# Patient Record
Sex: Female | Born: 1955
Health system: Southern US, Community
[De-identification: ages and names within clinical notes are randomized; demographics above are authoritative.]

## PROBLEM LIST (undated history)

## (undated) DIAGNOSIS — M722 Plantar fascial fibromatosis: Secondary | ICD-10-CM

## (undated) DIAGNOSIS — R5382 Chronic fatigue, unspecified: Secondary | ICD-10-CM

## (undated) DIAGNOSIS — F32A Depression, unspecified: Secondary | ICD-10-CM

## (undated) DIAGNOSIS — F329 Major depressive disorder, single episode, unspecified: Secondary | ICD-10-CM

## (undated) DIAGNOSIS — R609 Edema, unspecified: Secondary | ICD-10-CM

## (undated) DIAGNOSIS — R278 Other lack of coordination: Secondary | ICD-10-CM

## (undated) DIAGNOSIS — F419 Anxiety disorder, unspecified: Secondary | ICD-10-CM

## (undated) DIAGNOSIS — Z87442 Personal history of urinary calculi: Secondary | ICD-10-CM

## (undated) DIAGNOSIS — R251 Tremor, unspecified: Secondary | ICD-10-CM

## (undated) DIAGNOSIS — G9332 Myalgic encephalomyelitis/chronic fatigue syndrome: Secondary | ICD-10-CM

## (undated) DIAGNOSIS — G4733 Obstructive sleep apnea (adult) (pediatric): Secondary | ICD-10-CM

## (undated) DIAGNOSIS — R350 Frequency of micturition: Secondary | ICD-10-CM

## (undated) DIAGNOSIS — K219 Gastro-esophageal reflux disease without esophagitis: Secondary | ICD-10-CM

## (undated) DIAGNOSIS — K3184 Gastroparesis: Secondary | ICD-10-CM

## (undated) DIAGNOSIS — M810 Age-related osteoporosis without current pathological fracture: Secondary | ICD-10-CM

## (undated) DIAGNOSIS — N200 Calculus of kidney: Secondary | ICD-10-CM

## (undated) DIAGNOSIS — G629 Polyneuropathy, unspecified: Secondary | ICD-10-CM

## (undated) DIAGNOSIS — Z9889 Other specified postprocedural states: Secondary | ICD-10-CM

## (undated) DIAGNOSIS — A0472 Enterocolitis due to Clostridium difficile, not specified as recurrent: Secondary | ICD-10-CM

## (undated) DIAGNOSIS — R32 Unspecified urinary incontinence: Secondary | ICD-10-CM

## (undated) DIAGNOSIS — M5416 Radiculopathy, lumbar region: Secondary | ICD-10-CM

## (undated) DIAGNOSIS — M797 Fibromyalgia: Secondary | ICD-10-CM

## (undated) DIAGNOSIS — E785 Hyperlipidemia, unspecified: Secondary | ICD-10-CM

## (undated) DIAGNOSIS — M6289 Other specified disorders of muscle: Secondary | ICD-10-CM

## (undated) DIAGNOSIS — R202 Paresthesia of skin: Secondary | ICD-10-CM

## (undated) DIAGNOSIS — M5136 Other intervertebral disc degeneration, lumbar region: Secondary | ICD-10-CM

## (undated) DIAGNOSIS — J189 Pneumonia, unspecified organism: Secondary | ICD-10-CM

## (undated) DIAGNOSIS — G2581 Restless legs syndrome: Secondary | ICD-10-CM

## (undated) DIAGNOSIS — Z92241 Personal history of systemic steroid therapy: Secondary | ICD-10-CM

## (undated) DIAGNOSIS — R112 Nausea with vomiting, unspecified: Secondary | ICD-10-CM

## (undated) DIAGNOSIS — D649 Anemia, unspecified: Secondary | ICD-10-CM

## (undated) HISTORY — DX: Plantar fascial fibromatosis: M72.2

## (undated) HISTORY — DX: Hyperlipidemia, unspecified: E78.5

## (undated) HISTORY — PX: KNEE ARTHROPLASTY: SHX992

## (undated) HISTORY — DX: Anemia, unspecified: D64.9

## (undated) HISTORY — DX: Depression, unspecified: F32.A

## (undated) HISTORY — DX: Other specified postprocedural states: Z98.890

## (undated) HISTORY — DX: Age-related osteoporosis without current pathological fracture: M81.0

## (undated) HISTORY — DX: Obstructive sleep apnea (adult) (pediatric): G47.33

## (undated) HISTORY — DX: Other intervertebral disc degeneration, lumbar region: M51.36

## (undated) HISTORY — DX: Pneumonia, unspecified organism: J18.9

## (undated) HISTORY — DX: Other lack of coordination: R27.8

## (undated) HISTORY — DX: Polyneuropathy, unspecified: G62.9

## (undated) HISTORY — DX: Tremor, unspecified: R25.1

## (undated) HISTORY — DX: Fibromyalgia: M79.7

## (undated) HISTORY — DX: Edema, unspecified: R60.9

## (undated) HISTORY — DX: Gastroparesis: K31.84

## (undated) HISTORY — DX: Other specified disorders of muscle: M62.89

## (undated) HISTORY — DX: Anxiety disorder, unspecified: F41.9

## (undated) HISTORY — PX: TONSILLECTOMY: SUR1361

## (undated) HISTORY — DX: Personal history of systemic steroid therapy: Z92.241

## (undated) HISTORY — DX: Major depressive disorder, single episode, unspecified: F32.9

## (undated) HISTORY — DX: Enterocolitis due to Clostridium difficile, not specified as recurrent: A04.72

## (undated) HISTORY — DX: Gastro-esophageal reflux disease without esophagitis: K21.9

## (undated) HISTORY — DX: Restless legs syndrome: G25.81

## (undated) HISTORY — DX: Paresthesia of skin: R20.2

---
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
## 1997-11-26 HISTORY — PX: KNEE SURGERY: SHX244

## 1998-08-02 ENCOUNTER — Ambulatory Visit (HOSPITAL_BASED_OUTPATIENT_CLINIC_OR_DEPARTMENT_OTHER): Admission: RE | Admit: 1998-08-02 | Discharge: 1998-08-02 | Payer: Self-pay | Admitting: Pediatrics

## 1999-04-17 ENCOUNTER — Encounter: Payer: Self-pay | Admitting: Emergency Medicine

## 1999-04-17 ENCOUNTER — Inpatient Hospital Stay (HOSPITAL_COMMUNITY): Admission: EM | Admit: 1999-04-17 | Discharge: 1999-04-18 | Payer: Self-pay | Admitting: Emergency Medicine

## 1999-09-04 ENCOUNTER — Encounter: Admission: RE | Admit: 1999-09-04 | Discharge: 1999-09-04 | Payer: Self-pay | Admitting: Unknown Physician Specialty

## 2000-01-19 ENCOUNTER — Other Ambulatory Visit: Admission: RE | Admit: 2000-01-19 | Discharge: 2000-01-19 | Payer: Self-pay | Admitting: Family Medicine

## 2000-02-12 ENCOUNTER — Encounter: Payer: Self-pay | Admitting: Family Medicine

## 2000-02-12 ENCOUNTER — Encounter: Admission: RE | Admit: 2000-02-12 | Discharge: 2000-02-12 | Payer: Self-pay | Admitting: Family Medicine

## 2000-10-17 ENCOUNTER — Emergency Department (HOSPITAL_COMMUNITY): Admission: EM | Admit: 2000-10-17 | Discharge: 2000-10-17 | Payer: Self-pay | Admitting: Emergency Medicine

## 2001-02-27 ENCOUNTER — Other Ambulatory Visit: Admission: RE | Admit: 2001-02-27 | Discharge: 2001-02-27 | Payer: Self-pay | Admitting: *Deleted

## 2001-02-28 ENCOUNTER — Encounter: Payer: Self-pay | Admitting: Family Medicine

## 2001-02-28 ENCOUNTER — Encounter: Admission: RE | Admit: 2001-02-28 | Discharge: 2001-02-28 | Payer: Self-pay | Admitting: Family Medicine

## 2001-03-03 ENCOUNTER — Encounter: Admission: RE | Admit: 2001-03-03 | Discharge: 2001-03-03 | Payer: Self-pay | Admitting: Family Medicine

## 2001-03-03 ENCOUNTER — Encounter: Payer: Self-pay | Admitting: Family Medicine

## 2001-08-05 ENCOUNTER — Ambulatory Visit (HOSPITAL_BASED_OUTPATIENT_CLINIC_OR_DEPARTMENT_OTHER): Admission: RE | Admit: 2001-08-05 | Discharge: 2001-08-05 | Payer: Self-pay | Admitting: Family Medicine

## 2001-09-21 ENCOUNTER — Ambulatory Visit (HOSPITAL_BASED_OUTPATIENT_CLINIC_OR_DEPARTMENT_OTHER): Admission: RE | Admit: 2001-09-21 | Discharge: 2001-09-21 | Payer: Self-pay | Admitting: Family Medicine

## 2002-03-25 ENCOUNTER — Other Ambulatory Visit: Admission: RE | Admit: 2002-03-25 | Discharge: 2002-03-25 | Payer: Self-pay | Admitting: *Deleted

## 2002-04-01 ENCOUNTER — Encounter: Payer: Self-pay | Admitting: *Deleted

## 2002-04-01 ENCOUNTER — Encounter: Admission: RE | Admit: 2002-04-01 | Discharge: 2002-04-01 | Payer: Self-pay | Admitting: *Deleted

## 2002-12-23 ENCOUNTER — Encounter: Payer: Self-pay | Admitting: Family Medicine

## 2002-12-23 ENCOUNTER — Ambulatory Visit (HOSPITAL_COMMUNITY): Admission: RE | Admit: 2002-12-23 | Discharge: 2002-12-23 | Payer: Self-pay | Admitting: Family Medicine

## 2002-12-24 ENCOUNTER — Encounter: Admission: RE | Admit: 2002-12-24 | Discharge: 2002-12-24 | Payer: Self-pay | Admitting: Family Medicine

## 2002-12-24 ENCOUNTER — Encounter: Payer: Self-pay | Admitting: Family Medicine

## 2002-12-25 ENCOUNTER — Ambulatory Visit (HOSPITAL_COMMUNITY): Admission: RE | Admit: 2002-12-25 | Discharge: 2002-12-25 | Payer: Self-pay | Admitting: Family Medicine

## 2002-12-25 ENCOUNTER — Encounter: Payer: Self-pay | Admitting: Family Medicine

## 2003-03-26 ENCOUNTER — Encounter: Payer: Self-pay | Admitting: Gastroenterology

## 2003-03-26 ENCOUNTER — Ambulatory Visit (HOSPITAL_COMMUNITY): Admission: RE | Admit: 2003-03-26 | Discharge: 2003-03-26 | Payer: Self-pay | Admitting: Gastroenterology

## 2003-03-27 ENCOUNTER — Encounter: Payer: Self-pay | Admitting: Gastroenterology

## 2003-03-27 ENCOUNTER — Inpatient Hospital Stay (HOSPITAL_COMMUNITY): Admission: EM | Admit: 2003-03-27 | Discharge: 2003-04-05 | Payer: Self-pay | Admitting: Emergency Medicine

## 2003-03-30 ENCOUNTER — Encounter: Payer: Self-pay | Admitting: Gastroenterology

## 2003-04-01 ENCOUNTER — Encounter: Payer: Self-pay | Admitting: Gastroenterology

## 2003-04-10 ENCOUNTER — Inpatient Hospital Stay (HOSPITAL_COMMUNITY): Admission: EM | Admit: 2003-04-10 | Discharge: 2003-04-12 | Payer: Self-pay

## 2003-04-10 ENCOUNTER — Encounter: Payer: Self-pay | Admitting: Gastroenterology

## 2003-05-02 ENCOUNTER — Inpatient Hospital Stay (HOSPITAL_COMMUNITY): Admission: EM | Admit: 2003-05-02 | Discharge: 2003-05-06 | Payer: Self-pay | Admitting: *Deleted

## 2003-05-10 ENCOUNTER — Inpatient Hospital Stay (HOSPITAL_COMMUNITY): Admission: AD | Admit: 2003-05-10 | Discharge: 2003-05-17 | Payer: Self-pay | Admitting: Gastroenterology

## 2003-05-14 ENCOUNTER — Encounter: Payer: Self-pay | Admitting: Gastroenterology

## 2003-05-27 ENCOUNTER — Inpatient Hospital Stay (HOSPITAL_COMMUNITY): Admission: AD | Admit: 2003-05-27 | Discharge: 2003-05-30 | Payer: Self-pay | Admitting: Gastroenterology

## 2003-07-03 ENCOUNTER — Inpatient Hospital Stay (HOSPITAL_COMMUNITY): Admission: EM | Admit: 2003-07-03 | Discharge: 2003-07-06 | Payer: Self-pay | Admitting: Emergency Medicine

## 2003-07-13 ENCOUNTER — Encounter: Payer: Self-pay | Admitting: Family Medicine

## 2003-07-13 ENCOUNTER — Encounter: Admission: RE | Admit: 2003-07-13 | Discharge: 2003-07-13 | Payer: Self-pay | Admitting: Family Medicine

## 2003-07-23 ENCOUNTER — Encounter: Payer: Self-pay | Admitting: Family Medicine

## 2003-07-23 ENCOUNTER — Encounter: Admission: RE | Admit: 2003-07-23 | Discharge: 2003-07-23 | Payer: Self-pay | Admitting: Family Medicine

## 2003-12-21 ENCOUNTER — Inpatient Hospital Stay (HOSPITAL_COMMUNITY): Admission: EM | Admit: 2003-12-21 | Discharge: 2003-12-24 | Payer: Self-pay | Admitting: *Deleted

## 2004-01-10 ENCOUNTER — Inpatient Hospital Stay (HOSPITAL_COMMUNITY): Admission: AD | Admit: 2004-01-10 | Discharge: 2004-01-18 | Payer: Self-pay | Admitting: Internal Medicine

## 2004-02-17 ENCOUNTER — Other Ambulatory Visit: Admission: RE | Admit: 2004-02-17 | Discharge: 2004-02-17 | Payer: Self-pay | Admitting: *Deleted

## 2004-02-21 ENCOUNTER — Encounter: Admission: RE | Admit: 2004-02-21 | Discharge: 2004-02-21 | Payer: Self-pay | Admitting: Family Medicine

## 2004-02-24 ENCOUNTER — Encounter: Admission: RE | Admit: 2004-02-24 | Discharge: 2004-02-24 | Payer: Self-pay | Admitting: Family Medicine

## 2004-04-10 ENCOUNTER — Emergency Department (HOSPITAL_COMMUNITY): Admission: EM | Admit: 2004-04-10 | Discharge: 2004-04-10 | Payer: Self-pay | Admitting: Emergency Medicine

## 2004-04-11 ENCOUNTER — Inpatient Hospital Stay (HOSPITAL_COMMUNITY): Admission: EM | Admit: 2004-04-11 | Discharge: 2004-04-16 | Payer: Self-pay | Admitting: Emergency Medicine

## 2004-07-27 HISTORY — PX: CHOLECYSTECTOMY: SHX55

## 2004-07-31 ENCOUNTER — Emergency Department (HOSPITAL_COMMUNITY): Admission: EM | Admit: 2004-07-31 | Discharge: 2004-08-01 | Payer: Self-pay | Admitting: Emergency Medicine

## 2004-08-09 ENCOUNTER — Encounter: Admission: RE | Admit: 2004-08-09 | Discharge: 2004-08-09 | Payer: Self-pay | Admitting: Family Medicine

## 2004-08-09 ENCOUNTER — Observation Stay (HOSPITAL_COMMUNITY): Admission: EM | Admit: 2004-08-09 | Discharge: 2004-08-10 | Payer: Self-pay | Admitting: Emergency Medicine

## 2004-08-09 ENCOUNTER — Encounter (INDEPENDENT_AMBULATORY_CARE_PROVIDER_SITE_OTHER): Payer: Self-pay | Admitting: Specialist

## 2005-02-14 ENCOUNTER — Encounter: Admission: RE | Admit: 2005-02-14 | Discharge: 2005-03-20 | Payer: Self-pay | Admitting: Family Medicine

## 2005-02-21 ENCOUNTER — Other Ambulatory Visit: Admission: RE | Admit: 2005-02-21 | Discharge: 2005-02-21 | Payer: Self-pay | Admitting: *Deleted

## 2005-04-16 ENCOUNTER — Ambulatory Visit (HOSPITAL_COMMUNITY): Admission: RE | Admit: 2005-04-16 | Discharge: 2005-04-16 | Payer: Self-pay | Admitting: Gastroenterology

## 2005-07-01 ENCOUNTER — Observation Stay (HOSPITAL_COMMUNITY): Admission: EM | Admit: 2005-07-01 | Discharge: 2005-07-01 | Payer: Self-pay | Admitting: Emergency Medicine

## 2005-07-03 ENCOUNTER — Ambulatory Visit: Payer: Self-pay | Admitting: Hematology and Oncology

## 2005-07-05 ENCOUNTER — Ambulatory Visit (HOSPITAL_COMMUNITY): Admission: RE | Admit: 2005-07-05 | Discharge: 2005-07-05 | Payer: Self-pay | Admitting: Hematology and Oncology

## 2005-10-25 ENCOUNTER — Ambulatory Visit: Payer: Self-pay | Admitting: Hematology and Oncology

## 2005-10-30 ENCOUNTER — Encounter: Admission: RE | Admit: 2005-10-30 | Discharge: 2005-10-30 | Payer: Self-pay | Admitting: Family Medicine

## 2005-10-31 ENCOUNTER — Encounter (INDEPENDENT_AMBULATORY_CARE_PROVIDER_SITE_OTHER): Payer: Self-pay | Admitting: *Deleted

## 2005-10-31 ENCOUNTER — Other Ambulatory Visit: Admission: RE | Admit: 2005-10-31 | Discharge: 2005-10-31 | Payer: Self-pay | Admitting: Hematology and Oncology

## 2006-02-26 ENCOUNTER — Ambulatory Visit: Payer: Self-pay | Admitting: Hematology and Oncology

## 2006-02-26 LAB — CBC WITH DIFFERENTIAL/PLATELET
BASO%: 0.7 % (ref 0.0–2.0)
Basophils Absolute: 0.1 10*3/uL (ref 0.0–0.1)
EOS%: 2.2 % (ref 0.0–7.0)
Eosinophils Absolute: 0.2 10*3/uL (ref 0.0–0.5)
HCT: 35.4 % (ref 34.8–46.6)
HGB: 11.7 g/dL (ref 11.6–15.9)
LYMPH%: 21.6 % (ref 14.0–48.0)
MCH: 27.1 pg (ref 26.0–34.0)
MCHC: 33 g/dL (ref 32.0–36.0)
MCV: 82.1 fL (ref 81.0–101.0)
MONO#: 0.7 10*3/uL (ref 0.1–0.9)
MONO%: 9.5 % (ref 0.0–13.0)
NEUT#: 4.9 10*3/uL (ref 1.5–6.5)
NEUT%: 66 % (ref 39.6–76.8)
Platelets: 473 10*3/uL — ABNORMAL HIGH (ref 145–400)
RBC: 4.32 10*6/uL (ref 3.70–5.32)
RDW: 18 % — ABNORMAL HIGH (ref 11.3–14.5)
WBC: 7.5 10*3/uL (ref 3.9–10.0)
lymph#: 1.6 10*3/uL (ref 0.9–3.3)

## 2006-04-09 ENCOUNTER — Other Ambulatory Visit: Admission: RE | Admit: 2006-04-09 | Discharge: 2006-04-09 | Payer: Self-pay | Admitting: *Deleted

## 2006-07-26 ENCOUNTER — Ambulatory Visit: Payer: Self-pay | Admitting: Hematology and Oncology

## 2006-07-31 LAB — CBC WITH DIFFERENTIAL/PLATELET
BASO%: 0.1 % (ref 0.0–2.0)
Basophils Absolute: 0 10*3/uL (ref 0.0–0.1)
EOS%: 1.5 % (ref 0.0–7.0)
Eosinophils Absolute: 0.2 10*3/uL (ref 0.0–0.5)
HCT: 38.3 % (ref 34.8–46.6)
HGB: 12.7 g/dL (ref 11.6–15.9)
LYMPH%: 18.4 % (ref 14.0–48.0)
MCH: 27.6 pg (ref 26.0–34.0)
MCHC: 33 g/dL (ref 32.0–36.0)
MCV: 83.6 fL (ref 81.0–101.0)
MONO#: 1 10*3/uL — ABNORMAL HIGH (ref 0.1–0.9)
MONO%: 9 % (ref 0.0–13.0)
NEUT#: 7.6 10*3/uL — ABNORMAL HIGH (ref 1.5–6.5)
NEUT%: 71 % (ref 39.6–76.8)
Platelets: 499 10*3/uL — ABNORMAL HIGH (ref 145–400)
RBC: 4.59 10*6/uL (ref 3.70–5.32)
RDW: 18.9 % — ABNORMAL HIGH (ref 11.3–14.5)
WBC: 10.6 10*3/uL — ABNORMAL HIGH (ref 3.9–10.0)
lymph#: 2 10*3/uL (ref 0.9–3.3)

## 2006-07-31 LAB — COMPREHENSIVE METABOLIC PANEL
ALT: 17 U/L (ref 0–40)
AST: 16 U/L (ref 0–37)
Albumin: 4.1 g/dL (ref 3.5–5.2)
Alkaline Phosphatase: 81 U/L (ref 39–117)
BUN: 21 mg/dL (ref 6–23)
CO2: 23 mEq/L (ref 19–32)
Calcium: 8.9 mg/dL (ref 8.4–10.5)
Chloride: 105 mEq/L (ref 96–112)
Creatinine, Ser: 0.81 mg/dL (ref 0.40–1.20)
Glucose, Bld: 98 mg/dL (ref 70–99)
Potassium: 3.9 mEq/L (ref 3.5–5.3)
Sodium: 140 mEq/L (ref 135–145)
Total Bilirubin: 0.1 mg/dL — ABNORMAL LOW (ref 0.3–1.2)
Total Protein: 7.4 g/dL (ref 6.0–8.3)

## 2006-08-09 ENCOUNTER — Ambulatory Visit (HOSPITAL_COMMUNITY): Admission: RE | Admit: 2006-08-09 | Discharge: 2006-08-09 | Payer: Self-pay | Admitting: Gynecology

## 2006-08-09 ENCOUNTER — Encounter (INDEPENDENT_AMBULATORY_CARE_PROVIDER_SITE_OTHER): Payer: Self-pay | Admitting: Specialist

## 2006-08-09 DIAGNOSIS — Z9889 Other specified postprocedural states: Secondary | ICD-10-CM

## 2006-08-09 HISTORY — PX: ENDOMETRIAL ABLATION: SHX621

## 2006-08-09 HISTORY — DX: Other specified postprocedural states: Z98.890

## 2006-12-26 ENCOUNTER — Ambulatory Visit: Payer: Self-pay | Admitting: Hematology and Oncology

## 2006-12-31 LAB — CBC WITH DIFFERENTIAL/PLATELET
BASO%: 0.4 % (ref 0.0–2.0)
Basophils Absolute: 0 10*3/uL (ref 0.0–0.1)
EOS%: 2 % (ref 0.0–7.0)
Eosinophils Absolute: 0.2 10*3/uL (ref 0.0–0.5)
HCT: 37.1 % (ref 34.8–46.6)
HGB: 12.8 g/dL (ref 11.6–15.9)
LYMPH%: 20.3 % (ref 14.0–48.0)
MCH: 29 pg (ref 26.0–34.0)
MCHC: 34.5 g/dL (ref 32.0–36.0)
MCV: 84.1 fL (ref 81.0–101.0)
MONO#: 0.7 10*3/uL (ref 0.1–0.9)
MONO%: 8 % (ref 0.0–13.0)
NEUT#: 6.3 10*3/uL (ref 1.5–6.5)
NEUT%: 69.3 % (ref 39.6–76.8)
Platelets: 401 10*3/uL — ABNORMAL HIGH (ref 145–400)
RBC: 4.41 10*6/uL (ref 3.70–5.32)
RDW: 15.9 % — ABNORMAL HIGH (ref 11.3–14.5)
WBC: 9.1 10*3/uL (ref 3.9–10.0)
lymph#: 1.8 10*3/uL (ref 0.9–3.3)

## 2007-01-01 LAB — BASIC METABOLIC PANEL
BUN: 11 mg/dL (ref 6–23)
CO2: 27 mEq/L (ref 19–32)
Calcium: 8.9 mg/dL (ref 8.4–10.5)
Chloride: 102 mEq/L (ref 96–112)
Creatinine, Ser: 0.84 mg/dL (ref 0.40–1.20)
Glucose, Bld: 90 mg/dL (ref 70–99)
Potassium: 3.6 mEq/L (ref 3.5–5.3)
Sodium: 141 mEq/L (ref 135–145)

## 2007-01-01 LAB — JAK2 GENOTYPR

## 2007-06-04 ENCOUNTER — Encounter (HOSPITAL_COMMUNITY): Admission: RE | Admit: 2007-06-04 | Discharge: 2007-08-25 | Payer: Self-pay | Admitting: Neurology

## 2007-11-05 ENCOUNTER — Other Ambulatory Visit: Admission: RE | Admit: 2007-11-05 | Discharge: 2007-11-05 | Payer: Self-pay | Admitting: Family Medicine

## 2008-01-23 ENCOUNTER — Emergency Department (HOSPITAL_COMMUNITY): Admission: EM | Admit: 2008-01-23 | Discharge: 2008-01-23 | Payer: Self-pay | Admitting: Emergency Medicine

## 2008-06-23 ENCOUNTER — Inpatient Hospital Stay (HOSPITAL_COMMUNITY): Admission: EM | Admit: 2008-06-23 | Discharge: 2008-06-25 | Payer: Self-pay | Admitting: Emergency Medicine

## 2008-06-26 ENCOUNTER — Inpatient Hospital Stay (HOSPITAL_COMMUNITY): Admission: EM | Admit: 2008-06-26 | Discharge: 2008-06-29 | Payer: Self-pay | Admitting: Emergency Medicine

## 2008-08-02 ENCOUNTER — Emergency Department (HOSPITAL_COMMUNITY): Admission: EM | Admit: 2008-08-02 | Discharge: 2008-08-02 | Payer: Self-pay | Admitting: Emergency Medicine

## 2008-08-17 ENCOUNTER — Encounter (INDEPENDENT_AMBULATORY_CARE_PROVIDER_SITE_OTHER): Payer: Self-pay | Admitting: Gastroenterology

## 2008-08-17 ENCOUNTER — Inpatient Hospital Stay (HOSPITAL_COMMUNITY): Admission: EM | Admit: 2008-08-17 | Discharge: 2008-08-24 | Payer: Self-pay | Admitting: Emergency Medicine

## 2008-08-21 ENCOUNTER — Ambulatory Visit: Payer: Self-pay | Admitting: Gastroenterology

## 2008-10-10 ENCOUNTER — Inpatient Hospital Stay (HOSPITAL_COMMUNITY): Admission: EM | Admit: 2008-10-10 | Discharge: 2008-10-14 | Payer: Self-pay | Admitting: Emergency Medicine

## 2008-10-20 ENCOUNTER — Observation Stay (HOSPITAL_COMMUNITY): Admission: EM | Admit: 2008-10-20 | Discharge: 2008-10-22 | Payer: Self-pay | Admitting: Emergency Medicine

## 2009-01-11 ENCOUNTER — Ambulatory Visit: Payer: Self-pay | Admitting: Gynecology

## 2009-01-11 ENCOUNTER — Encounter: Payer: Self-pay | Admitting: Gynecology

## 2009-01-11 ENCOUNTER — Other Ambulatory Visit: Admission: RE | Admit: 2009-01-11 | Discharge: 2009-01-11 | Payer: Self-pay | Admitting: Gynecology

## 2009-04-04 LAB — HM DEXA SCAN: HM Dexa Scan: LOW

## 2009-09-23 ENCOUNTER — Encounter: Admission: RE | Admit: 2009-09-23 | Discharge: 2009-09-23 | Payer: Self-pay | Admitting: Family Medicine

## 2009-12-14 ENCOUNTER — Inpatient Hospital Stay (HOSPITAL_COMMUNITY): Admission: EM | Admit: 2009-12-14 | Discharge: 2009-12-19 | Payer: Self-pay | Admitting: Emergency Medicine

## 2009-12-14 ENCOUNTER — Ambulatory Visit: Payer: Self-pay | Admitting: Cardiology

## 2010-01-30 DIAGNOSIS — G43909 Migraine, unspecified, not intractable, without status migrainosus: Secondary | ICD-10-CM | POA: Insufficient documentation

## 2010-01-30 DIAGNOSIS — F411 Generalized anxiety disorder: Secondary | ICD-10-CM | POA: Insufficient documentation

## 2010-01-30 DIAGNOSIS — K219 Gastro-esophageal reflux disease without esophagitis: Secondary | ICD-10-CM | POA: Insufficient documentation

## 2010-01-30 DIAGNOSIS — K3184 Gastroparesis: Secondary | ICD-10-CM | POA: Insufficient documentation

## 2010-02-17 ENCOUNTER — Ambulatory Visit: Payer: Self-pay | Admitting: Cardiology

## 2010-02-17 DIAGNOSIS — R072 Precordial pain: Secondary | ICD-10-CM | POA: Insufficient documentation

## 2010-10-26 ENCOUNTER — Encounter: Admission: RE | Admit: 2010-10-26 | Discharge: 2010-10-26 | Payer: Self-pay | Admitting: Family Medicine

## 2010-10-26 LAB — HM MAMMOGRAPHY: HM Mammogram: NORMAL

## 2010-12-17 ENCOUNTER — Encounter: Payer: Self-pay | Admitting: Gastroenterology

## 2010-12-26 NOTE — Assessment & Plan Note (Signed)
Summary: nph/post cath 12/19/09/lg   Primary Provider:  Clovis Riley, NP   History of Present Illness: The patient presents for followup after hospitalization earlier this year for evaluation of chest discomfort. At that time she had a negative cardiac CT and a catheterization demonstrating normal coronary arteries. Since that time she has had no further chest discomfort. Apparently her discomfort was related to her gastroparesis. She has started a walking regimen. With this she is having no symptoms. She denies any chest pressure, neck or arm discomfort. She has no shortness of breath, PND or orthopnea.  Current Medications (verified): 1)  Effexor Xr 150 Mg Xr24h-Cap (Venlafaxine Hcl) .Marland Kitchen.. 1 By Mouth Two Times A Day 2)  Nexium 40 Mg Cpdr (Esomeprazole Magnesium) .Marland Kitchen.. 1 Pod Aily 3)  Topamax 50 Mg Tabs (Topiramate) .Marland Kitchen.. 1 By Mouth At Bedtime 4)  Triamterene-Hctz 37.5-25 Mg Tabs (Triamterene-Hctz) .... 1/2 By Mouth Daily 5)  Lyrica 75 Mg Caps (Pregabalin) .Marland Kitchen.. 1 By Mouth Three Times A Day 6)  Requip 0.5 Mg Tabs (Ropinirole Hcl) .Marland Kitchen.. 1-3 Hours At Bedtime 7)  Provigil 200 Mg Tabs (Modafinil) .Marland Kitchen.. 1 By Mouth Daily 8)  Crestor 10 Mg Tabs (Rosuvastatin Calcium) .Marland Kitchen.. 1 By Mouth Daily 9)  Zofran 4 Mg Tabs (Ondansetron Hcl) .... Two Times A Day 10)  Domperidone 10mg  .... 4 By Mouth Daily 11)  Promethazine Hcl 25 Mg Tabs (Promethazine Hcl) .... As Needed 12)  Lorazepam 0.5 Mg Tabs (Lorazepam) .... As Needed 13)  Lunesta 3 Mg Tabs (Eszopiclone) .Marland Kitchen.. 1 By Mouth At Bedtime 14)  Vitamin D3 2000 Unit Caps (Cholecalciferol) .Marland Kitchen.. 1 Po Daily 15)  Lovaza 1 Gm Caps (Omega-3-Acid Ethyl Esters) .... 2 By Mouth Two Times A Day  Allergies (verified): 1)  ! Erythromycin  Past History:  Past Medical History: Reviewed history from 01/30/2010 and no changes required.  1. Gastroesophageal reflux disease.   2. Fibromyalgia.   3. Migraine.   4. Gastroparesis.   5. Anxiety.   Past Surgical History: Reviewed  history from 01/30/2010 and no changes required.  1. Left knee surgery.   2. Cholecystectomy.   3. Tonsillectomy.   Review of Systems       As stated in the HPI and negative for all other systems.   Vital Signs:  Patient profile:   55 year old female Height:      62 inches Weight:      209 pounds BMI:     38.36 Pulse rate:   101 / minute Resp:     18 per minute BP sitting:   128 / 86  (right arm)  Vitals Entered By: Marrion Coy, CNA (February 17, 2010 10:20 AM)  Physical Exam  General:  Well developed, well nourished, in no acute distress. Head:  normocephalic and atraumatic Eyes:  PERRLA/EOM intact; conjunctiva and lids normal. Mouth:  Teeth, gums and palate normal. Oral mucosa normal. Neck:  Neck supple, no JVD. No masses, thyromegaly or abnormal cervical nodes. Chest Wall:  no deformities or breast masses noted Lungs:  Clear bilaterally to auscultation and percussion. Heart:  Non-displaced PMI, chest non-tender; regular rate and rhythm, S1, S2 without murmurs, rubs or gallops. Carotid upstroke normal, no bruit. Normal abdominal aortic size, no bruits. Femorals normal pulses, no bruits. Pedals normal pulses. No edema, no varicosities. Abdomen:  Bowel sounds positive; abdomen soft and non-tender without masses, organomegaly, or hernias noted. No hepatosplenomegaly. Msk:  Back normal, normal gait. Muscle strength and tone normal. Extremities:  No clubbing or cyanosis.  Neurologic:  Alert and oriented x 3. Skin:  Intact without lesions or rashes. Psych:  Normal affect.   Impression & Recommendations:  Problem # 1:  PRECORDIAL PAIN (ICD-786.51) The patient has had no further chest discomfort. She has normal coronary arteries on catheterization. At this point no further cardiovascular testing is suggested. Orders: EKG w/ Interpretation (93000)  Problem # 2:  OVERWEIGHT (ICD-278.02) The patient and I discussed at length weight loss strategies.  Patient Instructions: 1)   Your physician recommends that you schedule a follow-up appointment as needed 2)  Your physician recommends that you continue on your current medications as directed. Please refer to the Current Medication list given to you today.

## 2011-01-16 ENCOUNTER — Other Ambulatory Visit (HOSPITAL_COMMUNITY)
Admission: RE | Admit: 2011-01-16 | Discharge: 2011-01-16 | Disposition: A | Discharge status: Home / Self Care | Payer: 59 | Source: Ambulatory Visit | Attending: Family Medicine | Admitting: Family Medicine

## 2011-01-16 ENCOUNTER — Other Ambulatory Visit: Payer: Self-pay | Admitting: Family Medicine

## 2011-01-16 DIAGNOSIS — Z124 Encounter for screening for malignant neoplasm of cervix: Secondary | ICD-10-CM | POA: Insufficient documentation

## 2011-01-16 LAB — HM PAP SMEAR: HM Pap smear: NORMAL

## 2011-02-11 LAB — COMPREHENSIVE METABOLIC PANEL
ALT: 37 U/L — ABNORMAL HIGH (ref 0–35)
ALT: 39 U/L — ABNORMAL HIGH (ref 0–35)
AST: 23 U/L (ref 0–37)
AST: 71 U/L — ABNORMAL HIGH (ref 0–37)
Albumin: 3.1 g/dL — ABNORMAL LOW (ref 3.5–5.2)
Albumin: 3.3 g/dL — ABNORMAL LOW (ref 3.5–5.2)
Alkaline Phosphatase: 85 U/L (ref 39–117)
Alkaline Phosphatase: 97 U/L (ref 39–117)
BUN: 13 mg/dL (ref 6–23)
BUN: 9 mg/dL (ref 6–23)
CO2: 24 mEq/L (ref 19–32)
CO2: 27 mEq/L (ref 19–32)
Calcium: 8.4 mg/dL (ref 8.4–10.5)
Calcium: 8.7 mg/dL (ref 8.4–10.5)
Chloride: 104 mEq/L (ref 96–112)
Chloride: 108 mEq/L (ref 96–112)
Creatinine, Ser: 0.82 mg/dL (ref 0.4–1.2)
Creatinine, Ser: 0.83 mg/dL (ref 0.4–1.2)
GFR calc Af Amer: >60 mL/min (ref >60)
GFR calc Af Amer: >60 mL/min (ref >60)
GFR calc non Af Amer: >60 mL/min (ref >60)
GFR calc non Af Amer: >60 mL/min (ref >60)
Glucose, Bld: 129 mg/dL — ABNORMAL HIGH (ref 70–99)
Glucose, Bld: 90 mg/dL (ref 70–99)
Potassium: 3.5 mEq/L (ref 3.5–5.1)
Potassium: 5.8 mEq/L — ABNORMAL HIGH (ref 3.5–5.1)
Sodium: 138 mEq/L (ref 135–145)
Sodium: 139 mEq/L (ref 135–145)
Total Bilirubin: 0.3 mg/dL (ref 0.3–1.2)
Total Bilirubin: 1.3 mg/dL — ABNORMAL HIGH (ref 0.3–1.2)
Total Protein: 6.4 g/dL (ref 6.0–8.3)
Total Protein: 6.5 g/dL (ref 6.0–8.3)

## 2011-02-11 LAB — CARDIAC PANEL(CRET KIN+CKTOT+MB+TROPI)
CK, MB: 1.4 ng/mL (ref 0.3–4.0)
CK, MB: 1.4 ng/mL (ref 0.3–4.0)
CK, MB: 1.6 ng/mL (ref 0.3–4.0)
Relative Index: INVALID (ref 0.0–2.5)
Relative Index: INVALID (ref 0.0–2.5)
Relative Index: INVALID (ref 0.0–2.5)
Total CK: 62 U/L (ref 7–177)
Total CK: 65 U/L (ref 7–177)
Total CK: 82 U/L (ref 7–177)
Troponin I: 0.02 ng/mL (ref 0.00–0.06)
Troponin I: 0.02 ng/mL (ref 0.00–0.06)
Troponin I: 0.04 ng/mL (ref 0.00–0.06)

## 2011-02-11 LAB — BASIC METABOLIC PANEL
BUN: 10 mg/dL (ref 6–23)
BUN: 11 mg/dL (ref 6–23)
BUN: 12 mg/dL (ref 6–23)
BUN: 13 mg/dL (ref 6–23)
CO2: 25 mEq/L (ref 19–32)
CO2: 27 mEq/L (ref 19–32)
CO2: 29 mEq/L (ref 19–32)
CO2: 31 mEq/L (ref 19–32)
Calcium: 8.1 mg/dL — ABNORMAL LOW (ref 8.4–10.5)
Calcium: 8.3 mg/dL — ABNORMAL LOW (ref 8.4–10.5)
Calcium: 8.3 mg/dL — ABNORMAL LOW (ref 8.4–10.5)
Calcium: 8.3 mg/dL — ABNORMAL LOW (ref 8.4–10.5)
Chloride: 102 mEq/L (ref 96–112)
Chloride: 104 mEq/L (ref 96–112)
Chloride: 105 mEq/L (ref 96–112)
Chloride: 110 mEq/L (ref 96–112)
Creatinine, Ser: 0.75 mg/dL (ref 0.4–1.2)
Creatinine, Ser: 0.79 mg/dL (ref 0.4–1.2)
Creatinine, Ser: 0.82 mg/dL (ref 0.4–1.2)
Creatinine, Ser: 0.93 mg/dL (ref 0.4–1.2)
GFR calc Af Amer: >60 mL/min (ref >60)
GFR calc Af Amer: >60 mL/min (ref >60)
GFR calc Af Amer: >60 mL/min (ref >60)
GFR calc Af Amer: >60 mL/min (ref >60)
GFR calc non Af Amer: >60 mL/min (ref >60)
GFR calc non Af Amer: >60 mL/min (ref >60)
GFR calc non Af Amer: >60 mL/min (ref >60)
GFR calc non Af Amer: >60 mL/min (ref >60)
Glucose, Bld: 109 mg/dL — ABNORMAL HIGH (ref 70–99)
Glucose, Bld: 113 mg/dL — ABNORMAL HIGH (ref 70–99)
Glucose, Bld: 90 mg/dL (ref 70–99)
Glucose, Bld: 99 mg/dL (ref 70–99)
Potassium: 3.6 mEq/L (ref 3.5–5.1)
Potassium: 3.7 mEq/L (ref 3.5–5.1)
Potassium: 3.8 mEq/L (ref 3.5–5.1)
Potassium: 4.1 mEq/L (ref 3.5–5.1)
Sodium: 138 mEq/L (ref 135–145)
Sodium: 140 mEq/L (ref 135–145)
Sodium: 141 mEq/L (ref 135–145)
Sodium: 142 mEq/L (ref 135–145)

## 2011-02-11 LAB — TROPONIN I: Troponin I: 0.01 ng/mL (ref 0.00–0.06)

## 2011-02-11 LAB — URINALYSIS, ROUTINE W REFLEX MICROSCOPIC
Bilirubin Urine: NEGATIVE
Glucose, UA: NEGATIVE mg/dL
Hgb urine dipstick: NEGATIVE
Ketones, ur: NEGATIVE mg/dL
Nitrite: NEGATIVE
Protein, ur: NEGATIVE mg/dL
Specific Gravity, Urine: >1.046 — ABNORMAL HIGH (ref 1.005–1.030)
Urobilinogen, UA: 0.2 mg/dL (ref 0.0–1.0)
pH: 5.5 (ref 5.0–8.0)

## 2011-02-11 LAB — CBC
HCT: 38.9 % (ref 36.0–46.0)
HCT: 40.8 % (ref 36.0–46.0)
Hemoglobin: 13.4 g/dL (ref 12.0–15.0)
Hemoglobin: 13.6 g/dL (ref 12.0–15.0)
MCHC: 33.3 g/dL (ref 30.0–36.0)
MCHC: 34.3 g/dL (ref 30.0–36.0)
MCV: 88.8 fL (ref 78.0–100.0)
MCV: 90 fL (ref 78.0–100.0)
Platelets: 345 10*3/uL (ref 150–400)
Platelets: 388 10*3/uL (ref 150–400)
RBC: 4.38 MIL/uL (ref 3.87–5.11)
RBC: 4.54 MIL/uL (ref 3.87–5.11)
RDW: 14.7 % (ref 11.5–15.5)
RDW: 14.8 % (ref 11.5–15.5)
WBC: 11.6 10*3/uL — ABNORMAL HIGH (ref 4.0–10.5)
WBC: 12.2 10*3/uL — ABNORMAL HIGH (ref 4.0–10.5)

## 2011-02-11 LAB — HEPATIC FUNCTION PANEL
ALT: 60 U/L — ABNORMAL HIGH (ref 0–35)
AST: 54 U/L — ABNORMAL HIGH (ref 0–37)
Albumin: 3.2 g/dL — ABNORMAL LOW (ref 3.5–5.2)
Alkaline Phosphatase: 103 U/L (ref 39–117)
Bilirubin, Direct: 0.2 mg/dL (ref 0.0–0.3)
Indirect Bilirubin: 0.4 mg/dL (ref 0.3–0.9)
Total Bilirubin: 0.6 mg/dL (ref 0.3–1.2)
Total Protein: 6.7 g/dL (ref 6.0–8.3)

## 2011-02-11 LAB — POCT CARDIAC MARKERS
CKMB, poc: 1.1 ng/mL (ref 1.0–8.0)
CKMB, poc: 1.5 ng/mL (ref 1.0–8.0)
Myoglobin, poc: 37.5 ng/mL (ref 12–200)
Myoglobin, poc: 41.5 ng/mL (ref 12–200)
Troponin i, poc: <0.05 ng/mL (ref 0.00–0.09)
Troponin i, poc: <0.05 ng/mL (ref 0.00–0.09)

## 2011-02-11 LAB — LIPID PANEL
Cholesterol: 194 mg/dL (ref 0–200)
HDL: 55 mg/dL (ref >39)
LDL Cholesterol: 120 mg/dL — ABNORMAL HIGH (ref 0–99)
Total CHOL/HDL Ratio: 3.5 RATIO
Triglycerides: 96 mg/dL (ref <150)
VLDL: 19 mg/dL (ref 0–40)

## 2011-02-11 LAB — LIPASE, BLOOD: Lipase: 27 U/L (ref 11–59)

## 2011-02-11 LAB — CK TOTAL AND CKMB (NOT AT ARMC)
CK, MB: 1.3 ng/mL (ref 0.3–4.0)
Relative Index: INVALID (ref 0.0–2.5)
Total CK: 35 U/L (ref 7–177)

## 2011-02-11 LAB — POTASSIUM: Potassium: 3.8 mEq/L (ref 3.5–5.1)

## 2011-02-11 LAB — APTT: aPTT: 34 seconds (ref 24–37)

## 2011-02-11 LAB — PROTIME-INR
INR: 1 (ref 0.00–1.49)
Prothrombin Time: 13.1 seconds (ref 11.6–15.2)

## 2011-02-11 LAB — DIFFERENTIAL
Basophils Absolute: 0 10*3/uL (ref 0.0–0.1)
Basophils Relative: 0 % (ref 0–1)
Eosinophils Absolute: 0.2 10*3/uL (ref 0.0–0.7)
Eosinophils Relative: 1 % (ref 0–5)
Lymphocytes Relative: 18 % (ref 12–46)
Lymphs Abs: 2.1 10*3/uL (ref 0.7–4.0)
Monocytes Absolute: 1 10*3/uL (ref 0.1–1.0)
Monocytes Relative: 9 % (ref 3–12)
Neutro Abs: 8.3 10*3/uL — ABNORMAL HIGH (ref 1.7–7.7)
Neutrophils Relative %: 72 % (ref 43–77)

## 2011-02-11 LAB — TSH: TSH: 2.128 u[IU]/mL (ref 0.350–4.500)

## 2011-02-25 DIAGNOSIS — M722 Plantar fascial fibromatosis: Secondary | ICD-10-CM

## 2011-02-25 HISTORY — DX: Plantar fascial fibromatosis: M72.2

## 2011-03-28 ENCOUNTER — Institutional Professional Consult (permissible substitution) (INDEPENDENT_AMBULATORY_CARE_PROVIDER_SITE_OTHER): Payer: 59 | Admitting: Family Medicine

## 2011-03-28 DIAGNOSIS — E7800 Pure hypercholesterolemia, unspecified: Secondary | ICD-10-CM

## 2011-03-28 DIAGNOSIS — IMO0001 Reserved for inherently not codable concepts without codable children: Secondary | ICD-10-CM

## 2011-03-28 DIAGNOSIS — K3184 Gastroparesis: Secondary | ICD-10-CM

## 2011-04-02 ENCOUNTER — Ambulatory Visit (INDEPENDENT_AMBULATORY_CARE_PROVIDER_SITE_OTHER): Payer: 59 | Admitting: Family Medicine

## 2011-04-02 DIAGNOSIS — J309 Allergic rhinitis, unspecified: Secondary | ICD-10-CM

## 2011-04-02 DIAGNOSIS — J069 Acute upper respiratory infection, unspecified: Secondary | ICD-10-CM

## 2011-04-06 ENCOUNTER — Telehealth: Payer: Self-pay | Admitting: Family Medicine

## 2011-04-06 NOTE — Telephone Encounter (Signed)
I called and spoke to pt.  She has had no productive sputum, just chills, cough, chest no worse congested , but a little SOB.  No hx/o lung or heart disease, non smoker.  She is using OTC Mucinex DM.  She has not started antibiotic.  I advised if worse chest congestion or sputum, begin antibiotic prescribed by Dr. Lynelle Doctor, hydrate well, rest, c/t Mucinex DM.  If SOB worsens, return or go to Urgent Care or Emergency Dept.

## 2011-04-06 NOTE — Telephone Encounter (Signed)
SAW KNAPP MON. 5/7. GIVEN SCRIPT FOR AMOXICILLIN TO TAKE ONLY IF PHLEGM/MUCUS START TO GET YELLOW. IT HAS NOT HOWEVER SHE HAD CHILLS TO THE POINT OF CHATTERING TEETH, SWEATING. FELT LIKE FLU SO TOOK THERAFLU. NOW EXTREMELY SHORT OF BREATH AND KEEP GETTING CHILLS AND SWEATING. SHOULD SHE START THE AMOXICILLIN NOW FOR THESE SYMPTOMS?

## 2011-04-07 ENCOUNTER — Inpatient Hospital Stay (INDEPENDENT_AMBULATORY_CARE_PROVIDER_SITE_OTHER)
Admission: RE | Admit: 2011-04-07 | Discharge: 2011-04-07 | Disposition: A | Discharge status: Home / Self Care | Payer: 59 | Source: Ambulatory Visit | Attending: Emergency Medicine | Admitting: Emergency Medicine

## 2011-04-07 ENCOUNTER — Ambulatory Visit (INDEPENDENT_AMBULATORY_CARE_PROVIDER_SITE_OTHER): Payer: 59

## 2011-04-07 DIAGNOSIS — J189 Pneumonia, unspecified organism: Secondary | ICD-10-CM

## 2011-04-09 ENCOUNTER — Encounter: Payer: Self-pay | Admitting: Medical

## 2011-04-09 ENCOUNTER — Ambulatory Visit (INDEPENDENT_AMBULATORY_CARE_PROVIDER_SITE_OTHER): Payer: 59 | Admitting: Medical

## 2011-04-09 VITALS — BP 110/72 | HR 103 | Ht 62.5 in | Wt 211.0 lb

## 2011-04-09 DIAGNOSIS — J189 Pneumonia, unspecified organism: Secondary | ICD-10-CM

## 2011-04-09 DIAGNOSIS — R7301 Impaired fasting glucose: Secondary | ICD-10-CM

## 2011-04-09 DIAGNOSIS — E860 Dehydration: Secondary | ICD-10-CM

## 2011-04-09 DIAGNOSIS — R0602 Shortness of breath: Secondary | ICD-10-CM

## 2011-04-09 LAB — POCT GLYCOSYLATED HEMOGLOBIN (HGB A1C): Hemoglobin A1C: 5.6

## 2011-04-09 MED ORDER — PREDNISONE (PAK) 10 MG PO TABS: 10.0000 mg | ORAL_TABLET | Freq: Every day | ORAL | Status: AC

## 2011-04-09 MED ORDER — ALBUTEROL SULFATE (2.5 MG/3ML) 0.083% IN NEBU: 2.5000 mg | INHALATION_SOLUTION | Freq: Four times a day (QID) | RESPIRATORY_TRACT | Status: DC | PRN

## 2011-04-09 MED ORDER — COMPRESSOR/NEBULIZER MISC: Status: DC

## 2011-04-09 NOTE — Progress Notes (Signed)
Subjective:   HPI Here for recheck. Was seen here on 04/02/11 for cough. Over the weekend was having increased shortness of breath, went to urgent care, diagnosed with pneumonia. She was given a nebulizer treatment at urgent care with some improvement. Was sent home with a short course of prednisone 50 mg one tablet daily for 2 days then one half tablet daily for 2 days, albuterol HFA, and doxycycline 100 mg twice a day for 7 days. She is not using the prednisone correctly, nevertheless she does not feel much improved yet, is fatigued, coughing a lot, has had minimal improvement. She is here with her husband today. No other new complaints.   Review of Systems Constitutional: Positive for fatigue, chills; denies fever, sweats, unexpected weight change Dermatology: denies rash  ENT: Positive for chest congestion; no runny nose, ear pain, sore throat, hoarseness, sinus pain Cardiology: denies chest pain, palpitations, edema, orthopnea, paroxysmal nocturnal dyspnea Respiratory: Positive for cough, shortness of breath, dyspnea on exertion, wheezing; no hemoptysis Gastroenterology: Positive for nausea to make a denies abdominal pain, vomiting, diarrhea Hematology: denies bleeding or bruising problems Musculoskeletal: denies arthralgias, myalgias, joint swelling, back pain, neck pain, cramping, gait changes Urology: denies dysuria, difficulty urinating, hematuria, urinary frequency, urgency Neurology: no headache, weakness, tingling, numbness     Objective:   Physical Exam  General appearance: alert, no distress, WD/WN, female , overweight, fatigued-appearing  HEENT: normocephalic, conjunctiva/corneas normal, sclerae anicteric, PERRLA, EOMi, nares patent, no discharge or erythema, pharynx normal Oral cavity: Somewhat dry mucous membranes, tongue normal, teeth normal Neck: supple, no lymphadenopathy, no thyromegaly, no masses Heart: RRR, normal S1, S2, no murmurs Lungs: Decreased breath sounds,  positive scattered rhonchi; otherwise CTA bilaterally, no wheezes, or rales Extremities: no edema, no cyanosis, no clubbing Pulses: 2+ symmetric, upper and lower extremities   Assessment & Plan:     Pneumonia-continue doxycycline, gave her a prescription for prednisone today, rest, discussed the usual course of pneumonia, symptoms that would prompt hospitalization, treatment, timeframe for recovery. Reviewed urgent care documents that she brought. Gave one round of albuterol nebulized treatment in the office with improvement. With little activity of walking she did not desaturate to less than 96%.  Shortness of breath-new prescription for albuterol nebulizer, advise rest, if not improving call or return.  Dehydration-discussed importance of hydration , advised if she drink 20 ounces of liquids, water or Gatorade until urine is clear  Call or return if not improved or worse in the next one to 2 days. Recheck in one week.

## 2011-04-09 NOTE — Patient Instructions (Addendum)
Pneumonia Pneumonia is an infection of the lungs. It may be caused by a bacteria or virus. Most forms are bacterial. Usually, these infections are caused by breathing infectious particles into the lungs (respiratory tract). SYMPTOMS  The most common problems (symptoms) are:   Cough.  Fever.   Chest pain.  Increased rate of breathing.   Wheezing.  Mucus production.   DIAGNOSIS  Often these infections are diagnosed on exam by your caregiver. Sometimes the diagnosis may require:   Chest X-rays.   Blood analysis.   Cultures. Blood cultures may be done to help find the cause of your pneumonia.  Your caregiver may do tests (blood gasses or pulse oximetry) to see how well your lungs are working. TREATMENT The bacterial pneumonias generally respond well to medicines (antibiotics) that kill germs. Viral infections must run their course. These infections will not respond to antibiotics. A pneumococcal shot (vaccine) is available to prevent a common bacterial pneumonia. This is usually suggested for the elderly and for other groups of higher risk individuals, such as those on chemotherapy or those who have problems with their immune system.  You will have pneumococcal screening or vaccination if you are over 22 years old and are not immunized.   If you are a smoker, it is time to quit. You may receive instructions on how to best stop smoking. Your caregiver can provide medicines and counseling to help you quit.  HOME CARE INSTRUCTIONS  Cough suppressants may be used if you are losing too much rest. However, coughing protects you by clearing your lungs. This is one reason for not using cough suppressants, if able, as they take away this protection.   Your caregiver may have prescribed an antibiotic if she or he feels your cough is caused by a bacterial infection. Take all your medicine until you are finished.   Your caregiver may also prescribe an expectorant to loosen the mucus to be coughed  up.   Only take over-the-counter or prescription medicines for pain, discomfort, or fever as directed by your caregiver.   Smoking is a common cause of bronchitis and can contribute to pneumonia. Stopping this habit is an important self-help step.   If you are a smoker and continue to smoke, your cough may last several weeks after your pneumonia has cleared.   A cold steam vaporizer or humidifier in your room or home may help loosen mucus.   Coughing is often worse at night. Sleeping in a semi-upright position in a recliner or using a couple pillows under your head will help with this.   Get rest as you feel it is needed. Your body will usually let you know when to rest.  SEEK IMMEDIATE MEDICAL CARE IF:  You develop pus-like mucus (sputum) or your illness becomes worse. This is especially true if you are elderly or weakened from any other disease.   You cannot control your cough with suppressants and are losing sleep.   You begin coughing up blood.   You develop pain which is getting worse or is uncontrolled with medicines.   You or your child has an oral temperature above 101, not controlled by medicine.   Any of the symptoms which initially brought you in for treatment are getting worse rather than better.   You develop shortness of breath or chest pain.  MAKE SURE YOU:   Understand these instructions.   Will watch your condition.   Will get help right away if you are not doing well or  get worse.  Document Released: 11/12/2005 Document Re-Released: 05/02/2010 [facility] Patient Information 2011 [place], [place].     Dehydration Dehydration is the reduction of water and fluid from the body to a level below that required for proper functioning. CAUSES Dehydration occurs when there is excessive fluid loss from the body or when loss of normal fluids is not adequately replaced.  Loss of fluids occurs in vomiting, diarrhea, excessive sweating, excessive urine output, or  excessive loss of fluid from the lungs (as occurs in fever or in patients on a ventilator).   Inadequate fluid replacement occurs with nausea or decreased appetite due to illness, sore throat, or mouth pain.  SYMPTOMS Mild dehydration  Thirst (infants and young children may not be able to tell you they are thirsty).   Dry lips.   Slightly dry mouth membranes.  Moderate dehydration  Very dry mouth membranes.   Sunken eyes.   Sunken soft spot (fontanelle) on infant's head.   Skin does not bounce back quickly when lightly pinched and released.   Decreased urine production.   Decreased tear production.  Severe dehydration  Rapid, weak pulse (more than 100 beats per minute at rest).   Cold hands and feet.   Loss of ability to sweat in spite of heat and temperature.   Rapid breathing.   Blue lips.   Confusion, lethargy, difficult to arouse.   Minimal urine production.   No tears.  DIAGNOSIS Your caregiver will diagnose dehydration based on your symptoms and your exam. Blood and urine tests will help confirm the diagnosis. The diagnostic evaluation should also identify the cause of dehydration. PREVENTION The body depends on a proper balance of fluid and salts (electrolytes) for normal function. Adequate fluid intake in the presence of illness or other stresses (such as extreme exercise) is important.  TREATMENT  Mild dehydration is safe to self-treat for most ages as long as it does not worsen. Contact your caregiver for even mild dehydration in infants and the elderly.   In teenagers and adults with moderate dehydration, careful home treatment (as outlined below) can be safe. Phone contact with a caregiver is advised. Children under 45 years of age with moderate dehydration should see a caregiver.   If you or your child is severely dehydrated, go to a hospital for treatment. Intravenous (IV) fluids will quickly reverse dehydration and are often lifesaving in young  children, infants, and elderly persons.  HOME CARE INSTRUCTIONS Small amounts of fluids should be taken frequently. Large amounts at one time may not be tolerated. Plain water may be harmful in infants and the elderly. Oral rehydration solutions (ORS) are available at pharmacies and grocery stores. ORS replaces water and important electrolytes in proper proportions. Sports drinks are not as effective as ORS and may be harmful because the sugar can make diarrhea worse.  As a general guideline for children, replace any new fluid losses from diarrhea and/or vomiting with ORS as follows:   If your child weighs 22 pounds or under (10 kg or less), give 60-120 mL (1/4-1/2 cup or 2-4 ounces) of ORS for each diarrheal stool or vomiting episode.   If your child weighs more than 22 pounds (more than 10 kg), give 120-240 mL (1/2-1 cup or 4-8 ounces) of ORS for each diarrheal stool or vomiting episode.  If your child is vomiting, it may be helpful to give the above ORS replacement in 5 mL (1 teaspoon) amounts every 5 minutes and increase as tolerated.   While correcting  for dehydration, children should eat normally. However, foods high in sugar should be avoided because they may worsen diarrhea. Large amounts of carbonated soft drinks, juice, gelatin desserts, and other highly sugared drinks should be avoided.   After correction of dehydration, other liquids that are appealing to the child may be added. Children should drink small amounts of fluids frequently and fluids should be increased as tolerated. Children should drink enough fluids to keep urine clear or pale yellow.  Adults should eat normally while drinking more fluids than usual. Drink small amounts of fluids frequently and increase the amount as tolerated. Drink enough fluids to keep urine clear or pale yellow. Broths, weak decaffeinated tea, lemon-lime soft drinks (allowed to go flat), and ORS replace fluids and electrolytes.  Avoid:  Carbonated  drinks.  Juice.   Extremely hot or cold fluids.   Caffeine drinks.   Fatty, greasy foods.   Alcohol.  Tobacco.   Too much intake of anything at one time.   Gelatin desserts.    Probiotics are active cultures of beneficial bacteria. They may lessen the amount and number of diarrheal stools in adults. Probiotics can be found in yogurt with active cultures and in supplements.   Wash your hands well to avoid spreading germs (bacteria) and viruses.   Antidiarrheal medicines are not recommended for infants and children.   Only take over-the-counter or prescription medicines for pain, discomfort, or fever as directed by your caregiver. Do not give aspirin to children.   For adults with dehydration, ask your caregiver if you should continue all prescribed and over-the-counter medicines.   If your caregiver has given you a follow-up appointment, it is very important to keep that appointment. Not keeping the appointment could result in a lasting (chronic) or permanent injury and disability. If there is any problem keeping the appointment, you must call to reschedule.  SEEK IMMEDIATE MEDICAL CARE IF:  You are unable to keep fluids down or other symptoms become worse despite treatment.   Vomiting or diarrhea develops and becomes persistent.   There is vomiting of blood or green matter (bile).   There is blood in the stool or the stools are black and tarry.   There is no urine output in 6 to 8 hours or there is only a small amount of very dark urine.   Abdominal pain develops, increases, or localizes.   You or your child has an oral temperature above 101 not controlled by medicine.   Your baby is older than 3 months with a rectal temperature of 102.69F (38.9 C) or higher.   Your baby is 72 months old or younger with a rectal temperature of 100.4 F (38 C) or higher.   You develop excessive weakness, dizziness, fainting, or extreme thirst.   You develop a rash, stiff neck, severe  headache, or you become irritable, sleepy, or difficult to awaken.  MAKE SURE YOU:  Understand these instructions.   Will watch your condition.   Will get help right away if you are not doing well or get worse.  Document Released: 11/12/2005 Document Re-Released: 02/06/2010 [facility] Patient Information 2011 [place], [place].

## 2011-04-10 NOTE — H&P (Signed)
NAMEKYLANI, [name] NO.:  [id number]   MEDICAL RECORD NO.:  [id number]          PATIENT TYPE:  EMS   LOCATION:  ED                           FACILITY:  [facility]   PHYSICIAN:  Ramiro Harvest, MD    DATE OF BIRTH:  06/10/1956   DATE OF ADMISSION:  10/10/2008  DATE OF DISCHARGE:                              HISTORY & PHYSICAL   ATTENDING PHYSICIAN:  Dr. Ramiro Harvest.  The patient's primary care  physician is Clovis Riley, Nurse Practitioner at [facility] under  Dr. Abigail Miyamoto of [facility].  The patient's gastroenterologist is  Dr. Charna Elizabeth.   HPI:  [name] is a 55 year old white female with a history of  fibromyalgia, anxiety, depression gastroparesis, gastroesophageal reflux  disease with multiple hospitalizations for vomiting and diarrhea, her  last hospitalization being August 17, 2008 to August 24, 2008 who  presented to the ED with a 3-day history of emesis, nausea, diarrhea,  decreased p.o. intake and generalized weakness.  The patient denies any  fevers no chills, no chest pain or shortness of breath, no abdominal  pain, no constipation.  No cough, no melena or hematemesis.  No  hematochezia.  No recent travel, no change in dietary habits.  No focal  neurological symptoms.  The patient as stated was recently started on  domperidone 5 days prior to admission. The patient denies any other  associated symptoms.  The patient was seen in the ED. CMET obtained  showed a potassium of 3.1, bicarb of 17, glucose of 127, albumin of 3.2,  otherwise was within normal limits.  CBC with a white count of 21.9,  otherwise was within normal limits.  Acute abdominal series was  negative.  UA with moderate bilirubin, ketones greater than 80, trace  leukocytes.  Micro WBC 0-2.  The patient was given some Reglan in the  ED. However, the patient still with dry heaves, we were called to admit  the patient for further evaluation and management.   ALLERGIES:  ERYTHROMYCIN causes GI symptoms.   PAST MEDICAL HISTORY:  1. Fibromyalgia.  2. Gastroesophageal reflux disease.  3. Anxiety.  4. Depression.  5. Gastroparesis.  6. Migraines.  7. Status post right knee arthroscopy.  8. History of peripheral neuropathy.  9. Restless leg syndrome.  10.Status post cholecystectomy August 09, 2004 per Dr. Derrell Lolling.  11.Status post tonsillectomy and adenoidectomy.   HOME MEDICATIONS:  1. Effexor 150 mg p.o. b.i.d.  2. Nexium 40 mg p.o. daily.  3. Alprazolam 0.25 mg half to 1 tablet p.r.n.  4. Topamax 50 mg p.o. q.h.s.  5. Triamterene HCTZ 37.5/25 half tablet daily.  6. Lyrica 75 mg p.o. t.i.d.  7. Aspirin 81 mg p.o. daily.  8. Requip 0.5 mg 1 tablet 3 hours prior to bedtime.  9. Sonata 10 mg p.o. q.h.s. p.r.n.  10.Provigil 200 mg p.o. daily.  11.Clonazepam 0.5 mg p.o. q.h.s. p.r.n.  12.Zofran 4 mg a.c. and h.s.  13.Domperidone 10 mg p.o. q.i.d.  14.Promethazine 25 mg p.o. q.i.d. p.r.n.   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  No tobacco use.  No alcohol use.  No IV drug use.   FAMILY HISTORY:  Noncontributory.   REVIEW OF SYSTEMS:  As per HPI, otherwise negative.   PHYSICAL EXAM:  VITAL SIGNS:  Temperature 97.1, blood pressure 121/83,  pulse of 100, respiratory rate 27, satting 100%.  GENERAL:  The patient is on gurney with occasional dry heaving.  HEENT: Normocephalic, atraumatic.  Pupils equal, round and reactive to  light and accommodation.  Extraocular movements intact.  Oropharynx is  clear.  No lesions, no exudates.  NECK: Supple.  No lymphadenopathy.  Dry mucous membranes.  RESPIRATORY:  Lungs are clear to auscultation bilaterally.  No wheezes,  no crackles.  No rhonchi.  CARDIOVASCULAR:  Tachycardiac, regular  rhythm.  No murmurs, rubs or gallops.  ABDOMEN: Soft, obese, nontender, nondistended.  Positive bowel sounds.  EXTREMITIES: No clubbing,  cyanosis or edema.  NEUROLOGICAL:  The patient is alert and oriented x3.  Cranial nerves II-  XII are grossly intact.  No focal deficits.   LABS:  Urine pregnancy negative.  CMET:  Sodium 144, potassium 3.1,  chloride 111, bicarb 17, BUN 13, creatinine 0.95, glucose of 127,  bilirubin 1, alkaline phosphatase 77, AST 20, ALT 20, protein 6.8,  albumin 3.2, calcium of 9.1.  CBC white count 21.9, hemoglobin 12.9,  platelets 373, hematocrit 38.2, ANC of 17.3.  EKG with normal sinus  rhythm.  Acute abdominal series showing no evidence of bowel obstruction  and also status post cholecystectomy.   ASSESSMENT AND PLAN:  [name] is a 55 year old female with  multiple medical problems including fibromyalgia, depression, anxiety  gastroparesis and multiple hospitalizations for nausea, vomiting and  diarrhea who presents to the emergency department with and nausea,  vomiting and diarrhea.  1. Nausea, vomiting, diarrhea, likely secondary to gastroparesis      versus gastroenteritis.  Will admit the patient.  Place her on IV      fluids for hydration.  Will make her n.p.o. Will check stool      studies. Will hold oral medications. Will start on IV Reglan, IV      Zofran, and IV Protonix.  If no improvement in her symptoms, will      consult with GI Dr. Loreta Ave for further evaluation and      recommendations.  2. Leukocytosis, likely reactive leukocytosis.  Chest x-ray was      negative for infiltrate.  UA was negative for nitrates. However,      small leukocytes.  Micro with WBCs 0-2. The patient is afebrile.      No need for antibiotics at this time. Will follow.  3. Dehydration.  Hydrate with IV fluids.  4. Hypokalemia.  Replete and check a magnesium level.  5. History of gastroparesis. Reglan.  6. Depression/anxiety.  Hold oral p.o. medications. Will place on IV      Ativan as needed.  Gastroesophageal reflux disease.  Protonix.  1. History of migraines.  2. Peripheral neuropathy.  3.  Status post cholecystectomy.  4. Fibromyalgia.  5. Restless leg syndrome.  6. Status post right knee arthroscopy.  7. Prophylaxis. Protonix for GI prophylaxis.  SCDs for DVT      prophylaxis.   It has been a pleasure taking care of [name].      Ramiro Harvest, MD  Electronically Signed     DT/MEDQ  D:  10/10/2008  T:  10/10/2008  Job:  [id number]  cc:   Chales Salmon. Abigail Miyamoto, M.D.  Fax: [phone]   Clovis Riley, FNP  [facility]   Jyothi Elsie Amis, M.D.  Fax: [phone]

## 2011-04-10 NOTE — Discharge Summary (Signed)
NAMETALAH, [name]                 ACCOUNT NO.:  [id number]   MEDICAL RECORD NO.:  [id number]          PATIENT TYPE:  INP   LOCATION:  1305                         FACILITY:  [facility]   PHYSICIAN:  Hollice Espy, M.D.DATE OF BIRTH:  May 08, 1956   DATE OF ADMISSION:  08/17/2008  DATE OF DISCHARGE:  08/24/2008                               DISCHARGE SUMMARY   PRIMARY CARE PHYSICIAN:  Chales Salmon. Abigail Miyamoto, M.D.   GASTROENTEROLOGIST:  Anselmo Rod, M.D., Jordan Hawks. Elnoria Howard, M.D.   DISCHARGE DIAGNOSES:  1. Gastroparesis.  2. Nausea with vomiting.  3. Depression.  4. Gastroesophageal reflux disease.  5. Hypertension.  6. Anxiety.   DISCHARGE MEDICATIONS:  1. Effexor 150 p.o. b.i.d.  2. Nexium 40  p.o. daily.  3. Xanax 0.25 p.o. daily.  4. Aspirin 81 p.o. daily.  5. Triamterene/HCTZ 37.5/25 p.o. daily.  6. Sonata 5 mg nightly p.r.n.  7. Topamax 50 p.o. daily.  8. Requip 0.5 p.o. daily.  9. Provigil 200 p.o. daily.  10.Clonazepam 0.5 p.o. daily p.r.n.  11.Reglan will be increased to 10 mg p.o. 3 times a day with meals and      at night.  12.Zofran 4 mg every 6 hours.  Patient may also in addition have      Zofran 4 mg p.r.n. with meals and at night.   HOSPITAL COURSE:  Patient is a 55 year old white female with a past  medical history of depression, anxiety, and gastroparesis who was  admitted for vomiting and diarrhea.  Initially, she was found to have  leukocytosis of 16.2, put on IV fluids and hydration with no  antibiotics.  Her symptoms improved down to 7.8, which was felt to be  stress margination.  Patient underwent EGD and gastric emptying study.  The EGD was unremarkable.  The gastric emptying study noted delayed  gastric emptying secondary to gastroparesis.  Patient was put back on  Reglan  and over the next several days the diet was tried to be  advanced.  However, she had problems with continued diarrhea and poor  p.o. intake.  Following by August 23, 2008, she  was continuing to do  much better.  Her Reglan and Zofran  were scheduled around the time of  her meals.  She felt that this significantly improved her symptoms, and  she was able to advance to solid food.  At this point now, she is felt  to be able to keep fluid down.  Her vital signs are stable.  She is  being discharged to home.   PLAN:  Patient is to follow up with Drs. Loreta Ave and [place] in the office in  two weeks.   Her overall disposition is improved.   DISCHARGE DIET:  Gastroparesis diet, which she has received information  for.  She is being discharged to home.      Hollice Espy, M.D.  Electronically Signed     SKK/MEDQ  D:  08/24/2008  T:  08/24/2008  Job:  [id number]   cc:   Anselmo Rod, M.D.  Fax: [phone]   Jordan Hawks. [place],  MD  Fax: [phone]   Chales Salmon. Abigail Miyamoto, M.D.  Fax: [phone]

## 2011-04-10 NOTE — H&P (Signed)
[name], [name]                 ACCOUNT NO.:  [id number]   MEDICAL RECORD NO.:  [id number]          PATIENT TYPE:  INP   LOCATION:  1407                         FACILITY:  [facility]   PHYSICIAN:  Corinna L. Lendell Caprice, MDDATE OF BIRTH:  12/31/1955   DATE OF ADMISSION:  08/17/2008  DATE OF DISCHARGE:                              HISTORY & PHYSICAL   CHIEF COMPLAINT:  Vomiting.   HISTORY OF PRESENT ILLNESS:  [name] is a 55 year old white female  with a history of gastroparesis and multiple admissions for vomiting and  diarrhea.  Most recent admission was in August of this year.  She has a  history of gastroparesis that was very severe for several years and then  improved for several years.  She has had problems with vomiting and  diarrhea over the past several weeks on and off.  She was hospitalized  twice recently and did fairly well with the exception of the Labor Day  weekend and subsequently last night.  She was having intractable  vomiting.  She was unable to keep down any medications.  She was unable  to keep down any liquids or solids so she presented to the emergency  room.  She sees Dr. Charna Elizabeth.  She had been discharged on Reglan  after her last hospitalization.  She had a single episode of diarrhea  here in the emergency room.  She has had no hematemesis, no  hematochezia.  Dr. Elnoria Howard is here and has evaluated the patient.  He is  taking her to endoscopy.   PAST MEDICAL HISTORY:  Reviewed and as per previous H&P.   MEDICATIONS:  Reviewed and as per previous discharge summary.  Please  note she has been off her Mobic.   SOCIAL HISTORY/FAMILY HISTORY:  Reviewed and as per previous.   REVIEW OF SYSTEMS:  As above; otherwise, negative.   PHYSICAL EXAMINATION:  Blood pressure initially 80 systolic, currently  100 systolic.  Please see ER notes for vital signs.  GENERAL:  Patient is an obese white female in no acute stress.  HEENT:  Normocephalic, atraumatic.  Pupils  equal, round, reactive to  light.  Sclerae nonicteric.  Slightly dry mucous membranes.  NECK:  Thick and supple.  LUNGS:  Clear to auscultation bilaterally without wheezes, rhonchi or  rales.  CARDIOVASCULAR:  Regular rate and rhythm without murmurs, gallops, rubs.  ABDOMEN:  Note is obese, soft, nontender, nondistended.  GU/RECTAL:  Were deferred.  EXTREMITIES:  No clubbing, cyanosis or edema.  SKIN:  No rash.  PSYCHIATRIC:  Calm, occasionally tearful.  NEUROLOGIC:  Alert and oriented.  Cranial nerves and sensory motor exam  are intact.   LABORATORIES:  White blood cell count is 16,000, hemoglobin 15.9,  hematocrit 46.7, 92% neutrophils, platelet count 401,000.  Complete  metabolic panel significant for a potassium of 3.3, glucose 146, BUN 24.  SGOT 59, SGPT 50.  Lipase 28.   ASSESSMENT AND PLAN:  1. Recurrent vomiting and diarrhea.  She will have upper endoscopy      today and then plans per Dr. Elnoria Howard.  She is dehydrated  and was      transiently hypotensive.  She has received several liters of fluid.      I will continue IV hydration and clear liquid diet.  Dr. Elnoria Howard asked      that she not be given Reglan in case she needs gastric emptying      study.  She will get intravenous Protonix and I will hold her      medications until she is tolerating a diet.  2. Leukocytosis.  Suspect stress response, but also consider      infectious etiology.  3. Hypokalemia.  This will be repleted intravenous.  4. Dehydration and prerenal azotemia.  5. Obesity.  6. Anxiety.  7. Fibromyalgia.  8. History of gastroparesis.  9. Status post cholecystectomy.  10.Depression.  11.Gastroesophageal reflux disease.  12.History of migraines.  13.Peripheral neuropathy.  14.Bursitis of the hip.      Corinna L. Lendell Caprice, MD  Electronically Signed     CLS/MEDQ  D:  08/17/2008  T:  08/18/2008  Job:  [id number]   cc:   Chales Salmon. Abigail Miyamoto, M.D.  Fax: [phone]

## 2011-04-10 NOTE — Discharge Summary (Signed)
NAMEMARLY, [name]                 ACCOUNT NO.:  [id number]   MEDICAL RECORD NO.:  [id number]          PATIENT TYPE:  INP   LOCATION:  6706                         FACILITY:  [facility]   PHYSICIAN:  [name], MDDATE OF BIRTH:  [birth year]   DATE OF ADMISSION:  06/26/2008  DATE OF DISCHARGE:  06/29/2008                               DISCHARGE SUMMARY   DISCHARGE DIAGNOSES:  1. Intractable vomiting and diarrhea, suspect gastroenteritis with a      history of gastroparesis.  2. Left hip pain.  3. Fibromyalgia.  4. History of gastroparesis.  5. Previous cholecystectomy.  6. Depression and anxiety.  7. Hypertension.   DISCHARGE MEDICATIONS:  1. Reglan 10 mg every 6 hours as needed for nausea or Zofran 4 mg p.o.      q.6 h. p.r.n. nausea.  2. Tylenol, Ultram, or Dilaudid as needed for pain.  A prescription      for 2 mg Dilaudid p.o. q.8 h. p.r.n. pain dispensed 10 was given to      the patient.  3. She is to stop meloxicam.  4. Continue Effexor 150 mg p.o. b.i.d., Nexium 40 mg a day, alprazolam      0.25 mg half tablet as needed, Topamax 50 mg nightly, triamterene      hydrochlorothiazide 37.5/25 mg half tablet daily, Lyrica 75 mg p.o.      t.i.d., aspirin 81 mg a day, ReQuip 0.5 mg nightly, Sonata 10 mg      nightly as needed for insomnia, Provigil 200 mg a day, clonazepam      0.5 mg nightly as needed, methocarbamol 500 mg p.o. t.i.d. as      needed.   CONDITION:  Stable.   ACTIVITY:  She may return to work on July 05, 2008.   CONSULTATIONS:  None.   PROCEDURES:  None.   LABORATORY DATA:  White blood cell count on admission 14,000, hemoglobin  15.2, hematocrit 46, 81% neutrophils, 12% lymphocytes, platelet count  426.  At discharge, her white blood cell count was 11,000.  The rest of  her CBC was within normal limits.  Complete metabolic panel on admission  normal lipase, normal TSH 3.440, urine pregnancy negative, urinalysis  negative.  Acute abdominal series  showed nonobstructive bowel gas  pattern.   HISTORY AND HOSPITAL COURSE:  [name] is a 55 year old white female  with history of gastroparesis, fibromyalgia, and right hip pain who  presented within 24 hours after being discharged for a viral  gastroenteritis.  At discharge during the last hospitalization, she was  tolerating a solid diet and had been requesting to go home for two days.  She did fine on the day of discharge, but the morning of re-admission  had intractable vomiting again.  Her diarrhea also had resumed.  She  reported that she had had some coworkers who had similar symptoms.  She  has been ill for about three weeks.  She has a history of gastroparesis,  but has been off her medications for several years.  The patient had no  fevers or chills.  She had  negative stool for fecal leukocytes and  negative C. diff during her last hospitalization.   Her vomiting improved with IV Reglan and p.r.n. Zofran.  At this time,  she is on a solid diet.  She feels occasional nausea, but has not  vomited for several days.  Her diarrhea has improved as well.  She has a  lot of anxiety on admission and was very tearful.  She was very  concerned that gastroparesis with continued to be a problem for her.  She has been off antiemetics since yesterday and is able to tolerate her  diet.  She had a soft abdomen, stable vital signs, and she has had a  formed stools today.  The patient had complained of a lot of left hip  pain and apparently had an MRI as an outpatient and is scheduled to see  an orthopedist later this month.  The pain was controlled with small  doses of IV Dilaudid.  Due to her GI issues, I have asked her to stop  her meloxicam.  FMLA papers have been filled out.   TOTAL TIME ON THE DAY OF DISCHARGE:  45 minutes.      Leah L. Lendell Caprice, MD  Electronically Signed     CLS/MEDQ  D:  06/29/2008  T:  06/30/2008  Job:  [id number]   cc:   Chales Salmon. Abigail Miyamoto, M.D.

## 2011-04-10 NOTE — H&P (Signed)
[name], [name]                 ACCOUNT NO.:  [id number]   MEDICAL RECORD NO.:  [id number]          PATIENT TYPE:  INP   LOCATION:  6713                         FACILITY:  [facility]   PHYSICIAN:  Corinna L. Lendell Caprice, MDDATE OF BIRTH:  Aug 04, 1956   DATE OF ADMISSION:  06/23/2008  DATE OF DISCHARGE:                              HISTORY & PHYSICAL   CHIEF COMPLAINT:  Vomiting.   HISTORY OF PRESENT ILLNESS:  [name] is a 55 year old white female  with a history of gastroparesis in the past which has not recently been  a problem but who presents with intractable vomiting.  She reports that  she had nausea several weeks ago but has been vomiting since Monday. She  also had a few episodes of diarrhea.  She has had no hematemesis, no  abdominal pain, no hematochezia.  She reports that there are several  people who have been ill with similar symptoms at work place.  She has  been started on Mobic recently for hip pain and thinks that may be that  this could be contributing to her symptoms.  She has had no fevers or  chills.  She is status post cholecystectomy.  She has not been on  treatment for gastroparesis for over a year.  She has no cough, no sore  throat, rhinorrhea, no myalgias or arthralgias other than her hip and  left buttock.   PAST MEDICAL HISTORY:  As above.  Also fibromyalgia, history of  migraines, gastroesophageal reflux disease and peripheral neuropathy, as  well as depression.   MEDICATIONS:  1. Effexor 150 mg p.o. b.i.d.  2. Nexium 40 mg a day.  3. Alprazolam 0.25 mg one-half tablet as needed.  4. Topamax 50 mg nightly.  5. Triamterene and hydrochlorothiazide 37.5/25 one half tablet daily.  6. Lyrica 75 mg p.o. t.i.d.  7. Aspirin 81 mg daily.  8. ReQuip 0.5 mg nightly.  9. Sonata 10 mg nightly as needed for insomnia.  10.Provigil 200 mg a day.  11.Clonazepam 0.5 mg p.o. nightly as needed, although she rarely takes      this.  12.Methocarbamol 500 mg p.o. t.i.d.  as needed.  13.Meloxicam 15 mg daily.  14.She has been taking Zofran as needed this week.   ALLERGIES:  She reports an allergy to ERYTHROMYCIN.   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  She is married.  She does not  drink or smoke.   FAMILY HISTORY:  Noncontributory.   REVIEW OF SYSTEMS:  As above otherwise negative.   PHYSICAL EXAMINATION:  VITAL SIGNS:  Temperature is 98.4, blood pressure  143/97, pulse 107, respiratory rate 20, and oxygen saturation 97% on  room air. GENERAL:  The patient is an obese white female in no acute  distress.  HEENT:  Normocephalic, atraumatic.  Pupils equal, round, and reactive to  light.  Sclerae nonicteric.  Dry mucous membranes.  Oropharynx is  without erythema or exudate.  NECK:  Thick and supple.  No lymphadenopathy.  LUNGS:  Clear to auscultation bilaterally without wheezes, rhonchi or  rales.  CARDIOVASCULAR:  Regular rate and  rhythm without murmurs, gallops or  rubs.  ABDOMEN:  Normal bowel sounds, soft, nontender, and nondistended.  GU and RECTAL:  Deferred.  EXTREMITIES:  No clubbing, cyanosis or edema.  She has no tenderness  about the hip.  With external rotation of the hip, she reports left  buttock pain and she reports buttock pain with straight leg raise.  SKIN:  No jaundice and no rash.  PSYCHIATRIC:  She is occasionally tearful.  NEUROLOGIC:  Alert and oriented.  Cranial nerves and sensorimotor exam  are intact.   LABORATORY DATA:  CBC is significant for white blood cell count of  13,000 with 78% neutrophils.  Complete metabolic panel is essentially  unremarkable.  Urine pregnancy negative.  Urinalysis shows a specific  gravity 1.026, negative ketones, negative blood, negative nitrite, and  negative leukocyte esterase.  EKG shows normal sinus rhythm, low-  voltage.  Acute abdominal series shows a nonobstructive bowel gas  pattern, cholecystectomy, mild atelectasis nothing acute.   ASSESSMENT/PLAN:  1. Vomiting and diarrhea  suspect viral gastroenteritis.  The patient      had Dilaudid, Reglan, and Zofran twice and has had dry heaves      despite all these.  I will therefore admit her for IV hydration and      antiemetics.  I suspect this is a viral gastroenteritis rather than      her gastroparesis.  Due to the leukocytosis, I will get a stool for      Clostridium difficile and fecal leukocytes, although I doubt for      enteric pathogen.  She will be on clear liquids, advance as      tolerated.  2. Hypertension.  Hold triamterene and hydrochlorothiazide.  3. Left hip/buttock pain.  I will get records from her primary care      physician's office.  4. Depression and anxiety.  Continue outpatient medications.  5. Fibromyalgia.  6. History of gastroparesis.  7. Status post cholecystectomy.      Corinna L. Lendell Caprice, MD  Electronically Signed     CLS/MEDQ  D:  06/23/2008  T:  06/24/2008  Job:  [id number]   cc:   Juluis Rainier, M.D.

## 2011-04-10 NOTE — Assessment & Plan Note (Signed)
[facility]                                 ON-CALL NOTE   GRACIE, GUPTA                          MRN:          [id number]  DATE:10/10/2008                            DOB:          December 02, 1955    TIME:  10:45 a.m.   PHYSICIAN:  Anselmo Rod, M.D.   Ms. Orzechowski has gastroparesis and is maintained on domperidone and  Phenergan tablets.  Her husband calls relating that she has had  refractory vomiting since Thursday.  She has not used Phenergan  suppositories that she has at home.  She has no pain, bleeding, fevers  or chills.  She believes it is a flare of her gastroparesis.  She was  recently changed from metoclopramide to domperidone by Dr. Loreta Ave.  I  advised that she come to the emergency room for further evaluation and  treatment.  She prefers to try her Phenergan suppositories.  If they are  not effective after the first two doses I have advised  her husband that  he should bring her to the emergency room for further evaluation and  treatment.  Prescription called in for promethazine 25 mg suppositories  one pr q6h prn nausea and vomiting, #12 and no refills.  I have asked  them to contact Dr. Loreta Ave on Monday for further management plan.  I spoke to her husband later, about 2pm, and the suppositories have not  helped and she will go to [facility] ED for further evaluation and managment  now.     Venita Lick. Russella Dar, MD, [facility]  Electronically Signed    MTS/MedQ  DD: 10/10/2008  DT: 10/10/2008  Job #: [id number]

## 2011-04-10 NOTE — Discharge Summary (Signed)
NAMEJEANITA, CARNEIRO                 ACCOUNT NO.:  [id number]   MEDICAL RECORD NO.:  [id number]          PATIENT TYPE:  INP   LOCATION:  6713                         FACILITY:  [facility]   PHYSICIAN:  Corinna L. Lendell Caprice, MDDATE OF BIRTH:  [birth year]   DATE OF ADMISSION:  06/23/2008  DATE OF DISCHARGE:  06/25/2008                               DISCHARGE SUMMARY   DISCHARGE DIAGNOSES:  1. Vomiting and diarrhea, resolved, suspect viral gastroenteritis and      also consider a component of gastroparesis.  2. Fibromyalgia.  3. History of gastroparesis.  4. Status post cholecystectomy.  5. Depression.  6. Anxiety.  7. Hypertension.  8. Gastroesophageal reflux disease.  9. History of migraines.  10.Peripheral neuropathy.   DISCHARGE MEDICATIONS:  1. Ultram 50 mg p.o. b.i.d.  2. Reglan 10 mg every 6 hours as needed.  3. Continue Effexor 150 mg daily.  4. Nexium 40 mg a day.  5. Xanax 0.25 mg daily as needed.  6. Aspirin 81 mg a day.  7. Hold triamterene/hydrochlorothiazide.  8. Sonata 10 mg nightly as needed.  9. Topamax 50 mg a day.  10.ReQuip 0.5 mg a day.  11.Provigil 200 mg a day.  12.Clonazepam 0.5 mg a day.  13.Hold meloxicam.  14.Continue Zofran 4 mg as needed.   Follow up with primary care physician in 1 week.   Diet should be BRAT diet for 3-4 days, advance as tolerated.   CONDITION:  Stable.   CONSULTATIONS:  None.   PROCEDURES:  None.   LABORATORY DATA:  Stool for C. diff toxin negative.  Fecal lactoferrin  negative.  White blood cell count of 13,000, otherwise, unremarkable.  Basic metabolic panel unremarkable.  Liver function tests unremarkable.  Urinalysis negative.  C. diff negative x2.  Point-of-care enzymes  negative.   SPECIAL STUDIES RADIOLOGY:  Acute abdominal series showed nonobstructive  bowel gas pattern.  EKG showed normal sinus rhythm, low voltage.   HISTORY AND HOSPITAL COURSE:  Ms. Rallo is a 55 year old white female  who presented with  vomiting and diarrhea.  She had had nausea for  several weeks but had been vomiting for several days.  She had no  hematemesis, no hematochezia, no melena.  She reported contact with  coworkers with similar symptoms recently.  She had been started on Mobic  recently for hip pain and thought that it may be contributing to her  symptoms.  She had no fevers or chills.  She has a history of  gastroparesis but has not been on treatment for this for over a year.  She had a pulse of 107.  She was afebrile, and had a blood pressure of  143/97.  She had dry mucous membranes.  Soft, nontender, and  nondistended abdomen.  She was admitted for supportive care.  C. diff  was ruled out, and she had negative fecal lactoferrin, and she was  treated with IV fluids, Reglan, pain medication as needed.  Her symptoms  resolved.  She had still had a few episodes of diarrhea but was  requesting to go home and had been tolerating  a solid diet for 2 days.  She remained afebrile and was subsequently discharged by Dr. Adela Glimpse.      Corinna L. Lendell Caprice, MD  Electronically Signed     CLS/MEDQ  D:  08/04/2008  T:  08/05/2008  Job:  [id number]

## 2011-04-10 NOTE — Discharge Summary (Signed)
NAMEAZURE, BUDNICK                 ACCOUNT NO.:  [id number]   MEDICAL RECORD NO.:  [id number]          PATIENT TYPE:  INP   LOCATION:  1504                         FACILITY:  [facility]   PHYSICIAN:  Hollice Espy, M.D.DATE OF BIRTH:  05-01-1956   DATE OF ADMISSION:  10/10/2008  DATE OF DISCHARGE:                               DISCHARGE SUMMARY   PCP:  Clovis Riley, nurse practitioner, at [facility].   GASTROENTEROLOGIST:  Dr. Charna Elizabeth.   DISCHARGE DIAGNOSES:  1. C. diff colitis.  2. Chronic gastroparesis.  3. Hypokalemia, now resolved.  4. Dehydration  5. History of fibromyalgia.  6. History of depression.   DISCHARGE MEDICATIONS:   NEW MEDICATIONS:  For this patient are as follows:  Flagyl 500 mg p.o.  every 6 hours x11 days.   Patient will also continue all of her previous medications; these are as  follows:  1. Reglan 10 mg p.o. 4 times a day.  2. Phenergan 25 mg p.o. 4 times a day as needed for nausea.  3. Effexor 150 p.o. b.i.d.  4. Nexium 40 p.o. daily.  5. Xanax 0.25 one half tab to 1 tab p.o. daily p.r.n.  6. Topamax 50 p.o. q.h.s.  7. Triamterene/hydrochlorothiazide 37.5/25 one half tab p.o. daily.  8. Lyrica 0.75 p.o. t.i.d.  9. Aspirin 81 p.o. daily.  10.Requip 0.5 p.o. q.h.s.  11.Sonata 10 mg p.o. q.h.s. p.r.n. for sleep.  12.Provigil 200 p.o. daily.  13.Clonazepam 0.5 mg p.o. q.h.s. for sleep.  14.Zofran 4 mg with meals and at bedtime.   HOSPITAL COURSE:  Patient is a 55 year old white female with past  medical history of gastroparesis, anxiety, and depression who presented  with complaints of severe abdominal pain and distress, as well as some  nausea, vomiting, and diarrhea.  This was felt be secondary to her  gastroparesis versus gastroenteritis noting that she had a white count  of 21.9 on admission.  Stool cultures were sent for C. diff and  initially patient was symptomatically treated with IV fluids, pain, and  nausea medication.   However, on October 11, 2008, patient's C. diff  cultures came back positive.  She was started on p.o. Flagyl.  Following  this, patient's white count improved dramatically and over the next  several days her leukocytosis normalized.  She was otherwise doing well  and by October 13, 2008, she was tolerating p.o. and felt to be  medically stable for discharge.  In regard to her gastroparesis, she  will continue on her normal course of Reglan.  She will continue on  Flagyl for 11 more days for a total of 2 weeks therapy and she will  continue on the rest of her medications as tolerated.  Her overall  disposition is improved.  Her activity will be slow to increase.  Because of the concern for the possibility of the risk for infection of  others, she will be in isolation for at least 1 week's time until she  has completed at least the first week of antibiotic therapy.  This  coincides with the Thanksgiving weekend so  therefore I am planning her  to return back to work on the Monday following Thanksgiving; this is  October 25, 2008.  Patient will follow up with her PCP in 2 weeks'  time.  She also has followup appointment with a GI specialist in [facility] as per Dr. Kenna Gilbert referral in several weeks' time.  Patient's  discharge diet will be a low-sodium diet and she is being discharged to  home.      Hollice Espy, M.D.  Electronically Signed     SKK/MEDQ  D:  10/14/2008  T:  10/14/2008  Job:  [id number]   cc:   Clovis Riley, F.N.P.  [facility] Elsie Amis, M.D.  Fax: [phone]

## 2011-04-10 NOTE — Consult Note (Signed)
[name], [name]                 ACCOUNT NO.:  [id number]   MEDICAL RECORD NO.:  [id number]          PATIENT TYPE:  INP   LOCATION:  1407                         FACILITY:  [facility]   PHYSICIAN:  Jordan Hawks. Elnoria Howard, MD    DATE OF BIRTH:  1956-06-07   DATE OF CONSULTATION:  08/17/2008  DATE OF DISCHARGE:                                 CONSULTATION   REASON FOR CONSULTATION:  Recurrent nausea and vomiting.   HISTORY OF PRESENT ILLNESS:  This is a 55 year old female with a past  medical history of gastroparesis status post cholecystectomy,  fibromyalgia, gastroesophageal reflux disease, anxiety and depression  was admitted to the hospital with complaints of nausea and vomiting.  The patient states that her symptoms were again acute in onset.  Recently she has been admitted multiple times since July for her  complaints of nausea, and vomiting.  She has a history of gastroparesis  and 2005 states she reports undergoing an evaluation at [facility] for  her condition.  However, at that time for reasons unknown, her symptoms  had abated and no further treatment was performed, although she did have  recurrence of similar types of symptoms around that time and it was felt  that it was secondary to her gallbladder, which was subsequently  removed.  Since her cholecystectomy, the patient denies having any  further symptoms of nausea, vomiting until this past July.  She reports  having some pain which is different from her prior complaints of nausea  and vomiting.  The pain is in the epigastric region and chest, and she  felt that it is associated with her gastroesophageal reflux disease.  She has been on Nexium continuously per her report and this medication  has not subsided her prior nausea and vomiting episodes.  There is an  associated episode of diarrhea and during the prior hospitalizations'  workup for her diarrhea and other issues were negative for any  identifiable causes.   PAST  MEDICAL AND SURGICAL HISTORY:  As stated above.   FAMILY HISTORY:  Noncontributory.   SOCIAL HISTORY:  Negative for alcohol, tobacco or illicit drug use.   ALLERGIES:  ERYTHROMYCIN.   MEDICATIONS:  1. Effexor 150 mg p.o. b.i.d.  2. Nexium 40 mg p.o. daily.  3. Alprazolam 0.25 mg 1/2 tablet p.o. p.r.n.  4. Topamax 50 mg p.o. nightly.  5. Triamterene/hydrochlorothiazide 37.5/25 one tablet p.o. daily.  6. Lyrica 75 mg 1 p.o. t.i.d.  7. Aspirin 81 mg p.o. daily.  8. Requip 0.5 mg p.o. nightly.  9. Sonata 10 mg n.p.o. nightly.  10.Provigil 200 mg 1 p.o. daily.  11.Clonazepam to 0.5 mg 1 p.o. nightly.  12.Methocarbamol 500 mg p.o. t.i.d. p.r.n.  13.Meloxicam 50 mg 1 p.o. daily.   REVIEW OF SYSTEMS:  As stated above in the history of present illness,  otherwise negative.   PHYSICAL EXAMINATION:  VITAL SIGNS:  Blood pressure is 114/74, heart  rate is 124, respirations at 18, pulse ox is 98% on at 2L of oxygen.  GENERAL:  The patient is in no acute distress although she appears  fatigued.  HEENT:  Normocephalic, normocephalic, atraumatic.  Extraocular muscles  intact.  NECK:  Supple.  No lymphadenopathy.  LUNGS:  Clear to auscultation bilaterally.  CARDIOVASCULAR:  Regular rate and rhythm.  ABDOMEN:  Obese, soft, nontender nondistended with positive bowel  sounds.  EXTREMITIES:  No clubbing, cyanosis or edema.   LABORATORY VALUES:  White blood cell count is 69.2, hemoglobin 15.9, MCV  is 91.9, platelets at 401,000.  Sodium 141, potassium 3.3, chloride 110,  CO2 20, glucose 146, BUN 24, creatinine 1.1, total bilirubin is 1.1, alk  phos is 83, AST 59, ALT 50, lipase is 28.   IMPRESSION:  1. Nausea and vomiting.  2. Dehydration.  3. Probable gastroparesis.   The patient has not had a recent EGD nor a gastric emptying scan in a  number of years.  I believe, at this time, it would be worthwhile to  repeat these procedures in order to help identify the cause and etiology  of her  nausea and vomiting.  The patient was reported to improve with  Reglan and during her hospitalizations in the past, which can be  consistent with her gastroparesis.  Plan at this time is to perform EGD  and pending the results of the EGD a gastric emptying scan may be  performed.      Jordan Hawks Elnoria Howard, MD  Electronically Signed     PDH/MEDQ  D:  08/17/2008  T:  08/18/2008  Job:  [id number]

## 2011-04-10 NOTE — H&P (Signed)
[name], [name]                 ACCOUNT NO.:  [id number]   MEDICAL RECORD NO.:  [id number]          PATIENT TYPE:  INP   LOCATION:  6706                         FACILITY:  [facility]   PHYSICIAN:  Corinna L. Lendell Caprice, MDDATE OF BIRTH:  02-18-1956   DATE OF ADMISSION:  06/26/2008  DATE OF DISCHARGE:                              HISTORY & PHYSICAL   CHIEF COMPLAINT:  Vomiting.   HISTORY OF PRESENT ILLNESS:  [name] is a 55 year old white female who  was discharged yesterday by Dr. Felipa Furnace.  She had had vomiting and  diarrhea.  She has a history of gastroparesis.  She had a negative C.  diff, negative fecal leukocytes during her last hospitalization.  She  was suspected to have viral gastroenteritis, but certainly problems with  gastroparesis was within the differential.  She had been tolerating a  solid diet for two days and was requesting to go home for two days.  She  continued to have diarrhea but this has improved.  She did well after  discharge until this morning when she began having vomiting again.  She  has no new symptoms.  Please see H&P for more details.  She received  Zofran twice in the emergency room and feels very anxious currently.  She is scared that she is having return of her problems with  gastroparesis.  She has no abdominal pain.  She has had no hematemesis.  Her diarrhea has improved.   PAST MEDICAL HISTORY:  Reviewed and as per previous H&P.   Medications are the same as upon previous H&P.   SOCIAL HISTORY AND FAMILY HISTORY:  Reviewed and as per previous.   REVIEW OF SYSTEMS:  As above, otherwise negative.   PHYSICAL EXAMINATION:  VITAL SIGNS:  Temperature is 97.4, pulse 89,  respiratory rate 20, blood pressure 115/79, and oxygen saturation 98% on  room air.  She weighs 88 kg.  GENERAL:  The patient is a very tearful, anxious-appearing, overweight  white female.  HEENT: Normocephalic, atraumatic.  Pupils equal, round, and reactive to  light.  She has  moist mucous membranes.  NECK:  Supple.  No lymphadenopathy.  No thyromegaly.  LUNGS:  Clear to auscultation bilaterally without wheezes, rhonchi, or  rales.  CARDIOVASCULAR:  Regular rate and rhythm without murmurs, gallops, or  rubs.  ABDOMEN:  Normal bowel sounds, soft, nontender, and nondistended.  GU AND RECTAL:  Deferred.  EXTREMITIES:  No clubbing, cyanosis, or edema.  SKIN:  No rash.  PSYCHIATRIC:  The patient is cooperative, tearful, and anxious.  NEUROLOGIC:  She is alert and oriented.  Cranial nerves and sensory  motor exams are intact.   LABORATORY DATA:  Urinalysis:  Negative nitrites, negative leukocyte  esterase, negative blood, negative protein, negative ketones.  Specific  gravity 1.014.  Urine pregnancy negative, lipase normal.  Complete  metabolic panel unremarkable.  CBC is significant for a white blood cell  count of 14,000, hemoglobin of 15.2, hematocrit of 46, platelet count  426, neutrophils 81.  She had a leukocytosis during her last  hospitalization on admission which resolved without any antibiotics.  Acute abdominal series show nonobstructive bowel gas pattern status post  cholecystectomy.   ASSESSMENT AND PLAN:  1. Vomiting.  The patient's vomiting resolved very quickly with Reglan      during her last hospitalization.  I will resume this, start her on      clear liquid diet as tolerated, give anxiety medications as needed,      give IV Protonix.  If she cystic continues to be a problem, consult      GI.  2. Fibromyalgia.  3. History of gastroparesis.  4. Status post cholecystectomy.  5. Depression and anxiety.  6. Hypertension.      Corinna L. Lendell Caprice, MD  Electronically Signed     CLS/MEDQ  D:  06/26/2008  T:  06/27/2008  Job:  [id number]   cc:   Juluis Rainier, M.D.

## 2011-04-10 NOTE — Discharge Summary (Signed)
[name], [name]                 ACCOUNT NO.:  [id number]   MEDICAL RECORD NO.:  [id number]          PATIENT TYPE:  OBV   LOCATION:  1343                         FACILITY:  [facility]   PHYSICIAN:  Corinna L. Lendell Caprice, MDDATE OF BIRTH:  28-Nov-1955   DATE OF ADMISSION:  10/20/2008  DATE OF DISCHARGE:  10/22/2008                               DISCHARGE SUMMARY   DISCHARGE DIAGNOSES:  1. Vomiting, resolved.  2. Clostridium difficile colitis.  3. Gastroparesis.  4. Anxiety.  5. Resolved hypokalemia.  6. Fibromyalgia.  7. Gastroesophageal reflux disease.  8. Peripheral neuropathy.  9. Depression.   DISCHARGE MEDICATIONS:  1. Stop Flagyl.  2. Start vancomycin 250 mg p.o. q.i.d. for another week.  3. Continue the rest of her outpatient medications.  Please see H&P      and medicine reconciliation form.   FOLLOWUP:  Follow up with gastroenterologist in [place] as  previously scheduled.   CONDITION:  Stable.   ACTIVITY:  Ad lib.   DIET:  As tolerated.   BRIEF HISTORY AND HOSPITAL COURSE:  [name] is a 55 year old white  female with multiple admissions for vomiting.  She has a history of  gastroparesis, and was recently diagnosed with C. difficile colitis and  sent home on Flagyl.  She returns with intractable vomiting, and  reported she was unable to keep anything down including Flagyl.  She  called Dr. Kenna Gilbert office who recommended she present to the emergency  room.  She received several anti-emetics and anti-anxiety medications in  the emergency room and continued to have loud retching sounds, although  the ED physician said that he saw no emesis.  The patient did have  evidence of dry mucous membranes, but had a normal specific gravity and  no urine ketones.  She also had a BUN of 1.  She was slightly  hypokalemic.  This was repleted IV.  The patient was given supportive  care, IV fluids, IV Reglan.  Her antibiotic was changed to oral  vancomycin..  The patient had  multiple episodes of tearfulness, severe  anxiety.  She continued to have episodes of loud retching, but no  evidence of emesis.  These episodes seemed to stop when she was talking  and accelerated when family arrived.  I encouraged calming techniques,  and encouraged her to try eating.  The patient definitely has  physiologic reasons for her current vomiting episodes.  However, I  suspect there is a large element of psychiatric overlay.  Some of her  vomiting episodes may be somewhat functional.  I discussed my concerns  with the patient and  family, and encouraged patient to seek the assistance of a psychiatrist  and/or therapist should she continue to have problems with anxiety.  Nevertheless, her diet was able to be quickly advanced, and she was able  to be discharged home.  She was on 23-hour observation.      Corinna L. Lendell Caprice, MD  Electronically Signed     CLS/MEDQ  D:  10/22/2008  T:  10/22/2008  Job:  [id number]   cc:   Anselmo Rod,  M.D.  Fax: [phone]   Chales Salmon. Abigail Miyamoto, M.D.  Fax: [phone]

## 2011-04-10 NOTE — H&P (Signed)
[name], [name]                 ACCOUNT NO.:  [id number]   MEDICAL RECORD NO.:  [id number]          PATIENT TYPE:  OBV   LOCATION:  1343                         [facility]ACILITY:  [facility]   PHYSICIAN:  [name], MDDATE OF BIRTH:  January 27, 1956   DATE OF ADMISSION:  10/20/2008  DATE OF DISCHARGE:  10/22/2008                              HISTORY & PHYSICAL   CHIEF COMPLAINT:  Vomiting.   HISTORY OF PRESENT ILLNESS:  [name] is a 55 year old white female  with multiple admissions for vomiting and gastroparesis who presents  with vomiting.  She was discharged on October 14, 2008 and had been  diagnosed with C diff colitis.  She was sent home on Flagyl.  The  patient feels that the Flagyl may be making her sick.  Her diarrhea has  improved somewhat, but her stool still is loose.  She has no specific  abdominal pain.  She is very anxious and received a dose of Ativan in  the emergency room.  She reports intractable vomiting and inability to  keep meds down, fluids down and solids down so she called Dr. Loreta [name] who  recommended she present to the emergency room.  The patient has received  multiple antiemetics in addition to the Ativan.  She has also received  IV fluids and apparently still  could be heard across the emergency room  retching.  The patient reports that she is currently feeling better, but  that she gets nauseated whenever she moves again.  She has had no  hematemesis.  The ER physician said that he saw no actual emesis, just  the loud retching noises.   PAST MEDICAL HISTORY:  1. C diff colitis, currently being treated.  2. And gastroparesis:  Apparently Dr. Loreta [name] has referred the patient to      a physician in [place].  3. Anxiety.  4. Depression.  5. Fibromyalgia.  6. Obesity.  7. Hypertension.  8. Gastroesophageal reflux disease.  9. History of migraines.  10.Peripheral neuropathy.  11.Status post cholecystectomy.   MEDICATIONS:  1. Domperidone 10 mg p.o.  q.i.d., although she just recently received      this because she gets it sent from [place].  2. Effexor 150 mg p.o. b.i.d.  3. Nexium 40 mg a day.  4. Alprazolam 0.25 one half tablet to one tablet as needed.  5. Topamax 50 mg nightly.  6. Triamterene hydrochlorothiazide 37.5/25 mg a day.  7. Lyrica 75 mg p.o. t.i.d.  8. Aspirin 81 mg a day.  9. Requip 0.5 mg nightly.  10.Sonata 10 mg nightly as needed.  11.Provigil 200 mg a day as needed.  12.Clonazepam 0.5 mg nightly as needed.  13.Zofran as needed.  14.Phenergan as needed.  15.She had been on Reglan, but this was stopped by Dr. Loreta [name] recently      for reasons unknown.  16.Also, she is on Flagyl 500 mg p.o. q.6 hours.   SOCIAL HISTORY:  Reviewed and as per previous.   FAMILY HISTORY:  Noncontributory.   REVIEW OF SYSTEMS:  Constitutional: no fevers, chills.  HEENT: No  headache, sore  throat, sinus congestion.  Respiratory: No cough or  shortness of breath.  Cardiovascular: No chest pains.  GI: As above.  GU: No dysuria or hematuria.  Musculoskeletal: No arthralgias or  myalgias.  Skin: No rash or jaundice.  Psychiatric:  She has been very  anxious and tearful.  Neurologic: No seizures.  Endocrine:  No diabetes.   PHYSICAL EXAMINATION:  Her blood pressure is 146/108, pulse 100,  respiratory rate 16, temperature 98.2.  GENERAL:  The patient is a groggy white female in no acute distress.  HEENT: Normocephalic, atraumatic.  Pupils equal, round, reactive to  light.  Sclerae are nonicteric.  Dry mucous membranes.  NECK: Supple.  LUNGS:  Clear to auscultation bilaterally without wheezes, rhonchi or  rales.  CARDIOVASCULAR:  Regular rate and rhythm without murmurs, gallops or  rubs.  ABDOMEN:  Normal bowel sounds, soft, nontender and nondistended.  GU/RECTAL: Exams were deferred.  EXTREMITIES: No clubbing, cyanosis or edema.  SKIN:  No rash.  No jaundice.  PSYCHIATRIC:  Normal affect, although the ER physician reports the   patient was very anxious earlier.  NEUROLOGIC: Nonfocal.   LABS:  White blood cell count is 17,000, hemoglobin 15.6, hematocrit 46,  potassium 3.3.  Sodium 140, chloride 111, bicarbonate 16, glucose 126,  BUN is 1, creatinine 0.92.  Urinalysis shows a specific gravity of  1.013, no ketones, no nitrites, trace leukocyte esterase.   ASSESSMENT/PLAN:  1. Reported intractable vomiting possibly related to Flagyl, although      the patient may be suffering effects from her Clostridium difficile      colitis and and/or gastroparesis.  She feels better, but is      concerned that her vomiting will return if discharged.  I will      place her on 23-hour observation of clear liquids, advance as      tolerated.  She will get Reglan as needed.  I will change her      Flagyl to vancomycin p.o. to see if she tolerates this any better.      Give proton pump inhibitor, IV fluids.  She does appear dehydrated      on exam, although strangely enough she has a normal specific      gravity, no ketones in her urine, and a BUN of only 1.  2. Hypokalemia.  This will be repleted IV.  3. Anxiety.  She will get small doses of Ativan as needed.  4. Clostridium difficile colitis, see above.  5. Gastroparesis, see above.  6. Fibromyalgia.  7. Gastroesophageal reflux disease.  8. Depression.  9. Peripheral neuropathy.      Leah L. Lendell Caprice, MD  Electronically Signed     CLS/MEDQ  D:  10/22/2008  T:  10/22/2008  Job:  [id number]   cc:   [name], M.D.  Fax: [phone]   [name], M.D.  Fax: [phone]

## 2011-04-13 NOTE — Op Note (Signed)
   NAMESEMA, STANGLER                           ACCOUNT NO.:  [id number]   MEDICAL RECORD NO.:  [id number]                   PATIENT TYPE:  INP   LOCATION:  5714                                 FACILITY:  [facility]   PHYSICIAN:  Graylin Shiver, M.D.                DATE OF BIRTH:  06-01-1956   DATE OF PROCEDURE:  03/29/2003  DATE OF DISCHARGE:                                 OPERATIVE REPORT   PROCEDURE PERFORMED:  Esophagogastroduodenoscopy with biopsy for CLO test.   INDICATIONS FOR PROCEDURE:  Persistent complaints of chronic nausea and  vomiting.  The patient was admitted to the hospital because of this.  Upper  endoscopy is being done to look for an abnormality in the upper GI tract  which might explain her symptoms.   DESCRIPTION OF PROCEDURE:  After informed consent was obtained, the patient  was premedicated with Cetacaine spray to the oropharynx, fentanyl 50 mcg IV  and Versed 5 mg IV.  She was in the left lateral decubitus position.  The  Olympus gastroscope was inserted into the oropharynx and passed into the  esophagus.  It was advanced down the esophagus, then into the stomach and  into the duodenum.  The second portion and bulb of the duodenum were normal.  The stomach had normal appearing mucosa in its entirety including the fundus  and cardia seen on retroflexion.  The esophagus looked normal in its  entirety with the esophagogastric junction being located at 35 cm from the  gums.  A CLO test was obtained from the distal stomach to see if there was  any evidence of Helicobacter pylori.  She tolerated the procedure well  without complications.   IMPRESSION:  Normal esophagogastroduodenoscopy.   COMMENT:  There is nothing specifically on this examination to explain the  symptoms of nausea and vomiting.  She has already had gallbladder testing  with a normal gallbladder ultrasound and a HIDA scan which did show  visualization of the gallbladder.  I will next order a solid  phase gastric  emptying study to see if there is any evidence of a gastric motility  disorder with delayed gastric emptying.                                               Graylin Shiver, M.D.    [name]  D:  03/29/2003  T:  03/29/2003  Job:  [id number]

## 2011-04-13 NOTE — Consult Note (Signed)
[name], [name]                           ACCOUNT NO.:  [id number]   MEDICAL RECORD NO.:  [id number]                   PATIENT TYPE:  INP   LOCATION:  5022                                 FACILITY:  [facility]   PHYSICIAN:  Marlan Palau, M.D.               DATE OF BIRTH:  [birth year]   DATE OF CONSULTATION:  05/11/2003  DATE OF DISCHARGE:                                   CONSULTATION   REASON FOR CONSULTATION:  The patient is a 55 year old right-handed white  female, born on [birth year], with a history of problems with fibromyalgia  and migraines.  The patient is admitted at this time for an evaluation of  gastroparesis that has been a problem for the last six months or so,  particularly bad over the last two months, with an associated 25 pound  weight loss in the last two months, according to the patient.  The patient  has not responded to multiple medications for gastroparesis such as Zelnorm  and Reglan anticholinergic medications.  The patient does report some  diarrhea on and off.  She has had some urinary frequency and occasional  incontinence for several years.  The patient denies any close association of  nausea and vomiting with her migraines.  The patient denies significant  problems with vision or problems with focusing, and denies problems with  numbness or weakness of the extremities or a burning sensation in the  fingers or toes.  The patient has been on Effexor and Topamax without recent  changes in dosing.  No changes within the last year or so.   PAST MEDICAL HISTORY:  1. Significant for a history of gastroparesis, above, treated.  2. A history of migraines.  3. Right arthroscopic knee surgery for probable fibromyalgia.  4. Obesity.   MEDICATIONS PRIOR TO ADMISSION:  1. Effexor 75 mg t.i.d.  2. Topamax 100 mg q.h.s.  3. Ambien 10 mg q. at night p.r.n.  4. Phenergan p.r.n. for nausea.  5. Protonix 40 mg daily.  6. Darvocet p.r.n.  7. Zelnorm one tablet  b.i.d.  8. Bentyl 20 mg q.i.d.   SOCIAL HISTORY:  The patient does not smoke or drink.  This patient is  married.  She lives in the [place] area.  Has worked in the past as a  Tax inspector.  Has two children who are alive and well.   ALLERGIES:  ERYTHROMYCIN.   FAMILY HISTORY:  Problems with organic brain syndrome and hypertension in  the mother.  Hypertension and heart disease, status post cardiac pacer  placement in the father.  Two sisters, one with asthma.   REVIEW OF SYSTEMS:  No fevers or chills.  The patient does note a headache  on average of once a week.  Some nausea and vomiting with the headache.  The  patient denies shortness of breath, chest pain, abdominal pain.  Denies any  weakness or numbness of the extremities.  The patient does report some  dizziness with standing at times.   PHYSICAL EXAMINATION:  VITAL SIGNS:  Blood pressure 113/71, heart rate 84,  respirations 20, temperature afebrile.  GENERAL:  This patient is a moderately-obese white female who is alert and  cooperative at the time of the examination.  HEENT:  Head atraumatic.  Pupils equal, round, reactive to light.  Discs are  flat bilaterally.  NECK:  Supple.  No carotid bruits.  LUNGS:  Respiration examination is clear.  CARDIOVASCULAR:  Reveals a regular rate and rhythm.  No obvious murmurs or  rubs.  EXTREMITIES:  Without significant edema.  NEUROLOGIC:  Cranial nerves as above.  Facial symmetry is present.  The  patient has good sensation of the face to pinprick and soft touch  bilaterally.  Has good strength of the facial muscles, the muscles of head  turning, shoulder shrug bilaterally.  Speech is well-enunciated, not  aphasic.  Again, the visual fields are full to double simultaneous  stimulation.  The patient has good strength in all fours.  Good symmetric  motor tone is noted throughout.  Sensory testing is intact to pinprick and  soft touch throughout, good sensation throughout.   The patient has good  finger-to-nose-to-finger, heel-to-shin.  The patient was not ambulated.  Deep tendon reflexes are symmetric.  Some depressed ankle jerk reflexes  noted bilaterally.  Toes are neutral bilaterally.  No drift is seen.   LABORATORY DATA:  For this admission are not available.   IMPRESSION:  1. History of persistent gastroparesis.  2. Migraine.  3. Fibromyalgia.   RECOMMENDATIONS:  This patient has persistent gastroparesis that has  resulted in significant weight loss, well documented by gastroenterological  studies.  The patient will require an evaluation for autonomic nervous  system dysfunction that could be leading to gastroparesis.  No history of  diabetes.  Need to rule out other entities such as amyloidosis or porphyria.  Need to consider the possibility of medication-induced problems such as  related with Effexor possibly.   PLAN:  1. Obtain blood work to include two-stage hemoglobin A1c, sedimentation     rate, serum protein electrophoresis, B12 level, hepatitis-B and C and     possibly cryoglobulin studies.  2. A 24-hour urine for delta ALA and porphobilinogens.  3. Orthostatic blood pressures to be checked.  4. Consider taper down and discontinuance of Topamax and Effexor.  Will follow her course while in-house.  Thank you very much.                                                Marlan Palau, M.D.    CKW/MEDQ  D:  05/11/2003  T:  05/11/2003  Job:  [id number]   cc:   Graylin Shiver, M.D.  [address]  [address]  [place], [place] [address]  Fax: [phone]

## 2011-04-13 NOTE — H&P (Signed)
[name], [name]                 ACCOUNT NO.:  [id number]   MEDICAL RECORD NO.:  [id number]          PATIENT TYPE:  INP   LOCATION:  2022                         FACILITY:  [facility]   PHYSICIAN:  Melissa L. Ladona Ridgel, MD  DATE OF BIRTH:  1956-02-13   DATE OF ADMISSION:  06/30/2005  DATE OF DISCHARGE:                                HISTORY & PHYSICAL   CHIEF COMPLAINT:  Chest pressure on and off.   PRIMARY CARE PHYSICIAN:  Dellis Anes. Idell Pickles, M.D.   HISTORY OF THE PRESENT ILLNESS:  This patient is a 55 year old white female  with a past medical history positive for atypical chest discomfort who  states that yesterday evening she started having chest pressure.  That night  she went to bed and woke up again this morning with chest discomfort.  It,  however, did not awake her from sleep.  The patient states that later this  afternoon the pain began to radiate to the left chest and to the left jaw,  therefore she came in for further evaluation to the urgent care center who  sent her to the emergency room.   The patient received numerous doses of pain medication, nitro and aspirin,  which did alleviate her pain somewhat, but also exacerbated her current  migraine.  The patient relates that on Wednesday she was treated with a  small dose of prednisone for her migraine, which would not let up.  It was  after taking the prednisone that she noticed the unusual chest discomfort.  The patient saw her primary care physician on Thursday who noted her blood  pressure to be slightly low and therefore decreased her atenolol by half and  increased her Nexium by two in response to her complaint of intermittent  chest discomfort.   REVIEW OF SYSTEMS:  No fevers or chills.  Positive nausea occasionally.  No  diarrhea or constipation.  She has not been feeling like eating the last  couple of days.  She has had a migraine for over a week.  She has stale  weight.  No melena, hematochezia or dysuria.  All other  review of systems  are negative.   PAST MEDICAL HISTORY:  The past medical history is significant for severe  GERD.  She had a negative stress test in March.  She had an EGD in 2004 and  a colonoscopy with Dr. Loreta Ave, which were within normal limits.  She get  migraines.  She has fibromyalgia, gastroparesis and sleep apnea.   PAST SURGICAL HISTORY:  The patient has had a cholecystectomy and her right  knee operation.   SOCIAL HISTORY:  The patient denies tobacco.  Denies ethanol.  She works as  a Scientist, forensic.  She is married.   FAMILY HISTORY:  Mother is living with a murmur, probable mitral valve  prolapse and hypertension.  Father is living with a pacemaker and irregular  heart rhythm, hypertension, macular degeneration, and increased cholesterol.   ALLERGIES:  Allergies are to ERYTHROMYCIN, which causes severe abdominal  pain.   MEDICATIONS:  The patient's medications are:  1.  Effexor 150 mg twice a day.  2.  Nexium 40 mg twice a day.  3.  Atenolol 50 mg once daily.  4.  Alprazolam 0.25 mg 1/2-1 tablet as needed.  5.  Lyrica 75 mg at bedtime.  6.  Fish oil 1,000 mg twice a day.  7.  Amitriptyline 62.5 mg at bedtime.  8.  Xanax XR 0.5 mg p.r.n.  9.  Triamterene hydrochlorothiazide 37.5/25 mg daily.   PHYSICAL EXAMINATION:  VITAL SIGNS:  Temperature is 97.8, blood pressure  112/64, pulse is 82, respirations 18, and saturation 99%.  GENERAL APPEARANCE:  Generally her affect is flat.  She is in no acute  distress.  HEENT:  The patient is normocephalic and atraumatic.  Pupils equal, round  and react to light.  Extraocular muscles are intact.  Mucous membranes are  moist.  NECK:  The patient's neck is supple.  There is no JVD, no lymph nodes and no  carotid bruits.  CHEST:  The patient's chest is clear to auscultation with decreased breath  sounds bilaterally.  She has no rhonchi, rales or wheezes.  HEART:  Cardiovascular has regular rate and rhythm.  Positive S1  and S2.  No  S3 or S4.  No murmurs, rubs or gallops are noted, and her heart sounds are  distant.  ABDOMEN:  The abdomen is soft, obese, nontender and nondistended with  positive bowel sounds.  There is no guarding or rebound, and no  hepatosplenomegaly.  EXTREMITIES:  The extremities show no clubbing, cyanosis or edema.  NEUROLOGIC EXAMINATION:  Neurologically she is alert, awake and oriented.  Cranial nerves II-XII are intact.  Power is 5/5.  DTRs are 2+.   LABORATORY DATA:  White count is 10, hemoglobin 12.9, hematocrit 38.6 and  platelets are 460,000.  Her sodium is 137, potassium 2.5, chloride 103, CO2  27, BUN 16, creatinine 1.0.  Urine pregnancy was negative.  Point of care  enzymes were negative times two.  EKG showed a heart rate of 66  beats/minute, normal sinus rhythm with no ST-T wave changes that are  suggestive of an acute myocardial infarction, but she has right atrial  enlargement.  Her chest x-ray shows no acute disease.   ASSESSMENT AND PLAN:  This is a 55 year old white female with a past medical  history of fibromyalgia, severe reflux and recent stress testing for  atypical chest pain.  She presents with 1.5 days of chest discomfort with  some relief after multiple doses of nitroglycerin and morphine.  The  inciting factor for possible gastroesophageal reflux disease flare is maybe  due to the fact that she took some prednisone on Wednesday to try to break  her migraine headache, which has been present for almost a week o this may  represent cardiac in origin chest pain, which we feel is less likely.   1.  Gastrointestinal; probable gastroesophageal reflux disease flare.   We will continue her Nexium at twice a day dosing.  She has a history of  gastroparesis; we may wish to consider adding Reglan.  We will check an  amylase and lipase to rule out pancreatitis.  1.  Cardiovascular; she has atypical chest discomfort.   We will send serial cardiac enzymes,  continue her atenolol and triamterene  hydrochlorothiazide with hold parameters.  We will get in touch with Dr.  Amil Amen in the morning to let him know that she is here.   1.  At this point I am holding her aspirin secondary to  her gastrointestinal      discomfort.  I will start her on Lovenox ACS protocol.   1.  Pulmonary; she has no complaints or issues.   The chest x-ray is clear.  We will do incentive spirometry.   1.  Genitourinary; she has no complaints of dysuria.   We will follow her potassium and Inputs and outputs while on  hydrochlorothiazide.   1.  Psychiatry/neurological; she has fibromyalgia for which we will continue      her Lyrica, her alprazolam and her amitriptyline.   For her migraines I will provide her with Darvocet p.r.n., but will hold her  Axert until we discover whether this chest pain is cardiac in origin.       MLT/MEDQ  D:  07/01/2005  T:  07/01/2005  Job:  [id number]   cc:   Dellis Anes. Idell Pickles, M.D.  [address]  [place]  [place] [address]  Fax: [phone]   Francisca December, M.D.  [address]  [address]  [place]  [place] [address]  Fax: [phone]

## 2011-04-13 NOTE — H&P (Signed)
[name], [name]                           ACCOUNT NO.:  [id number]   MEDICAL RECORD NO.:  [id number]                   PATIENT TYPE:  EMS   LOCATION:  ED                                   FACILITY:  [facility]   PHYSICIAN:  Graylin Shiver, M.D.                DATE OF BIRTH:  [birth year]   DATE OF ADMISSION:  04/11/2004  DATE OF DISCHARGE:                                HISTORY & PHYSICAL   CHIEF COMPLAINT:  Persistent nausea and vomiting.   HISTORY OF PRESENT ILLNESS:  This patient is a 55 year old Caucasian female  with a history of gastroparesis and tachygastria.  She has had multiple  admissions to the hospital over the past year because of nausea and vomiting  secondary to the above.  She has had documented abnormal nuclear medicine  gastric emptying studies in the past and also has had an abnormal  electrogastrogram done at [facility] in the past.  Because  of her ongoing complaints, she was referred to the [facility], in  [place], [place], where she was seen only a few weeks ago and a repeat  gastric emptying study at that time was normal.  It was felt that she no  longer had gastroparesis.  It was felt by the GI Department at [facility]  that she probably had a prolonged post viral gastroparesis which had  resolved.  The patient came home, she was feeling well.  I saw her in the  office not long ago and she was feeling well.  She, however, has been  complaining of persistent nausea and vomiting over the past 48 hours, being  unable to keep anything down.  She came to the ER at [facility]  yesterday where she was seen and evaluated.  It was not felt that she needed  to be admitted at that time.  She called the office, after leaving the  emergency room yesterday, stating that she was continuing to have nausea and  vomiting and she called multiple times this morning and it was elected to  readmit her to the hospital.   PAST HISTORY:   ALLERGIES:  ERYTHROMYCIN.   MEDICAL HISTORY:  1. Migraine headache.  2. History of gastroparesis.   MEDICATIONS:  1. Effexor 75 mg t.i.d.  2. Protonix 40 mg b.i.d.  3. Promethazine 25 mg p.r.n.  4. Prochlorperazine 5 mg t.i.d.  5. Atenolol 50 mg daily.  6. Alprazolam 0.25 mg 1/2 to 1 tablet p.r.n.  7. Zofran 4 mg q.i.d. p.r.n.  8. Amitriptyline 10 mg at bedtime.   PRIOR SURGERY:  Knee surgery.   SYSTEMS REVIEW:  No fever, no chills, no abdominal pain.  No chest pain,  shortness of breath, cough or sputum production.   PHYSICAL:  She does not appear in any acute distress, she is nonicteric.  NECK:  Is supple.  Mucous membranes are moist.  HEART:  Regular rhythm, no murmurs.  LUNGS:  Are clear.  ABDOMEN:  Bowel sounds are normal, soft, nontender.  No hepatosplenomegaly.  EXTREMITIES:  No edema.   IMPRESSION:  Recurrent nausea and vomiting in a patient with a prior history  of gastroparesis and tachygastria.  At this time I am not sure if the  problem is secondary to gastroparesis since she apparently had a normal  gastric emptying study at the [facility] in [place], [place] a few  weeks ago.  I am not sure whether we might not be dealing with another  condition, i.e. cyclical vomiting syndrome.   PLAN:  Admit to the hospital.  She will need IV hydration.  We were unable  to get a peripheral IV line at this time and if we are unable to get an IV  line in a PICC line will have to be placed.  I will give her antiemetics.  I  will proceed with a repeat nuclear medicine gastric emptying study.                                               Graylin Shiver, M.D.    SFG/MEDQ  D:  04/11/2004  T:  04/11/2004  Job:  [id number]   cc:   Dellis Anes. Idell Pickles, M.D.  [address]  [place]  [place] [address]  Fax: [phone]

## 2011-04-13 NOTE — H&P (Signed)
[name], [name]                           ACCOUNT NO.:  [id number]   MEDICAL RECORD NO.:  [id number]                   PATIENT TYPE:  INP   LOCATION:  0103                                 FACILITY:  [facility]   PHYSICIAN:  Angelia Mould. Derrell Lolling, M.D.             DATE OF BIRTH:  Aug 01, 1956   DATE OF ADMISSION:  08/09/2004  DATE OF DISCHARGE:                                HISTORY & PHYSICAL   CHIEF COMPLAINT:  Abdominal pain and gallstones.   HISTORY OF PRESENT ILLNESS:  This is a 55 year old white female, who gives a  1 year history of intermittent nausea and vomiting.  During the majority of  the year, she has not had much pain and denies having any fever, diarrhea,  or change in her bowel habits.  She states she has been worked up  extensively by Dr. Wandalee Ferdinand, which has included an upper endoscopy.  She  states that was normal.  She has also had a gastric emptying study which  initially was abnormal but perhaps has had another one which was normal.  She has received a variety of treatments.  She states she had an ultrasound  of her gallbladder 1 year ago which was normal.  About a week and a half  ago, she had an episode of severe epigastric pain, associated with nausea  and vomiting, and that then resolved.  She was seen at [facility].  About 1 week ago, a gallbladder ultrasound was obtained.  Today, the ultrasound was done which revealed a thickened gallbladder with  gallstones.  She was sent to the emergency room for evaluation for acute  cholecystitis.  She is admitted for cholecystectomy.   PAST MEDICAL HISTORY:  1.  Reportedly has been diagnosed with gastroparesis.  2.  She states that she was told that that has resolved.  3.  She has had an upper endoscopy which she says was normal.  4.  She has had a right knee arthroscopy.  5.  She states that she has fibromyalgia.  6.  She has migraine headaches.  7.  Denies hypertension, diabetes, or asthma.   CURRENT MEDICATIONS:  1.  Atenolol, dose unknown.  2.  Amitriptyline 50 mg q.h.s.  3.  Xanax dose unknown.  4.  Phenergan p.r.n. nausea.  5.  Effexor 175 mg p.o. b.i.d.  6.  Compazine p.r.n. nausea.  7.  Protonix 40 mg b.i.d.   DRUG ALLERGIES:  ERYTHROMYCIN.   SOCIAL HISTORY:  The patient is married and lives in [place] with her  husband.  They have two children.  She denies the use of alcohol or tobacco.  She works for [employer] in Clinical biochemist.   FAMILY HISTORY:  Mother is living, has hypertension.  Father living, has  hypertension, diabetes, and a pacemaker, two sisters living, one with  asthma.   REVIEW OF SYSTEMS:  All systems are reviewed.  They are documented on the  chart and noncontributory except as described above.  Specifically, she has  no cough, chest pain, or sputum production.  She has no history of cardiac  disease or arrhythmias or palpitations.  She has no voiding trouble,  urologic symptoms.   PHYSICAL EXAMINATION:  GENERAL:  Somewhat overweight, pleasant, white female  in minimal distress.  VITAL SIGNS:  Initial pulse was 126 is now 80, respirations 24, blood  pressure 142/93.  EYES:  Sclerae clear.  Extraocular movements intact.  EAR/NOSE/MOUTH/THROAT:  Nose, lips, tongue, and oropharynx are without gross  lesions.  NECK:  Supple, nontender.  No adenopathy.  No jugular venous distension.  LUNGS:  Clear to auscultation.  No chest wall tenderness.  HEART:  Regular rate and rhythm.  No murmur.  Radial femoral and posterior  tibial pulses are palpable.  No peripheral edema.  BREASTS:  Not examined.  ABDOMEN:  Somewhat obese.  Soft but really not very tender at all.  Perhaps  a little bit of subjective tenderness to deep palpation in the right upper  quadrant but not impressive.  No palpable mass.  Liver and spleen not  enlarged.  No hernia noted.  GENITOURINARY:  There is no inguinal adenopathy or hernia.  EXTREMITIES:  She moves all four  extremities well without pain or deformity.  NEUROLOGIC:  No gross motor or sensory deficits.   ADMISSION DATA:  1.  Ultrasound shows a thickened gallbladder wall on ultrasound and      gallstones.  2.  White blood cell count 13,400.  Urinalysis normal.  Liver function tests      normal.  Amylase and lipase normal.   IMPRESSION:  1.  Possible subacute cholecystitis with cholelithiasis.  It is not clear      whether she has been having biliary colic for the last year or whether      there is overlap with her gastroparesis.  Nevertheless, she does have a      diseased gallbladder.  2.  Fibromyalgia.  3.  Migraine headaches.  4.  Question of gastroparesis.   PLAN:  1.  The patient will be taken to the operating room for laparoscopic      cholecystectomy and cholangiogram.  2.  I have discussed the indications and details of surgery with her.  Risks      and complications have been outlined, including but not limited to      bleeding, infection, conversion to open laparotomy, injury to the main      bile duct or      intestine with major reconstructive surgery, wound problems such as      infection or hernia, cardiac pulmonary thromboembolic problems.  She      seems to understand these issues well.  At this time, all of her      questions are answered.  She agrees to the plan and wants to go ahead      with the surgery at this time.                                               Angelia Mould. Derrell Lolling, M.D.    HMI/MEDQ  D:  08/09/2004  T:  08/09/2004  Job:  [id number]   cc:   Chales Salmon. Abigail Miyamoto, M.D.  [address]  [place]  [place] [address]  Fax: [phone]

## 2011-04-13 NOTE — Discharge Summary (Signed)
[name], [name]                           ACCOUNT NO.:  [id number]   MEDICAL RECORD NO.:  [id number]                   PATIENT TYPE:  INP   LOCATION:  0472                                 FACILITY:  [facility]   PHYSICIAN:  Petra Kuba, M.D.                 DATE OF BIRTH:  28-May-1956   DATE OF ADMISSION:  04/11/2004  DATE OF DISCHARGE:  04/16/2004                                 DISCHARGE SUMMARY   HISTORY:  Patient admitted by my partner, Wandalee Ferdinand, who knows the patient  well with multiple admissions for her recurrent nausea, vomiting, and  presumed gastroparesis.   Patient was treated in the usual fashion with IV fluids, IV hydration, and  IV medicines.  She did have a repeat gastric emptying study done which was  better than previous, although it sounds like she had a normal one in  [place].  It was slightly normal.  Her dose of Elavil was increased on  the 20th.  On May 21, Dr. Evette Cristal thought she would be ready for discharge  based on how well she did on the 20th.  She was still retching.  On the  22nd, she had some nausea but no vomiting.  No more retching.  Was able to  keep some foods down.  She had some lower abdominal discomfort which  probably was helped with the bowel movement.  Her potassium was a little  low, but we decided she would be okay for outpatient management.   CONDITION ON DISCHARGE:  Improved.   DISCHARGE DIAGNOSES:  1. Nausea, vomiting, gastroparesis.  2. Depression.   DISCHARGE INSTRUCTIONS:  1. Resume home medicines.  2. Increase Elavil to 25 mg at night.  3. Restart her Zelnorm 2 times a day for four meals.  4. Pain management is called if that gets worse.   ACTIVITY:  Slowly advance as tolerated.   DIET:  Slowly advance as tolerated.   WOUND CARE:  Not applicable.   SPECIAL INSTRUCTIONS:  Call if symptoms get worse.   FOLLOW UP:  With Dr. Adriana Simas on Wednesday, which she already has that  appointment at [facility].  I have asked her to  call Dr. Evette Cristal on Thursday  to discuss his recommendations and decide when Dr. Evette Cristal needs to see him  back.  She knows to call us sooner or p.r.n.                                               Petra Kuba, M.D.    MEM/MEDQ  D:  04/16/2004  T:  04/17/2004  Job:  [id number]   cc:   Dellis Anes. Idell Pickles, M.D.  [address]  [place]  [place] [address]  Fax: [phone]   Dr. Adriana Simas  [facility]

## 2011-04-13 NOTE — Discharge Summary (Signed)
[name], [name]                           ACCOUNT NO.:  [id number]   MEDICAL RECORD NO.:  [id number]                   PATIENT TYPE:  INP   LOCATION:  5022                                 FACILITY:  [facility]   PHYSICIAN:  [name], M.D.                DATE OF BIRTH:  [birth year]   DATE OF ADMISSION:  05/10/2003  DATE OF DISCHARGE:  05/17/2003                                 DISCHARGE SUMMARY   REASON FOR ADMISSION:  This patient is a 55 year old Caucasian female who  was readmitted to the hospital with persistent complaints of nausea,  vomiting and dry heaves.  She has a diagnosis of gastroparesis and  tachygastria.  She has had multiple admissions to this hospital over the  last couple of months because of persistent complaints of nausea and  vomiting.  The patient has been to [facility] where she  had an electrogastrogram which was compatible with tachygastria.  Prior to  this admission, she had been in the hospital and in fact, was discharged  five days prior to this admission.  She did fine for a day or two and then  started having problems again with nausea and vomiting and she called with  persistent complaints of nausea and vomiting and was readmitted to the  hospital.   At home, prior to this admission, she had been on Zelnorm 6 mg b.i.d.,  Topamax 100 mg q.h.s., Effexor 75 mg three pills daily, Ambien 10 mg h.s.,  Phenergan suppository, Protonix and Bentyl 10 mg suspension q.i.d. which was  attempted to be raised to 20 mg q.i.d. but patient had vomiting and could  not keep it down.   PHYSICAL EXAMINATION:  GENERAL:  On admission, she did not appear in any  acute distress.  HEART:  Regular rhythm, no murmurs.  LUNGS:  Clear.  ABDOMEN:  Soft, nontender.   INITIAL LABORATORY DATA:  White blood cell count 11,600, H&H 14.4 and 43.3.  Sodium 141, potassium 3.2, chloride 111, CO2 25, liver enzymes normal.   HOSPITAL COURSE:  The patient was  admitted to the hospital for intravenous  fluids for hydration.  She was also given antiemetics.  Her Zelnorm was  continued at 6 mg p.o. t.i.d. and Bentyl was increased to 20 mg p.o. q.i.d.  Phenergan was used IV for antiemetic purposes.  She was also continued on  her home medications.  The patient's stay in the hospital was prolonged  simply because she continued to have dry heaves, nausea and intermittent  vomiting and was not ready to go home.  Because of her prior history, it was  felt that if discharged too early, she would call again and have to be  readmitted.  Her case was discussed with Dr. Alycia Rossetti at [facility], who was familiar with her electrogastrogram and adjustments in  medications were made here.  BuSpar  was added to her medication regimen at a  dose of 5 mg p.o. t.i.d. to try to help relax the smooth muscle of the  stomach.  She continued, however, to have nausea and wretching.  The patient  was seen in consultation by [name], M.D. from Neurology with intent  of looking for any type of autonomic neurological dysfunction which might  explain her symptoms.  Lab tests were obtained and were unrevealing.  A 24  hour urine for Delta ALA and porphobilinogens was ordered, but the patient  started her menstrual period and the test was held.  After following her for  several days in the hospital and discussions with Dr. Alycia Rossetti at [facility], it was elected to discharge her from [facility] and  transfer her to [facility] where she could be seen directly by Dr. Alycia Rossetti.  I  spoke to Dr. Alycia Rossetti on May 17, 2003 and he accepted her in transfer to see if  there was anything further that could be done for her persistent symptoms of  nausea and vomiting secondary to her gastroparesis and tachygastria.  She  left the hospital in good condition.  She was transferred to [facility] per her  request.    IMPRESSIONS:  1. Recurring episodes of nausea, vomiting  and dry heaves secondary to     gastroparesis, which is secondary to tachygastria.  2. History of migraine headaches.  3. History of fibromyalgia.   PLAN:  The patient was transferred to [facility].  Her husband took  her.                                               [name], M.D.    [name]  D:  05/18/2003  T:  05/19/2003  Job:  [id number]   cc:   [name], M.D.  [address]  [address]  [place], [place] [address]  Fax: [phone]   [name]. [name], M.D.  [address]  [place]  [place] [address]  Fax: [phone]   [name], M.D.  [address]  [place]  [place] [address]  Fax: [phone]   [facility] , [facility] Dr. Iantha Fallen [name]    cc:   [name], M.D.  [address]  [address]  [place], [place] [address]  Fax: [phone]   [name]. [name], M.D.  [address]  [place]  [place] [address]  Fax: [phone]   [name], M.D.  [address]  [address]  [place]  [place] [address]  Fax: [phone]   [facility] , [facility] Dr. Iantha Fallen [name]

## 2011-04-13 NOTE — H&P (Signed)
[name], [name]                           ACCOUNT NO.:  [id number]   MEDICAL RECORD NO.:  [id number]                   PATIENT TYPE:  INP   LOCATION:  5739                                 FACILITY:  [facility]   PHYSICIAN:  Petra Kuba, M.D.                 DATE OF BIRTH:  01-12-1956   DATE OF ADMISSION:  04/10/2003  DATE OF DISCHARGE:                                HISTORY & PHYSICAL   HISTORY OF PRESENT ILLNESS:  The patient with a recent admission for nausea,  vomiting, and gastroparesis with nondiagnostic workup to date except for a  gastric emptying study.  Workup to include a CT, ultrasound, PIPIDA, and  small bowel follow through which were all normal.  A delayed gastric study  showed 93% retained food.  She has been referred to [facility] but has not  been to the clinic yet.  She does have a test scheduled there for Friday.   She called me yesterday saying her symptoms were no better.  She had  frequent retching not really much vomiting.  I did ask her to increase the  Reglan over the phone to 15 mg four times a day but today she was no better.  She called to give me the update.  We asked her to come to the emergency  room, where although her labs, x-rays, and physical exam were all fine, she  was admitted for rehydration, IV medication, and possibly further workup and  plans.   PAST MEDICAL HISTORY:  Pertinent for migraines, fibromyalgia, and knee  surgery.   ALLERGIES:  ERYTHROMYCIN.   FAMILY HISTORY:  Negative for any obvious GI problems, urologic problems, or  autoimmune problems.   SOCIAL HISTORY:  Does not smoke or drink.   MEDICATIONS:  Over-the-counter medicines.   REVIEW OF SYMPTOMS:  Negative for any skin lesions, joint swelling, swollen  glands, or any other specific complaints.  She has not been on any new  medicines and has no specific sick contacts.   PHYSICAL EXAMINATION:  GENERAL:  No acute distress.  VITAL SIGNS:  See chart.  Afebrile.  HEENT:   Sclerae nonicteric.  NECK:  Supple without adenopathy.  LUNGS:  Clear.  HEART:  Regular rate and rhythm.  ABDOMEN:  Soft.  Nontender.  No obvious succussion or splash.  EXTREMITIES:  No pedal edema.   LABORATORY DATA:  Today's labs and x-rays were reviewed.  All normal.  Specifically normal BUN, creatinine, and CBC as well as lipase and amylase.   ASSESSMENT:  1. Nausea, vomiting, dehydration, delayed gastric emptying.  2. History of migraines.  3. Fibromyalgia.   PLAN:  IV fluids and IV medicines.  Possibly transfer to [facility] per Dr.  Graylin Shiver and discuss the case with them and move up her appointment.  I have discussed all the above with the husband.  We will go ahead and  check  a sedimentation rate and ANA just to make sure something like scleroderma is  not playing a role.  Further workup and plans pending above.                                               Petra Kuba, M.D.    MEM/MEDQ  D:  04/10/2003  T:  04/10/2003  Job:  [id number]   cc:   Petra Kuba, M.D.  [address]  [place]  [place] [address]  Fax: [phone]   Graylin Shiver, M.D.  [address]  [address]  [place], [place] [address]  Fax: [phone]   Chales Salmon. Abigail Miyamoto, M.D.  [address]  [place]  [place] [address]  Fax: [phone]

## 2011-04-13 NOTE — Discharge Summary (Signed)
   NAMEARNETRA, [name]                           ACCOUNT NO.:  [id number]   MEDICAL RECORD NO.:  [id number]                   PATIENT TYPE:  INP   LOCATION:  5710                                 FACILITY:  [facility]   PHYSICIAN:  Graylin Shiver, M.D.                DATE OF BIRTH:  06/14/1956   DATE OF ADMISSION:  05/27/2003  DATE OF DISCHARGE:  05/30/2003                                 DISCHARGE SUMMARY   REASON FOR ADMISSION:  The patient was a 55 year old Caucasian female  readmitted to the hospital with persistent complaints of nausea, vomiting,  and dry heaves.  She had persistent gastroparesis and tachygastria.  She has  multiple admissions to the hospital for this.  She had been home for a few  days and then started having problems with nausea, vomiting, and dry heaves  again.  She was unable to keep anything down.  She was therefore readmitted  to the hospital.   PAST MEDICAL HISTORY:  See H&P.   PHYSICAL EXAMINATION:  See H&P.   HOSPITAL COURSE:  The patient was admitted to the hospital.  She was started  on intravenous fluids for hydration.  She was given potassium  supplementation.  She was given IV Phenergan and started on home medications  as tolerated.  Her Topamax was discontinued while in the hospital.  She  progressed well over the next couple of days.  Her diet was advanced and she  was discharged on May 30, 2003.   FINAL DIAGNOSES:  1. Gastroparesis.  2. Mild hypokalemia.   DISCHARGE MEDICATIONS:  She was discharged home on her prior home  medications except for Topamax.  1. Zelnorm 6 mg p.o. t.i.d.  2. Effexor 75 mg three tablets daily.  3. Ambien 2.5 mg at bedtime.  4. Phenergan suppository p.r.n.  5. Protonix 40 mg b.i.d.  6. NuLev 0.125 mg every _____ hours.   DISCHARGE INSTRUCTIONS:  The patient was to be on a low residue, low fat  diet as tolerated.   FOLLOW UP:  She was scheduled to see Dr. Alycia Rossetti at [facility] in a few days  post discharge.                                                Graylin Shiver, M.D.    [name]  D:  06/22/2003  T:  06/23/2003  Job:  [id number]

## 2011-04-13 NOTE — Op Note (Signed)
[name], [name]                 ACCOUNT NO.:  [id number]   MEDICAL RECORD NO.:  [id number]          PATIENT TYPE:  AMB   LOCATION:  SDC                           FACILITY:  [facility]   PHYSICIAN:  Timothy P. Fontaine, M.D.DATE OF BIRTH:  December 23, 1955   DATE OF PROCEDURE:  08/09/2006  DATE OF DISCHARGE:                                 OPERATIVE REPORT   PREOPERATIVE DIAGNOSES:  1. Irregular menses.  2. Menorrhagia.   POSTOPERATIVE DIAGNOSES:  1. Irregular menses.  2. Menorrhagia.   PROCEDURES:  1. NovaSure endometrial ablation.  2. Dilatation and curettage.  3. Hysteroscopy.   SURGEON:  Timothy P. Fontaine, MD.   ANESTHETIC:  MAC with was paracervical block, 1% lidocaine.   SPECIMEN:  Endometrial curetting.   ESTIMATED BLOOD LOSS:  Minimal.   COMPLICATIONS:  None.   FINDINGS:  EUA:  External, BUS, vagina normal.  Cervix is grossly normal.  Bimanual:  Uterus grossly normal in size, limited by abdominal girth.  No  gross adnexal masses.  Hysteroscopic postablative with uniform treatment,  good distension, no evidence of perforation.   PROCEDURE:  The patient was taken to the operating room, underwent IV  sedation, was placed in low dorsal lithotomy position, received a perineal,  vaginal preparation with Betadine solution.  Bladder emptied with in-and-out  Foley catheterization.  EUA performed.  The patient was draped in the usual  fashion.  The cervix was visualized with a speculum, anterior lip grasped  with a single-tooth tenaculum and a paracervical block using 1% lidocaine  total of 9 mL placed.  The cervical and endometrial lengths were then  determined.  Then the cervix was gently dilated and the sharp curettage  performed.  The NovaSure instrument was then placed within the uterus,  opened, and manipulation assured appropriate placement.  Final width  calculations made.  The carbon dioxide test was performed and passed and  subsequently the ablation was performed  without difficulty.  The patient  tolerated it well.  The instrument was removed.  Hysteroscopy was performed with findings noted  above.  The instruments were removed.  Adequate hemostasis visualized, the  patient placed in supine position, awakened without difficulty, and taken to  the recovery in good condition, having tolerated the procedure well.      Timothy P. Fontaine, M.D.  Electronically Signed     TPF/MEDQ  D:  08/09/2006  T:  08/10/2006  Job:  [id number]

## 2011-04-13 NOTE — H&P (Signed)
NAMESHIZUE, [name]                           ACCOUNT NO.:  [id number]   MEDICAL RECORD NO.:  [id number]                   PATIENT TYPE:  INP   LOCATION:  1829                                 FACILITY:  [facility]   PHYSICIAN:  Graylin Shiver, M.D.                DATE OF BIRTH:  1956/03/22   DATE OF ADMISSION:  12/21/2003  DATE OF DISCHARGE:                                HISTORY & PHYSICAL   CHIEF COMPLAINT:  Wretching and vomiting.   HISTORY OF PRESENT ILLNESS:  This patient is a 55 year old Caucasian female  with a history of gastroparesis.  She has been diagnosed with gastroparesis  and tachygastria in the past.  She has a problem with markedly delayed  gastric emptying.  An electrogastrogram done at [facility] showed  tachygastria.  She sees Dr. Alycia Rossetti at [facility] for the above mentioned problem.  She presents to the emergency room today with a 36 hour problem of  persistent dry heaves and vomiting.  She cannot keep anything down.  She has  no abdominal pain.  She has had multiple admissions in the past for this  problem.  She has been doing well over the past five or six months.  She was  started on domperidone 10 mg t.i.d. by Dr. Alycia Rossetti a few months ago.  She  obtains this medication from [facility].  She is being admitted to the hospital  today because of her persistent dry heaving and vomiting.  She seems to have  responded in the past to being admitted for a few days.  It tends to break  the cycle.   ALLERGIES:  ERYTHROMYCIN .   CURRENT MEDICATIONS:  1. Effexor 75 mg t.i.d.  2. Protonix 40 mg b.i.d.  3. NuLev 0.125 mg t.i.d.  4. Zelnorm 6 mg t.i.d.  5. Darvocet-N 100 p.r.n.  6. Phenergan either oral or suppository 25 mg p.r.n.  7. Prochlorperazine 5 mg t.i.d.  8. The patient was recently on mirtazapine but this was discontinued because     she was gaining too much weight.  She has been, instead, placed on     trazodone 50 mg at bedtime but she stopped taking this.  She  thought this     might have been the source of her recent flare up.  9. Atenolol 50 mg daily.  10.      Domperidone 10 mg q.i.d. which she obtained from [facility].   PAST MEDICAL HISTORY:  1. Fibromyalgia.  2. Migraine headaches.  3. Depression.   SOCIAL HISTORY:  She does not smoke or drink alcohol.   FAMILY HISTORY:  Positive for colon cancer in an aunt.  Mother had  hypertension.   REVIEW OF SYMPTOMS:  She denies fever or chills.  She denies current  headache, neck pain, chest pain, shortness of breath, cough, sputum  production.  She has gained about 40 pounds in the last  six months.  This  attributes this to the use of mirtazapine which she states increased  appetite.   PHYSICAL EXAMINATION:  GENERAL APPEARANCE:  She does not appear in any acute  distress, although she is intermittently dry heaving.  VITAL SIGNS:  Blood pressure 121/89, pulse 117, respiratory rate 20.  She is  in no severe distress.  HEENT:  Unremarkable.  NECK:  Supple.  No masses, adenopathy or goiter.  LUNGS:  Clear.  CARDIOVASCULAR:  Regular rhythm.  No murmurs, rubs, or gallops.  ABDOMEN:  Bowel sounds are normal.  Abdomen is soft, nontender, no  hepatosplenomegaly.  EXTREMITIES:  No edema.  NEUROLOGIC:  Moves all extremities.  Deep tendon reflexes normal.   IMPRESSION:  A 55 year old female with a history of gastroparesis  (tachygastria) who presents with intractable dry heaving and vomiting, being  unable to keep anything down.   PLAN:  Admit to the hospital, provide IV fluids, antiemetic therapy, adjust  medications as needed to try to break the cycle of vomiting.                                                Graylin Shiver, M.D.    [name]  D:  12/21/2003  T:  12/21/2003  Job:  [id number]

## 2011-04-13 NOTE — Discharge Summary (Signed)
[name], [name]                 ACCOUNT NO.:  [id number]   MEDICAL RECORD NO.:  [id number]          PATIENT TYPE:  INP   LOCATION:  2022                         FACILITY:  [facility]   PHYSICIAN:  Corinna L. Lendell Caprice, MDDATE OF BIRTH:  1956-05-12   DATE OF ADMISSION:  06/30/2005  DATE OF DISCHARGE:  07/01/2005                                 DISCHARGE SUMMARY   DIAGNOSES:  1.  Chest pain, suspect esophagitis versus esophageal spasm.  2.  Migraine headache.  3.  Fibromyalgia.  4.  History of gastroparesis.  5.  Gastroesophageal reflux disease.   DISCHARGE MEDICATIONS:  She has been started on Reglan 10 milligrams p.o.  q.a.c. at h.s.  She is to continue her outpatient medications which include  Effexor and Nexium b.i.d., atenolol, Xanax as needed, Lyrica, fish oil  capsules, amitriptyline, triamterene, hydrochlorothiazide.   FOLLOW UP:  She is to follow up Dr. Corliss Marcus and is to call for an  appointment. She is to follow up with the headache clinic.   ACTIVITY:  Ad lib.   DIET:  Regular.   CONDITION:  Stable.   CONSULTATIONS:  None.   PROCEDURES:  None.   PERTINENT LABORATORY DATA:  Serial cardiac enzymes negative. CBC  unremarkable. CMET unremarkable. Pregnancy negative. Amylase lipase normal.  Special studies in radiology EKG shows sinus tachycardia, right atrial  enlargement, right axis deviation and no change from previous. Chest x-ray  negative.   HISTORY AND HOSPITAL COURSE:  [name] is a pleasant 55 year old white  female patient of Dr. Foye Deer and Dr. Corliss Marcus who presented to the  emergency room after [facility] with  atypical chest pain. She reported that she has been having chest pain on and  off for quite some time and had a negative stress test which was confirmed  by Dr. Amil Amen in March of this year. She described the chest pain as  substernal associated with some frothing. This was typical for her;  however,  the chest pain that was more severe and also she had an episode  that was involving the left chest, arm and neck. The patient thought that  there may be some worsening after anxiety. She has been taking at lot of  Excedrin for migraines recently up to five times a day and also had a dose  of prednisone. She noted that the chest pain worsened after she has been  taking these medicines for her migraine which has been continuous over the  past two weeks.  Please see H&P for complete details.   The patient was admitted to telemetry and ruled out for myocardial  infarction by enzymes. She remained in normal sinus rhythm. The chest pain  improved. I discussed the case with Dr. Amil Amen and I feel that this is GI  in nature rather than cardiac particularly given her negative stress test  earlier this year. He agreed that the patient could safely be sent home and  he will follow up in the office and consider repeat stress test.      Cammie Mcgee  Burman Freestone, MD  Electronically Signed     CLS/MEDQ  D:  07/01/2005  T:  07/01/2005  Job:  [id number]   cc:   Francisca December, M.D.  [address]  [place]  [place] [address]  Fax: [phone]   Dellis Anes. Idell Pickles, M.D.  [address]  [place]  [place] [address]  Fax: [phone]

## 2011-04-13 NOTE — Discharge Summary (Signed)
[name], [name]                           ACCOUNT NO.:  [id number]   MEDICAL RECORD NO.:  [id number]                   PATIENT TYPE:  INP   LOCATION:  5010                                 FACILITY:  [facility]   PHYSICIAN:  Graylin Shiver, M.D.                DATE OF BIRTH:  20-Mar-1956   DATE OF ADMISSION:  07/03/2003  DATE OF DISCHARGE:  07/06/2003                                 DISCHARGE SUMMARY   REASON FOR ADMISSION:  The patient is a 55 year old female with a history of  gastroparesis and tachygastria.  She was admitted to the hospital for  intractable nausea and vomiting.   PHYSICAL EXAMINATION:  See H&P.   HOSPITAL COURSE:  The patient was treated with intravenous fluids and  initially started on Reglan IV, Zelnorm p.o., Zofran IV.  She had an  uneventful hospital course and improved.  She subsequently had her IV  discontinued and was switched over to oral medications.  She continued to do  well.  She was discharged with a final diagnosis of  gastroparesis/tachygastria.   DISCHARGE MEDICATIONS:  1. Effexor 75 mg three p.o. daily.  2. Zelnorm 6 mg p.o. t.i.d.  3. NuLev one p.o. t.i.d.  4. Compazine 10 mg p.o. t.i.d.  5. Axert 6.75 mg p.r.n.  6. Protonix 40 mg b.i.d.   DIET:  She was to be on a liquid diet, advance as tolerated.   FOLLOWUP:  She was instructed to follow up with Dr. Herbert Moors at [facility] for her next scheduled appointment and me as necessary as well as  her primary care physician.                                                Graylin Shiver, M.D.    [name]  D:  11/09/2003  T:  11/09/2003  Job:  [id number]

## 2011-04-13 NOTE — H&P (Signed)
NAMEYONG, WAHLQUIST                           ACCOUNT NO.:  [id number]   MEDICAL RECORD NO.:  [id number]                   PATIENT TYPE:  INP   LOCATION:  1823                                 FACILITY:  [facility]   PHYSICIAN:  Althea Grimmer. Luther Parody, M.D.            DATE OF BIRTH:  13-Jan-1956   DATE OF ADMISSION:  07/03/2003  DATE OF DISCHARGE:                                HISTORY & PHYSICAL   HISTORY OF PRESENT ILLNESS:  Ms. Olthoff is a 55 year old female who has had  multiple admissions to the hospital this year for intractable nausea and  vomiting. She has been diagnosed at [facility] with tachygastria and  gastroparesis of unclear etiology. Intermittently, she has periods when she  tolerates a fluid and starch-filled diet but she frequently returns to the  hospital with dry heaves, dehydration and electrolyte disturbances. She has  lost 20 pounds since May.   Earlier this week, she was doing well and had actually gained a slight  amount of weight back. She had seen Dr. Claire Shown at [facility] and no  medications were changed. The following day she began to get ill and has  been vomiting continuously since last Thursday. In the past year, she has  had extensive gastrointestinal workup. Tests that have been recorded include  a negative abdominal ultrasonogram and negative HIDA scan, a negative  abdominal CT scan, a normal upper endoscopy.   She has had two nuclear gastric emptying studies, both of which demonstrate  poor gastric emptying. The most recent demonstrated 81% emptying at 60  minutes and only 57% at 120 minutes. These both, however, were mildly  improved over a prior test. A small bowel series demonstrated a questionable  thickening of the duodenum but this was not confirmed on the upper  endoscopy. It was also noted that there was rapid transit to the small bowel  once material was delivered there from the stomach.   LABORATORY DATA:  Tests have included a normal TSH,  B12, serum protein  electrophoresis. She has had negative Helicobacter pylori urease testing and  negative cryoglobulins.   PAST MEDICAL HISTORY:  Pertinent for fibromyalgia, migraines, and  depression.   CURRENT MEDICATIONS:  1. Effexor 75 mg t.i.d.  2. Ambien 5 mg at bedtime.  3. Compazine 5 mg q.i.d.  4. Protonix 40 mg b.i.d.  5. Darvocet p.r.n.  6. Zelnorm 6 mg t.i.d.  7. NuLev 0.125 mg a.c.  8. Axert p.r.n. migraines.   ALLERGIES:  ERYTHROMYCIN.   FAMILY HISTORY:  Notable for an aunt with colon cancer. Her mother was  hypertensive.   SOCIAL HISTORY:  She is married. Nonsmoker, nondrinker.   REVIEW OF SYSTEMS:  GENERAL:  There has been a 20 pound weight loss since  May. No night sweats. ENDOCRINE:  No known diabetes or thyroid problems. TSH  was recently normal. SKIN:  No rash or pruritus.  EYES:  No icterus or  change in vision. ENT:  No aphthous ulcers or chronic sore throat.  RESPIRATORY:  No shortness of breath, cough, or wheezing. CARDIAC:  No chest  pain, palpitations or history of valvular heart disease. GASTROINTESTINAL:  As above. GENITOURINARY:  No dysuria or hematuria. NEUROLOGICAL:  She has a  history of migraine headaches. The remainder of review of systems is  negative.   PHYSICAL EXAMINATION:  GENERAL:  She is a well-developed, mildly overweight  female in no acute distress.  VITAL SIGNS:  Afebrile, blood pressure 123/55, pulse 94 regular, respiratory  rate 20.  SKIN:  Normal.  HEENT:  Eyes anicteric. Funduscopic examination unremarkable. Oropharynx  normal.  NECK:  Supple without thyromegaly. There is no cervical or inguinal  adenopathy.  CHEST:  Sounds clear.  HEART:  Sounds regular rate and rhythm.  ABDOMEN:  Mildly obese. Does not appear distended. There are hypoactive  bowel sounds present. There is no mass or organomegaly.  RECTAL:  Examination not performed.  EXTREMITIES:  Without clubbing, cyanosis, or edema. No rash. Dorsalis pedis  pulse  2+ bilaterally.   IMPRESSION:  Idiopathic gastroparesis and tachygastria.   PLAN:  I know of very little else to offer this patient but will admit her  to the hospital. I am going to hold her outpatient oral medications. Start  her again on IV Reglan, Zofran p.r.n. and IV hydration with potassium. I  will give her oral Zelnorm at a somewhat lower dose, since her stools have  been somewhat loose lately. Case will be discussed with Dr. Evette Cristal and Dr.  Alycia Rossetti at [facility] may again be consulted.                                                Althea Grimmer. Luther Parody, M.D.    PJS/MEDQ  D:  07/03/2003  T:  07/03/2003  Job:  [id number]   cc:   Graylin Shiver, M.D.  [address]  [address]  [place], [place] [address]  Fax: [phone]

## 2011-04-13 NOTE — H&P (Signed)
[name], STREED                 ACCOUNT NO.:  [id number]   MEDICAL RECORD NO.:  [id number]           PATIENT TYPE:   LOCATION:                                FACILITY:  [facility]   PHYSICIAN:  Timothy P. Fontaine, M.D.DATE OF BIRTH:  06/14/1956   DATE OF ADMISSION:  DATE OF DISCHARGE:                                HISTORY & PHYSICAL   Being admitted 08/09/2006 for surgery at 9:30   CHIEF COMPLAINT:  Menorrhagia.   HISTORY OF PRESENT ILLNESS:  This 55 year old G-1, P41 female.  Vasectomy  birth control.  Admitted for endometrial ablation due to menorrhagia.  The  patient has experienced increasing menses lasting 7 days.  Bleed through  episodes, frequent pad changes which have become unacceptable.  She has been  counseled as to the risks, benefits, indications and alternatives and elects  for endometrial ablation.  The patient's evaluation includes normal thyroid  functions and a normal histogram.   PAST MEDICAL HISTORY:  1. Anxiety/depression.  2. Fibromyalgia.  3. Migraine headaches.  4. Restless leg syndrome.  5. Intermittent swelling.   PAST SURGICAL HISTORY INCLUDES:  1. Cholecystectomy.  2. Knee surgery.  3. Tonsillectomy and adenoidectomy.   CURRENT MEDICATIONS INCLUDE:  1. Effexor 150 mg twice daily.  2. Nexium 40 mg daily.  3. Xanax 0.25 as needed.  4. Amitriptyline 50 mg at bedtime,  5. Triamterene/hydrochlorothiazide 37.5/25 one-half tab daily.  6. Lyrica 0.75 mg daily.  7. Aspirin 81 mg daily.  8. Ditropan XL 10 mg daily.  9. Requip 0.5 bedtime.  10.Sonata 10 mg as needed.  11.Nuiron 150 mg daily.  12.Provigil 200 mg daily.   ALLERGIES:  E-MYCIN.   REVIEW OF SYSTEMS:  Noncontributory.   FAMILY HISTORY:  Noncontributory.   SOCIAL HISTORY:  Noncontributory.   ADMISSION PHYSICAL EXAMINATION:  VITAL SIGNS:  Afebrile.  Vital signs are  stable.  HEENT:  Normal.  LUNGS AND CARDIAC EXAM:  Per anesthesia day of surgery.  ABDOMEN:  Benign, without massed,  guarding, rebound, or organomegaly.  PELVIC:  External BUS, vagina normal, cervix normal.  Uterus normal size,  midline and mobile.  Adnexa without masses or tenderness.  RECTOVAGINAL EXAM:  Normal.   ASSESSMENT:  A 55 year old G1 P1 female.  Vasectomy birth control.  Normal  sound sonohystogram.  Normal thyroid functions.  Increasing menorrhagia.  Bleed through episodes.  Frequent pad changes, unacceptable to the patient.  For endometrial ablation.  I reviewed with the patient the expected  intraoperative, postoperative courses.  The risks involved, short-term and  long-term issues with endometrial ablation.  The patient understands that  there are no guarantees as far as menstrual regulation, that her periods may  persist, be heavy, become irregular, all of which she understands and  accepts.  The risks of infection, transfusion, uterine perforation, damage  to internal organs both directly or through thermal injury from the NovaSure  equipment leading to injuries of the bowel, bladder, ureters, vessels, and  nerves, necessitating major exploratory reparative surgeries, and future  reparative surgeries including ostomy formation was all discussed,  understood, and accepted.  Pregnancy is not an issue; and, again, she  understands that she may fail the procedure and continue to have heavy or  irregular periods.  The patient also understands that this is machine  dependent and if there is a malfunction that we may not be able to perform  the ablation the day of surgery.  The patient's questions are answered to  her satisfaction, and she is ready to proceed with surgery.      Timothy P. Fontaine, M.D.  Electronically Signed     TPF/MEDQ  D:  08/08/2006  T:  08/08/2006  Job:  [id number]

## 2011-04-13 NOTE — Op Note (Signed)
[name], KINKAID                           ACCOUNT NO.:  [id number]   MEDICAL RECORD NO.:  [id number]                   PATIENT TYPE:  INP   LOCATION:  0103                                 FACILITY:  [facility]   PHYSICIAN:  Angelia Mould. Derrell Lolling, M.D.             DATE OF BIRTH:  08-18-1956   DATE OF PROCEDURE:  08/09/2004  DATE OF DISCHARGE:                                 OPERATIVE REPORT   PREOPERATIVE DIAGNOSES:  Acute cholecystitis with cholelithiasis.   POSTOPERATIVE DIAGNOSES:  Chronic cholecystitis with cholelithiasis.   OPERATION:  Laparoscopic cholecystectomy.   SURGEON:  Angelia Mould. Derrell Lolling, M.D.   INDICATIONS FOR PROCEDURE:  This is a 55 year old white female who has a one  year history of intermittent episodes of nausea and vomiting.  During that  time she has really not had any pain or fever and has been worked up by Dr.  Evette Cristal and was thought to have gastroparesis.  About a week and a half ago,  she had an episode of severe epigastric pain associated with nausea and  vomiting. That subsequently resolved. A gallbladder ultrasound was ordered.  That was obtained today. The ultrasound shows a thick walled gallbladder and  gallstones.  This was read as acute cholecystitis by the radiologist.  She  was sent by Dr. Recardo Evangelist office to the emergency room. Lab work shows a  white count of 13,400 and normal liver function tests and normal amylase and  lipase. Evaluation today reveals that her abdomen is minimally tender,  perhaps not even tender at all.  Because of the abnormal ultrasound and  elevated white count and the absence of any other explanation, I offered  cholecystectomy to the patient. She decided that she wanted to go ahead and  have that done at this time and she is operated upon on the day of  admission.   FINDINGS:  The gallbladder looked chronically inflamed but was not acutely  inflamed.  The cystic duct was very tiny and I could not cannulate the  cystic duct  and ultimately found that the lumen was occluded for the most  part distally. The anatomy of the cystic duct and cystic artery looked  conventional.  The liver, stomach, duodenum, small intestine and large  intestine were grossly normal to inspection.   TECHNIQUE:  Following the induction of general endotracheal anesthesia, the  patient's abdomen was prepped and draped in a sterile fashion.  Marcaine  0.5% with epinephrine was used as a local infiltration anesthetic. A  transverse incision was made at the superior rim of the umbilicus. The  fascia was incised in the midline and the abdominal cavity entered under  direct vision. A 10 mm Hasson trocar was inserted and secured with a  pursestring suture of #0 Vicryl. Pneumoperitoneum was created. The video  camera was inserted with visualization and findings as described above. A 10  mm trocar was placed in the  subxiphoid region and two 5 mm trocars placed in  the right mid abdomen. We elevated the fundus of the gallbladder and  retracted it cephalad. We identified the infundibulum of the gallbladder and  retracted it laterally. Adhesions were taken down and we dissected out the  cystic duct and the cystic artery. We isolated the cystic artery as it went  onto the gallbladder wall, secured it with multiple metal clips and divided  it. We then dissected out a very large window behind the cystic duct and the  anatomy was quite clear.  We made three separate cuts in the cystic duct in  an attempt to perform a cholangiogram. The two cuts closest to the  gallbladder revealed a lumen but when I would try to insert the catheter it  would not go very far and when I would clip it and try to irrigate it would  not flush.  I made a third cut in the cystic duct closer to the common duct  and found that the lumen was essentially fibrotically occluded.  I abandoned  the cholangiogram at this point, placed multiple clips on the cystic duct  stump and  divided it. The gallbladder was dissected from its bed with  electrocautery and removed through the umbilical port. The operative field  and the subphrenic space were copiously irrigated with saline. All the  saline irrigation was removed and at the end the irrigation fluid was  completely clear. There was no evidence of bleeding or bile leak whatsoever  at the completion of the case.  The trocars were removed under direct vision  and there was no bleeding from the trocar sites.  The pneumoperitoneum was  released. The fascia at the umbilicus was closed with #0 Vicryl sutures. The  skin was closed with subcuticular sutures of 4-0 Monocryl and Steri-Strips.  Clean bandages were placed and the patient taken to the recovery room in  stable condition. Estimated blood loss was about 10 mL, complications none.  Sponge, needle and instrument counts were correct.                                               Angelia Mould. Derrell Lolling, M.D.    HMI/MEDQ  D:  08/09/2004  T:  08/09/2004  Job:  [id number]   cc:   Chales Salmon. Abigail Miyamoto, M.D.  [address]  [place]  [place] [address]  Fax: [phone]   Graylin Shiver, M.D.  [phone] [address]  [address]  [place], [place] [address]  Fax: [phone]

## 2011-04-13 NOTE — Discharge Summary (Signed)
[name], [name]                           ACCOUNT NO.:  [id number]   MEDICAL RECORD NO.:  [id number]                   PATIENT TYPE:  INP   LOCATION:  5714                                 FACILITY:  [facility]   PHYSICIAN:  Graylin Shiver, M.D.                DATE OF BIRTH:  [birth year]   DATE OF ADMISSION:  03/27/2003  DATE OF DISCHARGE:  04/05/2003                                 DISCHARGE SUMMARY   REASON FOR ADMISSION:  This patient is a 55 year old Caucasian female who  was admitted to the hospital with persistent complaints of nausea and  vomiting.  The patient had been experiencing nausea and vomiting for several  months, and these symptoms have been worsening.  She was scheduled for an  outpatient evaluation; however, presented to the emergency room with nausea  and vomiting.   PHYSICAL EXAMINATION:  GENERAL:  She appeared agitated.  She was retching  and vomiting clear gastric juice.  HEART:  Revealed tachycardia.  LUNGS:  Clear.  ABDOMEN:  Bowel sounds were normal.  It was not distended.  There was no  hepatosplenomegaly.   LABORATORY DATA:  Labs on admission, WBC was 11,900, H&H 14.3 and 42.8.  Sodium 140, potassium 3.7, chloride 108, CO2 of 25.  Liver enzymes as well  as lipase were normal.  Urinalysis, appearance cloudy, glucose negative,  bilirubin small, leukocyte esterase trace, bacteria few.  ANA negative.   Patient also had a CT of the head because of nausea and vomiting to look for  any intracranial abnormalities; this was negative.   Of note is that prior to hospitalization, the patient did have a gallbladder  ultrasound, which showed no evidence of gallstones and no dilated bile  ducts.  She also had a biliary scan with a normal ejection fraction.  She  also had an unremarkable CT of the abdomen and pelvis prior to admission.   HOSPITAL COURSE:  The patient was admitted to the hospital, given IV fluids.  She underwent further diagnostic testing, which  included an EGD, which was  normal.  A nuclear medicine gastric emptying study was done, which was  markedly abnormal, showing marked delay in gastric emptying.  It revealed  that at two hours, 93% of the count remained within the stomach.  A small-  bowel follow through was subsequently done, which revealed delay in gastric  emptying but rapid small-bowel transit without focal small-bowel mass.  The  patient was treated with intravenous hydration, intravenous Reglan,  Protonix, and Zofran.  She was also periodically given Phenergan.  Her  symptoms gradually improved; however, she did continue to complain of  morning nausea.  She was switched to oral medications, and since she was not  vomiting any longer, it was felt that she could be discharged and followed  as an outpatient.  She was discharged to home on Apr 05, 2003.   FINAL  DIAGNOSIS:  Gastroparesis, etiology unknown.   DISCHARGE MEDICATIONS:  1. Reglan 10 mg p.o. one-half hour before meals and at bedtime.  2. Phenergan suppositories 25 mg b.i.d.  3. She was to resume her previous home medications.    DIET:  Low-residue, low-fat.   FOLLOW UP:  Patient instructed to follow up with Dr. Evette Cristal in one week post  discharge.                                               Graylin Shiver, M.D.    [name]  D:  04/20/2003  T:  04/20/2003  Job:  [id number]

## 2011-04-13 NOTE — H&P (Signed)
NAMEDENETTA, [name]                           ACCOUNT NO.:  [id number]   MEDICAL RECORD NO.:  [id number]                   PATIENT TYPE:  EMS   LOCATION:  MAJO                                 FACILITY:  [facility]   PHYSICIAN:  James L. Malon Kindle., M.D.          DATE OF BIRTH:  Oct 25, 1956   DATE OF ADMISSION:  05/02/2003  DATE OF DISCHARGE:                                HISTORY & PHYSICAL   REASON FOR ADMISSION:  Nausea and vomiting.   HISTORY:  A 55 year old woman with documented idiopathic gastroparesis with  several previous admissions.  She has been extensively evaluated, and no  cause of her gastroparesis has been determined.  She was subsequently  referred to Dr. Alycia Rossetti in [facility] for further evaluation; she is to see  him again around the first week in July.  She has had three days of feeing  queasy, has cut back on what she is eating, has only been taking liquids and  crackers, and, for the past 24 hours, has had severe nausea and vomiting,  has basically thrown up everything, has been unable to keep down liquids or  any medications.  She is not really having much in the way of abdominal pain  except for cramping pain after the vomiting.  She has had no fever or chills  and no dysuria.   CURRENT MEDICATIONS:  1. Zelnorm 6 mg b.i.d.  2. Reglan 10 mg a.c. and h.s.  3. Topamax 100 mg q.h.s.  4. Effexor 75 mg one t.i.d.  5. Ambien 10 mg q.h.s.  6. Phenergan p.r.n.  7. Protonix 40 mg daily.   ALLERGIES:  She is allergic to ERYTHROMYCIN.   PAST MEDICAL HISTORY:  1. Chronic idiopathic gastroparesis of undetermined etiology.  2. History of chronic migraines and depression.  3. History of fibromyalgia.  4. History of knee surgery.   FAMILY HISTORY:  Parents are still living and have hypertension; no chronic  medical problems.  There is colon cancer in aunts and leukemia in the  family.   SOCIAL HISTORY:  She is married, works as an Designer, television/film set at [employer], does not  smoke or drink.   REVIEW OF SYSTEMS:  Remarkable for the fact that she does avoid eating and  tries to eat only bland food and crackers and chronically feels nauseated.  She has lost approximately 10 pounds since she became sick.  She has had no  real change in bowel movements.  She feels weak and fatigued and chronic  fatigue-type symptoms due to her fibromyalgia.   PHYSICAL EXAMINATION:  VITAL SIGNS:  Patient's temperature was not taken;  she was retching so much that they were unable to obtain an oral  temperature.  Pulse 117 and blood pressure 137/87.  GENERAL:  An alert white female with dry heaves; is not really vomiting up  anything but retching and heaving into an emesis basin.  EYES:  Sclerae are nonicteric.  Extraocular movements are intact.  THROAT:  Mucous membranes are dry.  NECK:  Supple.  LUNGS:  Clear anteriorly and posteriorly.  HEART:  Regular rate and rhythm without murmurs or gallops.  ABDOMEN:  Soft and nontender with normal bowel sounds.  No succussion splash  is heard.  EXTREMITIES:  Without edema.  Good peripheral pulses.   LABORATORY DATA:  Pertinent labs, all pending at this time.   ASSESSMENT:  1. Severe nausea and vomiting with impending dehydration.  2. Chronic idiopathic gastroparesis.   PLAN:  We will admit for IV fluids.  We will try to treat with IV Zofran,  Reglan, etc.  Hopefully, we will be able to turn this around in a couple of  days and allow her follow back up with Dr. [place] Gust over at [facility]. Malon Kindle., M.D.    [name]  D:  05/02/2003  T:  05/02/2003  Job:  [id number]   cc:   Chales Salmon. Abigail Miyamoto, M.D.  [address]  [place]  [place] [address]  Fax: [phone]   Graylin Shiver, M.D.  [phone]  [address]  [place], [place] [address]  Fax: [phone]   Dr. Beverly Gust  Division of Gastroenterology  [facility]  [place], [place]

## 2011-04-13 NOTE — Discharge Summary (Signed)
[name], [name]                           ACCOUNT NO.:  [id number]   MEDICAL RECORD NO.:  [id number]                   PATIENT TYPE:  INP   LOCATION:  5036                                 FACILITY:  [facility]   PHYSICIAN:  Graylin Shiver, M.D.                DATE OF BIRTH:  1956-02-21   DATE OF ADMISSION:  12/21/2003  DATE OF DISCHARGE:  12/24/2003                                 DISCHARGE SUMMARY   REASON FOR ADMISSION:  The patient is a 56 year old female with a history of  gastroparesis.  She was admitted to the hospital at this time with retching  and vomiting.  She presented to the emergency room with a 36-hour problem of  persistent dry heaves and vomiting, stating that she could not keep anything  down.   For physical exam, see H&P.   HOSPITAL COURSE:  The patient was treated with intravenous fluids and  antiemetics.  After a few days her clinical symptoms improved and she was  discharged home.   FINAL DIAGNOSIS:  Gastroparesis.   PLAN:  Resume home medications.  Diet as tolerated.  Follow-up as needed.                                                Graylin Shiver, M.D.    [name]  D:  05/16/2004  T:  05/18/2004  Job:  [id number]

## 2011-04-13 NOTE — Discharge Summary (Signed)
[name], [name]                           ACCOUNT NO.:  [id number]   MEDICAL RECORD NO.:  [id number]                   PATIENT TYPE:  OUT   LOCATION:  NUC                                  FACILITY:  [facility]   PHYSICIAN:  Graylin Shiver, M.D.                DATE OF BIRTH:  [birth year]   DATE OF ADMISSION:  DATE OF DISCHARGE:  04/12/2003                                 DISCHARGE SUMMARY   HISTORY OF PRESENT ILLNESS:  The patient is a 55 year old female with  complaints of several months of nausea, vomiting and dry heaves.  She  recently underwent a fairly extensive evaluation and was found to have  gastroparesis with delayed gastric emptying.  Her nuclear medicine gastric  emptying study showed 93% retained food after two hours.  An EGD was normal.  Small bowel follow through did not reveal any abnormalities in the small  bowel.  CT of the head, abdomen and pelvis, as well as a gallbladder  ultrasound, were negative.  She had no stones.  She also had a normal HIDA  scan with ejection traction.  The etiology of her persistent nausea,  vomiting and dry heaves is felt to be secondary to an idiopathic  gastroparesis.  She was sent home recently from the hospital on outpatient  therapy with Reglan 10 mg p.o. q.i.d., Phenergan suppositories, a low-fat  diet, but continued to experience nausea, vomiting and dry heaves.  Because  of persistent symptoms, her Reglan was increased as an outpatient, but she  continued to have problems, and she was readmitted to the hospital for  hydration and intravenous medications.   PHYSICAL EXAMINATION:  GENERAL APPEARANCE:  On admission, she was in no  distress.  LUNGS:  Clear.  HEART:  Regular rate and rhythm.  ABDOMEN:  Soft, nontender.   LABORATORY DATA:  On admission, WBC 8700, H&H 13.9 and 41.7, Sed rate  normal.  Sodium 137, potassium 3.5, chloride 108, CO2 21, liver enzymes and  amylase normal.  ANA negative.   HOSPITAL COURSE:  The patient  was again feeding with intravenous fluids and  IV Reglan.  She was also given Phenergan for her nausea.  She continued to  have problems with the nausea and vomiting as well as dry heaves.  I had  originally planned to refer her to [facility] to see Dr. Alycia Rossetti from the  Gastroenterology Department for further evaluation of her gastroparesis.  It  was decided to discharge and transfer the patient to [facility].  This was  decided after further discussion with the patient.  I spoke to Dr. Achilles Dunk at [facility] who accepted her in transfer.  The patient was stable  and it was felt she could be transported there by her husband for further  evaluation.   FINAL DIAGNOSIS:  Gastroparesis, etiology unclear.   PLAN:  Transfer to [facility] for further evaluation.  Graylin Shiver, M.D.    [name]  D:  04/20/2003  T:  04/20/2003  Job:  [id number]

## 2011-04-13 NOTE — Discharge Summary (Signed)
[name], [name]                           ACCOUNT NO.:  [id number]   MEDICAL RECORD NO.:  [id number]                   PATIENT TYPE:  INP   LOCATION:  5020                                 FACILITY:  [facility]   PHYSICIAN:  Graylin Shiver, M.D.                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  She basically had been vomiting  everything and could not keep liquids or medications down.   MEDICATIONS PRIOR TO ADMISSION:  1. Zelnorm 6 mg b.i.d.  2. Reglan 10 mg before meals and at bedtime.  3. Topamax 100 mg q.h.s.  4. Effexor 75 mg three pills daily.  5. Ambien 10 mg q.h.s.  6. Phenergan p.r.n.  7. Protonix.   ALLERGIES:  ERYTHROMYCIN.   PAST MEDICAL HISTORY:  The patient also carries a history of chronic  migraine headaches, depression, and fibromyalgia.   PHYSICAL EXAMINATION:  HEENT:  Eyes:  Sclera are nonicteric.  Throat:  Mucous membranes dry.  NECK:  Supple.  LUNGS:  Clear.  CARDIOVASCULAR:  Regular rhythm with no murmurs.  ABDOMEN:  Bowel sounds normal, abdomen soft and nontender.   LABORATORY DATA:  White blood cell count 9000, hemoglobin 14, hematocrit  41.4.  Sodium 138, potassium 3.4, chloride 109, CO2 22, glucose 149 which  came down the following day to 108.   HOSPITAL COURSE:  The patient was admitted to the hospital, given  intravenous hydration with potassium supplementation. She was started on IV  Reglan and Phenergan.  Zofran was also used as an antiemetic.  Zelnorm 6 mg  b.i.d. was given.  The patient continued to experience nausea and dry  heaves.  She was first tried on clear liquids.  Her case was discussed with  Dr. Alycia Rossetti of [facility]  who is an expert in the field of  gastric motility disorders and who read her recent electrogastrogram.  He  suggested stopping the Reglan and trying Bentyl suspension 10 mg q.i.d.  along with Zelnorm feeling that the Bentyl suspension could help to relax  the smooth muscle of the stomach to allow accommodation of food and liquid.  This was tried. The patient remained nauseated but on May 06, 2003, felt  better.   DISPOSITION:  She was subsequently discharged to home on May 06, 2003.   DISCHARGE MEDICATIONS:  She was given instructions to resume her home  medications and to take Zelnorm 6 mg b.i.d. and Bentyl suspension 10 mg  q.i.d. a half hour before meals and at bedtime.   FOLLOW UP:  She was instructed to follow up with Dr. Evette Cristal in two  weeks and  Dr. Alycia Rossetti at [facility] whom she has an appointment with.   DIET:  She was also instructed on resuming the diet that Dr. Alycia Rossetti had given  her before.   ACTIVITY:  Activity as tolerated.                                               Graylin Shiver, M.D.    [name]  D:  05/18/2003  T:  05/19/2003  Job:  [id number]   cc:   Beverly Gust, M.D.  [facility]  GI Dept.   [name]. [name], M.D.  [address]  [place]  [place] [address]  Fax: [phone]    cc:   Beverly Gust, M.D.  [facility]  GI Dept.   [name]. [name], M.D.  [address]  [place]  [place] [address]  Fax: [phone]

## 2011-04-13 NOTE — Discharge Summary (Signed)
   NAMELULUBELLE, SIMCOE                           ACCOUNT NO.:  [id number]   MEDICAL RECORD NO.:  [id number]                   PATIENT TYPE:  INP   LOCATION:  5010                                 FACILITY:  [facility]   PHYSICIAN:  Graylin Shiver, M.D.                DATE OF BIRTH:  11-May-1956   DATE OF ADMISSION:  07/03/2003  DATE OF DISCHARGE:  07/06/2003                                 DISCHARGE SUMMARY   HISTORY:  The patient is a 55 year old female   Dictation inaudible from this point on                                                Graylin Shiver, M.D.    [name]  D:  08/10/2003  T:  08/11/2003  Job:  [id number]

## 2011-04-13 NOTE — H&P (Signed)
[name], [name]                           ACCOUNT NO.:  [id number]   MEDICAL RECORD NO.:  [id number]                   PATIENT TYPE:  INP   LOCATION:  5710                                 FACILITY:  [facility]   PHYSICIAN:  Graylin Shiver, M.D.                DATE OF BIRTH:  1956/07/12   DATE OF ADMISSION:  05/27/2003  DATE OF DISCHARGE:                                HISTORY & PHYSICAL   CHIEF COMPLAINT:  Persistent nausea, vomiting and dry heaves.   HISTORY OF PRESENT ILLNESS:  This patient is a 55 year old Caucasian female  who is being readmitted to [facility] with persistent complaints of  nausea, vomiting and dry heaves.  The patient has diagnoses of gastroparesis  and tachygastria.  She was recently in this hospital on several occasions  and has been at [facility] also.  She was discharged  from [facility] one week ago after being transferred  there from here to see Dr. Alycia Rossetti, a gastroenterologist who has expertise in  severe gastric motility disorders.  The patient states that after going home  she did well for two days, but then started having problems with nausea,  vomiting, dry heaves again.  This has progressed over the last several days,  has gotten to a point where she was unable to keep any liquids down or her  medications down.  She called the office today and stated she was extremely  ill and it was felt prudent to readmit her to the hospital.   The patient has had extensive evaluation in the past to try to find a reason  for her problem and the findings were that of gastroparesis and  tachygastria.   PAST MEDICAL HISTORY:  1. Chronic migraine headaches and depression.  2. Fibromyalgia.   PRIOR SURGERIES:  Knee surgery.   ALLERGIES:  ERYTHROMYCIN.   MEDICATIONS PRIOR TO ADMISSION:  1. Zelnorm 6 mg p.o. t.i.d.  2. Topamax 100 mg q.h.s.  3. Effexor 75 mg three pills daily.  4. Ambien 2.5 mg at bedtime.  This  dose was recently decreased when she was     at [facility].  5. Phenergan suppositories p.r.n.  6. Protonix 40 mg b.i.d.  7. Bentyl suspension 20 mg q.i.d.  The patient states that the Bentyl     suspension she feels has been making her somewhat nauseated.   FAMILY HISTORY:  Mother with hypertension.  An aunt had colon cancer.  There  is leukemia in the family.   SOCIAL HISTORY:  She does not smoke.  She does not drink alcohol.   REVIEW OF SYSTEMS:  GENERAL:  In general, her symptoms have been as above.  She denied fever or chills.  SKIN:  Denied any jaundice.  CARDIOVASCULAR:  She denied any anginal chest pain, shortness of breath or palpitations.  RESPIRATORY:  Denied cough, sputum production, hemoptysis.  GI:  As above.  No complaints of abdominal pain; no heartburn; no hematemesis, melena,  hematochezia or constipation.  URINARY:  No dysuria.  NEUROLOGIC:  No  seizures.   PHYSICAL EXAMINATION:  GENERAL:  She is lying in her bed.  Family is present  in the room.  She does not appear in acute distress at this time, but in  speaking to the nurse, she was heaving prior to my arrival.  SKIN:  Nonicteric.  EYES:  Extraocular movements normal.  Sclerae nonicteric.  MOUTH AND THROAT:  Mucous membranes are mildly dry.  NECK:  Neck supple.  No masses, adenopathy or goiter.  HEART:  Regular rhythm with no murmurs, gallops or rubs.  LUNGS:  Lungs clear.  ABDOMEN:  Bowel sounds are present and soft, nontender.  No  hepatosplenomegaly.  EXTREMITIES:  No edema.  DTRs 2+ bilateral.  NEUROLOGIC:  Mentation:  Oriented x3.   IMPRESSION:  [age]-year-old Caucasian female with gastroparesis and  tachygastria, readmitted to the hospital because of persistent nausea and  vomiting over the last several days.   PLAN:  IV hydration.  Attempts to control her nausea and vomiting will be  done with Phenergan.  The patient will also be placed on Protonix IV.  I  will switch her Bentyl to Levsin SL,  since she stated that the Bentyl was  causing her to have some nausea.  She will be given Ativan for agitation.  Her home medications will be resumed as well.   LABORATORY DATA:  Admitting labs reveal a white blood cell count of 10,900,  H&H 12.8/38; sodium 142, potassium 3, chloride 110, CO2 25, BUN 4,  creatinine 0.9; LFTs normal.                                                Graylin Shiver, M.D.    SFG/MEDQ  D:  05/27/2003  T:  05/28/2003  Job:  [id number]

## 2011-04-13 NOTE — Op Note (Signed)
[name], [name]                 ACCOUNT NO.:  [id number]   MEDICAL RECORD NO.:  [id number]          PATIENT TYPE:  AMB   LOCATION:  ENDO                         FACILITY:  [facility]   PHYSICIAN:  Anselmo Rod, M.D.  DATE OF BIRTH:  06/11/1956   DATE OF PROCEDURE:  04/14/2005  DATE OF DISCHARGE:                                 OPERATIVE REPORT   PROCEDURE:  Screening colonoscopy.   ENDOSCOPIST:  Anselmo Rod, M.D.   INSTRUMENT USED:  Olympus video colonoscope.   INDICATIONS FOR PROCEDURE:  This 55 year old white female with a personal  history of rectal bleeding and a family history of colon cancer in a  maternal aunt,  underwent a screening colonoscopy to rule out colonic  polyps, masses, etc.   PRE-PROCEDURE PREPARATION:  An informed consent was procured from the  patient.  The patient was fasted for eight hours prior to the procedure and  prepped with a bottle of magnesium citrate and a gallon of GoLYTELY on the  night prior to the procedure.  The risks and benefits of the procedure,  including a 10% mis-rate of cancer and polyps was discussed with her as  well.   PRE-PROCEDURE PHYSICAL EXAMINATION:  VITAL SIGNS:  Stable.  NECK:  Supple.  CHEST:  Clear to auscultation.  HEART:  S1, S2 regular.  ABDOMEN:  Soft with normal bowel sounds.   DESCRIPTION OF PROCEDURE:  The patient was placed in the left lateral  decubitus position and sedated with 60 mg of Demerol and 6 mg of Versed in  slow incremental doses.  Once the patient was adequately sedated and  maintained on low-flow oxygen and continuous cardiac monitoring, the Olympus  video colonoscope was advanced from the rectum to the cecum.  The  appendicular orifice and the ileocecal valve were clearly visualized and  photographed.  The patient had a fairly good prep.  Small internal  hemorrhoids were seen on retroflexion, which I suspected the source of the  patient's rectal bleeding.  The rest of the exam to the  terminal ileum was  normal.  No masses, polyps, erosions, ulcerations or diverticula were seen.   IMPRESSION:  Normal colonoscopy to the terminal ileum except for small  internal hemorrhoids.  No masses, polyps or diverticula seen.   RECOMMENDATIONS:  1. Continue a high-fiber diet with liberal fluid intake.  2. Considering her family history of colon cancer, repeat colonoscopy is      advised in the next five years, unless the patient develops any      abnormal symptoms in the interim.  3. Outpatient followup as the need arises in the future.        JNM/MEDQ  D:  04/16/2005  T:  04/16/2005  Job:  [id number]   cc:   Chales Salmon. Abigail Miyamoto, M.D.  [address]  [place]  [place] [address]  Fax: [phone]

## 2011-04-13 NOTE — H&P (Signed)
[name], [name]                           ACCOUNT NO.:  [id number]   MEDICAL RECORD NO.:  [id number]                   PATIENT TYPE:  INP   LOCATION:  3018                                 FACILITY:  [facility]   PHYSICIAN:  Deirdre Peer. Polite, M.D.              DATE OF BIRTH:  03/24/1956   DATE OF ADMISSION:  01/10/2004  DATE OF DISCHARGE:                                HISTORY & PHYSICAL   CHIEF COMPLAINT:  Nausea and vomiting.   HISTORY OF PRESENT ILLNESS:  The patient is a 55 year old female with a  known history of gastroparesis, migraine headaches and fibromyalgia, who  presents to the hospital as a direct admission, after a discussion with Dr.  Graylin Shiver. The patient states that she had nausea, vomiting and dry  heaves for two days, and is unable to keep anything down.  She denies any  fever or chills.  No diarrhea.  Positive for constipation, but states that  she has had a bowel movement today.  No abdominal pain.  Denies any change  in medications.  Of note, the patient has had multiple admissions in the  past for the same, which generally is responsive to IV fluids and  antiemetics.  Because of the patient's protracted illness, the [facility] has been called to evaluate the patient for further treatment.   PAST MEDICAL HISTORY:  1. As stated above, documented gastroparesis.  2. Migraine headaches.  3. Fibromyalgia.   MEDICATIONS:  1. Effexor 75 mg t.i.d.  2. Protonix 40 mg b.i.d.  3. Atenolol 50 mg daily.  4. Nulev 0.125 mg t.i.d.  5. Zelnorm 6 mg t.i.d. p.r.n.  6. Phenergan.   SOCIAL HISTORY:  Negative for tobacco and alcohol and drugs.   PAST SURGICAL HISTORY:  Significant for right knee arthroscopy approximately  six years ago.  The patient denies an appendectomy or cholecystectomy.   ALLERGIES:  The patient describes an allergy to ERYTHROMYCIN.   FAMILY HISTORY:  Her mother and father both have problems with hypertension  and heart problems.   REVIEW OF SYSTEMS:  As stated in the HPI.   PHYSICAL EXAMINATION:  VITAL SIGNS:  Temperature 97.9 degrees, pulse 60,  blood pressure 128/62, saturation 93% on room air.  GENERAL:  The patient is alert and oriented x3, with occasional retching.  HEENT:  Within normal limits.  CHEST:  Clear.  CARDIOVASCULAR:  Regular S1, S2.  No S3.  ABDOMEN:  Soft, with positive bowel sounds.  No hepatosplenomegaly.  No  guarding, no rebound tenderness.  EXTREMITIES:  No clubbing, cyanosis, or edema.  NEUROLOGIC:  Nonfocal.   LABORATORY DATA:  CBC:  White count 12.2, hemoglobin 13, platelets 465.  BMET within normal limits.  AST and ALT within normal limits.   ASSESSMENT/PLAN:  1. Nausea and vomiting x2 days, most likely represents the patient's     baseline gastroparesis.  2. Migraine headaches.  3. Fibromyalgia.   RECOMMENDATIONS:  Recommend that the patient be admitted to the medicine  floor and be given judicious IV fluids and antiemetics.  Recommend excluding  other possibilities, i.e. constipation versus urinary tract infection as a  cause for her symptoms.  Therefore will order an abdominal series and a  urinalysis for completeness.  The patient has expressed to me that she  prefers to see her gastroenterologist only.  I have made him aware of this,  and at this time will transfer the patient's service to Dr. Evette Cristal for  further medical treatment and followup.  In the interim, [facility]  will follow with the patient until Dr. Evette Cristal assumes care.                                                Deirdre Peer. Polite, M.D.    RDP/MEDQ  D:  01/10/2004  T:  01/10/2004  Job:  [id number]   cc:   Graylin Shiver, M.D.  [address]  [address]  [place], [place] [address]  Fax: [phone]

## 2011-04-13 NOTE — Discharge Summary (Signed)
[name], [name]                           ACCOUNT NO.:  [id number]   MEDICAL RECORD NO.:  [id number]                   PATIENT TYPE:  INP   LOCATION:  3018                                 FACILITY:  [facility]   PHYSICIAN:  Graylin Shiver, M.D.                DATE OF BIRTH:  03-09-1956   DATE OF ADMISSION:  01/10/2004  DATE OF DISCHARGE:  01/18/2004                                 DISCHARGE SUMMARY   REASON FOR ADMISSION:  The patient is a 55 year old female with a known  history of gastroparesis secondary to tachygastria.  She was admitted to the  hospital because of complaints of nausea, vomiting, and dry heaves.  This is  one of many admissions for this patient over the past year with the similar  problem.   For physical, see H&P.   HOSPITAL COURSE:  The patient was treated with intravenous fluids, given IV  hydration.  She was placed on a clear liquid diet.  She was given antiemetic  therapy with Phenergan and Zofran.  During her hospital course, she had some  problems with migraine headaches for which she was given Imitrex which  helped.  An abdominal series did not reveal any evidence of obstruction.  A  repeat nuclear medicine gastric emptying study showed 87% of radionuclide  remaining in the stomach at two hours.  An upper GI with small bowel follow-  through showed a small hiatal hernia with moderate gastroesophageal reflux,  small bowel transient was rapid.  She subsequently improved after a few days  and was discharged home on her home medications.   DISCHARGE MEDICATIONS:  1. Effexor 75 mg t.i.d.  2. Protonix 40 mg b.i.d.  3. Atenolol 50 mg q.d.  4. NuLev t.i.d.  5. Zelnorm 6 mg t.i.d.  6. Phenergan p.r.n.  7. Zofran p.r.n.  8. Compazine 1 q.d. p.r.n.   ACTIVITY:  As tolerated.   DIET:  As tolerated.   FOLLOW UP:  I was going to try to get this patient an appointment with me on  clinic because of her chronic ongoing problems.                       Graylin Shiver, M.D.    [name]  D:  02/17/2004  T:  02/17/2004  Job:  [id number]

## 2011-04-13 NOTE — H&P (Signed)
NAMEKOREEN, LIZAOLA                           ACCOUNT NO.:  [id number]   MEDICAL RECORD NO.:  [id number]                   PATIENT TYPE:  INP   LOCATION:  5022                                 FACILITY:  [facility]   PHYSICIAN:  Graylin Shiver, M.D.                DATE OF BIRTH:  [birth year]   DATE OF ADMISSION:  05/10/2003  DATE OF DISCHARGE:                                HISTORY & PHYSICAL   CHIEF COMPLAINT:  Persistent nausea, vomiting, and dry heaves.   HISTORY OF PRESENT ILLNESS:  This patient is a 55 year old Caucasian female  who was readmitted to the hospital with persistent complaints of nausea,  vomiting, and dry heaves.  She has a diagnosis of gastroparesis and  tachygastria.  She had an electrogastrogram at [facility] in the recent past which diagnosed with tachygastria.  She has been  readmitted to the hospital on multiple occasions over the last couple of  months due to persistent and recurrent nausea and vomiting.   She was discharged from the hospital five days ago and states that she did  fine for a day on her discharge medications plus a liquid-type of diet.  Two  days after discharge from the hospital, however, she started having problems  again.  She called the office today and then called again this evening with  persistent nausea and vomiting and was readmitted to the hospital.   The patient has had an extensive evaluation in the past including EGD,  gastric emptying study which was markedly abnormal, electrogastrogram at  [facility] which diagnosed tachygastria.  CT of the  abdomen which was unremarkable for a cause for this problem.  CT of the head  which did not reveal any type of tumor.   PAST MEDICAL HISTORY:  1. Chronic migraines and depression.  2. Fibromyalgia.   PAST SURGICAL HISTORY:  Knee surgery.   ALLERGIES:  ERYTHROMYCIN.   MEDICATIONS:  1. Zelnorm 6 mg b.i.d.  2. Topamax 100 mg q.h.s.  3. Effexor  75 mg 3 pills q.d.  4. Ambien 10 mg h.s.  5. Phenergan p.o. and suppositories p.r.n. nausea.  6. Protonix 40 mg q.d.  7. Bentyl suspension 10 mg p.o. q.i.d. with attempts at increasing this to     20 mg q.i.d. today but the patient vomited this.   FAMILY HISTORY:  Mother with hypertension.  Colon cancer in aunt.  Leukemia  in family as well.   SOCIAL HISTORY:  She does not smoke.  She does not drink alcohol.   REVIEW OF SYMPTOMS:  GENERAL:  She states she has not been feeling well for  the last few days.  Denies fever.  SKIN:  Denies jaundice.  CARDIOVASCULAR:  Denies angina, chest pain, palpitations, dyspnea on exertion, orthopnea, or  PND.  PULMONARY:  Denies shortness of breath, cough, sputum production, or  hemoptysis.  GASTROINTESTINAL:  No dysphagia or odynophagia.  No heartburn.  No abdominal pain.  No constipation or diarrhea.  Other gastrointestinal  history as above.  NEUROLOGICAL:  Chronic intermittent migraine headaches.  Denies strokes or seizures.   PHYSICAL EXAMINATION:  GENERAL:  She does not appear in any acute distress.  HEENT:  She is nonicteric.  Extraocular movements intact.  Mouth and throat  are slightly dry.  VITAL SIGNS:  Pulse 100.  NECK:  Supple.  No masses, lymphadenopathy, or bruit.  HEART:  Regular rhythm.  No murmurs, rubs, or gallops.  LUNGS:  Clear.  BACK:  No CVA tenderness.  ABDOMEN:  Bowel sounds are positive.  Soft and nontender.  No  hepatosplenomegaly.  EXTREMITIES:  No edema.  DTRs normal.   LABORATORY FINDINGS:  Initial labs show WBC 11,600.  H&H 14.4 and 43.3.  Sodium is 141, potassium 3.2, chloride 111, CO2 25.  Liver enzymes normal.   IMPRESSION:  Gastroparesis with diagnosis by electrogastrogram of  tachygastria.  This has resulted in recurrent nausea, vomiting, and dry  heaves, and the patient has been unable to keep food, liquid, or her  medications down at home.   PLAN:  She is being readmitted to the hospital for intravenous  hydration,  potassium supplementation.  I will also increase her Bentyl suspension to 20  mg p.o. q.i.d.  Zelnorm will be increased to 6 mg p.o. t.i.d.  Her home  medications will be resumed.  I will attempt to further discuss her case  with Dr. Alycia Rossetti at [facility].                                               Graylin Shiver, M.D.    SFG/MEDQ  D:  05/10/2003  T:  05/10/2003  Job:  [id number]   cc:   Graylin Shiver, M.D.  [address]  [address]  [place], [place] [address]  Fax: [phone]   Chales Salmon. Abigail Miyamoto, M.D.  [address]  [place]  [place] [address]  Fax: [phone]   Beverly Gust, M.D.  Division of Gastroenterology [facility]  [place], [place]    cc:   Graylin Shiver, M.D.  [address]  [address]  [place], [place] [address]  Fax: [phone]   Chales Salmon. Abigail Miyamoto, M.D.  [address]  [place]  [place] [address]  Fax: [phone]   Beverly Gust, M.D.  Division of Gastroenterology [facility]  [place], [place]

## 2011-04-13 NOTE — H&P (Signed)
[name], [name]                           ACCOUNT NO.:  [id number]   MEDICAL RECORD NO.:  [id number]                   PATIENT TYPE:  INP   LOCATION:  1825                                 FACILITY:  [facility]   PHYSICIAN:  James L. Malon Kindle., M.D.          DATE OF BIRTH:  May 06, 1956   DATE OF ADMISSION:  03/27/2003  DATE OF DISCHARGE:                                HISTORY & PHYSICAL   REASON FOR ADMISSION:  Nausea and vomiting and unable to keep down liquids  or medications.   HISTORY:  A 55 year old woman who has had a history of intermittent nausea  dating back to childhood with nothing very severe.  She has never had to be  admitted or given IV fluids.  Approximately one week ago, she became  progressively nauseated and had been unable to keep down liquids or  medicines.  She has been seen by several physicians as an outpatient and has  had workup.  This has included an ultrasound that showed no gallstones, no  dilated ducts, etc.  She had also had a biliary scan with a normal ejection  fraction.  The patient notes that she had had some lower abdominal pain back  in January that was felt to be a different issue.  She had a CT of the  abdomen and pelvis.  There were no signs of appendicitis or no other thing  seen.  Her pain resolved after a period and it was felt that this was  probably due to some sort of ovarian cyst.  The CT has not been repeated  since one was just done in January.  Following her period in January, this  pain resolved completely and has not recurred.  Currently, her symptoms are  primarily nausea and vomiting associated with headaches.  She does have  chronic migraines but they have been worse in the past week.  She has not  had any abdominal pain other than the immediate pain associated with the  retching.  She has had some loose stools but diarrhea has not really been a  part of this.  She tends to vomit up everything that she eats or drinks  including  pills and even vomits when she has not had anything to eat or  drink.  She has been to [facility] on at least one occasion and had  labs done that were normal, which I do not have currently available, and has  been there and received IV fluids.  She saw Dr. Evette Cristal in the office and a  hepatobiliary scan was ordered.  She is scheduled Monday for an endoscopy.   CURRENT MEDICATIONS:  1. Topamax.  2. Axert p.r.n. for migraines.  3. Effexor XR 175 mg daily.  4. Phenergan p.r.n.   ALLERGIES:  ERYTHROMYCIN.   PAST MEDICAL HISTORY:  1. She does have a history of chronic migraines, possibly some depression.  She takes p.r.n. medications for migraines.  2. She has had knee surgery but has never had any abdominal surgery.   FAMILY HISTORY:  Her parents are still living, have hypertension.  No other  chronic medical problems.  Two aunts died of cancer, one of colon cancer and  one of leukemia.   SOCIAL HISTORY:  She is married and does not smoke or drink.  Works as an  Designer, television/film set for [employer].   REVIEW OF SYSTEMS:  Remarkable for chronic migraines and problems with that.  She has had intermittent nausea associated with these and had bouts of  nausea as a child but they have never really been particularly severe.  Her  bowel movements are generally normal.   PHYSICAL EXAMINATION:  VITAL SIGNS:  Temperature 97.8, blood pressure  106/71, pulse 122, respirations 24.  GENERAL:  An anxious, somewhat agitated white female who is actively  retching and vomiting gastric juice into an emesis basin.  HEENT:  Eyes:  Sclera nonicteric.  Extraocular movements grossly intact.  Pupils equal and reactive to light.  NEUROLOGIC:  The patient moves all extremities, has equal strength  bilaterally.  No gross motor defects.  NECK:  Supple.  No lymphadenopathy.  HEART:  Tachycardia, otherwise normal.  LUNGS:  Clear.  ABDOMEN:  Soft, nondistended, with normal bowel sounds.  Completely  nontender.    PERTINENT LABORATORY DATA:  Urinalysis negative.  BMET and liver function  tests all within normal range.  Lipase is normal.   ASSESSMENT:  1. Severe nausea and vomiting--this could be due to a number of things.  She     is not having any abdominal pain and I am not really sure if the     gallbladder is part of the problem or not.  She does have fairly severe     migraines and it may be that this is some variant of cyclic vomiting     syndrome or a migraine equivalent.  She is having worsening headaches and     I think we need to explore this further.  She clearly is unable to keep     down liquids.  She is tachycardic, indicating pending dehydration.     Again, has already required intravenous fluids in her primary care     physician's office and for this reason I feel she needs inpatient therapy     for control of her symptoms, intravenous fluids, and further workup.  2. Chronic migraines.   PLAN:  We will admit.  Give intravenous fluids.  Control her nausea with  Phenergan and Zofran.  Obtain a CT of her head to evaluate her worsening  headache.  She will have an endoscopy Monday as planned.                                               James L. Malon Kindle., M.D.    [name]  D:  03/27/2003  T:  03/27/2003  Job:  [id number]   cc:   Graylin Shiver, M.D.  [address]  [address]  [place], [place] [address]  Fax: [phone]   Chales Salmon. Abigail Miyamoto, M.D.  [address]  [place]  [place] [address]  Fax: [phone]

## 2011-04-16 ENCOUNTER — Encounter: Payer: Self-pay | Admitting: Medical

## 2011-04-16 ENCOUNTER — Ambulatory Visit (INDEPENDENT_AMBULATORY_CARE_PROVIDER_SITE_OTHER): Payer: 59 | Admitting: Medical

## 2011-04-16 VITALS — BP 120/72 | HR 112 | Temp 97.4°F | Ht 63.0 in | Wt 208.0 lb

## 2011-04-16 DIAGNOSIS — J189 Pneumonia, unspecified organism: Secondary | ICD-10-CM

## 2011-04-16 DIAGNOSIS — K649 Unspecified hemorrhoids: Secondary | ICD-10-CM

## 2011-04-16 DIAGNOSIS — R0602 Shortness of breath: Secondary | ICD-10-CM

## 2011-04-16 DIAGNOSIS — R42 Dizziness and giddiness: Secondary | ICD-10-CM

## 2011-04-16 MED ORDER — PRAMOXINE HCL 1 % RE FOAM: RECTAL | Status: AC | PRN

## 2011-04-16 MED ORDER — LEVOFLOXACIN 500 MG PO TABS: 500.0000 mg | ORAL_TABLET | Freq: Every day | ORAL | Status: AC

## 2011-04-16 NOTE — Progress Notes (Signed)
Subjective:   HPI Here for recheck on pneumonia.  She saw me on 04/09/2011 for recheck on pneumonia after having seen urgent care and diagnosed with pneumonia. She was already on doxycycline at that time, and I started her on prednisone taper and albuterol nebs at the 5/14 visit.  She notes some dry mouth and been dizzy over the last 2 days.  Her dizziness lasts for seconds, worse when she gets up from seated position. She has finished the Doxycycline and prednisone.  She is still using the nebulizer machine every 6 hours.  She notes that she was improving until Saturday night, but was having some shortness of breath just walking through the house over the weekend.  Main c/o continues to be SOB, fatigue, cough, not a whole lot better overall.  She denies hx/o recent travel, calve pain, surgery, HRT, no hx/o blood clots.  She also notes some hemorrhoid flareup, and over-the-counter treatments are not helping.   Review of Systems Constitutional: +fatigue; denies fever, chills, sweats, unexpected weight change, anorexia Dermatology: denies rash ENT: no runny nose, ear pain, sore throat, hoarseness, sinus pain, teeth pain, tinnitus, hearing loss, epistaxis Cardiology:  +palpitations, denies chest pain, edema, orthopnea, paroxysmal nocturnal dyspnea Respiratory: +cough, no production, +shortness of breath, dyspnea on exertion, wheezing; no hemoptysis Gastroenterology: +diarrhea, hemorrhoids, nausea.  Denies abdominal pain, vomiting, constipation, blood in stool Musculoskeletal: +chest aches; denies arthralgias, myalgias, joint swelling, back pain Urology: +frequency; denies dysuria, difficulty urinating, hematuria, urinary frequency, urgency, incontinence Neurology: no numbness or tingling    Objective:   Physical Exam  General appearance: alert, obese white female HEENT: normocephalic, conjunctiva/corneas normal, sclerae anicteric, PERRLA, EOMi, nares patent, no discharge or erythema, pharynx  normal Oral cavity: somewhat dry mucous membranes Neck: supple, no lymphadenopathy, no thyromegaly,no JVD Heart: tachycardic around 110 bpm, otherwise RRR, normal S1, S2, no murmurs Lungs: decreased breath sounds, no wheezes, rhonchi, or rales Extremities: no edema, no cyanosis, no clubbing, no calve tenderness Pulses: 2+ symmetric, upper and lower extremities, normal cap refill Neurological: alert, oriented x 3, CN2-12 intact  CXR: right lung field/possible middle lobe infiltrate, otherwise no acute process or other focal finding, effort somewhat poor.  Cardiac silhouette normal.  No effusion.  Assessment & Plan:    Encounter Diagnoses  Name Primary?  . Pneumonia Yes  . Shortness of breath   . Hemorrhoids   . Dizziness     Pneumonia-not improving all that much, but not worse either. Advise that fatigue, cough, shortness of breath are usually pneumonia. Given x-ray findings today, we will use another course of antibiotics with Levaquin.  Continue to hydrate well and the rest.  Shortness of breath-continue albuterol nebulized as needed, and we will get a CBC and d-dimer today. Discussed that d-dimer is positive we will need a chest CT.  Hemorrhoids-Proctofoam sent  Dizziness-advise she get up slowly from a seated position, drink clear fluids, rest.  Discussed case with Dr. Lynelle Doctor today.

## 2011-04-16 NOTE — Patient Instructions (Signed)
We will call with lab results   

## 2011-04-17 ENCOUNTER — Telehealth: Payer: Self-pay | Admitting: Medical

## 2011-04-17 ENCOUNTER — Other Ambulatory Visit: Payer: Self-pay | Admitting: Medical

## 2011-04-17 ENCOUNTER — Emergency Department (HOSPITAL_COMMUNITY): Payer: 59

## 2011-04-17 ENCOUNTER — Encounter: Payer: Self-pay | Admitting: Medical

## 2011-04-17 ENCOUNTER — Emergency Department (HOSPITAL_COMMUNITY)
Admission: EM | Admit: 2011-04-17 | Discharge: 2011-04-17 | Disposition: A | Discharge status: Home / Self Care | Payer: 59 | Attending: Emergency Medicine | Admitting: Emergency Medicine

## 2011-04-17 DIAGNOSIS — K219 Gastro-esophageal reflux disease without esophagitis: Secondary | ICD-10-CM | POA: Insufficient documentation

## 2011-04-17 DIAGNOSIS — R42 Dizziness and giddiness: Secondary | ICD-10-CM | POA: Insufficient documentation

## 2011-04-17 DIAGNOSIS — J90 Pleural effusion, not elsewhere classified: Secondary | ICD-10-CM | POA: Insufficient documentation

## 2011-04-17 DIAGNOSIS — G4730 Sleep apnea, unspecified: Secondary | ICD-10-CM | POA: Insufficient documentation

## 2011-04-17 DIAGNOSIS — J189 Pneumonia, unspecified organism: Secondary | ICD-10-CM

## 2011-04-17 DIAGNOSIS — I319 Disease of pericardium, unspecified: Secondary | ICD-10-CM | POA: Insufficient documentation

## 2011-04-17 DIAGNOSIS — E7800 Pure hypercholesterolemia, unspecified: Secondary | ICD-10-CM | POA: Insufficient documentation

## 2011-04-17 DIAGNOSIS — R0789 Other chest pain: Secondary | ICD-10-CM | POA: Insufficient documentation

## 2011-04-17 DIAGNOSIS — R0989 Other specified symptoms and signs involving the circulatory and respiratory systems: Secondary | ICD-10-CM | POA: Insufficient documentation

## 2011-04-17 DIAGNOSIS — R05 Cough: Secondary | ICD-10-CM | POA: Insufficient documentation

## 2011-04-17 DIAGNOSIS — R0609 Other forms of dyspnea: Secondary | ICD-10-CM | POA: Insufficient documentation

## 2011-04-17 DIAGNOSIS — R0602 Shortness of breath: Secondary | ICD-10-CM

## 2011-04-17 DIAGNOSIS — D649 Anemia, unspecified: Secondary | ICD-10-CM | POA: Insufficient documentation

## 2011-04-17 DIAGNOSIS — I498 Other specified cardiac arrhythmias: Secondary | ICD-10-CM | POA: Insufficient documentation

## 2011-04-17 DIAGNOSIS — R059 Cough, unspecified: Secondary | ICD-10-CM | POA: Insufficient documentation

## 2011-04-17 LAB — DIFFERENTIAL
Basophils Absolute: 0 10*3/uL (ref 0.0–0.1)
Basophils Relative: 0 % (ref 0–1)
Eosinophils Absolute: 0.2 10*3/uL (ref 0.0–0.7)
Eosinophils Relative: 1 % (ref 0–5)
Lymphocytes Relative: 19 % (ref 12–46)
Lymphs Abs: 3.1 10*3/uL (ref 0.7–4.0)
Monocytes Absolute: 1 10*3/uL (ref 0.1–1.0)
Monocytes Relative: 6 % (ref 3–12)
Neutro Abs: 12 10*3/uL — ABNORMAL HIGH (ref 1.7–7.7)
Neutrophils Relative %: 74 % (ref 43–77)

## 2011-04-17 LAB — POCT I-STAT, CHEM 8
BUN: 24 mg/dL — ABNORMAL HIGH (ref 6–23)
Calcium, Ion: 1.05 mmol/L — ABNORMAL LOW (ref 1.12–1.32)
Chloride: 106 mEq/L (ref 96–112)
Creatinine, Ser: 1.4 mg/dL — ABNORMAL HIGH (ref 0.4–1.2)
Glucose, Bld: 95 mg/dL (ref 70–99)
HCT: 27 % — ABNORMAL LOW (ref 36.0–46.0)
Hemoglobin: 9.2 g/dL — ABNORMAL LOW (ref 12.0–15.0)
Potassium: 3.4 mEq/L — ABNORMAL LOW (ref 3.5–5.1)
Sodium: 139 mEq/L (ref 135–145)
TCO2: 23 mmol/L (ref 0–100)

## 2011-04-17 LAB — URINALYSIS, ROUTINE W REFLEX MICROSCOPIC
Bilirubin Urine: NEGATIVE
Glucose, UA: NEGATIVE mg/dL
Hgb urine dipstick: NEGATIVE
Ketones, ur: NEGATIVE mg/dL
Nitrite: NEGATIVE
Protein, ur: NEGATIVE mg/dL
Specific Gravity, Urine: >1.03 — ABNORMAL HIGH (ref 1.005–1.030)
Urobilinogen, UA: 0.2 mg/dL (ref 0.0–1.0)
pH: 5.5 (ref 5.0–8.0)

## 2011-04-17 LAB — COMPREHENSIVE METABOLIC PANEL
ALT: 17 U/L (ref 0–35)
AST: 19 U/L (ref 0–37)
Albumin: 2.8 g/dL — ABNORMAL LOW (ref 3.5–5.2)
Alkaline Phosphatase: 83 U/L (ref 39–117)
BUN: 25 mg/dL — ABNORMAL HIGH (ref 6–23)
CO2: 25 mEq/L (ref 19–32)
Calcium: 8.9 mg/dL (ref 8.4–10.5)
Chloride: 102 mEq/L (ref 96–112)
Creatinine, Ser: 1.12 mg/dL (ref 0.4–1.2)
GFR calc Af Amer: >60 mL/min (ref >60)
GFR calc non Af Amer: 51 mL/min — ABNORMAL LOW (ref >60)
Glucose, Bld: 95 mg/dL (ref 70–99)
Potassium: 3.5 mEq/L (ref 3.5–5.1)
Sodium: 138 mEq/L (ref 135–145)
Total Bilirubin: 0.8 mg/dL (ref 0.3–1.2)
Total Protein: 6 g/dL (ref 6.0–8.3)

## 2011-04-17 LAB — CBC WITH DIFFERENTIAL/PLATELET
Basophils Absolute: 0.1 10*3/uL (ref 0.0–0.1)
Basophils Relative: 0 % (ref 0–1)
Eosinophils Absolute: 0.2 10*3/uL (ref 0.0–0.7)
Eosinophils Relative: 1 % (ref 0–5)
HCT: 33.9 % — ABNORMAL LOW (ref 36.0–46.0)
Hemoglobin: 10.9 g/dL — ABNORMAL LOW (ref 12.0–15.0)
Lymphocytes Relative: 22 % (ref 12–46)
Lymphs Abs: 4.9 10*3/uL — ABNORMAL HIGH (ref 0.7–4.0)
MCH: 29.9 pg (ref 26.0–34.0)
MCHC: 32.2 g/dL (ref 30.0–36.0)
MCV: 92.9 fL (ref 78.0–100.0)
Monocytes Absolute: 1.2 10*3/uL — ABNORMAL HIGH (ref 0.1–1.0)
Monocytes Relative: 5 % (ref 3–12)
Neutro Abs: 16.1 10*3/uL — ABNORMAL HIGH (ref 1.7–7.7)
Neutrophils Relative %: 71 % (ref 43–77)
Platelets: 692 10*3/uL — ABNORMAL HIGH (ref 150–400)
RBC: 3.65 MIL/uL — ABNORMAL LOW (ref 3.87–5.11)
RDW: 19.7 % — ABNORMAL HIGH (ref 11.5–15.5)
WBC: 22.5 10*3/uL — ABNORMAL HIGH (ref 4.0–10.5)

## 2011-04-17 LAB — CBC
HCT: 28.5 % — ABNORMAL LOW (ref 36.0–46.0)
Hemoglobin: 9.4 g/dL — ABNORMAL LOW (ref 12.0–15.0)
MCH: 29.8 pg (ref 26.0–34.0)
MCHC: 33 g/dL (ref 30.0–36.0)
MCV: 90.5 fL (ref 78.0–100.0)
Platelets: 542 10*3/uL — ABNORMAL HIGH (ref 150–400)
RBC: 3.15 MIL/uL — ABNORMAL LOW (ref 3.87–5.11)
RDW: 19.2 % — ABNORMAL HIGH (ref 11.5–15.5)
WBC: 16.3 10*3/uL — ABNORMAL HIGH (ref 4.0–10.5)

## 2011-04-17 LAB — URINE MICROSCOPIC-ADD ON

## 2011-04-17 LAB — D-DIMER, QUANTITATIVE: D-Dimer, Quant: 0.67 ug/mL-FEU — ABNORMAL HIGH (ref 0.00–0.48)

## 2011-04-17 LAB — POCT CARDIAC MARKERS
CKMB, poc: <1 ng/mL — ABNORMAL LOW (ref 1.0–8.0)
Myoglobin, poc: 63.2 ng/mL (ref 12–200)
Troponin i, poc: <0.05 ng/mL (ref 0.00–0.09)

## 2011-04-17 LAB — PRO B NATRIURETIC PEPTIDE: Pro B Natriuretic peptide (BNP): 217.9 pg/mL — ABNORMAL HIGH (ref 0–125)

## 2011-04-17 MED ORDER — IOHEXOL 300 MG/ML  SOLN
80.0000 mL | Freq: Once | INTRAMUSCULAR | Status: AC | PRN
  Administered 2011-04-17: 80 mL via INTRAVENOUS

## 2011-04-17 NOTE — Telephone Encounter (Signed)
I called this morning and spoke with Mr. Tesoriero her husband, she did start Levaquin yesterday, however she still remains very short of breath, dizzy, and just feels bad in general. I advise that her white blood count is elevated over 22,000, she is also a little anemic. Unfortunately the D-dimer test was not resulted given improper specimen tube. We discussed options, including continued treatment as outpatient, that this she feels the need to be hospitalized. I called and tried to get a direct admit, however they advised she go to the emergency department for workup first and to rule out blood clot. Thus her husband took her to [facility] Emergency department.

## 2011-04-18 ENCOUNTER — Encounter: Payer: Self-pay | Admitting: Medical

## 2011-04-18 ENCOUNTER — Telehealth: Payer: Self-pay | Admitting: Medical

## 2011-04-20 ENCOUNTER — Other Ambulatory Visit: Payer: Self-pay | Admitting: Family Medicine

## 2011-04-20 ENCOUNTER — Ambulatory Visit (INDEPENDENT_AMBULATORY_CARE_PROVIDER_SITE_OTHER): Payer: 59 | Admitting: Family Medicine

## 2011-04-20 VITALS — BP 100/68 | HR 72 | Temp 98.2°F | Ht 62.5 in | Wt 210.0 lb

## 2011-04-20 DIAGNOSIS — J189 Pneumonia, unspecified organism: Secondary | ICD-10-CM

## 2011-04-20 DIAGNOSIS — D473 Essential (hemorrhagic) thrombocythemia: Secondary | ICD-10-CM

## 2011-04-20 DIAGNOSIS — D75839 Thrombocytosis, unspecified: Secondary | ICD-10-CM

## 2011-04-20 DIAGNOSIS — D649 Anemia, unspecified: Secondary | ICD-10-CM

## 2011-04-20 DIAGNOSIS — Z79899 Other long term (current) drug therapy: Secondary | ICD-10-CM

## 2011-04-20 LAB — COMPREHENSIVE METABOLIC PANEL
ALT: 19 U/L (ref 0–35)
AST: 18 U/L (ref 0–37)
Albumin: 3.6 g/dL (ref 3.5–5.2)
Alkaline Phosphatase: 68 U/L (ref 39–117)
BUN: 21 mg/dL (ref 6–23)
CO2: 20 mEq/L (ref 19–32)
Calcium: 9.1 mg/dL (ref 8.4–10.5)
Chloride: 109 mEq/L (ref 96–112)
Creat: 0.93 mg/dL (ref 0.40–1.20)
Glucose, Bld: 99 mg/dL (ref 70–99)
Potassium: 3.8 mEq/L (ref 3.5–5.3)
Sodium: 140 mEq/L (ref 135–145)
Total Bilirubin: 0.3 mg/dL (ref 0.3–1.2)
Total Protein: 6.1 g/dL (ref 6.0–8.3)

## 2011-04-20 LAB — CBC WITH DIFFERENTIAL/PLATELET
Basophils Absolute: 0 10*3/uL (ref 0.0–0.1)
Basophils Relative: 0 % (ref 0–1)
Eosinophils Absolute: 0.3 10*3/uL (ref 0.0–0.7)
Eosinophils Relative: 3 % (ref 0–5)
HCT: 27.2 % — ABNORMAL LOW (ref 36.0–46.0)
Hemoglobin: 8.8 g/dL — ABNORMAL LOW (ref 12.0–15.0)
Lymphocytes Relative: 24 % (ref 12–46)
Lymphs Abs: 2.1 10*3/uL (ref 0.7–4.0)
MCH: 30.7 pg (ref 26.0–34.0)
MCHC: 32.4 g/dL (ref 30.0–36.0)
MCV: 94.8 fL (ref 78.0–100.0)
Monocytes Absolute: 0.7 10*3/uL (ref 0.1–1.0)
Monocytes Relative: 8 % (ref 3–12)
Neutro Abs: 5.7 10*3/uL (ref 1.7–7.7)
Neutrophils Relative %: 65 % (ref 43–77)
Platelets: 503 10*3/uL — ABNORMAL HIGH (ref 150–400)
RBC: 2.87 MIL/uL — ABNORMAL LOW (ref 3.87–5.11)
RDW: 20.9 % — ABNORMAL HIGH (ref 11.5–15.5)
WBC: 8.7 10*3/uL (ref 4.0–10.5)

## 2011-04-20 NOTE — Progress Notes (Signed)
Subjective:    Patient ID: [name], female    DOB: 12-27-1955, 55 y.o.   MRN: [id number]  HPI Patient is here to follow up on pneumonia, and to f/u on recent ER visit.  Asking about pulmonary referral--apparently this was suggested at ER visit, and has her very worried.  She had the antibiotics changed to Levaquin (originally treated with Doxycycline by an urgent care), and is starting to feel a little better.  Shortness of breath has been better over the last 2 days, still some SOB with exertion.  Denies fevers, cough is improving, nonproductive (never was productive).  Decreased energy/weakness, improving slightly.  Dizziness is improved/resolved.  Denies vomiting.  Had diarrhea last week, which flared up the hemorrhoids.  Much improved now.  Only had a small amount of bleeding with the hemorrhoids.  Denies any other source of bleeding (rare small amount from nose).  No dysuria  Using the nebulizer about 3 times a day, with improvement in breathing after. Has questions about ongoing use of nebulizer/refills.  Was noted to be anemic in the ER, worse from the day prior here at office.  WBC was improving (from 22 down to 16), and platelets were improving, but still elevated. Last Cr was 1.4 (?prior to CT scan).  Needs repeat labs today.  Old records were reviewed.  Last labs at [facility] 03/08/11 showing normal cmet (glucose 100), chol 153, TG 170, LDL 3, HDL 55, ratio 2.79.    Past Medical History  Diagnosis Date  . Fibromyalgia   . GERD (gastroesophageal reflux disease)   . Obstructive sleep apnea   . Anxiety   . Depression   . Restless leg syndrome   . Edema   . Migraine   . Hyperlipidemia   . Anemia     previously followed by Dr. Dalene Carrow for anemia and elevated platelets  . Fibroids   . Paresthesia     Dr. Antonietta Barcelona at [facility]  . Tremor     Dr. Antonietta Barcelona    Past Surgical History  Procedure Date  . Tonsillectomy 1968  . Knee surgery 1999    R knee, Dr. Renae Fickle, torn cartilage   . Cholecystectomy 9/05  . Endometrial ablation 10/07    Dr. Audie Box    History   Social History  . Marital Status: Married    Spouse Name: N/A    Number of Children: 2  . Years of Education: N/A   Occupational History  . customer service (on disability) [employer]   Social History Main Topics  . Smoking status: Never Smoker   . Smokeless tobacco: Never Used  . Alcohol Use: No  . Drug Use: No  . Sexually Active: Not on file   Other Topics Concern  . Not on file   Social History Narrative  . No narrative on file    Family History  Problem Relation Age of Onset  . Heart disease Mother     pacemaker  . Heart disease Father   . Heart disease Paternal Grandmother   . Heart disease Paternal Grandfather   . Asthma Sister   . Irritable bowel syndrome Sister     Current outpatient prescriptions:albuterol (PROVENTIL) (2.5 MG/3ML) 0.083% nebulizer solution, Take 3 mLs (2.5 mg total) by nebulization every 6 (six) hours as needed for wheezing., Disp: 100 mL, Rfl: 1;  albuterol (VENTOLIN HFA) 108 (90 BASE) MCG/ACT inhaler, Inhale 2 puffs into the lungs every 4 (four) hours as needed.  , Disp: , Rfl: ;  Cetirizine HCl 10 MG CAPS, Take 10 mg by mouth daily.  , Disp: , Rfl:  Cholecalciferol (VITAMIN D) 2000 UNITS tablet, Take 2,000 Units by mouth daily.  , Disp: , Rfl: ;  dronabinol (MARINOL) 5 MG capsule, Take 5 mg by mouth 2 (two) times daily before a meal.  , Disp: , Rfl: ;  DULoxetine (CYMBALTA) 60 MG capsule, Take 90 mg by mouth daily.  , Disp: , Rfl: ;  esomeprazole (NEXIUM) 40 MG capsule, Take 40 mg by mouth daily before breakfast.  , Disp: , Rfl:  Eszopiclone 3 MG TABS, Take 3 mg by mouth at bedtime. Take immediately before bedtime , Disp: , Rfl: ;  fluticasone (FLONASE) 50 MCG/ACT nasal spray, 2 sprays by Nasal route daily.  , Disp: , Rfl: ;  levofloxacin (LEVAQUIN) 500 MG tablet, Take 1 tablet (500 mg total) by mouth daily., Disp: 10 tablet, Rfl: 0;  LORazepam (ATIVAN) 0.5 MG  tablet, Take 0.5 mg by mouth every 4 (four) hours as needed.  , Disp: , Rfl:  modafinil (PROVIGIL) 200 MG tablet, Take 200 mg by mouth daily.  , Disp: , Rfl: ;  Nebulizers (COMPRESSOR/NEBULIZER) MISC, Use with albuterol q 4- 6hours prn, Disp: 1 each, Rfl: 0;  omega-3 acid ethyl esters (LOVAZA) 1 G capsule, Take 2 g by mouth 2 (two) times daily.  , Disp: , Rfl: ;  ondansetron (ZOFRAN) 4 MG tablet, Take 4 mg by mouth every 8 (eight) hours as needed.  , Disp: , Rfl:  pregabalin (LYRICA) 75 MG capsule, Take 75 mg by mouth 3 (three) times daily.  , Disp: , Rfl: ;  promethazine (PHENERGAN) 25 MG tablet, Take 25 mg by mouth every 6 (six) hours as needed.  , Disp: , Rfl: ;  rOPINIRole (REQUIP) 0.5 MG tablet, Take 0.5 mg by mouth 3 (three) times daily.  , Disp: , Rfl: ;  rosuvastatin (CRESTOR) 20 MG tablet, Take 20 mg by mouth daily.  , Disp: , Rfl:  sucralfate (CARAFATE) 1 GM/10ML suspension, Take 1 g by mouth 4 (four) times daily -  with meals and at bedtime.  , Disp: , Rfl: ;  topiramate (TOPAMAX) 50 MG tablet, Take 50 mg by mouth daily.  , Disp: , Rfl: ;  triamterene-hydrochlorothiazide (DYAZIDE) 37.5-25 MG per capsule, Take 1 capsule by mouth every morning.  , Disp: , Rfl: ;  pramoxine (PROCTOFOAM) 1 % foam, Place rectally every 2 (two) hours as needed for hemorrhoids., Disp: 15 g, Rfl: 0  Allergies  Allergen Reactions  . Erythromycin Nausea Only   Review of Systems See HPI.  Chronic nausea, no vomiting, no fevers, no URI symptoms.  Cough and SOB per HPI.  No urinary complaints, rash or other problems    Objective:   Physical Exam  Pleasant female, mild cushingoid appearance,  in no distress.  Accompanied by her husband BP 100/68  Pulse 72  Temp 98.2 F (36.8 C)  Ht 5' 2.5" (1.588 m)  Wt 210 lb (95.255 kg)  BMI 37.80 kg/m2 Neck: No lymphadenopathy or thyromegaly Heart:  Regular rate and rhythm, no murmurs, rubs, gallops or ectopy Lungs:  Clear bilaterally, without wheezes, rales or ronchi. Good  air movement throughout Extremities:  No clubbing, cyanosis or edema, 2+ pulses.  Neuro:  Alert and oriented x 3, cranial nerves grossly intact.  Normal gait Skin: no rashes or suspicious lesions Psych:  Normal mood, affect, hygiene and grooming, normal speech, eye contact      Assessment & Plan:  1. Pneumonia  CBC with Differential, DG Chest 2 View   Clinically improving. Repeat CXR in 3-4 weeks (sooner if ongoing symptoms)  2. Anemia, unspecified  CBC with Differential   normocytic, worsening; of new/recent onset  3. Encounter for long-term (current) use of other medications  Comprehensive metabolic panel  4. Thrombocytosis     improving with treatment of pneumonia   Discussed medications at length--albuterol nebulizer vs inhaler.  Instructed on proper technique for inhaler.  Use prn. CT results were reviewed with patient, and at this point I do not feel that pulmonary referral is warranted.  She is clinically improving from pneumonia, lung exam is clear, no evidence of significant pleural effusion on exam.  If having ongoing pulmonary symptoms (or persistent abnormality on f/u CXR) then will refer--patient and husband was reassured regarding her condition.    Anemia and elevated platelets--review of chart shows that she has had similar problems in the past, and was under the care of Dr. Dalene Carrow.  However, in all the records sent by [facility] (3 years), CBC's have been normal.  Will repeat labs, and consider referral back to Dr. Dalene Carrow if needed.  Discussed how low Hgb can contribute to DOE/SOB.  Follow up in 10 days, sooner prn

## 2011-04-20 NOTE — Patient Instructions (Signed)
You need to get a repeat chest x-ray around 6/20th to follow up on the pneumonia, and make sure it has completely resolved on x-ray.

## 2011-04-21 ENCOUNTER — Encounter: Payer: Self-pay | Admitting: Family Medicine

## 2011-04-21 DIAGNOSIS — F324 Major depressive disorder, single episode, in partial remission: Secondary | ICD-10-CM | POA: Insufficient documentation

## 2011-04-22 ENCOUNTER — Encounter: Payer: Self-pay | Admitting: Family Medicine

## 2011-04-24 ENCOUNTER — Telehealth: Payer: Self-pay | Admitting: *Deleted

## 2011-04-24 ENCOUNTER — Encounter: Payer: Self-pay | Admitting: *Deleted

## 2011-04-24 ENCOUNTER — Encounter: Payer: Self-pay | Admitting: Medical

## 2011-04-24 LAB — IRON: Iron: 66 ug/dL (ref 42–145)

## 2011-04-24 LAB — VITAMIN B12: Vitamin B-12: 340 pg/mL (ref 211–911)

## 2011-04-24 LAB — FOLATE: Folate: 14.3 ng/mL

## 2011-04-24 LAB — FERRITIN: Ferritin: 399 ng/mL — ABNORMAL HIGH (ref 10–291)

## 2011-04-24 NOTE — Telephone Encounter (Addendum)
Message copied by Dorthula Perfect on Tue Apr 24, 2011 12:44 PM ------      Message from: Joselyn Arrow      Created: Sun Apr 22, 2011  7:37 AM       Pt was advised of results.  ##Please add iron, ferritin, B12 and folate (dx 285.9). Thanks.##      If no cause for anemia is found, will refer back to Dr. Dalene Carrow. Infection resolving, normocytic anemia persists, elevated plt improving.     Added Iron, ferritin, B12, and folate to labs.  Will advise pt of results when labs come in.  CM<LPN

## 2011-04-25 ENCOUNTER — Telehealth: Payer: Self-pay | Admitting: *Deleted

## 2011-04-25 DIAGNOSIS — D649 Anemia, unspecified: Secondary | ICD-10-CM

## 2011-04-25 NOTE — Telephone Encounter (Signed)
Left message for pt informing her of normal iron,B12 and folate, also informed her that I will be referring her back to Dr.Odogwu([facility]) for follow up of amemia.

## 2011-05-02 ENCOUNTER — Encounter: Payer: Self-pay | Admitting: Family Medicine

## 2011-05-02 ENCOUNTER — Encounter (HOSPITAL_BASED_OUTPATIENT_CLINIC_OR_DEPARTMENT_OTHER): Payer: 59 | Admitting: Hematology and Oncology

## 2011-05-02 ENCOUNTER — Other Ambulatory Visit: Payer: Self-pay | Admitting: Hematology and Oncology

## 2011-05-02 ENCOUNTER — Ambulatory Visit (INDEPENDENT_AMBULATORY_CARE_PROVIDER_SITE_OTHER): Payer: 59 | Admitting: Family Medicine

## 2011-05-02 VITALS — BP 112/68 | HR 76 | Ht 62.5 in | Wt 209.0 lb

## 2011-05-02 DIAGNOSIS — D649 Anemia, unspecified: Secondary | ICD-10-CM | POA: Insufficient documentation

## 2011-05-02 DIAGNOSIS — R5381 Other malaise: Secondary | ICD-10-CM

## 2011-05-02 DIAGNOSIS — D473 Essential (hemorrhagic) thrombocythemia: Secondary | ICD-10-CM

## 2011-05-02 DIAGNOSIS — Z7982 Long term (current) use of aspirin: Secondary | ICD-10-CM

## 2011-05-02 DIAGNOSIS — IMO0001 Reserved for inherently not codable concepts without codable children: Secondary | ICD-10-CM

## 2011-05-02 DIAGNOSIS — J189 Pneumonia, unspecified organism: Secondary | ICD-10-CM

## 2011-05-02 DIAGNOSIS — D539 Nutritional anemia, unspecified: Secondary | ICD-10-CM

## 2011-05-02 DIAGNOSIS — R5383 Other fatigue: Secondary | ICD-10-CM

## 2011-05-02 LAB — CBC & DIFF AND RETIC
BASO%: 0.6 % (ref 0.0–2.0)
Basophils Absolute: 0.1 10*3/uL (ref 0.0–0.1)
EOS%: 5.8 % (ref 0.0–7.0)
Eosinophils Absolute: 0.6 10*3/uL — ABNORMAL HIGH (ref 0.0–0.5)
HCT: 37.1 % (ref 34.8–46.6)
HGB: 11.7 g/dL (ref 11.6–15.9)
Immature Retic Fract: 9.6 % (ref 0.00–10.70)
LYMPH%: 24.2 % (ref 14.0–49.7)
MCH: 29.8 pg (ref 25.1–34.0)
MCHC: 31.5 g/dL (ref 31.5–36.0)
MCV: 94.4 fL (ref 79.5–101.0)
MONO#: 0.8 10*3/uL (ref 0.1–0.9)
MONO%: 7.4 % (ref 0.0–14.0)
NEUT#: 6.2 10*3/uL (ref 1.5–6.5)
NEUT%: 62 % (ref 38.4–76.8)
Platelets: 412 10*3/uL — ABNORMAL HIGH (ref 145–400)
RBC: 3.93 10*6/uL (ref 3.70–5.45)
RDW: 18.3 % — ABNORMAL HIGH (ref 11.2–14.5)
Retic %: 3.1 % — ABNORMAL HIGH (ref 0.50–1.50)
Retic Ct Abs: 121.83 10*3/uL — ABNORMAL HIGH (ref 18.30–72.70)
WBC: 10.1 10*3/uL (ref 3.9–10.3)
lymph#: 2.4 10*3/uL (ref 0.9–3.3)

## 2011-05-02 LAB — MORPHOLOGY
PLT EST: INCREASED
RBC Comments: NORMAL

## 2011-05-02 LAB — URINALYSIS, MICROSCOPIC - CHCC
Bilirubin (Urine): NEGATIVE
Blood: NEGATIVE
Glucose: NEGATIVE g/dL
Ketones: NEGATIVE mg/dL
Nitrite: NEGATIVE
Protein: NEGATIVE mg/dL
Specific Gravity, Urine: 1.02 (ref 1.003–1.035)
pH: 6 (ref 4.6–8.0)

## 2011-05-02 NOTE — Progress Notes (Signed)
SUBJECTIVE: Patient presents for follow-up on pneumonia.  Cough has completely resolved.  Denies fevers or chills.  Still has some residual shortness of breath/DOE and decreased energy, but also is dealing with a worsening anemia.  Has an appointment with Dr. Dalene Carrow for later this afternoon.  Breathing is much improved, no longer needing to use the inhaler at all.  No further diarrhea, but having constipation, which has flared up hemorrhoids.  Only small amount of bleeding.  Constipation has been worse since taking iron; she started using probiotics.  Rx'd medication is helping some with the hemorrhoids.  Patient states that she never heard back from [facility] regarding dietician referral (done at her first visit here)  Past Medical History  Diagnosis Date  . Fibromyalgia   . GERD (gastroesophageal reflux disease)   . Obstructive sleep apnea   . Anxiety   . Depression   . Restless leg syndrome   . Edema   . Migraine   . Hyperlipidemia   . Anemia     previously followed by Dr. Dalene Carrow for anemia and elevated platelets  . Fibroids   . Paresthesia     Dr. Antonietta Barcelona at [facility]  . Tremor     Dr. Antonietta Barcelona    Past Surgical History  Procedure Date  . Tonsillectomy 1968  . Knee surgery 1999    R knee, Dr. Renae Fickle, torn cartilage  . Cholecystectomy 9/05  . Endometrial ablation 10/07    Dr. Audie Box    History   Social History  . Marital Status: Married    Spouse Name: N/A    Number of Children: 2  . Years of Education: N/A   Occupational History  . customer service (on disability) [employer]   Social History Main Topics  . Smoking status: Never Smoker   . Smokeless tobacco: Never Used  . Alcohol Use: No  . Drug Use: No  . Sexually Active: Not on file   Other Topics Concern  . Not on file   Social History Narrative  . No narrative on file    Family History  Problem Relation Age of Onset  . Heart disease Mother     pacemaker  . Heart disease Father   . Heart  disease Paternal Grandmother   . Heart disease Paternal Grandfather   . Asthma Sister   . Irritable bowel syndrome Sister     Current outpatient prescriptions:Cetirizine HCl 10 MG CAPS, Take 10 mg by mouth daily.  , Disp: , Rfl: ;  Cholecalciferol (VITAMIN D) 2000 UNITS tablet, Take 2,000 Units by mouth daily.  , Disp: , Rfl: ;  dronabinol (MARINOL) 5 MG capsule, Take 5 mg by mouth 2 (two) times daily before a meal.  , Disp: , Rfl: ;  DULoxetine (CYMBALTA) 60 MG capsule, Take 90 mg by mouth daily.  , Disp: , Rfl:  esomeprazole (NEXIUM) 40 MG capsule, Take 40 mg by mouth daily before breakfast.  , Disp: , Rfl: ;  Eszopiclone 3 MG TABS, Take 3 mg by mouth at bedtime. Take immediately before bedtime , Disp: , Rfl: ;  fluticasone (FLONASE) 50 MCG/ACT nasal spray, 2 sprays by Nasal route daily.  , Disp: , Rfl: ;  LORazepam (ATIVAN) 0.5 MG tablet, Take 0.5 mg by mouth every 4 (four) hours as needed.  , Disp: , Rfl:  modafinil (PROVIGIL) 200 MG tablet, Take 200 mg by mouth daily.  , Disp: , Rfl: ;  omega-3 acid ethyl esters (LOVAZA) 1 G capsule,  Take 2 g by mouth 2 (two) times daily.  , Disp: , Rfl: ;  ondansetron (ZOFRAN) 4 MG tablet, Take 4 mg by mouth every 8 (eight) hours as needed.  , Disp: , Rfl: ;  pregabalin (LYRICA) 75 MG capsule, Take 75 mg by mouth 3 (three) times daily.  , Disp: , Rfl:  promethazine (PHENERGAN) 25 MG tablet, Take 25 mg by mouth every 6 (six) hours as needed.  , Disp: , Rfl: ;  rOPINIRole (REQUIP) 0.5 MG tablet, Take 0.5 mg by mouth 3 (three) times daily.  , Disp: , Rfl: ;  rosuvastatin (CRESTOR) 20 MG tablet, Take 20 mg by mouth daily.  , Disp: , Rfl: ;  sucralfate (CARAFATE) 1 GM/10ML suspension, Take 1 g by mouth 4 (four) times daily -  with meals and at bedtime.  , Disp: , Rfl:  topiramate (TOPAMAX) 50 MG tablet, Take 50 mg by mouth daily.  , Disp: , Rfl: ;  triamterene-hydrochlorothiazide (DYAZIDE) 37.5-25 MG per capsule, Take 1 capsule by mouth every morning.  , Disp: , Rfl: ;   albuterol (PROVENTIL) (2.5 MG/3ML) 0.083% nebulizer solution, Take 3 mLs (2.5 mg total) by nebulization every 6 (six) hours as needed for wheezing., Disp: 100 mL, Rfl: 1 albuterol (VENTOLIN HFA) 108 (90 BASE) MCG/ACT inhaler, Inhale 2 puffs into the lungs every 4 (four) hours as needed.  , Disp: , Rfl: ;  Nebulizers (COMPRESSOR/NEBULIZER) MISC, Use with albuterol q 4- 6hours prn, Disp: 1 each, Rfl: 0  Allergies  Allergen Reactions  . Erythromycin Nausea Only   ROS: Denies chest pain, palpitations.  Some nausea (chronic, stable), vomited once 4 days ago (related to gastroparesis). Denies congestion/sinus problems, cough, skin rash, or other concerns  PHYSICAL EXAM: BP 112/68  Pulse 76  Ht 5' 2.5" (1.588 m)  Wt 209 lb (94.802 kg)  BMI 37.62 kg/m2 Well developed, pleasant female, in no distress. Mild cushingoid appearance. Speaking easily in full sentences, no cough. Neck: no lymphadenopathy or mass Heart:  Regular rate and rhythm Lungs:  Clear bilaterally, without wheezes, rales, ronchi or dullness to percussion Extremities:  No cyanosis or edema  ASSESSMENT/PLAN: 1. Pneumonia    Clinically resolved.  Reminded to get repeat CXR in 1-2 weeks. Some residual fatigue  2. Fatigue    likely due to a combination of recent pneumonia, and anemia  3. Anemia, unspecified    Likely is contributing to her fatigue and DOE. Has appt scheduled with Dr. Dalene Carrow this afternoon, therefore no labs ordered    F/u with Dr. Dalene Carrow as scheduled.  She likely can stop the iron (iron levels normal), but should verify with Dr. Dalene Carrow. Suzette Battiest spoke with [facility] regarding nutrition referral.  They had trouble reaching her and will try again.

## 2011-05-03 ENCOUNTER — Encounter: Payer: 59 | Admitting: Hematology and Oncology

## 2011-05-04 LAB — LACTATE DEHYDROGENASE: LDH: 161 U/L (ref 94–250)

## 2011-05-04 LAB — PROTEIN ELECTROPHORESIS, SERUM, WITH REFLEX
Albumin ELP: 54.2 % — ABNORMAL LOW (ref 55.8–66.1)
Alpha-1-Globulin: 5.6 % — ABNORMAL HIGH (ref 2.9–4.9)
Alpha-2-Globulin: 11.6 % (ref 7.1–11.8)
Beta 2: 6.2 % (ref 3.2–6.5)
Beta Globulin: 6.7 % (ref 4.7–7.2)
Gamma Globulin: 15.7 % (ref 11.1–18.8)
Total Protein, Serum Electrophoresis: 6.8 g/dL (ref 6.0–8.3)

## 2011-05-04 LAB — IRON AND TIBC
%SAT: 13 % — ABNORMAL LOW (ref 20–55)
Iron: 42 ug/dL (ref 42–145)
TIBC: 325 ug/dL (ref 250–470)
UIBC: 283 ug/dL

## 2011-05-04 LAB — COMPREHENSIVE METABOLIC PANEL
ALT: 26 U/L (ref 0–35)
AST: 21 U/L (ref 0–37)
Albumin: 4.2 g/dL (ref 3.5–5.2)
Alkaline Phosphatase: 85 U/L (ref 39–117)
BUN: 17 mg/dL (ref 6–23)
CO2: 26 mEq/L (ref 19–32)
Calcium: 9 mg/dL (ref 8.4–10.5)
Chloride: 108 mEq/L (ref 96–112)
Creatinine, Ser: 0.85 mg/dL (ref 0.50–1.10)
Glucose, Bld: 141 mg/dL — ABNORMAL HIGH (ref 70–99)
Potassium: 3.7 mEq/L (ref 3.5–5.3)
Sodium: 142 mEq/L (ref 135–145)
Total Bilirubin: 0.3 mg/dL (ref 0.3–1.2)
Total Protein: 6.8 g/dL (ref 6.0–8.3)

## 2011-05-04 LAB — DIRECT ANTIGLOBULIN TEST (NOT AT ARMC)
DAT (Complement): POSITIVE — AB
DAT IgG: NEGATIVE

## 2011-05-04 LAB — FERRITIN: Ferritin: 120 ng/mL (ref 10–291)

## 2011-05-04 LAB — FOLATE: Folate: >20 ng/mL

## 2011-05-04 LAB — VITAMIN B12: Vitamin B-12: 393 pg/mL (ref 211–911)

## 2011-05-04 LAB — HAPTOGLOBIN: Haptoglobin: 137 mg/dL (ref 30–200)

## 2011-05-07 LAB — BCR/ABL (LIO MMD)

## 2011-05-08 NOTE — Telephone Encounter (Signed)
DONE BY CORA/SHANE

## 2011-05-09 ENCOUNTER — Telehealth: Payer: Self-pay | Admitting: Family Medicine

## 2011-05-09 MED ORDER — OMEGA-3-ACID ETHYL ESTERS 1 G PO CAPS: 2.0000 g | ORAL_CAPSULE | Freq: Two times a day (BID) | ORAL | Status: DC

## 2011-05-09 NOTE — Telephone Encounter (Signed)
Advise pt Lovaza refill was sent to pharmacy.  Our records indicate 2gm BID; her message said takes twice daily (not 2 pills twice daily).  I sent for 2 pills twice daily, as this is the usual recommended dose. Thanks

## 2011-05-10 ENCOUNTER — Encounter (HOSPITAL_BASED_OUTPATIENT_CLINIC_OR_DEPARTMENT_OTHER): Payer: 59 | Admitting: Hematology and Oncology

## 2011-05-10 DIAGNOSIS — R5381 Other malaise: Secondary | ICD-10-CM

## 2011-05-10 DIAGNOSIS — D473 Essential (hemorrhagic) thrombocythemia: Secondary | ICD-10-CM

## 2011-05-10 DIAGNOSIS — D509 Iron deficiency anemia, unspecified: Secondary | ICD-10-CM

## 2011-05-10 NOTE — Telephone Encounter (Signed)
Confirmed with patient, she does take 2 pills twice a day.

## 2011-05-15 ENCOUNTER — Telehealth: Payer: Self-pay | Admitting: Family Medicine

## 2011-05-15 ENCOUNTER — Ambulatory Visit (INDEPENDENT_AMBULATORY_CARE_PROVIDER_SITE_OTHER): Payer: 59 | Admitting: Family Medicine

## 2011-05-15 ENCOUNTER — Encounter: Payer: Self-pay | Admitting: Family Medicine

## 2011-05-15 ENCOUNTER — Ambulatory Visit
Admission: RE | Admit: 2011-05-15 | Discharge: 2011-05-15 | Disposition: A | Discharge status: Home / Self Care | Payer: 59 | Source: Ambulatory Visit | Attending: Family Medicine | Admitting: Family Medicine

## 2011-05-15 VITALS — BP 136/88 | HR 68 | Ht 62.5 in | Wt 214.0 lb

## 2011-05-15 DIAGNOSIS — K625 Hemorrhage of anus and rectum: Secondary | ICD-10-CM

## 2011-05-15 DIAGNOSIS — K644 Residual hemorrhoidal skin tags: Secondary | ICD-10-CM

## 2011-05-15 DIAGNOSIS — J189 Pneumonia, unspecified organism: Secondary | ICD-10-CM

## 2011-05-15 DIAGNOSIS — R0609 Other forms of dyspnea: Secondary | ICD-10-CM

## 2011-05-15 DIAGNOSIS — R0600 Dyspnea, unspecified: Secondary | ICD-10-CM

## 2011-05-15 DIAGNOSIS — K648 Other hemorrhoids: Secondary | ICD-10-CM

## 2011-05-15 DIAGNOSIS — D649 Anemia, unspecified: Secondary | ICD-10-CM

## 2011-05-15 LAB — POCT HEMOGLOBIN: Hemoglobin: 13.4

## 2011-05-15 MED ORDER — HYDROCORTISONE ACETATE 25 MG RE SUPP: 25.0000 mg | Freq: Two times a day (BID) | RECTAL | Status: AC

## 2011-05-15 MED ORDER — HYDROCORTISONE 2.5 % RE CREA: TOPICAL_CREAM | RECTAL | Status: DC

## 2011-05-15 NOTE — Progress Notes (Signed)
  Subjective:    Patient ID: [name], female    DOB: Jan 14, 1956, 55 y.o.   MRN: [id number]  HPI Patient presents with rectal bleeding.  Had been having some blood on the toilet paper after bowel movements over the last 2 weeks, but 4 days ago had significant bleeding.  Had 3 episodes of heavy bleeding noted on TP after bowel movements, and toilet water was red.  She has some chronic constipation due to her diet, worse since taking iron recently.  She had normal stools for the next two days, without any blood noted, but then yesterday she again saw more blood in the toilet and on toilet paper. She notes that she didn't strain with this last bowel movement.  She has known hemorrhoids, she believes they are external.  Has been treating with Proctofoam, but has been inserting this, as directed on instructions.  Denies any abdominal pain, fevers.  Last colonoscopy was 5 years ago, and patient reports she just got notification that she is due for repeat.  She is questioning whether this is necessary.  Chart was reviewed, and colonoscopy report was not in her records.  Therefore, without knowing what her last colonoscopy showed (ie polyps), I leave that determination up to her GI doctor.  Patient reports that she is still having some SOB with exertion--just putting laundry into washing machine, and other minimal activities.  She is planning on getting her follow-up chest x-ray today (f/u her pneumonia last month) Review of Systems No fevers, abdominal pain, no cough or URI symptoms.  Gastroparesis is stable.      Objective:   Physical Exam Blood pressure 136/88, pulse 68, height 5' 2.5" (1.588 m), weight 214 lb (97.07 kg). Pleasant, somewhat cushingoid-appearing female, in no distress Heart:  Regular rate and rhythm Lungs:  Clear bilaterally without wheezes, rales or ronchi Abdomen:  Soft, nontender Rectal:  External hemorrhoids, mildly inflamed, but no evidence of recent bleed.  No  fissure Anoscopy--internal hemorrhoids, inflamed.  No active bleeding noted, but appears to have recently bled       Assessment & Plan:   1. Internal hemorrhoid, bleeding  hydrocortisone (ANUSOL-HC) 25 MG suppository  2. External hemorrhoid  hydrocortisone (ANUSOL-HC) 2.5 % rectal cream  3. Rectal bleeding  POCT hemoglobin, PR Diagnostic anoscopy  4. Anemia, unspecified     Improving; monitored by Dr. Dalene Carrow.  Hg normal today (despite hemorrhoidal bleeding)  5. Dyspnea     with exertion, persistent since pneumonia last month   Chest x-ray showed near-complete resolution of infiltrate.  Given that she is still having significant dyspnea with exertion (and not entirely normal CXR with abnormal CT at time of pneumonia) will refer to pulmonary for evaluation.  Left detailed message on pt's voicemail with this information

## 2011-05-15 NOTE — Telephone Encounter (Signed)
There is no way for me to answer that question without evaluating her.  I'm happy to see her today to evaluate rectal bleeding.  Hemorrhoids are a possible source of rectal bleeding, but without evaluation, I cannot really reassure her

## 2011-05-15 NOTE — Progress Notes (Signed)
Pt notified of hgb results.

## 2011-05-15 NOTE — Telephone Encounter (Signed)
CALLED PT. COMING IN TODAY

## 2011-05-22 ENCOUNTER — Encounter: Payer: 59 | Attending: Family Medicine | Admitting: *Deleted

## 2011-05-22 DIAGNOSIS — R110 Nausea: Secondary | ICD-10-CM | POA: Insufficient documentation

## 2011-05-22 DIAGNOSIS — K3184 Gastroparesis: Secondary | ICD-10-CM | POA: Insufficient documentation

## 2011-05-22 DIAGNOSIS — Z713 Dietary counseling and surveillance: Secondary | ICD-10-CM | POA: Insufficient documentation

## 2011-05-23 ENCOUNTER — Encounter: Payer: Self-pay | Admitting: Critical Care Medicine

## 2011-05-25 ENCOUNTER — Ambulatory Visit (INDEPENDENT_AMBULATORY_CARE_PROVIDER_SITE_OTHER): Payer: 59 | Admitting: Critical Care Medicine

## 2011-05-25 ENCOUNTER — Encounter: Payer: Self-pay | Admitting: Critical Care Medicine

## 2011-05-25 VITALS — BP 116/80 | HR 108 | Temp 98.1°F | Ht 62.5 in | Wt 210.2 lb

## 2011-05-25 DIAGNOSIS — J690 Pneumonitis due to inhalation of food and vomit: Secondary | ICD-10-CM

## 2011-05-25 NOTE — Patient Instructions (Signed)
No change in medications. Return in       2 months for recheck Call sooner if symptoms return

## 2011-05-25 NOTE — Progress Notes (Signed)
Subjective:    Patient ID: [name], female    DOB: [birth year], 55 y.o.   MRN: [id number]  HPI 55 y.o. WF  Very poor historian, no records  Dx Pna first weekend in May  .  Urgent Care  Saw the pt.  Rx ABX .  Went back in one week, still weak and dyspneic,  Then went to ED.  CT 04/17/11 Ground glass infilt and PNA.  Rescue, ABX end of may to June.   Since this time,  Has improved.   Did have DOE, now is better. Was rx prednisone ,  Mid May and off since end of may   Review of Systems  Constitutional: Positive for fatigue. Negative for fever, chills, diaphoresis, activity change, appetite change and unexpected weight change.  HENT: Positive for voice change. Negative for hearing loss, ear pain, nosebleeds, congestion, sore throat, facial swelling, rhinorrhea, sneezing, mouth sores, trouble swallowing, neck pain, neck stiffness, dental problem, postnasal drip, sinus pressure, tinnitus and ear discharge.        Notes hoarseness, emesis and this makes pt hoarse   Eyes: Negative for photophobia, discharge, itching and visual disturbance.  Respiratory: Negative for apnea, cough, choking, chest tightness, shortness of breath, wheezing and stridor.   Cardiovascular: Negative for chest pain, palpitations and leg swelling.  Gastrointestinal: Positive for nausea, vomiting, constipation and blood in stool. Negative for abdominal pain and abdominal distention.  Genitourinary: Positive for frequency. Negative for dysuria, urgency, hematuria, flank pain, decreased urine volume and difficulty urinating.  Musculoskeletal: Negative for myalgias, back pain, joint swelling, arthralgias and gait problem.  Skin: Negative for color change, pallor and rash.  Neurological: Positive for tremors, weakness and headaches. Negative for dizziness, seizures, syncope, speech difficulty, light-headedness and numbness.  Hematological: Negative for adenopathy. Does not bruise/bleed easily.  Psychiatric/Behavioral: Positive  for sleep disturbance. Negative for confusion and agitation. The patient is nervous/anxious.        Objective:   Physical Exam Filed Vitals:   05/25/11 1514  BP: 116/80  Pulse: 108  Temp: 98.1 F (36.7 C)  TempSrc: Oral  Height: 5' 2.5" (1.588 m)  Weight: 210 lb 3.2 oz (95.346 kg)  SpO2: 95%    Gen: Pleasant, well-nourished, in no distress,  normal affect  ENT: No lesions,  mouth clear,  oropharynx clear, no postnasal drip  Neck: No JVD, no TMG, no carotid bruits  Lungs: No use of accessory muscles, no dullness to percussion, clear without rales or rhonchi  Cardiovascular: RRR, heart sounds normal, no murmur or gallops, no peripheral edema  Abdomen: soft and NT, no HSM,  BS normal  Musculoskeletal: No deformities, no cyanosis or clubbing  Neuro: alert, non focal  Skin: Warm, no lesions or rashes       Chest Xray 05/15/11: resolved pneumonitis   Assessment & Plan:   Pneumonia, aspiration Probable aspiration with pneumonitis,  Now resolved. Pt with gastroparesis and likely reflux induced aspiration Plan  No further w/u needed F/u if worsens     Updated Medication List Outpatient Encounter Prescriptions as of 05/25/2011  Medication Sig Dispense Refill  . albuterol (VENTOLIN HFA) 108 (90 BASE) MCG/ACT inhaler Inhale 2 puffs into the lungs every 4 (four) hours as needed.        . Cetirizine HCl 10 MG CAPS Take 10 mg by mouth daily.        . Cholecalciferol (VITAMIN D) 2000 UNITS tablet Take 2,000 Units by mouth daily.        Marland Kitchen  dronabinol (MARINOL) 5 MG capsule Take 5 mg by mouth 2 (two) times daily before a meal.        . DULoxetine (CYMBALTA) 60 MG capsule Take 90 mg by mouth daily.        Marland Kitchen esomeprazole (NEXIUM) 40 MG capsule Take 40 mg by mouth daily before breakfast.        . Eszopiclone 3 MG TABS Take 3 mg by mouth at bedtime. Take immediately before bedtime       . fluticasone (FLONASE) 50 MCG/ACT nasal spray 2 sprays by Nasal route daily.        Marland Kitchen  HEMATOGEN FORTE 460-60-0.01-1 MG CAPS 1 capsule everyday      . hydrocortisone (ANUSOL-HC) 2.5 % rectal cream Apply to external hemorrhoids three times daily as needed for inflamed hemorrhoids  30 g  1  . hydrocortisone (ANUSOL-HC) 25 MG suppository Place 1 suppository (25 mg total) rectally 2 (two) times daily.  12 suppository  1  . LORazepam (ATIVAN) 0.5 MG tablet Take 0.5 mg by mouth every 4 (four) hours as needed.        . modafinil (PROVIGIL) 200 MG tablet Take 200 mg by mouth daily.        . NON FORMULARY Domperidone 20mg  four times daily       . omega-3 acid ethyl esters (LOVAZA) 1 G capsule Take 2 capsules (2 g total) by mouth 2 (two) times daily.  360 capsule  1  . ondansetron (ZOFRAN) 4 MG tablet Take 4 mg by mouth every 8 (eight) hours as needed.        . pregabalin (LYRICA) 75 MG capsule Take 75 mg by mouth 3 (three) times daily.        . prochlorperazine (COMPAZINE) 10 MG tablet As needed      . promethazine (PHENERGAN) 25 MG tablet Take 25 mg by mouth every 6 (six) hours as needed.        Marland Kitchen rOPINIRole (REQUIP) 0.5 MG tablet Take 0.5 mg by mouth. Take three at bedtime      . rosuvastatin (CRESTOR) 20 MG tablet Take 20 mg by mouth daily.        . sucralfate (CARAFATE) 1 GM/10ML suspension Take 1 g by mouth 4 (four) times daily -  with meals and at bedtime.        . SUMAtriptan (IMITREX) 50 MG tablet As needed      . topiramate (TOPAMAX) 50 MG tablet Take 50 mg by mouth daily.        . traMADol (ULTRAM) 50 MG tablet As needed      . triamterene-hydrochlorothiazide (DYAZIDE) 37.5-25 MG per capsule Take 1 capsule by mouth every morning.

## 2011-05-27 NOTE — Assessment & Plan Note (Signed)
Probable aspiration with pneumonitis,  Now resolved. Pt with gastroparesis and likely reflux induced aspiration Plan  No further w/u needed F/u if worsens

## 2011-06-05 ENCOUNTER — Telehealth: Payer: Self-pay | Admitting: Family Medicine

## 2011-06-05 DIAGNOSIS — G43909 Migraine, unspecified, not intractable, without status migrainosus: Secondary | ICD-10-CM

## 2011-06-05 MED ORDER — SUMATRIPTAN SUCCINATE 50 MG PO TABS: 50.0000 mg | ORAL_TABLET | ORAL | Status: DC | PRN

## 2011-06-05 NOTE — Telephone Encounter (Signed)
E-rx'd to pharmacy

## 2011-06-18 ENCOUNTER — Encounter: Payer: Self-pay | Admitting: *Deleted

## 2011-06-18 ENCOUNTER — Encounter: Payer: 59 | Attending: Family Medicine | Admitting: *Deleted

## 2011-06-18 DIAGNOSIS — R110 Nausea: Secondary | ICD-10-CM | POA: Insufficient documentation

## 2011-06-18 DIAGNOSIS — Z713 Dietary counseling and surveillance: Secondary | ICD-10-CM | POA: Insufficient documentation

## 2011-06-18 DIAGNOSIS — K3184 Gastroparesis: Secondary | ICD-10-CM | POA: Insufficient documentation

## 2011-06-18 NOTE — Patient Instructions (Addendum)
Goals:  Try Chobani yogurt in smoothies  Continue to supplement diet with (2) Ensure Clear as tolerated  Eat 3-6 small meals/snacks daily (every 3-5 hrs)  Increase lean protein foods to meet 90g goal  Aim for >30 min of physical activity daily   Limit meals out to decrease sodium intake

## 2011-06-18 NOTE — Progress Notes (Signed)
  Medical Nutrition Therapy:  Appt start time: 1130 end time:  1200.  Assessment:  Primary concerns today: Follow up.  Pt continues to struggle with nausea and some diarrhea r/t to severe gastroparesis.  Has been able to tolerate the peach Ensure Clear, yogurt, frozen fruit, salmon, broiled scallops, and some fiber. Reports some meals out with increased Na.  Continues to be depressed about weight, though was happy that she was down 5 lbs today. States she was sick in bed for 3-4 days last week r/t dx and nausea.  Not able to tolerate much exercise as her FM acts up.   MEDICATIONS: Nexium, Triam/HCTZ, Lyrica, Crestor, Zofran, Domperidone, Vit D, Dronabinol (Marinol), Vitafusion (MVI)  DIETARY INTAKE 24-hr recall:  B (AM): Gatorade; yogurt or smoothie  Snk (AM): none/some Ensure clear  L (PM): skinless chicken wing, mac-n-cheese (1/4 c); baked or sweet potato  Snk (PM): Ensure clear D (PM): Broiled scallops, salmon, mac-n-cheese @ restaurant Snk (PM):   Recent physical activity: None noted; Causes FM to flare.  Estimated needs: 1200 calories 150 g carbohydrates 90 g protein 30 g fat  Progress Towards Goal(s):  Some progress.   Nutritional Diagnosis:  [place]-1.4 Altered GI function related to nausea and diarrhea as evidenced by diagnosis of severe gastroparesis and referral by MD.    Intervention/Goals:  Try Chobani yogurt in smoothies to add protein  Continue to supplement diet with 2 bottles Ensure Clear daily as tolerated  Eat 3-6 small meals/snacks daily (every 3-5 hrs)  Increase lean protein foods to meet 90g goal  Aim for 20-30 min of physical activity daily   Limit meals out to decrease sodium intake  Continue to avoid high fat foods  Monitoring/Evaluation:  Dietary intake, exercise, GI symptoms, and body weight in 4 week(s).

## 2011-06-20 ENCOUNTER — Institutional Professional Consult (permissible substitution): Payer: 59 | Admitting: Critical Care Medicine

## 2011-06-26 ENCOUNTER — Institutional Professional Consult (permissible substitution): Payer: 59 | Admitting: Pulmonary Disease

## 2011-07-09 ENCOUNTER — Telehealth: Payer: Self-pay | Admitting: *Deleted

## 2011-07-09 NOTE — Telephone Encounter (Signed)
na

## 2011-07-10 ENCOUNTER — Telehealth: Payer: Self-pay | Admitting: Family Medicine

## 2011-07-11 ENCOUNTER — Telehealth: Payer: Self-pay | Admitting: *Deleted

## 2011-07-11 MED ORDER — ROSUVASTATIN CALCIUM 20 MG PO TABS: 20.0000 mg | ORAL_TABLET | Freq: Every day | ORAL | Status: DC

## 2011-07-11 NOTE — Telephone Encounter (Signed)
Phone call

## 2011-07-11 NOTE — Telephone Encounter (Signed)
Old records reviewed in chart.  Lipids last checked 02/2011. Refill sent.  Please advise pt refill sent, and that she will be due for visit with fasting labs (prior if she prefers, or else done at visit is fine) in middle-end of October.  Thanks

## 2011-07-11 NOTE — Telephone Encounter (Signed)
Left message for patient that Dr.Knapp called in her Crestor to [facility]. Also left message for her to schedule fasting appointment to check lipids mid-late October, she can fast for the appt or she can come in prior to appt for lipids.

## 2011-07-16 ENCOUNTER — Ambulatory Visit: Payer: 59 | Admitting: *Deleted

## 2011-07-25 ENCOUNTER — Encounter: Payer: Self-pay | Admitting: Family Medicine

## 2011-07-25 ENCOUNTER — Ambulatory Visit (INDEPENDENT_AMBULATORY_CARE_PROVIDER_SITE_OTHER): Payer: 59 | Admitting: Family Medicine

## 2011-07-25 ENCOUNTER — Ambulatory Visit: Payer: 59 | Admitting: Critical Care Medicine

## 2011-07-25 VITALS — BP 124/76 | HR 72 | Ht 62.5 in | Wt 203.0 lb

## 2011-07-25 DIAGNOSIS — L299 Pruritus, unspecified: Secondary | ICD-10-CM

## 2011-07-25 MED ORDER — HYDROXYZINE HCL 50 MG PO TABS: ORAL_TABLET | ORAL | Status: DC

## 2011-07-25 NOTE — Progress Notes (Signed)
Chief Complaint: itching from head to toe, was taking Tramadol but realized that it was making her itch. Dr.Basset changed med to Tylenol 3 and that made her itch as well. She stopped taking the Tylenol and she is still itching. Last night vomited also in addition to the itching.   Problems with constant itching since "Sunday.  Prior to that, had been off and on, seeming to be related to when she took the Tramadol.  She's under the care of Dr. Bassett for plantar fasciitis.  She was given a topical medication, described as an anti-inflammatory and pain medication that is compounded, came from a mail order pharmacy.  Changed from Tramadol to Tylenol #3 2 weeks ago but stopped taking it when she realized it made her itch also.  Has been off all pain medications recently, yet since Sunday, is still itching.  Has been using Benadryl, Aveeno baths which only soothes it a little bit.  Scalp, eyes, ears are all very itchy.  Also itchy under her arms. Nose is itchy, to the point where she rubbed a place raw on the bottom of her nose.  Denies rashes. Denies any new products--no new soaps, detergents, fabric softeners, new foods, or others that have been itching.  Went to the beach 2 weeks ago, stayed in a condo, but brought her own sheets.  Husband isn't having any problems.  No others are itching  Past Medical History  Diagnosis Date  . Fibromyalgia   . GERD (gastroesophageal reflux disease)   . Obstructive sleep apnea     On Cpap 2009  . Anxiety   . Depression   . Restless leg syndrome   . Edema   . Migraine   . Hyperlipidemia   . Anemia     previously followed by Dr. Odogwu for anemia and elevated platelets  . Fibroids   . Paresthesia     Dr. Tonuzi at [facility]  . Tremor     Dr. Tonuzi  . Gastroparesis   . Pneumonia     20" 12  . Plantar fasciitis 02/2011    dx end of month   Current Outpatient Prescriptions on File Prior to Visit  Medication Sig Dispense Refill  . albuterol  (VENTOLIN HFA) 108 (90 BASE) MCG/ACT inhaler Inhale 2 puffs into the lungs every 4 (four) hours as needed.        . Cetirizine HCl 10 MG CAPS Take 10 mg by mouth daily.        . Cholecalciferol (VITAMIN D) 2000 UNITS tablet Take 2,000 Units by mouth daily.        Marland Kitchen dronabinol (MARINOL) 5 MG capsule Take 5 mg by mouth 2 (two) times daily before a meal.        . DULoxetine (CYMBALTA) 60 MG capsule Take 90 mg by mouth daily.        Marland Kitchen esomeprazole (NEXIUM) 40 MG capsule Take 40 mg by mouth daily before breakfast.        . Eszopiclone 3 MG TABS Take 3 mg by mouth at bedtime. Take immediately before bedtime      . fluticasone (FLONASE) 50 MCG/ACT nasal spray 2 sprays by Nasal route daily.        Marland Kitchen HEMATOGEN FORTE 460-60-0.01-1 MG CAPS 1 capsule everyday      . hydrocortisone (ANUSOL-HC) 2.5 % rectal cream Apply to external hemorrhoids three times daily as needed for inflamed hemorrhoids  30 g  1  . LORazepam (ATIVAN) 0.5 MG tablet Take 0.5  mg by mouth every 4 (four) hours as needed.        . modafinil (PROVIGIL) 200 MG tablet Take 200 mg by mouth daily.        . Multiple Vitamin (MULTIVITAMIN PO) Take 2 each by mouth daily.        . NON FORMULARY Domperidone 20mg  four times daily       . omega-3 acid ethyl esters (LOVAZA) 1 G capsule Take 2 capsules (2 g total) by mouth 2 (two) times daily.  360 capsule  1  . ondansetron (ZOFRAN) 4 MG tablet Take 4 mg by mouth every 8 (eight) hours as needed.        . pregabalin (LYRICA) 75 MG capsule Take 75 mg by mouth 3 (three) times daily.        . prochlorperazine (COMPAZINE) 10 MG tablet As needed      . promethazine (PHENERGAN) 25 MG tablet Take 25 mg by mouth every 6 (six) hours as needed.        Marland Kitchen rOPINIRole (REQUIP) 0.5 MG tablet Take 0.5 mg by mouth. Take three at bedtime      . rosuvastatin (CRESTOR) 20 MG tablet Take 1 tablet (20 mg total) by mouth daily.  30 tablet  3  . sucralfate (CARAFATE) 1 GM/10ML suspension Take 1 g by mouth 4 (four) times daily -   with meals and at bedtime.        . SUMAtriptan (IMITREX) 50 MG tablet Take 1 tablet (50 mg total) by mouth every 2 (two) hours as needed for migraine. Max 200 mg/day  10 tablet  0  . topiramate (TOPAMAX) 50 MG tablet Take 50 mg by mouth daily.        Marland Kitchen triamterene-hydrochlorothiazide (DYAZIDE) 37.5-25 MG per capsule Take 1 capsule by mouth every morning.       . traMADol (ULTRAM) 50 MG tablet As needed       Allergies  Allergen Reactions  . Erythromycin Nausea Only  . Tramadol Itching   ROS:  Denies fevers, URI symptoms.  +nausea, occasional vomiting.  No edema, or other complaints  PHYSICAL EXAM: BP 124/76  Pulse 72  Ht 5' 2.5" (1.588 m)  Wt 203 lb (92.08 kg)  BMI 36.54 kg/m2 Pleasant, overweight female, actively scratching her scalp and arms. Scalp:  No lice, only rare dandruff flake.  Skin is entirely normal without any rashes, excoriations or other abnormalities, only slightly dry conjnctiva and sclera are clear, anicteric.  Skin without jaundice. Neck: no lymphadenopathy or thyromegaly  ASSESSMENT/PLAN: 1. Itching  hydrOXYzine (ATARAX) 50 MG tablet   Pruritis: Trial of atarax in place of Benadryl.  May use OTC HC, Sarna anti-itch  Lotion.  Moisturize adequately, stay hydrated.  If not improving, consider short course of prednisone/steroids. If ongoing problems with itching, may need further evaluation (including tests of liver and thyroid function).

## 2011-07-25 NOTE — Patient Instructions (Signed)
Instead of Benadryl, try the new prescription.  May be sedating, especially at higher doses.  Use only if needed for itching.  Keep yourself well hydrated, and use moisturizer lotion.  You may try Sarna anti-itch lotion (contains menthol--makes the skin feel cool and less itchy).  You may also, in limited areas of the skin, cortisone cream (1% hydrocortisone, such as Cort-Aid).  If you aren't getting any relief from these measures, consider a short course of prednisone/steroids.  If you are having ongoing problems with itching, may need further evaluation (including tests of liver and thyroid function).

## 2011-08-17 LAB — I-STAT 8, (EC8 V) (CONVERTED LAB)
Acid-Base Excess: 2
BUN: 7
Bicarbonate: 27.2 — ABNORMAL HIGH
Chloride: 110
Glucose, Bld: 86
HCT: 45
Hemoglobin: 15.3 — ABNORMAL HIGH
Operator id: 272551
Potassium: 3.6
Sodium: 143
TCO2: 28
pCO2, Ven: 44.8 — ABNORMAL LOW
pH, Ven: 7.39 — ABNORMAL HIGH

## 2011-08-17 LAB — CBC
HCT: 42
Hemoglobin: 14.1
MCHC: 33.6
MCV: 89.9
Platelets: 365
RBC: 4.67
RDW: 13.8
WBC: 10.8 — ABNORMAL HIGH

## 2011-08-17 LAB — DIFFERENTIAL
Basophils Absolute: 0.2 — ABNORMAL HIGH
Basophils Relative: 2 — ABNORMAL HIGH
Eosinophils Absolute: 0.1
Eosinophils Relative: 1
Lymphocytes Relative: 24
Lymphs Abs: 2.6
Monocytes Absolute: 0.9
Monocytes Relative: 8
Neutro Abs: 6.9
Neutrophils Relative %: 64

## 2011-08-17 LAB — POCT CARDIAC MARKERS
CKMB, poc: <1 — ABNORMAL LOW
Myoglobin, poc: 68.9
Operator id: 272551
Troponin i, poc: <0.05

## 2011-08-17 LAB — POCT I-STAT CREATININE
Creatinine, Ser: 0.9
Operator id: 272551

## 2011-08-24 ENCOUNTER — Ambulatory Visit: Payer: 59 | Admitting: Medical

## 2011-08-24 LAB — URINALYSIS, ROUTINE W REFLEX MICROSCOPIC
Bilirubin Urine: NEGATIVE
Bilirubin Urine: NEGATIVE
Glucose, UA: NEGATIVE
Glucose, UA: NEGATIVE
Hgb urine dipstick: NEGATIVE
Hgb urine dipstick: NEGATIVE
Ketones, ur: NEGATIVE
Ketones, ur: NEGATIVE
Nitrite: NEGATIVE
Nitrite: NEGATIVE
Protein, ur: NEGATIVE
Protein, ur: NEGATIVE
Specific Gravity, Urine: 1.026
Specific Gravity, Urine: 1.014
Urobilinogen, UA: 0.2
Urobilinogen, UA: 0.2
pH: 5
pH: 6

## 2011-08-24 LAB — DIFFERENTIAL
Basophils Absolute: 0.1
Basophils Absolute: 0
Basophils Relative: 1
Basophils Relative: 0
Eosinophils Absolute: 0.1
Eosinophils Absolute: 0.1
Eosinophils Relative: 1
Eosinophils Relative: 1
Lymphocytes Relative: 14
Lymphocytes Relative: 12
Lymphs Abs: 1.8
Lymphs Abs: 1.7
Monocytes Absolute: 0.8
Monocytes Absolute: 0.9
Monocytes Relative: 6
Monocytes Relative: 6
Neutro Abs: 10.4 — ABNORMAL HIGH
Neutro Abs: 11.4 — ABNORMAL HIGH
Neutrophils Relative %: 78 — ABNORMAL HIGH
Neutrophils Relative %: 81 — ABNORMAL HIGH

## 2011-08-24 LAB — CBC
HCT: 37
HCT: 42.1
HCT: 37.5
HCT: 39.3
HCT: 46.2 — ABNORMAL HIGH
Hemoglobin: 12.3
Hemoglobin: 14.1
Hemoglobin: 12.6
Hemoglobin: 13.7
Hemoglobin: 15.2 — ABNORMAL HIGH
MCHC: 33.2
MCHC: 33.5
MCHC: 33
MCHC: 33.6
MCHC: 34.8
MCV: 91.3
MCV: 91.4
MCV: 89.4
MCV: 90.5
MCV: 91.6
Platelets: 325
Platelets: 369
Platelets: 351
Platelets: 354
Platelets: 426 — ABNORMAL HIGH
RBC: 4.06
RBC: 4.62
RBC: 4.14
RBC: 4.4
RBC: 5.04
RDW: 14
RDW: 14.4
RDW: 14.2
RDW: 14.4
RDW: 14.5
WBC: 13.3 — ABNORMAL HIGH
WBC: 9.1
WBC: 11.1 — ABNORMAL HIGH
WBC: 11.8 — ABNORMAL HIGH
WBC: 14.1 — ABNORMAL HIGH

## 2011-08-24 LAB — COMPREHENSIVE METABOLIC PANEL
ALT: 27
ALT: 29
AST: 25
AST: 21
Albumin: 3.4 — ABNORMAL LOW
Albumin: 3.7
Alkaline Phosphatase: 89
Alkaline Phosphatase: 96
BUN: 11
BUN: 6
CO2: 25
CO2: 27
Calcium: 8.9
Calcium: 9.9
Chloride: 108
Chloride: 108
Creatinine, Ser: 0.94
Creatinine, Ser: 0.84
GFR calc Af Amer: >60
GFR calc Af Amer: >60
GFR calc non Af Amer: >60
GFR calc non Af Amer: >60
Glucose, Bld: 87
Glucose, Bld: 107 — ABNORMAL HIGH
Potassium: 3.6
Potassium: 3.6
Sodium: 139
Sodium: 143
Total Bilirubin: 0.6
Total Bilirubin: 0.6
Total Protein: 6.9
Total Protein: 7.8

## 2011-08-24 LAB — BASIC METABOLIC PANEL
BUN: 11
BUN: 4 — ABNORMAL LOW
CO2: 27
CO2: 25
Calcium: 8.2 — ABNORMAL LOW
Calcium: 8.1 — ABNORMAL LOW
Chloride: 109
Chloride: 108
Creatinine, Ser: 0.82
Creatinine, Ser: 0.79
GFR calc Af Amer: >60
GFR calc Af Amer: >60
GFR calc non Af Amer: >60
GFR calc non Af Amer: >60
Glucose, Bld: 79
Glucose, Bld: 90
Potassium: 3.5
Potassium: 3.4 — ABNORMAL LOW
Sodium: 143
Sodium: 137

## 2011-08-24 LAB — FECAL LACTOFERRIN, QUANT: Fecal Lactoferrin: NEGATIVE

## 2011-08-24 LAB — TSH: TSH: 3.44

## 2011-08-24 LAB — POCT PREGNANCY, URINE: Preg Test, Ur: NEGATIVE

## 2011-08-24 LAB — PREGNANCY, URINE: Preg Test, Ur: NEGATIVE

## 2011-08-24 LAB — POCT CARDIAC MARKERS
CKMB, poc: <1 — ABNORMAL LOW
Myoglobin, poc: 60.6
Troponin i, poc: <0.05

## 2011-08-24 LAB — CLOSTRIDIUM DIFFICILE EIA: C difficile Toxins A+B, EIA: NEGATIVE

## 2011-08-24 LAB — LIPASE, BLOOD
Lipase: 25
Lipase: 27

## 2011-08-27 LAB — COMPREHENSIVE METABOLIC PANEL
ALT: 36 — ABNORMAL HIGH
ALT: 50 — ABNORMAL HIGH
AST: 23
AST: 59 — ABNORMAL HIGH
Albumin: 2.8 — ABNORMAL LOW
Albumin: 3.1 — ABNORMAL LOW
Alkaline Phosphatase: 67
Alkaline Phosphatase: 83
BUN: 24 — ABNORMAL HIGH
BUN: 7
CO2: 20
CO2: 22
Calcium: 8.5
Calcium: 8.5
Chloride: 110
Chloride: 112
Creatinine, Ser: 0.81
Creatinine, Ser: 1.19
GFR calc Af Amer: 58 — ABNORMAL LOW
GFR calc Af Amer: >60
GFR calc non Af Amer: 48 — ABNORMAL LOW
GFR calc non Af Amer: >60
Glucose, Bld: 106 — ABNORMAL HIGH
Glucose, Bld: 146 — ABNORMAL HIGH
Potassium: 3.3 — ABNORMAL LOW
Potassium: 3.7
Sodium: 141
Sodium: 141
Total Bilirubin: 0.6
Total Bilirubin: 1.1
Total Protein: 5.7 — ABNORMAL LOW
Total Protein: 6.6

## 2011-08-27 LAB — CBC
HCT: 34.8 — ABNORMAL LOW
HCT: 37.9
HCT: 39.3
HCT: 46.7 — ABNORMAL HIGH
Hemoglobin: 11.9 — ABNORMAL LOW
Hemoglobin: 12.7
Hemoglobin: 13.1
Hemoglobin: 15.9 — ABNORMAL HIGH
MCHC: 33.3
MCHC: 33.5
MCHC: 34.2
MCHC: 34.2
MCV: 91.9
MCV: 92.3
MCV: 93
MCV: 93.2
Platelets: 320
Platelets: 338
Platelets: 349
Platelets: 401 — ABNORMAL HIGH
RBC: 3.73 — ABNORMAL LOW
RBC: 4.08
RBC: 4.26
RBC: 5.08
RDW: 15.2
RDW: 15.3
RDW: 15.5
RDW: 16 — ABNORMAL HIGH
WBC: 16.2 — ABNORMAL HIGH
WBC: 7.8
WBC: 8.4
WBC: 9.7

## 2011-08-27 LAB — CLOSTRIDIUM DIFFICILE EIA
C difficile Toxins A+B, EIA: NEGATIVE
C difficile Toxins A+B, EIA: NEGATIVE

## 2011-08-27 LAB — MAGNESIUM: Magnesium: 2.3

## 2011-08-27 LAB — DIFFERENTIAL
Basophils Absolute: 0
Basophils Absolute: 0
Basophils Relative: 0
Basophils Relative: 0
Eosinophils Absolute: 0
Eosinophils Absolute: 0.3
Eosinophils Relative: 0
Eosinophils Relative: 3
Lymphocytes Relative: 17
Lymphocytes Relative: 4 — ABNORMAL LOW
Lymphs Abs: 0.7
Lymphs Abs: 1.7
Monocytes Absolute: 0.6
Monocytes Absolute: 0.7
Monocytes Relative: 4
Monocytes Relative: 8
Neutro Abs: 14.8 — ABNORMAL HIGH
Neutro Abs: 7
Neutrophils Relative %: 72
Neutrophils Relative %: 92 — ABNORMAL HIGH

## 2011-08-27 LAB — BASIC METABOLIC PANEL
BUN: 14
CO2: 26
Calcium: 7.6 — ABNORMAL LOW
Chloride: 113 — ABNORMAL HIGH
Creatinine, Ser: 0.8
GFR calc Af Amer: >60
GFR calc non Af Amer: >60
Glucose, Bld: 104 — ABNORMAL HIGH
Potassium: 4.6
Sodium: 143

## 2011-08-27 LAB — STOOL CULTURE

## 2011-08-27 LAB — FECAL LACTOFERRIN, QUANT: Fecal Lactoferrin: NEGATIVE

## 2011-08-27 LAB — OVA AND PARASITE EXAMINATION: Ova and parasites: NONE SEEN

## 2011-08-27 LAB — LIPASE, BLOOD: Lipase: 28

## 2011-08-28 LAB — COMPREHENSIVE METABOLIC PANEL
ALT: 17
ALT: 20
AST: 20
AST: 23
Albumin: 2.9 — ABNORMAL LOW
Albumin: 3.2 — ABNORMAL LOW
Alkaline Phosphatase: 58
Alkaline Phosphatase: 77
BUN: 13
BUN: 4 — ABNORMAL LOW
CO2: 17 — ABNORMAL LOW
CO2: 23
Calcium: 8.2 — ABNORMAL LOW
Calcium: 9.1
Chloride: 110
Chloride: 111
Creatinine, Ser: 0.91
Creatinine, Ser: 0.95
GFR calc Af Amer: >60
GFR calc Af Amer: >60
GFR calc non Af Amer: >60
GFR calc non Af Amer: >60
Glucose, Bld: 115 — ABNORMAL HIGH
Glucose, Bld: 127 — ABNORMAL HIGH
Potassium: 3.1 — ABNORMAL LOW
Potassium: 3.5
Sodium: 140
Sodium: 144
Total Bilirubin: 0.6
Total Bilirubin: 1
Total Protein: 6.3
Total Protein: 6.8

## 2011-08-28 LAB — BASIC METABOLIC PANEL
BUN: 1 — ABNORMAL LOW
BUN: 2 — ABNORMAL LOW
BUN: 7
BUN: <1 — ABNORMAL LOW
CO2: 16 — ABNORMAL LOW
CO2: 22
CO2: 22
CO2: 24
Calcium: 7.6 — ABNORMAL LOW
Calcium: 8.2 — ABNORMAL LOW
Calcium: 8.3 — ABNORMAL LOW
Calcium: 9.2
Chloride: 111
Chloride: 111
Chloride: 112
Chloride: 112
Creatinine, Ser: 0.65
Creatinine, Ser: 0.65
Creatinine, Ser: 0.72
Creatinine, Ser: 0.92
GFR calc Af Amer: >60
GFR calc Af Amer: >60
GFR calc Af Amer: >60
GFR calc Af Amer: >60
GFR calc non Af Amer: >60
GFR calc non Af Amer: >60
GFR calc non Af Amer: >60
GFR calc non Af Amer: >60
Glucose, Bld: 109 — ABNORMAL HIGH
Glucose, Bld: 119 — ABNORMAL HIGH
Glucose, Bld: 122 — ABNORMAL HIGH
Glucose, Bld: 126 — ABNORMAL HIGH
Potassium: 2.9 — ABNORMAL LOW
Potassium: 3.1 — ABNORMAL LOW
Potassium: 3.3 — ABNORMAL LOW
Potassium: 3.6
Sodium: 140
Sodium: 141
Sodium: 141
Sodium: 142

## 2011-08-28 LAB — URINALYSIS, ROUTINE W REFLEX MICROSCOPIC
Bilirubin Urine: NEGATIVE
Glucose, UA: NEGATIVE
Glucose, UA: NEGATIVE
Hgb urine dipstick: NEGATIVE
Hgb urine dipstick: NEGATIVE
Ketones, ur: >80 — AB
Ketones, ur: NEGATIVE
Nitrite: NEGATIVE
Nitrite: NEGATIVE
Protein, ur: NEGATIVE
Protein, ur: NEGATIVE
Specific Gravity, Urine: 1.013
Specific Gravity, Urine: 1.027
Urobilinogen, UA: 0.2
Urobilinogen, UA: 0.2
pH: 5.5
pH: 7

## 2011-08-28 LAB — BLOOD GAS, VENOUS
Acid-Base Excess: 2
Bicarbonate: 23.3
O2 Saturation: 97.4
Patient temperature: 98.6
TCO2: 18.6
pCO2, Ven: 29.6 — ABNORMAL LOW
pH, Ven: 7.508 — ABNORMAL HIGH
pO2, Ven: 83.1 — ABNORMAL HIGH

## 2011-08-28 LAB — DIFFERENTIAL
Basophils Absolute: 0
Basophils Absolute: 0
Basophils Absolute: 0
Basophils Absolute: 0
Basophils Absolute: 0.1
Basophils Relative: 0
Basophils Relative: 0
Basophils Relative: 0
Basophils Relative: 0
Basophils Relative: 1
Eosinophils Absolute: 0
Eosinophils Absolute: 0
Eosinophils Absolute: 0.1
Eosinophils Absolute: 0.2
Eosinophils Absolute: 0.2
Eosinophils Relative: 0
Eosinophils Relative: 0
Eosinophils Relative: 1
Eosinophils Relative: 1
Eosinophils Relative: 2
Lymphocytes Relative: 16
Lymphocytes Relative: 16
Lymphocytes Relative: 18
Lymphocytes Relative: 19
Lymphocytes Relative: 19
Lymphs Abs: 1.6
Lymphs Abs: 1.7
Lymphs Abs: 2.1
Lymphs Abs: 3.3
Lymphs Abs: 3.5
Monocytes Absolute: 0.8
Monocytes Absolute: 0.8
Monocytes Absolute: 0.9
Monocytes Absolute: 1.1 — ABNORMAL HIGH
Monocytes Absolute: 1.2 — ABNORMAL HIGH
Monocytes Relative: 5
Monocytes Relative: 7
Monocytes Relative: 7
Monocytes Relative: 9
Monocytes Relative: 9
Neutro Abs: 12.9 — ABNORMAL HIGH
Neutro Abs: 17.3 — ABNORMAL HIGH
Neutro Abs: 7
Neutro Abs: 7.5
Neutro Abs: 8.1 — ABNORMAL HIGH
Neutrophils Relative %: 71
Neutrophils Relative %: 72
Neutrophils Relative %: 74
Neutrophils Relative %: 74
Neutrophils Relative %: 79 — ABNORMAL HIGH

## 2011-08-28 LAB — CBC
HCT: 35.2 — ABNORMAL LOW
HCT: 35.3 — ABNORMAL LOW
HCT: 36.4
HCT: 36.6
HCT: 38.2
HCT: 41.3
HCT: 46.7 — ABNORMAL HIGH
Hemoglobin: 12
Hemoglobin: 12.2
Hemoglobin: 12.4
Hemoglobin: 12.5
Hemoglobin: 12.9
Hemoglobin: 14.1
Hemoglobin: 15.6 — ABNORMAL HIGH
MCHC: 33.4
MCHC: 33.7
MCHC: 33.8
MCHC: 33.9
MCHC: 34.1
MCHC: 34.3
MCHC: 34.6
MCV: 92
MCV: 92.1
MCV: 92.6
MCV: 92.8
MCV: 93.3
MCV: 93.4
MCV: 93.9
Platelets: 308
Platelets: 312
Platelets: 324
Platelets: 373
Platelets: 401 — ABNORMAL HIGH
Platelets: 481 — ABNORMAL HIGH
RBC: 3.77 — ABNORMAL LOW
RBC: 3.77 — ABNORMAL LOW
RBC: 3.9
RBC: 3.92
RBC: 4.12
RBC: 4.48
RBC: 5.08
RDW: 14.2
RDW: 14.5
RDW: 14.5
RDW: 14.6
RDW: 14.8
RDW: 14.9
RDW: 15.1
WBC: 10.1
WBC: 10.1
WBC: 12.3 — ABNORMAL HIGH
WBC: 15.8 — ABNORMAL HIGH
WBC: 17.5 — ABNORMAL HIGH
WBC: 21.9 — ABNORMAL HIGH
WBC: 9.8

## 2011-08-28 LAB — EHEC TOXIN BY EIA, STOOL: EHEC Toxin by EIA: NEGATIVE

## 2011-08-28 LAB — OVA AND PARASITE EXAMINATION: Ova and parasites: NONE SEEN

## 2011-08-28 LAB — URINE MICROSCOPIC-ADD ON

## 2011-08-28 LAB — CLOSTRIDIUM DIFFICILE EIA

## 2011-08-28 LAB — MAGNESIUM: Magnesium: 2

## 2011-08-28 LAB — POCT PREGNANCY, URINE: Preg Test, Ur: NEGATIVE

## 2011-08-28 LAB — LACTIC ACID, PLASMA: Lactic Acid, Venous: 1.2

## 2011-08-29 LAB — DIFFERENTIAL
Basophils Absolute: 0.1
Basophils Relative: 1
Eosinophils Absolute: 0
Eosinophils Relative: 0
Lymphocytes Relative: 9 — ABNORMAL LOW
Lymphs Abs: 1.7
Monocytes Absolute: 0.8
Monocytes Relative: 4
Neutro Abs: 17.2 — ABNORMAL HIGH
Neutrophils Relative %: 87 — ABNORMAL HIGH

## 2011-08-29 LAB — CBC
HCT: 43.6
Hemoglobin: 14.6
MCHC: 33.5
MCV: 91.6
Platelets: 445 — ABNORMAL HIGH
RBC: 4.76
RDW: 15.1
WBC: 19.7 — ABNORMAL HIGH

## 2011-08-29 LAB — BASIC METABOLIC PANEL
BUN: 12
CO2: 23
Calcium: 8.7
Chloride: 109
Creatinine, Ser: 0.9
GFR calc Af Amer: >60
GFR calc non Af Amer: >60
Glucose, Bld: 115 — ABNORMAL HIGH
Potassium: 3.2 — ABNORMAL LOW
Sodium: 142

## 2011-08-29 LAB — HEPATIC FUNCTION PANEL
ALT: 20
AST: 24
Albumin: 3.2 — ABNORMAL LOW
Alkaline Phosphatase: 96
Bilirubin, Direct: <0.1
Total Bilirubin: 0.6
Total Protein: 6.9

## 2011-08-29 LAB — URINALYSIS, ROUTINE W REFLEX MICROSCOPIC
Bilirubin Urine: NEGATIVE
Glucose, UA: NEGATIVE
Hgb urine dipstick: NEGATIVE
Ketones, ur: NEGATIVE
Nitrite: NEGATIVE
Protein, ur: NEGATIVE
Specific Gravity, Urine: 1.028
Urobilinogen, UA: 0.2
pH: 5

## 2011-08-29 LAB — LIPASE, BLOOD: Lipase: 23

## 2011-09-12 ENCOUNTER — Other Ambulatory Visit: Payer: 59

## 2011-09-12 DIAGNOSIS — E785 Hyperlipidemia, unspecified: Secondary | ICD-10-CM

## 2011-09-12 LAB — LIPID PANEL
Cholesterol: 126 mg/dL (ref 0–200)
HDL: 44 mg/dL (ref >39)
LDL Cholesterol: 57 mg/dL (ref 0–99)
Total CHOL/HDL Ratio: 2.9 Ratio
Triglycerides: 125 mg/dL (ref <150)
VLDL: 25 mg/dL (ref 0–40)

## 2011-09-17 ENCOUNTER — Encounter: Payer: Self-pay | Admitting: Family Medicine

## 2011-09-17 ENCOUNTER — Ambulatory Visit (INDEPENDENT_AMBULATORY_CARE_PROVIDER_SITE_OTHER): Payer: 59 | Admitting: Family Medicine

## 2011-09-17 VITALS — BP 122/74 | HR 68 | Ht 62.5 in | Wt 212.0 lb

## 2011-09-17 DIAGNOSIS — J309 Allergic rhinitis, unspecified: Secondary | ICD-10-CM

## 2011-09-17 DIAGNOSIS — J189 Pneumonia, unspecified organism: Secondary | ICD-10-CM

## 2011-09-17 DIAGNOSIS — L299 Pruritus, unspecified: Secondary | ICD-10-CM

## 2011-09-17 DIAGNOSIS — E7800 Pure hypercholesterolemia, unspecified: Secondary | ICD-10-CM

## 2011-09-17 DIAGNOSIS — D649 Anemia, unspecified: Secondary | ICD-10-CM

## 2011-09-17 DIAGNOSIS — R609 Edema, unspecified: Secondary | ICD-10-CM

## 2011-09-17 DIAGNOSIS — E663 Overweight: Secondary | ICD-10-CM

## 2011-09-17 DIAGNOSIS — K3184 Gastroparesis: Secondary | ICD-10-CM

## 2011-09-17 DIAGNOSIS — Z23 Encounter for immunization: Secondary | ICD-10-CM

## 2011-09-17 MED ORDER — FLUTICASONE PROPIONATE 50 MCG/ACT NA SUSP: 2.0000 | Freq: Every day | NASAL | Status: DC

## 2011-09-17 MED ORDER — HYDROXYZINE HCL 50 MG PO TABS: ORAL_TABLET | ORAL | Status: DC

## 2011-09-17 NOTE — Patient Instructions (Signed)
Cholesterol is looking great--continue the current regimen of Crestor and Lovaza.  Weight loss is recommended. Try and get at least 30 minutes of exercise daily

## 2011-09-17 NOTE — Progress Notes (Signed)
Pneumonia follow-up:  She had pneumonia about 3 weeks ago.  She was at [facility] 3 weeks ago for gastroparesis follow-up.  She had fallen prior and injured her ribs.  A chest x-ray was done which showed a mild pneumonia.  She never had fevers, cough or shortness of breath, just the rib pain.  She was treated with Avelox for 10 days, and was told to follow up with PCP in 2-3 weeks.  Rib pain is improving, and denies any respiratory complaints.  Denies shortness of breath.  She is also here for med check.  Had labs done last week (lipids).  Also had labs done at [facility] recently. Lab Results  Component Value Date   CHOL 126 09/12/2011   HDL 44 09/12/2011   LDLCALC 57 09/12/2011   TRIG 125 09/12/2011   CHOLHDL 2.9 09/12/2011   Edema--well controlled with Dyazide.  She has no history of hypertension. Gastroparesis--at least one day a week, she stays in bed due to nausea.  Had flare of vomiting prior to her last visit with [facility], but she had been off of 2 of her meds (Domperidone and Nexium) for a week for a gastric emptying study.  Hass 3 different nausea medications, doesn't use them at the same time.  There have been times where compazine seemed more effective than either Phenergan or Zofran.  Uses Compazine only if others fail, less frequently, as can contribute to tremors.  Can no longer follow up with the nutritionist--because of billing, was counted as outpatient hospital treatment and she has very large deductible and was too expensive ($125 per visit)  Past Medical History  Diagnosis Date  . Fibromyalgia   . GERD (gastroesophageal reflux disease)   . Obstructive sleep apnea     On Cpap 2009  . Anxiety   . Depression   . Restless leg syndrome   . Edema   . Migraine   . Hyperlipidemia   . Anemia     previously followed by Dr. Dalene Carrow for anemia and elevated platelets  . Fibroids   . Paresthesia     Dr. Antonietta Barcelona at [facility]  . Tremor     Dr. Antonietta Barcelona  . Gastroparesis    followed at [facility]  . Pneumonia     2012  . Plantar fasciitis 02/2011    R foot    Past Surgical History  Procedure Date  . Tonsillectomy 1968  . Knee surgery 1999    R knee, Dr. Renae Fickle, torn cartilage  . Cholecystectomy 9/05  . Endometrial ablation 10/07    Dr. Audie Box    History   Social History  . Marital Status: Married    Spouse Name: N/A    Number of Children: 2  . Years of Education: N/A   Occupational History  . customer service (on disability) [employer]   Social History Main Topics  . Smoking status: Never Smoker   . Smokeless tobacco: Never Used  . Alcohol Use: No  . Drug Use: No  . Sexually Active: Not on file   Other Topics Concern  . Not on file   Social History Narrative  . No narrative on file    Family History  Problem Relation Age of Onset  . Heart disease Mother     pacemaker  . Heart disease Father   . Heart disease Paternal Grandmother   . Heart disease Paternal Grandfather   . Asthma Sister   . Irritable bowel syndrome Sister   . Allergies Mother   .  Allergies Sister   . Allergies Sister     Current outpatient prescriptions:Armodafinil (NUVIGIL) 250 MG tablet, Take 250 mg by mouth daily.  , Disp: , Rfl: ;  Cetirizine HCl 10 MG CAPS, Take 10 mg by mouth daily.  , Disp: , Rfl: ;  Cholecalciferol (VITAMIN D) 2000 UNITS tablet, Take 2,000 Units by mouth daily.  , Disp: , Rfl: ;  dexlansoprazole (DEXILANT) 60 MG capsule, Take 60 mg by mouth daily.  , Disp: , Rfl:  dronabinol (MARINOL) 5 MG capsule, Take 5 mg by mouth 2 (two) times daily before a meal.  , Disp: , Rfl: ;  DULoxetine (CYMBALTA) 30 MG capsule, Take 30 mg by mouth daily. 3 tablets orally once daily , Disp: , Rfl: ;  Eszopiclone 3 MG TABS, Take 3 mg by mouth at bedtime. Take immediately before bedtime, Disp: , Rfl: ;  fluticasone (FLONASE) 50 MCG/ACT nasal spray, Place 2 sprays into the nose daily., Disp: 16 g, Rfl: 5 hydrOXYzine (ATARAX/VISTARIL) 50 MG tablet, You may use  anywhere from 1/2 tablet, up to 2 tablets, every 6 hours as needed for itching.  May be more sedating at the higher doses, Disp: 30 tablet, Rfl: 1;  LORazepam (ATIVAN) 0.5 MG tablet, Take 0.5 mg by mouth every 4 (four) hours as needed.  , Disp: , Rfl: ;  Multiple Vitamin (MULTIVITAMIN PO), Take 2 each by mouth daily.  , Disp: , Rfl:  NON FORMULARY, Domperidone 20mg  four times daily , Disp: , Rfl: ;  NON FORMULARY, Place 1 application onto the skin 2 (two) times daily. Derma tran cream. , Disp: , Rfl: ;  omega-3 acid ethyl esters (LOVAZA) 1 G capsule, Take 2 capsules (2 g total) by mouth 2 (two) times daily., Disp: 360 capsule, Rfl: 1;  ondansetron (ZOFRAN) 4 MG tablet, Take 4 mg by mouth every 8 (eight) hours as needed.  , Disp: , Rfl:  pregabalin (LYRICA) 75 MG capsule, Take 75 mg by mouth 4 (four) times daily. , Disp: , Rfl: ;  prochlorperazine (COMPAZINE) 10 MG tablet, As needed, Disp: , Rfl: ;  promethazine (PHENERGAN) 25 MG tablet, Take 25 mg by mouth every 6 (six) hours as needed.  , Disp: , Rfl: ;  rOPINIRole (REQUIP) 0.5 MG tablet, Take 0.5 mg by mouth. Take three at bedtime, Disp: , Rfl:  rosuvastatin (CRESTOR) 20 MG tablet, Take 1 tablet (20 mg total) by mouth daily., Disp: 30 tablet, Rfl: 3;  sucralfate (CARAFATE) 1 GM/10ML suspension, Take 1 g by mouth 4 (four) times daily -  with meals and at bedtime.  , Disp: , Rfl: ;  SUMAtriptan (IMITREX) 50 MG tablet, Take 1 tablet (50 mg total) by mouth every 2 (two) hours as needed for migraine. Max 200 mg/day, Disp: 10 tablet, Rfl: 0 topiramate (TOPAMAX) 50 MG tablet, Take 50 mg by mouth daily.  , Disp: , Rfl: ;  traMADol (ULTRAM) 50 MG tablet, As needed, Disp: , Rfl: ;  triamterene-hydrochlorothiazide (DYAZIDE) 37.5-25 MG per capsule, Take 1 capsule by mouth every morning. , Disp: , Rfl: ;  albuterol (VENTOLIN HFA) 108 (90 BASE) MCG/ACT inhaler, Inhale 2 puffs into the lungs every 4 (four) hours as needed.  , Disp: , Rfl:  hydrocortisone (ANUSOL-HC) 2.5 %  rectal cream, Apply to external hemorrhoids three times daily as needed for inflamed hemorrhoids, Disp: 30 g, Rfl: 1  Allergies  Allergen Reactions  . Erythromycin Nausea Only  . Tramadol Itching   ROS:  Denies headache.  Had  some migraines recently that Imitrex relieved.  Denies chest pains (just R rib pain).  Denies shortness of breath, palpitations.  Edema is controlled.  Denies URI symptom or allergies. No fevers, rashes, or other concerns. See HPI.  PHYSICAL EXAM: Well developed, pleasant female in no distress BP 122/74  Pulse 68  Ht 5' 2.5" (1.588 m)  Wt 212 lb (96.163 kg)  BMI 38.16 kg/m2 HEENT: PERRL, EOMI, conjunctiva clear.  Mucous membranes moist. Neck: no lymphadenopathy or thyromegaly Heart: regular rate and rhythm Lungs clear bilaterally Mild tenderness R anterolateral floating ribs Abdomen nontender, no organomegaly or mass Extremities: no edema Psych: normal mood, affect, hygiene and grooming  ASSESSMENT/PLAN:  1. Pneumonia  DG Chest 2 View   clinically resolved.  Order put in computer for f/u CXR  2. Need for prophylactic vaccination and inoculation against influenza  Flu vaccine greater than or equal to 3yo preservative free IM  3. Pure hypercholesterolemia    4. Edema    5. Itching  hydrOXYzine (ATARAX/VISTARIL) 50 MG tablet  6. Allergic rhinitis, cause unspecified  fluticasone (FLONASE) 50 MCG/ACT nasal spray  7. Gastroparesis     stable overall, per WF  8. Anemia, unspecified     last checked in June.  No longer seeing hematologist.  Awaiting records from [facility] to see if CBC recently checked  9. Overweight     no longer seeing nutritionist due to expense.  Dietary limitations from gastroparesis.  Discussed need for exercise, portion control to help with weight loss   Recent pneumonia--f/u CXR ordered Hyperlipidemia--well controlled Edema--controlled  ROR--get labs drawn at [facility] earlier this month  F/u 6 months, sooner prn.  If CBC not drawn at  [facility], would like one by December

## 2011-09-18 DIAGNOSIS — J309 Allergic rhinitis, unspecified: Secondary | ICD-10-CM | POA: Insufficient documentation

## 2011-10-02 ENCOUNTER — Ambulatory Visit
Admission: RE | Admit: 2011-10-02 | Discharge: 2011-10-02 | Disposition: A | Discharge status: Home / Self Care | Payer: 59 | Source: Ambulatory Visit | Attending: Family Medicine | Admitting: Family Medicine

## 2011-10-02 DIAGNOSIS — J189 Pneumonia, unspecified organism: Secondary | ICD-10-CM

## 2011-10-16 ENCOUNTER — Other Ambulatory Visit: Payer: Self-pay | Admitting: Family Medicine

## 2011-10-16 NOTE — Telephone Encounter (Signed)
CAN WE REFILL THE IMITREX. CLS

## 2011-10-25 ENCOUNTER — Ambulatory Visit (INDEPENDENT_AMBULATORY_CARE_PROVIDER_SITE_OTHER): Payer: 59 | Admitting: Family Medicine

## 2011-10-25 ENCOUNTER — Encounter: Payer: Self-pay | Admitting: Family Medicine

## 2011-10-25 VITALS — BP 108/68 | HR 72 | Ht 62.5 in | Wt 206.0 lb

## 2011-10-25 DIAGNOSIS — R29818 Other symptoms and signs involving the nervous system: Secondary | ICD-10-CM

## 2011-10-25 DIAGNOSIS — J069 Acute upper respiratory infection, unspecified: Secondary | ICD-10-CM

## 2011-10-25 DIAGNOSIS — R2689 Other abnormalities of gait and mobility: Secondary | ICD-10-CM

## 2011-10-25 DIAGNOSIS — G2581 Restless legs syndrome: Secondary | ICD-10-CM

## 2011-10-25 MED ORDER — ROPINIROLE HCL 0.5 MG PO TABS: ORAL_TABLET | ORAL | Status: DC

## 2011-10-25 NOTE — Progress Notes (Signed)
Patient has been having some balance issues occasionally, over the last few days, mainly since being on Tegretol.  Felt off balance last night, when standing still, wasn't able to catch herself.  Didn't hit her head, but hit her knees, R hand and elbows. Some bruising of her right hand.  Feels sore all over.  Her fibromyalgia has been flaring, and had taken tramadol earlier in the day.  Denies any light headedness, just feeling off balance. She looked up and saw that there was an interaction between tramadol and tegretol, so didn't take either since (was due to take it last night).    Tegretol was started by Dr. Darlen Round about a week ago for burning in her feet.  She was started on 1 tablet for a week, then increased to 2 tablets.  Balance problems seemed to start when dose was increased.  Complaining of pain in R ear since last night.  Having some R sided sinus pain in her head, which began shortly after her fall.  Again, denies any head trauma.  She is also complaining of being more tremulous than usual--usually has tremor just in right hand, but now is in both.  Feeling very anxious today.  Took lorazepam, but it didn't seem to help.  A family member mentioned something about the amount of medication she takes, and she was wondering if it was okay.  Past Medical History  Diagnosis Date  . Fibromyalgia   . GERD (gastroesophageal reflux disease)   . Obstructive sleep apnea     On Cpap 2009  . Anxiety   . Depression   . Restless leg syndrome   . Edema   . Migraine   . Hyperlipidemia   . Anemia     previously followed by Dr. Dalene Carrow for anemia and elevated platelets  . Fibroids   . Paresthesia     Dr. Antonietta Barcelona at [place]  . Tremor     Dr. Antonietta Barcelona  . Gastroparesis     followed at [facility]  . Pneumonia     2012  . Plantar fasciitis 02/2011    R foot    Past Surgical History  Procedure Date  . Tonsillectomy 1968  . Knee surgery 1999    R knee, Dr. Renae Fickle, torn cartilage  .  Cholecystectomy 9/05  . Endometrial ablation 10/07    Dr. Audie Box    History   Social History  . Marital Status: Married    Spouse Name: N/A    Number of Children: 2  . Years of Education: N/A   Occupational History  . customer service (on disability) [employer]   Social History Main Topics  . Smoking status: Never Smoker   . Smokeless tobacco: Never Used  . Alcohol Use: No  . Drug Use: No  . Sexually Active: Not on file   Other Topics Concern  . Not on file   Social History Narrative  . No narrative on file    Family History  Problem Relation Age of Onset  . Heart disease Mother     pacemaker  . Heart disease Father   . Heart disease Paternal Grandmother   . Heart disease Paternal Grandfather   . Asthma Sister   . Irritable bowel syndrome Sister   . Allergies Mother   . Allergies Sister   . Allergies Sister     Current outpatient prescriptions:Armodafinil (NUVIGIL) 250 MG tablet, Take 250 mg by mouth daily.  , Disp: , Rfl: ;  carbamazepine (TEGRETOL  XR) 100 MG 12 hr tablet, Take 200 mg by mouth daily.  , Disp: , Rfl: ;  Cetirizine HCl 10 MG CAPS, Take 10 mg by mouth daily.  , Disp: , Rfl: ;  Cholecalciferol (VITAMIN D) 2000 UNITS tablet, Take 2,000 Units by mouth daily.  , Disp: , Rfl:  dexlansoprazole (DEXILANT) 60 MG capsule, Take 60 mg by mouth daily.  , Disp: , Rfl: ;  dronabinol (MARINOL) 5 MG capsule, Take 5 mg by mouth 2 (two) times daily before a meal.  , Disp: , Rfl: ;  DULoxetine (CYMBALTA) 30 MG capsule, Take 30 mg by mouth daily. 3 tablets orally once daily , Disp: , Rfl: ;  Eszopiclone 3 MG TABS, Take 3 mg by mouth at bedtime. Take immediately before bedtime, Disp: , Rfl:  fluticasone (FLONASE) 50 MCG/ACT nasal spray, Place 2 sprays into the nose daily., Disp: 16 g, Rfl: 5;  hydrocortisone (ANUSOL-HC) 2.5 % rectal cream, Apply to external hemorrhoids three times daily as needed for inflamed hemorrhoids, Disp: 30 g, Rfl: 1 hydrOXYzine (ATARAX/VISTARIL)  50 MG tablet, You may use anywhere from 1/2 tablet, up to 2 tablets, every 6 hours as needed for itching.  May be more sedating at the higher doses, Disp: 30 tablet, Rfl: 1;  LORazepam (ATIVAN) 0.5 MG tablet, Take 0.5 mg by mouth every 4 (four) hours as needed.  , Disp: , Rfl: ;  Multiple Vitamin (MULTIVITAMIN PO), Take 2 each by mouth daily.  , Disp: , Rfl:  NON FORMULARY, Domperidone 20mg  four times daily , Disp: , Rfl: ;  NON FORMULARY, Place 1 application onto the skin 2 (two) times daily. Derma tran cream. , Disp: , Rfl: ;  omega-3 acid ethyl esters (LOVAZA) 1 G capsule, Take 2 capsules (2 g total) by mouth 2 (two) times daily., Disp: 360 capsule, Rfl: 1;  ondansetron (ZOFRAN) 4 MG tablet, Take 4 mg by mouth every 8 (eight) hours as needed.  , Disp: , Rfl:  pregabalin (LYRICA) 75 MG capsule, Take 150 mg by mouth 2 (two) times daily. , Disp: , Rfl: ;  prochlorperazine (COMPAZINE) 10 MG tablet, As needed, Disp: , Rfl: ;  promethazine (PHENERGAN) 25 MG tablet, Take 25 mg by mouth every 6 (six) hours as needed.  , Disp: , Rfl: ;  rOPINIRole (REQUIP) 0.5 MG tablet, Take three tablets orally at bedtime, Disp: 90 tablet, Rfl: 5 rosuvastatin (CRESTOR) 20 MG tablet, Take 1 tablet (20 mg total) by mouth daily., Disp: 30 tablet, Rfl: 3;  SUMAtriptan (IMITREX) 50 MG tablet, TAKE 1 TABLET (50 MG TOTAL) BY MOUTH EVERY 2 (TWO) HOURS AS NEEDED FOR MIGRAINE. MAX 200 MG/DAY, Disp: 10 tablet, Rfl: 0;  topiramate (TOPAMAX) 50 MG tablet, Take 50 mg by mouth daily.  , Disp: , Rfl: ;  traMADol (ULTRAM) 50 MG tablet, As needed, Disp: , Rfl:  triamterene-hydrochlorothiazide (DYAZIDE) 37.5-25 MG per capsule, Take 1 capsule by mouth every morning. , Disp: , Rfl: ;  DISCONTD: rOPINIRole (REQUIP) 0.5 MG tablet, Take 0.5 mg by mouth. Take three at bedtime, Disp: , Rfl: ;  albuterol (VENTOLIN HFA) 108 (90 BASE) MCG/ACT inhaler, Inhale 2 puffs into the lungs every 4 (four) hours as needed.  , Disp: , Rfl:  sucralfate (CARAFATE) 1 GM/10ML  suspension, Take 1 g by mouth 4 (four) times daily -  with meals and at bedtime.  , Disp: , Rfl:   Allergies  Allergen Reactions  . Erythromycin Nausea Only  . Tramadol Itching   ROS:  Denies cough, shortness of breath (has some with exertion, easily gets hot).  Denies chest pain, rashes.  Nausea and vomiting is stable, some good days and bad days.  See HPI  PHYSICAL EXAM: BP 108/68  Pulse 72  Ht 5' 2.5" (1.588 m)  Wt 206 lb (93.441 kg)  BMI 37.08 kg/m2 Pleasant, overweight female, appearing tired, and somewhat older than stated age, in no distress HEENT:  PERRL, EOMI, conjunctiva clear.  Nose--mild edema.  Sinuses nontender. TM's and EAC's normal, OP normal Neck: no lymphadenopathy or thyromegaly Heart: regular rate and rhythm Lungs: clear bilaterally Neuro: alert and oriented.  Cranial nerves 2-12 intact. Normal finger to nose. Normal strength, sensation, DTR's. Mild, symmetric tremor, only noticed when holding hands out, not at rest  ASSESSMENT/PLAN 1. Balance problem    2. URI (upper respiratory infection)     R sided sinus pain, h/o allergies.  No evidence of bacterial infection  3. Restless leg syndrome  rOPINIRole (REQUIP) 0.5 MG tablet    Dizziness/off balance--most likely from tegretol (and interaction with ultram--avoid ultram)--if ongoing balance problems, she should discuss with her neurologist, consider decreasing dose (or ? Need to continue if not helping)  R facial pain.  No evidence of sinus infection.  Continue Zyrtec and Flonase.   Try sinus rinses (or Netipot) Use tylenol if needed for pain.  Avoid Ultram  RLS--Requip seems to work well for RLS, and needs refill  With respect to her concern over her medications--she has most of her medications prescribed by her multitude of specialists.  Discussed the need to address her med list with all of her doctors.  If a med isn't working, should one be stopped, rather than just adding another. Could there be interactions  between the meds one doctor is prescribing and a medication prescribed by another doctor, etc.  F/u prn persistent symptoms or problems

## 2011-10-25 NOTE — Patient Instructions (Signed)
Dizziness/off balance--most likely from tegretol (and interaction with ultram--avoid ultram), possibly related to upper respiratory illness (ear/sinus problems) R facial pain.  No evidence sinus infection.  Continue Zyrtec and Flonase.   Try sinus rinses (or Netipot) Use tylenol if needed for pain.  Avoid Ultram

## 2011-10-31 ENCOUNTER — Encounter: Payer: Self-pay | Admitting: Gynecology

## 2011-10-31 ENCOUNTER — Ambulatory Visit (INDEPENDENT_AMBULATORY_CARE_PROVIDER_SITE_OTHER): Payer: 59 | Admitting: Gynecology

## 2011-10-31 VITALS — BP 130/70 | Ht 62.5 in | Wt 208.0 lb

## 2011-10-31 DIAGNOSIS — E785 Hyperlipidemia, unspecified: Secondary | ICD-10-CM | POA: Insufficient documentation

## 2011-10-31 DIAGNOSIS — N951 Menopausal and female climacteric states: Secondary | ICD-10-CM

## 2011-10-31 DIAGNOSIS — M797 Fibromyalgia: Secondary | ICD-10-CM | POA: Insufficient documentation

## 2011-10-31 DIAGNOSIS — Z9889 Other specified postprocedural states: Secondary | ICD-10-CM | POA: Insufficient documentation

## 2011-10-31 DIAGNOSIS — R609 Edema, unspecified: Secondary | ICD-10-CM | POA: Insufficient documentation

## 2011-10-31 DIAGNOSIS — E782 Mixed hyperlipidemia: Secondary | ICD-10-CM | POA: Insufficient documentation

## 2011-10-31 NOTE — Progress Notes (Signed)
Patient presents complaining of several months of hot flushes and night sweats. She is status post endometrial ablation 2007 and has been amenorrheic since then. She normally sees Dr. Uvaldo Rising for routine healthcare and had a normal GYN exam earlier this year with a negative Pap smear. I have not seen her for 2+ years. She's not having any skin changes hair changes, weight changes or other global symtoms.  Exam HEENT normal Lungs clear Cardiac regular rate no rubs murmurs or gallops Abdomen soft nontender without masses guarding rebound organomegaly Pelvic deferred  Assessment and plan: Perimenopausal symptoms. We'll check baseline TSH, FSH. Options for management were reviewed to include observation, OTC soy based, pharmacologic nonhormonal such as Effexor and HRT. I discussed issues of HRT, risks benefits, WHI study to include stroke, heart attack, DVT as well as increased risk of breast cancer. ACOG and NAMS statements for lowest dose for shortness period time were reviewed. The need for progesterone along with estrogen to protect her uterine lining was also discussed and issues of endometrial cancer reviewed. The issues of transdermal versus oral with first pass effect benefits were also discussed. Patient will  think of her options. She'll follow up for her lab results and then we'll further discuss. If she's interested in HRT, then we will use Vivelle-Dot and Prometrium at bedtime and how to use this was discussed with her. I did not do a pelvic on her today as she had a normal exam within the past year but I did ask her to return in several months after initiating HRT, if indeed this is what we do, to allow me to do a GYN exam as I would be prescribing HRT to her.

## 2011-10-31 NOTE — Patient Instructions (Signed)
Follow up for hormone studies.

## 2011-11-08 ENCOUNTER — Telehealth: Payer: Self-pay

## 2011-11-08 MED ORDER — ESTRADIOL 0.05 MG/24HR TD PTTW: 1.0000 | MEDICATED_PATCH | TRANSDERMAL | Status: DC

## 2011-11-08 MED ORDER — PROGESTERONE MICRONIZED 100 MG PO CAPS: 100.0000 mg | ORAL_CAPSULE | Freq: Every day | ORAL | Status: DC

## 2011-11-08 NOTE — Telephone Encounter (Signed)
We will go ahead and start her on Vivelle dot 0. 05 patches and Prometrium 100 mg at bedtime. I discussed this with her at her office visit. His any questions about usage that she can let me know. I want her to call me in one month in follow up to see how she's doing.

## 2011-11-08 NOTE — Telephone Encounter (Signed)
Patient was informed her FSH is in menopausal range. She does wish to start on HRT for symptoms.

## 2011-11-09 NOTE — Telephone Encounter (Signed)
Patient informed and instructed.  Dr. Velvet Bathe had already e-scribed the Rx's yesterday. ka

## 2011-12-04 ENCOUNTER — Ambulatory Visit (INDEPENDENT_AMBULATORY_CARE_PROVIDER_SITE_OTHER): Payer: 59 | Admitting: Medical

## 2011-12-04 ENCOUNTER — Encounter: Payer: Self-pay | Admitting: Medical

## 2011-12-04 VITALS — BP 112/80 | HR 60 | Temp 97.8°F | Wt 210.0 lb

## 2011-12-04 DIAGNOSIS — R32 Unspecified urinary incontinence: Secondary | ICD-10-CM | POA: Insufficient documentation

## 2011-12-04 DIAGNOSIS — R350 Frequency of micturition: Secondary | ICD-10-CM

## 2011-12-04 DIAGNOSIS — Z79899 Other long term (current) drug therapy: Secondary | ICD-10-CM

## 2011-12-04 DIAGNOSIS — Z760 Encounter for issue of repeat prescription: Secondary | ICD-10-CM

## 2011-12-04 DIAGNOSIS — G2581 Restless legs syndrome: Secondary | ICD-10-CM

## 2011-12-04 LAB — POCT URINALYSIS DIPSTICK
Bilirubin, UA: NEGATIVE
Blood, UA: NEGATIVE
Glucose, UA: NEGATIVE
Ketones, UA: NEGATIVE
Leukocytes, UA: NEGATIVE
Nitrite, UA: NEGATIVE
Spec Grav, UA: 1.025
Urobilinogen, UA: NEGATIVE
pH, UA: 7
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
## 2011-12-06 ENCOUNTER — Other Ambulatory Visit: Payer: Self-pay | Admitting: Family Medicine

## 2011-12-06 DIAGNOSIS — Z1231 Encounter for screening mammogram for malignant neoplasm of breast: Secondary | ICD-10-CM

## 2011-12-13 ENCOUNTER — Other Ambulatory Visit: Payer: Self-pay | Admitting: Family Medicine

## 2011-12-13 DIAGNOSIS — R609 Edema, unspecified: Secondary | ICD-10-CM

## 2011-12-13 DIAGNOSIS — E785 Hyperlipidemia, unspecified: Secondary | ICD-10-CM

## 2011-12-13 MED ORDER — ROSUVASTATIN CALCIUM 20 MG PO TABS: 20.0000 mg | ORAL_TABLET | Freq: Every day | ORAL | Status: DC

## 2011-12-13 MED ORDER — TRIAMTERENE-HCTZ 37.5-25 MG PO CAPS: 1.0000 | ORAL_CAPSULE | ORAL | Status: DC

## 2011-12-13 NOTE — Telephone Encounter (Signed)
Pt needs refill on Crestor and "fluid pill" states she has been trying since December to get Crestor refilled but has not been sent in to pharmacy  [facility]

## 2011-12-14 ENCOUNTER — Telehealth: Payer: Self-pay | Admitting: Family Medicine

## 2011-12-14 NOTE — Telephone Encounter (Signed)
Patient was informed that you need to review her records and then speak to her neurologist. CLS Patient states that the neurologists does not see her for her RLS. CLS

## 2011-12-14 NOTE — Telephone Encounter (Signed)
Advise pt that records were just received from her neurologist this week, and I haven't had a chance to review them yet.  My plan is to review them, and then call neuro to discuss her medications (in general, to see if we can consolidate meds).  Since she was under the neurologist's care specifically for her RLS, she should call them for recommendations.  As I said, I plan to call them to discuss her meds, but this won't be until next week.

## 2011-12-17 ENCOUNTER — Other Ambulatory Visit: Payer: Self-pay | Admitting: *Deleted

## 2011-12-17 NOTE — Telephone Encounter (Signed)
Dr. Larina Earthly records were reviewed.  She had seen him and discussed RLS with him in 12/2010, dose was recommended to be titrated up to 2mg .  She has been on carbamazepine for about 2 months.  Seemed to help initially, not sure if helping much any more, perhaps symptoms are less often.  She has f/u scheduled with Dr. Antonietta Barcelona on 2/1.  Her fibromyalgia is not well controlled despite the Lyrica, Cymbalta and other  meds she is on.  Starting water therapy and water aerobics soon, as well as getting massages every other week per Dr. Corliss Skains.    Review of chart shows last labs (other than TSH and FSH through GYN) was in June.  She is due for c-met, CBC, iron stores and vitamin D.  She will let Dr. Antonietta Barcelona know of my desire for these labs, and he can either order them, along with whatever labs he feels is indicated, versus sending her here with a Rx for labs.  I recommend that she get a pharmacy consult. Please try and arrange. Thanks

## 2011-12-19 ENCOUNTER — Ambulatory Visit: Payer: 59

## 2011-12-20 ENCOUNTER — Ambulatory Visit
Admission: RE | Admit: 2011-12-20 | Discharge: 2011-12-20 | Disposition: A | Discharge status: Home / Self Care | Payer: 59 | Source: Ambulatory Visit | Attending: Family Medicine | Admitting: Family Medicine

## 2011-12-20 DIAGNOSIS — Z1231 Encounter for screening mammogram for malignant neoplasm of breast: Secondary | ICD-10-CM

## 2012-01-28 ENCOUNTER — Encounter: Payer: Self-pay | Admitting: Gynecology

## 2012-01-28 ENCOUNTER — Other Ambulatory Visit (HOSPITAL_COMMUNITY)
Admission: RE | Admit: 2012-01-28 | Discharge: 2012-01-28 | Disposition: A | Discharge status: Home / Self Care | Payer: 59 | Source: Ambulatory Visit | Attending: Gynecology | Admitting: Gynecology

## 2012-01-28 ENCOUNTER — Ambulatory Visit (INDEPENDENT_AMBULATORY_CARE_PROVIDER_SITE_OTHER): Payer: 59 | Admitting: Gynecology

## 2012-01-28 VITALS — BP 124/76 | Ht 63.0 in | Wt 212.0 lb

## 2012-01-28 DIAGNOSIS — Z01419 Encounter for gynecological examination (general) (routine) without abnormal findings: Secondary | ICD-10-CM | POA: Insufficient documentation

## 2012-01-28 DIAGNOSIS — Z79890 Hormone replacement therapy (postmenopausal): Secondary | ICD-10-CM

## 2012-01-28 MED ORDER — PROGESTERONE MICRONIZED 100 MG PO CAPS: 100.0000 mg | ORAL_CAPSULE | Freq: Every day | ORAL | Status: DC

## 2012-01-28 MED ORDER — ESTRADIOL 0.05 MG/24HR TD PTTW: 1.0000 | MEDICATED_PATCH | TRANSDERMAL | Status: DC

## 2012-01-28 NOTE — Progress Notes (Signed)
[name] 08-12-1956 [id number]        56 y.o.  for annual exam and follow up of her recently initiated HRT.  Past medical history,surgical history, medications, allergies, family history and social history were all reviewed and documented in the EPIC chart. ROS:  Was performed and pertinent positives and negatives are included in the history.  Exam: [name] chaperone present Filed Vitals:   01/28/12 1054  BP: 124/76   General appearance  Normal Skin grossly normal Head/Neck normal with no cervical or supraclavicular adenopathy thyroid normal Lungs  clear Cardiac RR, without RMG Abdominal  soft, nontender, without masses, organomegaly or hernia Breasts  examined lying and sitting without masses, retractions, discharge or axillary adenopathy. Pelvic  Ext/BUS/vagina  normal with atrophic genital changes  Cervix  normal with Pap done  Uterus  axial, normal size, shape and contour, midline and mobile nontender   Adnexa  Without masses or tenderness    Anus and perineum  normal   Rectovaginal  normal sphincter tone without palpated masses or tenderness.    Assessment/Plan:  56 y.o. female for annual exam.    1. HRT. Patient recently initiated HRT and is on Vivelle 0.05 mg Datsun Prometrium 100 mg nightly. She is status post NovaSure endometrial ablation 2007 is amenorrheic. She presented with significant menopausal symptoms in December 2012 oh we discussed HRT with risks benefits as noted in my note. She called back and elected to start HRT has done well with significant reduction in her risks. I again reviewed the risks of HRT to include the WHI study, stroke heart attack DVT increased risk of breast cancer although she understands accepts and I refilled her with minivelle 0.05 mg patches and Prometrium 100 mg nightly.  Need to report any bleeding discussed. 2. Pap smear. She has no records in her chart a Pap smear so I went ahead and did a Pap smear today. 3. Mammography. Patient recently  had mammogram January 2013 will continue with annual mammograms. SBE monthly reviewed. 4. Bone health. She reports having a DEXA through her primary physicians office 2 years ago and said it was normal and she'll follow up with them in reference to recommend a repeat schedule. Increase calcium vitamin D reviewed. 5. Colonoscopy. Patient had colonoscopy 56 years ago. She could check with them to see when they recommend repeat. 6. Health maintenance. No blood work was done as is done through her other physician's office who follows her for her multiple medical issues. Assuming she continues well from a gynecologic standpoint then she will see me in a year, sooner as needed.    Dara Lords MD, 11:41 AM 01/28/2012

## 2012-02-01 ENCOUNTER — Encounter: Payer: Self-pay | Admitting: Internal Medicine

## 2012-02-02 ENCOUNTER — Other Ambulatory Visit: Payer: Self-pay | Admitting: Family Medicine

## 2012-02-06 ENCOUNTER — Ambulatory Visit (INDEPENDENT_AMBULATORY_CARE_PROVIDER_SITE_OTHER): Payer: 59 | Admitting: Family Medicine

## 2012-02-06 ENCOUNTER — Encounter: Payer: Self-pay | Admitting: Family Medicine

## 2012-02-06 VITALS — BP 140/88 | HR 72 | Ht 62.5 in | Wt 218.0 lb

## 2012-02-06 DIAGNOSIS — IMO0001 Reserved for inherently not codable concepts without codable children: Secondary | ICD-10-CM

## 2012-02-06 DIAGNOSIS — Z79899 Other long term (current) drug therapy: Secondary | ICD-10-CM

## 2012-02-06 DIAGNOSIS — F411 Generalized anxiety disorder: Secondary | ICD-10-CM

## 2012-02-06 DIAGNOSIS — F329 Major depressive disorder, single episode, unspecified: Secondary | ICD-10-CM

## 2012-02-06 DIAGNOSIS — G43909 Migraine, unspecified, not intractable, without status migrainosus: Secondary | ICD-10-CM

## 2012-02-06 DIAGNOSIS — G2581 Restless legs syndrome: Secondary | ICD-10-CM

## 2012-02-06 DIAGNOSIS — K3184 Gastroparesis: Secondary | ICD-10-CM

## 2012-02-06 DIAGNOSIS — Z760 Encounter for issue of repeat prescription: Secondary | ICD-10-CM

## 2012-02-06 DIAGNOSIS — F3289 Other specified depressive episodes: Secondary | ICD-10-CM

## 2012-02-06 NOTE — Patient Instructions (Signed)
I put in phone call to Amy P to discuss your disability forms.  I feel they should be filled out by the specialists following you for the conditions which contribute to disability (ie Dr. Adriana Simas for gastroparesis, and Dr. Corliss Skains for fibromyalgia).  I will discuss this with her.  We reviewed your medications today.  There is still some overlap in your medications--ie the Topamax for headaches may not be necessary if you remain on the nortriptylene.  And if your neuropathy is improving, and fibromyalgia is doing okay (especially since Cymbalta dose was just increased, which also treats fibromyalgia), then consider tapering off Lyrica.  You should discuss both of these medications with Dr. Corliss Skains, who prescribes them.

## 2012-02-06 NOTE — Progress Notes (Signed)
Patient presents to discuss forms.  She needs physical assessment form filled out for her to continue to get disability.  Dr. Adriana Simas (GI) has filled out her disability forms in the past.  She was on short-term disability in 2011, and on long-term disability since approx 04/2011.  She is on disability mainly related to her stomach, gastroparesis, sick in bed due to her stomach at least 2-3 days per week, which caused her to miss a lot of work.  She wakes up nauseated, needs to take meds and lie down before she can get going in the mornings.  Most mornings are difficult for her.  She has been okay the last 2-3 days, but was sick over the weekend--vomiting, mainly dry heaves and nausea.  She also feels that her fibromyalgia contributes to limitations with work, although she has been able to work through some of her pain, she is unable to work with the nausea/vomiting.  Anxiety and depression is worsening.  She saw Dr. Nolen Mu last week, and was put on xanax, and increased her Cymbalta to 120 mg daily, but hasn't increased it yet. Xanax seems to make her feel a little sleepy, more than the lorazepam used to, but she was also started on nortriptylene and clonazepam by her neurologist, which may contribute to her sedation.  Dr. Delphia Grates put her on nortriptylene last week for burning in feet, and clonazepam for RLS, and stopped the Requip, Lunesta, Lorazepam. She continues to take topamax given by Dr. Corliss Skains for headaches, and Lyrica for fibromyalgia.  Past Medical History  Diagnosis Date  . Fibromyalgia   . GERD (gastroesophageal reflux disease)   . Obstructive sleep apnea     On Cpap 2009  . Anxiety   . Depression   . Restless leg syndrome   . Edema   . Migraine   . Hyperlipidemia   . Anemia     previously followed by Dr. Dalene Carrow for anemia and elevated platelets  . Paresthesia     Dr. Antonietta Barcelona at [facility]  . Tremor     Dr. Antonietta Barcelona  . Gastroparesis     followed at [facility]  . Pneumonia    2012  . Plantar fasciitis 02/2011    R foot  . S/P endometrial ablation 08/09/2006    Novasure Ablation   Past Surgical History  Procedure Date  . Tonsillectomy 1968  . Knee surgery 1999    R knee, Dr. Renae Fickle, torn cartilage  . Cholecystectomy 9/05  . Endometrial ablation 08/09/2006    Dr. Kingsley Plan Ablation   History   Social History  . Marital Status: Married    Spouse Name: N/A    Number of Children: 2  . Years of Education: N/A   Occupational History  . customer service (on disability) [employer]   Social History Main Topics  . Smoking status: Never Smoker   . Smokeless tobacco: Never Used  . Alcohol Use: No  . Drug Use: No  . Sexually Active: Yes    Birth Control/ Protection: Post-menopausal   Other Topics Concern  . Not on file   Social History Narrative  . No narrative on file   Current Outpatient Prescriptions on File Prior to Visit  Medication Sig Dispense Refill  . Armodafinil (NUVIGIL) 250 MG tablet Take 250 mg by mouth daily.        . Cetirizine HCl 10 MG CAPS Take 10 mg by mouth daily.        . Cholecalciferol (VITAMIN D) 2000  UNITS tablet Take 2,000 Units by mouth daily.        Marland Kitchen dexlansoprazole (DEXILANT) 60 MG capsule Take 60 mg by mouth daily.        Marland Kitchen dronabinol (MARINOL) 5 MG capsule Take 5 mg by mouth 2 (two) times daily before a meal.        . DULoxetine (CYMBALTA) 30 MG capsule Take 120 mg by mouth daily.       Marland Kitchen estradiol (MINIVELLE) 0.05 MG/24HR Place 1 patch (0.05 mg total) onto the skin once a week.  8 patch  12  . fluticasone (FLONASE) 50 MCG/ACT nasal spray Place 2 sprays into the nose daily.  16 g  5  . Multiple Vitamin (MULTIVITAMIN PO) Take 2 each by mouth daily.        . NON FORMULARY Domperidone 20mg  four times daily       . omega-3 acid ethyl esters (LOVAZA) 1 G capsule Take 2 capsules (2 g total) by mouth 2 (two) times daily.  360 capsule  1  . ondansetron (ZOFRAN) 4 MG tablet Take 4 mg by mouth every 8 (eight) hours as  needed.        . pregabalin (LYRICA) 75 MG capsule Take 150 mg by mouth 2 (two) times daily.       . prochlorperazine (COMPAZINE) 10 MG tablet As needed      . progesterone (PROMETRIUM) 100 MG capsule Take 1 capsule (100 mg total) by mouth at bedtime.  30 capsule  11  . promethazine (PHENERGAN) 25 MG tablet Take 25 mg by mouth every 6 (six) hours as needed.        . rosuvastatin (CRESTOR) 20 MG tablet Take 1 tablet (20 mg total) by mouth daily.  30 tablet  4  . sucralfate (CARAFATE) 1 GM/10ML suspension Take 1 g by mouth 4 (four) times daily -  with meals and at bedtime.        . SUMAtriptan (IMITREX) 50 MG tablet TAKE 1 TABLET (50 MG TOTAL) BY MOUTH EVERY 2 (TWO) HOURS AS NEEDED FOR MIGRAINE. MAX 200 MG/DAY  10 tablet  0  . topiramate (TOPAMAX) 50 MG tablet Take 50 mg by mouth daily.        Marland Kitchen triamterene-hydrochlorothiazide (DYAZIDE) 37.5-25 MG per capsule Take 1 each (1 capsule total) by mouth every morning.  30 capsule  4  . albuterol (VENTOLIN HFA) 108 (90 BASE) MCG/ACT inhaler Inhale 2 puffs into the lungs every 4 (four) hours as needed.        . hydrocortisone (ANUSOL-HC) 2.5 % rectal cream Apply to external hemorrhoids three times daily as needed for inflamed hemorrhoids  30 g  1  . hydrOXYzine (ATARAX/VISTARIL) 50 MG tablet You may use anywhere from 1/2 tablet, up to 2 tablets, every 6 hours as needed for itching.  May be more sedating at the higher doses  30 tablet  1  . NON FORMULARY Place 1 application onto the skin 2 (two) times daily. Derma tran cream.       . traMADol (ULTRAM) 50 MG tablet As needed       Allergies  Allergen Reactions  . Erythromycin Nausea Only  . Tramadol Itching   ROS: +nausea, vomiting, headaches, fibromyalgia pain, +feels tired.  RLS improved since change of meds.  Denies fevers, URI symptoms, shortness of breath, chest pain.  PHYSICAL EXAM: BP 140/88  Pulse 72  Ht 5' 2.5" (1.588 m)  Wt 218 lb (98.884 kg)  BMI 39.24 kg/m2  LMP 07/27/2006 Tearful  female, sitting comfortably in no acute distress. Remainder of visit was limited to discussion, review of meds  ASSESSMENT/PLAN: 1. ANXIETY   2. Depressive disorder, not elsewhere classified   3. FIBROMYALGIA   4. Gastroparesis   5. MIGRAINE HEADACHE   6. Polypharmacy   7. Restless leg syndrome    Meds reviewed at length.  Cymbalta dose was recommended to be increased to 120mg  daily by Dr. Nolen Mu.  She hasn't changed dose yet.  Discussed doing this 60mg  BID rather than all at once to minimize nausea with higher dose.  This might help her fibromyalgia pain, and if so, consider tapering off lyrica.  Also, if her neuropathy improves with the nortriptylene, can also consider tapering off lyrica.  She is reportedly on topamax for headaches, not for moods, so can consider stopping given that nortriptylene will also work to prevent migraines.  She will discuss these recommendations regarding Lyrica and Topamax with Dr. Corliss Skains at follow-up.  With respect to her disability and physical assessment forms--the forms seem irrelevant (dealing with [employer] limitations).  Her two diagnoses which contribute to her inability to work are managed by specialists (Dr. Adriana Simas for her gastroparesis, and fibromyalgia by Dr. Corliss Skains).  Therefore, I do not feel it appropriate for me to fill out this paperwork.  I put a call into Amy P, the woman from the insurance company--pt states she spoke with her, and that she wouldn't send these forms to Dr. Adriana Simas or Dr. Corliss Skains (?).  I will talk to her personally regarding my recommendations.  She feels that the need to fill out these forms is making her look bad, like she is making up reasons not to work.  We discussed that it is standard to have reassessments, as sometimes conditions can improve. Advised not to take this reassessment personally.  Face to face visit time, more than 1/2 counseling re: meds, was 25 minutes

## 2012-02-08 ENCOUNTER — Telehealth: Payer: Self-pay | Admitting: Internal Medicine

## 2012-02-08 NOTE — Telephone Encounter (Signed)
I spoke with rep at [facility], who will pass on info to Raquel, who is managing her case.  Had sent request to Dr. Alycia Rossetti end of January, but hadn't gotten response.  It is standard to do review after 24 months, when coverage changes from being "unable to do THEIR job" to being unable to do "ANY" job.  This shouldn't affect her in any way, given that her job was sedentary.  She will pass on info to Raquel to be back in touch with Dr. Coral Else office.  Pt was advised of all of this info, and reassured.  She was anxious on phone, discussed some relaxation techniques.

## 2012-03-04 ENCOUNTER — Telehealth: Payer: Self-pay | Admitting: Family Medicine

## 2012-03-04 DIAGNOSIS — R195 Other fecal abnormalities: Secondary | ICD-10-CM | POA: Insufficient documentation

## 2012-03-04 DIAGNOSIS — N281 Cyst of kidney, acquired: Secondary | ICD-10-CM | POA: Insufficient documentation

## 2012-03-04 NOTE — Telephone Encounter (Signed)
LM

## 2012-03-05 ENCOUNTER — Encounter (HOSPITAL_COMMUNITY): Payer: Self-pay | Admitting: Emergency Medicine

## 2012-03-05 ENCOUNTER — Emergency Department (HOSPITAL_COMMUNITY): Payer: 59

## 2012-03-05 ENCOUNTER — Encounter: Payer: Self-pay | Admitting: Family Medicine

## 2012-03-05 ENCOUNTER — Emergency Department (HOSPITAL_COMMUNITY)
Admission: EM | Admit: 2012-03-05 | Discharge: 2012-03-05 | Disposition: A | Discharge status: Home / Self Care | Payer: 59 | Attending: Emergency Medicine | Admitting: Emergency Medicine

## 2012-03-05 DIAGNOSIS — N200 Calculus of kidney: Secondary | ICD-10-CM | POA: Insufficient documentation

## 2012-03-05 DIAGNOSIS — R10819 Abdominal tenderness, unspecified site: Secondary | ICD-10-CM | POA: Insufficient documentation

## 2012-03-05 DIAGNOSIS — N132 Hydronephrosis with renal and ureteral calculous obstruction: Secondary | ICD-10-CM

## 2012-03-05 DIAGNOSIS — N949 Unspecified condition associated with female genital organs and menstrual cycle: Secondary | ICD-10-CM | POA: Insufficient documentation

## 2012-03-05 DIAGNOSIS — N201 Calculus of ureter: Secondary | ICD-10-CM | POA: Insufficient documentation

## 2012-03-05 DIAGNOSIS — N1330 Unspecified hydronephrosis: Secondary | ICD-10-CM | POA: Insufficient documentation

## 2012-03-05 LAB — DIFFERENTIAL
Basophils Absolute: 0.1 10*3/uL (ref 0.0–0.1)
Basophils Relative: 0 % (ref 0–1)
Eosinophils Absolute: 0.3 10*3/uL (ref 0.0–0.7)
Eosinophils Relative: 2 % (ref 0–5)
Lymphocytes Relative: 18 % (ref 12–46)
Lymphs Abs: 2.3 10*3/uL (ref 0.7–4.0)
Monocytes Absolute: 0.9 10*3/uL (ref 0.1–1.0)
Monocytes Relative: 7 % (ref 3–12)
Neutro Abs: 9.1 10*3/uL — ABNORMAL HIGH (ref 1.7–7.7)
Neutrophils Relative %: 73 % (ref 43–77)

## 2012-03-05 LAB — URINALYSIS, ROUTINE W REFLEX MICROSCOPIC
Bilirubin Urine: NEGATIVE
Glucose, UA: NEGATIVE mg/dL
Hgb urine dipstick: NEGATIVE
Ketones, ur: NEGATIVE mg/dL
Leukocytes, UA: NEGATIVE
Nitrite: NEGATIVE
Protein, ur: NEGATIVE mg/dL
Specific Gravity, Urine: 1.021 (ref 1.005–1.030)
Urobilinogen, UA: 0.2 mg/dL (ref 0.0–1.0)
pH: 6 (ref 5.0–8.0)

## 2012-03-05 LAB — BASIC METABOLIC PANEL
BUN: 18 mg/dL (ref 6–23)
CO2: 22 mEq/L (ref 19–32)
Calcium: 8.8 mg/dL (ref 8.4–10.5)
Chloride: 102 mEq/L (ref 96–112)
Creatinine, Ser: 1.19 mg/dL — ABNORMAL HIGH (ref 0.50–1.10)
GFR calc Af Amer: 58 mL/min — ABNORMAL LOW (ref >90)
GFR calc non Af Amer: 50 mL/min — ABNORMAL LOW (ref >90)
Glucose, Bld: 125 mg/dL — ABNORMAL HIGH (ref 70–99)
Potassium: 3.6 mEq/L (ref 3.5–5.1)
Sodium: 138 mEq/L (ref 135–145)

## 2012-03-05 LAB — CBC
HCT: 37.4 % (ref 36.0–46.0)
Hemoglobin: 11.9 g/dL — ABNORMAL LOW (ref 12.0–15.0)
MCH: 28.3 pg (ref 26.0–34.0)
MCHC: 31.8 g/dL (ref 30.0–36.0)
MCV: 88.8 fL (ref 78.0–100.0)
Platelets: 438 10*3/uL — ABNORMAL HIGH (ref 150–400)
RBC: 4.21 MIL/uL (ref 3.87–5.11)
RDW: 14.8 % (ref 11.5–15.5)
WBC: 12.6 10*3/uL — ABNORMAL HIGH (ref 4.0–10.5)

## 2012-03-05 MED ORDER — TAMSULOSIN HCL 0.4 MG PO CAPS: 0.4000 mg | ORAL_CAPSULE | Freq: Every day | ORAL | Status: DC

## 2012-03-05 MED ORDER — ONDANSETRON 8 MG PO TBDP
8.0000 mg | ORAL_TABLET | Freq: Once | ORAL | Status: AC
  Administered 2012-03-05: 8 mg via ORAL
  Filled 2012-03-05: qty 1

## 2012-03-05 MED ORDER — HYDROMORPHONE HCL PF 1 MG/ML IJ SOLN: 1.0000 mg | Freq: Once | INTRAMUSCULAR | Status: DC

## 2012-03-05 MED ORDER — KETOROLAC TROMETHAMINE 30 MG/ML IJ SOLN
60.0000 mg | Freq: Once | INTRAMUSCULAR | Status: AC
  Administered 2012-03-05: 60 mg via INTRAMUSCULAR
  Filled 2012-03-05: qty 1

## 2012-03-05 MED ORDER — PERCOCET 5-325 MG PO TABS: 1.0000 | ORAL_TABLET | ORAL | Status: AC | PRN

## 2012-03-05 MED ORDER — IOHEXOL 300 MG/ML  SOLN
100.0000 mL | Freq: Once | INTRAMUSCULAR | Status: AC | PRN
  Administered 2012-03-05: 100 mL via INTRAVENOUS

## 2012-03-05 MED ORDER — MORPHINE SULFATE 4 MG/ML IJ SOLN
6.0000 mg | Freq: Once | INTRAMUSCULAR | Status: AC
  Administered 2012-03-05: 4 mg via INTRAVENOUS
  Filled 2012-03-05: qty 1

## 2012-03-05 MED ORDER — ONDANSETRON HCL 4 MG/2ML IJ SOLN
4.0000 mg | Freq: Once | INTRAMUSCULAR | Status: AC
  Administered 2012-03-05: 4 mg via INTRAVENOUS
  Filled 2012-03-05: qty 2

## 2012-03-05 MED ORDER — IBUPROFEN 800 MG PO TABS: 800.0000 mg | ORAL_TABLET | Freq: Three times a day (TID) | ORAL | Status: AC | PRN

## 2012-03-05 MED ORDER — HYDROMORPHONE HCL PF 2 MG/ML IJ SOLN
2.0000 mg | Freq: Once | INTRAMUSCULAR | Status: AC
  Administered 2012-03-05: 2 mg via INTRAMUSCULAR
  Filled 2012-03-05: qty 1

## 2012-03-05 MED ORDER — ONDANSETRON HCL 4 MG/2ML IJ SOLN: 4.0000 mg | Freq: Once | INTRAMUSCULAR | Status: DC

## 2012-03-05 NOTE — ED Notes (Signed)
Pt states she is having pain in her lower abd/pelvic region that radiates into her back  Pt states she is not able to urinate but only small amts at a time  Pt also has dry heaves in triage  Pt states pain started around 1am

## 2012-03-05 NOTE — ED Provider Notes (Signed)
History     CSN: [id number]  Arrival date & time 03/05/12  4098   First MD Initiated Contact with Patient 03/05/12 [phone]      Chief Complaint  Patient presents with  . Pelvic Pain  . Urinary Retention    (Consider location/radiation/quality/duration/timing/severity/associated sxs/prior treatment) HPI Comments: Patient reports suprapubic pain that radiates to her left lower abdomen and left back that began abruptly at 1am, waking her from sleep.  The pain is constant and 9/10, described as pressure and aching.  Is having urinary frequency, urinating only small amounts at a time when she does go.  Associated nausea and dry heaves.  Denies fevers, change in bowel habits, vaginal bleeding or abnormal discharge.    Patient is a 56 y.o. female presenting with pelvic pain. The history is provided by the patient.  Pelvic Pain This is a new problem. The current episode started today. The problem occurs constantly. Associated symptoms include abdominal pain, nausea and urinary symptoms. Pertinent negatives include no change in bowel habit, chest pain, coughing, fever, sore throat or weakness. Exacerbated by: sitting upright. She has tried nothing for the symptoms.    Past Medical History  Diagnosis Date  . Fibromyalgia   . GERD (gastroesophageal reflux disease)   . Obstructive sleep apnea     On Cpap 2009  . Anxiety   . Depression   . Restless leg syndrome   . Edema   . Migraine   . Hyperlipidemia   . Anemia     previously followed by Dr. Dalene Carrow for anemia and elevated platelets  . Paresthesia     Dr. Antonietta Barcelona at [facility]  . Tremor     Dr. Antonietta Barcelona  . Gastroparesis     followed at [facility]  . Pneumonia     2012  . Plantar fasciitis 02/2011    R foot  . S/P endometrial ablation 08/09/2006    Novasure Ablation    Past Surgical History  Procedure Date  . Tonsillectomy 1968  . Knee surgery 1999    R knee, Dr. Renae Fickle, torn cartilage  . Cholecystectomy 9/05  . Endometrial  ablation 08/09/2006    Dr. Kingsley Plan Ablation    Family History  Problem Relation Age of Onset  . Heart disease Mother     pacemaker  . Allergies Mother   . Hypertension Mother   . Heart disease Father   . Hypertension Father   . Diabetes Father   . Heart disease Paternal Grandmother   . Heart disease Paternal Grandfather   . Asthma Sister   . Irritable bowel syndrome Sister   . Allergies Sister   . Allergies Sister     History  Substance Use Topics  . Smoking status: Never Smoker   . Smokeless tobacco: Never Used  . Alcohol Use: No    OB History    Grav Para Term Preterm Abortions TAB SAB Ect Mult Living   1 1 1       1       Review of Systems  Constitutional: Negative for fever.  HENT: Negative for sore throat.   Respiratory: Negative for cough.   Cardiovascular: Negative for chest pain.  Gastrointestinal: Positive for nausea and abdominal pain. Negative for diarrhea, constipation and change in bowel habit.  Genitourinary: Positive for frequency, difficulty urinating and pelvic pain. Negative for vaginal bleeding and vaginal discharge.  Neurological: Negative for weakness.  All other systems reviewed and are negative.    Allergies  Erythromycin and Tramadol  Home Medications   Current Outpatient Rx  Name Route Sig Dispense Refill  . ALBUTEROL SULFATE HFA 108 (90 BASE) MCG/ACT IN AERS Inhalation Inhale 2 puffs into the lungs every 4 (four) hours as needed.      . ALPRAZOLAM 0.25 MG PO TABS Oral Take 0.25 mg by mouth 2 (two) times daily as needed.    . ARMODAFINIL 250 MG PO TABS Oral Take 250 mg by mouth daily.      Marland Kitchen CETIRIZINE HCL 10 MG PO CAPS Oral Take 10 mg by mouth daily.      Marland Kitchen VITAMIN D 2000 UNITS PO TABS Oral Take 2,000 Units by mouth daily.      Marland Kitchen CLONAZEPAM 0.5 MG PO TABS Oral Take 0.5 mg by mouth 2 (two) times daily as needed. 1-2 tablets at bedtime    . DEXLANSOPRAZOLE 60 MG PO CPDR Oral Take 60 mg by mouth daily.      . DRONABINOL 5 MG  PO CAPS Oral Take 5 mg by mouth 2 (two) times daily before a meal.      . DULOXETINE HCL 30 MG PO CPEP Oral Take 120 mg by mouth daily.     Marland Kitchen ESTRADIOL 0.05 MG/24HR TD PTTW Transdermal Place 1 patch (0.05 mg total) onto the skin once a week. 8 patch 12  . FLUTICASONE PROPIONATE 50 MCG/ACT NA SUSP Nasal Place 2 sprays into the nose daily. 16 g 5  . HYDROCORTISONE 2.5 % RE CREA  Apply to external hemorrhoids three times daily as needed for inflamed hemorrhoids 30 g 1  . HYDROXYZINE HCL 50 MG PO TABS  You may use anywhere from 1/2 tablet, up to 2 tablets, every 6 hours as needed for itching.  May be more sedating at the higher doses 30 tablet 1  . MULTIVITAMIN PO Oral Take 2 each by mouth daily.      . NON FORMULARY  Domperidone 20mg  four times daily     . NON FORMULARY Transdermal Place 1 application onto the skin 2 (two) times daily. Derma tran cream.     . NORTRIPTYLINE HCL 10 MG PO CAPS Oral Take 10 mg by mouth at bedtime.    . OMEGA-3-ACID ETHYL ESTERS 1 G PO CAPS Oral Take 2 capsules (2 g total) by mouth 2 (two) times daily. 360 capsule 1  . ONDANSETRON HCL 4 MG PO TABS Oral Take 4 mg by mouth every 8 (eight) hours as needed.      Marland Kitchen PREGABALIN 75 MG PO CAPS Oral Take 150 mg by mouth 2 (two) times daily.     Marland Kitchen PROCHLORPERAZINE MALEATE 10 MG PO TABS  As needed    . PROGESTERONE MICRONIZED 100 MG PO CAPS Oral Take 1 capsule (100 mg total) by mouth at bedtime. 30 capsule 11  . PROMETHAZINE HCL 25 MG PO TABS Oral Take 25 mg by mouth every 6 (six) hours as needed.      Marland Kitchen ROSUVASTATIN CALCIUM 20 MG PO TABS Oral Take 1 tablet (20 mg total) by mouth daily. 30 tablet 4  . SUCRALFATE 1 GM/10ML PO SUSP Oral Take 1 g by mouth 4 (four) times daily -  with meals and at bedtime.      . SUMATRIPTAN SUCCINATE 50 MG PO TABS  TAKE 1 TABLET (50 MG TOTAL) BY MOUTH EVERY 2 (TWO) HOURS AS NEEDED FOR MIGRAINE. MAX 200 MG/DAY 10 tablet 0  . TOPIRAMATE 50 MG PO TABS Oral Take 50 mg by mouth daily.      Marland Kitchen  TRAMADOL HCL 50  MG PO TABS  As needed    . TRIAMTERENE-HCTZ 37.5-25 MG PO CAPS Oral Take 1 each (1 capsule total) by mouth every morning. 30 capsule 4    BP 116/67  Pulse 90  Temp 98.1 F (36.7 C)  Resp 20  SpO2 99%  LMP 07/27/2006  Physical Exam  Nursing note and vitals reviewed. Constitutional: She is oriented to person, place, and time. She appears well-developed and well-nourished. No distress.  HENT:  Head: Normocephalic and atraumatic.  Neck: Neck supple.  Cardiovascular: Normal rate, regular rhythm and normal heart sounds.   Pulmonary/Chest: Breath sounds normal. No respiratory distress. She has no wheezes. She has no rales. She exhibits no tenderness.  Abdominal: Soft. Bowel sounds are normal. She exhibits no distension and no mass. There is tenderness in the suprapubic area and left lower quadrant. There is no rebound, no guarding and no CVA tenderness.  Neurological: She is alert and oriented to person, place, and time.  Skin: She is not diaphoretic.  Psychiatric: She has a normal mood and affect. Her behavior is normal. Judgment and thought content normal.    ED Course  Procedures (including critical care time)  Labs Reviewed  CBC - Abnormal; Notable for the following:    WBC 12.6 (*)    Hemoglobin 11.9 (*)    Platelets 438 (*)    All other components within normal limits  DIFFERENTIAL - Abnormal; Notable for the following:    Neutro Abs 9.1 (*)    All other components within normal limits  BASIC METABOLIC PANEL - Abnormal; Notable for the following:    Glucose, Bld 125 (*)    Creatinine, Ser 1.19 (*)    GFR calc non Af Amer 50 (*)    GFR calc Af Amer 58 (*)    All other components within normal limits  URINALYSIS, ROUTINE W REFLEX MICROSCOPIC  URINE CULTURE   Ct Abdomen Pelvis W Contrast  03/05/2012  *RADIOLOGY REPORT*  Clinical Data: Abdominal pain, pelvic pain, urinary retention, back pain.  CT ABDOMEN AND PELVIS WITH CONTRAST  Technique:  Multidetector CT imaging of the  abdomen and pelvis was performed following the standard protocol during bolus administration of intravenous contrast.  Contrast: OMNIPAQUE IOHEXOL 300 MG/ML  SOLN  Comparison: CT abdomen and 08/02/2008  Findings: Mild atelectasis at the lung base.  No pericardial fluid.  No focal hepatic lesion.  Prior cholecystectomy.  The common bile duct is mildly dilated likely in relation to the cholecystectomy. This is similar to prior.  The pancreas, spleen, adrenal glands are normal.  There is left renal edema and perinephric stranding.  There is mild hydronephrosis and hydroureter on the left.  This is secondary to an obstructing calculus at the distal ureter.  The calculus measures 4 mm just proximal to the vesicoureteral junction (image 88).  Calculus is not readily apparent on the scout CT.  There is no evidence of nephrolithiasis on the left or right.  No ureterolithiasis on the right. On the delayed pyelogram phase imaging,  there is delayed excretion from the left kidney.  The stomach, small bowel, and colon are normal.  Abdominal aorta is normal caliber.  No retroperitoneal lymphadenopathy.  No free fluid the pelvis.  The uterus and bladder normal.  No pelvic lymphadenopathy. Review of  bone windows demonstrates no aggressive osseous lesions.  IMPRESSION:  1.  Obstructing calculus at the left vesicoureteral junction with hydroureter and hydronephrosis on the left. 2. Delayed excretion from  the left kidney secondary to obstruction.  Original Report Authenticated By: Genevive Bi, M.D.    10:45 AM Patient reports continued pain in suprapubic area, LLQ.  States she has been to urinate x 2 feeling that her bladder is full and very little comes out.  I have ordered a foley catheter.    Foley drained 150cc.    1. Ureteral stone with hydronephrosis       MDM  Afebrile patient with acute onset left lower quadrant and suprapubic abdominal pain and urinary symptoms found to have 4mm ureteral stone with  hydronephrosis.  Patient described what sounded like urinary retention, with a full bladder and frequency, small amount of urinary each time.  Foley catheter placed with only 150cc drained.  Patient's symptoms much more likely due to irritation from the stone.  Foley discontinued.  UA did not show infection.  Urine sent for culture.  This is patient's first known kidney/ureteral stone.  Pain control achieved in ED. I have discussed care with patient and husband, have encouraged her to strain all urine and follow up with urologist.  Discussed return precautions.  Both patient and husband verbalize understanding and agreement with plan.          Dillard Cannon [place], [place] 03/05/12 [phone]

## 2012-03-05 NOTE — ED Notes (Signed)
Pt has not had a pelvic so unable to answer most of genitourinary questions.

## 2012-03-05 NOTE — ED Provider Notes (Signed)
Medical screening examination/treatment/procedure(s) were performed by non-physician practitioner and as supervising physician I was immediately available for consultation/collaboration.  Ethelda Chick, MD 03/05/12 [phone]

## 2012-03-05 NOTE — Discharge Instructions (Signed)
Please call the urologist listed above to schedule a follow up appointment.  Strain all of your urine until your pass the stone.  If you catch the stone, please take it to the urology appointment with you.  If you develop high fevers, uncontrolled pain or vomiting, are unable to urinate, or unable to tolerate fluids by mouth, return to the ER immediately.  You may return to the ER at any time for worsening condition or any new symptoms that concern you.   Kidney Stones Kidney stones (ureteral lithiasis) are solid masses that form inside your kidneys. The intense pain is caused by the stone moving through the kidney, ureter, bladder, and urethra (urinary tract). When the stone moves, the ureter starts to spasm around the stone. The stone is usually passed in the urine.  HOME CARE  Drink enough fluids to keep your pee (urine) clear or pale yellow. This helps to get the stone out.   Strain all pee through the provided strainer. Do not pee without peeing through the strainer, not even once. If you pee the stone out, catch it. The stone may be as small as a grain of salt. Take this to your doctor.   Only take medicine as told by your doctor.   Follow up with your doctor as told.   Get follow-up X-rays as told by your doctor.  GET HELP RIGHT AWAY IF:   Your pain does not get better with medicine.   You have a fever.   Your pain increases and gets worse over 18 hours.   You have new belly (abdominal) pain.   You feel faint or pass out.  MAKE SURE YOU:   Understand these instructions.   Will watch your condition.   Will get help right away if you are not doing well or get worse.  Document Released: 04/30/2008 Document Revised: 11/01/2011 Document Reviewed: 09/09/2009 [facility] Patient Information 2012 [place], [place].  Ureteral Colic (Kidney Stones) Ureteral colic is the result of a condition when kidney stones form inside the kidney. Once kidney stones are formed they may move into the  tube that connects the kidney with the bladder (ureter). If this occurs, this condition may cause pain (colic) in the ureter.  CAUSES  Pain is caused by stone movement in the ureter and the obstruction caused by the stone. SYMPTOMS  The pain comes and goes as the ureter contracts around the stone. The pain is usually intense, sharp, and stabbing in character. The location of the pain may move as the stone moves through the ureter. When the stone is near the kidney the pain is usually located in the back and radiates to the belly (abdomen). When the stone is ready to pass into the bladder the pain is often located in the lower abdomen on the side the stone is located. At this location, the symptoms may mimic those of a urinary tract infection with urinary frequency. Once the stone is located here it often passes into the bladder and the pain disappears completely. TREATMENT   Your caregiver will provide you with medicine for pain relief.   You may require specialized follow-up X-rays.   The absence of pain does not always mean that the stone has passed. It may have just stopped moving. If the urine remains completely obstructed, it can cause loss of kidney function or even complete destruction of the involved kidney. It is your responsibility and in your interest that X-rays and follow-ups as suggested by your caregiver are  completed. Relief of pain without passage of the stone can be associated with severe damage to the kidney, including loss of kidney function on that side.   If your stone does not pass on its own, additional measures may be taken by your caregiver to ensure its removal.  HOME CARE INSTRUCTIONS   Increase your fluid intake. Water is the preferred fluid since juices containing vitamin C may acidify the urine making it less likely for certain stones (uric acid stones) to pass.   Strain all urine. A strainer will be provided. Keep all particulate matter or stones for your caregiver  to inspect.   Take your pain medicine as directed.   Make a follow-up appointment with your caregiver as directed.   Remember that the goal is passage of your stone. The absence of pain does not mean the stone is gone. Follow your caregiver's instructions.   Only take over-the-counter or prescription medicines for pain, discomfort, or fever as directed by your caregiver.  SEEK MEDICAL CARE IF:   Pain cannot be controlled with the prescribed medicine.   You have a fever.   Pain continues for longer than your caregiver advises it should.   There is a change in the pain, and you develop chest discomfort or constant abdominal pain.   You feel faint or pass out.  MAKE SURE YOU:   Understand these instructions.   Will watch your condition.   Will get help right away if you are not doing well or get worse.  Document Released: 08/22/2005 Document Revised: 11/01/2011 Document Reviewed: 05/09/2011 [facility] Patient Information 2012 [place], [place].   Urine Strainer This strainer is used to catch or filter out any stones found in your urine. Place the strainer under your urine stream. Save any stones or objects that you find in your urine. Place them in a plastic or glass container to show your caregiver. The stones vary in size - some can be very small, so make sure you check the strainer carefully. Your caregiver may send the stone to the lab. When the results are back, your caregiver may recommend medicines or diet changes.  Document Released: 08/17/2004 Document Revised: 11/01/2011 Document Reviewed: 09/24/2008 [facility] Patient Information 2012 [place], [place].

## 2012-03-05 NOTE — ED Notes (Signed)
After giving 4 of Morphine pt was out and asleep she did not get 6 mg so I let the ER MD know and she said 4 mg was fine

## 2012-03-05 NOTE — ED Notes (Signed)
PA into see pt and she began dry heaving.

## 2012-03-06 LAB — URINE CULTURE
Culture  Setup Time: 201304101441
Special Requests: NORMAL

## 2012-03-13 ENCOUNTER — Ambulatory Visit (INDEPENDENT_AMBULATORY_CARE_PROVIDER_SITE_OTHER)
Admission: RE | Admit: 2012-03-13 | Discharge: 2012-03-13 | Disposition: A | Discharge status: Home / Self Care | Payer: 59 | Source: Ambulatory Visit | Attending: Critical Care Medicine | Admitting: Critical Care Medicine

## 2012-03-13 ENCOUNTER — Ambulatory Visit (INDEPENDENT_AMBULATORY_CARE_PROVIDER_SITE_OTHER): Payer: 59 | Admitting: Critical Care Medicine

## 2012-03-13 ENCOUNTER — Encounter: Payer: Self-pay | Admitting: Critical Care Medicine

## 2012-03-13 VITALS — BP 114/68 | HR 89 | Temp 97.4°F | Ht 62.0 in | Wt 221.4 lb

## 2012-03-13 DIAGNOSIS — R059 Cough, unspecified: Secondary | ICD-10-CM

## 2012-03-13 DIAGNOSIS — T17908A Unspecified foreign body in respiratory tract, part unspecified causing other injury, initial encounter: Secondary | ICD-10-CM

## 2012-03-13 DIAGNOSIS — R05 Cough: Secondary | ICD-10-CM

## 2012-03-13 DIAGNOSIS — J690 Pneumonitis due to inhalation of food and vomit: Secondary | ICD-10-CM

## 2012-03-13 MED ORDER — BUDESONIDE 180 MCG/ACT IN AEPB: 1.0000 | INHALATION_SPRAY | Freq: Two times a day (BID) | RESPIRATORY_TRACT | Status: DC

## 2012-03-13 NOTE — Progress Notes (Signed)
Quick Note:  Notify the patient that the Xray is stable and no pneumonia No change in medications are recommended. Continue current meds as prescribed at last office visit ______ 

## 2012-03-13 NOTE — Patient Instructions (Signed)
Start pulmicort two puff twice daily Stop fish oil/lovaza Stay on Dexilant Strict reflux diet We will obtain records from Dr Alycia Rossetti A Chest xray will be obtained today, we will call results No other medication changes Return 2  With Tammy Parrett Nurse practictioner

## 2012-03-13 NOTE — Assessment & Plan Note (Signed)
History of pulmonary aspiration with aspiration induced pneumonitis secondary to gastroparesis Recurrent cough now likely on the basis of the same mechanism was microaspiration Plan Obtain chest x-ray Begin Pulmicort 2 inhalations twice daily No indication for antibiotics Discontinue fish oil for now Reflux diet strict Maintained Dexilant Obtain records from gastroenterology at [facility] Return 2 weeks with recheck by nurse practitioner

## 2012-03-13 NOTE — Progress Notes (Signed)
Subjective:    Patient ID: [name], female    DOB: 09/18/1956, 56 y.o.   MRN: [id number]  HPI  56 y.o. WF Hx of gastroparesis and microaspiration.  03/13/2012 Not seen since 6/12, ? Asp pna. Pt got better on her own.  Then ill again two weeks ago, noted more wheeze and dyspnea.  Worse supine.  No heartburn.  No coughing after meals.  Cough is dry. No pndrip.  No chest pain.  10/12:  cxr clear.  Past Medical History  Diagnosis Date  . Fibromyalgia   . GERD (gastroesophageal reflux disease)   . Obstructive sleep apnea     On Cpap 2009  . Anxiety   . Depression   . Restless leg syndrome   . Edema   . Migraine   . Hyperlipidemia   . Anemia     previously followed by Dr. Dalene Carrow for anemia and elevated platelets  . Paresthesia     Dr. Antonietta Barcelona at [facility]  . Tremor     Dr. Antonietta Barcelona  . Gastroparesis     followed at [facility]  . Pneumonia     2012  . Plantar fasciitis 02/2011    R foot  . S/P endometrial ablation 08/09/2006    Novasure Ablation     Family History  Problem Relation Age of Onset  . Heart disease Mother     pacemaker  . Allergies Mother   . Hypertension Mother   . Heart disease Father   . Hypertension Father   . Diabetes Father   . Heart disease Paternal Grandmother   . Heart disease Paternal Grandfather   . Asthma Sister   . Irritable bowel syndrome Sister   . Allergies Sister   . Allergies Sister      History   Social History  . Marital Status: Married    Spouse Name: N/A    Number of Children: 2  . Years of Education: N/A   Occupational History  . customer service (on disability) [employer]   Social History Main Topics  . Smoking status: Never Smoker   . Smokeless tobacco: Never Used  . Alcohol Use: No  . Drug Use: No  . Sexually Active: Yes    Birth Control/ Protection: Post-menopausal   Other Topics Concern  . Not on file   Social History Narrative  . No narrative on file     Allergies  Allergen Reactions  .  Erythromycin Nausea Only  . Tramadol Itching     Outpatient Prescriptions Prior to Visit  Medication Sig Dispense Refill  . albuterol (VENTOLIN HFA) 108 (90 BASE) MCG/ACT inhaler Inhale 2 puffs into the lungs every 4 (four) hours as needed. For shortness of breath      . ALPRAZolam (XANAX) 0.25 MG tablet Take 0.25 mg by mouth 2 (two) times daily as needed. For anxiety      . Armodafinil (NUVIGIL) 250 MG tablet Take 250 mg by mouth daily.       . Cetirizine HCl 10 MG CAPS Take 10 mg by mouth at bedtime.       . clonazePAM (KLONOPIN) 0.5 MG tablet Take 0.5-1 mg by mouth daily as needed. 1-2 tablets at bedtime      . dexlansoprazole (DEXILANT) 60 MG capsule Take 60 mg by mouth daily.       Marland Kitchen dronabinol (MARINOL) 5 MG capsule Take 10 mg by mouth 2 (two) times daily before a meal.       .  DULoxetine (CYMBALTA) 30 MG capsule Take 60 mg by mouth 2 (two) times daily.       Marland Kitchen estradiol (VIVELLE-DOT) 0.05 MG/24HR Place 1 patch onto the skin 2 (two) times a week.      . fluticasone (FLONASE) 50 MCG/ACT nasal spray Place 2 sprays into the nose daily.  16 g  5  . hydrOXYzine (ATARAX/VISTARIL) 50 MG tablet You may use anywhere from 1/2 tablet, up to 2 tablets, every 6 hours as needed for itching.  May be more sedating at the higher doses  30 tablet  1  . ibuprofen (ADVIL,MOTRIN) 800 MG tablet Take 1 tablet (800 mg total) by mouth every 8 (eight) hours as needed for pain.  21 tablet  0  . methocarbamol (ROBAXIN) 500 MG tablet Take 500 mg by mouth 2 (two) times daily as needed. For pain      . Multiple Vitamin (MULTIVITAMIN PO) Take 2 each by mouth daily.        . NON FORMULARY Domperidone 20mg  four times daily       . nortriptyline (PAMELOR) 10 MG capsule Take 40 mg by mouth at bedtime.       . ondansetron (ZOFRAN) 4 MG tablet Take 4 mg by mouth 4 (four) times daily - after meals and at bedtime. As needed       . PERCOCET 5-325 MG per tablet Take 1-2 tablets by mouth every 4 (four) hours as needed for pain.   25 tablet  0  . pregabalin (LYRICA) 75 MG capsule Take 75 mg by mouth 4 (four) times daily.       . Probiotic Product (PHILLIPS COLON HEALTH) CAPS Take 1 capsule by mouth daily.      . progesterone (PROMETRIUM) 100 MG capsule Take 1 capsule (100 mg total) by mouth at bedtime.  30 capsule  11  . promethazine (PHENERGAN) 25 MG tablet Take 25 mg by mouth 4 (four) times daily as needed.       . rosuvastatin (CRESTOR) 20 MG tablet Take 1 tablet (20 mg total) by mouth daily.  30 tablet  4  . sucralfate (CARAFATE) 1 GM/10ML suspension Take 1 g by mouth 4 (four) times daily -  with meals and at bedtime.        . Tamsulosin HCl (FLOMAX) 0.4 MG CAPS Take 1 capsule (0.4 mg total) by mouth daily after supper.  30 capsule  0  . topiramate (TOPAMAX) 50 MG tablet Take 50 mg by mouth at bedtime.       . triamterene-hydrochlorothiazide (DYAZIDE) 37.5-25 MG per capsule Take 1 capsule by mouth daily.      Marland Kitchen omega-3 acid ethyl esters (LOVAZA) 1 G capsule Take 2 capsules (2 g total) by mouth 2 (two) times daily.  360 capsule  1  . acetaminophen-codeine (TYLENOL #3) 300-30 MG per tablet Take 1 tablet by mouth at bedtime as needed. For pain          Review of Systems  Constitutional: Positive for fatigue. Negative for fever, chills, diaphoresis, activity change, appetite change and unexpected weight change.  HENT: Negative for hearing loss, ear pain, nosebleeds, congestion, sore throat, facial swelling, rhinorrhea, sneezing, mouth sores, trouble swallowing, neck pain, neck stiffness, dental problem, voice change, postnasal drip, sinus pressure, tinnitus and ear discharge.        Notes hoarseness, emesis and this makes pt hoarse   Eyes: Negative for photophobia, discharge, itching and visual disturbance.  Respiratory: Positive for cough and shortness of breath.  Negative for apnea, choking, chest tightness, wheezing and stridor.   Cardiovascular: Negative for chest pain, palpitations and leg swelling.    Gastrointestinal: Positive for nausea and vomiting. Negative for abdominal pain, constipation, blood in stool and abdominal distention.  Genitourinary: Positive for frequency. Negative for dysuria, urgency, hematuria, flank pain, decreased urine volume and difficulty urinating.  Musculoskeletal: Negative for myalgias, back pain, joint swelling, arthralgias and gait problem.  Skin: Negative for color change, pallor and rash.  Neurological: Positive for tremors, weakness and headaches. Negative for dizziness, seizures, syncope, speech difficulty, light-headedness and numbness.  Hematological: Negative for adenopathy. Does not bruise/bleed easily.  Psychiatric/Behavioral: Positive for sleep disturbance. Negative for confusion and agitation. The patient is nervous/anxious.        Objective:   Physical Exam  Filed Vitals:   03/13/12 1541  BP: 114/68  Pulse: 89  Temp: 97.4 F (36.3 C)  TempSrc: Oral  Height: 5\' 2"  (1.575 m)  Weight: 221 lb 6.4 oz (100.426 kg)  SpO2: 95%    Gen: Pleasant, well-nourished, in no distress,  normal affect  ENT: No lesions,  mouth clear,  oropharynx clear, no postnasal drip  Neck: No JVD, no TMG, no carotid bruits  Lungs: No use of accessory muscles, no dullness to percussion, clear without rales or rhonchi  Cardiovascular: RRR, heart sounds normal, no murmur or gallops, no peripheral edema  Abdomen: soft and NT, no HSM,  BS normal  Musculoskeletal: No deformities, no cyanosis or clubbing  Neuro: alert, non focal  Skin: Warm, no lesions or rashes        Assessment & Plan:   Pneumonia, aspiration History of pulmonary aspiration with aspiration induced pneumonitis secondary to gastroparesis Recurrent cough now likely on the basis of the same mechanism was microaspiration Plan Obtain chest x-ray Begin Pulmicort 2 inhalations twice daily No indication for antibiotics Discontinue fish oil for now Reflux diet strict Maintained  Dexilant Obtain records from gastroenterology at [facility] Return 2 weeks with recheck by nurse practitioner     Updated Medication List Outpatient Encounter Prescriptions as of 03/13/2012  Medication Sig Dispense Refill  . albuterol (VENTOLIN HFA) 108 (90 BASE) MCG/ACT inhaler Inhale 2 puffs into the lungs every 4 (four) hours as needed. For shortness of breath      . ALPRAZolam (XANAX) 0.25 MG tablet Take 0.25 mg by mouth 2 (two) times daily as needed. For anxiety      . Armodafinil (NUVIGIL) 250 MG tablet Take 250 mg by mouth daily.       . Cetirizine HCl 10 MG CAPS Take 10 mg by mouth at bedtime.       . clonazePAM (KLONOPIN) 0.5 MG tablet Take 0.5-1 mg by mouth daily as needed. 1-2 tablets at bedtime      . COMPRO 25 MG suppository as needed.      Marland Kitchen dexlansoprazole (DEXILANT) 60 MG capsule Take 60 mg by mouth daily.       Marland Kitchen dronabinol (MARINOL) 5 MG capsule Take 10 mg by mouth 2 (two) times daily before a meal.       . DULoxetine (CYMBALTA) 30 MG capsule Take 60 mg by mouth 2 (two) times daily.       Marland Kitchen estradiol (VIVELLE-DOT) 0.05 MG/24HR Place 1 patch onto the skin 2 (two) times a week.      . fluticasone (FLONASE) 50 MCG/ACT nasal spray Place 2 sprays into the nose daily.  16 g  5  . hydrOXYzine (ATARAX/VISTARIL) 50 MG tablet You  may use anywhere from 1/2 tablet, up to 2 tablets, every 6 hours as needed for itching.  May be more sedating at the higher doses  30 tablet  1  . ibuprofen (ADVIL,MOTRIN) 800 MG tablet Take 1 tablet (800 mg total) by mouth every 8 (eight) hours as needed for pain.  21 tablet  0  . methocarbamol (ROBAXIN) 500 MG tablet Take 500 mg by mouth 2 (two) times daily as needed. For pain      . Multiple Vitamin (MULTIVITAMIN PO) Take 2 each by mouth daily.        . NON FORMULARY Domperidone 20mg  four times daily       . nortriptyline (PAMELOR) 10 MG capsule Take 40 mg by mouth at bedtime.       . ondansetron (ZOFRAN) 4 MG tablet Take 4 mg by mouth 4 (four)  times daily - after meals and at bedtime. As needed       . PERCOCET 5-325 MG per tablet Take 1-2 tablets by mouth every 4 (four) hours as needed for pain.  25 tablet  0  . pregabalin (LYRICA) 75 MG capsule Take 75 mg by mouth 4 (four) times daily.       . Probiotic Product (PHILLIPS COLON HEALTH) CAPS Take 1 capsule by mouth daily.      . progesterone (PROMETRIUM) 100 MG capsule Take 1 capsule (100 mg total) by mouth at bedtime.  30 capsule  11  . promethazine (PHENERGAN) 25 MG tablet Take 25 mg by mouth 4 (four) times daily as needed.       . rosuvastatin (CRESTOR) 20 MG tablet Take 1 tablet (20 mg total) by mouth daily.  30 tablet  4  . sucralfate (CARAFATE) 1 GM/10ML suspension Take 1 g by mouth 4 (four) times daily -  with meals and at bedtime.        . Tamsulosin HCl (FLOMAX) 0.4 MG CAPS Take 1 capsule (0.4 mg total) by mouth daily after supper.  30 capsule  0  . topiramate (TOPAMAX) 50 MG tablet Take 50 mg by mouth at bedtime.       . triamterene-hydrochlorothiazide (DYAZIDE) 37.5-25 MG per capsule Take 1 capsule by mouth daily.      Marland Kitchen DISCONTD: omega-3 acid ethyl esters (LOVAZA) 1 G capsule Take 2 capsules (2 g total) by mouth 2 (two) times daily.  360 capsule  1  . budesonide (PULMICORT FLEXHALER) 180 MCG/ACT inhaler Inhale 1 puff into the lungs 2 (two) times daily.  1 each  5  . DISCONTD: acetaminophen-codeine (TYLENOL #3) 300-30 MG per tablet Take 1 tablet by mouth at bedtime as needed. For pain      . DISCONTD: ondansetron (ZOFRAN-ODT) 8 MG disintegrating tablet Take 1 tablet by mouth as needed.

## 2012-03-14 NOTE — Progress Notes (Signed)
Quick Note:  Called, spoke with pt. I informed her xray is stable; no pna per Dr. Delford Field. Advised he does want her to cont meds as prescribed at last OV - no changes at this time. She verbalized understanding of these results and recs and voiced no further questions/concerns at this time. ______

## 2012-03-31 ENCOUNTER — Ambulatory Visit: Payer: 59 | Admitting: Adult Health

## 2012-04-01 ENCOUNTER — Ambulatory Visit (INDEPENDENT_AMBULATORY_CARE_PROVIDER_SITE_OTHER): Payer: 59 | Admitting: Adult Health

## 2012-04-01 ENCOUNTER — Encounter: Payer: Self-pay | Admitting: Adult Health

## 2012-04-01 VITALS — BP 124/70 | HR 113 | Temp 98.1°F | Ht 62.0 in | Wt 221.0 lb

## 2012-04-01 DIAGNOSIS — J40 Bronchitis, not specified as acute or chronic: Secondary | ICD-10-CM

## 2012-04-01 MED ORDER — HYDROCODONE-HOMATROPINE 5-1.5 MG/5ML PO SYRP: 5.0000 mL | ORAL_SOLUTION | Freq: Four times a day (QID) | ORAL | Status: AC | PRN

## 2012-04-01 NOTE — Progress Notes (Signed)
Subjective:    Patient ID: [name], female    DOB: February 03, 1956, 56 y.o.   MRN: [id number]  HPI  56 y.o. WF Hx of gastroparesis and microaspiration.  03/13/2012 Not seen since 6/12, ? Asp pna. Pt got better on her own.  Then ill again two weeks ago, noted more wheeze and dyspnea.  Worse supine.  No heartburn.  No coughing after meals.  Cough is dry. No pndrip.  No chest pain.  10/12:  cxr clear. >rec to start pulmicort two puff twice daily, Stop fish oil/lovaza, Stay on Dexilant Strict reflux diet   04/01/12 Follow up  Returns for follow up . Still has cough. No discolored mucus.  Complains of  head congestion, PND, cough, clear mucus production x1 week.  Got some better last ov but never went totally away.  CXR showed NAD last ov.  Says she can not take abx unless an emergency- makes her gastroparesis worse.  No hemoptysis or chest pain . No edema.    Past Medical History  Diagnosis Date  . Fibromyalgia   . GERD (gastroesophageal reflux disease)   . Obstructive sleep apnea     On Cpap 2009  . Anxiety   . Depression   . Restless leg syndrome   . Edema   . Migraine   . Hyperlipidemia   . Anemia     previously followed by Dr. Dalene Carrow for anemia and elevated platelets  . Paresthesia     Dr. Antonietta Barcelona at [facility]  . Tremor     Dr. Antonietta Barcelona  . Gastroparesis     followed at [facility]  . Pneumonia     2012  . Plantar fasciitis 02/2011    R foot  . S/P endometrial ablation 08/09/2006    Novasure Ablation     Family History  Problem Relation Age of Onset  . Heart disease Mother     pacemaker  . Allergies Mother   . Hypertension Mother   . Heart disease Father   . Hypertension Father   . Diabetes Father   . Heart disease Paternal Grandmother   . Heart disease Paternal Grandfather   . Asthma Sister   . Irritable bowel syndrome Sister   . Allergies Sister   . Allergies Sister      History   Social History  . Marital Status: Married    Spouse Name: N/A   Number of Children: 2  . Years of Education: N/A   Occupational History  . customer service (on disability) [employer]   Social History Main Topics  . Smoking status: Never Smoker   . Smokeless tobacco: Never Used  . Alcohol Use: No  . Drug Use: No  . Sexually Active: Yes    Birth Control/ Protection: Post-menopausal   Other Topics Concern  . Not on file   Social History Narrative  . No narrative on file     Allergies  Allergen Reactions  . Erythromycin Nausea Only  . Tramadol Itching     Outpatient Prescriptions Prior to Visit  Medication Sig Dispense Refill  . albuterol (VENTOLIN HFA) 108 (90 BASE) MCG/ACT inhaler Inhale 2 puffs into the lungs every 4 (four) hours as needed. For shortness of breath      . ALPRAZolam (XANAX) 0.25 MG tablet Take 0.25 mg by mouth 2 (two) times daily as needed. For anxiety      . Armodafinil (NUVIGIL) 250 MG tablet Take 250 mg by mouth daily.       Marland Kitchen  Cetirizine HCl 10 MG CAPS Take 10 mg by mouth at bedtime.       . clonazePAM (KLONOPIN) 0.5 MG tablet Take 0.5-1 mg by mouth daily as needed. 1-2 tablets at bedtime      . dexlansoprazole (DEXILANT) 60 MG capsule Take 60 mg by mouth daily.       Marland Kitchen dronabinol (MARINOL) 5 MG capsule Take 10 mg by mouth 2 (two) times daily before a meal.       . DULoxetine (CYMBALTA) 30 MG capsule Take 60 mg by mouth 2 (two) times daily.       Marland Kitchen estradiol (VIVELLE-DOT) 0.05 MG/24HR Place 1 patch onto the skin 2 (two) times a week.      . fluticasone (FLONASE) 50 MCG/ACT nasal spray Place 2 sprays into the nose daily.  16 g  5  . hydrOXYzine (ATARAX/VISTARIL) 50 MG tablet You may use anywhere from 1/2 tablet, up to 2 tablets, every 6 hours as needed for itching.  May be more sedating at the higher doses  30 tablet  1  . ibuprofen (ADVIL,MOTRIN) 800 MG tablet Take 1 tablet (800 mg total) by mouth every 8 (eight) hours as needed for pain.  21 tablet  0  . methocarbamol (ROBAXIN) 500 MG tablet Take 500 mg by mouth 2  (two) times daily as needed. For pain      . Multiple Vitamin (MULTIVITAMIN PO) Take 2 each by mouth daily.        . NON FORMULARY Domperidone 20mg  four times daily       . nortriptyline (PAMELOR) 10 MG capsule Take 40 mg by mouth at bedtime.       . ondansetron (ZOFRAN) 4 MG tablet Take 4 mg by mouth 4 (four) times daily - after meals and at bedtime. As needed       . PERCOCET 5-325 MG per tablet Take 1-2 tablets by mouth every 4 (four) hours as needed for pain.  25 tablet  0  . pregabalin (LYRICA) 75 MG capsule Take 75 mg by mouth 4 (four) times daily.       . Probiotic Product (PHILLIPS COLON HEALTH) CAPS Take 1 capsule by mouth daily.      . progesterone (PROMETRIUM) 100 MG capsule Take 1 capsule (100 mg total) by mouth at bedtime.  30 capsule  11  . promethazine (PHENERGAN) 25 MG tablet Take 25 mg by mouth 4 (four) times daily as needed.       . rosuvastatin (CRESTOR) 20 MG tablet Take 1 tablet (20 mg total) by mouth daily.  30 tablet  4  . sucralfate (CARAFATE) 1 GM/10ML suspension Take 1 g by mouth 4 (four) times daily -  with meals and at bedtime.        . Tamsulosin HCl (FLOMAX) 0.4 MG CAPS Take 1 capsule (0.4 mg total) by mouth daily after supper.  30 capsule  0  . topiramate (TOPAMAX) 50 MG tablet Take 50 mg by mouth at bedtime.       . triamterene-hydrochlorothiazide (DYAZIDE) 37.5-25 MG per capsule Take 1 capsule by mouth daily.      Marland Kitchen omega-3 acid ethyl esters (LOVAZA) 1 G capsule Take 2 capsules (2 g total) by mouth 2 (two) times daily.  360 capsule  1  . acetaminophen-codeine (TYLENOL #3) 300-30 MG per tablet Take 1 tablet by mouth at bedtime as needed. For pain          Review of Systems  Constitutional:   No  weight loss, night sweats,  Fevers, chills, fatigue, or  lassitude.  HEENT:   No headaches,  Difficulty swallowing,  Tooth/dental problems, or  Sore throat,                No sneezing, itching, ear ache, nasal congestion, post nasal drip,   CV:  No chest pain,   Orthopnea, PND, swelling in lower extremities, anasarca, dizziness, palpitations, syncope.   GI  No heartburn, indigestion, abdominal pain, nausea, vomiting, diarrhea, change in bowel habits, loss of appetite, bloody stools.   Resp: No shortness of breath with exertion or at rest.  No excess mucus, no productive cough,  No non-productive cough,  No coughing up of blood.  No change in color of mucus.  No wheezing.  No chest wall deformity  Skin: no rash or lesions.  GU: no dysuria, change in color of urine, no urgency or frequency.  No flank pain, no hematuria   MS:  No joint pain or swelling.  No decreased range of motion.  No back pain.  Psych:  No change in mood or affect. No depression or anxiety.  No memory loss.         Objective:   Physical Exam                              Gen: Pleasant, well-nourished, in no distress,  normal affect  ENT: No lesions,  mouth clear,  oropharynx clear, no postnasal drip  Neck: No JVD, no TMG, no carotid bruits  Lungs: No use of accessory muscles, no dullness to percussion, coarse BS   Cardiovascular: RRR, heart sounds normal, no murmur or gallops, no peripheral edema  Abdomen: soft and NT, no HSM,  BS normal  Musculoskeletal: No deformities, no cyanosis or clubbing  Neuro: alert, non focal  Skin: Warm, no lesions or rashes        Assessment & Plan:

## 2012-04-01 NOTE — Patient Instructions (Addendum)
Mucinex DM Twice daily  As needed  Cough/congestion  Hydromet 1-2 tsp every 4-6 hr As needed  Cough-may make you sleepy.  GERD diet .  Saline rinses As needed   Fluids and rest .  Please contact office for sooner follow up if symptoms do not improve or worsen or seek emergency care  follow up Dr. Delford Field  In 6 weeks

## 2012-04-03 ENCOUNTER — Telehealth: Payer: Self-pay | Admitting: Adult Health

## 2012-04-03 DIAGNOSIS — J40 Bronchitis, not specified as acute or chronic: Secondary | ICD-10-CM | POA: Insufficient documentation

## 2012-04-03 MED ORDER — BENZONATATE 200 MG PO CAPS: 200.0000 mg | ORAL_CAPSULE | Freq: Three times a day (TID) | ORAL | Status: AC | PRN

## 2012-04-03 NOTE — Telephone Encounter (Signed)
Called, spoke with pt.  She was seen by TP on 04/01/12 and was instructed to: Patient Instructions     Mucinex DM Twice daily As needed Cough/congestion  Hydromet 1-2 tsp every 4-6 hr As needed Cough-may make you sleepy.  GERD diet .  Saline rinses As needed  Fluids and rest .  Please contact office for sooner follow up if symptoms do not improve or worsen or seek emergency care  follow up Dr. Delford Field In 6 weeks     Pt states last night she started itching "terrible," swelling some on feet and different places on her body, and had a rash that you couldn't really see but could feel.  She thinks this was from they hydromet.  Pt states she took benadryl which did help.  Reports the swelling and rash has cleared up but still itching some.  Pt also states she seems she is worse - c/o feeling more congested, feels like it's in her sinus more now, throat is hurting, right ear hurts, and eyes are itching.  Requesting TP's thoughts and recs - ? abx now?  Tammy, pls advise.  Thank you.

## 2012-04-03 NOTE — Telephone Encounter (Signed)
Stop hydromet  Use Delsym 2 tsp Twice daily  For cough  May have tessalon perles 200mg  #30 1 po Three times a day  As needed  Cough- may make you sleepy.  Please contact office for sooner follow up if symptoms do not improve or worsen or seek emergency care

## 2012-04-03 NOTE — Telephone Encounter (Signed)
I spoke with pt and is aware of TP recs. She would like the tessalon perles called in for her and i have sent rx. Nothing further was needed

## 2012-04-03 NOTE — Assessment & Plan Note (Addendum)
Slow to resolve flare w/ cough  Plan  Mucinex DM Twice daily  As needed  Cough/congestion  Hydromet 1-2 tsp every 4-6 hr As needed  Cough-may make you sleepy.  GERD diet .  Saline rinses As needed   Fluids and rest .  Please contact office for sooner follow up if symptoms do not improve or worsen or seek emergency care  follow up Dr. Delford Field  In 6 weeks

## 2012-05-12 ENCOUNTER — Ambulatory Visit: Payer: 59 | Admitting: Adult Health

## 2012-05-14 ENCOUNTER — Ambulatory Visit (INDEPENDENT_AMBULATORY_CARE_PROVIDER_SITE_OTHER): Payer: 59 | Admitting: Adult Health

## 2012-05-14 ENCOUNTER — Encounter: Payer: Self-pay | Admitting: Adult Health

## 2012-05-14 VITALS — BP 112/78 | HR 111 | Temp 98.7°F | Ht 63.0 in | Wt 224.6 lb

## 2012-05-14 DIAGNOSIS — J40 Bronchitis, not specified as acute or chronic: Secondary | ICD-10-CM

## 2012-05-14 NOTE — Assessment & Plan Note (Signed)
Recent flare now resolved  Cont on current regimen.  follow up Dr. Delford Field  In 2 months and As needed

## 2012-05-14 NOTE — Patient Instructions (Addendum)
Continue on current regimen  follow up Dr. Wright  In 2 months and As needed    

## 2012-05-14 NOTE — Progress Notes (Signed)
Subjective:    Patient ID: [name], female    DOB: 20-Jul-1956, 56 y.o.   MRN: [id number]  HPI  56 y.o. [place] Hx of gastroparesis and microaspiration.  03/13/2012 Not seen since 6/12, ? Asp pna. Pt got better on her own.  Then ill again two weeks ago, noted more wheeze and dyspnea.  Worse supine.  No heartburn.  No coughing after meals.  Cough is dry. No pndrip.  No chest pain.  10/12:  cxr clear. >rec to start pulmicort two puff twice daily, Stop fish oil/lovaza, Stay on Dexilant Strict reflux diet   04/01/12 Follow up  Returns for follow up . Still has cough. No discolored mucus.  Complains of  head congestion, PND, cough, clear mucus production x1 week.  Got some better last ov but never went totally away.  CXR showed NAD last ov.  Says she can not take abx unless an emergency- makes her gastroparesis worse.  No hemoptysis or chest pain . No edema.  >>Mucinex and Hydromet   05/14/2012 Follow up  Returns for follow up  Feeling better with decreased cough and dsypnea  Remains on Pulmicort Twice daily   No chest pain or edema.   Recently dx with C. Diff, starting abx therapy today .  Diarrhea on /off for last few weeks .   Past Medical History  Diagnosis Date  . Fibromyalgia   . GERD (gastroesophageal reflux disease)   . Obstructive sleep apnea     On Cpap 2009  . Anxiety   . Depression   . Restless leg syndrome   . Edema   . Migraine   . Hyperlipidemia   . Anemia     previously followed by Dr. Dalene Carrow for anemia and elevated platelets  . Paresthesia     Dr. Antonietta Barcelona at [facility]  . Tremor     Dr. Antonietta Barcelona  . Gastroparesis     followed at [facility]  . Pneumonia     2012  . Plantar fasciitis 02/2011    R foot  . S/P endometrial ablation 08/09/2006    Novasure Ablation     Family History  Problem Relation Age of Onset  . Heart disease Mother     pacemaker  . Allergies Mother   . Hypertension Mother   . Heart disease Father   . Hypertension Father   .  Diabetes Father   . Heart disease Paternal Grandmother   . Heart disease Paternal Grandfather   . Asthma Sister   . Irritable bowel syndrome Sister   . Allergies Sister   . Allergies Sister      History   Social History  . Marital Status: Married    Spouse Name: N/A    Number of Children: 2  . Years of Education: N/A   Occupational History  . customer service (on disability) [employer]   Social History Main Topics  . Smoking status: Never Smoker   . Smokeless tobacco: Never Used  . Alcohol Use: No  . Drug Use: No  . Sexually Active: Yes    Birth Control/ Protection: Post-menopausal   Other Topics Concern  . Not on file   Social History Narrative  . No narrative on file     Allergies  Allergen Reactions  . Erythromycin Nausea Only  . Tramadol Itching     Outpatient Prescriptions Prior to Visit  Medication Sig Dispense Refill  . albuterol (VENTOLIN HFA) 108 (90 BASE) MCG/ACT inhaler Inhale 2 puffs into  the lungs every 4 (four) hours as needed. For shortness of breath      . ALPRAZolam (XANAX) 0.25 MG tablet Take 0.25 mg by mouth 2 (two) times daily as needed. For anxiety      . Armodafinil (NUVIGIL) 250 MG tablet Take 250 mg by mouth daily.       . Cetirizine HCl 10 MG CAPS Take 10 mg by mouth at bedtime.       . clonazePAM (KLONOPIN) 0.5 MG tablet Take 0.5-1 mg by mouth daily as needed. 1-2 tablets at bedtime      . dexlansoprazole (DEXILANT) 60 MG capsule Take 60 mg by mouth daily.       Marland Kitchen dronabinol (MARINOL) 5 MG capsule Take 10 mg by mouth 2 (two) times daily before a meal.       . DULoxetine (CYMBALTA) 30 MG capsule Take 60 mg by mouth 2 (two) times daily.       Marland Kitchen estradiol (VIVELLE-DOT) 0.05 MG/24HR Place 1 patch onto the skin 2 (two) times a week.      . fluticasone (FLONASE) 50 MCG/ACT nasal spray Place 2 sprays into the nose daily.  16 g  5  . hydrOXYzine (ATARAX/VISTARIL) 50 MG tablet You may use anywhere from 1/2 tablet, up to 2 tablets, every 6 hours  as needed for itching.  May be more sedating at the higher doses  30 tablet  1  . ibuprofen (ADVIL,MOTRIN) 800 MG tablet Take 1 tablet (800 mg total) by mouth every 8 (eight) hours as needed for pain.  21 tablet  0  . methocarbamol (ROBAXIN) 500 MG tablet Take 500 mg by mouth 2 (two) times daily as needed. For pain      . Multiple Vitamin (MULTIVITAMIN PO) Take 2 each by mouth daily.        . NON FORMULARY Domperidone 20mg  four times daily       . nortriptyline (PAMELOR) 10 MG capsule Take 40 mg by mouth at bedtime.       . ondansetron (ZOFRAN) 4 MG tablet Take 4 mg by mouth 4 (four) times daily - after meals and at bedtime. As needed       . PERCOCET 5-325 MG per tablet Take 1-2 tablets by mouth every 4 (four) hours as needed for pain.  25 tablet  0  . pregabalin (LYRICA) 75 MG capsule Take 75 mg by mouth 4 (four) times daily.       . Probiotic Product (PHILLIPS COLON HEALTH) CAPS Take 1 capsule by mouth daily.      . progesterone (PROMETRIUM) 100 MG capsule Take 1 capsule (100 mg total) by mouth at bedtime.  30 capsule  11  . promethazine (PHENERGAN) 25 MG tablet Take 25 mg by mouth 4 (four) times daily as needed.       . rosuvastatin (CRESTOR) 20 MG tablet Take 1 tablet (20 mg total) by mouth daily.  30 tablet  4  . sucralfate (CARAFATE) 1 GM/10ML suspension Take 1 g by mouth 4 (four) times daily -  with meals and at bedtime.        . Tamsulosin HCl (FLOMAX) 0.4 MG CAPS Take 1 capsule (0.4 mg total) by mouth daily after supper.  30 capsule  0  . topiramate (TOPAMAX) 50 MG tablet Take 50 mg by mouth at bedtime.       . triamterene-hydrochlorothiazide (DYAZIDE) 37.5-25 MG per capsule Take 1 capsule by mouth daily.      Marland Kitchen omega-3 acid  ethyl esters (LOVAZA) 1 G capsule Take 2 capsules (2 g total) by mouth 2 (two) times daily.  360 capsule  1  . acetaminophen-codeine (TYLENOL #3) 300-30 MG per tablet Take 1 tablet by mouth at bedtime as needed. For pain          Review of Systems    Constitutional:   No  weight loss, night sweats,  Fevers, chills, + fatigue, or  lassitude.  HEENT:   No headaches,  Difficulty swallowing,  Tooth/dental problems, or  Sore throat,                No sneezing, itching, ear ache, nasal congestion, post nasal drip,   CV:  No chest pain,  Orthopnea, PND, swelling in lower extremities, anasarca, dizziness, palpitations, syncope.   GI  No heartburn, indigestion,  loss of appetite, bloody stools.   Resp:      No non-productive cough,  No coughing up of blood.  No change in color of mucus.  No wheezing.  No chest wall deformity  Skin: no rash or lesions.  GU: no dysuria, change in color of urine, no urgency or frequency.  No flank pain, no hematuria   MS:  No joint pain or swelling.  No decreased range of motion.  No back pain.  Psych:  No change in mood or affect. No depression or anxiety.  No memory loss.         Objective:   Physical Exam Gen: Pleasant, well-nourished, in no distress,  normal affect  ENT: No lesions,  mouth clear,  oropharynx clear, no postnasal drip  Neck: No JVD, no TMG, no carotid bruits  Lungs: No use of accessory muscles, no dullness to percussion, coarse BS   Cardiovascular: RRR, heart sounds normal, no murmur or gallops, no peripheral edema  Abdomen: soft and NT, no HSM,  BS normal  Musculoskeletal: No deformities, no cyanosis or clubbing  Neuro: alert, non focal  Skin: Warm, no lesions or rashes        Assessment & Plan:

## 2012-05-19 ENCOUNTER — Other Ambulatory Visit: Payer: Self-pay | Admitting: Family Medicine

## 2012-05-19 NOTE — Telephone Encounter (Signed)
Okay to refill #10, no refill (we have filled for her in past, last 3/9 per computer)

## 2012-05-19 NOTE — Telephone Encounter (Signed)
Is this okay to refill? 

## 2012-06-23 ENCOUNTER — Other Ambulatory Visit: Payer: Self-pay | Admitting: Family Medicine

## 2012-06-23 NOTE — Telephone Encounter (Signed)
Last discussed a year ago (06/2011); Recommend med check/OV, especially since she has so many other providers who rx meds.  Okay to refill #30, no refills, but schedule med check

## 2012-06-23 NOTE — Telephone Encounter (Signed)
Spoke with patient and she will have to call me back to schedule med check as she was driving.

## 2012-06-23 NOTE — Telephone Encounter (Signed)
Is this okay to refill? 

## 2012-07-15 ENCOUNTER — Ambulatory Visit: Payer: 59 | Admitting: Critical Care Medicine

## 2012-07-17 ENCOUNTER — Encounter: Payer: Self-pay | Admitting: Family Medicine

## 2012-07-17 ENCOUNTER — Ambulatory Visit (INDEPENDENT_AMBULATORY_CARE_PROVIDER_SITE_OTHER): Payer: 59 | Admitting: Family Medicine

## 2012-07-17 VITALS — BP 138/86 | HR 68 | Ht 62.5 in | Wt 217.0 lb

## 2012-07-17 DIAGNOSIS — E7800 Pure hypercholesterolemia, unspecified: Secondary | ICD-10-CM

## 2012-07-17 DIAGNOSIS — E785 Hyperlipidemia, unspecified: Secondary | ICD-10-CM

## 2012-07-17 DIAGNOSIS — Z79899 Other long term (current) drug therapy: Secondary | ICD-10-CM

## 2012-07-17 DIAGNOSIS — G2581 Restless legs syndrome: Secondary | ICD-10-CM

## 2012-07-17 DIAGNOSIS — F329 Major depressive disorder, single episode, unspecified: Secondary | ICD-10-CM

## 2012-07-17 NOTE — Patient Instructions (Signed)
I recommend that you get back into counseling.  You can discuss with Dr. Nolen Mu. I will talk with Dr. Delphia Grates regarding the clonazepam prescription and see what their issue is regarding why you need to pick up in person.  Check in with Dr. Delphia Grates regarding the Alfonso Patten that he had you stop.  That was likely as a precaution due to starting a new sedating medication (nortriptylene).  Since it is working for your neuropathy, but you are still having trouble sleeping, it is likely okay to restart Lunesta--run it by him since he is the one who told you to stop it.

## 2012-07-17 NOTE — Progress Notes (Signed)
Chief Complaint  Patient presents with  . Hyperlipidemia    med check.   HPI:  "I feel like I'm at the end of my rope and having trouble handling it."  She was sick x 2 weeks with vomiting, about a month ago.  Feeling unmotivated.  Starts out unmotivated due to the nausea, but is also feeling more down.  She lost her best friend in June.  She has found out her long-term disability has been cut back.  Still waiting for social security.    Intermittent problems with diarrhea, started having accidents.  Treated for C.diff in April, also had kidney stone in April.  Last saw GI 2 weeks ago.  Re-tested for C.diff, negative, but still having fecal incontinence.  Getting set up for additional testing to determine why.  Upset that she is having accidents, has to bring change of clothes everywhere she goes.  She isn't getting any sleep.  Dr. Delphia Grates put her on nortiptylene for neuropathy, and stopped the Lunesta that Dr. Corliss Skains has prescribed. She hasn't slept well since stopping the Lunesta.  Reports that the neuropathy pain has improved.  Sees Dr. Nolen Mu for her depression/anxiety.  Previously saw therapist in [place] through her GI office. Hasn't had counseling in a while.  She is requesting refill on her clonazepam, which is prescribed by Dr. Delphia Grates for her RLS.  She reports that Dr. Delphia Grates will not call in or put refills on rx, makes her pick it up from the office each month.  It is too far, so was wanting if she could pick up from here.  She uses hydroxyzine for itching--benadryl doesn't help. Doesn't have rash, but feels itchy all over.  Usually is at night, sometimes during the day.  Doesn't need it every day, just occasionally  This was filled for her last month, and she reports still having plenty.  Also gets triamterene here--takes for edema, not for hypertension.  Takes it daily. She gets recurrent swelling if she doesn't take it (ie she won't take it if has to travel, doctor's appointments,  for fear of urinary incontinence)  Allergies--doing well right now, on Flonase.  Had to see Dr. Delford Field when had shortness of breath, and was put on albuterol.  Doing fine now, not needing the inhaler.    Past Medical History  Diagnosis Date  . Fibromyalgia   . GERD (gastroesophageal reflux disease)   . Obstructive sleep apnea     On Cpap 2009  . Anxiety   . Depression   . Restless leg syndrome   . Edema   . Migraine   . Hyperlipidemia   . Anemia     previously followed by Dr. Dalene Carrow for anemia and elevated platelets  . Paresthesia     Dr. Antonietta Barcelona at [facility]  . Tremor     Dr. Antonietta Barcelona  . Gastroparesis     followed at [facility]  . Pneumonia     2012  . Plantar fasciitis 02/2011    R foot  . S/P endometrial ablation 08/09/2006    Novasure Ablation   Past Surgical History  Procedure Date  . Tonsillectomy 1968  . Knee surgery 1999    R knee, Dr. Renae Fickle, torn cartilage  . Cholecystectomy 9/05  . Endometrial ablation 08/09/2006    Dr. Kingsley Plan Ablation   History   Social History  . Marital Status: Married    Spouse Name: N/A    Number of Children: 2  . Years of Education: N/A  Occupational History  . customer service (on disability) [employer]   Social History Main Topics  . Smoking status: Never Smoker   . Smokeless tobacco: Never Used  . Alcohol Use: No  . Drug Use: No  . Sexually Active: Yes    Birth Control/ Protection: Post-menopausal   Other Topics Concern  . Not on file   Social History Narrative  . No narrative on file    Current Outpatient Prescriptions on File Prior to Visit  Medication Sig Dispense Refill  . albuterol (VENTOLIN HFA) 108 (90 BASE) MCG/ACT inhaler Inhale 2 puffs into the lungs every 4 (four) hours as needed. For shortness of breath      . ALPRAZolam (XANAX) 0.25 MG tablet Take 0.25 mg by mouth 2 (two) times daily as needed. For anxiety      . Armodafinil (NUVIGIL) 250 MG tablet Take 250 mg by mouth daily.       .  budesonide (PULMICORT FLEXHALER) 180 MCG/ACT inhaler Inhale 1 puff into the lungs 2 (two) times daily.  1 each  5  . Cetirizine HCl 10 MG CAPS Take 10 mg by mouth at bedtime.       . clonazePAM (KLONOPIN) 0.5 MG tablet Take 0.5-1 mg by mouth daily as needed. 1-2 tablets at bedtime      . COMPRO 25 MG suppository as needed.      . Cream Base (PCCA LIPODERM BASE) CREA Apply as directed prn for inflammation and pain to affected areas      . dexlansoprazole (DEXILANT) 60 MG capsule Take 60 mg by mouth daily.       Marland Kitchen dronabinol (MARINOL) 5 MG capsule Take 10 mg by mouth 2 (two) times daily before a meal.       . DULoxetine (CYMBALTA) 30 MG capsule Take 60 mg by mouth 2 (two) times daily.       Marland Kitchen estradiol (VIVELLE-DOT) 0.05 MG/24HR Place 1 patch onto the skin 2 (two) times a week.      . fluticasone (FLONASE) 50 MCG/ACT nasal spray Place 2 sprays into the nose daily.  16 g  5  . hydrOXYzine (ATARAX/VISTARIL) 50 MG tablet TAKE 1/2 TO 2 TABLETS EVERY 6 HOURS AS NEEDED FOR ITCHING  30 tablet  0  . methocarbamol (ROBAXIN) 500 MG tablet Take 500 mg by mouth 2 (two) times daily as needed. For pain      . Multiple Vitamin (MULTIVITAMIN PO) Take 2 each by mouth daily.        . NON FORMULARY Domperidone 20mg  four times daily       . nortriptyline (PAMELOR) 10 MG capsule Take 30 mg by mouth at bedtime.       . ondansetron (ZOFRAN-ODT) 8 MG disintegrating tablet Take 1 tablet by mouth Once daily as needed.      . Probiotic Product (PHILLIPS COLON HEALTH) CAPS Take 1 capsule by mouth daily.      . prochlorperazine (COMPAZINE) 10 MG tablet Take 1 tablet by mouth Once daily as needed.      . progesterone (PROMETRIUM) 100 MG capsule Take 1 capsule (100 mg total) by mouth at bedtime.  30 capsule  11  . rosuvastatin (CRESTOR) 20 MG tablet Take 1 tablet (20 mg total) by mouth daily.  30 tablet  4  . Tamsulosin HCl (FLOMAX) 0.4 MG CAPS Take 1 capsule (0.4 mg total) by mouth daily after supper.  30 capsule  0  .  topiramate (TOPAMAX) 50 MG tablet Take  50 mg by mouth at bedtime.       . triamterene-hydrochlorothiazide (DYAZIDE) 37.5-25 MG per capsule Take 1 capsule by mouth daily.      . sucralfate (CARAFATE) 1 GM/10ML suspension Take 1 g by mouth 4 (four) times daily -  with meals and at bedtime.        . SUMAtriptan (IMITREX) 50 MG tablet TAKE 1 TABLET (50 MG TOTAL) BY MOUTH EVERY 2 (TWO) HOURS AS NEEDED FOR MIGRAINE. MAX 200 MG/DAY  10 tablet  0  . DISCONTD: carbamazepine (TEGRETOL XR) 100 MG 12 hr tablet Take 200 mg by mouth daily.        Marland Kitchen DISCONTD: Eszopiclone 3 MG TABS Take 3 mg by mouth at bedtime. Take immediately before bedtime       Allergies  Allergen Reactions  . Erythromycin Nausea Only  . Tramadol Itching   ROS:  Occasional edema (usually if she doesn't take diuretic because has appts).  Denies fevers, URI symptoms, cough, shortness of breath, chest pain.  +nausea, vomiting, diarrhea.  Denies further problems with kidney stones, dysuria, +frequency.  Denies skin rash, just intermittent itching.  +depression, +fatigue, +insomnia.  PHYSICAL EXAM: BP 138/86  Pulse 68  Ht 5' 2.5" (1.588 m)  Wt 217 lb (98.431 kg)  BMI 39.06 kg/m2  LMP 07/27/2006 Tearful female with flat affect.  She doesn't appear to be in any distress, just very depressed. HEENT: PERRL, EOMI, conjunctiva clear.  OP clear Neck: no lymphadenopathy or mass Heart: regular rate and rhythm without murmur Lungs: clear bilaterally Abdomen: Mild tenderness at RUQ, and some guarding epigastric and RUQ. No organomegaly or mass Extremities: No edema, normal pulses Psych: as above, depressed.  Normal hygiene, grooming, eye contact, speech Neuro: alert and oriented,  Normal gait, cranial nerves grossly intact  ASSESSMENT/PLAN:  1. Pure hypercholesterolemia  Lipid panel  2. Encounter for long-term (current) use of other medications  Comprehensive metabolic panel  3. Restless leg syndrome    4. Depressive disorder, not elsewhere  classified    5. Hyperlipidemia      Depression:  Poorly controlled, exacerbated due to decrease in quality of life related to recent flare of illness and now fecal incontinence. F/u with Dr. Nolen Mu, and strongly encouraged counseling.  Can get recommendation from Dr. Nolen Mu for a closer counselor, vs going back to counselor in [place].  RLS and request for clonazepam through my office.  I will call Dr. Delphia Grates to clarify issue with clonazepam rx/refills, as to why they make her pick up written rx  Pt to call Dr. Delphia Grates to make sure okay to go back to [facility].  Since it appears that it is helping at 30mg  nightly for her neuropathy, and she isn't sleeping, likely is okay to use as needed, but she should clear this with him, given her multiple providers and very complicated list of medications.   Return fasting for labs--c-met, lipids  Refill Crestor after labs back, if appropriate.  30 min visit, more than 1/2 counseling.

## 2012-07-18 ENCOUNTER — Telehealth: Payer: Self-pay | Admitting: Family Medicine

## 2012-07-18 ENCOUNTER — Encounter: Payer: Self-pay | Admitting: Family Medicine

## 2012-07-18 NOTE — Telephone Encounter (Signed)
I spoke with Dr. Antonietta Barcelona regarding patient's concern about having to go to his office in [facility] every month to get her clonazepam prescription.  It turns out that is his policy for ALL controlled substances.  He has no problem with me writing this for her, for her convenience, and will keep [place] updated with his visits (he had been faxing to [place] at [facility]  Okay to refill her clonazepam for 3 months (#60 with 2 refills). Please call patient and let her know this.  Also let her know that we spoke about her trouble sleeping, and her lunesta.  He would prefer that she try an increased dose of clonazepam (ie 3 tablets, 1.5 mg, at bedtime, and if ineffective can take as much at 4 tablets (2mg ) at bedtime) at bedtime to see if it helps with sleep, rather than restarting her Lunesta, to try and minimize her medications.  If she doesn't get improved sleep with higher dose of clonazepam, then it might be necessary to go back to [place], but he wanted to try and avoid this now. If she gets relief from higher dose, then we can change the dose/quantity of her subsequent clonazepam rx

## 2012-07-21 ENCOUNTER — Other Ambulatory Visit: Payer: Self-pay | Admitting: *Deleted

## 2012-07-21 DIAGNOSIS — F419 Anxiety disorder, unspecified: Secondary | ICD-10-CM

## 2012-07-21 MED ORDER — CLONAZEPAM 0.5 MG PO TABS: 0.5000 mg | ORAL_TABLET | Freq: Every day | ORAL | Status: DC | PRN

## 2012-07-21 NOTE — Telephone Encounter (Signed)
Called in patient's clonazepam 0.5-1mg  #60 with 2 refills. Went over [facility] instructions in detail. She will try to take up to 2mg  per night of the clonazepam and call Dr.Knapp in a few weeks with an update of whether or not we need to call in more clonazepam as she is using more or do we need to talk to Dr.Knapp about Lunesta. Pt verbalized understanding.

## 2012-07-22 ENCOUNTER — Other Ambulatory Visit: Payer: 59

## 2012-07-22 DIAGNOSIS — E7800 Pure hypercholesterolemia, unspecified: Secondary | ICD-10-CM

## 2012-07-22 DIAGNOSIS — Z79899 Other long term (current) drug therapy: Secondary | ICD-10-CM

## 2012-07-22 LAB — COMPREHENSIVE METABOLIC PANEL
ALT: 29 U/L (ref 0–35)
AST: 23 U/L (ref 0–37)
Albumin: 3.9 g/dL (ref 3.5–5.2)
Alkaline Phosphatase: 115 U/L (ref 39–117)
BUN: 16 mg/dL (ref 6–23)
CO2: 30 mEq/L (ref 19–32)
Calcium: 9.8 mg/dL (ref 8.4–10.5)
Chloride: 101 mEq/L (ref 96–112)
Creat: 0.98 mg/dL (ref 0.50–1.10)
Glucose, Bld: 117 mg/dL — ABNORMAL HIGH (ref 70–99)
Potassium: 4.1 mEq/L (ref 3.5–5.3)
Sodium: 140 mEq/L (ref 135–145)
Total Bilirubin: 0.3 mg/dL (ref 0.3–1.2)
Total Protein: 7 g/dL (ref 6.0–8.3)

## 2012-07-22 LAB — LIPID PANEL
Cholesterol: 181 mg/dL (ref 0–200)
HDL: 57 mg/dL (ref >39)
LDL Cholesterol: 101 mg/dL — ABNORMAL HIGH (ref 0–99)
Total CHOL/HDL Ratio: 3.2 Ratio
Triglycerides: 114 mg/dL (ref <150)
VLDL: 23 mg/dL (ref 0–40)

## 2012-07-23 ENCOUNTER — Encounter: Payer: Self-pay | Admitting: Family Medicine

## 2012-07-23 DIAGNOSIS — R7301 Impaired fasting glucose: Secondary | ICD-10-CM | POA: Insufficient documentation

## 2012-07-31 ENCOUNTER — Telehealth: Payer: Self-pay | Admitting: Internal Medicine

## 2012-08-01 ENCOUNTER — Other Ambulatory Visit: Payer: Self-pay | Admitting: *Deleted

## 2012-08-01 DIAGNOSIS — I10 Essential (primary) hypertension: Secondary | ICD-10-CM

## 2012-08-01 MED ORDER — TRIAMTERENE-HCTZ 37.5-25 MG PO CAPS: 1.0000 | ORAL_CAPSULE | Freq: Every day | ORAL | Status: DC

## 2012-08-01 NOTE — Telephone Encounter (Signed)
done

## 2012-08-01 NOTE — Telephone Encounter (Signed)
Done

## 2012-08-12 ENCOUNTER — Ambulatory Visit: Payer: 59 | Admitting: Critical Care Medicine

## 2012-08-13 ENCOUNTER — Other Ambulatory Visit: Payer: 59

## 2012-08-29 ENCOUNTER — Other Ambulatory Visit: Payer: Self-pay | Admitting: Family Medicine

## 2012-09-02 ENCOUNTER — Telehealth: Payer: Self-pay | Admitting: Internal Medicine

## 2012-09-02 NOTE — Telephone Encounter (Signed)
Faxed medical records to [facility]

## 2012-09-03 ENCOUNTER — Other Ambulatory Visit (INDEPENDENT_AMBULATORY_CARE_PROVIDER_SITE_OTHER): Payer: 59

## 2012-09-03 DIAGNOSIS — Z23 Encounter for immunization: Secondary | ICD-10-CM

## 2012-10-01 DIAGNOSIS — A0472 Enterocolitis due to Clostridium difficile, not specified as recurrent: Secondary | ICD-10-CM

## 2012-10-01 HISTORY — DX: Enterocolitis due to Clostridium difficile, not specified as recurrent: A04.72

## 2012-10-08 ENCOUNTER — Encounter: Payer: Self-pay | Admitting: *Deleted

## 2012-10-22 ENCOUNTER — Other Ambulatory Visit: Payer: Self-pay | Admitting: Family Medicine

## 2012-11-05 DIAGNOSIS — D72829 Elevated white blood cell count, unspecified: Secondary | ICD-10-CM | POA: Insufficient documentation

## 2012-11-06 DIAGNOSIS — D473 Essential (hemorrhagic) thrombocythemia: Secondary | ICD-10-CM | POA: Insufficient documentation

## 2012-11-06 DIAGNOSIS — E611 Iron deficiency: Secondary | ICD-10-CM | POA: Insufficient documentation

## 2012-11-06 DIAGNOSIS — D75839 Thrombocytosis, unspecified: Secondary | ICD-10-CM | POA: Insufficient documentation

## 2012-11-13 ENCOUNTER — Telehealth: Payer: Self-pay | Admitting: Internal Medicine

## 2012-11-13 MED ORDER — SUMATRIPTAN SUCCINATE 50 MG PO TABS: ORAL_TABLET | ORAL | Status: DC

## 2012-11-13 NOTE — Telephone Encounter (Signed)
Patient advised.

## 2012-11-13 NOTE — Telephone Encounter (Signed)
advise pt rx refilled.

## 2012-12-04 LAB — HM COLONOSCOPY

## 2012-12-24 ENCOUNTER — Other Ambulatory Visit: Payer: Self-pay | Admitting: Family Medicine

## 2012-12-24 DIAGNOSIS — Z1231 Encounter for screening mammogram for malignant neoplasm of breast: Secondary | ICD-10-CM

## 2012-12-31 ENCOUNTER — Encounter: Payer: Self-pay | Admitting: Family Medicine

## 2012-12-31 ENCOUNTER — Ambulatory Visit (INDEPENDENT_AMBULATORY_CARE_PROVIDER_SITE_OTHER): Payer: 59 | Admitting: Family Medicine

## 2012-12-31 VITALS — BP 106/70 | HR 80 | Temp 97.8°F | Ht 62.5 in | Wt 218.0 lb

## 2012-12-31 DIAGNOSIS — J069 Acute upper respiratory infection, unspecified: Secondary | ICD-10-CM

## 2012-12-31 NOTE — Patient Instructions (Addendum)
Check which type of Robitussin you have.  I want you to be getting guaifenesin (expectorant to help keep phlegm loose).  If it also contains a decongestant ("sinus"), then you cannot take the Advil Cold and Sinus with it.  Okay to use a decongestant as needed (but make sure you are only getting it from one medication).  Call early next week if you continue to have discolored mucus, and/or worsening sinus pain, fever, etc for antibiotics.

## 2012-12-31 NOTE — Progress Notes (Signed)
Chief Complaint  Patient presents with  . Nasal Congestion    has been blowing her nose a lot, mostly clear but today a bit yellow. Started last Saturday with ST and ear fullness, cough started today.    HPI: 4 days ago started with sore throat, then runny nose, and now is coughing.  She thought it was sinus and allergies, but is getting worse despite using OTC Advil Cold and Sinus medication, so presents today for evaluation.  Denies fevers. Nasal mucus is mostly clear, slightly discolored today.  Cough is nonproductive.  Also took some Robitussin this morning, can't yet tell if working.  Husband was sick 2 weeks ago, no other ill contacts.  Past Medical History  Diagnosis Date  . Fibromyalgia   . GERD (gastroesophageal reflux disease)   . Obstructive sleep apnea     On Cpap 2009  . Anxiety   . Depression   . Restless leg syndrome   . Edema   . Migraine   . Hyperlipidemia   . Anemia     previously followed by Dr. Dalene Carrow for anemia and elevated platelets  . Paresthesia     Dr. Antonietta Barcelona at [facility]  . Tremor     Dr. Antonietta Barcelona  . Gastroparesis     followed at [facility]  . Pneumonia     2012  . Plantar fasciitis 02/2011    R foot  . S/P endometrial ablation 08/09/2006    Novasure Ablation  . C. difficile colitis 10/01/12    treated by [facility]  . Dyssynergia     dyssynergenic defecation, contributing to fecal incontinence.   Past Surgical History  Procedure Date  . Tonsillectomy 1968  . Knee surgery 1999    R knee, Dr. Renae Fickle, torn cartilage  . Cholecystectomy 9/05  . Endometrial ablation 08/09/2006    Dr. Kingsley Plan Ablation   History   Social History  . Marital Status: Married    Spouse Name: N/A    Number of Children: 2  . Years of Education: N/A   Occupational History  . customer service (on disability) [employer]   Social History Main Topics  . Smoking status: Never Smoker   . Smokeless tobacco: Never Used  . Alcohol Use: No  . Drug Use: No  .  Sexually Active: Yes    Birth Control/ Protection: Post-menopausal   Other Topics Concern  . Not on file   Social History Narrative  . No narrative on file   Current Outpatient Prescriptions on File Prior to Visit  Medication Sig Dispense Refill  . ALPRAZolam (XANAX) 0.25 MG tablet Take 0.25 mg by mouth 2 (two) times daily as needed. For anxiety      . ARIPiprazole (ABILIFY) 2 MG tablet Take 2 mg by mouth daily.      . Armodafinil (NUVIGIL) 250 MG tablet Take 250 mg by mouth daily.       . Cetirizine HCl 10 MG CAPS Take 10 mg by mouth at bedtime.       . clonazePAM (KLONOPIN) 0.5 MG tablet Take 1-2 tablets (0.5-1 mg total) by mouth daily as needed. 1-2 tablets at bedtime  60 tablet  2  . CRESTOR 20 MG tablet TAKE 1 TABLET BY MOUTH EVERY DAY  30 tablet  2  . dexlansoprazole (DEXILANT) 60 MG capsule Take 60 mg by mouth daily.       Marland Kitchen dronabinol (MARINOL) 5 MG capsule Take 10 mg by mouth 2 (two) times daily before a  meal.       . DULoxetine (CYMBALTA) 30 MG capsule Take 60 mg by mouth 2 (two) times daily.       Marland Kitchen estradiol (VIVELLE-DOT) 0.05 MG/24HR Place 1 patch onto the skin 2 (two) times a week.      . ferrous sulfate 325 (65 FE) MG EC tablet Take 325 mg by mouth 3 (three) times daily with meals.      . fluticasone (FLONASE) 50 MCG/ACT nasal spray Place 2 sprays into the nose daily.  16 g  5  . hydrOXYzine (ATARAX/VISTARIL) 50 MG tablet TAKE 1/2 TO 2 TABLETS EVERY 6 HOURS AS NEEDED FOR ITCHING  30 tablet  0  . methocarbamol (ROBAXIN) 500 MG tablet Take 500 mg by mouth 2 (two) times daily as needed. For pain      . Multiple Vitamin (MULTIVITAMIN PO) Take 2 each by mouth daily.        . nortriptyline (PAMELOR) 10 MG capsule Take 40 mg by mouth at bedtime.       . pregabalin (LYRICA) 50 MG capsule Take 50 mg by mouth 2 (two) times daily.      . Probiotic Product (PHILLIPS COLON HEALTH) CAPS Take 1 capsule by mouth daily.      . progesterone (PROMETRIUM) 100 MG capsule Take 1 capsule (100 mg  total) by mouth at bedtime.  30 capsule  11  . promethazine (PHENERGAN) 25 MG suppository Place 25 mg rectally as needed.      . propranolol (INDERAL) 20 MG tablet Take 20 mg by mouth daily.      . sucralfate (CARAFATE) 1 GM/10ML suspension Take 1 g by mouth 4 (four) times daily -  with meals and at bedtime.        . Tamsulosin HCl (FLOMAX) 0.4 MG CAPS Take 1 capsule (0.4 mg total) by mouth daily after supper.  30 capsule  0  . topiramate (TOPAMAX) 50 MG tablet Take 50 mg by mouth at bedtime.       . triamterene-hydrochlorothiazide (DYAZIDE) 37.5-25 MG per capsule Take 1 each (1 capsule total) by mouth daily.  90 capsule  1  . albuterol (VENTOLIN HFA) 108 (90 BASE) MCG/ACT inhaler Inhale 2 puffs into the lungs every 4 (four) hours as needed. For shortness of breath      . prochlorperazine (COMPAZINE) 10 MG tablet Take 1 tablet by mouth Once daily as needed.      . SUMAtriptan (IMITREX) 50 MG tablet TAKE 1 TABLET (50 MG TOTAL) BY MOUTH EVERY 2 (TWO) HOURS AS NEEDED FOR MIGRAINE. MAX 200 MG/DAY  10 tablet  0  . [DISCONTINUED] carbamazepine (TEGRETOL XR) 100 MG 12 hr tablet Take 200 mg by mouth daily.        . [DISCONTINUED] Eszopiclone 3 MG TABS Take 3 mg by mouth at bedtime. Take immediately before bedtime       Allergies  Allergen Reactions  . Erythromycin Nausea Only  . Tramadol Itching   ROS: Chronic nausea, dry heaves.  Denies diarrhea, bowels controlled by "colon health".  Denies skin rash, bleeding/bruising, myalgias, arthralgias. No urinary complaints, headaches, dizziness, chest pain, shortness of breath.  PHYSICAL EXAM: BP 106/70  Pulse 80  Temp 97.8 F (36.6 C) (Oral)  Ht 5' 2.5" (1.588 m)  Wt 218 lb (98.884 kg)  BMI 39.24 kg/m2  LMP 07/27/2006  Pleasant female, with constant sniffling and hoarse voice, in no distress.  Occasional dry cough HEENT:  PERRL, EOMI, conjunctiva clear.  TM's and EAC's  normal.  OP clear.  Nasal mucosa mildly edematous, light yellow crust and clear  mucus noted.  No erythema, a little more inflamed on the left than on the right.  Mildly tender at R maxillary sinus.   Neck: no lymphadenopathy or mass Heart: regular rate and rhythm without murmur Lungs: clear bilaterally.  Good air movement. No wheezes, rales or ronchi Skin: no rashes  ASSESSMENT/PLAN:  1. URI (upper respiratory infection)     URI.  Doubt early sinusitis.  If symptoms persist or worsen, call for ABX.  For now, supportive measures--reviewed guaifenesin, sinus rinses.  Decongestants as needed.  Continue daily zyrtec.   Has f/u scheduled 2/17--check chart to see if she previously took/tolerated lipitor.  crestor may not be covered after March (pt brought in letter stating this, stating that less expensive med options included atorvastatin, as well as other weaker generic statins).

## 2013-01-07 ENCOUNTER — Telehealth: Payer: Self-pay | Admitting: Internal Medicine

## 2013-01-07 MED ORDER — AMOXICILLIN 500 MG PO CAPS: 1000.0000 mg | ORAL_CAPSULE | Freq: Two times a day (BID) | ORAL | Status: DC

## 2013-01-07 NOTE — Telephone Encounter (Signed)
Patient advised.

## 2013-01-07 NOTE — Telephone Encounter (Signed)
Advise pt--antibiotics were sent to her pharmacy.  She needs to continue with supportive measures we reviewed for cough, runny nose.  Antibiotics will help treat the infection

## 2013-01-12 ENCOUNTER — Encounter: Payer: Self-pay | Admitting: Family Medicine

## 2013-01-12 ENCOUNTER — Ambulatory Visit (INDEPENDENT_AMBULATORY_CARE_PROVIDER_SITE_OTHER): Payer: 59 | Admitting: Family Medicine

## 2013-01-12 VITALS — BP 112/74 | HR 64 | Temp 99.8°F | Ht 62.5 in | Wt 220.0 lb

## 2013-01-12 DIAGNOSIS — E785 Hyperlipidemia, unspecified: Secondary | ICD-10-CM

## 2013-01-12 DIAGNOSIS — Z79899 Other long term (current) drug therapy: Secondary | ICD-10-CM

## 2013-01-12 DIAGNOSIS — J019 Acute sinusitis, unspecified: Secondary | ICD-10-CM

## 2013-01-12 DIAGNOSIS — R7301 Impaired fasting glucose: Secondary | ICD-10-CM

## 2013-01-12 LAB — POCT GLYCOSYLATED HEMOGLOBIN (HGB A1C): Hemoglobin A1C: 5.9

## 2013-01-12 MED ORDER — ATORVASTATIN CALCIUM 40 MG PO TABS: 40.0000 mg | ORAL_TABLET | Freq: Every day | ORAL | Status: DC

## 2013-01-12 NOTE — Progress Notes (Signed)
Chief Complaint  Patient presents with  . Med check    med check. Needs A1C today due to impaired fasting glucose. Still having chest congestion, feels like when she had pneumonia. Insurance will not pay for Crestor any longer, would like to switch to generic if possible has name of two.   HPI:  Sick since 2/1.  Amoxacillin was called into pharmacy at 2/12 after she had gotten acutely worse (chills, chest congestion)--she wasn't able to get to the pharmacy until 2/15 to start medication due to the snow and road conditions.   Chills have resolved, and chest is hurting less.  Having a hard time getting up phlegm--finally got some up today and it was dark.  Nose isn't running as much, not discolored. Some sinus pain in her cheeks.  She had bloodwork done at [facility] within the last few months--didn't bring copies of results, and she doesn't recall what tests were done.  [employer] said Crestor was approved, but at cost of $125 for 90 days.  Generic statins are much less expensive (around $30/month for atorvastatin).  She doesn't recall having every taken atorvastatin in past.  Past Medical History  Diagnosis Date  . Fibromyalgia   . GERD (gastroesophageal reflux disease)   . Obstructive sleep apnea     On Cpap 2009  . Anxiety   . Depression   . Restless leg syndrome   . Edema   . Migraine   . Hyperlipidemia   . Anemia     previously followed by Dr. Dalene Carrow for anemia and elevated platelets  . Paresthesia     Dr. Antonietta Barcelona at [facility]  . Tremor     Dr. Antonietta Barcelona  . Gastroparesis     followed at [facility]  . Pneumonia     2012  . Plantar fasciitis 02/2011    R foot  . S/P endometrial ablation 08/09/2006    Novasure Ablation  . C. difficile colitis 10/01/12    treated by [facility]  . Dyssynergia     dyssynergenic defecation, contributing to fecal incontinence.   Past Surgical History  Procedure Laterality Date  . Tonsillectomy  1968  . Knee surgery  1999    R knee, Dr. Renae Fickle, torn  cartilage  . Cholecystectomy  9/05  . Endometrial ablation  08/09/2006    Dr. Kingsley Plan Ablation   History   Social History  . Marital Status: Married    Spouse Name: N/A    Number of Children: 2  . Years of Education: N/A   Occupational History  . customer service (on disability) [employer]   Social History Main Topics  . Smoking status: Never Smoker   . Smokeless tobacco: Never Used  . Alcohol Use: No  . Drug Use: No  . Sexually Active: Yes    Birth Control/ Protection: Post-menopausal   Other Topics Concern  . Not on file   Social History Narrative  . No narrative on file   Current Outpatient Prescriptions on File Prior to Visit  Medication Sig Dispense Refill  . albuterol (VENTOLIN HFA) 108 (90 BASE) MCG/ACT inhaler Inhale 2 puffs into the lungs every 4 (four) hours as needed. For shortness of breath      . ALPRAZolam (XANAX) 0.25 MG tablet Take 0.25 mg by mouth 2 (two) times daily as needed. For anxiety      . amoxicillin (AMOXIL) 500 MG capsule Take 2 capsules (1,000 mg total) by mouth 2 (two) times daily.  40 capsule  0  .  ARIPiprazole (ABILIFY) 2 MG tablet Take 2 mg by mouth daily.      . Armodafinil (NUVIGIL) 250 MG tablet Take 250 mg by mouth daily.       . Cetirizine HCl 10 MG CAPS Take 10 mg by mouth at bedtime.       . clonazePAM (KLONOPIN) 0.5 MG tablet Take 1-2 tablets (0.5-1 mg total) by mouth daily as needed. 1-2 tablets at bedtime  60 tablet  2  . dexlansoprazole (DEXILANT) 60 MG capsule Take 60 mg by mouth daily.       Marland Kitchen dronabinol (MARINOL) 5 MG capsule Take 10 mg by mouth 2 (two) times daily before a meal.       . DULoxetine (CYMBALTA) 30 MG capsule Take 60 mg by mouth 2 (two) times daily.       . ferrous sulfate 325 (65 FE) MG EC tablet Take 325 mg by mouth 3 (three) times daily with meals.      . fluticasone (FLONASE) 50 MCG/ACT nasal spray Place 2 sprays into the nose daily.  16 g  5  . hydrOXYzine (ATARAX/VISTARIL) 50 MG tablet TAKE 1/2  TO 2 TABLETS EVERY 6 HOURS AS NEEDED FOR ITCHING  30 tablet  0  . Multiple Vitamin (MULTIVITAMIN PO) Take 2 each by mouth daily.        . nortriptyline (PAMELOR) 10 MG capsule Take 40 mg by mouth at bedtime.       . pregabalin (LYRICA) 50 MG capsule Take 50 mg by mouth 2 (two) times daily.      . Probiotic Product (PHILLIPS COLON HEALTH) CAPS Take 1 capsule by mouth daily.      . prochlorperazine (COMPAZINE) 10 MG tablet Take 1 tablet by mouth Once daily as needed.      . progesterone (PROMETRIUM) 100 MG capsule Take 1 capsule (100 mg total) by mouth at bedtime.  30 capsule  11  . propranolol (INDERAL) 20 MG tablet Take 20 mg by mouth daily.      . sucralfate (CARAFATE) 1 GM/10ML suspension Take 1 g by mouth 4 (four) times daily -  with meals and at bedtime.        . Tamsulosin HCl (FLOMAX) 0.4 MG CAPS Take 1 capsule (0.4 mg total) by mouth daily after supper.  30 capsule  0  . topiramate (TOPAMAX) 50 MG tablet Take 50 mg by mouth at bedtime.       . triamterene-hydrochlorothiazide (DYAZIDE) 37.5-25 MG per capsule Take 1 each (1 capsule total) by mouth daily.  90 capsule  1  . estradiol (VIVELLE-DOT) 0.05 MG/24HR Place 1 patch onto the skin 2 (two) times a week.      . methocarbamol (ROBAXIN) 500 MG tablet Take 500 mg by mouth 2 (two) times daily as needed. For pain      . ondansetron (ZOFRAN) 4 MG tablet Take 4 mg by mouth every 8 (eight) hours as needed.      . promethazine (PHENERGAN) 25 MG suppository Place 25 mg rectally as needed.      . SUMAtriptan (IMITREX) 50 MG tablet TAKE 1 TABLET (50 MG TOTAL) BY MOUTH EVERY 2 (TWO) HOURS AS NEEDED FOR MIGRAINE. MAX 200 MG/DAY  10 tablet  0  . [DISCONTINUED] carbamazepine (TEGRETOL XR) 100 MG 12 hr tablet Take 200 mg by mouth daily.        . [DISCONTINUED] Eszopiclone 3 MG TABS Take 3 mg by mouth at bedtime. Take immediately before bedtime  No current facility-administered medications on file prior to visit.   Allergies  Allergen Reactions  .  Erythromycin Nausea Only  . Tramadol Itching   ROS:  Denies fevers, shortness of breath.  +sinus pain, cough.  Denies ski rash.  +chronic nausea/vomiting, no change in GI symptoms.  +fibromyalgia/chronic pain.  Moods okay.  No edema  PHYSICAL EXAM: BP 112/74  Pulse 64  Temp(Src) 99.8 F (37.7 C) (Oral)  Ht 5' 2.5" (1.588 m)  Wt 220 lb (99.791 kg)  BMI 39.57 kg/m2  LMP 07/27/2006 Pleasant female, not coughing, in no distress Hoarse voice.  Nasal mucosa mildly edematous, slightly red with light yellow crust. +mild sinus tenderness R maxillary sinus.  Lungs clear bilaterally with good air movement, no wheezes, rales or ronchi Heart: regular rate and rhythm Abdomen: soft, obese, nontender Extremities: no edema Psych: normal mood, affect, hygiene and grooming   Lab Results  Component Value Date   HGBA1C 5.9 01/12/2013   ASSESSMENT/PLAN:  Hyperlipidemia - Due for fasting labs.  Need to change to atorvastatin for cost purposes - Plan: atorvastatin (LIPITOR) 40 MG tablet, Lipid panel, Comprehensive metabolic panel, Lipid panel, Hepatic function panel  Impaired fasting glucose - Plan: HgB A1c  Encounter for long-term (current) use of other medications - Plan: Comprehensive metabolic panel  Sinusitis, acute - some improvement on Amoxacillin.  Complete course.  URI/sinus infection--seems to be improving after just a few days of antibiotics.  Complete course of amoxacillin.  Contact us if having recurrent fevers, chills, worsening cough, shortness of breath  Return for fasting c-met and lipids, TSH (unless done by [facility]--last TSH in records was 10/2011  Discussed changing to lipitor 40mg  (after she uses up current supply of Crestor).  She should return 2-3 months after starting lipitor for LFT's and lipid panel.  She can call sooner to change back to Crestor if having significant side effects from the lipitor.   Otherwise, await lab results to see if meeting LDL goals with the 40mg  dose,  or if needs to be increased to 80mg  (assuming that she tolerates the higher dose).  Plasma concentrations and pharmacologic effects of atorvastatin may be decreased by carbamazepine--this interaction is noted.  No danger to pt, so we will start and see what lipids are like on the atorvastatin

## 2013-01-12 NOTE — Patient Instructions (Addendum)
Schedule fasting lab visit for 2-3 months after switching over to atorvastatin so that we can see if it is as effective as the Crestor (and whether or not dose needs to be increased, or switched back to Crestor if you have side effects).  Complete your course of antibiotics.  Don't use both Robitussin and Mucinex (use just the Mucinex)

## 2013-01-14 ENCOUNTER — Other Ambulatory Visit: Payer: 59

## 2013-01-15 ENCOUNTER — Other Ambulatory Visit: Payer: 59

## 2013-01-15 ENCOUNTER — Other Ambulatory Visit: Payer: Self-pay | Admitting: Family Medicine

## 2013-01-15 DIAGNOSIS — E785 Hyperlipidemia, unspecified: Secondary | ICD-10-CM

## 2013-01-15 DIAGNOSIS — Z79899 Other long term (current) drug therapy: Secondary | ICD-10-CM

## 2013-01-15 LAB — LIPID PANEL
Cholesterol: 120 mg/dL (ref 0–200)
HDL: 42 mg/dL (ref >39)
LDL Cholesterol: 59 mg/dL (ref 0–99)
Total CHOL/HDL Ratio: 2.9 Ratio
Triglycerides: 96 mg/dL (ref <150)
VLDL: 19 mg/dL (ref 0–40)

## 2013-01-15 LAB — COMPREHENSIVE METABOLIC PANEL
ALT: 19 U/L (ref 0–35)
AST: 13 U/L (ref 0–37)
Albumin: 3.6 g/dL (ref 3.5–5.2)
Alkaline Phosphatase: 97 U/L (ref 39–117)
BUN: 14 mg/dL (ref 6–23)
CO2: 29 mEq/L (ref 19–32)
Calcium: 9.1 mg/dL (ref 8.4–10.5)
Chloride: 103 mEq/L (ref 96–112)
Creat: 0.91 mg/dL (ref 0.50–1.10)
Glucose, Bld: 96 mg/dL (ref 70–99)
Potassium: 3.6 mEq/L (ref 3.5–5.3)
Sodium: 140 mEq/L (ref 135–145)
Total Bilirubin: 0.3 mg/dL (ref 0.3–1.2)
Total Protein: 6.7 g/dL (ref 6.0–8.3)

## 2013-01-16 LAB — TSH: TSH: 2.285 u[IU]/mL (ref 0.350–4.500)

## 2013-01-16 NOTE — Progress Notes (Signed)
PT STATES THAT SHE BROUGHT IN LABS AND GAVE TO [facility]

## 2013-01-16 NOTE — Progress Notes (Signed)
Quick Note:  LEFT MESSAGE TO CALL BACK  ______ 

## 2013-01-16 NOTE — Progress Notes (Signed)
Quick Note:  PT INFORMED OF LABS ALSO SHE HAS BROUGHT IN LABS FROM [facility] NO TSH WAS DONE BUT [facility] IS GOING TO ADD ON ______

## 2013-01-19 ENCOUNTER — Ambulatory Visit
Admission: RE | Admit: 2013-01-19 | Discharge: 2013-01-19 | Disposition: A | Discharge status: Home / Self Care | Payer: 59 | Source: Ambulatory Visit | Attending: Family Medicine | Admitting: Family Medicine

## 2013-01-19 DIAGNOSIS — Z1231 Encounter for screening mammogram for malignant neoplasm of breast: Secondary | ICD-10-CM

## 2013-01-29 ENCOUNTER — Other Ambulatory Visit: Payer: Self-pay | Admitting: Family Medicine

## 2013-01-29 ENCOUNTER — Other Ambulatory Visit: Payer: Self-pay | Admitting: Gynecology

## 2013-01-29 ENCOUNTER — Encounter: Payer: Self-pay | Admitting: Family Medicine

## 2013-01-29 NOTE — Telephone Encounter (Signed)
Is this okay to refill? 

## 2013-02-02 ENCOUNTER — Other Ambulatory Visit: Payer: Self-pay | Admitting: Gynecology

## 2013-02-04 ENCOUNTER — Other Ambulatory Visit: Payer: Self-pay | Admitting: Family Medicine

## 2013-02-05 NOTE — Telephone Encounter (Signed)
Is this ok?

## 2013-02-05 NOTE — Telephone Encounter (Signed)
Last filled 10/2012.  Refill done

## 2013-02-10 ENCOUNTER — Other Ambulatory Visit: Payer: Self-pay | Admitting: Family Medicine

## 2013-02-11 NOTE — Telephone Encounter (Signed)
Is this okay?

## 2013-02-11 NOTE — Telephone Encounter (Signed)
Ok to refill 

## 2013-02-13 ENCOUNTER — Encounter (HOSPITAL_COMMUNITY): Payer: Self-pay | Admitting: Emergency Medicine

## 2013-02-13 ENCOUNTER — Emergency Department (HOSPITAL_COMMUNITY): Payer: 59

## 2013-02-13 ENCOUNTER — Telehealth: Payer: Self-pay | Admitting: Family Medicine

## 2013-02-13 ENCOUNTER — Emergency Department (HOSPITAL_COMMUNITY)
Admission: EM | Admit: 2013-02-13 | Discharge: 2013-02-13 | Disposition: A | Discharge status: Home / Self Care | Payer: 59 | Attending: Emergency Medicine | Admitting: Emergency Medicine

## 2013-02-13 DIAGNOSIS — Z8739 Personal history of other diseases of the musculoskeletal system and connective tissue: Secondary | ICD-10-CM | POA: Insufficient documentation

## 2013-02-13 DIAGNOSIS — G4733 Obstructive sleep apnea (adult) (pediatric): Secondary | ICD-10-CM | POA: Insufficient documentation

## 2013-02-13 DIAGNOSIS — F329 Major depressive disorder, single episode, unspecified: Secondary | ICD-10-CM | POA: Insufficient documentation

## 2013-02-13 DIAGNOSIS — Z8719 Personal history of other diseases of the digestive system: Secondary | ICD-10-CM | POA: Insufficient documentation

## 2013-02-13 DIAGNOSIS — Z79899 Other long term (current) drug therapy: Secondary | ICD-10-CM | POA: Insufficient documentation

## 2013-02-13 DIAGNOSIS — Z8669 Personal history of other diseases of the nervous system and sense organs: Secondary | ICD-10-CM | POA: Insufficient documentation

## 2013-02-13 DIAGNOSIS — D649 Anemia, unspecified: Secondary | ICD-10-CM | POA: Insufficient documentation

## 2013-02-13 DIAGNOSIS — IMO0002 Reserved for concepts with insufficient information to code with codable children: Secondary | ICD-10-CM | POA: Insufficient documentation

## 2013-02-13 DIAGNOSIS — N23 Unspecified renal colic: Secondary | ICD-10-CM

## 2013-02-13 DIAGNOSIS — G43909 Migraine, unspecified, not intractable, without status migrainosus: Secondary | ICD-10-CM | POA: Insufficient documentation

## 2013-02-13 DIAGNOSIS — Z8701 Personal history of pneumonia (recurrent): Secondary | ICD-10-CM | POA: Insufficient documentation

## 2013-02-13 DIAGNOSIS — R112 Nausea with vomiting, unspecified: Secondary | ICD-10-CM | POA: Insufficient documentation

## 2013-02-13 DIAGNOSIS — Z87442 Personal history of urinary calculi: Secondary | ICD-10-CM | POA: Insufficient documentation

## 2013-02-13 DIAGNOSIS — F3289 Other specified depressive episodes: Secondary | ICD-10-CM | POA: Insufficient documentation

## 2013-02-13 DIAGNOSIS — F411 Generalized anxiety disorder: Secondary | ICD-10-CM | POA: Insufficient documentation

## 2013-02-13 DIAGNOSIS — K219 Gastro-esophageal reflux disease without esophagitis: Secondary | ICD-10-CM | POA: Insufficient documentation

## 2013-02-13 DIAGNOSIS — E785 Hyperlipidemia, unspecified: Secondary | ICD-10-CM | POA: Insufficient documentation

## 2013-02-13 DIAGNOSIS — Z8619 Personal history of other infectious and parasitic diseases: Secondary | ICD-10-CM | POA: Insufficient documentation

## 2013-02-13 HISTORY — DX: Calculus of kidney: N20.0

## 2013-02-13 LAB — POCT I-STAT, CHEM 8
BUN: 20 mg/dL (ref 6–23)
Calcium, Ion: 1.19 mmol/L (ref 1.12–1.23)
Chloride: 105 mEq/L (ref 96–112)
Creatinine, Ser: 1 mg/dL (ref 0.50–1.10)
Glucose, Bld: 99 mg/dL (ref 70–99)
HCT: 44 % (ref 36.0–46.0)
Hemoglobin: 15 g/dL (ref 12.0–15.0)
Potassium: 3.5 mEq/L (ref 3.5–5.1)
Sodium: 141 mEq/L (ref 135–145)
TCO2: 29 mmol/L (ref 0–100)

## 2013-02-13 LAB — URINALYSIS, ROUTINE W REFLEX MICROSCOPIC
Glucose, UA: NEGATIVE mg/dL
Ketones, ur: NEGATIVE mg/dL
Nitrite: NEGATIVE
Protein, ur: NEGATIVE mg/dL
Specific Gravity, Urine: 1.036 — ABNORMAL HIGH (ref 1.005–1.030)
Urobilinogen, UA: 0.2 mg/dL (ref 0.0–1.0)
pH: 5.5 (ref 5.0–8.0)

## 2013-02-13 LAB — URINE MICROSCOPIC-ADD ON

## 2013-02-13 MED ORDER — DIPHENHYDRAMINE HCL 50 MG/ML IJ SOLN
12.5000 mg | Freq: Once | INTRAMUSCULAR | Status: AC
  Administered 2013-02-13: 12.5 mg via INTRAVENOUS
  Filled 2013-02-13: qty 1

## 2013-02-13 MED ORDER — IBUPROFEN 600 MG PO TABS
600.0000 mg | ORAL_TABLET | Freq: Two times a day (BID) | ORAL | Status: DC
Start: 2013-02-13 — End: 2015-04-06

## 2013-02-13 MED ORDER — ONDANSETRON HCL 4 MG/2ML IJ SOLN
4.0000 mg | Freq: Once | INTRAMUSCULAR | Status: AC
  Administered 2013-02-13: 4 mg via INTRAVENOUS
  Filled 2013-02-13: qty 2

## 2013-02-13 MED ORDER — KETOROLAC TROMETHAMINE 15 MG/ML IJ SOLN
15.0000 mg | Freq: Once | INTRAMUSCULAR | Status: AC
  Administered 2013-02-13: 15 mg via INTRAVENOUS
  Filled 2013-02-13: qty 1

## 2013-02-13 MED ORDER — HYDROMORPHONE HCL PF 1 MG/ML IJ SOLN
1.0000 mg | Freq: Once | INTRAMUSCULAR | Status: AC
  Administered 2013-02-13: 1 mg via INTRAVENOUS
  Filled 2013-02-13: qty 1

## 2013-02-13 MED ORDER — HYDROCODONE-ACETAMINOPHEN 5-325 MG PO TABS: ORAL_TABLET | ORAL | Status: DC

## 2013-02-13 NOTE — Discharge Instructions (Signed)
 Ureteral Colic (Kidney Stones) Ureteral colic is the result of a condition when kidney stones form inside the kidney. Once kidney stones are formed they may move into the tube that connects the kidney with the bladder (ureter). If this occurs, this condition may cause pain (colic) in the ureter.  CAUSES  Pain is caused by stone movement in the ureter and the obstruction caused by the stone. SYMPTOMS  The pain comes and goes as the ureter contracts around the stone. The pain is usually intense, sharp, and stabbing in character. The location of the pain may move as the stone moves through the ureter. When the stone is near the kidney the pain is usually located in the back and radiates to the belly (abdomen). When the stone is ready to pass into the bladder the pain is often located in the lower abdomen on the side the stone is located. At this location, the symptoms may mimic those of a urinary tract infection with urinary frequency. Once the stone is located here it often passes into the bladder and the pain disappears completely. TREATMENT   Your caregiver will provide you with medicine for pain relief.  You may require specialized follow-up X-rays.  The absence of pain does not always mean that the stone has passed. It may have just stopped moving. If the urine remains completely obstructed, it can cause loss of kidney function or even complete destruction of the involved kidney. It is your responsibility and in your interest that X-rays and follow-ups as suggested by your caregiver are completed. Relief of pain without passage of the stone can be associated with severe damage to the kidney, including loss of kidney function on that side.  If your stone does not pass on its own, additional measures may be taken by your caregiver to ensure its removal. HOME CARE INSTRUCTIONS   Increase your fluid intake. Water  is the preferred fluid since juices containing vitamin C may acidify the urine making it  less likely for certain stones (uric acid stones) to pass.  Strain all urine. A strainer will be provided. Keep all particulate matter or stones for your caregiver to inspect.  Take your pain medicine as directed.  Make a follow-up appointment with your caregiver as directed.  Remember that the goal is passage of your stone. The absence of pain does not mean the stone is gone. Follow your caregiver's instructions.  Only take over-the-counter or prescription medicines for pain, discomfort, or fever as directed by your caregiver. SEEK MEDICAL CARE IF:   Pain cannot be controlled with the prescribed medicine.  You have a fever.  Pain continues for longer than your caregiver advises it should.  There is a change in the pain, and you develop chest discomfort or constant abdominal pain.  You feel faint or pass out. MAKE SURE YOU:   Understand these instructions.  Will watch your condition.  Will get help right away if you are not doing well or get worse. Document Released: 08/22/2005 Document Revised: 02/04/2012 Document Reviewed: 05/09/2011 [facility] Patient Information 2013 [place], [place].    Narcotic and benzodiazepine use may cause drowsiness, slowed breathing or dependence.  Please use with caution and do not drive, operate machinery or watch young children alone while taking them.  Taking combinations of these medications or drinking alcohol  will potentiate these effects.

## 2013-02-13 NOTE — ED Notes (Signed)
Hx of gastroparesis. Pain in right flank started today Pian radiates to back.

## 2013-02-13 NOTE — Telephone Encounter (Signed)
PT CALLED AND STATED SHE BELIEVED SHE HAD ANOTHER KIDNEY STONE. SHE STATED SHE HAD ONE ABOUT A YEAR AGO. PT WAS ADVISED TO CALL [facility] FOR [facility] BECAUSE WE WE UNABLE TO SEE HER DO TO [facility]'S SCHEDULE AND THE FACT THAT SHE HAS A HISTORY WITH THEM.  PT WAS ALSO INFORMED TO GO TO ER IF ANY ADDITIONAL CONCERNS OR IF UROLOGY WAS UNABLE TO SEE HER.

## 2013-02-13 NOTE — ED Provider Notes (Signed)
History     CSN: [id number]  Arrival date & time 02/13/13  1631   First MD Initiated Contact with Patient 02/13/13 1636      No chief complaint on file.   (Consider location/radiation/quality/duration/timing/severity/associated sxs/prior treatment) HPI Comments: Patient reports a sudden onset of right flank pain that radiates toward the right lower abdomen and bladder area that began approximately 2 hours ago. She has similar episode a year ago and was diagnosed with a kidney stone. She later followed up with urologist, Dr. Annabell Howells who performed another radiograph in his office showing that she passed the stone later. She has a significant history of gastroparesis and did have an episode of vomiting earlier this morning. She denies fever or chills, urinary frequency or dysuria. She reports a significant history of gastroparesis and thus does not think too unusually about her episode of vomiting this morning. She also denies any hematuria. She denies any gynecologic symptoms. She is only taking her usual daily medications. She has not had any further nausea or vomiting since the pain began. She also has a history of cholecystectomy.  The history is provided by the patient, the spouse and medical records.    Past Medical History  Diagnosis Date  . Fibromyalgia   . GERD (gastroesophageal reflux disease)   . Obstructive sleep apnea     On Cpap 2009  . Anxiety   . Depression   . Restless leg syndrome   . Edema   . Migraine   . Hyperlipidemia   . Anemia     previously followed by Dr. Dalene Carrow for anemia and elevated platelets  . Paresthesia     Dr. Antonietta Barcelona at [facility]  . Tremor     Dr. Antonietta Barcelona  . Gastroparesis     followed at [facility]  . Pneumonia     2012  . Plantar fasciitis 02/2011    R foot  . S/P endometrial ablation 08/09/2006    Novasure Ablation  . C. difficile colitis 10/01/12    treated by [facility]  . Dyssynergia     dyssynergenic defecation, contributing to fecal  incontinence.  . Kidney stone     Past Surgical History  Procedure Laterality Date  . Tonsillectomy  1968  . Knee surgery  1999    R knee, Dr. Renae Fickle, torn cartilage  . Cholecystectomy  9/05  . Endometrial ablation  08/09/2006    Dr. Kingsley Plan Ablation    Family History  Problem Relation Age of Onset  . Heart disease Mother     pacemaker  . Allergies Mother   . Hypertension Mother   . Heart disease Father   . Hypertension Father   . Diabetes Father   . Heart disease Paternal Grandmother   . Heart disease Paternal Grandfather   . Asthma Sister   . Irritable bowel syndrome Sister   . Allergies Sister   . Allergies Sister     History  Substance Use Topics  . Smoking status: Never Smoker   . Smokeless tobacco: Never Used  . Alcohol Use: No    OB History   Grav Para Term Preterm Abortions TAB SAB Ect Mult Living   1 1 1       1       Review of Systems  Constitutional: Negative for fever and chills.  Gastrointestinal: Positive for nausea and vomiting. Negative for abdominal pain.  Genitourinary: Positive for flank pain. Negative for dysuria, frequency, hematuria, vaginal bleeding and vaginal discharge.  Skin: Negative for  rash.  All other systems reviewed and are negative.    Allergies  Erythromycin; Tramadol; and Dilaudid  Home Medications   Current Outpatient Rx  Name  Route  Sig  Dispense  Refill  . ALPRAZolam (XANAX) 0.25 MG tablet   Oral   Take 0.25 mg by mouth 2 (two) times daily as needed. For anxiety         . ARIPiprazole (ABILIFY) 2 MG tablet   Oral   Take 4 mg by mouth every morning.          . Armodafinil (NUVIGIL) 250 MG tablet   Oral   Take 250 mg by mouth every morning.          . cetirizine (ZYRTEC) 10 MG tablet   Oral   Take 10 mg by mouth at bedtime.         . clonazePAM (KLONOPIN) 0.5 MG tablet   Oral   Take 0.5-1 mg by mouth at bedtime.         Marland Kitchen dexlansoprazole (DEXILANT) 60 MG capsule   Oral   Take 60 mg  by mouth daily.          Marland Kitchen dronabinol (MARINOL) 5 MG capsule   Oral   Take 10 mg by mouth 2 (two) times daily before a meal.          . DULoxetine (CYMBALTA) 60 MG capsule   Oral   Take 60 mg by mouth 2 (two) times daily.         . ferrous sulfate 325 (65 FE) MG EC tablet   Oral   Take 325 mg by mouth 2 (two) times daily.          . fluticasone (FLONASE) 50 MCG/ACT nasal spray   Nasal   Place 1 spray into the nose daily.         . hydrOXYzine (ATARAX/VISTARIL) 25 MG tablet   Oral   Take 12.5-50 mg by mouth every 6 (six) hours as needed (itching).         . methocarbamol (ROBAXIN) 500 MG tablet   Oral   Take 500 mg by mouth 2 (two) times daily as needed (muscle spasms). For pain         . nortriptyline (PAMELOR) 10 MG capsule   Oral   Take 40 mg by mouth at bedtime.          . ondansetron (ZOFRAN) 4 MG tablet   Oral   Take 4 mg by mouth every 8 (eight) hours as needed (nausea).          . pregabalin (LYRICA) 50 MG capsule   Oral   Take 50 mg by mouth 2 (two) times daily.         . Probiotic Product (PHILLIPS COLON HEALTH) CAPS   Oral   Take 1 capsule by mouth daily.         . progesterone (PROMETRIUM) 100 MG capsule   Oral   Take 100 mg by mouth at bedtime.         . promethazine (PHENERGAN) 25 MG suppository   Rectal   Place 25 mg rectally as needed (nausea).          . rosuvastatin (CRESTOR) 20 MG tablet   Oral   Take 20 mg by mouth daily.         . sucralfate (CARAFATE) 1 GM/10ML suspension   Oral   Take 1 g by mouth 4 (four) times daily -  with  meals and at bedtime.          . topiramate (TOPAMAX) 50 MG tablet   Oral   Take 50 mg by mouth at bedtime.          . triamterene-hydrochlorothiazide (DYAZIDE) 37.5-25 MG per capsule   Oral   Take 1 each (1 capsule total) by mouth daily.   90 capsule   1   . HYDROcodone-acetaminophen (NORCO/VICODIN) 5-325 MG per tablet      1-2 tablets po q 6 hours prn moderate to severe  pain   20 tablet   0   . ibuprofen (ADVIL,MOTRIN) 600 MG tablet   Oral   Take 1 tablet (600 mg total) by mouth every 12 (twelve) hours.   10 tablet   0     BP 112/80  Pulse 90  Temp(Src) 98 F (36.7 C) (Oral)  Resp 20  SpO2 98%  LMP 07/27/2006  Physical Exam  Nursing note and vitals reviewed. Constitutional: She is oriented to person, place, and time. She appears well-developed and well-nourished.  HENT:  Head: Normocephalic and atraumatic.  Eyes: EOM are normal. No scleral icterus.  Neck: Normal range of motion. Neck supple.  Cardiovascular: Normal rate and regular rhythm.   Pulmonary/Chest: Effort normal. No respiratory distress. She has no wheezes.  Abdominal: Soft. Normal appearance. She exhibits no distension. There is no tenderness. There is CVA tenderness. There is no rebound, no guarding, no tenderness at McBurney's point and negative Murphy's sign.  Musculoskeletal: She exhibits no edema.  Neurological: She is alert and oriented to person, place, and time.  Skin: Skin is warm and dry. No rash noted.    ED Course  Procedures (including critical care time)  Labs Reviewed  URINALYSIS, ROUTINE W REFLEX MICROSCOPIC - Abnormal; Notable for the following:    APPearance CLOUDY (*)    Specific Gravity, Urine 1.036 (*)    Hgb urine dipstick LARGE (*)    Bilirubin Urine SMALL (*)    Leukocytes, UA MODERATE (*)    All other components within normal limits  URINE MICROSCOPIC-ADD ON - Abnormal; Notable for the following:    Casts GRANULAR CAST (*)    Crystals CA OXALATE CRYSTALS (*)    All other components within normal limits  POCT I-STAT, CHEM 8   Dg Abd 1 View  02/13/2013  *RADIOLOGY REPORT*  Clinical Data: Right-sided flank pain.  Suspected kidney stone.  ABDOMEN - 1 VIEW  Comparison: Abdominal radiograph 03/10/2012.  Findings: Gas and stool are seen scattered throughout the colon extending to the level of the distal rectum.  No pathologic distension of small bowel  is noted.  No gross evidence of pneumoperitoneum.  No radiopaque calculi are identified projecting over either kidney, or the expected course of either ureter.  A few pelvic phleboliths appears similar to the prior study.  Surgical clips project over the right upper quadrant of the abdomen, compatible with prior cholecystectomy.  IMPRESSION: 1.  No definite urinary tract calculi identified by plain film examination.  Noncontrast CT is far more sensitive and specific for detection of nephrolithiasis and could be utilized if clinically indicated. 2.  Nonobstructive bowel gas pattern. 3.  Status post cholecystectomy.   Original Report Authenticated By: Trudie Reed, M.D.      1. Ureteral colic     ra sat is 95% and I interpret to be normal  8:06 PM Pt is itching, but no rash, no resp distress.  Pain is much imrpvoed,. No further N/V.  Discussed findings on KUB . UA has hematuria, suggestive of ureteral colic in my opinion.  Will d/c home on Rx for pain and pt had nausea meds at home, will refer her back to Dr. Annabell Howells for follow up.  MDM  Pt with ureteral colic.  I suspect ureteral stone based on history mostly.  Will check Istat for Cr and also UA.  Pt has had numerous CT's in the past, will get KUB for now.  Last CT was from April 2013 showing left ureteral stone with obstruction.  No AAA seen then.          Gavin Pound. Khyre Germond, MD 02/13/13 2010

## 2013-02-19 ENCOUNTER — Other Ambulatory Visit: Payer: Self-pay

## 2013-02-19 ENCOUNTER — Encounter (HOSPITAL_COMMUNITY): Payer: Self-pay | Admitting: *Deleted

## 2013-02-19 ENCOUNTER — Emergency Department (HOSPITAL_COMMUNITY)
Admission: EM | Admit: 2013-02-19 | Discharge: 2013-02-20 | Disposition: A | Discharge status: Home / Self Care | Payer: 59 | Attending: Emergency Medicine | Admitting: Emergency Medicine

## 2013-02-19 ENCOUNTER — Encounter: Payer: 59 | Admitting: Gynecology

## 2013-02-19 ENCOUNTER — Encounter (HOSPITAL_COMMUNITY): Payer: Self-pay | Admitting: Emergency Medicine

## 2013-02-19 ENCOUNTER — Emergency Department (HOSPITAL_COMMUNITY): Payer: 59

## 2013-02-19 ENCOUNTER — Emergency Department (HOSPITAL_COMMUNITY)
Admission: EM | Admit: 2013-02-19 | Discharge: 2013-02-19 | Disposition: A | Discharge status: Nursing Home (Includes ICF) | Payer: 59 | Source: Home / Self Care | Attending: Family Medicine | Admitting: Family Medicine

## 2013-02-19 DIAGNOSIS — Z79899 Other long term (current) drug therapy: Secondary | ICD-10-CM | POA: Insufficient documentation

## 2013-02-19 DIAGNOSIS — Z8669 Personal history of other diseases of the nervous system and sense organs: Secondary | ICD-10-CM | POA: Insufficient documentation

## 2013-02-19 DIAGNOSIS — Z862 Personal history of diseases of the blood and blood-forming organs and certain disorders involving the immune mechanism: Secondary | ICD-10-CM | POA: Insufficient documentation

## 2013-02-19 DIAGNOSIS — Z8659 Personal history of other mental and behavioral disorders: Secondary | ICD-10-CM | POA: Insufficient documentation

## 2013-02-19 DIAGNOSIS — IMO0001 Reserved for inherently not codable concepts without codable children: Secondary | ICD-10-CM | POA: Insufficient documentation

## 2013-02-19 DIAGNOSIS — G4733 Obstructive sleep apnea (adult) (pediatric): Secondary | ICD-10-CM | POA: Insufficient documentation

## 2013-02-19 DIAGNOSIS — Z8679 Personal history of other diseases of the circulatory system: Secondary | ICD-10-CM | POA: Insufficient documentation

## 2013-02-19 DIAGNOSIS — R112 Nausea with vomiting, unspecified: Secondary | ICD-10-CM | POA: Insufficient documentation

## 2013-02-19 DIAGNOSIS — Z8701 Personal history of pneumonia (recurrent): Secondary | ICD-10-CM | POA: Insufficient documentation

## 2013-02-19 DIAGNOSIS — K219 Gastro-esophageal reflux disease without esophagitis: Secondary | ICD-10-CM | POA: Insufficient documentation

## 2013-02-19 DIAGNOSIS — R42 Dizziness and giddiness: Secondary | ICD-10-CM | POA: Insufficient documentation

## 2013-02-19 DIAGNOSIS — N179 Acute kidney failure, unspecified: Secondary | ICD-10-CM | POA: Insufficient documentation

## 2013-02-19 DIAGNOSIS — N289 Disorder of kidney and ureter, unspecified: Secondary | ICD-10-CM

## 2013-02-19 DIAGNOSIS — Z8719 Personal history of other diseases of the digestive system: Secondary | ICD-10-CM | POA: Insufficient documentation

## 2013-02-19 DIAGNOSIS — E860 Dehydration: Secondary | ICD-10-CM | POA: Insufficient documentation

## 2013-02-19 DIAGNOSIS — R1110 Vomiting, unspecified: Secondary | ICD-10-CM

## 2013-02-19 DIAGNOSIS — R197 Diarrhea, unspecified: Secondary | ICD-10-CM | POA: Insufficient documentation

## 2013-02-19 DIAGNOSIS — Z8739 Personal history of other diseases of the musculoskeletal system and connective tissue: Secondary | ICD-10-CM | POA: Insufficient documentation

## 2013-02-19 DIAGNOSIS — R000 Tachycardia, unspecified: Secondary | ICD-10-CM | POA: Insufficient documentation

## 2013-02-19 DIAGNOSIS — Z8742 Personal history of other diseases of the female genital tract: Secondary | ICD-10-CM | POA: Insufficient documentation

## 2013-02-19 DIAGNOSIS — E785 Hyperlipidemia, unspecified: Secondary | ICD-10-CM | POA: Insufficient documentation

## 2013-02-19 DIAGNOSIS — I499 Cardiac arrhythmia, unspecified: Secondary | ICD-10-CM

## 2013-02-19 LAB — CBC WITH DIFFERENTIAL/PLATELET
Basophils Absolute: 0 10*3/uL (ref 0.0–0.1)
Basophils Relative: 0 % (ref 0–1)
Eosinophils Absolute: 0.2 10*3/uL (ref 0.0–0.7)
Eosinophils Relative: 2 % (ref 0–5)
HCT: 53.5 % — ABNORMAL HIGH (ref 36.0–46.0)
Hemoglobin: 18.4 g/dL — ABNORMAL HIGH (ref 12.0–15.0)
Lymphocytes Relative: 31 % (ref 12–46)
Lymphs Abs: 2.9 10*3/uL (ref 0.7–4.0)
MCH: 29.6 pg (ref 26.0–34.0)
MCHC: 34.4 g/dL (ref 30.0–36.0)
MCV: 86.2 fL (ref 78.0–100.0)
Monocytes Absolute: 1.1 10*3/uL — ABNORMAL HIGH (ref 0.1–1.0)
Monocytes Relative: 11 % (ref 3–12)
Neutro Abs: 5.2 10*3/uL (ref 1.7–7.7)
Neutrophils Relative %: 56 % (ref 43–77)
Platelets: 447 10*3/uL — ABNORMAL HIGH (ref 150–400)
RBC: 6.21 MIL/uL — ABNORMAL HIGH (ref 3.87–5.11)
RDW: 15.7 % — ABNORMAL HIGH (ref 11.5–15.5)
WBC: 9.4 10*3/uL (ref 4.0–10.5)

## 2013-02-19 LAB — BASIC METABOLIC PANEL
BUN: 22 mg/dL (ref 6–23)
CO2: 26 mEq/L (ref 19–32)
Calcium: 9.7 mg/dL (ref 8.4–10.5)
Chloride: 97 mEq/L (ref 96–112)
Creatinine, Ser: 1.23 mg/dL — ABNORMAL HIGH (ref 0.50–1.10)
GFR calc Af Amer: 56 mL/min — ABNORMAL LOW (ref >90)
GFR calc non Af Amer: 48 mL/min — ABNORMAL LOW (ref >90)
Glucose, Bld: 98 mg/dL (ref 70–99)
Potassium: 3.1 mEq/L — ABNORMAL LOW (ref 3.5–5.1)
Sodium: 140 mEq/L (ref 135–145)

## 2013-02-19 LAB — OCCULT BLOOD, POC DEVICE: Fecal Occult Bld: NEGATIVE

## 2013-02-19 LAB — TROPONIN I: Troponin I: <0.3 ng/mL (ref <0.30)

## 2013-02-19 LAB — LACTIC ACID, PLASMA: Lactic Acid, Venous: 2 mmol/L (ref 0.5–2.2)

## 2013-02-19 MED ORDER — SODIUM CHLORIDE 0.9 % IV BOLUS (SEPSIS)
1000.0000 mL | Freq: Once | INTRAVENOUS | Status: AC
  Administered 2013-02-19: 1000 mL via INTRAVENOUS

## 2013-02-19 MED ORDER — ASPIRIN 81 MG PO CHEW
CHEWABLE_TABLET | ORAL | Status: AC
  Filled 2013-02-19: qty 1

## 2013-02-19 NOTE — ED Notes (Signed)
MD at bedside. 

## 2013-02-19 NOTE — ED Notes (Signed)
Pt states just getting over the stomache bug.

## 2013-02-19 NOTE — ED Provider Notes (Signed)
History     CSN: [id number]  Arrival date & time 02/19/13  2005   First MD Initiated Contact with Patient 02/19/13 2024      Chief Complaint  Patient presents with  . Chest Pain    urgent care transfer possible STEMI    (Consider location/radiation/quality/duration/timing/severity/associated sxs/prior treatment) HPI Comments: 57 yo female with dizziness upon standing which started this morning, and has gotten worse throughout the day.  She has fallen once because of this, but did not syncopize.  She had severe vomiting and diarrhea yesterday which she thought was a "stomach bug".  Positive sick contact with similar symptoms.  She was sent from Urgent care out of concern for an acute MI.    Patient is a 57 y.o. female presenting with general illness. The history is provided by the patient.  Illness  The current episode started yesterday. The problem occurs continuously. Progression since onset: diarrhea, vomiting resolved.  Now replaced with lightheadedness. The problem is severe. Nothing relieves the symptoms. Exacerbated by: standing. Pertinent negatives include no fever, no abdominal pain, no diarrhea, no nausea, no vomiting, no congestion and no cough.    Past Medical History  Diagnosis Date  . Fibromyalgia   . GERD (gastroesophageal reflux disease)   . Obstructive sleep apnea     On Cpap 2009  . Anxiety   . Depression   . Restless leg syndrome   . Edema   . Migraine   . Hyperlipidemia   . Anemia     previously followed by Dr. Dalene Carrow for anemia and elevated platelets  . Paresthesia     Dr. Antonietta Barcelona at [facility]  . Tremor     Dr. Antonietta Barcelona  . Gastroparesis     followed at [facility]  . Pneumonia     2012  . Plantar fasciitis 02/2011    R foot  . S/P endometrial ablation 08/09/2006    Novasure Ablation  . C. difficile colitis 10/01/12    treated by [facility]  . Dyssynergia     dyssynergenic defecation, contributing to fecal incontinence.  . Kidney stone     Past  Surgical History  Procedure Laterality Date  . Tonsillectomy  1968  . Knee surgery  1999    R knee, Dr. Renae Fickle, torn cartilage  . Cholecystectomy  9/05  . Endometrial ablation  08/09/2006    Dr. Kingsley Plan Ablation    Family History  Problem Relation Age of Onset  . Heart disease Mother     pacemaker  . Allergies Mother   . Hypertension Mother   . Heart disease Father   . Hypertension Father   . Diabetes Father   . Heart disease Paternal Grandmother   . Heart disease Paternal Grandfather   . Asthma Sister   . Irritable bowel syndrome Sister   . Allergies Sister   . Allergies Sister     History  Substance Use Topics  . Smoking status: Never Smoker   . Smokeless tobacco: Never Used  . Alcohol Use: No    OB History   Grav Para Term Preterm Abortions TAB SAB Ect Mult Living   1 1 1       1       Review of Systems  Constitutional: Negative for fever.  HENT: Negative for congestion.   Respiratory: Negative for cough and shortness of breath.   Cardiovascular: Positive for chest pain.  Gastrointestinal: Negative for nausea, vomiting, abdominal pain and diarrhea.  Genitourinary: Negative for difficulty urinating.  All other systems reviewed and are negative.    Allergies  Erythromycin; Tramadol; and Dilaudid  Home Medications   Current Outpatient Rx  Name  Route  Sig  Dispense  Refill  . ALPRAZolam (XANAX) 0.25 MG tablet   Oral   Take 0.25 mg by mouth 2 (two) times daily as needed. For anxiety         . ARIPiprazole (ABILIFY) 2 MG tablet   Oral   Take 4 mg by mouth every morning.          . Armodafinil (NUVIGIL) 250 MG tablet   Oral   Take 250 mg by mouth every morning.          . cetirizine (ZYRTEC) 10 MG tablet   Oral   Take 10 mg by mouth at bedtime.         . clonazePAM (KLONOPIN) 0.5 MG tablet   Oral   Take 0.5-1 mg by mouth at bedtime.         Marland Kitchen dexlansoprazole (DEXILANT) 60 MG capsule   Oral   Take 60 mg by mouth daily.           Marland Kitchen dronabinol (MARINOL) 5 MG capsule   Oral   Take 10 mg by mouth 2 (two) times daily before a meal.          . DULoxetine (CYMBALTA) 60 MG capsule   Oral   Take 60 mg by mouth 2 (two) times daily.         . ferrous sulfate 325 (65 FE) MG EC tablet   Oral   Take 325 mg by mouth 2 (two) times daily.          . fluticasone (FLONASE) 50 MCG/ACT nasal spray   Nasal   Place 1 spray into the nose daily.         Marland Kitchen HYDROcodone-acetaminophen (NORCO/VICODIN) 5-325 MG per tablet      1-2 tablets po q 6 hours prn moderate to severe pain   20 tablet   0   . hydrOXYzine (ATARAX/VISTARIL) 25 MG tablet   Oral   Take 12.5-50 mg by mouth every 6 (six) hours as needed (itching).         Marland Kitchen ibuprofen (ADVIL,MOTRIN) 600 MG tablet   Oral   Take 1 tablet (600 mg total) by mouth every 12 (twelve) hours.   10 tablet   0   . methocarbamol (ROBAXIN) 500 MG tablet   Oral   Take 500 mg by mouth 2 (two) times daily as needed (muscle spasms). For pain         . nortriptyline (PAMELOR) 10 MG capsule   Oral   Take 40 mg by mouth at bedtime.          . ondansetron (ZOFRAN) 4 MG tablet   Oral   Take 4 mg by mouth every 8 (eight) hours as needed (nausea).          . pregabalin (LYRICA) 50 MG capsule   Oral   Take 50 mg by mouth 2 (two) times daily.         . Probiotic Product (PHILLIPS COLON HEALTH) CAPS   Oral   Take 1 capsule by mouth daily.         . progesterone (PROMETRIUM) 100 MG capsule   Oral   Take 100 mg by mouth at bedtime.         . promethazine (PHENERGAN) 25 MG suppository   Rectal   Place 25  mg rectally as needed (nausea).          . rosuvastatin (CRESTOR) 20 MG tablet   Oral   Take 20 mg by mouth daily.         . sucralfate (CARAFATE) 1 GM/10ML suspension   Oral   Take 1 g by mouth 4 (four) times daily -  with meals and at bedtime.          . topiramate (TOPAMAX) 50 MG tablet   Oral   Take 50 mg by mouth at bedtime.          .  triamterene-hydrochlorothiazide (DYAZIDE) 37.5-25 MG per capsule   Oral   Take 1 each (1 capsule total) by mouth daily.   90 capsule   1     BP 112/87  Pulse 106  Resp 16  SpO2 98%  LMP 07/27/2006  Physical Exam  Nursing note and vitals reviewed. Constitutional: She is oriented to person, place, and time. She appears well-developed and well-nourished. No distress.  HENT:  Head: Normocephalic and atraumatic.  Mouth/Throat: Oropharynx is clear and moist.  Eyes: Conjunctivae are normal. Pupils are equal, round, and reactive to light. No scleral icterus.  Neck: Neck supple.  Cardiovascular: Regular rhythm, normal heart sounds and intact distal pulses.  Tachycardia present.   No murmur heard. Pulmonary/Chest: Effort normal and breath sounds normal. No stridor. No respiratory distress. She has no rales. She exhibits tenderness (left anterior).  Abdominal: Soft. Bowel sounds are normal. She exhibits no distension. There is no tenderness. There is no rebound and no guarding.  Musculoskeletal: Normal range of motion. She exhibits no edema and no tenderness.  Neurological: She is alert and oriented to person, place, and time. She has normal strength. No cranial nerve deficit or sensory deficit. Coordination normal. GCS eye subscore is 4. GCS verbal subscore is 5. GCS motor subscore is 6.  No truncal ataxia  Skin: Skin is warm and dry. No rash noted.  Psychiatric: She has a normal mood and affect. Her behavior is normal.    ED Course  [place] bedside Date/Time: 02/19/2013 9:19 PM Performed by: Rennis Petty Authorized by: Gwyneth Sprout Patient tolerance: Patient tolerated the procedure well with no immediate complications. Comments: Ultrasound guidance used to place 18g peripheral IV in left AC.     (including critical care time)  Labs Reviewed  CBC WITH DIFFERENTIAL - Abnormal; Notable for the following:    RBC 6.21 (*)    Hemoglobin 18.4 (*)    HCT 53.5 (*)    RDW 15.7 (*)     Platelets 447 (*)    Monocytes Absolute 1.1 (*)    All other components within normal limits  BASIC METABOLIC PANEL - Abnormal; Notable for the following:    Potassium 3.1 (*)    Creatinine, Ser 1.23 (*)    GFR calc non Af Amer 48 (*)    GFR calc Af Amer 56 (*)    All other components within normal limits  LACTIC ACID, PLASMA  TROPONIN I  URINALYSIS, ROUTINE W REFLEX MICROSCOPIC  OCCULT BLOOD, POC DEVICE   Dg Chest Portable 1 View  02/19/2013  *RADIOLOGY REPORT*  Clinical Data: Chest pain.  PORTABLE CHEST - 1 VIEW  Comparison: 03/13/2012.  Findings: The cardiac silhouette, mediastinal and hilar contours are normal and stable.  Low lung volumes with streaky bibasilar atelectasis.  No effusions or infiltrates.  The bony thorax is intact.  IMPRESSION: Low lung volumes with vascular crowding and streaky bibasilar  atelectasis.   Original Report Authenticated By: Rudie Meyer, M.D.   All radiology studies independently viewed by me.      Date: 02/19/2013  Rate: 119  Rhythm: sinus tachycardia  QRS Axis: left  Intervals: QT prolonged  ST/T Wave abnormalities: normal  Conduction Disutrbances:nonspecific intraventricular conduction delay  Narrative Interpretation: baseline artifact  Old EKG Reviewed: unchanged   1. Dehydration   2. Renal insufficiency, mild   3. Vomiting and diarrhea       MDM     57 yo female sent from urgent care secondary to chest pain and EKG changes which were felt to be concerning for MI.  EKG on arrival not consistent with STEMI.  She reports chest pain only after she fell and hit her chest.  Do not suspect ACS.  No syncope.  She is tachycardic and orthostatic by symptoms.  Likely from acute volume loss from viral gastroenteritis.  Plan to check labs, CXR, and give IV fluids.   No other signs of injury from fall, do not think she needs head or c spine imaging.   11:55 PM labwork shows evidence of hemoconcentration and mild renal insufficiency likely from  dehydration.  2L of NS given.  HR normalized and BPs remained stable.  She was able to stand and walk to bathroom without difficulty or dizziness.  She will follow up with her PCP for a recheck of her renal function.  Return precautions given.       Rennis Petty, MD 02/20/13 [phone]

## 2013-02-19 NOTE — ED Notes (Addendum)
Pt states that the pain when MD presses on her chest does mimic the pain/soreness in her chest back. Pt states every time she stands up she has dizziness. Pt states that it started after the fall, but increased throughout the day. Pt husband states she has fallen several times throughout the past couple of weeks with out LOC each fall.

## 2013-02-19 NOTE — ED Notes (Signed)
Pt states that this a.m she felt dizzy this a.m and upon standing she loss balance and fell face first into the floor and is now having left side face pain/headache, chest/back pain. Pt states the pain is gradually getting worse. Also having some nausea.   Pt has not tried any meds for pain.

## 2013-02-19 NOTE — ED Provider Notes (Addendum)
History     CSN: [id number]  Arrival date & time 02/19/13  1836   First MD Initiated Contact with Patient 02/19/13 1845      Chief Complaint  Patient presents with  . Fall    this a.m fell face first into floor hitting left side of face and chest/back    (Consider location/radiation/quality/duration/timing/severity/associated sxs/prior treatment) Patient is a 57 y.o. female presenting with fall. The history is provided by the patient and the spouse.  Fall The accident occurred 6 to 12 hours ago. The fall occurred while walking (in house this am felt weak and fell face first against a door , has been weak and in bed all day, husband brought for eval.). She landed on carpet. There was no blood loss. She was ambulatory at the scene. Pertinent negatives include no loss of consciousness.    Past Medical History  Diagnosis Date  . Fibromyalgia   . GERD (gastroesophageal reflux disease)   . Obstructive sleep apnea     On Cpap 2009  . Anxiety   . Depression   . Restless leg syndrome   . Edema   . Migraine   . Hyperlipidemia   . Anemia     previously followed by Dr. Dalene Carrow for anemia and elevated platelets  . Paresthesia     Dr. Antonietta Barcelona at [facility]  . Tremor     Dr. Antonietta Barcelona  . Gastroparesis     followed at [facility]  . Pneumonia     2012  . Plantar fasciitis 02/2011    R foot  . S/P endometrial ablation 08/09/2006    Novasure Ablation  . C. difficile colitis 10/01/12    treated by [facility]  . Dyssynergia     dyssynergenic defecation, contributing to fecal incontinence.  . Kidney stone     Past Surgical History  Procedure Laterality Date  . Tonsillectomy  1968  . Knee surgery  1999    R knee, Dr. Renae Fickle, torn cartilage  . Cholecystectomy  9/05  . Endometrial ablation  08/09/2006    Dr. Kingsley Plan Ablation    Family History  Problem Relation Age of Onset  . Heart disease Mother     pacemaker  . Allergies Mother   . Hypertension Mother   . Heart disease  Father   . Hypertension Father   . Diabetes Father   . Heart disease Paternal Grandmother   . Heart disease Paternal Grandfather   . Asthma Sister   . Irritable bowel syndrome Sister   . Allergies Sister   . Allergies Sister     History  Substance Use Topics  . Smoking status: Never Smoker   . Smokeless tobacco: Never Used  . Alcohol Use: No    OB History   Grav Para Term Preterm Abortions TAB SAB Ect Mult Living   1 1 1       1       Review of Systems  Constitutional: Positive for diaphoresis.  Cardiovascular: Positive for chest pain. Negative for palpitations and leg swelling.  Neurological: Negative for loss of consciousness.    Allergies  Erythromycin; Tramadol; and Dilaudid  Home Medications   Current Outpatient Rx  Name  Route  Sig  Dispense  Refill  . ALPRAZolam (XANAX) 0.25 MG tablet   Oral   Take 0.25 mg by mouth 2 (two) times daily as needed. For anxiety         . ARIPiprazole (ABILIFY) 2 MG tablet   Oral  Take 4 mg by mouth every morning.          . Armodafinil (NUVIGIL) 250 MG tablet   Oral   Take 250 mg by mouth every morning.          . cetirizine (ZYRTEC) 10 MG tablet   Oral   Take 10 mg by mouth at bedtime.         . clonazePAM (KLONOPIN) 0.5 MG tablet   Oral   Take 0.5-1 mg by mouth at bedtime.         Marland Kitchen dexlansoprazole (DEXILANT) 60 MG capsule   Oral   Take 60 mg by mouth daily.          Marland Kitchen dronabinol (MARINOL) 5 MG capsule   Oral   Take 10 mg by mouth 2 (two) times daily before a meal.          . DULoxetine (CYMBALTA) 60 MG capsule   Oral   Take 60 mg by mouth 2 (two) times daily.         . ferrous sulfate 325 (65 FE) MG EC tablet   Oral   Take 325 mg by mouth 2 (two) times daily.          . fluticasone (FLONASE) 50 MCG/ACT nasal spray   Nasal   Place 1 spray into the nose daily.         . hydrOXYzine (ATARAX/VISTARIL) 25 MG tablet   Oral   Take 12.5-50 mg by mouth every 6 (six) hours as needed  (itching).         . methocarbamol (ROBAXIN) 500 MG tablet   Oral   Take 500 mg by mouth 2 (two) times daily as needed (muscle spasms). For pain         . nortriptyline (PAMELOR) 10 MG capsule   Oral   Take 40 mg by mouth at bedtime.          . pregabalin (LYRICA) 50 MG capsule   Oral   Take 50 mg by mouth 2 (two) times daily.         . Probiotic Product (PHILLIPS COLON HEALTH) CAPS   Oral   Take 1 capsule by mouth daily.         . progesterone (PROMETRIUM) 100 MG capsule   Oral   Take 100 mg by mouth at bedtime.         . promethazine (PHENERGAN) 25 MG suppository   Rectal   Place 25 mg rectally as needed (nausea).          . rosuvastatin (CRESTOR) 20 MG tablet   Oral   Take 20 mg by mouth daily.         . sucralfate (CARAFATE) 1 GM/10ML suspension   Oral   Take 1 g by mouth 4 (four) times daily -  with meals and at bedtime.          . topiramate (TOPAMAX) 50 MG tablet   Oral   Take 50 mg by mouth at bedtime.          . triamterene-hydrochlorothiazide (DYAZIDE) 37.5-25 MG per capsule   Oral   Take 1 each (1 capsule total) by mouth daily.   90 capsule   1   . HYDROcodone-acetaminophen (NORCO/VICODIN) 5-325 MG per tablet      1-2 tablets po q 6 hours prn moderate to severe pain   20 tablet   0   . ibuprofen (ADVIL,MOTRIN) 600 MG tablet   Oral  Take 1 tablet (600 mg total) by mouth every 12 (twelve) hours.   10 tablet   0   . ondansetron (ZOFRAN) 4 MG tablet   Oral   Take 4 mg by mouth every 8 (eight) hours as needed (nausea).            BP 96/72  Pulse 111  Temp(Src) 98.1 F (36.7 C) (Oral)  Resp 18  SpO2 99%  LMP 07/27/2006  Physical Exam  Nursing note and vitals reviewed. Constitutional: She is oriented to person, place, and time. She appears well-developed and well-nourished. She appears distressed.  HENT:  Head: Normocephalic.  Neck: Normal range of motion. Neck supple.  Cardiovascular: Normal rate, regular rhythm,  normal heart sounds and intact distal pulses.   Pulmonary/Chest: Effort normal and breath sounds normal. She exhibits tenderness.  Neurological: She is alert and oriented to person, place, and time.  Skin: She is diaphoretic.  Psychiatric: She has a normal mood and affect.    ED Course  Procedures (including critical care time)  Labs Reviewed - No data to display No results found.   1. Cardiac arrhythmia       MDM  ecg- ivcd, wide complex rhythm., junctional tach. Discussed with dr berry--nonstemi. Sent to ER for eval.        Linna Hoff, MD 02/19/13 1610  Linna Hoff, MD 02/19/13 2005  Linna Hoff, MD 02/19/13 2005

## 2013-02-19 NOTE — ED Notes (Addendum)
Per [facility] and pt: Pt [facility] transfer for EKG changes and possible STEMI pt. pt fell earlier this morning landing on her chest. Pt was sore and throughout the day and later this afternoon she started having increased soreness and CP, Back pain, dizziness and a HA. Pt states that her problem now is dizziness.

## 2013-02-19 NOTE — ED Notes (Signed)
Family at bedside. 

## 2013-02-20 ENCOUNTER — Encounter: Payer: Self-pay | Admitting: Medical

## 2013-02-20 ENCOUNTER — Ambulatory Visit (INDEPENDENT_AMBULATORY_CARE_PROVIDER_SITE_OTHER): Payer: 59 | Admitting: Medical

## 2013-02-20 VITALS — BP 122/82 | HR 88 | Temp 98.1°F | Resp 18 | Wt 218.0 lb

## 2013-02-20 DIAGNOSIS — E860 Dehydration: Secondary | ICD-10-CM

## 2013-02-20 DIAGNOSIS — R0602 Shortness of breath: Secondary | ICD-10-CM

## 2013-02-20 DIAGNOSIS — N289 Disorder of kidney and ureter, unspecified: Secondary | ICD-10-CM

## 2013-02-20 DIAGNOSIS — R112 Nausea with vomiting, unspecified: Secondary | ICD-10-CM

## 2013-02-20 DIAGNOSIS — R7989 Other specified abnormal findings of blood chemistry: Secondary | ICD-10-CM

## 2013-02-20 LAB — URINALYSIS, ROUTINE W REFLEX MICROSCOPIC
Glucose, UA: NEGATIVE mg/dL
Hgb urine dipstick: NEGATIVE
Ketones, ur: 15 mg/dL — AB
Nitrite: NEGATIVE
Protein, ur: NEGATIVE mg/dL
Specific Gravity, Urine: 1.033 — ABNORMAL HIGH (ref 1.005–1.030)
Urobilinogen, UA: 1 mg/dL (ref 0.0–1.0)
pH: 5.5 (ref 5.0–8.0)

## 2013-02-20 LAB — URINE MICROSCOPIC-ADD ON

## 2013-02-20 NOTE — ED Provider Notes (Signed)
I saw and evaluated the patient, reviewed the resident's note and I agree with the findings and plan. I have reviewed EKG and agree with the resident interpretation.   Patient presented to urgent care due to orthostasis, syncope, tachycardia.  Patient states that she had a GI bug earlier this week with vomiting and diarrhea and states today she was getting up and size. She was seen in urgent care initially just to get x-rays done that was found to be tachycardic. Labs indicate dehydration but otherwise within normal limits. EKG shows sinus tachycardia.  After IV fluids patient feeling much better and is able to ambulate without symptoms. Patient discharged home  Gwyneth Sprout, MD 02/20/13 [phone]

## 2013-02-21 ENCOUNTER — Encounter: Payer: Self-pay | Admitting: Medical

## 2013-02-21 NOTE — Progress Notes (Signed)
Subjective: Here for emergency dept f/u.  Here with her husband today.  Was seen at [facility] ED last night, sent by urgent care for abnormal EKG along with her symptoms of 4 day hx/o nausea, vomiting, diarrhea, weak feeling.  Had labs, EKG and other eval last night, but was told that she did not have a heart attack, but symptoms related to her dehydration.  Was given 2 bags of IV fluids and felt much better.  She is here because she has had some SOB throughout the day with activity, but relieved with rest.  She called in asking for inhaler earlier today.  Was advised to come in. The nausea is mild now and less frequent.  No additional diarrhea or abdominal pain or vomiting.  Tuesday and Wednesday had several 5+ episodes of both vomiting and diarrhea with +sick contacts with similar, but the diarrhea and vomiting have resolved.      Past Medical History  Diagnosis Date  . Fibromyalgia   . GERD (gastroesophageal reflux disease)   . Obstructive sleep apnea     On Cpap 2009  . Anxiety   . Depression   . Restless leg syndrome   . Edema   . Migraine   . Hyperlipidemia   . Anemia     previously followed by Dr. Dalene Carrow for anemia and elevated platelets  . Paresthesia     Dr. Antonietta Barcelona at [facility]  . Tremor     Dr. Antonietta Barcelona  . Gastroparesis     followed at [facility]  . Pneumonia     2012  . Plantar fasciitis 02/2011    R foot  . S/P endometrial ablation 08/09/2006    Novasure Ablation  . C. difficile colitis 10/01/12    treated by [facility]  . Dyssynergia     dyssynergenic defecation, contributing to fecal incontinence.  . Kidney stone    ROS as in subjective   Objective: Filed Vitals:   02/20/13 1529  BP: 122/82  Pulse: 88  Temp: 98.1 F (36.7 C)  Resp: 18    General appearance: alert, no distress, wd/wn Skin: normal turgor, no cyanosis, no pallor HEENT: normocephalic, sclerae anicteric, TMs pearly, nares patent, no discharge or erythema, pharynx normal Oral cavity: MMM, no  lesions Neck: supple, no lymphadenopathy, no thyromegaly, no masses, no JVD Heart: RRR, normal S1, S2, no murmurs Lungs: CTA bilaterally, no wheezes, rhonchi, or rales Abdomen: +bs, soft, non tender, non distended, no masses, no hepatomegaly, no splenomegaly Pulses: 2+ symmetric, upper and lower extremities, normal cap refill Neuro: CN2-12 intact, nonfocal exam Ext: no edema  Assessment: Encounter Diagnoses  Name Primary?  . Dehydration Yes  . SOB (shortness of breath)   . Renal insufficiency   . Nausea and vomiting   . Abnormal CBC     Plan: Reviewed ED report, labs, the abnormal EKG from last night.   She clinically is much improved.  Lungs are clear.  Advised that inhaler not indicated at this time.  Advised she rest the next 2-3 days, continue to work on hydration as we discussed.  Declines anti nausea medication at this time.  Recheck early next week for labs for BMET and CBC given the mild renal insufficiency and abnormal CBC, both likely related to the recent dehydration.  However, if SOB continues, then return for recheck here, or if SOB worsens over the weekend, return to the ED.

## 2013-02-23 ENCOUNTER — Encounter: Payer: Self-pay | Admitting: Medical

## 2013-02-23 ENCOUNTER — Other Ambulatory Visit: Payer: 59

## 2013-02-23 ENCOUNTER — Ambulatory Visit (INDEPENDENT_AMBULATORY_CARE_PROVIDER_SITE_OTHER): Payer: 59 | Admitting: Medical

## 2013-02-23 VITALS — BP 82/60 | HR 88 | Temp 98.1°F | Resp 18 | Wt 219.0 lb

## 2013-02-23 DIAGNOSIS — R0602 Shortness of breath: Secondary | ICD-10-CM

## 2013-02-23 DIAGNOSIS — E860 Dehydration: Secondary | ICD-10-CM

## 2013-02-23 DIAGNOSIS — R7989 Other specified abnormal findings of blood chemistry: Secondary | ICD-10-CM

## 2013-02-23 DIAGNOSIS — W19XXXS Unspecified fall, sequela: Secondary | ICD-10-CM

## 2013-02-23 DIAGNOSIS — N289 Disorder of kidney and ureter, unspecified: Secondary | ICD-10-CM

## 2013-02-23 DIAGNOSIS — IMO0002 Reserved for concepts with insufficient information to code with codable children: Secondary | ICD-10-CM

## 2013-02-23 DIAGNOSIS — S20212S Contusion of left front wall of thorax, sequela: Secondary | ICD-10-CM

## 2013-02-23 LAB — POCT URINALYSIS DIPSTICK
Bilirubin, UA: NEGATIVE
Blood, UA: NEGATIVE
Glucose, UA: NEGATIVE
Ketones, UA: NEGATIVE
Leukocytes, UA: NEGATIVE
Nitrite, UA: NEGATIVE
Protein, UA: NEGATIVE
Spec Grav, UA: 1.02
Urobilinogen, UA: NEGATIVE
pH, UA: 5

## 2013-02-23 LAB — CBC WITH DIFFERENTIAL/PLATELET
Basophils Absolute: 0.1 10*3/uL (ref 0.0–0.1)
Basophils Relative: 1 % (ref 0–1)
Eosinophils Absolute: 0.3 10*3/uL (ref 0.0–0.7)
Eosinophils Relative: 3 % (ref 0–5)
HCT: 37.6 % (ref 36.0–46.0)
Hemoglobin: 12.5 g/dL (ref 12.0–15.0)
Lymphocytes Relative: 20 % (ref 12–46)
Lymphs Abs: 2.4 10*3/uL (ref 0.7–4.0)
MCH: 28.3 pg (ref 26.0–34.0)
MCHC: 33.2 g/dL (ref 30.0–36.0)
MCV: 85.3 fL (ref 78.0–100.0)
Monocytes Absolute: 0.9 10*3/uL (ref 0.1–1.0)
Monocytes Relative: 8 % (ref 3–12)
Neutro Abs: 8.7 10*3/uL — ABNORMAL HIGH (ref 1.7–7.7)
Neutrophils Relative %: 68 % (ref 43–77)
Platelets: 402 10*3/uL — ABNORMAL HIGH (ref 150–400)
RBC: 4.41 MIL/uL (ref 3.87–5.11)
RDW: 15.6 % — ABNORMAL HIGH (ref 11.5–15.5)
WBC: 12.5 10*3/uL — ABNORMAL HIGH (ref 4.0–10.5)

## 2013-02-23 LAB — BASIC METABOLIC PANEL
BUN: 17 mg/dL (ref 6–23)
CO2: 26 mEq/L (ref 19–32)
Calcium: 8.7 mg/dL (ref 8.4–10.5)
Chloride: 105 mEq/L (ref 96–112)
Creat: 0.93 mg/dL (ref 0.50–1.10)
Glucose, Bld: 94 mg/dL (ref 70–99)
Potassium: 3.6 mEq/L (ref 3.5–5.3)
Sodium: 141 mEq/L (ref 135–145)

## 2013-02-23 NOTE — Progress Notes (Signed)
Subjective: Here for recheck, lab draw today from Friday's visit.  She notes that she rested over the weekend, significantly increased water intake.  Has some SOB, but not like it was last week.  Last week prior to the ED visit she had fell coming out of the closet.  She landed face down, but did hit more on the left side of her body.  Had went to the ED for the fall, nausea, vomiting, and other symptoms as well.  She has continued to be sore, but over the weekend she has had pain in left side started Saturday 2 days ago.   She notes pain in left rib cage and abdomen region.   Denies burning with urination, no frequency, no urgency, no blood in urine.  No odor or cloudy urine.  No further vomiting or diarrhea.    Nausea is improved.  No problems with constipation, diarrhea, blood in stool.   If on her feet for a while, gets pain in left leg in general.   Left arm has been a little sore, hurt last night.    Had kidney stone the week before last, has some pain medication left over from that ED visit, but hasn't used the pain medication.  Regarding the abnormal EKG at the hospital, she has seen cardiology with stress test several years ago.  Denies chest pain, palpitations, edema.  Just some occasional SOB.   Past Medical History  Diagnosis Date  . Fibromyalgia   . GERD (gastroesophageal reflux disease)   . Obstructive sleep apnea     On Cpap 2009  . Anxiety   . Depression   . Restless leg syndrome   . Edema   . Migraine   . Hyperlipidemia   . Anemia     previously followed by Dr. Dalene Carrow for anemia and elevated platelets  . Paresthesia     Dr. Antonietta Barcelona at [facility]  . Tremor     Dr. Antonietta Barcelona  . Gastroparesis     followed at [facility]  . Pneumonia     2012  . Plantar fasciitis 02/2011    R foot  . S/P endometrial ablation 08/09/2006    Novasure Ablation  . C. difficile colitis 10/01/12    treated by [facility]  . Dyssynergia     dyssynergenic defecation, contributing to fecal incontinence.   . Kidney stone    ROS as in subjective  Objective: General appearance: alert, no distress, wd/wn Skin: normal turgor, no cyanosis, no pallor HEENT: normocephalic, sclerae anicteric, TMs pearly, nares patent, no discharge or erythema, pharynx normal Oral cavity: MMM, no lesions Neck: supple, no lymphadenopathy, no thyromegaly, no masses, no JVD Chest: tender left lower ribs/chest wall Heart: RRR, normal S1, S2, no murmurs Lungs: CTA bilaterally, no wheezes, rhonchi, or rales Abdomen: +bs, soft, non tender, non distended, no masses, no hepatomegaly, no splenomegaly Pulses: 2+ symmetric, upper and lower extremities, normal cap refill Neuro: CN2-12 intact, nonfocal exam Ext: no edema  Assessment: Encounter Diagnoses  Name Primary?  . Renal insufficiency Yes  . Abnormal CBC   . Rib contusion, left, sequela   . Fall at home, sequela   . Dehydration   . SOB (shortness of breath)     Plan: Labs today.  Other than the low BP, she clinically is much improved.   Lungs are clear. Stop Propranolol (for tremor) and Dyazide (for edema) currently, check BP every day this week, and we won't plan to restart dyazide until BP is back 120/80  range.   C/t hydration, rest, and f/u pending labs.   Discussed that rib contusion takes some time (weeks) to heal.  Can use ice pack or heat pad intermittently for the chest wall.

## 2013-02-23 NOTE — Patient Instructions (Signed)
Continue to hydrate well with water, Gatorade, soup, etc.   Your blood pressure is still low.     For now, hold OFF on the Dyazide and Propranolol until you are feeling back to normal and blood pressure is back to normal.  Normal BP is 120/80.    Take deep breaths every hour.  You can use the Vicodin as needed for worse pain, or OTC Aleve or Ibuprofen for rib pain.  I do believe you have bruised the left lower rib cage.  If the shortness of breath continues to be a problem as the week goes on, let us know.

## 2013-02-26 ENCOUNTER — Encounter: Payer: Self-pay | Admitting: Gynecology

## 2013-02-26 ENCOUNTER — Ambulatory Visit (INDEPENDENT_AMBULATORY_CARE_PROVIDER_SITE_OTHER): Payer: 59 | Admitting: Gynecology

## 2013-02-26 VITALS — BP 130/80 | Ht 62.0 in | Wt 211.0 lb

## 2013-02-26 DIAGNOSIS — Z79890 Hormone replacement therapy (postmenopausal): Secondary | ICD-10-CM

## 2013-02-26 DIAGNOSIS — Z01419 Encounter for gynecological examination (general) (routine) without abnormal findings: Secondary | ICD-10-CM

## 2013-02-26 NOTE — Patient Instructions (Signed)
Followup in one year for annual gynecologic exam. Call if menopausal symptoms such as hot flashes return and you want to consider restarting hormone replacement.

## 2013-02-26 NOTE — Progress Notes (Signed)
SHAWNTAE LOWY [birth year] [id number]        57 y.o.  [name] for annual exam.  Several issues noted below.  Past medical history,surgical history, medications, allergies, family history and social history were all reviewed and documented in the EPIC chart. ROS:  Was performed and pertinent positives and negatives are included in the history.  Exam: Kim assistant Filed Vitals:   02/26/13 0932  BP: 130/80  Height: 5\' 2"  (1.575 m)  Weight: 211 lb (95.709 kg)   General appearance  Normal Skin grossly normal Head/Neck normal with no cervical or supraclavicular adenopathy thyroid normal Lungs  clear Cardiac RR, without RMG Abdominal  soft, nontender, without masses, organomegaly or hernia Breasts  examined lying and sitting without masses, retractions, discharge or axillary adenopathy. Pelvic  Ext/BUS/vagina  normal   Cervix  normal   Uterus  grossly normal size, nontender. Exam limited by abdominal girth  Adnexa  Without gross masses or tenderness.    Anus and perineum  normal   Rectovaginal  normal sphincter tone without palpated masses or tenderness.    Assessment/Plan:  57 y.o. G36P1001 female for annual exam.   1. HRT. Patient had been on patch estrogen and Prometrium nightly. Apparently her patches were not staying on as a different generic and she stopped them several months ago but continued on the Prometrium 100 mg nightly. Is not having hot flashes night sweats vaginal bleeding or any other issues. It recommended she stop the Prometrium will monitor symptoms. As long as she remains without significant menopausal symptoms then will follow. If they return and she wants to restart HRT she'll call. I reviewed the risks as previously discussed to include stroke heart attack DVT breast cancer. Transdermal versus oral first pass effect advantages also discussed. 2. Postmenopausal. Without significant symptoms such as vaginal dryness dyspareunia. We'll continue to monitor. 3. Mammography  12/2012. Continue with annual mammography. As the enough and reviewed. 4. Pap smear 2013. No Pap smear done today. No history of significant abnormal Pap smears. Plan repeat Pap 3 year interval. 5. Colonoscopy 2013. Repeat at their recommended interval. 6. DEXA 2010. Results unknown as ordered through her primary physician's office. Recommend she discuss this with them as far as when to repeat this. Increase calcium vitamin D reviewed. 7. Health maintenance. No lab work done today as it is all done through her primary physician's office who follows her for her multiple medical issues. Followup in one year, sooner as needed.    Dara Lords MD, 10:01 AM 02/26/2013

## 2013-03-07 ENCOUNTER — Other Ambulatory Visit: Payer: Self-pay | Admitting: Family Medicine

## 2013-03-16 ENCOUNTER — Telehealth: Payer: Self-pay | Admitting: Family Medicine

## 2013-03-16 ENCOUNTER — Encounter: Payer: Self-pay | Admitting: Family Medicine

## 2013-03-16 ENCOUNTER — Ambulatory Visit (INDEPENDENT_AMBULATORY_CARE_PROVIDER_SITE_OTHER): Payer: 59 | Admitting: Family Medicine

## 2013-03-16 VITALS — BP 132/72 | HR 72 | Ht 62.5 in | Wt 216.0 lb

## 2013-03-16 DIAGNOSIS — G4733 Obstructive sleep apnea (adult) (pediatric): Secondary | ICD-10-CM | POA: Insufficient documentation

## 2013-03-16 NOTE — Patient Instructions (Addendum)
Please call our office with the information (ie phone number and/or fax) for the equipment supplier for your CPAP machine (?[employer]?) so that we can get appropriate forms and information to them.

## 2013-03-16 NOTE — Telephone Encounter (Signed)
Called [employer] to see if they could provide a Face to Face form for you to fill out. They do not have an actual form. I spoke with Jasmine December and she said that all that needs to be done for a replacement CPAP machine is to have Dr.Knapp document in her OV note from today that patient is compliant with the use of her CPAP machine and what happened with current machine as to why she is in need of a new/replacement machine along with an rx for a new CPAP and to fax these to [phone] attn:Sharon. Thanks.

## 2013-03-16 NOTE — Progress Notes (Signed)
Chief Complaint  Patient presents with  . Advice Only    face to face for new CPAP machine.    Patient has known OSA, and has been on CPAP since 2009.  Her machine started making a noise about a year ago.  She tried cleaning it, and doing everything that was suggested, but it didn't work; Engineer, site working, and so she hasn't been able to use CPAP machine for quite a few months. Prior to malfunction (where it would start making a terrible noise after using it for just a short time), she reports being compliant with CPAP machine.  She presents today for face-to-face visit that is required in order to get new CPAP machine.    Her husband notices some "breath holding" at night, apnea.  She has other issues which effects her energy, with chronic fatigue.  She feels even more tired than usual during the day, since not using CPAP machine.  Got letter Thursday from disability insurance, being denied.  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 She thought that I had agreed with the recommendation to d/c her disability.  We briefly reviewed that report that was received by me, and that I did not respond to it (it appeared to be FYI, no action required on my part). We talked about being disabled to do any work, vs being unable to do previous job.  Past Medical History  Diagnosis Date  . Fibromyalgia   . GERD (gastroesophageal reflux disease)   . Obstructive sleep apnea     On Cpap 2009  . Anxiety   . Depression   . Restless leg syndrome   . Edema   . Migraine   . Hyperlipidemia   . Anemia     previously followed by Dr. Dalene Carrow for anemia and elevated platelets  . Paresthesia     Dr. Antonietta Barcelona at [facility]  . Tremor     Dr. Antonietta Barcelona  . Gastroparesis     followed at [facility]  . Pneumonia     2012  . Plantar fasciitis 02/2011    R foot  . S/P endometrial ablation 08/09/2006    Novasure Ablation  . C. difficile colitis 10/01/12    treated by [facility]  .  Dyssynergia     dyssynergenic defecation, contributing to fecal incontinence.  . Kidney stone    Past Surgical History  Procedure Laterality Date  . Tonsillectomy  1968  . Knee surgery  1999    R knee, Dr. Renae Fickle, torn cartilage  . Cholecystectomy  9/05  . Endometrial ablation  08/09/2006    Dr. Kingsley Plan Ablation   History   Social History  . Marital Status: Married    Spouse Name: N/A    Number of Children: 2  . Years of Education: N/A   Occupational History  . customer service (on disability) [employer]   Social History Main Topics  . Smoking status: Never Smoker   . Smokeless tobacco: Never Used  . Alcohol Use: No  . Drug Use: No  . Sexually Active: Yes    Birth Control/ Protection: Post-menopausal   Other Topics Concern  . Not on file   Social History Narrative  . No narrative on file   Current Outpatient Prescriptions on File Prior to Visit  Medication Sig Dispense Refill  . ALPRAZolam (XANAX) 0.25 MG tablet Take 0.25 mg by mouth 2 (two) times daily as needed. For anxiety      .  ARIPiprazole (ABILIFY) 2 MG tablet Take 4 mg by mouth every morning. 5 mg.      . Armodafinil (NUVIGIL) 250 MG tablet Take 250 mg by mouth every morning.       . cetirizine (ZYRTEC) 10 MG tablet Take 10 mg by mouth at bedtime.      . clonazePAM (KLONOPIN) 0.5 MG tablet Take 0.5-1 mg by mouth at bedtime.      . dronabinol (MARINOL) 5 MG capsule Take 10 mg by mouth 2 (two) times daily before a meal.       . DULoxetine (CYMBALTA) 60 MG capsule Take 60 mg by mouth 2 (two) times daily.      . ferrous sulfate 325 (65 FE) MG EC tablet Take 325 mg by mouth 2 (two) times daily.       . fluticasone (FLONASE) 50 MCG/ACT nasal spray Place 1 spray into the nose daily.      . methocarbamol (ROBAXIN) 500 MG tablet Take 500 mg by mouth 2 (two) times daily as needed (muscle spasms). For pain      . nortriptyline (PAMELOR) 10 MG capsule Take 40 mg by mouth at bedtime.       . ondansetron  (ZOFRAN) 4 MG tablet Take 4 mg by mouth every 8 (eight) hours as needed (nausea).       . pregabalin (LYRICA) 50 MG capsule Take 50 mg by mouth 2 (two) times daily.      . Probiotic Product (PHILLIPS COLON HEALTH) CAPS Take 1 capsule by mouth daily.      . propranolol (INDERAL) 20 MG tablet Take 20 mg by mouth 3 (three) times daily.      . rosuvastatin (CRESTOR) 20 MG tablet Take 20 mg by mouth daily.      Marland Kitchen topiramate (TOPAMAX) 50 MG tablet Take 50 mg by mouth at bedtime.       . triamterene-hydrochlorothiazide (DYAZIDE) 37.5-25 MG per capsule Take 1 each (1 capsule total) by mouth daily.  90 capsule  1  . hydrOXYzine (ATARAX/VISTARIL) 25 MG tablet Take 12.5-50 mg by mouth every 6 (six) hours as needed (itching).      Marland Kitchen ibuprofen (ADVIL,MOTRIN) 600 MG tablet Take 1 tablet (600 mg total) by mouth every 12 (twelve) hours.  10 tablet  0  . [DISCONTINUED] carbamazepine (TEGRETOL XR) 100 MG 12 hr tablet Take 200 mg by mouth daily.        . [DISCONTINUED] Eszopiclone 3 MG TABS Take 3 mg by mouth at bedtime. Take immediately before bedtime       No current facility-administered medications on file prior to visit.   Allergies  Allergen Reactions  . Erythromycin Nausea Only  . Tramadol Itching  . Dilaudid (Hydromorphone Hcl) Itching   ROS:  Some increasing pain in R knee, especially after sitting for a while.  She made appt with ortho, but not until June (with Dr Lequita Halt).  Denies edema, giving way.  Denies fevers, URI symptoms, chest pain, shortness of breath, fevers, URI symptoms or other complaints.  PHYSICAL EXAM: BP 132/72  Pulse 72  Ht 5' 2.5" (1.588 m)  Wt 216 lb (97.977 kg)  BMI 38.85 kg/m2  LMP 07/27/2006 Pleasant, obese, slightly cushingoid female in no distress Neck: no lymphadenopathy or mass Heart: regular rate and rhythm Lungs: clear Extremities: no edema Skin: no rash  Knee exam not performed.  ASSESSMENT/PLAN: OSA (obstructive sleep apnea) - not currently on CPAP due to  malfunctioning machine. Needs new CPAP  Will fax  this note to [facility], as evidence of face to face encounter as required, along with rx, for CPAP machine.  She had been compliant with care when machine functioning properly, and recommend she continue CPAP therapy.  Recommended increased activity (water aerobics, walking on flat, soft surfaces--stop walking if causes increased pain and swelling in the knee) And to see other ortho or PA within Dr. Deri Fuelling office, rather than waiting over 2 months for appointment, for ongoing/worsening knee pain.  Weight loss and daily exercise encouraged.  Discussed the denial of her disability, and that I had not commented on report to anyone.  She reports that financially she is okay, since collecting social security. Discussed ability to do previous job, versus ANY job.  Her concern was how unreliable she is, given that she would frequently miss work due to illness (vomiting), and that she didn't think she could get a job, being so unreliable with her work schedule.

## 2013-03-16 NOTE — Telephone Encounter (Signed)
[name] called and states you needed phone number for [facility] for her cpap [phone]

## 2013-03-17 NOTE — Telephone Encounter (Signed)
OV note done.  Please fax along with rx to [facility] as per your note

## 2013-03-18 ENCOUNTER — Telehealth: Payer: Self-pay | Admitting: *Deleted

## 2013-03-18 NOTE — Telephone Encounter (Signed)
Order signed.

## 2013-03-18 NOTE — Telephone Encounter (Signed)
[name] with [facility] called stated that Dr.Knapp did a phenomenal job on OV. They are in need of actual rx order for new CPAP machine inclusive of the setting and supplies. Thanks.

## 2013-04-10 ENCOUNTER — Telehealth: Payer: Self-pay | Admitting: *Deleted

## 2013-04-10 MED ORDER — PROGESTERONE MICRONIZED 100 MG PO CAPS: ORAL_CAPSULE | ORAL | Status: DC

## 2013-04-10 MED ORDER — ESTRADIOL 0.05 MG/24HR TD PTTW: 1.0000 | MEDICATED_PATCH | TRANSDERMAL | Status: DC

## 2013-04-10 NOTE — Telephone Encounter (Signed)
Pt called stating she would like to start back on her HRT and told to call if she would like to do this. Pt is not having any bleeding, just hot all the time. Please advise

## 2013-04-10 NOTE — Telephone Encounter (Signed)
Okay for Minivelle 0.05 #8 refill x11 and Prometrium 100 mg each bedtime #30 refill 11

## 2013-04-10 NOTE — Telephone Encounter (Signed)
rx sent to pharmacy

## 2013-04-22 ENCOUNTER — Encounter: Payer: Self-pay | Admitting: Family Medicine

## 2013-04-22 ENCOUNTER — Other Ambulatory Visit: Payer: 59

## 2013-04-27 ENCOUNTER — Ambulatory Visit: Payer: 59 | Admitting: Family Medicine

## 2013-05-27 ENCOUNTER — Telehealth: Payer: Self-pay | Admitting: *Deleted

## 2013-05-27 ENCOUNTER — Other Ambulatory Visit: Payer: 59

## 2013-05-27 DIAGNOSIS — Z79899 Other long term (current) drug therapy: Secondary | ICD-10-CM

## 2013-05-27 DIAGNOSIS — E785 Hyperlipidemia, unspecified: Secondary | ICD-10-CM

## 2013-05-27 LAB — CBC WITH DIFFERENTIAL/PLATELET
Basophils Absolute: 0.1 10*3/uL (ref 0.0–0.1)
Basophils Relative: 1 % (ref 0–1)
Eosinophils Absolute: 0.2 10*3/uL (ref 0.0–0.7)
Eosinophils Relative: 1 % (ref 0–5)
HCT: 38.8 % (ref 36.0–46.0)
Hemoglobin: 12.9 g/dL (ref 12.0–15.0)
Lymphocytes Relative: 24 % (ref 12–46)
Lymphs Abs: 3.1 10*3/uL (ref 0.7–4.0)
MCH: 28.2 pg (ref 26.0–34.0)
MCHC: 33.2 g/dL (ref 30.0–36.0)
MCV: 84.9 fL (ref 78.0–100.0)
Monocytes Absolute: 0.9 10*3/uL (ref 0.1–1.0)
Monocytes Relative: 7 % (ref 3–12)
Neutro Abs: 8.5 10*3/uL — ABNORMAL HIGH (ref 1.7–7.7)
Neutrophils Relative %: 67 % (ref 43–77)
Platelets: 466 10*3/uL — ABNORMAL HIGH (ref 150–400)
RBC: 4.57 MIL/uL (ref 3.87–5.11)
RDW: 15.2 % (ref 11.5–15.5)
WBC: 12.8 10*3/uL — ABNORMAL HIGH (ref 4.0–10.5)

## 2013-05-27 LAB — COMPREHENSIVE METABOLIC PANEL
ALT: 22 U/L (ref 0–35)
AST: 17 U/L (ref 0–37)
Albumin: 3.9 g/dL (ref 3.5–5.2)
Alkaline Phosphatase: 116 U/L (ref 39–117)
BUN: 19 mg/dL (ref 6–23)
CO2: 30 mEq/L (ref 19–32)
Calcium: 9 mg/dL (ref 8.4–10.5)
Chloride: 100 mEq/L (ref 96–112)
Creat: 0.99 mg/dL (ref 0.50–1.10)
Glucose, Bld: 99 mg/dL (ref 70–99)
Potassium: 3.9 mEq/L (ref 3.5–5.3)
Sodium: 142 mEq/L (ref 135–145)
Total Bilirubin: 0.3 mg/dL (ref 0.3–1.2)
Total Protein: 6.9 g/dL (ref 6.0–8.3)

## 2013-05-27 LAB — LIPID PANEL
Cholesterol: 160 mg/dL (ref 0–200)
HDL: 54 mg/dL (ref >39)
LDL Cholesterol: 87 mg/dL (ref 0–99)
Total CHOL/HDL Ratio: 3 Ratio
Triglycerides: 97 mg/dL (ref <150)
VLDL: 19 mg/dL (ref 0–40)

## 2013-05-27 NOTE — Telephone Encounter (Signed)
Patient was in this morning for lipid and liver blood draw. She brought in requisition from Dr.Deveshwar and she was needing CMP and CBCD. I canceled the liver panel and added the CMP and CBCD, is this okay? I will send results to Dr.Deveshwar when they come in.

## 2013-05-27 NOTE — Telephone Encounter (Signed)
That's fine

## 2013-06-01 ENCOUNTER — Ambulatory Visit: Payer: Self-pay | Admitting: Family Medicine

## 2013-06-04 ENCOUNTER — Ambulatory Visit: Payer: Self-pay | Admitting: Family Medicine

## 2013-06-15 ENCOUNTER — Ambulatory Visit: Payer: Self-pay | Admitting: Family Medicine

## 2013-06-25 ENCOUNTER — Ambulatory Visit (INDEPENDENT_AMBULATORY_CARE_PROVIDER_SITE_OTHER): Payer: 59 | Admitting: Family Medicine

## 2013-06-25 ENCOUNTER — Encounter: Payer: Self-pay | Admitting: Family Medicine

## 2013-06-25 VITALS — BP 120/80 | HR 72 | Ht 62.5 in | Wt 226.0 lb

## 2013-06-25 DIAGNOSIS — R32 Unspecified urinary incontinence: Secondary | ICD-10-CM

## 2013-06-25 DIAGNOSIS — E785 Hyperlipidemia, unspecified: Secondary | ICD-10-CM

## 2013-06-25 DIAGNOSIS — R609 Edema, unspecified: Secondary | ICD-10-CM

## 2013-06-25 LAB — POCT URINALYSIS DIPSTICK
Bilirubin, UA: NEGATIVE
Blood, UA: NEGATIVE
Glucose, UA: NEGATIVE
Ketones, UA: NEGATIVE
Leukocytes, UA: NEGATIVE
Nitrite, UA: NEGATIVE
Protein, UA: NEGATIVE
Spec Grav, UA: <=1.005
Urobilinogen, UA: NEGATIVE
pH, UA: 7

## 2013-06-25 MED ORDER — ATORVASTATIN CALCIUM 40 MG PO TABS: 40.0000 mg | ORAL_TABLET | Freq: Every day | ORAL | Status: DC

## 2013-06-25 NOTE — Progress Notes (Signed)
Chief Complaint  Patient presents with  . Hyperlipidemia    follow up on lab work.    Hyperlipidemia follow-up:  Patient is reportedly following a low-fat, low cholesterol diet as best she can, diet unchanged.  Compliant with medications and denies medication side effects.  She was changed from Crestor to atorvastatin since last labs.  Denies any significant side effects.  Had labs done earlier in July, and presents for follow up.  The other labs done 7/2 were ordered by Dr. Corliss Skains, and results were went to her.  She denies feeling sick, fevers, signs of infection or any recent steroids prior to last labs (when WBC was elevated).  She has been having slight increase in urinary frequency and urinary incontinence in the last few weeks--see below.  She denies any skin rashes/lesions, URI symptoms, cough, dysuria, diarrhea, or other signs of infection.   Seeing Dr. Lequita Halt for torn cartilage in her right knee, Baker's cyst and arthritis (per pt--no records received).  She has some knee exercises, and had an injection.  Has had worse pain, and might need surgery in the future.  He put her back on tramadol, only taking is very sparingly, and hasn't really caused much itching (has caused in the past).  Neuro f/u in June--titrated up propranolol for tremor to 30mg , and recommended compression stockings for LE edema.  It overall is improved, still has some intermittent swelling L>R.  She hasn't used the stockings. She is wondering if she needs higher dose of diuretic.  Getting biofeedback for pelvic floor dyssynergy.  Having less fecal incontinence.  She had 2-3 episodes of urinary incontinence at night within one week, last week.  She has some urinary frequency, worse over the past few weeks. Denies dysuria.  Past Medical History  Diagnosis Date  . Fibromyalgia   . GERD (gastroesophageal reflux disease)   . Obstructive sleep apnea     On Cpap 2009  . Anxiety   . Depression   . Restless leg syndrome    . Edema   . Migraine   . Hyperlipidemia   . Anemia     previously followed by Dr. Dalene Carrow for anemia and elevated platelets  . Paresthesia     Dr. Antonietta Barcelona at [facility]  . Tremor     Dr. Antonietta Barcelona  . Gastroparesis     followed at [facility]  . Pneumonia     2012  . Plantar fasciitis 02/2011    R foot  . S/P endometrial ablation 08/09/2006    Novasure Ablation  . C. difficile colitis 10/01/12    treated by [facility]  . Dyssynergia     dyssynergenic defecation, contributing to fecal incontinence.  . Kidney stone   . Pelvic floor dysfunction     pelvic floor dyssynergy   Past Surgical History  Procedure Laterality Date  . Tonsillectomy  1968  . Knee surgery  1999    R knee, Dr. Renae Fickle, torn cartilage  . Cholecystectomy  9/05  . Endometrial ablation  08/09/2006    Dr. Kingsley Plan Ablation   History   Social History  . Marital Status: Married    Spouse Name: N/A    Number of Children: 2  . Years of Education: N/A   Occupational History  . customer service (on disability) [employer]   Social History Main Topics  . Smoking status: Never Smoker   . Smokeless tobacco: Never Used  . Alcohol Use: No  . Drug Use: No  . Sexually Active:  Yes    Birth Control/ Protection: Post-menopausal   Other Topics Concern  . Not on file   Social History Narrative  . No narrative on file   Current outpatient prescriptions:ALPRAZolam (XANAX) 0.25 MG tablet, Take 0.25 mg by mouth 2 (two) times daily as needed. For anxiety, Disp: , Rfl: ;  ARIPiprazole (ABILIFY) 2 MG tablet, Take 4 mg by mouth every morning. 5 mg., Disp: , Rfl: ;  Armodafinil (NUVIGIL) 250 MG tablet, Take 250 mg by mouth every morning. , Disp: , Rfl: ;  atorvastatin (LIPITOR) 40 MG tablet, Take 1 tablet (40 mg total) by mouth daily., Disp: 90 tablet, Rfl: 1 cetirizine (ZYRTEC) 10 MG tablet, Take 10 mg by mouth at bedtime., Disp: , Rfl: ;  clonazePAM (KLONOPIN) 0.5 MG tablet, Take 0.5-1 mg by mouth at bedtime., Disp:  , Rfl: ;  dronabinol (MARINOL) 5 MG capsule, Take 10 mg by mouth 2 (two) times daily before a meal. , Disp: , Rfl: ;  DULoxetine (CYMBALTA) 60 MG capsule, Take 60 mg by mouth 2 (two) times daily., Disp: , Rfl:  estradiol (MINIVELLE) 0.05 MG/24HR, Place 1 patch (0.05 mg total) onto the skin 2 (two) times a week., Disp: 8 patch, Rfl: 11;  ferrous sulfate 325 (65 FE) MG EC tablet, Take 325 mg by mouth 2 (two) times daily. , Disp: , Rfl: ;  fluticasone (FLONASE) 50 MCG/ACT nasal spray, Place 1 spray into the nose daily., Disp: , Rfl: ;  hydrOXYzine (ATARAX/VISTARIL) 25 MG tablet, Take 12.5-50 mg by mouth every 6 (six) hours as needed (itching)., Disp: , Rfl:  ibuprofen (ADVIL,MOTRIN) 600 MG tablet, Take 1 tablet (600 mg total) by mouth every 12 (twelve) hours., Disp: 10 tablet, Rfl: 0;  methocarbamol (ROBAXIN) 500 MG tablet, Take 500 mg by mouth 2 (two) times daily as needed (muscle spasms). For pain, Disp: , Rfl: ;  nortriptyline (PAMELOR) 10 MG capsule, Take 40 mg by mouth at bedtime. , Disp: , Rfl:  ondansetron (ZOFRAN) 4 MG tablet, Take 4 mg by mouth every 8 (eight) hours as needed (nausea). , Disp: , Rfl: ;  pantoprazole (PROTONIX) 40 MG tablet, Take 40 mg by mouth 2 (two) times daily., Disp: , Rfl: ;  pregabalin (LYRICA) 50 MG capsule, Take 50 mg by mouth 2 (two) times daily., Disp: , Rfl: ;  Probiotic Product (PHILLIPS COLON HEALTH) CAPS, Take 1 capsule by mouth daily., Disp: , Rfl:  progesterone (PROMETRIUM) 100 MG capsule, Take 1 tablet by mouth daily at bedtime., Disp: 30 capsule, Rfl: 11;  propranolol (INDERAL) 20 MG tablet, Take 20 mg by mouth 3 (three) times daily., Disp: , Rfl: ;  topiramate (TOPAMAX) 50 MG tablet, Take 50 mg by mouth at bedtime. , Disp: , Rfl: ;  traMADol (ULTRAM) 50 MG tablet, Take 1 tablet by mouth., Disp: , Rfl:  triamterene-hydrochlorothiazide (DYAZIDE) 37.5-25 MG per capsule, Take 1 each (1 capsule total) by mouth daily., Disp: 90 capsule, Rfl: 1;  Wheat Dextrin (BENEFIBER PO),  Take 12 g by mouth., Disp: , Rfl: ;  [DISCONTINUED] carbamazepine (TEGRETOL XR) 100 MG 12 hr tablet, Take 200 mg by mouth daily.  , Disp: , Rfl: ;  [DISCONTINUED] Eszopiclone 3 MG TABS, Take 3 mg by mouth at bedtime. Take immediately before bedtime, Disp: , Rfl:   Allergies  Allergen Reactions  . Erythromycin Nausea Only  . Tramadol Itching  . Dilaudid (Hydromorphone Hcl) Itching   ROS: see HPI for details.  Some mid-abdominal discomfort today.  Nausea/vomiting at her  baseline. No fevers, URI symptoms, cough.  +chronic pain. Tremor improved, but still present.  Moods stable.  PHYSICAL EXAM: BP 120/80  Pulse 72  Ht 5' 2.5" (1.588 m)  Wt 226 lb (102.513 kg)  BMI 40.65 kg/m2  LMP 07/27/2006 (weight was with knee brace and shoes on) Pleasant, obese female in no distress, appears comfortable Neck: no lymphadenopathy or mass Heart: regular rate and rhythm without murmur Lungs: clear bilaterally Abdomen: obese, nontender Neuro: alert and oriented. Very minimal resting hand tremor noted bilaterally Extremities: only trace edema bilaterally, 2+ pulse Skin: no rashes, lesions Psych: normal hygiene, grooming, eye contact, speech.  Normal mood, slightly flattened affect  Lab Results  Component Value Date   CHOL 160 05/27/2013   HDL 54 05/27/2013   LDLCALC 87 05/27/2013   TRIG 97 05/27/2013   CHOLHDL 3.0 05/27/2013     Chemistry      Component Value Date/Time   NA 142 05/27/2013 0915   K 3.9 05/27/2013 0915   CL 100 05/27/2013 0915   CO2 30 05/27/2013 0915   BUN 19 05/27/2013 0915   CREATININE 0.99 05/27/2013 0915   CREATININE 1.23* 02/19/2013 2111      Component Value Date/Time   CALCIUM 9.0 05/27/2013 0915   ALKPHOS 116 05/27/2013 0915   AST 17 05/27/2013 0915   ALT 22 05/27/2013 0915   BILITOT 0.3 05/27/2013 0915     Lab Results  Component Value Date   WBC 12.8* 05/27/2013   HGB 12.9 05/27/2013   HCT 38.8 05/27/2013   MCV 84.9 05/27/2013   PLT 466* 05/27/2013   Urine dip  normal  ASSESSMENT/PLAN:  Hyperlipidemia - Plan: atorvastatin (LIPITOR) 40 MG tablet  Urinary incontinence - Plan: Urinalysis Dipstick  Peripheral edema - use compression stockings as recommended.  reviewed low sodium diet.  I do NOT recommend increasing her diuretic, given her urinary complaints also reviewed possible risks (affecting electrolytes, lowering BP's) of higher diuretic, therefore was told to keep meds same.  Hyperlipidemia--Continue atorvastatin, lipids at goal. Urinary incontinence--no evidence of infection.  Continue PT Elevated WBC--no symptoms of active infection.  Plan to repeat CBC with next labs (6 months), sooner prn  F/u 6 months, sooner prn.  25 minute visit, more than 1/2 spent counseling

## 2013-06-25 NOTE — Patient Instructions (Addendum)
Continue your current medications.  Your cholesterol is well controlled on the atorvastatin. We are checking for a urinary infection today, to see if that could be why you are having accidents and increased urinary frequency.  We will be in touch if it shows infection that needs treatment.  We will see you back in 6 months.  We can either do labs at the visit (if you are fasting), order them at the visit (to return fasting), or have you call ahead of time to have orders put in for labs to be drawn prior to your visit.  We will need to know if you had labs done elsewhere (ie for Dr. Corliss Skains, or your other physicians) so we don't duplicate any labs

## 2013-06-26 ENCOUNTER — Encounter: Payer: Self-pay | Admitting: Family Medicine

## 2013-08-24 ENCOUNTER — Ambulatory Visit (INDEPENDENT_AMBULATORY_CARE_PROVIDER_SITE_OTHER): Payer: 59 | Admitting: Family Medicine

## 2013-08-24 ENCOUNTER — Encounter: Payer: Self-pay | Admitting: Family Medicine

## 2013-08-24 VITALS — BP 100/62 | HR 68 | Temp 97.7°F | Ht 62.5 in | Wt 213.0 lb

## 2013-08-24 DIAGNOSIS — J329 Chronic sinusitis, unspecified: Secondary | ICD-10-CM

## 2013-08-24 DIAGNOSIS — J309 Allergic rhinitis, unspecified: Secondary | ICD-10-CM

## 2013-08-24 MED ORDER — AMOXICILLIN 500 MG PO CAPS: 1000.0000 mg | ORAL_CAPSULE | Freq: Two times a day (BID) | ORAL | Status: DC

## 2013-08-24 NOTE — Progress Notes (Signed)
Chief Complaint  Patient presents with  . Cough    started with a cough on night of 08/06/13, also has nasal congestion that was clear and is now yellow-green x 3 days.    Started out with scratchy sore throat, cough and congestion back 2-3 weeks ago.  Her husband had been ill, and she got sick shortly after.  Sometimes it would seem like she was getting better, but then would get worse again.  Mucus had been clear, but nasal mucus is now yellow-green.  Slight headache today at [facility].  She hasn't been having sinus headaches.  She is having post-nasal drainage.  Cough started getting worse 2 days ago.  Denies any fevers.  She feels "really bad" today--chest hurts--congested and from coughing.  She has been taking Mucinex DM syrup (4x yesterday)--used less often when she was feeling better, used it pretty regularly last week.    Past Medical History  Diagnosis Date  . Fibromyalgia   . GERD (gastroesophageal reflux disease)   . Obstructive sleep apnea     On Cpap 2009  . Anxiety   . Depression   . Restless leg syndrome   . Edema   . Migraine   . Hyperlipidemia   . Anemia     previously followed by Dr. Dalene Carrow for anemia and elevated platelets  . Paresthesia     Dr. Antonietta Barcelona at [facility]  . Tremor     Dr. Antonietta Barcelona  . Gastroparesis     [facility] at [facility]  . Pneumonia     2012  . Plantar fasciitis 02/2011    R foot  . S/P endometrial ablation 08/09/2006    Novasure Ablation  . C. difficile colitis 10/01/12    treated by [facility]  . Dyssynergia     dyssynergenic defecation, contributing to fecal incontinence.  . Kidney stone   . Pelvic floor dysfunction     pelvic floor dyssynergy   Past Surgical History  Procedure Laterality Date  . Tonsillectomy  1968  . Knee surgery  1999    R knee, Dr. Renae Fickle, torn cartilage  . Cholecystectomy  9/05  . Endometrial ablation  08/09/2006    Dr. Kingsley Plan Ablation   History   Social History  . Marital Status: Married    Spouse  Name: N/A    Number of Children: 2  . Years of Education: N/A   Occupational History  . customer service (on disability) [employer]   Social History Main Topics  . Smoking status: Never Smoker   . Smokeless tobacco: Never Used  . Alcohol Use: No  . Drug Use: No  . Sexual Activity: Yes    Birth Control/ Protection: Post-menopausal   Other Topics Concern  . Not on file   Social History Narrative  . No narrative on file   Current outpatient prescriptions:ALPRAZolam (XANAX) 0.25 MG tablet, Take 0.25 mg by mouth 2 (two) times daily as needed. For anxiety, Disp: , Rfl: ;  ARIPiprazole (ABILIFY) 2 MG tablet, Take 4 mg by mouth every morning. 5 mg., Disp: , Rfl: ;  Armodafinil (NUVIGIL) 250 MG tablet, Take 250 mg by mouth every morning. , Disp: , Rfl: ;  atorvastatin (LIPITOR) 40 MG tablet, Take 1 tablet (40 mg total) by mouth daily., Disp: 90 tablet, Rfl: 1 cetirizine (ZYRTEC) 10 MG tablet, Take 10 mg by mouth at bedtime., Disp: , Rfl: ;  clonazePAM (KLONOPIN) 0.5 MG tablet, Take 0.5-1 mg by mouth at bedtime., Disp: , Rfl: ;  dronabinol (MARINOL) 5 MG capsule, Take 10 mg by mouth 2 (two) times daily before a meal. , Disp: , Rfl: ;  DULoxetine (CYMBALTA) 60 MG capsule, Take 60 mg by mouth 2 (two) times daily., Disp: , Rfl:  ferrous sulfate 325 (65 FE) MG EC tablet, Take 325 mg by mouth 2 (two) times daily. , Disp: , Rfl: ;  fluticasone (FLONASE) 50 MCG/ACT nasal spray, Place 1 spray into the nose daily., Disp: , Rfl: ;  hydrOXYzine (ATARAX/VISTARIL) 25 MG tablet, Take 12.5-50 mg by mouth every 6 (six) hours as needed (itching)., Disp: , Rfl:  methocarbamol (ROBAXIN) 500 MG tablet, Take 500 mg by mouth 2 (two) times daily as needed (muscle spasms). For pain, Disp: , Rfl: ;  nortriptyline (PAMELOR) 10 MG capsule, Take 40 mg by mouth at bedtime. , Disp: , Rfl: ;  pantoprazole (PROTONIX) 40 MG tablet, Take 40 mg by mouth 2 (two) times daily., Disp: , Rfl: ;  pregabalin (LYRICA) 50 MG capsule, Take 50  mg by mouth 2 (two) times daily., Disp: , Rfl:  Probiotic Product (PHILLIPS [employer]ON HEALTH) CAPS, Take 1 capsule by mouth daily., Disp: , Rfl: ;  propranolol (INDERAL) 20 MG tablet, Take 20 mg by mouth 3 (three) times daily., Disp: , Rfl: ;  topiramate (TOPAMAX) 50 MG tablet, Take 50 mg by mouth at bedtime. , Disp: , Rfl: ;  traMADol (ULTRAM) 50 MG tablet, Take 1 tablet by mouth., Disp: , Rfl:  triamterene-hydrochlorothiazide (DYAZIDE) 37.5-25 MG per capsule, Take 1 each (1 capsule total) by mouth daily., Disp: 90 capsule, Rfl: 1;  amoxicillin (AMOXIL) 500 MG capsule, Take 2 capsules (1,000 mg total) by mouth 2 (two) times daily., Disp: 40 capsule, Rfl: 0;  ibuprofen (ADVIL,MOTRIN) 600 MG tablet, Take 1 tablet (600 mg total) by mouth every 12 (twelve) hours., Disp: 10 tablet, Rfl: 0 ondansetron (ZOFRAN) 4 MG tablet, Take 4 mg by mouth every 8 (eight) hours as needed (nausea). , Disp: , Rfl: ;  Wheat Dextrin (BENEFIBER PO), Take 12 g by mouth., Disp: , Rfl: ;  [DISCONTINUED] carbamazepine (TEGRETOL XR) 100 MG 12 hr tablet, Take 200 mg by mouth daily.  , Disp: , Rfl: ;  [DISCONTINUED] Eszopiclone 3 MG TABS, Take 3 mg by mouth at bedtime. Take immediately before bedtime, Disp: , Rfl:  (amoxacillin rx'd today, not on prior to today's visit)  Allergies  Allergen Reactions  . Erythromycin Nausea Only  . Tramadol Itching  . Dilaudid [Hydromorphone Hcl] Itching   ROS:  Denies fevers, chills.  Denies diarrhea.  Nausea and vomiting have been better recently, just slight nausea this morning; has decreased appetite.  Denies abdominal pain, skin rashes, bleeding/bruising.  Denies shortness of breath, wheezing.  Some pain in chest from coughing.  No pleuritic chest pain or exertional chest pain.  PHYSICAL EXAM: BP 100/62  Pulse 68  Temp(Src) 97.7 F (36.5 C) (Oral)  Ht 5' 2.5" (1.588 m)  Wt 213 lb (96.616 kg)  BMI 38.31 kg/m2  LMP 07/27/2006 Well developed, pleasant, obese female with frequent sniffling and  dry cough HEENT:  PERRL, EOMI, conjunctiva clear.  OP clear. Sinuses--minimal tenderness over cheeks. Nasal mucosa moderately edematous, erythematous, whitish yellow mucus present in nares. Neck: no lymphadenopathy or mass Heart: regular rate and rhythm Lungs: clear bilaterally, no wheezes, rales, ronchi Chest:  Mild tenderness of chest wall over sternum Skin: no rashes Neuro: alert and oriented. Cranial nerves intact  ASSESSMENT/PLAN:  Sinusitis - Plan: amoxicillin (AMOXIL) 500 MG capsule  Allergic  rhinitis, cause unspecified  Continue zyrtec and flonase Use Amoxacillin for sinus infection. Continue use of nasal saline and sinus rinses Continue use of Mucinex to help with chest congestion and cough

## 2013-08-24 NOTE — Patient Instructions (Addendum)
  Continue zyrtec and flonase Use Amoxacillin for sinus infection. Continue use of nasal saline and sinus rinses Continue use of Mucinex to help with chest congestion and cough  Return in 1-2 weeks (at your convenience, when better) for nurse visit for flu shot

## 2013-09-08 ENCOUNTER — Other Ambulatory Visit: Payer: Self-pay | Admitting: Family Medicine

## 2013-09-08 NOTE — Telephone Encounter (Signed)
Ok for #60 with 2 refills

## 2013-09-08 NOTE — Telephone Encounter (Signed)
IS THIS OK 

## 2013-09-25 ENCOUNTER — Telehealth: Payer: Self-pay | Admitting: *Deleted

## 2013-09-25 MED ORDER — PROGESTERONE MICRONIZED 100 MG PO CAPS: 100.0000 mg | ORAL_CAPSULE | Freq: Every day | ORAL | Status: DC

## 2013-09-25 MED ORDER — ESTRADIOL 0.05 MG/24HR TD PTTW: 1.0000 | MEDICATED_PATCH | TRANSDERMAL | Status: DC

## 2013-09-25 NOTE — Telephone Encounter (Signed)
Pt calling c/o hot flashes back in full force, pt has been off HRT for some months now and would like to start back on Hrt. Pt was told to call when ready. Please advise

## 2013-09-25 NOTE — Telephone Encounter (Signed)
Left that rx has been sent on home #

## 2013-09-25 NOTE — Telephone Encounter (Signed)
Vivelle 0.05 mg patches twice weekly #8 refill through 02/2014. Prometrium 100 mg nightly #30 refill through 02/2014

## 2013-10-19 ENCOUNTER — Other Ambulatory Visit: Payer: Self-pay | Admitting: Family Medicine

## 2013-10-19 NOTE — Telephone Encounter (Signed)
Ok to refill #10 

## 2013-10-19 NOTE — Telephone Encounter (Signed)
Is this okay?

## 2013-12-05 ENCOUNTER — Other Ambulatory Visit: Payer: Self-pay | Admitting: Family Medicine

## 2013-12-07 ENCOUNTER — Telehealth: Payer: Self-pay | Admitting: *Deleted

## 2013-12-07 DIAGNOSIS — R7301 Impaired fasting glucose: Secondary | ICD-10-CM

## 2013-12-07 DIAGNOSIS — E7800 Pure hypercholesterolemia, unspecified: Secondary | ICD-10-CM

## 2013-12-07 DIAGNOSIS — D649 Anemia, unspecified: Secondary | ICD-10-CM

## 2013-12-07 DIAGNOSIS — Z79899 Other long term (current) drug therapy: Secondary | ICD-10-CM

## 2013-12-07 NOTE — Telephone Encounter (Signed)
Spoke with patient and she does need the imitrex, states that her headaches are definitely happening more often. She called her neuro and could not get in until March with Dr.Tonuzi. She is scheduled here 12/24/13 and would like to come in for labs a few days before if possible.

## 2013-12-07 NOTE — Telephone Encounter (Signed)
Is this okay to refill? 

## 2013-12-07 NOTE — Telephone Encounter (Signed)
She last filled #10 6 weeks ago (likely just got 9).  If her headache frequency is increasing, she should plan to f/u (either with her neurologist, or here--I believe she has a neurologist)

## 2013-12-07 NOTE — Telephone Encounter (Signed)
Future orders entered. 

## 2013-12-22 ENCOUNTER — Other Ambulatory Visit: Payer: 59

## 2013-12-22 DIAGNOSIS — R7301 Impaired fasting glucose: Secondary | ICD-10-CM

## 2013-12-22 DIAGNOSIS — E7800 Pure hypercholesterolemia, unspecified: Secondary | ICD-10-CM

## 2013-12-22 DIAGNOSIS — D649 Anemia, unspecified: Secondary | ICD-10-CM

## 2013-12-22 DIAGNOSIS — Z79899 Other long term (current) drug therapy: Secondary | ICD-10-CM

## 2013-12-23 LAB — LIPID PANEL
Cholesterol: 152 mg/dL (ref 0–200)
HDL: 47 mg/dL (ref >39)
LDL Cholesterol: 65 mg/dL (ref 0–99)
Total CHOL/HDL Ratio: 3.2 Ratio
Triglycerides: 202 mg/dL — ABNORMAL HIGH (ref <150)
VLDL: 40 mg/dL (ref 0–40)

## 2013-12-23 LAB — CBC WITH DIFFERENTIAL/PLATELET
Basophils Absolute: 0.1 10*3/uL (ref 0.0–0.1)
Basophils Relative: 0 % (ref 0–1)
Eosinophils Absolute: 0.3 10*3/uL (ref 0.0–0.7)
Eosinophils Relative: 3 % (ref 0–5)
HCT: 41.9 % (ref 36.0–46.0)
Hemoglobin: 14.4 g/dL (ref 12.0–15.0)
Lymphocytes Relative: 23 % (ref 12–46)
Lymphs Abs: 2.6 10*3/uL (ref 0.7–4.0)
MCH: 30.3 pg (ref 26.0–34.0)
MCHC: 34.4 g/dL (ref 30.0–36.0)
MCV: 88 fL (ref 78.0–100.0)
Monocytes Absolute: 0.9 10*3/uL (ref 0.1–1.0)
Monocytes Relative: 8 % (ref 3–12)
Neutro Abs: 7.3 10*3/uL (ref 1.7–7.7)
Neutrophils Relative %: 66 % (ref 43–77)
Platelets: 451 10*3/uL — ABNORMAL HIGH (ref 150–400)
RBC: 4.76 MIL/uL (ref 3.87–5.11)
RDW: 14.5 % (ref 11.5–15.5)
WBC: 11.2 10*3/uL — ABNORMAL HIGH (ref 4.0–10.5)

## 2013-12-23 LAB — COMPREHENSIVE METABOLIC PANEL
ALT: 23 U/L (ref 0–35)
AST: 19 U/L (ref 0–37)
Albumin: 4.1 g/dL (ref 3.5–5.2)
Alkaline Phosphatase: 107 U/L (ref 39–117)
BUN: 15 mg/dL (ref 6–23)
CO2: 31 mEq/L (ref 19–32)
Calcium: 9.5 mg/dL (ref 8.4–10.5)
Chloride: 100 mEq/L (ref 96–112)
Creat: 1.07 mg/dL (ref 0.50–1.10)
Glucose, Bld: 116 mg/dL — ABNORMAL HIGH (ref 70–99)
Potassium: 4.1 mEq/L (ref 3.5–5.3)
Sodium: 140 mEq/L (ref 135–145)
Total Bilirubin: 0.4 mg/dL (ref 0.3–1.2)
Total Protein: 7 g/dL (ref 6.0–8.3)

## 2013-12-23 LAB — HEMOGLOBIN A1C
Hgb A1c MFr Bld: 6.1 % — ABNORMAL HIGH (ref <5.7)
Mean Plasma Glucose: 128 mg/dL — ABNORMAL HIGH (ref <117)

## 2013-12-23 LAB — TSH: TSH: 3.197 u[IU]/mL (ref 0.350–4.500)

## 2013-12-24 ENCOUNTER — Encounter: Payer: Self-pay | Admitting: Family Medicine

## 2013-12-24 ENCOUNTER — Ambulatory Visit (INDEPENDENT_AMBULATORY_CARE_PROVIDER_SITE_OTHER): Payer: 59 | Admitting: Family Medicine

## 2013-12-24 VITALS — BP 104/62 | HR 64 | Ht 62.25 in | Wt 211.0 lb

## 2013-12-24 DIAGNOSIS — IMO0001 Reserved for inherently not codable concepts without codable children: Secondary | ICD-10-CM

## 2013-12-24 DIAGNOSIS — G43909 Migraine, unspecified, not intractable, without status migrainosus: Secondary | ICD-10-CM

## 2013-12-24 DIAGNOSIS — E785 Hyperlipidemia, unspecified: Secondary | ICD-10-CM

## 2013-12-24 DIAGNOSIS — R609 Edema, unspecified: Secondary | ICD-10-CM

## 2013-12-24 DIAGNOSIS — K3184 Gastroparesis: Secondary | ICD-10-CM

## 2013-12-24 DIAGNOSIS — R7301 Impaired fasting glucose: Secondary | ICD-10-CM

## 2013-12-24 DIAGNOSIS — Z79899 Other long term (current) drug therapy: Secondary | ICD-10-CM

## 2013-12-24 DIAGNOSIS — D509 Iron deficiency anemia, unspecified: Secondary | ICD-10-CM

## 2013-12-24 DIAGNOSIS — G4733 Obstructive sleep apnea (adult) (pediatric): Secondary | ICD-10-CM

## 2013-12-24 NOTE — Progress Notes (Signed)
Chief Complaint  Patient presents with  . Hypertension    nonfasting med check, labs already done.   IFG--sugar and A1c were slightly higher than last check.  She tends to eat a lot of carbs with her diet for gastroparesis.  She tolerates rice and potatoes well.  She isn't supposed to have a lot of fiber.  She drinks Gatorade, water, caffeine-free regular [facility].  Denies any regular activity.  A few weeks ago she had increased headaches for a couple of weeks, associated with some dizziness/vertigo, associated also with a very dry nose.  Denied any sinus pain.  She called for refill of imitrex, had 2 tablets left, and didn't end up needing to take them or the new refill.  Headaches have been better since then.  She woke up with a headache this morning, which was relieved by tylenol.  Vertigo is mostly resolved  H/o anemia in the past.  She has been taking iron twice daily for quite some time.  Rx expired, and so she has been out for over a month. Presents to discuss refill, if needed.  Denies any bleeding from anywhere, other than slight occasional bleeding from nose, rare hemorrhoidal bleed (mild).  Hyperlipidemia follow-up:  Patient's diet is very limited by what she can tolerate due to her gastroparesis.  Doesn't eat dairy, eggs, and limited red meat. She does have some cheese daily, as snacks.  Compliant with medications and denies medication side effects  OSA--on CPAP,tolerating well.  Patient does not have h/o HTN.  She states she is on diuretics for fluid retention, and beta blockers for tremor.  She denies any significant swelling.  Past Medical History  Diagnosis Date  . Fibromyalgia   . GERD (gastroesophageal reflux disease)   . Obstructive sleep apnea     On Cpap 2009  . Anxiety   . Depression   . Restless leg syndrome   . Edema   . Migraine   . Hyperlipidemia   . Anemia     previously followed by Dr. Jamse Arn for anemia and elevated platelets  . Paresthesia     Dr.  Everette Rank at [facility]  . Tremor     Dr. Everette Rank  . Gastroparesis     followed at [facility]  . Pneumonia     2012  . Plantar fasciitis 02/2011    R foot  . S/P endometrial ablation 08/09/2006    Novasure Ablation  . C. difficile colitis 10/01/12    treated by [facility]  . Dyssynergia     dyssynergenic defecation, contributing to fecal incontinence.  . Kidney stone   . Pelvic floor dysfunction     pelvic floor dyssynergy   Past Surgical History  Procedure Laterality Date  . Tonsillectomy  1968  . Knee surgery  1999    R knee, Dr. Eddie Dibbles, torn cartilage  . Cholecystectomy  9/05  . Endometrial ablation  08/09/2006    Dr. Valentina Shaggy Ablation   History   Social History  . Marital Status: Married    Spouse Name: N/A    Number of Children: 2  . Years of Education: N/A   Occupational History  . customer service (on disability) [employer]   Social History Main Topics  . Smoking status: Never Smoker   . Smokeless tobacco: Never Used  . Alcohol Use: No  . Drug Use: No  . Sexual Activity: Yes    Birth Control/ Protection: Post-menopausal   Other Topics Concern  . Not on  file   Social History Narrative  . No narrative on file   Outpatient Encounter Prescriptions as of 12/24/2013  Medication Sig  . ALPRAZolam (XANAX) 0.25 MG tablet Take 0.25 mg by mouth 2 (two) times daily as needed. For anxiety  . ARIPiprazole (ABILIFY) 2 MG tablet Take 4 mg by mouth every morning. 5 mg.  . Armodafinil (NUVIGIL) 250 MG tablet Take 250 mg by mouth every morning.   Marland Kitchen atorvastatin (LIPITOR) 40 MG tablet Take 1 tablet (40 mg total) by mouth daily.  . cetirizine (ZYRTEC) 10 MG tablet Take 10 mg by mouth at bedtime.  . clonazePAM (KLONOPIN) 0.5 MG tablet TAKE 1/2 TO 1 TABLET ONCE A DAY AS NEEDED  . dronabinol (MARINOL) 5 MG capsule Take 10 mg by mouth 2 (two) times daily before a meal.   . DULoxetine (CYMBALTA) 60 MG capsule Take 60 mg by mouth 2 (two) times daily.  . ferrous sulfate  325 (65 FE) MG EC tablet Take 325 mg by mouth 2 (two) times daily.   . fluticasone (FLONASE) 50 MCG/ACT nasal spray Place 1 spray into the nose daily.  Marland Kitchen ibuprofen (ADVIL,MOTRIN) 600 MG tablet Take 1 tablet (600 mg total) by mouth every 12 (twelve) hours.  . methocarbamol (ROBAXIN) 500 MG tablet Take 500 mg by mouth 2 (two) times daily as needed (muscle spasms). For pain  . nortriptyline (PAMELOR) 10 MG capsule Take 40 mg by mouth at bedtime.   . ondansetron (ZOFRAN) 4 MG tablet Take 4 mg by mouth every 8 (eight) hours as needed (nausea).   . pantoprazole (PROTONIX) 40 MG tablet Take 40 mg by mouth 2 (two) times daily.  . pregabalin (LYRICA) 50 MG capsule Take 50 mg by mouth 2 (two) times daily.  . Probiotic Product (Vincent) CAPS Take 1 capsule by mouth daily.  . propranolol (INDERAL) 20 MG tablet Take 20 mg by mouth 3 (three) times daily.  . SUMAtriptan (IMITREX) 50 MG tablet TAKE 1 TABLET (50 MG TOTAL) BY MOUTH EVERY 2 (TWO) HOURS AS NEEDED FOR MIGRAINE. MAX 200 MG/DAY  . topiramate (TOPAMAX) 50 MG tablet Take 50 mg by mouth at bedtime.   . traMADol (ULTRAM) 50 MG tablet Take 1 tablet by mouth.  . triamterene-hydrochlorothiazide (DYAZIDE) 37.5-25 MG per capsule Take 1 each (1 capsule total) by mouth daily.  . Wheat Dextrin (BENEFIBER PO) Take 12 g by mouth.  . hydrOXYzine (ATARAX/VISTARIL) 25 MG tablet Take 12.5-50 mg by mouth every 6 (six) hours as needed (itching).  . [DISCONTINUED] amoxicillin (AMOXIL) 500 MG capsule Take 2 capsules (1,000 mg total) by mouth 2 (two) times daily.  . [DISCONTINUED] estradiol (VIVELLE-DOT) 0.05 MG/24HR patch Place 1 patch (0.05 mg total) onto the skin 2 (two) times a week.  . [DISCONTINUED] progesterone (PROMETRIUM) 100 MG capsule Take 1 capsule (100 mg total) by mouth at bedtime.   Allergies  Allergen Reactions  . Erythromycin Nausea Only  . Tramadol Itching  . Dilaudid [Hydromorphone Hcl] Itching   ROS:  No fevers.  Some DOE, and frequent  fatigue.  Denies URI symptoms, current headache; vertigo resolving.  Denies chest pain, palpitations.  +Chronic urinary leakage, uses pad. No odor, urgency, frequency.  Mild heartburn.  +intermittent vomiting from gastroparesis. No edema.  Moods are stable.  Tremor is improved/stable, neuropathy in feet is improved.  PHYSICAL EXAM: BP 104/62  Pulse 64  Ht 5' 2.25" (1.581 m)  Wt 211 lb (95.709 kg)  BMI 38.29 kg/m2  LMP 07/27/2006 Well appearing,  pleasant female in no distress HEENT:  PERRL, EOMI, conjunctiva clear.  TM's and EAC's normal.  OP is clear without lesions or erythema. Nose with crusting, slight scab.  Sinuses nontender Neck: no lymphadenopathy, thyromegaly or mass, no carotid bruit Heart: regular rate and rhythm without murmur Lungs: clear bilaterally Back: no CVA tenderness Abdomen: soft, nontender, no organomegaly or mass Extremities: No edema Psych: normal mood, affect, hygiene and grooming Neuro: alert and oriented.  Normal gait, strength.  Cranial nerves grossly nonfocal    Chemistry      Component Value Date/Time   NA 140 12/22/2013 0930   K 4.1 12/22/2013 0930   CL 100 12/22/2013 0930   CO2 31 12/22/2013 0930   BUN 15 12/22/2013 0930   CREATININE 1.07 12/22/2013 0930   CREATININE 1.23* 02/19/2013 2111      Component Value Date/Time   CALCIUM 9.5 12/22/2013 0930   ALKPHOS 107 12/22/2013 0930   AST 19 12/22/2013 0930   ALT 23 12/22/2013 0930   BILITOT 0.4 12/22/2013 0930     Glucose 116  Lab Results  Component Value Date   HGBA1C 6.1* 12/22/2013   Lab Results  Component Value Date   WBC 11.2* 12/22/2013   HGB 14.4 12/22/2013   HCT 41.9 12/22/2013   MCV 88.0 12/22/2013   PLT 451* 12/22/2013   Lab Results  Component Value Date   TSH 3.197 12/22/2013   Lab Results  Component Value Date   CHOL 152 12/22/2013   HDL 47 12/22/2013   LDLCALC 65 12/22/2013   TRIG 202* 12/22/2013   CHOLHDL 3.2 12/22/2013   ASSESSMENT/PLAN:  Impaired fasting glucose - counseled  extensively re: diet, exercise, weight loss - Plan: Hemoglobin A1c, Glucose, random  OSA (obstructive sleep apnea) - doing well on CPAP  Hyperlipidemia - controlled. TG slightly elevated--diet reviewed  Anemia, iron deficiency - resolved.  no longer needs iron.  stay off it, and repeat CBC in a few months to ensure Hg remains stable. no current bleeding issues - Plan: CBC with Differential, Ferritin, Iron  MIGRAINE HEADACHE - stable, infrequently needing imitrex.  reviewed possible interaction with meds, use sparingly  Gastroparesis - under care of Dr. Derrill Kay.  Stable  FIBROMYALGIA - stable overall.  under care of Dr. Estanislado Pandy  Edema - well controlled with diuretic.  important to have adequate fluid intake to avoid dehydration; low sodium diet  Polypharmacy   Cut down on Gatorade (other than when she is sick, vomiting). Add daily exercise to your routine, even if just 5-10 minutes at a time; goal is a minimum of 30 minutes daily, but do what you can.  3-4 months--A1c, fasting glucose CBC, ferritin (after being off iron for a longer time period)

## 2013-12-24 NOTE — Patient Instructions (Signed)
Continue your current medications.  You do NOT need iron at this time.  Cut down on Gatorade (other than when sick, vomiting). Cut out regular sodas. Add daily exercise to your routine, even if just 5-10 minutes at a time; goal is a minimum of 30 minutes daily, but do what you can.

## 2014-01-11 ENCOUNTER — Other Ambulatory Visit: Payer: Self-pay

## 2014-01-11 DIAGNOSIS — Z1231 Encounter for screening mammogram for malignant neoplasm of breast: Secondary | ICD-10-CM

## 2014-01-29 ENCOUNTER — Ambulatory Visit: Payer: 59

## 2014-03-03 ENCOUNTER — Encounter: Payer: 59 | Admitting: Gynecology

## 2014-03-03 ENCOUNTER — Ambulatory Visit: Payer: 59

## 2014-03-08 ENCOUNTER — Ambulatory Visit
Admission: RE | Admit: 2014-03-08 | Discharge: 2014-03-08 | Disposition: A | Discharge status: Home / Self Care | Payer: 59 | Source: Ambulatory Visit

## 2014-03-08 DIAGNOSIS — Z1231 Encounter for screening mammogram for malignant neoplasm of breast: Secondary | ICD-10-CM

## 2014-03-17 ENCOUNTER — Encounter: Payer: Self-pay | Admitting: Family Medicine

## 2014-03-17 ENCOUNTER — Ambulatory Visit
Admission: RE | Admit: 2014-03-17 | Discharge: 2014-03-17 | Disposition: A | Discharge status: Home / Self Care | Payer: 59 | Source: Ambulatory Visit | Attending: Family Medicine | Admitting: Family Medicine

## 2014-03-17 ENCOUNTER — Ambulatory Visit (INDEPENDENT_AMBULATORY_CARE_PROVIDER_SITE_OTHER): Payer: 59 | Admitting: Family Medicine

## 2014-03-17 VITALS — BP 102/60 | HR 64 | Temp 98.2°F | Ht 62.25 in | Wt 218.0 lb

## 2014-03-17 DIAGNOSIS — R1032 Left lower quadrant pain: Secondary | ICD-10-CM

## 2014-03-17 DIAGNOSIS — R82998 Other abnormal findings in urine: Secondary | ICD-10-CM

## 2014-03-17 DIAGNOSIS — K5900 Constipation, unspecified: Secondary | ICD-10-CM

## 2014-03-17 DIAGNOSIS — R8290 Unspecified abnormal findings in urine: Secondary | ICD-10-CM

## 2014-03-17 DIAGNOSIS — R109 Unspecified abdominal pain: Secondary | ICD-10-CM

## 2014-03-17 LAB — POCT URINALYSIS DIPSTICK
Bilirubin, UA: NEGATIVE
Blood, UA: NEGATIVE
Glucose, UA: NEGATIVE
Ketones, UA: NEGATIVE
Nitrite, UA: NEGATIVE
Spec Grav, UA: 1.015
Urobilinogen, UA: NEGATIVE
pH, UA: 6

## 2014-03-17 LAB — CBC WITH DIFFERENTIAL/PLATELET
Basophils Absolute: 0 10*3/uL (ref 0.0–0.1)
Eosinophils Absolute: 0 10*3/uL (ref 0.0–0.7)
HCT: 44.9 % (ref 36.0–46.0)
Hemoglobin: 14.4 g/dL (ref 12.0–15.0)
Lymphocytes Relative: 15 % (ref 12–46)
Lymphs Abs: 2.4 10*3/uL (ref 0.7–4.0)
MCH: 29.2 pg (ref 26.0–34.0)
MCHC: 32.1 g/dL (ref 30.0–36.0)
MCV: 91.2 fL (ref 78.0–100.0)
Monocytes Absolute: 0.8 10*3/uL (ref 0.1–1.0)
Monocytes Relative: 5 % (ref 3–12)
Neutro Abs: 13 10*3/uL — ABNORMAL HIGH (ref 1.7–7.7)
Neutrophils Relative %: 80 % — ABNORMAL HIGH (ref 43–77)
Platelets: 481 10*3/uL — ABNORMAL HIGH (ref 150–400)
RBC: 4.93 MIL/uL (ref 3.87–5.11)
RDW: 14.6 % (ref 11.5–15.5)
WBC: 16.2 10*3/uL — ABNORMAL HIGH (ref 4.0–10.5)

## 2014-03-17 NOTE — Progress Notes (Signed)
Chief Complaint  Patient presents with  . Abdominal Pain    started Monday and thought she was constipated-did have a small BM's yesterday and today. Tuesday evening pain worsened. Pain started this morning in the lower left side of her abdomen. Is nauseated as well, as she is always is but is definetly worse this week. Has not used her vivelle patch in 6 months-did start back yesterday with one patch.    Normal stool pattern is going 2x/day, but often alternating between being constipated and having loose stools.  She went 2 days without any bowel movement ("Sunday and Monday).  She then took stool softeners, which usually helps without causing too much diarrhea.  Sunday and Monday she was straining, but unable to have a BM.  Yesterday she had stool, but didn't feel like she emptied well.  She had straining.  She has noticed some blood on the toilet paper (from hemorrhoids), none in the stool.  Pain seemed to get worse after eating yesterday (last night)--grilled chicken and baked potato.  Last night the pain was across her upper stomach.  Currently her pain is in the LLQ, no longer in the epigastrium.  Denied any heartburn, had some belching.  Last vomited once last night.  Has had some heaving (which is usual for her).  Usually nausea will resolve, but it has been staying, even with use of suppository yesterday.  She has had colonoscopy within the last 1-2 years (Dr. Koch).  Only abdominal surgery she had was lap chole.  She denies fevers chills.  She denies urinary urgency, dysuria, blood in urine or vaginal discharge  Past Medical History  Diagnosis Date  . Fibromyalgia   . GERD (gastroesophageal reflux disease)   . Obstructive sleep apnea     On Cpap 2009  . Anxiety   . Depression   . Restless leg syndrome   . Edema   . Migraine   . Hyperlipidemia   . Anemia     previously followed by Dr. Odogwu for anemia and elevated platelets  . Paresthesia     Dr. Tonuzi at [facility]  .  Tremor     Dr. Tonuzi  . Gastroparesis     followed at [facility]  . Pneumonia     20" 12  . Plantar fasciitis 02/2011    R foot  . S/P endometrial ablation 08/09/2006    Novasure Ablation  . C. difficile colitis 10/01/12    treated by [facility]  . Dyssynergia     dyssynergenic defecation, contributing to fecal incontinence.  . Kidney stone   . Pelvic floor dysfunction     pelvic floor dyssynergy   Past Surgical History  Procedure Laterality Date  . Tonsillectomy  1968  . Knee surgery  1999    R knee, Dr. Eddie Dibbles, torn cartilage  . Cholecystectomy  9/05  . Endometrial ablation  08/09/2006    Dr. Valentina Shaggy Ablation   History   Social History  . Marital Status: Married    Spouse Name: N/A    Number of Children: 2  . Years of Education: N/A   Occupational History  . customer service (on disability) [employer]   Social History Main Topics  . Smoking status: Never Smoker   . Smokeless tobacco: Never Used  . Alcohol Use: No  . Drug Use: No  . Sexual Activity: Yes    Birth Control/ Protection: Post-menopausal   Other Topics Concern  . Not on file   Social History  Narrative  . No narrative on file   Outpatient Encounter Prescriptions as of 03/17/2014  Medication Sig Note  . ALPRAZolam (XANAX) 0.25 MG tablet Take 0.25 mg by mouth 2 (two) times daily as needed. For anxiety   . ARIPiprazole (ABILIFY) 2 MG tablet Take 4 mg by mouth every morning. 5 mg.   . Armodafinil (NUVIGIL) 250 MG tablet Take 250 mg by mouth every morning.    Marland Kitchen atorvastatin (LIPITOR) 40 MG tablet Take 1 tablet (40 mg total) by mouth daily.   . cetirizine (ZYRTEC) 10 MG tablet Take 10 mg by mouth at bedtime.   . clonazePAM (KLONOPIN) 0.5 MG tablet TAKE 1/2 TO 1 TABLET ONCE A DAY AS NEEDED   . docusate sodium (COLACE) 100 MG capsule Take 200 mg by mouth as needed for mild constipation.   Marland Kitchen dronabinol (MARINOL) 5 MG capsule Take 10 mg by mouth 2 (two) times daily before a meal.    . DULoxetine  (CYMBALTA) 60 MG capsule Take 60 mg by mouth 2 (two) times daily.   Marland Kitchen estradiol (VIVELLE-DOT) 0.05 MG/24HR patch Place 1 patch onto the skin 2 (two) times a week.   . fluticasone (FLONASE) 50 MCG/ACT nasal spray Place 1 spray into the nose daily.   . naproxen sodium (ANAPROX) 220 MG tablet Take 220 mg by mouth 2 (two) times daily with a meal.   . nortriptyline (PAMELOR) 10 MG capsule Take 40 mg by mouth at bedtime.  12/24/2013: For neuropathy in her feet  . ondansetron (ZOFRAN) 4 MG tablet Take 4 mg by mouth every 8 (eight) hours as needed (nausea).    . pantoprazole (PROTONIX) 40 MG tablet Take 40 mg by mouth 2 (two) times daily.   . pregabalin (LYRICA) 50 MG capsule Take 50 mg by mouth 2 (two) times daily. 12/24/2013: For fibromyalgia  . Probiotic Product ([place]) CAPS Take 1 capsule by mouth daily.   . promethazine (PHENERGAN) 25 MG suppository Place 25 mg rectally every 6 (six) hours as needed for nausea or vomiting.   . propranolol (INDERAL) 20 MG tablet Take 40 mg by mouth at bedtime.  12/24/2013: Takes 2 at bedtime for tremor, from neurologist  . topiramate (TOPAMAX) 50 MG tablet Take 50 mg by mouth at bedtime.  12/24/2013: For migraine prevention  . triamterene-hydrochlorothiazide (DYAZIDE) 37.5-25 MG per capsule Take 1 each (1 capsule total) by mouth daily.   . ferrous sulfate 325 (65 FE) MG EC tablet Take 325 mg by mouth 2 (two) times daily.  12/24/2013: Stopped taking over a month ago, when she ran out  . hydrOXYzine (ATARAX/VISTARIL) 25 MG tablet Take 12.5-50 mg by mouth every 6 (six) hours as needed (itching).   Marland Kitchen ibuprofen (ADVIL,MOTRIN) 600 MG tablet Take 1 tablet (600 mg total) by mouth every 12 (twelve) hours.   . methocarbamol (ROBAXIN) 500 MG tablet Take 500 mg by mouth 2 (two) times daily as needed (muscle spasms). For pain   . SUMAtriptan (IMITREX) 50 MG tablet TAKE 1 TABLET (50 MG TOTAL) BY MOUTH EVERY 2 (TWO) HOURS AS NEEDED FOR MIGRAINE. MAX 200 MG/DAY   . traMADol  (ULTRAM) 50 MG tablet Take 1 tablet by mouth. 06/26/2013: rx'd by Dr. Wynelle Link.  Uses sparingly for knee pain  . Wheat Dextrin (BENEFIBER PO) Take 12 g by mouth.    Allergies  Allergen Reactions  . Erythromycin Nausea Only  . Tramadol Itching  . Dilaudid [Hydromorphone Hcl] Itching   ROS:  Denies fevers, chills.  +nausea,  vomiting as per HPI.  +constipation with BRB on toilet paper, per HPI.  +LLQ pain.  Denies URI symptoms, rashes, bruising, or other complaints except as per HPI.  PHYSICAL EXAM:  BP 102/60  Pulse 64  Temp(Src) 98.2 F (36.8 C) (Oral)  Ht 5' 2.25" (1.581 m)  Wt 218 lb (98.884 kg)  BMI 39.56 kg/m2  LMP 07/27/2006 Obese, pleasant female who doesn't appear to be in any acute pain/distres; did have some dry heaves after exam HEENT:  PERRL, EOMI, conjunctiva clear. OP with moist mucus  Membranes Neck: no lymphadenopathy or mass Heart: regular rate and rhythm Lungs: clear bilaterally Back: no CVA tenderness Abdomen: Mildly tender in epigastrium.  Tender along entire left side of abdomen.  Hypoactive bowel sounds heard in upper abdomen, not high pitched.  Decreased bowel sounds LLQ.  No mass, rebound tenderness or guarding Skin: no rashes Neuro: alert and oriented. Normal gait  Urine dip: 2+ leuks; trace protein  ASSESSMENT/PLAN:  LLQ abdominal pain - Plan: CBC with Differential, DG Abd Acute W/Chest  Abdominal pain, unspecified site - Plan: POCT Urinalysis Dipstick, Urine culture  Abnormal urinalysis - Plan: Urine culture  Lab Results  Component Value Date   WBC 16.2* 03/17/2014   HGB 14.4 03/17/2014   HCT 44.9 03/17/2014   MCV 91.2 03/17/2014   PLT 481* 03/17/2014   Acute abdominal series:  There is mild basilar linear scarring or atelectasis present. No  focal infiltrate or effusion is seen. Mediastinal contours are  unchanged and mild cardiomegaly is stable.  Supine and erect views of the abdomen show no bowel obstruction.  There is a moderate amount of  feces throughout the colon which may  indicate constipation. No free air is seen on the erect view.  Surgical clips are present in the right upper quadrant from prior  cholecystectomy.   ASSESSMENT/PLAN:  LLQ abdominal pain - Plan: CBC with Differential, DG Abd Acute W/Chest  Abdominal pain, unspecified site - Plan: POCT Urinalysis Dipstick, Urine culture  Abnormal urinalysis - Plan: Urine culture  Unspecified constipation  Use miralax once daily to help with constipation.  Colonoscopy records were received and reviewed--no diverticulosis noted. F/u (or to ER) if worsening pain, fevers, vomiting/dehydration.  Otherwise f/u next week if any ongoing problems

## 2014-03-17 NOTE — Patient Instructions (Signed)
Go to [facility] today for x-rays.  We will have the results later today.  We will also have your blood tests results later this afternoon., Drink/eat clears only for now.  If we tell you that there is no evidence of obstruction based on your x-ray results, then try Miralax to help with the constipation, which might be the cause of the pain.

## 2014-03-18 ENCOUNTER — Telehealth: Payer: Self-pay | Admitting: *Deleted

## 2014-03-18 NOTE — Telephone Encounter (Signed)
Patient called to see if her urine culture was done, I did check and let her know it was not yet completed. She told me on the phone that she has had many bowel movements over the last 24 hours and that she has began to have diarrhea, wants to know how long she should continue with the miralax? She feel better than yesterday. Left side of stomach still tender. Still nauseated and fatigued-just doesn't feel good. Please advise.

## 2014-03-18 NOTE — Telephone Encounter (Signed)
Patient advised.

## 2014-03-18 NOTE — Telephone Encounter (Signed)
Hold the miralax if having frequent loose stools.  Can take another 1/2 dose if diarrhea stops, but she still feels constipated/straining/has more stool to empty (she may not need to restart if pain resolves also).  Ensure that she is taking in plenty of fluids (clears ideal, as we discussed, but can advance as tolerated.  NO dairy)

## 2014-03-21 LAB — URINE CULTURE: Colony Count: 75000

## 2014-03-22 ENCOUNTER — Other Ambulatory Visit: Payer: Self-pay | Admitting: *Deleted

## 2014-03-22 MED ORDER — SULFAMETHOXAZOLE-TMP DS 800-160 MG PO TABS: 1.0000 | ORAL_TABLET | Freq: Two times a day (BID) | ORAL | Status: DC

## 2014-03-24 ENCOUNTER — Other Ambulatory Visit: Payer: 59

## 2014-03-24 DIAGNOSIS — R7301 Impaired fasting glucose: Secondary | ICD-10-CM

## 2014-03-24 DIAGNOSIS — D509 Iron deficiency anemia, unspecified: Secondary | ICD-10-CM

## 2014-03-24 LAB — CBC WITH DIFFERENTIAL/PLATELET
Basophils Absolute: 0.1 10*3/uL (ref 0.0–0.1)
Basophils Relative: 1 % (ref 0–1)
Eosinophils Absolute: 0.2 10*3/uL (ref 0.0–0.7)
Eosinophils Relative: 2 % (ref 0–5)
HCT: 38.2 % (ref 36.0–46.0)
Hemoglobin: 12.7 g/dL (ref 12.0–15.0)
Lymphocytes Relative: 21 % (ref 12–46)
Lymphs Abs: 2 10*3/uL (ref 0.7–4.0)
MCH: 29.5 pg (ref 26.0–34.0)
MCHC: 33.2 g/dL (ref 30.0–36.0)
MCV: 88.8 fL (ref 78.0–100.0)
Monocytes Absolute: 0.8 10*3/uL (ref 0.1–1.0)
Monocytes Relative: 8 % (ref 3–12)
Neutro Abs: 6.4 10*3/uL (ref 1.7–7.7)
Neutrophils Relative %: 68 % (ref 43–77)
Platelets: 397 10*3/uL (ref 150–400)
RBC: 4.3 MIL/uL (ref 3.87–5.11)
RDW: 14.3 % (ref 11.5–15.5)
WBC: 9.4 10*3/uL (ref 4.0–10.5)

## 2014-03-24 LAB — HEMOGLOBIN A1C
Hgb A1c MFr Bld: 6.3 % — ABNORMAL HIGH (ref <5.7)
Mean Plasma Glucose: 134 mg/dL — ABNORMAL HIGH (ref <117)

## 2014-03-24 LAB — FERRITIN: Ferritin: 49 ng/mL (ref 10–291)

## 2014-03-24 LAB — IRON: Iron: 98 ug/dL (ref 42–145)

## 2014-03-24 LAB — GLUCOSE, RANDOM: Glucose, Bld: 99 mg/dL (ref 70–99)

## 2014-03-29 ENCOUNTER — Ambulatory Visit (INDEPENDENT_AMBULATORY_CARE_PROVIDER_SITE_OTHER): Payer: 59 | Admitting: Family Medicine

## 2014-03-29 ENCOUNTER — Encounter: Payer: Self-pay | Admitting: Family Medicine

## 2014-03-29 VITALS — BP 112/72 | HR 60 | Ht 62.0 in | Wt 213.0 lb

## 2014-03-29 DIAGNOSIS — E785 Hyperlipidemia, unspecified: Secondary | ICD-10-CM

## 2014-03-29 DIAGNOSIS — Z79899 Other long term (current) drug therapy: Secondary | ICD-10-CM

## 2014-03-29 DIAGNOSIS — J309 Allergic rhinitis, unspecified: Secondary | ICD-10-CM

## 2014-03-29 DIAGNOSIS — R7301 Impaired fasting glucose: Secondary | ICD-10-CM

## 2014-03-29 DIAGNOSIS — R609 Edema, unspecified: Secondary | ICD-10-CM

## 2014-03-29 MED ORDER — FLUTICASONE PROPIONATE 50 MCG/ACT NA SUSP: 2.0000 | Freq: Every day | NASAL | Status: DC

## 2014-03-29 NOTE — Progress Notes (Signed)
Chief Complaint  Patient presents with  . Diabetes    nonfasting med check.    Patient was seen recently with LLQ pain.  Ultimately symptoms resolved. She used miralax and stool softeners to help with constipation.  Nausea/vomiting is back to baseline. Her WBC had been elevated, but repeat testing a week later was normal. She is currently on Bactrim for UTI, as recent urine culture showed  75 K e.coli.  She denies urinary symptoms.  IFG--sugar and A1c were slightly higher than last check. She tends to eat a lot of carbs with her diet for gastroparesis. She does not get regular exercise.  H/o anemia in the past.  She had CBC done after stopping iron, to ensure that it was no longer needed.  She denies any bleeding from anywhere, other than slight occasional bleeding from nose, rare hemorrhoidal bleed (mild).   OSA--on CPAP,tolerating well.   Patient does not have h/o HTN. She states she is on diuretics for fluid retention, and beta blockers for tremor. She denies any significant swelling.  She has missed doses (ie doesn't take if going to be traveling) and hasn't had any swelling noted.  Headaches and vertigo resolved  She complains of sweating, easily getting clammy.  She reports an episode that occurred last week--increased tremor, whole body felt like she was shaking--no fevers.  Lasted a few hours.  Gradually improved.  Trouble with her words.  Has happened a total of two times.  Allergies: she was outside over the weekend, and yesterday her throat was very scratchy, clearing her throat, slight cough.   Needs refill Flonase  Past Medical History  Diagnosis Date  . Fibromyalgia   . GERD (gastroesophageal reflux disease)   . Obstructive sleep apnea     On Cpap 2009  . Anxiety   . Depression   . Restless leg syndrome   . Edema   . Migraine   . Hyperlipidemia   . Anemia     previously followed by Dr. Jamse Arn for anemia and elevated platelets  . Paresthesia     Dr. Everette Rank at  [facility]  . Tremor     Dr. Everette Rank  . Gastroparesis     followed at [facility]  . Pneumonia     2012  . Plantar fasciitis 02/2011    R foot  . S/P endometrial ablation 08/09/2006    Novasure Ablation  . C. difficile colitis 10/01/12    treated by [facility]  . Dyssynergia     dyssynergenic defecation, contributing to fecal incontinence.  . Kidney stone   . Pelvic floor dysfunction     pelvic floor dyssynergy   Past Surgical History  Procedure Laterality Date  . Tonsillectomy  1968  . Knee surgery  1999    R knee, Dr. Eddie Dibbles, torn cartilage  . Cholecystectomy  9/05  . Endometrial ablation  08/09/2006    Dr. Valentina Shaggy Ablation   History   Social History  . Marital Status: Married    Spouse Name: N/A    Number of Children: 2  . Years of Education: N/A   Occupational History  . customer service (on disability) [employer]   Social History Main Topics  . Smoking status: Never Smoker   . Smokeless tobacco: Never Used  . Alcohol Use: No  . Drug Use: No  . Sexual Activity: Yes    Birth Control/ Protection: Post-menopausal   Other Topics Concern  . Not on file   Social History Narrative  .  No narrative on file   Outpatient Encounter Prescriptions as of 03/29/2014  Medication Sig Note  . ALPRAZolam (XANAX) 0.25 MG tablet Take 0.25 mg by mouth 2 (two) times daily as needed. For anxiety   . ARIPiprazole (ABILIFY) 2 MG tablet Take 4 mg by mouth every morning. 5 mg.   . Armodafinil (NUVIGIL) 250 MG tablet Take 250 mg by mouth every morning.    Marland Kitchen atorvastatin (LIPITOR) 40 MG tablet Take 1 tablet (40 mg total) by mouth daily.   . cetirizine (ZYRTEC) 10 MG tablet Take 10 mg by mouth at bedtime.   . clonazePAM (KLONOPIN) 0.5 MG tablet TAKE 1/2 TO 1 TABLET ONCE A DAY AS NEEDED   . dronabinol (MARINOL) 5 MG capsule Take 10 mg by mouth 2 (two) times daily before a meal.    . DULoxetine (CYMBALTA) 60 MG capsule Take 60 mg by mouth 2 (two) times daily.   Marland Kitchen estradiol  (VIVELLE-DOT) 0.05 MG/24HR patch Place 1 patch onto the skin 2 (two) times a week.   . fluticasone (FLONASE) 50 MCG/ACT nasal spray Place 2 sprays into both nostrils daily.   . methocarbamol (ROBAXIN) 500 MG tablet Take 500 mg by mouth 2 (two) times daily as needed (muscle spasms). For pain   . nortriptyline (PAMELOR) 10 MG capsule Take 40 mg by mouth at bedtime.  12/24/2013: For neuropathy in her feet  . pantoprazole (PROTONIX) 40 MG tablet Take 40 mg by mouth 2 (two) times daily.   . pregabalin (LYRICA) 50 MG capsule Take 50 mg by mouth 2 (two) times daily. 12/24/2013: For fibromyalgia  . Probiotic Product ([place]) CAPS Take 1 capsule by mouth daily.   . propranolol (INDERAL) 20 MG tablet Take 40 mg by mouth at bedtime.  12/24/2013: Takes 2 at bedtime for tremor, from neurologist  . sulfamethoxazole-trimethoprim (BACTRIM DS) 800-160 MG per tablet Take 1 tablet by mouth 2 (two) times daily.   . SUMAtriptan (IMITREX) 50 MG tablet TAKE 1 TABLET (50 MG TOTAL) BY MOUTH EVERY 2 (TWO) HOURS AS NEEDED FOR MIGRAINE. MAX 200 MG/DAY   . topiramate (TOPAMAX) 50 MG tablet Take 50 mg by mouth at bedtime.  12/24/2013: For migraine prevention  . triamterene-hydrochlorothiazide (DYAZIDE) 37.5-25 MG per capsule Take 1 each (1 capsule total) by mouth daily.   . [DISCONTINUED] fluticasone (FLONASE) 50 MCG/ACT nasal spray Place 1 spray into the nose daily.   . [DISCONTINUED] Wheat Dextrin (BENEFIBER PO) Take 12 g by mouth.   . docusate sodium (COLACE) 100 MG capsule Take 200 mg by mouth as needed for mild constipation.   . hydrOXYzine (ATARAX/VISTARIL) 25 MG tablet Take 12.5-50 mg by mouth every 6 (six) hours as needed (itching). 03/29/2014: Takes prn itching, not needing  . ibuprofen (ADVIL,MOTRIN) 600 MG tablet Take 1 tablet (600 mg total) by mouth every 12 (twelve) hours.   . ondansetron (ZOFRAN) 4 MG tablet Take 4 mg by mouth every 8 (eight) hours as needed (nausea).  03/29/2014: Prn nausea; uses  occasionally  . promethazine (PHENERGAN) 25 MG suppository Place 25 mg rectally every 6 (six) hours as needed for nausea or vomiting.   . traMADol (ULTRAM) 50 MG tablet Take 1 tablet by mouth. 06/26/2013: rx'd by Dr. Wynelle Link.  Uses sparingly for knee pain  . [DISCONTINUED] ferrous sulfate 325 (65 FE) MG EC tablet Take 325 mg by mouth 2 (two) times daily.  12/24/2013: Stopped taking over a month ago, when she ran out  . [DISCONTINUED] naproxen sodium (ANAPROX) 220  MG tablet Take 220 mg by mouth 2 (two) times daily with a meal.    Allergies  Allergen Reactions  . Erythromycin Nausea Only  . Tramadol Itching  . Dilaudid [Hydromorphone Hcl] Itching   ROS:  Denies fevers, headaches, dizziness, chest pain, palpitations.  Nausea is at baseline.  Bowels are back to normal. No urinary complaints. No bleeding/bruising/rashes. Moods are stable.  Denies edema, cough, shortness of breath.  See HPI  PHYSICAL EXAM: BP 112/72  Pulse 60  Ht 5\' 2"  (1.575 m)  Wt 213 lb (96.616 kg)  BMI 38.95 kg/m2  LMP 07/27/2006 Pleasant, obese female in no distress Neck: no lymphadenopathy or mass Heart: regular rate and rhythm Lungs: clear bilaterally Back: no spinal or CVA tenderness Abdomen: soft, nontender, no mass.  Normal bowel sounds Extremities: no edema, normal pulses Neuro: alert and oriented.  Normal gait, strength Psych: normal mood, affect, hygiene and grooming  Lab Results  Component Value Date   HGBA1C 6.3* 03/24/2014   Glucose 99  Lab Results  Component Value Date   WBC 9.4 03/24/2014   HGB 12.7 03/24/2014   HCT 38.2 03/24/2014   MCV 88.8 03/24/2014   PLT 397 03/24/2014   Lab Results  Component Value Date   IRON 98 03/24/2014   TIBC 325 05/02/2011   FERRITIN 49 03/24/2014   ASSESSMENT/PLAN  Impaired fasting glucose - encouraged weight loss, daily exercise; diet reviewed especially with respect to portion sizes, cutting back calories for wt loss - Plan: Hemoglobin A1c, Comprehensive metabolic  panel  Allergic rhinitis, cause unspecified - stable; continue current meds - Plan: fluticasone (FLONASE) 50 MCG/ACT nasal spray  Edema - try cutting back on diuretic, ideally to prn use  Hyperlipidemia - Plan: Lipid panel  Encounter for long-term (current) use of other medications - Plan: Lipid panel, Comprehensive metabolic panel  Diuretic--try taking the diuretic just every other day for 1-2 weeks, and a dose on the in between days only if you are having noticeable swelling in your legs/feet.  If you don't have any recurrent swelling while taking it every other day, then try and cut back further, eventually to only taking the diuretic if you develop swelling (just as needed). Periodically check your blood pressure to make sure that without this medication (or less often) that your blood pressure isn't too high.  Goal blood pressure is <135/85.  Abdomina pain/UTI--pain resolved. Complete course of antibiotics.  Impaired fasting glucose.  Reviewed recommended diet, aware of her significant limitations, and in general seems to tolerate carbs best in her diet over other foods.  We discussed the need to cut back on calories to lose weight; exercise daily to help lower sugars(and weight). Cut back on juices and portions.  OSA--continue CPAP; weight loss encouraged Depression/anxiety--we reviewed that some symptoms she has potentially could be side effects from meds (ie cymbalta--sweating, tremor); continue her current meds, as prescribed by other physicians.  Just making her aware of potential side effects so that she can discuss with appropriate doctor if symptoms worsen/increase in frequency/severity   F/u 3 mos with fasting labs prior

## 2014-03-29 NOTE — Patient Instructions (Signed)
try taking the diuretic just every other day for 1-2 weeks, and a dose on the in between days only if you are having noticeable swelling in your legs/feet.  If you don't have any recurrent swelling while taking it every other day, then try and cut back further, eventually to only taking the diuretic if you develop swelling (just as needed). Periodically check your blood pressure to make sure that without this medication (or less often) that your blood pressure isn't too high.  Goal blood pressure is <135/85.  Try and get at least 30 minutes of exercise every day (even if in 5-10 minute intervals). Try cutting back on portions to try and lose weight, which will help keep your sugars down and prevent from developing diabetes (you are pre-diabetic).

## 2014-03-30 ENCOUNTER — Encounter: Payer: Self-pay | Admitting: Family Medicine

## 2014-03-31 ENCOUNTER — Encounter: Payer: Self-pay | Admitting: Family Medicine

## 2014-04-01 ENCOUNTER — Encounter: Payer: Self-pay | Admitting: Internal Medicine

## 2014-04-01 ENCOUNTER — Encounter: Payer: Self-pay | Admitting: Gynecology

## 2014-04-01 ENCOUNTER — Ambulatory Visit (INDEPENDENT_AMBULATORY_CARE_PROVIDER_SITE_OTHER): Payer: 59 | Admitting: Gynecology

## 2014-04-01 VITALS — BP 134/86 | Ht 62.0 in | Wt 214.0 lb

## 2014-04-01 DIAGNOSIS — Z79890 Hormone replacement therapy (postmenopausal): Secondary | ICD-10-CM

## 2014-04-01 DIAGNOSIS — Z01419 Encounter for gynecological examination (general) (routine) without abnormal findings: Secondary | ICD-10-CM

## 2014-04-01 DIAGNOSIS — N952 Postmenopausal atrophic vaginitis: Secondary | ICD-10-CM

## 2014-04-01 DIAGNOSIS — N951 Menopausal and female climacteric states: Secondary | ICD-10-CM

## 2014-04-01 MED ORDER — PROGESTERONE MICRONIZED 100 MG PO CAPS
100.0000 mg | ORAL_CAPSULE | Freq: Every day | ORAL | Status: DC
Start: 2014-04-01 — End: 2015-04-06

## 2014-04-01 MED ORDER — ESTRADIOL 0.075 MG/24HR TD PTTW: 1.0000 | MEDICATED_PATCH | TRANSDERMAL | Status: DC

## 2014-04-01 NOTE — Patient Instructions (Addendum)
Try the higher dose estrogen patch as we discussed. Follow up with me if you have any issues with this. Call if you do any vaginal bleeding.  Hormone Therapy At menopause, your body begins making less estrogen and progesterone hormones. This causes the body to stop having menstrual periods. This is because estrogen and progesterone hormones control your periods and menstrual cycle. A lack of estrogen may cause symptoms such as:  Hot flushes (or hot flashes).  Vaginal dryness.  Dry skin.  Loss of sex drive.  Risk of bone loss (osteoporosis). When this happens, you may choose to take hormone therapy to get back the estrogen lost during menopause. When the hormone estrogen is given alone, it is usually referred to as ET (Estrogen Therapy). When the hormone progestin is combined with estrogen, it is generally called HT (Hormone Therapy). This was formerly known as hormone replacement therapy (HRT). Your caregiver can help you make a decision on what will be best for you. The decision to use HT seems to change often as new studies are done. Many studies do not agree on the benefits of hormone replacement therapy. LIKELY BENEFITS OF HT INCLUDE PROTECTION FROM:  Hot Flushes (also called hot flashes) - A hot flush is a sudden feeling of heat that spreads over the face and body. The skin may redden like a blush. It is connected with sweats and sleep disturbance. Women going through menopause may have hot flushes a few times a month or several times per day depending on the woman.  Osteoporosis (bone loss)- Estrogen helps guard against bone loss. After menopause, a woman's bones slowly lose calcium and become weak and brittle. As a result, bones are more likely to break. The hip, wrist, and spine are affected most often. Hormone therapy can help slow bone loss after menopause. Weight bearing exercise and taking calcium with vitamin D also can help prevent bone loss. There are also medications that your  caregiver can prescribe that can help prevent osteoporosis.  Vaginal Dryness - Loss of estrogen causes changes in the vagina. Its lining may become thin and dry. These changes can cause pain and bleeding during sexual intercourse. Dryness can also lead to infections. This can cause burning and itching. (Vaginal estrogen treatment can help relieve pain, itching, and dryness.)  Urinary Tract Infections are more common after menopause because of lack of estrogen. Some women also develop urinary incontinence because of low estrogen levels in the vagina and bladder.  Possible other benefits of estrogen include a positive effect on mood and short-term memory in women. RISKS AND COMPLICATIONS  Using estrogen alone without progesterone causes the lining of the uterus to grow. This increases the risk of lining of the uterus (endometrial) cancer. Your caregiver should give another hormone called progestin if you have a uterus.  Women who take combined (estrogen and progestin) HT appear to have an increased risk of breast cancer. The risk appears to be small, but increases throughout the time that HT is taken.  Combined therapy also makes the breast tissue slightly denser which makes it harder to read mammograms (breast X-rays).  Combined, estrogen and progesterone therapy can be taken together every day, in which case there may be spotting of blood. HT therapy can be taken cyclically in which case you will have menstrual periods. Cyclically means HT is taken for a set amount of days, then not taken, then this process is repeated.  HT may increase the risk of stroke, heart attack, breast cancer and  forming blood clots in your leg.  Transdermal estrogen (estrogen that is absorbed through the skin with a patch or a cream) may have more positive results with:  Cholesterol.  Blood pressure.  Blood clots. Having the following conditions may indicate you should not have HT:  Endometrial cancer.  Liver  disease.  Breast cancer.  Heart disease.  History of blood clots.  Stroke. TREATMENT   If you choose to take HT and have a uterus, usually estrogen and progestin are prescribed.  Your caregiver will help you decide the best way to take the medications.  Possible ways to take estrogen include:  Pills.  Patches.  Gels.  Sprays.  Vaginal estrogen cream, rings and tablets.  It is best to take the lowest dose possible that will help your symptoms and take them for the shortest period of time that you can.  Hormone therapy can help relieve some of the problems (symptoms) that affect women at menopause. Before making a decision about HT, talk to your caregiver about what is best for you. Be well informed and comfortable with your decisions. HOME CARE INSTRUCTIONS   Follow your caregivers advice when taking the medications.  A Pap test is done to screen for cervical cancer.  The first Pap test should be done at age 77.  Between ages 74 and 29, Pap tests are repeated every 2 years.  Beginning at age 67, you are advised to have a Pap test every 3 years as long as your past 3 Pap tests have been normal.  Some women have medical problems that increase the chance of getting cervical cancer. Talk to your caregiver about these problems. It is especially important to talk to your caregiver if a new problem develops soon after your last Pap test. In these cases, your caregiver may recommend more frequent screening and Pap tests.  The above recommendations are the same for women who have or have not gotten the vaccine for HPV (Human Papillomavirus).  If you had a hysterectomy for a problem that was not a cancer or a condition that could lead to cancer, then you no longer need Pap tests. However, even if you no longer need a Pap test, a regular exam is a good idea to make sure no other problems are starting.   If you are between ages 88 and 49, and you have had normal Pap tests going  back 10 years, you no longer need Pap tests. However, even if you no longer need a Pap test, a regular exam is a good idea to make sure no other problems are starting.   If you have had past treatment for cervical cancer or a condition that could lead to cancer, you need Pap tests and screening for cancer for at least 20 years after your treatment.  If Pap tests have been discontinued, risk factors (such as a new sexual partner) need to be re-assessed to determine if screening should be resumed.  Some women may need screenings more often if they are at high risk for cervical cancer.  Get mammograms done as per the advice of your caregiver. SEEK IMMEDIATE MEDICAL CARE IF:  You develop abnormal vaginal bleeding.  You have pain or swelling in your legs, shortness of breath, or chest pain.  You develop dizziness or headaches.  You have lumps or changes in your breasts or armpits.  You have slurred speech.  You develop weakness or numbness of your arms or legs.  You have pain, burning, or  bleeding when urinating.  You develop abdominal pain. Document Released: 08/11/2003 Document Revised: 02/04/2012 Document Reviewed: 11/29/2010 [facility] Patient Information 2014 [place], [place].

## 2014-04-01 NOTE — Progress Notes (Addendum)
SRITHA CHAUNCEY 02-Dec-1955 [id number]        58 y.o.  [name] for annual exam.  Several issues noted below.  Past medical history,surgical history, problem list, medications, allergies, family history and social history were all reviewed and documented as reviewed in the EPIC chart.  ROS:  12 system ROS performed with pertinent positives and negatives included in the history, assessment and plan.  Included Systems: General, HEENT, Neck, Cardiovascular, Pulmonary, Gastrointestinal, Genitourinary, Musculoskeletal, Dermatologic, Endocrine, Hematological, Neurologic, Psychiatric Additional significant findings :  None   Exam: Kim assistant Filed Vitals:   04/01/14 0951  BP: 134/86  Height: 5\' 2"  (1.575 m)  Weight: 214 lb (97.07 kg)   General appearance:  Normal affect, orientation and appearance. Skin: Grossly normal HEENT: Without gross lesions.  No cervical or supraclavicular adenopathy. Thyroid normal.  Lungs:  Clear without wheezing, rales or rhonchi Cardiac: RR, without RMG Abdominal:  Soft, nontender, without masses, guarding, rebound, organomegaly. Small umbilical hernia noted. Easily reduces Breasts:  Examined lying and sitting without masses, retractions, discharge or axillary adenopathy. Pelvic:  Ext/BUS/vagina with generalized atrophic changes  Cervix atrophic  Uterus grossly normal size midline mobile  Adnexa  Without gross masses or tenderness    Anus and perineum  Normal   Rectovaginal  Normal sphincter tone without palpated masses or tenderness.    Assessment/Plan:  58 y.o. G44P1001 female for annual exam.   1. Postmenopausal/HRT. Patient on Vivelle 0.05 mg patches and Prometrium 100 mg nightly. Tried stopping last year but acceptable hot flushes. They are somewhat better on the patches but still having hot flushes. Recent TSH normal.  I again reviewed the whole issue of HRT with her to include the WHI study with increased risk of stroke, heart attack, DVT and breast cancer.  The ACOG and NAMS statements for lowest dose for the shortest period of time reviewed. Transdermal versus oral first-pass effect benefit discussed.  Patient wants to continue she understands and accepts the risks. I did suggest increasing to 0.075 patch along with the 100 mg Prometrium to see if this doesn't help with her hot flashes. Patient will call if she has any issues. Patient also knows if she does any vaginal bleeding she needs to call me. 2. Small umbilical hernia. Easily reduces. Signs and symptoms of incarceration reviewed and need to be seen ASAP discussed. 3. Pap smear 2013. No Pap smear done today. Plan repeat Pap smear next year 3 year interval. No history of abnormal Pap smears previously. 4. Mammography 02/2014. Continue with annual mammography. SBE monthly reviewed. 5. DEXA 2010 reported normal. Recommended repeat at age 47. Increase calcium vitamin D reviewed. 6. Colonoscopy 2013. Repeat at their recommended interval. 7. Health maintenance. No blood work done as this is done at her other physician's offices. Followup one year, sooner as needed.   Note: This document was prepared with digital dictation and possible smart phrase technology. Any transcriptional errors that result from this process are unintentional.   [name], 10:23 AM 04/01/2014

## 2014-04-14 ENCOUNTER — Other Ambulatory Visit: Payer: Self-pay | Admitting: Family Medicine

## 2014-04-14 NOTE — Telephone Encounter (Signed)
yes

## 2014-04-14 NOTE — Telephone Encounter (Signed)
Is this okay to refill? 

## 2014-05-13 ENCOUNTER — Other Ambulatory Visit: Payer: Self-pay | Admitting: Family Medicine

## 2014-06-17 ENCOUNTER — Other Ambulatory Visit: Payer: Self-pay | Admitting: Family Medicine

## 2014-06-17 NOTE — Telephone Encounter (Signed)
Is this okay to refill? 

## 2014-06-17 NOTE — Telephone Encounter (Signed)
done

## 2014-07-01 ENCOUNTER — Other Ambulatory Visit: Payer: 59

## 2014-07-05 ENCOUNTER — Encounter: Payer: 59 | Admitting: Family Medicine

## 2014-08-12 ENCOUNTER — Other Ambulatory Visit: Payer: 59

## 2014-08-12 DIAGNOSIS — R7301 Impaired fasting glucose: Secondary | ICD-10-CM

## 2014-08-12 DIAGNOSIS — E785 Hyperlipidemia, unspecified: Secondary | ICD-10-CM

## 2014-08-12 DIAGNOSIS — Z79899 Other long term (current) drug therapy: Secondary | ICD-10-CM

## 2014-08-12 LAB — HEMOGLOBIN A1C
Hgb A1c MFr Bld: 6.4 % — ABNORMAL HIGH (ref <5.7)
Mean Plasma Glucose: 137 mg/dL — ABNORMAL HIGH (ref <117)

## 2014-08-13 LAB — LIPID PANEL
Cholesterol: 108 mg/dL (ref 0–200)
HDL: 40 mg/dL (ref >39)
LDL Cholesterol: 51 mg/dL (ref 0–99)
Total CHOL/HDL Ratio: 2.7 Ratio
Triglycerides: 85 mg/dL (ref <150)
VLDL: 17 mg/dL (ref 0–40)

## 2014-08-13 LAB — COMPREHENSIVE METABOLIC PANEL
ALT: 22 U/L (ref 0–35)
AST: 19 U/L (ref 0–37)
Albumin: 3.7 g/dL (ref 3.5–5.2)
Alkaline Phosphatase: 117 U/L (ref 39–117)
BUN: 19 mg/dL (ref 6–23)
CO2: 26 mEq/L (ref 19–32)
Calcium: 9.2 mg/dL (ref 8.4–10.5)
Chloride: 103 mEq/L (ref 96–112)
Creat: 0.73 mg/dL (ref 0.50–1.10)
Glucose, Bld: 98 mg/dL (ref 70–99)
Potassium: 4 mEq/L (ref 3.5–5.3)
Sodium: 140 mEq/L (ref 135–145)
Total Bilirubin: 0.4 mg/dL (ref 0.2–1.2)
Total Protein: 6.4 g/dL (ref 6.0–8.3)

## 2014-08-16 ENCOUNTER — Ambulatory Visit (INDEPENDENT_AMBULATORY_CARE_PROVIDER_SITE_OTHER): Payer: 59 | Admitting: Family Medicine

## 2014-08-16 ENCOUNTER — Encounter: Payer: Self-pay | Admitting: Family Medicine

## 2014-08-16 VITALS — BP 132/80 | HR 64 | Temp 98.5°F | Ht 62.0 in | Wt 226.0 lb

## 2014-08-16 DIAGNOSIS — J309 Allergic rhinitis, unspecified: Secondary | ICD-10-CM

## 2014-08-16 DIAGNOSIS — R609 Edema, unspecified: Secondary | ICD-10-CM

## 2014-08-16 DIAGNOSIS — Z23 Encounter for immunization: Secondary | ICD-10-CM

## 2014-08-16 DIAGNOSIS — R7301 Impaired fasting glucose: Secondary | ICD-10-CM

## 2014-08-16 DIAGNOSIS — E785 Hyperlipidemia, unspecified: Secondary | ICD-10-CM

## 2014-08-16 NOTE — Patient Instructions (Signed)
Continue to drink plenty of fluids. Continue the flonase and zyrtec daily. Continue the Mucinex. Consider trying sinus rinse kit or neti-pot once or twice daily to help with sinus congestion and pain. Call later this week or next week if your symptoms persist or worsen, specifically having thickened/discolored mucus throughout the day, persistent sinus pain/pressure. We can then call in an antibiotic. Please return if you develop worsening shortness of breath or chest symptoms, for re-evaluation.  You are on the verge of being a diabetic. It is important to try and exercise at least 30 minutes daily (even if in 10 minute intervals), and to lose weight.  I know you are limited in your diet, but trying to avoid sweets including candy, cake, juices, sweet tea, regular sodas (all of which have a lot of sugar) and limit your carb intake (I know this is the main problem). Let me know if you would like to try seeing the nutritionist again. ? Whether or not you can have more vegetables in the form of smoothies.  Ideally, making smoothies at home you can alter the ingredients, limit the sugar, add protein powder and use more vegetables than some of the smoothies you can get in stores/restaurants (which have a lot of sugar and a lot of calories).

## 2014-08-16 NOTE — Progress Notes (Signed)
Chief Complaint  Patient presents with  . med check    med check. also been having some sinus issues. got flu shot today   Patient presents for med check, follow up on labs, and with complaint of URI symptoms.   She has had 1 week of itchy, watery eyes, nasal congestion and scratchy sore throat. She was also coughing up some clear mucus.  Yesterday and today she noted a little bit of yellow in the mucus, mostly in the morning.  She started taking Mucinex a week ago, which seemed to help some.  She had hoarseness last week, which has improved, but she feels like congestion has worsened some. "I just feel yuck".  IFG--A1c was again slightly higher than last check. She tends to eat a lot of carbs with her diet for gastroparesis. Her diet has expanded a little--she has been eating more fruit.  Has has had some salads, but not much (not supposed to have too much fiber).  She has a lot of smoothies--many out, some at home.  Seems to tolerate fruit in smoothies better than eating normally (and vegetables). She had been walking, but leg/hip problem flared up, so not walking as much. Has MRI scheduled tomorrow.  OSA--on CPAP,tolerating well.   Patient had stated at last visit that she does not have h/o HTN. She has been on diuretics for fluid retention, and beta blockers for tremor. Since her last visit, she found that if she skipped a day of the diuretic, her feet would swell, even worse if she skipped 2 days. She also noted that her BP went up to 160/90 on those days--so she went back to taking medication daily. She denies side effects or muscle cramping.  Past Medical History  Diagnosis Date  . Fibromyalgia   . GERD (gastroesophageal reflux disease)   . Obstructive sleep apnea     On Cpap 2009  . Anxiety   . Depression   . Restless leg syndrome   . Edema   . Migraine   . Hyperlipidemia   . Anemia     previously followed by Dr. Jamse Arn for anemia and elevated platelets  . Paresthesia     Dr.  Everette Rank at [facility]  . Tremor     Dr. Everette Rank  . Gastroparesis     followed at [facility]  . Pneumonia     2012  . Plantar fasciitis 02/2011    R foot  . S/P endometrial ablation 08/09/2006    Novasure Ablation  . C. difficile colitis 10/01/12    treated by [facility]  . Dyssynergia     dyssynergenic defecation, contributing to fecal incontinence.  . Kidney stone   . Pelvic floor dysfunction     pelvic floor dyssynergy  . Neuropathy    Past Surgical History  Procedure Laterality Date  . Tonsillectomy  1968  . Knee surgery  1999    R knee, Dr. Eddie Dibbles, torn cartilage  . Cholecystectomy  9/05  . Endometrial ablation  08/09/2006    Dr. Valentina Shaggy Ablation   History   Social History  . Marital Status: Married    Spouse Name: N/A    Number of Children: 2  . Years of Education: N/A   Occupational History  . customer service (on disability) [employer]   Social History Main Topics  . Smoking status: Never Smoker   . Smokeless tobacco: Never Used  . Alcohol Use: No  . Drug Use: No  . Sexual Activity: No  Other Topics Concern  . Not on file   Social History Narrative  . No narrative on file   Outpatient Encounter Prescriptions as of 08/16/2014  Medication Sig Note  . ALPRAZolam (XANAX) 0.25 MG tablet Take 0.25 mg by mouth 2 (two) times daily as needed. For anxiety   . ARIPiprazole (ABILIFY) 2 MG tablet Take 4 mg by mouth every morning. 5 mg. 08/16/2014: Thinks she takes just 1 tablet in the morning--will verify dose when she gets home  . Armodafinil (NUVIGIL) 250 MG tablet Take 250 mg by mouth every morning.    Marland Kitchen atorvastatin (LIPITOR) 40 MG tablet Take 1 tablet (40 mg total) by mouth daily.   . cetirizine (ZYRTEC) 10 MG tablet Take 10 mg by mouth at bedtime.   . clonazePAM (KLONOPIN) 0.5 MG tablet TAKE 1/2 TO 1 TABLET BY MOUTH ONCE DAILY AS NEEDED   . dronabinol (MARINOL) 5 MG capsule Take 10 mg by mouth 2 (two) times daily before a meal.    . DULoxetine  (CYMBALTA) 60 MG capsule Take 60 mg by mouth 2 (two) times daily.   Marland Kitchen estradiol (VIVELLE-DOT) 0.075 MG/24HR Place 1 patch onto the skin 2 (two) times a week.   . fluticasone (FLONASE) 50 MCG/ACT nasal spray Place 2 sprays into both nostrils daily.   Marland Kitchen ibuprofen (ADVIL,MOTRIN) 600 MG tablet Take 1 tablet (600 mg total) by mouth every 12 (twelve) hours.   . methocarbamol (ROBAXIN) 500 MG tablet Take 500 mg by mouth 2 (two) times daily as needed (muscle spasms). For pain   . nortriptyline (PAMELOR) 10 MG capsule Take 40 mg by mouth at bedtime.  08/16/2014: For neuropathy in feet--had to cut back to $Remov'30mg'VWrPax$  (caused too much grogginess at $RemoveBefor'40mg'KEYRlPQORoGV$ )  . pantoprazole (PROTONIX) 40 MG tablet Take 40 mg by mouth 2 (two) times daily.   . pregabalin (LYRICA) 50 MG capsule Take 50 mg by mouth 2 (two) times daily. 12/24/2013: For fibromyalgia  . Probiotic Product (Leggett) CAPS Take 1 capsule by mouth daily.   . progesterone (PROMETRIUM) 100 MG capsule Take 1 capsule (100 mg total) by mouth daily.   . propranolol (INDERAL) 20 MG tablet Take 40 mg by mouth at bedtime.  12/24/2013: Takes 2 at bedtime for tremor, from neurologist  . SUMAtriptan (IMITREX) 50 MG tablet TAKE 1 TABLET (50 MG TOTAL) BY MOUTH EVERY 2 (TWO) HOURS AS NEEDED FOR MIGRAINE. MAX 200 MG/DAY   . topiramate (TOPAMAX) 50 MG tablet Take 50 mg by mouth at bedtime.  12/24/2013: For migraine prevention  . triamterene-hydrochlorothiazide (DYAZIDE) 37.5-25 MG per capsule TAKE 1 EACH (1 CAPSULE TOTAL) BY MOUTH DAILY.   Marland Kitchen docusate sodium (COLACE) 100 MG capsule Take 200 mg by mouth as needed for mild constipation.   . hydrOXYzine (ATARAX/VISTARIL) 25 MG tablet Take 12.5-50 mg by mouth every 6 (six) hours as needed (itching). 03/29/2014: Takes prn itching, not needing  . ondansetron (ZOFRAN) 4 MG tablet Take 4 mg by mouth every 8 (eight) hours as needed (nausea).  03/29/2014: Prn nausea; uses occasionally  . promethazine (PHENERGAN) 25 MG suppository Place  25 mg rectally every 6 (six) hours as needed for nausea or vomiting.    Allergies  Allergen Reactions  . Erythromycin Nausea Only  . Tramadol Itching  . Dilaudid [Hydromorphone Hcl] Itching   ROS:  No fever/chills. URI/allergy symptoms as per HPI.  No shortness of breath, chest pain.  GI complaints (n/v) stable.  No bowel changes. Hands tremble some.  Hasn't had  further episodes where she had trouble finding her words during episodes of clamminess (like she mentioned at last visit in May) Some DOE since onset of recent illness.  No urinary complaints, bleeding, bruising, rash.  PHYSICAL EXAM: BP 132/80  Pulse 64  Temp(Src) 98.5 F (36.9 C) (Oral)  Ht _0  (1.575 m)  Wt 226 lb (102.513 kg)  BMI 41.33 kg/m2  LMP 07/27/2006 Pleasant female, in no distress.  Frequent sniffling, no cough. HEENT:  PERRL, EOMI, conjunctiva clear.  TM's and EAC's normal. Cold sore on lower lip. Nasal mucosa moderately edematous.  No purulence. Some yellow crust in R nare. Very mild tenderness at maxillary sinuses R>L. OP clear Neck: no lymphadenopathy, thyromegaly or mass Heart: regular rate and rhythm Lungs: clear bilaterally Abdomen: soft, nontender, no mass Extremities: no edema Skin: no rashes or bruising Psych: normal mood, affect, hygiene and grooming Neuro: alert and oriented. Normal gait, strength  Lab Results  Component Value Date   HGBA1C 6.4* 08/12/2014   Lab Results  Component Value Date   CHOL 108 08/12/2014   HDL 40 08/12/2014   LDLCALC 51 08/12/2014   TRIG 85 08/12/2014   CHOLHDL 2.7 08/12/2014     Chemistry      Component Value Date/Time   NA 140 08/12/2014 1027   K 4.0 08/12/2014 1027   CL 103 08/12/2014 1027   CO2 26 08/12/2014 1027   BUN 19 08/12/2014 1027   CREATININE 0.73 08/12/2014 1027   CREATININE 1.23* 02/19/2013 2111      Component Value Date/Time   CALCIUM 9.2 08/12/2014 1027   ALKPHOS 117 08/12/2014 1027   AST 19 08/12/2014 1027   ALT 22 08/12/2014 1027   BILITOT 0.4  08/12/2014 1027     Fasting glucose 98  ASSESSMENT/PLAN:  Impaired fasting glucose - A1c gradually rising.  decline nutrition consult.  counseled extensively re: diet, exercise, weight loss.  recheck A1c 3 mos  Need for prophylactic vaccination and inoculation against influenza - Plan: Flu Vaccine QUAD 36+ mos IM  Hyperlipidemia - Lipids at goal  Edema - requiring daily diuretic (which might also be helping BP).  Reviewed low sodium diet in detail. continue daily med  Allergic rhinitis, cause unspecified - with some sinus congestion; supportive measures reviewed, and s/sx of infection. Call for ABX if worsening   Continue to drink plenty of fluids. Continue the flonase and zyrtec daily. Continue the Mucinex. Consider trying sinus rinse kit or neti-pot once or twice daily to help with sinus congestion and pain. Call later this week or next week if your symptoms persist or worsen, specifically having thickened/discolored mucus throughout the day, persistent sinus pain/pressure. We can then call in an antibiotic. Please return if you develop worsening shortness of breath or chest symptoms, for re-evaluation.  Counseled re: diet, fruits vs vegetables, smoothies (quality/quantity, not all equal). Counseled re: exercise and weight loss. Declines f/u with nutritionist at this time--would like to try and work on it herself a little more (nutritionist wasn't all that helpful in past).  May need to add ACEI (and consider taper down (but not off) diuretic, if needed if BP doesn't tolerate adding it.  F/u in 3 months. A1c at visit  Monitor BP's--bring list to f/u (to see where baseline BP is, to help determine dose of ACEI, if need to add it.

## 2014-08-19 DIAGNOSIS — M5136 Other intervertebral disc degeneration, lumbar region: Secondary | ICD-10-CM

## 2014-08-19 DIAGNOSIS — M51369 Other intervertebral disc degeneration, lumbar region without mention of lumbar back pain or lower extremity pain: Secondary | ICD-10-CM

## 2014-08-19 HISTORY — DX: Other intervertebral disc degeneration, lumbar region: M51.36

## 2014-08-19 HISTORY — DX: Other intervertebral disc degeneration, lumbar region without mention of lumbar back pain or lower extremity pain: M51.369

## 2014-08-25 ENCOUNTER — Other Ambulatory Visit: Payer: Self-pay | Admitting: Family Medicine

## 2014-08-25 NOTE — Telephone Encounter (Signed)
Is this okay to call in? 

## 2014-08-25 NOTE — Telephone Encounter (Signed)
Ok to refill.  I couldn't tell from Dr. Pecola Lawless last note if he rx'd or not

## 2014-09-06 ENCOUNTER — Encounter: Payer: Self-pay | Admitting: *Deleted

## 2014-09-20 DIAGNOSIS — Z92241 Personal history of systemic steroid therapy: Secondary | ICD-10-CM

## 2014-09-20 HISTORY — DX: Personal history of systemic steroid therapy: Z92.241

## 2014-09-27 ENCOUNTER — Encounter: Payer: Self-pay | Admitting: *Deleted

## 2014-09-30 ENCOUNTER — Encounter: Payer: Self-pay | Admitting: *Deleted

## 2014-10-11 ENCOUNTER — Telehealth: Payer: Self-pay | Admitting: Family Medicine

## 2014-10-11 NOTE — Telephone Encounter (Signed)
Left message for patient to return my call.

## 2014-10-11 NOTE — Telephone Encounter (Signed)
Last filled 9/30.  Directions state 1/2-1 daily, so she is taking a little more than 1/day.  Okay to refill #60, but please verify how she takes this (I believe that I was prescribing this for her as a courtesy, originally rx'd by her neurologist.  Neurologist had recommended trying to cut back on dose/frequency).

## 2014-10-11 NOTE — Telephone Encounter (Signed)
Patient called, needs refill on klonopin  [facility]

## 2014-10-13 MED ORDER — CLONAZEPAM 0.5 MG PO TABS: ORAL_TABLET | ORAL | Status: DC

## 2014-10-13 NOTE — Telephone Encounter (Signed)
Spoke with patient and she is taking 1 per day and will continue to only take one per day so her #60 should last her 2 months. Pt verbalized understanding.

## 2014-10-28 ENCOUNTER — Encounter: Payer: Self-pay | Admitting: Family Medicine

## 2014-10-28 ENCOUNTER — Ambulatory Visit (INDEPENDENT_AMBULATORY_CARE_PROVIDER_SITE_OTHER): Payer: 59 | Admitting: Family Medicine

## 2014-10-28 VITALS — BP 118/70 | HR 84 | Ht 62.0 in | Wt 213.0 lb

## 2014-10-28 DIAGNOSIS — M79674 Pain in right toe(s): Secondary | ICD-10-CM

## 2014-10-28 NOTE — Progress Notes (Signed)
Chief Complaint  Patient presents with  . Toe Pain    right great toe-hard place on bottom of toe with black spot in the middle. Skin in tough. Began soaking this past Sunday in vinegar water which has helped. (down 13 lbs from last visit)  . Headache    also mentions that she has had headache for the past 4-5 days.    Sunday she noticed a hard, large tender spot on the bottom of her right great toe, with a black central area.  No known trauma, change in activity, change in shoes. Denies any known foreign body. She started doing soaks in vinegar and water, twice daily.  Swelling has gone down, the redness in the whole toe has resolved.  The dark black center has improved (although there is still something in the center). When she first called Monday, it was painful, and she was concerned due to pre-diabetes.  It has gotten better since then.  PMH, PSH, SH, meds and allergies were reviewed.  ROS:  No fever, chills, URI symptoms, GI symptoms (just her chronic ones), bleeding, bruising, rash, or other complaints.  Chronic neuropathy, unchanged.  PHYSICAL EXAM: BP 118/70 mmHg  Pulse 84  Ht 5\' 2"  (1.575 m)  Wt 213 lb (96.616 kg)  BMI 38.95 kg/m2  LMP 07/27/2006 Pleasant female in no distress Right great toe:  On bottom, there is a thickened/calloused area with central dark spot. Cleansed with alcohol and debrided with 10 blade-- No core or seeds noted; debrided dead tissue.  Much of black area was removed. There was slight bleeding with procedure.  Lab Results  Component Value Date   HGBA1C 6.4* 08/12/2014   ASSESSMENT/PLAN:  Toe pain, right - some callous, and possible foreign body.  no evidence of infection.  Pared down/debrided   Continue soaks 2-3 times/day.  Return if increasing swelling, pain, fever, drainage. Keep it covered and clean.  Do not walk barefoot. If ongoing issues with callous, can refer to podiatrist.

## 2014-10-28 NOTE — Patient Instructions (Signed)
   Continue soaks 2-3 times/day.  Return if increasing swelling, pain, fever, drainage. Keep it covered and clean.  Do not walk barefoot. If ongoing issues with callous, can refer to podiatrist.

## 2014-10-29 ENCOUNTER — Encounter: Payer: Self-pay | Admitting: Family Medicine

## 2014-11-11 ENCOUNTER — Ambulatory Visit (INDEPENDENT_AMBULATORY_CARE_PROVIDER_SITE_OTHER): Payer: 59 | Admitting: Family Medicine

## 2014-11-11 ENCOUNTER — Encounter: Payer: Self-pay | Admitting: Family Medicine

## 2014-11-11 VITALS — BP 110/60 | HR 72 | Ht 62.0 in | Wt 213.0 lb

## 2014-11-11 DIAGNOSIS — M79674 Pain in right toe(s): Secondary | ICD-10-CM

## 2014-11-11 DIAGNOSIS — H5712 Ocular pain, left eye: Secondary | ICD-10-CM

## 2014-11-11 DIAGNOSIS — R7301 Impaired fasting glucose: Secondary | ICD-10-CM

## 2014-11-11 LAB — POCT GLYCOSYLATED HEMOGLOBIN (HGB A1C): Hemoglobin A1C: 5.5

## 2014-11-11 NOTE — Patient Instructions (Signed)
You may stop soaking the toe--it looks much better.  Keep using the antibacterial ointment until the small abrasion/cut has completely healed.  Return if increasing pain, swelling, redness or drainage develops.  There is no evidence of corneal abrasion or infection in the eye.  Do not wear your contacts until eye pain/irritation has resolved.  Use natural tears or saline drops as needed.  Congratulations on getting the sugar down.  Keep up the good work with 30 minutes of exercise daily, and keep losing the weight.

## 2014-11-11 NOTE — Progress Notes (Signed)
Chief Complaint  Patient presents with  . Hypertension    nonfasting med check. Also still having right toe pain. Also if you have time can you look at her left eye-woke up with pain and and discharge around her eye. Did forget to take her triam this am.    Last night her left eye started hurting like she had something in it.  She wears contacts, was wearing it at the time yesterday. She took her contact out.  This morning her eye was crusted.  It was red last night.  She is having some eye discomfort, much less than the pain last night.  She is not wearing contacts today.  The eye is watering just a little.  Eye isn't bothered by the light (but not sunny today). Denies vision changes.  Seen 12/2 with right great toe pain.  It was debrided some. She has been soaking it, and much of the callous and thickened skin has resolved.  It seems to hurt more after soaking.  No significant discomfort with walking.  IFG--patient presents today to follow up on pre-diabetes.  Her last A1c in September had crept up to 6.4.  She has been very careful over the last 3 months--She has tried to eat more salad, and cut back a little on the starches.  She has lost 13 pounds since that September visit.  She tries to walk 30 minutes daily.  It is suspected that patient has hypertension.  She previously reported that she has been on diuretics for fluid retention, and beta blockers for tremor, never diagnosed with HTN. However, she has also found that if she skipped a day of the diuretic,  BP went up to 160/90 on those days (along with recurrent swelling)--so she went back to taking medication daily. She denies side effects or muscle cramping.  Allergies: doing pretty well.  Using Flonase and zyrtec every day.  PMH, PSH, SH reviewed.  Outpatient Encounter Prescriptions as of 11/11/2014  Medication Sig Note  . ALPRAZolam (XANAX) 0.25 MG tablet Take 0.25 mg by mouth 2 (two) times daily as needed. For anxiety   .  ARIPiprazole (ABILIFY) 2 MG tablet Take 4 mg by mouth every morning. 5 mg. 11/11/2014: Takes just 1 tablet daily (2mg )  . Armodafinil (NUVIGIL) 250 MG tablet Take 250 mg by mouth every morning.    Marland Kitchen atorvastatin (LIPITOR) 40 MG tablet Take 1 tablet (40 mg total) by mouth daily.   . cetirizine (ZYRTEC) 10 MG tablet Take 10 mg by mouth at bedtime.   . clonazePAM (KLONOPIN) 0.5 MG tablet TAKE 1/2 TO 1 TABLET EVERY DAY AS NEEDED 11/11/2014: Takes 1 at night  . docusate sodium (COLACE) 100 MG capsule Take 200 mg by mouth as needed for mild constipation.   Marland Kitchen dronabinol (MARINOL) 5 MG capsule Take 10 mg by mouth 2 (two) times daily before a meal.    . DULoxetine (CYMBALTA) 60 MG capsule Take 60 mg by mouth 2 (two) times daily.   Marland Kitchen estradiol (VIVELLE-DOT) 0.075 MG/24HR Place 1 patch onto the skin 2 (two) times a week.   . fluticasone (FLONASE) 50 MCG/ACT nasal spray Place 2 sprays into both nostrils daily.   Marland Kitchen ibuprofen (ADVIL,MOTRIN) 600 MG tablet Take 1 tablet (600 mg total) by mouth every 12 (twelve) hours. 11/11/2014: Uses prn (given by ortho for hip)  . nortriptyline (PAMELOR) 10 MG capsule Take 40 mg by mouth at bedtime.  08/16/2014: For neuropathy in feet--had to cut back to 30mg  (caused  too much grogginess at 40mg )  . ondansetron (ZOFRAN) 4 MG tablet Take 4 mg by mouth every 8 (eight) hours as needed (nausea).  03/29/2014: Prn nausea; uses occasionally  . pantoprazole (PROTONIX) 40 MG tablet Take 40 mg by mouth 2 (two) times daily.   . pregabalin (LYRICA) 50 MG capsule Take 50 mg by mouth 2 (two) times daily. 11/11/2014: Pt states she is on 75mg  twice daily.  Originally for fibromyalgia, but also for neuropathy.  Dose increased to 225mg  10/2014 by Dr. Valaria Good, but pt only taking 1 BID (75mg  dose)  . Probiotic Product ([place]) CAPS Take 1 capsule by mouth daily.   . progesterone (PROMETRIUM) 100 MG capsule Take 1 capsule (100 mg total) by mouth daily.   . propranolol (INDERAL) 20 MG  tablet Take 40 mg by mouth at bedtime.  12/24/2013: Takes 2 at bedtime for tremor, from neurologist  . triamterene-hydrochlorothiazide (DYAZIDE) 37.5-25 MG per capsule TAKE 1 EACH (1 CAPSULE TOTAL) BY MOUTH DAILY.   . hydrOXYzine (ATARAX/VISTARIL) 25 MG tablet Take 12.5-50 mg by mouth every 6 (six) hours as needed (itching). 03/29/2014: Takes prn itching, not needing  . methocarbamol (ROBAXIN) 500 MG tablet Take 500 mg by mouth 2 (two) times daily as needed (muscle spasms). For pain 11/11/2014: Uses prn for fibromyalgia (neck/upper back pain), about 3x/week lately  . promethazine (PHENERGAN) 25 MG suppository Place 25 mg rectally every 6 (six) hours as needed for nausea or vomiting.   . SUMAtriptan (IMITREX) 50 MG tablet TAKE 1 TABLET (50 MG TOTAL) BY MOUTH EVERY 2 (TWO) HOURS AS NEEDED FOR MIGRAINE. MAX 200 MG/DAY (Patient not taking: Reported on 11/11/2014)   . topiramate (TOPAMAX) 50 MG tablet Take 50 mg by mouth at bedtime.  12/24/2013: For migraine prevention   She states she went back to the 75mg  BID of Lyrica--tried taking total of 225mg  after last visit with Dr Valaria Good.  She thinks it made her tired, and was contributing to weight gain.  She is back to taking it 75mg  BID.  Allergies  Allergen Reactions  . Erythromycin Nausea Only  . Tramadol Itching  . Dilaudid [Hydromorphone Hcl] Itching    ROS: No fever/chills. No shortness of breath, chest pain. GI complaints (n/v) stable. No bowel changes. +intentional weight loss (none since recent visit). No URI symptoms, cough.  Some shortness of breath only when leaning forward, or with some exertion that she relates to being due to her weight. No urinary complaints, bleeding, bruising, rash.  PHYSICAL EXAM: BP 110/60 mmHg  Pulse 72  Ht 5\' 2"  (1.575 m)  Wt 213 lb (96.616 kg)  BMI 38.95 kg/m2  LMP 07/27/2006 Pleasant, well-appearing female in no distress HEENT:  PERRL, EOMI, conjunctiva is clear--minimally injected medially on the left.   There is some watering, but no crusting. No purulence of soft tissue swelling. No foreign body noted.  No abnormal uptake of fluorescein Neck: no lymphadenopathy or mass Heart: regular rate and rhythm Lungs: clear bilaterally Abdomen: soft,nontender, no mass Extremities: 2+ pulses. Bottom of right great toe--no longer calloused--skin looks much better.  There are two areas of small abrasions, without any infection.  Slight discoloration remains in the center, but this is no longer hard, and is nontender. Neuro: alert and oriented.  Cranial nerves intact. Normal strength, gait Psych: normal mood, affect, hygiene and grooming.   Lab Results  Component Value Date   HGBA1C 5.5 11/11/2014   (down from 6.4 in September)  ASSESSMENT/PLAN:  IFG (impaired fasting glucose) -  significantly improved s/p dietary changes and weight loss.  - Plan: HgB A1c  Left eye pain - reassured no FB or corneal abrasion.  use natural tears/lubricant.  f/u if symptoms persist, worsen, change  Pain of toe of right foot - improved;  small abrasion.  no evidence of infection.  supportive/protective care.  to use neospoin.  f/u here or with podiatrist if not resolving.   F/u in 6 months

## 2014-11-17 ENCOUNTER — Other Ambulatory Visit: Payer: Self-pay | Admitting: Family Medicine

## 2014-11-17 NOTE — Telephone Encounter (Signed)
Is this okay to refill? 

## 2014-11-23 ENCOUNTER — Ambulatory Visit (INDEPENDENT_AMBULATORY_CARE_PROVIDER_SITE_OTHER): Payer: 59 | Admitting: Medical

## 2014-11-23 VITALS — BP 110/80 | HR 112 | Temp 98.5°F | Wt 210.5 lb

## 2014-11-23 DIAGNOSIS — R05 Cough: Secondary | ICD-10-CM

## 2014-11-23 DIAGNOSIS — R000 Tachycardia, unspecified: Secondary | ICD-10-CM

## 2014-11-23 DIAGNOSIS — R059 Cough, unspecified: Secondary | ICD-10-CM

## 2014-11-23 DIAGNOSIS — H6500 Acute serous otitis media, unspecified ear: Secondary | ICD-10-CM

## 2014-11-23 MED ORDER — AMOXICILLIN 500 MG PO TABS: ORAL_TABLET | ORAL | Status: DC

## 2014-11-23 NOTE — Progress Notes (Signed)
Subjective: Been feeling bad since this past Wednesday x 6 days.   She reports chest and head congestion, cough, ear pain, headache, sore throat.  Taking mucinex for symptoms.  +sick contacts.  Nonsmoker.   No chest pain, no leg swelling, no fever, no vomiting.  Has had some nausea with coughing spells.  Has gastroparesis, so the nausea may be from this.   No other aggravating or relieving factors. No other complaint.  ROS as in subjective  Objective Filed Vitals:   11/23/14 1344  BP: 110/80  Pulse: 112  Temp: 98.5 F (36.9 C)    General appearance: alert, no distress, WD/WN HEENT: normocephalic, sclerae anicteric, left TM with serous effusion, right TM bulging and serous effusion, slight erythema, nares patent, no discharge or erythema, pharynx with mild erythema Oral cavity: MMM, no lesions Neck: supple, no lymphadenopathy, no thyromegaly, no masses Heart: tachycardiac, but otherwise RRR, normal S1, S2, no murmurs Lungs: decreased breath sounds in general, no wheezes, rhonchi, or rales Ext: no edema   Assessment: Encounter Diagnoses  Name Primary?  . Acute serous otitis media, recurrence not specified, unspecified laterality Yes  . Cough   . Tachycardia      Plan Begin amoxicillin, rest, hydrate well, can c/t mucinex.   Call back within 48 hours to update [place] on symptoms.   discussed checking pulse and letting us know pulse rate when she calls back.  I suspect heart rate up due to acute infection, but no sign of pneumonia, no other contributing factor obvious.

## 2014-11-25 ENCOUNTER — Telehealth: Payer: Self-pay | Admitting: Internal Medicine

## 2014-11-25 NOTE — Telephone Encounter (Signed)
Ok.  Pulse of 80 is improved.  C/t same medication, rest, hydrate well, and give medication more time.  Tell her thanks for calling back, as I wanted to make sure pulse rate was improving.

## 2014-11-25 NOTE — Telephone Encounter (Signed)
The pulse of 80 was from yesterday and its 100 today. Pt called back to clarify that.

## 2014-11-25 NOTE — Telephone Encounter (Signed)
Pt states that she still has her cold. She is feeling alittle bit better but still has the cough. Pulse rate was 80%

## 2014-11-29 ENCOUNTER — Other Ambulatory Visit: Payer: Self-pay | Admitting: Medical

## 2014-11-29 ENCOUNTER — Telehealth: Payer: Self-pay | Admitting: Family Medicine

## 2014-11-29 MED ORDER — BENZONATATE 100 MG PO CAPS: 100.0000 mg | ORAL_CAPSULE | Freq: Two times a day (BID) | ORAL | Status: DC | PRN

## 2014-11-29 NOTE — Telephone Encounter (Signed)
Patient is aware that a Rx was sent to her pharmacy

## 2014-11-29 NOTE — Telephone Encounter (Signed)
I sent Tessalon Perles cough drops she can use 2-3 times daily for cough suppression.

## 2014-11-29 NOTE — Telephone Encounter (Signed)
Pt says she is feeling much better except for cough now. What can [name] suggest for her dry, hacky cough? Cough is even waking her up in the night

## 2014-12-06 ENCOUNTER — Other Ambulatory Visit: Payer: Self-pay | Admitting: Family Medicine

## 2015-01-18 ENCOUNTER — Other Ambulatory Visit: Payer: Self-pay | Admitting: Family Medicine

## 2015-02-01 ENCOUNTER — Other Ambulatory Visit: Payer: Self-pay

## 2015-02-01 DIAGNOSIS — Z1231 Encounter for screening mammogram for malignant neoplasm of breast: Secondary | ICD-10-CM

## 2015-02-16 ENCOUNTER — Telehealth: Payer: Self-pay | Admitting: Family Medicine

## 2015-02-16 NOTE — Telephone Encounter (Signed)
If the tear is not new, it just likely got irritated by wearing the clip earring, and doesn't likely need suturing.  Apply pressure to stop the bleeding, and apply antibacterial ointment until it is healed to prevent infection.  (would be different if she acutely tore the hole open).

## 2015-02-16 NOTE — Telephone Encounter (Signed)
Please call has question about ear piercing that has torn Has been torn for a while but she just stopped wearing earrings . Today she wore a pair of clip ons and then realized when she took them off that the earring had actually gone thru the hole in her ear and she is having some bleeding She is wondering if she needs to have the ear stitched and if so can you do that

## 2015-02-16 NOTE — Telephone Encounter (Signed)
Patient informed and verbalized understanding

## 2015-03-04 ENCOUNTER — Other Ambulatory Visit: Payer: Self-pay | Admitting: Family Medicine

## 2015-03-04 NOTE — Telephone Encounter (Signed)
Is this okay to refill? 

## 2015-03-04 NOTE — Telephone Encounter (Signed)
Ok to refill #60, no add'l refill

## 2015-03-10 ENCOUNTER — Ambulatory Visit
Admission: RE | Admit: 2015-03-10 | Discharge: 2015-03-10 | Disposition: A | Discharge status: Home / Self Care | Payer: PRIVATE HEALTH INSURANCE | Source: Ambulatory Visit

## 2015-03-10 DIAGNOSIS — Z1231 Encounter for screening mammogram for malignant neoplasm of breast: Secondary | ICD-10-CM

## 2015-04-06 ENCOUNTER — Encounter: Payer: Self-pay | Admitting: Family Medicine

## 2015-04-06 ENCOUNTER — Ambulatory Visit (INDEPENDENT_AMBULATORY_CARE_PROVIDER_SITE_OTHER): Payer: 59 | Admitting: Family Medicine

## 2015-04-06 ENCOUNTER — Encounter: Payer: Self-pay | Admitting: Gynecology

## 2015-04-06 VITALS — BP 112/70 | HR 84 | Ht 62.0 in | Wt 213.0 lb

## 2015-04-06 DIAGNOSIS — S01311A Laceration without foreign body of right ear, initial encounter: Secondary | ICD-10-CM | POA: Diagnosis not present

## 2015-04-06 NOTE — Progress Notes (Signed)
Chief Complaint  Patient presents with  . Earlobe    earlobe was torn in the recent past. Not painful or bleeding any longer but is now healed into two parts-wants to make sure this is okay.    3-4 days ago she noticed that her right earlobe was split.  Doesn't recall any trauma, injury or bleeding in these last few days, denies any pain.  She recalls wearing earrings a few months ago, and that the hole was stretching down, but not all the way through.  She only wore studs, never any heavy earrings. Stopped wearing earrings so it wouldn't tear.  She wore a clip earring 2 months ago, and it was painful, and was bleeding.  It healed up, but she didn't notice the full tear until 3-4 days ago.  She is wondering if it needs to be repaired or treated.    She is worried about the appearance, thinks she would like it fixed, but has a lot going on right now. Father was recently put in a nursing home, mother is having somewhat of a hard time with the adjustment.  Son is getting married in [place] soon.  PMH, PSH, SH reviewed Meds reviewed Allergies  Allergen Reactions  . Erythromycin Nausea Only  . Tramadol Itching  . Dilaudid [Hydromorphone Hcl] Itching   ROS: no fevers, chills, bleeding, bruising, rash, URI symptoms, chest pain, or other new complaints.  PHYSICAL EXAM: BP 112/70 mmHg  Pulse 84  Ht 5\' 2"  (1.575 m)  Wt 213 lb (96.616 kg)  BMI 38.95 kg/m2  LMP 07/27/2006  Pleasant female in no distress Right earlobe--has a vertical line/tear that easily separates.  The very inferiormost portion of the split lobe is slightly hyperkeratotic--doesn't appear to be a scab, but is slightly rough.  The anterior portion is smooth.  No recent bleeding, no weeping, crusting, erythema. Wound edges otherwise appear completely healed and healthy. The left earlobe has a similar linear appearance, but when separated, it is clear that the hole does NOT extend down to split the lobe as the right one  does.  ASSESSMENT/PLAN:  Torn earlobe, right, initial encounter   Split in earlobe, apparently of recent onset, but has healed well without evidence of infection. She will not be able to wear earrings with the large central split (unless wears it in a different location).  Discussed options including referral to plastic surgery (vs ENT) for repair.  This is likely considered cosmetic, and may not be covered by her insurance.   She was reassurred that it doesn't appear very bad (just a line)  Declines referral right now--too much going on.

## 2015-04-06 NOTE — Patient Instructions (Signed)
Your earlobe separation/split is not of any medical concern.  It has healed, and there is no evidence of infection. There is a slight rough area--you might want to use bacitracin or other antibacterial ointment 2-3 times daily just until this area resolves.  Let us know if you would like ot move forward with getting this repaired.  As we discussed, this may not be covered by your insurance.  I would need to check if plastic surgery vs ENT would be the appropriate referral.

## 2015-04-07 ENCOUNTER — Ambulatory Visit (INDEPENDENT_AMBULATORY_CARE_PROVIDER_SITE_OTHER): Payer: 59 | Admitting: Gynecology

## 2015-04-07 ENCOUNTER — Encounter: Payer: Self-pay | Admitting: Gynecology

## 2015-04-07 ENCOUNTER — Other Ambulatory Visit (HOSPITAL_COMMUNITY)
Admission: RE | Admit: 2015-04-07 | Discharge: 2015-04-07 | Disposition: A | Discharge status: Home / Self Care | Payer: PRIVATE HEALTH INSURANCE | Source: Ambulatory Visit | Attending: Gynecology | Admitting: Gynecology

## 2015-04-07 VITALS — BP 124/84 | Ht 63.0 in | Wt 213.0 lb

## 2015-04-07 DIAGNOSIS — N952 Postmenopausal atrophic vaginitis: Secondary | ICD-10-CM

## 2015-04-07 DIAGNOSIS — Z01419 Encounter for gynecological examination (general) (routine) without abnormal findings: Secondary | ICD-10-CM

## 2015-04-07 NOTE — Progress Notes (Signed)
[name] 09-29-1956 [id number]        59 y.o.  [name] for annual exam.  Doing well without complaints.  Past medical history,surgical history, problem list, medications, allergies, family history and social history were all reviewed and documented as reviewed in the EPIC chart.  ROS:  Performed with pertinent positives and negatives included in the history, assessment and plan.   Additional significant findings :  none   Exam: [name] Vitals:   04/07/15 0955  BP: 124/84  Height: 5\' 3"  (1.6 m)  Weight: 213 lb (96.616 kg)   General appearance:  Normal affect, orientation and appearance. Skin: Grossly normal HEENT: Without gross lesions.  No cervical or supraclavicular adenopathy. Thyroid normal.  Lungs:  Clear without wheezing, rales or rhonchi Cardiac: RR, without RMG Abdominal:  Soft, nontender, without masses, guarding, rebound, organomegaly. Small umbilical hernia noted, easily reducible Breasts:  Examined lying and sitting without masses, retractions, discharge or axillary adenopathy. Pelvic:  Ext/BUS/vagina with atrophic changes  Cervix with atrophic changes. Pap smear done  Uterus anteverted, normal size, shape and contour, midline and mobile nontender   Adnexa  Without masses or tenderness    Anus and perineum  Normal   Rectovaginal  Normal sphincter tone without palpated masses or tenderness.    Assessment/Plan:  58 y.o. G88P1001 female for annual exam.   1. Postmenopausal. Had been on HRT but weaned herself off and doing well without significant hot flushes, night sweats, vaginal dryness. No vaginal bleeding. Continue to monitor and report any vaginal bleeding. 2. Umbilical hernia. Stable on exam. Easily reducible. Precautions as far as incarceration reviewed. 3. Pap smear 2013. Pap smear done today. No history of abnormal Pap smears previously.Continue with every 3 year screening per current screening guidelines. 4. Mammography 02/2015. Continue with  annual mammography. SBE monthly reviewed. 5. DEXA 2010 reported normal. Plan repeat at age 45. Increase calcium and vitamin D reviewed. 6. Colonoscopy 2014. Repeat at their recommended interval. 7. Health maintenance. No routine blood work done as patient reports this done at her primary physician's office. Follow up 1 year, sooner as needed.     Leah Auerbach MD, 11:01 AM 04/07/2015

## 2015-04-07 NOTE — Addendum Note (Signed)
Addended by: Nelva Nay on: 04/07/2015 11:20 AM   Modules accepted: Orders

## 2015-04-07 NOTE — Patient Instructions (Signed)
You may obtain a copy of any labs that were done today by logging onto MyChart as outlined in the instructions provided with your AVS (after visit summary). The office will not call with normal lab results but certainly if there are any significant abnormalities then we will contact you.   Health Maintenance, Female A healthy lifestyle and preventative care can promote health and wellness.  Maintain regular health, dental, and eye exams.  Eat a healthy diet. Foods like vegetables, fruits, whole grains, low-fat dairy products, and lean protein foods contain the nutrients you need without too many calories. Decrease your intake of foods high in solid fats, added sugars, and salt. Get information about a proper diet from your caregiver, if necessary.  Regular physical exercise is one of the most important things you can do for your health. Most adults should get at least 150 minutes of moderate-intensity exercise (any activity that increases your heart rate and causes you to sweat) each week. In addition, most adults need muscle-strengthening exercises on 2 or more days a week.   Maintain a healthy weight. The body mass index (BMI) is a screening tool to identify possible weight problems. It provides an estimate of body fat based on height and weight. Your caregiver can help determine your BMI, and can help you achieve or maintain a healthy weight. For adults 20 years and older:  A BMI below 18.5 is considered underweight.  A BMI of 18.5 to 24.9 is normal.  A BMI of 25 to 29.9 is considered overweight.  A BMI of 30 and above is considered obese.  Maintain normal blood lipids and cholesterol by exercising and minimizing your intake of saturated fat. Eat a balanced diet with plenty of fruits and vegetables. Blood tests for lipids and cholesterol should begin at age 61 and be repeated every 5 years. If your lipid or cholesterol levels are high, you are over 50, or you are a high risk for heart  disease, you may need your cholesterol levels checked more frequently.Ongoing high lipid and cholesterol levels should be treated with medicines if diet and exercise are not effective.  If you smoke, find out from your caregiver how to quit. If you do not use tobacco, do not start.  Lung cancer screening is recommended for adults aged 33 80 years who are at high risk for developing lung cancer because of a history of smoking. Yearly low-dose computed tomography (CT) is recommended for people who have at least a 30-pack-year history of smoking and are a current smoker or have quit within the past 15 years. A pack year of smoking is smoking an average of 1 pack of cigarettes a day for 1 year (for example: 1 pack a day for 30 years or 2 packs a day for 15 years). Yearly screening should continue until the smoker has stopped smoking for at least 15 years. Yearly screening should also be stopped for people who develop a health problem that would prevent them from having lung cancer treatment.  If you are pregnant, do not drink alcohol. If you are breastfeeding, be very cautious about drinking alcohol. If you are not pregnant and choose to drink alcohol, do not exceed 1 drink per day. One drink is considered to be 12 ounces (355 mL) of beer, 5 ounces (148 mL) of wine, or 1.5 ounces (44 mL) of liquor.  Avoid use of street drugs. Do not share needles with anyone. Ask for help if you need support or instructions about stopping  the use of drugs.  High blood pressure causes heart disease and increases the risk of stroke. Blood pressure should be checked at least every 1 to 2 years. Ongoing high blood pressure should be treated with medicines, if weight loss and exercise are not effective.  If you are 59 to 58 years old, ask your caregiver if you should take aspirin to prevent strokes.  Diabetes screening involves taking a blood sample to check your fasting blood sugar level. This should be done once every 3  years, after age 91, if you are within normal weight and without risk factors for diabetes. Testing should be considered at a younger age or be carried out more frequently if you are overweight and have at least 1 risk factor for diabetes.  Breast cancer screening is essential preventative care for women. You should practice "breast self-awareness." This means understanding the normal appearance and feel of your breasts and may include breast self-examination. Any changes detected, no matter how small, should be reported to a caregiver. Women in their 66s and 30s should have a clinical breast exam (CBE) by a caregiver as part of a regular health exam every 1 to 3 years. After age 101, women should have a CBE every year. Starting at age 100, women should consider having a mammogram (breast X-ray) every year. Women who have a family history of breast cancer should talk to their caregiver about genetic screening. Women at a high risk of breast cancer should talk to their caregiver about having an MRI and a mammogram every year.  Breast cancer gene (BRCA)-related cancer risk assessment is recommended for women who have family members with BRCA-related cancers. BRCA-related cancers include breast, ovarian, tubal, and peritoneal cancers. Having family members with these cancers may be associated with an increased risk for harmful changes (mutations) in the breast cancer genes BRCA1 and BRCA2. Results of the assessment will determine the need for genetic counseling and BRCA1 and BRCA2 testing.  The Pap test is a screening test for cervical cancer. Women should have a Pap test starting at age 57. Between ages 25 and 35, Pap tests should be repeated every 2 years. Beginning at age 37, you should have a Pap test every 3 years as long as the past 3 Pap tests have been normal. If you had a hysterectomy for a problem that was not cancer or a condition that could lead to cancer, then you no longer need Pap tests. If you are  between ages 50 and 76, and you have had normal Pap tests going back 10 years, you no longer need Pap tests. If you have had past treatment for cervical cancer or a condition that could lead to cancer, you need Pap tests and screening for cancer for at least 20 years after your treatment. If Pap tests have been discontinued, risk factors (such as a new sexual partner) need to be reassessed to determine if screening should be resumed. Some women have medical problems that increase the chance of getting cervical cancer. In these cases, your caregiver may recommend more frequent screening and Pap tests.  The human papillomavirus (HPV) test is an additional test that may be used for cervical cancer screening. The HPV test looks for the virus that can cause the cell changes on the cervix. The cells collected during the Pap test can be tested for HPV. The HPV test could be used to screen women aged 44 years and older, and should be used in women of any age  who have unclear Pap test results. After the age of 55, women should have HPV testing at the same frequency as a Pap test.  Colorectal cancer can be detected and often prevented. Most routine colorectal cancer screening begins at the age of 44 and continues through age 20. However, your caregiver may recommend screening at an earlier age if you have risk factors for colon cancer. On a yearly basis, your caregiver may provide home test kits to check for hidden blood in the stool. Use of a small camera at the end of a tube, to directly examine the colon (sigmoidoscopy or colonoscopy), can detect the earliest forms of colorectal cancer. Talk to your caregiver about this at age 86, when routine screening begins. Direct examination of the colon should be repeated every 5 to 10 years through age 13, unless early forms of pre-cancerous polyps or small growths are found.  Hepatitis C blood testing is recommended for all people born from [birth year] through 1964 and any  individual with known risks for hepatitis C.  Practice safe sex. Use condoms and avoid high-risk sexual practices to reduce the spread of sexually transmitted infections (STIs). Sexually active women aged 36 and younger should be checked for Chlamydia, which is a common sexually transmitted infection. Older women with new or multiple partners should also be tested for Chlamydia. Testing for other STIs is recommended if you are sexually active and at increased risk.  Osteoporosis is a disease in which the bones lose minerals and strength with aging. This can result in serious bone fractures. The risk of osteoporosis can be identified using a bone density scan. Women ages 20 and over and women at risk for fractures or osteoporosis should discuss screening with their caregivers. Ask your caregiver whether you should be taking a calcium supplement or vitamin D to reduce the rate of osteoporosis.  Menopause can be associated with physical symptoms and risks. Hormone replacement therapy is available to decrease symptoms and risks. You should talk to your caregiver about whether hormone replacement therapy is right for you.  Use sunscreen. Apply sunscreen liberally and repeatedly throughout the day. You should seek shade when your shadow is shorter than you. Protect yourself by wearing long sleeves, pants, a wide-brimmed hat, and sunglasses year round, whenever you are outdoors.  Notify your caregiver of new moles or changes in moles, especially if there is a change in shape or color. Also notify your caregiver if a mole is larger than the size of a pencil eraser.  Stay current with your immunizations. Document Released: 05/28/2011 Document Revised: 03/09/2013 Document Reviewed: 05/28/2011 [facility] Patient Information 2014 [employer].

## 2015-04-08 LAB — URINALYSIS W MICROSCOPIC + REFLEX CULTURE
Bacteria, UA: NONE SEEN
Bilirubin Urine: NEGATIVE
Casts: NONE SEEN
Crystals: NONE SEEN
Glucose, UA: NEGATIVE mg/dL
Hgb urine dipstick: NEGATIVE
Ketones, ur: NEGATIVE mg/dL
Leukocytes, UA: NEGATIVE
Nitrite: NEGATIVE
Protein, ur: NEGATIVE mg/dL
Specific Gravity, Urine: 1.027 (ref 1.005–1.030)
Squamous Epithelial / LPF: NONE SEEN
Urobilinogen, UA: 0.2 mg/dL (ref 0.0–1.0)
pH: 5.5 (ref 5.0–8.0)

## 2015-04-08 LAB — CYTOLOGY - PAP

## 2015-05-03 ENCOUNTER — Encounter: Payer: Self-pay | Admitting: Family Medicine

## 2015-05-16 ENCOUNTER — Ambulatory Visit (INDEPENDENT_AMBULATORY_CARE_PROVIDER_SITE_OTHER): Payer: 59 | Admitting: Family Medicine

## 2015-05-16 ENCOUNTER — Encounter: Payer: Self-pay | Admitting: Family Medicine

## 2015-05-16 VITALS — BP 118/78 | HR 92 | Temp 98.3°F | Ht 62.0 in | Wt 207.6 lb

## 2015-05-16 DIAGNOSIS — I10 Essential (primary) hypertension: Secondary | ICD-10-CM

## 2015-05-16 DIAGNOSIS — M25562 Pain in left knee: Secondary | ICD-10-CM

## 2015-05-16 DIAGNOSIS — G43909 Migraine, unspecified, not intractable, without status migrainosus: Secondary | ICD-10-CM

## 2015-05-16 DIAGNOSIS — G629 Polyneuropathy, unspecified: Secondary | ICD-10-CM | POA: Diagnosis not present

## 2015-05-16 DIAGNOSIS — E785 Hyperlipidemia, unspecified: Secondary | ICD-10-CM

## 2015-05-16 DIAGNOSIS — J309 Allergic rhinitis, unspecified: Secondary | ICD-10-CM | POA: Diagnosis not present

## 2015-05-16 DIAGNOSIS — M797 Fibromyalgia: Secondary | ICD-10-CM | POA: Diagnosis not present

## 2015-05-16 DIAGNOSIS — M25561 Pain in right knee: Secondary | ICD-10-CM

## 2015-05-16 DIAGNOSIS — IMO0001 Reserved for inherently not codable concepts without codable children: Secondary | ICD-10-CM

## 2015-05-16 DIAGNOSIS — K3184 Gastroparesis: Secondary | ICD-10-CM

## 2015-05-16 DIAGNOSIS — J069 Acute upper respiratory infection, unspecified: Secondary | ICD-10-CM

## 2015-05-16 DIAGNOSIS — R7301 Impaired fasting glucose: Secondary | ICD-10-CM | POA: Diagnosis not present

## 2015-05-16 DIAGNOSIS — G2581 Restless legs syndrome: Secondary | ICD-10-CM

## 2015-05-16 LAB — POCT GLYCOSYLATED HEMOGLOBIN (HGB A1C): Hemoglobin A1C: 6

## 2015-05-16 LAB — LIPID PANEL
Cholesterol: 162 mg/dL (ref 0–200)
HDL: 57 mg/dL (ref >=46)
LDL Cholesterol: 77 mg/dL (ref 0–99)
Total CHOL/HDL Ratio: 2.8 Ratio
Triglycerides: 142 mg/dL (ref <150)
VLDL: 28 mg/dL (ref 0–40)

## 2015-05-16 MED ORDER — FLUTICASONE PROPIONATE 50 MCG/ACT NA SUSP: 2.0000 | Freq: Every day | NASAL | Status: DC

## 2015-05-16 NOTE — Patient Instructions (Signed)
We will be referring you to cardiology for clearance prior to having your knee replacement. Continue to use Mucinex to help loosen the phlegm for your cough/cold.  Do NOT use Robitussin along with it. Let us know if you develop fever, and if mucus remains discolored/worsens after another 5-7 days.  Continue your current medications.

## 2015-05-16 NOTE — Progress Notes (Signed)
Chief Complaint  Patient presents with  . Hypertension    fasting med check(had some gatorade). Also has medical clearance form for knee surgery in October that needs to be filled out.   . Cough    started Saturday with cough and congestion, no fevers. Mucus is clear. Feels lightheaded when she gets up and gets moving, thinks just because she feels weak. Having cold sweats.    IFG--patient presents today to follow up on pre-diabetes. Her A1c in September had crept up to 6.4, down to 5.5 in December, and now up to 6. She is trying to be careful with her diet, but she is spending a lot of time with her dad (on palliative care for CHF--currently at [facility], may be moved to [facility]).  Other than walking back and forth there, she hasn't been getting regular exercise (also related to knee pain).  She previously had been walking 30 mins/day.  It is suspected that patient has hypertension. She previously reported that she has been on diuretics for fluid retention, and beta blockers for tremor, never diagnosed with HTN. However, she has also found that if she skipped a day of the diuretic, BP went up to 160/90 on those days (along with recurrent swelling)--so she went back to taking medication daily. She periodically checks her BP at home, running 120's/70's.  She denies side effects or muscle cramping.  Allergies: doing pretty well. Using Flonase and zyrtec every day.  OSA--on CPAP,tolerating well, not using it when congested/sick.  2 days ago she started coughing, had a tickle in her throat.  Sometimes she gets up some clear phlegm (started getting it up yesterday).  She has runny nose, clear. No sinus headaches, fevers, +chills/sweats.  Exposed to sick contact (father has been coughing, she has been staying with him a lot while at [facility]).  She has been taking Mucinex.  Cough is better today than yesterday.  Has also been taking some Robitussin.  Feeling a little lightheaded  today.  Hyperlipidemia follow-up:  Patient is trying to follow a low-fat, low cholesterol diet as best she can.  Compliant with medications and denies medication side effects  Migraines:  Hasn't had any recently.  Imitrex works well--hasn't needed one in a while. Takes topamax for prevention.  Tremor--on beta blocker RLS--on klonopin Neuropathy in her feet--only tolerates lyrica at 75mg  BID (dose increased by Dr. Valaria Good, but decreased again by Dr. Estanislado Pandy)  Gastroparesis:  Stable overall, no major flares in the last 5-6 months.  No longer vomiting daily; gags/heaves more often, but also not daily.  She did have some vomiting last week.  Fibromyalgia: treated by Dr. Estanislado Pandy. Pain averages 3-4/10 on pain scale overall. Improved overall.  Right knee pain--plans to have TKR by Dr. Wynelle Link in October (although date may be changed depending on father's health).  They have faxed over a form for clearance.  PMH, PSH, SH reviewed Family History  Problem Relation Age of Onset  . Allergies Mother   . Hypertension Mother   . Heart disease Mother     possible valve problem  . Heart disease Father     pacemaker, CHF  . Hypertension Father   . Diabetes Father     borderline  . Stroke Father   . Kidney disease Father   . Heart disease Paternal Grandmother   . Heart disease Paternal Grandfather   . Asthma Sister   . Irritable bowel syndrome Sister   . Allergies Sister    Outpatient Encounter Prescriptions  as of 05/16/2015  Medication Sig Note  . ALPRAZolam (XANAX) 0.25 MG tablet Take 0.25 mg by mouth 2 (two) times daily as needed. For anxiety   . ARIPiprazole (ABILIFY) 2 MG tablet Take 4 mg by mouth every morning. 5 mg. 11/11/2014: Takes just 1 tablet daily (2mg )  . Armodafinil (NUVIGIL) 250 MG tablet Take 250 mg by mouth every morning.    Marland Kitchen atorvastatin (LIPITOR) 40 MG tablet TAKE 1 TABLET (40 MG TOTAL) BY MOUTH DAILY.   . cetirizine (ZYRTEC) 10 MG tablet Take 10 mg by mouth at bedtime.    . clonazePAM (KLONOPIN) 0.5 MG tablet TAKE 1/2 TO 1 TABLET EVERY DAY AS NEEDED   . dronabinol (MARINOL) 5 MG capsule Take 10 mg by mouth 2 (two) times daily before a meal.    . DULoxetine (CYMBALTA) 60 MG capsule Take 60 mg by mouth 2 (two) times daily.   . fluticasone (FLONASE) 50 MCG/ACT nasal spray Place 2 sprays into both nostrils daily.   . methocarbamol (ROBAXIN) 500 MG tablet Take 500 mg by mouth 2 (two) times daily as needed (muscle spasms). For pain 05/16/2015: Uses prn, about 5x/week  . nortriptyline (PAMELOR) 10 MG capsule Take 40 mg by mouth at bedtime.  05/16/2015: .  . ondansetron (ZOFRAN) 4 MG tablet Take 4 mg by mouth every 8 (eight) hours as needed (nausea).  03/29/2014: Prn nausea; uses occasionally  . pantoprazole (PROTONIX) 40 MG tablet Take 40 mg by mouth 2 (two) times daily.   . pregabalin (LYRICA) 50 MG capsule Take 50 mg by mouth 2 (two) times daily. 11/11/2014: Pt states she is on 75mg  twice daily.  Originally for fibromyalgia, but also for neuropathy.  Dose increased to 225mg  10/2014 by Dr. Valaria Good, but pt only taking 1 BID (75mg  dose)  . Probiotic Product (Ladue) CAPS Take 1 capsule by mouth daily.   . propranolol (INDERAL) 20 MG tablet Take 40 mg by mouth at bedtime.  12/24/2013: Takes 2 at bedtime for tremor, from neurologist  . topiramate (TOPAMAX) 50 MG tablet Take 50 mg by mouth at bedtime.  12/24/2013: For migraine prevention  . triamterene-hydrochlorothiazide (DYAZIDE) 37.5-25 MG per capsule TAKE ONE CAPSULE BY MOUTH EVERY DAY   . [DISCONTINUED] fluticasone (FLONASE) 50 MCG/ACT nasal spray Place 2 sprays into both nostrils daily.   Marland Kitchen acetaminophen (TYLENOL) 500 MG tablet Take 1,000 mg by mouth every 6 (six) hours as needed.   . promethazine (PHENERGAN) 25 MG suppository Place 25 mg rectally every 6 (six) hours as needed for nausea or vomiting.   . SUMAtriptan (IMITREX) 50 MG tablet TAKE 1 TABLET (50 MG TOTAL) BY MOUTH EVERY 2 (TWO) HOURS AS NEEDED FOR  MIGRAINE. MAX 200 MG/DAY (Patient not taking: Reported on 05/16/2015)   . [DISCONTINUED] docusate sodium (COLACE) 100 MG capsule Take 200 mg by mouth as needed for mild constipation.    No facility-administered encounter medications on file as of 05/16/2015.   Allergies  Allergen Reactions  . Erythromycin Nausea Only  . Tramadol Itching  . Dilaudid [Hydromorphone Hcl] Itching   ROS:  No fever, chills.  +URI symptoms as per HPI.  No recent migraines.  No chest pain or palpitations.  +cough.  +nausea/gagging, but no recent emesis.  Bowels are stable. No bleeding, bruising, rash.  Pain as per HPI.  Moods are stable. +lightheaded related to current illness and cough. No urinary complaints.  PHYSICAL EXAM: BP 118/78 mmHg  Pulse 92  Temp(Src) 98.3 F (36.8 C) (Tympanic)  Ht 5\' 2"  (1.575 m)  Wt 207 lb 9.6 oz (94.167 kg)  BMI 37.96 kg/m2  LMP 07/27/2006 Obese female, accompanied by her husband.  With occasional cough that turns into gagging episode, expectorating a small amount of white phlegm, minimal yellow discoloration. HEENT: PERRL ,EOMI, conjunctiva clear. TM's and EAC's normal.  Nasal mucosa is mild-mod edematous, slightly red with whitish mucus and crust L>R Sinuses nontender. OP clear Neck: no lymphadenopathy, thyromegaly or carotid bruit Heart: regular rate and rhythm without murmur Lungs: clear bilaterally Abdomen: soft, normal bowel sounds. mildy tender in epigastrium and lower abdomen. Extremities: no edema Neuro: alert and oriented.  Normal strength, gait  A1c 6.0 (up from 5.5 in December).  Labs reviewed as ordered by Dr. Estanislado Pandy in May--WBC 13.5, remainder of chem and vitamin D okay.  ASSESSMENT/PLAN:  IFG (impaired fasting glucose) - diet reviewed; encouraged daily exercise and weight loss - Plan: HgB A1c  Allergic rhinitis, unspecified allergic rhinitis type - controlled. Currently also has URI - Plan: fluticasone (FLONASE) 50 MCG/ACT nasal spray  Hyperlipidemia -  Plan: Lipid panel  Acute upper respiratory infection - reviewed supportive measures, and s/sx of bacterial infection.  continue guaifenesin.  Gastroparesis - stable  Migraine without status migrainosus, not intractable, unspecified migraine type - controlled on current regimen  Fibromyalgia - stable; under care of Dr. Estanislado Pandy  Obesity, Class II, BMI 35-39.9, with comorbidity - counseled re: diet, exercise, weight, risks  RLS (restless legs syndrome) - stable  Peripheral neuropathy - stable; under care of Dr. Valaria Good  Knee pain, bilateral - Plan: Ambulatory referral to Cardiology  Essential hypertension - controlled on current regimen - Plan: Ambulatory referral to Cardiology   Refer for Cardiology clearance--probably HTN, prediabetes, OSA and FH heart disease.  (Had gatorade 2 hours ago when she took meds--noted for when reviewing labs)  Do not take both mucinex and robitussin--they have same/similar ingredients, choose one or the other. Let us know if your mucus becomes thick and discolored, or if you start having fevers.

## 2015-05-17 ENCOUNTER — Encounter: Payer: Self-pay | Admitting: Family Medicine

## 2015-05-17 DIAGNOSIS — E669 Obesity, unspecified: Secondary | ICD-10-CM | POA: Insufficient documentation

## 2015-05-20 ENCOUNTER — Telehealth: Payer: Self-pay | Admitting: Family Medicine

## 2015-05-20 MED ORDER — AMOXICILLIN 500 MG PO TABS: 1000.0000 mg | ORAL_TABLET | Freq: Two times a day (BID) | ORAL | Status: DC

## 2015-05-20 NOTE — Telephone Encounter (Signed)
Advise pt rx sent to her pharmacy

## 2015-05-20 NOTE — Telephone Encounter (Signed)
Called pt to advise that med sent to pharmacy

## 2015-05-20 NOTE — Telephone Encounter (Signed)
Pt is not feeling better. Yellow to Yellow-greenish mucus is coming out of nose now when she blows. She says Dr Tomi Bamberger asked her to call if she got worse or did not see improvement by the end of the week. Requesting antibiotic.

## 2015-06-14 ENCOUNTER — Telehealth: Payer: Self-pay | Admitting: *Deleted

## 2015-06-14 MED ORDER — PROGESTERONE MICRONIZED 100 MG PO CAPS: 100.0000 mg | ORAL_CAPSULE | Freq: Every day | ORAL | Status: DC

## 2015-06-14 MED ORDER — ESTRADIOL 0.05 MG/24HR TD PTTW: 1.0000 | MEDICATED_PATCH | TRANSDERMAL | Status: DC

## 2015-06-14 NOTE — Telephone Encounter (Signed)
Pt aware you are out of the office) pt has been off HRT x 1 year now, would like to restart c/o increased hot flashes and night sweats. Pt was taking Vivelle 0.05 mg patches and Prometrium 100 mg nightly asked if she could have a same Rx again? Please advise

## 2015-06-14 NOTE — Telephone Encounter (Signed)
Pt informed Rx sent. 

## 2015-06-14 NOTE — Telephone Encounter (Signed)
Ok to restart with refill of both through next annual.  Remind patient of the risks to include higher risk of blood clots and breast cancer that we have discussed before.

## 2015-06-28 ENCOUNTER — Other Ambulatory Visit: Payer: Self-pay | Admitting: Family Medicine

## 2015-06-28 NOTE — Telephone Encounter (Signed)
Is this okay to refill? 

## 2015-06-28 NOTE — Telephone Encounter (Signed)
Call out 30 day supply Clonazepam

## 2015-06-29 NOTE — Telephone Encounter (Signed)
I called out # 30 per Chana Bode PA

## 2015-07-24 NOTE — Progress Notes (Signed)
Patient ID: [name], female   DOB: 1956/03/16, 59 y.o.   MRN: [id number]     Cardiology Office Note   Date:  07/25/2015   ID:  [name], DOB 04-28-1956, MRN [id number]  PCP:  Vikki Ports, MD  Cardiologist:   Jenkins Rouge, MD   No chief complaint on file.     History of Present Illness: [name] is a 59 y.o. female who presents for preop evaluation.  Right TKR schedule with Dr Maureen Ralphs 09/05/15. Overweight, postmenapausal on HRT and glucose intolerant elevated lipids on statin .  OSA on CPAP since 2009 Had arthroscopic surgery on right Knee in 2009 with Dr Daine Gravel about her dad who is at [facility] with recent stroke/hospice.  She is sedentary.  Has always had a high HR and takes Inderal 40 mg at night.  No recent TSH/T4 She thinks she will put her knee surgery off until her dad passes.  Has chronic fatigue and fibromyalgia Rheum Deveschwar has had her on nuwvigil for years    Past Medical History  Diagnosis Date  . Fibromyalgia   . GERD (gastroesophageal reflux disease)   . Obstructive sleep apnea     On Cpap 2009  . Anxiety   . Depression   . Restless leg syndrome   . Edema   . Migraine   . Hyperlipidemia   . Anemia     previously followed by Dr. Jamse Arn for anemia and elevated platelets  . Paresthesia     Dr. Everette Rank at [facility]  . Tremor     Dr. Everette Rank  . Gastroparesis     followed at [facility]  . Pneumonia     2012  . Plantar fasciitis 02/2011    R foot  . S/P endometrial ablation 08/09/2006    Novasure Ablation  . C. difficile colitis 10/01/12    treated by [facility]  . Dyssynergia     dyssynergenic defecation, contributing to fecal incontinence.  . Kidney stone   . Pelvic floor dysfunction     pelvic floor dyssynergy  . Neuropathy   . DDD (degenerative disc disease), lumbar 08/19/14    and facet arthroplasty & left lumbar radiculopathy (Dr.Ramos)  . S/P epidural steroid injection 09/20/14    Dr.Ramos    Past Surgical History    Procedure Laterality Date  . Tonsillectomy  1968  . Knee surgery  1999    R knee, Dr. Eddie Dibbles, torn cartilage  . Cholecystectomy  9/05  . Endometrial ablation  08/09/2006    Dr. Valentina Shaggy Ablation     Current Outpatient Prescriptions  Medication Sig Dispense Refill  . acetaminophen (TYLENOL) 500 MG tablet Take 1,000 mg by mouth every 6 (six) hours as needed for mild pain.     Marland Kitchen ALPRAZolam (XANAX) 0.25 MG tablet Take 0.25 mg by mouth 2 (two) times daily as needed. For anxiety    . ARIPiprazole (ABILIFY) 5 MG tablet Take 1-3 tablets by mouth daily as needed. Nausea & anxiety  3  . Armodafinil (NUVIGIL) 250 MG tablet Take 250 mg by mouth every morning.     Marland Kitchen atorvastatin (LIPITOR) 40 MG tablet TAKE 1 TABLET (40 MG TOTAL) BY MOUTH DAILY. 90 tablet 1  . cetirizine (ZYRTEC) 10 MG tablet Take 10 mg by mouth at bedtime.    . clonazePAM (KLONOPIN) 0.5 MG tablet Take 1/2 to 1 tablet by mouth daily as needed for anixety.    Marland Kitchen dronabinol (MARINOL) 5 MG capsule Take 10 mg  by mouth 2 (two) times daily before a meal.     . DULoxetine (CYMBALTA) 60 MG capsule Take 60 mg by mouth 2 (two) times daily.    Marland Kitchen estradiol (VIVELLE-DOT) 0.05 MG/24HR patch Place 1 patch (0.05 mg total) onto the skin 2 (two) times a week. 8 patch 9  . fluticasone (FLONASE) 50 MCG/ACT nasal spray Place 2 sprays into both nostrils daily. 16 g 11  . LYRICA 75 MG capsule Take 75 mg by mouth 2 (two) times daily.  0  . methocarbamol (ROBAXIN) 500 MG tablet Take 500 mg by mouth 2 (two) times daily as needed (muscle spasms). For pain    . nortriptyline (PAMELOR) 50 MG capsule Take 3-4 capsules by mouth at bedtime.  1  . ondansetron (ZOFRAN) 4 MG tablet Take 4 mg by mouth every 8 (eight) hours as needed for nausea.     . pantoprazole (PROTONIX) 40 MG tablet Take 40 mg by mouth 2 (two) times daily.    . pregabalin (LYRICA) 50 MG capsule Take 50 mg by mouth 2 (two) times daily.    . Probiotic Product ([place]) CAPS Take  1 capsule by mouth daily.    . progesterone (PROMETRIUM) 100 MG capsule Take 1 capsule (100 mg total) by mouth daily. 30 capsule 9  . promethazine (PHENERGAN) 25 MG suppository Place 25 mg rectally every 6 (six) hours as needed for nausea or vomiting.    . propranolol (INDERAL) 20 MG tablet Take 40 mg by mouth at bedtime.     . topiramate (TOPAMAX) 50 MG tablet Take 50 mg by mouth at bedtime.     . triamterene-hydrochlorothiazide (DYAZIDE) 37.5-25 MG per capsule TAKE ONE CAPSULE BY MOUTH EVERY DAY 90 capsule 0  . [DISCONTINUED] carbamazepine (TEGRETOL XR) 100 MG 12 hr tablet Take 200 mg by mouth daily.      . [DISCONTINUED] Eszopiclone 3 MG TABS Take 3 mg by mouth at bedtime. Take immediately before bedtime     No current facility-administered medications for this visit.    Allergies:   Erythromycin; Tramadol; and Dilaudid    Social History:  The patient  reports that she has never smoked. She has never used smokeless tobacco. She reports that she does not drink alcohol or use illicit drugs.   Family History:  The patient's family history includes Allergies in her mother and sister; Asthma in her sister; Diabetes in her father; Heart disease in her father, mother, paternal grandfather, and paternal grandmother; Hypertension in her father and mother; Irritable bowel syndrome in her sister; Kidney disease in her father; Stroke in her father.    ROS:  Please see the history of present illness.   Otherwise, review of systems are positive for none.   All other systems are reviewed and negative.    PHYSICAL EXAM: VS:  BP 102/70 mmHg  Pulse 102  Ht '5\' 2"'  (1.575 m)  Wt 100.154 kg (220 lb 12.8 oz)  BMI 40.37 kg/m2  LMP 07/27/2006 , BMI Body mass index is 40.37 kg/(m^2). Affect appropriate Obese white female  HEENT: normal Neck supple with no adenopathy JVP normal no bruits no thyromegaly Lungs clear with no wheezing and good diaphragmatic motion Heart:  S1/S2 no murmur, no rub, gallop or  click PMI normal Abdomen: benighn, BS positve, no tenderness, no AAA no bruit.  No HSM or HJR Distal pulses intact with no bruits No edema Neuro non-focal Skin warm and dry No muscular weakness    EKG:  02/19/13  ST rate 119 poor R wave progression  07/25/15  SR rate 102  LAFB  Possible anterior MY   Recent Labs: 08/12/2014: ALT 22; BUN 19; Creat 0.73; Potassium 4.0; Sodium 140    Lipid Panel    Component Value Date/Time   CHOL 162 05/16/2015 0001   TRIG 142 05/16/2015 0001   HDL 57 05/16/2015 0001   CHOLHDL 2.8 05/16/2015 0001   VLDL 28 05/16/2015 0001   LDLCALC 77 05/16/2015 0001      Wt Readings from Last 3 Encounters:  07/25/15 100.154 kg (220 lb 12.8 oz)  05/16/15 94.167 kg (207 lb 9.6 oz)  04/07/15 96.616 kg (213 lb)      Other studies Reviewed: Additional studies/ records that were reviewed today include: Epic notes Primary notes and old ECG;s .    ASSESSMENT AND PLAN:  1.  Preop:  No cardiac history she indicates normal cath over a decade ago.  ECG abnormal ? Anterior MI although she is large breasted Given tachycardia favor lexiscan myovue and echo to assess EF and r/o CAD.   2. Tachycardia  Check labs including Hct/TSH  Start lopressor 50 bid  Echo and myovue as above 3. OSA:  Discussed weight loss continue CPAP 4. ADD:  Rockie Neighbours may be driving her HR  May need to stop    Current medicines are reviewed at length with the patient today.  The patient does not have concerns regarding medicines.  The following changes have been made:  lopresser 50 bifd  Labs/ tests ordered today include:  TSH/HCT ESR  No orders of the defined types were placed in this encounter.     Disposition:   FU with me after tests      Signed, Jenkins Rouge, MD  07/25/2015 11:15 AM    [facility] [address], [place], [place]  [address] Phone: [phone]; Fax: [phone]

## 2015-07-25 ENCOUNTER — Encounter: Payer: Self-pay | Admitting: Cardiovascular Disease

## 2015-07-25 ENCOUNTER — Ambulatory Visit (INDEPENDENT_AMBULATORY_CARE_PROVIDER_SITE_OTHER): Payer: 59 | Admitting: Cardiovascular Disease

## 2015-07-25 VITALS — BP 102/70 | HR 102 | Ht 62.0 in | Wt 220.8 lb

## 2015-07-25 DIAGNOSIS — I10 Essential (primary) hypertension: Secondary | ICD-10-CM | POA: Diagnosis not present

## 2015-07-25 DIAGNOSIS — R0789 Other chest pain: Secondary | ICD-10-CM

## 2015-07-25 DIAGNOSIS — R0600 Dyspnea, unspecified: Secondary | ICD-10-CM | POA: Diagnosis not present

## 2015-07-25 LAB — T4, FREE: Free T4: 0.93 ng/dL (ref 0.60–1.60)

## 2015-07-25 LAB — HEMATOCRIT: HCT: 38.8 % (ref 36.0–46.0)

## 2015-07-25 LAB — TSH: TSH: 2.24 u[IU]/mL (ref 0.35–4.50)

## 2015-07-25 LAB — HEMOGLOBIN: Hemoglobin: 12.7 g/dL (ref 12.0–15.0)

## 2015-07-25 LAB — SEDIMENTATION RATE: Sed Rate: 67 mm/hr — ABNORMAL HIGH (ref 0–22)

## 2015-07-25 MED ORDER — METOPROLOL TARTRATE 50 MG PO TABS: 50.0000 mg | ORAL_TABLET | Freq: Two times a day (BID) | ORAL | Status: DC

## 2015-07-25 NOTE — Patient Instructions (Signed)
Medication Instructions:  STOP  PROPANOLOL (INDERAL) START METOPROLOL  50 MG  1 TAB  TWICE DAILY Labwork: HGB  HCT  SED RATE  TSH  T4  TODAY  Testing/Procedures: Your physician has requested that you have an echocardiogram. Echocardiography is a painless test that uses sound waves to create images of your heart. It provides your doctor with information about the size and shape of your heart and how well your heart's chambers and valves are working. This procedure takes approximately one hour. There are no restrictions for this procedure.   Your physician has requested that you have a lexiscan myoview. For further information please visit [email or web address]. Please follow instruction sheet, as given.  Follow-Up: Your physician recommends that you schedule a follow-up appointment in:  AFTER TEST  ARE DONE WITH  DR Johnsie Cancel   Any Other Special Instructions Will Be Listed Below (If Applicable).

## 2015-07-26 ENCOUNTER — Telehealth (HOSPITAL_COMMUNITY): Payer: Self-pay

## 2015-07-26 NOTE — Telephone Encounter (Signed)
Patient given detailed instructions per Myocardial Perfusion Study Information Sheet for test on 07-28-2015 at 0830. Patient Notified to arrive 15 minutes early, and that it is imperative to arrive on time for appointment to keep from having the test rescheduled. Patient verbalized understanding. [name], Tranice Laduke A

## 2015-07-27 ENCOUNTER — Encounter: Payer: Self-pay | Admitting: Family Medicine

## 2015-07-27 ENCOUNTER — Ambulatory Visit (INDEPENDENT_AMBULATORY_CARE_PROVIDER_SITE_OTHER): Payer: 59 | Admitting: Family Medicine

## 2015-07-27 VITALS — BP 124/80 | HR 88 | Ht 62.0 in | Wt 221.6 lb

## 2015-07-27 DIAGNOSIS — Z23 Encounter for immunization: Secondary | ICD-10-CM | POA: Diagnosis not present

## 2015-07-27 DIAGNOSIS — R700 Elevated erythrocyte sedimentation rate: Secondary | ICD-10-CM

## 2015-07-27 NOTE — Patient Instructions (Signed)
We have done the labs that were recommended by Dr. Estanislado Pandy (based upon our conversation today, with your sed rate results), and will send her the results when available (these take a little longer than routine labs). Your sed rate is a little higher than it was 10 years ago--I suspect it is related to the arthritis and inflammation, but these tests will help evaluate for other causes.

## 2015-07-27 NOTE — Progress Notes (Signed)
Chief Complaint  Patient presents with  . Follow-up    following up on labs done by Dr.Nishan-particularly her ESR.   She was referred to follow up with [place] today to review and discuss abnormal sed rate, done as part of labwork by cardiologist (seen for pre-op evaluation).  He was concerned about possibility of PMR due to elevated sed rate.  She denies significant joint stiffness, weakness.  She has trouble getting out of a chair related to knee pain, not hip strength/pain.  She has some pain in her low back which radiates to the hip. She has seen Dr. Nelva Bush and had a cortisone injection (but apparently insurance didn't cover a second shot (?)).  She is having some more pain in her back/shoulder--she thinks it is related to sitting more in the nursing home (visiting her father), and to her fibromyalgia.  She has testing scheduled for tomorrow with cardiologist (chemical stress test), as part of clearance for knee surgery.   She has occasional headaches. Denies vision changes/loss, jaw pain. She has arthritis in her knees--having pain and swelling in the right knee.. She also has chronic pain in her back, related to fibromyalgia.  She is followed by Dr. Estanislado Pandy.  PMH, PSH, SH reviewed. Meds and allergies reviewed.  ROS: no fever, chills, headaches (just occasional, mild), dizziness, chest pain, shortness of breath, URI symptoms, cough, bleeding, bruising, rash. See HPI.  PHYSICAL EXAM: BP 124/80 mmHg  Pulse 88  Ht '5\' 2"'  (1.575 m)  Wt 221 lb 9.6 oz (100.517 kg)  BMI 40.52 kg/m2  LMP 07/27/2006  Well developed, pleasant, obese female in no distress.  In good spirits.  Some swelling to the right knee.  No warmth or erythema. Strength is normal in upper and lower extremities--hip flexors, dorsi/plantarflexion of ankles. Normal at shoulders, fingers, wrists. Skin: no rashes or bruising Psych: normal mood, affect, hygiene and grooming  ESR 67 on 07/25/15  Phone call to Dr. Estanislado Pandy, who  reviewed her chart and prior labs: 2002 ESR 26 2006 ESR 54, had normal SPEP and other labs  ASSESSMENT/PLAN:  Elevated sed rate - Plan: ANA, Cyclic Citrul Peptide Antibody, IGG, CANCELED: SPEP & IFE with QIG  Need for influenza vaccination - Plan: Flu Vaccine QUAD 36+ mos PF IM (Fluarix & Fluzone Quad PF)  Per conversation with Dr. Estanislado Pandy, this patient's rheumatologist since 2002, she recommends checking additional labs as part of evaluation of elevated sed rate.  Order SPEP and immunoglobulins, ANA, anti-CCP (more sensitive for RA than RF)  Send copies of labs to Dr. Estanislado Pandy (ESR and these labs ordered today).  Flu shot given

## 2015-07-28 ENCOUNTER — Encounter (HOSPITAL_COMMUNITY): Payer: 59

## 2015-07-28 ENCOUNTER — Ambulatory Visit (HOSPITAL_COMMUNITY): Payer: 59 | Attending: Cardiology

## 2015-07-28 VITALS — Ht 62.0 in | Wt 220.0 lb

## 2015-07-28 DIAGNOSIS — R002 Palpitations: Secondary | ICD-10-CM | POA: Insufficient documentation

## 2015-07-28 DIAGNOSIS — R0789 Other chest pain: Secondary | ICD-10-CM | POA: Diagnosis not present

## 2015-07-28 DIAGNOSIS — I10 Essential (primary) hypertension: Secondary | ICD-10-CM | POA: Insufficient documentation

## 2015-07-28 DIAGNOSIS — G4440 Drug-induced headache, not elsewhere classified, not intractable: Secondary | ICD-10-CM

## 2015-07-28 DIAGNOSIS — R0600 Dyspnea, unspecified: Secondary | ICD-10-CM | POA: Diagnosis not present

## 2015-07-28 DIAGNOSIS — R110 Nausea: Secondary | ICD-10-CM

## 2015-07-28 LAB — CYCLIC CITRUL PEPTIDE ANTIBODY, IGG: Cyclic Citrullin Peptide Ab: <2 U/mL (ref 0.0–5.0)

## 2015-07-28 LAB — IGG, IGA, IGM
IgA: 286 mg/dL (ref 69–380)
IgG (Immunoglobin G), Serum: 1150 mg/dL (ref 690–1700)
IgM, Serum: 144 mg/dL (ref 52–322)

## 2015-07-28 LAB — ANA: Anti Nuclear Antibody(ANA): NEGATIVE

## 2015-07-28 MED ORDER — AMINOPHYLLINE 25 MG/ML IV SOLN
75.0000 mg | Freq: Once | INTRAVENOUS | Status: AC
  Administered 2015-07-28: 75 mg via INTRAVENOUS

## 2015-07-28 MED ORDER — TECHNETIUM TC 99M SESTAMIBI GENERIC - CARDIOLITE
32.7000 | Freq: Once | INTRAVENOUS | Status: AC | PRN
  Administered 2015-07-28: 32.7 via INTRAVENOUS

## 2015-07-28 MED ORDER — REGADENOSON 0.4 MG/5ML IV SOLN
0.4000 mg | Freq: Once | INTRAVENOUS | Status: AC
  Administered 2015-07-28: 0.4 mg via INTRAVENOUS

## 2015-07-29 ENCOUNTER — Ambulatory Visit (HOSPITAL_COMMUNITY): Payer: 59 | Attending: Cardiology

## 2015-07-29 LAB — MYOCARDIAL PERFUSION IMAGING
LV dias vol: 63 mL
LV sys vol: 14 mL
Peak HR: 114 {beats}/min
RATE: 0.31
Rest HR: 91 {beats}/min
SDS: 4
SRS: 0
SSS: 4
TID: 1.02

## 2015-07-29 LAB — PROTEIN ELECTROPHORESIS, SERUM
Albumin ELP: 3.4 g/dL — ABNORMAL LOW (ref 3.8–4.8)
Alpha-1-Globulin: 0.3 g/dL (ref 0.2–0.3)
Alpha-2-Globulin: 0.8 g/dL (ref 0.5–0.9)
Beta 2: 0.5 g/dL (ref 0.2–0.5)
Beta Globulin: 0.5 g/dL (ref 0.4–0.6)
Gamma Globulin: 1.1 g/dL (ref 0.8–1.7)
Total Protein, Serum Electrophoresis: 6.5 g/dL (ref 6.1–8.1)

## 2015-07-29 MED ORDER — TECHNETIUM TC 99M SESTAMIBI GENERIC - CARDIOLITE
31.3000 | Freq: Once | INTRAVENOUS | Status: AC | PRN
  Administered 2015-07-29: 31.3 via INTRAVENOUS

## 2015-08-04 ENCOUNTER — Telehealth: Payer: Self-pay | Admitting: Cardiovascular Disease

## 2015-08-04 NOTE — Telephone Encounter (Signed)
LMTCB ./CY 

## 2015-08-04 NOTE — Telephone Encounter (Signed)
New Message   Pt is calling to have RN return her call   She states she is calling because Altha Harm called her

## 2015-08-08 ENCOUNTER — Other Ambulatory Visit (HOSPITAL_COMMUNITY): Payer: 59

## 2015-08-09 ENCOUNTER — Ambulatory Visit: Payer: Self-pay | Admitting: Orthopedic Surgery

## 2015-08-09 NOTE — Telephone Encounter (Signed)
Attempted to contact pt about her stress test results.  Phone rang several times then sounded like a fax machine.  Will attempt to contact again

## 2015-08-09 NOTE — Progress Notes (Signed)
Preoperative surgical orders have been place into the Epic hospital system for [name] on 08/09/2015, 12:36 PM  by Mickel Crow for surgery on 09-05-2015.  Preop Total Knee orders including Experal, IV Tylenol, and IV Decadron as long as there are no contraindications to the above medications. Arlee Muslim, PA-C

## 2015-08-11 NOTE — Telephone Encounter (Signed)
PT AWARE OF MYOVIEW RESULTS./CY 

## 2015-08-16 ENCOUNTER — Ambulatory Visit: Payer: Self-pay | Admitting: Orthopedic Surgery

## 2015-08-16 NOTE — H&P (Signed)
[name] DOB: 23-Jul-1956 Married / Language: English / Race: White Female Date of Admission:  09/05/2015 CC:  Right knee pain History of Present Illness The patient is a 59 year old female who comes in for a preoperative History and Physical. The patient is scheduled for a right total knee arthroplasty to be performed by Dr. Dione Plover. Aluisio, MD at [facility] on 09/05/2015. The patient is a 59 year old female who presented for follow up of their knee. The patient is being followed for their bilateral knee pain and osteoarthritis. Symptoms reported include: pain. Note for "Follow-up Knee": She states that the right knee continues to hurt. It is painful upon first standing after being off of it for awhile. She continues to take aleve or tylenol prn. She does not feel that the visco series helped any. The left knee is painful as well and she believes that the cortisone did not help either. Unfortunately, her right knee has gotten progressively worse over the time. It is now limiting her as to what she can and cannot do. The viscosupplement did not help her knee at all. Cortisone has helped some in the past. It has not lasted more than a few weeks though. She has consistent pain and swelling. She does state now that she feels as though she needs to do something else with the knee. She is ready to proceed with surgery.  They have been treated conservatively in the past for the above stated problem and despite conservative measures, they continue to have progressive pain and severe functional limitations and dysfunction. They have failed non-operative management including home exercise, medications, and injections. It is felt that they would benefit from undergoing total joint replacement. Risks and benefits of the procedure have been discussed with the patient and they elect to proceed with surgery. There are no active contraindications to surgery such as ongoing infection or rapidly  progressive neurological disease.  Problem List/Past Medical Right knee pain (M25.561) Lumbar pain (M54.5) Primary osteoarthritis of right knee (M17.11) Degenerative lumbar disc (M51.36) Lumbar radiculopathy (M54.16) Depression Anemia Gastroesophageal Reflux Disease Chronic fatigue syndrome Fibromyalgia Anxiety Disorder Hypercholesterolemia Kidney Stone Sleep Apnea CPAP Peripheral Neuropathy Migraine Headache  Allergies No Known Drug Allergies  Intolerance Erythromycin *MACROLIDES* Stomach pain and GI upset  Family History  Severe allergy sister Heart Disease mother, father, grandmother mothers side, grandmother fathers side and grandfather fathers side Hypertension mother and father Father Deceased, Congestive Heart Failure. age 39 Mother Living, Arthritis, Hypertension. age 63  Social History  Children 2 Current work status disabled Exercise Exercises weekly; does running / walking Alcohol use never consumed alcohol Pain Contract no Illicit drug use no Living situation live with spouse Tobacco / smoke exposure no Tobacco use never smoker Drug/Alcohol Rehab (Previously) no Drug/Alcohol Rehab (Currently) no Marital status married Post-Surgical Plans Home following the Total Knee  Medication History SUMAtriptan Succinate (50MG  Tablet, Oral) Active. Methocarbamol (500MG  Tablet, Oral) Active. Ondansetron HCl (8MG  Tablet, Oral as needed) Active. Prochlorperazine Maleate (10MG  Tablet, Oral) Active. ICaps Plus (Oral) Active. Vivelle-Dot (0.05MG /24HR Patch TW, Transdermal) Active. Progesterone Micronized (100MG  Capsule, Oral) Active. Cymbalta Active. Pantoprazole Sodium Active. Lyrica Active. Abilify Active. ALPRAZolam (0.25MG  Tablet, Oral) Active. Aleve (220MG  Tablet, Oral) Active. Nuvigil (250MG  Tablet, Oral) Active. Nortriptyline HCl (50MG  Capsule, Oral) Active. Atorvastatin Calcium (40MG  Tablet, Oral)  Active. Propranolol HCl (20MG  Tablet, Oral) Active. Topiramate (25MG  Tablet, Oral) Active. Triamterene-HCTZ (37.5-25MG  Capsule, Oral) Active. ClonazePAM (0.5MG  Tablet, Oral) Active.  Past Surgical History Tonsillectomy Gallbladder Surgery  laporoscopic Arthroscopy of Knee right  Review of Systems General Present- Fatigue. Not Present- Chills, Fever, Memory Loss, Night Sweats, Weight Gain and Weight Loss. Skin Not Present- Eczema, Hives, Itching, Lesions and Rash. HEENT Not Present- Dentures, Double Vision, Headache, Hearing Loss, Tinnitus and Visual Loss. Respiratory Present- Shortness of breath with exertion. Not Present- Allergies, Chronic Cough, Coughing up blood and Shortness of breath at rest. Cardiovascular Not Present- Chest Pain, Difficulty Breathing Lying Down, Murmur, Palpitations, Racing/skipping heartbeats and Swelling. Gastrointestinal Present- Diarrhea and Nausea. Not Present- Abdominal Pain, Bloody Stool, Constipation, Difficulty Swallowing, Heartburn, Jaundice, Loss of appetitie and Vomiting. Female Genitourinary Present- Urinating at Night. Not Present- Blood in Urine, Discharge, Flank Pain, Incontinence, Painful Urination, Urgency, Urinary frequency, Urinary Retention and Weak urinary stream. Musculoskeletal Not Present- Back Pain, Joint Pain, Joint Swelling, Morning Stiffness, Muscle Pain, Muscle Weakness and Spasms. Neurological Not Present- Blackout spells, Difficulty with balance, Dizziness, Paralysis, Tremor and Weakness. Psychiatric Not Present- Insomnia.  Vitals Weight: 219 lb Height: 62in Weight was reported by patient. Height was reported by patient. Body Surface Area: 1.99 m Body Mass Index: 40.06 kg/m  Pulse: 92 (Regular)  BP: 118/78 (Sitting, Right Arm, Standard)  Physical Exam The physical exam findings are as follows: Note:Patient is a 59 year old female with continued right knee pain. Patient is accompanied today by her husband  Al.  General Mental Status -Alert, cooperative and good historian. General Appearance-pleasant, Not in acute distress. Orientation-Oriented X3. Build & Nutrition-Overweight, Well nourished and Well developed.  Head and Neck Head-normocephalic, atraumatic . Neck Global Assessment - supple, no bruit auscultated on the right, no bruit auscultated on the left.  Eye Vision-Wears contact lenses. Pupil - Bilateral-Regular and Round. Motion - Bilateral-EOMI.  Chest and Lung Exam Auscultation Breath sounds - clear at anterior chest wall and clear at posterior chest wall. Adventitious sounds - No Adventitious sounds.  Cardiovascular Auscultation Rhythm - Regular rate and rhythm. Heart Sounds - S1 WNL and S2 WNL. Murmurs & Other Heart Sounds - Auscultation of the heart reveals - No Murmurs.  Abdomen Inspection Contour - Generalized moderate distention. Palpation/Percussion Tenderness - Abdomen is non-tender to palpation. Rigidity (guarding) - Abdomen is soft. Auscultation Auscultation of the abdomen reveals - Bowel sounds normal.  Female Genitourinary Note: Not done, not pertinent to present illness   Musculoskeletal Note: On exam, a well-developed female in no distress. Her hips show normal motion, no discomfort. Left knee, no effusion. There is moderate crepitus with range of motion of the left knee with range being about 0 to 135. She does not have any tenderness or instability. Right knee has moderate-sized effusion, range 5 to 120, marked crepitus on range of motion, tenderness medial greater than lateral with no instability noted.  RADIOGRAPHS Recent radiographs from earlier this year, AP both knees and lateral and she still has some joint space medial and lateral on the right knee, but has bone on bone patellofemoral. Left knee also has bone on bone patellofemoral and intact medial and lateral joint space.  Assessment & Plan Primary osteoarthritis of right  knee (M17.11) Note:Surgical Plans: Right Total Knee Replacement  Disposition: Home  PCP: [facility], Dr. Rita Ohara  IV TXA  Anesthesia Issues: Postop Nausea in the past  Signed electronically by Alexzandrew Monika Salk, III PA-C

## 2015-08-22 NOTE — Patient Instructions (Addendum)
[name]  08/25/2015   Your procedure is scheduled on: Monday September 05, 2015   Report to [facility] Main  Entrance take [place]  elevators to 3rd floor to  [place] at 6:00 AM.  Call this number if you have problems the morning of surgery [phone]   Remember: ONLY 1 PERSON MAY GO WITH YOU TO SHORT STAY TO GET  READY MORNING OF [place].  Do not eat food or drink liquids :After Midnight.     Take these medicines the morning of surgery with A SIP OF WATER: Alprazolam (Xanax) if needed; Aripiprazole (Ability); Duloxetine (Cymbalta); May use Flonase if needed (bring with you day of surgery); Lyricia; Metoprolol; Pantoprazole (Protonix)                               You may not have any metal on your body including hair pins and              piercings  Do not wear jewelry, make-up, lotions, powders or perfumes, deodorant             Do not wear nail polish.  Do not shave  48 hours prior to surgery.                Do not bring valuables to the hospital. [place].  Contacts, dentures or bridgework may not be worn into surgery.  Leave suitcase in the car. After surgery it may be brought to your room.              Please read over the following fact sheets you were given:MRSA INFORMATION SHEET; INCENTIVE SPIROMETER; BLOOD TRANSFUSION INFORMATION SHEET _____________________________________________________________________             [facility] - Preparing for Surgery Before surgery, you can play an important role.  Because skin is not sterile, your skin needs to be as free of germs as possible.  You can reduce the number of germs on your skin by washing with CHG (chlorahexidine gluconate) soap before surgery.  CHG is an antiseptic cleaner which kills germs and bonds with the skin to continue killing germs even after washing. Please DO NOT use if you have an allergy to CHG or  antibacterial soaps.  If your skin becomes reddened/irritated stop using the CHG and inform your nurse when you arrive at Short Stay. Do not shave (including legs and underarms) for at least 48 hours prior to the first CHG shower.  You may shave your face/neck. Please follow these instructions carefully:  1.  Shower with CHG Soap the night before surgery and the  morning of Surgery.  2.  If you choose to wash your hair, wash your hair first as usual with your  normal  shampoo.  3.  After you shampoo, rinse your hair and body thoroughly to remove the  shampoo.                           4.  Use CHG as you would any other liquid soap.  You can apply chg directly  to the skin and wash  Gently with a scrungie or clean washcloth.  5.  Apply the CHG Soap to your body ONLY FROM THE NECK DOWN.   Do not use on face/ open                           Wound or open sores. Avoid contact with eyes, ears mouth and genitals (private parts).                       Wash face,  Genitals (private parts) with your normal soap.             6.  Wash thoroughly, paying special attention to the area where your surgery  will be performed.  7.  Thoroughly rinse your body with warm water from the neck down.  8.  DO NOT shower/wash with your normal soap after using and rinsing off  the CHG Soap.                9.  Pat yourself dry with a clean towel.            10.  Wear clean pajamas.            11.  Place clean sheets on your bed the night of your first shower and do not  sleep with pets. Day of Surgery : Do not apply any lotions/deodorants the morning of surgery.  Please wear clean clothes to the hospital/surgery center.  FAILURE TO FOLLOW THESE INSTRUCTIONS MAY RESULT IN THE CANCELLATION OF YOUR SURGERY PATIENT SIGNATURE_________________________________  NURSE SIGNATURE__________________________________  ________________________________________________________________________   [name]  An incentive spirometer is a tool that can help keep your lungs clear and active. This tool measures how well you are filling your lungs with each breath. Taking long deep breaths may help reverse or decrease the chance of developing breathing (pulmonary) problems (especially infection) following:  A long period of time when you are unable to move or be active. BEFORE THE PROCEDURE   If the spirometer includes an indicator to show your best effort, your nurse or respiratory therapist will set it to a desired goal.  If possible, sit up straight or lean slightly forward. Try not to slouch.  Hold the incentive spirometer in an upright position. INSTRUCTIONS FOR USE   Sit on the edge of your bed if possible, or sit up as far as you can in bed or on a chair.  Hold the incentive spirometer in an upright position.  Breathe out normally.  Place the mouthpiece in your mouth and seal your lips tightly around it.  Breathe in slowly and as deeply as possible, raising the piston or the ball toward the top of the column.  Hold your breath for 3-5 seconds or for as long as possible. Allow the piston or ball to fall to the bottom of the column.  Remove the mouthpiece from your mouth and breathe out normally.  Rest for a few seconds and repeat Steps 1 through 7 at least 10 times every 1-2 hours when you are awake. Take your time and take a few normal breaths between deep breaths.  The spirometer may include an indicator to show your best effort. Use the indicator as a goal to work toward during each repetition.  After each set of 10 deep breaths, practice coughing to be sure your lungs are clear. If you have an incision (the cut made at the time of surgery),  support your incision when coughing by placing a pillow or rolled up towels firmly against it. Once you are able to get out of bed, walk around indoors and cough well. You may stop using the incentive spirometer when instructed by  your caregiver.  RISKS AND COMPLICATIONS  Take your time so you do not get dizzy or light-headed.  If you are in pain, you may need to take or ask for pain medication before doing incentive spirometry. It is harder to take a deep breath if you are having pain. AFTER USE  Rest and breathe slowly and easily.  It can be helpful to keep track of a log of your progress. Your caregiver can provide you with a simple table to help with this. If you are using the spirometer at home, follow these instructions: [place] IF:   You are having difficultly using the spirometer.  You have trouble using the spirometer as often as instructed.  Your pain medication is not giving enough relief while using the spirometer.  You develop fever of 100.5 F (38.1 C) or higher. SEEK IMMEDIATE MEDICAL CARE IF:   You cough up bloody sputum that had not been present before.  You develop fever of 102 F (38.9 C) or greater.  You develop worsening pain at or near the incision site. MAKE SURE YOU:   Understand these instructions.  Will watch your condition.  Will get help right away if you are not doing well or get worse. Document Released: 03/25/2007 Document Revised: 02/04/2012 Document Reviewed: 05/26/2007 ExitCare Patient Information 2014 [employer], [place].   ________________________________________________________________________  WHAT IS A BLOOD TRANSFUSION? Blood Transfusion Information  A transfusion is the replacement of blood or some of its parts. Blood is made up of multiple cells which provide different functions.  Red blood cells carry oxygen and are used for blood loss replacement.  White blood cells fight against infection.  Platelets control bleeding.  Plasma helps clot blood.  Other blood products are available for specialized needs, such as hemophilia or other clotting disorders. BEFORE THE TRANSFUSION  Who gives blood for transfusions?   Healthy volunteers who are  fully evaluated to make sure their blood is safe. This is blood bank blood. Transfusion therapy is the safest it has ever been in the practice of medicine. Before blood is taken from a donor, a complete history is taken to make sure that person has no history of diseases nor engages in risky social behavior (examples are intravenous drug use or sexual activity with multiple partners). The donor's travel history is screened to minimize risk of transmitting infections, such as malaria. The donated blood is tested for signs of infectious diseases, such as HIV and hepatitis. The blood is then tested to be sure it is compatible with you in order to minimize the chance of a transfusion reaction. If you or a relative donates blood, this is often done in anticipation of surgery and is not appropriate for emergency situations. It takes many days to process the donated blood. RISKS AND COMPLICATIONS Although transfusion therapy is very safe and saves many lives, the main dangers of transfusion include:   Getting an infectious disease.  Developing a transfusion reaction. This is an allergic reaction to something in the blood you were given. Every precaution is taken to prevent this. The decision to have a blood transfusion has been considered carefully by your caregiver before blood is given. Blood is not given unless the benefits outweigh the risks. AFTER THE TRANSFUSION  Right after receiving a blood transfusion, you will usually feel much better and more energetic. This is especially true if your red blood cells have gotten low (anemic). The transfusion raises the level of the red blood cells which carry oxygen, and this usually causes an energy increase.  The nurse administering the transfusion will monitor you carefully for complications. HOME CARE INSTRUCTIONS  No special instructions are needed after a transfusion. You may find your energy is better. Speak with your caregiver about any limitations on  activity for underlying diseases you may have. SEEK MEDICAL CARE IF:   Your condition is not improving after your transfusion.  You develop redness or irritation at the intravenous (IV) site. SEEK IMMEDIATE MEDICAL CARE IF:  Any of the following symptoms occur over the next 12 hours:  Shaking chills.  You have a temperature by mouth above 102 F (38.9 C), not controlled by medicine.  Chest, back, or muscle pain.  People around you feel you are not acting correctly or are confused.  Shortness of breath or difficulty breathing.  Dizziness and fainting.  You get a rash or develop hives.  You have a decrease in urine output.  Your urine turns a dark color or changes to pink, red, or brown. Any of the following symptoms occur over the next 10 days:  You have a temperature by mouth above 102 F (38.9 C), not controlled by medicine.  Shortness of breath.  Weakness after normal activity.  The white part of the eye turns yellow (jaundice).  You have a decrease in the amount of urine or are urinating less often.  Your urine turns a dark color or changes to pink, red, or brown. Document Released: 11/09/2000 Document Revised: 02/04/2012 Document Reviewed: 06/28/2008 [facility] Patient Information 2014 [place], [place].  _______________________________________________________________________

## 2015-08-23 ENCOUNTER — Other Ambulatory Visit: Payer: Self-pay

## 2015-08-23 ENCOUNTER — Ambulatory Visit (HOSPITAL_COMMUNITY): Payer: 59 | Attending: Cardiovascular Disease

## 2015-08-23 DIAGNOSIS — I517 Cardiomegaly: Secondary | ICD-10-CM | POA: Diagnosis not present

## 2015-08-23 DIAGNOSIS — R0600 Dyspnea, unspecified: Secondary | ICD-10-CM

## 2015-08-23 DIAGNOSIS — Z8249 Family history of ischemic heart disease and other diseases of the circulatory system: Secondary | ICD-10-CM | POA: Diagnosis not present

## 2015-08-23 DIAGNOSIS — E785 Hyperlipidemia, unspecified: Secondary | ICD-10-CM | POA: Diagnosis not present

## 2015-08-23 DIAGNOSIS — R0789 Other chest pain: Secondary | ICD-10-CM | POA: Diagnosis not present

## 2015-08-25 ENCOUNTER — Encounter (HOSPITAL_COMMUNITY)
Admission: RE | Admit: 2015-08-25 | Discharge: 2015-08-25 | Disposition: A | Discharge status: Home / Self Care | Payer: 59 | Source: Ambulatory Visit | Attending: Orthopedic Surgery | Admitting: Orthopedic Surgery

## 2015-08-25 ENCOUNTER — Encounter: Payer: Self-pay | Admitting: Family Medicine

## 2015-08-25 ENCOUNTER — Encounter (HOSPITAL_COMMUNITY): Payer: Self-pay

## 2015-08-25 DIAGNOSIS — Z01818 Encounter for other preprocedural examination: Secondary | ICD-10-CM | POA: Diagnosis present

## 2015-08-25 DIAGNOSIS — M179 Osteoarthritis of knee, unspecified: Secondary | ICD-10-CM | POA: Insufficient documentation

## 2015-08-25 HISTORY — DX: Nausea with vomiting, unspecified: R11.2

## 2015-08-25 HISTORY — DX: Unspecified urinary incontinence: R32

## 2015-08-25 HISTORY — DX: Radiculopathy, lumbar region: M54.16

## 2015-08-25 HISTORY — DX: Frequency of micturition: R35.0

## 2015-08-25 HISTORY — DX: Chronic fatigue, unspecified: R53.82

## 2015-08-25 HISTORY — DX: Myalgic encephalomyelitis/chronic fatigue syndrome: G93.32

## 2015-08-25 HISTORY — DX: Other specified postprocedural states: Z98.890

## 2015-08-25 LAB — TYPE AND SCREEN
ABO/RH(D): O POS
Antibody Screen: NEGATIVE

## 2015-08-25 LAB — COMPREHENSIVE METABOLIC PANEL
ALT: 24 U/L (ref 14–54)
AST: 25 U/L (ref 15–41)
Albumin: 3.6 g/dL (ref 3.5–5.0)
Alkaline Phosphatase: 103 U/L (ref 38–126)
Anion gap: 3 — ABNORMAL LOW (ref 5–15)
BUN: 18 mg/dL (ref 6–20)
CO2: 30 mmol/L (ref 22–32)
Calcium: 9 mg/dL (ref 8.9–10.3)
Chloride: 107 mmol/L (ref 101–111)
Creatinine, Ser: 0.94 mg/dL (ref 0.44–1.00)
GFR calc Af Amer: >60 mL/min (ref >60)
GFR calc non Af Amer: >60 mL/min (ref >60)
Glucose, Bld: 96 mg/dL (ref 65–99)
Potassium: 4.3 mmol/L (ref 3.5–5.1)
Sodium: 140 mmol/L (ref 135–145)
Total Bilirubin: 0.4 mg/dL (ref 0.3–1.2)
Total Protein: 7.2 g/dL (ref 6.5–8.1)

## 2015-08-25 LAB — PROTIME-INR
INR: 1 (ref 0.00–1.49)
Prothrombin Time: 13.4 seconds (ref 11.6–15.2)

## 2015-08-25 LAB — URINALYSIS, ROUTINE W REFLEX MICROSCOPIC
Bilirubin Urine: NEGATIVE
Glucose, UA: NEGATIVE mg/dL
Hgb urine dipstick: NEGATIVE
Ketones, ur: NEGATIVE mg/dL
Leukocytes, UA: NEGATIVE
Nitrite: NEGATIVE
Protein, ur: NEGATIVE mg/dL
Specific Gravity, Urine: 1.015 (ref 1.005–1.030)
Urobilinogen, UA: 0.2 mg/dL (ref 0.0–1.0)
pH: 7.5 (ref 5.0–8.0)

## 2015-08-25 LAB — CBC
HCT: 40.5 % (ref 36.0–46.0)
Hemoglobin: 12.5 g/dL (ref 12.0–15.0)
MCH: 28.9 pg (ref 26.0–34.0)
MCHC: 30.9 g/dL (ref 30.0–36.0)
MCV: 93.5 fL (ref 78.0–100.0)
Platelets: 464 10*3/uL — ABNORMAL HIGH (ref 150–400)
RBC: 4.33 MIL/uL (ref 3.87–5.11)
RDW: 14.8 % (ref 11.5–15.5)
WBC: 10.7 10*3/uL — ABNORMAL HIGH (ref 4.0–10.5)

## 2015-08-25 LAB — SURGICAL PCR SCREEN
MRSA, PCR: NEGATIVE
Staphylococcus aureus: POSITIVE — AB

## 2015-08-25 LAB — ABO/RH: ABO/RH(D): O POS

## 2015-08-25 LAB — APTT: aPTT: 29 seconds (ref 24–37)

## 2015-08-25 NOTE — Progress Notes (Signed)
EKG/epic and chart 07/25/2015 ECHO/epic and chart 08/23/2015  Stress test/epic and chart 07/28/2015 Anesthesia / Dr Smith Robert came discussed H&P, reviewed EKG,ECHO and Stress reports / pt and addressed pts concerns with nausea and vomiting. Anesthesia to see pt again day of surgery.

## 2015-08-25 NOTE — Progress Notes (Signed)
Surgical screening results/epic per PAT visit 08/25/2015 positive for STAPH; results sent to Dr Wynelle Link. Prescription for Mupriocin Ointment called to [facility];spoke with [facility] - pharmacy tech. Pt aware.

## 2015-08-29 ENCOUNTER — Other Ambulatory Visit (HOSPITAL_COMMUNITY): Payer: 59

## 2015-09-04 ENCOUNTER — Encounter (HOSPITAL_COMMUNITY): Payer: Self-pay | Admitting: Anesthesiology

## 2015-09-04 NOTE — Anesthesia Preprocedure Evaluation (Addendum)
Anesthesia Evaluation  Patient identified by MRN, date of birth, ID band Patient awake    Reviewed: Allergy & Precautions, NPO status , Patient's Chart, lab work & pertinent test results  History of Anesthesia Complications (+) PONV and history of anesthetic complications  Airway Mallampati: II  TM Distance: >3 FB Neck ROM: Full    Dental no notable dental hx.    Pulmonary shortness of breath, sleep apnea and Continuous Positive Airway Pressure Ventilation , pneumonia, resolved,    Pulmonary exam normal breath sounds clear to auscultation       Cardiovascular Normal cardiovascular exam Rhythm:Regular Rate:Normal  Recent ECHO and nuclear stress test reviewed. EF normal. No aortic stenosis.   Neuro/Psych  Headaches, PSYCHIATRIC DISORDERS Anxiety Depression  Neuromuscular disease    GI/Hepatic Neg liver ROS, GERD  ,  Endo/Other  Morbid obesity  Renal/GU Renal disease  negative genitourinary   Musculoskeletal  (+) Arthritis , Fibromyalgia -  Abdominal (+) + obese,   Peds negative pediatric ROS (+)  Hematology negative hematology ROS (+) anemia ,   Anesthesia Other Findings   Reproductive/Obstetrics negative OB ROS                            Anesthesia Physical Anesthesia Plan  ASA: III  Anesthesia Plan: Spinal   Post-op Pain Management:    Induction: Intravenous  Airway Management Planned: Natural Airway  Additional Equipment:   Intra-op Plan:   Post-operative Plan:   Informed Consent: I have reviewed the patients History and Physical, chart, labs and discussed the procedure including the risks, benefits and alternatives for the proposed anesthesia with the patient or authorized representative who has indicated his/her understanding and acceptance.   Dental advisory given  Plan Discussed with: CRNA  Anesthesia Plan Comments: (Discussed risks and benefits of and differences  between spinal and general. Discussed risks of spinal including headache, backache, failure, bleeding and hematoma, infection, and nerve damage and paralysis. Patient consents to spinal. Questions answered. Coagulation studies and platelet count acceptable. )       Anesthesia Quick Evaluation

## 2015-09-05 ENCOUNTER — Inpatient Hospital Stay (HOSPITAL_COMMUNITY): Payer: 59 | Admitting: Anesthesiology

## 2015-09-05 ENCOUNTER — Encounter (HOSPITAL_COMMUNITY)
Admission: RE | Disposition: A | Discharge status: Home Health | Payer: Self-pay | Source: Ambulatory Visit | Attending: Orthopedic Surgery

## 2015-09-05 ENCOUNTER — Inpatient Hospital Stay (HOSPITAL_COMMUNITY)
Admission: RE | Admit: 2015-09-05 | Discharge: 2015-09-07 | DRG: 470 | Disposition: A | Discharge status: Home Health | Payer: 59 | Source: Ambulatory Visit | Attending: Orthopedic Surgery | Admitting: Orthopedic Surgery

## 2015-09-05 ENCOUNTER — Encounter (HOSPITAL_COMMUNITY): Payer: Self-pay | Admitting: *Deleted

## 2015-09-05 DIAGNOSIS — Z6841 Body Mass Index (BMI) 40.0 and over, adult: Secondary | ICD-10-CM | POA: Diagnosis not present

## 2015-09-05 DIAGNOSIS — M25761 Osteophyte, right knee: Secondary | ICD-10-CM | POA: Diagnosis present

## 2015-09-05 DIAGNOSIS — F329 Major depressive disorder, single episode, unspecified: Secondary | ICD-10-CM | POA: Diagnosis present

## 2015-09-05 DIAGNOSIS — G43909 Migraine, unspecified, not intractable, without status migrainosus: Secondary | ICD-10-CM | POA: Diagnosis present

## 2015-09-05 DIAGNOSIS — F419 Anxiety disorder, unspecified: Secondary | ICD-10-CM | POA: Diagnosis present

## 2015-09-05 DIAGNOSIS — Z881 Allergy status to other antibiotic agents status: Secondary | ICD-10-CM

## 2015-09-05 DIAGNOSIS — Z79899 Other long term (current) drug therapy: Secondary | ICD-10-CM

## 2015-09-05 DIAGNOSIS — Z01812 Encounter for preprocedural laboratory examination: Secondary | ICD-10-CM

## 2015-09-05 DIAGNOSIS — R5382 Chronic fatigue, unspecified: Secondary | ICD-10-CM | POA: Diagnosis present

## 2015-09-05 DIAGNOSIS — G2581 Restless legs syndrome: Secondary | ICD-10-CM | POA: Diagnosis present

## 2015-09-05 DIAGNOSIS — Z8249 Family history of ischemic heart disease and other diseases of the circulatory system: Secondary | ICD-10-CM

## 2015-09-05 DIAGNOSIS — K219 Gastro-esophageal reflux disease without esophagitis: Secondary | ICD-10-CM | POA: Diagnosis present

## 2015-09-05 DIAGNOSIS — M1711 Unilateral primary osteoarthritis, right knee: Principal | ICD-10-CM | POA: Diagnosis present

## 2015-09-05 DIAGNOSIS — M1710 Unilateral primary osteoarthritis, unspecified knee: Secondary | ICD-10-CM | POA: Diagnosis present

## 2015-09-05 DIAGNOSIS — E785 Hyperlipidemia, unspecified: Secondary | ICD-10-CM | POA: Diagnosis present

## 2015-09-05 DIAGNOSIS — G4733 Obstructive sleep apnea (adult) (pediatric): Secondary | ICD-10-CM | POA: Diagnosis present

## 2015-09-05 DIAGNOSIS — K3184 Gastroparesis: Secondary | ICD-10-CM | POA: Diagnosis present

## 2015-09-05 DIAGNOSIS — M179 Osteoarthritis of knee, unspecified: Secondary | ICD-10-CM | POA: Diagnosis present

## 2015-09-05 DIAGNOSIS — M25561 Pain in right knee: Secondary | ICD-10-CM | POA: Diagnosis present

## 2015-09-05 DIAGNOSIS — E7800 Pure hypercholesterolemia, unspecified: Secondary | ICD-10-CM | POA: Diagnosis present

## 2015-09-05 DIAGNOSIS — Z87442 Personal history of urinary calculi: Secondary | ICD-10-CM | POA: Diagnosis not present

## 2015-09-05 DIAGNOSIS — M797 Fibromyalgia: Secondary | ICD-10-CM | POA: Diagnosis present

## 2015-09-05 HISTORY — PX: TOTAL KNEE ARTHROPLASTY: SHX125

## 2015-09-05 SURGERY — ARTHROPLASTY, KNEE, TOTAL
Anesthesia: Spinal | Site: Knee | Laterality: Right

## 2015-09-05 MED ORDER — PREGABALIN 75 MG PO CAPS
75.0000 mg | ORAL_CAPSULE | Freq: Two times a day (BID) | ORAL | Status: DC
  Administered 2015-09-05 – 2015-09-07 (×4): 75 mg via ORAL
  Filled 2015-09-05 (×4): qty 1

## 2015-09-05 MED ORDER — BUPIVACAINE HCL (PF) 0.25 % IJ SOLN
INTRAMUSCULAR | Status: AC
  Filled 2015-09-05: qty 30

## 2015-09-05 MED ORDER — CEFAZOLIN SODIUM-DEXTROSE 2-3 GM-% IV SOLR
INTRAVENOUS | Status: AC
  Filled 2015-09-05: qty 50

## 2015-09-05 MED ORDER — LACTATED RINGERS IV SOLN
INTRAVENOUS | Status: DC | PRN
  Administered 2015-09-05 (×2): via INTRAVENOUS

## 2015-09-05 MED ORDER — MODAFINIL 200 MG PO TABS
200.0000 mg | ORAL_TABLET | Freq: Every day | ORAL | Status: DC
  Administered 2015-09-06 – 2015-09-07 (×2): 200 mg via ORAL
  Filled 2015-09-05 (×2): qty 1

## 2015-09-05 MED ORDER — BUPIVACAINE LIPOSOME 1.3 % IJ SUSP
INTRAMUSCULAR | Status: DC | PRN
  Administered 2015-09-05: 20 mL

## 2015-09-05 MED ORDER — MIDAZOLAM HCL 5 MG/5ML IJ SOLN
INTRAMUSCULAR | Status: DC | PRN
  Administered 2015-09-05 (×2): 1 mg via INTRAVENOUS

## 2015-09-05 MED ORDER — PANTOPRAZOLE SODIUM 40 MG PO TBEC
40.0000 mg | DELAYED_RELEASE_TABLET | Freq: Two times a day (BID) | ORAL | Status: DC
  Administered 2015-09-05 – 2015-09-07 (×4): 40 mg via ORAL
  Filled 2015-09-05 (×5): qty 1

## 2015-09-05 MED ORDER — CLONAZEPAM 0.5 MG PO TABS
0.5000 mg | ORAL_TABLET | Freq: Every day | ORAL | Status: DC
  Administered 2015-09-05 – 2015-09-06 (×2): 0.5 mg via ORAL
  Filled 2015-09-05 (×2): qty 1

## 2015-09-05 MED ORDER — ONDANSETRON HCL 4 MG/2ML IJ SOLN
INTRAMUSCULAR | Status: AC
  Filled 2015-09-05: qty 2

## 2015-09-05 MED ORDER — ONDANSETRON HCL 4 MG PO TABS: 4.0000 mg | ORAL_TABLET | Freq: Four times a day (QID) | ORAL | Status: DC | PRN

## 2015-09-05 MED ORDER — DEXAMETHASONE SODIUM PHOSPHATE 10 MG/ML IJ SOLN
INTRAMUSCULAR | Status: AC
  Filled 2015-09-05: qty 1

## 2015-09-05 MED ORDER — PROPOFOL 10 MG/ML IV BOLUS
INTRAVENOUS | Status: AC
  Filled 2015-09-05: qty 40

## 2015-09-05 MED ORDER — LACTATED RINGERS IV SOLN: INTRAVENOUS | Status: DC

## 2015-09-05 MED ORDER — PROPOFOL 10 MG/ML IV BOLUS
INTRAVENOUS | Status: AC
  Filled 2015-09-05: qty 20

## 2015-09-05 MED ORDER — METOCLOPRAMIDE HCL 10 MG PO TABS: 5.0000 mg | ORAL_TABLET | Freq: Three times a day (TID) | ORAL | Status: DC | PRN

## 2015-09-05 MED ORDER — ACETAMINOPHEN 10 MG/ML IV SOLN
1000.0000 mg | Freq: Once | INTRAVENOUS | Status: AC
  Administered 2015-09-05: 1000 mg via INTRAVENOUS
  Filled 2015-09-05: qty 100

## 2015-09-05 MED ORDER — FENTANYL CITRATE (PF) 100 MCG/2ML IJ SOLN
INTRAMUSCULAR | Status: AC
  Filled 2015-09-05: qty 4

## 2015-09-05 MED ORDER — ONDANSETRON HCL 4 MG/2ML IJ SOLN
INTRAMUSCULAR | Status: DC | PRN
  Administered 2015-09-05: 4 mg via INTRAVENOUS

## 2015-09-05 MED ORDER — CEFAZOLIN SODIUM-DEXTROSE 2-3 GM-% IV SOLR
2.0000 g | Freq: Four times a day (QID) | INTRAVENOUS | Status: AC
  Administered 2015-09-05 (×2): 2 g via INTRAVENOUS
  Filled 2015-09-05 (×2): qty 50

## 2015-09-05 MED ORDER — SODIUM CHLORIDE 0.9 % IJ SOLN
INTRAMUSCULAR | Status: AC
  Filled 2015-09-05: qty 50

## 2015-09-05 MED ORDER — ACETAMINOPHEN 650 MG RE SUPP: 650.0000 mg | Freq: Four times a day (QID) | RECTAL | Status: DC | PRN

## 2015-09-05 MED ORDER — LORATADINE 10 MG PO TABS
10.0000 mg | ORAL_TABLET | Freq: Every day | ORAL | Status: DC
  Administered 2015-09-05 – 2015-09-06 (×2): 10 mg via ORAL
  Filled 2015-09-05 (×3): qty 1

## 2015-09-05 MED ORDER — RIVAROXABAN 10 MG PO TABS
10.0000 mg | ORAL_TABLET | Freq: Every day | ORAL | Status: DC
  Administered 2015-09-06 – 2015-09-07 (×2): 10 mg via ORAL
  Filled 2015-09-05 (×3): qty 1

## 2015-09-05 MED ORDER — METOCLOPRAMIDE HCL 5 MG/ML IJ SOLN: 5.0000 mg | Freq: Three times a day (TID) | INTRAMUSCULAR | Status: DC | PRN

## 2015-09-05 MED ORDER — PHENYLEPHRINE HCL 10 MG/ML IJ SOLN
10.0000 mg | INTRAMUSCULAR | Status: DC | PRN
  Administered 2015-09-05: 50 ug/min via INTRAVENOUS

## 2015-09-05 MED ORDER — ALPRAZOLAM 0.25 MG PO TABS: 0.2500 mg | ORAL_TABLET | Freq: Three times a day (TID) | ORAL | Status: DC | PRN

## 2015-09-05 MED ORDER — SODIUM CHLORIDE 0.9 % IR SOLN
Status: DC | PRN
  Administered 2015-09-05: 1000 mL

## 2015-09-05 MED ORDER — DEXAMETHASONE SODIUM PHOSPHATE 10 MG/ML IJ SOLN
10.0000 mg | Freq: Once | INTRAMUSCULAR | Status: AC
  Administered 2015-09-06: 10 mg via INTRAVENOUS
  Filled 2015-09-05: qty 1

## 2015-09-05 MED ORDER — PHENOL 1.4 % MT LIQD: 1.0000 | OROMUCOSAL | Status: DC | PRN

## 2015-09-05 MED ORDER — SODIUM CHLORIDE 0.9 % IV SOLN
INTRAVENOUS | Status: DC
  Administered 2015-09-05 – 2015-09-06 (×2): via INTRAVENOUS

## 2015-09-05 MED ORDER — PHENYLEPHRINE HCL 10 MG/ML IJ SOLN
INTRAMUSCULAR | Status: AC
Start: 2015-09-05 — End: 2015-09-05
  Filled 2015-09-05: qty 1

## 2015-09-05 MED ORDER — BUPIVACAINE LIPOSOME 1.3 % IJ SUSP
20.0000 mL | Freq: Once | INTRAMUSCULAR | Status: DC
  Filled 2015-09-05: qty 20

## 2015-09-05 MED ORDER — SODIUM CHLORIDE 0.9 % IV SOLN: INTRAVENOUS | Status: DC

## 2015-09-05 MED ORDER — ONDANSETRON HCL 4 MG/2ML IJ SOLN
4.0000 mg | Freq: Four times a day (QID) | INTRAMUSCULAR | Status: DC | PRN
  Administered 2015-09-07: 4 mg via INTRAVENOUS
  Filled 2015-09-05: qty 2

## 2015-09-05 MED ORDER — PHILLIPS COLON HEALTH PO CAPS: 1.0000 | ORAL_CAPSULE | Freq: Every day | ORAL | Status: DC

## 2015-09-05 MED ORDER — ATORVASTATIN CALCIUM 40 MG PO TABS
40.0000 mg | ORAL_TABLET | Freq: Every day | ORAL | Status: DC
  Administered 2015-09-05 – 2015-09-06 (×2): 40 mg via ORAL
  Filled 2015-09-05 (×3): qty 1

## 2015-09-05 MED ORDER — PROMETHAZINE HCL 25 MG RE SUPP: 25.0000 mg | Freq: Four times a day (QID) | RECTAL | Status: DC | PRN

## 2015-09-05 MED ORDER — ACETAMINOPHEN 10 MG/ML IV SOLN
INTRAVENOUS | Status: AC
  Filled 2015-09-05: qty 100

## 2015-09-05 MED ORDER — CEFAZOLIN SODIUM-DEXTROSE 2-3 GM-% IV SOLR
2.0000 g | INTRAVENOUS | Status: AC
  Administered 2015-09-05: 2 g via INTRAVENOUS

## 2015-09-05 MED ORDER — DOCUSATE SODIUM 100 MG PO CAPS
100.0000 mg | ORAL_CAPSULE | Freq: Two times a day (BID) | ORAL | Status: DC
  Administered 2015-09-05 – 2015-09-07 (×4): 100 mg via ORAL

## 2015-09-05 MED ORDER — FENTANYL CITRATE (PF) 100 MCG/2ML IJ SOLN
INTRAMUSCULAR | Status: DC | PRN
  Administered 2015-09-05: 100 ug via INTRAVENOUS

## 2015-09-05 MED ORDER — DEXAMETHASONE SODIUM PHOSPHATE 10 MG/ML IJ SOLN
10.0000 mg | Freq: Once | INTRAMUSCULAR | Status: AC
  Administered 2015-09-05: 10 mg via INTRAVENOUS

## 2015-09-05 MED ORDER — SUMATRIPTAN SUCCINATE 50 MG PO TABS
50.0000 mg | ORAL_TABLET | ORAL | Status: DC | PRN
  Filled 2015-09-05: qty 1

## 2015-09-05 MED ORDER — ARIPIPRAZOLE 5 MG PO TABS
5.0000 mg | ORAL_TABLET | Freq: Every morning | ORAL | Status: DC
  Administered 2015-09-06 – 2015-09-07 (×2): 5 mg via ORAL
  Filled 2015-09-05 (×2): qty 1

## 2015-09-05 MED ORDER — EPHEDRINE SULFATE 50 MG/ML IJ SOLN
INTRAMUSCULAR | Status: AC
  Filled 2015-09-05: qty 1

## 2015-09-05 MED ORDER — 0.9 % SODIUM CHLORIDE (POUR BTL) OPTIME
TOPICAL | Status: DC | PRN
  Administered 2015-09-05: 1000 mL

## 2015-09-05 MED ORDER — MORPHINE SULFATE (PF) 2 MG/ML IV SOLN
1.0000 mg | INTRAVENOUS | Status: DC | PRN
  Administered 2015-09-05: 1 mg via INTRAVENOUS
  Filled 2015-09-05: qty 1

## 2015-09-05 MED ORDER — ACETAMINOPHEN 500 MG PO TABS
1000.0000 mg | ORAL_TABLET | Freq: Four times a day (QID) | ORAL | Status: AC
  Administered 2015-09-05 – 2015-09-06 (×3): 1000 mg via ORAL
  Filled 2015-09-05 (×2): qty 2

## 2015-09-05 MED ORDER — FLUTICASONE PROPIONATE 50 MCG/ACT NA SUSP
2.0000 | Freq: Every day | NASAL | Status: DC
  Administered 2015-09-06 – 2015-09-07 (×2): 2 via NASAL
  Filled 2015-09-05: qty 16

## 2015-09-05 MED ORDER — PROPOFOL 10 MG/ML IV BOLUS
INTRAVENOUS | Status: DC | PRN
  Administered 2015-09-05: 10 mg via INTRAVENOUS

## 2015-09-05 MED ORDER — PHENYLEPHRINE 40 MCG/ML (10ML) SYRINGE FOR IV PUSH (FOR BLOOD PRESSURE SUPPORT)
PREFILLED_SYRINGE | INTRAVENOUS | Status: AC
  Filled 2015-09-05: qty 10

## 2015-09-05 MED ORDER — ACETAMINOPHEN 325 MG PO TABS
650.0000 mg | ORAL_TABLET | Freq: Four times a day (QID) | ORAL | Status: DC | PRN
  Administered 2015-09-07: 650 mg via ORAL
  Filled 2015-09-05: qty 2

## 2015-09-05 MED ORDER — TOPIRAMATE 25 MG PO TABS
50.0000 mg | ORAL_TABLET | Freq: Every day | ORAL | Status: DC
  Administered 2015-09-05 – 2015-09-06 (×2): 50 mg via ORAL
  Filled 2015-09-05 (×3): qty 2

## 2015-09-05 MED ORDER — ONDANSETRON HCL 4 MG PO TABS: 8.0000 mg | ORAL_TABLET | Freq: Three times a day (TID) | ORAL | Status: DC | PRN

## 2015-09-05 MED ORDER — NORTRIPTYLINE HCL 25 MG PO CAPS
100.0000 mg | ORAL_CAPSULE | Freq: Every day | ORAL | Status: DC
  Administered 2015-09-05 – 2015-09-06 (×2): 100 mg via ORAL
  Filled 2015-09-05 (×3): qty 4

## 2015-09-05 MED ORDER — POLYETHYLENE GLYCOL 3350 17 G PO PACK: 17.0000 g | PACK | Freq: Every day | ORAL | Status: DC | PRN

## 2015-09-05 MED ORDER — SACCHAROMYCES BOULARDII 250 MG PO CAPS
250.0000 mg | ORAL_CAPSULE | Freq: Two times a day (BID) | ORAL | Status: DC
  Administered 2015-09-05 – 2015-09-07 (×4): 250 mg via ORAL
  Filled 2015-09-05 (×5): qty 1

## 2015-09-05 MED ORDER — METOPROLOL TARTRATE 50 MG PO TABS
50.0000 mg | ORAL_TABLET | Freq: Two times a day (BID) | ORAL | Status: DC
  Administered 2015-09-05 – 2015-09-07 (×4): 50 mg via ORAL
  Filled 2015-09-05 (×5): qty 1

## 2015-09-05 MED ORDER — MORPHINE SULFATE (PF) 10 MG/ML IV SOLN: 1.0000 mg | INTRAVENOUS | Status: DC | PRN

## 2015-09-05 MED ORDER — METHOCARBAMOL 1000 MG/10ML IJ SOLN
500.0000 mg | Freq: Four times a day (QID) | INTRAVENOUS | Status: DC | PRN
  Filled 2015-09-05: qty 5

## 2015-09-05 MED ORDER — FLEET ENEMA 7-19 GM/118ML RE ENEM: 1.0000 | ENEMA | Freq: Once | RECTAL | Status: DC | PRN

## 2015-09-05 MED ORDER — PHENYLEPHRINE HCL 10 MG/ML IJ SOLN
INTRAMUSCULAR | Status: DC | PRN
  Administered 2015-09-05: 80 ug via INTRAVENOUS

## 2015-09-05 MED ORDER — DIPHENHYDRAMINE HCL 12.5 MG/5ML PO ELIX: 12.5000 mg | ORAL_SOLUTION | ORAL | Status: DC | PRN

## 2015-09-05 MED ORDER — SODIUM CHLORIDE 0.9 % IJ SOLN
INTRAMUSCULAR | Status: DC | PRN
  Administered 2015-09-05: 30 mL

## 2015-09-05 MED ORDER — BISACODYL 10 MG RE SUPP: 10.0000 mg | Freq: Every day | RECTAL | Status: DC | PRN

## 2015-09-05 MED ORDER — METHOCARBAMOL 500 MG PO TABS
500.0000 mg | ORAL_TABLET | Freq: Four times a day (QID) | ORAL | Status: DC | PRN
  Administered 2015-09-05 – 2015-09-07 (×7): 500 mg via ORAL
  Filled 2015-09-05 (×7): qty 1

## 2015-09-05 MED ORDER — PROMETHAZINE HCL 25 MG/ML IJ SOLN: 6.2500 mg | INTRAMUSCULAR | Status: DC | PRN

## 2015-09-05 MED ORDER — BUPIVACAINE HCL 0.25 % IJ SOLN
INTRAMUSCULAR | Status: DC | PRN
  Administered 2015-09-05: 20 mL

## 2015-09-05 MED ORDER — MENTHOL 3 MG MT LOZG: 1.0000 | LOZENGE | OROMUCOSAL | Status: DC | PRN

## 2015-09-05 MED ORDER — BUPIVACAINE IN DEXTROSE 0.75-8.25 % IT SOLN
INTRATHECAL | Status: DC | PRN
  Administered 2015-09-05: 1.6 mL via INTRATHECAL

## 2015-09-05 MED ORDER — MIDAZOLAM HCL 2 MG/2ML IJ SOLN
INTRAMUSCULAR | Status: AC
  Filled 2015-09-05: qty 4

## 2015-09-05 MED ORDER — CHLORHEXIDINE GLUCONATE 4 % EX LIQD: 60.0000 mL | Freq: Once | CUTANEOUS | Status: DC

## 2015-09-05 MED ORDER — EPHEDRINE SULFATE 50 MG/ML IJ SOLN
INTRAMUSCULAR | Status: DC | PRN
  Administered 2015-09-05 (×2): 5 mg via INTRAVENOUS

## 2015-09-05 MED ORDER — ARMODAFINIL 250 MG PO TABS: 250.0000 mg | ORAL_TABLET | Freq: Every morning | ORAL | Status: DC

## 2015-09-05 MED ORDER — DULOXETINE HCL 60 MG PO CPEP
60.0000 mg | ORAL_CAPSULE | Freq: Two times a day (BID) | ORAL | Status: DC
  Administered 2015-09-05 – 2015-09-07 (×4): 60 mg via ORAL
  Filled 2015-09-05 (×5): qty 1

## 2015-09-05 MED ORDER — SODIUM CHLORIDE 0.9 % IJ SOLN
INTRAMUSCULAR | Status: AC
  Filled 2015-09-05: qty 10

## 2015-09-05 MED ORDER — KETOROLAC TROMETHAMINE 15 MG/ML IJ SOLN
7.5000 mg | Freq: Four times a day (QID) | INTRAMUSCULAR | Status: AC | PRN
  Administered 2015-09-05: 7.5 mg via INTRAVENOUS
  Filled 2015-09-05: qty 1

## 2015-09-05 MED ORDER — PROPOFOL 500 MG/50ML IV EMUL
INTRAVENOUS | Status: DC | PRN
  Administered 2015-09-05: 50 ug/kg/min via INTRAVENOUS

## 2015-09-05 MED ORDER — TRIAMTERENE-HCTZ 37.5-25 MG PO CAPS
1.0000 | ORAL_CAPSULE | Freq: Every day | ORAL | Status: DC
  Filled 2015-09-05: qty 1

## 2015-09-05 MED ORDER — TRANEXAMIC ACID 1000 MG/10ML IV SOLN
1000.0000 mg | INTRAVENOUS | Status: AC
  Administered 2015-09-05: 1000 mg via INTRAVENOUS
  Filled 2015-09-05: qty 10

## 2015-09-05 MED ORDER — OXYCODONE HCL 5 MG PO TABS
5.0000 mg | ORAL_TABLET | ORAL | Status: DC | PRN
  Administered 2015-09-05 – 2015-09-07 (×12): 10 mg via ORAL
  Administered 2015-09-07: 5 mg via ORAL
  Filled 2015-09-05 (×6): qty 2
  Filled 2015-09-05: qty 1
  Filled 2015-09-05 (×6): qty 2

## 2015-09-05 MED ORDER — SODIUM CHLORIDE 0.9 % IV SOLN: 30.0000 ug/min | INTRAVENOUS | Status: DC

## 2015-09-05 SURGICAL SUPPLY — 64 items
BAG DECANTER FOR FLEXI CONT (MISCELLANEOUS) ×1 IMPLANT
BAG SPEC THK2 15X12 ZIP CLS (MISCELLANEOUS) ×1
BAG ZIPLOCK 12X15 (MISCELLANEOUS) ×2 IMPLANT
BANDAGE ELASTIC 6 VELCRO ST LF (GAUZE/BANDAGES/DRESSINGS) ×2 IMPLANT
BANDAGE ESMARK 6X9 LF (GAUZE/BANDAGES/DRESSINGS) ×1 IMPLANT
BLADE SAG 18X100X1.27 (BLADE) ×2 IMPLANT
BLADE SAW SGTL 11.0X1.19X90.0M (BLADE) ×2 IMPLANT
BNDG CMPR 9X6 STRL LF SNTH (GAUZE/BANDAGES/DRESSINGS) ×1
BNDG ESMARK 6X9 LF (GAUZE/BANDAGES/DRESSINGS) ×2
BOWL SMART MIX CTS (DISPOSABLE) ×2 IMPLANT
CAP KNEE TOTAL 3 SIGMA ×1 IMPLANT
CEMENT HV SMART SET (Cement) ×4 IMPLANT
CUFF TOURN SGL QUICK 34 (TOURNIQUET CUFF) ×2
CUFF TRNQT CYL 34X4X40X1 (TOURNIQUET CUFF) ×1 IMPLANT
DECANTER SPIKE VIAL GLASS SM (MISCELLANEOUS) ×2 IMPLANT
DRAPE EXTREMITY T 121X128X90 (DRAPE) ×2 IMPLANT
DRAPE POUCH INSTRU U-SHP 10X18 (DRAPES) ×2 IMPLANT
DRAPE U-SHAPE 47X51 STRL (DRAPES) ×2 IMPLANT
DRSG ADAPTIC 3X8 NADH LF (GAUZE/BANDAGES/DRESSINGS) ×2 IMPLANT
DRSG PAD ABDOMINAL 8X10 ST (GAUZE/BANDAGES/DRESSINGS) ×2 IMPLANT
DURAPREP 26ML APPLICATOR (WOUND CARE) ×2 IMPLANT
ELECT REM PT RETURN 9FT ADLT (ELECTROSURGICAL) ×2
ELECTRODE REM PT RTRN 9FT ADLT (ELECTROSURGICAL) ×1 IMPLANT
EVACUATOR 1/8 PVC DRAIN (DRAIN) ×2 IMPLANT
FACESHIELD WRAPAROUND (MASK) ×10 IMPLANT
FACESHIELD WRAPAROUND OR TEAM (MASK) ×5 IMPLANT
GAUZE SPONGE 4X4 12PLY STRL (GAUZE/BANDAGES/DRESSINGS) ×2 IMPLANT
GLOVE BIO SURGEON STRL SZ7.5 (GLOVE) ×1 IMPLANT
GLOVE BIO SURGEON STRL SZ8 (GLOVE) ×2 IMPLANT
GLOVE BIOGEL PI IND STRL 6.5 (GLOVE) IMPLANT
GLOVE BIOGEL PI IND STRL 8 (GLOVE) ×1 IMPLANT
GLOVE BIOGEL PI INDICATOR 6.5 (GLOVE)
GLOVE BIOGEL PI INDICATOR 8 (GLOVE) ×1
GLOVE SURG SS PI 6.5 STRL IVOR (GLOVE) IMPLANT
GOWN STRL REUS W/TWL LRG LVL3 (GOWN DISPOSABLE) ×2 IMPLANT
GOWN STRL REUS W/TWL XL LVL3 (GOWN DISPOSABLE) IMPLANT
HANDPIECE INTERPULSE COAX TIP (DISPOSABLE) ×2
IMMOBILIZER KNEE 20 (SOFTGOODS) ×2
IMMOBILIZER KNEE 20 THIGH 36 (SOFTGOODS) ×1 IMPLANT
KIT BASIN OR (CUSTOM PROCEDURE TRAY) ×2 IMPLANT
MANIFOLD NEPTUNE II (INSTRUMENTS) ×2 IMPLANT
NDL SAFETY ECLIPSE 18X1.5 (NEEDLE) ×2 IMPLANT
NEEDLE HYPO 18GX1.5 SHARP (NEEDLE) ×4
NS IRRIG 1000ML POUR BTL (IV SOLUTION) ×2 IMPLANT
PACK TOTAL JOINT (CUSTOM PROCEDURE TRAY) ×2 IMPLANT
PADDING CAST COTTON 6X4 STRL (CAST SUPPLIES) ×4 IMPLANT
PEN SKIN MARKING BROAD (MISCELLANEOUS) ×2 IMPLANT
POSITIONER SURGICAL ARM (MISCELLANEOUS) ×2 IMPLANT
SET HNDPC FAN SPRY TIP SCT (DISPOSABLE) ×1 IMPLANT
STRIP CLOSURE SKIN 1/2X4 (GAUZE/BANDAGES/DRESSINGS) ×3 IMPLANT
SUCTION FRAZIER 12FR DISP (SUCTIONS) ×2 IMPLANT
SUT MNCRL AB 4-0 PS2 18 (SUTURE) ×2 IMPLANT
SUT VIC AB 2-0 CT1 27 (SUTURE) ×6
SUT VIC AB 2-0 CT1 TAPERPNT 27 (SUTURE) ×3 IMPLANT
SUT VLOC 180 0 24IN GS25 (SUTURE) ×2 IMPLANT
SYR 20CC LL (SYRINGE) ×2 IMPLANT
SYR 50ML LL SCALE MARK (SYRINGE) ×2 IMPLANT
TOWEL OR 17X26 10 PK STRL BLUE (TOWEL DISPOSABLE) ×2 IMPLANT
TOWEL OR NON WOVEN STRL DISP B (DISPOSABLE) ×1 IMPLANT
TRAY FOLEY W/METER SILVER 14FR (SET/KITS/TRAYS/PACK) ×2 IMPLANT
TRAY FOLEY W/METER SILVER 16FR (SET/KITS/TRAYS/PACK) ×1 IMPLANT
WATER STERILE IRR 1500ML POUR (IV SOLUTION) ×2 IMPLANT
WRAP KNEE MAXI GEL POST OP (GAUZE/BANDAGES/DRESSINGS) ×2 IMPLANT
YANKAUER SUCT BULB TIP 10FT TU (MISCELLANEOUS) ×2 IMPLANT

## 2015-09-05 NOTE — Evaluation (Signed)
Physical Therapy Evaluation Patient Details Name: OTA EBERSOLE MRN: [id number] DOB: August 11, 1956 Today's Date: 09/05/2015   History of Present Illness  R TKA  Clinical Impression  Patient tolerated ambulating a short distance. Falls asleep quickly. Stimulation to stay aroused. Patient will benefit from PT to address problems listed in note below,    Follow Up Recommendations Home health PT;Supervision/Assistance - 24 hour    Equipment Recommendations  None recommended by PT    Recommendations for Other Services       Precautions / Restrictions Precautions Precautions: Fall;Knee Required Braces or Orthoses: Knee Immobilizer - Right Knee Immobilizer - Right: Discontinue once straight leg raise with < 10 degree lag      Mobility  Bed Mobility Overal bed mobility: Needs Assistance Bed Mobility: Supine to Sit;Sit to Supine     Supine to sit: Min assist Sit to supine: Min assist   General bed mobility comments: support of the right leg  Transfers Overall transfer level: Needs assistance Equipment used: Rolling walker (2 wheeled) Transfers: Sit to/from Stand Sit to Stand: Min assist         General transfer comment: cues for hand anr R leg position  Ambulation/Gait Ambulation/Gait assistance: Min assist Ambulation Distance (Feet): 20 Feet Assistive device: Rolling walker (2 wheeled) Gait Pattern/deviations: Step-to pattern;Decreased step length - right;Decreased stance time - right     General Gait Details: cues for sequence and  posture.  Stairs            Wheelchair Mobility    Modified Rankin (Stroke Patients Only)       Balance                                             Pertinent Vitals/Pain Pain Assessment: 0-10 Pain Score: 4  Pain Location: R knee Pain Descriptors / Indicators: Aching;Discomfort;Tightness Pain Intervention(s): Premedicated before session;Repositioned;Ice applied    Home Living Family/patient expects  to be discharged to:: Private residence Living Arrangements: Spouse/significant other Available Help at Discharge: Family Type of Home: House Home Access: Stairs to enter Entrance Stairs-Rails: None Entrance Stairs-Number of Steps: 1/2 + 1 Home Layout: One level Home Equipment: Bedside commode;Walker - 2 wheels      Prior Function Level of Independence: Independent               Hand Dominance        Extremity/Trunk Assessment               Lower Extremity Assessment: RLE deficits/detail RLE Deficits / Details: + SLR    Cervical / Trunk Assessment: Normal  Communication   Communication: No difficulties  Cognition Arousal/Alertness: Lethargic;Suspect due to medications Behavior During Therapy: [facility] for tasks assessed/performed Overall Cognitive Status: Within Functional Limits for tasks assessed                      General Comments      Exercises Total Joint Exercises Ankle Circles/Pumps: AROM;Both;10 reps Quad Sets: AROM;Both;10 reps      Assessment/Plan    PT Assessment Patient needs continued PT services  PT Diagnosis Difficulty walking;Acute pain   PT Problem List Decreased strength;Decreased range of motion;Decreased activity tolerance;Decreased mobility;Decreased knowledge of use of DME;Decreased safety awareness;Decreased knowledge of precautions;Pain  PT Treatment Interventions Gait training;Stair training;DME instruction;Functional mobility training;Therapeutic activities;Therapeutic exercise;Patient/family education   PT Goals (Current goals can  be found in the Care Plan section) Acute Rehab PT Goals Patient Stated Goal: to go home PT Goal Formulation: With patient/family Time For Goal Achievement: 09/09/15 Potential to Achieve Goals: Good    Frequency 7X/week   Barriers to discharge        Co-evaluation               End of Session Equipment Utilized During Treatment: Right knee immobilizer Activity Tolerance:  Patient tolerated treatment well Patient left: in bed;with call bell/phone within reach;with family/visitor present Nurse Communication: Mobility status         Time: 2725-3664 PT Time Calculation (min) (ACUTE ONLY): 22 min   Charges:   PT Evaluation $Initial PT Evaluation Tier I: 1 Procedure     PT G CodesClaretha Cooper 09/05/2015, 6:33 PM Tresa Endo PT [phone]

## 2015-09-05 NOTE — Anesthesia Procedure Notes (Signed)
Spinal  Start time: 09/05/2015 8:17 AM End time: 09/05/2015 8:27 AM Staffing Anesthesiologist: Franne Grip Resident/CRNA: Danley Danker L Performed by: anesthesiologist and resident/CRNA  Preanesthetic Checklist Completed: patient identified, site marked, surgical consent, pre-op evaluation, timeout performed, IV checked, risks and benefits discussed and monitors and equipment checked Spinal Block Patient position: sitting Prep: Betadine Patient monitoring: heart rate, continuous pulse ox and blood pressure Approach: midline Location: L3-4 Injection technique: single-shot Needle Needle type: Sprotte  Needle gauge: 24 G Needle length: 10 cm Assessment Sensory level: T6 Additional Notes Kit expiration date 01/24/2016 and lot # [id number] Negative heme, Negative paresthesia, free flow of CSF Tolerated well and returned to supine position

## 2015-09-05 NOTE — Interval H&P Note (Signed)
History and Physical Interval Note:  09/05/2015 6:49 AM  [name]  has presented today for surgery, with the diagnosis of RIGHT KNEE OA  The various methods of treatment have been discussed with the patient and family. After consideration of risks, benefits and other options for treatment, the patient has consented to  Procedure(s): RIGHT TOTAL KNEE ARTHROPLASTY (Right) as a surgical intervention .  The patient's history has been reviewed, patient examined, no change in status, stable for surgery.  I have reviewed the patient's chart and labs.  Questions were answered to the patient's satisfaction.     [name]

## 2015-09-05 NOTE — Transfer of Care (Signed)
Immediate Anesthesia Transfer of Care Note  Patient: [name]  Procedure(s) Performed: Procedure(s): RIGHT TOTAL KNEE ARTHROPLASTY (Right)  Patient Location: PACU  Anesthesia Type:Spinal  Level of Consciousness: awake  Airway & Oxygen Therapy: Patient Spontanous Breathing and Patient connected to face mask oxygen  Post-op Assessment: Report given to RN and Post -op Vital signs reviewed and stable  Post vital signs: Reviewed and stable  Last Vitals:  Filed Vitals:   09/05/15 0559  BP: 116/55  Pulse: 77  Temp: 36.6 C  Resp: 16    Complications: No apparent anesthesia complications

## 2015-09-05 NOTE — Progress Notes (Signed)
Utilization review completed.  

## 2015-09-05 NOTE — H&P (View-Only) (Signed)
[name] DOB: January 14, 1956 Married / Language: English / Race: White Female Date of Admission:  09/05/2015 CC:  Right knee pain History of Present Illness The patient is a 59 year old female who comes in for a preoperative History and Physical. The patient is scheduled for a right total knee arthroplasty to be performed by Dr. Dione Plover. Aluisio, MD at [facility] on 09/05/2015. The patient is a 59 year old female who presented for follow up of their knee. The patient is being followed for their bilateral knee pain and osteoarthritis. Symptoms reported include: pain. Note for "Follow-up Knee": She states that the right knee continues to hurt. It is painful upon first standing after being off of it for awhile. She continues to take aleve or tylenol prn. She does not feel that the visco series helped any. The left knee is painful as well and she believes that the cortisone did not help either. Unfortunately, her right knee has gotten progressively worse over the time. It is now limiting her as to what she can and cannot do. The viscosupplement did not help her knee at all. Cortisone has helped some in the past. It has not lasted more than a few weeks though. She has consistent pain and swelling. She does state now that she feels as though she needs to do something else with the knee. She is ready to proceed with surgery.  They have been treated conservatively in the past for the above stated problem and despite conservative measures, they continue to have progressive pain and severe functional limitations and dysfunction. They have failed non-operative management including home exercise, medications, and injections. It is felt that they would benefit from undergoing total joint replacement. Risks and benefits of the procedure have been discussed with the patient and they elect to proceed with surgery. There are no active contraindications to surgery such as ongoing infection or rapidly  progressive neurological disease.  Problem List/Past Medical Right knee pain (M25.561) Lumbar pain (M54.5) Primary osteoarthritis of right knee (M17.11) Degenerative lumbar disc (M51.36) Lumbar radiculopathy (M54.16) Depression Anemia Gastroesophageal Reflux Disease Chronic fatigue syndrome Fibromyalgia Anxiety Disorder Hypercholesterolemia Kidney Stone Sleep Apnea CPAP Peripheral Neuropathy Migraine Headache  Allergies No Known Drug Allergies  Intolerance Erythromycin *MACROLIDES* Stomach pain and GI upset  Family History  Severe allergy sister Heart Disease mother, father, grandmother mothers side, grandmother fathers side and grandfather fathers side Hypertension mother and father Father Deceased, Congestive Heart Failure. age 55 Mother Living, Arthritis, Hypertension. age 59  Social History  Children 2 Current work status disabled Exercise Exercises weekly; does running / walking Alcohol use never consumed alcohol Pain Contract no Illicit drug use no Living situation live with spouse Tobacco / smoke exposure no Tobacco use never smoker Drug/Alcohol Rehab (Previously) no Drug/Alcohol Rehab (Currently) no Marital status married Post-Surgical Plans Home following the Total Knee  Medication History SUMAtriptan Succinate (50MG  Tablet, Oral) Active. Methocarbamol (500MG  Tablet, Oral) Active. Ondansetron HCl (8MG  Tablet, Oral as needed) Active. Prochlorperazine Maleate (10MG  Tablet, Oral) Active. ICaps Plus (Oral) Active. Vivelle-Dot (0.05MG /24HR Patch TW, Transdermal) Active. Progesterone Micronized (100MG  Capsule, Oral) Active. Cymbalta Active. Pantoprazole Sodium Active. Lyrica Active. Abilify Active. ALPRAZolam (0.25MG  Tablet, Oral) Active. Aleve (220MG  Tablet, Oral) Active. Nuvigil (250MG  Tablet, Oral) Active. Nortriptyline HCl (50MG  Capsule, Oral) Active. Atorvastatin Calcium (40MG  Tablet, Oral)  Active. Propranolol HCl (20MG  Tablet, Oral) Active. Topiramate (25MG  Tablet, Oral) Active. Triamterene-HCTZ (37.5-25MG  Capsule, Oral) Active. ClonazePAM (0.5MG  Tablet, Oral) Active.  Past Surgical History Tonsillectomy Gallbladder Surgery  laporoscopic Arthroscopy of Knee right  Review of Systems General Present- Fatigue. Not Present- Chills, Fever, Memory Loss, Night Sweats, Weight Gain and Weight Loss. Skin Not Present- Eczema, Hives, Itching, Lesions and Rash. HEENT Not Present- Dentures, Double Vision, Headache, Hearing Loss, Tinnitus and Visual Loss. Respiratory Present- Shortness of breath with exertion. Not Present- Allergies, Chronic Cough, Coughing up blood and Shortness of breath at rest. Cardiovascular Not Present- Chest Pain, Difficulty Breathing Lying Down, Murmur, Palpitations, Racing/skipping heartbeats and Swelling. Gastrointestinal Present- Diarrhea and Nausea. Not Present- Abdominal Pain, Bloody Stool, Constipation, Difficulty Swallowing, Heartburn, Jaundice, Loss of appetitie and Vomiting. Female Genitourinary Present- Urinating at Night. Not Present- Blood in Urine, Discharge, Flank Pain, Incontinence, Painful Urination, Urgency, Urinary frequency, Urinary Retention and Weak urinary stream. Musculoskeletal Not Present- Back Pain, Joint Pain, Joint Swelling, Morning Stiffness, Muscle Pain, Muscle Weakness and Spasms. Neurological Not Present- Blackout spells, Difficulty with balance, Dizziness, Paralysis, Tremor and Weakness. Psychiatric Not Present- Insomnia.  Vitals Weight: 219 lb Height: 62in Weight was reported by patient. Height was reported by patient. Body Surface Area: 1.99 m Body Mass Index: 40.06 kg/m  Pulse: 92 (Regular)  BP: 118/78 (Sitting, Right Arm, Standard)  Physical Exam The physical exam findings are as follows: Note:Patient is a 59 year old female with continued right knee pain. Patient is accompanied today by her husband  Al.  General Mental Status -Alert, cooperative and good historian. General Appearance-pleasant, Not in acute distress. Orientation-Oriented X3. Build & Nutrition-Overweight, Well nourished and Well developed.  Head and Neck Head-normocephalic, atraumatic . Neck Global Assessment - supple, no bruit auscultated on the right, no bruit auscultated on the left.  Eye Vision-Wears contact lenses. Pupil - Bilateral-Regular and Round. Motion - Bilateral-EOMI.  Chest and Lung Exam Auscultation Breath sounds - clear at anterior chest wall and clear at posterior chest wall. Adventitious sounds - No Adventitious sounds.  Cardiovascular Auscultation Rhythm - Regular rate and rhythm. Heart Sounds - S1 WNL and S2 WNL. Murmurs & Other Heart Sounds - Auscultation of the heart reveals - No Murmurs.  Abdomen Inspection Contour - Generalized moderate distention. Palpation/Percussion Tenderness - Abdomen is non-tender to palpation. Rigidity (guarding) - Abdomen is soft. Auscultation Auscultation of the abdomen reveals - Bowel sounds normal.  Female Genitourinary Note: Not done, not pertinent to present illness   Musculoskeletal Note: On exam, a well-developed female in no distress. Her hips show normal motion, no discomfort. Left knee, no effusion. There is moderate crepitus with range of motion of the left knee with range being about 0 to 135. She does not have any tenderness or instability. Right knee has moderate-sized effusion, range 5 to 120, marked crepitus on range of motion, tenderness medial greater than lateral with no instability noted.  RADIOGRAPHS Recent radiographs from earlier this year, AP both knees and lateral and she still has some joint space medial and lateral on the right knee, but has bone on bone patellofemoral. Left knee also has bone on bone patellofemoral and intact medial and lateral joint space.  Assessment & Plan Primary osteoarthritis of right  knee (M17.11) Note:Surgical Plans: Right Total Knee Replacement  Disposition: Home  PCP: [facility], Dr. Rita Ohara  IV TXA  Anesthesia Issues: Postop Nausea in the past  Signed electronically by Alexzandrew Monika Salk, III PA-C

## 2015-09-05 NOTE — Anesthesia Postprocedure Evaluation (Signed)
  Anesthesia Post-op Note  Patient: [name]  Procedure(s) Performed: Procedure(s) (LRB): RIGHT TOTAL KNEE ARTHROPLASTY (Right)  Patient Location: PACU  Anesthesia Type: Spinal  Level of Consciousness: awake and alert   Airway and Oxygen Therapy: Patient Spontanous Breathing  Post-op Pain: mild  Post-op Assessment: Post-op Vital signs reviewed, Patient's Cardiovascular Status Stable, Respiratory Function Stable, Patent Airway and No signs of Nausea or vomiting. Purposeful movement both legs in PACU. Spinal receding.  Last Vitals:  Filed Vitals:   09/05/15 1202  BP: 103/57  Pulse: 72  Temp: 36.5 C  Resp: 12    Post-op Vital Signs: stable   Complications: No apparent anesthesia complications

## 2015-09-05 NOTE — Op Note (Signed)
Pre-operative diagnosis- Osteoarthritis  Right knee(s)  Post-operative diagnosis- Osteoarthritis Right knee(s)  Procedure-  Right  Total Knee Arthroplasty  Surgeon- Dione Plover. Voncille Simm, MD  Assistant- Arlee Muslim, PA-C   Anesthesia-  Spinal  EBL-* No blood loss amount entered *   Drains Hemovac  Tourniquet time-  Total Tourniquet Time Documented: Thigh (Right) - 28 minutes Total: Thigh (Right) - 28 minutes     Complications- None  Condition-PACU - hemodynamically stable.   Brief Clinical Note  [name] is a 59 y.o. year old female with end stage OA of her right knee with progressively worsening pain and dysfunction. She has constant pain, with activity and at rest and significant functional deficits with difficulties even with ADLs. She has had extensive non-op management including analgesics, injections of cortisone and viscosupplements, and home exercise program, but remains in significant pain with significant dysfunction.Radiographs show bone on bone arthritis lateral. She presents now for right Total Knee Arthroplasty.    Procedure in detail---   The patient is brought into the operating room and positioned supine on the operating table. After successful administration of  Spinal,   a tourniquet is placed high on the  Right thigh(s) and the lower extremity is prepped and draped in the usual sterile fashion. Time out is performed by the operating team and then the  Right lower extremity is wrapped in Esmarch, knee flexed and the tourniquet inflated to 300 mmHg.       A midline incision is made with a ten blade through the subcutaneous tissue to the level of the extensor mechanism. A fresh blade is used to make a medial parapatellar arthrotomy. Soft tissue over the proximal medial tibia is subperiosteally elevated to the joint line with a knife and into the semimembranosus bursa with a Cobb elevator. Soft tissue over the proximal lateral tibia is elevated with attention being  paid to avoiding the patellar tendon on the tibial tubercle. The patella is everted, knee flexed 90 degrees and the ACL and PCL are removed. Findings are exposed bone lateral and patellofemoral with large lateral and patellar osteophytes.        The drill is used to create a starting hole in the distal femur and the canal is thoroughly irrigated with sterile saline to remove the fatty contents. The 5 degree Right  valgus alignment guide is placed into the femoral canal and the distal femoral cutting block is pinned to remove 10 mm off the distal femur. Resection is made with an oscillating saw.      The tibia is subluxed forward and the menisci are removed. The extramedullary alignment guide is placed referencing proximally at the medial aspect of the tibial tubercle and distally along the second metatarsal axis and tibial crest. The block is pinned to remove 85mm off the more deficient lateral  side. Resection is made with an oscillating saw. Size 2.5is the most appropriate size for the tibia and the proximal tibia is prepared with the modular drill and keel punch for that size.      The femoral sizing guide is placed and size 2.5 is most appropriate. Rotation is marked off the epicondylar axis and confirmed by creating a rectangular flexion gap at 90 degrees. The size 2.5 cutting block is pinned in this rotation and the anterior, posterior and chamfer cuts are made with the oscillating saw. The intercondylar block is then placed and that cut is made.      Trial size 2.5 tibial component, trial size  2.5 posterior stabilized femur and a 12.5  mm posterior stabilized rotating platform insert trial is placed. Full extension is achieved with excellent varus/valgus and anterior/posterior balance throughout full range of motion. The patella is everted and thickness measured to be 22  mm. Free hand resection is taken to 12 mm, a 35 template is placed, lug holes are drilled, trial patella is placed, and it tracks  normally. Osteophytes are removed off the posterior femur with the trial in place. All trials are removed and the cut bone surfaces prepared with pulsatile lavage. Cement is mixed and once ready for implantation, the size 2.5 tibial implant, size  2.5 posterior stabilized femoral component, and the size 35 patella are cemented in place and the patella is held with the clamp. The trial insert is placed and the knee held in full extension. The Exparel (20 ml mixed with 30 ml saline) and .25% Bupivicaine, are injected into the extensor mechanism, posterior capsule, medial and lateral gutters and subcutaneous tissues.  All extruded cement is removed and once the cement is hard the permanent 12.5 mm posterior stabilized rotating platform insert is placed into the tibial tray.      The wound is copiously irrigated with saline solution and the extensor mechanism closed over a hemovac drain with #1 V-loc suture. The tourniquet is released for a total tourniquet time of 28  minutes. Flexion against gravity is 140 degrees and the patella tracks normally. Subcutaneous tissue is closed with 2.0 vicryl and subcuticular with running 4.0 Monocryl. The incision is cleaned and dried and steri-strips and a bulky sterile dressing are applied. The limb is placed into a knee immobilizer and the patient is awakened and transported to recovery in stable condition.      Please note that a surgical assistant was a medical necessity for this procedure in order to perform it in a safe and expeditious manner. Surgical assistant was necessary to retract the ligaments and vital neurovascular structures to prevent injury to them and also necessary for proper positioning of the limb to allow for anatomic placement of the prosthesis.   Dione Plover Leah Bluett, MD    09/05/2015, 9:15 AM

## 2015-09-06 LAB — CBC
HCT: 33.5 % — ABNORMAL LOW (ref 36.0–46.0)
Hemoglobin: 10.5 g/dL — ABNORMAL LOW (ref 12.0–15.0)
MCH: 28.9 pg (ref 26.0–34.0)
MCHC: 31.3 g/dL (ref 30.0–36.0)
MCV: 92.3 fL (ref 78.0–100.0)
Platelets: 352 10*3/uL (ref 150–400)
RBC: 3.63 MIL/uL — ABNORMAL LOW (ref 3.87–5.11)
RDW: 14.4 % (ref 11.5–15.5)
WBC: 14.3 10*3/uL — ABNORMAL HIGH (ref 4.0–10.5)

## 2015-09-06 LAB — BASIC METABOLIC PANEL
Anion gap: 5 (ref 5–15)
BUN: 22 mg/dL — ABNORMAL HIGH (ref 6–20)
CO2: 27 mmol/L (ref 22–32)
Calcium: 8.3 mg/dL — ABNORMAL LOW (ref 8.9–10.3)
Chloride: 106 mmol/L (ref 101–111)
Creatinine, Ser: 0.76 mg/dL (ref 0.44–1.00)
GFR calc Af Amer: >60 mL/min (ref >60)
GFR calc non Af Amer: >60 mL/min (ref >60)
Glucose, Bld: 148 mg/dL — ABNORMAL HIGH (ref 65–99)
Potassium: 3.8 mmol/L (ref 3.5–5.1)
Sodium: 138 mmol/L (ref 135–145)

## 2015-09-06 MED ORDER — METHOCARBAMOL 500 MG PO TABS: 500.0000 mg | ORAL_TABLET | Freq: Four times a day (QID) | ORAL | Status: DC | PRN

## 2015-09-06 MED ORDER — SODIUM CHLORIDE 0.9 % IV BOLUS (SEPSIS)
250.0000 mL | Freq: Once | INTRAVENOUS | Status: AC
  Administered 2015-09-06: 250 mL via INTRAVENOUS

## 2015-09-06 MED ORDER — TRIAMTERENE-HCTZ 37.5-25 MG PO TABS
1.0000 | ORAL_TABLET | Freq: Every day | ORAL | Status: DC
  Administered 2015-09-06: 1 via ORAL
  Filled 2015-09-06 (×2): qty 1

## 2015-09-06 MED ORDER — OXYCODONE HCL 5 MG PO TABS: 5.0000 mg | ORAL_TABLET | ORAL | Status: DC | PRN

## 2015-09-06 MED ORDER — RIVAROXABAN 10 MG PO TABS: 10.0000 mg | ORAL_TABLET | Freq: Every day | ORAL | Status: DC

## 2015-09-06 NOTE — Progress Notes (Signed)
Pt stated that she will call when she is ready for CPAP.  RT to monitor and assess as needed.

## 2015-09-06 NOTE — Care Management Note (Signed)
Case Management Note  Patient Details  Name: [name] MRN: [id number] Date of Birth: October 30, 1956  Subjective/Objective:                   RIGHT TOTAL KNEE ARTHROPLASTY (Right) Action/Plan:  Discharge planning  Expected Discharge Date:  09/07/15               Expected Discharge Plan:  [place]  In-House Referral:     Discharge planning Services  CM Consult  Post Acute Care Choice:  Home Health Choice offered to:  Patient  DME Arranged:  3-N-1, Walker rolling DME Agency:  [place]:  PT [place] Agency:  [place]  Status of Service:  Completed, signed off  Medicare Important Message Given:    Date Medicare IM Given:    Medicare IM give by:    Date Additional Medicare IM Given:    Additional Medicare Important Message give by:     If discussed at [place] of Stay Meetings, dates discussed:    Additional Comments: CM met with pt in room to offer choice of home health agency.  Pt chooses [facility] to render HHPT.  Address and contact information verified by pt.  Referral emailed to [facility] rep, tim.  Cm called [facility] DME rep, Lecretia to please deliver the 3n1 and rolling walker to room prior to discharge.  No other Cm needs were communicated. Dellie Catholic, RN 09/06/2015, 2:43 PM

## 2015-09-06 NOTE — Discharge Instructions (Addendum)
° °Dr. Frank Aluisio °Total Joint Specialist °[facility] °[address] °[place], [place] [address] °[phone] ° °TOTAL KNEE REPLACEMENT POSTOPERATIVE DIRECTIONS ° °Knee Rehabilitation, Guidelines Following Surgery  °Results after knee surgery are often greatly improved when you follow the exercise, range of motion and muscle strengthening exercises prescribed by your doctor. Safety measures are also important to protect the knee from further injury. Any time any of these exercises cause you to have increased pain or swelling in your knee joint, decrease the amount until you are comfortable again and slowly increase them. If you have problems or questions, call your caregiver or physical therapist for advice.  ° °HOME CARE INSTRUCTIONS  °Remove items at home which could result in a fall. This includes throw rugs or furniture in walking pathways.  °· ICE to the affected knee every three hours for 30 minutes at a time and then as needed for pain and swelling.  Continue to use ice on the knee for pain and swelling from surgery. You may notice swelling that will progress down to the foot and ankle.  This is normal after surgery.  Elevate the leg when you are not up walking on it.   °· Continue to use the breathing machine which will help keep your temperature down.  It is common for your temperature to cycle up and down following surgery, especially at night when you are not up moving around and exerting yourself.  The breathing machine keeps your lungs expanded and your temperature down. °· Do not place pillow under knee, focus on keeping the knee straight while resting ° °DIET °You may resume your previous home diet once your are discharged from the hospital. ° °DRESSING / WOUND CARE / SHOWERING °You may shower 3 days after surgery, but keep the wounds dry during showering.  You may use an occlusive plastic wrap (Press'n Seal for example), NO SOAKING/SUBMERGING IN THE BATHTUB.  If the  bandage gets wet, change with a clean dry gauze.  If the incision gets wet, pat the wound dry with a clean towel. °You may start showering once you are discharged home but do not submerge the incision under water. Just pat the incision dry and apply a dry gauze dressing on daily. °Change the surgical dressing daily and reapply a dry dressing each time. ° °ACTIVITY °Walk with your walker as instructed. °Use walker as long as suggested by your caregivers. °Avoid periods of inactivity such as sitting longer than an hour when not asleep. This helps prevent blood clots.  °You may resume a sexual relationship in one month or when given the OK by your doctor.  °You may return to work once you are cleared by your doctor.  °Do not drive a car for 6 weeks or until released by you surgeon.  °Do not drive while taking narcotics. ° °WEIGHT BEARING °Weight bearing as tolerated with assist device (walker, cane, etc) as directed, use it as long as suggested by your surgeon or therapist, typically at least 4-6 weeks. ° °POSTOPERATIVE CONSTIPATION PROTOCOL °Constipation - defined medically as fewer than three stools per week and severe constipation as less than one stool per week. ° °One of the most common issues patients have following surgery is constipation.  Even if you have a regular bowel pattern at home, your normal regimen is likely to be disrupted due to multiple reasons following surgery.  Combination of anesthesia, postoperative narcotics, change in appetite and fluid intake all can affect your bowels.    In order to avoid complications following surgery, here are some recommendations in order to help you during your recovery period. ° °Colace (docusate) - Pick up an over-the-counter form of Colace or another stool softener and take twice a day as long as you are requiring postoperative pain medications.  Take with a full glass of water daily.  If you experience loose stools or diarrhea, hold the colace until you stool forms  back up.  If your symptoms do not get better within 1 week or if they get worse, check with your doctor. ° °Dulcolax (bisacodyl) - Pick up over-the-counter and take as directed by the product packaging as needed to assist with the movement of your bowels.  Take with a full glass of water.  Use this product as needed if not relieved by Colace only.  ° °MiraLax (polyethylene glycol) - Pick up over-the-counter to have on hand.  MiraLax is a solution that will increase the amount of water in your bowels to assist with bowel movements.  Take as directed and can mix with a glass of water, juice, soda, coffee, or tea.  Take if you go more than two days without a movement. °Do not use MiraLax more than once per day. Call your doctor if you are still constipated or irregular after using this medication for 7 days in a row. ° °If you continue to have problems with postoperative constipation, please contact the office for further assistance and recommendations.  If you experience "the worst abdominal pain ever" or develop nausea or vomiting, please contact the office immediatly for further recommendations for treatment. ° °ITCHING ° If you experience itching with your medications, try taking only a single pain pill, or even half a pain pill at a time.  You can also use Benadryl over the counter for itching or also to help with sleep.  ° °TED HOSE STOCKINGS °Wear the elastic stockings on both legs for three weeks following surgery during the day but you may remove then at night for sleeping. ° °MEDICATIONS °See your medication summary on the “After Visit Summary” that the nursing staff will review with you prior to discharge.  You may have some home medications which will be placed on hold until you complete the course of blood thinner medication.  It is important for you to complete the blood thinner medication as prescribed by your surgeon.  Continue your approved medications as instructed at time of  discharge. ° °PRECAUTIONS °If you experience chest pain or shortness of breath - call 911 immediately for transfer to the hospital emergency department.  °If you develop a fever greater that 101 F, purulent drainage from wound, increased redness or drainage from wound, foul odor from the wound/dressing, or calf pain - CONTACT YOUR SURGEON.   °                                                °FOLLOW-UP APPOINTMENTS °Make sure you keep all of your appointments after your operation with your surgeon and caregivers. You should call the office at the above phone number and make an appointment for approximately two weeks after the date of your surgery or on the date instructed by your surgeon outlined in the "After Visit Summary". ° ° °RANGE OF MOTION AND STRENGTHENING EXERCISES  °Rehabilitation of the knee is important following a knee injury or   an operation. After just a few days of immobilization, the muscles of the thigh which control the knee become weakened and shrink (atrophy). Knee exercises are designed to build up the tone and strength of the thigh muscles and to improve knee motion. Often times heat used for twenty to thirty minutes before working out will loosen up your tissues and help with improving the range of motion but do not use heat for the first two weeks following surgery. These exercises can be done on a training (exercise) mat, on the floor, on a table or on a bed. Use what ever works the best and is most comfortable for you Knee exercises include:  Leg Lifts - While your knee is still immobilized in a splint or cast, you can do straight leg raises. Lift the leg to 60 degrees, hold for 3 sec, and slowly lower the leg. Repeat 10-20 times 2-3 times daily. Perform this exercise against resistance later as your knee gets better.  Quad and Hamstring Sets - Tighten up the muscle on the front of the thigh (Quad) and hold for 5-10 sec. Repeat this 10-20 times hourly. Hamstring sets are done by pushing the  foot backward against an object and holding for 5-10 sec. Repeat as with quad sets.   Leg Slides: Lying on your back, slowly slide your foot toward your buttocks, bending your knee up off the floor (only go as far as is comfortable). Then slowly slide your foot back down until your leg is flat on the floor again.  Angel Wings: Lying on your back spread your legs to the side as far apart as you can without causing discomfort.  A rehabilitation program following serious knee injuries can speed recovery and prevent re-injury in the future due to weakened muscles. Contact your doctor or a physical therapist for more information on knee rehabilitation.   IF YOU ARE TRANSFERRED TO A SKILLED REHAB FACILITY If the patient is transferred to a skilled rehab facility following release from the hospital, a list of the current medications will be sent to the facility for the patient to continue.  When discharged from the skilled rehab facility, please have the facility set up the patient's [place] prior to being released. Also, the skilled facility will be responsible for providing the patient with their medications at time of release from the facility to include their pain medication, the muscle relaxants, and their blood thinner medication. If the patient is still at the rehab facility at time of the two week follow up appointment, the skilled rehab facility will also need to assist the patient in arranging follow up appointment in our office and any transportation needs.  MAKE SURE YOU:  Understand these instructions.  Get help right away if you are not doing well or get worse.    Pick up stool softner and laxative for home use following surgery while on pain medications. Do not submerge incision under water. Please use good hand washing techniques while changing dressing each day. May shower starting three days after surgery. Please use a clean towel to pat the incision dry following  showers. Continue to use ice for pain and swelling after surgery. Do not use any lotions or creams on the incision until instructed by your surgeon.  Take Xarelto for two and a half more weeks, then discontinue Xarelto. Once the patient has completed the blood thinner regimen, then take a Baby 81 mg Aspirin daily for three more weeks.  Information on  my medicine - XARELTO (Rivaroxaban)  This medication education was reviewed with me or my healthcare representative as part of my discharge preparation.  The pharmacist that spoke with me during my hospital stay was:  Minda Ditto, [facility]  Why was Xarelto prescribed for you? Xarelto was prescribed for you to reduce the risk of blood clots forming after orthopedic surgery. The medical term for these abnormal blood clots is venous thromboembolism (VTE).  What do you need to know about xarelto ? Take your Xarelto ONCE DAILY at the same time every day. You may take it either with or without food.  If you have difficulty swallowing the tablet whole, you may crush it and mix in applesauce just prior to taking your dose.  Take Xarelto exactly as prescribed by your doctor and DO NOT stop taking Xarelto without talking to the doctor who prescribed the medication.  Stopping without other VTE prevention medication to take the place of Xarelto may increase your risk of developing a clot.  After discharge, you should have regular check-up appointments with your healthcare provider that is prescribing your Xarelto.    What do you do if you miss a dose? If you miss a dose, take it as soon as you remember on the same day then continue your regularly scheduled once daily regimen the next day. Do not take two doses of Xarelto on the same day.   Important Safety Information A possible side effect of Xarelto is bleeding. You should call your healthcare provider right away if you experience any of the following: ? Bleeding from an injury or your nose  that does not stop. ? Unusual colored urine (red or dark brown) or unusual colored stools (red or black). ? Unusual bruising for unknown reasons. ? A serious fall or if you hit your head (even if there is no bleeding).  Some medicines may interact with Xarelto and might increase your risk of bleeding while on Xarelto. To help avoid this, consult your healthcare provider or pharmacist prior to using any new prescription or non-prescription medications, including herbals, vitamins, non-steroidal anti-inflammatory drugs (NSAIDs) and supplements.  We discussed holding Naproxen while on Xarelto  This website has more information on Xarelto: [email or web address]

## 2015-09-06 NOTE — Discharge Summary (Signed)
Physician Discharge Summary   Patient ID: [name] MRN: [id number] DOB/AGE: Jun 18, 1956 59 y.o.  Admit date: 09/05/2015 Discharge date: 09-07-2015  Primary Diagnosis:  Osteoarthritis Right knee(s)  Admission Diagnoses:  Past Medical History  Diagnosis Date  . Fibromyalgia   . GERD (gastroesophageal reflux disease)   . Obstructive sleep apnea     On Cpap 2009  . Anxiety   . Depression   . Restless leg syndrome   . Edema   . Migraine   . Hyperlipidemia   . Anemia     previously followed by Dr. Jamse Arn for anemia and elevated platelets  . Paresthesia     Dr. Everette Rank at [facility]  . Tremor     Dr. Everette Rank  . Gastroparesis     followed at [facility]  . Pneumonia     2012  . Plantar fasciitis 02/2011    R foot  . S/P endometrial ablation 08/09/2006    Novasure Ablation  . C. difficile colitis 10/01/12    treated by [facility]  . Dyssynergia     dyssynergenic defecation, contributing to fecal incontinence.  . Kidney stone   . Pelvic floor dysfunction     pelvic floor dyssynergy  . Neuropathy ([place])   . DDD (degenerative disc disease), lumbar 08/19/14    and facet arthroplasty & left lumbar radiculopathy (Dr.Ramos)  . S/P epidural steroid injection 09/20/14    Dr.Ramos  . PONV (postoperative nausea and vomiting)     pt states has gastroparesis has difficulty taking antibiotics and narcotics has severe nausea and vomiting   . Shortness of breath dyspnea     bending over; exertion   . Urinary frequency   . Urinary incontinence   . Chronic fatigue syndrome   . Lumbar radiculopathy    Discharge Diagnoses:   Principal Problem:   OA (osteoarthritis) of knee  Estimated body mass index is 40.02 kg/(m^2) as calculated from the following:   Height as of this encounter: 5' 2.5" (1.588 m).   Weight as of this encounter: 100.925 kg (222 lb 8 oz).  Procedure:  Procedure(s) (LRB): RIGHT TOTAL KNEE ARTHROPLASTY (Right)   Consults: None  HPI: [name] is a 59 y.o.  year old female with end stage OA of her right knee with progressively worsening pain and dysfunction. She has constant pain, with activity and at rest and significant functional deficits with difficulties even with ADLs. She has had extensive non-op management including analgesics, injections of cortisone and viscosupplements, and home exercise program, but remains in significant pain with significant dysfunction.Radiographs show bone on bone arthritis lateral. She presents now for right Total Knee Arthroplasty.  Laboratory Data: Admission on 09/05/2015  Component Date Value Ref Range Status  . WBC 09/06/2015 14.3* 4.0 - 10.5 K/uL Final  . RBC 09/06/2015 3.63* 3.87 - 5.11 MIL/uL Final  . Hemoglobin 09/06/2015 10.5* 12.0 - 15.0 g/dL Final  . HCT 09/06/2015 33.5* 36.0 - 46.0 % Final  . MCV 09/06/2015 92.3  78.0 - 100.0 fL Final  . MCH 09/06/2015 28.9  26.0 - 34.0 pg Final  . MCHC 09/06/2015 31.3  30.0 - 36.0 g/dL Final  . RDW 09/06/2015 14.4  11.5 - 15.5 % Final  . Platelets 09/06/2015 352  150 - 400 K/uL Final  . Sodium 09/06/2015 138  135 - 145 mmol/L Final  . Potassium 09/06/2015 3.8  3.5 - 5.1 mmol/L Final  . Chloride 09/06/2015 106  101 - 111 mmol/L Final  . CO2 09/06/2015  27  22 - 32 mmol/L Final  . Glucose, Bld 09/06/2015 148* 65 - 99 mg/dL Final  . BUN 09/06/2015 22* 6 - 20 mg/dL Final  . Creatinine, Ser 09/06/2015 0.76  0.44 - 1.00 mg/dL Final  . Calcium 09/06/2015 8.3* 8.9 - 10.3 mg/dL Final  . GFR calc non Af Amer 09/06/2015 >60  >60 mL/min Final  . GFR calc Af Amer 09/06/2015 >60  >60 mL/min Final   Comment: (NOTE) The eGFR has been calculated using the CKD EPI equation. This calculation has not been validated in all clinical situations. eGFR's persistently <60 mL/min signify possible Chronic Kidney Disease.   Georgiann Hahn gap 09/06/2015 5  5 - 15 Final  Hospital Outpatient Visit on 08/25/2015  Component Date Value Ref Range Status  . MRSA, PCR 08/25/2015 NEGATIVE  NEGATIVE  Final  . Staphylococcus aureus 08/25/2015 POSITIVE* NEGATIVE Final   Comment:        The Xpert SA Assay (FDA approved for NASAL specimens in patients over 79 years of age), is one component of a comprehensive surveillance program.  Test performance has been validated by [facility] for patients greater than or equal to 52 year old. It is not intended to diagnose infection nor to guide or monitor treatment.   Marland Kitchen aPTT 08/25/2015 29  24 - 37 seconds Final  . WBC 08/25/2015 10.7* 4.0 - 10.5 K/uL Final  . RBC 08/25/2015 4.33  3.87 - 5.11 MIL/uL Final  . Hemoglobin 08/25/2015 12.5  12.0 - 15.0 g/dL Final  . HCT 08/25/2015 40.5  36.0 - 46.0 % Final  . MCV 08/25/2015 93.5  78.0 - 100.0 fL Final  . MCH 08/25/2015 28.9  26.0 - 34.0 pg Final  . MCHC 08/25/2015 30.9  30.0 - 36.0 g/dL Final  . RDW 08/25/2015 14.8  11.5 - 15.5 % Final  . Platelets 08/25/2015 464* 150 - 400 K/uL Final  . Sodium 08/25/2015 140  135 - 145 mmol/L Final  . Potassium 08/25/2015 4.3  3.5 - 5.1 mmol/L Final  . Chloride 08/25/2015 107  101 - 111 mmol/L Final  . CO2 08/25/2015 30  22 - 32 mmol/L Final  . Glucose, Bld 08/25/2015 96  65 - 99 mg/dL Final  . BUN 08/25/2015 18  6 - 20 mg/dL Final  . Creatinine, Ser 08/25/2015 0.94  0.44 - 1.00 mg/dL Final  . Calcium 08/25/2015 9.0  8.9 - 10.3 mg/dL Final  . Total Protein 08/25/2015 7.2  6.5 - 8.1 g/dL Final  . Albumin 08/25/2015 3.6  3.5 - 5.0 g/dL Final  . AST 08/25/2015 25  15 - 41 U/L Final  . ALT 08/25/2015 24  14 - 54 U/L Final  . Alkaline Phosphatase 08/25/2015 103  38 - 126 U/L Final  . Total Bilirubin 08/25/2015 0.4  0.3 - 1.2 mg/dL Final  . GFR calc non Af Amer 08/25/2015 >60  >60 mL/min Final  . GFR calc Af Amer 08/25/2015 >60  >60 mL/min Final   Comment: (NOTE) The eGFR has been calculated using the CKD EPI equation. This calculation has not been validated in all clinical situations. eGFR's persistently <60 mL/min signify possible Chronic Kidney Disease.     . Anion gap 08/25/2015 3* 5 - 15 Final  . Prothrombin Time 08/25/2015 13.4  11.6 - 15.2 seconds Final  . INR 08/25/2015 1.00  0.00 - 1.49 Final  . ABO/RH(D) 08/25/2015 O POS   Final  . Antibody Screen 08/25/2015 NEG   Final  . Sample Expiration  08/25/2015 09/08/2015   Final  . Color, Urine 08/25/2015 YELLOW  YELLOW Final  . APPearance 08/25/2015 CLEAR  CLEAR Final  . Specific Gravity, Urine 08/25/2015 1.015  1.005 - 1.030 Final  . pH 08/25/2015 7.5  5.0 - 8.0 Final  . Glucose, UA 08/25/2015 NEGATIVE  NEGATIVE mg/dL Final  . Hgb urine dipstick 08/25/2015 NEGATIVE  NEGATIVE Final  . Bilirubin Urine 08/25/2015 NEGATIVE  NEGATIVE Final  . Ketones, ur 08/25/2015 NEGATIVE  NEGATIVE mg/dL Final  . Protein, ur 08/25/2015 NEGATIVE  NEGATIVE mg/dL Final  . Urobilinogen, UA 08/25/2015 0.2  0.0 - 1.0 mg/dL Final  . Nitrite 08/25/2015 NEGATIVE  NEGATIVE Final  . Leukocytes, UA 08/25/2015 NEGATIVE  NEGATIVE Final   MICROSCOPIC NOT DONE ON URINES WITH NEGATIVE PROTEIN, BLOOD, LEUKOCYTES, NITRITE, OR GLUCOSE <1000 mg/dL.  . ABO/RH(D) 08/25/2015 O POS   Final  Appointment on 07/28/2015  Component Date Value Ref Range Status  . Rest HR 07/29/2015 91   Final  . Rest BP 07/29/2015 132/93   Final  . Peak HR 07/29/2015 114   Final  . Peak BP 07/29/2015 136/72   Final  . LV Systolic Volume 35/32/9924 14   Final  . TID 07/29/2015 1.02   Final  . LV Diastolic Volume 26/83/4196 63   Final  . LHR 07/29/2015 0.31   Final  . SSS 07/29/2015 4   Final  . SRS 07/29/2015 0   Final  . SDS 07/29/2015 4   Final  Office Visit on 07/27/2015  Component Date Value Ref Range Status  . Anit Nuclear Antibody(ANA) 07/27/2015 NEG  NEGATIVE Final  . Cyclic Citrullin Peptide Ab 07/27/2015 <2.0  0.0 - 5.0 U/mL Final   Comment:                              Interpretive Table                       Low Positive:  5.1 - 14.9 U/mL                       High Positive:  >= 15.0 U/mL   In addition to the CCP (APCA) result, and  clinical symptoms including joint involvement, the 2010 ACR Classification Criteria for scoring/diagnosing Rheumatoid Arthritis include the results of the following tests: RF (22297), CRP (98921), and ESR (15010). www.rheumatology.org/practice/clinical/classification/ra/ra_2010.asp   . IgG (Immunoglobin G), Serum 07/27/2015 1150  690 - 1700 mg/dL Final  . IgA 07/27/2015 286  69 - 380 mg/dL Final  . IgM, Serum 07/27/2015 144  52 - 322 mg/dL Final  . Total Protein, Serum Electrophores* 07/27/2015 6.5  6.1 - 8.1 g/dL Final  . Albumin ELP 07/27/2015 3.4* 3.8 - 4.8 g/dL Final  . Alpha-1-Globulin 07/27/2015 0.3  0.2 - 0.3 g/dL Final  . Alpha-2-Globulin 07/27/2015 0.8  0.5 - 0.9 g/dL Final  . Beta Globulin 07/27/2015 0.5  0.4 - 0.6 g/dL Final  . Beta 2 07/27/2015 0.5  0.2 - 0.5 g/dL Final  . Gamma Globulin 07/27/2015 1.1  0.8 - 1.7 g/dL Final  . Abnormal Protein Band1 07/27/2015 NOT DET   Final  . SPE Interp. 07/27/2015 SEE NOTE   Final   Comment: Normal pattern with slight decrease in albumin. Results are consistent with SPE performed on 05/03/2011  Reviewed by Odis Hollingshead, MD, PhD, FCAP (Electronic Signature on File)   . COMMENT (PROTEIN  ELECTROPHOR) 07/27/2015 SEE NOTE   Final   Comment: --------------- Serum protein electrophoresis is a useful screening procedure in the detection of various pathophysiologic states such as inflammation, gammopathies, protein loss and other dysproteinemias.  Immunofixation electrophoresis (IFE) is a more sensitive technique for the identification of M-proteins found in patients with monoclonal gammopathy of unknown significance (MGUS), amyloidosis, early or treated myeloma or macroglobulinemia, solitary plasmacytoma or extramedullary plasmacytoma.   . Abnormal Protein Band2 07/27/2015 NOT DET   Final  . Abnormal Protein Band3 07/27/2015 NOT DET   Final  Office Visit on 07/25/2015  Component Date Value Ref Range Status  . Hemoglobin 07/25/2015  12.7  12.0 - 15.0 g/dL Final  . HCT 07/25/2015 38.8  36.0 - 46.0 % Final  . Sed Rate 07/25/2015 67* 0 - 22 mm/hr Final  . TSH 07/25/2015 2.24  0.35 - 4.50 uIU/mL Final  . Free T4 07/25/2015 0.93  0.60 - 1.60 ng/dL Final     X-Rays:No results found.  EKG: Orders placed or performed in visit on 07/25/15  . EKG 12-Lead     Hospital Course: [name] is a 59 y.o. who was admitted to [facility]. They were brought to the operating room on 09/05/2015 and underwent Procedure(s): RIGHT TOTAL KNEE ARTHROPLASTY.  Patient tolerated the procedure well and was later transferred to the recovery room and then to the orthopaedic floor for postoperative care.  They were given PO and IV analgesics for pain control following their surgery.  They were given 24 hours of postoperative antibiotics of  Anti-infectives    Start     Dose/Rate Route Frequency Ordered Stop   09/05/15 1400  ceFAZolin (ANCEF) IVPB 2 g/50 mL premix     2 g 100 mL/hr over 30 Minutes Intravenous Every 6 hours 09/05/15 1208 09/05/15 2023   09/05/15 0640  ceFAZolin (ANCEF) IVPB 2 g/50 mL premix     2 g 100 mL/hr over 30 Minutes Intravenous On call to O.R. 09/05/15 0640 09/05/15 0830     and started on DVT prophylaxis in the form of Xarelto.   PT and OT were ordered for total joint protocol.  Discharge planning consulted to help with postop disposition and equipment needs.  Patient had a decent night on the evening of surgery.  They started to get up OOB with therapy on day one. Hemovac drain was pulled without difficulty.  Continued to work with therapy into day two.  Dressing was changed on day two and the incision was healing well. Patient was seen in rounds and was ready to go home.  Discharge home with home health Diet - Cardiac diet Follow up - in 2 weeks Activity - WBAT Disposition - Home Condition Upon Discharge - Good D/C Meds - See DC Summary DVT Prophylaxis - Xarelto  Discharge Instructions    Call MD / Call  911    Complete by:  As directed   If you experience chest pain or shortness of breath, CALL 911 and be transported to the hospital emergency room.  If you develope a fever above 101 F, pus (white drainage) or increased drainage or redness at the wound, or calf pain, call your surgeon's office.     Change dressing    Complete by:  As directed   Change dressing daily with sterile 4 x 4 inch gauze dressing and apply TED hose. Do not submerge the incision under water.     Constipation Prevention    Complete by:  As  directed   Drink plenty of fluids.  Prune juice may be helpful.  You may use a stool softener, such as Colace (over the counter) 100 mg twice a day.  Use MiraLax (over the counter) for constipation as needed.     Diet - low sodium heart healthy    Complete by:  As directed      Discharge instructions    Complete by:  As directed   Pick up stool softner and laxative for home use following surgery while on pain medications. Do not submerge incision under water. Please use good hand washing techniques while changing dressing each day. May shower starting three days after surgery. Please use a clean towel to pat the incision dry following showers. Continue to use ice for pain and swelling after surgery. Do not use any lotions or creams on the incision until instructed by your surgeon.  Take Xarelto for two and a half more weeks, then discontinue Xarelto. Once the patient has completed the blood thinner regimen, then take a Baby 81 mg Aspirin daily for three more weeks.  Postoperative Constipation Protocol  Constipation - defined medically as fewer than three stools per week and severe constipation as less than one stool per week.  One of the most common issues patients have following surgery is constipation.  Even if you have a regular bowel pattern at home, your normal regimen is likely to be disrupted due to multiple reasons following surgery.  Combination of anesthesia,  postoperative narcotics, change in appetite and fluid intake all can affect your bowels.  In order to avoid complications following surgery, here are some recommendations in order to help you during your recovery period.  Colace (docusate) - Pick up an over-the-counter form of Colace or another stool softener and take twice a day as long as you are requiring postoperative pain medications.  Take with a full glass of water daily.  If you experience loose stools or diarrhea, hold the colace until you stool forms back up.  If your symptoms do not get better within 1 week or if they get worse, check with your doctor.  Dulcolax (bisacodyl) - Pick up over-the-counter and take as directed by the product packaging as needed to assist with the movement of your bowels.  Take with a full glass of water.  Use this product as needed if not relieved by Colace only.   MiraLax (polyethylene glycol) - Pick up over-the-counter to have on hand.  MiraLax is a solution that will increase the amount of water in your bowels to assist with bowel movements.  Take as directed and can mix with a glass of water, juice, soda, coffee, or tea.  Take if you go more than two days without a movement. Do not use MiraLax more than once per day. Call your doctor if you are still constipated or irregular after using this medication for 7 days in a row.  If you continue to have problems with postoperative constipation, please contact the office for further assistance and recommendations.  If you experience "the worst abdominal pain ever" or develop nausea or vomiting, please contact the office immediatly for further recommendations for treatment.     Do not put a pillow under the knee. Place it under the heel.    Complete by:  As directed      Do not sit on low chairs, stoools or toilet seats, as it may be difficult to get up from low surfaces    Complete by:  As directed      Driving restrictions    Complete by:  As directed   No driving  until released by the physician.     Increase activity slowly as tolerated    Complete by:  As directed      Lifting restrictions    Complete by:  As directed   No lifting until released by the physician.     Patient may shower    Complete by:  As directed   You may shower without a dressing once there is no drainage.  Do not wash over the wound.  If drainage remains, do not shower until drainage stops.     TED hose    Complete by:  As directed   Use stockings (TED hose) for 3 weeks on both leg(s).  You may remove them at night for sleeping.     Weight bearing as tolerated    Complete by:  As directed   Laterality:  right  Extremity:  Lower            Medication List    STOP taking these medications        estradiol 0.05 MG/24HR patch  Commonly known as:  VIVELLE-DOT     ICAPS PO     Melatonin 5 MG Caps     multivitamin with minerals Tabs tablet     naproxen sodium 220 MG tablet  Commonly known as:  ANAPROX     progesterone 100 MG capsule  Commonly known as:  PROMETRIUM      TAKE these medications        acetaminophen 500 MG tablet  Commonly known as:  TYLENOL  Take 1,000 mg by mouth every 6 (six) hours as needed for mild pain.     ALPRAZolam 0.25 MG tablet  Commonly known as:  XANAX  Take 0.25 mg by mouth 3 (three) times daily as needed for anxiety. For anxiety     ARIPiprazole 5 MG tablet  Commonly known as:  ABILIFY  Take 5 mg by mouth every morning. Nausea & anxiety     atorvastatin 40 MG tablet  Commonly known as:  LIPITOR  TAKE 1 TABLET (40 MG TOTAL) BY MOUTH DAILY.     cetirizine 10 MG tablet  Commonly known as:  ZYRTEC  Take 10 mg by mouth at bedtime.     clonazePAM 0.5 MG tablet  Commonly known as:  KLONOPIN  Take 0.5 mg by mouth at bedtime.     DULoxetine 60 MG capsule  Commonly known as:  CYMBALTA  Take 60 mg by mouth 2 (two) times daily.     fluticasone 50 MCG/ACT nasal spray  Commonly known as:  FLONASE  Place 2 sprays into both  nostrils daily.     LYRICA 75 MG capsule  Generic drug:  pregabalin  Take 75 mg by mouth 2 (two) times daily.     methocarbamol 500 MG tablet  Commonly known as:  ROBAXIN  Take 1 tablet (500 mg total) by mouth every 6 (six) hours as needed for muscle spasms.     metoprolol 50 MG tablet  Commonly known as:  LOPRESSOR  Take 1 tablet (50 mg total) by mouth 2 (two) times daily.     nortriptyline 25 MG capsule  Commonly known as:  PAMELOR  Take 100 mg by mouth at bedtime.     NUVIGIL 250 MG tablet  Generic drug:  Armodafinil  Take 250 mg by mouth every morning.  ondansetron 8 MG tablet  Commonly known as:  ZOFRAN  Take by mouth every 8 (eight) hours as needed for nausea or vomiting.     oxyCODONE 5 MG immediate release tablet  Commonly known as:  Oxy IR/ROXICODONE  Take 1-2 tablets (5-10 mg total) by mouth every 3 (three) hours as needed for moderate pain, severe pain or breakthrough pain.     pantoprazole 40 MG tablet  Commonly known as:  PROTONIX  Take 40 mg by mouth 2 (two) times daily.     PHILLIPS COLON HEALTH Caps  Take 1 capsule by mouth daily.     promethazine 25 MG suppository  Commonly known as:  PHENERGAN  Place 25 mg rectally every 6 (six) hours as needed for nausea or vomiting.     rivaroxaban 10 MG Tabs tablet  Commonly known as:  XARELTO  Take 1 tablet (10 mg total) by mouth daily with breakfast. Take Xarelto for two and a half more weeks, then discontinue Xarelto. Once the patient has completed the blood thinner regimen, then take a Baby 81 mg Aspirin daily for three more weeks.     SUMAtriptan 50 MG tablet  Commonly known as:  IMITREX  Take 50 mg by mouth every 2 (two) hours as needed for migraine. May repeat in 2 hours if headache persists or recurs.     topiramate 25 MG tablet  Commonly known as:  TOPAMAX  Take 50 mg by mouth at bedtime.     triamterene-hydrochlorothiazide 37.5-25 MG capsule  Commonly known as:  DYAZIDE  TAKE ONE CAPSULE BY  MOUTH EVERY DAY           Follow-up Information    Follow up with [name], MD. Schedule an appointment as soon as possible for a visit on 09/20/2015.   Specialty:  Orthopedic Surgery   Why:  Call office at [phone] to setup appointment with Dr. Wynelle Link on Tuesday 09/20/2015.   Contact information:   [address] [place] [address] [phone]       Signed: Arlee Muslim, PA-C Orthopaedic Surgery 09/06/2015, 9:43 AM

## 2015-09-06 NOTE — Evaluation (Signed)
Occupational Therapy Evaluation Patient Details Name: [name] MRN: [id number] DOB: 03-22-1956 Today's Date: 09/06/2015    History of Present Illness R TKA   Clinical Impression   This 59 year old female was admitted for the above surgery.  She will benefit from continued OT in acute to educate on bathroom transfers. Goals in acute are for min guard level.  Pt performed only SPT this am due to pain    Follow Up Recommendations  No OT follow up;Supervision/Assistance - 24 hour    Equipment Recommendations   (likely none:  has 3:1, possibly no bucket)    Recommendations for Other Services       Precautions / Restrictions Precautions Precautions: Fall;Knee Required Braces or Orthoses: Knee Immobilizer - Right Knee Immobilizer - Right: Discontinue once straight leg raise with < 10 degree lag Restrictions Weight Bearing Restrictions: No      Mobility Bed Mobility         Supine to sit: Min assist     General bed mobility comments: assist for RLE  Transfers   Equipment used: Rolling walker (2 wheeled) Transfers: Sit to/from Stand Sit to Stand: Min assist         General transfer comment: cues for hand and R leg position    Balance                                            ADL Overall ADL's : Needs assistance/impaired     Grooming: Wash/dry hands;Wash/dry face;Sitting;Set up   Upper Body Bathing: Set up;Sitting   Lower Body Bathing: Moderate assistance;Sit to/from stand   Upper Body Dressing : Set up;Sitting   Lower Body Dressing: Moderate assistance;Sit to/from stand   Toilet Transfer: Minimal assistance;Stand-pivot;RW (recliner)   Toileting- Clothing Manipulation and Hygiene: Minimal assistance;Sit to/from stand         General ADL Comments: performed ADL from EOB and transferred to chair.  She has catheter in place     Vision     Perception     Praxis      Pertinent Vitals/Pain Pain Assessment: 0-10 Pain  Score: 6  (to 10 with weight bearing) Pain Location: R knee Pain Descriptors / Indicators: Aching Pain Intervention(s): Limited activity within patient's tolerance;Monitored during session;Premedicated before session;Repositioned;Patient requesting pain meds-RN notified;Ice applied     Hand Dominance     Extremity/Trunk Assessment Upper Extremity Assessment Upper Extremity Assessment: Overall WFL for tasks assessed           Communication Communication Communication: No difficulties   Cognition Arousal/Alertness: Awake/alert Behavior During Therapy: WFL for tasks assessed/performed Overall Cognitive Status: Within Functional Limits for tasks assessed                     General Comments       Exercises       Shoulder Instructions      Home Living Family/patient expects to be discharged to:: Private residence Living Arrangements: Spouse/significant other Available Help at Discharge: Family               Bathroom Shower/Tub: Walk-in Human resources officer: Standard         Additional Comments: has 3;1 from father but no bucket      Prior Functioning/Environment Level of Independence: Independent             OT  Diagnosis: Acute pain   OT Problem List: Decreased strength;Decreased activity tolerance;Decreased knowledge of use of DME or AE;Pain   OT Treatment/Interventions: Self-care/ADL training;DME and/or AE instruction;Patient/family education    OT Goals(Current goals can be found in the care plan section) Acute Rehab OT Goals Patient Stated Goal: less pain OT Goal Formulation: With patient Time For Goal Achievement: 09/13/15 Potential to Achieve Goals: Good ADL Goals Pt Will Perform Grooming: with supervision;standing Pt Will Transfer to Toilet: with min guard assist;ambulating;bedside commode Pt Will Perform Toileting - Clothing Manipulation and hygiene: with min guard assist;sit to/from stand Pt Will Perform Tub/Shower Transfer:  Shower transfer;with min guard assist;ambulating;3 in 1  OT Frequency: Min 2X/week   Barriers to D/C:            Co-evaluation              End of Session    Activity Tolerance: Patient tolerated treatment well Patient left: in chair;with call bell/phone within reach   Time: 0743-0815 OT Time Calculation (min): 32 min Charges:  OT General Charges $OT Visit: 1 Procedure OT Evaluation $Initial OT Evaluation Tier I: 1 Procedure OT Treatments $Self Care/Home Management : 8-22 mins G-Codes:    Torrey Horseman 09/30/15, 8:31 AM Marica Otter, OTR/L [phone] 09-30-15

## 2015-09-06 NOTE — Plan of Care (Signed)
Problem: Consults Goal: Diagnosis- Total Joint Replacement Outcome: Completed/Met Date Met:  09/06/15 Primary Total Knee RIGHT

## 2015-09-06 NOTE — Progress Notes (Signed)
Pt placed on Auto BIPAP 20/8 with 3 LPM o2 bleed in via nasal pillow.  Pt tolerating well at this time, RT to monitor and assess as needed.

## 2015-09-06 NOTE — Progress Notes (Signed)
   09/06/15 1400  PT Visit Information  Last PT Received On 09/06/15  Assistance Needed +1  History of Present Illness R TKA  PT Time Calculation  PT Start Time (ACUTE ONLY) 1407  PT Stop Time (ACUTE ONLY) 1432  PT Time Calculation (min) (ACUTE ONLY) 25 min  Subjective Data  Patient Stated Goal less pain  Precautions  Precautions Fall;Knee  Required Braces or Orthoses Knee Immobilizer - Right  Knee Immobilizer - Right Discontinue once straight leg raise with < 10 degree lag  Restrictions  Other Position/Activity Restrictions WBAT  Pain Assessment  Pain Assessment 0-10  Pain Score 6  Pain Location R knee  Pain Descriptors / Indicators Grimacing;Guarding  Pain Intervention(s) Limited activity within patient's tolerance;Monitored during session;Ice applied  Cognition  Arousal/Alertness Awake/alert  Behavior During Therapy WFL for tasks assessed/performed  Overall Cognitive Status Within Functional Limits for tasks assessed  Bed Mobility  Overal bed mobility Needs Assistance  Bed Mobility Supine to Sit;Sit to Supine  Supine to sit Supervision  Sit to supine Supervision  General bed mobility comments assist for RLE  Transfers  Overall transfer level Needs assistance  Equipment used Rolling walker (2 wheeled)  Transfers Sit to/from Stand  Sit to Stand Min guard  General transfer comment cues for hands and R leg position  Ambulation/Gait  Ambulation/Gait assistance Supervision;Min guard  Ambulation Distance (Feet) 80 Feet  Assistive device Rolling walker (2 wheeled)  Gait Pattern/deviations Step-to pattern  General Gait Details cues for sequence, RW position and  posture.  Total Joint Exercises  Quad Sets AROM;Both;10 reps  Heel Slides AAROM;Right;10 reps  Hip ABduction/ADduction AAROM;AROM;Right;10 reps  Straight Leg Raises AAROM;AROM;10 reps;Right  Short Arc Quad AROM;Strengthening;Right;10 reps  PT - End of Session  Equipment Utilized During Treatment Gait belt;Right  knee immobilizer  Activity Tolerance Patient tolerated treatment well  Patient left in bed;with call bell/phone within reach;with family/visitor present  Nurse Communication Mobility status  PT - Assessment/Plan  PT Plan Current plan remains appropriate  PT Frequency (ACUTE ONLY) 7X/week  Follow Up Recommendations Home health PT;Supervision/Assistance - 24 hour  PT equipment None recommended by PT  PT Goal Progression  Progress towards PT goals Progressing toward goals  Acute Rehab PT Goals  PT Goal Formulation With patient/family  Time For Goal Achievement 09/09/15  Potential to Achieve Goals Good  PT General Charges  $$ ACUTE PT VISIT 1 Procedure  PT Treatments  $Gait Training 8-22 mins  $Therapeutic Exercise 8-22 mins

## 2015-09-06 NOTE — Progress Notes (Signed)
Physical Therapy Treatment Patient Details Name: [name] MRN: [id number] DOB: 1956/08/21 Today's Date: 09/06/2015    History of Present Illness R TKA    PT Comments    Pt progressing well today; more pain after amb, will complete further ex's in pm  Follow Up Recommendations  Home health PT;Supervision/Assistance - 24 hour     Equipment Recommendations  None recommended by PT    Recommendations for Other Services       Precautions / Restrictions Precautions Precautions: Fall;Knee Required Braces or Orthoses: Knee Immobilizer - Right Knee Immobilizer - Right: Discontinue once straight leg raise with < 10 degree lag Restrictions Weight Bearing Restrictions: No Other Position/Activity Restrictions: WBAT    Mobility  Bed Mobility Overal bed mobility: Needs Assistance Bed Mobility: Sit to Supine     Supine to sit: Min assist Sit to supine: Min assist   General bed mobility comments: assist for RLE  Transfers Overall transfer level: Needs assistance Equipment used: Rolling walker (2 wheeled) Transfers: Sit to/from Stand Sit to Stand: Min guard         General transfer comment: cues for hands and R leg position  Ambulation/Gait Ambulation/Gait assistance: Min guard Ambulation Distance (Feet): 75 Feet (10' more) Assistive device: Rolling walker (2 wheeled) Gait Pattern/deviations: Step-to pattern;Antalgic;Trunk flexed     General Gait Details: cues for sequence, RW position and  posture.   Stairs            Wheelchair Mobility    Modified Rankin (Stroke Patients Only)       Balance                                    Cognition Arousal/Alertness: Awake/alert Behavior During Therapy: WFL for tasks assessed/performed Overall Cognitive Status: Within Functional Limits for tasks assessed                      Exercises Total Joint Exercises Ankle Circles/Pumps: AROM;Both;10 reps Quad Sets: AROM;Both;10  reps Straight Leg Raises: AAROM;Right;10 reps    General Comments        Pertinent Vitals/Pain Pain Assessment: 0-10 Pain Score: 5  Pain Location: R knee Pain Descriptors / Indicators: Discomfort;Grimacing;Guarding Pain Intervention(s): Limited activity within patient's tolerance;Monitored during session;Premedicated before session;Repositioned;Ice applied    Home Living Family/patient expects to be discharged to:: Private residence Living Arrangements: Spouse/significant other Available Help at Discharge: Family           Additional Comments: has 3;1 from father but no bucket    Prior Function Level of Independence: Independent          PT Goals (current goals can now be found in the care plan section) Acute Rehab PT Goals Patient Stated Goal: less pain PT Goal Formulation: With patient/family Time For Goal Achievement: 09/09/15 Potential to Achieve Goals: Good Progress towards PT goals: Progressing toward goals    Frequency  7X/week    PT Plan Current plan remains appropriate    Co-evaluation             End of Session Equipment Utilized During Treatment: Gait belt;Right knee immobilizer Activity Tolerance: Patient tolerated treatment well Patient left: in bed;with call bell/phone within reach;with family/visitor present     Time: 9562-1308 PT Time Calculation (min) (ACUTE ONLY): 32 min  Charges:  $Gait Training: 8-22 mins $Therapeutic Activity: 8-22 mins  G CodesKenyon Ana 09/06/2015, 11:30 AM

## 2015-09-06 NOTE — Progress Notes (Signed)
Subjective: 1 Day Post-Op Procedure(s) (LRB): RIGHT TOTAL KNEE ARTHROPLASTY (Right) Patient reports pain as mild.   Patient seen in rounds with Dr. Wynelle Link. Patient is well, but has had some minor complaints of pain in the knee, requiring pain medications We will start therapy today.  Plan is to go Home after hospital stay.  Objective: Vital signs in last 24 hours: Temp:  [97.4 F (36.3 C)-98.4 F (36.9 C)] 97.7 F (36.5 C) (10/11 0525) Pulse Rate:  [59-77] 68 (10/11 0525) Resp:  [10-16] 16 (10/11 0525) BP: (95-130)/(50-71) 95/55 mmHg (10/11 0525) SpO2:  [96 %-100 %] 98 % (10/11 0525)  Intake/Output from previous day:  Intake/Output Summary (Last 24 hours) at 09/06/15 0818 Last data filed at 09/06/15 0637  Gross per 24 hour  Intake 4619.5 ml  Output    932 ml  Net 3687.5 ml    Intake/Output this shift: UOP 150 since around [place] (soft pressures)  Labs:  Recent Labs  09/06/15 0446  HGB 10.5*    Recent Labs  09/06/15 0446  WBC 14.3*  RBC 3.63*  HCT 33.5*  PLT 352    Recent Labs  09/06/15 0446  NA 138  K 3.8  CL 106  CO2 27  BUN 22*  CREATININE 0.76  GLUCOSE 148*  CALCIUM 8.3*   No results for input(s): LABPT, INR in the last 72 hours.  EXAM General - Patient is Alert, Appropriate and Oriented Extremity - Neurovascular intact Sensation intact distally Dorsiflexion/Plantar flexion intact Dressing - dressing C/D/I Motor Function - intact, moving foot and toes well on exam.  Hemovac pulled without difficulty.  Past Medical History  Diagnosis Date  . Fibromyalgia   . GERD (gastroesophageal reflux disease)   . Obstructive sleep apnea     On Cpap 2009  . Anxiety   . Depression   . Restless leg syndrome   . Edema   . Migraine   . Hyperlipidemia   . Anemia     previously followed by Dr. Jamse Arn for anemia and elevated platelets  . Paresthesia     Dr. Everette Rank at [facility]  . Tremor     Dr. Everette Rank  . Gastroparesis     followed at  [facility]  . Pneumonia     2012  . Plantar fasciitis 02/2011    R foot  . S/P endometrial ablation 08/09/2006    Novasure Ablation  . C. difficile colitis 10/01/12    treated by [facility]  . Dyssynergia     dyssynergenic defecation, contributing to fecal incontinence.  . Kidney stone   . Pelvic floor dysfunction     pelvic floor dyssynergy  . Neuropathy ([place])   . DDD (degenerative disc disease), lumbar 08/19/14    and facet arthroplasty & left lumbar radiculopathy (Dr.Ramos)  . S/P epidural steroid injection 09/20/14    Dr.Ramos  . PONV (postoperative nausea and vomiting)     pt states has gastroparesis has difficulty taking antibiotics and narcotics has severe nausea and vomiting   . Shortness of breath dyspnea     bending over; exertion   . Urinary frequency   . Urinary incontinence   . Chronic fatigue syndrome   . Lumbar radiculopathy     Assessment/Plan: 1 Day Post-Op Procedure(s) (LRB): RIGHT TOTAL KNEE ARTHROPLASTY (Right) Principal Problem:   OA (osteoarthritis) of knee  Estimated body mass index is 40.02 kg/(m^2) as calculated from the following:   Height as of this encounter: 5' 2.5" (1.588 m).   Weight  as of this encounter: 100.925 kg (222 lb 8 oz). Advance diet Up with therapy Plan for discharge tomorrow Discharge home with home health  DVT Prophylaxis - Xarelto Weight-Bearing as tolerated to right leg D/C O2 and Pulse OX and try on Room Air  Arlee Muslim, PA-C Orthopaedic Surgery 09/06/2015, 8:18 AM

## 2015-09-07 LAB — CBC
HCT: 34.5 % — ABNORMAL LOW (ref 36.0–46.0)
Hemoglobin: 11 g/dL — ABNORMAL LOW (ref 12.0–15.0)
MCH: 29.1 pg (ref 26.0–34.0)
MCHC: 31.9 g/dL (ref 30.0–36.0)
MCV: 91.3 fL (ref 78.0–100.0)
Platelets: 414 10*3/uL — ABNORMAL HIGH (ref 150–400)
RBC: 3.78 MIL/uL — ABNORMAL LOW (ref 3.87–5.11)
RDW: 14.7 % (ref 11.5–15.5)
WBC: 15.4 10*3/uL — ABNORMAL HIGH (ref 4.0–10.5)

## 2015-09-07 LAB — BASIC METABOLIC PANEL
Anion gap: 8 (ref 5–15)
BUN: 20 mg/dL (ref 6–20)
CO2: 29 mmol/L (ref 22–32)
Calcium: 8.8 mg/dL — ABNORMAL LOW (ref 8.9–10.3)
Chloride: 102 mmol/L (ref 101–111)
Creatinine, Ser: 0.72 mg/dL (ref 0.44–1.00)
GFR calc Af Amer: >60 mL/min (ref >60)
GFR calc non Af Amer: >60 mL/min (ref >60)
Glucose, Bld: 107 mg/dL — ABNORMAL HIGH (ref 65–99)
Potassium: 3.6 mmol/L (ref 3.5–5.1)
Sodium: 139 mmol/L (ref 135–145)

## 2015-09-07 NOTE — Progress Notes (Signed)
Subjective: 2 Days Post-Op Procedure(s) (LRB): RIGHT TOTAL KNEE ARTHROPLASTY (Right) Patient reports pain as mild.   Patient seen in rounds by Dr. Wynelle Link. Patient is well, but has had some minor complaints of pain in the knee, requiring pain medications Patient is ready to go home  Objective: Vital signs in last 24 hours: Temp:  [97.6 F (36.4 C)-98.6 F (37 C)] 97.6 F (36.4 C) (10/12 0429) Pulse Rate:  [72-79] 72 (10/12 0429) Resp:  [14-16] 16 (10/12 0429) BP: (121-135)/(55-71) 127/61 mmHg (10/12 0429) SpO2:  [94 %-100 %] 100 % (10/12 0429)  Intake/Output from previous day:  Intake/Output Summary (Last 24 hours) at 09/07/15 0734 Last data filed at 09/07/15 0429  Gross per 24 hour  Intake 2393.75 ml  Output    450 ml  Net 1943.75 ml    Labs:  Recent Labs  09/06/15 0446 09/07/15 0543  HGB 10.5* 11.0*    Recent Labs  09/06/15 0446 09/07/15 0543  WBC 14.3* 15.4*  RBC 3.63* 3.78*  HCT 33.5* 34.5*  PLT 352 414*    Recent Labs  09/06/15 0446 09/07/15 0543  NA 138 139  K 3.8 3.6  CL 106 102  CO2 27 29  BUN 22* 20  CREATININE 0.76 0.72  GLUCOSE 148* 107*  CALCIUM 8.3* 8.8*   No results for input(s): LABPT, INR in the last 72 hours.  EXAM: General - Patient is Alert, Appropriate and Oriented Extremity - Neurovascular intact Sensation intact distally Dorsiflexion/Plantar flexion intact Incision - clean, dry, no drainage Motor Function - intact, moving foot and toes well on exam.   Assessment/Plan: 2 Days Post-Op Procedure(s) (LRB): RIGHT TOTAL KNEE ARTHROPLASTY (Right) Procedure(s) (LRB): RIGHT TOTAL KNEE ARTHROPLASTY (Right) Past Medical History  Diagnosis Date  . Fibromyalgia   . GERD (gastroesophageal reflux disease)   . Obstructive sleep apnea     On Cpap 2009  . Anxiety   . Depression   . Restless leg syndrome   . Edema   . Migraine   . Hyperlipidemia   . Anemia     previously followed by Dr. Jamse Arn for anemia and elevated  platelets  . Paresthesia     Dr. Everette Rank at [facility]  . Tremor     Dr. Everette Rank  . Gastroparesis     followed at [facility]  . Pneumonia     2012  . Plantar fasciitis 02/2011    R foot  . S/P endometrial ablation 08/09/2006    Novasure Ablation  . C. difficile colitis 10/01/12    treated by [facility]  . Dyssynergia     dyssynergenic defecation, contributing to fecal incontinence.  . Kidney stone   . Pelvic floor dysfunction     pelvic floor dyssynergy  . Neuropathy ([place])   . DDD (degenerative disc disease), lumbar 08/19/14    and facet arthroplasty & left lumbar radiculopathy (Dr.Ramos)  . S/P epidural steroid injection 09/20/14    Dr.Ramos  . PONV (postoperative nausea and vomiting)     pt states has gastroparesis has difficulty taking antibiotics and narcotics has severe nausea and vomiting   . Shortness of breath dyspnea     bending over; exertion   . Urinary frequency   . Urinary incontinence   . Chronic fatigue syndrome   . Lumbar radiculopathy    Principal Problem:   OA (osteoarthritis) of knee  Estimated body mass index is 40.02 kg/(m^2) as calculated from the following:   Height as of this encounter: 5' 2.5" (1.588  m).   Weight as of this encounter: 100.925 kg (222 lb 8 oz). Up with therapy Discharge home with home health Diet - Cardiac diet Follow up - in 2 weeks Activity - WBAT Disposition - Home Condition Upon Discharge - Good D/C Meds - See DC Summary DVT Prophylaxis - Xarelto  Arlee Muslim, PA-C Orthopaedic Surgery 09/07/2015, 7:34 AM

## 2015-09-07 NOTE — Progress Notes (Signed)
Occupational Therapy Treatment Patient Details Name: [name] MRN: [id number] DOB: [birth year] Today's Date: 09/07/2015    History of present illness R TKA   OT comments  Pt performed bathroom transfers and felt lightheaded at end of session. Reviewed education with husband when he arrived.  Follow Up Recommendations  No OT follow up;Supervision/Assistance - 24 hour    Equipment Recommendations  None recommended by OT    Recommendations for Other Services      Precautions / Restrictions Precautions Precautions: Fall;Knee Required Braces or Orthoses: Knee Immobilizer - Right Knee Immobilizer - Right: Discontinue once straight leg raise with < 10 degree lag Restrictions Weight Bearing Restrictions: No Other Position/Activity Restrictions: WBAT       Mobility Bed Mobility         Supine to sit: Min assist Sit to supine: Min assist   General bed mobility comments: assistance for RLE  Transfers   Equipment used: Rolling walker (2 wheeled) Transfers: Sit to/from [employer] to Stand: Min guard Stand pivot transfers: Min guard       General transfer comment: cues for UE and LE.    Balance                                   ADL                           Toilet Transfer: Min guard;BSC;RW;Ambulation   Toileting- Clothing Manipulation and Hygiene: Minimal assistance;Sit to/from stand   Tub/ Banker: Walk-in shower;Ambulation     General ADL Comments: practiced bathroom transfers.  When performing hygiene, pt had posterior LOB--needed min A to regain balance.  when pt sitting in chair, c/o lightheadedness.  BP 110/60.  Feeling did not resolve in 5 minutes, so transferred to bed.  Husband came in [place].  Educated on donning KI, guarding, shower transfer sequence and ADLs.  He plans to get her a long netted sponge      Vision                     Perception     Praxis      Cognition    Behavior During Therapy: WFL for tasks assessed/performed Overall Cognitive Status: Within Functional Limits for tasks assessed                       Extremity/Trunk Assessment               Exercises     Shoulder Instructions       General Comments      Pertinent Vitals/ Pain       Pain Score: 5  Pain Location: R knee Pain Descriptors / Indicators: Sore Pain Intervention(s): Limited activity within patient's tolerance;Monitored during session;Repositioned;Premedicated before session;Ice applied  Home Living                                          Prior Functioning/Environment              Frequency       Progress Toward Goals  OT Goals(current goals can now be found in the care plan section)   progressing:  Met 2/4 goals and verbalizes understanding of all education  Occupational Therapy Treatment Patient Details Name: [name] MRN: [id number] DOB: [birth year] Today's Date: 09/07/2015    History of present illness R TKA   OT comments  Pt performed bathroom transfers and felt lightheaded at end of session. Reviewed education with husband when he arrived.  Follow Up Recommendations  No OT follow up;Supervision/Assistance - 24 hour    Equipment Recommendations  None recommended by OT    Recommendations for Other Services      Precautions / Restrictions Precautions Precautions: Fall;Knee Required Braces or Orthoses: Knee Immobilizer - Right Knee Immobilizer - Right: Discontinue once straight leg raise with < 10 degree lag Restrictions Weight Bearing Restrictions: No Other Position/Activity Restrictions: WBAT       Mobility Bed Mobility         Supine to sit: Min assist Sit to supine: Min assist   General bed mobility comments: assistance for RLE  Transfers   Equipment used: Rolling walker (2 wheeled) Transfers: Sit to/from [employer] to Stand: Min guard Stand pivot transfers: Min guard       General transfer comment: cues for UE and LE.    Balance                                   ADL                           Toilet Transfer: Min guard;BSC;RW;Ambulation   Toileting- Clothing Manipulation and Hygiene: Minimal assistance;Sit to/from stand   Tub/ Banker: Walk-in shower;Ambulation     General ADL Comments: practiced bathroom transfers.  When performing hygiene, pt had posterior LOB--needed min A to regain balance.  when pt sitting in chair, c/o lightheadedness.  BP 110/60.  Feeling did not resolve in 5 minutes, so transferred to bed.  Husband came in [place].  Educated on donning KI, guarding, shower transfer sequence and ADLs.  He plans to get her a long netted sponge      Vision                     Perception     Praxis      Cognition    Behavior During Therapy: WFL for tasks assessed/performed Overall Cognitive Status: Within Functional Limits for tasks assessed                       Extremity/Trunk Assessment               Exercises     Shoulder Instructions       General Comments      Pertinent Vitals/ Pain       Pain Score: 5  Pain Location: R knee Pain Descriptors / Indicators: Sore Pain Intervention(s): Limited activity within patient's tolerance;Monitored during session;Repositioned;Premedicated before session;Ice applied  Home Living                                          Prior Functioning/Environment              Frequency       Progress Toward Goals  OT Goals(current goals can now be found in the care plan section)   progressing:  Met 2/4 goals and verbalizes understanding of all education

## 2015-09-07 NOTE — Progress Notes (Signed)
Physical Therapy Treatment Patient Details Name: [name] MRN: [id number] DOB: 07-Nov-1956 Today's Date: 09/07/2015    History of Present Illness R TKA    PT Comments    Progressing well, slightly sleepy d/t meds but arouses easily for participation with PT  Follow Up Recommendations  Home health PT;Supervision/Assistance - 24 hour;Supervision for mobility/OOB     Equipment Recommendations  None recommended by PT    Recommendations for Other Services       Precautions / Restrictions Precautions Precautions: Fall;Knee Required Braces or Orthoses: Knee Immobilizer - Right Knee Immobilizer - Right: Discontinue once straight leg raise with < 10 degree lag Restrictions Weight Bearing Restrictions: No Other Position/Activity Restrictions: WBAT    Mobility  Bed Mobility Overal bed mobility: Needs Assistance Bed Mobility: Supine to Sit;Sit to Supine     Supine to sit: Min assist Sit to supine: Min assist   General bed mobility comments: assistance for RLE  Transfers Overall transfer level: Needs assistance Equipment used: Rolling walker (2 wheeled) Transfers: Sit to/from Stand Sit to Stand: Min guard Stand pivot transfers: Min guard       General transfer comment: cues for UE and LE.  Ambulation/Gait Ambulation/Gait assistance: Min guard Ambulation Distance (Feet): 100 Feet Assistive device: Rolling walker (2 wheeled) Gait Pattern/deviations: Step-to pattern;Antalgic     General Gait Details: cues for sequence, RW position and  posture, pt self correcting end of session   Stairs Stairs: Yes Stairs assistance: Min assist Stair Management: No rails;Backwards;Step to pattern;With walker Number of Stairs: 1 (x2) General stair comments: cues for sequence and technique, advised to use same technique for stair getting into bed high bed  Wheelchair Mobility    Modified Rankin (Stroke Patients Only)       Balance                                    Cognition Arousal/Alertness: Awake/alert Behavior During Therapy: WFL for tasks assessed/performed Overall Cognitive Status: Within Functional Limits for tasks assessed                      Exercises Total Joint Exercises Ankle Circles/Pumps: AROM;Both;10 reps Quad Sets: AROM;Both;10 reps Short Arc Quad: AROM;Strengthening;Right;10 reps Heel Slides: AAROM;Right;10 reps Hip ABduction/ADduction: AAROM;AROM;Right;10 reps Straight Leg Raises: AAROM;AROM;10 reps;Right    General Comments        Pertinent Vitals/Pain Pain Assessment: 0-10 Pain Score: 4  Pain Location: R knee Pain Descriptors / Indicators: Sore Pain Intervention(s): Limited activity within patient's tolerance;Monitored during session;Premedicated before session;Repositioned;Ice applied    Home Living                      Prior Function            PT Goals (current goals can now be found in the care plan section) Acute Rehab PT Goals PT Goal Formulation: With patient/family Time For Goal Achievement: 09/09/15 Potential to Achieve Goals: Good Progress towards PT goals: Progressing toward goals    Frequency  7X/week    PT Plan Current plan remains appropriate    Co-evaluation             End of Session Equipment Utilized During Treatment: Gait belt;Right knee immobilizer Activity Tolerance: Patient tolerated treatment well Patient left: in bed;with call bell/phone within reach;with family/visitor present     Time: 3474-2595 PT Time Calculation (min) (ACUTE ONLY): 45  min  Charges:  $Gait Training: 8-22 mins $Therapeutic Exercise: 8-22 mins $Therapeutic Activity: 8-22 mins                    G Codes:      Adabella Stanis 10/07/2015, 11:49 AM

## 2015-09-10 DIAGNOSIS — I10 Essential (primary) hypertension: Secondary | ICD-10-CM | POA: Insufficient documentation

## 2015-09-22 ENCOUNTER — Ambulatory Visit (INDEPENDENT_AMBULATORY_CARE_PROVIDER_SITE_OTHER): Payer: 59 | Admitting: Family Medicine

## 2015-09-22 ENCOUNTER — Ambulatory Visit
Admission: RE | Admit: 2015-09-22 | Discharge: 2015-09-22 | Disposition: A | Discharge status: Home / Self Care | Payer: 59 | Source: Ambulatory Visit | Attending: Family Medicine | Admitting: Family Medicine

## 2015-09-22 ENCOUNTER — Encounter: Payer: Self-pay | Admitting: Family Medicine

## 2015-09-22 VITALS — BP 102/60 | HR 68 | Ht 62.0 in | Wt 225.4 lb

## 2015-09-22 DIAGNOSIS — R062 Wheezing: Secondary | ICD-10-CM

## 2015-09-22 MED ORDER — PREDNISONE 20 MG PO TABS
20.0000 mg | ORAL_TABLET | Freq: Two times a day (BID) | ORAL | Status: DC
Start: 2015-09-22 — End: 2015-10-13

## 2015-09-22 NOTE — Patient Instructions (Signed)
  Wheezing-- Go to [facility] for chest x-ray ([address] [place]). Continue to use the albuterol inhaler as needed. I suspect you have some allergies starting--use nasal saline spray frequently, and continue the Flonase and Zyrtec. If your x-ray is normal, and your wheezing persists (requiring ongoing use of inhaler) then you need to fill the steroid course (short course of prednisone--twice daily for 5 days) to help improve/resolve your symptoms. Continue the Protonix twice daily--check your pill boxes to make sure you are taking it (as reflux often causes wheezing).

## 2015-09-22 NOTE — Progress Notes (Signed)
Chief Complaint  Patient presents with  . Wheezing    has been wheezing x 2 weeks. Had knee surgery 3 days prior and did call here and speak to Tok. He told her to go to UC to make sure didn't have blood clot in lung.    3 days post-op from knee surgery (R TKA) she started wheezing.  She spoke with Audelia Acton after hours, who recommended she be seen in Urgent Care (concern for blood clot post-op).  She was seen at [facility] on 10/15 and was diagnosed with bronchitis, post-op, treated with promethazine with DM syrup and albuterol inhaler.  She was told to only use these medications for 7 days, which helped while she was using them.  She stopped the meds 4 days ago, and has had ongoing wheezing. She has just an occasional cough.  She hasn't been having any Fall allergy symptoms yet.  Denies any postnasal drainage, heartburn or reflux.  She has trouble breathing when she leans forwards to tie her shoes, as well as with her PT. She needed to restart the inhaler, using it last night and this morning.  She feels better today than yesterday, but doesn't notice a quick improvement after using the inhaler.  Her last dose of Xarelto is tomorrow. Hasn't had any bleeding, bruising complications.  Denies any calf pain or swelling.  She completed home PT earlier this week, and starts next week with outpatient PT next week.  She reports being compliant with her medications, including her allergy meds and her protonix BID (she stopped it short-term as directed by doctors with surgery, but believes she has put them back in her pill box).  PMH, PSH, SH reviewed/updated  Outpatient Encounter Prescriptions as of 09/22/2015  Medication Sig Note  . acetaminophen (TYLENOL) 500 MG tablet Take 1,000 mg by mouth every 6 (six) hours as needed for mild pain.    Marland Kitchen ALPRAZolam (XANAX) 0.25 MG tablet Take 0.25 mg by mouth 3 (three) times daily as needed for anxiety. For anxiety   . ARIPiprazole (ABILIFY) 5 MG tablet Take 5 mg  by mouth every morning. Nausea & anxiety 08/23/2015: -  . Armodafinil (NUVIGIL) 250 MG tablet Take 250 mg by mouth every morning.    Marland Kitchen atorvastatin (LIPITOR) 40 MG tablet TAKE 1 TABLET (40 MG TOTAL) BY MOUTH DAILY.   . cetirizine (ZYRTEC) 10 MG tablet Take 10 mg by mouth at bedtime.   . clonazePAM (KLONOPIN) 0.5 MG tablet Take 0.5 mg by mouth at bedtime.    . DULoxetine (CYMBALTA) 60 MG capsule Take 60 mg by mouth 2 (two) times daily.   . fluticasone (FLONASE) 50 MCG/ACT nasal spray Place 2 sprays into both nostrils daily.   Marland Kitchen LYRICA 75 MG capsule Take 75 mg by mouth 2 (two) times daily. 08/24/2015: -  . methocarbamol (ROBAXIN) 500 MG tablet Take 1 tablet (500 mg total) by mouth every 6 (six) hours as needed for muscle spasms.   . metoprolol (LOPRESSOR) 50 MG tablet Take 1 tablet (50 mg total) by mouth 2 (two) times daily.   . nortriptyline (PAMELOR) 25 MG capsule Take 100 mg by mouth at bedtime.   . ondansetron (ZOFRAN) 8 MG tablet Take by mouth every 8 (eight) hours as needed for nausea or vomiting.   Marland Kitchen oxyCODONE (OXY IR/ROXICODONE) 5 MG immediate release tablet Take 1-2 tablets (5-10 mg total) by mouth every 3 (three) hours as needed for moderate pain, severe pain or breakthrough pain.   . pantoprazole (  PROTONIX) 40 MG tablet Take 40 mg by mouth 2 (two) times daily.   Marland Kitchen PROAIR RESPICLICK 166 (90 BASE) MCG/ACT AEPB INHALE 2 PUFFS 3 TIMES A DAY FOR 7 DAYS 09/22/2015: Received from: External Pharmacy  . Probiotic Product ([place]) CAPS Take 1 capsule by mouth daily.   . promethazine (PHENERGAN) 25 MG suppository Place 25 mg rectally every 6 (six) hours as needed for nausea or vomiting.   . promethazine-dextromethorphan (PROMETHAZINE-DM) 6.25-15 MG/5ML syrup TAKE 1 TEASPOONFUL (5 ML) EVERY 6 HOURS BY ORAL ROUTE FOR 7 DAYS. 09/22/2015: Received from: External Pharmacy  . rivaroxaban (XARELTO) 10 MG TABS tablet Take 1 tablet (10 mg total) by mouth daily with breakfast. Take Xarelto for two  and a half more weeks, then discontinue Xarelto. Once the patient has completed the blood thinner regimen, then take a Baby 81 mg Aspirin daily for three more weeks.   . topiramate (TOPAMAX) 25 MG tablet Take 50 mg by mouth at bedtime.   . triamterene-hydrochlorothiazide (DYAZIDE) 37.5-25 MG per capsule TAKE ONE CAPSULE BY MOUTH EVERY DAY   . SUMAtriptan (IMITREX) 50 MG tablet Take 50 mg by mouth every 2 (two) hours as needed for migraine. May repeat in 2 hours if headache persists or recurs.    No facility-administered encounter medications on file as of 09/22/2015.   Allergies  Allergen Reactions  . Erythromycin Nausea Only  . Tramadol Itching  . Dilaudid [Hydromorphone Hcl] Itching    ROS:  Denies fevers, chills, allergy symptoms, chest pain. Nausea is at her baseline (some yesterday).  Nausea gets worse from her pain meds. Some constipation, but controlled with Miralax and stool softener.  Denies bleeding, bruising, rash. Knee pain is improving.  PHYSICAL EXAM:  BP 102/60 mmHg  Pulse 68  Ht 5\' 2"  (1.575 m)  Wt 225 lb 6.4 oz (102.241 kg)  BMI 41.22 kg/m2  SpO2 98%  LMP 07/27/2006  Well developed, pleasant female, speaking easily, in no distress HEENT: PERRL, EOMI, conjunctiva clear. TM's and EAc's normal. Nasal mucosa is mild-mod edematous with some white crusting bilaterally. OP is clear, slightly dry mucus membranes. No erythema Neck: no lymphadenopathy or mass Heart: regular rate and rhythm without murmur Lungs: good air movement throughout, trace end-expiratory wheezes noted anteriorly Abdomen: soft, nontender, no mass Extremities: no edema.  Healed incision right knee Psych: normal mood, affect, hygiene and grooming Neuro: alert and oriented.   ASSESSMENT/PLAN:  Wheezing - Plan: DG Chest 2 View, predniSONE (DELTASONE) 20 MG tablet  Wheezing-- Go to [facility] for chest x-ray ([address] or [facility]). Continue to use the albuterol inhaler as needed. I  suspect you have some allergies starting--use nasal saline spray frequently, and continue the Flonase and Zyrtec. If your x-ray is normal, and your wheezing persists (requiring ongoing use of inhaler) then you need to fill the steroid course (short course of prednisone--twice daily for 5 days) to help improve/resolve your symptoms. Continue the Protonix twice daily--check your pill boxes to make sure you are taking it (as reflux often causes wheezing).

## 2015-10-04 ENCOUNTER — Ambulatory Visit: Payer: 59 | Admitting: Cardiovascular Disease

## 2015-10-05 ENCOUNTER — Other Ambulatory Visit: Payer: Self-pay | Admitting: Family Medicine

## 2015-10-05 ENCOUNTER — Other Ambulatory Visit: Payer: Self-pay | Admitting: Medical

## 2015-10-05 ENCOUNTER — Telehealth: Payer: Self-pay | Admitting: Family Medicine

## 2015-10-05 NOTE — Telephone Encounter (Signed)
Called pharmacy and Dr.Penwalla rx'd in the past.

## 2015-10-05 NOTE — Telephone Encounter (Signed)
Rcvd refill request for Armodafinil 250mg 

## 2015-10-05 NOTE — Telephone Encounter (Signed)
We do not prescribe this medication for her.

## 2015-10-05 NOTE — Telephone Encounter (Signed)
Is this ok to refill?  

## 2015-10-12 NOTE — Progress Notes (Signed)
Patient ID: [name], female   DOB: 17-Mar-1956, 59 y.o.   MRN: [id number]     Cardiology Office Note   Date:  10/13/2015   ID:  [name], DOB 03/21/1956, MRN [id number]  PCP:  Vikki Ports, MD  Cardiologist:   Jenkins Rouge, MD   Chief Complaint  Patient presents with  . Hypertension    no refills      History of Present Illness: [name] is a 59 y.o. female initally seen for preop clearance October   Uneventful right TKR Dr Maureen Ralphs 09/05/15  Overweight, postmenapausal on HRT and glucose intolerant elevated lipids on statin .  OSA on CPAP since 2009 Had arthroscopic surgery on right Knee in 2009 with Dr Daine Gravel about her dad who is at [facility] with recent stroke/hospice.  She is sedentary.  Has always had a high HR and takes Inderal 40 mg at night.  No recent TSH/T4 She thinks she will put her knee surgery off until her dad passes.  Has chronic fatigue and fibromyalgia Rheum Deveschwar has had her on nuwvigil for years   07/29/15  Myovue normal no ischemia reviewed 08/23/15  Echo normal EF 60-65%  Grade one diastolic dysfunction   Past Medical History  Diagnosis Date  . Fibromyalgia   . GERD (gastroesophageal reflux disease)   . Obstructive sleep apnea     On Cpap 2009  . Anxiety   . Depression   . Restless leg syndrome   . Edema   . Migraine   . Hyperlipidemia   . Anemia     previously followed by Dr. Jamse Arn for anemia and elevated platelets  . Paresthesia     Dr. Everette Rank at [facility]  . Tremor     Dr. Everette Rank  . Gastroparesis     followed at [facility]  . Pneumonia     2012  . Plantar fasciitis 02/2011    R foot  . S/P endometrial ablation 08/09/2006    Novasure Ablation  . C. difficile colitis 10/01/12    treated by [facility]  . Dyssynergia     dyssynergenic defecation, contributing to fecal incontinence.  . Kidney stone   . Pelvic floor dysfunction     pelvic floor dyssynergy  . Neuropathy ([place])   . DDD (degenerative disc disease),  lumbar 08/19/14    and facet arthroplasty & left lumbar radiculopathy (Dr.Ramos)  . S/P epidural steroid injection 09/20/14    Dr.Ramos  . PONV (postoperative nausea and vomiting)     pt states has gastroparesis has difficulty taking antibiotics and narcotics has severe nausea and vomiting   . Shortness of breath dyspnea     bending over; exertion   . Urinary frequency   . Urinary incontinence   . Chronic fatigue syndrome   . Lumbar radiculopathy     Past Surgical History  Procedure Laterality Date  . Tonsillectomy  1968  . Knee surgery  1999    R knee, Dr. Eddie Dibbles, torn cartilage  . Cholecystectomy  9/05  . Endometrial ablation  08/09/2006    Dr. Valentina Shaggy Ablation  . Tonsillectomy    . Total knee arthroplasty Right 09/05/2015    Procedure: RIGHT TOTAL KNEE ARTHROPLASTY;  Surgeon: Gaynelle Arabian, MD;  Location: [facility];  Service: Orthopedics;  Laterality: Right;     Current Outpatient Prescriptions  Medication Sig Dispense Refill  . acetaminophen (TYLENOL) 500 MG tablet Take 1,000 mg by mouth every 6 (six) hours as needed for mild pain.     Marland Kitchen  ALPRAZolam (XANAX) 0.25 MG tablet Take 0.25 mg by mouth 3 (three) times daily as needed for anxiety.     . ARIPiprazole (ABILIFY) 5 MG tablet Take 5 mg by mouth every morning. Nausea & anxiety  3  . Armodafinil (NUVIGIL) 250 MG tablet Take 250 mg by mouth every morning.     Marland Kitchen atorvastatin (LIPITOR) 40 MG tablet TAKE 1 TABLET (40 MG TOTAL) BY MOUTH DAILY. 90 tablet 1  . cetirizine (ZYRTEC) 10 MG tablet Take 10 mg by mouth at bedtime.    . clonazePAM (KLONOPIN) 0.5 MG tablet Take 0.5 mg by mouth at bedtime.     . DULoxetine (CYMBALTA) 60 MG capsule Take 60 mg by mouth 2 (two) times daily.    . fluticasone (FLONASE) 50 MCG/ACT nasal spray Place 2 sprays into both nostrils daily. 16 g 11  . LYRICA 75 MG capsule Take 75 mg by mouth 2 (two) times daily.  0  . methocarbamol (ROBAXIN) 500 MG tablet Take 1 tablet (500 mg total) by mouth every  6 (six) hours as needed for muscle spasms. 80 tablet 0  . metoprolol (LOPRESSOR) 50 MG tablet Take 1 tablet (50 mg total) by mouth 2 (two) times daily. 60 tablet 11  . nortriptyline (PAMELOR) 25 MG capsule Take 100 mg by mouth at bedtime.    . ondansetron (ZOFRAN) 8 MG tablet Take by mouth every 8 (eight) hours as needed for nausea or vomiting.    . pantoprazole (PROTONIX) 40 MG tablet Take 40 mg by mouth 2 (two) times daily.    . Probiotic Product ([place]) CAPS Take 1 capsule by mouth daily.    . promethazine (PHENERGAN) 25 MG suppository Place 25 mg rectally every 6 (six) hours as needed for nausea or vomiting.    . SUMAtriptan (IMITREX) 50 MG tablet Take 50 mg by mouth every 2 (two) hours as needed for migraine. May repeat in 2 hours if headache persists or recurs.    . topiramate (TOPAMAX) 25 MG tablet Take 50 mg by mouth at bedtime.    . triamterene-hydrochlorothiazide (DYAZIDE) 37.5-25 MG capsule TAKE ONE CAPSULE BY MOUTH EVERY DAY 90 capsule 0  . [DISCONTINUED] carbamazepine (TEGRETOL XR) 100 MG 12 hr tablet Take 200 mg by mouth daily.      . [DISCONTINUED] Eszopiclone 3 MG TABS Take 3 mg by mouth at bedtime. Take immediately before bedtime     No current facility-administered medications for this visit.    Allergies:   Erythromycin; Tramadol; and Dilaudid    Social History:  The patient  reports that she has never smoked. She has never used smokeless tobacco. She reports that she does not drink alcohol or use illicit drugs.   Family History:  The patient's family history includes Allergies in her mother and sister; Asthma in her sister; Diabetes in her father; Heart disease in her father, mother, paternal grandfather, and paternal grandmother; Hypertension in her father and mother; Irritable bowel syndrome in her sister; Kidney disease in her father; Stroke in her father.    ROS:  Please see the history of present illness.   Otherwise, review of systems are positive for  none.   All other systems are reviewed and negative.    PHYSICAL EXAM: VS:  BP 140/82 mmHg  Pulse 98  Ht 5' 2.5" (1.588 m)  Wt 101.061 kg (222 lb 12.8 oz)  BMI 40.08 kg/m2  LMP 07/27/2006 , BMI Body mass index is 40.08 kg/(m^2). Affect appropriate Obese white female  HEENT: normal Neck supple with no adenopathy JVP normal no bruits no thyromegaly Lungs clear with no wheezing and good diaphragmatic motion Heart:  S1/S2 no murmur, no rub, gallop or click PMI normal Abdomen: benighn, BS positve, no tenderness, no AAA no bruit.  No HSM or HJR Distal pulses intact with no bruits No edema Neuro non-focal Skin warm and dry No muscular weakness    EKG:  02/19/13  ST rate 119 poor R wave progression  07/25/15  SR rate 102  LAFB  Possible anterior MI   Recent Labs: 07/25/2015: TSH 2.24 08/25/2015: ALT 24 09/07/2015: BUN 20; Creatinine, Ser 0.72; Hemoglobin 11.0*; Platelets 414*; Potassium 3.6; Sodium 139    Lipid Panel    Component Value Date/Time   CHOL 162 05/16/2015 0001   TRIG 142 05/16/2015 0001   HDL 57 05/16/2015 0001   CHOLHDL 2.8 05/16/2015 0001   VLDL 28 05/16/2015 0001   LDLCALC 77 05/16/2015 0001      Wt Readings from Last 3 Encounters:  10/13/15 101.061 kg (222 lb 12.8 oz)  09/22/15 102.241 kg (225 lb 6.4 oz)  09/05/15 100.925 kg (222 lb 8 oz)      Other studies Reviewed: Additional studies/ records that were reviewed today include: Epic notes Primary notes and old ECG;s .    ASSESSMENT AND PLAN:  1.  Ortho:  Post TKR improved continue OT/PT 2. Tachycardia  Improved with beta blocker seems physiologic given normal echo and myovue  3. OSA:  Discussed weight loss continue CPAP 4. ADD:  Rockie Neighbours may be driving her HR  May need to stop    Current medicines are reviewed at length with the patient today.  The patient does not have concerns regarding medicines.  The following changes have been made:  lopresser 50 bifd  Labs/ tests ordered today include:   No orders of the defined types were placed in this encounter.     Disposition:   FU with me PRN    Signed, Jenkins Rouge, MD  10/13/2015 2:50 PM    [facility] [place], [place], [place]  [address] Phone: [phone]; Fax: [phone]

## 2015-10-13 ENCOUNTER — Ambulatory Visit (INDEPENDENT_AMBULATORY_CARE_PROVIDER_SITE_OTHER): Payer: 59 | Admitting: Cardiovascular Disease

## 2015-10-13 ENCOUNTER — Encounter: Payer: Self-pay | Admitting: Cardiovascular Disease

## 2015-10-13 VITALS — BP 140/82 | HR 98 | Ht 62.5 in | Wt 222.8 lb

## 2015-10-13 DIAGNOSIS — R0600 Dyspnea, unspecified: Secondary | ICD-10-CM | POA: Diagnosis not present

## 2015-10-13 DIAGNOSIS — I10 Essential (primary) hypertension: Secondary | ICD-10-CM

## 2015-10-13 NOTE — Patient Instructions (Signed)
Medication Instructions:  Your physician recommends that you continue on your current medications as directed. Please refer to the Current Medication list given to you today.   Labwork: NONE  Testing/Procedures: NONE  Follow-Up: AS NEEDED   Any Other Special Instructions Will Be Listed Below (If Applicable).     If you need a refill on your cardiac medications before your next appointment, please call your pharmacy. 

## 2015-10-24 ENCOUNTER — Ambulatory Visit
Admission: RE | Admit: 2015-10-24 | Discharge: 2015-10-24 | Disposition: A | Discharge status: Home / Self Care | Payer: 59 | Source: Ambulatory Visit | Attending: Family Medicine | Admitting: Family Medicine

## 2015-10-24 ENCOUNTER — Encounter: Payer: Self-pay | Admitting: Family Medicine

## 2015-10-24 ENCOUNTER — Ambulatory Visit (INDEPENDENT_AMBULATORY_CARE_PROVIDER_SITE_OTHER): Payer: 59 | Admitting: Family Medicine

## 2015-10-24 DIAGNOSIS — R0789 Other chest pain: Secondary | ICD-10-CM

## 2015-10-24 DIAGNOSIS — M546 Pain in thoracic spine: Secondary | ICD-10-CM
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
## 2015-10-24 NOTE — Patient Instructions (Signed)
Go to [place] for chest x-ray. Try using some heat to the chest and back (usually ice is recommended, but it is >48 hours, so try heat--you can try alternating to see if that feels better). The bruising should gradually fade to yellow and then resolve.  You have a rash under the right breast related to moisture--be sure to keep it dry, and consider using a medicated powder or cornstarch powder.

## 2015-11-27 ENCOUNTER — Encounter (HOSPITAL_COMMUNITY): Payer: Self-pay | Admitting: *Deleted

## 2015-11-27 ENCOUNTER — Emergency Department (INDEPENDENT_AMBULATORY_CARE_PROVIDER_SITE_OTHER): Payer: 59

## 2015-11-27 ENCOUNTER — Emergency Department (HOSPITAL_COMMUNITY): Payer: 59

## 2015-11-27 ENCOUNTER — Emergency Department (HOSPITAL_COMMUNITY)
Admission: EM | Admit: 2015-11-27 | Discharge: 2015-11-28 | Disposition: A | Discharge status: Home / Self Care | Payer: 59 | Attending: Emergency Medicine | Admitting: Emergency Medicine

## 2015-11-27 ENCOUNTER — Emergency Department (HOSPITAL_COMMUNITY)
Admission: EM | Admit: 2015-11-27 | Discharge: 2015-11-27 | Disposition: A | Discharge status: Nursing Home (Includes ICF) | Payer: 59 | Source: Home / Self Care

## 2015-11-27 DIAGNOSIS — Z9889 Other specified postprocedural states: Secondary | ICD-10-CM | POA: Insufficient documentation

## 2015-11-27 DIAGNOSIS — G43909 Migraine, unspecified, not intractable, without status migrainosus: Secondary | ICD-10-CM | POA: Insufficient documentation

## 2015-11-27 DIAGNOSIS — R0600 Dyspnea, unspecified: Secondary | ICD-10-CM

## 2015-11-27 DIAGNOSIS — R61 Generalized hyperhidrosis: Secondary | ICD-10-CM | POA: Diagnosis not present

## 2015-11-27 DIAGNOSIS — R0602 Shortness of breath: Secondary | ICD-10-CM | POA: Diagnosis not present

## 2015-11-27 DIAGNOSIS — Z87442 Personal history of urinary calculi: Secondary | ICD-10-CM | POA: Diagnosis not present

## 2015-11-27 DIAGNOSIS — G4733 Obstructive sleep apnea (adult) (pediatric): Secondary | ICD-10-CM | POA: Diagnosis not present

## 2015-11-27 DIAGNOSIS — K219 Gastro-esophageal reflux disease without esophagitis: Secondary | ICD-10-CM | POA: Diagnosis not present

## 2015-11-27 DIAGNOSIS — Z7951 Long term (current) use of inhaled steroids: Secondary | ICD-10-CM | POA: Diagnosis not present

## 2015-11-27 DIAGNOSIS — Z8742 Personal history of other diseases of the female genital tract: Secondary | ICD-10-CM | POA: Insufficient documentation

## 2015-11-27 DIAGNOSIS — R42 Dizziness and giddiness: Secondary | ICD-10-CM | POA: Insufficient documentation

## 2015-11-27 DIAGNOSIS — Z9981 Dependence on supplemental oxygen: Secondary | ICD-10-CM | POA: Insufficient documentation

## 2015-11-27 DIAGNOSIS — M797 Fibromyalgia: Secondary | ICD-10-CM | POA: Diagnosis not present

## 2015-11-27 DIAGNOSIS — E785 Hyperlipidemia, unspecified: Secondary | ICD-10-CM | POA: Insufficient documentation

## 2015-11-27 DIAGNOSIS — R079 Chest pain, unspecified: Secondary | ICD-10-CM | POA: Diagnosis present

## 2015-11-27 DIAGNOSIS — Z862 Personal history of diseases of the blood and blood-forming organs and certain disorders involving the immune mechanism: Secondary | ICD-10-CM | POA: Insufficient documentation

## 2015-11-27 DIAGNOSIS — Z8619 Personal history of other infectious and parasitic diseases: Secondary | ICD-10-CM | POA: Diagnosis not present

## 2015-11-27 DIAGNOSIS — F329 Major depressive disorder, single episode, unspecified: Secondary | ICD-10-CM | POA: Diagnosis not present

## 2015-11-27 DIAGNOSIS — Z79899 Other long term (current) drug therapy: Secondary | ICD-10-CM | POA: Insufficient documentation

## 2015-11-27 DIAGNOSIS — R0609 Other forms of dyspnea: Secondary | ICD-10-CM | POA: Diagnosis not present

## 2015-11-27 DIAGNOSIS — Z8701 Personal history of pneumonia (recurrent): Secondary | ICD-10-CM | POA: Diagnosis not present

## 2015-11-27 DIAGNOSIS — F419 Anxiety disorder, unspecified: Secondary | ICD-10-CM | POA: Diagnosis not present

## 2015-11-27 LAB — URINALYSIS, ROUTINE W REFLEX MICROSCOPIC
Bilirubin Urine: NEGATIVE
Glucose, UA: NEGATIVE mg/dL
Ketones, ur: NEGATIVE mg/dL
Nitrite: NEGATIVE
Protein, ur: NEGATIVE mg/dL
Specific Gravity, Urine: 1.02 (ref 1.005–1.030)
pH: 6 (ref 5.0–8.0)

## 2015-11-27 LAB — BASIC METABOLIC PANEL
Anion gap: 10 (ref 5–15)
BUN: 16 mg/dL (ref 6–20)
CO2: 28 mmol/L (ref 22–32)
Calcium: 9.7 mg/dL (ref 8.9–10.3)
Chloride: 107 mmol/L (ref 101–111)
Creatinine, Ser: 0.91 mg/dL (ref 0.44–1.00)
GFR calc Af Amer: >60 mL/min (ref >60)
GFR calc non Af Amer: >60 mL/min (ref >60)
Glucose, Bld: 99 mg/dL (ref 65–99)
Potassium: 4.7 mmol/L (ref 3.5–5.1)
Sodium: 145 mmol/L (ref 135–145)

## 2015-11-27 LAB — DIFFERENTIAL
Basophils Absolute: 0.1 10*3/uL (ref 0.0–0.1)
Basophils Relative: 1 %
Eosinophils Absolute: 0.4 10*3/uL (ref 0.0–0.7)
Eosinophils Relative: 3 %
Lymphocytes Relative: 23 %
Lymphs Abs: 3.2 10*3/uL (ref 0.7–4.0)
Monocytes Absolute: 1.1 10*3/uL — ABNORMAL HIGH (ref 0.1–1.0)
Monocytes Relative: 8 %
Neutro Abs: 9.4 10*3/uL — ABNORMAL HIGH (ref 1.7–7.7)
Neutrophils Relative %: 65 %

## 2015-11-27 LAB — URINE MICROSCOPIC-ADD ON

## 2015-11-27 LAB — I-STAT TROPONIN, ED
Troponin i, poc: 0 ng/mL (ref 0.00–0.08)
Troponin i, poc: 0 ng/mL (ref 0.00–0.08)

## 2015-11-27 LAB — CBC
HCT: 40.7 % (ref 36.0–46.0)
Hemoglobin: 12.4 g/dL (ref 12.0–15.0)
MCH: 26.7 pg (ref 26.0–34.0)
MCHC: 30.5 g/dL (ref 30.0–36.0)
MCV: 87.5 fL (ref 78.0–100.0)
Platelets: 432 10*3/uL — ABNORMAL HIGH (ref 150–400)
RBC: 4.65 MIL/uL (ref 3.87–5.11)
RDW: 16.8 % — ABNORMAL HIGH (ref 11.5–15.5)
WBC: 13 10*3/uL — ABNORMAL HIGH (ref 4.0–10.5)

## 2015-11-27 LAB — D-DIMER, QUANTITATIVE: D-Dimer, Quant: 1.18 ug/mL-FEU — ABNORMAL HIGH (ref 0.00–0.50)

## 2015-11-27 MED ORDER — IOHEXOL 350 MG/ML SOLN
80.0000 mL | Freq: Once | INTRAVENOUS | Status: AC | PRN
  Administered 2015-11-27: 80 mL via INTRAVENOUS

## 2015-11-27 NOTE — ED Provider Notes (Signed)
CSN: [id number]     Arrival date & time 11/27/15  1536 History   First MD Initiated Contact with Patient 11/27/15 1925     Chief Complaint  Patient presents with  . Chest Pain  . Dizziness  . Shortness of Breath   60 year old Caucasian female with PMH of HTN, HLD, and gastroparesis who presents today from urgent care for chest pain. Patient says it been going on for a couple of days but worse today. Accompanied by shortness of breath on exertion. Chest pain as a pressure sensation in the substernal area and a little bit in the left of the chest, does not radiate to the back arm or face. He was diaphoretic with the shortness of breath yesterday. She denies any fevers, chills, vomiting, abdominal pain, cough, sick contacts, hemoptysis, recent travel, leg swelling.  (Consider location/radiation/quality/duration/timing/severity/associated sxs/prior Treatment) Patient is a 60 y.o. female presenting with shortness of breath.  Shortness of Breath Severity:  Moderate Onset quality:  Sudden Timing:  Constant Chronicity:  New Context: activity   Relieved by:  Rest Worsened by:  Activity Associated symptoms: chest pain (left and middle)   Associated symptoms: no abdominal pain, no cough, no fever, no headaches, no vomiting and no wheezing     Past Medical History  Diagnosis Date  . Fibromyalgia   . GERD (gastroesophageal reflux disease)   . Obstructive sleep apnea     On Cpap 2009  . Anxiety   . Depression   . Restless leg syndrome   . Edema   . Migraine   . Hyperlipidemia   . Anemia     previously followed by Dr. Jamse Arn for anemia and elevated platelets  . Paresthesia     Dr. [facility]rette [facility]  . Tremor     Dr. Everette Rank  . Gastroparesis     followed at [facility]  . Pneumonia     2012  . Plantar fasciitis 02/2011    R foot  . S/P endometrial ablation 08/09/2006    Novasure Ablation  . C. difficile colitis 10/01/12    treated by [facility]  . Dyssynergia     dyssynergenic  defecation, contributing to fecal incontinence.  . Kidney stone   . Pelvic floor dysfunction     pelvic floor dyssynergy  . Neuropathy ([place])   . DDD (degenerative disc disease), lumbar 08/19/14    and facet arthroplasty & left lumbar radiculopathy (Dr.Ramos)  . S/P epidural steroid injection 09/20/14    Dr.Ramos  . PONV (postoperative nausea and vomiting)     pt states has gastroparesis has difficulty taking antibiotics and narcotics has severe nausea and vomiting   . Shortness of breath dyspnea     bending over; exertion   . Urinary frequency   . Urinary incontinence   . Chronic fatigue syndrome   . Lumbar radiculopathy    Past Surgical History  Procedure Laterality Date  . Tonsillectomy  1968  . Knee surgery  1999    R knee, Dr. Eddie Dibbles, torn cartilage  . Cholecystectomy  9/05  . Endometrial ablation  08/09/2006    Dr. Valentina Shaggy Ablation  . Tonsillectomy    . Total knee arthroplasty Right 09/05/2015    Procedure: RIGHT TOTAL KNEE ARTHROPLASTY;  Surgeon: Gaynelle Arabian, MD;  Location: [facility];  Service: Orthopedics;  Laterality: Right;   Family History  Problem Relation Age of Onset  . Allergies Mother   . Hypertension Mother   . Heart disease Mother     possible  valve problem  . Heart disease Father     pacemaker, CHF  . Hypertension Father   . Diabetes Father     borderline  . Stroke Father   . Kidney disease Father   . Heart disease Paternal Grandmother   . Heart disease Paternal Grandfather   . Asthma Sister   . Irritable bowel syndrome Sister   . Allergies Sister    Social History  Substance Use Topics  . Smoking status: Never Smoker   . Smokeless tobacco: Never Used  . Alcohol Use: No   OB History    Gravida Para Term Preterm AB TAB SAB Ectopic Multiple Living   1 1 1       1      Review of Systems  Constitutional: Negative for fever and chills.  Respiratory: Positive for shortness of breath. Negative for cough, choking, chest tightness, wheezing  and stridor.   Cardiovascular: Positive for chest pain (left and middle). Negative for palpitations and leg swelling.  Gastrointestinal: Negative for nausea, vomiting, abdominal pain, diarrhea, constipation and abdominal distention.  Genitourinary: Negative for dysuria, frequency, flank pain and decreased urine volume.  Neurological: Negative for dizziness, speech difficulty, light-headedness and headaches.  All other systems reviewed and are negative.     Allergies  Erythromycin; Tramadol; and Dilaudid  Home Medications   Prior to Admission medications   Medication Sig Start Date End Date Taking? Authorizing Provider  acetaminophen (TYLENOL) 500 MG tablet Take 1,000 mg by mouth every 6 (six) hours as needed for mild pain.    Yes Historical Provider, MD  albuterol (PROVENTIL HFA;VENTOLIN HFA) 108 (90 Base) MCG/ACT inhaler Inhale into the lungs every 6 (six) hours as needed for wheezing or shortness of breath.   Yes Historical Provider, MD  ALPRAZolam (XANAX) 0.25 MG tablet Take 0.25 mg by mouth 3 (three) times daily as needed for anxiety.    Yes Historical Provider, MD  ARIPiprazole (ABILIFY) 5 MG tablet Take 5 mg by mouth every morning. Nausea & anxiety 06/10/15  Yes Historical Provider, MD  Armodafinil (NUVIGIL) 250 MG tablet Take 250 mg by mouth every morning.    Yes Historical Provider, MD  atorvastatin (LIPITOR) 40 MG tablet TAKE 1 TABLET (40 MG TOTAL) BY MOUTH DAILY. 12/06/14  Yes Rita Ohara, MD  cetirizine (ZYRTEC) 10 MG tablet Take 10 mg by mouth at bedtime.   Yes Historical Provider, MD  DULoxetine (CYMBALTA) 60 MG capsule Take 60 mg by mouth 2 (two) times daily.   Yes Historical Provider, MD  estradiol (VIVELLE-DOT) 0.05 MG/24HR patch PLACE 1 PATCH (0.05 MG TOTAL) ONTO THE SKIN 2 (TWO) TIMES A WEEK. 10/13/15  Yes Historical Provider, MD  fluticasone (FLONASE) 50 MCG/ACT nasal spray Place 2 sprays into both nostrils daily. 05/16/15  Yes Rita Ohara, MD  LYRICA 75 MG capsule Take 75 mg  by mouth 2 (two) times daily. 06/29/15  Yes Historical Provider, MD  methocarbamol (ROBAXIN) 500 MG tablet Take 1 tablet (500 mg total) by mouth every 6 (six) hours as needed for muscle spasms. 09/06/15  Yes Arlee Muslim, PA-C  metoprolol (LOPRESSOR) 50 MG tablet Take 1 tablet (50 mg total) by mouth 2 (two) times daily. 07/25/15  Yes Josue Hector, MD  Multiple Vitamins-Minerals (ICAPS PO) Take 1 capsule by mouth daily.    Yes Historical Provider, MD  nortriptyline (PAMELOR) 50 MG capsule Take 100 mg by mouth at bedtime.  10/18/15  Yes Historical Provider, MD  ondansetron (ZOFRAN) 8 MG tablet Take 8 mg  by mouth every 8 (eight) hours as needed for nausea or vomiting.    Yes Historical Provider, MD  pantoprazole (PROTONIX) 40 MG tablet Take 40 mg by mouth 2 (two) times daily.   Yes Historical Provider, MD  Probiotic Product ([place]) CAPS Take 1 capsule by mouth daily.   Yes Historical Provider, MD  progesterone (PROMETRIUM) 100 MG capsule Take 100 mg by mouth daily.   Yes Historical Provider, MD  promethazine (PHENERGAN) 25 MG suppository Place 25 mg rectally every 6 (six) hours as needed for nausea or vomiting.   Yes Historical Provider, MD  SUMAtriptan (IMITREX) 50 MG tablet Take 50 mg by mouth every 2 (two) hours as needed for migraine. May repeat in 2 hours if headache persists or recurs.   Yes Historical Provider, MD  topiramate (TOPAMAX) 25 MG tablet Take 50 mg by mouth at bedtime.   Yes Historical Provider, MD  triamterene-hydrochlorothiazide (DYAZIDE) 37.5-25 MG capsule TAKE ONE CAPSULE BY MOUTH EVERY DAY 10/05/15  Yes Rita Ohara, MD  HYDROcodone-acetaminophen (NORCO/VICODIN) 5-325 MG tablet Take 1 tablet by mouth every 6 (six) hours as needed for severe pain. 11/28/15   Sherian Maroon, MD   BP 130/73 mmHg  Pulse 93  Temp(Src) 98.1 F (36.7 C) (Oral)  Resp 16  SpO2 100%  LMP 07/27/2006 Physical Exam  Constitutional: She appears well-developed and well-nourished. No distress.  HENT:   Head: Normocephalic and atraumatic.  Cardiovascular: Normal rate, regular rhythm, normal heart sounds and intact distal pulses.  Exam reveals no gallop and no friction rub.   No murmur heard. Pulmonary/Chest: Effort normal and breath sounds normal. No respiratory distress. She has no wheezes. She has no rales. She exhibits no tenderness.  Abdominal: Soft. Bowel sounds are normal. She exhibits no distension and no mass. There is no tenderness. There is no rebound and no guarding.  Musculoskeletal: Normal range of motion. She exhibits no edema or tenderness.  Lymphadenopathy:    She has no cervical adenopathy.  Neurological: She is alert.  Skin: Skin is warm and dry. She is not diaphoretic.  Nursing note and vitals reviewed.   ED Course  Procedures (including critical care time) Labs Review Labs Reviewed  CBC - Abnormal; Notable for the following:    WBC 13.0 (*)    RDW 16.8 (*)    Platelets 432 (*)    All other components within normal limits  URINALYSIS, ROUTINE W REFLEX MICROSCOPIC (NOT AT [facility]) - Abnormal; Notable for the following:    APPearance CLOUDY (*)    Hgb urine dipstick SMALL (*)    Leukocytes, UA LARGE (*)    All other components within normal limits  URINE MICROSCOPIC-ADD ON - Abnormal; Notable for the following:    Squamous Epithelial / LPF 0-5 (*)    Bacteria, UA MANY (*)    Crystals CA OXALATE CRYSTALS (*)    All other components within normal limits  DIFFERENTIAL - Abnormal; Notable for the following:    Neutro Abs 9.4 (*)    Monocytes Absolute 1.1 (*)    All other components within normal limits  D-DIMER, QUANTITATIVE (NOT AT [facility]) - Abnormal; Notable for the following:    D-Dimer, Quant 1.18 (*)    All other components within normal limits  BASIC METABOLIC PANEL  BRAIN NATRIURETIC PEPTIDE  I-STAT TROPOININ, ED  Randolm Idol, ED    Imaging Review Dg Chest 2 View  11/27/2015  CLINICAL DATA:  Sick for 2 days with cough. EXAM: CHEST  2  VIEW  COMPARISON:  09/2027. FINDINGS: Cardiomegaly. Thoracic atherosclerosis with calcification and tortuosity. No active infiltrates or failure. No effusion or pneumothorax. Cholecystectomy clips. No acute osseous findings. IMPRESSION: No active cardiopulmonary disease. Electronically Signed   By: Staci Righter M.D.   On: 11/27/2015 15:06   Ct Angio Chest Pe W/cm &/or Wo Cm  11/27/2015  CLINICAL DATA:  Acute onset of mid chest pain, shortness of breath and dizziness. Chest tightness. Dyspnea on exertion. Initial encounter. EXAM: CT ANGIOGRAPHY CHEST WITH CONTRAST TECHNIQUE: Multidetector CT imaging of the chest was performed using the standard protocol during bolus administration of intravenous contrast. Multiplanar CT image reconstructions and MIPs were obtained to evaluate the vascular anatomy. CONTRAST:  33mL OMNIPAQUE IOHEXOL 350 MG/ML SOLN COMPARISON:  Chest radiograph performed earlier today at 2:51 p.m., and CTA of the chest performed 04/17/2011 FINDINGS: There is no evidence of pulmonary embolus. Mild bilateral atelectasis is noted. The lungs are otherwise grossly clear. There is no evidence of significant focal consolidation, pleural effusion or pneumothorax. No masses are identified; no abnormal focal contrast enhancement is seen. Mediastinum is unremarkable in appearance. No mediastinal lymphadenopathy is seen. No pericardial effusion is identified. The great vessels are grossly unremarkable in appearance. No axillary lymphadenopathy is seen. The visualized portions of the thyroid gland are unremarkable in appearance. The visualized portions of the liver and spleen are unremarkable. The patient is status post cholecystectomy, with clips noted at the gallbladder fossa. The visualized portions of the pancreas and adrenal glands are grossly unremarkable. There is a fracture between the manubrium and sternum, which appeared fused in 2012, though it is somewhat subacute or chronic in appearance, given mild  surrounding healing response. Mild surrounding soft tissue hemorrhage is noted. Review of the MIP images confirms the above findings. IMPRESSION: 1. No evidence of pulmonary embolus. 2. Mild bilateral atelectasis noted.  Lungs otherwise clear. 3. Fracture between the manubrium and sternum, which appeared fused in 2012, though it is somewhat subacute or chronic in appearance, given mild surrounding healing response. Mild associated soft tissue hemorrhage noted. These results were called by telephone at the time of interpretation on 11/27/2015 at 11:36 pm to Dr. Sherian Maroon, who verbally acknowledged these results. Electronically Signed   By: Garald Balding M.D.   On: 11/27/2015 23:36   I have personally reviewed and evaluated these images and lab results as part of my medical decision-making.   EKG Interpretation None      MDM   Final diagnoses:  Chest pain at rest  Dyspnea on exertion   60 year old Caucasian female here with chest pain shortness of breath. See history of present illness for details. On exam patient in ED AFB SS aside from tachycardia. Chest pain not reproducible. Differential includes ACS versus PE. Consider but not consistent with dissection, pneumothorax, pneumonia, pyelonephritis.  D-dimer elevated, proceed with CT PE. No PE, does show old fracture between manubrium and sternum, not likely the cause of pain today. No recent falls or trauma to suspect this.   BnP wnl.   Overall, pt well appearing. No clear cause of her DOE and cp, has had no hypoxia, so stable for DC home w/FU to PCP. Strict return if worsening sx.    Pt was seen under the supervision of Dr. Kathrynn Humble.     Sherian Maroon, MD 11/28/15 0010  Varney Biles, MD 11/28/15 2258

## 2015-11-27 NOTE — ED Notes (Signed)
Report called to Cecille Rubin, ED First Nurse.

## 2015-11-27 NOTE — Discharge Instructions (Signed)
YOu should go to the Er for further evaluation

## 2015-11-27 NOTE — ED Provider Notes (Signed)
CSN: [id number]     Arrival date & time 11/27/15  1418 History   None    Chief Complaint  Patient presents with  . Chest Pain  . Shortness of Breath   (Consider location/radiation/quality/duration/timing/severity/associated sxs/prior Treatment) Patient is a 60 y.o. female presenting with chest pain and shortness of breath. The history is provided by the patient. No language interpreter was used.  Chest Pain Pain location:  Unable to specify Pain quality: aching and tightness   Pain radiates to:  Mid back Pain radiates to the back: yes   Pain severity:  Mild Onset quality:  Unable to specify Timing:  Intermittent Progression:  Waxing and waning Chronicity:  New Context: breathing and at rest   Relieved by:  Nothing Worsened by:  Exertion Associated symptoms: shortness of breath   Shortness of Breath Associated symptoms: chest pain     Past Medical History  Diagnosis Date  . Fibromyalgia   . GERD (gastroesophageal reflux disease)   . Obstructive sleep apnea     On Cpap 2009  . Anxiety   . Depression   . Restless leg syndrome   . Edema   . Migraine   . Hyperlipidemia   . Anemia     previously followed by Dr. Jamse Arn for anemia and elevated platelets  . Paresthesia     Dr. Everette Rank at [facility]  . Tremor     Dr. Everette Rank  . Gastroparesis     followed at [facility]  . Pneumonia     2012  . Plantar fasciitis 02/2011    R foot  . S/P endometrial ablation 08/09/2006    Novasure Ablation  . C. difficile colitis 10/01/12    treated by [facility]  . Dyssynergia     dyssynergenic defecation, contributing to fecal incontinence.  . Kidney stone   . Pelvic floor dysfunction     pelvic floor dyssynergy  . Neuropathy ([place])   . DDD (degenerative disc disease), lumbar 08/19/14    and facet arthroplasty & left lumbar radiculopathy (Dr.Ramos)  . S/P epidural steroid injection 09/20/14    Dr.Ramos  . PONV (postoperative nausea and vomiting)     pt states has gastroparesis has  difficulty taking antibiotics and narcotics has severe nausea and vomiting   . Shortness of breath dyspnea     bending over; exertion   . Urinary frequency   . Urinary incontinence   . Chronic fatigue syndrome   . Lumbar radiculopathy    Past Surgical History  Procedure Laterality Date  . Tonsillectomy  1968  . Knee surgery  1999    R knee, Dr. Eddie Dibbles, torn cartilage  . Cholecystectomy  9/05  . Endometrial ablation  08/09/2006    Dr. Valentina Shaggy Ablation  . Tonsillectomy    . Total knee arthroplasty Right 09/05/2015    Procedure: RIGHT TOTAL KNEE ARTHROPLASTY;  Surgeon: Gaynelle Arabian, MD;  Location: [facility];  Service: Orthopedics;  Laterality: Right;   Family History  Problem Relation Age of Onset  . Allergies Mother   . Hypertension Mother   . Heart disease Mother     possible valve problem  . Heart disease Father     pacemaker, CHF  . Hypertension Father   . Diabetes Father     borderline  . Stroke Father   . Kidney disease Father   . Heart disease Paternal Grandmother   . Heart disease Paternal Grandfather   . Asthma Sister   . Irritable bowel syndrome Sister   .  Allergies Sister    Social History  Substance Use Topics  . Smoking status: Never Smoker   . Smokeless tobacco: Never Used  . Alcohol Use: No   OB History    Gravida Para Term Preterm AB TAB SAB Ectopic Multiple Living   1 1 1       1      Review of Systems  Constitutional: Positive for activity change.  HENT: Negative.   Eyes: Negative.   Respiratory: Positive for shortness of breath.   Cardiovascular: Positive for chest pain.  Genitourinary: Negative.     Allergies  Erythromycin; Tramadol; and Dilaudid  Home Medications   Prior to Admission medications   Medication Sig Start Date End Date Taking? Authorizing Provider  albuterol (PROVENTIL HFA;VENTOLIN HFA) 108 (90 Base) MCG/ACT inhaler Inhale into the lungs every 6 (six) hours as needed for wheezing or shortness of breath.   Yes  Historical Provider, MD  ALPRAZolam (XANAX) 0.25 MG tablet Take 0.25 mg by mouth 3 (three) times daily as needed for anxiety.    Yes Historical Provider, MD  ARIPiprazole (ABILIFY) 5 MG tablet Take 5 mg by mouth every morning. Nausea & anxiety 06/10/15  Yes Historical Provider, MD  Armodafinil (NUVIGIL) 250 MG tablet Take 250 mg by mouth every morning.    Yes Historical Provider, MD  atorvastatin (LIPITOR) 40 MG tablet TAKE 1 TABLET (40 MG TOTAL) BY MOUTH DAILY. 12/06/14  Yes Rita Ohara, MD  DULoxetine (CYMBALTA) 60 MG capsule Take 60 mg by mouth 2 (two) times daily.   Yes Historical Provider, MD  estradiol (VIVELLE-DOT) 0.05 MG/24HR patch PLACE 1 PATCH (0.05 MG TOTAL) ONTO THE SKIN 2 (TWO) TIMES A WEEK. 10/13/15  Yes Historical Provider, MD  LYRICA 75 MG capsule Take 75 mg by mouth 2 (two) times daily. 06/29/15  Yes Historical Provider, MD  methocarbamol (ROBAXIN) 500 MG tablet Take 1 tablet (500 mg total) by mouth every 6 (six) hours as needed for muscle spasms. 09/06/15  Yes Arlee Muslim, PA-C  Multiple Vitamins-Minerals (ICAPS PO) Take by mouth.   Yes Historical Provider, MD  nortriptyline (PAMELOR) 50 MG capsule Take 100 mg by mouth at bedtime.  10/18/15  Yes Historical Provider, MD  ondansetron (ZOFRAN) 8 MG tablet Take by mouth every 8 (eight) hours as needed for nausea or vomiting.   Yes Historical Provider, MD  pantoprazole (PROTONIX) 40 MG tablet Take 40 mg by mouth 2 (two) times daily.   Yes Historical Provider, MD  prochlorperazine (COMPAZINE) 10 MG tablet Take 10 mg by mouth every 6 (six) hours as needed for nausea or vomiting.   Yes Historical Provider, MD  progesterone (PROMETRIUM) 100 MG capsule Take 100 mg by mouth daily.   Yes Historical Provider, MD  propranolol (INDERAL) 20 MG tablet Take 40 mg by mouth at bedtime.   Yes Historical Provider, MD  SUMAtriptan (IMITREX) 50 MG tablet Take 50 mg by mouth every 2 (two) hours as needed for migraine. May repeat in 2 hours if headache persists  or recurs.   Yes Historical Provider, MD  topiramate (TOPAMAX) 25 MG tablet Take 50 mg by mouth at bedtime.   Yes Historical Provider, MD  triamterene-hydrochlorothiazide (DYAZIDE) 37.5-25 MG capsule TAKE ONE CAPSULE BY MOUTH EVERY DAY 10/05/15  Yes Rita Ohara, MD  acetaminophen (TYLENOL) 500 MG tablet Take 1,000 mg by mouth every 6 (six) hours as needed for mild pain.     Historical Provider, MD  cetirizine (ZYRTEC) 10 MG tablet Take 10 mg by mouth  at bedtime.    Historical Provider, MD  clonazePAM (KLONOPIN) 0.5 MG tablet Take 0.5 mg by mouth at bedtime.     Historical Provider, MD  fluticasone (FLONASE) 50 MCG/ACT nasal spray Place 2 sprays into both nostrils daily. 05/16/15   Rita Ohara, MD  metoprolol (LOPRESSOR) 50 MG tablet Take 1 tablet (50 mg total) by mouth 2 (two) times daily. 07/25/15   Josue Hector, MD  Probiotic Product ([place]) CAPS Take 1 capsule by mouth daily.    Historical Provider, MD  promethazine (PHENERGAN) 25 MG suppository Place 25 mg rectally every 6 (six) hours as needed for nausea or vomiting.    Historical Provider, MD   Meds Ordered and Administered this Visit  Medications - No data to display  BP 114/69 mmHg  Pulse 109  Temp(Src) 98.2 F (36.8 C) (Oral)  Resp 22  SpO2 99%  LMP 07/27/2006 No data found.   Physical Exam  Constitutional: She is oriented to person, place, and time. She appears well-developed and well-nourished. No distress.  HENT:  Head: Normocephalic and atraumatic.  Right Ear: External ear normal.  Left Ear: External ear normal.  Eyes: Conjunctivae are normal.  Neck: Normal range of motion. Neck supple.  Cardiovascular: Normal rate.   Pulmonary/Chest: Effort normal and breath sounds normal.  Abdominal: Soft. Bowel sounds are normal.  Musculoskeletal: Normal range of motion.  Neurological: She is alert and oriented to person, place, and time.  Skin: Skin is warm and dry.  Psychiatric: She has a normal mood and affect. Her  behavior is normal. Judgment and thought content normal.  Nursing note and vitals reviewed.   ED Course  Procedures (including critical care time)  Labs Review Labs Reviewed - No data to display  Imaging Review Dg Chest 2 View  11/27/2015  CLINICAL DATA:  Sick for 2 days with cough. EXAM: CHEST  2 VIEW COMPARISON:  09/2027. FINDINGS: Cardiomegaly. Thoracic atherosclerosis with calcification and tortuosity. No active infiltrates or failure. No effusion or pneumothorax. Cholecystectomy clips. No acute osseous findings. IMPRESSION: No active cardiopulmonary disease. Electronically Signed   By: Staci Righter M.D.   On: 11/27/2015 15:06     Visual Acuity Review  Right Eye Distance:   Left Eye Distance:   Bilateral Distance:    Right Eye Near:   Left Eye Near:    Bilateral Near:         MDM   1. Chest pain, unspecified chest pain type   2. Shortness of breath    Pt is advised that she would be best served in the ED. Pt is released to go to the ED under her own power.     Konrad Felix, [place] 11/27/15 [phone]

## 2015-11-27 NOTE — ED Notes (Addendum)
Pt sent here from ucc due to onset yesterday of cough, mid chest pains, dizziness and sob. Denies swelling to extremities.

## 2015-11-27 NOTE — ED Notes (Signed)
Started with mid-chest pain yesterday when she would bend forward, along with SOB and dizziness when getting up from sitting position.  Chest tightness seems to be more constant over past several hours.  Chest tightness worse with palpation.  Denies diaphoresis.  C/O dyspnea with exertion.  States feels like when she's had pneumonia in past.  Used inhaler today without any change.  Has been having slight cough.

## 2015-11-28 LAB — BRAIN NATRIURETIC PEPTIDE: B Natriuretic Peptide: 45.3 pg/mL (ref 0.0–100.0)

## 2015-11-28 MED ORDER — HYDROCODONE-ACETAMINOPHEN 5-325 MG PO TABS: 1.0000 | ORAL_TABLET | Freq: Four times a day (QID) | ORAL | Status: DC | PRN

## 2015-11-28 NOTE — Discharge Instructions (Signed)

## 2015-12-01 ENCOUNTER — Encounter: Payer: Self-pay | Admitting: Family Medicine

## 2015-12-01 ENCOUNTER — Other Ambulatory Visit: Payer: Self-pay | Admitting: *Deleted

## 2015-12-01 ENCOUNTER — Ambulatory Visit (INDEPENDENT_AMBULATORY_CARE_PROVIDER_SITE_OTHER): Payer: 59 | Admitting: Family Medicine

## 2015-12-01 VITALS — BP 112/70 | HR 76 | Ht 62.5 in | Wt 222.4 lb

## 2015-12-01 DIAGNOSIS — E559 Vitamin D deficiency, unspecified: Secondary | ICD-10-CM | POA: Diagnosis not present

## 2015-12-01 DIAGNOSIS — R0600 Dyspnea, unspecified: Secondary | ICD-10-CM

## 2015-12-01 DIAGNOSIS — Z1159 Encounter for screening for other viral diseases: Secondary | ICD-10-CM

## 2015-12-01 DIAGNOSIS — I10 Essential (primary) hypertension: Secondary | ICD-10-CM | POA: Diagnosis not present

## 2015-12-01 DIAGNOSIS — Z0000 Encounter for general adult medical examination without abnormal findings: Secondary | ICD-10-CM

## 2015-12-01 DIAGNOSIS — R7301 Impaired fasting glucose: Secondary | ICD-10-CM

## 2015-12-01 DIAGNOSIS — G2581 Restless legs syndrome: Secondary | ICD-10-CM | POA: Diagnosis not present

## 2015-12-01 DIAGNOSIS — E7800 Pure hypercholesterolemia, unspecified: Secondary | ICD-10-CM | POA: Diagnosis not present

## 2015-12-01 DIAGNOSIS — G4733 Obstructive sleep apnea (adult) (pediatric): Secondary | ICD-10-CM | POA: Diagnosis not present

## 2015-12-01 DIAGNOSIS — E785 Hyperlipidemia, unspecified: Secondary | ICD-10-CM | POA: Diagnosis not present

## 2015-12-01 DIAGNOSIS — Z5181 Encounter for therapeutic drug level monitoring: Secondary | ICD-10-CM

## 2015-12-01 DIAGNOSIS — K3184 Gastroparesis: Secondary | ICD-10-CM

## 2015-12-01 DIAGNOSIS — M797 Fibromyalgia: Secondary | ICD-10-CM

## 2015-12-01 DIAGNOSIS — J309 Allergic rhinitis, unspecified: Secondary | ICD-10-CM | POA: Diagnosis not present

## 2015-12-01 LAB — POCT URINALYSIS DIPSTICK
Bilirubin, UA: NEGATIVE
Blood, UA: NEGATIVE
Glucose, UA: NEGATIVE
Ketones, UA: NEGATIVE
Leukocytes, UA: NEGATIVE
Nitrite, UA: NEGATIVE
Protein, UA: NEGATIVE
Spec Grav, UA: 1.025
Urobilinogen, UA: NEGATIVE
pH, UA: 6

## 2015-12-01 MED ORDER — CLONAZEPAM 0.5 MG PO TABS: 0.5000 mg | ORAL_TABLET | Freq: Every day | ORAL | Status: DC

## 2015-12-01 NOTE — Patient Instructions (Signed)
  HEALTH MAINTENANCE RECOMMENDATIONS:  It is recommended that you get at least 30 minutes of aerobic exercise at least 5 days/week (for weight loss, you may need as much as 60-90 minutes). This can be any activity that gets your heart rate up. This can be divided in 10-15 minute intervals if needed, but try and build up your endurance at least once a week.  Weight bearing exercise is also recommended twice weekly.  Eat a healthy diet with lots of vegetables, fruits and fiber.  "Colorful" foods have a lot of vitamins (ie green vegetables, tomatoes, red peppers, etc).  Limit sweet tea, regular sodas and alcoholic beverages, all of which has a lot of calories and sugar.  Up to 1 alcoholic drink daily may be beneficial for women (unless trying to lose weight, watch sugars).  Drink a lot of water.  Calcium recommendations are 1200-1500 mg daily (1500 mg for postmenopausal women or women without ovaries), and vitamin D 1000 IU daily.  This should be obtained from diet and/or supplements (vitamins), and calcium should not be taken all at once, but in divided doses.  Monthly self breast exams and yearly mammograms for women over the age of 77 is recommended.  Sunscreen of at least SPF 30 should be used on all sun-exposed parts of the skin when outside between the hours of 10 am and 4 pm (not just when at beach or pool, but even with exercise, golf, tennis, and yard work!)  Use a sunscreen that says "broad spectrum" so it covers both UVA and UVB rays, and make sure to reapply every 1-2 hours.  Remember to change the batteries in your smoke detectors when changing your clock times in the spring and fall.  Use your seat belt every time you are in a car, and please drive safely and not be distracted with cell phones and texting while driving.   Continue all of your current medications. Use the albuterol as needed. Return for re-evaluation if worsening/persistent shortness of breath. Come soon for fasting  labs.
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
## 2015-12-05 ENCOUNTER — Telehealth: Payer: Self-pay | Admitting: Family Medicine

## 2015-12-05 MED ORDER — PREDNISONE 20 MG PO TABS: 20.0000 mg | ORAL_TABLET | Freq: Two times a day (BID) | ORAL | Status: DC

## 2015-12-05 NOTE — Telephone Encounter (Signed)
See if she has been using her albuterol, and if she gets any temporary benefit.  Ensure no fevers. Okay to try prednisone 20mg  BID x 5 days, but needs to f/u if not improving with this.

## 2015-12-05 NOTE — Telephone Encounter (Signed)
Spoke with patient and she has been using her albuterol with some relief. She has had NO fevers. Sent rx for prednisone to [facility].

## 2015-12-05 NOTE — Telephone Encounter (Signed)
Pt called and stated that her breathing is getting worse. She states she is short of breath and wheezing. She states you told her it would take a while to get better but she wanted you to know its getting worse. Pt uses [facility] and and can be reached at [phone].

## 2015-12-07 ENCOUNTER — Other Ambulatory Visit: Payer: 59

## 2015-12-07 DIAGNOSIS — Z1159 Encounter for screening for other viral diseases: Secondary | ICD-10-CM

## 2015-12-07 DIAGNOSIS — Z5181 Encounter for therapeutic drug level monitoring: Secondary | ICD-10-CM

## 2015-12-07 DIAGNOSIS — E559 Vitamin D deficiency, unspecified: Secondary | ICD-10-CM

## 2015-12-07 DIAGNOSIS — R7301 Impaired fasting glucose: Secondary | ICD-10-CM

## 2015-12-07 DIAGNOSIS — E7800 Pure hypercholesterolemia, unspecified: Secondary | ICD-10-CM

## 2015-12-07 DIAGNOSIS — I10 Essential (primary) hypertension: Secondary | ICD-10-CM

## 2015-12-07 LAB — COMPREHENSIVE METABOLIC PANEL
ALT: 14 U/L (ref 6–29)
AST: 13 U/L (ref 10–35)
Albumin: 4 g/dL (ref 3.6–5.1)
Alkaline Phosphatase: 121 U/L (ref 33–130)
BUN: 28 mg/dL — ABNORMAL HIGH (ref 7–25)
CO2: 30 mmol/L (ref 20–31)
Calcium: 9.2 mg/dL (ref 8.6–10.4)
Chloride: 101 mmol/L (ref 98–110)
Creat: 0.89 mg/dL (ref 0.50–1.05)
Glucose, Bld: 96 mg/dL (ref 65–99)
Potassium: 4 mmol/L (ref 3.5–5.3)
Sodium: 140 mmol/L (ref 135–146)
Total Bilirubin: 0.2 mg/dL (ref 0.2–1.2)
Total Protein: 7.2 g/dL (ref 6.1–8.1)

## 2015-12-07 LAB — LIPID PANEL
Cholesterol: 162 mg/dL (ref 125–200)
HDL: 52 mg/dL (ref >=46)
LDL Cholesterol: 91 mg/dL (ref <130)
Total CHOL/HDL Ratio: 3.1 Ratio (ref <=5.0)
Triglycerides: 97 mg/dL (ref <150)
VLDL: 19 mg/dL (ref <30)

## 2015-12-08 LAB — HEMOGLOBIN A1C
Hgb A1c MFr Bld: 6.3 % — ABNORMAL HIGH (ref <5.7)
Mean Plasma Glucose: 134 mg/dL — ABNORMAL HIGH (ref <117)

## 2015-12-08 LAB — CBC WITH DIFFERENTIAL/PLATELET
Basophils Absolute: 0 10*3/uL (ref 0.0–0.1)
Basophils Relative: 0 % (ref 0–1)
Eosinophils Absolute: 0.2 10*3/uL (ref 0.0–0.7)
Eosinophils Relative: 1 % (ref 0–5)
HCT: 41.1 % (ref 36.0–46.0)
Hemoglobin: 13.3 g/dL (ref 12.0–15.0)
Lymphocytes Relative: 19 % (ref 12–46)
Lymphs Abs: 3.4 10*3/uL (ref 0.7–4.0)
MCH: 27.2 pg (ref 26.0–34.0)
MCHC: 32.4 g/dL (ref 30.0–36.0)
MCV: 84 fL (ref 78.0–100.0)
MPV: 9.8 fL (ref 8.6–12.4)
Monocytes Absolute: 1.4 10*3/uL — ABNORMAL HIGH (ref 0.1–1.0)
Monocytes Relative: 8 % (ref 3–12)
Neutro Abs: 12.8 10*3/uL — ABNORMAL HIGH (ref 1.7–7.7)
Neutrophils Relative %: 72 % (ref 43–77)
Platelets: 513 10*3/uL — ABNORMAL HIGH (ref 150–400)
RBC: 4.89 MIL/uL (ref 3.87–5.11)
RDW: 16.2 % — ABNORMAL HIGH (ref 11.5–15.5)
WBC: 17.8 10*3/uL — ABNORMAL HIGH (ref 4.0–10.5)

## 2015-12-08 LAB — VITAMIN D 25 HYDROXY (VIT D DEFICIENCY, FRACTURES): Vit D, 25-Hydroxy: 32 ng/mL (ref 30–100)

## 2015-12-08 LAB — HEPATITIS C ANTIBODY: HCV Ab: NEGATIVE

## 2015-12-12 ENCOUNTER — Encounter: Payer: Self-pay | Admitting: Family Medicine

## 2015-12-12 ENCOUNTER — Ambulatory Visit (INDEPENDENT_AMBULATORY_CARE_PROVIDER_SITE_OTHER): Payer: 59 | Admitting: Family Medicine

## 2015-12-12 VITALS — BP 120/72 | HR 80 | Ht 62.75 in | Wt 216.4 lb

## 2015-12-12 DIAGNOSIS — N3000 Acute cystitis without hematuria: Secondary | ICD-10-CM | POA: Diagnosis not present

## 2015-12-12 DIAGNOSIS — R7303 Prediabetes: Secondary | ICD-10-CM | POA: Diagnosis not present

## 2015-12-12 DIAGNOSIS — R350 Frequency of micturition: Secondary | ICD-10-CM | POA: Diagnosis not present

## 2015-12-12 DIAGNOSIS — R0600 Dyspnea, unspecified: Secondary | ICD-10-CM | POA: Diagnosis not present

## 2015-12-12 DIAGNOSIS — J069 Acute upper respiratory infection, unspecified: Secondary | ICD-10-CM

## 2015-12-12 DIAGNOSIS — J3489 Other specified disorders of nose and nasal sinuses: Secondary | ICD-10-CM | POA: Diagnosis not present

## 2015-12-12 LAB — POCT URINALYSIS DIPSTICK
Bilirubin, UA: NEGATIVE
Blood, UA: NEGATIVE
Glucose, UA: NEGATIVE
Nitrite, UA: NEGATIVE
Spec Grav, UA: >=1.03
Urobilinogen, UA: NEGATIVE
pH, UA: 6

## 2015-12-12 MED ORDER — LEVOFLOXACIN 500 MG PO TABS: 500.0000 mg | ORAL_TABLET | Freq: Every day | ORAL | Status: DC

## 2015-12-12 NOTE — Patient Instructions (Signed)
Take the antibiotic once daily for 5 days. If you aren't better by Wed/Thurs of this week, call us. We should have urine culture results by the end of the week, and can change the antibiotic accordingly at that time.  There is a potential interaction with the nortriptylene--try and avoid taking it or cut back to just 1/night, if needed, during these 5 days.  You have a small crusted area in the nose--use nasal saline frequently throughout the day. Continue the antibacterial ointment. Let us know if it becomes painful.  The antibiotic should also cover any respiratory infection. Your breathings seems much better since the steroids--let us know if you have recurrent problems with your breathing, or consistent problems with exercise.  Try and lose weight, as well as get daily exercise to help keep the sugars down and avoid diabetes.  Do the best you can to limit sweets and carbs.

## 2015-12-12 NOTE — Progress Notes (Signed)
Chief Complaint  Patient presents with  . Follow-up    on shortness of breath. Also has a sore place inside left nostril that she would like you to look at.   . Urinary Frequency    and urgency as well as some pressure that started over the weekend.    Urinary symptoms--2 days ago she started with pressure in suprapubic area, and some urinary urgency. Denies any blood in urine, odor or cloudiness.  She has discomfort with voiding.  Shortness of breath--she called 1/9 with complaints of worsening shortness of breath and wheezing.  Albuterol had helped some. She was given 5d of prednisone 20mg   BID, which helped.  She completed the prednisone last night, and so far still feels okay.  Denies any fevers or significant cough (just a tickle while here in the office), no sinus pressure/pain.  No further chest pain. 2 days ago while walking she had a little bit of shortness of breath with walking, but resolved on its own.  She has continued to walk without any DOE since then.  She has a spot in her right nare that "isn't right".  After blowing her nose this morning she used a Q-tip to apply neosporin. She noticed blood on the Q-tip.  Otherwise hasn't noticed any bloody drainage.  She has been using neosporin on this area for a couple of weeks.  Denies pain or rawness, just "bothersome".   Recent labs were reviewed--see below  PMH, PSH, SH reviewed. Meds and allergies reviewed and updated  ROS: no fever, chills, headaches, dizziness, chest pain, palpitations.  Cough and shortness of breath improved.  +urinary complaints as per HPI. No bleeding, bruising, rash, moods are good.  PHYSICAL EXAM: BP 120/72 mmHg  Pulse 80  Ht 5' 2.75" (1.594 m)  Wt 216 lb 6.4 oz (98.158 kg)  BMI 38.63 kg/m2  LMP 07/27/2006  Well developed, pleasant female in no distress.  Occasional sniffle and dry cough (says she has a tickle in her throat, just today) HEENT: PERRL, EOMI, conjunctiva clear.  Nasal mucosa is mildly  edematous.  There is a crusted fissure at the right nares, inferiorly, not at the opening, but deeper inside the nose.  No purulence, erythema or significant edema.  OP is clear. Sinuses are nontender Neck: no lymphadenopathy or mass Heart: regular rate and rhythm without murmur Lungs: clear bilaterally with good air movement Abdomen: soft, nontender--very mild in epigastrium. No suprapubic tenderness. obese Back: no CVA tenderness Skin: normal turgor, no rash Psych: normal mood, affect, hygiene and grooming Neuro: alert and oriented. Cranial nerves intact, normal strength, gait  Urine dip: 3+ leuks, trace protein. Neg glucose. Trace ketones. SG >1.030  ASSESSMENT/PLAN  Acute cystitis without hematuria - send urine for culture; treat with levaquin to cover both upper resp and urinary infection - Plan: Urine culture, levofloxacin (LEVAQUIN) 500 MG tablet  Urinary frequency - Plan: POCT Urinalysis Dipstick  Acute upper respiratory infection - resolved--dyspnea better after steroids. High WBC due to infection +/- steroids. cover with ABX  Dyspnea - resolved s/p prednisone course  Sore in nose - frequent nasal saline spray; continue bacitracin  Prediabetes - counseled re: weight loss, exercise and diet  Pre-diabetes--counseled re: diet (limited in changes), and need for weight loss and exercise.  Interaction between nortriptylene and levaquin--advised to either try doing without, vs lowering dose during the 5 d of antibiotics  25-30 min visit, more than 1/2 spent counseling.

## 2015-12-14 ENCOUNTER — Telehealth: Payer: Self-pay | Admitting: Family Medicine

## 2015-12-14 LAB — URINE CULTURE: Colony Count: >=100000

## 2015-12-14 NOTE — Telephone Encounter (Signed)
Patient advised.

## 2015-12-14 NOTE — Telephone Encounter (Signed)
Advise pt that culture did show infection, and that the levaquin that was prescribed should treat it

## 2015-12-14 NOTE — Telephone Encounter (Signed)
Pt called to see if results of culture was back yet. She had a bad day and night yesterday and last night. She was coughing a lot and having to use the bathroom a lot so this kept her up through the night. This morning she had a low grade fever of 99.4 which is unusual for her however she feels a little better.

## 2015-12-15 ENCOUNTER — Telehealth: Payer: Self-pay | Admitting: *Deleted

## 2015-12-15 NOTE — Telephone Encounter (Signed)
Patient advised.

## 2015-12-15 NOTE — Telephone Encounter (Signed)
Patient called and she is still feeling badly. Nasal congestion, wheezing, coughing, achy and just feels "yucky." Has had low grade temp of around 99.4. Finished prednisone this past Sunday and has one Levaquin left. She states that she feel to badly to even drive. Mucus is clear.

## 2015-12-15 NOTE — Telephone Encounter (Signed)
It is possible this is just a bad virus. The levaquin would cover any sinus or lung infection.  It is also treating the urinary infection.  Drink plenty of fluids, and get rest. ER if fever >101, shortness of breath, chest pain. Otherwise, just continue with supportive measures (mucinex--plain or DM, coricidin HBP for congestion, etc).

## 2015-12-21 ENCOUNTER — Encounter: Payer: Self-pay | Admitting: Family Medicine

## 2015-12-21 ENCOUNTER — Ambulatory Visit (INDEPENDENT_AMBULATORY_CARE_PROVIDER_SITE_OTHER): Payer: 59 | Admitting: Family Medicine

## 2015-12-21 ENCOUNTER — Telehealth: Payer: Self-pay | Admitting: Family Medicine

## 2015-12-21 VITALS — BP 112/72 | HR 100 | Temp 99.2°F | Ht 62.75 in | Wt 220.0 lb

## 2015-12-21 DIAGNOSIS — R062 Wheezing: Secondary | ICD-10-CM | POA: Diagnosis not present

## 2015-12-21 DIAGNOSIS — R509 Fever, unspecified: Secondary | ICD-10-CM | POA: Diagnosis not present

## 2015-12-21 DIAGNOSIS — J019 Acute sinusitis, unspecified: Secondary | ICD-10-CM

## 2015-12-21 DIAGNOSIS — R52 Pain, unspecified: Secondary | ICD-10-CM

## 2015-12-21 LAB — POC INFLUENZA A&B (BINAX/QUICKVUE)
Influenza A, POC: NEGATIVE
Influenza B, POC: NEGATIVE

## 2015-12-21 MED ORDER — CLARITHROMYCIN 500 MG PO TABS: 500.0000 mg | ORAL_TABLET | Freq: Two times a day (BID) | ORAL | Status: DC

## 2015-12-21 MED ORDER — ALBUTEROL SULFATE (2.5 MG/3ML) 0.083% IN NEBU
2.5000 mg | INHALATION_SOLUTION | Freq: Once | RESPIRATORY_TRACT | Status: AC
  Administered 2015-12-21: 2.5 mg via RESPIRATORY_TRACT

## 2015-12-21 NOTE — Telephone Encounter (Signed)
Pt says she seemed to have gotten a little better last week but now worse again. Breathing is bad. Has difficulty breathing especially when she goes outside and does any activity. Has cough and cough was much worse last night, still has low grade fever, fever was 99.2 last night, has body aches. Does she need to be seen again today?

## 2015-12-21 NOTE — Progress Notes (Signed)
Chief Complaint  Patient presents with  . Cough    started coughing more last night and had a slight temp.(highest 99.8) Felt achy and back hurt. Feels like she is wheezing and is SOB. (on exertion or with talking).    1/17 she started coughing at night, not much during the day.  She had low grade fever, runny nose (clear), felt achey.  She then started to get better earlier this week (1/22, 23).  Last night, when laying down she felt like things were "closing up"--wheezing, rattle in her chest when laying flat.  She used albuterol, which helped. She wasn't comfortable trying to sleep sitting up. Lowgrade fever came back yesterday afternoon. She still has some nasal congestion (still clear).   Had some DOE with walking yesterday, relieved by inhaler. +headaches over right eye and right temple Just completed Levaquin for UTI. Urinary symptoms resolved.  PMH, PSH, SH reviewed.  meds reviewed: Outpatient Encounter Prescriptions as of 12/21/2015  Medication Sig Note  . acetaminophen (TYLENOL) 500 MG tablet Take 1,000 mg by mouth every 6 (six) hours as needed for mild pain. Reported on 12/12/2015 12/21/2015: Last dose 10:00am.  . albuterol (PROVENTIL HFA;VENTOLIN HFA) 108 (90 Base) MCG/ACT inhaler Inhale into the lungs every 6 (six) hours as needed for wheezing or shortness of breath. Reported on 12/12/2015   . ALPRAZolam (XANAX) 0.25 MG tablet Take 0.25 mg by mouth 3 (three) times daily as needed for anxiety.  12/01/2015: Up to 3x/week  . ARIPiprazole (ABILIFY) 5 MG tablet Take 5 mg by mouth every morning. Nausea & anxiety 08/23/2015: -  . Armodafinil (NUVIGIL) 250 MG tablet Take 250 mg by mouth every morning. Reported on 12/21/2015   . atorvastatin (LIPITOR) 40 MG tablet TAKE 1 TABLET (40 MG TOTAL) BY MOUTH DAILY.   . cetirizine (ZYRTEC) 10 MG tablet Take 10 mg by mouth at bedtime.   . clonazePAM (KLONOPIN) 0.5 MG tablet Take 1-2 tablets (0.5-1 mg total) by mouth at bedtime.   . DULoxetine (CYMBALTA) 60  MG capsule Take 60 mg by mouth 2 (two) times daily.   Marland Kitchen estradiol (VIVELLE-DOT) 0.05 MG/24HR patch PLACE 1 PATCH (0.05 MG TOTAL) ONTO THE SKIN 2 (TWO) TIMES A WEEK. 10/24/2015: Received from: External Pharmacy  . fluticasone (FLONASE) 50 MCG/ACT nasal spray Place 2 sprays into both nostrils daily.   Marland Kitchen guaiFENesin (MUCINEX) 600 MG 12 hr tablet Take 1,200 mg by mouth 2 (two) times daily.   Marland Kitchen LYRICA 75 MG capsule Take 75 mg by mouth 2 (two) times daily. 08/24/2015: -  . metoprolol (LOPRESSOR) 50 MG tablet Take 1 tablet (50 mg total) by mouth 2 (two) times daily.   . Multiple Vitamins-Minerals (PRESERVISION AREDS PO) Take 1 capsule by mouth daily.   . nortriptyline (PAMELOR) 50 MG capsule Take 100 mg by mouth at bedtime.    . ondansetron (ZOFRAN) 8 MG tablet Take 8 mg by mouth every 8 (eight) hours as needed for nausea or vomiting. Reported on 12/12/2015   . pantoprazole (PROTONIX) 40 MG tablet Take 40 mg by mouth 2 (two) times daily.   . Probiotic Product ([place]) CAPS Take 1 capsule by mouth daily.   . progesterone (PROMETRIUM) 100 MG capsule Take 100 mg by mouth daily.   Marland Kitchen topiramate (TOPAMAX) 25 MG tablet Take 50 mg by mouth at bedtime. Reported on 12/21/2015   . triamterene-hydrochlorothiazide (DYAZIDE) 37.5-25 MG capsule TAKE ONE CAPSULE BY MOUTH EVERY DAY   . clarithromycin (BIAXIN) 500 MG tablet Take  1 tablet (500 mg total) by mouth 2 (two) times daily.   . meloxicam (MOBIC) 15 MG tablet Reported on 12/21/2015 12/12/2015: Received from: External Pharmacy  . methocarbamol (ROBAXIN) 500 MG tablet Take 1 tablet (500 mg total) by mouth every 6 (six) hours as needed for muscle spasms. (Patient not taking: Reported on 12/21/2015) 12/01/2015: Uses prn, not daily  . promethazine (PHENERGAN) 25 MG suppository Place 25 mg rectally every 6 (six) hours as needed for nausea or vomiting. Reported on 12/21/2015   . SUMAtriptan (IMITREX) 50 MG tablet Take 50 mg by mouth every 2 (two) hours as needed for  migraine. Reported on 12/21/2015   . [DISCONTINUED] levofloxacin (LEVAQUIN) 500 MG tablet Take 1 tablet (500 mg total) by mouth daily.   . [EXPIRED] albuterol (PROVENTIL) (2.5 MG/3ML) 0.083% nebulizer solution 2.5 mg     No facility-administered encounter medications on file as of 12/21/2015.   (biaxin rx'd today, not prior to visit)  Allergies  Allergen Reactions  . Erythromycin Nausea Only  . Tramadol Itching  . Dilaudid [Hydromorphone Hcl] Itching   ROS: +sinus headache, rhinorrhea, cough, wheezing and shortness of breath per HPI. Nausea/GI complaints stable as per baseline. No bleeding, bruising, rash, urinary complaints or other concerns except as noted in HPI.  PHYSICAL EXAM:  BP 112/72 mmHg  Pulse 100  Temp(Src) 99.2 F (37.3 C) (Tympanic)  Ht 5' 2.75" (1.594 m)  Wt 220 lb (99.791 kg)  BMI 39.27 kg/m2  SpO2 97%  LMP 07/27/2006 Well appearing female in no distress.  Speaking easily in full sentences. No coughing or sniffling during visit HEENT: PERRL, EOMI, conjunctiva clear. Nasal mucosa mild-mod edematous, with erythema R>L. White mucus noted on the right Tender at right cheek/maxillary sinus. OP is clear Neck: no lymphadenopathy or mass Heart: regular rate and rhythm Lungs: Slight ronchi and wheezing noted bilaterally posteriorly. Fair air movement.  After nebulizer treatment, this resolved, and lungs were clear. Extremities: no edema Skin: normal turgor, no rash Psych: normal mood, affect, hygiene and grooming  ASSESSMENT/PLAN:  Acute sinusitis, recurrence not specified, unspecified location - Plan: clarithromycin (BIAXIN) 500 MG tablet  Fever and chills - Plan: POC Influenza A&B (Binax test)  Body aches - Plan: POC Influenza A&B (Binax test)  Wheezing - Plan: albuterol (PROVENTIL) (2.5 MG/3ML) 0.083% nebulizer solution 2.5 mg  Influenza tests negative.  Treat sinusitis with Biaxin If not improving, change to Augmentin (pt recalls not tolerating well due to  side effects, so will avoid using first-line). She had good response to neb in office, so can avoid another course of steroids, and just have her use the albuterol prn.  Stop nortriptylene and atorvastatin while taking BIaxin. Supportive measures reviewed. Call Monday if not improving

## 2015-12-21 NOTE — Telephone Encounter (Signed)
Spoke with Dr Tomi Bamberger and pt need to bee seen so called her back and scheduled her to be seen today

## 2015-12-21 NOTE — Patient Instructions (Addendum)
Continue to use albuterol as needed for wheezing. Take the antibiotic as prescribed. I believe you have a sinus infection. Use guaifenesin (robitussin, Mucinex) to keep the mucus thin. Consider trying sinus rinses once or twice daily.  Stop nortriptylene and atorvastatin while taking the antibiotic.   Call us Monday if you aren't improving.  Go to the urgent care or ER over the weekend if you have increased shortness of breath, high fever, or other symptoms develop.

## 2015-12-22 ENCOUNTER — Inpatient Hospital Stay (HOSPITAL_COMMUNITY)
Admission: EM | Admit: 2015-12-22 | Discharge: 2015-12-26 | DRG: 072 | Disposition: A | Discharge status: Home Health | Payer: 59 | Attending: Internal Medicine | Admitting: Internal Medicine

## 2015-12-22 ENCOUNTER — Emergency Department (HOSPITAL_COMMUNITY): Payer: 59

## 2015-12-22 ENCOUNTER — Encounter (HOSPITAL_COMMUNITY): Payer: Self-pay | Admitting: *Deleted

## 2015-12-22 ENCOUNTER — Inpatient Hospital Stay (HOSPITAL_COMMUNITY): Payer: 59

## 2015-12-22 DIAGNOSIS — G9340 Encephalopathy, unspecified: Secondary | ICD-10-CM | POA: Diagnosis present

## 2015-12-22 DIAGNOSIS — Z800 Family history of malignant neoplasm of digestive organs: Secondary | ICD-10-CM | POA: Diagnosis not present

## 2015-12-22 DIAGNOSIS — R5382 Chronic fatigue, unspecified: Secondary | ICD-10-CM | POA: Diagnosis present

## 2015-12-22 DIAGNOSIS — I809 Phlebitis and thrombophlebitis of unspecified site: Secondary | ICD-10-CM | POA: Diagnosis present

## 2015-12-22 DIAGNOSIS — Z96651 Presence of right artificial knee joint: Secondary | ICD-10-CM | POA: Diagnosis present

## 2015-12-22 DIAGNOSIS — Z806 Family history of leukemia: Secondary | ICD-10-CM | POA: Diagnosis not present

## 2015-12-22 DIAGNOSIS — R61 Generalized hyperhidrosis: Secondary | ICD-10-CM | POA: Diagnosis not present

## 2015-12-22 DIAGNOSIS — K3184 Gastroparesis: Secondary | ICD-10-CM

## 2015-12-22 DIAGNOSIS — R062 Wheezing: Secondary | ICD-10-CM | POA: Diagnosis present

## 2015-12-22 DIAGNOSIS — G2579 Other drug induced movement disorders: Secondary | ICD-10-CM | POA: Diagnosis present

## 2015-12-22 DIAGNOSIS — Z885 Allergy status to narcotic agent status: Secondary | ICD-10-CM

## 2015-12-22 DIAGNOSIS — G253 Myoclonus: Secondary | ICD-10-CM | POA: Diagnosis present

## 2015-12-22 DIAGNOSIS — Z7952 Long term (current) use of systemic steroids: Secondary | ICD-10-CM | POA: Diagnosis not present

## 2015-12-22 DIAGNOSIS — M109 Gout, unspecified: Secondary | ICD-10-CM | POA: Diagnosis present

## 2015-12-22 DIAGNOSIS — K219 Gastro-esophageal reflux disease without esophagitis: Secondary | ICD-10-CM | POA: Diagnosis present

## 2015-12-22 DIAGNOSIS — R059 Cough, unspecified: Secondary | ICD-10-CM

## 2015-12-22 DIAGNOSIS — Z833 Family history of diabetes mellitus: Secondary | ICD-10-CM

## 2015-12-22 DIAGNOSIS — Z87442 Personal history of urinary calculi: Secondary | ICD-10-CM

## 2015-12-22 DIAGNOSIS — Z6835 Body mass index (BMI) 35.0-35.9, adult: Secondary | ICD-10-CM | POA: Diagnosis not present

## 2015-12-22 DIAGNOSIS — Z825 Family history of asthma and other chronic lower respiratory diseases: Secondary | ICD-10-CM

## 2015-12-22 DIAGNOSIS — R609 Edema, unspecified: Secondary | ICD-10-CM

## 2015-12-22 DIAGNOSIS — G2581 Restless legs syndrome: Secondary | ICD-10-CM | POA: Diagnosis present

## 2015-12-22 DIAGNOSIS — R05 Cough: Secondary | ICD-10-CM | POA: Diagnosis present

## 2015-12-22 DIAGNOSIS — G4733 Obstructive sleep apnea (adult) (pediatric): Secondary | ICD-10-CM | POA: Diagnosis present

## 2015-12-22 DIAGNOSIS — J309 Allergic rhinitis, unspecified: Secondary | ICD-10-CM | POA: Diagnosis present

## 2015-12-22 DIAGNOSIS — Z881 Allergy status to other antibiotic agents status: Secondary | ICD-10-CM

## 2015-12-22 DIAGNOSIS — E669 Obesity, unspecified: Secondary | ICD-10-CM | POA: Diagnosis present

## 2015-12-22 DIAGNOSIS — E785 Hyperlipidemia, unspecified: Secondary | ICD-10-CM | POA: Diagnosis present

## 2015-12-22 DIAGNOSIS — F419 Anxiety disorder, unspecified: Secondary | ICD-10-CM | POA: Diagnosis present

## 2015-12-22 DIAGNOSIS — Z87440 Personal history of urinary (tract) infections: Secondary | ICD-10-CM | POA: Diagnosis not present

## 2015-12-22 DIAGNOSIS — Z79899 Other long term (current) drug therapy: Secondary | ICD-10-CM | POA: Diagnosis not present

## 2015-12-22 DIAGNOSIS — R112 Nausea with vomiting, unspecified: Secondary | ICD-10-CM | POA: Diagnosis not present

## 2015-12-22 DIAGNOSIS — Z823 Family history of stroke: Secondary | ICD-10-CM

## 2015-12-22 DIAGNOSIS — R410 Disorientation, unspecified: Secondary | ICD-10-CM | POA: Diagnosis present

## 2015-12-22 DIAGNOSIS — J329 Chronic sinusitis, unspecified: Secondary | ICD-10-CM | POA: Diagnosis present

## 2015-12-22 DIAGNOSIS — Z8249 Family history of ischemic heart disease and other diseases of the circulatory system: Secondary | ICD-10-CM

## 2015-12-22 DIAGNOSIS — R9401 Abnormal electroencephalogram [EEG]: Secondary | ICD-10-CM | POA: Diagnosis present

## 2015-12-22 DIAGNOSIS — R4182 Altered mental status, unspecified: Secondary | ICD-10-CM | POA: Diagnosis not present

## 2015-12-22 DIAGNOSIS — F329 Major depressive disorder, single episode, unspecified: Secondary | ICD-10-CM | POA: Diagnosis present

## 2015-12-22 DIAGNOSIS — M797 Fibromyalgia: Secondary | ICD-10-CM

## 2015-12-22 DIAGNOSIS — Z841 Family history of disorders of kidney and ureter: Secondary | ICD-10-CM | POA: Diagnosis not present

## 2015-12-22 LAB — CBC
HCT: 37.5 % (ref 36.0–46.0)
Hemoglobin: 11.9 g/dL — ABNORMAL LOW (ref 12.0–15.0)
MCH: 27.2 pg (ref 26.0–34.0)
MCHC: 31.7 g/dL (ref 30.0–36.0)
MCV: 85.8 fL (ref 78.0–100.0)
Platelets: 356 10*3/uL (ref 150–400)
RBC: 4.37 MIL/uL (ref 3.87–5.11)
RDW: 16.9 % — ABNORMAL HIGH (ref 11.5–15.5)
WBC: 11 10*3/uL — ABNORMAL HIGH (ref 4.0–10.5)

## 2015-12-22 LAB — URINALYSIS, ROUTINE W REFLEX MICROSCOPIC
Bilirubin Urine: NEGATIVE
Glucose, UA: NEGATIVE mg/dL
Hgb urine dipstick: NEGATIVE
Ketones, ur: NEGATIVE mg/dL
Leukocytes, UA: NEGATIVE
Nitrite: NEGATIVE
Protein, ur: NEGATIVE mg/dL
Specific Gravity, Urine: 1.028 (ref 1.005–1.030)
pH: 5.5 (ref 5.0–8.0)

## 2015-12-22 LAB — COMPREHENSIVE METABOLIC PANEL
ALT: 19 U/L (ref 14–54)
AST: 16 U/L (ref 15–41)
Albumin: 3.5 g/dL (ref 3.5–5.0)
Alkaline Phosphatase: 97 U/L (ref 38–126)
Anion gap: 8 (ref 5–15)
BUN: 15 mg/dL (ref 6–20)
CO2: 24 mmol/L (ref 22–32)
Calcium: 8.6 mg/dL — ABNORMAL LOW (ref 8.9–10.3)
Chloride: 110 mmol/L (ref 101–111)
Creatinine, Ser: 0.78 mg/dL (ref 0.44–1.00)
GFR calc Af Amer: >60 mL/min (ref >60)
GFR calc non Af Amer: >60 mL/min (ref >60)
Glucose, Bld: 94 mg/dL (ref 65–99)
Potassium: 3.7 mmol/L (ref 3.5–5.1)
Sodium: 142 mmol/L (ref 135–145)
Total Bilirubin: 0.5 mg/dL (ref 0.3–1.2)
Total Protein: 6.8 g/dL (ref 6.5–8.1)

## 2015-12-22 LAB — RAPID URINE DRUG SCREEN, HOSP PERFORMED
Amphetamines: NOT DETECTED
Barbiturates: NOT DETECTED
Benzodiazepines: POSITIVE — AB
Cocaine: NOT DETECTED
Opiates: NOT DETECTED
Tetrahydrocannabinol: NOT DETECTED

## 2015-12-22 LAB — SALICYLATE LEVEL: Salicylate Lvl: <4 mg/dL (ref 2.8–30.0)

## 2015-12-22 LAB — AMMONIA: Ammonia: 18 umol/L (ref 9–35)

## 2015-12-22 LAB — ACETAMINOPHEN LEVEL: Acetaminophen (Tylenol), Serum: <10 ug/mL — ABNORMAL LOW (ref 10–30)

## 2015-12-22 LAB — I-STAT CG4 LACTIC ACID, ED
Lactic Acid, Venous: 1.25 mmol/L (ref 0.5–2.0)
Lactic Acid, Venous: 1.06 mmol/L (ref 0.5–2.0)

## 2015-12-22 LAB — VITAMIN B12: Vitamin B-12: 752 pg/mL (ref 180–914)

## 2015-12-22 LAB — CK: Total CK: 49 U/L (ref 38–234)

## 2015-12-22 LAB — C DIFFICILE QUICK SCREEN W PCR REFLEX
C Diff antigen: NEGATIVE
C Diff interpretation: NEGATIVE
C Diff toxin: NEGATIVE

## 2015-12-22 LAB — CBG MONITORING, ED: Glucose-Capillary: 83 mg/dL (ref 65–99)

## 2015-12-22 LAB — TSH: TSH: 1.797 u[IU]/mL (ref 0.350–4.500)

## 2015-12-22 LAB — MRSA PCR SCREENING: MRSA by PCR: NEGATIVE

## 2015-12-22 MED ORDER — CHLORHEXIDINE GLUCONATE 0.12 % MT SOLN
15.0000 mL | Freq: Two times a day (BID) | OROMUCOSAL | Status: DC
  Administered 2015-12-23 – 2015-12-26 (×7): 15 mL via OROMUCOSAL
  Filled 2015-12-22 (×6): qty 15

## 2015-12-22 MED ORDER — THIAMINE HCL 100 MG/ML IJ SOLN
100.0000 mg | Freq: Every day | INTRAMUSCULAR | Status: DC
  Administered 2015-12-22 – 2015-12-25 (×4): 100 mg via INTRAVENOUS
  Filled 2015-12-22 (×4): qty 2

## 2015-12-22 MED ORDER — SODIUM CHLORIDE 0.9 % IV SOLN
INTRAVENOUS | Status: DC
  Administered 2015-12-22 – 2015-12-25 (×4): via INTRAVENOUS

## 2015-12-22 MED ORDER — ONDANSETRON HCL 4 MG/2ML IJ SOLN
4.0000 mg | Freq: Four times a day (QID) | INTRAMUSCULAR | Status: DC | PRN
  Administered 2015-12-23 – 2015-12-25 (×6): 4 mg via INTRAVENOUS
  Filled 2015-12-22 (×6): qty 2

## 2015-12-22 MED ORDER — CETYLPYRIDINIUM CHLORIDE 0.05 % MT LIQD
7.0000 mL | Freq: Two times a day (BID) | OROMUCOSAL | Status: DC
  Administered 2015-12-23 – 2015-12-25 (×4): 7 mL via OROMUCOSAL

## 2015-12-22 MED ORDER — ACETAMINOPHEN 650 MG RE SUPP: 650.0000 mg | Freq: Four times a day (QID) | RECTAL | Status: DC | PRN

## 2015-12-22 MED ORDER — ONDANSETRON HCL 4 MG PO TABS: 4.0000 mg | ORAL_TABLET | Freq: Four times a day (QID) | ORAL | Status: DC | PRN

## 2015-12-22 MED ORDER — SODIUM CHLORIDE 0.9% FLUSH
3.0000 mL | Freq: Two times a day (BID) | INTRAVENOUS | Status: DC
  Administered 2015-12-22 – 2015-12-25 (×6): 3 mL via INTRAVENOUS

## 2015-12-22 MED ORDER — ACETAMINOPHEN 325 MG PO TABS
650.0000 mg | ORAL_TABLET | Freq: Four times a day (QID) | ORAL | Status: DC | PRN
  Administered 2015-12-23 – 2015-12-26 (×4): 650 mg via ORAL
  Filled 2015-12-22 (×4): qty 2

## 2015-12-22 MED ORDER — METOPROLOL TARTRATE 50 MG PO TABS
50.0000 mg | ORAL_TABLET | Freq: Two times a day (BID) | ORAL | Status: DC
  Administered 2015-12-23 – 2015-12-26 (×7): 50 mg via ORAL
  Filled 2015-12-22 (×2): qty 1
  Filled 2015-12-22 (×2): qty 2
  Filled 2015-12-22: qty 1
  Filled 2015-12-22: qty 2
  Filled 2015-12-22: qty 1

## 2015-12-22 MED ORDER — ALBUTEROL SULFATE (2.5 MG/3ML) 0.083% IN NEBU: 2.5000 mg | INHALATION_SOLUTION | RESPIRATORY_TRACT | Status: DC | PRN

## 2015-12-22 MED ORDER — ENOXAPARIN SODIUM 60 MG/0.6ML ~~LOC~~ SOLN
50.0000 mg | SUBCUTANEOUS | Status: DC
  Administered 2015-12-22 – 2015-12-25 (×4): 50 mg via SUBCUTANEOUS
  Filled 2015-12-22 (×5): qty 0.6

## 2015-12-22 NOTE — Consult Note (Signed)
Neurology Consultation Reason for Consult: Altered Mental Status Referring Physician: Eulis Foster  CC: decreased responsiveness.   History is obtained from:family, nursing.   HPI: [name] is a 60 y.o. female who has been having issues with wheezing, SOB for a few days who was started on levaquin/prednisone. Yesterday, her husband noticed that she was having significantly more tremor and became progressively more confused. She continued to be in the state this morning and therefore he brought her into the emergency room. He is not sure if she has taken her medicines last night or this morning. She states that she has not had any significant changes to her medicines, and has not been taking her Zofran more commonly than she typically does. She has not had any other major changes to her medicines recently.  Of note, she is on multiple serotonergic medications including duloxetine, nortriptyline, sumatriptan, ondansetron.  ROS:  Unable to obtain due to altered mental status.   Past Medical History  Diagnosis Date  . Fibromyalgia   . GERD (gastroesophageal reflux disease)   . Obstructive sleep apnea     On Cpap 2009  . Anxiety   . Depression   . Restless leg syndrome   . Edema   . Migraine   . Hyperlipidemia   . Anemia     previously followed by Dr. Jamse Arn for anemia and elevated platelets  . Paresthesia     Dr. Everette Rank at [facility]  . Tremor     Dr. Everette Rank  . Gastroparesis     followed at [facility]  . Pneumonia     2012  . Plantar fasciitis 02/2011    R foot  . S/P endometrial ablation 08/09/2006    Novasure Ablation  . C. difficile colitis 10/01/12    treated by [facility]  . Dyssynergia     dyssynergenic defecation, contributing to fecal incontinence.  . Kidney stone   . Pelvic floor dysfunction     pelvic floor dyssynergy  . Neuropathy ([place])   . DDD (degenerative disc disease), lumbar 08/19/14    and facet arthroplasty & left lumbar radiculopathy (Dr.Ramos)  . S/P  epidural steroid injection 09/20/14    Dr.Ramos  . PONV (postoperative nausea and vomiting)     pt states has gastroparesis has difficulty taking antibiotics and narcotics has severe nausea and vomiting   . Shortness of breath dyspnea     bending over; exertion   . Urinary frequency   . Urinary incontinence   . Chronic fatigue syndrome   . Lumbar radiculopathy      Family History  Problem Relation Age of Onset  . Allergies Mother   . Hypertension Mother   . Heart disease Mother     possible valve problem  . Heart disease Father     pacemaker, CHF  . Hypertension Father   . Diabetes Father     borderline  . Stroke Father 54  . Kidney disease Father   . Heart disease Paternal Grandmother   . Heart disease Paternal Grandfather   . Asthma Sister   . Irritable bowel syndrome Sister   . Allergies Sister   . Cancer Maternal Aunt     leukemia  . Cancer Maternal Aunt   . Colon cancer Maternal Aunt     late 46's     Social History:  reports that she has never smoked. She has never used smokeless tobacco. She reports that she does not drink alcohol or use illicit drugs.   Exam:  Current vital signs: BP 130/68 mmHg  Pulse 102  Temp(Src) 99.2 F (37.3 C) (Rectal)  Resp 16  SpO2 100%  LMP 07/27/2006 Vital signs in last 24 hours: Temp:  [98.6 F (37 C)-99.2 F (37.3 C)] 99.2 F (37.3 C) (01/26 1500) Pulse Rate:  [102] 102 (01/26 1401) Resp:  [16] 16 (01/26 1800) BP: (130-131)/(68-72) 130/68 mmHg (01/26 1800) SpO2:  [95 %-100 %] 100 % (01/26 1800)   Physical Exam  Constitutional: Appears well-developed and well-nourished.  Psych: Affect appropriate to situation Eyes: No scleral injection HENT: No OP obstrucion Head: Normocephalic.  Cardiovascular: Normal rate and regular rhythm.  Respiratory: Effort normal and breath sounds normal to anterior ascultation GI: Soft.  No distension. There is no tenderness.  Skin: WDI  Neuro: Mental Status: Patient is awake,  alert, she is not oriented to place month or year She is not able to give me clear history No signs of aphasia or neglect Cranial Nerves: II: Visual Fields are full. Pupils are equal, round, and reactive to light.   III,IV, VI: EOMI without ptosis or diploplia.  V: Facial sensation is symmetric to temperature VII: Facial movement is symmetric.  VIII: hearing is intact to voice X: Uvula elevates symmetrically XI: Shoulder shrug is symmetric. XII: tongue is midline without atrophy or fasciculations.  Motor: Tone is normal. Bulk is normal. 5/5 strength was present in all four extremities.  Sensory: Sensation is symmetric to light touch in the arms and legs Deep Tendon Reflexes: 3+ throughout, with markedly brisk jaw jerk and pectoral reflexes Plantars: Toes are downgoing bilaterally.  Cerebellar: She is markedly tremulous to the point where he has difficulty performing finger-nose-finger   I have reviewed labs in epic and the results pertinent to this consultation are: Ck normal TSH normal Lactate normal saliclate normal  I have reviewed the images obtained: CT head unremarkable  Impression: 60 year old female who presents with tachycardia, tremulousness, hyperreflexia, altered mental status in the setting of taking multiple serotonergic medications. Overall her clinical picture is most consistent with serotonin syndrome. If this is the case, then she should have relatively rapid improvement if her serotonergic medications are held. She is afebrile, and I would not favor further investigation at this time, but she did spike a fever then a umbar puncture may be necessary though I feel infectious causes much less likely.  Recommendations: 1) hold serotonergic medications 2) EEG 3) neurology will continue to follow   Roland Rack, MD [facility] [phone]  If 7pm- 7am, please page neurology on call as listed in [place].

## 2015-12-22 NOTE — ED Provider Notes (Signed)
CSN: [id number]     Arrival date & time 12/22/15  1346 History   First MD Initiated Contact with Patient 12/22/15 1503     Chief Complaint  Patient presents with  . Altered Mental Status     (Consider location/radiation/quality/duration/timing/severity/associated sxs/prior Treatment) HPI   25yF brought in by husband for evaluation of altered mental status. Before arrival the patient's husband found her on the floor the bathroom covered in stool and urine. Very confused. Sister at bedside as well. She reports that she tried to speaking with patient on the phone earlier today and could not follow her line of thought and all of her words just kept running together. Pt is currently confused to point that unable to provide useful history. Just diagnosed with sinusitis but did not yet start prescribed biaxin.   Past Medical History  Diagnosis Date  . Fibromyalgia   . GERD (gastroesophageal reflux disease)   . Obstructive sleep apnea     On Cpap 2009  . Anxiety   . Depression   . Restless leg syndrome   . Edema   . Migraine   . Hyperlipidemia   . Anemia     previously followed by Dr. Jamse Arn for anemia and elevated platelets  . Paresthesia     Dr. Everette Rank at [facility]  . Tremor     Dr. Everette Rank  . Gastroparesis     followed at [facility]  . Pneumonia     2012  . Plantar fasciitis 02/2011    R foot  . S/P endometrial ablation 08/09/2006    Novasure Ablation  . C. difficile colitis 10/01/12    treated by [facility]  . Dyssynergia     dyssynergenic defecation, contributing to fecal incontinence.  . Kidney stone   . Pelvic floor dysfunction     pelvic floor dyssynergy  . Neuropathy ([place])   . DDD (degenerative disc disease), lumbar 08/19/14    and facet arthroplasty & left lumbar radiculopathy (Dr.Ramos)  . S/P epidural steroid injection 09/20/14    Dr.Ramos  . PONV (postoperative nausea and vomiting)     pt states has gastroparesis has difficulty taking antibiotics and narcotics  has severe nausea and vomiting   . Shortness of breath dyspnea     bending over; exertion   . Urinary frequency   . Urinary incontinence   . Chronic fatigue syndrome   . Lumbar radiculopathy    Past Surgical History  Procedure Laterality Date  . Tonsillectomy  1968  . Knee surgery  1999    R knee, Dr. Eddie Dibbles, torn cartilage  . Cholecystectomy  9/05  . Endometrial ablation  08/09/2006    Dr. Valentina Shaggy Ablation  . Tonsillectomy    . Total knee arthroplasty Right 09/05/2015    Procedure: RIGHT TOTAL KNEE ARTHROPLASTY;  Surgeon: Gaynelle Arabian, MD;  Location: [facility];  Service: Orthopedics;  Laterality: Right;   Family History  Problem Relation Age of Onset  . Allergies Mother   . Hypertension Mother   . Heart disease Mother     possible valve problem  . Heart disease Father     pacemaker, CHF  . Hypertension Father   . Diabetes Father     borderline  . Stroke Father 31  . Kidney disease Father   . Heart disease Paternal Grandmother   . Heart disease Paternal Grandfather   . Asthma Sister   . Irritable bowel syndrome Sister   . Allergies Sister   . Cancer Maternal Aunt  leukemia  . Cancer Maternal Aunt   . Colon cancer Maternal Aunt     late 52's   Social History  Substance Use Topics  . Smoking status: Never Smoker   . Smokeless tobacco: Never Used  . Alcohol Use: No   OB History    Gravida Para Term Preterm AB TAB SAB Ectopic Multiple Living   1 1 1       1      Review of Systems  Level 5 caveat because of encephalopathy.    Allergies  Erythromycin; Tramadol; and Dilaudid  Home Medications   Prior to Admission medications   Medication Sig Start Date End Date Taking? Authorizing Provider  acetaminophen (TYLENOL) 500 MG tablet Take 1,000 mg by mouth every 6 (six) hours as needed for mild pain. Reported on 12/12/2015   Yes Historical Provider, MD  albuterol (PROVENTIL HFA;VENTOLIN HFA) 108 (90 Base) MCG/ACT inhaler Inhale into the lungs every 6  (six) hours as needed for wheezing or shortness of breath. Reported on 12/12/2015   Yes Historical Provider, MD  ALPRAZolam (XANAX) 0.25 MG tablet Take 0.25 mg by mouth 3 (three) times daily as needed for anxiety.    Yes Historical Provider, MD  ARIPiprazole (ABILIFY) 5 MG tablet Take 5 mg by mouth every morning. Nausea & anxiety 06/10/15  Yes Historical Provider, MD  Armodafinil (NUVIGIL) 250 MG tablet Take 250 mg by mouth every morning. Reported on 12/21/2015   Yes Historical Provider, MD  atorvastatin (LIPITOR) 40 MG tablet TAKE 1 TABLET (40 MG TOTAL) BY MOUTH DAILY. Patient taking differently: Take 1 tablet (40 mg total) by mouth daily. 12/06/14  Yes Rita Ohara, MD  cetirizine (ZYRTEC) 10 MG tablet Take 10 mg by mouth at bedtime.   Yes Historical Provider, MD  clarithromycin (BIAXIN) 500 MG tablet Take 1 tablet (500 mg total) by mouth 2 (two) times daily. 12/21/15  Yes Rita Ohara, MD  clonazePAM (KLONOPIN) 0.5 MG tablet Take 1-2 tablets (0.5-1 mg total) by mouth at bedtime. 12/01/15  Yes Rita Ohara, MD  DULoxetine (CYMBALTA) 60 MG capsule Take 60 mg by mouth 2 (two) times daily.   Yes Historical Provider, MD  estradiol (VIVELLE-DOT) 0.05 MG/24HR patch Place 1 patch onto the skin twice a week. 10/13/15  Yes Historical Provider, MD  fluticasone (FLONASE) 50 MCG/ACT nasal spray Place 2 sprays into both nostrils daily. 05/16/15  Yes Rita Ohara, MD  guaiFENesin (MUCINEX) 600 MG 12 hr tablet Take 1,200 mg by mouth 2 (two) times daily.   Yes Historical Provider, MD  LYRICA 75 MG capsule Take 75 mg by mouth 2 (two) times daily. 06/29/15  Yes Historical Provider, MD  meloxicam (MOBIC) 15 MG tablet Take 15 mg by mouth daily. Reported on 12/21/2015 12/08/15  Yes Historical Provider, MD  methocarbamol (ROBAXIN) 500 MG tablet Take 1 tablet (500 mg total) by mouth every 6 (six) hours as needed for muscle spasms. Patient taking differently: Take 500 mg by mouth 2 (two) times daily. At 0700 and 1300. 09/06/15  Yes Arlee Muslim,  PA-C  metoprolol (LOPRESSOR) 50 MG tablet Take 1 tablet (50 mg total) by mouth 2 (two) times daily. 07/25/15  Yes Josue Hector, MD  Multiple Vitamins-Minerals (PRESERVISION AREDS PO) Take 1 capsule by mouth daily.   Yes Historical Provider, MD  nortriptyline (PAMELOR) 50 MG capsule Take 100 mg by mouth at bedtime.  10/18/15  Yes Historical Provider, MD  ondansetron (ZOFRAN) 8 MG tablet Take 8 mg by mouth every 8 (eight) hours  as needed for nausea or vomiting. Reported on 12/12/2015   Yes Historical Provider, MD  pantoprazole (PROTONIX) 40 MG tablet Take 40 mg by mouth 2 (two) times daily.   Yes Historical Provider, MD  Probiotic Product ([place]) CAPS Take 1 capsule by mouth daily.   Yes Historical Provider, MD  prochlorperazine (COMPAZINE) 10 MG tablet Take 10 mg by mouth every 6 (six) hours as needed for nausea or vomiting.   Yes Historical Provider, MD  progesterone (PROMETRIUM) 100 MG capsule Take 100 mg by mouth at bedtime.    Yes Historical Provider, MD  promethazine (PHENERGAN) 25 MG suppository Place 25 mg rectally every 6 (six) hours as needed for nausea or vomiting. Reported on 12/21/2015   Yes Historical Provider, MD  propranolol (INDERAL) 20 MG tablet Take 40 mg by mouth at bedtime.   Yes Historical Provider, MD  SUMAtriptan (IMITREX) 50 MG tablet Take 50 mg by mouth every 2 (two) hours as needed for migraine. Reported on 12/21/2015   Yes Historical Provider, MD  topiramate (TOPAMAX) 25 MG tablet Take 50 mg by mouth at bedtime. Reported on 12/21/2015   Yes Historical Provider, MD  triamterene-hydrochlorothiazide (DYAZIDE) 37.5-25 MG capsule TAKE ONE CAPSULE BY MOUTH EVERY DAY Patient taking differently: Take 1 capsule by mouth every day. 10/05/15  Yes Rita Ohara, MD  levofloxacin (LEVAQUIN) 500 MG tablet Take 500 mg by mouth daily. 12/12/15   Historical Provider, MD  predniSONE (DELTASONE) 20 MG tablet Take 20 mg by mouth 2 (two) times daily. 12/05/15   Historical Provider, MD    BP 131/72 mmHg  Pulse 102  Temp(Src) 98.6 F (37 C) (Oral)  Resp 16  SpO2 95%  LMP 07/27/2006 Physical Exam  Constitutional: She appears well-developed and well-nourished. No distress.  HENT:  Head: Normocephalic and atraumatic.  Eyes: Conjunctivae are normal. Right eye exhibits no discharge. Left eye exhibits no discharge.  Neck: Neck supple.  No nuchal rigidity  Cardiovascular: Normal rate, regular rhythm and normal heart sounds.  Exam reveals no gallop and no friction rub.   No murmur heard. Mild tachycardia  Pulmonary/Chest: Effort normal and breath sounds normal. No respiratory distress.  Abdominal: Soft. She exhibits no distension. There is no tenderness.  Musculoskeletal: She exhibits no edema or tenderness.  Neurological: She is alert.  Oriented to self only. Speech is somewhat slurred. Many answers are inappropriate. Stopped more than once in mid sentence and just looked around the room as if she was unaware that she was just speaking. Intermittent myoclonic jerking. CN 2-12 intact. Strength 5/5 b/l u/l extremities.  Increased muscle tone. Hyperreflexia.   Skin: Skin is warm and dry.  Nursing note and vitals reviewed.   ED Course  Procedures (including critical care time) Labs Review Labs Reviewed  COMPREHENSIVE METABOLIC PANEL - Abnormal; Notable for the following:    Calcium 8.6 (*)    All other components within normal limits  CBC - Abnormal; Notable for the following:    WBC 11.0 (*)    Hemoglobin 11.9 (*)    RDW 16.9 (*)    All other components within normal limits  ACETAMINOPHEN LEVEL - Abnormal; Notable for the following:    Acetaminophen (Tylenol), Serum <10 (*)    All other components within normal limits  URINE CULTURE  GASTROINTESTINAL PANEL BY PCR, STOOL (REPLACES STOOL CULTURE)  SALICYLATE LEVEL  URINALYSIS, ROUTINE W REFLEX MICROSCOPIC (NOT AT [facility])  CBG MONITORING, ED  I-STAT CG4 LACTIC ACID, ED    Imaging Review  Dg Chest 2  View  12/22/2015  CLINICAL DATA:  Productive cough for 1 week. EXAM: CHEST  2 VIEW COMPARISON:  November 27, 2015. FINDINGS: The heart size and mediastinal contours are within normal limits. Both lungs are clear. No pneumothorax or pleural effusion is noted. The visualized skeletal structures are unremarkable. IMPRESSION: No active cardiopulmonary disease. Electronically Signed   By: Marijo Conception, M.D.   On: 12/22/2015 15:17   Ct Head Wo Contrast  12/22/2015  CLINICAL DATA:  Altered mental status. EXAM: CT HEAD WITHOUT CONTRAST TECHNIQUE: Contiguous axial images were obtained from the base of the skull through the vertex without intravenous contrast. COMPARISON:  None available currently. FINDINGS: Bony calvarium is unremarkable. Minimal diffuse cortical atrophy is noted. No mass effect or midline shift is noted. Ventricular size is within normal limits. There is no evidence of mass lesion, hemorrhage or acute infarction. IMPRESSION: Minimal diffuse cortical atrophy. No acute intracranial abnormality seen. Electronically Signed   By: Marijo Conception, M.D.   On: 12/22/2015 15:16   I have personally reviewed and evaluated these images and lab results as part of my medical decision-making.   EKG Interpretation   Date/Time:  Thursday December 22 2015 14:05:38 EST Ventricular Rate:  100 PR Interval:  169 QRS Duration: 110 QT Interval:  372 QTC Calculation: 480 R Axis:   101 Text Interpretation:  Sinus tachycardia Right axis deviation Low voltage,  precordial leads Confirmed by Barkley Kratochvil  MD, Alyshia Kernan ([id number]) on 12/22/2015  4:11:57 PM      MDM   Final diagnoses:  Serotonin syndrome    59yF with altered mental status. Suspect she may have serotonin syndrome. She is not febrile but she does have altered mental status, tremor, increased tone and hyperreflexia. Diarrhea at home and again in ED which can be seen with it. On duloxetine, sumatriptan PRN and nortriptyline. Consider other toxidromes or  infectious etiology but I feel less likely.   Husband with no significant concern for possible intentional overdose. Will additionally check acetaminophen and salicylate levels though. EKG similar to previous. CT head w/o acute abnormality. Basic labs fairly unremarkable.   Discussed with neurology for evaluation. Will need admitted for observation so will discuss with medicine. Hold potentially offending medications. Supportive care at this time. Benzos PRN.     Virgel Manifold, MD 12/23/15 [phone]

## 2015-12-22 NOTE — ED Notes (Signed)
Bed: [id number] Expected date:  Expected time:  Means of arrival:  Comments: EMS- 60yo F, AMS/medication change

## 2015-12-22 NOTE — ED Notes (Addendum)
Per EMS, pt diagnosed with URI yesterday, has not started antibiotics yet. Pt appeared confused was unable to answer questions for EMS, which is not pt's norm. Pt's husband found pt covered in feces and urine on bathroom floor a couple of hours ago. Pt went to her PCP yesterday, told she had sinus infection. CBG 118.   Pt's husband states he noticed the pt behaving differently yesterday. Pt is able to answer questions during triage, is alert to person only.

## 2015-12-22 NOTE — H&P (Addendum)
[facility] History and Physical  [name] OYD:[id number] DOB: 1956-01-28 DOA: 12/22/2015   PCP: Vikki Ports, MD  Specialists: None  Chief Complaint: Confusion since yesterday  HPI: [name] is a 60 y.o. female with a past medical history of fibromyalgia, chronic fatigue syndrome, gastroparesis, restless leg syndrome who was diagnosed with a urinary tract infection about 10 days ago and was treated with Levaquin. Patient was seen by her primary care physician yesterday with complaints of low-grade fever, cough and was diagnosed with sinusitis. She was prescribed Biaxin. However, patient has not started taking that medication yet. Patient was brought in today by her husband due to worsening confusion. No history is available from the patient. All the history is available from the husband, along with her medical record. It appears that patient's confusion started last night. She slept through the night. This morning her husband noticed that she was confused. He went to work and came back to check on her at 9:30 and found her sitting on the chair staring off at a distance. He took her back to the bed. He came back to check on her around 54. She still appeared to be confused. And then when he came back, she was found on the floor of her bedroom in her underwear with stool and feces around on the floor. Apart from the Levaquin, prednisone and albuterol that she was recently given for upper respiratory infection and UTI, there has been no changes to her medications.  Home Medications: Prior to Admission medications   Medication Sig Start Date End Date Taking? Authorizing Provider  acetaminophen (TYLENOL) 500 MG tablet Take 1,000 mg by mouth every 6 (six) hours as needed for mild pain. Reported on 12/12/2015   Yes Historical Provider, MD  albuterol (PROVENTIL HFA;VENTOLIN HFA) 108 (90 Base) MCG/ACT inhaler Inhale into the lungs every 6 (six) hours as needed for wheezing or shortness of  breath. Reported on 12/12/2015   Yes Historical Provider, MD  ALPRAZolam (XANAX) 0.25 MG tablet Take 0.25 mg by mouth 3 (three) times daily as needed for anxiety.    Yes Historical Provider, MD  ARIPiprazole (ABILIFY) 5 MG tablet Take 5 mg by mouth every morning. Nausea & anxiety 06/10/15  Yes Historical Provider, MD  Armodafinil (NUVIGIL) 250 MG tablet Take 250 mg by mouth every morning. Reported on 12/21/2015   Yes Historical Provider, MD  atorvastatin (LIPITOR) 40 MG tablet TAKE 1 TABLET (40 MG TOTAL) BY MOUTH DAILY. Patient taking differently: Take 1 tablet (40 mg total) by mouth daily. 12/06/14  Yes Rita Ohara, MD  cetirizine (ZYRTEC) 10 MG tablet Take 10 mg by mouth at bedtime.   Yes Historical Provider, MD  clarithromycin (BIAXIN) 500 MG tablet Take 1 tablet (500 mg total) by mouth 2 (two) times daily. 12/21/15  Yes Rita Ohara, MD  clonazePAM (KLONOPIN) 0.5 MG tablet Take 1-2 tablets (0.5-1 mg total) by mouth at bedtime. 12/01/15  Yes Rita Ohara, MD  DULoxetine (CYMBALTA) 60 MG capsule Take 60 mg by mouth 2 (two) times daily.   Yes Historical Provider, MD  estradiol (VIVELLE-DOT) 0.05 MG/24HR patch Place 1 patch onto the skin twice a week. 10/13/15  Yes Historical Provider, MD  fluticasone (FLONASE) 50 MCG/ACT nasal spray Place 2 sprays into both nostrils daily. 05/16/15  Yes Rita Ohara, MD  guaiFENesin (MUCINEX) 600 MG 12 hr tablet Take 1,200 mg by mouth 2 (two) times daily.   Yes Historical Provider, MD  LYRICA 75 MG capsule Take 75 mg  by mouth 2 (two) times daily. 06/29/15  Yes Historical Provider, MD  meloxicam (MOBIC) 15 MG tablet Take 15 mg by mouth daily. Reported on 12/21/2015 12/08/15  Yes Historical Provider, MD  methocarbamol (ROBAXIN) 500 MG tablet Take 1 tablet (500 mg total) by mouth every 6 (six) hours as needed for muscle spasms. Patient taking differently: Take 500 mg by mouth 2 (two) times daily. At 0700 and 1300. 09/06/15  Yes Arlee Muslim, PA-C  metoprolol (LOPRESSOR) 50 MG tablet Take 1  tablet (50 mg total) by mouth 2 (two) times daily. 07/25/15  Yes Josue Hector, MD  Multiple Vitamins-Minerals (PRESERVISION AREDS PO) Take 1 capsule by mouth daily.   Yes Historical Provider, MD  nortriptyline (PAMELOR) 50 MG capsule Take 100 mg by mouth at bedtime.  10/18/15  Yes Historical Provider, MD  ondansetron (ZOFRAN) 8 MG tablet Take 8 mg by mouth every 8 (eight) hours as needed for nausea or vomiting. Reported on 12/12/2015   Yes Historical Provider, MD  pantoprazole (PROTONIX) 40 MG tablet Take 40 mg by mouth 2 (two) times daily.   Yes Historical Provider, MD  Probiotic Product (Meridian) CAPS Take 1 capsule by mouth daily.   Yes Historical Provider, MD  prochlorperazine (COMPAZINE) 10 MG tablet Take 10 mg by mouth every 6 (six) hours as needed for nausea or vomiting.   Yes Historical Provider, MD  progesterone (PROMETRIUM) 100 MG capsule Take 100 mg by mouth at bedtime.    Yes Historical Provider, MD  promethazine (PHENERGAN) 25 MG suppository Place 25 mg rectally every 6 (six) hours as needed for nausea or vomiting. Reported on 12/21/2015   Yes Historical Provider, MD  propranolol (INDERAL) 20 MG tablet Take 40 mg by mouth at bedtime.   Yes Historical Provider, MD  SUMAtriptan (IMITREX) 50 MG tablet Take 50 mg by mouth every 2 (two) hours as needed for migraine. Reported on 12/21/2015   Yes Historical Provider, MD  topiramate (TOPAMAX) 25 MG tablet Take 50 mg by mouth at bedtime. Reported on 12/21/2015   Yes Historical Provider, MD  triamterene-hydrochlorothiazide (DYAZIDE) 37.5-25 MG capsule TAKE ONE CAPSULE BY MOUTH EVERY DAY Patient taking differently: Take 1 capsule by mouth every day. 10/05/15  Yes Rita Ohara, MD  levofloxacin (LEVAQUIN) 500 MG tablet Take 500 mg by mouth daily. 12/12/15   Historical Provider, MD  predniSONE (DELTASONE) 20 MG tablet Take 20 mg by mouth 2 (two) times daily. 12/05/15   Historical Provider, MD    Allergies:  Allergies  Allergen Reactions  .  Erythromycin Nausea Only  . Tramadol Itching  . Dilaudid [Hydromorphone Hcl] Itching    Past Medical History: Past Medical History  Diagnosis Date  . Fibromyalgia   . GERD (gastroesophageal reflux disease)   . Obstructive sleep apnea     On Cpap 2009  . Anxiety   . Depression   . Restless leg syndrome   . Edema   . Migraine   . Hyperlipidemia   . Anemia     previously followed by Dr. Jamse Arn for anemia and elevated platelets  . Paresthesia     [facility] Everette [facility] at [facility]  . Tremor     Dr. Everette Rank  . Gastroparesis     followed at [facility]  . Pneumonia     2012  . Plantar fasciitis 02/2011    R foot  . S/P endometrial ablation 08/09/2006    Novasure Ablation  . C. difficile colitis 10/01/12    treated by [facility]  . Dyssynergia     dyssynergenic defecation, contributing to fecal incontinence.  . Kidney stone   . Pelvic floor dysfunction     pelvic floor dyssynergy  . Neuropathy ([place])   . DDD (degenerative disc disease), lumbar 08/19/14    and facet arthroplasty & left lumbar radiculopathy (Dr.Ramos)  . S/P epidural steroid injection 09/20/14    Dr.Ramos  . PONV (postoperative nausea and vomiting)     pt states has gastroparesis has difficulty taking antibiotics and narcotics has severe nausea and vomiting   . Shortness of breath dyspnea     bending over; exertion   . Urinary frequency   . Urinary incontinence   . Chronic fatigue syndrome   . Lumbar radiculopathy     Past Surgical History  Procedure Laterality Date  . Tonsillectomy  1968  . Knee surgery  1999    R knee, Dr. Eddie Dibbles, torn cartilage  . Cholecystectomy  9/05  . Endometrial ablation  08/09/2006    Dr. Valentina Shaggy Ablation  . Tonsillectomy    . Total knee arthroplasty Right 09/05/2015    Procedure: RIGHT TOTAL KNEE ARTHROPLASTY;  Surgeon: Gaynelle Arabian, MD;  Location: [facility];  Service: Orthopedics;  Laterality: Right;    Social History: Lives with her husband in [place]. No history of  smoking, alcohol use or illicit drug use. Usually independent with daily activities. She actually drove to her primary care physician's office on January 25.  Family History:  Family History  Problem Relation Age of Onset  . Allergies Mother   . Hypertension Mother   . Heart disease Mother     possible valve problem  . Heart disease Father     pacemaker, CHF  . Hypertension Father   . Diabetes Father     borderline  . Stroke Father 34  . Kidney disease Father   . Heart disease Paternal Grandmother   . Heart disease Paternal Grandfather   . Asthma Sister   . Irritable bowel syndrome Sister   . Allergies Sister   . Cancer Maternal Aunt     leukemia  . Cancer Maternal Aunt   . Colon cancer Maternal Aunt     late 60's     Review of Systems - unable to obtain due to her mental status  Physical Examination  Filed Vitals:   12/22/15 1401 12/22/15 1500  BP: 131/72   Pulse: 102   Temp: 98.6 F (37 C) 99.2 F (37.3 C)  TempSrc: Oral Rectal  Resp: 16   SpO2: 95%     BP 131/72 mmHg  Pulse 102  Temp(Src) 99.2 F (37.3 C) (Rectal)  Resp 16  SpO2 95%  LMP 07/27/2006  General appearance: alert, distracted, moderately obese and Follows commands Head: Normocephalic, without obvious abnormality, atraumatic Eyes: conjunctivae/corneas clear. PERRL, unable to cooperate with EOM's  Throat: Dry mucous membranes without any  oral lesions Neck: no adenopathy, no carotid bruit, no JVD, supple, symmetrical, trachea midline and thyroid not enlarged, symmetric, no tenderness/mass/nodules Resp: clear to auscultation bilaterally Cardio: regular rate and rhythm, S1, S2 normal, no murmur, click, rub or gallop GI: soft, non-tender; bowel sounds normal; no masses,  no organomegaly Extremities: extremities normal, no cyanosis or edema. Skin abrasions noted over the knees bilaterally Pulses: 2+ and symmetric Skin: Skin color, texture, turgor normal. No rashes or lesions Lymph nodes:  Cervical, supraclavicular, and axillary nodes normal. Neurologic: Awake and alert. Distracted. Confused and disoriented. Tremulous. Some myoclonic jerking activity noted. Tongue is midline.  No facial asymmetry. No clear pronator drift. Increased tone is appreciated. I did not appreciate hyperreflexia. Gait not assessed. Moving all her extremities without any focal deficits. Grossly normal  Laboratory Data: Results for orders placed or performed during the hospital encounter of 12/22/15 (from the past 48 hour(s))  CBG monitoring, ED     Status: None   Collection Time: 12/22/15  3:28 PM  Result Value Ref Range   Glucose-Capillary 83 65 - 99 mg/dL   Comment 1 Notify RN   Comprehensive metabolic panel     Status: Abnormal   Collection Time: 12/22/15  3:45 PM  Result Value Ref Range   Sodium 142 135 - 145 mmol/L   Potassium 3.7 3.5 - 5.1 mmol/L   Chloride 110 101 - 111 mmol/L   CO2 24 22 - 32 mmol/L   Glucose, Bld 94 65 - 99 mg/dL   BUN 15 6 - 20 mg/dL   Creatinine, Ser 0.78 0.44 - 1.00 mg/dL   Calcium 8.6 (L) 8.9 - 10.3 mg/dL   Total Protein 6.8 6.5 - 8.1 g/dL   Albumin 3.5 3.5 - 5.0 g/dL   AST 16 15 - 41 U/L   ALT 19 14 - 54 U/L   Alkaline Phosphatase 97 38 - 126 U/L   Total Bilirubin 0.5 0.3 - 1.2 mg/dL   GFR calc non Af Amer >60 >60 mL/min   GFR calc Af Amer >60 >60 mL/min    Comment: (NOTE) The eGFR has been calculated using the CKD EPI equation. This calculation has not been validated in all clinical situations. eGFR's persistently <60 mL/min signify possible Chronic Kidney Disease.    Anion gap 8 5 - 15  CBC     Status: Abnormal   Collection Time: 12/22/15  3:45 PM  Result Value Ref Range   WBC 11.0 (H) 4.0 - 10.5 K/uL   RBC 4.37 3.87 - 5.11 MIL/uL   Hemoglobin 11.9 (L) 12.0 - 15.0 g/dL   HCT 37.5 36.0 - 46.0 %   MCV 85.8 78.0 - 100.0 fL   MCH 27.2 26.0 - 34.0 pg   MCHC 31.7 30.0 - 36.0 g/dL   RDW 16.9 (H) 11.5 - 15.5 %   Platelets 356 150 - 400 K/uL  I-Stat CG4  Lactic Acid, ED     Status: None   Collection Time: 12/22/15  3:53 PM  Result Value Ref Range   Lactic Acid, Venous 1.25 0.5 - 2.0 mmol/L  Salicylate level     Status: None   Collection Time: 12/22/15  3:59 PM  Result Value Ref Range   Salicylate Lvl <1.2 2.8 - 30.0 mg/dL  Acetaminophen level     Status: Abnormal   Collection Time: 12/22/15  3:59 PM  Result Value Ref Range   Acetaminophen (Tylenol), Serum <10 (L) 10 - 30 ug/mL    Comment:        THERAPEUTIC CONCENTRATIONS VARY SIGNIFICANTLY. A RANGE OF 10-30 ug/mL MAY BE AN EFFECTIVE CONCENTRATION FOR MANY PATIENTS. HOWEVER, SOME ARE BEST TREATED AT CONCENTRATIONS OUTSIDE THIS RANGE. ACETAMINOPHEN CONCENTRATIONS >150 ug/mL AT 4 HOURS AFTER INGESTION AND >50 ug/mL AT 12 HOURS AFTER INGESTION ARE OFTEN ASSOCIATED WITH TOXIC REACTIONS.     Radiology Reports: Dg Chest 2 View  12/22/2015  CLINICAL DATA:  Productive cough for 1 week. EXAM: CHEST  2 VIEW COMPARISON:  November 27, 2015. FINDINGS: The heart size and mediastinal contours are within normal limits. Both lungs are clear. No pneumothorax or pleural effusion is noted.  The visualized skeletal structures are unremarkable. IMPRESSION: No active cardiopulmonary disease. Electronically Signed   By: Marijo Conception, M.D.   On: 12/22/2015 15:17   Ct Head Wo Contrast  12/22/2015  CLINICAL DATA:  Altered mental status. EXAM: CT HEAD WITHOUT CONTRAST TECHNIQUE: Contiguous axial images were obtained from the base of the skull through the vertex without intravenous contrast. COMPARISON:  None available currently. FINDINGS: Bony calvarium is unremarkable. Minimal diffuse cortical atrophy is noted. No mass effect or midline shift is noted. Ventricular size is within normal limits. There is no evidence of mass lesion, hemorrhage or acute infarction. IMPRESSION: Minimal diffuse cortical atrophy. No acute intracranial abnormality seen. Electronically Signed   By: Marijo Conception, M.D.   On:  12/22/2015 15:16    My interpretation of Electrocardiogram: Sinus tachycardia at 100 bpm. Normal axis. Intervals appear to be normal. QT interval specifically is normal. No concerning ST or T-wave changes.  Problem List  Principal Problem:   Acute encephalopathy Active Problems:   Gastroparesis   Fibromyalgia   Restless leg syndrome   OSA (obstructive sleep apnea)   Obesity, Class II, BMI 35-39.9, with comorbidity (HCC)   Serotonin syndrome   Assessment: This is a 60 year old Caucasian female with a past medical history as stated earlier, who also has obesity and sleep apnea, presents with worsening confusion over the last 24 hours. She is also noted to have twitching movements with some myoclonic activity. Etiology for her symptoms is not entirely clear. CT head did not show any acute findings. No infiltrates noted on chest x-ray. UA is pending. Urine drug screen is pending. Her symptoms are suggestive of medication side effect. ED provider was concerned about serotonin syndrome, considering her home medications, which is a possibility. She was also on Levaquin recently, which can also cause mental status changes.   Plan: #1 acute encephalopathy: As discussed above etiology is not clear. Neurology has been consulted and we await their input. We will proceed with checking a CK level, ammonia B-12, TSH. Check MRI brain. Check EEG. Hold all her psychotropic medications for now. She will need to be monitored closely, at least for tonight. She'll be kept nothing by mouth for now.  #2 Recent UTI with Proteus: Patient was treated with Levaquin and has completed course. Repeat UA is pending.  #3 Recently diagnosed sinusitis: Patient was given a prescription for Biaxin yesterday by her PCP. She has not started taking this medication yet. Hold off on starting this antibiotic for now.  #4 history of gastroparesis: Anti-emetics as needed.  #5 history of restless leg syndrome and fibromyalgia:  Monitor for now. Holding her medications due to acute encephalopathy.   DVT Prophylaxis: Lovenox Code Status: Full code Family Communication: Discussed with the patient's husband and her sister  Disposition Plan: Admit to stepdown for tonight   Further management decisions will depend on results of further testing and patient's response to treatment.   [facility]  [facility] Pager [phone]  If 7PM-7AM, please contact night-coverage [email or web address] Password [facility]  12/22/2015, 5:26 PM

## 2015-12-23 ENCOUNTER — Inpatient Hospital Stay (HOSPITAL_COMMUNITY)
Admit: 2015-12-23 | Discharge: 2015-12-23 | Disposition: A | Discharge status: Home / Self Care | Payer: 59 | Attending: Internal Medicine | Admitting: Internal Medicine

## 2015-12-23 DIAGNOSIS — R4182 Altered mental status, unspecified: Secondary | ICD-10-CM

## 2015-12-23 LAB — GASTROINTESTINAL PANEL BY PCR, STOOL (REPLACES STOOL CULTURE)

## 2015-12-23 LAB — COMPREHENSIVE METABOLIC PANEL
ALT: 17 U/L (ref 14–54)
AST: 18 U/L (ref 15–41)
Albumin: 3.3 g/dL — ABNORMAL LOW (ref 3.5–5.0)
Alkaline Phosphatase: 94 U/L (ref 38–126)
Anion gap: 12 (ref 5–15)
BUN: 15 mg/dL (ref 6–20)
CO2: 25 mmol/L (ref 22–32)
Calcium: 8.7 mg/dL — ABNORMAL LOW (ref 8.9–10.3)
Chloride: 112 mmol/L — ABNORMAL HIGH (ref 101–111)
Creatinine, Ser: 0.78 mg/dL (ref 0.44–1.00)
GFR calc Af Amer: >60 mL/min (ref >60)
GFR calc non Af Amer: >60 mL/min (ref >60)
Glucose, Bld: 101 mg/dL — ABNORMAL HIGH (ref 65–99)
Potassium: 3.5 mmol/L (ref 3.5–5.1)
Sodium: 149 mmol/L — ABNORMAL HIGH (ref 135–145)
Total Bilirubin: 0.4 mg/dL (ref 0.3–1.2)
Total Protein: 6.5 g/dL (ref 6.5–8.1)

## 2015-12-23 LAB — CBC
HCT: 37 % (ref 36.0–46.0)
Hemoglobin: 11.3 g/dL — ABNORMAL LOW (ref 12.0–15.0)
MCH: 26.9 pg (ref 26.0–34.0)
MCHC: 30.5 g/dL (ref 30.0–36.0)
MCV: 88.1 fL (ref 78.0–100.0)
Platelets: 363 10*3/uL (ref 150–400)
RBC: 4.2 MIL/uL (ref 3.87–5.11)
RDW: 17.2 % — ABNORMAL HIGH (ref 11.5–15.5)
WBC: 8.8 10*3/uL (ref 4.0–10.5)

## 2015-12-23 LAB — URINE CULTURE: Culture: NO GROWTH

## 2015-12-23 NOTE — Evaluation (Addendum)
Clinical/Bedside Swallow Evaluation Patient Details  Name: [name] MRN: [id number] Date of Birth: Aug 05, 1956  Today's Date: 12/23/2015 Time: SLP Start Time (ACUTE ONLY): 1140 SLP Stop Time (ACUTE ONLY): 1159 SLP Time Calculation (min) (ACUTE ONLY): 19 min  Past Medical History:  Past Medical History  Diagnosis Date  . Fibromyalgia   . GERD (gastroesophageal reflux disease)   . Obstructive sleep apnea     On Cpap 2009  . Anxiety   . Depression   . Restless leg syndrome   . Edema   . Migraine   . Hyperlipidemia   . Anemia     previously followed by Dr. Jamse Arn for anemia and elevated platelets  . Paresthesia     Dr. Everette Rank at [facility]  . Tremor     Dr. Everette Rank  . Gastroparesis     follow[facility] at [facility]  . Pneumonia     2012  . Plantar fasciitis 02/2011    R foot  . S/P endometrial ablation 08/09/2006    Novasure Ablation  . C. difficile colitis 10/01/12    treated by [facility]  . Dyssynergia     dyssynergenic defecation, contributing to fecal incontinence.  . Kidney stone   . Pelvic floor dysfunction     pelvic floor dyssynergy  . Neuropathy ([place])   . DDD (degenerative disc disease), lumbar 08/19/14    and facet arthroplasty & left lumbar radiculopathy (Dr.Ramos)  . S/P epidural steroid injection 09/20/14    Dr.Ramos  . PONV (postoperative nausea and vomiting)     pt states has gastroparesis has difficulty taking antibiotics and narcotics has severe nausea and vomiting   . Shortness of breath dyspnea     bending over; exertion   . Urinary frequency   . Urinary incontinence   . Chronic fatigue syndrome   . Lumbar radiculopathy    Past Surgical History:  Past Surgical History  Procedure Laterality Date  . Tonsillectomy  1968  . Knee surgery  1999    R knee, Dr. Eddie Dibbles, torn cartilage  . Cholecystectomy  9/05  . Endometrial ablation  08/09/2006    Dr. Valentina Shaggy Ablation  . Tonsillectomy    . Total knee arthroplasty Right 09/05/2015    Procedure:  RIGHT TOTAL KNEE ARTHROPLASTY;  Surgeon: Gaynelle Arabian, MD;  Location: [facility];  Service: Orthopedics;  Laterality: Right;   HPI:  60 yo female adm to [facility] with AMS, found on floor by family.  PMH + for FM, GERD, OSA, pna, gastroparesis, migraines, edema, RLS, tremor.  CXR negative.  MRI negative for acute change.     Assessment / Plan / Recommendation Clinical Impression  Pt presents with functional oropharyngeal swallow ability - slow but effective mastication.  Generalized weakness noted with throat clear/cough noted before and during po intake = do not anticipate coorelated to dysphagia.  Pt able to self feed with effort due to tremor.  Sister present reports pt has recently had a cough = viral issue over the last few weeks.    Pt reports swallow ability to be as good as normal.   Reviewed indication for several small meals given pt gastroparesis.  Recommend regular/thin diet - educated pt and sister to aspiration precautions and s/s of difficulties with swallowing.  Will follow up x1 to assure tolerance next week.    If pt with poor tolerance over the weekend, please page weekend SLP at [phone] on Sat/Sun.   Given pt with clear expressive language deficits, MD may wish to  order SLE if symptoms do not resolve.  Thanks.     Aspiration Risk  Mild aspiration risk    Diet Recommendation Regular;Thin liquid   Liquid Administration via: Cup;Straw Medication Administration: Whole meds with liquid Supervision: Patient able to self feed Compensations: Minimize environmental distractions;Slow rate;Small sips/bites    Other  Recommendations Oral Care Recommendations: Oral care BID Other Recommendations: Clarify dietary restrictions   Follow up Recommendations  None    Frequency and Duration min 1 x/week          Prognosis Prognosis for Safe Diet Advancement: Good      Swallow Study   General Date of Onset: 12/23/15 HPI: 60 yo female adm to [facility] with AMS, found on floor by family.  PMH +  for FM, GERD, OSA, pna, gastroparesis, migraines, edema, RLS, tremor.  CXR negative.  MRI negative for acute change.   Type of Study: Bedside Swallow Evaluation Diet Prior to this Study: NPO Temperature Spikes Noted: Yes (low grade) Respiratory Status: Room air History of Recent Intubation: No Behavior/Cognition: Cooperative;Alert Oral Cavity Assessment: Within Functional Limits Oral Care Completed by SLP: No Oral Cavity - Dentition: Adequate natural dentition Vision: Functional for self-feeding Self-Feeding Abilities: Able to feed self (slight tremor, which pt states is worse than baseline) Patient Positioning: Upright in bed Baseline Vocal Quality: Normal Volitional Cough: Strong Volitional Swallow: Able to elicit    Oral/Motor/Sensory Function Overall Oral Motor/Sensory Function: Generalized oral weakness (generalized weakness, ? some motor planning deficits)   Ice Chips Ice chips: Not tested   Thin Liquid Thin Liquid: Within functional limits Presentation: Self Fed;Cup;Straw    Nectar Thick Nectar Thick Liquid: Not tested   Honey Thick Honey Thick Liquid: Not tested   Puree Puree: Within functional limits Presentation: Self Fed;Spoon   Solid   GO   Solid: Within functional limits Presentation: [place], [place] [facility] SLP [phone]

## 2015-12-23 NOTE — Care Management Note (Signed)
Case Management Note  Patient Details  Name: [name] MRN: [id number] Date of Birth: 11/08/1956  Subjective/Objective:      Sepsis with ams hx of outpt treatment for uri and uti x1week              Action/Plan:Date: December 23, 2015 Chart reviewed for concurrent status and case management needs. Will continue to follow patient for changes and needs: [name], BSN, RN, [place]   [phone]   Expected Discharge Date:   (Unknown)               Expected Discharge Plan:  Home/Self Care  In-House Referral:  NA  Discharge planning Services  CM Consult  Post Acute Care Choice:  NA Choice offered to:  NA  DME Arranged:    DME Agency:     HH Arranged:    [place] Agency:     Status of Service:  Completed, signed off  Medicare Important Message Given:    Date Medicare IM Given:    Medicare IM give by:    Date Additional Medicare IM Given:    Additional Medicare Important Message give by:     If discussed at [place] of Stay Meetings, dates discussed:    Additional Comments:  Leah Cha, RN 12/23/2015, 10:24 AM

## 2015-12-23 NOTE — Evaluation (Signed)
SLP Cancellation Note  Patient Details Name: [name] MRN: [id number] DOB: November 27, 1955   Cancelled treatment:       Reason Eval/Treat Not Completed: Patient at procedure or test/unavailable (EEG just starting, SLP will return for swallow evaluation at later time- requested RN to page me when EEG done.    [name], [place] [facility] SLP [phone]

## 2015-12-23 NOTE — Progress Notes (Signed)
OT Cancellation Note  Patient Details Name: [name] MRN: [id number] DOB: January 16, 1956   Cancelled Treatment:    Reason Eval/Treat Not Completed: Fatigue/lethargy limiting ability to participate.  Spoke to [employer]. Pt has had a very busy day.  If schedule permits, we will check back this weekend; if not, we will return on Monday  Quadry Kampa 12/23/2015, 2:22 PM  Lesle Chris, OTR/L [id number] 12/23/2015

## 2015-12-23 NOTE — Progress Notes (Signed)
Offsite EEG completed, results pending. 

## 2015-12-23 NOTE — Progress Notes (Signed)
Patient unable to tolerate CPAP. RT placed 2 liters oxygen on pt.

## 2015-12-23 NOTE — Evaluation (Signed)
Physical Therapy Evaluation Patient Details Name: ADWOA PRESTWOOD MRN: [id number] DOB: 1956-07-01 Today's Date: 12/23/2015   History of Present Illness  CALISHA SODARO is a 60 y.o. female with a past medical history of fibromyalgia, chronic fatigue syndrome, gastroparesis, restless leg syndrome, R TKA  who was diagnosed with a urinary tract infection about 10 days ago and was treated with Levaquin. Patient was seen by her primary care physician yesterday with complaints of low-grade fever, cough and was diagnosed with sinusitis.  Patient was brought in 12/22/15  by her husband due to worsening confusion, s/p fall at home  Clinical Impression  Pt admitted with above diagnosis. Pt currently with functional limitations due to the deficits listed below (see PT Problem List).  Pt will benefit from skilled PT to increase their independence and safety with mobility to allow discharge to the venue listed below.   Will continue to follow, pt may need SNF depending on  If her cognition clears    Follow Up Recommendations  (depending on cognition)    Equipment Recommendations  None recommended by PT    Recommendations for Other Services       Precautions / Restrictions Precautions Precautions: Fall Restrictions Weight Bearing Restrictions: No      Mobility  Bed Mobility Overal bed mobility: Needs Assistance Bed Mobility: Supine to Sit;Sit to Supine     Supine to sit: Min assist Sit to supine: +2 for physical assistance;Mod assist   General bed mobility comments: multi-modal cues for sequencing of task; incr assist required for return to supine; NT assisted PT in getting pt back  to bed  Transfers Overall transfer level: Needs assistance Equipment used: Rolling walker (2 wheeled) Transfers: Sit to/from Stand Sit to Stand: Max assist;Mod assist         General transfer comment: attempted stand to all  better positioning in bed when back to supine; pt extremely shaky and anxious when  standing; PT had her sit down d/t no +2 available and pt also with elevated BP  Ambulation/Gait                Stairs            Wheelchair Mobility    Modified Rankin (Stroke Patients Only)       Balance Overall balance assessment: Needs assistance;History of Falls Sitting-balance support: No upper extremity supported;Feet unsupported Sitting balance-Leahy Scale: Fair       Standing balance-Leahy Scale: Zero                               Pertinent Vitals/Pain Pain Assessment: No/denies pain    Home Living Family/patient expects to be discharged to:: Private residence Living Arrangements: Spouse/significant other Available Help at Discharge: Family Type of Home: House Home Access: Stairs to enter Entrance Stairs-Rails: None Entrance Stairs-Number of Steps: 1/2 + 1 Home Layout: One level Home Equipment: Bedside commode;Walker - 2 wheels      Prior Function Level of Independence: Independent               Hand Dominance        Extremity/Trunk Assessment   Upper Extremity Assessment: Overall WFL for tasks assessed;Defer to OT evaluation           Lower Extremity Assessment: RLE deficits/detail;LLE deficits/detail RLE Deficits / Details: tremulous when  movement initiated LLE Deficits / Details: same as above     Communication   Communication: No  difficulties  Cognition Arousal/Alertness: Awake/alert Behavior During Therapy: WFL for tasks assessed/performed Overall Cognitive Status: Impaired/Different from baseline Area of Impairment: Orientation;Following commands;Safety/judgement;Problem solving;Attention Orientation Level: Disoriented to;Time;Situation Current Attention Level: Sustained Memory: Decreased short-term memory Following Commands: Follows one step commands with increased time     Problem Solving: Slow processing;Decreased initiation;Difficulty sequencing;Requires verbal cues;Requires tactile cues       General Comments      Exercises General Exercises - Lower Extremity Ankle Circles/Pumps: AROM;Both;10 reps Long Arc Quad: AROM;Both;10 reps      Assessment/Plan    PT Assessment Patient needs continued PT services  PT Diagnosis Difficulty walking   PT Problem List Decreased strength;Decreased activity tolerance;Decreased balance;Decreased mobility;Decreased coordination;Decreased safety awareness  PT Treatment Interventions DME instruction;Gait training;Functional mobility training;Therapeutic activities;Patient/family education;Therapeutic exercise;Balance training   PT Goals (Current goals can be found in the Care Plan section) Acute Rehab PT Goals PT Goal Formulation: Patient unable to participate in goal setting Time For Goal Achievement: 01/06/16 Potential to Achieve Goals: Good    Frequency Min 3X/week   Barriers to discharge        Co-evaluation               End of Session   Activity Tolerance: Patient limited by fatigue;Treatment limited secondary to medical complications (Comment) Patient left: in bed;with call bell/phone within reach;with nursing/sitter in room Nurse Communication: Mobility status         Time: 1243-1311 PT Time Calculation (min) (ACUTE ONLY): 28 min   Charges:   PT Evaluation $PT Eval Moderate Complexity: 1 Procedure PT Treatments $Therapeutic Activity: 8-22 mins   PT G Codes:        Erika Hussar 21-Jan-2016, 1:35 PM

## 2015-12-23 NOTE — Progress Notes (Signed)
[facility] PROGRESS NOTE  [name] [id number] DOB: 17-Apr-1956 DOA: 12/22/2015  PCP: Vikki Ports, MD  Brief HPI: 60 year old Caucasian female with a past medical history of fibromyalgia, chronic fatigue syndrome, gastroparesis, restless leg syndrome, who presented to the emergency department with the worsening confusion. There was concern for serotonin syndrome. Patient was hospitalized for further management.  Past medical history:  Past Medical History  Diagnosis Date  . Fibromyalgia   . GERD (gastroesophageal reflux disease)   . Obstructive sleep apnea     On Cpap 2009  . Anxiety   . Depression   . Restless leg syndrome   . Edema   . Migraine   . Hyperlipidemia   . Anemia     previously followed by Dr. Jamse Arn for anemia and elevated platelets  . Paresthesia     Dr. Everette Rank at [facility]  . Tremor     Dr. Everette Rank  . Gastroparesis     followed at [facility]  . Pneumonia     2012  . Plantar fasciitis 02/2011    R foot  . S/P endometrial ablation 08/09/2006    Novasure Ablation  . C. difficile colitis 10/01/12    treated by [facility]  . Dyssynergia     dyssynergenic defecation, contributing to fecal incontinence.  . Kidney stone   . Pelvic floor dysfunction     pelvic floor dyssynergy  . Neuropathy ([place])   . DDD (degenerative disc disease), lumbar 08/19/14    and facet arthroplasty & left lumbar radiculopathy (Dr.Ramos)  . S/P epidural steroid injection 09/20/14    Dr.Ramos  . PONV (postoperative nausea and vomiting)     pt states has gastroparesis has difficulty taking antibiotics and narcotics has severe nausea and vomiting   . Shortness of breath dyspnea     bending over; exertion   . Urinary frequency   . Urinary incontinence   . Chronic fatigue syndrome   . Lumbar radiculopathy     Consultants: Neurology  Procedures:  EEG is pending  Antibiotics: None  Subjective: Patient remains confused, although not as much as yesterday. Still has  difficulty answering questions. Does not appear to be in any discomfort. Her husband and son are at the bedside  Objective: Vital Signs  Filed Vitals:   12/23/15 0000 12/23/15 0400 12/23/15 0600 12/23/15 1056  BP: 135/88 129/75 112/94 172/139  Pulse: 94 93 93 95  Temp: 98.2 F (36.8 C) 98.1 F (36.7 C)    TempSrc: Oral Oral    Resp: 16 22 21    Height:      Weight:      SpO2: 98% 98% 99%     Intake/Output Summary (Last 24 hours) at 12/23/15 1127 Last data filed at 12/23/15 0500  Gross per 24 hour  Intake    900 ml  Output    150 ml  Net    750 ml   Filed Weights   12/22/15 2200  Weight: 95.7 kg (210 lb 15.7 oz)    General appearance: alert, cooperative, distracted and no distress Resp: clear to auscultation bilaterally Cardio: regular rate and rhythm, S1, S2 normal, no murmur, click, rub or gallop GI: soft, non-tender; bowel sounds normal; no masses,  no organomegaly Extremities: extremities normal, atraumatic, no cyanosis or edema Neurologic: She is awake and alert. Was able to tell me that she is in a hospital. Was able to identify her husband and son. Could not tell me the year or month. No facial asymmetry.  Tongue is midline. Motor strength equal bilateral upper and lower extremities.  Lab Results:  Basic Metabolic Panel:  Recent Labs Lab 12/22/15 1545 12/23/15 0322  NA 142 149*  K 3.7 3.5  CL 110 112*  CO2 24 25  GLUCOSE 94 101*  BUN 15 15  CREATININE 0.78 0.78  CALCIUM 8.6* 8.7*   Liver Function Tests:  Recent Labs Lab 12/22/15 1545 12/23/15 0322  AST 16 18  ALT 19 17  ALKPHOS 97 94  BILITOT 0.5 0.4  PROT 6.8 6.5  ALBUMIN 3.5 3.3*    Recent Labs Lab 12/22/15 1803  AMMONIA 18   CBC:  Recent Labs Lab 12/22/15 1545 12/23/15 0322  WBC 11.0* 8.8  HGB 11.9* 11.3*  HCT 37.5 37.0  MCV 85.8 88.1  PLT 356 363   Cardiac Enzymes:  Recent Labs Lab 12/22/15 1802  CKTOTAL 49   BNP (last 3 results)  Recent Labs  11/27/15 1948  BNP  45.3    CBG:  Recent Labs Lab 12/22/15 1528  GLUCAP 83    Recent Results (from the past 240 hour(s))  Urine culture     Status: None (Preliminary result)   Collection Time: 12/22/15  6:11 PM  Result Value Ref Range Status   Specimen Description URINE, RANDOM  Final   Special Requests NONE  Final   Culture   Final    NO GROWTH < 12 HOURS Performed at [facility]    Report Status PENDING  Incomplete  MRSA PCR Screening     Status: None   Collection Time: 12/22/15  7:53 PM  Result Value Ref Range Status   MRSA by PCR NEGATIVE NEGATIVE Final    Comment:        The GeneXpert MRSA Assay (FDA approved for NASAL specimens only), is one component of a comprehensive MRSA colonization surveillance program. It is not intended to diagnose MRSA infection nor to guide or monitor treatment for MRSA infections.   C difficile quick scan w PCR reflex     Status: None   Collection Time: 12/22/15 10:08 PM  Result Value Ref Range Status   C Diff antigen NEGATIVE NEGATIVE Final   C Diff toxin NEGATIVE NEGATIVE Final   C Diff interpretation Negative for toxigenic C. difficile  Final      Studies/Results: Dg Chest 2 View  12/22/2015  CLINICAL DATA:  Productive cough for 1 week. EXAM: CHEST  2 VIEW COMPARISON:  November 27, 2015. FINDINGS: The heart size and mediastinal contours are within normal limits. Both lungs are clear. No pneumothorax or pleural effusion is noted. The visualized skeletal structures are unremarkable. IMPRESSION: No active cardiopulmonary disease. Electronically Signed   By: Marijo Conception, M.D.   On: 12/22/2015 15:17   Ct Head Wo Contrast  12/22/2015  CLINICAL DATA:  Altered mental status. EXAM: CT HEAD WITHOUT CONTRAST TECHNIQUE: Contiguous axial images were obtained from the base of the skull through the vertex without intravenous contrast. COMPARISON:  None available currently. FINDINGS: Bony calvarium is unremarkable. Minimal diffuse cortical atrophy is  noted. No mass effect or midline shift is noted. Ventricular size is within normal limits. There is no evidence of mass lesion, hemorrhage or acute infarction. IMPRESSION: Minimal diffuse cortical atrophy. No acute intracranial abnormality seen. Electronically Signed   By: Marijo Conception, M.D.   On: 12/22/2015 15:16   Mr Brain Wo Contrast  12/22/2015  CLINICAL DATA:  Initial evaluation for acute encephalopathy. EXAM: MRI HEAD WITHOUT CONTRAST TECHNIQUE: Multiplanar,  multiecho pulse sequences of the brain and surrounding structures were obtained without intravenous contrast. COMPARISON:  Prior CT from earlier the same day. FINDINGS: Study is severely limited as only a sagittal T1 weighted sequence was obtained. Patient was unable to tolerate full length of exam. Limited sagittal view of the brain demonstrates no definite acute abnormality. Midline structures intact. Pituitary gland normal. No definite mass lesion or mass effect. Study inadequate to evaluate for infarct or other acute abnormality. Mild cerebellar tonsillar ectopia of 5 mm without frank Chiari malformation. Visualized upper cervical spine normal. Bilateral sphenoid sinus disease noted. Orbits grossly normal. Bone marrow signal intensity within normal limits. No scalp soft tissue abnormality. IMPRESSION: 1. Markedly limited study with only sagittal T1 weighted sequence performed. Patient was unable to tolerate full length of exam. 2. No definite acute abnormality. 3. Cerebellar tonsillar ectopia of 5 mm without frank Chiari malformation. 4. Bilateral sphenoid sinus disease. Electronically Signed   By: Jeannine Boga M.D.   On: 12/22/2015 21:30    Medications:  Scheduled: . antiseptic oral rinse  7 mL Mouth Rinse q12n4p  . chlorhexidine  15 mL Mouth Rinse BID  . enoxaparin (LOVENOX) injection  50 mg Subcutaneous Q24H  . metoprolol  50 mg Oral BID  . sodium chloride flush  3 mL Intravenous Q12H  . thiamine  100 mg Intravenous Daily    Continuous: . sodium chloride 75 mL/hr at 12/23/15 0737   [id number] **OR** acetaminophen, albuterol, ondansetron **OR** ondansetron (ZOFRAN) IV  Assessment/Plan:  Principal Problem:   Acute encephalopathy Active Problems:   Gastroparesis   Fibromyalgia   Restless leg syndrome   OSA (obstructive sleep apnea)   Obesity, Class II, BMI 35-39.9, with comorbidity (HCC)   Serotonin syndrome    Acute encephalopathy Etiology remains unclear. She is on multiple psychotropic medications at home. There was a concern regarding serotonin syndrome. CK level is normal. Patient has been seen by neurology. MRI brain was a limited study due to motion. No obvious stroke was identified. Ammonia level was normal. B-12 level is 752. TSH is 1.79. Urine drug screen was positive for benzodiazepines only. No obvious infectious source has been identified. C. difficile was negative. EEG is pending. Patient's mental status appears to be slightly better compared to yesterday. Await swallow evaluation. Continue to monitor for now. He cannot evaluation.  Recent UTI with Proteus Patient was treated with Levaquin and has completed course. Repeat UA does not suggest infection.  Recently diagnosed sinusitis Patient was given a prescription for Biaxin on 1/25 by her PCP. She has not started taking this medication yet. Hold off on starting antibiotics for now.  History of gastroparesis Anti-emetics as needed.  History of restless leg syndrome and fibromyalgia Monitor for now. Holding her medications due to acute encephalopathy.  History of obstructive sleep apnea CPAP  DVT Prophylaxis:  Lovenox    Code Status:  Full code  Family Communication:  Discussed with the patient's husband and son  Disposition Plan:  Await improvement. Await results of EEG and further neurology input. However, patient is stable and could be transferred to telemetry.   LOS: 1 day   [facility] Pager  [phone] 12/23/2015, 11:27 AM  If 7PM-7AM, please contact night-coverage at [email or web address], password [facility]

## 2015-12-23 NOTE — Procedures (Signed)
ELECTROENCEPHALOGRAM REPORT  Date of Study: 12/23/2015  Patient's Name: [name] MRN: [id number] Date of Birth: 06/20/1956  Referring Provider: Dr. Roland Rack  Clinical History: This is a 60 year old woman with tremors and confusion.  Medications: acetaminophen (TYLENOL) tablet 650 mg albuterol (PROVENTIL) (2.5 MG/3ML) 0.083% nebulizer solution 2.5 mg metoprolol tartrate (LOPRESSOR) tablet 50 mg thiamine (B-1) injection 100 mg  Technical Summary: A multichannel digital EEG recording measured by the international 10-20 system with electrodes applied with paste and impedances below 5000 ohms performed as portable with EKG monitoring in an awake and confused patient.  Hyperventilation and photic stimulation were not performed.  The digital EEG was referentially recorded, reformatted, and digitally filtered in a variety of bipolar and referential montages for optimal display.   Description: The patient is awake and confused during the recording.  During maximal wakefulness, there is a symmetric, medium to high voltage 7 Hz posterior dominant rhythm that attenuates with eye opening. This is admixed with a large amount of diffuse medium to high voltage 5-7 Hz theta and occasional diffuse 2-3 Hz delta slowing of the waking background, at times sharply contoured without clear epileptogenic potential. Normal sleep architecture is not seen. Hyperventilation and photic stimulation were not performed.  There were no clear epileptiform discharges or electrographic seizures seen.    EKG lead was unremarkable.  Impression: This awake EEG is abnormal due to mild to moderate diffuse slowing of the waking background.  Clinical Correlation of the above findings indicates diffuse cerebral dysfunction that is non-specific in etiology and can be seen with hypoxic/ischemic injury, toxic/metabolic encephalopathies, or medication effect.  The absence of epileptiform discharges does not rule out a  clinical diagnosis of epilepsy.  Clinical correlation is advised.   Ellouise Newer, M.D.

## 2015-12-24 ENCOUNTER — Inpatient Hospital Stay (HOSPITAL_COMMUNITY): Payer: 59

## 2015-12-24 DIAGNOSIS — R062 Wheezing: Secondary | ICD-10-CM

## 2015-12-24 LAB — CBC
HCT: 36.3 % (ref 36.0–46.0)
Hemoglobin: 11.6 g/dL — ABNORMAL LOW (ref 12.0–15.0)
MCH: 28.1 pg (ref 26.0–34.0)
MCHC: 32 g/dL (ref 30.0–36.0)
MCV: 87.9 fL (ref 78.0–100.0)
Platelets: 356 10*3/uL (ref 150–400)
RBC: 4.13 MIL/uL (ref 3.87–5.11)
RDW: 17 % — ABNORMAL HIGH (ref 11.5–15.5)
WBC: 9.5 10*3/uL (ref 4.0–10.5)

## 2015-12-24 LAB — CK: Total CK: 56 U/L (ref 38–234)

## 2015-12-24 LAB — BASIC METABOLIC PANEL
Anion gap: 8 (ref 5–15)
BUN: 10 mg/dL (ref 6–20)
CO2: 26 mmol/L (ref 22–32)
Calcium: 8.4 mg/dL — ABNORMAL LOW (ref 8.9–10.3)
Chloride: 110 mmol/L (ref 101–111)
Creatinine, Ser: 0.66 mg/dL (ref 0.44–1.00)
GFR calc Af Amer: >60 mL/min (ref >60)
GFR calc non Af Amer: >60 mL/min (ref >60)
Glucose, Bld: 102 mg/dL — ABNORMAL HIGH (ref 65–99)
Potassium: 3.4 mmol/L — ABNORMAL LOW (ref 3.5–5.1)
Sodium: 144 mmol/L (ref 135–145)

## 2015-12-24 MED ORDER — BENZONATATE 100 MG PO CAPS
100.0000 mg | ORAL_CAPSULE | Freq: Three times a day (TID) | ORAL | Status: DC
  Administered 2015-12-24 – 2015-12-26 (×7): 100 mg via ORAL
  Filled 2015-12-24 (×7): qty 1

## 2015-12-24 MED ORDER — IPRATROPIUM-ALBUTEROL 0.5-2.5 (3) MG/3ML IN SOLN
3.0000 mL | Freq: Two times a day (BID) | RESPIRATORY_TRACT | Status: DC
  Administered 2015-12-25 – 2015-12-26 (×2): 3 mL via RESPIRATORY_TRACT
  Filled 2015-12-24 (×3): qty 3

## 2015-12-24 MED ORDER — LOPERAMIDE HCL 2 MG PO CAPS: 2.0000 mg | ORAL_CAPSULE | Freq: Three times a day (TID) | ORAL | Status: DC | PRN

## 2015-12-24 MED ORDER — IPRATROPIUM-ALBUTEROL 0.5-2.5 (3) MG/3ML IN SOLN
3.0000 mL | Freq: Three times a day (TID) | RESPIRATORY_TRACT | Status: DC
  Administered 2015-12-24 (×2): 3 mL via RESPIRATORY_TRACT
  Filled 2015-12-24 (×2): qty 3

## 2015-12-24 MED ORDER — IPRATROPIUM-ALBUTEROL 0.5-2.5 (3) MG/3ML IN SOLN: 3.0000 mL | Freq: Three times a day (TID) | RESPIRATORY_TRACT | Status: DC

## 2015-12-24 MED ORDER — POTASSIUM CHLORIDE CRYS ER 20 MEQ PO TBCR
40.0000 meq | EXTENDED_RELEASE_TABLET | Freq: Once | ORAL | Status: AC
  Administered 2015-12-24: 40 meq via ORAL
  Filled 2015-12-24: qty 2

## 2015-12-24 NOTE — Progress Notes (Signed)
[facility] PROGRESS NOTE  [name] [id number] DOB: 1956/06/21 DOA: 12/22/2015  PCP: Vikki Ports, MD  Brief HPI: 60 year old Caucasian female with a past medical history of fibromyalgia, chronic fatigue syndrome, gastroparesis, restless leg syndrome, who presented to the emergency department with the worsening confusion. There was concern for serotonin syndrome. Patient was hospitalized for further management.  Past medical history:  Past Medical History  Diagnosis Date  . Fibromyalgia   . GERD (gastroesophageal reflux disease)   . Obstructive sleep apnea     On Cpap 2009  . Anxiety   . Depression   . Restless leg syndrome   . Edema   . Migraine   . Hyperlipidemia   . Anemia     previously followed by Dr. Jamse Arn for anemia and elevated platelets  . Paresthesia     Dr. Everette Rank at [facility]  . Tremor     Dr. Everette Rank  . Gastroparesis     followed at [facility]  . Pneumonia     2012  . Plantar fasciitis 02/2011    R foot  . S/P endometrial ablation 08/09/2006    Novasure Ablation  . C. difficile colitis 10/01/12    treated by [facility]  . Dyssynergia     dyssynergenic defecation, contributing to fecal incontinence.  . Kidney stone   . Pelvic floor dysfunction     pelvic floor dyssynergy  . Neuropathy ([place])   . DDD (degenerative disc disease), lumbar 08/19/14    and facet arthroplasty & left lumbar radiculopathy (Dr.Ramos)  . S/P epidural steroid injection 09/20/14    Dr.Ramos  . PONV (postoperative nausea and vomiting)     pt states has gastroparesis has difficulty taking antibiotics and narcotics has severe nausea and vomiting   . Shortness of breath dyspnea     bending over; exertion   . Urinary frequency   . Urinary incontinence   . Chronic fatigue syndrome   . Lumbar radiculopathy     Consultants: Neurology  Procedures:  EEG "Impression: This awake EEG is abnormal due to mild to moderate diffuse slowing of the waking  background."   Antibiotics: None  Subjective: Patient feels better. Less confused. Husband states that she is looking a whole lot better. Denies any pain. Has noticed more cough and wheezing compared to yesterday. No nausea, vomiting. Poor appetite.   Objective: Vital Signs  Filed Vitals:   12/24/15 0000 12/24/15 0200 12/24/15 0400 12/24/15 0634  BP: 140/89 147/93 136/74 101/69  Pulse: 70 87 74 62  Temp: 98 F (36.7 C)  98.7 F (37.1 C)   TempSrc: Oral     Resp: 13 25 23 19   Height:      Weight:      SpO2: 99% 100% 98% 98%    Intake/Output Summary (Last 24 hours) at 12/24/15 0733 Last data filed at 12/24/15 0600  Gross per 24 hour  Intake 1830.42 ml  Output    150 ml  Net 1680.42 ml   Filed Weights   12/22/15 2200  Weight: 95.7 kg (210 lb 15.7 oz)    General appearance: alert, cooperative, distracted and no distress Resp: Scattered wheezes heard bilaterally. No crackles. No rhonchi.  Cardio: regular rate and rhythm, S1, S2 normal, no murmur, click, rub or gallop GI: soft, non-tender; bowel sounds normal; no masses,  no organomegaly Neurologic: She is awake and alert. Today, she is oriented to person, place, year, month, date. No facial asymmetry. Tongue is midline. Motor strength equal bilateral  upper and lower extremities.  Lab Results:  Basic Metabolic Panel:  Recent Labs Lab 12/22/15 1545 12/23/15 0322 12/24/15 0349  NA 142 149* 144  K 3.7 3.5 3.4*  CL 110 112* 110  CO2 24 25 26   GLUCOSE 94 101* 102*  BUN 15 15 10   CREATININE 0.78 0.78 0.66  CALCIUM 8.6* 8.7* 8.4*   Liver Function Tests:  Recent Labs Lab 12/22/15 1545 12/23/15 0322  AST 16 18  ALT 19 17  ALKPHOS 97 94  BILITOT 0.5 0.4  PROT 6.8 6.5  ALBUMIN 3.5 3.3*    Recent Labs Lab 12/22/15 1803  AMMONIA 18   CBC:  Recent Labs Lab 12/22/15 1545 12/23/15 0322 12/24/15 0349  WBC 11.0* 8.8 9.5  HGB 11.9* 11.3* 11.6*  HCT 37.5 37.0 36.3  MCV 85.8 88.1 87.9  PLT 356 363 356    Cardiac Enzymes:  Recent Labs Lab 12/22/15 1802 12/24/15 0349  CKTOTAL 49 56   BNP (last 3 results)  Recent Labs  11/27/15 1948  BNP 45.3    CBG:  Recent Labs Lab 12/22/15 1528  GLUCAP 83    Recent Results (from the past 240 hour(s))  Urine culture     Status: None   Collection Time: 12/22/15  6:11 PM  Result Value Ref Range Status   Specimen Description URINE, RANDOM  Final   Special Requests NONE  Final   Culture   Final    NO GROWTH 1 DAY Performed at [facility]    Report Status 12/23/2015 FINAL  Final  Gastrointestinal Panel by PCR , Stool     Status: None   Collection Time: 12/22/15  6:12 PM  Result Value Ref Range Status   Campylobacter species NOT DETECTED NOT DETECTED Final   Plesimonas shigelloides NOT DETECTED NOT DETECTED Final   Salmonella species NOT DETECTED NOT DETECTED Final   Yersinia enterocolitica NOT DETECTED NOT DETECTED Final   Vibrio species NOT DETECTED NOT DETECTED Final   Vibrio cholerae NOT DETECTED NOT DETECTED Final   Enteroaggregative E coli (EAEC) NOT DETECTED NOT DETECTED Final   Enteropathogenic E coli (EPEC) NOT DETECTED NOT DETECTED Final   Enterotoxigenic E coli (ETEC) NOT DETECTED NOT DETECTED Final   Shiga like toxin producing E coli (STEC) NOT DETECTED NOT DETECTED Final   E. coli O157 NOT DETECTED NOT DETECTED Final   Shigella/Enteroinvasive E coli (EIEC) NOT DETECTED NOT DETECTED Final   Cryptosporidium NOT DETECTED NOT DETECTED Final   Cyclospora cayetanensis NOT DETECTED NOT DETECTED Final   Entamoeba histolytica NOT DETECTED NOT DETECTED Final   Giardia lamblia NOT DETECTED NOT DETECTED Final   Adenovirus F40/41 NOT DETECTED NOT DETECTED Final   Astrovirus NOT DETECTED NOT DETECTED Final   Norovirus GI/GII NOT DETECTED NOT DETECTED Final   Rotavirus A NOT DETECTED NOT DETECTED Final   Sapovirus (I, II, IV, and V) NOT DETECTED NOT DETECTED Final  MRSA PCR Screening     Status: None   Collection Time:  12/22/15  7:53 PM  Result Value Ref Range Status   MRSA by PCR NEGATIVE NEGATIVE Final    Comment:        The GeneXpert MRSA Assay (FDA approved for NASAL specimens only), is one component of a comprehensive MRSA colonization surveillance program. It is not intended to diagnose MRSA infection nor to guide or monitor treatment for MRSA infections.   C difficile quick scan w PCR reflex     Status: None   Collection Time: 12/22/15  10:08 PM  Result Value Ref Range Status   C Diff antigen NEGATIVE NEGATIVE Final   C Diff toxin NEGATIVE NEGATIVE Final   C Diff interpretation Negative for toxigenic C. difficile  Final      Studies/Results: Dg Chest 2 View  12/22/2015  CLINICAL DATA:  Productive cough for 1 week. EXAM: CHEST  2 VIEW COMPARISON:  November 27, 2015. FINDINGS: The heart size and mediastinal contours are within normal limits. Both lungs are clear. No pneumothorax or pleural effusion is noted. The visualized skeletal structures are unremarkable. IMPRESSION: No active cardiopulmonary disease. Electronically Signed   By: Marijo Conception, M.D.   On: 12/22/2015 15:17   Ct Head Wo Contrast  12/22/2015  CLINICAL DATA:  Altered mental status. EXAM: CT HEAD WITHOUT CONTRAST TECHNIQUE: Contiguous axial images were obtained from the base of the skull through the vertex without intravenous contrast. COMPARISON:  None available currently. FINDINGS: Bony calvarium is unremarkable. Minimal diffuse cortical atrophy is noted. No mass effect or midline shift is noted. Ventricular size is within normal limits. There is no evidence of mass lesion, hemorrhage or acute infarction. IMPRESSION: Minimal diffuse cortical atrophy. No acute intracranial abnormality seen. Electronically Signed   By: Marijo Conception, M.D.   On: 12/22/2015 15:16   Mr Brain Wo Contrast  12/22/2015  CLINICAL DATA:  Initial evaluation for acute encephalopathy. EXAM: MRI HEAD WITHOUT CONTRAST TECHNIQUE: Multiplanar, multiecho pulse  sequences of the brain and surrounding structures were obtained without intravenous contrast. COMPARISON:  Prior CT from earlier the same day. FINDINGS: Study is severely limited as only a sagittal T1 weighted sequence was obtained. Patient was unable to tolerate full length of exam. Limited sagittal view of the brain demonstrates no definite acute abnormality. Midline structures intact. Pituitary gland normal. No definite mass lesion or mass effect. Study inadequate to evaluate for infarct or other acute abnormality. Mild cerebellar tonsillar ectopia of 5 mm without frank Chiari malformation. Visualized upper cervical spine normal. Bilateral sphenoid sinus disease noted. Orbits grossly normal. Bone marrow signal intensity within normal limits. No scalp soft tissue abnormality. IMPRESSION: 1. Markedly limited study with only sagittal T1 weighted sequence performed. Patient was unable to tolerate full length of exam. 2. No definite acute abnormality. 3. Cerebellar tonsillar ectopia of 5 mm without frank Chiari malformation. 4. Bilateral sphenoid sinus disease. Electronically Signed   By: Jeannine Boga M.D.   On: 12/22/2015 21:30    Medications:  Scheduled: . antiseptic oral rinse  7 mL Mouth Rinse q12n4p  . chlorhexidine  15 mL Mouth Rinse BID  . enoxaparin (LOVENOX) injection  50 mg Subcutaneous Q24H  . metoprolol  50 mg Oral BID  . potassium chloride  40 mEq Oral Once  . sodium chloride flush  3 mL Intravenous Q12H  . thiamine  100 mg Intravenous Daily   Continuous: . sodium chloride 75 mL/hr at 12/23/15 0737   [id number] **OR** acetaminophen, albuterol, loperamide, ondansetron **OR** ondansetron (ZOFRAN) IV  Assessment/Plan:  Principal Problem:   Acute encephalopathy Active Problems:   Gastroparesis   Fibromyalgia   Restless leg syndrome   OSA (obstructive sleep apnea)   Obesity, Class II, BMI 35-39.9, with comorbidity (HCC)   Serotonin syndrome    Acute  encephalopathy Patient has significantly improved. Etiology , however remains unclear. She is on multiple psychotropic medications at home. She was also on Levaquin recently. There was a concern regarding serotonin syndrome. CK level has been normal. Patient has been seen by neurology. MRI brain  was a limited study due to motion. No obvious stroke was identified. Ammonia level was normal. B-12 level is 752. TSH is 1.79. Urine drug screen was positive for benzodiazepines only. No obvious infectious source has been identified. C. difficile was negative. EEG does not show any epileptiform activity. Diffuse slowing was noted. Mental status has improved. Seen by speech therapist and is on a diet now. Continue to monitor for now. PT and OT evaluation.  Cough with wheezing Physical examination does reveal more wheezing today. She also mentions a cough. No fever has been noted. Repeat chest x-ray. She is getting over episode of bronchitis. Nebulizer treatments and antitussive agents.  Recent UTI with Proteus Patient was treated with Levaquin and has completed course. Repeat UA does not suggest infection.  Recently diagnosed sinusitis Patient was given a prescription for Biaxin on 1/25 by her PCP. She has not started taking this medication yet. Hold off on starting antibiotics for now.  History of gastroparesis Anti-emetics as needed.  History of restless leg syndrome and fibromyalgia Monitor for now. Holding her medications due to acute encephalopathy.  History of psychiatric illness She is followed by Dr. Caprice Beaver a psychiatrist in [place]. I've explained to the patient and husband that she will need to see this physician within the next few days after discharge to review all her medications and to see which one's can be resumed.  History of obstructive sleep apnea CPAP  DVT Prophylaxis:  Lovenox    Code Status:  Full code  Family Communication:  Discussed with the patient, husband and son   Disposition Plan:  Patient is improving slowly. Okay for transfer to telemetry. Anticipate discharge next 24 -48 hours.    LOS: 2 days   [facility] Pager [phone] 12/24/2015, 7:33 AM  If 7PM-7AM, please contact night-coverage at [email or web address], password [facility]

## 2015-12-24 NOTE — Progress Notes (Signed)
Pt arrived to unit from ICU in bed. Assisted to floor bed by ICU staff. Pt oriented to callbell and environment. POC discussed. Son bedside. VSS. Pt denies pain/discomfort at present.

## 2015-12-24 NOTE — Progress Notes (Signed)
RT spoke with Pt regarding CPAP tonight.  Pt stated that she wished to use O2 tonight because her husband is bringing her home CPAP in tomorrow morning.  RT offered hospital CPAP machine and pt declined.  RT to monitor and assess as needed.

## 2015-12-25 DIAGNOSIS — G2581 Restless legs syndrome: Secondary | ICD-10-CM

## 2015-12-25 DIAGNOSIS — R112 Nausea with vomiting, unspecified: Secondary | ICD-10-CM

## 2015-12-25 LAB — BASIC METABOLIC PANEL
Anion gap: 10 (ref 5–15)
BUN: 8 mg/dL (ref 6–20)
CO2: 26 mmol/L (ref 22–32)
Calcium: 8.9 mg/dL (ref 8.9–10.3)
Chloride: 106 mmol/L (ref 101–111)
Creatinine, Ser: 0.75 mg/dL (ref 0.44–1.00)
GFR calc Af Amer: >60 mL/min (ref >60)
GFR calc non Af Amer: >60 mL/min (ref >60)
Glucose, Bld: 111 mg/dL — ABNORMAL HIGH (ref 65–99)
Potassium: 3.6 mmol/L (ref 3.5–5.1)
Sodium: 142 mmol/L (ref 135–145)

## 2015-12-25 MED ORDER — POTASSIUM CHLORIDE CRYS ER 20 MEQ PO TBCR
40.0000 meq | EXTENDED_RELEASE_TABLET | Freq: Once | ORAL | Status: AC
  Administered 2015-12-25: 40 meq via ORAL
  Filled 2015-12-25: qty 2

## 2015-12-25 MED ORDER — VITAMIN B-1 100 MG PO TABS
100.0000 mg | ORAL_TABLET | Freq: Every day | ORAL | Status: DC
  Administered 2015-12-26: 100 mg via ORAL
  Filled 2015-12-25: qty 1

## 2015-12-25 MED ORDER — PROMETHAZINE HCL 25 MG/ML IJ SOLN
12.5000 mg | Freq: Four times a day (QID) | INTRAMUSCULAR | Status: DC | PRN
  Administered 2015-12-25 – 2015-12-26 (×2): 12.5 mg via INTRAVENOUS
  Filled 2015-12-25 (×2): qty 1

## 2015-12-25 MED ORDER — PROMETHAZINE HCL 25 MG/ML IJ SOLN
12.5000 mg | Freq: Three times a day (TID) | INTRAMUSCULAR | Status: DC | PRN
Start: 2015-12-25 — End: 2015-12-25
  Administered 2015-12-25: 12.5 mg via INTRAVENOUS
  Filled 2015-12-25: qty 1

## 2015-12-25 MED ORDER — ALPRAZOLAM 0.25 MG PO TABS
0.2500 mg | ORAL_TABLET | Freq: Three times a day (TID) | ORAL | Status: DC | PRN
  Administered 2015-12-25: 0.25 mg via ORAL
  Filled 2015-12-25: qty 1

## 2015-12-25 MED ORDER — DULOXETINE HCL 30 MG PO CPEP
30.0000 mg | ORAL_CAPSULE | Freq: Two times a day (BID) | ORAL | Status: DC
  Administered 2015-12-25 – 2015-12-26 (×3): 30 mg via ORAL
  Filled 2015-12-25 (×3): qty 1

## 2015-12-25 MED ORDER — ALPRAZOLAM 0.5 MG PO TABS
0.5000 mg | ORAL_TABLET | Freq: Once | ORAL | Status: AC
Start: 2015-12-25 — End: 2015-12-25
  Administered 2015-12-25: 0.5 mg via ORAL
  Filled 2015-12-25: qty 1

## 2015-12-25 NOTE — Progress Notes (Signed)
Initial Nutrition Assessment  DOCUMENTATION CODES:   Obesity unspecified  INTERVENTION:   Encourage PO intake RD to continue to monitor  NUTRITION DIAGNOSIS:   Unintentional weight loss related to nausea, vomiting (gastroparesis) as evidenced by percent weight loss.  GOAL:   Patient will meet greater than or equal to 90% of their needs  MONITOR:   PO intake, Labs, Weight trends, I & O's  REASON FOR ASSESSMENT:   Malnutrition Screening Tool    ASSESSMENT:   60 year old Caucasian female with a past medical history of fibromyalgia, chronic fatigue syndrome, gastroparesis, restless leg syndrome, who presented to the emergency department with the worsening confusion. There was concern for serotonin syndrome.   Pt in room with husband at bedside. Pt reports feeling nauseous and not eating well. Pt was able to eat most of a cheese omelette this morning. Per husband, since Wednesday 1/25 pt was confused and was unable to take in much. Pt with history of gastroparesis. Reviewed low fiber diet with gastroparesis with pt and pt's husband. Since 1/05, pt has lost 12 lb (5% wt loss x 3.5 weeks, significant for time frame). Pt declines nutritional supplements at this time. No signs of muscle and fat depletion.  Labs reviewed.  Diet Order:  Diet regular Room service appropriate?: Yes; Fluid consistency:: Thin  Skin:  Reviewed, no issues  Last BM:  1/29  Height:   Ht Readings from Last 1 Encounters:  12/22/15 5\' 3"  (1.6 m)    Weight:   Wt Readings from Last 1 Encounters:  12/22/15 210 lb 15.7 oz (95.7 kg)    Ideal Body Weight:  52.3 kg  BMI:  Body mass index is 37.38 kg/(m^2).  Estimated Nutritional Needs:   Kcal:  1600-1800  Protein:  65-75g  Fluid:  1.8L/day  EDUCATION NEEDS:   Education needs addressed  Clayton Bibles, MS, RD, LDN Pager: [phone] After Hours Pager: [phone]

## 2015-12-25 NOTE — Progress Notes (Signed)
[facility] PROGRESS NOTE  [name] [id number] DOB: [birth year] DOA: 12/22/2015  PCP: Vikki Ports, MD  Brief HPI: 59 year old Caucasian female with a past medical history of fibromyalgia, chronic fatigue syndrome, gastroparesis, restless leg syndrome, who presented to the emergency department with the worsening confusion. There was concern for serotonin syndrome. Patient was hospitalized for further management.  Past medical history:  Past Medical History  Diagnosis Date  . Fibromyalgia   . GERD (gastroesophageal reflux disease)   . Obstructive sleep apnea     On Cpap 2009  . Anxiety   . Depression   . Restless leg syndrome   . Edema   . Migraine   . Hyperlipidemia   . Anemia     previously followed by Dr. Jamse Arn for anemia and elevated platelets  . Paresthesia     Dr. Everette Rank at [facility]  . Tremor     Dr. Everette Rank  . Gastroparesis     followed at [facility]  . Pneumonia     2012  . Plantar fasciitis 02/2011    R foot  . S/P endometrial ablation 08/09/2006    Novasure Ablation  . C. difficile colitis 10/01/12    treated by [facility]  . Dyssynergia     dyssynergenic defecation, contributing to fecal incontinence.  . Kidney stone   . Pelvic floor dysfunction     pelvic floor dyssynergy  . Neuropathy ([employer])   . DDD (degenerative disc disease), lumbar 08/19/14    and facet arthroplasty & left lumbar radiculopathy (Dr.Ramos)  . S/P epidural steroid injection 09/20/14    Dr.Ramos  . PONV (postoperative nausea and vomiting)     pt states has gastroparesis has difficulty taking antibiotics and narcotics has severe nausea and vomiting   . Shortness of breath dyspnea     bending over; exertion   . Urinary frequency   . Urinary incontinence   . Chronic fatigue syndrome   . Lumbar radiculopathy     Consultants: Neurology  Procedures:  EEG "Impression: This awake EEG is abnormal due to mild to moderate diffuse slowing of the waking  background."   Antibiotics: None  Subjective: Patient had an episode of diaphoresis overnight when she sweated quite a bit. She also had episodes of nausea and dry heaves. Denies any abdominal pain. Overall, she feels her mentation is better. Less confused.   Objective: Vital Signs  Filed Vitals:   12/24/15 1444 12/24/15 2004 12/24/15 2151 12/25/15 0453  BP:   144/66 123/77  Pulse:   95 84  Temp:   98.2 F (36.8 C) 97.7 F (36.5 C)  TempSrc:   Oral Oral  Resp:   18 18  Height:      Weight:      SpO2: 98% 95% 96% 97%    Intake/Output Summary (Last 24 hours) at 12/25/15 0807 Last data filed at 12/25/15 0740  Gross per 24 hour  Intake 1597.92 ml  Output    650 ml  Net 947.92 ml   Filed Weights   12/22/15 2200  Weight: 95.7 kg (210 lb 15.7 oz)    General appearance: alert, cooperative, distracted and no distress Resp: Coarse breath sounds bilaterally. No wheezes heard. No rhonchi.  Cardio: regular rate and rhythm, S1, S2 normal, no murmur, click, rub or gallop GI: Abdomen remains soft. Nontender, nondistended. Bowel sounds are present. No masses or organomegaly.  Neurologic: She is awake and alert. she is oriented to person, place, year, month, date. No facial asymmetry.  Tongue is midline. Motor strength equal bilateral upper and lower extremities.  Lab Results:  Basic Metabolic Panel:  Recent Labs Lab 12/22/15 1545 12/23/15 0322 12/24/15 0349 12/25/15 0631  NA 142 149* 144 142  K 3.7 3.5 3.4* 3.6  CL 110 112* 110 106  CO2 24 25 26 26   GLUCOSE 94 101* 102* 111*  BUN 15 15 10 8   CREATININE 0.78 0.78 0.66 0.75  CALCIUM 8.6* 8.7* 8.4* 8.9   Liver Function Tests:  Recent Labs Lab 12/22/15 1545 12/23/15 0322  AST 16 18  ALT 19 17  ALKPHOS 97 94  BILITOT 0.5 0.4  PROT 6.8 6.5  ALBUMIN 3.5 3.3*    Recent Labs Lab 12/22/15 1803  AMMONIA 18   CBC:  Recent Labs Lab 12/22/15 1545 12/23/15 0322 12/24/15 0349  WBC 11.0* 8.8 9.5  HGB 11.9* 11.3*  11.6*  HCT 37.5 37.0 36.3  MCV 85.8 88.1 87.9  PLT 356 363 356   Cardiac Enzymes:  Recent Labs Lab 12/22/15 1802 12/24/15 0349  CKTOTAL 49 56   BNP (last 3 results)  Recent Labs  11/27/15 1948  BNP 45.3    CBG:  Recent Labs Lab 12/22/15 1528  GLUCAP 83    Recent Results (from the past 240 hour(s))  Urine culture     Status: None   Collection Time: 12/22/15  6:11 PM  Result Value Ref Range Status   Specimen Description URINE, RANDOM  Final   Special Requests NONE  Final   Culture   Final    NO GROWTH 1 DAY Performed at [facility]    Report Status 12/23/2015 FINAL  Final  Gastrointestinal Panel by PCR , Stool     Status: None   Collection Time: 12/22/15  6:12 PM  Result Value Ref Range Status   Campylobacter species NOT DETECTED NOT DETECTED Final   Plesimonas shigelloides NOT DETECTED NOT DETECTED Final   Salmonella species NOT DETECTED NOT DETECTED Final   Yersinia enterocolitica NOT DETECTED NOT DETECTED Final   Vibrio species NOT DETECTED NOT DETECTED Final   Vibrio cholerae NOT DETECTED NOT DETECTED Final   Enteroaggregative E coli (EAEC) NOT DETECTED NOT DETECTED Final   Enteropathogenic E coli (EPEC) NOT DETECTED NOT DETECTED Final   Enterotoxigenic E coli (ETEC) NOT DETECTED NOT DETECTED Final   Shiga like toxin producing E coli (STEC) NOT DETECTED NOT DETECTED Final   E. coli O157 NOT DETECTED NOT DETECTED Final   Shigella/Enteroinvasive E coli (EIEC) NOT DETECTED NOT DETECTED Final   Cryptosporidium NOT DETECTED NOT DETECTED Final   Cyclospora cayetanensis NOT DETECTED NOT DETECTED Final   Entamoeba histolytica NOT DETECTED NOT DETECTED Final   Giardia lamblia NOT DETECTED NOT DETECTED Final   Adenovirus F40/41 NOT DETECTED NOT DETECTED Final   Astrovirus NOT DETECTED NOT DETECTED Final   Norovirus GI/GII NOT DETECTED NOT DETECTED Final   Rotavirus A NOT DETECTED NOT DETECTED Final   Sapovirus (I, II, IV, and V) NOT DETECTED NOT DETECTED  Final  MRSA PCR Screening     Status: None   Collection Time: 12/22/15  7:53 PM  Result Value Ref Range Status   MRSA by PCR NEGATIVE NEGATIVE Final    Comment:        The GeneXpert MRSA Assay (FDA approved for NASAL specimens only), is one component of a comprehensive MRSA colonization surveillance program. It is not intended to diagnose MRSA infection nor to guide or monitor treatment for MRSA infections.   C  difficile quick scan w PCR reflex     Status: None   Collection Time: 12/22/15 10:08 PM  Result Value Ref Range Status   C Diff antigen NEGATIVE NEGATIVE Final   C Diff toxin NEGATIVE NEGATIVE Final   C Diff interpretation Negative for toxigenic C. difficile  Final      Studies/Results: Dg Chest 2 View  12/24/2015  CLINICAL DATA:  60 year old female with productive cough and wheezing today. EXAM: CHEST  2 VIEW COMPARISON:  12/22/2015 and prior exams FINDINGS: The cardiomediastinal silhouette is unremarkable. Mild chronic peribronchial thickening again noted. There is no evidence of focal airspace disease, pulmonary edema, suspicious pulmonary nodule/mass, pleural effusion, or pneumothorax. No acute bony abnormalities are identified. IMPRESSION: No evidence of acute cardiopulmonary disease. Electronically Signed   By: Margarette Canada M.D.   On: 12/24/2015 08:51    Medications:  Scheduled: . ALPRAZolam  0.5 mg Oral Once  . antiseptic oral rinse  7 mL Mouth Rinse q12n4p  . benzonatate  100 mg Oral TID  . chlorhexidine  15 mL Mouth Rinse BID  . DULoxetine  30 mg Oral BID  . enoxaparin (LOVENOX) injection  50 mg Subcutaneous Q24H  . ipratropium-albuterol  3 mL Nebulization BID  . metoprolol  50 mg Oral BID  . sodium chloride flush  3 mL Intravenous Q12H  . thiamine  100 mg Intravenous Daily   Continuous: . sodium chloride 50 mL/hr at 12/24/15 2122   [id number] **OR** acetaminophen, albuterol, ALPRAZolam, loperamide, ondansetron **OR** ondansetron (ZOFRAN) IV,  promethazine  Assessment/Plan:  Principal Problem:   Acute encephalopathy Active Problems:   Gastroparesis   Fibromyalgia   Restless leg syndrome   OSA (obstructive sleep apnea)   Obesity, Class II, BMI 35-39.9, with comorbidity (HCC)   Serotonin syndrome    Acute encephalopathy Patient has significantly improved. Etiology , however remains unclear. She is on multiple psychotropic medications at home. She was also on Levaquin recently. There was a concern regarding serotonin syndrome. CK level has been normal. Patient has been seen by neurology. MRI brain was a limited study due to motion. No obvious stroke was identified. Ammonia level was normal. B-12 level is 752. TSH is 1.79. Urine drug screen was positive for benzodiazepines only. No obvious infectious source has been identified. C. difficile was negative. EEG does not show any epileptiform activity. Diffuse slowing was noted. Mental status has improved. Seen by speech therapist and is on a diet now. Continue to monitor for now. PT and OT evaluation.  Nausea and Vomiting Abdomen is completely benign. Her symptoms could be reflective of the withdrawal process. She was on multiple psychotropic medications at home which have been held due to her encephalopathy. We will reinitiate Cymbalta at a lower dose. We will also reinitiate her Xanax at a lower dose. Monitor for now. Anti-emetics as needed.  Cough with wheezing Lungs sound better today. Chest x-ray did not show any new process. She likely has lingering symptoms due to her recent bronchitis. I have reassured the patient and her husband. Symptomatic treatment only for now. Avoid antibiotics. Nebulizer treatments and antitussive agents.  Recent UTI with Proteus Patient was treated with Levaquin and has completed course. Repeat UA does not suggest infection.  Recently diagnosed sinusitis Patient was given a prescription for Biaxin on 1/25 by her PCP. She has not started taking this  medication yet. Hold off on starting antibiotics for now.  History of gastroparesis Anti-emetics as needed.  History of restless leg syndrome and fibromyalgia Monitor  for now. Holding her medications due to acute encephalopathy.  History of psychiatric illness She is followed by Dr. Caprice Beaver a psychiatrist in [place]. I've explained to the patient and husband that she will need to see this physician within a few days after discharge to review all her medications and to see which one's can be resumed. Started back on Cymbalta at a lower dose along with Xanax.  History of obstructive sleep apnea CPAP  DVT Prophylaxis:  Lovenox    Code Status:  Full code  Family Communication:  Discussed with the patient, husband and son  Disposition Plan:  Patient is improving slowly. Anticipate discharge next 24 -48 hours. Continue to mobilize.   LOS: 3 days   [facility] Pager [phone] 12/25/2015, 8:07 AM  If 7PM-7AM, please contact night-coverage at [email or web address], password [facility]

## 2015-12-25 NOTE — Progress Notes (Signed)
Pt requested this RN to assess her R foot. Area of redness and warmth noted on anterior inner aspect of foot, above largest metatarsal, slightly tender to touch. Pt states it was "sore" when she ambulated in hallway. Ice pack placed. Will monitor closely.  Lind Guest, RN

## 2015-12-25 NOTE — Evaluation (Signed)
Occupational Therapy Evaluation Patient Details Name: [name] MRN: [id number] DOB: [birth year] Today's Date: 12/25/2015    History of Present Illness [name] is a 60 y.o. female with a past medical history of fibromyalgia, chronic fatigue syndrome, gastroparesis, restless leg syndrome, R TKA  who was diagnosed with a urinary tract infection about 10 days ago and was treated with Levaquin. Patient was seen by her primary care physician yesterday with complaints of low-grade fever, cough and was diagnosed with sinusitis.  Patient was brought in 12/22/15  by her husband due to worsening confusion, s/p fall at home   Clinical Impression   Pt admitted with confusion and fall . Pt currently with functional limitations due to the deficits listed below (see OT Problem List). Pt will benefit from skilled OT to increase their safety and independence with ADL and functional mobility for ADL to facilitate discharge to venue listed below.      Follow Up Recommendations  SNF;Supervision/Assistance - 24 hour;Home health OT;Other (comment) (depending on progress and family availbility)          Precautions / Restrictions Precautions Precautions: Fall Restrictions Weight Bearing Restrictions: No      Mobility Bed Mobility Overal bed mobility: Needs Assistance Bed Mobility: Supine to Sit     Supine to sit: Min assist        Transfers Overall transfer level: Needs assistance Equipment used: Rolling walker (2 wheeled);1 person hand held assist Transfers: Sit to/from [facility] Sit to Stand: Mod assist Stand pivot transfers: Mod assist                 ADL Overall ADL's : Needs assistance/impaired Eating/Feeding: Set up;Sitting   Grooming: Minimal assistance;Standing   Upper Body Bathing: Set up;Sitting   Lower Body Bathing: Moderate assistance;Sit to/from stand;Cueing for safety;Cueing for sequencing   Upper Body Dressing : Minimal  assistance;Sitting   Lower Body Dressing: Moderate assistance;Sit to/from stand   Toilet Transfer: Moderate assistance;RW;Cueing for sequencing;Ambulation;Comfort height toilet;Cueing for safety   Toileting- Clothing Manipulation and Hygiene: Moderate assistance;Sit to/from stand;Cueing for sequencing;Cueing for safety       Functional mobility during ADLs: Moderate assistance General ADL Comments: pt may need ST SNF depending on progress               Pertinent Vitals/Pain Pain Assessment: No/denies pain        Extremity/Trunk Assessment Upper Extremity Assessment Upper Extremity Assessment: Generalized weakness           Communication Communication Communication: No difficulties   Cognition Arousal/Alertness: Awake/alert Behavior During Therapy: WFL for tasks assessed/performed Overall Cognitive Status: Within Functional Limits for tasks assessed                                Home Living Family/patient expects to be discharged to:: Private residence Living Arrangements: Spouse/significant other Available Help at Discharge: Family Type of Home: House Home Access: Stairs to enter Technical brewer of Steps: 1/2 + 1 Entrance Stairs-Rails: None Home Layout: One level     Bathroom Shower/Tub: Occupational psychologist: Standard     Home Equipment: Bedside commode;Walker - 2 wheels          Prior Functioning/Environment Level of Independence: Independent             OT Diagnosis: Generalized weakness   OT Problem List: Decreased activity tolerance;Decreased strength;Decreased safety awareness   OT Treatment/Interventions: Self-care/ADL  training;DME and/or AE instruction;Patient/family education    OT Goals(Current goals can be found in the care plan section) Acute Rehab OT Goals Patient Stated Goal: get stronger OT Goal Formulation: With patient Time For Goal Achievement: 01-05-16 Potential to Achieve Goals: Good  OT  Frequency: Min 2X/week              End of Session    Activity Tolerance: Patient tolerated treatment well Patient left: in chair;with call bell/phone within reach;with family/visitor present   Time: [id number] OT Time Calculation (min): 26 min Charges:  OT General Charges $OT Visit: 1 Procedure OT Evaluation $OT Eval Low Complexity: 1 Procedure OT Treatments $Self Care/Home Management : 8-22 mins G-Codes:    Payton Mccallum D 01/05/16, 12:22 PM

## 2015-12-25 NOTE — Progress Notes (Signed)
Pt has home CPAP for use tonight.  RT inspected for visible damage or frayed wires, none were present.  Pt stated that she would self administer CPAP when ready for bed.  RT to monitor and assess as needed.

## 2015-12-26 ENCOUNTER — Inpatient Hospital Stay (HOSPITAL_COMMUNITY): Payer: 59

## 2015-12-26 DIAGNOSIS — R609 Edema, unspecified: Secondary | ICD-10-CM

## 2015-12-26 LAB — BASIC METABOLIC PANEL
Anion gap: 11 (ref 5–15)
BUN: 8 mg/dL (ref 6–20)
CO2: 27 mmol/L (ref 22–32)
Calcium: 9.2 mg/dL (ref 8.9–10.3)
Chloride: 107 mmol/L (ref 101–111)
Creatinine, Ser: 0.91 mg/dL (ref 0.44–1.00)
GFR calc Af Amer: >60 mL/min (ref >60)
GFR calc non Af Amer: >60 mL/min (ref >60)
Glucose, Bld: 110 mg/dL — ABNORMAL HIGH (ref 65–99)
Potassium: 3.8 mmol/L (ref 3.5–5.1)
Sodium: 145 mmol/L (ref 135–145)

## 2015-12-26 LAB — URIC ACID: Uric Acid, Serum: 7.3 mg/dL — ABNORMAL HIGH (ref 2.3–6.6)

## 2015-12-26 MED ORDER — COLCHICINE 0.6 MG PO TABS
0.6000 mg | ORAL_TABLET | Freq: Every day | ORAL | Status: DC
  Administered 2015-12-26: 0.6 mg via ORAL
  Filled 2015-12-26: qty 1

## 2015-12-26 MED ORDER — COLCHICINE 0.6 MG PO TABS: 0.6000 mg | ORAL_TABLET | Freq: Every day | ORAL | Status: DC

## 2015-12-26 MED ORDER — DULOXETINE HCL 30 MG PO CPEP: 30.0000 mg | ORAL_CAPSULE | Freq: Two times a day (BID) | ORAL | Status: DC

## 2015-12-26 MED ORDER — BENZONATATE 100 MG PO CAPS: 100.0000 mg | ORAL_CAPSULE | Freq: Three times a day (TID) | ORAL | Status: DC

## 2015-12-26 MED ORDER — METOPROLOL TARTRATE 75 MG PO TABS: 50.0000 mg | ORAL_TABLET | Freq: Two times a day (BID) | ORAL | Status: DC

## 2015-12-26 NOTE — Progress Notes (Signed)
Pt selected [facility] for [facility].  Referral given to in house rep.

## 2015-12-26 NOTE — Discharge Summary (Signed)
[facility]  Physician Discharge Summary   Patient ID: [name] MRN: [id number] DOB/AGE: 03/31/1956 60 y.o.  Admit date: 12/22/2015 Discharge date: 12/26/2015  PCP: KNAPP,EVE A, MD  DISCHARGE DIAGNOSES:  Principal Problem:   Acute encephalopathy Active Problems:   Gastroparesis   Fibromyalgia   Restless leg syndrome   OSA (obstructive sleep apnea)   Obesity, Class II, BMI 35-39.9, with comorbidity (HCC)   Serotonin syndrome   RECOMMENDATIONS FOR OUTPATIENT FOLLOW UP: 1. Home health physical therapy has been ordered 2. Patient will need definitive management for gout as an outpatient. 3. Her diuretics have been discontinued 4. Dose of metoprolol has been increased 5. Patient to follow-up with her psychiatrist before she can resume her psychotropic medications as discussed below.   DISCHARGE CONDITION: fair  Diet recommendation: As before  Filed Weights   12/22/15 2200  Weight: 95.7 kg (210 lb 15.7 oz)    INITIAL HISTORY: 60 year old Caucasian female with a past medical history of fibromyalgia, chronic fatigue syndrome, gastroparesis, restless leg syndrome, who presented to the emergency department with the worsening confusion. There was concern for serotonin syndrome. Patient was hospitalized for further management.  Consultations:  Neurology  Procedures: EEG "Impression: This awake EEG is abnormal due to mild to moderate diffuse slowing of the waking background."  HOSPITAL COURSE:   Acute encephalopathy Patient was admitted to the hospital with acute encephalopathy. There was a concern regarding serotonin syndrome due to multiple psychotropic medications that she was taking at home. However, her CK level was normal. Her psychotropic medications were held. She was also on Levaquin recently. She was seen by neurology. MRI brain was a limited study due to motion. No obvious stroke was identified. Ammonia level was normal. B-12 level is 752. TSH is 1.79.  Urine drug screen was positive for benzodiazepines only. No obvious infectious source has been identified. C. difficile was negative. EEG does not show any epileptiform activity. Diffuse slowing was noted. Patient's mental status slowly improved. She is now back to baseline. Etiology remains unclear. Her Cymbalta has been resumed at a lower dose. She can also resume her alprazolam. I have asked her to talk to her psychiatrist first within the next few days before resuming any of her other psychiatric medications.   Acute gout Patient started complaining of pain around her right first toe. Erythema was noted at the first MTP. Uric acid level was checked and was elevated at 7.3. Patient likely has gout. She will be prescribed colchicine. She can take anti-inflammatory agents at home. Once her acute episode of subsided, she will need definitive management. She has been asked to discuss this with her PCP. Diuretics can worsen gout. This has been discontinued for now.  Superficial thrombophlebitis She complained of pain in the right antecubital foci where she had IV access. Ultrasound was done which revealed evidence for superficial thrombophlebitis. No DVT noted. She has been asked to apply warm compresses to that area and take anti-inflammatories. I have told her that this will eventually subside.  Nausea and Vomiting secondary to known history of gastroparesis Abdomen is completely benign. Her symptoms could be reflective of the withdrawal process. But patient also has a history of gastroparesis. Symptoms are stable. She is able to tolerate her diet.   Cough with wheezing in the setting of recent bronchitis Patient is stable. Cough persists but chest x-rays have not shown any new process. She likely has lingering symptoms due to her recent bronchitis. I have reassured the patient and her  husband. Symptomatic treatment only for now. Avoid antibiotics.   Recent UTI with Proteus Patient was treated with  Levaquin and has completed course. Repeat UA does not suggest infection.  Recently diagnosed sinusitis Patient was given a prescription for Biaxin on 1/25 by her PCP. She has not started taking this medication yet. Hold off on starting antibiotics for now.  History of restless leg syndrome and fibromyalgia Monitor for now. Holding her medications. She will need to discuss this with her psychiatrist and primary care physician before resuming.  History of psychiatric illness She is followed by Dr. Caprice Beaver a psychiatrist in [place]. I've explained to the patient and husband that she will need to see this physician within a few days after discharge to review all her medications and to see which one's can be resumed. Started back on Cymbalta at a lower dose along with Xanax.  History of obstructive sleep apnea CPAP  Overall much improved. Mental status is back to baseline. Okay for discharge today.   PERTINENT LABS:  The results of significant diagnostics from this hospitalization (including imaging, microbiology, ancillary and laboratory) are listed below for reference.    Microbiology: Recent Results (from the past 240 hour(s))  Urine culture     Status: None   Collection Time: 12/22/15  6:11 PM  Result Value Ref Range Status   Specimen Description URINE, RANDOM  Final   Special Requests NONE  Final   Culture   Final    NO GROWTH 1 DAY Performed at [facility]    Report Status 12/23/2015 FINAL  Final  Gastrointestinal Panel by PCR , Stool     Status: None   Collection Time: 12/22/15  6:12 PM  Result Value Ref Range Status   Campylobacter species NOT DETECTED NOT DETECTED Final   Plesimonas shigelloides NOT DETECTED NOT DETECTED Final   Salmonella species NOT DETECTED NOT DETECTED Final   Yersinia enterocolitica NOT DETECTED NOT DETECTED Final   Vibrio species NOT DETECTED NOT DETECTED Final   Vibrio cholerae NOT DETECTED NOT DETECTED Final   Enteroaggregative E coli  (EAEC) NOT DETECTED NOT DETECTED Final   Enteropathogenic E coli (EPEC) NOT DETECTED NOT DETECTED Final   Enterotoxigenic E coli (ETEC) NOT DETECTED NOT DETECTED Final   Shiga like toxin producing E coli (STEC) NOT DETECTED NOT DETECTED Final   E. coli O157 NOT DETECTED NOT DETECTED Final   Shigella/Enteroinvasive E coli (EIEC) NOT DETECTED NOT DETECTED Final   Cryptosporidium NOT DETECTED NOT DETECTED Final   Cyclospora cayetanensis NOT DETECTED NOT DETECTED Final   Entamoeba histolytica NOT DETECTED NOT DETECTED Final   Giardia lamblia NOT DETECTED NOT DETECTED Final   Adenovirus F40/41 NOT DETECTED NOT DETECTED Final   Astrovirus NOT DETECTED NOT DETECTED Final   Norovirus GI/GII NOT DETECTED NOT DETECTED Final   Rotavirus A NOT DETECTED NOT DETECTED Final   Sapovirus (I, II, IV, and V) NOT DETECTED NOT DETECTED Final  MRSA PCR Screening     Status: None   Collection Time: 12/22/15  7:53 PM  Result Value Ref Range Status   MRSA by PCR NEGATIVE NEGATIVE Final    Comment:        The GeneXpert MRSA Assay (FDA approved for NASAL specimens only), is one component of a comprehensive MRSA colonization surveillance program. It is not intended to diagnose MRSA infection nor to guide or monitor treatment for MRSA infections.   C difficile quick scan w PCR reflex     Status: None  Collection Time: 12/22/15 10:08 PM  Result Value Ref Range Status   C Diff antigen NEGATIVE NEGATIVE Final   C Diff toxin NEGATIVE NEGATIVE Final   C Diff interpretation Negative for toxigenic C. difficile  Final     Labs: Basic Metabolic Panel:  Recent Labs Lab 12/22/15 1545 12/23/15 0322 12/24/15 0349 12/25/15 0631 12/26/15 0520  NA 142 149* 144 142 145  K 3.7 3.5 3.4* 3.6 3.8  CL 110 112* 110 106 107  CO2 24 25 26 26 27   GLUCOSE 94 101* 102* 111* 110*  BUN 15 15 10 8 8   CREATININE 0.78 0.78 0.66 0.75 0.91  CALCIUM 8.6* 8.7* 8.4* 8.9 9.2   Liver Function Tests:  Recent Labs Lab  12/22/15 1545 12/23/15 0322  AST 16 18  ALT 19 17  ALKPHOS 97 94  BILITOT 0.5 0.4  PROT 6.8 6.5  ALBUMIN 3.5 3.3*    Recent Labs Lab 12/22/15 1803  AMMONIA 18   CBC:  Recent Labs Lab 12/22/15 1545 12/23/15 0322 12/24/15 0349  WBC 11.0* 8.8 9.5  HGB 11.9* 11.3* 11.6*  HCT 37.5 37.0 36.3  MCV 85.8 88.1 87.9  PLT 356 363 356   Cardiac Enzymes:  Recent Labs Lab 12/22/15 1802 12/24/15 0349  CKTOTAL 49 56   BNP: BNP (last 3 results)  Recent Labs  11/27/15 1948  BNP 45.3   CBG:  Recent Labs Lab 12/22/15 1528  GLUCAP 83     IMAGING STUDIES Dg Chest 2 View  12/24/2015  CLINICAL DATA:  60 year old female with productive cough and wheezing today. EXAM: CHEST  2 VIEW COMPARISON:  12/22/2015 and prior exams FINDINGS: The cardiomediastinal silhouette is unremarkable. Mild chronic peribronchial thickening again noted. There is no evidence of focal airspace disease, pulmonary edema, suspicious pulmonary nodule/mass, pleural effusion, or pneumothorax. No acute bony abnormalities are identified. IMPRESSION: No evidence of acute cardiopulmonary disease. Electronically Signed   By: Margarette Canada M.D.   On: 12/24/2015 08:51   Dg Chest 2 View  12/22/2015  CLINICAL DATA:  Productive cough for 1 week. EXAM: CHEST  2 VIEW COMPARISON:  November 27, 2015. FINDINGS: The heart size and mediastinal contours are within normal limits. Both lungs are clear. No pneumothorax or pleural effusion is noted. The visualized skeletal structures are unremarkable. IMPRESSION: No active cardiopulmonary disease. Electronically Signed   By: Marijo Conception, M.D.   On: 12/22/2015 15:17   Ct Head Wo Contrast  12/22/2015  CLINICAL DATA:  Altered mental status. EXAM: CT HEAD WITHOUT CONTRAST TECHNIQUE: Contiguous axial images were obtained from the base of the skull through the vertex without intravenous contrast. COMPARISON:  None available currently. FINDINGS: Bony calvarium is unremarkable. Minimal  diffuse cortical atrophy is noted. No mass effect or midline shift is noted. Ventricular size is within normal limits. There is no evidence of mass lesion, hemorrhage or acute infarction. IMPRESSION: Minimal diffuse cortical atrophy. No acute intracranial abnormality seen. Electronically Signed   By: Marijo Conception, M.D.   On: 12/22/2015 15:16   Mr Brain Wo Contrast  12/22/2015  CLINICAL DATA:  Initial evaluation for acute encephalopathy. EXAM: MRI HEAD WITHOUT CONTRAST TECHNIQUE: Multiplanar, multiecho pulse sequences of the brain and surrounding structures were obtained without intravenous contrast. COMPARISON:  Prior CT from earlier the same day. FINDINGS: Study is severely limited as only a sagittal T1 weighted sequence was obtained. Patient was unable to tolerate full length of exam. Limited sagittal view of the brain demonstrates no definite acute abnormality.  Midline structures intact. Pituitary gland normal. No definite mass lesion or mass effect. Study inadequate to evaluate for infarct or other acute abnormality. Mild cerebellar tonsillar ectopia of 5 mm without frank Chiari malformation. Visualized upper cervical spine normal. Bilateral sphenoid sinus disease noted. Orbits grossly normal. Bone marrow signal intensity within normal limits. No scalp soft tissue abnormality. IMPRESSION: 1. Markedly limited study with only sagittal T1 weighted sequence performed. Patient was unable to tolerate full length of exam. 2. No definite acute abnormality. 3. Cerebellar tonsillar ectopia of 5 mm without frank Chiari malformation. 4. Bilateral sphenoid sinus disease. Electronically Signed   By: Jeannine Boga M.D.   On: 12/22/2015 21:30    DISCHARGE EXAMINATION: Filed Vitals:   12/25/15 1943 12/25/15 2126 12/26/15 0443 12/26/15 0840  BP:  158/80 160/73   Pulse:  97 80   Temp:  98.1 F (36.7 C) 97.5 F (36.4 C)   TempSrc:  Oral Oral   Resp:  18 18   Height:      Weight:      SpO2: 97% 98% 100%  98%   General appearance: alert, cooperative, appears stated age and no distress Resp: clear to auscultation bilaterally Cardio: regular rate and rhythm, S1, S2 normal, no murmur, click, rub or gallop GI: soft, non-tender; bowel sounds normal; no masses,  no organomegaly Extremities: Some warmth and tenderness in the right antecubital fossa. Erythema noted over the first right MTP joint.  DISPOSITION: Home with family  Discharge Instructions    Call MD for:  difficulty breathing, headache or visual disturbances    Complete by:  As directed      Call MD for:  extreme fatigue    Complete by:  As directed      Call MD for:  persistant dizziness or light-headedness    Complete by:  As directed      Call MD for:  persistant nausea and vomiting    Complete by:  As directed      Call MD for:  severe uncontrolled pain    Complete by:  As directed      Call MD for:  temperature >100.4    Complete by:  As directed      Diet - low sodium heart healthy    Complete by:  As directed      Discharge instructions    Complete by:  As directed   You will need to discuss with your PCP regarding the elevated uric acid. Your Dyazide has been stopped as it can worsen the uric acid levels. As a result the dose of Metoprolol has been increased. Apply warm compresses to the right arm. You need to see your psychiatrist this week to go over your medications and your PCP next week for all other issues.  You were cared for by a hospitalist during your hospital stay. If you have any questions about your discharge medications or the care you received while you were in the hospital after you are discharged, you can call the unit and asked to speak with the hospitalist on call if the hospitalist that took care of you is not available. Once you are discharged, your primary care physician will handle any further medical issues. Please note that NO REFILLS for any discharge medications will be authorized once you are  discharged, as it is imperative that you return to your primary care physician (or establish a relationship with a primary care physician if you do not have one) for your aftercare needs  so that they can reassess your need for medications and monitor your lab values. If you do not have a primary care physician, you can call [phone] for a physician referral.     Increase activity slowly    Complete by:  As directed            ALLERGIES:  Allergies  Allergen Reactions  . Erythromycin Nausea Only  . Tramadol Itching  . Dilaudid [Hydromorphone Hcl] Itching     Current Discharge Medication List    START taking these medications   Details  benzonatate (TESSALON) 100 MG capsule Take 1 capsule (100 mg total) by mouth 3 (three) times daily. Qty: 30 capsule, Refills: 0    colchicine 0.6 MG tablet Take 1 tablet (0.6 mg total) by mouth daily. Qty: 30 tablet, Refills: 0      CONTINUE these medications which have CHANGED   Details  DULoxetine (CYMBALTA) 30 MG capsule Take 1 capsule (30 mg total) by mouth 2 (two) times daily. Qty: 60 capsule, Refills: 0    metoprolol 75 MG TABS Take 50 mg by mouth 2 (two) times daily. Qty: 60 tablet, Refills: 0      CONTINUE these medications which have NOT CHANGED   Details  acetaminophen (TYLENOL) 500 MG tablet Take 1,000 mg by mouth every 6 (six) hours as needed for mild pain. Reported on 12/12/2015    albuterol (PROVENTIL HFA;VENTOLIN HFA) 108 (90 Base) MCG/ACT inhaler Inhale into the lungs every 6 (six) hours as needed for wheezing or shortness of breath. Reported on 12/12/2015    ALPRAZolam (XANAX) 0.25 MG tablet Take 0.25 mg by mouth 3 (three) times daily as needed for anxiety.     atorvastatin (LIPITOR) 40 MG tablet TAKE 1 TABLET (40 MG TOTAL) BY MOUTH DAILY. Qty: 90 tablet, Refills: 1    cetirizine (ZYRTEC) 10 MG tablet Take 10 mg by mouth at bedtime.    estradiol (VIVELLE-DOT) 0.05 MG/24HR patch Place 1 patch onto the skin twice a  week. Refills: 9    fluticasone (FLONASE) 50 MCG/ACT nasal spray Place 2 sprays into both nostrils daily. Qty: 16 g, Refills: 11   Associated Diagnoses: Allergic rhinitis, unspecified allergic rhinitis type    meloxicam (MOBIC) 15 MG tablet Take 15 mg by mouth daily. Reported on 12/21/2015    methocarbamol (ROBAXIN) 500 MG tablet Take 1 tablet (500 mg total) by mouth every 6 (six) hours as needed for muscle spasms. Qty: 80 tablet, Refills: 0    Multiple Vitamins-Minerals (PRESERVISION AREDS PO) Take 1 capsule by mouth daily.    ondansetron (ZOFRAN) 8 MG tablet Take 8 mg by mouth every 8 (eight) hours as needed for nausea or vomiting. Reported on 12/12/2015    pantoprazole (PROTONIX) 40 MG tablet Take 40 mg by mouth 2 (two) times daily.    Probiotic Product ([place]) CAPS Take 1 capsule by mouth daily.    prochlorperazine (COMPAZINE) 10 MG tablet Take 10 mg by mouth every 6 (six) hours as needed for nausea or vomiting.    progesterone (PROMETRIUM) 100 MG capsule Take 100 mg by mouth at bedtime.     promethazine (PHENERGAN) 25 MG suppository Place 25 mg rectally every 6 (six) hours as needed for nausea or vomiting. Reported on 12/21/2015    propranolol (INDERAL) 20 MG tablet Take 40 mg by mouth at bedtime.    SUMAtriptan (IMITREX) 50 MG tablet Take 50 mg by mouth every 2 (two) hours as needed for migraine. Reported on 12/21/2015  STOP taking these medications     ARIPiprazole (ABILIFY) 5 MG tablet      Armodafinil (NUVIGIL) 250 MG tablet      clarithromycin (BIAXIN) 500 MG tablet      clonazePAM (KLONOPIN) 0.5 MG tablet      guaiFENesin (MUCINEX) 600 MG 12 hr tablet      LYRICA 75 MG capsule      nortriptyline (PAMELOR) 50 MG capsule      topiramate (TOPAMAX) 25 MG tablet      triamterene-hydrochlorothiazide (DYAZIDE) 37.5-25 MG capsule      levofloxacin (LEVAQUIN) 500 MG tablet      predniSONE (DELTASONE) 20 MG tablet        Follow-up Information     Follow up with KNAPP,EVE A, MD. Schedule an appointment as soon as possible for a visit in 1 week.   Specialty:  Family Medicine   Why:  post hospitalization follow up and to discuss elevated uric acid   Contact information:   [address] [place] [address] [phone]       Follow up with [facility] ANN, MD. Schedule an appointment as soon as possible for a visit in 3 days.   Specialty:  Psychiatry   Why:  to ask which medications can be resumed and at what dose.   Contact information:   [address] [place] [address] [phone]       TOTAL DISCHARGE TIME: 35 minutes  [facility] Pager [phone]  12/26/2015, 12:53 PM

## 2015-12-26 NOTE — Progress Notes (Signed)
Occupational Therapy Treatment Patient Details Name: AICHATOU MOWRY MRN: [id number] DOB: 1956/04/14 Today's Date: 12/26/2015    History of present illness HEATHER LIKES is a 60 y.o. female with a past medical history of fibromyalgia, chronic fatigue syndrome, gastroparesis, restless leg syndrome, R TKA  who was diagnosed with a urinary tract infection about 10 days ago and was treated with Levaquin. Patient was seen by her primary care physician yesterday with complaints of low-grade fever, cough and was diagnosed with sinusitis.  Patient was brought in 12/22/15  by her husband due to worsening confusion, s/p fall at home      Follow Up Recommendations  Home health OT;Supervision - Intermittent    Equipment Recommendations  None recommended by OT    Recommendations for Other Services      Precautions / Restrictions Precautions Precautions: Fall       Mobility Bed Mobility Overal bed mobility: Modified Independent             General bed mobility comments: pt up in recliner on arrival  Transfers Overall transfer level: Needs assistance Equipment used: None Transfers: Sit to/from [employer] Sit to Stand: Supervision Stand pivot transfers: Supervision       General transfer comment: min/guard for safety, no physical assist required    Balance                                   ADL       Grooming: Supervision/safety;Standing       Lower Body Bathing: Supervison/ safety;Sit to/from stand   Upper Body Dressing : Set up;Sitting   Lower Body Dressing: Supervision/safety;Sit to/from stand   Toilet Transfer: Supervision/safety;Comfort height toilet;Ambulation   Toileting- Clothing Manipulation and Hygiene: Supervision/safety;Sit to/from stand                Vision                     Perception     Praxis      Cognition   Behavior During Therapy: [facility] for tasks assessed/performed Overall Cognitive Status:  Within Functional Limits for tasks assessed                       Extremity/Trunk Assessment               Exercises     Shoulder Instructions       General Comments      Pertinent Vitals/ Pain       Pain Assessment: No/denies pain  Home Living                                          Prior Functioning/Environment              Frequency       Progress Toward Goals  OT Goals(current goals can now be found in the care plan section)  Progress towards OT goals: Progressing toward goals     Plan Discharge plan needs to be updated    Co-evaluation                 End of Session     Activity Tolerance Patient tolerated treatment well   Patient Left in chair;with call bell/phone within reach;with family/visitor present  Nurse Communication          Time: 2952-8413 OT Time Calculation (min): 20 min  Charges: OT General Charges $OT Visit: 1 Procedure OT Treatments $Self Care/Home Management : 8-22 mins  Alfonzia Woolum, [place] 12/26/2015, 1:56 PM

## 2015-12-26 NOTE — Discharge Instructions (Signed)
Phlebitis Phlebitis is soreness and swelling (inflammation) of a vein. This can occur in your arms, legs, or torso (trunk), as well as deeper inside your body. Phlebitis is usually not serious when it occurs close to the surface of the body. However, it can cause serious problems when it occurs in a vein deeper inside the body. CAUSES  Phlebitis can be triggered by various things, including:   Reduced blood flow through your veins. This can happen with:  Bed rest over a long period.  Long-distance travel.  Injury.  Surgery.  Being overweight (obese) or pregnant.  Having an IV tube put in the vein and getting certain medicines through the vein.  Cancer and cancer treatment.  Use of illegal drugs taken through the vein.  Inflammatory diseases.  Inherited (genetic) diseases that increase the risk of blood clots.  Hormone therapy, such as birth control pills. SIGNS AND SYMPTOMS   Red, tender, swollen, and painful area on your skin. Usually, the area will be long and narrow.  Firmness along the center of the affected area. This can indicate that a blood clot has formed.  Low-grade fever. DIAGNOSIS  A health care provider can usually diagnose phlebitis by examining the affected area and asking about your symptoms. To check for infection or blood clots, your health care provider may order blood tests or an ultrasound exam of the area. Blood tests and your family history may also indicate if you have an underlying genetic disease that causes blood clots. Occasionally, a piece of tissue is taken from the body (biopsy sample) if an unusual cause of phlebitis is suspected. TREATMENT  Treatment will vary depending on the severity of the condition and the area of the body affected. Treatment may include:  Use of a warm compress or heating pad.  Use of compression stockings or bandages.  Anti-inflammatory medicines.  Removal of any IV tube that may be causing the problem.  Medicines  that kill germs (antibiotics) if an infection is present.  Blood-thinning medicines if a blood clot is suspected or present.  In rare cases, surgery may be needed to remove damaged sections of vein. HOME CARE INSTRUCTIONS   Only take over-the-counter or prescription medicines as directed by your health care provider. Take all medicines exactly as prescribed.  Raise (elevate) the affected area above the level of your heart as directed by your health care provider.  Apply a warm compress or heating pad to the affected area as directed by your health care provider. Do not sleep with the heating pad.  Use compression stockings or bandages as directed. These will speed healing and prevent the condition from coming back.  If you are on blood thinners:  Get follow-up blood tests as directed by your health care provider.  Check with your health care provider before using any new medicines.  Carry a medical alert card or wear your medical alert jewelry to show that you are on blood thinners.  For phlebitis in the legs:  Avoid prolonged standing or bed rest.  Keep your legs moving. Raise your legs when sitting or lying.  Do not smoke.  Women, particularly those over the age of 86, should consider the risks and benefits of taking the contraceptive pill. This kind of hormone treatment can increase your risk for blood clots.  Follow up with your health care provider as directed. SEEK MEDICAL CARE IF:   You have unusual bruising or any bleeding problems.  Your swelling or pain in the affected area  is not improving.  You are on anti-inflammatory medicine, and you develop belly (abdominal) pain. SEEK IMMEDIATE MEDICAL CARE IF:   You have a sudden onset of chest pain or difficulty breathing.  You have a fever or persistent symptoms for more than 2-3 days.  You have a fever and your symptoms suddenly get worse. MAKE SURE YOU:  Understand these instructions.  Will watch your  condition.  Will get help right away if you are not doing well or get worse.   This information is not intended to replace advice given to you by your health care provider. Make sure you discuss any questions you have with your health care provider.   Document Released: 11/06/2001 Document Revised: 09/02/2013 Document Reviewed: 07/20/2013 Elsevier Interactive Patient Education 2016 [place].   Gout Gout is an inflammatory arthritis caused by a buildup of uric acid crystals in the joints. Uric acid is a chemical that is normally present in the blood. When the level of uric acid in the blood is too high it can form crystals that deposit in your joints and tissues. This causes joint redness, soreness, and swelling (inflammation). Repeat attacks are common. Over time, uric acid crystals can form into masses (tophi) near a joint, destroying bone and causing disfigurement. Gout is treatable and often preventable. CAUSES  The disease begins with elevated levels of uric acid in the blood. Uric acid is produced by your body when it breaks down a naturally found substance called purines. Certain foods you eat, such as meats and fish, contain high amounts of purines. Causes of an elevated uric acid level include:  Being passed down from parent to child (heredity).  Diseases that cause increased uric acid production (such as obesity, psoriasis, and certain cancers).  Excessive alcohol use.  Diet, especially diets rich in meat and seafood.  Medicines, including certain cancer-fighting medicines (chemotherapy), water pills (diuretics), and aspirin.  Chronic kidney disease. The kidneys are no longer able to remove uric acid well.  Problems with metabolism. Conditions strongly associated with gout include:  Obesity.  High blood pressure.  High cholesterol.  Diabetes. Not everyone with elevated uric acid levels gets gout. It is not understood why some people get gout and others do not.  Surgery, joint injury, and eating too much of certain foods are some of the factors that can lead to gout attacks. SYMPTOMS   An attack of gout comes on quickly. It causes intense pain with redness, swelling, and warmth in a joint.  Fever can occur.  Often, only one joint is involved. Certain joints are more commonly involved:  Base of the big toe.  Knee.  Ankle.  Wrist.  Finger. Without treatment, an attack usually goes away in a few days to weeks. Between attacks, you usually will not have symptoms, which is different from many other forms of arthritis. DIAGNOSIS  Your caregiver will suspect gout based on your symptoms and exam. In some cases, tests may be recommended. The tests may include:  Blood tests.  Urine tests.  X-rays.  Joint fluid exam. This exam requires a needle to remove fluid from the joint (arthrocentesis). Using a microscope, gout is confirmed when uric acid crystals are seen in the joint fluid. TREATMENT  There are two phases to gout treatment: treating the sudden onset (acute) attack and preventing attacks (prophylaxis).  Treatment of an Acute Attack.  Medicines are used. These include anti-inflammatory medicines or steroid medicines.  An injection of steroid medicine into the affected joint is  sometimes necessary.  The painful joint is rested. Movement can worsen the arthritis.  You may use warm or cold treatments on painful joints, depending which works best for you.  Treatment to Prevent Attacks.  If you suffer from frequent gout attacks, your caregiver may advise preventive medicine. These medicines are started after the acute attack subsides. These medicines either help your kidneys eliminate uric acid from your body or decrease your uric acid production. You may need to stay on these medicines for a very long time.  The early phase of treatment with preventive medicine can be associated with an increase in acute gout attacks. For this reason,  during the first few months of treatment, your caregiver may also advise you to take medicines usually used for acute gout treatment. Be sure you understand your caregiver's directions. Your caregiver may make several adjustments to your medicine dose before these medicines are effective.  Discuss dietary treatment with your caregiver or dietitian. Alcohol and drinks high in sugar and fructose and foods such as meat, poultry, and seafood can increase uric acid levels. Your caregiver or dietitian can advise you on drinks and foods that should be limited. HOME CARE INSTRUCTIONS   Do not take aspirin to relieve pain. This raises uric acid levels.  Only take over-the-counter or prescription medicines for pain, discomfort, or fever as directed by your caregiver.  Rest the joint as much as possible. When in bed, keep sheets and blankets off painful areas.  Keep the affected joint raised (elevated).  Apply warm or cold treatments to painful joints. Use of warm or cold treatments depends on which works best for you.  Use crutches if the painful joint is in your leg.  Drink enough fluids to keep your urine clear or pale yellow. This helps your body get rid of uric acid. Limit alcohol, sugary drinks, and fructose drinks.  Follow your dietary instructions. Pay careful attention to the amount of protein you eat. Your daily diet should emphasize fruits, vegetables, whole grains, and fat-free or low-fat milk products. Discuss the use of coffee, vitamin C, and cherries with your caregiver or dietitian. These may be helpful in lowering uric acid levels.  Maintain a healthy body weight. SEEK MEDICAL CARE IF:   You develop diarrhea, vomiting, or any side effects from medicines.  You do not feel better in 24 hours, or you are getting worse. SEEK IMMEDIATE MEDICAL CARE IF:   Your joint becomes suddenly more tender, and you have chills or a fever. MAKE SURE YOU:   Understand these instructions.  Will  watch your condition.  Will get help right away if you are not doing well or get worse.   This information is not intended to replace advice given to you by your health care provider. Make sure you discuss any questions you have with your health care provider.   Document Released: 11/09/2000 Document Revised: 12/03/2014 Document Reviewed: 06/25/2012 Elsevier Interactive Patient Education [employer].

## 2015-12-26 NOTE — Progress Notes (Signed)
Physical Therapy Treatment Patient Details Name: [name] MRN: [id number] DOB: November 14, 1956 Today's Date: 12/26/2015    History of Present Illness [name] is a 60 y.o. female with a past medical history of fibromyalgia, chronic fatigue syndrome, gastroparesis, restless leg syndrome, R TKA  who was diagnosed with a urinary tract infection about 10 days ago and was treated with Levaquin. Patient was seen by her primary care physician yesterday with complaints of low-grade fever, cough and was diagnosed with sinusitis.  Patient was brought in 12/22/15  by her husband due to worsening confusion, s/p fall at home    PT Comments    Pt with improved cognition and mobility today.  Pt feels comfortable with d/c plan for home at this time.  Discussed using RW vs rollator if R foot pain returns.  Pt reports her spouse plans to take off work a few days upon d/c, and she also has family/friends that can assist if needed.   Follow Up Recommendations  No PT follow up;Supervision - Intermittent     Equipment Recommendations  None recommended by PT    Recommendations for Other Services       Precautions / Restrictions Precautions Precautions: Fall    Mobility  Bed Mobility               General bed mobility comments: pt up in recliner on arrival  Transfers Overall transfer level: Needs assistance Equipment used: None Transfers: Sit to/from Stand Sit to Stand: Min guard         General transfer comment: min/guard for safety, no physical assist required  Ambulation/Gait Ambulation/Gait assistance: Min guard Ambulation Distance (Feet): 400 Feet Assistive device: Rolling walker (2 wheeled) Gait Pattern/deviations: Step-through pattern;Decreased stride length Gait velocity: decr   General Gait Details: preferred using RW for better confidence (has one at home), denies increased R foot pain   Stairs            Wheelchair Mobility    Modified Rankin (Stroke  Patients Only)       Balance                                    Cognition Arousal/Alertness: Awake/alert Behavior During Therapy: WFL for tasks assessed/performed Overall Cognitive Status: Within Functional Limits for tasks assessed                      Exercises      General Comments        Pertinent Vitals/Pain Pain Assessment: No/denies pain    Home Living                      Prior Function            PT Goals (current goals can now be found in the care plan section) Progress towards PT goals: Progressing toward goals    Frequency  Min 3X/week    PT Plan Current plan remains appropriate    Co-evaluation             End of Session   Activity Tolerance: Patient tolerated treatment well Patient left: in chair;with call bell/phone within reach     Time: 1050-1106 PT Time Calculation (min) (ACUTE ONLY): 16 min  Charges:  $Gait Training: 8-22 mins  G Codes:      Johanthan Kneeland,KATHrine E January 23, 2016, 12:47 PM Zenovia Jarred, PT, DPT 2016-01-23 Pager: [phone]

## 2015-12-26 NOTE — Progress Notes (Signed)
*  PRELIMINARY RESULTS* Vascular Ultrasound Right upper extremity venous duplex has been completed.  Preliminary findings: negative for DVT. Superficial thrombosis is noted in the right basilic vein at [facility] fossa.    Landry Mellow, RDMS, RVT  12/26/2015, 9:12 AM

## 2015-12-27 ENCOUNTER — Other Ambulatory Visit: Payer: Self-pay | Admitting: Cardiovascular Disease

## 2015-12-27 ENCOUNTER — Telehealth: Payer: Self-pay | Admitting: Family Medicine

## 2015-12-27 NOTE — Telephone Encounter (Signed)
Pt was called for TOC. Pt stated she was very tired. We discussed her meds and she seemed to have a good understanding of recent changes that the hospital made. She also has all her meds filled. She stated she does have a appt with pulmonary already made. She also made an appt with [place] for 01/02/2016. Pt was told to bring all medications with her and she verbalized understanding. After hours protocol was gone over with her and she was told if any non emergency issues came up to call the office. We discussed that if any issues occurred that sent her to hospital to go back to the er. Pt verbalized understanding.

## 2016-01-02 ENCOUNTER — Encounter: Payer: Self-pay | Admitting: Family Medicine

## 2016-01-02 ENCOUNTER — Ambulatory Visit (INDEPENDENT_AMBULATORY_CARE_PROVIDER_SITE_OTHER): Payer: 59 | Admitting: Family Medicine

## 2016-01-02 VITALS — BP 124/72 | HR 72 | Ht 62.75 in | Wt 215.0 lb

## 2016-01-02 DIAGNOSIS — G2581 Restless legs syndrome: Secondary | ICD-10-CM

## 2016-01-02 DIAGNOSIS — E785 Hyperlipidemia, unspecified: Secondary | ICD-10-CM | POA: Diagnosis not present

## 2016-01-02 DIAGNOSIS — M10071 Idiopathic gout, right ankle and foot: Secondary | ICD-10-CM

## 2016-01-02 DIAGNOSIS — M109 Gout, unspecified: Secondary | ICD-10-CM

## 2016-01-02 DIAGNOSIS — Z5181 Encounter for therapeutic drug level monitoring: Secondary | ICD-10-CM

## 2016-01-02 DIAGNOSIS — F411 Generalized anxiety disorder: Secondary | ICD-10-CM | POA: Diagnosis not present

## 2016-01-02 DIAGNOSIS — G43909 Migraine, unspecified, not intractable, without status migrainosus: Secondary | ICD-10-CM

## 2016-01-02 DIAGNOSIS — M797 Fibromyalgia: Secondary | ICD-10-CM | POA: Diagnosis not present

## 2016-01-02 DIAGNOSIS — G4733 Obstructive sleep apnea (adult) (pediatric): Secondary | ICD-10-CM

## 2016-01-02 LAB — BASIC METABOLIC PANEL
BUN: 19 mg/dL (ref 7–25)
CO2: 26 mmol/L (ref 20–31)
Calcium: 9 mg/dL (ref 8.6–10.4)
Chloride: 106 mmol/L (ref 98–110)
Creat: 0.68 mg/dL (ref 0.50–1.05)
Glucose, Bld: 77 mg/dL (ref 65–99)
Potassium: 4.3 mmol/L (ref 3.5–5.3)
Sodium: 141 mmol/L (ref 135–146)

## 2016-01-02 LAB — URIC ACID: Uric Acid, Serum: 6 mg/dL (ref 2.4–7.0)

## 2016-01-02 NOTE — Patient Instructions (Signed)
   We are checking your uric acid level today.  If it remains elevated (hoping it is lower since stopping the diuretic), then we will start a uric acid lowering medication (allopurinol).  You can STOP taking the colchicine, and instead use it AS NEEDED for gout flare (taking 2 at the onset of gout pain, and then one twice daily until pain resolves. If you are started on allopurinol, overlap it with the daily colchicine for a week.  Your discharge instructions were unclear regarding your metoprolol.  The original plan was actually to increase the metoprolol dose to 75mg , but since your blood pressure is fine today, there is no need. Continue the 50mg  twice daily.  Restart the atorvastatin.  Follow up with Dr. Estanislado Pandy sooner than your next scheduled appointment if you pain is getting much worse, to decide if/which medications should be restarted.

## 2016-01-02 NOTE — Progress Notes (Signed)
Chief Complaint  Patient presents with  . Hospitalization Follow-up    nonfasting, brought all meds in with her. Told that she has gout at hospital and was given colchicine, wants to know if you will recheck for it today.    She was hospitalized 1/26-1/30 with acute encephalopathy.  There was concern for possible serotonin syndrome due to her multiple medications.  She developed gout (right great toe) and a superficial thrombophlebitis in her R arm while in the hospital. She used NSAIDs and heat, and the thrombophlebitis resolved.  She was put on Colchicine daily for the gout, and Dyazide was stopped. It sounds as though they meant to increase the metoprolol to 75mg , but the discharge instructions were very unclear.  Still taking 50mg  BID.  This is what patient instructions said: metoprolol 75 MG TABS Take 50 mg by mouth 2 (two) times daily. Qty: 60 tablet, Refills: 0         She also brought in her long packet of discharge instructions, and what she received is NOT as clear as what is in the discharge summary in the chart (which clearly stated which meds to continue, which to stop, which have changed, etc).    While in the hospital, her CK level was normal, her psychotropic medications were held. She was seen by neurology. MRI brain was a limited study due to motion. No obvious stroke was identified. Ammonia level was normal. B-12 level is 752. TSH was 1.79. Urine drug screen was positive for benzodiazepines only. No obvious infectious source has been identified. C. difficile was negative. EEG does not show any epileptiform activity. Diffuse slowing was noted. Urine was normal (recently had been treated with levaquin for UTI, which resolved). Patient's mental status slowly improved during hospitalization, and was back to baseline at discharge. Etiology remains unclear. Her Cymbalta was resumed at a lower dose until seen by her psych for follow-up.   Head CT 12/22/2015 CLINICAL DATA: Altered  mental status. EXAM: CT HEAD WITHOUT CONTRAST TECHNIQUE: Contiguous axial images were obtained from the base of the skull through the vertex without intravenous contrast. COMPARISON: None available currently. FINDINGS: Bony calvarium is unremarkable. Minimal diffuse cortical atrophy is noted. No mass effect or midline shift is noted. Ventricular size is within normal limits. There is no evidence of mass lesion, hemorrhage or acute infarction. IMPRESSION: Minimal diffuse cortical atrophy. No acute intracranial abnormality seen.  MRI: 12/22/2015 CLINICAL DATA: Initial evaluation for acute encephalopathy. EXAM: MRI HEAD WITHOUT CONTRAST TECHNIQUE: Multiplanar, multiecho pulse sequences of the brain and surrounding structures were obtained without intravenous contrast. COMPARISON: Prior CT from earlier the same day. FINDINGS: Study is severely limited as only a sagittal T1 weighted sequence was obtained. Patient was unable to tolerate full length of exam. Limited sagittal view of the brain demonstrates no definite acute abnormality. Midline structures intact. Pituitary gland normal. No definite mass lesion or mass effect. Study inadequate to evaluate for infarct or other acute abnormality. Mild cerebellar tonsillar ectopia of 5 mm without frank Chiari malformation. Visualized upper cervical spine normal. Bilateral sphenoid sinus disease noted. Orbits grossly normal. Bone marrow signal intensity within normal limits. No scalp soft tissue abnormality. IMPRESSION: 1. Markedly limited study with only sagittal T1 weighted sequence performed. Patient was unable to tolerate full length of exam. 2. No definite acute abnormality. 3. Cerebellar tonsillar ectopia of 5 mm without frank Chiari malformation. 4. Bilateral sphenoid sinus disease.  CXR-normal  She saw Dr. Caprice Beaver on Thursday, who didn't think her medications could  cause her symptoms. Her Cymbalta dose was increased back to her prior dose (60mg  BID).  They are  holding the Abilify for now, to see if she can do without it.  She stopped the lyrica, topamax, klonopin. Discharge instructions also said to stop the nortriptylene, but she continued this one. She stopped the lipitor (which said okay to continue, but it doesn't appear that she was given the same information, so stopped it).  Hasn't been sleeping too well, but slept a lot yesterday and last night. Restless legs haven't been too bad, despite stopping many of the medications.  She has noted some more headaches since stopping the topamax. She has been having some back pain.  Cough is improving.  She has been using the tessalon prescribed by the hospital. No fever or chills, shortness of breath. She has started having some left leg pain, slight swelling.  Denies being immobile  PMH,PSH, SH reviewed.  Outpatient Encounter Prescriptions as of 01/02/2016  Medication Sig Note  . ALPRAZolam (XANAX) 0.25 MG tablet Take 0.25 mg by mouth 3 (three) times daily as needed for anxiety.  12/01/2015: Up to 3x/week  . benzonatate (TESSALON) 100 MG capsule Take 1 capsule (100 mg total) by mouth 3 (three) times daily. 01/02/2016: Still using; cough improving  . colchicine 0.6 MG tablet Take 1 tablet (0.6 mg total) by mouth daily.   . DULoxetine (CYMBALTA) 30 MG capsule Take 1 capsule (30 mg total) by mouth 2 (two) times daily. (Patient taking differently: Take 60 mg by mouth 2 (two) times daily. ) 01/02/2016: Increased back to 60mg  BID by Dr. Caprice Beaver  . metoprolol (LOPRESSOR) 50 MG tablet Take 50 mg by mouth 2 (two) times daily.   . nortriptyline (PAMELOR) 50 MG capsule Take 150 mg by mouth at bedtime.   . ondansetron (ZOFRAN) 8 MG tablet Take 8 mg by mouth every 8 (eight) hours as needed for nausea or vomiting. Reported on 12/12/2015   . pantoprazole (PROTONIX) 40 MG tablet Take 40 mg by mouth 2 (two) times daily.   . Probiotic Product (Bucoda) CAPS Take 1 capsule by mouth daily. Reported on 01/02/2016   .  prochlorperazine (COMPAZINE) 10 MG tablet Take 10 mg by mouth every 6 (six) hours as needed for nausea or vomiting.   . [DISCONTINUED] metoprolol 75 MG TABS Take 50 mg by mouth 2 (two) times daily.   Marland Kitchen acetaminophen (TYLENOL) 500 MG tablet Take 1,000 mg by mouth every 6 (six) hours as needed for mild pain. Reported on 01/02/2016   . albuterol (PROVENTIL HFA;VENTOLIN HFA) 108 (90 Base) MCG/ACT inhaler Inhale into the lungs every 6 (six) hours as needed for wheezing or shortness of breath. Reported on 01/02/2016   . atorvastatin (LIPITOR) 40 MG tablet TAKE 1 TABLET (40 MG TOTAL) BY MOUTH DAILY. (Patient not taking: Reported on 01/02/2016) 01/02/2016: This was stopped during hospitalization  . cetirizine (ZYRTEC) 10 MG tablet Take 10 mg by mouth at bedtime. Reported on 01/02/2016   . estradiol (VIVELLE-DOT) 0.05 MG/24HR patch Reported on 01/02/2016 01/02/2016: Stopped during hospitalization; hot flashes are tolerable so far  . fluticasone (FLONASE) 50 MCG/ACT nasal spray Place 2 sprays into both nostrils daily. (Patient not taking: Reported on 01/02/2016)   . meloxicam (MOBIC) 15 MG tablet Take 15 mg by mouth daily. Reported on 01/02/2016 12/12/2015: Received from: External Pharmacy  . methocarbamol (ROBAXIN) 500 MG tablet Take 1 tablet (500 mg total) by mouth every 6 (six) hours as needed for muscle spasms. (Patient  not taking: Reported on 01/02/2016) 12/01/2015: Uses prn, not daily  . Multiple Vitamins-Minerals (PRESERVISION AREDS PO) Take 1 capsule by mouth daily. Reported on 01/02/2016 12/22/2015: Pt's personal med lists an "I Caps" supplement.    . progesterone (PROMETRIUM) 100 MG capsule Take 100 mg by mouth at bedtime. Reported on 01/02/2016   . promethazine (PHENERGAN) 25 MG suppository Place 25 mg rectally every 6 (six) hours as needed for nausea or vomiting. Reported on 01/02/2016   . SUMAtriptan (IMITREX) 50 MG tablet Take 50 mg by mouth every 2 (two) hours as needed for migraine. Reported on 01/02/2016   . [DISCONTINUED]  propranolol (INDERAL) 20 MG tablet Take 40 mg by mouth at bedtime. Reported on 01/02/2016    No facility-administered encounter medications on file as of 01/02/2016.   Allergies  Allergen Reactions  . Erythromycin Nausea Only  . Tramadol Itching  . Dilaudid [Hydromorphone Hcl] Itching   ROS: no fever, chills. +headaches per HPI.  No URI symptoms--resolving, only slight residual cough. No chest pain, shortness of breath.  +LLE discomfort, some back pain, but pain not too terrible.  No numbness, tingling, or other neuro symptoms.  Memory is fine. No urinary symptoms or GI complaints, other than her baseline related to gastroparesis.  Has been eating okay.   PHYSICAL EXAM: BP 124/72 mmHg  Pulse 72  Ht 5' 2.75" (1.594 m)  Wt 215 lb (97.523 kg)  BMI 38.38 kg/m2  LMP 07/27/2006  Pleasant, alert female in no distress HEENT: PERRL, EOMI, conjunctiva and sclera are clear.  Nasal mucosa is normal, OP is clear, sinuses nontender Neck: no lymphadenopathy, thyromegaly or bruit Heart: regular rate and rhythm Lungs: clear bilaterally Abdomen: minimally tender in epigastrium, otherwise normal Extremities: Trace edema on left. Negative homan. Mildly tender over calf. No cords Psych: normal mood, affect, hygiene and grooming Neuro: alert and oriented. Normal strength, gait, cranial nerves  ASSESSMENT/PLAN:  Acute gout of right foot, unspecified cause - elevated uric acid in hospital. Now off dyazide, recheck uric acid to determine if add'l treatment is needed - Plan: Uric acid  Medication monitoring encounter - Plan: Basic metabolic panel  Anxiety state - stable. Per Dr. Caprice Beaver. holding abilify, and continuing cymbalta at previous dose. Nortriptylene also being held  Hyperlipidemia - restart statin  Restless leg syndrome - so far, not significantly worse since stopping klonopin and other meds  OSA (obstructive sleep apnea) - continue CPAP  Migraine without status migrainosus, not  intractable, unspecified migraine type - more frequent headaches since nortripytlene, lyrica and topamax were stopped. f/u with neuro re: meds  Fibromyalgia - does not appear to be significantly worse since stopping Lyrica and other meds.  Continue Cymbalta    Off dyazide now for a week. Recheck uric acid.  If still elevated, start allopurinol in place of colchicine.   We are checking your uric acid level today.  If it remains elevated (hoping it is lower since stopping the diuretic), then we will start a uric acid lowering medication (allopurinol).  You can STOP taking the colchicine, and instead use it AS NEEDED for gout flare (taking 2 at the onset of gout pain, and then one twice daily until pain resolves. If you are started on allopurinol, overlap it with the daily colchicine for a week.  Your discharge instructions were unclear regarding your metoprolol.  The original plan was actually to increase the metoprolol dose to 75mg , but since your blood pressure is fine today, there is no need. Continue the 50mg   twice daily.  Restart the atorvastatin.  Follow up with Dr. Estanislado Pandy sooner than your next scheduled appointment if you pain is getting much worse, to decide if/which medications should be restarted.

## 2016-01-09 ENCOUNTER — Other Ambulatory Visit: Payer: Self-pay | Admitting: Cardiovascular Disease

## 2016-01-09 DIAGNOSIS — R2689 Other abnormalities of gait and mobility: Secondary | ICD-10-CM | POA: Insufficient documentation

## 2016-01-09 MED ORDER — METOPROLOL TARTRATE 50 MG PO TABS: 50.0000 mg | ORAL_TABLET | Freq: Two times a day (BID) | ORAL | Status: DC

## 2016-01-16 ENCOUNTER — Institutional Professional Consult (permissible substitution): Payer: 59 | Admitting: Pulmonary Disease

## 2016-01-18 ENCOUNTER — Telehealth: Payer: Self-pay | Admitting: Family Medicine

## 2016-01-18 NOTE — Telephone Encounter (Signed)
Patient is really confused. Said that she called Dr.Mckinney's office and Dr.Mckinney said it was okay to start back but that they wanted her to double check with you. She said she would be really appreciative if you could call Dr.Mckinney to clarify. Number is [phone].

## 2016-01-18 NOTE — Telephone Encounter (Signed)
Please call   She was in hospital and was taken off all her meds. She states Dr. Caprice Beaver prescribes aripiprazole 5 mg, and that Dr Caprice Beaver  told her to contact you About going back on it and see if you were ok with her starting it back

## 2016-01-18 NOTE — Telephone Encounter (Signed)
Hospitalization was last month, not recent (unless she was in again?). We discussed her meds at her f/u visit on 2/6, and at that point she had already seen Dr. Caprice Beaver.  Pt told me they were "holding abilify for now, to see if she can do without it". I leave it up to her psych to know if it is needed. If she needs me to call Dr. Caprice Beaver to clarify things, please get me her number so I can call Dr. Caprice Beaver.

## 2016-01-25 ENCOUNTER — Ambulatory Visit (INDEPENDENT_AMBULATORY_CARE_PROVIDER_SITE_OTHER): Payer: 59 | Admitting: Pulmonary Disease

## 2016-01-25 ENCOUNTER — Encounter: Payer: Self-pay | Admitting: Pulmonary Disease

## 2016-01-25 VITALS — BP 132/80 | HR 66 | Ht 62.5 in | Wt 208.0 lb

## 2016-01-25 DIAGNOSIS — R0600 Dyspnea, unspecified: Secondary | ICD-10-CM

## 2016-01-25 DIAGNOSIS — G4733 Obstructive sleep apnea (adult) (pediatric): Secondary | ICD-10-CM

## 2016-01-25 DIAGNOSIS — K3184 Gastroparesis: Secondary | ICD-10-CM

## 2016-01-25 DIAGNOSIS — J387 Other diseases of larynx: Secondary | ICD-10-CM

## 2016-01-25 DIAGNOSIS — IMO0001 Reserved for inherently not codable concepts without codable children: Secondary | ICD-10-CM

## 2016-01-25 DIAGNOSIS — Z8709 Personal history of other diseases of the respiratory system: Secondary | ICD-10-CM

## 2016-01-25 DIAGNOSIS — K219 Gastro-esophageal reflux disease without esophagitis: Secondary | ICD-10-CM

## 2016-01-25 NOTE — Progress Notes (Signed)
Subjective:     Patient ID: [name], female   DOB: July 09, 1956, 60 y.o.   MRN: [id number]  HPI ~  January 25, 2016:  Initial pulmonary consult by SN>  She is the sister of [name] who works in our allergy clinic...    60 y/o WF prev eval by PW in 2013 w/ SOB/ breathing issues (GERD/ LPR w/ ?microaspiration) & currently followed by PCP- Dr.[name];  Pt has no other hx of lung dis but sister [name] has Asthma;  [name] is obese (wt=208# in a 5'2" frame for a BMI=38);  She has known OSA on CPAP- prev eval & rx by [name] but this was yrs ago, pt not currently using it regularly, states she does better when using it regularly, and notes "I have CFS also"... Disa gives a quite rambling hx-- states several months of SOB/ DOE, CP; eval at Urgent Care &  "no blood clots and the ruled out my heart";  Given inhaler (no benefit) & late Pred (this helped);  Then end of [id number]- ER eval, MRI, Neurology & Psyche called, ?withdrawals, adm ICU [facility], stopped all meds, disch w/ home PT/OT...  She notes breathing is better now- prev describes "couldn't get my air IN", "couldn't get a deep breath" "didn't feel satisfied breathing";  She notes some cough, occas wheezing, no sput, no hemoptysis, some sharp CP/ tenderness over sternum...  Smoking Hx>  She is a NEVER smoker...  Pulmonary Hx>   She has hx OSA w/ initial Sleep eval by [name], on CPAP x yrs she says; had pneumonia x1 in 2012; no hx asthma, recurrent bronchitic infections, no hx TB or known exposure...    Cardiac Hx>  Cards eval by [name], last seen  2DEcho 07/2015 (done for dyspnea) showed   Medical Hx>  PCP=[name] on Atorva for Chol;  She has a hx gastroparesis w/ eval [facility]- [name] on Protonix40Bid, Compazine, Zofran;  Hx anxiety/ depression prev Dr [name] w/ Abilify, Nortrip, Cymbalta, Alprazolam...  Family Hx>  + HBP, +DM in father, heart dis, stroke; sister has asthma, IBS...  Occup Hx>  [employer], married, 2 children   Current Meds>  Flonase, Proair, Metoprolol, Lipitor, Protonix, Zofran, Compazine, Xanax, Pamelor, Cymbalta EXAM shows Afeb, VSS, O2sat=96% on RA;  208#, 5'3"Tall, BMI=37;  HEENT- neg, mallampati4, short/fat neck;  Chest- decr excursions, clear w/o w/r/r;  Cor- RR w/o m/r/g;  Abd- obese, soft, +pannuiculus;  Ext- neg w/o c/c/e;  Neuro- intact.   2DEcho 08/23/15 showed mod focal basal hypertrophy, norm LVF w/ EF=60-65%, no regional wall motion abn, Gr1DD, lipomatous hypertrophy of the atrial septum, valves ok, RV ok   EKG 12/22/15 showed NSR, rate100, rightward axis, low voltage, otherw wnl...  Last CXR 12/23/16>  Norm heart size, mild chr peribronchial thickening, clear- NAD, obese soft tissues...   CT Angio Chest 11/27/15 via ER> NEG for PE, vessels are ok, clear lungs w/ mild basilar atx, no adenopathy, s/pGB, pancreas & adrenals ok, old fx betw manubrium & body of the sternum,   Spirometry 01/25/16>  FVC=2.29 (78%), FEV1=1.92 (81%), %1sec=84, mid-flows wnl at 87% predicted;  This is c/w borderline restriction (need lung volume measurement for confirmation)...   Ambulatory Oximetry 01/25/16> O2sat=97% on RA at rest w/ HR=65/min; she ambulated 3 Laps (185'ea) w/ lowest O2sat=96% w/ HR=89/min...  LABS Jan-Feb2017 in Epic>  Chems- wnl x Alb=3.3;  CBC- mild anemia w/ Hg=11.6, mcv=88;  TSH=1.88  IMP>>    Dyspnea-- this is multifactorial w/ OSA, pulm  restriction from obesity, deconditioning, & "chest wall factors" all playing a roll.    Hx aspiration pneumonia    Known GERD/Gastroparesis & ? LPR-- she needs a vigorous antireflux regimen w/ Protonix40Bid, NPO after dinner, elev HOB 6", etc...    OSA-- we do not have any old data, therefore needs re-assessment & restart CPAP based on her current situation.    OBESITY-- obviously DIET & EXERCISE are the keys, but due to her health status she should consider Bariatric surg.    Hx RLS-- I assume this was a finding on her previous sleep eval, not on specific rx  for this at present, ?followed by Neuro?    Medical issues-- HL, DJD w/ R-TKR in 2016, Gout, FM/CFS, Migraines, ?Neuropathy, Anxiety/Depression;  Her PCP=DrEveKnapp, Neuro= [facility] DrLTonuzi PLAN>>      [name] presents w/ multifactorial dyspnea and I explained that there is no easy/ quick fix;  The good news is that herCXR, PFT & oxygenaton are normal indicating that her lungs are healthy;  To help her w/ the dyspnea she needs to do the following:  1)  Must lose weight- diet & exercise are the keys but Bariatric surg would be a reasonable approach if she has been unable to successfully lose wt in the pat;  2) she must improve her conditioning- this requires a regular exercise program & she cannot do this on her own, needs help of family/ trainer/ etc & suggested to join the Y/ [employer]/ etc;  3)  For the chest wall factor-- I find that Alprazolam/ Klonopin are the best meds to allow a deeper breath & help the pt feel more satisfied breathing, & it may take a dose adjustment to help maximally;  4)  Must follow a vigorous antireflux regimen-- Protonix40Bid, NPO after dinner, elevate HOB 6"...    Past Medical History  Diagnosis Date  . Fibromyalgia   . GERD (gastroesophageal reflux disease)   . Obstructive sleep apnea     On Cpap 2009  . Anxiety   . Depression   . Restless leg syndrome   . Edema   . Migraine   . Hyperlipidemia   . Anemia     previously followed by Dr. Jamse Arn for anemia and elevated platelets  . Paresthesia     [facility]. [facility]rette [facility]  . Tremor     Dr. Everette Rank  . Gastroparesis     followed at [facility]  . Pneumonia     2012  . Plantar fasciitis 02/2011    R foot  . S/P endometrial ablation 08/09/2006    Novasure Ablation  . C. difficile colitis 10/01/12    treated by [facility]  . Dyssynergia     dyssynergenic defecation, contributing to fecal incontinence.  . Kidney stone   . Pelvic floor dysfunction     pelvic floor dyssynergy  . Neuropathy  ([place])   . DDD (degenerative disc disease), lumbar 08/19/14    and facet arthroplasty & left lumbar radiculopathy (Dr.Ramos)  . S/P epidural steroid injection 09/20/14    Dr.Ramos  . PONV (postoperative nausea and vomiting)     pt states has gastroparesis has difficulty taking antibiotics and narcotics has severe nausea and vomiting   . Shortness of breath dyspnea     bending over; exertion   . Urinary frequency   . Urinary incontinence   . Chronic fatigue syndrome   . Lumbar radiculopathy     Past Surgical History  Procedure Laterality Date  .  Tonsillectomy  1968  . Knee surgery  1999    R knee, Dr. Eddie Dibbles, torn cartilage  . Cholecystectomy  9/05  . Endometrial ablation  08/09/2006    Dr. Valentina Shaggy Ablation  . Tonsillectomy    . Total knee arthroplasty Right 09/05/2015    Procedure: RIGHT TOTAL KNEE ARTHROPLASTY;  Surgeon: Gaynelle Arabian, MD;  Location: [facility];  Service: Orthopedics;  Laterality: Right;    Outpatient Encounter Prescriptions as of 01/25/2016  Medication Sig  . acetaminophen (TYLENOL) 500 MG tablet Take 1,000 mg by mouth every 6 (six) hours as needed for mild pain. Reported on 01/02/2016  . albuterol (PROVENTIL HFA;VENTOLIN HFA) 108 (90 Base) MCG/ACT inhaler Inhale into the lungs every 6 (six) hours as needed for wheezing or shortness of breath. Reported on 01/02/2016  . ALPRAZolam (XANAX) 0.25 MG tablet Take 0.25 mg by mouth 3 (three) times daily as needed for anxiety.   Marland Kitchen atorvastatin (LIPITOR) 40 MG tablet TAKE 1 TABLET (40 MG TOTAL) BY MOUTH DAILY.  . cholecalciferol (VITAMIN D) 1000 units tablet Take 1,000 Units by mouth daily.  . DULoxetine (CYMBALTA) 30 MG capsule Take 1 capsule (30 mg total) by mouth 2 (two) times daily. (Patient taking differently: Take 60 mg by mouth 2 (two) times daily. )  . fluticasone (FLONASE) 50 MCG/ACT nasal spray Place 2 sprays into both nostrils daily.  . metoprolol (LOPRESSOR) 50 MG tablet Take 1 tablet (50 mg total) by mouth 2  (two) times daily.  . Multiple Vitamins-Minerals (PRESERVISION AREDS PO) Take 1 capsule by mouth daily. Reported on 01/02/2016  . nortriptyline (PAMELOR) 50 MG capsule Take 150 mg by mouth at bedtime.  . ondansetron (ZOFRAN) 8 MG tablet Take 8 mg by mouth every 8 (eight) hours as needed for nausea or vomiting. Reported on 12/12/2015  . pantoprazole (PROTONIX) 40 MG tablet Take 40 mg by mouth 2 (two) times daily.  . Probiotic Product ([place]) CAPS Take 1 capsule by mouth daily. Reported on 01/02/2016  . prochlorperazine (COMPAZINE) 10 MG tablet Take 10 mg by mouth every 6 (six) hours as needed for nausea or vomiting.  . promethazine (PHENERGAN) 25 MG suppository Place 25 mg rectally every 6 (six) hours as needed for nausea or vomiting. Reported on 01/02/2016  . SUMAtriptan (IMITREX) 50 MG tablet Take 50 mg by mouth every 2 (two) hours as needed for migraine. Reported on 01/02/2016  . [DISCONTINUED] benzonatate (TESSALON) 100 MG capsule Take 1 capsule (100 mg total) by mouth 3 (three) times daily.  . [DISCONTINUED] cetirizine (ZYRTEC) 10 MG tablet Take 10 mg by mouth at bedtime. Reported on 01/02/2016  . [DISCONTINUED] colchicine 0.6 MG tablet Take 1 tablet (0.6 mg total) by mouth daily.  . [DISCONTINUED] estradiol (VIVELLE-DOT) 0.05 MG/24HR patch Reported on 01/02/2016  . [DISCONTINUED] meloxicam (MOBIC) 15 MG tablet Take 15 mg by mouth daily. Reported on 01/02/2016  . [DISCONTINUED] methocarbamol (ROBAXIN) 500 MG tablet Take 1 tablet (500 mg total) by mouth every 6 (six) hours as needed for muscle spasms. (Patient not taking: Reported on 01/02/2016)  . [DISCONTINUED] progesterone (PROMETRIUM) 100 MG capsule Take 100 mg by mouth at bedtime. Reported on 01/02/2016   No facility-administered encounter medications on file as of 01/25/2016.    Allergies  Allergen Reactions  . Erythromycin Nausea Only  . Tramadol Itching  . Dilaudid [Hydromorphone Hcl] Itching    Family History  Problem Relation Age  of Onset  . Allergies Mother   . Hypertension Mother   . Heart  disease Mother     possible valve problem  . Heart disease Father     pacemaker, CHF  . Hypertension Father   . Diabetes Father     borderline  . Stroke Father 44  . Kidney disease Father   . Heart disease Paternal Grandmother   . Heart disease Paternal Grandfather   . Asthma Sister   . Irritable bowel syndrome Sister   . Allergies Sister   . Cancer Maternal Aunt     leukemia  . Cancer Maternal Aunt   . Colon cancer Maternal Aunt     late 5's    Social History   Social History  . Marital Status: Married    Spouse Name: N/A  . Number of Children: 2  . Years of Education: N/A   Occupational History  . customer service (on disability) [employer]   Social History Main Topics  . Smoking status: Never Smoker   . Smokeless tobacco: Never Used  . Alcohol Use: No  . Drug Use: No  . Sexual Activity: Not on file     Comment: 1st intercourse 60 yo-Fewer than 5 partners   Other Topics Concern  . Not on file   Social History Narrative   Married, 1 dog. 1 son in [place], 1 stepson in [place], with 2 children    Current Medications, Allergies, Past Medical History, Past Surgical History, Family History, and Social History were reviewed in [employer] record.   Review of Systems             All symptoms NEG except where BOLDED >>  Constitutional:  F/C/S, fatigue, anorexia, unexpected weight change. HEENT:  HA, visual changes, hearing loss, earache, nasal symptoms, sore throat, mouth sores, hoarseness. Resp:  cough, sputum, hemoptysis; SOB, tightness, wheezing. Cardio:  CP, palpit, DOE, orthopnea, edema. GI:  N/V/D/C, blood in stool; reflux, abd pain, distention, gas. GU:  dysuria, freq, urgency, hematuria, flank pain, voiding difficulty. MS:  joint pain, swelling, tenderness, decr ROM; neck pain, back pain, etc. Neuro:  HA, tremors, seizures, dizziness, syncope, weakness, numbness,  gait abn. Skin:  suspicious lesions or skin rash. Heme:  adenopathy, bruising, bleeding. Psyche:  confusion, agitation, sleep disturbance, hallucinations, anxiety, depression suicidal.   Objective:   Physical Exam       Vital Signs:  Reviewed...   General:  WD, obese, 60 y/o WM in NAD; alert & oriented; pleasant & cooperative... HEENT:  /AT; Conjunctiva- pink, Sclera- nonicteric, EOM-wnl, PERRLA; EACs-clear, TMs-wnl; NOSE-clear; THROAT-clear & wnl.  Neck:  Supple w/ fair ROM; no JVD; normal carotid impulses w/o bruits; no thyromegaly or nodules palpated; no lymphadenopathy.  Chest:  Decr BS at bases, clear to P & A; without wheezes, rales, or rhonchi heard. Heart:  Regular Rhythm; norm S1 & S2 without murmurs, rubs, or gallops detected. Abdomen:  Obese, soft & nontender- no guarding or rebound; normal bowel sounds; no organomegaly or masses palpated. Ext:  Normal ROM; without deformities or arthritic changes; no varicose veins, +venous insuffic, tr edema;  Pulses intact w/o bruits. Neuro:  CNs II-XII intact; motor testing normal; sensory testing normal; gait normal & balance OK. Derm:  No lesions noted; no rash etc. Lymph:  No cervical, supraclavicular, axillary, or inguinal adenopathy palpated.  Assessment:      IMP>>    Dyspnea-- this is multifactorial w/ OSA, pulm restriction from obesity, deconditioning, & "chest wall factors" all playing a roll.    Hx aspiration pneumonia    Known GERD/Gastroparesis & ?  LPR-- she needs a vigorous antireflux regimen w/ Protonix40Bid, NPO after dinner, elev HOB 6", etc...    OSA-- we do not have any old data, therefore needs re-assessment & restart CPAP based on her current situation.    OBESITY-- obviously DIET & EXERCISE are the keys, but due to her health status she should consider Bariatric surg.    Hx RLS-- I assume this was a finding on her previous sleep eval, not on specific rx for this at present, ?followed by Neuro?    Medical issues--  HL, DJD w/ R-TKR in 2016, Gout, FM/CFS, Migraines, ?Neuropathy, Anxiety/Depression;  Her PCP=DrEveKnapp, Neuro= [facility] DrLTonuzi  PLAN>>      [name] presents w/ multifactorial dyspnea and I explained that there is no easy/ quick fix;  The good news is that herCXR, PFT & oxygenaton are normal indicating that her lungs are healthy;  To help her w/ the dyspnea she needs to do the following:  1)  Must lose weight- diet & exercise are the keys but Bariatric surg would be a reasonable approach if she has been unable to successfully lose wt in the pat;  2) she must improve her conditioning- this requires a regular exercise program & she cannot do this on her own, needs help of family/ trainer/ etc & suggested to join the Y/ silver smeakers/ etc;  3)  For the chest wall factor-- I find that Alprazolam/ Klonopin are the best meds to allow a deeper breath & help the pt feel more satisfied breathing, & it may take a dose adjustment to help maximally;  4)  Must follow a vigorous antireflux regimen-- Protonix40Bid, NPO after dinner, elevate HOB 6"...   Plan:     Patient's Medications  New Prescriptions   No medications on file  Previous Medications   ACETAMINOPHEN (TYLENOL) 500 MG TABLET    Take 1,000 mg by mouth every 6 (six) hours as needed for mild pain. Reported on 01/02/2016   ALBUTEROL (PROVENTIL HFA;VENTOLIN HFA) 108 (90 BASE) MCG/ACT INHALER    Inhale into the lungs every 6 (six) hours as needed for wheezing or shortness of breath. Reported on 01/02/2016   ALPRAZOLAM (XANAX) 0.25 MG TABLET    Take 0.25 mg by mouth 3 (three) times daily as needed for anxiety.    ATORVASTATIN (LIPITOR) 40 MG TABLET    TAKE 1 TABLET (40 MG TOTAL) BY MOUTH DAILY.   CHOLECALCIFEROL (VITAMIN D) 1000 UNITS TABLET    Take 1,000 Units by mouth daily.   DULOXETINE (CYMBALTA) 30 MG CAPSULE    Take 1 capsule (30 mg total) by mouth 2 (two) times daily.   FLUTICASONE (FLONASE) 50 MCG/ACT NASAL SPRAY    Place 2 sprays into  both nostrils daily.   METOPROLOL (LOPRESSOR) 50 MG TABLET    Take 1 tablet (50 mg total) by mouth 2 (two) times daily.   MULTIPLE VITAMINS-MINERALS (PRESERVISION AREDS PO)    Take 1 capsule by mouth daily. Reported on 01/02/2016   NORTRIPTYLINE (PAMELOR) 50 MG CAPSULE    Take 150 mg by mouth at bedtime.   ONDANSETRON (ZOFRAN) 8 MG TABLET    Take 8 mg by mouth every 8 (eight) hours as needed for nausea or vomiting. Reported on 12/12/2015   PANTOPRAZOLE (PROTONIX) 40 MG TABLET    Take 40 mg by mouth 2 (two) times daily.   PROBIOTIC PRODUCT ([place] HEALTH) CAPS    Take 1 capsule by mouth daily. Reported on 01/02/2016   PROCHLORPERAZINE (COMPAZINE) 10  MG TABLET    Take 10 mg by mouth every 6 (six) hours as needed for nausea or vomiting.   PROMETHAZINE (PHENERGAN) 25 MG SUPPOSITORY    Place 25 mg rectally every 6 (six) hours as needed for nausea or vomiting. Reported on 01/02/2016   SUMATRIPTAN (IMITREX) 50 MG TABLET    Take 50 mg by mouth every 2 (two) hours as needed for migraine. Reported on 01/02/2016  Modified Medications   No medications on file  Discontinued Medications   BENZONATATE (TESSALON) 100 MG CAPSULE    Take 1 capsule (100 mg total) by mouth 3 (three) times daily.   CETIRIZINE (ZYRTEC) 10 MG TABLET    Take 10 mg by mouth at bedtime. Reported on 01/02/2016   COLCHICINE 0.6 MG TABLET    Take 1 tablet (0.6 mg total) by mouth daily.   ESTRADIOL (VIVELLE-DOT) 0.05 MG/24HR PATCH    Reported on 01/02/2016   MELOXICAM (MOBIC) 15 MG TABLET    Take 15 mg by mouth daily. Reported on 01/02/2016   METHOCARBAMOL (ROBAXIN) 500 MG TABLET    Take 1 tablet (500 mg total) by mouth every 6 (six) hours as needed for muscle spasms.   PROGESTERONE (PROMETRIUM) 100 MG CAPSULE    Take 100 mg by mouth at bedtime. Reported on 01/02/2016

## 2016-01-25 NOTE — Patient Instructions (Signed)
[name]-- it was great meeting you today...  We reviewed your data available in the Epic computer system (including your last CXR Jan2017)    and we checked your spirometry breathing test and an ambulatory oxygen test...  Your lungs appear to be OK w/ some mild restriction related to your weight & some chest wall muscle spasm as we discussed...    Let's get on track w/ our diet & exercise program- the goal is to lose 20 lbs...    You may use your ALPRAZOLAM 0.25mg  tabs- one tab up to 3 times daily as needed per our discussion...  Continue your CPAP and ask your doctor to check a 'CPAP download" to document that it is working well for you...  Finally we reviewed a vigorous antireflux regimen to preventy aspiration of stomach contents into your lungs>    Take the PROTONIX 40mg  about 30 min before breakfast & dinner...    Do not eat or drink much after dinner in the evening...    Elevate the head of your bed on 6" blocks...  Call for any questions or if we can be of service in any way.Marland KitchenMarland Kitchen

## 2016-02-01 ENCOUNTER — Telehealth: Payer: Self-pay | Admitting: Family Medicine

## 2016-02-01 NOTE — Telephone Encounter (Signed)
Please call  Re: abilify... She states she was waiting to hear back after Dr, Tomi Bamberger spoke to Dr. Caprice Beaver about her restarting ablify but she has not heard anything

## 2016-02-02 ENCOUNTER — Other Ambulatory Visit: Payer: Self-pay

## 2016-02-02 ENCOUNTER — Ambulatory Visit (INDEPENDENT_AMBULATORY_CARE_PROVIDER_SITE_OTHER): Payer: Self-pay | Admitting: Neurology

## 2016-02-02 DIAGNOSIS — R5383 Other fatigue: Secondary | ICD-10-CM

## 2016-02-02 DIAGNOSIS — Z1231 Encounter for screening mammogram for malignant neoplasm of breast: Secondary | ICD-10-CM

## 2016-02-02 NOTE — Telephone Encounter (Signed)
Spoke with patient and she felt like she was doing pretty well when she was on the abilify, definitely helped at the time when she was on it. She said she would like to stay off any meds that she doesn't need. She was really unsure as to whether it helped, said she really couldn't remember since she had so much going on since it was stopped on 12/22/15. So she is not sure if it helped before but said maybe it did. Hope that helps, we just kept going around and around in circles, sorry.

## 2016-02-02 NOTE — Telephone Encounter (Signed)
Left message for Dr. Caprice Beaver to c/b.  Somehow last message was lost... Please call [name] and see how she is doing. Please apologize for the delay, had trouble getting in contact and message got lost in the shuffle.  Waiting on call back on, but wanted to hear from pt to see how she is doing without it.

## 2016-02-06 ENCOUNTER — Other Ambulatory Visit: Payer: Self-pay | Admitting: Family Medicine

## 2016-02-06 NOTE — Telephone Encounter (Signed)
This hasn't been filled by [place] since 2015.  There is the potential for serotonin syndrome with her concomitant use of (high dose) cymbalta.  Since this may have been what caused her last hospitalization, I'd prefer to avoid imitrex for migraines.  She can discuss with her neurologist if questions. (Dr. Valaria Good)

## 2016-02-06 NOTE — Telephone Encounter (Signed)
Is this okay to refill? 

## 2016-02-06 NOTE — Telephone Encounter (Signed)
Patient advised.

## 2016-02-10 NOTE — Telephone Encounter (Signed)
Still have not received any call back. Pt had been made aware earlier in the week that we hadn't heard back, and she was going to call there.

## 2016-02-15 NOTE — Progress Notes (Signed)
Not seen

## 2016-02-22 ENCOUNTER — Other Ambulatory Visit: Payer: Self-pay | Admitting: Family Medicine

## 2016-02-26 NOTE — Telephone Encounter (Signed)
V--please check with pt and see if she has had f/u with Dr. Caprice Beaver yet. At this point, since I never got a call back, it is long enough off med that she needs to f/u with her to see how she is doing, to see if med needs to be restarted (same vs different).  Thanks

## 2016-02-27 NOTE — Telephone Encounter (Signed)
Left message for pt to call me back 

## 2016-02-29 NOTE — Telephone Encounter (Signed)
Spoke with patient and she did see Dr.Mckinney a couple of weeks ago. She was restarted in cymbalta 30mg  BID back in Jan (I believe you were already aware of this) and was NOT put back on the abilify at the [place] visit. Patient feels like she is doing "okay," without it. Also said that Dr.Mckinney stated that she never had a message to call you-just an FYI.

## 2016-03-12 ENCOUNTER — Ambulatory Visit
Admission: RE | Admit: 2016-03-12 | Discharge: 2016-03-12 | Disposition: A | Discharge status: Home / Self Care | Payer: 59 | Source: Ambulatory Visit

## 2016-03-12 DIAGNOSIS — Z1231 Encounter for screening mammogram for malignant neoplasm of breast: Secondary | ICD-10-CM

## 2016-03-29 DIAGNOSIS — G4700 Insomnia, unspecified: Secondary | ICD-10-CM | POA: Insufficient documentation

## 2016-03-29 DIAGNOSIS — Z79899 Other long term (current) drug therapy: Secondary | ICD-10-CM | POA: Insufficient documentation

## 2016-03-29 DIAGNOSIS — R251 Tremor, unspecified: Secondary | ICD-10-CM | POA: Insufficient documentation

## 2016-03-29 DIAGNOSIS — G43009 Migraine without aura, not intractable, without status migrainosus: Secondary | ICD-10-CM | POA: Insufficient documentation

## 2016-03-29 DIAGNOSIS — G2581 Restless legs syndrome: Secondary | ICD-10-CM | POA: Insufficient documentation

## 2016-03-29 DIAGNOSIS — G609 Hereditary and idiopathic neuropathy, unspecified: Secondary | ICD-10-CM | POA: Insufficient documentation

## 2016-03-29 DIAGNOSIS — M791 Myalgia, unspecified site: Secondary | ICD-10-CM | POA: Insufficient documentation

## 2016-03-29 DIAGNOSIS — G253 Myoclonus: Secondary | ICD-10-CM | POA: Insufficient documentation

## 2016-04-16 ENCOUNTER — Encounter: Payer: Self-pay | Admitting: Family Medicine

## 2016-04-16 ENCOUNTER — Ambulatory Visit (INDEPENDENT_AMBULATORY_CARE_PROVIDER_SITE_OTHER): Payer: 59 | Admitting: Family Medicine

## 2016-04-16 VITALS — BP 130/80 | HR 88 | Ht 62.75 in | Wt 207.8 lb

## 2016-04-16 DIAGNOSIS — S20211A Contusion of right front wall of thorax, initial encounter: Secondary | ICD-10-CM | POA: Diagnosis not present

## 2016-04-16 DIAGNOSIS — M25511 Pain in right shoulder: Secondary | ICD-10-CM | POA: Diagnosis not present

## 2016-04-16 MED ORDER — HYDROCODONE-ACETAMINOPHEN 5-325 MG PO TABS: ORAL_TABLET | ORAL | Status: DC

## 2016-04-16 NOTE — Progress Notes (Signed)
Chief Complaint  Patient presents with  . Fall    fell off bed Saturday around 5am. Hit right side of her head and in her shoulder and rib area. Having difficulty taking deep breaths.    2 mornings ago she fell off the bed.  The right side of her posterior head/neck hit the handle of the stepstool, along with her shoulder, and her right ribs hit the stool itself.  She was dreaming she was reaching for something, and in the process, fell out of bed. Denies any LOC, head injury, laceration.    The pain in the right ribs/side has gotten somewhat worse over the last day. Having some nausea related to the pain, and feels harder to breathe and talk today compared to yesterday. It hurts a lot when she coughs.  She has been icing the area.  She used tylenol.  Yesterday she used hydrocodone she had left from a dental implant, and also took a dose this morning.  7-8/10 currently, was 10/10 earlier today.  PMH, PSH, SH reviewed.  Outpatient Encounter Prescriptions as of 04/16/2016  Medication Sig Note  . acetaminophen (TYLENOL) 500 MG tablet Take 1,000 mg by mouth every 6 (six) hours as needed for mild pain. Reported on 01/02/2016   . ALPRAZolam (XANAX) 0.25 MG tablet Take 0.25 mg by mouth 3 (three) times daily as needed for anxiety.  04/16/2016: Takes one daily, usually  . Armodafinil 250 MG tablet Take 250 mg by mouth daily.  04/16/2016: Received from: External Pharmacy  . atorvastatin (LIPITOR) 40 MG tablet TAKE 1 TABLET (40 MG TOTAL) BY MOUTH DAILY.   . cholecalciferol (VITAMIN D) 1000 units tablet Take 1,000 Units by mouth daily.   Marland Kitchen dronabinol (MARINOL) 5 MG capsule Take 5 mg by mouth daily before lunch.  04/16/2016: Received from: [facility]  . DULoxetine (CYMBALTA) 30 MG capsule Take 1 capsule (30 mg total) by mouth 2 (two) times daily. (Patient taking differently: Take 60 mg by mouth 2 (two) times daily. ) 01/02/2016: Increased back to 60mg  BID by Dr. Caprice Beaver  . fluticasone  (FLONASE) 50 MCG/ACT nasal spray Place 2 sprays into both nostrils daily.   Marland Kitchen HYDROcodone-acetaminophen (NORCO/VICODIN) 5-325 MG tablet TAKE 1 OR 2 TABLETS BY MOUTH EVERY 4-6 HOURS AS NEEDED FOR PAIN   . metoprolol (LOPRESSOR) 50 MG tablet Take 1 tablet (50 mg total) by mouth 2 (two) times daily.   . Multiple Vitamins-Minerals (PRESERVISION AREDS PO) Take 1 capsule by mouth daily. Reported on 01/02/2016 12/22/2015: Pt's personal med lists an "I Caps" supplement.    . nortriptyline (PAMELOR) 50 MG capsule Take 150 mg by mouth at bedtime.   . pantoprazole (PROTONIX) 40 MG tablet Take 40 mg by mouth 2 (two) times daily.   . Probiotic Product ([place]) CAPS Take 1 capsule by mouth daily. Reported on 01/02/2016   . [DISCONTINUED] HYDROcodone-acetaminophen (NORCO/VICODIN) 5-325 MG tablet TAKE 1 OR 2 TABLETS BY MOUTH EVERY 4-6 HOURS AS NEEDED FOR PAIN 04/16/2016: Received from: External Pharmacy  . [DISCONTINUED] nortriptyline (PAMELOR) 50 MG capsule  04/16/2016: Received from: [facility]  . albuterol (PROVENTIL HFA;VENTOLIN HFA) 108 (90 Base) MCG/ACT inhaler Inhale into the lungs every 6 (six) hours as needed for wheezing or shortness of breath. Reported on 04/16/2016   . ondansetron (ZOFRAN) 8 MG tablet Take 8 mg by mouth every 8 (eight) hours as needed for nausea or vomiting. Reported on 04/16/2016   . prochlorperazine (COMPAZINE) 10 MG  tablet Take 10 mg by mouth every 6 (six) hours as needed for nausea or vomiting. Reported on 04/16/2016   . promethazine (PHENERGAN) 25 MG suppository Place 25 mg rectally every 6 (six) hours as needed for nausea or vomiting. Reported on 04/16/2016   . SUMAtriptan (IMITREX) 50 MG tablet Take 50 mg by mouth every 2 (two) hours as needed for migraine. Reported on 04/16/2016   . [DISCONTINUED] chlorhexidine (PERIDEX) 0.12 % solution SWISH WITH 1 CAPFUL,HOLD X 2 MINUTES,THEN EXPECTORATE TWICE A DAY 04/16/2016: Received from: External Pharmacy  .  [DISCONTINUED] DULoxetine (CYMBALTA) 60 MG capsule Take 60 mg by mouth daily. 04/16/2016: Received from: External Pharmacy  . [DISCONTINUED] PAZEO 0.7 % SOLN INSTILL 1 DROP EACH EYE ONCE DAILY 04/16/2016: Received from: External Pharmacy   No facility-administered encounter medications on file as of 04/16/2016.   Allergies  Allergen Reactions  . Erythromycin Nausea Only  . Tramadol Itching  . Dilaudid [Hydromorphone Hcl] Itching   ROS: No fever/chills, headache or dizziness. She has some soreness on the back of the right side of her head (sore to touch).  No numbness or tingling. Some nausea/vomiting (heaving) as per her usual.  Stools are normal.  Denies bleeding/bruising, no swelling. Denies chest pain.  PHYSICAL EXAM:  BP 130/80 mmHg  Pulse 88  Ht 5' 2.75" (1.594 m)  Wt 207 lb 12.8 oz (94.257 kg)  BMI 37.10 kg/m2  SpO2 97%  LMP 07/27/2006 Well appearing female, in no distress, no apparent discomfort, in good spirits HEENT: PERRL, EOMI, conjunctiva and sclera are clear.  R TM and EAC normal. Right mastoid area there is soft tissue swelling (small amount) with ecchymosis noted (green and slightly purple). c-spine is nontender. No lymphadenopathy.  FROM of neck. FROM of right shoulder Heart: regular rate and rhythm without murmur Lung: clear bilaterally. Good air movement Chest wall--tender to palpation over the lateral right chest wall at the level of her breast. Small focal area anteriorly is tender to palpation as well.  No bony step-offs, ecchymosis, soft tissue swelling or other abnormality noted Abdomen: soft, nontender, no mass Extremities: no edema  ASSESSMENT/PLAN:  Rib contusion, right, initial encounter - Plan: HYDROcodone-acetaminophen (NORCO/VICODIN) 5-325 MG tablet  Pain in joint of right shoulder  Contusions related to fall without significant injury. Supportive measures.   She only has #3 left of the pain pills--refill. Discussed acetaminophen in detail, to  avoid overuse. Risks/side effects of meds reviewed.    You may continue to use Tylenol as needed for pain. You must, however, pay attention to the amount of acetaminophen you are getting from all sources of medication--ie the hydrocodone also contains it. You must be sure not to exceed 4000mg  daily of acetaminophen (or it will hurt the liver).  You must wait at least 4 hours after taking tylenol before taking a pain pill.  If you are able to take Aleve with food, this might also be very helpful--take it twice daily. It will help with pain and inflammation, and can be used along with the tylenol (and/or hydrocodone).   Use the hydrocodone sparingly, only for severe pain. Continue ice and/or heat, whichever feels better.  Avoid bending/twisting, avoid significant exertion that makes you take a lot of deep breaths, as this might flare your pain. Over the next few days, as your pain diminishes, feel free to advance your activity back to normal as tolerated.

## 2016-04-16 NOTE — Patient Instructions (Signed)
You may continue to use Tylenol as needed for pain. You must, however, pay attention to the amount of acetaminophen you are getting from all sources of medication--ie the hydrocodone also contains it. You must be sure not to exceed 4000mg  daily of acetaminophen (or it will hurt the liver).  You must wait at least 4 hours after taking tylenol before taking a pain pill.  If you are able to take Aleve with food, this might also be very helpful--take it twice daily. It will help with pain and inflammation, and can be used along with the tylenol (and/or hydrocodone).   Use the hydrocodone sparingly, only for severe pain. Continue ice and/or heat, whichever feels better.  Avoid bending/twisting, avoid significant exertion that makes you take a lot of deep breaths, as this might flare your pain. Over the next few days, as your pain diminishes, feel free to advance your activity back to normal as tolerated.    Rib Contusion A rib contusion is a deep bruise on your rib area. Contusions are the result of a blunt trauma that causes bleeding and injury to the tissues under the skin. A rib contusion may involve bruising of the ribs and of the skin and muscles in the area. The skin overlying the contusion may turn blue, purple, or yellow. Minor injuries will give you a painless contusion, but more severe contusions may stay painful and swollen for a few weeks. CAUSES  A contusion is usually caused by a blow, trauma, or direct force to an area of the body. This often occurs while playing contact sports. SYMPTOMS  Swelling and redness of the injured area.  Discoloration of the injured area.  Tenderness and soreness of the injured area.  Pain with or without movement. DIAGNOSIS  The diagnosis can be made by taking a medical history and performing a physical exam. An X-ray, CT scan, or MRI may be needed to determine if there were any associated injuries, such as broken bones (fractures) or internal  injuries. TREATMENT  Often, the best treatment for a rib contusion is rest. Icing or applying cold compresses to the injured area may help reduce swelling and inflammation. Deep breathing exercises may be recommended to reduce the risk of partial lung collapse and pneumonia. Over-the-counter or prescription medicines may also be recommended for pain control. HOME CARE INSTRUCTIONS   Apply ice to the injured area:  Put ice in a plastic bag.  Place a towel between your skin and the bag.  Leave the ice on for 20 minutes, 2-3 times per day.  Take medicines only as directed by your health care provider.  Rest the injured area. Avoid strenuous activity and any activities or movements that cause pain. Be careful during activities and avoid bumping the injured area.  Perform deep-breathing exercises as directed by your health care provider.  Do not lift anything that is heavier than 5 lb (2.3 kg) until your health care provider approves.  Do not use any tobacco products, including cigarettes, chewing tobacco, or electronic cigarettes. If you need help quitting, ask your health care provider. SEEK MEDICAL CARE IF:   You have increased bruising or swelling.  You have pain that is not controlled with treatment.  You have a fever. SEEK IMMEDIATE MEDICAL CARE IF:   You have difficulty breathing or shortness of breath.  You develop a continual cough, or you cough up thick or bloody sputum.  You feel sick to your stomach (nauseous), you throw up (vomit), or you  have abdominal pain.   This information is not intended to replace advice given to you by your health care provider. Make sure you discuss any questions you have with your health care provider.   Document Released: 08/07/2001 Document Revised: 12/03/2014 Document Reviewed: 08/24/2014 Elsevier Interactive Patient Education [employer].

## 2016-05-03 ENCOUNTER — Encounter: Payer: Self-pay | Admitting: Gynecology

## 2016-05-03 ENCOUNTER — Ambulatory Visit (INDEPENDENT_AMBULATORY_CARE_PROVIDER_SITE_OTHER): Payer: 59 | Admitting: Gynecology

## 2016-05-03 VITALS — BP 126/84 | Ht 64.0 in | Wt 207.0 lb

## 2016-05-03 DIAGNOSIS — N952 Postmenopausal atrophic vaginitis: Secondary | ICD-10-CM

## 2016-05-03 DIAGNOSIS — N951 Menopausal and female climacteric states: Secondary | ICD-10-CM | POA: Diagnosis not present

## 2016-05-03 DIAGNOSIS — K429 Umbilical hernia without obstruction or gangrene: Secondary | ICD-10-CM | POA: Diagnosis not present

## 2016-05-03 DIAGNOSIS — Z01419 Encounter for gynecological examination (general) (routine) without abnormal findings: Secondary | ICD-10-CM | POA: Diagnosis not present

## 2016-05-03 NOTE — Progress Notes (Addendum)
    [name] [birth year] [id number]        60 y.o.  [name]  for annual exam.    Past medical history,surgical history, problem list, medications, allergies, family history and social history were all reviewed and documented as reviewed in the EPIC chart.  ROS:  Performed with pertinent positives and negatives included in the history, assessment and plan.   Additional significant findings :  None   Exam: [name] assistant Filed Vitals:   05/03/16 1027  BP: 126/84  Height: 5\' 4"  (1.626 m)  Weight: 207 lb (93.895 kg)   General appearance:  Normal affect, orientation and appearance. Skin: Grossly normal HEENT: Without gross lesions.  No cervical or supraclavicular adenopathy. Thyroid normal.  Lungs:  Clear without wheezing, rales or rhonchi Cardiac: RR, without RMG Abdominal:  Soft, nontender, without masses, guarding, rebound, organomegaly. Umbilical hernia noted, easily reducible Breasts:  Examined lying and sitting without masses, retractions, discharge or axillary adenopathy. Pelvic:  Ext/BUS/vagina With atrophic changes  Cervix normal  Uterus anteverted, normal size, shape and contour, midline and mobile nontender   Adnexa without masses or tenderness    Anus and perineum normal   Rectovaginal normal sphincter tone without palpated masses or tenderness.    Assessment/Plan:  60 y.o. G66P1001 female for annual exam.   1. Postmenopausal. Is having some hot flashes and sweats. Off HRT. Relates having thyroid checked at her primary physician's office. Recommended trial of OTC products such as Estrovin to see if it does not help. Does not want to start HRT again. We'll called continues to be an issue. No vaginal bleeding and she knows to call report any vaginal bleeding. 2. Umbilical hernia. Present for years not bothersome to the patient. Easily reduces. 3. Mammography 02/2016. Continue with annual mammography when due. 4. Pap smear 2016. No Pap smear done today. No history  of abnormal Pap smears. Plan repeat Pap smear at 3 year interval per current screening guidelines. 5. DEXA 2010 normal. Plan repeat DEXA next year at age 72. Increased calcium and vitamin D. 6. Colonoscopy 2014. Repeat at their recommended interval. 7. Health maintenance. No routine lab work done as patient relates this done elsewhere. Follow up one year, sooner as needed.   Leah Auerbach MD, 10:51 AM 05/03/2016

## 2016-05-03 NOTE — Patient Instructions (Signed)

## 2016-05-04 ENCOUNTER — Ambulatory Visit: Payer: 59 | Admitting: Podiatry

## 2016-05-09 ENCOUNTER — Encounter: Payer: Self-pay | Admitting: Family Medicine

## 2016-05-09 ENCOUNTER — Ambulatory Visit (INDEPENDENT_AMBULATORY_CARE_PROVIDER_SITE_OTHER): Payer: 59 | Admitting: Family Medicine

## 2016-05-09 VITALS — BP 124/72 | HR 84 | Temp 97.9°F | Ht 62.75 in | Wt 206.0 lb

## 2016-05-09 DIAGNOSIS — N3001 Acute cystitis with hematuria: Secondary | ICD-10-CM | POA: Diagnosis not present

## 2016-05-09 DIAGNOSIS — R350 Frequency of micturition: Secondary | ICD-10-CM | POA: Diagnosis not present

## 2016-05-09 LAB — POCT URINALYSIS DIPSTICK
Bilirubin, UA: NEGATIVE
Glucose, UA: NEGATIVE
Ketones, UA: NEGATIVE
Nitrite, UA: NEGATIVE
Spec Grav, UA: >=1.03
Urobilinogen, UA: 1
pH, UA: 5.5

## 2016-05-09 MED ORDER — CIPROFLOXACIN HCL 250 MG PO TABS
250.0000 mg | ORAL_TABLET | Freq: Two times a day (BID) | ORAL | Status: DC
Start: 2016-05-09 — End: 2016-05-30

## 2016-05-09 NOTE — Progress Notes (Signed)
Chief Complaint  Patient presents with  . Urinary Frequency    and burning as well as pressure that began yesterday. Does state that last week she felt like she was having some frequency. Thinks there may be some blood in her urine-and when she wipes.    Last week she noticed some increase in urinary frequency--urine looked normal, and no pain.  2 nights ago she noticed that her urine was dark, and noticed a slight red tint to the urine when she wiped.  She also now has some lower abdominal pressure that is constant, not related to voiding.  Denies vaginal discharge, odor, itch, fever, chills, flank pain.,  Last UTI was in January. Culture at that time showed proteus.  She reports that she is scheduled for endoscopy at [facility], concern for esophageal spasm vs stricture. She had some trouble swallowing liquids recently. Overall her gastroparesis is stable.  PMH, PSH, SH reviewed  Outpatient Encounter Prescriptions as of 05/09/2016  Medication Sig Note  . acetaminophen (TYLENOL) 500 MG tablet Take 1,000 mg by mouth every 6 (six) hours as needed for mild pain. Reported on 01/02/2016   . ALPRAZolam (XANAX) 0.25 MG tablet Take 0.25 mg by mouth 3 (three) times daily as needed for anxiety.  04/16/2016: Takes one daily, usually  . Armodafinil 250 MG tablet Take 250 mg by mouth daily.  04/16/2016: Received from: External Pharmacy  . atorvastatin (LIPITOR) 40 MG tablet TAKE 1 TABLET (40 MG TOTAL) BY MOUTH DAILY.   . cholecalciferol (VITAMIN D) 1000 units tablet Take 1,000 Units by mouth daily.   Marland Kitchen dronabinol (MARINOL) 5 MG capsule Take 5 mg by mouth daily before lunch.  04/16/2016: Received from: [facility]  . DULoxetine (CYMBALTA) 30 MG capsule Take 1 capsule (30 mg total) by mouth 2 (two) times daily. (Patient taking differently: Take 60 mg by mouth 2 (two) times daily. ) 01/02/2016: Increased back to 60mg  BID by Dr. Caprice Beaver  . fluticasone (FLONASE) 50 MCG/ACT nasal spray Place 2  sprays into both nostrils daily.   . hyoscyamine (ANASPAZ) 0.125 MG TBDP disintergrating tablet Place 0.125 mg under the tongue.   . metoprolol (LOPRESSOR) 50 MG tablet Take 1 tablet (50 mg total) by mouth 2 (two) times daily.   . Multiple Vitamins-Minerals (PRESERVISION AREDS PO) Take 1 capsule by mouth daily. Reported on 01/02/2016 12/22/2015: Pt's personal med lists an "I Caps" supplement.    . nortriptyline (PAMELOR) 50 MG capsule Take 150 mg by mouth at bedtime.   . pantoprazole (PROTONIX) 40 MG tablet Take 40 mg by mouth 2 (two) times daily.   . Probiotic Product ([place]) CAPS Take 1 capsule by mouth daily. Reported on 01/02/2016   . ranitidine (ZANTAC) 300 MG capsule Take 300 mg by mouth. 05/09/2016: Received from: [facility]  . topiramate (TOPAMAX) 25 MG tablet Take 50 mg by mouth at bedtime. 05/09/2016: Received from: External Pharmacy  . [DISCONTINUED] hyoscyamine (LEVSIN/SL) 0.125 MG SL tablet Take 0.125 mg by mouth. 05/09/2016: Received from: [facility]  . albuterol (PROVENTIL HFA;VENTOLIN HFA) 108 (90 Base) MCG/ACT inhaler Inhale into the lungs every 6 (six) hours as needed for wheezing or shortness of breath. Reported on 05/09/2016   . ciprofloxacin (CIPRO) 250 MG tablet Take 1 tablet (250 mg total) by mouth 2 (two) times daily.   . ondansetron (ZOFRAN) 8 MG tablet Take 8 mg by mouth every 8 (eight) hours as needed for nausea or vomiting.  Reported on 05/09/2016   . prochlorperazine (COMPAZINE) 10 MG tablet Take 10 mg by mouth every 6 (six) hours as needed for nausea or vomiting. Reported on 05/09/2016   . promethazine (PHENERGAN) 25 MG suppository Place 25 mg rectally every 6 (six) hours as needed for nausea or vomiting. Reported on 05/09/2016   . [DISCONTINUED] ranitidine (ZANTAC) 300 MG capsule Take 300 mg by mouth every evening.    No facility-administered encounter medications on file as of 05/09/2016.    Allergies  Allergen  Reactions  . Erythromycin Nausea Only  . Tramadol Itching  . Dilaudid [Hydromorphone Hcl] Itching   ROS: no headache, dizziness, URI symptoms, chest pain, palpitations, shortness of breath.  GI symptoms as per HPI. No bleeding (just as noted with wiping after void), bruising, rash. No vaginal discharge, bleeding.  See HPI  PHYSICAL EXAM: BP 124/72 mmHg  Pulse 84  Temp(Src) 97.9 F (36.6 C) (Tympanic)  Ht 5' 2.75" (1.594 m)  Wt 206 lb (93.441 kg)  BMI 36.78 kg/m2  LMP 07/27/2006  Well developed, pleasant female in no distress, in good spirits. Abdomen: Mild suprapubic tenderness.  nontender elsewhere.  No masses Back: No CVA tenderness Heart: regular rate and rhythm Lungs: clear bilaterally Extremities: no edema Neuro: alert and oriented, cranial nerves intact, normal strength, gait Psych: normal mood, affect, hygiene and grooming   Urine dip: trace leuks, 1+ protein, 3+ blood, SG >=1.030  ASSESSMENT/PLAN:  Acute cystitis with hematuria - Plan: Urine culture, ciprofloxacin (CIPRO) 250 MG tablet  Urinary frequency - Plan: POCT Urinalysis Dipstick   cipro 250mg  BID x 7 days.  Call Monday if not better, sooner if worse (fevers, flank pain)

## 2016-05-09 NOTE — Patient Instructions (Signed)
Take the antibiotics twice daily for 7 days.  Call Monday if not better, sooner if worse (fevers, flank pain)  Be sure to drink plenty of fluids.

## 2016-05-10 LAB — URINE CULTURE: Colony Count: 60000

## 2016-05-15 ENCOUNTER — Ambulatory Visit: Payer: 59 | Admitting: Podiatry

## 2016-05-17 ENCOUNTER — Encounter: Payer: Self-pay | Admitting: Podiatry

## 2016-05-17 ENCOUNTER — Ambulatory Visit (INDEPENDENT_AMBULATORY_CARE_PROVIDER_SITE_OTHER): Payer: 59 | Admitting: Podiatry

## 2016-05-17 ENCOUNTER — Ambulatory Visit (INDEPENDENT_AMBULATORY_CARE_PROVIDER_SITE_OTHER): Payer: 59

## 2016-05-17 VITALS — BP 140/63 | HR 92 | Resp 16

## 2016-05-17 DIAGNOSIS — M79674 Pain in right toe(s): Secondary | ICD-10-CM

## 2016-05-17 DIAGNOSIS — M10079 Idiopathic gout, unspecified ankle and foot: Secondary | ICD-10-CM

## 2016-05-17 DIAGNOSIS — G629 Polyneuropathy, unspecified: Secondary | ICD-10-CM

## 2016-05-17 DIAGNOSIS — M109 Gout, unspecified: Secondary | ICD-10-CM

## 2016-05-17 MED ORDER — GABAPENTIN 300 MG PO CAPS: 300.0000 mg | ORAL_CAPSULE | Freq: Every day | ORAL | Status: DC

## 2016-05-17 MED ORDER — GABAPENTIN 300 MG PO CAPS: 300.0000 mg | ORAL_CAPSULE | Freq: Three times a day (TID) | ORAL | Status: DC

## 2016-05-17 NOTE — Progress Notes (Signed)
   Subjective:    Patient ID: [name], female    DOB: Nov 18, 1956, 60 y.o.   MRN: [id number]  HPI: She presents today with a chief complaint of her neuropathy which seems to be getting worse. She states that currently she is taking nortriptyline prescribed by her neurologist for the neuropathy. She states that that plus the Cymbalta really doesn't seem to be helping any longer and the neuropathy seeming to breakthrough medication. She is also concerned about having had gout in the past particularly the first metatarsophalangeal joint. She states that just the other week she developed redness and swelling in the joint with considerable pain which has now subsided. She states that the first metatarsophalangeal joint no longer hurts. She states that she had been diagnosed with gout previously and she brought blood work with her today which did demonstrate uric acid level of greater than 10 just 2 months ago.    Review of Systems  Constitutional: Positive for diaphoresis and fatigue.  Musculoskeletal: Positive for myalgias.  All other systems reviewed and are negative.      Objective:   Physical Exam: Vital signs are stable she is alert and oriented 3. Pulses are palpable. Neurologic sensorium is diminished per Semmes-Weinstein monofilament. Deep tendon reflexes are not elicitable muscle strength is +5 over 5 dorsiflexion plantar flexors inverters and everters all intrinsic musculature is intact. Orthopedic evaluation of his drains all joints distal to the ankle in full range of motion without crepitation. She has mild hallux abductovalgus deformity of the right foot with no tenderness on palpation of the first metatarsophalangeal joint that she does have a palpable bursa overlying the dorsomedial aspect of the joint. There is also overlying postinflammatory hyperpigmentation but no pain today. She does have tenderness of her feet but nonreproducible. This is consistent with neuropathy. Radiographic  evaluation does not demonstrate any type of osseus abnormality of the right foot other than a mild bunion. Cutaneous evaluation no open lesions.        Assessment & Plan:  Assessment: Idiopathic neuropathy previously diagnosed currently utilizing nortriptyline and Cymbalta. History of gout right foot.  Plan: I explained to her that I will like to see gout in process and the next time that she develops pain like that she is to notify us immediately for blood work. Also I recommended that she discuss with her neurologist switching her from nortriptyline to gabapentin. I did explain to her how to do this by discontinuing the use of the nortriptyline at bedtime starting with gabapentin 300 mg for which I wrote a prescription. I will follow-up with her in 4-6 weeks to make sure that she is tolerating the medicine well in that it is covering her neuropathic pain. Should she have questions or concerns she will notify us immediately.

## 2016-05-30 ENCOUNTER — Encounter: Payer: Self-pay | Admitting: Family Medicine

## 2016-05-30 ENCOUNTER — Ambulatory Visit (INDEPENDENT_AMBULATORY_CARE_PROVIDER_SITE_OTHER): Payer: 59 | Admitting: Family Medicine

## 2016-05-30 VITALS — BP 110/70 | HR 76 | Temp 98.3°F | Ht 62.75 in | Wt 207.0 lb

## 2016-05-30 DIAGNOSIS — Z5181 Encounter for therapeutic drug level monitoring: Secondary | ICD-10-CM | POA: Diagnosis not present

## 2016-05-30 DIAGNOSIS — M10071 Idiopathic gout, right ankle and foot: Secondary | ICD-10-CM

## 2016-05-30 DIAGNOSIS — R319 Hematuria, unspecified: Secondary | ICD-10-CM | POA: Diagnosis not present

## 2016-05-30 DIAGNOSIS — R7301 Impaired fasting glucose: Secondary | ICD-10-CM | POA: Diagnosis not present

## 2016-05-30 DIAGNOSIS — Z79899 Other long term (current) drug therapy: Secondary | ICD-10-CM | POA: Diagnosis not present

## 2016-05-30 DIAGNOSIS — G609 Hereditary and idiopathic neuropathy, unspecified: Secondary | ICD-10-CM

## 2016-05-30 DIAGNOSIS — R109 Unspecified abdominal pain: Secondary | ICD-10-CM | POA: Diagnosis not present

## 2016-05-30 DIAGNOSIS — M109 Gout, unspecified: Secondary | ICD-10-CM

## 2016-05-30 DIAGNOSIS — E785 Hyperlipidemia, unspecified: Secondary | ICD-10-CM | POA: Diagnosis not present

## 2016-05-30 DIAGNOSIS — G4733 Obstructive sleep apnea (adult) (pediatric): Secondary | ICD-10-CM

## 2016-05-30 DIAGNOSIS — J309 Allergic rhinitis, unspecified: Secondary | ICD-10-CM

## 2016-05-30 LAB — COMPREHENSIVE METABOLIC PANEL
ALT: 17 U/L (ref 6–29)
AST: 16 U/L (ref 10–35)
Albumin: 3.6 g/dL (ref 3.6–5.1)
Alkaline Phosphatase: 112 U/L (ref 33–130)
BUN: 17 mg/dL (ref 7–25)
CO2: 22 mmol/L (ref 20–31)
Calcium: 9 mg/dL (ref 8.6–10.4)
Chloride: 104 mmol/L (ref 98–110)
Creat: 0.79 mg/dL (ref 0.50–1.05)
Glucose, Bld: 82 mg/dL (ref 65–99)
Potassium: 4.3 mmol/L (ref 3.5–5.3)
Sodium: 138 mmol/L (ref 135–146)
Total Bilirubin: 0.3 mg/dL (ref 0.2–1.2)
Total Protein: 6.5 g/dL (ref 6.1–8.1)

## 2016-05-30 LAB — URIC ACID: Uric Acid, Serum: 6.4 mg/dL (ref 2.5–7.0)

## 2016-05-30 LAB — POCT GLYCOSYLATED HEMOGLOBIN (HGB A1C): Hemoglobin A1C: 5.9

## 2016-05-30 LAB — POCT URINALYSIS DIPSTICK
Bilirubin, UA: NEGATIVE
Glucose, UA: NEGATIVE
Ketones, UA: NEGATIVE
Nitrite, UA: NEGATIVE
Protein, UA: NEGATIVE
Spec Grav, UA: 1.015
Urobilinogen, UA: NEGATIVE
pH, UA: 6

## 2016-05-30 MED ORDER — ATORVASTATIN CALCIUM 40 MG PO TABS: ORAL_TABLET | ORAL | Status: DC

## 2016-05-30 NOTE — Progress Notes (Signed)
Chief Complaint  Patient presents with  . Hyperlipidemia    nonfasting med check.  . Abdominal Pain    and states that she saw some blood in her urine.    She was seen with cystitis (and hematuria) on 6/14. She was treated with cipro x 7 days.  She took the full course, and symptoms completely resolved.  Culture showed 60K colonies, of multiple bacterial morphotypes, none predominant. She is having some recurrent lower abdominal pain/pressure.  Prior to this she had some vaginal itching.  The urine had gotten dark again.  Yesterday morning the urine was bloody--it looked orange on the toilet paper, not bloody.  She reports she did take Azo the night before.  The azo helped with the lower abdominal discomfort.  Today she hasn't noticed any blood.  Pressure in the lower abdomen isn't as bad today.  No fever, chills, flank pain. Bowels are normal.  She saw GYN in early June and reports that everything was normal.   IFG--patient presents today to follow up on pre-diabetes. She is walking twice daily x 30 minutes, at least 5 days/week.  She is trying to avoid sweets, sugars.  Admits some noncompliance (some stress eating around Father's Day).  She has lost about 15# since January, when her A1c was 6.3.  Allergies: doing pretty well. Using Flonase and zyrtec every day.  OSA--on CPAP, tolerating well. Denies daytime somnolence.  Hyperlipidemia follow-up: Patient is trying to follow a low-fat, low cholesterol diet as best she can. Compliant with medications and denies medication side effects. Not fasting today. Last lipids: Lab Results  Component Value Date   CHOL 162 12/07/2015   HDL 52 12/07/2015   LDLCALC 91 12/07/2015   TRIG 97 12/07/2015   CHOLHDL 3.1 12/07/2015   Migraines: Overall doing well, just about 1-2 times/month.  She no longer takes imitrex (since the hospitalization in January).  OTC medication was somewhat helpful. She has been back on topamax for prevention for a few months,  and this helps prevent the headaches, doing much better.  Tremor--This is controlled.  She is currently on a beta blocker (prescribed by cardiology).  Neuropathy in her feet, and RLS. Under the care of neurologist (Dr. Tonuzzi) She recently saw Dr. Hyatt (podiatrist) with toe pain and neuropathy.  He changed her meds--recommended stopping the nortriptyline and changing to gabapentin.  Thankfully, she checked with Dr. Tonuzzi's office, who said NOT to stop the nortriptyline, but that she can add the gabapentin. She has not yet started gabapentin.  She is concerned about the side effects. She does report that the neuropathy is bothersome.   She had pain in her right great toe--this is why she originally called the podiatrist.  She took colchicine and it resolved.  It recurred, but by the time she saw Dr Hyatt, her pain and swelling had improved.  She saw him 6/22, hasn't had further pain.  She didn't think it was gout, seemed different. Last uric acid level in our office, after being taken off diuretic, was improved. Lab Results  Component Value Date   LABURIC 6.0 01/02/2016   Fibromyalgia, treated by Dr. Deveshwar, stable Gastroparesis--stable overall. Under the care of Dr. Koch. Depression/anxiety--under the care of Dr. McKinney. Moods stable.  PMH, PSH, SH reviewed  Outpatient Encounter Prescriptions as of 05/30/2016  Medication Sig Note  . acetaminophen (TYLENOL) 500 MG tablet Take 1,000 mg by mouth every 6 (six) hours as needed for mild pain. Reported on 01/02/2016   . ALPRAZolam (  XANAX) 0.25 MG tablet Take 0.25 mg by mouth 3 (three) times daily as needed for anxiety.  04/16/2016: Takes one daily, usually  . Armodafinil 250 MG tablet Take 250 mg by mouth daily.  04/16/2016: Received from: External Pharmacy  . atorvastatin (LIPITOR) 40 MG tablet TAKE 1 TABLET (40 MG TOTAL) BY MOUTH DAILY.   . cholecalciferol (VITAMIN D) 1000 units tablet Take 1,000 Units by mouth daily.   Marland Kitchen dronabinol (MARINOL)  5 MG capsule Take 5 mg by mouth daily before lunch.  04/16/2016: Received from: [facility]  . DULoxetine (CYMBALTA) 60 MG capsule  05/17/2016: Received from: External Pharmacy  . hyoscyamine (ANASPAZ) 0.125 MG TBDP disintergrating tablet Place 0.125 mg under the tongue.   . metoprolol (LOPRESSOR) 50 MG tablet Take 1 tablet (50 mg total) by mouth 2 (two) times daily.   . Multiple Vitamins-Minerals (PRESERVISION AREDS PO) Take 1 capsule by mouth daily. Reported on 01/02/2016 12/22/2015: Pt's personal med lists an "I Caps" supplement.    . nortriptyline (PAMELOR) 50 MG capsule Take 150 mg by mouth at bedtime.   . ondansetron (ZOFRAN) 8 MG tablet Take 8 mg by mouth every 8 (eight) hours as needed for nausea or vomiting. Reported on 05/09/2016   . pantoprazole (PROTONIX) 40 MG tablet Take 40 mg by mouth 2 (two) times daily.   . Probiotic Product ([place]) CAPS Take 1 capsule by mouth daily. Reported on 01/02/2016   . prochlorperazine (COMPAZINE) 10 MG tablet Take 10 mg by mouth every 6 (six) hours as needed for nausea or vomiting. Reported on 05/09/2016   . promethazine (PHENERGAN) 25 MG suppository Place 25 mg rectally every 6 (six) hours as needed for nausea or vomiting. Reported on 05/09/2016   . ranitidine (ZANTAC) 300 MG capsule Take 300 mg by mouth. 05/09/2016: Received from: [facility]  . topiramate (TOPAMAX) 25 MG tablet Take 50 mg by mouth at bedtime. 05/09/2016: Received from: External Pharmacy  . [DISCONTINUED] atorvastatin (LIPITOR) 40 MG tablet TAKE 1 TABLET (40 MG TOTAL) BY MOUTH DAILY.   . [DISCONTINUED] PAZEO 0.7 % SOLN INSTILL 1 DROP EACH EYE ONCE DAILY 05/30/2016: Received from: External Pharmacy  . albuterol (PROVENTIL HFA;VENTOLIN HFA) 108 (90 Base) MCG/ACT inhaler Inhale into the lungs every 6 (six) hours as needed for wheezing or shortness of breath. Reported on 05/30/2016   . fluticasone (FLONASE) 50 MCG/ACT nasal spray Place 2 sprays  into both nostrils daily.   Marland Kitchen gabapentin (NEURONTIN) 300 MG capsule Take 1 capsule (300 mg total) by mouth at bedtime. (Patient not taking: Reported on 05/30/2016) 05/30/2016: Prescribed by podiatrist.  She hasn't started this yet.  . [DISCONTINUED] ciprofloxacin (CIPRO) 250 MG tablet Take 1 tablet (250 mg total) by mouth 2 (two) times daily.    No facility-administered encounter medications on file as of 05/30/2016.   Allergies  Allergen Reactions  . Erythromycin Nausea Only  . Tramadol Itching  . Dilaudid [Hydromorphone Hcl] Itching   ROS: no fever, chills, bowel changes.  Nausea/gastroparesis stable.  +lower pelvic pain and hematuria as per HPI.  No URI symptoms, cough ,shortness of breath, chest pain. No bleeding, bruising, rash. +neuropathy, joint pains.  Intentional weight loss, per HPI.  PHYSICAL EXAM: BP 110/70 mmHg  Pulse 76  Temp(Src) 98.3 F (36.8 C) (Tympanic)  Ht 5' 2.75" (1.594 m)  Wt 207 lb (93.895 kg)  BMI 36.95 kg/m2  LMP 07/27/2006 Well developed, pleasant female in no distress HEENT: PERRL ,EOMI, conjunctiva  and sclera are clear. OP clear Neck: no lymphadenopathy, thyromegaly or carotid bruit Heart: regular rate and rhythm without murmur Lungs: clear bilaterally Back: no CVA or spinal tenderness Abdomen: soft, normal bowel sounds. mildy tender in lower abdomen. No rebound tenderness or guarding Extremities: no edema, 2+ pulses Neuro: alert and oriented. Normal strength, gait Psych: normal mood, affect, hygiene and grooming  Lab Results  Component Value Date   HGBA1C 5.9 05/30/2016   (A1c down from 6.3 in January).  Urine dip:  1+ blood, trace leuks  ASSESSMENT/PLAN:  Hematuria - and lower abdominal pressure; r/o UTI (await culture). refer to urology if culture negative. h/o stones - Plan: Urine culture  IFG (impaired fasting glucose) - reviewed diet, exercise, need for weight loss - Plan: HgB A1c  OSA (obstructive sleep apnea) - continue  cpap  Hyperlipidemia - controlled per last check; nonfasting--monitor LFT's today. - Plan: Comprehensive metabolic panel, atorvastatin (LIPITOR) 40 MG tablet  Allergic rhinitis, unspecified allergic rhinitis type - controlled  Polypharmacy - discussed my note to podiatrist, and for her to always check with other doctors before adding/changing medications  Idiopathic peripheral neuropathy (HCC) - managed by Dr. Tonuzzi--who okay'd her adding gabapentin, but told not to stop the pamelor. side effects reviewed  Acute gout of right foot, unspecified cause - currently resolved, but recent toe pain. recheck uric acid level - Plan: Uric acid  Abdominal pain, unspecified abdominal location - Plan: POCT Urinalysis Dipstick  Medication monitoring encounter - Plan: Comprehensive metabolic panel   Urine culture--if no infection noted, needs referral to urologist for further evaluation of hematuria and pelvic pain.  She has been to [facility] in the past for kidney stones. c-met Uric acid 

## 2016-05-30 NOTE — Patient Instructions (Signed)
Continue your current medications. Thank you for checking with Dr. Valaria Good before making changes to your medications. We will be in touch with your test results tomorrow. If the uric acid level is high, we may start you on a medication to lower it, to prevent gout. The urine is being sent for culture--you will be started on an antibiotic only if the culture shows infection.  If the culture is negative, we will send you back to [facility] for further evaluation of the blood in the urine.  Continue the cholesterol medication.

## 2016-05-31 ENCOUNTER — Encounter: Payer: 59 | Admitting: Family Medicine

## 2016-06-01 LAB — URINE CULTURE: Colony Count: >=100000

## 2016-06-28 ENCOUNTER — Other Ambulatory Visit: Payer: Self-pay | Admitting: Urology

## 2016-06-28 ENCOUNTER — Ambulatory Visit: Payer: 59 | Admitting: Podiatry

## 2016-07-03 ENCOUNTER — Encounter (HOSPITAL_COMMUNITY): Payer: Self-pay | Admitting: *Deleted

## 2016-07-03 NOTE — Progress Notes (Signed)
  Spoke to patient via phone,history obtained,updated.  Bring blue folder,insurance cards,picture ID,designated driver  C-PAP machine Reinforced no aspirin(instructions to hold aspirin per your doctor), ibuprofen products 72 hours prior to procedure. No vitamins or herbal medicines 7 days prior to procedure.   Follow laxative instructions provided by urologist (office) and in blue folder.  Wear easy on/off clothing and no jewelry except wedding rings and ear rings. Leave all other valuables at home Nothing by mouth after MN.    [name] understanding of instructions

## 2016-07-04 NOTE — H&P (Signed)
CC: I have ureteral stone.  HPI: [name] is a 60 year-old female established patient who is here for ureteral stone.  Has been pain free since last visit. Did redevelop gross hematuria 3 days ago. Is not tolerating Tamsulosin.    The problem is on the right side. She first stated noticing pain on approximately 06/06/2016. This is not her first kidney stone. She is not currently having flank pain, back pain, groin pain, nausea, vomiting, fever or chills. She has not caught a stone in her urine strainer since her symptoms began.     ALLERGIES: Erythromycin Base TABS - Nausea tamsulosin - Dizziness, tachycardia Tramadol - Itching    MEDICATIONS: ALPRAZolam 0.25 MG Oral Tablet Oral  Atorvastatin Calcium 40 MG Oral Tablet Oral  Cetirizine HCl - 10 MG Oral Tablet Oral  Compazine  Cymbalta 60 MG Oral Capsule Delayed Release Particles Oral  Hydrocodone-Acetaminophen 5 mg-300 mg tablet 2 tablet PO Q 6 H  Ibuprofen 600 MG Oral Tablet Oral  Melatonin  Nortriptyline HCl - 10 MG Oral Capsule Oral  Nuvigil 250 MG Oral Tablet Oral  Ondansetron Hcl  Pantoprazole Sodium 20 MG Oral Tablet Delayed Release Oral  Phenergan 25 mg suppository, rectal  Prochlorperazine Maleate 10 MG Oral Tablet Oral  Propranolol HCl - 20 MG Oral Tablet Oral  Topiramate 25 MG Oral Tablet Oral  Triamterene-HCTZ 37.5-25 MG Oral Capsule Oral  Zantac     GU PSH: Endometrial Ablation - about 2007 Locm 300-399Mg /Ml Iodine,1Ml - 06/07/2016    NON-GU PSH: Cholecystectomy - 2013 Knee Arthroscopy/surgery Remove Tonsils - 2013    GU PMH: Calculus Ureter, Right, 62mm in the right UPJ on CT. - 06/11/2016, Calculus Ureter, Calculus of ureter - 2014 Gross hematuria (Acute), Left - 06/05/2016 Interstitial Cystitis, chronic w/o hematuria, Chronic Interstitial Cystitis - 2014 Kidney Stone, Nephrolithiasis - 2014 Renal Cysts, Simple, Renal cyst, acquired - 2014 Right lower quadrant pain, Abdominal pain, RLQ (right lower  quadrant) - 2014 Urinary Retention, Unspec, Urinary retention - 2014 Urinary Tract Inf, Unspec site, Urinary tract infection - 2014      PMH Notes:  2012-03-10 12:31:24 - Note: Gastroparesis   NON-GU PMH: Anuria and oliguria, Oliguria - 2014 Anxiety disorder, unspecified, Anxiety (Symptom) - 2014 Fibromyalgia, Fibromyalgia - 2014 Nausea, Nausea - 2014 Depression Gastro-esophageal reflux disease without esophagitis Gastroparesis Gout, unspecified Pure hypercholesterolemia, unspecified Sleep apnea, unspecified Unspecified osteoarthritis, unspecified site    FAMILY HISTORY: Asthma - Sister Family Health Status - Father's Age - Runs In Family Family Health Status - Mother's Age - Runs In Family Family Health Status Number - Runs In Family Heart Disease - Father Hypertension - Father, Mother, Runs in Family nephrolithiasis - Father Pure Hypercholesterolemia - Runs In Family   SOCIAL HISTORY: Marital Status: Married Current Smoking Status: Patient has never smoked.  Does not use drugs. Does not drink caffeine. Has not had a blood transfusion. Patient's occupation is/was retired.     Notes: Never A Smoker, Marital History - Currently Married, Caffeine Use, Alcohol Use   REVIEW OF SYSTEMS:    GU Review Female:   gross hematuria. Patient denies frequent urination, hard to postpone urination, burning /pain with urination, get up at night to urinate, leakage of urine, stream starts and stops, trouble starting your stream, have to strain to urinate, and currently pregnant.  Gastrointestinal (Upper):   Patient denies nausea, vomiting, and indigestion/ heartburn.  Gastrointestinal (Lower):   Patient denies diarrhea and constipation.  Constitutional:   Patient denies fever, night sweats,  weight loss, and fatigue.  Skin:   Patient denies skin rash/ lesion and itching.  Eyes:   Patient denies blurred vision and double vision.  Ears/ Nose/ Throat:   Patient denies sore throat and sinus  problems.  Hematologic/Lymphatic:   Patient denies swollen glands and easy bruising.  Cardiovascular:   Patient denies leg swelling and chest pains.  Respiratory:   Patient denies cough and shortness of breath.  Endocrine:   Patient denies excessive thirst.  Musculoskeletal:   Patient denies back pain and joint pain.  Neurological:   Patient denies headaches and dizziness.  Psychologic:   Patient denies depression and anxiety.   VITAL SIGNS:      06/27/2016 11:29 AM  BP 164/80 mmHg  Pulse 128 /min  Temperature 98.5 F / 37 C   MULTI-SYSTEM PHYSICAL EXAMINATION:    Constitutional: Well-nourished. No physical deformities. Normally developed. Good grooming.   Respiratory: No labored breathing, no use of accessory muscles.   Cardiovascular: Normal temperature, normal extremity pulses, no swelling, no varicosities.   Neurologic / Psychiatric: Oriented to time, oriented to place, oriented to person. No depression, no anxiety, no agitation.   Gastrointestinal: No mass, no tenderness, no rigidity, non obese abdomen.   Ears, Nose, Mouth, and Throat: Mallimapti class 3     PAST DATA REVIEWED:  Source Of History:  Patient  Records Review:   Previous Patient Records  X-Ray Review: KUB: Reviewed Films. Discussed With Patient.  C.T. Stone Protocol: Reviewed Films. Reviewed Report.     PROCEDURES:         KUB - 74000  A single view of the abdomen is obtained.  Calculi:  Lower pole right kidney calculi.      It appears that right UPJ calculus has falled back into right lower pole. No ureteral or left renal/ureteral calculus noted. This KUB was also reviewed by Dr. Jeffie Pollock and he concurs.   ASSESSMENT:      ICD-10 Details  1 GU:   Kidney Stone - N20.0 Right, Stable - It appears right UPJ stone has now fallen back into right lower pole. Will proceed with elective ESWL. I went over the procedure of extracorporal shockwave lithotripsy with the patient in quite detail. She understands the risk of  poor fragmentation resulting in further ureteral intervention. She also understands that possibility of needing additional procedures to clear stones. Finally, we discussed the risks of hematoma. The patient is willing to proceed.    PLAN:            Medications Stop Meds: Tamsulosin Hcl 0.4 mg capsule, ext release 24 hr 1 capsule PO Q PM  Start: 06/08/2016  Stop: 07/08/2016   Discontinue: 06/27/2016  - Reason: The patient suffered unacceptable side effects.            Document Letter(s):  Created for Patient: Clinical Summary   Created for Vikki Ports, MD         Notes:   Discussed with Dr. Jeffie Pollock. He recommends proceeding with elective (R) renal ESWL.

## 2016-07-05 ENCOUNTER — Encounter (HOSPITAL_COMMUNITY): Payer: Self-pay | Admitting: General Practice

## 2016-07-05 ENCOUNTER — Encounter (HOSPITAL_COMMUNITY)
Admission: RE | Disposition: A | Discharge status: Home / Self Care | Payer: Self-pay | Source: Ambulatory Visit | Attending: Urology

## 2016-07-05 ENCOUNTER — Ambulatory Visit (HOSPITAL_COMMUNITY): Payer: 59

## 2016-07-05 ENCOUNTER — Ambulatory Visit (HOSPITAL_COMMUNITY)
Admission: RE | Admit: 2016-07-05 | Discharge: 2016-07-05 | Disposition: A | Discharge status: Home / Self Care | Payer: 59 | Source: Ambulatory Visit | Attending: Urology | Admitting: Urology

## 2016-07-05 DIAGNOSIS — Z87442 Personal history of urinary calculi: Secondary | ICD-10-CM | POA: Insufficient documentation

## 2016-07-05 DIAGNOSIS — Z79899 Other long term (current) drug therapy: Secondary | ICD-10-CM | POA: Insufficient documentation

## 2016-07-05 DIAGNOSIS — M797 Fibromyalgia: Secondary | ICD-10-CM | POA: Insufficient documentation

## 2016-07-05 DIAGNOSIS — K219 Gastro-esophageal reflux disease without esophagitis: Secondary | ICD-10-CM | POA: Insufficient documentation

## 2016-07-05 DIAGNOSIS — F329 Major depressive disorder, single episode, unspecified: Secondary | ICD-10-CM | POA: Diagnosis not present

## 2016-07-05 DIAGNOSIS — E7800 Pure hypercholesterolemia, unspecified: Secondary | ICD-10-CM | POA: Insufficient documentation

## 2016-07-05 DIAGNOSIS — G4730 Sleep apnea, unspecified: Secondary | ICD-10-CM | POA: Insufficient documentation

## 2016-07-05 DIAGNOSIS — N200 Calculus of kidney: Secondary | ICD-10-CM | POA: Diagnosis present

## 2016-07-05 DIAGNOSIS — F419 Anxiety disorder, unspecified: Secondary | ICD-10-CM | POA: Insufficient documentation

## 2016-07-05 SURGERY — LITHOTRIPSY, ESWL
Anesthesia: LOCAL | Laterality: Right

## 2016-07-05 MED ORDER — SODIUM CHLORIDE 0.9 % IV SOLN
INTRAVENOUS | Status: DC
  Administered 2016-07-05: 07:00:00 via INTRAVENOUS

## 2016-07-05 MED ORDER — FENTANYL CITRATE (PF) 100 MCG/2ML IJ SOLN: 25.0000 ug | INTRAMUSCULAR | Status: DC | PRN

## 2016-07-05 MED ORDER — SODIUM CHLORIDE 0.9% FLUSH: 3.0000 mL | Freq: Two times a day (BID) | INTRAVENOUS | Status: DC

## 2016-07-05 MED ORDER — DIPHENHYDRAMINE HCL 25 MG PO CAPS
25.0000 mg | ORAL_CAPSULE | ORAL | Status: AC
  Administered 2016-07-05: 25 mg via ORAL
  Filled 2016-07-05: qty 1

## 2016-07-05 MED ORDER — DIAZEPAM 5 MG PO TABS
10.0000 mg | ORAL_TABLET | ORAL | Status: AC
  Administered 2016-07-05: 10 mg via ORAL
  Filled 2016-07-05: qty 2

## 2016-07-05 MED ORDER — SODIUM CHLORIDE 0.9 % IV SOLN: 250.0000 mL | INTRAVENOUS | Status: DC | PRN

## 2016-07-05 MED ORDER — CIPROFLOXACIN HCL 500 MG PO TABS
500.0000 mg | ORAL_TABLET | ORAL | Status: AC
  Administered 2016-07-05: 500 mg via ORAL
  Filled 2016-07-05: qty 1

## 2016-07-05 MED ORDER — OXYCODONE-ACETAMINOPHEN 5-325 MG PO TABS: 1.0000 | ORAL_TABLET | ORAL | 0 refills | Status: DC | PRN

## 2016-07-05 MED ORDER — SODIUM CHLORIDE 0.9% FLUSH: 3.0000 mL | INTRAVENOUS | Status: DC | PRN

## 2016-07-05 MED ORDER — ACETAMINOPHEN 325 MG PO TABS: 650.0000 mg | ORAL_TABLET | ORAL | Status: DC | PRN

## 2016-07-05 MED ORDER — ACETAMINOPHEN 650 MG RE SUPP
650.0000 mg | RECTAL | Status: DC | PRN
  Filled 2016-07-05: qty 1

## 2016-07-05 MED ORDER — OXYCODONE HCL 5 MG PO TABS: 5.0000 mg | ORAL_TABLET | ORAL | Status: DC | PRN

## 2016-07-05 NOTE — Discharge Instructions (Signed)
Lithotripsy, Care After °Refer to this sheet in the next few weeks. These instructions provide you with information on caring for yourself after your procedure. Your health care provider may also give you more specific instructions. Your treatment has been planned according to current medical practices, but problems sometimes occur. Call your health care provider if you have any problems or questions after your procedure. °WHAT TO EXPECT AFTER THE PROCEDURE  °· Your urine may have a red tinge for a few days after treatment. Blood loss is usually minimal. °· You may have soreness in the back or flank area. This usually goes away after a few days. The procedure can cause blotches or bruises on the back where the pressure wave enters the skin. These marks usually cause only minimal discomfort and should disappear in a short time. °· Stone fragments should begin to pass within 24 hours of treatment. However, a delayed passage is not unusual. °· You may have pain, discomfort, and feel sick to your stomach (nauseated) when the crushed fragments of stone are passed down the tube from the kidney to the bladder. Stone fragments can pass soon after the procedure and may last for up to 4-8 weeks. °· A small number of patients may have severe pain when stone fragments are not able to pass, which leads to an obstruction. °· If your stone is greater than 1 inch (2.5 cm) in diameter or if you have multiple stones that have a combined diameter greater than 1 inch (2.5 cm), you may require more than one treatment. °· If you had a stent placed prior to your procedure, you may experience some discomfort, especially during urination. You may experience the pain or discomfort in your flank or back, or you may experience a sharp pain or discomfort at the base of your penis or in your lower abdomen. The discomfort usually lasts only a few minutes after urinating. °HOME CARE INSTRUCTIONS  °· Rest at home until you feel your energy  improving. °· Only take over-the-counter or prescription medicines for pain, discomfort, or fever as directed by your health care provider. Depending on the type of lithotripsy, you may need to take antibiotics and anti-inflammatory medicines for a few days. °· Drink enough water and fluids to keep your urine clear or pale yellow. This helps "flush" your kidneys. It helps pass any remaining pieces of stone and prevents stones from coming back. °· Most people can resume daily activities within 1-2 days after standard lithotripsy. It can take longer to recover from laser and percutaneous lithotripsy. °· Strain all urine through the provided strainer. Keep all particulate matter and stones for your health care provider to see. The stone may be as small as a grain of salt. It is very important to use the strainer each and every time you pass your urine. Any stones that are found can be sent to a medical lab for examination. °· Visit your health care provider for a follow-up appointment in a few weeks. Your doctor may remove your stent if you have one. Your health care provider will also check to see whether stone particles still remain. °SEEK MEDICAL CARE IF:  °· Your pain is not relieved by medicine. °· You have a lasting nauseous feeling. °· You feel there is too much blood in the urine. °· You develop persistent problems with frequent or painful urination that does not at least partially improve after 2 days following the procedure. °· You have a congested cough. °· You feel   lightheaded. °· You develop a rash or any other signs that might suggest an allergic problem. °· You develop any reaction or side effects to your medicine(s). °SEEK IMMEDIATE MEDICAL CARE IF:  °· You experience severe back or flank pain or both. °· You see nothing but blood when you urinate. °· You cannot pass any urine at all. °· You have a fever or shaking chills. °· You develop shortness of breath, difficulty breathing, or chest pain. °· You  develop vomiting that will not stop after 6-8 hours. °· You have a fainting episode. °  °This information is not intended to replace advice given to you by your health care provider. Make sure you discuss any questions you have with your health care provider. °  °Document Released: 12/02/2007 Document Revised: 08/03/2015 Document Reviewed: 05/28/2013 °Elsevier Interactive Patient Education ©2016 [employer] ° °

## 2016-07-05 NOTE — Interval H&P Note (Signed)
History and Physical Interval Note:  Her stone is back in the UPJ.  07/05/2016 7:41 AM  [name]  has presented today for surgery, with the diagnosis of right lower pole stone  The various methods of treatment have been discussed with the patient and family. After consideration of risks, benefits and other options for treatment, the patient has consented to  Procedure(s): RIGHT EXTRACORPOREAL SHOCK WAVE LITHOTRIPSY (ESWL) (Right) as a surgical intervention .  The patient's history has been reviewed, patient examined, no change in status, stable for surgery.  I have reviewed the patient's chart and labs.  Questions were answered to the patient's satisfaction.     Tahje Borawski J

## 2016-07-12 ENCOUNTER — Telehealth: Payer: Self-pay | Admitting: *Deleted

## 2016-07-12 NOTE — Telephone Encounter (Signed)
I'd prefer OV, since she has many doctors, not all of whom are in the system--I want to be sure I know all of her current medications (to avoid polypharmacy or interactions).

## 2016-07-12 NOTE — Telephone Encounter (Signed)
Patient will call back in the morning to schedule.

## 2016-07-12 NOTE — Telephone Encounter (Signed)
Patient called and stated that she used to be on clonazepam for RLS. You wanted her to try without it and she has been off for a while but has really been having issues with her RLS. Would like to know if you will call in rx of clonazepam for her. I told her I would ask, but that she might need an appt.

## 2016-09-18 ENCOUNTER — Ambulatory Visit (INDEPENDENT_AMBULATORY_CARE_PROVIDER_SITE_OTHER): Payer: 59 | Admitting: Medical

## 2016-09-18 ENCOUNTER — Encounter: Payer: Self-pay | Admitting: Medical

## 2016-09-18 VITALS — BP 110/60 | HR 78 | Temp 98.2°F | Resp 16 | Wt 211.4 lb

## 2016-09-18 DIAGNOSIS — H8110 Benign paroxysmal vertigo, unspecified ear: Secondary | ICD-10-CM | POA: Diagnosis not present

## 2016-09-18 MED ORDER — MECLIZINE HCL 25 MG PO TABS: 25.0000 mg | ORAL_TABLET | Freq: Two times a day (BID) | ORAL | 0 refills | Status: DC

## 2016-09-18 NOTE — Progress Notes (Signed)
Subjective: Chief Complaint  Patient presents with  . Dizziness    pt states she has been sick with her gastroparesis. Went to dentist yesterday am and felt fine. Reports last night when she laid on bed had dizziness with some improvement. Reports some dizziness with head movement.   . Chest Pain    tightness in chest- states this is anxiety and took xanax with relief.    Here for dizziness, chest pain.  Currently feels like head is spinning like room spinning.  However last night was in kitchen standing, grabbed the bar due to head spinning then.   Had several episodes of similar yesterday, all like room spinning.  Ended up lying down.  As long as she didn't lean down or lean over, symptoms seem to get better.  The spinning sensation lasts for up to 5 minutes at a time.  Had some chest tightness that she attributed to anxiety.    Her anxiety pilled helped with this though.     Denies vision loss, hearing loss, no ringing in the ears, no numbness, no tingling, no weakness, no fall, but felt off balance.   No SOB, no palpations, no unilateal weakness/numbness.  Having some back pain, but has fibromyalgia, attributes to this.   No urinary issues.   Has had some loose stools in recent day.   No recent colds or URI symptoms.    No hx/o vertigo.    Past Medical History:  Diagnosis Date  . Anemia    previously followed by Dr. Jamse Arn for anemia and elevated platelets  . Anxiety   . C. difficile colitis 10/01/12   treated by [facility]  . Chronic fatigue syndrome   . DDD (degenerative disc disease), lumbar 08/19/14   and facet arthroplasty & left lumbar radiculopathy (Dr.Ramos)  . Depression   . Dyssynergia    dyssynergenic defecation, contributing to fecal incontinence.  . Edema   . Fibromyalgia   . Gastroparesis    followed at [facility]  . GERD (gastroesophageal reflux disease)   . Hyperlipidemia   . Kidney stone   . Lumbar radiculopathy   . Migraine   . Neuropathy ([place])   . Obstructive sleep  apnea    On Cpap 2009  . Paresthesia    Dr. Everette Rank at [facility]  . Pelvic floor dysfunction    pelvic floor dyssynergy  . Plantar fasciitis 02/2011   R foot  . Pneumonia    2012  . PONV (postoperative nausea and vomiting)    pt states has gastroparesis has difficulty taking antibiotics and narcotics has severe nausea and vomiting   . Restless leg syndrome   . S/P endometrial ablation 08/09/2006   Novasure Ablation  . S/P epidural steroid injection 09/20/14   Dr.Ramos  . Shortness of breath dyspnea    bending over; exertion   . Tremor    Dr. Everette Rank  . Urinary frequency   . Urinary incontinence    Past Surgical History:  Procedure Laterality Date  . CHOLECYSTECTOMY  9/05  . ENDOMETRIAL ABLATION  08/09/2006   Dr. Valentina Shaggy Ablation  . KNEE SURGERY  1999   R knee, Dr. Eddie Dibbles, torn cartilage  . TONSILLECTOMY  1968  . TONSILLECTOMY    . TOTAL KNEE ARTHROPLASTY Right 09/05/2015   Procedure: RIGHT TOTAL KNEE ARTHROPLASTY;  Surgeon: Gaynelle Arabian, MD;  Location: [facility];  Service: Orthopedics;  Laterality: Right;   Current Outpatient Prescriptions on File Prior to Visit  Medication Sig Dispense Refill  .  acetaminophen (TYLENOL) 500 MG tablet Take 1,000 mg by mouth every 6 (six) hours as needed for mild pain. Reported on 01/02/2016    . albuterol (PROVENTIL HFA;VENTOLIN HFA) 108 (90 Base) MCG/ACT inhaler Inhale into the lungs every 6 (six) hours as needed for wheezing or shortness of breath. Reported on 05/30/2016    . ALPRAZolam (XANAX) 0.25 MG tablet Take 0.25 mg by mouth 3 (three) times daily as needed for anxiety.     . Armodafinil 250 MG tablet Take 250 mg by mouth daily.     Marland Kitchen atorvastatin (LIPITOR) 40 MG tablet TAKE 1 TABLET (40 MG TOTAL) BY MOUTH DAILY. 90 tablet 1  . cholecalciferol (VITAMIN D) 1000 units tablet Take 1,000 Units by mouth daily.    Marland Kitchen dronabinol (MARINOL) 5 MG capsule Take 5 mg by mouth daily before lunch.     . DULoxetine (CYMBALTA) 60 MG capsule      . fluticasone (FLONASE) 50 MCG/ACT nasal spray Place 2 sprays into both nostrils daily. 16 g 11  . gabapentin (NEURONTIN) 300 MG capsule Take 1 capsule (300 mg total) by mouth at bedtime. 30 capsule 3  . metoprolol (LOPRESSOR) 50 MG tablet Take 1 tablet (50 mg total) by mouth 2 (two) times daily. 60 tablet 10  . Multiple Vitamins-Minerals (PRESERVISION AREDS PO) Take 1 capsule by mouth daily. Reported on 01/02/2016    . nortriptyline (PAMELOR) 50 MG capsule Take 150 mg by mouth at bedtime.    . ondansetron (ZOFRAN) 8 MG tablet Take 8 mg by mouth every 8 (eight) hours as needed for nausea or vomiting. Reported on 05/09/2016    . pantoprazole (PROTONIX) 40 MG tablet Take 40 mg by mouth 2 (two) times daily.    . Probiotic Product ([place]) CAPS Take 1 capsule by mouth daily. Reported on 01/02/2016    . prochlorperazine (COMPAZINE) 10 MG tablet Take 10 mg by mouth every 6 (six) hours as needed for nausea or vomiting. Reported on 05/09/2016    . promethazine (PHENERGAN) 25 MG suppository Place 25 mg rectally every 6 (six) hours as needed for nausea or vomiting. Reported on 05/09/2016    . topiramate (TOPAMAX) 25 MG tablet Take 50 mg by mouth at bedtime.  1  . hyoscyamine (ANASPAZ) 0.125 MG TBDP disintergrating tablet Place 0.125 mg under the tongue.    Marland Kitchen oxyCODONE-acetaminophen (ROXICET) 5-325 MG tablet Take 1 tablet by mouth every 4 (four) hours as needed for severe pain. (Patient not taking: Reported on 09/18/2016) 20 tablet 0  . ranitidine (ZANTAC) 300 MG capsule Take 300 mg by mouth.    . [DISCONTINUED] carbamazepine (TEGRETOL XR) 100 MG 12 hr tablet Take 200 mg by mouth daily.      . [DISCONTINUED] Eszopiclone 3 MG TABS Take 3 mg by mouth at bedtime. Take immediately before bedtime     No current facility-administered medications on file prior to visit.     ROS as in subjective   Objective: BP 110/60   Pulse 78   Temp 98.2 F (36.8 C) (Oral)   Resp 16   Wt 211 lb 6.4 oz (95.9  kg)   LMP 07/27/2006   BMI 38.05 kg/m   BP Readings from Last 3 Encounters:  09/18/16 110/60  07/05/16 (!) 132/56  05/30/16 110/70    General appearance: alert, no distress, WD/WN HEENT: normocephalic, sclerae anicteric, PERRLA, EOMi, nares patent, no discharge or erythema, pharynx normal Oral cavity: MMM, no lesions Neck: supple, no lymphadenopathy, no thyromegaly, no masses,  no bruits Heart: RRR, normal S1, S2, no murmurs Lungs: CTA bilaterally, no wheezes, rhonchi, or rales Extremities: no edema, no cyanosis, no clubbing Pulses: 2+ symmetric, upper and lower extremities, normal cap refill Neurological: +unable to do heel to toe walk due to dizziness, but otherwise alert, oriented x 3, CN2-12 intact, strength normal upper extremities and lower extremities, sensation normal throughout, DTRs 2+ throughout,  gait normal Psychiatric: normal affect, behavior normal, pleasant    Reviewed 07/2015 echo, there was some LVH, but EF 60-65%, atrial enlargement.   07/2015 myocardial perfusion scan - normal.    Reviewed 11/2015 brain MRI   Assessment: Encounter Diagnosis  Name Primary?  . Benign paroxysmal positional vertigo, unspecified laterality Yes      Plan: Discussed symptoms, exam, and etiology most likely BPPV.  Begin meclizine short term BID, discussed risks/benefits of medication, caution if sedation.  advised not to make sudden motions, don't be bending over, no driving if feeling dizzy.   If not seeing improvement in the next 72 hours, then call back.  If new or other symptoms, particular unilateral weakness, numbers, tingling, chest pain, edema, slurred speech or other get evaluated right away/call 911.   F/u 1wk.   Kattie was seen today for dizziness and chest pain.  Diagnoses and all orders for this visit:  Benign paroxysmal positional vertigo, unspecified laterality  Other orders -     meclizine (ANTIVERT) 25 MG tablet; Take 1 tablet (25 mg total) by mouth 2 (two)  times daily. For vertigo/dizzy

## 2016-10-22 ENCOUNTER — Ambulatory Visit (INDEPENDENT_AMBULATORY_CARE_PROVIDER_SITE_OTHER): Payer: 59 | Admitting: Family Medicine

## 2016-10-22 ENCOUNTER — Encounter: Payer: Self-pay | Admitting: Family Medicine

## 2016-10-22 VITALS — BP 130/70 | HR 80 | Temp 100.3°F | Ht 62.75 in | Wt 209.6 lb

## 2016-10-22 DIAGNOSIS — R05 Cough: Secondary | ICD-10-CM | POA: Diagnosis not present

## 2016-10-22 DIAGNOSIS — R509 Fever, unspecified: Secondary | ICD-10-CM

## 2016-10-22 DIAGNOSIS — R059 Cough, unspecified: Secondary | ICD-10-CM

## 2016-10-22 DIAGNOSIS — J069 Acute upper respiratory infection, unspecified: Secondary | ICD-10-CM | POA: Diagnosis not present

## 2016-10-22 DIAGNOSIS — J029 Acute pharyngitis, unspecified: Secondary | ICD-10-CM

## 2016-10-22 LAB — POC INFLUENZA A&B (BINAX/QUICKVUE)
Influenza A, POC: NEGATIVE
Influenza B, POC: NEGATIVE

## 2016-10-22 LAB — POCT RAPID STREP A (OFFICE): Rapid Strep A Screen: NEGATIVE

## 2016-10-22 NOTE — Progress Notes (Signed)
Chief Complaint  Patient presents with  . Sore Throat    started yesterday am but also mentioned that she did have a little fall last night and she fell into a card table and caught her throat on the table. Had a temp last night. Has a cough. Some drainage.    2-3 days ago she started with a scratchy throat and runny nose (thought it was allergies, doing [employer], dusty).  Yesterday she noticed that her throat was more painful, and this morning was really bad. Denies cough, ear pain, sinus pain currently. She had some sinus pressure in her cheeks a couple of days ago.  Last night she developed fever, low grade (99). Nasal drainage remains clear.  She has had a slight cough also since the beginning, usually nonproductive (clear if any phlegm).  Denies sick contacts.  She has been using Mucinex (liquid) with some benefit.  Having ongoing GI issues (dysphagia). Scheduled for esophageal manometry and pH probe later this week.  PMH, PSH, SH reviewed.  Outpatient Encounter Prescriptions as of 10/22/2016  Medication Sig Note  . acetaminophen (TYLENOL) 500 MG tablet Take 1,000 mg by mouth every 6 (six) hours as needed for mild pain. Reported on 01/02/2016   . ALPRAZolam (XANAX) 0.25 MG tablet Take 0.25 mg by mouth 3 (three) times daily as needed for anxiety.  04/16/2016: Takes one daily, usually  . Armodafinil 250 MG tablet Take 250 mg by mouth daily.  04/16/2016: Received from: External Pharmacy  . atorvastatin (LIPITOR) 40 MG tablet TAKE 1 TABLET (40 MG TOTAL) BY MOUTH DAILY.   . cetirizine (ZYRTEC) 10 MG tablet Take 10 mg by mouth at bedtime.   . cholecalciferol (VITAMIN D) 1000 units tablet Take 1,000 Units by mouth daily.   Marland Kitchen Dextromethorphan-Guaifenesin (MUCINEX FAST-MAX DM MAX) 5-100 MG/5ML LIQD Take 30 mLs by mouth every 4 (four) hours.   . DULoxetine (CYMBALTA) 60 MG capsule  05/17/2016: Received from: External Pharmacy  . fluticasone (FLONASE) 50 MCG/ACT nasal spray Place 2 sprays  into both nostrils daily.   Marland Kitchen gabapentin (NEURONTIN) 300 MG capsule Take 1 capsule (300 mg total) by mouth at bedtime. 07/03/2016: Currently taking this  . meloxicam (MOBIC) 15 MG tablet  09/18/2016: Received from: External Pharmacy  . metoprolol (LOPRESSOR) 50 MG tablet Take 1 tablet (50 mg total) by mouth 2 (two) times daily.   . Multiple Vitamins-Minerals (PRESERVISION AREDS PO) Take 1 capsule by mouth daily. Reported on 01/02/2016 12/22/2015: Pt's personal med lists an "I Caps" supplement.    . nortriptyline (PAMELOR) 50 MG capsule Take 150 mg by mouth at bedtime.   . pantoprazole (PROTONIX) 40 MG tablet Take 40 mg by mouth 2 (two) times daily.   . Probiotic Product ([place]) CAPS Take 1 capsule by mouth daily. Reported on 01/02/2016   . topiramate (TOPAMAX) 25 MG tablet Take 50 mg by mouth at bedtime. 05/09/2016: Received from: External Pharmacy  . albuterol (PROVENTIL HFA;VENTOLIN HFA) 108 (90 Base) MCG/ACT inhaler Inhale into the lungs every 6 (six) hours as needed for wheezing or shortness of breath. Reported on 05/30/2016   . dronabinol (MARINOL) 5 MG capsule Take 5 mg by mouth daily before lunch.  09/18/2016: prn  . hyoscyamine (ANASPAZ) 0.125 MG TBDP disintergrating tablet Place 0.125 mg under the tongue. 09/18/2016: Not taking  . meclizine (ANTIVERT) 25 MG tablet Take 1 tablet (25 mg total) by mouth 2 (two) times daily. For vertigo/dizzy (Patient not taking: Reported on 10/22/2016)   .  ondansetron (ZOFRAN) 8 MG tablet Take 8 mg by mouth every 8 (eight) hours as needed for nausea or vomiting. Reported on 05/09/2016   . oxyCODONE-acetaminophen (ROXICET) 5-325 MG tablet Take 1 tablet by mouth every 4 (four) hours as needed for severe pain. (Patient not taking: Reported on 10/22/2016)   . prochlorperazine (COMPAZINE) 10 MG tablet Take 10 mg by mouth every 6 (six) hours as needed for nausea or vomiting. Reported on 05/09/2016   . promethazine (PHENERGAN) 25 MG suppository Place 25 mg  rectally every 6 (six) hours as needed for nausea or vomiting. Reported on 05/09/2016   . ranitidine (ZANTAC) 300 MG capsule Take 300 mg by mouth. 05/09/2016: Received from: [facility]   No facility-administered encounter medications on file as of 10/22/2016.    Allergies  Allergen Reactions  . Erythromycin Nausea Only  . Tramadol Itching  . Dilaudid [Hydromorphone Hcl] Itching   ROS: low grade fever as above.  No dizziness, chest pain, shortness of breath.  +dysphagia, cough and sore throat as per HPI. Nausea per her usual. Was gagging after strep test in the exam room.  Bowels unchanged. No bleeding, bruising, rash  PHYSICAL EXAM:  BP 130/70 (BP Location: Left Arm, Patient Position: Sitting, Cuff Size: Normal)   Pulse 80   Temp 100.3 F (37.9 C)   Ht 5' 2.75" (1.594 m)   Wt 209 lb 9.6 oz (95.1 kg)   LMP 07/27/2006   BMI 37.43 kg/m   Well appearing female in no distress Constant sniffling and occasional cough during visit. HEENT: PERRL, EOMI, conjunctiva and sclera are clear. Nasal mucosa is mildly edematous, pale.  Some yellow crusting bilaterally, and recent bleeding on the left. OP is clear without significant erythema, mild cobblestoning. Neck: no lymphadenopathy or mass Heart: regular rate and rhythm Lungs: clear bilaterally, no wheezes, rales, ronchi Neuro: alert and oriented, cranial nerves intact. Normal strength, gait Psych: normal mood, affect, hygiene and grooming Skin: normal turgor, no rash   Rapid strep negative Flu tests negative  ASSESSMENT/PLAN:  Acute upper respiratory infection - suspect viral. Supportive measures reviewed, and s/sx infection for which she should f/u  Sore throat - Plan: Rapid Strep A, POC Influenza A&B (Binax test)  Fever, unspecified fever cause - Plan: POC Influenza A&B (Binax test)  Cough - Plan: POC Influenza A&B (Binax test)    Drink plenty of water. Use nasal saline spray frequently to keep the  mucosa moist (which helps prevent bleeding, and keep the crusting and mucus thinner). You may continue to use the mucinex that you have--read the label and use it as often as needed (not more than stated on the bottle). Be sure to check the label--if it contains acetaminophen, don't use a separate tylenol. Continue to use Flonase and cetirizine.  Return if you have persistent high fevers, worsening cough or shortness of breath. Contact us if your mucus becomes discolored (not just first thing in the morning, but persistent throughout the day).

## 2016-10-22 NOTE — Patient Instructions (Signed)
  Drink plenty of water. Use nasal saline spray frequently to keep the mucosa moist (which helps prevent bleeding, and keep the crusting and mucus thinner). You may continue to use the mucinex that you have--read the label and use it as often as needed (not more than stated on the bottle). Be sure to check the label--if it contains acetaminophen, don't use a separate tylenol. Continue to use Flonase and cetirizine.  Return if you have persistent high fevers, worsening cough or shortness of breath. Contact us if your mucus becomes discolored (not just first thing in the morning, but persistent throughout the day).

## 2016-10-24 ENCOUNTER — Ambulatory Visit: Payer: 59 | Admitting: Rheumatology

## 2016-11-18 ENCOUNTER — Other Ambulatory Visit: Payer: Self-pay | Admitting: Family Medicine

## 2016-11-18 DIAGNOSIS — J309 Allergic rhinitis, unspecified: Secondary | ICD-10-CM

## 2016-12-17 ENCOUNTER — Telehealth: Payer: Self-pay | Admitting: *Deleted

## 2016-12-17 NOTE — Telephone Encounter (Signed)
Patient advised and will schedule appt.

## 2016-12-17 NOTE — Telephone Encounter (Signed)
Big toe joint is still painful and red, she made an appt for Wed to discuss (there have not been any dietary changes that she can think of). She does have colchicine but has not taken since Saturday, it did help when she took one. Wants to know if it is okay to take until she comes to see you Wednesday and should she take advil as well or just the colchicine? Please advise.

## 2016-12-17 NOTE — Telephone Encounter (Signed)
She can take the colchicine again-- Directions for colchicine are 2 tablets (0.6mg  tablet) at onset of pain, then can take an additional tablet an hour later, if needed.  She can do this today, and also use the advil until Wednesday, if needed.

## 2016-12-17 NOTE — Telephone Encounter (Signed)
Her uric acid levels have been 6-6.4 not on meds.  This is within the normal range, which is why, without a history of frequent flares, preventative medications hadn't been started. They should NOT be started during an acute flare (can make it worse).    Was there any particular trigger that she can think of to flare it? (change in diet, etc).  If so, avoidance of triggers would be preferable to being on a long-term medication (since she takes so many medications).  I recommend she schedule OV to discuss gout sometime in the next week, for [place] to review options, discuss medications, etc.

## 2016-12-17 NOTE — Telephone Encounter (Signed)
Patient advised.

## 2016-12-17 NOTE — Telephone Encounter (Signed)
Patient called and had a gout flare of her foot this weekend, had a few pills left from hospital visit last year. States that she called the on call this weekend here and spoke with Dr.Lalonde. She thought she as on something preventative for the gout but she is not, wanting to get on something for this preventatively and then asked how long she should be taking the acute meds for as well? Please advise.

## 2016-12-19 ENCOUNTER — Ambulatory Visit (INDEPENDENT_AMBULATORY_CARE_PROVIDER_SITE_OTHER): Payer: 59 | Admitting: Family Medicine

## 2016-12-19 ENCOUNTER — Encounter: Payer: Self-pay | Admitting: Family Medicine

## 2016-12-19 VITALS — BP 110/70 | HR 72 | Ht 62.75 in | Wt 215.2 lb

## 2016-12-19 DIAGNOSIS — M109 Gout, unspecified: Secondary | ICD-10-CM | POA: Diagnosis not present

## 2016-12-19 DIAGNOSIS — B351 Tinea unguium: Secondary | ICD-10-CM | POA: Diagnosis not present

## 2016-12-19 DIAGNOSIS — K219 Gastro-esophageal reflux disease without esophagitis: Secondary | ICD-10-CM | POA: Diagnosis not present

## 2016-12-19 MED ORDER — COLCHICINE 0.6 MG PO TABS
ORAL_TABLET | ORAL | 0 refills | Status: DC
Start: 2016-12-19 — End: 2019-07-08

## 2016-12-19 NOTE — Progress Notes (Signed)
Chief Complaint  Patient presents with  . Gout    consult re her gout.    Patient presents to follow up recent gout flare in her right toe.   She was given a prescription for colchicine a year ago (had a flare prior to discharge from hospital).  She reports directions stated to take once daily. She did that initially, but has not taken in a long time, until she took it based on Dr. Lanice Shirts recommendation.  Saturday and Sunday (3 and 4 days ago) she noted the red, swollen joint.  Yesterday morning she had some swelling also, but has resolved.  With recent flare (over the weekend) she spoke with Dr. Redmond School who recommended she take 1 colchicine BID (she had NOT been taking any for many months), along with 800mg  ibuprofen.  The following day the pain and redness was better, but had recurrent pain later at night (Sunday night).  She took colchicine Sunday morning, didn't take one that night, and that is when pain recurred.  She called on Monday due to ongoing gout flare pain, asking about preventative medications (and she was asked to schedule appointment).  She took 2 colchicine, followed by an additional pill for recurrent pain on Monday, as directed by me (per phone call)--this helped.  Has pain with any prolonged standing, feels better with her foot elevated. She took ibuprofen 800mg  TID yesterday, and 600mg  so far today.  It seems to be improving.  Stomach has tolerated the NSAIDs.  Denies abdominal pain.  She had one milder flare during the year, otherwise only other flare was in the hospital last year.  Denies changes in diet. Upon reviewing the list of high purine foods, she does report she had some ice cream sandwiches last week.  She had a tough year related to her gastroparesis, she is being sent to another specialist, Dr. Roney Mans, within the [facility] practice, for abnormalities on manometry study. She had endoscopy, manometry and pH probe in December. She reports had ongoing reflux (11% of the  time) despite her current regimen.  Nauseated more last night and today from her baseline.  Last night she had reflux up into her throat/mouth.   PMH, PSH, SH reviewed  Outpatient Encounter Prescriptions as of 12/19/2016  Medication Sig Note  . acetaminophen (TYLENOL) 500 MG tablet Take 1,000 mg by mouth every 6 (six) hours as needed for mild pain. Reported on 01/02/2016   . ALPRAZolam (XANAX) 0.25 MG tablet Take 0.25 mg by mouth 3 (three) times daily as needed for anxiety.  04/16/2016: Takes one daily, usually  . Armodafinil 250 MG tablet Take 250 mg by mouth daily.  04/16/2016: Received from: External Pharmacy  . atorvastatin (LIPITOR) 40 MG tablet TAKE 1 TABLET (40 MG TOTAL) BY MOUTH DAILY.   . cetirizine (ZYRTEC) 10 MG tablet Take 10 mg by mouth at bedtime.   . cholecalciferol (VITAMIN D) 1000 units tablet Take 1,000 Units by mouth daily.   . colchicine 0.6 MG tablet Take 2 tablets by mouth at onset of gout flare. Taken an additional tablet 1 hour later, if needed. May take like this every 3d, if needed   . DULoxetine (CYMBALTA) 60 MG capsule  05/17/2016: Received from: External Pharmacy  . fluticasone (FLONASE) 50 MCG/ACT nasal spray PLACE 2 SPRAYS INTO BOTH NOSTRILS DAILY.   Marland Kitchen gabapentin (NEURONTIN) 300 MG capsule Take 1 capsule (300 mg total) by mouth at bedtime. 07/03/2016: Currently taking this  . metoprolol (LOPRESSOR) 50 MG tablet Take 1  tablet (50 mg total) by mouth 2 (two) times daily.   . Multiple Vitamins-Minerals (PRESERVISION AREDS PO) Take 1 capsule by mouth daily. Reported on 01/02/2016 12/22/2015: Pt's personal med lists an "I Caps" supplement.    . nortriptyline (PAMELOR) 50 MG capsule Take 150 mg by mouth at bedtime.   . pantoprazole (PROTONIX) 40 MG tablet Take 40 mg by mouth 2 (two) times daily.   . Probiotic Product ([place]) CAPS Take 1 capsule by mouth daily. Reported on 01/02/2016   . prochlorperazine (COMPAZINE) 10 MG tablet Take 10 mg by mouth every 6 (six) hours  as needed for nausea or vomiting. Reported on 05/09/2016   . topiramate (TOPAMAX) 25 MG tablet Take 50 mg by mouth at bedtime. 05/09/2016: Received from: External Pharmacy  . [DISCONTINUED] colchicine 0.6 MG tablet Take 0.6 mg by mouth daily.   Marland Kitchen albuterol (PROVENTIL HFA;VENTOLIN HFA) 108 (90 Base) MCG/ACT inhaler Inhale into the lungs every 6 (six) hours as needed for wheezing or shortness of breath. Reported on 05/30/2016   . Dextromethorphan-Guaifenesin (MUCINEX FAST-MAX DM MAX) 5-100 MG/5ML LIQD Take 30 mLs by mouth every 4 (four) hours.   Marland Kitchen dronabinol (MARINOL) 5 MG capsule Take 5 mg by mouth daily before lunch.  09/18/2016: prn  . hyoscyamine (ANASPAZ) 0.125 MG TBDP disintergrating tablet Place 0.125 mg under the tongue. 09/18/2016: Not taking  . meclizine (ANTIVERT) 25 MG tablet Take 1 tablet (25 mg total) by mouth 2 (two) times daily. For vertigo/dizzy (Patient not taking: Reported on 10/22/2016)   . meloxicam (MOBIC) 15 MG tablet  09/18/2016: Received from: External Pharmacy  . ondansetron (ZOFRAN) 8 MG tablet Take 8 mg by mouth every 8 (eight) hours as needed for nausea or vomiting. Reported on 05/09/2016   . oxyCODONE-acetaminophen (ROXICET) 5-325 MG tablet Take 1 tablet by mouth every 4 (four) hours as needed for severe pain. (Patient not taking: Reported on 10/22/2016)   . promethazine (PHENERGAN) 25 MG suppository Place 25 mg rectally every 6 (six) hours as needed for nausea or vomiting. Reported on 05/09/2016   . [DISCONTINUED] ranitidine (ZANTAC) 300 MG capsule Take 300 mg by mouth. 05/09/2016: Received from: [facility]   No facility-administered encounter medications on file as of 12/19/2016.    Allergies  Allergen Reactions  . Erythromycin Nausea Only  . Tramadol Itching  . Dilaudid [Hydromorphone Hcl] Itching   ROS: nausea per HPI.  No fever, chills, abominal pain, bleeding, bruising, rash.  +toe pain as per HPI.  Some thickening of right great toenail, denies  ingrowing nail or pain.  Denies URI symptoms, shortness of breath.  Chest pain only related to reflux (rare).  See HPI  PHYSICAL EXAM:  BP 110/70 (BP Location: Left Arm, Patient Position: Sitting, Cuff Size: Normal)   Pulse 72   Ht 5' 2.75" (1.594 m)   Wt 215 lb 3.2 oz (97.6 kg)   LMP 07/27/2006   BMI 38.43 kg/m   Well appearing, pleasant, obese female in no distress Examination of right foot--no erythema, swelling, warmth.  No pain with ROM of 1st MTP (location of her pain).  2+ pulses, no edema Discoloration and slight thickening of right 1st great toenail. This has a jagged edge, with medial portion cut fairly low.  Skin is somewhat thickened/firm, but no tenderness or evidence of ingrowing nail (but is at risk).  Lab Results  Component Value Date   LABURIC 6.4 05/30/2016     ASSESSMENT/PLAN:  Acute gout involving toe of  right foot, unspecified cause - rare flare, and last uric acid 6.4; trial dietary measures. Start allopurinol if recurrent flares or uric a. elevated at f/u - Plan: colchicine 0.6 MG tablet  Gastroesophageal reflux disease, esophagitis presence not specified  Onychomycosis - asymptomatic, but at risk for ingrowing nail; discussed cutting nail straight across, s/sx of ingrowing nail  Discussed allopurinol and uloric.  If any treatment is needed, would do low dose allopurinol (assuming uric acid is still fairly low), 100mg . She has no contraindications for allopurinol.  She is on many meds, which is why I'd prefer to avoid chronic therapy with allopurinol if she can avoid flares with proper diet (and if uric acid is low when checked next month). Discussed reasons for not starting during a flare (or checking level during flare).  Discussed proper way to take colchicine, NSAID's.  Diet reviewed in detail, and supportive measures reviewed. Appears to have responded quite well to the colchicine and NSAIDs recommended.  25 min visit, more than 1/2 spent  counseling. Answered many questions re: reflux, gout, foot/nailcare.   Try and read through the material provided to follow a low-purine diet. It is possible that some of the ice cream and other foods might have triggered this flare.  Since your flares have been so infrequent, and uric acid level wasn't elevated on last check, let's avoid starting a daily medication right now--if you have ongoing problems or frequent flares, then we can definitely start it (or if you uric acid level is much higher when we recheck at your visit in February).  For now, I would continue ibuprofen 800mg  three times daily with food, to keep the inflammation in the joint down.  If not tolerating 800mg , you can back down to 600mg  (3 tablets)--if needed you can take this every 6 hours (4x/day)--the 800mg  can only be taken 3 times daily (max dose of 12 OTC ibuprofen daily)  Do not take the colchicine ongoing--use it just if needed for acute flare (when red/swollen/hot and not responding to the ibuprofen that you're taking).  Follow up as scheduled next month.

## 2016-12-19 NOTE — Patient Instructions (Addendum)
Low-Purine Diet Purines are compounds that affect the level of uric acid in your body. A low-purine diet is a diet that is low in purines. Eating a low-purine diet can prevent the level of uric acid in your body from getting too high and causing gout or kidney stones or both. What do I need to know about this diet?  Choose low-purine foods. Examples of low-purine foods are listed in the next section.  Drink plenty of fluids, especially water. Fluids can help remove uric acid from your body. Try to drink 8-16 cups (1.9-3.8 L) a day.  Limit foods high in fat, especially saturated fat, as fat makes it harder for the body to get rid of uric acid. Foods high in saturated fat include pizza, cheese, ice cream, whole milk, fried foods, and gravies. Choose foods that are lower in fat and lean sources of protein. Use olive oil when cooking as it contains healthy fats that are not high in saturated fat.  Limit alcohol. Alcohol interferes with the elimination of uric acid from your body. If you are having a gout attack, avoid all alcohol.  Keep in mind that different people's bodies react differently to different foods. You will probably learn over time which foods do or do not affect you. If you discover that a food tends to cause your gout to flare up, avoid eating that food. You can more freely enjoy foods that do not cause problems. If you have any questions about a food item, talk to your dietitian or health care provider. Which foods are low, moderate, and high in purines? The following is a list of foods that are low, moderate, and high in purines. You can eat any amount of the foods that are low in purines. You may be able to have small amounts of foods that are moderate in purines. Ask your health care provider how much of a food moderate in purines you can have. Avoid foods high in purines. Grains  Foods low in purines: Enriched white bread, pasta, rice, cake, cornbread, popcorn.  Foods moderate in  purines: Whole-grain breads and cereals, wheat germ, bran, oatmeal. Uncooked oatmeal. Dry wheat bran or wheat germ.  Foods high in purines: Pancakes, French toast, biscuits, muffins. Vegetables  Foods low in purines: All vegetables, except those that are moderate in purines.  Foods moderate in purines: Asparagus, cauliflower, spinach, mushrooms, green peas. Fruits  All fruits are low in purines. Meats and other Protein Foods  Foods low in purines: Eggs, nuts, peanut butter.  Foods moderate in purines: 80-90% lean beef, lamb, veal, pork, poultry, fish, eggs, peanut butter, nuts. Crab, lobster, oysters, and shrimp. Cooked dried beans, peas, and lentils.  Foods high in purines: Anchovies, sardines, herring, mussels, tuna, codfish, scallops, trout, and haddock. Bacon. Organ meats (such as liver or kidney). Tripe. Game meat. Goose. Sweetbreads. Dairy  All dairy foods are low in purines. Low-fat and fat-free dairy products are best because they are low in saturated fat. Beverages  Drinks low in purines: Water, carbonated beverages, tea, coffee, cocoa.  Drinks moderate in purines: Soft drinks and other drinks sweetened with high-fructose corn syrup. Juices. To find whether a food or drink is sweetened with high-fructose corn syrup, look at the ingredients list.  Drinks high in purines: Alcoholic beverages (such as beer). Condiments  Foods low in purines: Salt, herbs, olives, pickles, relishes, vinegar.  Foods moderate in purines: Butter, margarine, oils, mayonnaise. Fats and Oils  Foods low in purines: All types, except gravies   and sauces made with meat.  Foods high in purines: Gravies and sauces made with meat. Other Foods  Foods low in purines: Sugars, sweets, gelatin. Cake. Soups made without meat.  Foods moderate in purines: Meat-based or fish-based soups, broths, or bouillons. Foods and drinks sweetened with high-fructose corn syrup.  Foods high in purines: High-fat desserts  (such as ice cream, cookies, cakes, pies, doughnuts, and chocolate). Contact your dietitian for more information on foods that are not listed here.  This information is not intended to replace advice given to you by your health care provider. Make sure you discuss any questions you have with your health care provider. Document Released: 03/09/2011 Document Revised: 04/19/2016 Document Reviewed: 10/19/2013 Elsevier Interactive Patient Education  2017 [place]. Gout Gout is painful swelling that can occur in some of your joints. Gout is a type of arthritis. This condition is caused by having too much uric acid in your body. Uric acid is a chemical that forms when your body breaks down substances called purines. Purines are important for building body proteins. When your body has too much uric acid, sharp crystals can form and build up inside your joints. This causes pain and swelling. Gout attacks can happen quickly and be very painful (acute gout). Over time, the attacks can affect more joints and become more frequent (chronic gout). Gout can also cause uric acid to build up under your skin and inside your kidneys. What are the causes? This condition is caused by too much uric acid in your blood. This can occur because:  Your kidneys do not remove enough uric acid from your blood. This is the most common cause.  Your body makes too much uric acid. This can occur with some cancers and cancer treatments. It can also occur if your body is breaking down too many red blood cells (hemolytic anemia).  You eat too many foods that are high in purines. These foods include organ meats and some seafood. Alcohol, especially beer, is also high in purines. A gout attack may be triggered by trauma or stress. What increases the risk? This condition is more likely to develop in people who:  Have a family history of gout.  Are female and middle-aged.  Are female and have gone through menopause.  Are  obese.  Frequently drink alcohol, especially beer.  Are dehydrated.  Lose weight too quickly.  Have an organ transplant.  Have lead poisoning.  Take certain medicines, including aspirin, cyclosporine, diuretics, levodopa, and niacin.  Have kidney disease or psoriasis. What are the signs or symptoms? An attack of acute gout happens quickly. It usually occurs in just one joint. The most common place is the big toe. Attacks often start at night. Other joints that may be affected include joints of the feet, ankle, knee, fingers, wrist, or elbow. Symptoms may include:  Severe pain.  Warmth.  Swelling.  Stiffness.  Tenderness. The affected joint may be very painful to touch.  Shiny, red, or purple skin.  Chills and fever. Chronic gout may cause symptoms more frequently. More joints may be involved. You may also have white or yellow lumps (tophi) on your hands or feet or in other areas near your joints. How is this diagnosed? This condition is diagnosed based on your symptoms, medical history, and physical exam. You may have tests, such as:  Blood tests to measure uric acid levels.  Removal of joint fluid with a needle (aspiration) to look for uric acid crystals.  X-rays to look  for joint damage. How is this treated? Treatment for this condition has two phases: treating an acute attack and preventing future attacks. Acute gout treatment may include medicines to reduce pain and swelling, including:  NSAIDs.  Steroids. These are strong anti-inflammatory medicines that can be taken by mouth (orally) or injected into a joint.  Colchicine. This medicine relieves pain and swelling when it is taken soon after an attack. It can be given orally or through an IV tube. Preventive treatment may include:  Daily use of smaller doses of NSAIDs or colchicine.  Use of a medicine that reduces uric acid levels in your blood.  Changes to your diet. You may need to see a specialist about  healthy eating (dietitian). Follow these instructions at home: During a Gout Attack  If directed, apply ice to the affected area:  Put ice in a plastic bag.  Place a towel between your skin and the bag.  Leave the ice on for 20 minutes, 2-3 times a day.  Rest the joint as much as possible. If the affected joint is in your leg, you may be given crutches to use.  Raise (elevate) the affected joint above the level of your heart as often as possible.  Drink enough fluids to keep your urine clear or pale yellow.  Take over-the-counter and prescription medicines only as told by your health care provider.  Do not drive or operate heavy machinery while taking prescription pain medicine.  Follow instructions from your health care provider about eating or drinking restrictions.  Return to your normal activities as told by your health care provider. Ask your health care provider what activities are safe for you. Avoiding Future Gout Attacks  Follow a low-purine diet as told by your dietitian or health care provider. Avoid foods and drinks that are high in purines, including liver, kidney, anchovies, asparagus, herring, mushrooms, mussels, and beer.  Limit alcohol intake to no more than 1 drink a day for nonpregnant women and 2 drinks a day for men. One drink equals 12 oz of beer, 5 oz of wine, or 1 oz of hard liquor.  Maintain a healthy weight or lose weight if you are overweight. If you want to lose weight, talk with your health care provider. It is important that you do not lose weight too quickly.  Start or maintain an exercise program as told by your health care provider.  Drink enough fluids to keep your urine clear or pale yellow.  Take over-the-counter and prescription medicines only as told by your health care provider.  Keep all follow-up visits as told by your health care provider. This is important. Contact a health care provider if:  You have another gout attack.  You  continue to have symptoms of a gout attack after10 days of treatment.  You have side effects from your medicines.  You have chills or a fever.  You have burning pain when you urinate.  You have pain in your lower back or belly. Get help right away if:  You have severe or uncontrolled pain.  You cannot urinate. This information is not intended to replace advice given to you by your health care provider. Make sure you discuss any questions you have with your health care provider. Document Released: 11/09/2000 Document Revised: 04/19/2016 Document Reviewed: 08/25/2015 Elsevier Interactive Patient Education  2017 [employer].    Try and read through the material provided to follow a low-purine diet. It is possible that some of the ice cream and  other foods might have triggered this flare.  Since your flares have been so infrequent, and uric acid level wasn't elevated on last check, let's avoid starting a daily medication right now--if you have ongoing problems or frequent flares, then we can definitely start it (or if you uric acid level is much higher when we recheck at your visit in February).  For now, I would continue ibuprofen 800mg  three times daily with food, to keep the inflammation in the joint down.  If not tolerating 800mg , you can back down to 600mg  (3 tablets)--if needed you can take this every 6 hours (4x/day)--the 800mg  can only be taken 3 times daily (max dose of 12 OTC ibuprofen daily)  Do not take the colchicine ongoing--use it just if needed for acute flare (when red/swollen/hot and not responding to the ibuprofen that you're taking).  Follow up as scheduled next month.

## 2016-12-24 DIAGNOSIS — M47816 Spondylosis without myelopathy or radiculopathy, lumbar region: Secondary | ICD-10-CM | POA: Insufficient documentation

## 2016-12-24 DIAGNOSIS — M160 Bilateral primary osteoarthritis of hip: Secondary | ICD-10-CM | POA: Insufficient documentation

## 2016-12-24 DIAGNOSIS — M47812 Spondylosis without myelopathy or radiculopathy, cervical region: Secondary | ICD-10-CM | POA: Insufficient documentation

## 2016-12-24 DIAGNOSIS — Z96651 Presence of right artificial knee joint: Secondary | ICD-10-CM | POA: Insufficient documentation

## 2016-12-24 DIAGNOSIS — M170 Bilateral primary osteoarthritis of knee: Secondary | ICD-10-CM | POA: Insufficient documentation

## 2016-12-24 DIAGNOSIS — M47817 Spondylosis without myelopathy or radiculopathy, lumbosacral region: Secondary | ICD-10-CM | POA: Insufficient documentation

## 2016-12-24 NOTE — Progress Notes (Signed)
Office Visit Note  Patient: [name]             Date of Birth: 1956-02-06           MRN: [id number]             PCP: Vikki Ports, MD Referring: Rita Ohara, MD Visit Date: 12/26/2016 Occupation: @GUAROCC @    Subjective:  Generalized pain   History of Present Illness: [name] is a 61 y.o. female with fibromyalgia syndrome degenerative disc disease and osteoarthritis. She states that she's been having increased pain lately. She experiences increased pain with the weather change. The pain is more on her left side than the right side. She does have some discomfort in her neck area and some in the lower back. Her knee joints continue to hurt her. Her right total knee replacement is doing well.  Activities of Daily Living:  Patient reports morning stiffness for 30 minutes.   Patient Reports nocturnal pain.  Difficulty dressing/grooming: Denies Difficulty climbing stairs: Reports Difficulty getting out of chair: Reports Difficulty using hands for taps, buttons, cutlery, and/or writing: Reports   Review of Systems  Constitutional: Positive for fever and weakness. Negative for fatigue, night sweats, weight gain and weight loss.  HENT: Positive for mouth dryness. Negative for mouth sores, trouble swallowing, trouble swallowing and nose dryness.   Eyes: Negative for pain, redness, visual disturbance and dryness.  Respiratory: Negative for cough, shortness of breath and difficulty breathing.   Cardiovascular: Negative for chest pain, palpitations, hypertension, irregular heartbeat and swelling in legs/feet.  Gastrointestinal: Negative for blood in stool, constipation and diarrhea.  Endocrine: Negative for increased urination.  Genitourinary: Negative for vaginal dryness.  Musculoskeletal: Positive for arthralgias, joint pain, myalgias, morning stiffness and myalgias. Negative for joint swelling, muscle weakness and muscle tenderness.  Skin: Negative for color change, rash, hair  loss, skin tightness, ulcers and sensitivity to sunlight.  Allergic/Immunologic: Negative for susceptible to infections.  Neurological: Negative for dizziness, memory loss and night sweats.  Hematological: Negative for swollen glands.  Psychiatric/Behavioral: Positive for depressed mood and sleep disturbance. The patient is nervous/anxious.     PMFS History:  Patient Active Problem List   Diagnosis Date Noted  . DJD (degenerative joint disease), cervical 12/24/2016  . Primary osteoarthritis of both hips 12/24/2016  . Primary osteoarthritis of both knees 12/24/2016  . H/O total knee replacement, right 12/24/2016  . Spondylosis of lumbar region without myelopathy or radiculopathy 12/24/2016  . Acute gout 05/30/2016  . Obstructive apnea 05/10/2016  . Myalgia and myositis, unspecified 03/29/2016  . Other long term (current) drug therapy 03/29/2016  . Idiopathic peripheral neuropathy 03/29/2016  . Cannot sleep 03/29/2016  . Migraine without aura and responsive to treatment 03/29/2016  . Multifocal myoclonus 03/29/2016  . Restless leg 03/29/2016  . Has a tremor 03/29/2016  . Dyspnea 01/25/2016  . History of aspiration pneumonitis 01/25/2016  . History of acute bronchitis 01/25/2016  . LPRD (laryngopharyngeal reflux disease) 01/25/2016  . Imbalance 01/09/2016  . Serotonin syndrome 12/22/2015  . OA (osteoarthritis) of knee 09/05/2015  . Obesity, Class II, BMI 35-39.9, with comorbidity ([place]) 05/17/2015  . OSA (obstructive sleep apnea) 03/16/2013  . Iron deficiency 11/06/2012  . Thrombocythemia ([place]) 11/06/2012  . Elevated WBC count 11/05/2012  . Impaired fasting glucose 07/23/2012  . Bronchitis 04/03/2012  . Kidney stone on left side 03/05/2012  . Kidney cysts 03/04/2012  . Loose stools 03/04/2012  . Urinary frequency 12/04/2011  . Restless leg syndrome  12/04/2011  . Polypharmacy 12/04/2011  . Bladder incontinence 12/04/2011  . Fibromyalgia   . Hyperlipidemia   . Edema   . S/P  endometrial ablation   . Allergic rhinitis 09/18/2011  . Pneumonia, aspiration ([place]) 05/27/2011  . Depressive disorder, not elsewhere classified 04/21/2011  . OVERWEIGHT 02/17/2010  . PRECORDIAL PAIN 02/17/2010  . Anxiety state 01/30/2010  . Migraine 01/30/2010  . GERD 01/30/2010  . Gastroparesis 01/30/2010    Past Medical History:  Diagnosis Date  . Anemia    previously followed by Dr. Jamse Arn for anemia and elevated platelets  . Anxiety   . C. difficile colitis 10/01/12   treated by [facility]  . Chronic fatigue syndrome   . DDD (degenerative disc disease), lumbar 08/19/14   and facet arthroplasty & left lumbar radiculopathy (Dr.Ramos)  . Depression   . Dyssynergia    dyssynergenic defecation, contributing to fecal incontinence.  . Edema   . Fibromyalgia   . Gastroparesis    followed at [facility]  . GERD (gastroesophageal reflux disease)   . Hyperlipidemia   . Kidney stone   . Lumbar radiculopathy   . Migraine   . Neuropathy ([place])   . Obstructive sleep apnea    On Cpap 2009  . Paresthesia    Dr. Everette Rank at [facility]  . Pelvic floor dysfunction    pelvic floor dyssynergy  . Plantar fasciitis 02/2011   R foot  . Pneumonia    2012  . PONV (postoperative nausea and vomiting)    pt states has gastroparesis has difficulty taking antibiotics and narcotics has severe nausea and vomiting   . Restless leg syndrome   . S/P endometrial ablation 08/09/2006   Novasure Ablation  . S/P epidural steroid injection 09/20/14   Dr.Ramos  . Shortness of breath dyspnea    bending over; exertion   . Tremor    Dr. Everette Rank  . Urinary frequency   . Urinary incontinence     Family History  Problem Relation Age of Onset  . Allergies Mother   . Hypertension Mother   . Heart disease Mother     possible valve problem  . Heart disease Father     pacemaker, CHF  . Hypertension Father   . Diabetes Father     borderline  . Stroke Father 69  . Kidney disease Father   . Asthma Sister    . Irritable bowel syndrome Sister   . Allergies Sister   . Heart disease Paternal Grandmother   . Heart disease Paternal Grandfather   . Cancer Maternal Aunt     leukemia  . Cancer Maternal Aunt   . Colon cancer Maternal Aunt     late 60's   Past Surgical History:  Procedure Laterality Date  . CHOLECYSTECTOMY  9/05  . ENDOMETRIAL ABLATION  08/09/2006   Dr. Valentina Shaggy Ablation  . KNEE SURGERY  1999   R knee, Dr. Eddie Dibbles, torn cartilage  . TONSILLECTOMY  1968  . TONSILLECTOMY    . TOTAL KNEE ARTHROPLASTY Right 09/05/2015   Procedure: RIGHT TOTAL KNEE ARTHROPLASTY;  Surgeon: Gaynelle Arabian, MD;  Location: [facility];  Service: Orthopedics;  Laterality: Right;   Social History   Social History Narrative   Married, 1 dog. 1 son in [place], 1 stepson in [place], with 2 children     Objective: Vital Signs: BP 130/80   Pulse 82   Ht 5' 2.5" (1.588 m)   Wt 221 lb (100.2 kg)   LMP 07/27/2006   BMI  39.78 kg/m    Physical Exam  Constitutional: She is oriented to person, place, and time. She appears well-developed and well-nourished.  HENT:  Head: Normocephalic and atraumatic.  Eyes: Conjunctivae and EOM are normal.  Neck: Normal range of motion.  Cardiovascular: Normal rate, regular rhythm, normal heart sounds and intact distal pulses.   Pulmonary/Chest: Effort normal and breath sounds normal.  Abdominal: Soft. Bowel sounds are normal.  Lymphadenopathy:    She has no cervical adenopathy.  Neurological: She is alert and oriented to person, place, and time.  Skin: Skin is warm and dry. Capillary refill takes less than 2 seconds.  Psychiatric: She has a normal mood and affect. Her behavior is normal.  Nursing note and vitals reviewed.    Musculoskeletal Exam: C-spine some limitation of range of motion lumbar spine limitation of range of motion. She has discomfort with range of motion of her left shoulder joint which is causing most discomfort today. Elbow joints wrist joint  MCPs PIPs DIPs with good range of motion with no synovitis. Her right total knee replacement is doing well. Left knee joints bilateral ankles with good range of motion with no synovitis. Fibromyalgia tender points were 18 out of 18 positive.  CDAI Exam: No CDAI exam completed.    Investigation: No additional findings.   Imaging: No results found.  Speciality Comments: No specialty comments available.    Procedures:  Large Joint Inj Date/Time: 12/26/2016 11:52 AM Performed by: Bo Merino Authorized by: Bo Merino   Consent Given by:  Patient Site marked: the procedure site was marked   Timeout: prior to procedure the correct patient, procedure, and site was verified   Indications:  Pain Location:  Shoulder Site:  L glenohumeral Prep: patient was prepped and draped in usual sterile fashion   Needle Size:  27 G Needle Length:  1.5 inches Approach:  Posterior Ultrasound Guidance: No   Fluoroscopic Guidance: No   Arthrogram: No   Medications:  1 mL lidocaine 1 %; 40 mg triamcinolone acetonide 40 MG/ML Aspiration Attempted: Yes   Aspirate amount (mL):  0 Patient tolerance:  Patient tolerated the procedure well with no immediate complications   Allergies: Erythromycin; Tramadol; and Dilaudid [hydromorphone hcl]   Assessment / Plan:     Visit Diagnoses: Fibromyalgia: She is having a flare with increased pain all over and myalgias with positive tender points  Left shoulder joint pain and discomfort: She describes nocturnal pain and discomfort raising her arm after different treatment options were discussed with decided to injected with cortisone that procedure is described as above. She tolerated the procedure well.  DJD (degenerative joint disease), cervical: Chronic pain and discomfort  DDD lumbar spine: Chronic pain  Primary osteoarthritis of both hips: She has some limitation of range of motion discomfort.  Primary osteoarthritis of both knees she  continues to have pain in her bilateral knees.  H/O total knee replacement, right: Doing fairly well.  Her other medical problems are listed as below:  Anxiety state  Migraine without status migrainosus, not intractable, unspecified migraine type  Gastroesophageal reflux disease without esophagitis  Mixed hyperlipidemia  Restless leg syndrome  OSA (obstructive sleep apnea)  Obesity, Class II, BMI 35-39.9, with comorbidity ([place])  Multifocal myoclonus    Orders: Orders Placed This Encounter  Procedures  . Large Joint Injection/Arthrocentesis   No orders of the defined types were placed in this encounter.   Face-to-face time spent with patient was 30 minutes. 50% of time was spent in counseling and  coordination of care.  Follow-Up Instructions: Return in about 6 months (around 06/25/2017) for Fibromyalgia, osteoarthritis.   Bo Merino, MD  Note - This record has been created using Editor, commissioning.  Chart creation errors have been sought, but may not always  have been located. Such creation errors do not reflect on  the standard of medical care.

## 2016-12-26 ENCOUNTER — Encounter: Payer: Self-pay | Admitting: Rheumatology

## 2016-12-26 ENCOUNTER — Ambulatory Visit (INDEPENDENT_AMBULATORY_CARE_PROVIDER_SITE_OTHER): Payer: 59 | Admitting: Rheumatology

## 2016-12-26 VITALS — BP 130/80 | HR 82 | Ht 62.5 in | Wt 221.0 lb

## 2016-12-26 DIAGNOSIS — Z96651 Presence of right artificial knee joint: Secondary | ICD-10-CM

## 2016-12-26 DIAGNOSIS — M25512 Pain in left shoulder: Secondary | ICD-10-CM

## 2016-12-26 DIAGNOSIS — M47816 Spondylosis without myelopathy or radiculopathy, lumbar region: Secondary | ICD-10-CM | POA: Diagnosis not present

## 2016-12-26 DIAGNOSIS — K219 Gastro-esophageal reflux disease without esophagitis: Secondary | ICD-10-CM

## 2016-12-26 DIAGNOSIS — G4733 Obstructive sleep apnea (adult) (pediatric): Secondary | ICD-10-CM

## 2016-12-26 DIAGNOSIS — F411 Generalized anxiety disorder: Secondary | ICD-10-CM | POA: Diagnosis not present

## 2016-12-26 DIAGNOSIS — M797 Fibromyalgia: Secondary | ICD-10-CM

## 2016-12-26 DIAGNOSIS — M5030 Other cervical disc degeneration, unspecified cervical region: Secondary | ICD-10-CM | POA: Diagnosis not present

## 2016-12-26 DIAGNOSIS — M160 Bilateral primary osteoarthritis of hip: Secondary | ICD-10-CM

## 2016-12-26 DIAGNOSIS — G43909 Migraine, unspecified, not intractable, without status migrainosus: Secondary | ICD-10-CM

## 2016-12-26 DIAGNOSIS — E669 Obesity, unspecified: Secondary | ICD-10-CM

## 2016-12-26 DIAGNOSIS — M47812 Spondylosis without myelopathy or radiculopathy, cervical region: Secondary | ICD-10-CM

## 2016-12-26 DIAGNOSIS — G2581 Restless legs syndrome: Secondary | ICD-10-CM

## 2016-12-26 DIAGNOSIS — IMO0001 Reserved for inherently not codable concepts without codable children: Secondary | ICD-10-CM

## 2016-12-26 DIAGNOSIS — M170 Bilateral primary osteoarthritis of knee: Secondary | ICD-10-CM

## 2016-12-26 DIAGNOSIS — G253 Myoclonus: Secondary | ICD-10-CM

## 2016-12-26 DIAGNOSIS — E782 Mixed hyperlipidemia: Secondary | ICD-10-CM

## 2016-12-26 MED ORDER — LIDOCAINE HCL 1 % IJ SOLN
1.0000 mL | INTRAMUSCULAR | Status: AC | PRN
  Administered 2016-12-26: 1 mL

## 2016-12-26 MED ORDER — TRIAMCINOLONE ACETONIDE 40 MG/ML IJ SUSP
40.0000 mg | INTRAMUSCULAR | Status: AC | PRN
  Administered 2016-12-26: 40 mg via INTRA_ARTICULAR

## 2016-12-26 MED ORDER — ARMODAFINIL 250 MG PO TABS: 250.0000 mg | ORAL_TABLET | Freq: Every day | ORAL | 1 refills | Status: DC

## 2016-12-27 ENCOUNTER — Ambulatory Visit: Payer: 59 | Admitting: Rheumatology

## 2017-01-17 ENCOUNTER — Other Ambulatory Visit: Payer: Self-pay | Admitting: Rheumatology

## 2017-01-17 NOTE — Telephone Encounter (Signed)
ok 

## 2017-01-17 NOTE — Telephone Encounter (Signed)
Last Visit: 06/26/16 Next Visit due February 2018. Message sent to the front to schedule patient.  Okay to refill Methocarbamol?

## 2017-01-18 ENCOUNTER — Telehealth: Payer: Self-pay | Admitting: Rheumatology

## 2017-01-18 NOTE — Telephone Encounter (Signed)
-----   Message from Carole Binning, LPN sent at [id number] 11:26 AM EST ----- Regarding: Please schedule patient for follow up visit Please schedule patient for follow up visit. Patient due February 2018. Thanks!

## 2017-01-18 NOTE — Telephone Encounter (Signed)
Patient is scheduled for 02/21/17 @ 10:45

## 2017-01-20 NOTE — Progress Notes (Signed)
Chief Complaint  Patient presents with  . Annual Exam    fasting annual exam/med check, no pap sees Dr Phineas Real. No eye exam-just had one with Dr Sabra Heck. Would like to discuss her gout and being on something preventatively. And havin anxiety/depression issues.    [name] is a 61 y.o. female who presents for a complete physical, and follow up on chronic medical conditions.  She has many doctors provider care to her, including:   GYN: Dr. Phineas Real Neuro: Everette Rank (has seen Dr. Brett Fairy in the past for OSA in past) Rheum: Dr. Estanislado Pandy Psych: (previously Dr. Caprice Beaver), plans to continue with her NP; gets counseling through their office as well. GI: Dr. Derrill Kay, Dr. Roney Mans Podiatry: Dr. Milinda Pointer Ophtho: Dr. Sabra Heck Dentist: Dr. Ladona Horns Urologist: Dr. Jeffie Pollock Pulmonologist: Dr. Lenna Gilford (seen 01/2016) Cardiologist: Dr. Johnsie Cancel (07/2015 for pre-op clearance; last seen 09/2015)   Gout flare in January--treated with ibuprofen 842m and colchicine.  Due for recheck of uric acid level to determine (when not in flare) if a candidate for uric acid lowering therapy (ie allopurinol).  Previous uric acid levels were WNL. Diet reviewed at last visit for flare. Hasn't had any gout pain since her last visit. (maybe barely a twinge).  Hypertension: (never formally diagnosed, because had been on diuretics for fluid retention (stopped a while ago) and beta blockers for tremor--but high BP's noted when not taking these medications in past).  She periodically checks her BP at home, running 130's/70's, sometimes up to 140. She reports sometimes her pulse at home is higher, around 100 (previously evaluated by cardiologist). She denies side effects or muscle cramping. She hasn't been on diuretics for a while, and denies any edema.  Allergies: doing pretty well currently. Using Flonase and zyrtec every day.  OSA--on CPAP, tolerating well. She reports she needed to use her inhaler yesterday, after walking quickly into  [place]. It was effective in treating her shortness of breath.  Hyperlipidemia follow-up: Patient is trying to follow a low-fat, low cholesterol diet as best she can. Compliant with medications and denies medication side effects.  Lab Results  Component Value Date   CHOL 162 12/07/2015   HDL 52 12/07/2015   LDLCALC 91 12/07/2015   TRIG 97 12/07/2015   CHOLHDL 3.1 12/07/2015    Migraines:Takes topamax for prevention (also on pamelor). Previously took imitrex for migraines (stopped related to serotonin syndrome/hospitalization). Gets migraines about once a month, relieved by OTC medication most of the time.  Tremor--on beta blocker. Sometimes it is bad (hard to send text message), but for the most part it is well controlled. Worse in the right hand.  Neuropathy in her feet--Currently on gabapentin (3069min am, 60081mHS).  She still finds the neuropathy bothersome at bedtime. She has f/u with Dr. TonValaria Goodter this week.  (previously took Lyrica, only tolerated at 85m49mD. He also prescribes the nortriptyline--takes 3-4 (usually 4) at bedtime.  RLS: This is controlled, just rare.  She is now taking Gabapentin (previously used Klonipin)  Gastroparesis:As mentioned at her January visit, she had a tough year related to her gastroparesis. She was [facility] specialist[facility] for abnormalities on manometry study. She had endoscopy, manometry and pH probe in December. She reports had ongoing reflux (11% of the time) despite her current regimen. She is scheduled for additional testing on 3/6.  He thinks gastroparesis is better, but worried about scar tissue (?barium swallow?)  Fibromyalgia: treated by Dr. DeveEstanislado Pandye was last  seen 12/26/16 and had left shoulder cortisone injection. She is having more fibromyalgia pain overall (all trigger points were positive).  She is still having some shoulder pain. Her right knee replacement continues to do well.   She  prescribes armodafinil for chronic fatigue.  Kidney stones: July 2017 was noted to have 30m R UPJ stone, s/p treatment. Denies any urinary complaints or hematuria.  Depression: She is frustrated/aggravated that "something" is going on all the time, medically.  Feels like she isn't dependable, everything falls onto her older sister (in caring for mother).  She has been getting counseling regularly. Previously under the care of Dr. MCaprice Beaver plans to see the NP in the same practice (Dr. MCaprice Beaverno longer there). Feels like her family would be better off without her.  Denies any SI or HI, would never act.   Immunization History  Administered Date(s) Administered  . Influenza Split 09/17/2011, 09/03/2012, 09/04/2013  . Influenza,inj,Quad PF,36+ Mos 08/16/2014, 07/27/2015  . Influenza-Unspecified 10/04/2016  . Tdap 01/16/2011   Last Pap smear: 03/2015 Dr. FCyndie ChimeLast mammogram: 02/2016 [facility] Dentist: twice yearly Ophtho: yearly Exercise: not much currently.  (pain in knees, gout flare).  Previously walked in mornings and afternoons. Vitamin D-OH 32 in 11/2015  Past Medical History:  Diagnosis Date  . Anemia    previously followed by Dr. OJamse Arnfor anemia and elevated platelets  . Anxiety   . C. difficile colitis 10/01/12   treated by [facility]  . Chronic fatigue syndrome   . DDD (degenerative disc disease), lumbar 08/19/14   and facet arthroplasty & left lumbar radiculopathy (Dr.Ramos)  . Depression   . Dyssynergia    dyssynergenic defecation, contributing to fecal incontinence.  . Edema   . Fibromyalgia   . [facility]    [facility] . GERD (gastroesophageal reflux disease)   . Hyperlipidemia   . Kidney stone   . Lumbar radiculopathy   . Migraine   . Neuropathy (HRosa Sanchez   . Obstructive sleep apnea    On Cpap 2009  . [facility] . Pelvic floor dysfunction    pelvic floor  dyssynergy  . Plantar fasciitis 02/2011   R foot  . Pneumonia    2012  . PONV (postoperative nausea and vomiting)    pt states has gastroparesis has difficulty taking antibiotics and narcotics has severe nausea and vomiting   . Restless leg syndrome   . S/P endometrial ablation 08/09/2006   Novasure Ablation  . S/P epidural steroid injection 09/20/14   Dr.Ramos  . Shortness of breath dyspnea    bending over; exertion   . Tremor    Dr. TEverette Rank . Urinary frequency   . Urinary incontinence     Past Surgical History:  Procedure Laterality Date  . CHOLECYSTECTOMY  9/05  . ENDOMETRIAL ABLATION  08/09/2006   Dr. FValentina ShaggyAblation  . KNEE SURGERY  1999   R knee, Dr. PEddie Dibbles torn cartilage  . TONSILLECTOMY  1968  . TONSILLECTOMY    . TOTAL KNEE ARTHROPLASTY Right 09/05/2015   Procedure: RIGHT TOTAL KNEE ARTHROPLASTY;  Surgeon: FGaynelle Arabian MD;  Location: [facility];  Service: Orthopedics;  Laterality: Right;    Social History   Social History  . Marital status: Married    Spouse name: N/A  . Number of children: 2  . Years of education: N/A   Occupational History  . customer service (on disability) [employer]   Social History Main Topics  . Smoking status: Never Smoker  . Smokeless tobacco: Never Used  . Alcohol use No  . Drug use: No  . Sexual activity: Not Currently    Birth control/ protection: Post-menopausal     Comment: 1st intercourse 61 yo-Fewer than 5 partners   Other Topics Concern  . Not on file   Social History Narrative   Married, 1 dog. 1 son in [place] (expecting first child end 06/2017), 1 stepson in [place], with 2 children    Family History  Problem Relation Age of Onset  . Allergies Mother   . Hypertension Mother   . Heart disease Mother     possible valve problem  . Macular degeneration Mother   . Heart disease Father     pacemaker, CHF  . Hypertension Father   . Diabetes Father     borderline  . Stroke Father 5  . Kidney disease  Father   . Asthma Sister   . Irritable bowel syndrome Sister   . Allergies Sister   . Heart disease Paternal Grandmother   . Heart disease Paternal Grandfather   . Cancer Maternal Aunt     leukemia  . Cancer Maternal Aunt   . Colon cancer Maternal Aunt     late 60's    Outpatient Encounter Prescriptions as of 01/21/2017  Medication Sig Note  . acetaminophen (TYLENOL) 500 MG tablet Take 1,000 mg by mouth every 6 (six) hours as needed for mild pain. Reported on 01/02/2016   . albuterol (PROVENTIL HFA;VENTOLIN HFA) 108 (90 Base) MCG/ACT inhaler Inhale into the lungs every 6 (six) hours as needed for wheezing or shortness of breath. Reported on 05/30/2016   . ALPRAZolam (XANAX) 0.25 MG tablet Take 0.25 mg by mouth 3 (three) times daily as needed for anxiety.  04/16/2016: Takes one daily, usually  . Armodafinil 250 MG tablet Take 1 tablet (250 mg total) by mouth daily.   Marland Kitchen atorvastatin (LIPITOR) 40 MG tablet TAKE 1 TABLET (40 MG TOTAL) BY MOUTH DAILY.   . cetirizine (ZYRTEC) 10 MG tablet Take 10 mg by mouth at bedtime.   . cholecalciferol (VITAMIN D) 1000 units tablet Take 1,000 Units by mouth daily.   Marland Kitchen dronabinol (MARINOL) 5 MG capsule Take 5 mg by mouth daily before lunch.  01/21/2017: Uses prn, once every 2 weeks (she can't tell if it helps)  . DULoxetine (CYMBALTA) 60 MG capsule Take 60 mg by mouth 2 (two) times daily.  05/17/2016: Received from: External Pharmacy  . fluticasone (FLONASE) 50 MCG/ACT nasal spray PLACE 2 SPRAYS INTO BOTH NOSTRILS DAILY.   Marland Kitchen gabapentin (NEURONTIN) 300 MG capsule Take 1 capsule (300 mg total) by mouth at bedtime. (Patient taking differently: Take 900 mg by mouth 1 day or 1 dose. ) 01/21/2017: 320m am and 602mqhs  . metoprolol (LOPRESSOR) 50 MG tablet Take 1 tablet (50 mg total) by mouth 2 (two) times daily.   . Multiple Vitamins-Minerals (PRESERVISION AREDS PO) Take 1 capsule by mouth 2 (two) times daily. Reported on 01/02/2016 12/22/2015: Pt's personal med lists an "I  Caps" supplement.    . nortriptyline (PAMELOR) 50 MG capsule TAKE 3-4 CAPSULES BY MOUTH AT BEDTIME   . ondansetron (ZOFRAN) 8 MG tablet Take 8 mg by mouth every 8 (eight) hours as needed for nausea or vomiting. Reported on 05/09/2016   . pantoprazole (PROTONIX) 40 MG tablet Take 40 mg by mouth 2 (two) times daily.   . Probiotic  Product (Azure) CAPS Take 1 capsule by mouth daily. Reported on 01/02/2016   . topiramate (TOPAMAX) 25 MG tablet Take 50 mg by mouth at bedtime. 05/09/2016: Received from: External Pharmacy  . [DISCONTINUED] gabapentin (NEURONTIN) 300 MG capsule Take by mouth.   . [DISCONTINUED] oxyCODONE-acetaminophen (ROXICET) 5-325 MG tablet Take 1 tablet by mouth every 4 (four) hours as needed for severe pain.   Marland Kitchen colchicine 0.6 MG tablet Take 2 tablets by mouth at onset of gout flare. Taken an additional tablet 1 hour later, if needed. May take like this every 3d, if needed (Patient not taking: Reported on 01/21/2017)   . meclizine (ANTIVERT) 25 MG tablet Take 1 tablet (25 mg total) by mouth 2 (two) times daily. For vertigo/dizzy (Patient not taking: Reported on 10/22/2016)   . meloxicam (MOBIC) 15 MG tablet Take 15 mg by mouth as needed.  09/18/2016: Received from: External Pharmacy  . methocarbamol (ROBAXIN) 500 MG tablet TAKE 1 TABLET AT 7AM AND AT 2PM (Patient not taking: Reported on 01/21/2017)   . prochlorperazine (COMPAZINE) 10 MG tablet Take 10 mg by mouth every 6 (six) hours as needed for nausea or vomiting. Reported on 05/09/2016   . promethazine (PHENERGAN) 25 MG suppository Place 25 mg rectally every 6 (six) hours as needed for nausea or vomiting. Reported on 05/09/2016   . [DISCONTINUED] Dextromethorphan-Guaifenesin ([place] FAST-MAX DM MAX) 5-100 MG/5ML LIQD Take 30 mLs by mouth every 4 (four) hours.   . [DISCONTINUED] hyoscyamine (ANASPAZ) 0.125 MG TBDP disintergrating tablet Place 0.125 mg under the tongue. 09/18/2016: Not taking  . [DISCONTINUED] Lifitegrast 5 %  SOLN Place 1 drop into both eyes 2 times daily.   . [DISCONTINUED] nortriptyline (PAMELOR) 50 MG capsule Take 150 mg by mouth at bedtime.   . [DISCONTINUED] Propylene Glycol 0.6 % SOLN Place 1 drop into both eyes 2 times daily.   . [DISCONTINUED] topiramate (TOPAMAX) 25 MG tablet Take 50 mg by mouth.    No facility-administered encounter medications on file as of 01/21/2017.     Allergies  Allergen Reactions  . Erythromycin Nausea Only  . Tramadol Itching  . Dilaudid [Hydromorphone Hcl] Itching    ROS: The patient denies fever, weight changes, headaches (migraine as per HPI),  vision changes, decreased hearing, ear pain, sore throat, breast concerns, palpitations (notes elevated heart rate in general), dizziness, syncope, cough, swelling, diarrhea, constipation (often goes back and forth, per her usual), no abdominal pain (some intermittently in mid-portion), melena, hematochezia, indigestion/heartburn (only occasional slight discomfort in chest--being evaluated now), hematuria, incontinence, dysuria, vaginal bleeding, discharge, odor or itch, genital lesions, weakness, suspicious skin lesions, abnormal bleeding/bruising, or enlarged lymph nodes. dyspnea on exertion--improving, albuterol helped yesterday. Infrequent. No longer having vertigo. Slightly lightheaded if she stands too quickly from bending over. Tremor is stable. Depression per HPI.   PHYSICAL EXAM:  BP 120/60 (BP Location: Left Arm, Patient Position: Sitting, Cuff Size: Normal)   Pulse 84   Ht 5' 2.5" (1.588 m)   Wt 215 lb (97.5 kg)   LMP 07/27/2006   BMI 38.70 kg/m   General Appearance:    Alert, cooperative, no distress, appears stated age  Head:    Normocephalic, without obvious abnormality, atraumatic  Eyes:    PERRL, conjunctiva/corneas clear, EOM's intact, fundi    benign  Ears:    Normal TM's and external ear canals  Nose:   Nares normal, mucosa--mild edema, no erythema or purulence, no sinus tenderness   Throat:   Lips, mucosa, and  tongue normal; teeth and gums normal  Neck:   Supple, no lymphadenopathy;  thyroid:  no   enlargement/tenderness/nodules; no carotid   bruit or JVD  Back:    Spine nontender, no curvature, ROM normal, no CVA tenderness. Nontender over muscles, no trigger points noted today  Lungs:     Clear to auscultation bilaterally without wheezes, rales or  ronchi; respirations unlabored  Chest Wall:    No tenderness or deformity   Heart:    Regular rate and rhythm, S1 and S2 normal, no murmur, rub   or gallop  Breast Exam:    Deferred to GYN  Abdomen:     Soft, obese, nondistended, normoactive bowel sounds,  no masses, no hepatosplenomegaly. Slightly tender along inferior abdomen bilaterally.  No epigastric tenderness.   Genitalia:    Deferred to GYN     Extremities:   No clubbing, cyanosis or edema. Thickened and discolored toenails noted in 1st, 2nd and 5th toenails bilaterally.  Pulses:   2+ and symmetric all extremities  Skin:   Skin color, texture, turgor normal, no rashes or lesions  Lymph nodes:   Cervical, supraclavicular, and axillary nodes normal  Neurologic:   CNII-XII intact, normal strength, sensation and gait; reflexes 2+ and symmetric throughout. No significant tremor noted                                Psych:   Normal mood, affect, hygiene and grooming   ASSESSMENT/PLAN:  Annual physical exam - Plan: POCT Urinalysis Dipstick, Hemoglobin A1c, TSH, CBC with Differential/Platelet, Comprehensive metabolic panel, Lipid panel, Uric Acid  Fibromyalgia - per Dr. Estanislado Pandy.  Recent flare, although no trigger points noted today   Migraine without status migrainosus, not intractable, unspecified migraine type - doing well on preventative regimen; OTC meds effective when needed  Restless leg syndrome - controlled  Idiopathic peripheral neuropathy - still some ongoing symptoms, despite many medications; plans to discuss at upcoming f/u with neuro  Mixed  hyperlipidemia - Plan: Lipid panel  IFG (impaired fasting glucose) - encouraged regular exercise, weight loss, low carb diet - Plan: Hemoglobin A1c  OSA (obstructive sleep apnea) - continue CPAP  Medication monitoring encounter - Plan: CBC with Differential/Platelet, Comprehensive metabolic panel, Lipid panel  Gastroparesis  Allergic rhinitis, unspecified chronicity, unspecified seasonality, unspecified trigger  Obesity, Class II, BMI 35-39.9, with comorbidity (HCC)  Anxiety state  Depression, major, single episode, in partial remission (HCC)  Acute gout involving toe of right foot, unspecified cause - flare resolved.  Check uric acid level; start allopurinol if elevated. Low purine diet encouraged - Plan: Uric Acid   CBC, C-met, lipids, TSH, uric acid, A1c  Just picked up refill of atorvastatin. No refills needed today.   Depression--continue counseling; consider seeing NP sooner than June appointment. Some counseling provided today.  Discussed monthly self breast exams and yearly mammograms; at least 30 minutes of aerobic activity at least 5 days/week, weight-bearing exercise at least 2x/wk; proper sunscreen use reviewed; healthy diet, including goals of calcium and vitamin D intake and alcohol recommendations (less than or equal to 1 drink/day) reviewed; regular seatbelt use; changing batteries in smoke detectors.  Immunization recommendations discussed, UTD. Shingrix when available. Colonoscopy recommendations reviewed, UTD

## 2017-01-21 ENCOUNTER — Encounter: Payer: Self-pay | Admitting: Family Medicine

## 2017-01-21 ENCOUNTER — Ambulatory Visit (INDEPENDENT_AMBULATORY_CARE_PROVIDER_SITE_OTHER): Payer: 59 | Admitting: Family Medicine

## 2017-01-21 VITALS — BP 120/60 | HR 84 | Ht 62.5 in | Wt 215.0 lb

## 2017-01-21 DIAGNOSIS — Z0000 Encounter for general adult medical examination without abnormal findings: Secondary | ICD-10-CM | POA: Diagnosis not present

## 2017-01-21 DIAGNOSIS — M797 Fibromyalgia: Secondary | ICD-10-CM | POA: Diagnosis not present

## 2017-01-21 DIAGNOSIS — Z5181 Encounter for therapeutic drug level monitoring: Secondary | ICD-10-CM

## 2017-01-21 DIAGNOSIS — F411 Generalized anxiety disorder: Secondary | ICD-10-CM | POA: Diagnosis not present

## 2017-01-21 DIAGNOSIS — J309 Allergic rhinitis, unspecified: Secondary | ICD-10-CM | POA: Diagnosis not present

## 2017-01-21 DIAGNOSIS — G43909 Migraine, unspecified, not intractable, without status migrainosus: Secondary | ICD-10-CM

## 2017-01-21 DIAGNOSIS — K3184 Gastroparesis: Secondary | ICD-10-CM

## 2017-01-21 DIAGNOSIS — R7301 Impaired fasting glucose: Secondary | ICD-10-CM | POA: Diagnosis not present

## 2017-01-21 DIAGNOSIS — G4733 Obstructive sleep apnea (adult) (pediatric): Secondary | ICD-10-CM

## 2017-01-21 DIAGNOSIS — F324 Major depressive disorder, single episode, in partial remission: Secondary | ICD-10-CM

## 2017-01-21 DIAGNOSIS — G2581 Restless legs syndrome: Secondary | ICD-10-CM

## 2017-01-21 DIAGNOSIS — E669 Obesity, unspecified: Secondary | ICD-10-CM

## 2017-01-21 DIAGNOSIS — E782 Mixed hyperlipidemia: Secondary | ICD-10-CM

## 2017-01-21 DIAGNOSIS — G609 Hereditary and idiopathic neuropathy, unspecified: Secondary | ICD-10-CM

## 2017-01-21 DIAGNOSIS — IMO0001 Reserved for inherently not codable concepts without codable children: Secondary | ICD-10-CM

## 2017-01-21 DIAGNOSIS — M109 Gout, unspecified: Secondary | ICD-10-CM

## 2017-01-21 LAB — COMPREHENSIVE METABOLIC PANEL
ALT: 18 U/L (ref 6–29)
AST: 19 U/L (ref 10–35)
Albumin: 4 g/dL (ref 3.6–5.1)
Alkaline Phosphatase: 94 U/L (ref 33–130)
BUN: 20 mg/dL (ref 7–25)
CO2: 26 mmol/L (ref 20–31)
Calcium: 9.5 mg/dL (ref 8.6–10.4)
Chloride: 106 mmol/L (ref 98–110)
Creat: 1.02 mg/dL (ref 0.50–1.05)
Glucose, Bld: 113 mg/dL — ABNORMAL HIGH (ref 65–99)
Potassium: 4.2 mmol/L (ref 3.5–5.3)
Sodium: 141 mmol/L (ref 135–146)
Total Bilirubin: 0.4 mg/dL (ref 0.2–1.2)
Total Protein: 7.2 g/dL (ref 6.1–8.1)

## 2017-01-21 LAB — CBC WITH DIFFERENTIAL/PLATELET
Basophils Absolute: 98 cells/uL (ref 0–200)
Basophils Relative: 1 %
Eosinophils Absolute: 196 cells/uL (ref 15–500)
Eosinophils Relative: 2 %
HCT: 42.1 % (ref 35.0–45.0)
Hemoglobin: 13.7 g/dL (ref 11.7–15.5)
Lymphocytes Relative: 22 %
Lymphs Abs: 2156 cells/uL (ref 850–3900)
MCH: 28.8 pg (ref 27.0–33.0)
MCHC: 32.5 g/dL (ref 32.0–36.0)
MCV: 88.4 fL (ref 80.0–100.0)
MPV: 9.6 fL (ref 7.5–12.5)
Monocytes Absolute: 784 cells/uL (ref 200–950)
Monocytes Relative: 8 %
Neutro Abs: 6566 cells/uL (ref 1500–7800)
Neutrophils Relative %: 67 %
Platelets: 454 10*3/uL — ABNORMAL HIGH (ref 140–400)
RBC: 4.76 MIL/uL (ref 3.80–5.10)
RDW: 15.1 % — ABNORMAL HIGH (ref 11.0–15.0)
WBC: 9.8 10*3/uL (ref 4.0–10.5)

## 2017-01-21 LAB — LIPID PANEL
Cholesterol: 175 mg/dL (ref <200)
HDL: 60 mg/dL (ref >50)
LDL Cholesterol: 92 mg/dL (ref <100)
Total CHOL/HDL Ratio: 2.9 Ratio (ref <5.0)
Triglycerides: 113 mg/dL (ref <150)
VLDL: 23 mg/dL (ref <30)

## 2017-01-21 LAB — POCT URINALYSIS DIPSTICK
Bilirubin, UA: NEGATIVE
Blood, UA: NEGATIVE
Glucose, UA: NEGATIVE
Ketones, UA: NEGATIVE
Nitrite, UA: NEGATIVE
Protein, UA: NEGATIVE
Spec Grav, UA: 1.02
Urobilinogen, UA: NEGATIVE
pH, UA: 6

## 2017-01-21 LAB — TSH: TSH: 1.88 mIU/L

## 2017-01-21 LAB — HEMOGLOBIN A1C
Hgb A1c MFr Bld: 5.9 % — ABNORMAL HIGH (ref <5.7)
Mean Plasma Glucose: 123 mg/dL

## 2017-01-21 NOTE — Patient Instructions (Addendum)
  HEALTH MAINTENANCE RECOMMENDATIONS:  It is recommended that you get at least 30 minutes of aerobic exercise at least 5 days/week (for weight loss, you may need as much as 60-90 minutes). This can be any activity that gets your heart rate up. This can be divided in 10-15 minute intervals if needed, but try and build up your endurance at least once a week.  Weight bearing exercise is also recommended twice weekly.  Eat a healthy diet with lots of vegetables, fruits and fiber.  "Colorful" foods have a lot of vitamins (ie green vegetables, tomatoes, red peppers, etc).  Limit sweet tea, regular sodas and alcoholic beverages, all of which has a lot of calories and sugar.  Up to 1 alcoholic drink daily may be beneficial for women (unless trying to lose weight, watch sugars).  Drink a lot of water.  Calcium recommendations are 1200-1500 mg daily (1500 mg for postmenopausal women or women without ovaries), and vitamin D 1000 IU daily.  This should be obtained from diet and/or supplements (vitamins), and calcium should not be taken all at once, but in divided doses.  Monthly self breast exams and yearly mammograms for women over the age of 31 is recommended.  Sunscreen of at least SPF 30 should be used on all sun-exposed parts of the skin when outside between the hours of 10 am and 4 pm (not just when at beach or pool, but even with exercise, golf, tennis, and yard work!)  Use a sunscreen that says "broad spectrum" so it covers both UVA and UVB rays, and make sure to reapply every 1-2 hours.  Remember to change the batteries in your smoke detectors when changing your clock times in the spring and fall.  Use your seat belt every time you are in a car, and please drive safely and not be distracted with cell phones and texting while driving.  Consider the fibromyalgia vs other [facility].  I recommend getting the new shingles vaccine (Shingrix) when available. You will need to  check with your insurance to see if it is covered.  It is a series of 2 injections, spaced 2 months apart.  Follow up with the NP at Dr. Arvil Persons former practice sooner than June if still struggling with moods.

## 2017-01-22 LAB — URIC ACID: Uric Acid, Serum: 7.4 mg/dL — ABNORMAL HIGH (ref 2.5–7.0)

## 2017-01-25 ENCOUNTER — Other Ambulatory Visit: Payer: Self-pay

## 2017-01-25 MED ORDER — ALLOPURINOL 100 MG PO TABS: 100.0000 mg | ORAL_TABLET | Freq: Every day | ORAL | 2 refills | Status: DC

## 2017-01-25 NOTE — Progress Notes (Signed)
Spoke with pt about lab results and allopurinol called in per Dr. Tomi Bamberger request. [name]

## 2017-01-28 ENCOUNTER — Other Ambulatory Visit: Payer: Self-pay | Admitting: *Deleted

## 2017-01-28 DIAGNOSIS — M109 Gout, unspecified: Secondary | ICD-10-CM

## 2017-02-05 ENCOUNTER — Encounter: Payer: Self-pay | Admitting: Family Medicine

## 2017-02-14 NOTE — Progress Notes (Signed)
Office Visit Note  Patient: [name]             Date of Birth: 09/22/1956           MRN: [id number]             PCP: Vikki Ports, MD Referring: Rita Ohara, MD Visit Date: 02/21/2017 Occupation: _0 @    Subjective:  Generalized pain   History of Present Illness: [name] is a 61 y.o. female with history of fibromyalgia syndrome and osteoarthritis. She states she has had good and bad days. Her fibromyalgia flares off and on. He continues to have pain in her trapezius area and neck. She also has some lower back pain which radiates to her left lower extremity. She is a scheduled to have a lumbar spine injection. She rates  pain from fibromyalgia and scale of 0-10 about 3 and fatigue about 6.  Activities of Daily Living:  Patient reports morning stiffness for 2 minutes.   Patient Reports nocturnal pain.  Difficulty dressing/grooming: Denies Difficulty climbing stairs: Reports due to knee joint pain and shortness of breath Difficulty getting out of chair: Reports Difficulty using hands for taps, buttons, cutlery, and/or writing: Denies   Review of Systems  Constitutional: Positive for fatigue. Negative for night sweats, weight gain, weight loss and weakness.  HENT: Positive for mouth dryness. Negative for mouth sores, trouble swallowing, trouble swallowing and nose dryness.   Eyes: Positive for dryness. Negative for pain, redness and visual disturbance.  Respiratory: Positive for shortness of breath. Negative for cough and difficulty breathing.        Exertion  Cardiovascular: Negative for chest pain, palpitations, hypertension, irregular heartbeat and swelling in legs/feet.  Gastrointestinal: Negative for blood in stool, constipation and diarrhea.  Endocrine: Negative for increased urination.  Genitourinary: Negative for vaginal dryness.  Musculoskeletal: Positive for arthralgias, joint pain, myalgias, morning stiffness and myalgias. Negative for joint swelling, muscle  weakness and muscle tenderness.  Skin: Negative for color change, rash, hair loss, skin tightness, ulcers and sensitivity to sunlight.  Allergic/Immunologic: Negative for susceptible to infections.  Neurological: Negative for dizziness, memory loss and night sweats.  Hematological: Negative for swollen glands.  Psychiatric/Behavioral: Positive for sleep disturbance. Negative for depressed mood. The patient is nervous/anxious.     PMFS History:  Patient Active Problem List   Diagnosis Date Noted  . DJD (degenerative joint disease), cervical 12/24/2016  . Primary osteoarthritis of both hips 12/24/2016  . Primary osteoarthritis of both knees 12/24/2016  . H/O total knee replacement, right 12/24/2016  . Spondylosis of lumbar region without myelopathy or radiculopathy 12/24/2016  . Acute gout 05/30/2016  . Obstructive apnea 05/10/2016  . Myalgia 03/29/2016  . Other long term (current) drug therapy 03/29/2016  . Idiopathic peripheral neuropathy 03/29/2016  . Cannot sleep 03/29/2016  . Migraine without aura and responsive to treatment 03/29/2016  . Multifocal myoclonus 03/29/2016  . Restless leg 03/29/2016  . Has a tremor 03/29/2016  . Dyspnea 01/25/2016  . History of aspiration pneumonitis 01/25/2016  . History of acute bronchitis 01/25/2016  . LPRD (laryngopharyngeal reflux disease) 01/25/2016  . Imbalance 01/09/2016  . Serotonin syndrome 12/22/2015  . OA (osteoarthritis) of knee 09/05/2015  . Obesity, Class II, BMI 35-39.9, with comorbidity ([place]) 05/17/2015  . OSA (obstructive sleep apnea) 03/16/2013  . Iron deficiency 11/06/2012  . Thrombocythemia ([place]) 11/06/2012  . Elevated WBC count 11/05/2012  . Impaired fasting glucose 07/23/2012  . Bronchitis 04/03/2012  . Kidney stone on left  side 03/05/2012  . Kidney cysts 03/04/2012  . Loose stools 03/04/2012  . Urinary frequency 12/04/2011  . Restless leg syndrome 12/04/2011  . Polypharmacy 12/04/2011  . Bladder incontinence  12/04/2011  . Fibromyalgia   . Hyperlipidemia   . Edema   . S/P endometrial ablation   . Allergic rhinitis 09/18/2011  . Pneumonia, aspiration ([place]) 05/27/2011  . Depression, major, single episode, in partial remission ([place]) 04/21/2011  . OVERWEIGHT 02/17/2010  . PRECORDIAL PAIN 02/17/2010  . Anxiety state 01/30/2010  . Migraine 01/30/2010  . GERD 01/30/2010  . Gastroparesis 01/30/2010    Past Medical History:  Diagnosis Date  . Anemia    previously followed by Dr. Jamse Arn for anemia and elevated platelets  . Anxiety   . C. difficile colitis 10/01/12   treated by [facility]  . Chronic fatigue syndrome   . DDD (degenerative disc disease), lumbar 08/19/14   and facet arthroplasty & left lumbar radiculopathy (Dr.Ramos)  . Depression   . Dyssynergia    dyssynergenic defecation, contributing to fecal incontinence.  . Edema   . Fibromyalgia   . Gastroparesis    [facility] at [facility]  . GERD (gastroesophageal reflux disease)   . Hyperlipidemia   . Kidney stone   . Lumbar radiculopathy   . Migraine   . Neuropathy ([place])   . Obstructive sleep apnea    On Cpap 2009  . Paresthesia    Dr. Everette Rank at [facility]  . Pelvic floor dysfunction    pelvic floor dyssynergy  . Plantar fasciitis 02/2011   R foot  . Pneumonia    2012  . PONV (postoperative nausea and vomiting)    pt states has gastroparesis has difficulty taking antibiotics and narcotics has severe nausea and vomiting   . Restless leg syndrome   . S/P endometrial ablation 08/09/2006   Novasure Ablation  . S/P epidural steroid injection 09/20/14   Dr.Ramos  . Shortness of breath dyspnea    bending over; exertion   . Tremor    Dr. Everette Rank  . Urinary frequency   . Urinary incontinence     Family History  Problem Relation Age of Onset  . Allergies Mother   . Hypertension Mother   . Heart disease Mother     possible valve problem  . Macular degeneration Mother   . Heart disease Father     pacemaker, CHF  .  Hypertension Father   . Diabetes Father     borderline  . Stroke Father 73  . Kidney disease Father   . Asthma Sister   . Irritable bowel syndrome Sister   . Allergies Sister   . Heart disease Paternal Grandmother   . Heart disease Paternal Grandfather   . Cancer Maternal Aunt     leukemia  . Cancer Maternal Aunt   . Colon cancer Maternal Aunt     late 60's   Past Surgical History:  Procedure Laterality Date  . CHOLECYSTECTOMY  9/05  . ENDOMETRIAL ABLATION  08/09/2006   Dr. Valentina Shaggy Ablation  . KNEE SURGERY  1999   R knee, Dr. Eddie Dibbles, torn cartilage  . TONSILLECTOMY  1968  . TONSILLECTOMY    . TOTAL KNEE ARTHROPLASTY Right 09/05/2015   Procedure: RIGHT TOTAL KNEE ARTHROPLASTY;  Surgeon: Gaynelle Arabian, MD;  Location: [facility];  Service: Orthopedics;  Laterality: Right;   Social History   Social History Narrative   Married, 1 dog. 1 son in [place] (expecting first child end 06/2017), 1 stepson in [place], with  2 children     Objective: Vital Signs: BP 130/80   Pulse 80   Resp 14   Wt 223 lb (101.2 kg)   LMP 07/27/2006   BMI 40.14 kg/m    Physical Exam  Constitutional: She is oriented to person, place, and time. She appears well-developed and well-nourished.  HENT:  Head: Normocephalic and atraumatic.  Eyes: Conjunctivae and EOM are normal.  Neck: Normal range of motion.  Cardiovascular: Normal rate, regular rhythm, normal heart sounds and intact distal pulses.   Pulmonary/Chest: Effort normal and breath sounds normal.  Abdominal: Soft. Bowel sounds are normal.  Lymphadenopathy:    She has no cervical adenopathy.  Neurological: She is alert and oriented to person, place, and time.  Skin: Skin is warm and dry. Capillary refill takes less than 2 seconds.  Psychiatric: She has a normal mood and affect. Her behavior is normal.  Nursing note and vitals reviewed.    Musculoskeletal Exam: C-spine and thoracic lumbar spine all limited range of motion. Shoulder  joints elbow joints wrist joint MCPs PIPs DIPs are good range of motion with no synovitis. Hip joints were good range of motion. Minimal discomfort she's right total knee replacement was some warmth ankle joints MTPs PIPs with good range of motion. Fibromyalgia tender points were 16 out of 18 positive generalized hyperalgesia.  CDAI Exam: No CDAI exam completed.    Investigation: No additional findings.  Office Visit on 01/21/2017  Component Date Value Ref Range Status  . Color, UA 01/21/2017 yellow   Final  . Clarity, UA 01/21/2017 clear   Final  . Glucose, UA 01/21/2017 neg   Final  . Bilirubin, UA 01/21/2017 neg   Final  . Ketones, UA 01/21/2017 neg   Final  . Spec Grav, UA 01/21/2017 1.020   Final  . Blood, UA 01/21/2017 neg   Final  . pH, UA 01/21/2017 6.0   Final  . Protein, UA 01/21/2017 neg   Final  . Urobilinogen, UA 01/21/2017 negative   Final  . Nitrite, UA 01/21/2017 neg   Final  . Leukocytes, UA 01/21/2017 Trace* Negative Final  . Hgb A1c MFr Bld 01/21/2017 5.9* <5.7 % Final   Comment:   For someone without known diabetes, a hemoglobin A1c value between 5.7% and 6.4% is consistent with prediabetes and should be confirmed with a follow-up test.   For someone with known diabetes, a value <7% indicates that their diabetes is well controlled. A1c targets should be individualized based on duration of diabetes, age, co-morbid conditions and other considerations.   This assay result is consistent with an increased risk of diabetes.   Currently, no consensus exists regarding use of hemoglobin A1c for diagnosis of diabetes in children.     . Mean Plasma Glucose 01/21/2017 123  mg/dL Final  . TSH 01/21/2017 1.88  mIU/L Final   Comment:   Reference Range   > or = 20 Years  0.40-4.50   Pregnancy Range First trimester  0.26-2.66 Second trimester 0.55-2.73 Third trimester  0.43-2.91     . WBC 01/21/2017 9.8  4.0 - 10.5 K/uL Final  . RBC 01/21/2017 4.76  3.80 -  5.10 MIL/uL Final  . Hemoglobin 01/21/2017 13.7  11.7 - 15.5 g/dL Final  . HCT 01/21/2017 42.1  35.0 - 45.0 % Final  . MCV 01/21/2017 88.4  80.0 - 100.0 fL Final  . MCH 01/21/2017 28.8  27.0 - 33.0 pg Final  . MCHC 01/21/2017 32.5  32.0 - 36.0 g/dL Final  .  RDW 01/21/2017 15.1* 11.0 - 15.0 % Final  . Platelets 01/21/2017 454* 140 - 400 K/uL Final  . MPV 01/21/2017 9.6  7.5 - 12.5 fL Final  . Neutro Abs 01/21/2017 6566  1,500 - 7,800 cells/uL Final  . Lymphs Abs 01/21/2017 2156  850 - 3,900 cells/uL Final  . Monocytes Absolute 01/21/2017 784  200 - 950 cells/uL Final  . Eosinophils Absolute 01/21/2017 196  15 - 500 cells/uL Final  . Basophils Absolute 01/21/2017 98  0 - 200 cells/uL Final  . Neutrophils Relative % 01/21/2017 67  % Final  . Lymphocytes Relative 01/21/2017 22  % Final  . Monocytes Relative 01/21/2017 8  % Final  . Eosinophils Relative 01/21/2017 2  % Final  . Basophils Relative 01/21/2017 1  % Final  . Smear Review 01/21/2017 Criteria for review not met   Final  . Sodium 01/21/2017 141  135 - 146 mmol/L Final  . Potassium 01/21/2017 4.2  3.5 - 5.3 mmol/L Final  . Chloride 01/21/2017 106  98 - 110 mmol/L Final  . CO2 01/21/2017 26  20 - 31 mmol/L Final  . Glucose, Bld 01/21/2017 113* 65 - 99 mg/dL Final  . BUN 01/21/2017 20  7 - 25 mg/dL Final  . Creat 01/21/2017 1.02  0.50 - 1.05 mg/dL Final   Comment:   For patients > or = 60 years of age: The upper reference limit for Creatinine is approximately 13% higher for people identified as African-American.     . Total Bilirubin 01/21/2017 0.4  0.2 - 1.2 mg/dL Final  . Alkaline Phosphatase 01/21/2017 94  33 - 130 U/L Final  . AST 01/21/2017 19  10 - 35 U/L Final  . ALT 01/21/2017 18  6 - 29 U/L Final  . Total Protein 01/21/2017 7.2  6.1 - 8.1 g/dL Final  . Albumin 01/21/2017 4.0  3.6 - 5.1 g/dL Final  . Calcium 01/21/2017 9.5  8.6 - 10.4 mg/dL Final  . Cholesterol 01/21/2017 175  <200 mg/dL Final  . Triglycerides  01/21/2017 113  <150 mg/dL Final  . HDL 01/21/2017 60  >50 mg/dL Final  . Total CHOL/HDL Ratio 01/21/2017 2.9  <5.0 Ratio Final  . VLDL 01/21/2017 23  <30 mg/dL Final  . LDL Cholesterol 01/21/2017 92  <100 mg/dL Final  . Uric Acid, Serum 01/21/2017 7.4* 2.5 - 7.0 mg/dL Final   Imaging: No results found.  Speciality Comments: No specialty comments available.    Procedures:  No procedures performed Allergies: Erythromycin; Tramadol; and Dilaudid [hydromorphone hcl]   Assessment / Plan:     Visit Diagnoses: Fibromyalgia: She continues to have generalized pain and discomfort and myalgias with frequent flares.  H/O total knee replacement, right: She still has chronic pain and discomfort and some warmth  Spondylosis of lumbar region without myelopathy or radiculopathy an she's having increased lower back pain with left-sided radiculopathy. She is scheduled to have injection.  DJD (degenerative joint disease), cervical: She is having increased neck discomfort with trapezius pain. I offered cortisone injections but she declined. She may consider acupuncture for some relief.  Primary osteoarthritis of both hips: Chronic pain  Gout: Patient has been given allopurinol by her PCP. According to her gout has been well controlled.  Her other medical problems are listed as follows:   Restless leg syndrome  Myalgia  History of aspiration pneumonitis  History of gastroesophageal reflux (GERD)  History of migraine   Anxiety state  Mixed hyperlipidemia  Restless leg syndrome  OSA (  obstructive sleep apnea)  Obesity, Class II, BMI 35-39.9, with comorbidity (HCC)  Multifocal myoclonus  Orders: No orders of the defined types were placed in this encounter.  No orders of the defined types were placed in this encounter.   Face-to-face time spent with patient was 25 minutes. 50% of time was spent in counseling and coordination of care.  Follow-Up Instructions: Return in about 6  months (around 08/24/2017) for Osteoarthritis, FMS.   Bo Merino, MD  Note - This record has been created using Editor, commissioning.  Chart creation errors have been sought, but may not always  have been located. Such creation errors do not reflect on  the standard of medical care.

## 2017-02-21 ENCOUNTER — Encounter: Payer: Self-pay | Admitting: Rheumatology

## 2017-02-21 ENCOUNTER — Ambulatory Visit (INDEPENDENT_AMBULATORY_CARE_PROVIDER_SITE_OTHER): Payer: 59 | Admitting: Rheumatology

## 2017-02-21 VITALS — BP 130/80 | HR 80 | Resp 14 | Wt 223.0 lb

## 2017-02-21 DIAGNOSIS — M797 Fibromyalgia: Secondary | ICD-10-CM

## 2017-02-21 DIAGNOSIS — Z96651 Presence of right artificial knee joint: Secondary | ICD-10-CM

## 2017-02-21 DIAGNOSIS — M160 Bilateral primary osteoarthritis of hip: Secondary | ICD-10-CM | POA: Diagnosis not present

## 2017-02-21 DIAGNOSIS — M47816 Spondylosis without myelopathy or radiculopathy, lumbar region: Secondary | ICD-10-CM

## 2017-02-21 DIAGNOSIS — M47812 Spondylosis without myelopathy or radiculopathy, cervical region: Secondary | ICD-10-CM

## 2017-02-21 DIAGNOSIS — Z8719 Personal history of other diseases of the digestive system: Secondary | ICD-10-CM

## 2017-02-21 DIAGNOSIS — Z8669 Personal history of other diseases of the nervous system and sense organs: Secondary | ICD-10-CM

## 2017-02-21 DIAGNOSIS — M5030 Other cervical disc degeneration, unspecified cervical region: Secondary | ICD-10-CM

## 2017-02-21 DIAGNOSIS — M1A09X0 Idiopathic chronic gout, multiple sites, without tophus (tophi): Secondary | ICD-10-CM

## 2017-02-21 DIAGNOSIS — G2581 Restless legs syndrome: Secondary | ICD-10-CM | POA: Diagnosis not present

## 2017-03-05 ENCOUNTER — Telehealth: Payer: Self-pay | Admitting: *Deleted

## 2017-03-05 MED ORDER — GABAPENTIN 300 MG PO CAPS: 300.0000 mg | ORAL_CAPSULE | Freq: Three times a day (TID) | ORAL | 3 refills | Status: DC

## 2017-03-05 NOTE — Telephone Encounter (Addendum)
Received refill request for gabapentin 300mg . Dr. Milinda Pointer refilled as previously +3 additional and pt needs an appt prior to future refills.

## 2017-03-12 ENCOUNTER — Other Ambulatory Visit: Payer: Self-pay | Admitting: Cardiovascular Disease

## 2017-03-18 ENCOUNTER — Telehealth (INDEPENDENT_AMBULATORY_CARE_PROVIDER_SITE_OTHER): Payer: Self-pay | Admitting: Rheumatology

## 2017-03-18 MED ORDER — TOPIRAMATE 25 MG PO TABS
50.0000 mg | ORAL_TABLET | Freq: Every day | ORAL | 0 refills | Status: DC
Start: 2017-03-18 — End: 2017-07-17

## 2017-03-18 NOTE — Telephone Encounter (Signed)
[facility] request rf on pts Topiramate 25mg . States they faxed request twice, but faxed to incorrect fax number.

## 2017-03-18 NOTE — Telephone Encounter (Signed)
Last visit 01/2017  Next visit 08/23/17 Ok to refill per Dr Estanislado Pandy

## 2017-03-28 ENCOUNTER — Other Ambulatory Visit: Payer: 59

## 2017-03-28 DIAGNOSIS — M109 Gout, unspecified: Secondary | ICD-10-CM

## 2017-03-29 LAB — URIC ACID: Uric Acid, Serum: 6.4 mg/dL (ref 2.5–7.0)

## 2017-04-01 ENCOUNTER — Other Ambulatory Visit: Payer: Self-pay | Admitting: *Deleted

## 2017-04-01 MED ORDER — ALLOPURINOL 300 MG PO TABS: 300.0000 mg | ORAL_TABLET | Freq: Every day | ORAL | 0 refills | Status: DC

## 2017-04-11 ENCOUNTER — Other Ambulatory Visit: Payer: Self-pay | Admitting: Cardiovascular Disease

## 2017-04-16 ENCOUNTER — Other Ambulatory Visit: Payer: Self-pay | Admitting: Family Medicine

## 2017-04-16 DIAGNOSIS — Z1231 Encounter for screening mammogram for malignant neoplasm of breast: Secondary | ICD-10-CM

## 2017-04-17 HISTORY — PX: FACET JOINT INJECTION: SHX5016

## 2017-04-23 ENCOUNTER — Ambulatory Visit (INDEPENDENT_AMBULATORY_CARE_PROVIDER_SITE_OTHER): Payer: 59 | Admitting: Medical

## 2017-04-23 ENCOUNTER — Encounter: Payer: Self-pay | Admitting: Medical

## 2017-04-23 VITALS — BP 138/80 | HR 84 | Temp 98.4°F | Wt 220.6 lb

## 2017-04-23 DIAGNOSIS — R300 Dysuria: Secondary | ICD-10-CM

## 2017-04-23 DIAGNOSIS — R350 Frequency of micturition: Secondary | ICD-10-CM | POA: Diagnosis not present

## 2017-04-23 LAB — POCT URINALYSIS DIPSTICK
Bilirubin, UA: 1
Blood, UA: NEGATIVE
Glucose, UA: NEGATIVE
Ketones, UA: NEGATIVE
Leukocytes, UA: NEGATIVE
Nitrite, UA: NEGATIVE
Protein, UA: NEGATIVE
Spec Grav, UA: 1.01 (ref 1.010–1.025)
Urobilinogen, UA: NEGATIVE E.U./dL — AB
pH, UA: 5 (ref 5.0–8.0)

## 2017-04-23 MED ORDER — CIPROFLOXACIN HCL 500 MG PO TABS: 500.0000 mg | ORAL_TABLET | Freq: Two times a day (BID) | ORAL | 0 refills | Status: DC

## 2017-04-23 NOTE — Progress Notes (Signed)
Subjective:  [name] is a 61 y.o. female who complains of possible urinary tract infection.    Over the weekend had to put husband in the hospital for fever, possible urinary tract infection.  She notes urinary discomfort, urgency, frequency.  Had this about a week, took azo initially and it was helping.    Just started back on azo.  Last UTI was a "while" ago, last summer.  Saw urology last 06/2016,. But had stone that was causing some hematuria, stone had to be removed with procedure.    No current blood in urine, no fever, no NVD, no back pain.  No other aggravating or relieving factors. No other complaint.   Past Medical History:  Diagnosis Date  . Anemia    previously followed by Dr. Jamse Arn for anemia and elevated platelets  . Anxiety   . C. difficile colitis 10/01/12   treated by [facility]  . Chronic fatigue syndrome   . DDD (degenerative disc disease), lumbar 08/19/14   and facet arthroplasty & left lumbar radiculopathy (Dr.Ramos)  . Depression   . Dyssynergia    dyssynergenic defecation, contributing to fecal incontinence.  . Edema   . Fibromyalgia   . Gastroparesis    followed at [facility]  . GERD (gastroesophageal reflux disease)   . Hyperlipidemia   . Kidney stone   . Lumbar radiculopathy   . Migraine   . Neuropathy   . Obstructive sleep apnea    On Cpap 2009  . Paresthesia    Dr. Everette [facility] at [facility]  . Pelvic floor dysfunction    pelvic floor dyssynergy  . Plantar fasciitis 02/2011   R foot  . Pneumonia    2012  . PONV (postoperative nausea and vomiting)    pt states has gastroparesis has difficulty taking antibiotics and narcotics has severe nausea and vomiting   . Restless leg syndrome   . S/P endometrial ablation 08/09/2006   Novasure Ablation  . S/P epidural steroid injection 09/20/14   Dr.Ramos  . Shortness of breath dyspnea    bending over; exertion   . Tremor    Dr. Everette Rank  . Urinary frequency   . Urinary incontinence     ROS as in  subjective  Reviewed allergies, medications, past medical, surgical, and social history.    Objective: BP 138/80   Pulse 84   Temp 98.4 F (36.9 C)   Wt 220 lb 9.6 oz (100.1 kg)   LMP 07/27/2006   BMI 39.71 kg/m   General appearance: alert, no distress, WD/WN, female Abdomen: +bs, soft,mild RLQ tenderness, otherwise non tender, non distended, no masses, no hepatomegaly, no splenomegaly, no bruits Back: no CVA tenderness GU: deferred     Assessment: Encounter Diagnoses  Name Primary?  . Urine frequency Yes  . Dysuria      Plan: Discussed symptoms, diagnosis, possible complications, and usual course of illness.  Reviewed 06/2016 Urology notes.   Begin medication Cipro per orders below.  Advised increased water intake, can use OTC Tylenol for pain.   Urine culture sent.   Call or return if worse or not improving.

## 2017-04-24 LAB — URINE CULTURE

## 2017-05-10 ENCOUNTER — Ambulatory Visit
Admission: RE | Admit: 2017-05-10 | Discharge: 2017-05-10 | Disposition: A | Discharge status: Home / Self Care | Payer: 59 | Source: Ambulatory Visit | Attending: Family Medicine | Admitting: Family Medicine

## 2017-05-10 DIAGNOSIS — Z1231 Encounter for screening mammogram for malignant neoplasm of breast: Secondary | ICD-10-CM

## 2017-05-11 ENCOUNTER — Other Ambulatory Visit: Payer: Self-pay | Admitting: Family Medicine

## 2017-05-11 DIAGNOSIS — E785 Hyperlipidemia, unspecified: Secondary | ICD-10-CM

## 2017-05-20 ENCOUNTER — Other Ambulatory Visit: Payer: Self-pay | Admitting: Cardiovascular Disease

## 2017-05-22 ENCOUNTER — Encounter (HOSPITAL_COMMUNITY): Payer: Self-pay

## 2017-05-22 ENCOUNTER — Emergency Department (HOSPITAL_COMMUNITY)
Admission: EM | Admit: 2017-05-22 | Discharge: 2017-05-22 | Disposition: A | Discharge status: Home / Self Care | Payer: 59 | Attending: Emergency Medicine | Admitting: Emergency Medicine

## 2017-05-22 ENCOUNTER — Emergency Department (HOSPITAL_COMMUNITY): Payer: 59

## 2017-05-22 DIAGNOSIS — Z96651 Presence of right artificial knee joint: Secondary | ICD-10-CM | POA: Insufficient documentation

## 2017-05-22 DIAGNOSIS — R1031 Right lower quadrant pain: Secondary | ICD-10-CM | POA: Diagnosis present

## 2017-05-22 DIAGNOSIS — Z79899 Other long term (current) drug therapy: Secondary | ICD-10-CM | POA: Insufficient documentation

## 2017-05-22 LAB — CBC
HCT: 38.5 % (ref 36.0–46.0)
Hemoglobin: 12 g/dL (ref 12.0–15.0)
MCH: 28.4 pg (ref 26.0–34.0)
MCHC: 31.2 g/dL (ref 30.0–36.0)
MCV: 91.2 fL (ref 78.0–100.0)
Platelets: 355 10*3/uL (ref 150–400)
RBC: 4.22 MIL/uL (ref 3.87–5.11)
RDW: 15.2 % (ref 11.5–15.5)
WBC: 11 10*3/uL — ABNORMAL HIGH (ref 4.0–10.5)

## 2017-05-22 LAB — COMPREHENSIVE METABOLIC PANEL
ALT: 24 U/L (ref 14–54)
AST: 22 U/L (ref 15–41)
Albumin: 3.5 g/dL (ref 3.5–5.0)
Alkaline Phosphatase: 96 U/L (ref 38–126)
Anion gap: 9 (ref 5–15)
BUN: 16 mg/dL (ref 6–20)
CO2: 25 mmol/L (ref 22–32)
Calcium: 8.8 mg/dL — ABNORMAL LOW (ref 8.9–10.3)
Chloride: 107 mmol/L (ref 101–111)
Creatinine, Ser: 0.91 mg/dL (ref 0.44–1.00)
GFR calc Af Amer: >60 mL/min (ref >60)
GFR calc non Af Amer: >60 mL/min (ref >60)
Glucose, Bld: 104 mg/dL — ABNORMAL HIGH (ref 65–99)
Potassium: 3.9 mmol/L (ref 3.5–5.1)
Sodium: 141 mmol/L (ref 135–145)
Total Bilirubin: 0.4 mg/dL (ref 0.3–1.2)
Total Protein: 6.6 g/dL (ref 6.5–8.1)

## 2017-05-22 LAB — URINALYSIS, ROUTINE W REFLEX MICROSCOPIC
Bilirubin Urine: NEGATIVE
Glucose, UA: NEGATIVE mg/dL
Hgb urine dipstick: NEGATIVE
Ketones, ur: NEGATIVE mg/dL
Leukocytes, UA: NEGATIVE
Nitrite: NEGATIVE
Protein, ur: NEGATIVE mg/dL
Specific Gravity, Urine: 1.006 (ref 1.005–1.030)
pH: 6 (ref 5.0–8.0)

## 2017-05-22 LAB — LIPASE, BLOOD: Lipase: 30 U/L (ref 11–51)

## 2017-05-22 MED ORDER — IOPAMIDOL (ISOVUE-300) INJECTION 61%
INTRAVENOUS | Status: AC
  Filled 2017-05-22: qty 100

## 2017-05-22 MED ORDER — IOPAMIDOL (ISOVUE-300) INJECTION 61%
100.0000 mL | Freq: Once | INTRAVENOUS | Status: AC | PRN
  Administered 2017-05-22: 100 mL via INTRAVENOUS

## 2017-05-22 MED ORDER — ACETAMINOPHEN 500 MG PO TABS
1000.0000 mg | ORAL_TABLET | Freq: Once | ORAL | Status: AC
  Administered 2017-05-22: 1000 mg via ORAL
  Filled 2017-05-22: qty 2

## 2017-05-22 NOTE — Progress Notes (Signed)
Chief Complaint  Patient presents with  . Hospitalization Follow-up    ER follow up. Stomach is better but lower back is not. Thinks meloxicam is what caused the initial pain-stopped taking.    Patient presents for ER f/u from yesterday, where she was seen with back and RLQ pain.  She started with right back/ flank pain, which then migrated to right lower quadrant.  Back pain started 4d prior to ER visit, went to ER when spread to abdomen.  She had been having normal bowel movements. She wasn't having any urinary symptoms (just had trouble giving a sample so they did in and out cath).  Pain started acutely with leaning to do something for her mother on Saturday 6/23. It was short-lived and resolved, but pain recurred on 6/24 and has persisted since then.  She thinks her stomach was hurting due to meloxicam--that was the only thing different that she had taken.  Today her abdominal pain is gone, but she continues to have right back pain. Pain is 7/10 with movement, less with sitting.   She tried heat, which helps temporarily. She has also used a topical cream (?sombra?), which helps some.  She had been taking meloxicam for this flank/black pain prior to going to the ER.  Didn't think it was very helpful, took it for 3 days (Sun/Mon/Tues).  Since onset of pain on 6/23,  She takes methocarbamol BID, just prn, using it regularly since Sunday.  She called Dr. Nelva Bush when the pain started in her back. She has an appointment for Monday.  ER labs/imaging reviewed: Lab Results  Component Value Date   WBC 11.0 (H) 05/22/2017   HGB 12.0 05/22/2017   HCT 38.5 05/22/2017   MCV 91.2 05/22/2017   PLT 355 05/22/2017     Chemistry      Component Value Date/Time   NA 141 05/22/2017 0148   K 3.9 05/22/2017 0148   CL 107 05/22/2017 0148   CO2 25 05/22/2017 0148   BUN 16 05/22/2017 0148   CREATININE 0.91 05/22/2017 0148   CREATININE 1.02 01/21/2017 1118      Component Value Date/Time   CALCIUM 8.8  (L) 05/22/2017 0148   ALKPHOS 96 05/22/2017 0148   AST 22 05/22/2017 0148   ALT 24 05/22/2017 0148   BILITOT 0.4 05/22/2017 0148     Normal lipase Normal urine dip yesterday.   CT abdomen: IMPRESSION: Fat containing umbilical hernia. Nonvisualization of the appendix. No inflammatory changes to suggest appendicitis are noted. 4 mm nodule in the left lower lobe. No follow-up needed if patient is low-risk. Non-contrast chest CT can be considered in 12 months if patient is high-risk. She is a nonsmoker.  PMH, PSH, SH reviewed.  Outpatient Encounter Prescriptions as of 05/23/2017  Medication Sig  . acetaminophen (TYLENOL) 500 MG tablet Take 500-1,000 mg by mouth every 6 (six) hours as needed for mild pain, moderate pain, fever or headache. Reported on 01/02/2016  . allopurinol (ZYLOPRIM) 300 MG tablet Take 1 tablet (300 mg total) by mouth daily.  Marland Kitchen ALPRAZolam (XANAX) 0.25 MG tablet Take 0.25 mg by mouth 3 (three) times daily as needed for anxiety.   . ARIPiprazole (ABILIFY) 5 MG tablet Take 5 mg by mouth daily.  . Armodafinil (NUVIGIL) 250 MG tablet Take 250 mg by mouth daily with breakfast.  . atorvastatin (LIPITOR) 40 MG tablet TAKE 1 TABLET EVERY DAY  . cetirizine (ZYRTEC) 10 MG tablet Take 10 mg by mouth at bedtime.  . cholecalciferol (VITAMIN  D) 1000 units tablet Take 1,000 Units by mouth daily.  . DULoxetine (CYMBALTA) 60 MG capsule Take 60 mg by mouth 2 (two) times daily.   . fluticasone (FLONASE) 50 MCG/ACT nasal spray PLACE 2 SPRAYS INTO BOTH NOSTRILS DAILY.  Marland Kitchen gabapentin (NEURONTIN) 300 MG capsule Take 1 capsule (300 mg total) by mouth at bedtime. (Patient taking differently: Take 300-600 mg by mouth 2 (two) times daily. Takes 300 mg in the morning and 600 mg at bedtime)  . methocarbamol (ROBAXIN) 500 MG tablet TAKE 1 TABLET AT 7AM AND AT 2PM  . metoprolol tartrate (LOPRESSOR) 50 MG tablet TAKE 1 TABLET TWICE A DAY  . Multiple Vitamins-Minerals (PRESERVISION AREDS PO) Take 1  capsule by mouth 2 (two) times daily. Reported on 01/02/2016  . nortriptyline (PAMELOR) 25 MG capsule Take 75-100 mg by mouth at bedtime.  . pantoprazole (PROTONIX) 40 MG tablet Take 40 mg by mouth 2 (two) times daily.  . Probiotic Product ([place]) CAPS Take 1 capsule by mouth daily. Reported on 01/02/2016  . ranitidine (ZANTAC) 300 MG tablet Take 300 mg by mouth at bedtime.  . topiramate (TOPAMAX) 25 MG tablet Take 2 tablets (50 mg total) by mouth at bedtime.  . [DISCONTINUED] dronabinol (MARINOL) 5 MG capsule Take 5 mg by mouth 2 (two) times daily as needed (for nausea).   Marland Kitchen albuterol (PROVENTIL HFA;VENTOLIN HFA) 108 (90 Base) MCG/ACT inhaler Inhale into the lungs every 6 (six) hours as needed for wheezing or shortness of breath. Reported on 05/30/2016  . colchicine 0.6 MG tablet Take 2 tablets by mouth at onset of gout flare. Taken an additional tablet 1 hour later, if needed. May take like this every 3d, if needed (Patient not taking: Reported on 05/23/2017)  . ondansetron (ZOFRAN) 8 MG tablet Take 8 mg by mouth every 8 (eight) hours as needed for nausea or vomiting. Reported on 05/09/2016  . prochlorperazine (COMPAZINE) 10 MG tablet Take 10 mg by mouth every 6 (six) hours as needed for nausea or vomiting. Reported on 05/09/2016  . promethazine (PHENERGAN) 25 MG suppository Place 25 mg rectally every 6 (six) hours as needed for nausea or vomiting. Reported on 05/09/2016  . [DISCONTINUED] Armodafinil 250 MG tablet Take 1 tablet (250 mg total) by mouth daily.  . [DISCONTINUED] ciprofloxacin (CIPRO) 500 MG tablet Take 1 tablet (500 mg total) by mouth 2 (two) times daily. (Patient not taking: Reported on 05/22/2017)   No facility-administered encounter medications on file as of 05/23/2017.    Allergies  Allergen Reactions  . Erythromycin Nausea Only  . Tramadol Itching  . Dilaudid [Hydromorphone Hcl] Itching   ROS:  No fever, chills.  Mild nausea--was worse when pain worse, back to  baseline. No vomiting.  Abdominal pain improved.  Last bowel movement was last night.  Denies any bleeding. No urinary complaints, no rashes  PHYSICAL EXAM:  BP 128/80 (BP Location: Left Arm, Patient Position: Sitting, Cuff Size: Normal)   Pulse 80   Temp 98.6 F (37 C) (Tympanic)   Ht 5' 2.5" (1.588 m)   Wt 226 lb 12.8 oz (102.9 kg)   LMP 07/27/2006   BMI 40.82 kg/m   Well appearing female in no distress HEENT: conjunctiva and sclera are clear, EOMI Neck: no lymphadenopathy or mass Heart: regular rate and rhythm Lungs: clear bilaterally Back: no spinal tenderness, SI tenderness or CVA tenderness.  She is tender at the right lower lumbar paraspinous muscles. Abdomen: mildly tender in epigastrium and LLQ. No RLQ tenderness.  No rebound or guarding, no mass Extremities: no edema Psych:  Normal mood, affect, hygiene and grooming Neuro: alert and oriented cranial nerves intact normal gait.  Normal sensation, DTR's, negative SLR Skin: normal turgor, no lesion/rash  ASSESSMENT/PLAN:  Acute right-sided low back pain without sciatica - muscular per exam.  heat/stretch/massage/topical med/muscle relaxant. f/u with Dr. Nelva Bush if persists, as scheduled  Abdominal pain, unspecified abdominal location - RLQ pain resolved, mildly tender LLQ.  no worrisome pathology suspected    I suspect that your low back pain is muscular.  Continue with the heat and topical medications.  Try some gentle stretches and massage. Continue to use the methocarbamol--you take a third dose (using it every 8 hours), if needed for severe pain. You also could re-try the meloxicam, if needed.  Usually this causes upper abdominal pain, not lower like you had.  Be sure to take it with food. Continue tylenol as needed for pain as well.  F/u with Dr. Nelva Bush on Monday, as scheduled.

## 2017-05-22 NOTE — Discharge Instructions (Signed)
Take Tylenol every 4 hours as needed for pain. Call Dr. Tomi Bamberger to schedule an office visit if pain continues in the next one or 2 days. If concern for any reason

## 2017-05-22 NOTE — ED Provider Notes (Signed)
[facility] DEPT Provider Note   CSN: [id number] Arrival date & time: 05/22/17  0113     History   Chief Complaint Chief Complaint  Patient presents with  . Abdominal Pain    HPI [name] is a 61 y.o. female.  HPI Complains of right flank pain has which has since migrated to right lower quadrant onset 4 days ago. Pain is only present when she changes position. Improved with remaining still Associated symptoms include nausea no vomiting. She ate crackers yesterday she is not presently hungry. No fever. No treatment prior to coming here . He last bowel movement yesterday, normal. States she's been unable to urinate. Past Medical History:  Diagnosis Date  . Anemia    previously followed by Dr. Jamse Arn for anemia and elevated platelets  . Anxiety   . C. difficile colitis 10/01/12   treated by [facility]  . Chronic fatigue syndrome   . DDD (degenerative disc disease), lumbar 08/19/14   and facet arthroplasty & left lumbar radiculopathy (Dr.Ramos)  . Depression   . Dyssynergia    dyssynergenic defecation, contributing to fecal incontinence.  . Edema   . Fibromyalgia   . Gastroparesis    followed at [facility]  . GERD (gastroesophageal reflux disease)   . Hyperlipidemia   . Kidney stone   . Lumbar radiculopathy   . Migraine   . Neuropathy   . Obstructive sleep apnea    On Cpap 2009  . Paresthesia    Dr. Everette Rank at [facility]  . Pelvic floor dysfunction    pelvic floor dyssynergy  . Plantar fasciitis 02/2011   R foot  . Pneumonia    2012  . PONV (postoperative nausea and vomiting)    pt states has gastroparesis has difficulty taking antibiotics and narcotics has severe nausea and vomiting   . Restless leg syndrome   . S/P endometrial ablation 08/09/2006   Novasure Ablation  . S/P epidural steroid injection 09/20/14   Dr.Ramos  . Shortness of breath dyspnea    bending over; exertion   . Tremor    Dr. Everette Rank  . Urinary frequency   . Urinary incontinence      Patient Active Problem List   Diagnosis Date Noted  . DJD (degenerative joint disease), cervical 12/24/2016  . Primary osteoarthritis of both hips 12/24/2016  . Primary osteoarthritis of both knees 12/24/2016  . H/O total knee replacement, right 12/24/2016  . Spondylosis of lumbar region without myelopathy or radiculopathy 12/24/2016  . Acute gout 05/30/2016  . Obstructive apnea 05/10/2016  . Myalgia 03/29/2016  . Other long term (current) drug therapy 03/29/2016  . Idiopathic peripheral neuropathy 03/29/2016  . Cannot sleep 03/29/2016  . Migraine without aura and responsive to treatment 03/29/2016  . Multifocal myoclonus 03/29/2016  . Restless leg 03/29/2016  . Has a tremor 03/29/2016  . Dyspnea 01/25/2016  . History of aspiration pneumonitis 01/25/2016  . History of acute bronchitis 01/25/2016  . LPRD (laryngopharyngeal reflux disease) 01/25/2016  . Imbalance 01/09/2016  . Serotonin syndrome 12/22/2015  . OA (osteoarthritis) of knee 09/05/2015  . Obesity, Class II, BMI 35-39.9, with comorbidity ([place]) 05/17/2015  . OSA (obstructive sleep apnea) 03/16/2013  . Iron deficiency 11/06/2012  . Thrombocythemia ([place]) 11/06/2012  . Elevated WBC count 11/05/2012  . Impaired fasting glucose 07/23/2012  . Bronchitis 04/03/2012  . Kidney stone on left side 03/05/2012  . Kidney cysts 03/04/2012  . Loose stools 03/04/2012  . Urinary frequency 12/04/2011  . Restless leg syndrome 12/04/2011  .  Polypharmacy 12/04/2011  . Bladder incontinence 12/04/2011  . Fibromyalgia   . Hyperlipidemia   . Edema   . S/P endometrial ablation   . Allergic rhinitis 09/18/2011  . Pneumonia, aspiration ([place]) 05/27/2011  . Depression, major, single episode, in partial remission ([place]) 04/21/2011  . OVERWEIGHT 02/17/2010  . PRECORDIAL PAIN 02/17/2010  . Anxiety state 01/30/2010  . Migraine 01/30/2010  . GERD 01/30/2010  . Gastroparesis 01/30/2010    Past Surgical History:  Procedure Laterality  Date  . CHOLECYSTECTOMY  9/05  . ENDOMETRIAL ABLATION  08/09/2006   Dr. Valentina Shaggy Ablation  . FACET JOINT INJECTION  04/17/2017   Left L4-5 and L5-S1  . KNEE SURGERY  1999   R knee, Dr. Eddie Dibbles, torn cartilage  . TONSILLECTOMY  1968  . TONSILLECTOMY    . TOTAL KNEE ARTHROPLASTY Right 09/05/2015   Procedure: RIGHT TOTAL KNEE ARTHROPLASTY;  Surgeon: Gaynelle Arabian, MD;  Location: [facility] ORS;  Service: Orthopedics;  Laterality: Right;    OB History    Gravida Para Term Preterm AB Living   1 1 1     1    SAB TAB Ectopic Multiple Live Births                   Home Medications    Prior to Admission medications   Medication Sig Start Date End Date Taking? Authorizing Provider  acetaminophen (TYLENOL) 500 MG tablet Take 1,000 mg by mouth every 6 (six) hours as needed for mild pain. Reported on 01/02/2016    [provider]  albuterol (PROVENTIL HFA;VENTOLIN HFA) 108 (90 Base) MCG/ACT inhaler Inhale into the lungs every 6 (six) hours as needed for wheezing or shortness of breath. Reported on 05/30/2016    [provider]  allopurinol (ZYLOPRIM) 300 MG tablet Take 1 tablet (300 mg total) by mouth daily. 04/01/17   Rita Ohara, MD  ALPRAZolam Duanne Moron) 0.25 MG tablet Take 0.25 mg by mouth 3 (three) times daily as needed for anxiety.     [provider]  Armodafinil 250 MG tablet Take 1 tablet (250 mg total) by mouth daily. 12/26/16   Bo Merino, MD  atorvastatin (LIPITOR) 40 MG tablet TAKE 1 TABLET EVERY DAY 05/13/17   Rita Ohara, MD  cetirizine (ZYRTEC) 10 MG tablet Take 10 mg by mouth at bedtime.    [provider]  cholecalciferol (VITAMIN D) 1000 units tablet Take 1,000 Units by mouth daily.    [provider]  ciprofloxacin (CIPRO) 500 MG tablet Take 1 tablet (500 mg total) by mouth 2 (two) times daily. 04/23/17   Tysinger, Camelia Eng, PA-C  colchicine 0.6 MG tablet Take 2 tablets by mouth at onset of gout flare. Taken an additional tablet 1 hour  later, if needed. May take like this every 3d, if needed 12/19/16   Rita Ohara, MD  dronabinol (MARINOL) 5 MG capsule Take 5 mg by mouth daily before lunch.  01/26/16   [provider]  DULoxetine (CYMBALTA) 60 MG capsule Take 60 mg by mouth 2 (two) times daily.  05/15/16   [provider]  fluticasone (FLONASE) 50 MCG/ACT nasal spray PLACE 2 SPRAYS INTO BOTH NOSTRILS DAILY. 11/20/16   Rita Ohara, MD  gabapentin (NEURONTIN) 300 MG capsule Take 1 capsule (300 mg total) by mouth at bedtime. Patient taking differently: Take 900 mg by mouth 1 day or 1 dose.  05/17/16   Hyatt, Max T, DPM  meclizine (ANTIVERT) 25 MG tablet Take 1 tablet (25 mg total) by mouth  2 (two) times daily. For vertigo/dizzy 09/18/16   Tysinger, Camelia Eng, PA-C  meloxicam (MOBIC) 15 MG tablet Take 15 mg by mouth as needed.  08/28/16   [provider]  methocarbamol (ROBAXIN) 500 MG tablet TAKE 1 TABLET AT 7AM AND AT [facility] 01/17/17   Bo Merino, MD  metoprolol tartrate (LOPRESSOR) 50 MG tablet TAKE 1 TABLET TWICE A DAY 04/11/17   Josue Hector, MD  Multiple Vitamins-Minerals (PRESERVISION AREDS PO) Take 1 capsule by mouth 2 (two) times daily. Reported on 01/02/2016    [provider]  nortriptyline (PAMELOR) 50 MG capsule TAKE 3-4 CAPSULES BY MOUTH AT BEDTIME 01/04/17   [provider]  ondansetron (ZOFRAN) 8 MG tablet Take 8 mg by mouth every 8 (eight) hours as needed for nausea or vomiting. Reported on 05/09/2016    [provider]  pantoprazole (PROTONIX) 40 MG tablet Take 40 mg by mouth 2 (two) times daily.    [provider]  Probiotic Product ([place]) CAPS Take 1 capsule by mouth daily. Reported on 01/02/2016    [provider]  prochlorperazine (COMPAZINE) 10 MG tablet Take 10 mg by mouth every 6 (six) hours as needed for nausea or vomiting. Reported on 05/09/2016    [provider]  promethazine (PHENERGAN) 25 MG suppository Place 25 mg  rectally every 6 (six) hours as needed for nausea or vomiting. Reported on 05/09/2016    [provider]  topiramate (TOPAMAX) 25 MG tablet Take 2 tablets (50 mg total) by mouth at bedtime. 03/18/17   Bo Merino, MD    Family History Family History  Problem Relation Age of Onset  . Allergies Mother   . Hypertension Mother   . Heart disease Mother        possible valve problem  . Macular degeneration Mother   . Heart disease Father        pacemaker, CHF  . Hypertension Father   . Diabetes Father        borderline  . Stroke Father 10  . Kidney disease Father   . Asthma Sister   . Irritable bowel syndrome Sister   . Allergies Sister   . Heart disease Paternal Grandmother   . Heart disease Paternal Grandfather   . Cancer Maternal Aunt        leukemia  . Cancer Maternal Aunt   . Colon cancer Maternal Aunt        late 49's    Social History Social History  Substance Use Topics  . Smoking status: Never Smoker  . Smokeless tobacco: Never Used  . Alcohol use No     Allergies   Erythromycin; Tramadol; and Dilaudid [hydromorphone hcl]   Review of Systems Review of Systems  Constitutional: Negative.   HENT: Negative.   Respiratory: Negative.   Cardiovascular: Negative.   Gastrointestinal: Positive for abdominal pain and nausea.  Musculoskeletal: Negative.   Skin: Negative.   Neurological: Negative.   Psychiatric/Behavioral: Negative.   All other systems reviewed and are negative.    Physical Exam Updated Vital Signs BP (!) 129/44 (BP Location: Left Arm)   Pulse 65   Temp 97.7 F (36.5 C) (Oral)   Resp 18   LMP 07/27/2006   SpO2 100%   Physical Exam  Constitutional: She appears well-developed and well-nourished.  HENT:  Head: Normocephalic and atraumatic.  Eyes: Conjunctivae are normal. Pupils are equal, round, and reactive to light.  Neck: Neck supple. No tracheal deviation present. No thyromegaly present.  Cardiovascular: Normal rate and  regular rhythm.   No murmur heard. Pulmonary/Chest: Effort normal and breath sounds normal.  Abdominal: Soft. Bowel sounds are normal. She exhibits no distension. There is no tenderness.  Morbidly obese, umbilical hernia soft easily reducible. Not warm or red. Minimal tenderness at right lower quadrant. No flank tenderness  Musculoskeletal: Normal range of motion. She exhibits no edema or tenderness.  Neurological: She is alert. Coordination normal.  Skin: Skin is warm and dry. No rash noted.  Psychiatric: She has a normal mood and affect.  Nursing note and vitals reviewed.    ED Treatments / Results  Labs (all labs ordered are listed, but only abnormal results are displayed) Labs Reviewed  COMPREHENSIVE METABOLIC PANEL - Abnormal; Notable for the following:       Result Value   Glucose, Bld 104 (*)    Calcium 8.8 (*)    All other components within normal limits  CBC - Abnormal; Notable for the following:    WBC 11.0 (*)    All other components within normal limits  LIPASE, BLOOD  URINALYSIS, ROUTINE W REFLEX MICROSCOPIC    EKG  EKG Interpretation None       Radiology No results found.  Procedures Procedures (including critical care time)  Medications Ordered in ED Medications - No data to display   Initial Impression / Assessment and Plan / ED Course  I have reviewed the triage vital signs and the nursing notes.  Pertinent labs & imaging results that were available during my care of the patient were reviewed by me and considered in my medical decision making (see chart for details).    11: 25 AM pain at right lower quadrant change. Doubt appendicitis. Plan Tylenol for pain. Follow-up with Dr.Knapp   Final Clinical Impressions(s) / ED Diagnoses  Diagnosis right lower  quadrant abdominal pain Final diagnoses:  None    New Prescriptions New Prescriptions   No medications on file     Orlie Dakin, MD 05/22/17 1150

## 2017-05-22 NOTE — ED Notes (Signed)
BLADDER SCAN RESULTS 85mL's

## 2017-05-22 NOTE — ED Triage Notes (Signed)
Pt c/o RLQ abdominal pain since Saturday, worse when bending forward, somewhat tender. Pt states she has some nausea, no vomiting or diarrhea. States she has gastroparesis, this does not feel like that.

## 2017-05-23 ENCOUNTER — Ambulatory Visit (INDEPENDENT_AMBULATORY_CARE_PROVIDER_SITE_OTHER): Payer: 59 | Admitting: Family Medicine

## 2017-05-23 ENCOUNTER — Encounter: Payer: Self-pay | Admitting: Family Medicine

## 2017-05-23 VITALS — BP 128/80 | HR 80 | Temp 98.6°F | Ht 62.5 in | Wt 226.8 lb

## 2017-05-23 DIAGNOSIS — R109 Unspecified abdominal pain: Secondary | ICD-10-CM

## 2017-05-23 DIAGNOSIS — M545 Low back pain, unspecified: Secondary | ICD-10-CM

## 2017-05-23 NOTE — Patient Instructions (Signed)
I suspect that your low back pain is muscular.  Continue with the heat and topical medications.  Try some gentle stretches and massage. Continue to use the methocarbamol--you take a third dose (using it every 8 hours), if needed for severe pain. You also could re-try the meloxicam, if needed.  Usually this causes upper abdominal pain, not lower like you had.  Be sure to take it with food. Continue tylenol as needed for pain as well.  F/u with Dr. Nelva Bush on Monday, as scheduled.

## 2017-05-31 ENCOUNTER — Encounter: Payer: 59 | Admitting: Gynecology

## 2017-06-10 ENCOUNTER — Telehealth: Payer: Self-pay | Admitting: Family Medicine

## 2017-06-10 NOTE — Telephone Encounter (Signed)
She had a TdaP in 2012, so is up to date on the pertussis booster.  (it is most important for the pregnant woman to get the Tdap during pregnancy, as the antibodies help protect the baby).  So, she really doesn't need one.  If she would like another, I'm okay with NV for another TdaP (technically not needed)

## 2017-06-10 NOTE — Telephone Encounter (Signed)
Pt will be having a grand child on 07/04/17 and it has been recommended that she get a Dtap since pregnancy is high risk. Can she come by and get this done? Call pt on her cell # today.

## 2017-06-10 NOTE — Telephone Encounter (Signed)
Patient advised that she is UTD-she will not be coming in for NV.

## 2017-06-16 ENCOUNTER — Other Ambulatory Visit: Payer: Self-pay | Admitting: Cardiovascular Disease

## 2017-06-17 ENCOUNTER — Other Ambulatory Visit: Payer: Self-pay | Admitting: Pulmonary Disease

## 2017-06-17 MED ORDER — ALBUTEROL SULFATE HFA 108 (90 BASE) MCG/ACT IN AERS: 1.0000 | INHALATION_SPRAY | Freq: Four times a day (QID) | RESPIRATORY_TRACT | 3 refills | Status: DC | PRN

## 2017-06-19 ENCOUNTER — Ambulatory Visit (INDEPENDENT_AMBULATORY_CARE_PROVIDER_SITE_OTHER): Payer: 59 | Admitting: Pulmonary Disease

## 2017-06-19 ENCOUNTER — Encounter: Payer: Self-pay | Admitting: Pulmonary Disease

## 2017-06-19 ENCOUNTER — Telehealth: Payer: Self-pay | Admitting: Family Medicine

## 2017-06-19 VITALS — BP 122/86 | HR 79 | Temp 97.8°F | Ht 62.5 in | Wt 217.0 lb

## 2017-06-19 DIAGNOSIS — K2100 Gastro-esophageal reflux disease with esophagitis, without bleeding: Secondary | ICD-10-CM

## 2017-06-19 DIAGNOSIS — G4733 Obstructive sleep apnea (adult) (pediatric): Secondary | ICD-10-CM | POA: Diagnosis not present

## 2017-06-19 DIAGNOSIS — Z8709 Personal history of other diseases of the respiratory system: Secondary | ICD-10-CM

## 2017-06-19 DIAGNOSIS — K3184 Gastroparesis: Secondary | ICD-10-CM | POA: Diagnosis not present

## 2017-06-19 DIAGNOSIS — IMO0001 Reserved for inherently not codable concepts without codable children: Secondary | ICD-10-CM

## 2017-06-19 DIAGNOSIS — K219 Gastro-esophageal reflux disease without esophagitis: Secondary | ICD-10-CM | POA: Diagnosis not present

## 2017-06-19 DIAGNOSIS — E669 Obesity, unspecified: Secondary | ICD-10-CM | POA: Diagnosis not present

## 2017-06-19 DIAGNOSIS — R0600 Dyspnea, unspecified: Secondary | ICD-10-CM | POA: Diagnosis not present

## 2017-06-19 NOTE — Progress Notes (Signed)
Subjective:     Patient ID: [name], female   DOB: May 12, 1956, 61 y.o.   MRN: [id number]  HPI    61 y/o WF, referred by [name] for eval of dyspnea=> felt to be multifactorial w/ components from OSA, obesity w/ an element of pulmonary restrictive dis, GERD/ gastroparesis w/ hx aspiration (followed at [facility]), deconditioning due to sedentary lifestyle, and anxiety w/ "chest wall musc spasm";  She has numerous other medical issues including FM/ CFS, neuropathy, migraines, DJD- s/p TKR...  ~  January 25, 2016:  Initial pulmonary consult by SN>  She is the sister of [name] who works in our allergy clinic...    61 y/o WF prev eval by PW in 2013 w/ SOB/ breathing issues (GERD/ LPR w/ ?microaspiration) & currently followed by PCP- Dr.[name];  Pt has no other hx of lung dis but sister [name] has Asthma;  [name] is obese (wt=208# in a 5'2" frame for a BMI=38);  She has known OSA on CPAP- prev eval & rx by [name] but this was yrs ago, pt not currently using it regularly, states she does better when using it regularly, and notes "I have CFS also"... [name] gives a quite rambling hx-- states several months of SOB/ DOE, CP; [facility] "no blood clots and the ruled out my heart";  Given inhaler (no benefit) & late Pred (this helped);  Then end of [id number]- ER eval, MRI, Neurology & Psyche called, ?withdrawals, adm ICU [facility], stopped all meds, disch w/ home PT/OT...  She notes breathing is better now- prev describes "couldn't get my air IN", "couldn't get a deep breath" "didn't feel satisfied breathing";  She notes some cough, occas wheezing, no sput, no hemoptysis, some sharp CP/ tenderness over sternum...  Smoking Hx>  She is a NEVER smoker...  Pulmonary Hx>   She has hx OSA w/ initial Sleep eval by [name], on CPAP x yrs she says; had pneumonia x1 in 2012; no hx asthma, recurrent bronchitic infections, no hx TB or known exposure...    Cardiac Hx>  Cards eval by [name], last  seen  2DEcho 07/2015 (done for dyspnea) showed   Medical Hx>  PCP=[name] on Atorva for Chol;  She has a hx gastroparesis w/ eval [facility]- [name] on Protonix40Bid, Compazine, Zofran;  Hx anxiety/ depression prev Dr [name] w/ Abilify, Nortrip, Cymbalta, Alprazolam...  Family Hx>  + HBP, +DM in father, heart dis, stroke; sister has asthma, IBS...  Occup Hx>  [employer], married, 2 children  Current Meds>  Flonase, Proair, Metoprolol, Lipitor, Protonix, Zofran, Compazine, Xanax, Pamelor, Cymbalta EXAM shows Afeb, VSS, O2sat=96% on RA;  208#, 5'3"Tall, BMI=37;  HEENT- neg, mallampati4, short/fat neck;  Chest- decr excursions, clear w/o w/r/r;  Cor- RR w/o m/r/g;  Abd- obese, soft, +pannuiculus;  Ext- neg w/o c/c/e;  Neuro- intact.   2DEcho 08/23/15 showed mod focal basal hypertrophy, norm LVF w/ EF=60-65%, no regional wall motion abn, Gr1DD, lipomatous hypertrophy of the atrial septum, valves ok, RV ok   EKG 12/22/15 showed NSR, rate100, rightward axis, low voltage, otherw wnl...  Last CXR 12/23/16>  Norm heart size, mild chr peribronchial thickening, clear- NAD, obese soft tissues...   CT Angio Chest 11/27/15 via ER> NEG for PE, vessels are ok, clear lungs w/ mild basilar atx, no adenopathy, s/pGB, pancreas & adrenals ok, old fx betw manubrium & body of the sternum,   Spirometry 01/25/16>  FVC=2.29 (78%), FEV1=1.92 (81%), %1sec=84, mid-flows wnl at  87% predicted;  This is c/w borderline restriction (need lung volume measurement for confirmation)...   Ambulatory Oximetry 01/25/16> O2sat=97% on RA at rest w/ HR=65/min; she ambulated 3 Laps (185'ea) w/ lowest O2sat=96% w/ HR=89/min...  LABS Jan-Feb2017 in Epic>  Chems- wnl x Alb=3.3;  CBC- mild anemia w/ Hg=11.6, mcv=88;  TSH=1.88  IMP>>    Dyspnea-- this is multifactorial w/ OSA, pulm restriction from obesity, deconditioning, & "chest wall factors" all playing a roll.    Hx aspiration pneumonia    Known GERD/Gastroparesis & ? LPR-- she needs a  vigorous antireflux regimen w/ Protonix40Bid, NPO after dinner, elev HOB 6", etc...    OSA-- we do not have any old data, therefore needs re-assessment & restart CPAP based on her current situation.    OBESITY-- obviously DIET & EXERCISE are the keys, but due to her health status she should consider Bariatric surg.    Hx RLS-- I assume this was a finding on her previous sleep eval, not on specific rx for this at present, ?followed by Neuro?    Medical issues-- HL, DJD w/ R-TKR in 2016, Gout, FM/CFS, Migraines, ?Neuropathy, Anxiety/Depression;  Her PCP=DrEveKnapp, Neuro= [facility] DrLTonuzi PLAN>>      61 presents w/ multifactorial dyspnea and I explained that there is no easy/ quick fix;  The good news is that herCXR, PFT & oxygenaton are normal indicating that her lungs are healthy;  To help her w/ the dyspnea she needs to do the following:  1)  Must lose weight- diet & exercise are the keys but Bariatric surg would be a reasonable approach if she has been unable to successfully lose wt in the pat;  2) she must improve her conditioning- this requires a regular exercise program & she cannot do this on her own, needs help of family/ trainer/ etc & suggested to join the Y/ silver smeakers/ etc;  3)  For the chest wall factor-- I find that Alprazolam/ Klonopin are the best meds to allow a deeper breath & help the pt feel more satisfied breathing, & it may take a dose adjustment to help maximally;  4)  Must follow a vigorous antireflux regimen-- Protonix40Bid, NPO after dinner, elevate HOB 6"...   ~  June 19, 2017:  61 y.o. ROV & [name] is here to get a refill of her Proair rescue inhaler (I reminded her that this could easily be refilled by her PCP going forward);  I reviewed [facility] records going back 1.5 yrs>>    She is followed by [name] & last seen 01/07/17>  She's had an extensive eval for her GASTROPARESIS & severe nausea- gastic empty study, EGD, Ba swallow, pH study &  manometry; on Protonix40Bid, Zantac300Hs, plus Compazine/ Phenergan/ Zofran & even Marinol in the [past;      She is followed by DrDeveshwar for Rheum & last seen 02/21/17> hx OA, s/p R-TKR 08/2015 by DrAlusio, Hx gout on Allopurinol per PCP, and FM; notes good days and bad, has some LBP (lumbar spondylosis) w/ radiation to left leg & has received ESI [facility] 04/17/17;     She continues to f/u w/ [facility]-DrTonuzi & last seen 05/09/17>  Neuropathy, migraines, tremor, FM, depression, etc;  Pt also has OSA w/ remote eval many yrs ago by [name] but hasn't followed up in yrs and DrTonuzi apparently has not picked up on or re-evaluated her OSA (see below);  Pt c/o dizziness, neuropathy symptoms, gait abn; on Nu-vigil, Pamelor, Topamax.Marland KitchenMarland Kitchen  I called [facility] at [phone]  They checked their records and tell me that orig sleep study by [name] was 2008 w/ AHI=21.9;  Pt received a replacement CPAP machine 04/01/13 ordered by [name]- [employer]Med S9, set at [facility] & she has apparently never had a download checked (Pt indicated to me that she thought she got a new machine last yr);  Pt states that she keeps up w/ mask & tubing changes (uses nasal pillows) but [facility] says that her supplies were refilled- 02/2017, 06/2015, 06/2014... She indicates that she uses the CPAP Qhs & it seems to be working well to her- rests satis, wakes refreshed, min daytime sleepiness, rarely naps, no prob w/ driving etc... She indicates to me that Ovidio Kin is managing her sleep apnea, restless legs, etc...     She last saw PCP-[name] 05/23/17>  Follow up visit after ER eval for back pain & RLQ pain, note reviewed & mult somatic complaints seem to come & go; ER eval included CT Abd&Pelvis- tiny 77mm nodular density RLL, mild fatty liver, s/pGB, 2.5cm fat containing umbil hernia, otherw NEG- NAD;  LABS in ER were also wnl;  She sees DrRamos for pain management- on topical preps + Tylenol, Robaxin, etc...    Note:  Pt  uses to see [name] who has moved her practice & currently sees "Rollene Fare" in their office for her meds- Abilify, Cymbalta, Xanax...     From the pulmonary standpoint [place] has few complaints- says she had a "flair-up" last weekend & reached for her rescue inhaler (ProairHFA) but it was empty=> sched this follow up appt;  We reviewed our prev Pulm eval, diagnoses, & recommendations-- she has had a difficult time w/ diet & not able to exercise she says; weight is up 10# from last yr & BMI=40; she did not inquire about Bariatric surg stating that her GI team didn't think it was a good idea; she does follow the antireflux regimen w/ Protonix, Zantac, NPO after dinner & elev HOB; she has not been taking the Alprazolam regularly (prefers prn route)...    EXAM shows Afeb, VSS, O2sat=99% on RA;  217#, 5'3"Tall, BMI=40;  HEENT- neg, mallampati4, short/fat neck, parotid hypertrophy;  Chest- decr excursions, clear w/o w/r/r;  Cor- RR w/o m/r/g;  Abd- obese, soft, +pannuiculus;  Ext- neg w/o c/c/e;  Neuro- intact.  IMP/PLAN>>  I stressed to [place] the importance of diet/ exercise/ wt reduction; she desperately needs to lose the weight & start moving; I think it would e helpful to talk to the Bariatric surgeons about her situation, but she wants to clear it w/ her gastroenterologists first... She will maintain her vigorous antireflux regimen, and try to incr the Alprazolam as a trial to 0.25mg  Bid-Tid to see if this helps her breathing (allowing her to get a better, deeper breath & feel more satisfied breathing... We refilled her ProairHFA rescue inhaler per her request...       Past Medical History:  Diagnosis Date  . Anemia    previously followed by Dr. Jamse Arn for anemia and elevated platelets  . Anxiety   . C. difficile colitis 10/01/12   treated by [facility]  . Chronic fatigue syndrome   . DDD (degenerative disc disease), lumbar 08/19/14   and facet arthroplasty & left lumbar radiculopathy (Dr.Ramos)  .  Depression   . Dyssynergia    dyssynergenic defecation, contributing to fecal incontinence.  . Edema   . Fibromyalgia   . Gastroparesis    followed at [facility]  .  GERD (gastroesophageal reflux disease)   . Hyperlipidemia   . Kidney stone   . Lumbar radiculopathy   . Migraine   . Neuropathy   . Obstructive sleep apnea    On Cpap 2009  . Paresthesia    Dr. Everette Rank at [facility]  . Pelvic floor dysfunction    pelvic floor dyssynergy  . Plantar fasciitis 02/2011   R foot  . Pneumonia    2012  . PONV (postoperative nausea and vomiting)    pt states has gastroparesis has difficulty taking antibiotics and narcotics has severe nausea and vomiting   . Restless leg syndrome   . S/P endometrial ablation 08/09/2006   Novasure Ablation  . S/P epidural steroid injection 09/20/14   Dr.Ramos  . Shortness of breath dyspnea    bending over; exertion   . Tremor    Dr. Everette Rank  . Urinary frequency   . Urinary incontinence     Past Surgical History:  Procedure Laterality Date  . CHOLECYSTECTOMY  9/05  . ENDOMETRIAL ABLATION  08/09/2006   Dr. Valentina Shaggy Ablation  . FACET JOINT INJECTION  04/17/2017   Left L4-5 and L5-S1  . KNEE SURGERY  1999   R knee, Dr. Eddie Dibbles, torn cartilage  . TONSILLECTOMY  1968  . TONSILLECTOMY    . TOTAL KNEE ARTHROPLASTY Right 09/05/2015   Procedure: RIGHT TOTAL KNEE ARTHROPLASTY;  Surgeon: Gaynelle Arabian, MD;  Location: [facility];  Service: Orthopedics;  Laterality: Right;    Outpatient Encounter Prescriptions as of 06/19/2017  Medication Sig  . acetaminophen (TYLENOL) 500 MG tablet Take 500-1,000 mg by mouth every 6 (six) hours as needed for mild pain, moderate pain, fever or headache. Reported on 01/02/2016  . albuterol (PROVENTIL HFA;VENTOLIN HFA) 108 (90 Base) MCG/ACT inhaler Inhale 1-2 puffs into the lungs every 6 (six) hours as needed for wheezing or shortness of breath. Reported on 05/30/2016  . allopurinol (ZYLOPRIM) 300 MG tablet Take 1 tablet (300  mg total) by mouth daily.  Marland Kitchen ALPRAZolam (XANAX) 0.25 MG tablet Take 0.25 mg by mouth 3 (three) times daily as needed for anxiety.   . ARIPiprazole (ABILIFY) 5 MG tablet Take 5 mg by mouth daily.  . Armodafinil (NUVIGIL) 250 MG tablet Take 250 mg by mouth daily with breakfast.  . atorvastatin (LIPITOR) 40 MG tablet TAKE 1 TABLET EVERY DAY  . cetirizine (ZYRTEC) 10 MG tablet Take 10 mg by mouth at bedtime.  . cholecalciferol (VITAMIN D) 1000 units tablet Take 1,000 Units by mouth daily.  . colchicine 0.6 MG tablet Take 2 tablets by mouth at onset of gout flare. Taken an additional tablet 1 hour later, if needed. May take like this every 3d, if needed  . DULoxetine (CYMBALTA) 60 MG capsule Take 60 mg by mouth 2 (two) times daily.   . fluticasone (FLONASE) 50 MCG/ACT nasal spray PLACE 2 SPRAYS INTO BOTH NOSTRILS DAILY.  Marland Kitchen gabapentin (NEURONTIN) 300 MG capsule Take 1 capsule (300 mg total) by mouth at bedtime. (Patient taking differently: Take 300-600 mg by mouth 2 (two) times daily. Takes 300 mg in the morning and 600 mg at bedtime)  . methocarbamol (ROBAXIN) 500 MG tablet TAKE 1 TABLET AT 7AM AND AT 2PM  . metoprolol tartrate (LOPRESSOR) 50 MG tablet TAKE 1 TABLET TWICE A DAY  . Multiple Vitamins-Minerals (PRESERVISION AREDS PO) Take 1 capsule by mouth 2 (two) times daily. Reported on 01/02/2016  . nortriptyline (PAMELOR) 25 MG capsule Take 75-100 mg by mouth at bedtime.  . ondansetron (  ZOFRAN) 8 MG tablet Take 8 mg by mouth every 8 (eight) hours as needed for nausea or vomiting. Reported on 05/09/2016  . pantoprazole (PROTONIX) 40 MG tablet Take 40 mg by mouth 2 (two) times daily.  . Probiotic Product (Thomas) CAPS Take 1 capsule by mouth daily. Reported on 01/02/2016  . prochlorperazine (COMPAZINE) 10 MG tablet Take 10 mg by mouth every 6 (six) hours as needed for nausea or vomiting. Reported on 05/09/2016  . promethazine (PHENERGAN) 25 MG suppository Place 25 mg rectally every 6 (six)  hours as needed for nausea or vomiting. Reported on 05/09/2016  . ranitidine (ZANTAC) 300 MG tablet Take 300 mg by mouth at bedtime.  . topiramate (TOPAMAX) 25 MG tablet Take 2 tablets (50 mg total) by mouth at bedtime.   No facility-administered encounter medications on file as of 06/19/2017.     Allergies  Allergen Reactions  . Erythromycin Nausea Only  . Tramadol Itching  . Dilaudid [Hydromorphone Hcl] Itching    Immunization History  Administered Date(s) Administered  . Influenza Split 09/17/2011, 09/03/2012, 09/04/2013  . Influenza,inj,Quad PF,36+ Mos 08/16/2014, 07/27/2015  . Influenza-Unspecified 10/04/2016  . Tdap 01/16/2011    Current Medications, Allergies, Past Medical History, Past Surgical History, Family History, and Social History were reviewed in [employer] record.   Review of Systems             All symptoms NEG except where BOLDED >>  Constitutional:  F/C/S, fatigue, anorexia, unexpected weight change. HEENT:  HA, visual changes, hearing loss, earache, nasal symptoms, sore throat, mouth sores, hoarseness. Resp:  cough, sputum, hemoptysis; SOB, tightness, wheezing. Cardio:  CP, palpit, DOE, orthopnea, edema. GI:  N/V/D/C, blood in stool; reflux, abd pain, distention, gas. GU:  dysuria, freq, urgency, hematuria, flank pain, voiding difficulty. MS:  joint pain, swelling, tenderness, decr ROM; neck pain, back pain, etc. Neuro:  HA, tremors, seizures, dizziness, syncope, weakness, numbness, gait abn. Skin:  suspicious lesions or skin rash. Heme:  adenopathy, bruising, bleeding. Psyche:  confusion, agitation, sleep disturbance, hallucinations, anxiety, depression suicidal.   Objective:   Physical Exam       Vital Signs:  Reviewed...   General:  WD, obese, 61 y/o WM in NAD; alert & oriented; pleasant & cooperative... HEENT:  [place]/AT; Conjunctiva- pink, Sclera- nonicteric, EOM-wnl, PERRLA; EACs-clear, TMs-wnl; NOSE-clear; THROAT-clear & wnl.   Neck:  Supple w/ fair ROM; no JVD; normal carotid impulses w/o bruits; no thyromegaly or nodules palpated; no lymphadenopathy.  Chest:  Decr BS at bases, clear to P & A; without wheezes, rales, or rhonchi heard. Heart:  Regular Rhythm; norm S1 & S2 without murmurs, rubs, or gallops detected. Abdomen:  Obese, soft & nontender- no guarding or rebound; normal bowel sounds; no organomegaly or masses palpated. Ext:  Decr ROM; without deformities +arthritic changes; no varicose veins, +venous insuffic, tr edema;  Pulses intact w/o bruits. Neuro:  CNs II-XII intact; motor testing normal; sensory testing normal; gait abnormal & balance is fair. Derm:  No lesions noted; no rash etc. Lymph:  No cervical, supraclavicular, axillary, or inguinal adenopathy palpated.  Assessment:      IMP>>    Dyspnea-- this is multifactorial w/ OSA, pulm restriction from obesity, GERD, deconditioning, & "chest wall factors" all playing a roll.    Hx aspiration pneumonia    Known GERD/Gastroparesis & ?LPR-- she needs a vigorous antireflux regimen w/ Protonix40Bid, NPO after dinner, elev HOB 6", etc...    OSA-- initial eval [name] 2008, PCP-[name]  ordered new machine 2014, on CPAP 13 w/ nasal pillows 7 pt feels she is doing satis; no download avail to review.    OBESITY-- obviously DIET & EXERCISE are the keys, but due to her health status she should consider Bariatric surg.    Hx RLS-- I assume this was a finding on her previous sleep eval, followed by PCP & Neuro on Nuvigil...    Medical issues-- HL, DJD w/ R-TKR in 2016, Gout, FM/CFS, Migraines, ?Neuropathy, Anxiety/Depression;  Her PCP=DrEveKnapp, Neuro= [facility] DrLTonuzi; Rheum=DrDeveshwar  PLAN>>   01/25/16>   Cierrah presents w/ multifactorial dyspnea and I explained that there is no easy/ quick fix;  The good news is that herCXR, PFT & oxygenaton are normal indicating that her lungs are healthy;  To help her w/ the dyspnea she needs to do the  following:  1)  Must lose weight- diet & exercise are the keys but Bariatric surg would be a reasonable approach if she has been unable to successfully lose wt in the pat;  2) she must improve her conditioning- this requires a regular exercise program & she cannot do this on her own, needs help of family/ trainer/ etc & suggested to join the Y/ silver smeakers/ etc;  3)  For the chest wall factor-- I find that Alprazolam/ Klonopin are the best meds to allow a deeper breath & help the pt feel more satisfied breathing, & it may take a dose adjustment to help maximally;  4)  Must follow a vigorous antireflux regimen-- Protonix40Bid, NPO after dinner, elevate HOB 6"... 06/19/17>   I stressed to [place] the importance of diet/ exercise/ wt reduction; she desperately needs to lose the weight & start moving; I think it would e helpful to talk to the Bariatric surgeons about her situation, but she wants to clear it w/ her gastroenterologists first... She will maintain her vigorous antireflux regimen, and try to incr the Alprazolam as a trial to 0.25mg  Bid-Tid to see if this helps her breathing (allowing her to get a better, deeper breath & feel more satisfied breathing... We refilled her ProairHFA rescue inhaler per her request.   Plan:     Patient's Medications  New Prescriptions   No medications on file  Previous Medications   ACETAMINOPHEN (TYLENOL) 500 MG TABLET    Take 500-1,000 mg by mouth every 6 (six) hours as needed for mild pain, moderate pain, fever or headache. Reported on 01/02/2016   ALBUTEROL (PROVENTIL HFA;VENTOLIN HFA) 108 (90 BASE) MCG/ACT INHALER    Inhale 1-2 puffs into the lungs every 6 (six) hours as needed for wheezing or shortness of breath. Reported on 05/30/2016   ALLOPURINOL (ZYLOPRIM) 300 MG TABLET    Take 1 tablet (300 mg total) by mouth daily.   ALPRAZOLAM (XANAX) 0.25 MG TABLET    Take 0.25 mg by mouth 3 (three) times daily as needed for anxiety.    ARIPIPRAZOLE (ABILIFY) 5 MG TABLET     Take 5 mg by mouth daily.   ARMODAFINIL (NUVIGIL) 250 MG TABLET    Take 250 mg by mouth daily with breakfast.   ATORVASTATIN (LIPITOR) 40 MG TABLET    TAKE 1 TABLET EVERY DAY   CETIRIZINE (ZYRTEC) 10 MG TABLET    Take 10 mg by mouth at bedtime.   CHOLECALCIFEROL (VITAMIN D) 1000 UNITS TABLET    Take 1,000 Units by mouth daily.   COLCHICINE 0.6 MG TABLET    Take 2 tablets by mouth at onset of gout flare.  Taken an additional tablet 1 hour later, if needed. May take like this every 3d, if needed   DULOXETINE (CYMBALTA) 60 MG CAPSULE    Take 60 mg by mouth 2 (two) times daily.    FLUTICASONE (FLONASE) 50 MCG/ACT NASAL SPRAY    PLACE 2 SPRAYS INTO BOTH NOSTRILS DAILY.   GABAPENTIN (NEURONTIN) 300 MG CAPSULE    Take 1 capsule (300 mg total) by mouth at bedtime.   METHOCARBAMOL (ROBAXIN) 500 MG TABLET    TAKE 1 TABLET AT 7AM AND AT 2PM   METOPROLOL TARTRATE (LOPRESSOR) 50 MG TABLET    TAKE 1 TABLET TWICE A DAY   MULTIPLE VITAMINS-MINERALS (PRESERVISION AREDS PO)    Take 1 capsule by mouth 2 (two) times daily. Reported on 01/02/2016   NORTRIPTYLINE (PAMELOR) 25 MG CAPSULE    Take 75-100 mg by mouth at bedtime.   ONDANSETRON (ZOFRAN) 8 MG TABLET    Take 8 mg by mouth every 8 (eight) hours as needed for nausea or vomiting. Reported on 05/09/2016   PANTOPRAZOLE (PROTONIX) 40 MG TABLET    Take 40 mg by mouth 2 (two) times daily.   PROBIOTIC PRODUCT ([place] COLON HEALTH) CAPS    Take 1 capsule by mouth daily. Reported on 01/02/2016   PROCHLORPERAZINE (COMPAZINE) 10 MG TABLET    Take 10 mg by mouth every 6 (six) hours as needed for nausea or vomiting. Reported on 05/09/2016   PROMETHAZINE (PHENERGAN) 25 MG SUPPOSITORY    Place 25 mg rectally every 6 (six) hours as needed for nausea or vomiting. Reported on 05/09/2016   RANITIDINE (ZANTAC) 300 MG TABLET    Take 300 mg by mouth at bedtime.   TOPIRAMATE (TOPAMAX) 25 MG TABLET    Take 2 tablets (50 mg total) by mouth at bedtime.  Modified Medications   No medications  on file  Discontinued Medications   No medications on file

## 2017-06-19 NOTE — Telephone Encounter (Signed)
Pt states she is having [place] child and is going to be preemie and [place] told her she had Tdap in 2012 & they are good for 10 years but the baby's doctor wants her to have a booster and would like to come here and have that done.  Is this ok?

## 2017-06-19 NOTE — Telephone Encounter (Signed)
[place] for [place] for TdaP

## 2017-06-19 NOTE — Patient Instructions (Signed)
Today we updated your med list in our EPIC system...    Continue your current medications the same...  We reviewed your shortness of breath complaint>    We discussed the need for DIET, EXERCISE, and weight reduction...    Consider the bariatric surgery approach if weight loss isn't poss through other means...    We reviewed the antireflux regimen w/ your PROTONIX, do not eat or drink after dinner, elev the head of bed...    Finally we discussed the ALPRAZOLAM 0.25mg  tabd taking at least 2/d in mid-morning & mid-afternoon...  Call for any questions or if I can be of service in any way.Marland KitchenMarland Kitchen

## 2017-06-20 ENCOUNTER — Other Ambulatory Visit (INDEPENDENT_AMBULATORY_CARE_PROVIDER_SITE_OTHER): Payer: 59

## 2017-06-20 ENCOUNTER — Ambulatory Visit: Payer: 59 | Admitting: Pulmonary Disease

## 2017-06-20 DIAGNOSIS — Z23 Encounter for immunization: Secondary | ICD-10-CM

## 2017-06-20 NOTE — Telephone Encounter (Signed)
Pt coming in today

## 2017-07-06 ENCOUNTER — Other Ambulatory Visit: Payer: Self-pay | Admitting: Family Medicine

## 2017-07-08 ENCOUNTER — Telehealth: Payer: Self-pay

## 2017-07-08 NOTE — Telephone Encounter (Signed)
Is this okay for me to do for her?

## 2017-07-08 NOTE — Telephone Encounter (Signed)
Chart reviewed--looks like last refill request to them in 03/2017 they stated visit was needed for more refills.  Looks like she hasn't seen them since 09/2015.  Maybe I missed something, but I didn't see where it said f/u not needed. I saw it said f/u prn in 2016, which usually means once yearly, sooner if needed.  I'm happy to discuss this with her at her med check in August.  Okay to refill just one month, if running out.  Has OV 8/30

## 2017-07-08 NOTE — Telephone Encounter (Signed)
Pt needs refill of metoprolol sent to [facility]. She states heart specialist said he did not need to see her again, and asking Dr. Tomi Bamberger to fill for her [phone]. Victor[place] December

## 2017-07-10 ENCOUNTER — Other Ambulatory Visit: Payer: Self-pay | Admitting: *Deleted

## 2017-07-10 ENCOUNTER — Encounter: Payer: Self-pay | Admitting: Gynecology

## 2017-07-10 ENCOUNTER — Ambulatory Visit (INDEPENDENT_AMBULATORY_CARE_PROVIDER_SITE_OTHER): Payer: 59 | Admitting: Gynecology

## 2017-07-10 VITALS — BP 124/82 | Ht 63.0 in | Wt 227.0 lb

## 2017-07-10 DIAGNOSIS — K429 Umbilical hernia without obstruction or gangrene: Secondary | ICD-10-CM

## 2017-07-10 DIAGNOSIS — N952 Postmenopausal atrophic vaginitis: Secondary | ICD-10-CM

## 2017-07-10 DIAGNOSIS — Z01411 Encounter for gynecological examination (general) (routine) with abnormal findings: Secondary | ICD-10-CM

## 2017-07-10 MED ORDER — METOPROLOL TARTRATE 50 MG PO TABS: 50.0000 mg | ORAL_TABLET | Freq: Two times a day (BID) | ORAL | 0 refills | Status: DC

## 2017-07-10 NOTE — Telephone Encounter (Signed)
Patient ask you to please call her on her cell she said she went to pick up med and it was not there

## 2017-07-10 NOTE — Telephone Encounter (Signed)
Called patient and sent.

## 2017-07-10 NOTE — Progress Notes (Signed)
    [name] December 22, 1955 [id number]        61 y.o.  [name] for annual exam.    Past medical history,surgical history, problem list, medications, allergies, family history and social history were all reviewed and documented as reviewed in the EPIC chart.  ROS:  Performed with pertinent positives and negatives included in the history, assessment and plan.   Additional significant findings :  None   Exam: [name] assistant Vitals:   07/10/17 1035  BP: 124/82  Weight: 227 lb (103 kg)  Height: 5\' 3"  (1.6 m)   Body mass index is 40.21 kg/m.  General appearance:  Normal affect, orientation and appearance. Skin: Grossly normal HEENT: Without gross lesions.  No cervical or supraclavicular adenopathy. Thyroid normal.  Lungs:  Clear without wheezing, rales or rhonchi Cardiac: RR, without RMG Abdominal:  Soft, nontender, without masses, guarding, rebound, organomegaly. Umbilical hernia noted, easily reducible Breasts:  Examined lying and sitting without masses, retractions, discharge or axillary adenopathy. Pelvic:  Ext, BUS, Vagina: With atrophic changes  Cervix: With atrophic changes  Uterus: Difficult to palpate but no gross masses or tenderness   Adnexa: Without gross masses or tenderness    Anus and perineum: Normal   Rectovaginal: Normal sphincter tone without palpated masses or tenderness.    Assessment/Plan:  61 y.o. G53P1001 female for annual exam.   1. Postmenopausal/atrophic genital changes. No significant hot flushes, night sweats, vaginal dryness or any vaginal bleeding. Continue to monitor report any issues or bleeding. 2. Umbilical hernia, easily reducible. Present for years not bothersome to the patient. Continue to monitor. Signs and symptoms of incarceration reviewed. 3. Mammography 04/2017. Continue with annual mammography when due. Breast exam normal today. 4. Pap smear 2016. No Pap smear done today. Plan repeat Pap smear next year at 3 year interval per  current screening guidelines. 5. DEXA 2010 normal. Recommend repeat DEXA now at age 61. Patient agrees to schedule an follow up for this. 6. Colonoscopy 2014. Repeat at their recommended interval. 7. Health maintenance. No routine lab work done as patient does this at her primary physician's office. Follow up for bone density. Follow up in one year for annual exam.   [name], 11:02 AM 07/10/2017

## 2017-07-10 NOTE — Patient Instructions (Signed)
Followup for bone density as scheduled. 

## 2017-07-17 ENCOUNTER — Other Ambulatory Visit (INDEPENDENT_AMBULATORY_CARE_PROVIDER_SITE_OTHER): Payer: Self-pay | Admitting: Rheumatology

## 2017-07-17 NOTE — Telephone Encounter (Signed)
Last Visit: 02/21/17 Next Visit: 08/23/17  Okay to refill per Dr. Estanislado Pandy

## 2017-07-23 ENCOUNTER — Other Ambulatory Visit: Payer: Self-pay | Admitting: Gynecology

## 2017-07-23 ENCOUNTER — Encounter: Payer: Self-pay | Admitting: Gynecology

## 2017-07-23 ENCOUNTER — Ambulatory Visit (INDEPENDENT_AMBULATORY_CARE_PROVIDER_SITE_OTHER): Payer: 59

## 2017-07-23 DIAGNOSIS — Z780 Asymptomatic menopausal state: Secondary | ICD-10-CM | POA: Diagnosis not present

## 2017-07-23 DIAGNOSIS — Z13820 Encounter for screening for osteoporosis: Secondary | ICD-10-CM

## 2017-07-23 DIAGNOSIS — Z01411 Encounter for gynecological examination (general) (routine) with abnormal findings: Secondary | ICD-10-CM

## 2017-07-23 LAB — HM DEXA SCAN: HM Dexa Scan: NORMAL

## 2017-07-24 NOTE — Progress Notes (Deleted)
She had recent GYN exam with Dr. Phineas Real.  DEXA was recommended.  Hypertension: (never formally diagnosed, because had been on diuretics for fluid retention (stopped a while ago) and beta blockers for tremor--but high BP's noted when not taking these medications in past).  She periodically checks her BP at home, running 130's/70's, sometimes up to 140. She reports sometimes her pulse at home is higher, around 100 (previously evaluated by cardiologist). She denies side effects or muscle cramping. She hasn't been on diuretics for a while, and denies any edema.  Allergies: doing pretty well currently. Using Flonase and zyrtec every day.  Gout:  Allopurinol dose was increased from 235m to 3026min May, after uric acid level remained >6.  Due for recheck today. Lab Results  Component Value Date   LABURIC 6.4 03/28/2017   Pre-diabetes: Lab Results  Component Value Date   HGBA1C 5.9 (H) 01/21/2017     OSA--on CPAP, tolerating well. She uses albuterol prn. She last saw pulm in July and had proAir refilled. They also discussed her CPAP use.   Hyperlipidemia follow-up: Patient is trying to follow a low-fat, low cholesterol diet as best she can. Compliant with medications and denies medication side effects.  Lab Results  Component Value Date   CHOL 175 01/21/2017   HDL 60 01/21/2017   LDLCALC 92 01/21/2017   TRIG 113 01/21/2017   CHOLHDL 2.9 01/21/2017    Migraines:Takes topamax for prevention (also on pamelor). Previously took imitrex for migraines (stopped related to serotonin syndrome/hospitalization). Gets migraines about once a month, relieved by OTC medication most of the time.  Tremor--on beta blocker. Sometimes it is bad (hard to send text message), but for the most part it is well controlled. Worse in the right hand.  Neuropathy in her feet--Currently on gabapentin (30050mn am, 600m81mS).  She still finds the neuropathy bothersome at bedtime.  (previously took  Lyrica, only tolerated at 75mg19m.) She last saw Dr. TonuzValaria Goodune. He was concerned she may be having some side effects from nortriptyline dosing. As tolerated, may wish to attempt nortriptyline taper, decreased from 4 to 3/day  RLS: This is controlled, just rare.  She is now taking Gabapentin ?? And back on clonazepam per Dr. TonuzValaria GoodTE??  Gastroparesis:As mentioned at her January visit, she had a tough year related to her gastroparesis. She was sent to another specialist, Dr. Fina,Roney Manshin [facility] [place], for abnormalities on manometry study. She had endoscopy, manometry and pH probe in December. She reports had ongoing reflux (11% of the time) despite her current regimen.   Fibromyalgia: treated by Dr. DevesEstanislado Pandy was last seen 12/26/16 and had left shoulder cortisone injection. She is having more fibromyalgia pain overall (all trigger points were positive).  She is still having some shoulder pain. Her right knee replacement continues to do well.  She prescribes armodafinil for chronic fatigue.  Kidney stones: July 2017 was noted to have 7mm R59mJ stone, s/p treatment. Denies any urinary complaints or hematuria.  Depression: She is frustrated/aggravated that "something" is going on all the time, medically.  Feels like she isn't dependable, everything falls onto her older sister (in caring for mother).  She has been getting counseling regularly. Previously under the care of Dr. McKinnCaprice Beavers to see the NP in the same practice (Dr. McKinnCaprice Beavernger there). Feels like her family would be better off without her.  Denies any SI or HI, would never act.      PHYSICAL EXAM:  Wt Readings from Last 3 Encounters:  07/10/17 227 lb (103 kg)  06/19/17 217 lb (98.4 kg)  05/23/17 226 lb 12.8 oz (102.9 kg)     c-met, lipids, TSH

## 2017-07-25 ENCOUNTER — Encounter: Payer: 59 | Admitting: Family Medicine

## 2017-07-30 NOTE — Progress Notes (Signed)
Chief Complaint  Patient presents with  . Hypertension    fasting med check. Wants to know if she should get Shingrix. [facility]   . Rash    has some places on her fingers and arms that began itching yesterday.     She canceled her visit last week due to vomiting.  She saw GI yesterday. Her Zantac 300 mg was increased to BID; continues on Protonix BID. Plan is to change to Dexilant if not seeing improvement. She has diagnosis of gastroparesis and reflux. She had endoscopy, manometry and pH probe in December. She reports had ongoing reflux (11% of the time) despite her current regimen. Hasn't vomited since last week.  Has some nausea sometime during the day.  She reports "last gastroparesis test was normal", normal emptying. She asked doctor about bariatric surgery, and said she might do well.  She was being referred to a bariatric clinic (with [place] satellite office) for consultation.  She had recent GYN exam with Dr. Phineas Real.  DEXA was done at hteir office last week. (results not visible to me; letter en route to pt).  She is reporting some itchy bumps on her fingers and right forearm.  Wondering if it is poison ivy, and if it is contagious to her grandchild.  These are itchy, not painful, with no other areas of rash. No mouth or foot pain.  Hypertension: (never formally diagnosed, because hadbeen on diuretics for fluid retention (stopped a while ago) and beta blockers for tremor--but high BP's noted when not taking these medications in past). She periodically checks her BP at home, running 119-120's/70's.She reports it was higher at the [facility] office yesterday. She denies side effects or muscle cramping. She hasn't been on diuretics for a while, and denies any edema.  Allergies: doing pretty well currently.Using Flonase and zyrtec every day.  Gout:  Allopurinol dose was increased from 268m to 3035min May, after uric acid level remained >6.  Due for recheck today. Denies  any gout flares.  Lab Results  Component Value Date   LABURIC 6.4 03/28/2017    Pre-diabetes: Occasionally drinks sweet tea. She tries to limit her sweets (admts that if she has them at the house, they "call her"), has "more than she should".  Admits to eating for comfort, when depressed. +potatoes, not much rice, pasta. She hans't been getting exercise recently.  Looking into water aerobics, [facility] Grandson was premature, doing much better now.  Lab Results  Component Value Date   HGBA1C 5.9 (H) 01/21/2017    OSA--on CPAP, tolerating well. She uses albuterol prn. She last saw pulm in July and had proAir refilled. They also discussed her CPAP use. Bariatric surgery was discussed.  Hyperlipidemia follow-up: Patient is trying to follow a low-fat, low cholesterol diet as best she can. Compliant with medications and denies medication side effects.  Lab Results  Component Value Date   CHOL 175 01/21/2017   HDL 60 01/21/2017   LDLCALC 92 01/21/2017   TRIG 113 01/21/2017   CHOLHDL 2.9 01/21/2017   Migraines:Takes topamax for prevention (also on pamelor). Previously took imitrex for migraines (stopped related to serotonin syndrome/hospitalization). Gets migraines less than once a month, relieved by OTC medication most of the time. Other headaches are a little more frequent (not migraines).  Tremor--on beta blocker. Sometimes it is bad (hard to send text message), but for the most part it is well controlled. Worse in the right hand.  Sometimes she notices whole body jerking while at rest, once recently at the hairdresser. Infrequent.  Neuropathy in her feet--Previously took gabapentin (333m in am, 605mqHS). She couldn't tell that it was helping, so it was stopped (over a month ago). It doesn't seem to be worse.  She last saw Dr. ToValaria Goodn June. He was concerned she may be having some side effects from nortriptyline dosing. As tolerated, may wish to  attempt nortriptyline taper, decreased from 4 to 3/day  RLS: This is controlled, not very often.  Denies it being worse since stopping gabapentin.   Fibromyalgia: treated by Dr. DeEstanislado PandyShe was last seen 12/26/16 and had left shoulder cortisone injection.  Herright knee replacement continues to do well.Currently not having a lot of muscular pain. She prescribes armodafinil (Nuvigil) for chronic fatigue.  Kidney stones: July 2017 was noted to have 60m59m UPJ stone, s/p treatment. Denies any urinary complaints or hematuria.  Depression:  Moods have been better, hasn't been getting counseling recently. Previously under the care of Dr. McKCaprice Beaverow sees the NP in the same practice (Dr. McKCaprice Beaver longer there). No medicaiton changes. Due to see her later this month.  This wasn't a great weekend, felt like "I couldn't do anything right" with people.   PMH, PSHLoveladyH reviewed   Outpatient Encounter Prescriptions as of 07/31/2017  Medication Sig Note  . acetaminophen (TYLENOL) 500 MG tablet Take 500-1,000 mg by mouth every 6 (six) hours as needed for mild pain, moderate pain, fever or headache. Reported on 01/02/2016   . albuterol (PROVENTIL HFA;VENTOLIN HFA) 108 (90 Base) MCG/ACT inhaler Inhale 1-2 puffs into the lungs every 6 (six) hours as needed for wheezing or shortness of breath. Reported on 05/30/2016   . allopurinol (ZYLOPRIM) 300 MG tablet TAKE 1 TABLET BY MOUTH EVERY DAY   . ALPRAZolam (XANAX) 0.25 MG tablet Take 0.25 mg by mouth 3 (three) times daily as needed for anxiety.    . ARIPiprazole (ABILIFY) 5 MG tablet Take 5 mg by mouth daily.   . Armodafinil (NUVIGIL) 250 MG tablet Take 250 mg by mouth daily with breakfast.   . atorvastatin (LIPITOR) 40 MG tablet TAKE 1 TABLET EVERY DAY   . cetirizine (ZYRTEC) 10 MG tablet Take 10 mg by mouth at bedtime.   . cholecalciferol (VITAMIN D) 1000 units tablet Take 1,000 Units by mouth daily.   . DULoxetine (CYMBALTA) 60 MG capsule Take 60 mg  by mouth 2 (two) times daily.    . fluticasone (FLONASE) 50 MCG/ACT nasal spray PLACE 2 SPRAYS INTO BOTH NOSTRILS DAILY.   . methocarbamol (ROBAXIN) 500 MG tablet TAKE 1 TABLET AT 7AM AND AT 2PM   . metoprolol tartrate (LOPRESSOR) 50 MG tablet Take 1 tablet (50 mg total) by mouth 2 (two) times daily.   . Multiple Vitamins-Minerals (PRESERVISION AREDS PO) Take 1 capsule by mouth 2 (two) times daily. Reported on 01/02/2016   . nortriptyline (PAMELOR) 25 MG capsule Take 75-100 mg by mouth at bedtime. 07/31/2017: Takes 3 (37m31mHS  . ondansetron (ZOFRAN) 8 MG tablet Take 8 mg by mouth every 8 (eight) hours as needed for nausea or vomiting. Reported on 05/09/2016   . pantoprazole (PROTONIX) 40 MG tablet Take 40 mg by mouth 2 (two) times daily.   . Probiotic Product (PHILLargoPS Take 1 capsule by mouth daily. Reported on 01/02/2016   . prochlorperazine (COMPAZINE) 10 MG tablet Take 10 mg by mouth every 6 (six) hours as needed for nausea  or vomiting. Reported on 05/09/2016   . ranitidine (ZANTAC) 300 MG tablet Take 300 mg by mouth 2 (two) times daily.    Marland Kitchen topiramate (TOPAMAX) 25 MG tablet TAKE 2 TABLETS AT BEDTIME   . [DISCONTINUED] ranitidine (ZANTAC) 300 MG tablet Take 300 mg by mouth.   . colchicine 0.6 MG tablet Take 2 tablets by mouth at onset of gout flare. Taken an additional tablet 1 hour later, if needed. May take like this every 3d, if needed (Patient not taking: Reported on 07/31/2017)   . gabapentin (NEURONTIN) 300 MG capsule Take 1 capsule (300 mg total) by mouth at bedtime. (Patient not taking: Reported on 07/31/2017)   . promethazine (PHENERGAN) 25 MG suppository Place 25 mg rectally every 6 (six) hours as needed for nausea or vomiting. Reported on 05/09/2016    No facility-administered encounter medications on file as of 07/31/2017.    Allergies  Allergen Reactions  . Erythromycin Nausea Only  . Tramadol Itching  . Dilaudid [Hydromorphone Hcl] Itching   ROS: no fever, chills, URI  symptoms, cough, shortness of breath, chest pain, bleeding, bruising.  +itchy bumps on hands, no other rash.  Some mild nausea, no vomiting since last week, no bowel changes. No urinary complaints.  See HPI for details   PHYSICAL EXAM:  BP 112/70 (BP Location: Right Arm, Patient Position: Sitting, Cuff Size: Normal)   Pulse 84   Temp 99.1 F (37.3 C) (Tympanic)   Ht '5\' 3"'  (1.6 m)   Wt 226 lb (102.5 kg)   LMP 07/27/2006   BMI 40.03 kg/m   Wt Readings from Last 3 Encounters:  07/31/17 226 lb (102.5 kg)  07/10/17 227 lb (103 kg)  06/19/17 217 lb (98.4 kg)   Well developed, pleasant, somewhat cushingoid appearance.  She appears well, in no distress, in good spirits HEENT: conjunctiva and sclera are clear, OP is clear, no sinus tenderness Neck: no lymphadenopathy, thyromegaly or carotid bruit Back: no spinal tenderness, CVA tenderness or significant trigger points today Heart: regular rate and rhythm Lungs: clear bilaterally Abdomen: obese.  Mild epigastric tenderness. No organomegaly or mass Extremities: no clubbing, cyanosis or edema.  2+ pulses.   Small papules scattered (one on a few fingers and right forearm); no pustules, erythema or crusting\ Neuro: alert and oriented, cranial nerves intact, normal gait. Psych: normal mood, affect, hygiene and grooming.  PHQ9 score 9 (mproved from last check)  Lab Results  Component Value Date   HGBA1C 6.3 07/31/2017    ASSESSMENT/PLAN:  Prediabetes - worsening; reviewed diet in detail; discussed portions, cutting back sugar/carbs, exercise, wt loss; declines metformin today - Plan: HgB A1c, CANCELED: Hemoglobin A1c  Medication monitoring encounter - Plan: Lipid panel, Comprehensive metabolic panel, Uric acid  Mixed hyperlipidemia - due for labs, return when fasting - Plan: Lipid panel, Comprehensive metabolic panel  IFG (impaired fasting glucose) - Plan: Comprehensive metabolic panel  OSA (obstructive sleep apnea) - continue  regular CPAP use.  Weight loss encouraged  Depression, major, single episode, in partial remission ([place]) - improved from last visit.  Continue f/u with psych as planned  Migraine without status migrainosus, not intractable, unspecified migraine type  Fibromyalgia  Idiopathic peripheral neuropathy  Acute gout involving toe of right foot, unspecified cause - no flares; recheck uric acid since allopurinol dose was increased - Plan: Uric acid  Gastroesophageal reflux disease, esophagitis presence not specified - inadequately controlled; dose adjustments made yesterday by GI  Gastroparesis  Rash of hands - appear to be due  to insect bite by history and exam. Not contagious; HC topical prn  c-met, lipids, uric acid--return fasting.   F/u 3 months Repeat A1c and consider metformin.  Hopefully had weight loss/seeing bariatric clinic and improved.  Had flu shot from pharmacy today. Shingrix recommended, when available (to check with insurance and can get here when available in 2019).  Discussed [facility] and how their bariatric surgical process works, vs other bariatric clinics.  Okay to see how things go with clinic she was referred to by her GI.  Counseled re: weight loss, exercise, risks, surgical treatment options if not successful with other options.  30-35 min visit, more than 1/2 spent counseling.

## 2017-07-31 ENCOUNTER — Ambulatory Visit (INDEPENDENT_AMBULATORY_CARE_PROVIDER_SITE_OTHER): Payer: 59 | Admitting: Family Medicine

## 2017-07-31 ENCOUNTER — Encounter: Payer: Self-pay | Admitting: Family Medicine

## 2017-07-31 VITALS — BP 112/70 | HR 84 | Temp 99.1°F | Ht 63.0 in | Wt 226.0 lb

## 2017-07-31 DIAGNOSIS — R21 Rash and other nonspecific skin eruption: Secondary | ICD-10-CM

## 2017-07-31 DIAGNOSIS — K219 Gastro-esophageal reflux disease without esophagitis: Secondary | ICD-10-CM

## 2017-07-31 DIAGNOSIS — G609 Hereditary and idiopathic neuropathy, unspecified: Secondary | ICD-10-CM | POA: Diagnosis not present

## 2017-07-31 DIAGNOSIS — M109 Gout, unspecified: Secondary | ICD-10-CM | POA: Diagnosis not present

## 2017-07-31 DIAGNOSIS — F324 Major depressive disorder, single episode, in partial remission: Secondary | ICD-10-CM

## 2017-07-31 DIAGNOSIS — M797 Fibromyalgia: Secondary | ICD-10-CM | POA: Diagnosis not present

## 2017-07-31 DIAGNOSIS — R7301 Impaired fasting glucose: Secondary | ICD-10-CM | POA: Diagnosis not present

## 2017-07-31 DIAGNOSIS — K3184 Gastroparesis: Secondary | ICD-10-CM | POA: Diagnosis not present

## 2017-07-31 DIAGNOSIS — R7303 Prediabetes: Secondary | ICD-10-CM | POA: Diagnosis not present

## 2017-07-31 DIAGNOSIS — E782 Mixed hyperlipidemia: Secondary | ICD-10-CM

## 2017-07-31 DIAGNOSIS — G4733 Obstructive sleep apnea (adult) (pediatric): Secondary | ICD-10-CM

## 2017-07-31 DIAGNOSIS — Z5181 Encounter for therapeutic drug level monitoring: Secondary | ICD-10-CM

## 2017-07-31 DIAGNOSIS — G43909 Migraine, unspecified, not intractable, without status migrainosus: Secondary | ICD-10-CM

## 2017-07-31 LAB — POCT GLYCOSYLATED HEMOGLOBIN (HGB A1C): Hemoglobin A1C: 6.3

## 2017-07-31 NOTE — Patient Instructions (Addendum)
Continue your medications as prescribed by your other doctors. Continue the atorvastatin, and return for fasting labs within the week.  I agree with recommendation for bariatric referral.  Your pre-diabetes has progressed closer to diabetes.  Please try and work on cutting back sugar in the diet (ie sweet tea, sweets/junk, limiting portions of carbs).  Please work on losing weight.  Daily exercise is recommended.  Return in 3 months. If A1c remains high, we should consider starting metformin to treat pre-diabetes/diabetes.  Hopefully, if seeing bariatric clinic, this should be better.  I recommend getting the new shingles vaccine (Shingrix). You will need to check with your insurance to see if it is covered, and if covered by Medicare Part D, you need to get from the pharmacy rather than our office.  It is a series of 2 injections, spaced 2 months apart. There is currently a shortage.  Please check back in January/February (if it is covered by your insurance).  You may use a hydrocortisone cream (ie Cortaid) as needed for itching to the bumps on your hands/arms.

## 2017-08-07 ENCOUNTER — Telehealth: Payer: Self-pay | Admitting: Rheumatology

## 2017-08-07 NOTE — Telephone Encounter (Signed)
Last Rx 04/2016 90 d with a refill  Last visit 02/21/2017  Next visit 09/04/2017  Ok to refill Nuvigil ?

## 2017-08-07 NOTE — Telephone Encounter (Signed)
Patient request refill on Nuvigil 250mg . [facility] [address].

## 2017-08-07 NOTE — Telephone Encounter (Signed)
ok 

## 2017-08-08 ENCOUNTER — Telehealth: Payer: Self-pay

## 2017-08-08 MED ORDER — ARMODAFINIL 250 MG PO TABS
250.0000 mg | ORAL_TABLET | Freq: Every day | ORAL | 0 refills | Status: DC
Start: 2017-08-08 — End: 2017-11-14

## 2017-08-08 NOTE — Telephone Encounter (Signed)
Received a prior authorization request through cover my meds for generic nuvagil. Authorization was processed and received a favorable outcome. Patient is approve from 07/09/17 through 08/08/18  Reference number: [id number]  Will send document to scan center.   Called patient to update her. Patient voiced understanding and denies any questions at this time.   Jasai Sorg, [place], CPhT 2:45 PM

## 2017-08-08 NOTE — Addendum Note (Signed)
Addended by: Carole Binning on: 08/08/2017 10:07 AM   Modules accepted: Orders

## 2017-08-10 ENCOUNTER — Other Ambulatory Visit: Payer: Self-pay | Admitting: Family Medicine

## 2017-08-12 NOTE — Telephone Encounter (Signed)
Are we refilling this medication for her now? See note from 07/08/17-I could not see if it was addressed at recent med check (not sure if I missed it?)

## 2017-08-23 ENCOUNTER — Ambulatory Visit: Payer: 59 | Admitting: Rheumatology

## 2017-08-27 NOTE — Progress Notes (Signed)
Office Visit Note  Patient: [name]             Date of Birth: [birth year]           MRN: [id number]             PCP: Rita Ohara, MD Referring: Rita Ohara, MD Visit Date: 09/04/2017 Occupation: @GUAROCC @    Subjective:  Fibromyalgia (Daily pain), knee pain.   History of Present Illness: [name] is a 61 y.o. female with history of fibromyalgia, osteoarthritis and disc disease. She states she's been experiencing increased pain and discomfort in her bilateral knee joints. Her right knee joint has been replaced which is causing a lot of discomfort. She was seen by her orthopedic doctor who did x-rays of bilateral knee joints. There observing it for right now. She continues to have some generalized pain from fibromyalgia. She continues to have neck and upper and lower back.  Activities of Daily Living:  Patient reports morning stiffness for 1 hour.   Patient Reports nocturnal pain.  Difficulty dressing/grooming: Denies Difficulty climbing stairs: Reports Difficulty getting out of chair: Reports Difficulty using hands for taps, buttons, cutlery, and/or writing: Denies   Review of Systems  Constitutional: Positive for fatigue. Negative for night sweats, weight gain, weight loss and weakness.  HENT: Positive for mouth dryness. Negative for mouth sores, trouble swallowing, trouble swallowing and nose dryness.   Eyes: Positive for itching and dryness. Negative for pain, redness and visual disturbance.  Respiratory: Negative for cough, shortness of breath and difficulty breathing.   Cardiovascular: Negative for chest pain, palpitations, hypertension, irregular heartbeat and swelling in legs/feet.  Gastrointestinal: Positive for nausea. Negative for blood in stool, constipation, diarrhea and heartburn.  Endocrine: Negative for heat intolerance, excessive thirst and increased urination.  Genitourinary: Negative for painful urination and vaginal dryness.  Musculoskeletal: Positive  for arthralgias, gait problem, joint pain, morning stiffness and muscle tenderness. Negative for joint swelling, myalgias, muscle weakness and myalgias.  Skin: Positive for hair loss. Negative for color change, rash, skin tightness, ulcers and sensitivity to sunlight.  Allergic/Immunologic: Negative for susceptible to infections.  Neurological: Negative for dizziness, light-headedness, numbness, memory loss and night sweats.  Hematological: Negative for bruising/bleeding tendency and swollen glands.  Psychiatric/Behavioral: Positive for sleep disturbance. Negative for depressed mood. The patient is not nervous/anxious.     PMFS History:  Patient Active Problem List   Diagnosis Date Noted  . DJD (degenerative joint disease), cervical 12/24/2016  . Primary osteoarthritis of both hips 12/24/2016  . Primary osteoarthritis of both knees 12/24/2016  . H/O total knee replacement, right 12/24/2016  . Spondylosis of lumbar region without myelopathy or radiculopathy 12/24/2016  . Acute gout 05/30/2016  . Obstructive apnea 05/10/2016  . Myalgia 03/29/2016  . Other long term (current) drug therapy 03/29/2016  . Idiopathic peripheral neuropathy 03/29/2016  . Cannot sleep 03/29/2016  . Migraine without aura and responsive to treatment 03/29/2016  . Multifocal myoclonus 03/29/2016  . Restless leg 03/29/2016  . Has a tremor 03/29/2016  . Dyspnea 01/25/2016  . History of aspiration pneumonitis 01/25/2016  . History of acute bronchitis 01/25/2016  . LPRD (laryngopharyngeal reflux disease) 01/25/2016  . Imbalance 01/09/2016  . Serotonin syndrome 12/22/2015  . OA (osteoarthritis) of knee 09/05/2015  . Obesity, Class II, BMI 35-39.9, with comorbidity ([place]) 05/17/2015  . OSA (obstructive sleep apnea) 03/16/2013  . Iron deficiency 11/06/2012  . Thrombocythemia ([place]) 11/06/2012  . Elevated WBC count 11/05/2012  . Impaired fasting glucose  07/23/2012  . Bronchitis 04/03/2012  . Kidney stone on left  side 03/05/2012  . Kidney cysts 03/04/2012  . Loose stools 03/04/2012  . Urinary frequency 12/04/2011  . Restless leg syndrome 12/04/2011  . Polypharmacy 12/04/2011  . Bladder incontinence 12/04/2011  . Fibromyalgia   . Hyperlipidemia   . Edema   . S/P endometrial ablation   . Allergic rhinitis 09/18/2011  . Pneumonia, aspiration ([place]) 05/27/2011  . Depression, major, single episode, in partial remission ([place]) 04/21/2011  . OVERWEIGHT 02/17/2010  . PRECORDIAL PAIN 02/17/2010  . Anxiety state 01/30/2010  . Migraine 01/30/2010  . GERD 01/30/2010  . Gastroparesis 01/30/2010    Past Medical History:  Diagnosis Date  . Anemia    previously followed by Dr. Jamse Arn for anemia and elevated platelets  . Anxiety   . C. difficile colitis 10/01/12   treated by [facility]  . Chronic fatigue syndrome   . DDD (degenerative disc disease), lumbar 08/19/14   and facet arthroplasty & left lumbar radiculopathy (Dr.Ramos)  . Depression   . Dyssynergia    dyssynergenic defecation, contributing to fecal incontinence.  . Edema   . Fibromyalgia   . Gastroparesis    followed at [facility]  . GERD (gastroesophageal reflux disease)   . Hyperlipidemia   . Kidney stone   . Lumbar radiculopathy   . Migraine   . Neuropathy   . Obstructive sleep apnea    On Cpap 2009  . Paresthesia    Dr. Everette Rank at [facility]  . Pelvic floor dysfunction    pelvic floor dyssynergy  . Plantar fasciitis 02/2011   R foot  . Pneumonia    2012  . PONV (postoperative nausea and vomiting)    pt states has gastroparesis has difficulty taking antibiotics and narcotics has severe nausea and vomiting   . Restless leg syndrome   . S/P endometrial ablation 08/09/2006   Novasure Ablation  . S/P epidural steroid injection 09/20/14   Dr.Ramos  . Shortness of breath dyspnea    bending over; exertion   . Tremor    Dr. Everette Rank  . Urinary frequency   . Urinary incontinence     Family History  Problem Relation Age of Onset   . Allergies Mother   . Hypertension Mother   . Heart disease Mother        possible valve problem  . Macular degeneration Mother   . Heart disease Father        pacemaker, CHF  . Hypertension Father   . Diabetes Father        borderline  . Stroke Father 51  . Kidney disease Father   . Asthma Sister   . Irritable bowel syndrome Sister   . Allergies Sister   . Heart disease Paternal Grandmother   . Heart disease Paternal Grandfather   . Cancer Maternal Aunt        leukemia  . Cancer Maternal Aunt   . Colon cancer Maternal Aunt        late 60's   Past Surgical History:  Procedure Laterality Date  . CHOLECYSTECTOMY  9/05  . ENDOMETRIAL ABLATION  08/09/2006   Dr. Valentina Shaggy Ablation  . FACET JOINT INJECTION  04/17/2017   Left L4-5 and L5-S1  . KNEE ARTHROPLASTY    . KNEE SURGERY  1999   R knee, Dr. Eddie Dibbles, torn cartilage  . TONSILLECTOMY  1968  . TONSILLECTOMY    . TOTAL KNEE ARTHROPLASTY Right 09/05/2015   Procedure: RIGHT TOTAL KNEE  ARTHROPLASTY;  Surgeon: Gaynelle Arabian, MD;  Location: [facility];  Service: Orthopedics;  Laterality: Right;   Social History   Social History Narrative   Married, 1 dog. 1 son in [place] (expecting first child end 06/2017), 1 stepson in [place], with 2 children     Objective: Vital Signs: BP 113/69 (BP Location: Left Arm, Patient Position: Sitting, Cuff Size: Normal)   Pulse 79   Resp 17   Ht 5\' 2"  (1.575 m)   Wt 224 lb (101.6 kg)   LMP 07/27/2006   BMI 40.97 kg/m    Physical Exam  Constitutional: She is oriented to person, place, and time. She appears well-developed and well-nourished.  HENT:  Head: Normocephalic and atraumatic.  Eyes: Conjunctivae and EOM are normal.  Neck: Normal range of motion.  Cardiovascular: Normal rate, regular rhythm, normal heart sounds and intact distal pulses.   Pulmonary/Chest: Effort normal and breath sounds normal.  Abdominal: Soft. Bowel sounds are normal.  Lymphadenopathy:    She has no  cervical adenopathy.  Neurological: She is alert and oriented to person, place, and time.  Skin: Skin is warm and dry. Capillary refill takes less than 2 seconds.  Psychiatric: She has a normal mood and affect. Her behavior is normal.  Nursing note and vitals reviewed.    Musculoskeletal Exam: C-spine and thoracic lumbar spine good range of motion. She is spasm in the right trapezius area. Shoulder joints elbow joints wrist joints with good range of motion. She is no synovitis over MCPs or PIP joints. Her right total knee replacement has some crepitus and some warmth. Left knee joint is good range of motion with some discomfort. She is some osteoarthritic changes in her feet. Fibromyalgia tender points were 18 out of 18 positive she generalized hyperalgesia.  CDAI Exam: No CDAI exam completed.    Investigation: No additional findings. CBC Latest Ref Rng & Units 05/22/2017 01/21/2017 12/24/2015  WBC 4.0 - 10.5 K/uL 11.0(H) 9.8 9.5  Hemoglobin 12.0 - 15.0 g/dL 12.0 13.7 11.6(L)  Hematocrit 36.0 - 46.0 % 38.5 42.1 36.3  Platelets 150 - 400 K/uL 355 454(H) 356   CMP Latest Ref Rng & Units 05/22/2017 01/21/2017 05/30/2016  Glucose 65 - 99 mg/dL 104(H) 113(H) 82  BUN 6 - 20 mg/dL 16 20 17   Creatinine 0.44 - 1.00 mg/dL 0.91 1.02 0.79  Sodium 135 - 145 mmol/L 141 141 138  Potassium 3.5 - 5.1 mmol/L 3.9 4.2 4.3  Chloride 101 - 111 mmol/L 107 106 104  CO2 22 - 32 mmol/L 25 26 22   Calcium 8.9 - 10.3 mg/dL 8.8(L) 9.5 9.0  Total Protein 6.5 - 8.1 g/dL 6.6 7.2 6.5  Total Bilirubin 0.3 - 1.2 mg/dL 0.4 0.4 0.3  Alkaline Phos 38 - 126 U/L 96 94 112  AST 15 - 41 U/L 22 19 16   ALT 14 - 54 U/L 24 18 17     Imaging: No results found.  Speciality Comments: No specialty comments available.    Procedures:  Trigger Point Inj Date/Time: 09/04/2017 9:25 AM Performed by: Bo Merino Authorized by: Bo Merino   Consent Given by:  Patient Site marked: the procedure site was marked     Timeout: prior to procedure the correct patient, procedure, and site was verified   Indications:  Muscle spasm and pain Total # of Trigger Points:  1 Location: neck   Needle Size:  27 G Approach:  Dorsal Medications #1:  0.5 mL lidocaine 1 %; 10 mg triamcinolone acetonide 40 MG/ML  Patient tolerance:  Patient tolerated the procedure well with no immediate complications   Allergies: Erythromycin; Tramadol; and Dilaudid [hydromorphone hcl]   Assessment / Plan:     Visit Diagnoses: Fibromyalgia: She is some generalized pain and discomfort and positive tender points. She also has hyperalgesia.  Neck pain: She's been having right trapezius is spasm which is causing a lot of discomfort. After informed consent was obtained the right trapezius area was injected as described above which she tolerated well. She is on Robaxin which she's been taking on when necessary basis.  DDD (degenerative disc disease), cervical: Chronic pain  DDD (degenerative disc disease), lumbar: Chronic pain  Primary osteoarthritis of both hips: She does have some discomfort with range of motion  H/O total knee replacement, right: She has seen Dr. Maureen Ralphs and will follow-up with him.  Primary osteoarthritis of left knee she continues to have discomfort. Weight loss diet and exercise was discussed.  Her other medical problems are listed as follows:  Thrombocythemia ([place])  Other elevated white blood cell (WBC) count  History of anemia  History of sleep apnea  History of migraine: Controlled with Topamax.  History of depression    Orders: Orders Placed This Encounter  Procedures  . Trigger Point Injection   No orders of the defined types were placed in this encounter.     Follow-Up Instructions: Return in about 6 months (around 03/05/2018) for FMS OA DDD.   Bo Merino, MD  Note - This record has been created using Editor, commissioning.  Chart creation errors have been sought, but may not always   have been located. Such creation errors do not reflect on  the standard of medical care.

## 2017-09-04 ENCOUNTER — Encounter: Payer: Self-pay | Admitting: Rheumatology

## 2017-09-04 ENCOUNTER — Telehealth: Payer: Self-pay | Admitting: *Deleted

## 2017-09-04 ENCOUNTER — Ambulatory Visit (INDEPENDENT_AMBULATORY_CARE_PROVIDER_SITE_OTHER): Payer: 59 | Admitting: Rheumatology

## 2017-09-04 VITALS — BP 113/69 | HR 79 | Resp 17 | Ht 62.0 in | Wt 224.0 lb

## 2017-09-04 DIAGNOSIS — M5030 Other cervical disc degeneration, unspecified cervical region: Secondary | ICD-10-CM | POA: Diagnosis not present

## 2017-09-04 DIAGNOSIS — M160 Bilateral primary osteoarthritis of hip: Secondary | ICD-10-CM

## 2017-09-04 DIAGNOSIS — Z8659 Personal history of other mental and behavioral disorders: Secondary | ICD-10-CM

## 2017-09-04 DIAGNOSIS — D473 Essential (hemorrhagic) thrombocythemia: Secondary | ICD-10-CM

## 2017-09-04 DIAGNOSIS — Z96651 Presence of right artificial knee joint: Secondary | ICD-10-CM

## 2017-09-04 DIAGNOSIS — M5136 Other intervertebral disc degeneration, lumbar region: Secondary | ICD-10-CM

## 2017-09-04 DIAGNOSIS — M542 Cervicalgia: Secondary | ICD-10-CM | POA: Diagnosis not present

## 2017-09-04 DIAGNOSIS — D75839 Thrombocytosis, unspecified: Secondary | ICD-10-CM

## 2017-09-04 DIAGNOSIS — M797 Fibromyalgia: Secondary | ICD-10-CM | POA: Diagnosis not present

## 2017-09-04 DIAGNOSIS — Z862 Personal history of diseases of the blood and blood-forming organs and certain disorders involving the immune mechanism: Secondary | ICD-10-CM

## 2017-09-04 DIAGNOSIS — Z8669 Personal history of other diseases of the nervous system and sense organs: Secondary | ICD-10-CM

## 2017-09-04 DIAGNOSIS — D72828 Other elevated white blood cell count: Secondary | ICD-10-CM

## 2017-09-04 DIAGNOSIS — M1712 Unilateral primary osteoarthritis, left knee: Secondary | ICD-10-CM

## 2017-09-04 MED ORDER — TRIAMCINOLONE ACETONIDE 40 MG/ML IJ SUSP
10.0000 mg | INTRAMUSCULAR | Status: AC | PRN
  Administered 2017-09-04: 10 mg via INTRAMUSCULAR

## 2017-09-04 MED ORDER — LIDOCAINE HCL 1 % IJ SOLN
0.5000 mL | INTRAMUSCULAR | Status: AC | PRN
  Administered 2017-09-04: .5 mL

## 2017-09-04 NOTE — Telephone Encounter (Signed)
Refill request for Gabapentin 300mg  #270 one tablet tid. Dr. Milinda Pointer prescribed 05/17/2016 to be taken at bedtime. Return fax denying due to not taking as Dr. Milinda Pointer prescribed and pt needs an appt.

## 2017-09-30 ENCOUNTER — Other Ambulatory Visit: Payer: Self-pay | Admitting: *Deleted

## 2017-09-30 ENCOUNTER — Telehealth: Payer: Self-pay | Admitting: Internal Medicine

## 2017-09-30 MED ORDER — ALLOPURINOL 300 MG PO TABS: 300.0000 mg | ORAL_TABLET | Freq: Every day | ORAL | 0 refills | Status: DC

## 2017-09-30 NOTE — Telephone Encounter (Signed)
She was supposed to return for those fasting labs in September, and never did.  Have her come now for those labs. Okay to refill med if needed prior to her lab visit (I don't want her to run out of med before labs)

## 2017-09-30 NOTE — Telephone Encounter (Signed)
Sent in rx-and set her up for appt this Thurs for labs.

## 2017-09-30 NOTE — Telephone Encounter (Signed)
Refill request for allopurinol 300mg  #90 to [facility]. Looks like pt did not come back for fasting labs when you put orders in. Pt has a med check appt on December 6th. Does she need to come in now for labs before refill or come in for labs before visit.

## 2017-10-03 ENCOUNTER — Other Ambulatory Visit: Payer: 59

## 2017-10-03 DIAGNOSIS — E782 Mixed hyperlipidemia: Secondary | ICD-10-CM

## 2017-10-03 DIAGNOSIS — M109 Gout, unspecified: Secondary | ICD-10-CM

## 2017-10-03 DIAGNOSIS — R7301 Impaired fasting glucose: Secondary | ICD-10-CM

## 2017-10-03 DIAGNOSIS — Z5181 Encounter for therapeutic drug level monitoring: Secondary | ICD-10-CM

## 2017-10-03 LAB — COMPREHENSIVE METABOLIC PANEL
AG Ratio: 1.3 (calc) (ref 1.0–2.5)
ALT: 21 U/L (ref 6–29)
AST: 19 U/L (ref 10–35)
Albumin: 3.5 g/dL — ABNORMAL LOW (ref 3.6–5.1)
Alkaline phosphatase (APISO): 109 U/L (ref 33–130)
BUN: 21 mg/dL (ref 7–25)
CO2: 24 mmol/L (ref 20–32)
Calcium: 8.5 mg/dL — ABNORMAL LOW (ref 8.6–10.4)
Chloride: 108 mmol/L (ref 98–110)
Creat: 0.74 mg/dL (ref 0.50–0.99)
Globulin: 2.7 g/dL (calc) (ref 1.9–3.7)
Glucose, Bld: 109 mg/dL — ABNORMAL HIGH (ref 65–99)
Potassium: 4 mmol/L (ref 3.5–5.3)
Sodium: 141 mmol/L (ref 135–146)
Total Bilirubin: 0.3 mg/dL (ref 0.2–1.2)
Total Protein: 6.2 g/dL (ref 6.1–8.1)

## 2017-10-03 LAB — URIC ACID: Uric Acid, Serum: 4.6 mg/dL (ref 2.5–7.0)

## 2017-10-03 LAB — LIPID PANEL
Cholesterol: 133 mg/dL (ref <200)
HDL: 49 mg/dL — ABNORMAL LOW (ref >50)
LDL Cholesterol (Calc): 68 mg/dL (calc)
Non-HDL Cholesterol (Calc): 84 mg/dL (calc) (ref <130)
Total CHOL/HDL Ratio: 2.7 (calc) (ref <5.0)
Triglycerides: 84 mg/dL (ref <150)

## 2017-10-07 ENCOUNTER — Other Ambulatory Visit: Payer: Self-pay | Admitting: Family Medicine

## 2017-10-15 ENCOUNTER — Other Ambulatory Visit: Payer: Self-pay | Admitting: *Deleted

## 2017-10-15 MED ORDER — TOPIRAMATE 25 MG PO TABS: 50.0000 mg | ORAL_TABLET | Freq: Every day | ORAL | 0 refills | Status: DC

## 2017-10-15 NOTE — Telephone Encounter (Signed)
Refill request received via fax  Last Visit: 09/04/17 Next Visit: 03/13/18  Okay to refill per Dr. Estanislado Pandy

## 2017-10-24 ENCOUNTER — Other Ambulatory Visit: Payer: Self-pay | Admitting: Rheumatology

## 2017-10-24 NOTE — Telephone Encounter (Signed)
Last Visit: 09/04/17 Next Visit: 04/09/18   Okay to refill per Dr. Estanislado Pandy

## 2017-10-30 NOTE — Progress Notes (Addendum)
Chief Complaint  Patient presents with  . Hyperlipidemia    nonfasting med check. No concerns.     Patient presents for 3 month follow-up. She was noted to have worsening sugars/pre-diabetes at her visit in September. She had declined starting metformin at that time. She preferred to f/u in 3 months instead, since she had been referred to bariatric clinic. Fasting sugar was noted to be 109 on November labs.  At her last visit, she reported that she occasionally drinks sweet tea. She tries to limit her sweets (admts that if she has them at the house, they "call her"), has "more than she should".  Admits to eating for comfort, when depressed. +potatoes, not much rice, pasta. She hadn't been getting exercise recently.  Looking [facility] water aerobics[facility] Lab Results  Component Value Date   HGBA1C 6.3 07/31/2017   Update: She went to the bariatric class that was recommended.  The program involved 6 months of shakes, and cost $2700 (plus the cost of shakes), going 2x/week, including groups.  She cannot afford this. She has f/u with [facility] next week.  Will discuss surgical options, whether or not she would be a candidate.  She has been helping with her 90 month old grandson, so hasn't been able to get to exercise class (which are MWF at 15).  She reports being frustrated, feeling like she should be able to lose the weight on her own, is failing, became tearful.  Hyperlipidemia follow-up: Patient is trying to follow a low-fat, low cholesterol diet as best she can. Compliant with medications and denies medication side effects. Labs were repeated in November, and improved.  She also had uric acid level done, showing she is at goal on current dose of allopurinol. Lab Results  Component Value Date   CHOL 133 10/03/2017   HDL 49 (L) 10/03/2017   LDLCALC 92 01/21/2017   TRIG 84 10/03/2017   CHOLHDL 2.7 10/03/2017   Lab Results  Component Value Date   LABURIC 4.6  10/03/2017     Chemistry      Component Value Date/Time   NA 141 10/03/2017 1020   K 4.0 10/03/2017 1020   CL 108 10/03/2017 1020   CO2 24 10/03/2017 1020   BUN 21 10/03/2017 1020   CREATININE 0.74 10/03/2017 1020      Component Value Date/Time   CALCIUM 8.5 (L) 10/03/2017 1020   ALKPHOS 96 05/22/2017 0148   AST 19 10/03/2017 1020   ALT 21 10/03/2017 1020   BILITOT 0.3 10/03/2017 1020      PMH, PSH, SH reviewed  Outpatient Encounter Medications as of 10/31/2017  Medication Sig Note  . allopurinol (ZYLOPRIM) 300 MG tablet Take 1 tablet (300 mg total) daily by mouth.   . ALPRAZolam (XANAX) 0.25 MG tablet Take 0.25 mg by mouth 3 (three) times daily as needed for anxiety.    . ARIPiprazole (ABILIFY) 5 MG tablet Take 5 mg by mouth daily.   . Armodafinil (NUVIGIL) 250 MG tablet Take 1 tablet (250 mg total) by mouth daily with breakfast.   . atorvastatin (LIPITOR) 40 MG tablet TAKE 1 TABLET EVERY DAY   . cetirizine (ZYRTEC) 10 MG tablet Take 10 mg by mouth at bedtime.   . cholecalciferol (VITAMIN D) 1000 units tablet Take 1,000 Units by mouth daily.   . DULoxetine (CYMBALTA) 60 MG capsule Take 60 mg by mouth 2 (two) times daily.    . fluticasone (FLONASE) 50 MCG/ACT nasal  spray PLACE 2 SPRAYS INTO BOTH NOSTRILS DAILY.   Marland Kitchen gabapentin (NEURONTIN) 300 MG capsule Take 1 capsule (300 mg total) by mouth at bedtime.   . methocarbamol (ROBAXIN) 500 MG tablet TAKE 1 TABLET AT 7AM AND AT 2PM   . metoprolol tartrate (LOPRESSOR) 50 MG tablet TAKE 1 TABLET BY MOUTH TWICE A DAY   . Multiple Vitamins-Minerals (PRESERVISION AREDS PO) Take 1 capsule by mouth 2 (two) times daily. Reported on 01/02/2016   . nortriptyline (PAMELOR) 25 MG capsule Take 75-100 mg by mouth at bedtime. 07/31/2017: Takes 3 (75mg ) qHS  . pantoprazole (PROTONIX) 40 MG tablet Take 40 mg by mouth 2 (two) times daily.   . Probiotic Product ([place]) CAPS Take 1 capsule by mouth daily. Reported on 01/02/2016   .  prochlorperazine (COMPAZINE) 10 MG tablet Take 10 mg by mouth every 6 (six) hours as needed for nausea or vomiting. Reported on 05/09/2016   . ranitidine (ZANTAC) 300 MG tablet Take 300 mg by mouth 2 (two) times daily.    Marland Kitchen topiramate (TOPAMAX) 25 MG tablet Take 2 tablets (50 mg total) by mouth at bedtime.   Marland Kitchen acetaminophen (TYLENOL) 500 MG tablet Take 500-1,000 mg by mouth every 6 (six) hours as needed for mild pain, moderate pain, fever or headache. Reported on 01/02/2016   . albuterol (PROVENTIL HFA;VENTOLIN HFA) 108 (90 Base) MCG/ACT inhaler Inhale 1-2 puffs into the lungs every 6 (six) hours as needed for wheezing or shortness of breath. Reported on 05/30/2016 (Patient not taking: Reported on 10/31/2017)   . colchicine 0.6 MG tablet Take 2 tablets by mouth at onset of gout flare. Taken an additional tablet 1 hour later, if needed. May take like this every 3d, if needed (Patient not taking: Reported on 10/31/2017)   . ondansetron (ZOFRAN) 8 MG tablet Take 8 mg by mouth every 8 (eight) hours as needed for nausea or vomiting. Reported on 05/09/2016   . promethazine (PHENERGAN) 25 MG suppository Place 25 mg rectally every 6 (six) hours as needed for nausea or vomiting. Reported on 05/09/2016    No facility-administered encounter medications on file as of 10/31/2017.    Allergies  Allergen Reactions  . Erythromycin Nausea Only  . Tramadol Itching  . Dilaudid [Hydromorphone Hcl] Itching   ROS:  No fever, chills, URI symptoms. No weight changes, dizziness, headache, chest pain, edema, rashes, bleeding, bruising. +depression. GI sx unchanged.  PHYSICAL EXAM:  BP 114/70   Pulse 84   Ht 5\' 2"  (1.575 m)   Wt 225 lb 3.2 oz (102.2 kg)   LMP 07/27/2006   BMI 41.19 kg/m   Wt Readings from Last 3 Encounters:  10/31/17 225 lb 3.2 oz (102.2 kg)  09/04/17 224 lb (101.6 kg)  07/31/17 226 lb (102.5 kg)   Well appearing, obese female, in good spirits, but became tearful during visit in her frustration/feeling  like a failure with respect to weight loss. Normal hygiene, grooming, gait.   Lab Results  Component Value Date   HGBA1C 6.2 10/31/2017     ASSESSMENT/PLAN:  Pre-diabetes - stable - Plan: HgB A1c  Obesity, morbid, BMI 40.0-49.9 ([place]) - unchanged; counseled re: treatment options, diet, exercise, limitations. Discuss further with her GI  Fibromyalgia - encouraged her to restart her exercise/water class  Mixed hyperlipidemia - improved per November labs. Continue current regimen    Counseled extensively re: diet, exercise, triggers for eating, keeping healthy food around, her limitations compared to others with food choices. Discussed not skipping  meals, risks of obesity.  To discuss medication and surgical options with her other doctors, since she cannot do this on her own (and praised for her attempts, try to recognize the need for additional help to achieve success, rather than continuing to feel like a failture).  25 min visit, more than 1/2 spent counseling.  F/u as scheduled in May  [facility] Communicate with your daughter-in-law to see if her appointments could be adjusted to make you free for those classes. Please talk to your gastroenterologist next week about whether or not bariatric surgery is an option, and which would be preferred.  I think you need to look into this. We briefly discussed Contrave and Saxenda (the Kirke Shaggy is the injectable that I'm afraid may make your nausea and vomiting worse).  The Contrave contains wellbutrin, and I would want you to discuss this with your psychiatrist before considering this medication.  Discuss these with your gastro next week, as possible treatment options for weight loss, other than the surgery we discussed.  Your prediabetes is stable.  Work on the daily exercise, water classes and weight loss as we discussed.  We can continue to hold off on metformin. Your  cholesterol and other labs from November were good--continue on your current medications.

## 2017-10-31 ENCOUNTER — Ambulatory Visit: Payer: 59 | Admitting: Family Medicine

## 2017-10-31 ENCOUNTER — Encounter: Payer: Self-pay | Admitting: Family Medicine

## 2017-10-31 VITALS — BP 114/70 | HR 84 | Ht 62.0 in | Wt 225.2 lb

## 2017-10-31 DIAGNOSIS — M797 Fibromyalgia: Secondary | ICD-10-CM

## 2017-10-31 DIAGNOSIS — E782 Mixed hyperlipidemia: Secondary | ICD-10-CM | POA: Diagnosis not present

## 2017-10-31 DIAGNOSIS — R7303 Prediabetes: Secondary | ICD-10-CM

## 2017-10-31 LAB — POCT GLYCOSYLATED HEMOGLOBIN (HGB A1C): Hemoglobin A1C: 6.2

## 2017-10-31 NOTE — Patient Instructions (Signed)
  [facility] and [facility] Communicate with your daughter-in-law to see if her appointments could be adjusted to make you free for those classes. Please talk to your gastroenterologist next week about whether or not bariatric surgery is an option, and which would be preferred.  I think you need to look into this. We briefly discussed Contrave and Saxenda (the Kirke Shaggy is the injectable that I'm afraid may make your nausea and vomiting worse).  The Contrave contains wellbutrin, and I would want you to discuss this with your psychiatrist before considering this medication.  Discuss these with your gastro next week, as possible treatment options for weight loss, other than the surgery we discussed.  Your prediabetes is stable.  Work on the daily exercise, water classes and weight loss as we discussed.  We can continue to hold off on metformin. Your cholesterol and other labs from November were good--continue on your current medications.

## 2017-10-31 NOTE — Addendum Note (Signed)
Addended by: Rita Ohara on: 10/31/2017 01:30 PM   Modules accepted: Level of Service

## 2017-11-07 ENCOUNTER — Telehealth: Payer: Self-pay | Admitting: Family Medicine

## 2017-11-07 DIAGNOSIS — E785 Hyperlipidemia, unspecified: Secondary | ICD-10-CM

## 2017-11-07 MED ORDER — ATORVASTATIN CALCIUM 40 MG PO TABS: 40.0000 mg | ORAL_TABLET | Freq: Every day | ORAL | 0 refills | Status: DC

## 2017-11-07 NOTE — Telephone Encounter (Signed)
Rcvd refill request for Atorvastatin 40 mg #90

## 2017-11-07 NOTE — Telephone Encounter (Signed)
Done

## 2017-11-14 ENCOUNTER — Other Ambulatory Visit: Payer: Self-pay | Admitting: Rheumatology

## 2017-11-15 NOTE — Telephone Encounter (Signed)
Last Visit: 09/04/17 Next Visit: 04/09/18  Okay to refill Armodafinil?

## 2017-11-15 NOTE — Telephone Encounter (Signed)
ok 

## 2017-12-02 ENCOUNTER — Ambulatory Visit: Payer: 59 | Admitting: Family Medicine

## 2017-12-02 ENCOUNTER — Encounter: Payer: Self-pay | Admitting: Family Medicine

## 2017-12-02 VITALS — BP 118/72 | HR 80 | Temp 99.3°F | Ht 62.0 in | Wt 227.0 lb

## 2017-12-02 DIAGNOSIS — J069 Acute upper respiratory infection, unspecified: Secondary | ICD-10-CM | POA: Diagnosis not present

## 2017-12-02 NOTE — Progress Notes (Signed)
Chief Complaint  Patient presents with  . Nasal Congestion    that started the week before Christmas. Cough has worsened. Had a migraine Fri and Sat. Last night was really rough. Feels like mucus is going to come up but it doesn't. No fever, no body aches or chills. Taking mucinex.    She got sick on 12/19-20 with cold symptoms--head congestion, sore throat, slight cough. She took Mucinex, which eventually helped.  A few days later she started coughing more, took some other OTC meds.  She seemed to be getting better, energy was better.  She finally was feeling well enough to go back to church yesterday morning, but by the end of the day she started feeling much worse.  Last night she was coughing a lot, interfered with her sleep. She complains of sore throat, mostly scratchy rather than painful. She has some discomfort in her right ear ("feels different"), slightly achey, not really painful like an earache.  Nose feels stuffy/congested, mucus is clear when she gets it out.  Her cough triggers a gag--feels like there is something that she isn't able to cough up.  Has not had post-tussive emesis (just a slight gag). No known fever, chills. Last night she had some pressure at her right eye, but this doesn't feel like that today.   37 month old grandson had the flu just after Christmas.  Yesterday she started back taking Mucinex 12 hour, and reports she has also been taking Robitussin DM (2 doses).  PMH, PSH, SH reviewed  Outpatient Encounter Medications as of 12/02/2017  Medication Sig Note  . allopurinol (ZYLOPRIM) 300 MG tablet Take 1 tablet (300 mg total) daily by mouth.   . ALPRAZolam (XANAX) 0.25 MG tablet Take 0.25 mg by mouth 3 (three) times daily as needed for anxiety.    . ARIPiprazole (ABILIFY) 5 MG tablet Take 5 mg by mouth daily.   . Armodafinil 250 MG tablet TAKE 1 TABLET BY MOUTH WITH BREAKFAST   . atorvastatin (LIPITOR) 40 MG tablet Take 1 tablet (40 mg total) by mouth daily.   .  cetirizine (ZYRTEC) 10 MG tablet Take 10 mg by mouth at bedtime.   . cholecalciferol (VITAMIN D) 1000 units tablet Take 1,000 Units by mouth daily.   Marland Kitchen dextromethorphan-guaiFENesin (MUCINEX DM) 30-600 MG 12hr tablet Take 1 tablet by mouth 2 (two) times daily.   . DULoxetine (CYMBALTA) 60 MG capsule Take 60 mg by mouth 2 (two) times daily.    . fluticasone (FLONASE) 50 MCG/ACT nasal spray PLACE 2 SPRAYS INTO BOTH NOSTRILS DAILY.   Marland Kitchen gabapentin (NEURONTIN) 300 MG capsule Take 1 capsule (300 mg total) by mouth at bedtime.   . methocarbamol (ROBAXIN) 500 MG tablet TAKE 1 TABLET AT 7AM AND AT 2PM   . metoprolol tartrate (LOPRESSOR) 50 MG tablet TAKE 1 TABLET BY MOUTH TWICE A DAY   . Multiple Vitamins-Minerals (PRESERVISION AREDS PO) Take 1 capsule by mouth 2 (two) times daily. Reported on 01/02/2016   . nortriptyline (PAMELOR) 25 MG capsule Take 75-100 mg by mouth at bedtime. 07/31/2017: Takes 3 (73m) qHS  . pantoprazole (PROTONIX) 40 MG tablet Take 40 mg by mouth 2 (two) times daily.   . Probiotic Product ([place] CAPS Take 1 capsule by mouth daily. Reported on 01/02/2016   . ranitidine (ZANTAC) 300 MG tablet Take 300 mg by mouth 2 (two) times daily.    .Marland Kitchentopiramate (TOPAMAX) 25 MG tablet Take 2 tablets (50 mg total) by mouth at  bedtime.   Marland Kitchen acetaminophen (TYLENOL) 500 MG tablet Take 500-1,000 mg by mouth every 6 (six) hours as needed for mild pain, moderate pain, fever or headache. Reported on 01/02/2016   . albuterol (PROVENTIL HFA;VENTOLIN HFA) 108 (90 Base) MCG/ACT inhaler Inhale 1-2 puffs into the lungs every 6 (six) hours as needed for wheezing or shortness of breath. Reported on 05/30/2016 (Patient not taking: Reported on 10/31/2017)   . colchicine 0.6 MG tablet Take 2 tablets by mouth at onset of gout flare. Taken an additional tablet 1 hour later, if needed. May take like this every 3d, if needed (Patient not taking: Reported on 10/31/2017)   . ondansetron (ZOFRAN) 8 MG tablet Take 8 mg by  mouth every 8 (eight) hours as needed for nausea or vomiting. Reported on 05/09/2016   . prochlorperazine (COMPAZINE) 10 MG tablet Take 10 mg by mouth every 6 (six) hours as needed for nausea or vomiting. Reported on 05/09/2016   . promethazine (PHENERGAN) 25 MG suppository Place 25 mg rectally every 6 (six) hours as needed for nausea or vomiting. Reported on 05/09/2016    No facility-administered encounter medications on file as of 12/02/2017.     ROS: no known fever, chills.  Sinus headache yesterday, none now.  Slight ear discomfort and cough per HPI.  No chest pain, palpitations, shortness of breath. No diarrhea.  Gastroparesis has been doing very well, no vomiting, and did well off meds for recent studies. Moods are stable. Denies myalgias/arthralgias with this illness, just a little fatigued.   PHYSICAL EXAM:  BP 118/72   Pulse 80   Temp 99.3 F (37.4 C) (Tympanic)   Ht '5\' 2"'  (1.575 m)   Wt 227 lb (103 kg)   LMP 07/27/2006   BMI 41.52 kg/m   Well appearing female, in no distress HEENT: TM's and EAC normal. Conjunctiva and sclera are clear. EOMI. OP is clear. Nasal mucosa--some slight hard yellow crusting only at distal nares. Mucosa is normal, no erythema or inflammation. Minimal tenderness over right maxillary sinus. Neck: no lymphadenopathy or mass Heart: regular rate and rhythm Lungs: clear bilaterally, no wheezes, rales, ronchi Skin: normal turgor, no rash Psych: normal mood, affect, hygiene, grooming Neuro: alert and oriented, cranial nerves intact, normal gait   ASSESSMENT/PLAN:  Viral upper respiratory tract infection  Supportive measures reviewed, and s/sx bacterial infection. Appears to have gotten another viral URI, rather than a secondary bacterial infection related to her recent illness prior to Christmas.    Drink plenty of water, to help keep the mucus thin. Consider trying nasal sinus rinses (sinus rinse kit or Neti-pot) once or twice daily (being sure to  use either distilled or boiled water), to help with your sinus pressure. Do not take BOTH Mucinex (which contains guaifenesin) and Robitussin products (which also contain guaifenesin--the DM version also has dextromethorphan). I recommend that instead of robitussin, you continue the Mucinex twice daily, and use delsym syrup every 12 hours only if needed for cough.  Contact us if you have persistent fevers, if your nasal mucus or phlegm becomes persistently discolored.  I feel like you may have gotten another virus, rather than a bacterial infection related to your last illness. If you continue to get worse, rather than better, let us know--though I don't expect you to feel much better until another 3-5 days.

## 2017-12-02 NOTE — Patient Instructions (Signed)
  Drink plenty of water, to help keep the mucus thin. Consider trying nasal sinus rinses (sinus rinse kit or Neti-pot) once or twice daily (being sure to use either distilled or boiled water), to help with your sinus pressure. Do not take BOTH Mucinex (which contains guaifenesin) and Robitussin products (which also contain guaifenesin--the DM version also has dextromethorphan). I recommend that instead of robitussin, you continue the Mucinex twice daily, and use delsym syrup every 12 hours only if needed for cough.  Contact us if you have persistent fevers, if your nasal mucus or phlegm becomes persistently discolored.  I feel like you may have gotten another virus, rather than a bacterial infection related to your last illness. If you continue to get worse, rather than better, let us know--though I don't expect you to feel much better until another 3-5 days.

## 2017-12-03 ENCOUNTER — Other Ambulatory Visit: Payer: Self-pay | Admitting: Rheumatology

## 2017-12-03 ENCOUNTER — Other Ambulatory Visit: Payer: Self-pay | Admitting: Family Medicine

## 2017-12-03 NOTE — Telephone Encounter (Signed)
Last Visit: 09/04/17 Next Visit: 04/09/18  Okay to refill per Dr. Estanislado Pandy

## 2017-12-05 ENCOUNTER — Telehealth: Payer: Self-pay | Admitting: *Deleted

## 2017-12-05 NOTE — Telephone Encounter (Signed)
Patient is going to come in tomorrow at 9:45am to have [name] take a listen to her chest.

## 2017-12-05 NOTE — Telephone Encounter (Signed)
Patient called and said that you told her to call back Monday if she wasn't feeling better. She was afraid with you being gone tomorrow so she is calling today. Not doing any better-not worse but not better. No fevers, mucus is still clear but she is becoming more SOB when doing things even such as folding laundry-she just wants to go sit down as she becomes short of breath. Tried her inhaler and it's not helping. Please advise, thanks.

## 2017-12-05 NOTE — Telephone Encounter (Signed)
As stated at her visit, I didn't really expect her to feel much better for another 3-5 days, the reason for her to wait until Monday to get back in touch with [place].  Message says not better, but not worse.  I'm concerned about the shortness of breath she described.  Wondering if it is just severe fatigue, vs true shortness of breath.  If truly out of breath (not just tired), someone should listen to her lungs tomorrow (OV).  If she isn't have any fever, no pain with breathing, and no true shortness of breath, then keep on eye on things until Monday.

## 2017-12-06 ENCOUNTER — Encounter (HOSPITAL_COMMUNITY): Payer: Self-pay | Admitting: Physician Assistant

## 2017-12-06 ENCOUNTER — Observation Stay (HOSPITAL_COMMUNITY)
Admission: EM | Admit: 2017-12-06 | Discharge: 2017-12-08 | Disposition: A | Discharge status: Home / Self Care | Payer: 59 | Attending: Cardiovascular Disease | Admitting: Cardiovascular Disease

## 2017-12-06 ENCOUNTER — Ambulatory Visit: Payer: 59 | Admitting: Family Medicine

## 2017-12-06 ENCOUNTER — Encounter: Payer: Self-pay | Admitting: Family Medicine

## 2017-12-06 ENCOUNTER — Emergency Department (HOSPITAL_COMMUNITY): Payer: 59

## 2017-12-06 VITALS — BP 124/82 | HR 124 | Temp 97.8°F | Resp 20

## 2017-12-06 DIAGNOSIS — R000 Tachycardia, unspecified: Secondary | ICD-10-CM

## 2017-12-06 DIAGNOSIS — I200 Unstable angina: Principal | ICD-10-CM | POA: Insufficient documentation

## 2017-12-06 DIAGNOSIS — R0602 Shortness of breath: Secondary | ICD-10-CM

## 2017-12-06 DIAGNOSIS — R079 Chest pain, unspecified: Secondary | ICD-10-CM

## 2017-12-06 DIAGNOSIS — R072 Precordial pain: Secondary | ICD-10-CM

## 2017-12-06 DIAGNOSIS — E669 Obesity, unspecified: Secondary | ICD-10-CM | POA: Diagnosis not present

## 2017-12-06 DIAGNOSIS — R9431 Abnormal electrocardiogram [ECG] [EKG]: Secondary | ICD-10-CM

## 2017-12-06 DIAGNOSIS — Z96651 Presence of right artificial knee joint: Secondary | ICD-10-CM | POA: Insufficient documentation

## 2017-12-06 DIAGNOSIS — Z79899 Other long term (current) drug therapy: Secondary | ICD-10-CM | POA: Diagnosis not present

## 2017-12-06 DIAGNOSIS — D72829 Elevated white blood cell count, unspecified: Secondary | ICD-10-CM | POA: Diagnosis not present

## 2017-12-06 DIAGNOSIS — R0789 Other chest pain: Secondary | ICD-10-CM

## 2017-12-06 DIAGNOSIS — R42 Dizziness and giddiness: Secondary | ICD-10-CM | POA: Diagnosis not present

## 2017-12-06 DIAGNOSIS — R0609 Other forms of dyspnea: Secondary | ICD-10-CM | POA: Diagnosis not present

## 2017-12-06 LAB — I-STAT BETA HCG BLOOD, ED (MC, WL, AP ONLY): I-stat hCG, quantitative: 9.9 m[IU]/mL — ABNORMAL HIGH (ref <5)

## 2017-12-06 LAB — CBC
HCT: 40.5 % (ref 36.0–46.0)
Hemoglobin: 12.3 g/dL (ref 12.0–15.0)
MCH: 28 pg (ref 26.0–34.0)
MCHC: 30.4 g/dL (ref 30.0–36.0)
MCV: 92 fL (ref 78.0–100.0)
Platelets: 396 10*3/uL (ref 150–400)
RBC: 4.4 MIL/uL (ref 3.87–5.11)
RDW: 16 % — ABNORMAL HIGH (ref 11.5–15.5)
WBC: 12 10*3/uL — ABNORMAL HIGH (ref 4.0–10.5)

## 2017-12-06 LAB — BASIC METABOLIC PANEL
Anion gap: 11 (ref 5–15)
BUN: 16 mg/dL (ref 6–20)
CO2: 22 mmol/L (ref 22–32)
Calcium: 8.4 mg/dL — ABNORMAL LOW (ref 8.9–10.3)
Chloride: 107 mmol/L (ref 101–111)
Creatinine, Ser: 0.92 mg/dL (ref 0.44–1.00)
GFR calc Af Amer: >60 mL/min (ref >60)
GFR calc non Af Amer: >60 mL/min (ref >60)
Glucose, Bld: 118 mg/dL — ABNORMAL HIGH (ref 65–99)
Potassium: 3.8 mmol/L (ref 3.5–5.1)
Sodium: 140 mmol/L (ref 135–145)

## 2017-12-06 LAB — TSH: TSH: 1.632 u[IU]/mL (ref 0.350–4.500)

## 2017-12-06 LAB — HEPATIC FUNCTION PANEL
ALT: 22 U/L (ref 14–54)
AST: 25 U/L (ref 15–41)
Albumin: 3.2 g/dL — ABNORMAL LOW (ref 3.5–5.0)
Alkaline Phosphatase: 107 U/L (ref 38–126)
Bilirubin, Direct: 0.1 mg/dL (ref 0.1–0.5)
Indirect Bilirubin: 0.6 mg/dL (ref 0.3–0.9)
Total Bilirubin: 0.7 mg/dL (ref 0.3–1.2)
Total Protein: 6.6 g/dL (ref 6.5–8.1)

## 2017-12-06 LAB — TROPONIN I
Troponin I: <0.03 ng/mL (ref <0.03)
Troponin I: <0.03 ng/mL (ref <0.03)
Troponin I: <0.03 ng/mL (ref <0.03)

## 2017-12-06 LAB — GLUCOSE, POCT (MANUAL RESULT ENTRY): POC Glucose: 180 mg/dl — AB (ref 70–99)

## 2017-12-06 LAB — I-STAT TROPONIN, ED: Troponin i, poc: 0.29 ng/mL (ref 0.00–0.08)

## 2017-12-06 LAB — D-DIMER, QUANTITATIVE: D-Dimer, Quant: 0.29 ug/mL-FEU (ref 0.00–0.50)

## 2017-12-06 MED ORDER — ALBUTEROL SULFATE HFA 108 (90 BASE) MCG/ACT IN AERS: 1.0000 | INHALATION_SPRAY | Freq: Four times a day (QID) | RESPIRATORY_TRACT | Status: DC | PRN

## 2017-12-06 MED ORDER — ASPIRIN EC 81 MG PO TBEC
81.0000 mg | DELAYED_RELEASE_TABLET | Freq: Every day | ORAL | Status: DC
  Administered 2017-12-07: 81 mg via ORAL
  Filled 2017-12-06 (×2): qty 1

## 2017-12-06 MED ORDER — METHOCARBAMOL 500 MG PO TABS: 500.0000 mg | ORAL_TABLET | ORAL | Status: DC

## 2017-12-06 MED ORDER — ATORVASTATIN CALCIUM 40 MG PO TABS
40.0000 mg | ORAL_TABLET | Freq: Every day | ORAL | Status: DC
Start: 2017-12-06 — End: 2017-12-08
  Administered 2017-12-06 – 2017-12-08 (×3): 40 mg via ORAL
  Filled 2017-12-06 (×4): qty 1

## 2017-12-06 MED ORDER — ACETAMINOPHEN 325 MG PO TABS
650.0000 mg | ORAL_TABLET | Freq: Once | ORAL | Status: AC
  Administered 2017-12-06: 650 mg via ORAL
  Filled 2017-12-06: qty 2

## 2017-12-06 MED ORDER — DULOXETINE HCL 60 MG PO CPEP
60.0000 mg | ORAL_CAPSULE | Freq: Two times a day (BID) | ORAL | Status: DC
Start: 2017-12-06 — End: 2017-12-08
  Administered 2017-12-06 – 2017-12-07 (×3): 60 mg via ORAL
  Filled 2017-12-06 (×5): qty 1

## 2017-12-06 MED ORDER — HEPARIN BOLUS VIA INFUSION
4000.0000 [IU] | Freq: Once | INTRAVENOUS | Status: AC
  Administered 2017-12-06: 4000 [IU] via INTRAVENOUS
  Filled 2017-12-06: qty 4000

## 2017-12-06 MED ORDER — NITROGLYCERIN 0.4 MG SL SUBL
0.4000 mg | SUBLINGUAL_TABLET | SUBLINGUAL | Status: DC | PRN
  Administered 2017-12-06 – 2017-12-07 (×3): 0.4 mg via SUBLINGUAL
  Filled 2017-12-06: qty 1

## 2017-12-06 MED ORDER — POLYVINYL ALCOHOL 1.4 % OP SOLN
1.0000 [drp] | OPHTHALMIC | Status: DC | PRN
  Filled 2017-12-06: qty 15

## 2017-12-06 MED ORDER — FLUTICASONE PROPIONATE 50 MCG/ACT NA SUSP
2.0000 | Freq: Every day | NASAL | Status: DC
  Filled 2017-12-06: qty 16

## 2017-12-06 MED ORDER — METOPROLOL TARTRATE 100 MG PO TABS
100.0000 mg | ORAL_TABLET | Freq: Two times a day (BID) | ORAL | Status: DC
  Administered 2017-12-06 – 2017-12-07 (×3): 100 mg via ORAL
  Filled 2017-12-06: qty 1
  Filled 2017-12-06: qty 4
  Filled 2017-12-06 (×2): qty 1

## 2017-12-06 MED ORDER — GUAIFENESIN ER 600 MG PO TB12
600.0000 mg | ORAL_TABLET | Freq: Two times a day (BID) | ORAL | Status: DC
  Administered 2017-12-06 – 2017-12-08 (×4): 600 mg via ORAL
  Filled 2017-12-06 (×4): qty 1

## 2017-12-06 MED ORDER — ACETAMINOPHEN 325 MG PO TABS
650.0000 mg | ORAL_TABLET | ORAL | Status: DC | PRN
  Administered 2017-12-06 – 2017-12-08 (×3): 650 mg via ORAL
  Filled 2017-12-06 (×3): qty 2

## 2017-12-06 MED ORDER — NORTRIPTYLINE HCL 25 MG PO CAPS
75.0000 mg | ORAL_CAPSULE | Freq: Every day | ORAL | Status: DC
  Administered 2017-12-06 – 2017-12-07 (×2): 75 mg via ORAL
  Filled 2017-12-06 (×2): qty 3

## 2017-12-06 MED ORDER — DEXTROMETHORPHAN POLISTIREX ER 30 MG/5ML PO SUER
30.0000 mg | Freq: Two times a day (BID) | ORAL | Status: DC | PRN
  Filled 2017-12-06: qty 5

## 2017-12-06 MED ORDER — HEPARIN (PORCINE) IN NACL 100-0.45 UNIT/ML-% IJ SOLN
1000.0000 [IU]/h | INTRAMUSCULAR | Status: DC
  Administered 2017-12-06: 1000 [IU]/h via INTRAVENOUS
  Filled 2017-12-06: qty 250

## 2017-12-06 MED ORDER — ALPRAZOLAM 0.25 MG PO TABS
0.2500 mg | ORAL_TABLET | Freq: Three times a day (TID) | ORAL | Status: DC | PRN
  Administered 2017-12-06 – 2017-12-08 (×2): 0.25 mg via ORAL
  Filled 2017-12-06 (×2): qty 1

## 2017-12-06 MED ORDER — ARMODAFINIL 250 MG PO TABS: 250.0000 mg | ORAL_TABLET | Freq: Every day | ORAL | Status: DC

## 2017-12-06 MED ORDER — FAMOTIDINE 20 MG PO TABS
40.0000 mg | ORAL_TABLET | Freq: Two times a day (BID) | ORAL | Status: DC
  Administered 2017-12-06 – 2017-12-08 (×4): 40 mg via ORAL
  Filled 2017-12-06 (×4): qty 2

## 2017-12-06 MED ORDER — VITAMIN D 1000 UNITS PO TABS
1000.0000 [IU] | ORAL_TABLET | Freq: Every day | ORAL | Status: DC
  Administered 2017-12-06 – 2017-12-07 (×2): 1000 [IU] via ORAL
  Filled 2017-12-06 (×3): qty 1

## 2017-12-06 MED ORDER — SODIUM CHLORIDE 0.9% FLUSH: 3.0000 mL | INTRAVENOUS | Status: DC | PRN

## 2017-12-06 MED ORDER — SODIUM CHLORIDE 0.9% FLUSH
3.0000 mL | Freq: Two times a day (BID) | INTRAVENOUS | Status: DC
  Administered 2017-12-06 – 2017-12-08 (×3): 3 mL via INTRAVENOUS

## 2017-12-06 MED ORDER — NITROGLYCERIN IN D5W 200-5 MCG/ML-% IV SOLN
0.0000 ug/min | INTRAVENOUS | Status: DC
  Administered 2017-12-06: 10 ug/min via INTRAVENOUS
  Filled 2017-12-06: qty 250

## 2017-12-06 MED ORDER — ALLOPURINOL 300 MG PO TABS
300.0000 mg | ORAL_TABLET | Freq: Every day | ORAL | Status: DC
  Administered 2017-12-07: 300 mg via ORAL
  Filled 2017-12-06 (×3): qty 1

## 2017-12-06 MED ORDER — PANTOPRAZOLE SODIUM 40 MG PO TBEC
40.0000 mg | DELAYED_RELEASE_TABLET | Freq: Two times a day (BID) | ORAL | Status: DC
  Administered 2017-12-06 – 2017-12-08 (×4): 40 mg via ORAL
  Filled 2017-12-06 (×4): qty 1

## 2017-12-06 MED ORDER — ARIPIPRAZOLE 5 MG PO TABS
5.0000 mg | ORAL_TABLET | Freq: Every day | ORAL | Status: DC
  Administered 2017-12-07: 5 mg via ORAL
  Filled 2017-12-06 (×2): qty 1

## 2017-12-06 MED ORDER — HEPARIN SODIUM (PORCINE) 5000 UNIT/ML IJ SOLN
5000.0000 [IU] | Freq: Three times a day (TID) | INTRAMUSCULAR | Status: DC
  Administered 2017-12-06 – 2017-12-08 (×4): 5000 [IU] via SUBCUTANEOUS
  Filled 2017-12-06 (×5): qty 1

## 2017-12-06 MED ORDER — ONDANSETRON HCL 4 MG/2ML IJ SOLN
4.0000 mg | Freq: Four times a day (QID) | INTRAMUSCULAR | Status: DC | PRN
  Administered 2017-12-06 – 2017-12-08 (×4): 4 mg via INTRAVENOUS
  Filled 2017-12-06 (×4): qty 2

## 2017-12-06 MED ORDER — LORATADINE 10 MG PO TABS
10.0000 mg | ORAL_TABLET | Freq: Every day | ORAL | Status: DC
  Administered 2017-12-06 – 2017-12-07 (×2): 10 mg via ORAL
  Filled 2017-12-06 (×2): qty 1

## 2017-12-06 MED ORDER — SODIUM CHLORIDE 0.9 % IV SOLN: 250.0000 mL | INTRAVENOUS | Status: DC | PRN

## 2017-12-06 MED ORDER — TOPIRAMATE 25 MG PO TABS
50.0000 mg | ORAL_TABLET | Freq: Every day | ORAL | Status: DC
  Administered 2017-12-06 – 2017-12-07 (×2): 50 mg via ORAL
  Filled 2017-12-06 (×3): qty 2

## 2017-12-06 NOTE — ED Provider Notes (Signed)
[facility] EMERGENCY DEPARTMENT Provider Note   CSN: [id number] Arrival date & time: 12/06/17  1107     History   Chief Complaint Chief Complaint  Patient presents with  . Chest Pain  . Shortness of Breath    HPI  [name] is a 62 y.o. Female with a history of fibromyalgia, GERD, obstructive sleep apnea, anxiety, depression, hyperlipidemia, gastroparesis and neuropathy, who presents to the ED via EMS for evaluation of chest pain and dyspnea.  Reports she has been fighting a cold since Christmas, but yesterday started to have exertional shortness of breath, and some intermittent chest pain relieved with rest, today dyspnea got worse and was present at rest and chest pain worsened as well.  Patient describes chest pain as central tightness and pressure, that radiates to the neck and left arm.  Patient went to her PCP with the symptoms today and was sent to the ED via EMS for concern for ACS.  Patient received nitroglycerin and 324 of aspirin prior to arrival which is chest pain somewhat bu she is still having pain, which is exacerbated with movement.  Patient reports previous episode of chest pain years ago, which was much less severe tand attributed to GERD.  Patient denies any previous history of cardiac events. Pt has had echo and Lexiscan perfusion study in 2016 with Dr. Johnsie Cancel, echo showed EF of 60-65%, normal perfusion study without evidence of ischemia.      Past Medical History:  Diagnosis Date  . Anemia    previously followed by Dr. Jamse Arn for anemia and elevated platelets  . Anxiety   . C. difficile colitis 10/01/12   treated by [facility]  . Chronic fatigue syndrome   . DDD (degenerative disc disease), lumbar 08/19/14   and facet arthroplasty & left lumbar radiculopathy (Dr.Ramos)  . Depression   . Dyssynergia    dyssynergenic defecation, contributing to fecal incontinence.  . Edema   . Fibromyalgia   . Gastroparesis    followed at [facility]  . GERD  (gastroesophageal reflux disease)   . Hyperlipidemia   . Kidney stone   . Lumbar radiculopathy   . Migraine   . Neuropathy   . Obstructive sleep apnea    On Cpap 2009  . Paresthesia    Dr. Everette Rank at [facility]  . Pelvic floor dysfunction    pelvic floor dyssynergy  . Plantar fasciitis 02/2011   R foot  . Pneumonia    2012  . PONV (postoperative nausea and vomiting)    pt states has gastroparesis has difficulty taking antibiotics and narcotics has severe nausea and vomiting   . Restless leg syndrome   . S/P endometrial ablation 08/09/2006   Novasure Ablation  . S/P epidural steroid injection 09/20/14   Dr.Ramos  . Shortness of breath dyspnea    bending over; exertion   . Tremor    Dr. Everette Rank  . Urinary frequency   . Urinary incontinence     Patient Active Problem List   Diagnosis Date Noted  . DJD (degenerative joint disease), cervical 12/24/2016  . Primary osteoarthritis of both hips 12/24/2016  . Primary osteoarthritis of both knees 12/24/2016  . H/O total knee replacement, right 12/24/2016  . Spondylosis of lumbar region without myelopathy or radiculopathy 12/24/2016  . Acute gout 05/30/2016  . Obstructive apnea 05/10/2016  . Myalgia 03/29/2016  . Other long term (current) drug therapy 03/29/2016  . Idiopathic peripheral neuropathy 03/29/2016  . Cannot sleep 03/29/2016  .  Migraine without aura and responsive to treatment 03/29/2016  . Multifocal myoclonus 03/29/2016  . Restless leg 03/29/2016  . Has a tremor 03/29/2016  . Dyspnea 01/25/2016  . History of aspiration pneumonitis 01/25/2016  . History of acute bronchitis 01/25/2016  . LPRD (laryngopharyngeal reflux disease) 01/25/2016  . Imbalance 01/09/2016  . Serotonin syndrome 12/22/2015  . OA (osteoarthritis) of knee 09/05/2015  . Obesity, Class II, BMI 35-39.9, with comorbidity ([place]) 05/17/2015  . OSA (obstructive sleep apnea) 03/16/2013  . Iron deficiency 11/06/2012  . Thrombocythemia ([place])  11/06/2012  . Elevated WBC count 11/05/2012  . Impaired fasting glucose 07/23/2012  . Bronchitis 04/03/2012  . Kidney stone on left side 03/05/2012  . Kidney cysts 03/04/2012  . Loose stools 03/04/2012  . Urinary frequency 12/04/2011  . Restless leg syndrome 12/04/2011  . Polypharmacy 12/04/2011  . Bladder incontinence 12/04/2011  . Fibromyalgia   . Hyperlipidemia   . Edema   . S/P endometrial ablation   . Allergic rhinitis 09/18/2011  . Pneumonia, aspiration ([place]) 05/27/2011  . Depression, major, single episode, in partial remission ([place]) 04/21/2011  . OVERWEIGHT 02/17/2010  . PRECORDIAL PAIN 02/17/2010  . Anxiety state 01/30/2010  . Migraine 01/30/2010  . GERD 01/30/2010  . Gastroparesis 01/30/2010    Past Surgical History:  Procedure Laterality Date  . CHOLECYSTECTOMY  9/05  . ENDOMETRIAL ABLATION  08/09/2006   Dr. Valentina Shaggy Ablation  . FACET JOINT INJECTION  04/17/2017   Left L4-5 and L5-S1  . KNEE ARTHROPLASTY    . KNEE SURGERY  1999   R knee, Dr. Eddie Dibbles, torn cartilage  . TONSILLECTOMY  1968  . TONSILLECTOMY    . TOTAL KNEE ARTHROPLASTY Right 09/05/2015   Procedure: RIGHT TOTAL KNEE ARTHROPLASTY;  Surgeon: Gaynelle Arabian, MD;  Location: [facility];  Service: Orthopedics;  Laterality: Right;    OB History    Gravida Para Term Preterm AB Living   1 1 1     1    SAB TAB Ectopic Multiple Live Births                   Home Medications    Prior to Admission medications   Medication Sig Start Date End Date Taking? Authorizing Provider  acetaminophen (TYLENOL) 500 MG tablet Take 500-1,000 mg by mouth every 6 (six) hours as needed for mild pain, moderate pain, fever or headache. Reported on 01/02/2016    [provider]  albuterol (PROVENTIL HFA;VENTOLIN HFA) 108 (90 Base) MCG/ACT inhaler Inhale 1-2 puffs into the lungs every 6 (six) hours as needed for wheezing or shortness of breath. Reported on 05/30/2016 Patient not taking: Reported on 10/31/2017 06/17/17    Noralee Space, MD  allopurinol (ZYLOPRIM) 300 MG tablet Take 1 tablet (300 mg total) daily by mouth. 09/30/17   Rita Ohara, MD  ALPRAZolam Duanne Moron) 0.25 MG tablet Take 0.25 mg by mouth 3 (three) times daily as needed for anxiety.     [provider]  ARIPiprazole (ABILIFY) 5 MG tablet Take 5 mg by mouth daily. 04/23/17   [provider]  Armodafinil 250 MG tablet TAKE 1 TABLET BY MOUTH WITH BREAKFAST 11/15/17   Bo Merino, MD  atorvastatin (LIPITOR) 40 MG tablet Take 1 tablet (40 mg total) by mouth daily. 11/07/17   Rita Ohara, MD  cetirizine (ZYRTEC) 10 MG tablet Take 10 mg by mouth at bedtime.    [provider]  cholecalciferol (VITAMIN D) 1000 units tablet Take 1,000 Units by mouth daily.  [provider]  colchicine 0.6 MG tablet Take 2 tablets by mouth at onset of gout flare. Taken an additional tablet 1 hour later, if needed. May take like this every 3d, if needed Patient not taking: Reported on 10/31/2017 12/19/16   Rita Ohara, MD  dextromethorphan-guaiFENesin [facility] DM) 30-600 MG 12hr tablet Take 1 tablet by mouth 2 (two) times daily.    [provider]  DULoxetine (CYMBALTA) 60 MG capsule Take 60 mg by mouth 2 (two) times daily.  05/15/16   [provider]  fluticasone (FLONASE) 50 MCG/ACT nasal spray PLACE 2 SPRAYS INTO BOTH NOSTRILS DAILY. 11/20/16   Rita Ohara, MD  gabapentin (NEURONTIN) 300 MG capsule Take 1 capsule (300 mg total) by mouth at bedtime. 05/17/16   Hyatt, Max T, DPM  methocarbamol (ROBAXIN) 500 MG tablet TAKE 1 TABLET AT 7AM AND AT [facility] 12/03/17   Bo Merino, MD  metoprolol tartrate (LOPRESSOR) 50 MG tablet TAKE 1 TABLET BY MOUTH TWICE A DAY 10/07/17   Rita Ohara, MD  metoprolol tartrate (LOPRESSOR) 50 MG tablet TAKE 1 TABLET BY MOUTH TWICE A DAY 12/03/17   Rita Ohara, MD  Multiple Vitamins-Minerals (PRESERVISION AREDS PO) Take 1 capsule by mouth 2 (two) times daily. Reported on 01/02/2016    [provider]  nortriptyline (PAMELOR) 25 MG capsule Take 75-100 mg by mouth at bedtime.    [provider]  ondansetron (ZOFRAN) 8 MG tablet Take 8 mg by mouth every 8 (eight) hours as needed for nausea or vomiting. Reported on 05/09/2016    [provider]  pantoprazole (PROTONIX) 40 MG tablet Take 40 mg by mouth 2 (two) times daily.    [provider]  Probiotic Product ([place]) CAPS Take 1 capsule by mouth daily. Reported on 01/02/2016    [provider]  prochlorperazine (COMPAZINE) 10 MG tablet Take 10 mg by mouth every 6 (six) hours as needed for nausea or vomiting. Reported on 05/09/2016    [provider]  promethazine (PHENERGAN) 25 MG suppository Place 25 mg rectally every 6 (six) hours as needed for nausea or vomiting. Reported on 05/09/2016    [provider]  ranitidine (ZANTAC) 300 MG tablet Take 300 mg by mouth 2 (two) times daily.     [provider]  topiramate (TOPAMAX) 25 MG tablet Take 2 tablets (50 mg total) by mouth at bedtime. 10/15/17   Bo Merino, MD    Family History Family History  Problem Relation Age of Onset  . Allergies Mother   . Hypertension Mother   . Heart disease Mother        possible valve problem  . Macular degeneration Mother   . Heart disease Father        pacemaker, CHF  . Hypertension Father   . Diabetes Father        borderline  . Stroke Father 89  . Kidney disease Father   . Asthma Sister   . Irritable bowel syndrome Sister   . Allergies Sister   . Heart disease Paternal Grandmother   . Heart disease Paternal Grandfather   . Cancer Maternal Aunt        leukemia  . Cancer Maternal Aunt   . Colon cancer Maternal Aunt        late 87's    Social History Social History   Tobacco Use  . Smoking status: Never Smoker  . Smokeless tobacco: Never Used  Substance Use Topics  . Alcohol use: No  Alcohol/week: 0.0 oz  . Drug use: No     Allergies     Erythromycin; Tramadol; and Dilaudid [hydromorphone hcl]   Review of Systems Review of Systems  Constitutional: Positive for diaphoresis. Negative for fever.  Respiratory: Positive for cough, chest tightness and shortness of breath. Negative for wheezing and stridor.   Cardiovascular: Positive for chest pain. Negative for palpitations and leg swelling.  Gastrointestinal: Positive for nausea. Negative for abdominal pain, blood in stool, diarrhea and vomiting.  Musculoskeletal: Positive for neck pain. Negative for back pain.  Skin: Negative for rash.  Neurological: Positive for light-headedness. Negative for dizziness, weakness, numbness and headaches.     Physical Exam Updated Vital Signs BP 126/87   Pulse (!) 113   LMP 07/27/2006   SpO2 99%   Physical Exam  Constitutional: She is oriented to person, place, and time. She appears well-developed and well-nourished. No distress.  HENT:  Head: Normocephalic and atraumatic.  Mouth/Throat: Oropharynx is clear and moist.  Eyes: Right eye exhibits no discharge. Left eye exhibits no discharge.  Neck: Normal range of motion. Neck supple.  Cardiovascular: Regular rhythm, normal heart sounds and intact distal pulses.  Pulses:      Radial pulses are 2+ on the right side, and 2+ on the left side.       Dorsalis pedis pulses are 2+ on the right side, and 2+ on the left side.  Tachycardia  Pulmonary/Chest: Effort normal and breath sounds normal. No stridor. No respiratory distress. She has no wheezes. She has no rales.  Abdominal: Soft. Bowel sounds are normal. She exhibits no distension and no mass. There is no tenderness. There is no guarding.  Musculoskeletal: She exhibits no edema or deformity.  Neurological: She is alert and oriented to person, place, and time. Coordination normal.  Skin: Skin is warm. Capillary refill takes less than 2 seconds. She is diaphoretic.  Psychiatric: She has a normal mood and affect. Her behavior is normal.   Nursing note and vitals reviewed.    ED Treatments / Results  Labs (all labs ordered are listed, but only abnormal results are displayed) Labs Reviewed  BASIC METABOLIC PANEL - Abnormal; Notable for the following components:      Result Value   Glucose, Bld 118 (*)    Calcium 8.4 (*)    All other components within normal limits  CBC - Abnormal; Notable for the following components:   WBC 12.0 (*)    RDW 16.0 (*)    All other components within normal limits  I-STAT TROPONIN, ED - Abnormal; Notable for the following components:   Troponin i, poc 0.29 (*)    All other components within normal limits  I-STAT BETA HCG BLOOD, ED ([facility], [facility], [facility] ONLY) - Abnormal; Notable for the following components:   I-stat hCG, quantitative 9.9 (*)    All other components within normal limits  TROPONIN I  D-DIMER, QUANTITATIVE (NOT AT [facility])  HEPARIN LEVEL (UNFRACTIONATED)  BRAIN NATRIURETIC PEPTIDE    EKG  EKG Interpretation  Date/Time:  Friday December 06 2017 11:12:32 EST Ventricular Rate:  114 PR Interval:    QRS Duration: 102 QT Interval:  347 QTC Calculation: 478 R Axis:   -111 Text Interpretation:  Sinus tachycardia LAD, consider left anterior fascicular block Consider anterior infarct Poor R wave progression Confirmed by Virgel Manifold [phone]) on 12/06/2017 11:52:32 AM       Radiology Dg Chest 2 View  Result Date: 12/06/2017 CLINICAL DATA:  Chest pain.  Shortness  of breath. EXAM: CHEST  2 VIEW COMPARISON:  12/24/2015. FINDINGS: Mediastinum and hilar structures normal. Heart size normal. Low lung volumes with mild basilar atelectasis and/or scarring again noted. No acute abnormality. No pleural effusion or pneumothorax. IMPRESSION: Low lung volumes with mild basilar atelectasis and/or scarring again noted. Exam is stable from prior exam. No acute abnormality identified. Electronically Signed   By: Marcello Moores  Register   On: 12/06/2017 12:06    Procedures .Critical Care Performed by:  Jacqlyn Larsen, PA-C Authorized by: Jacqlyn Larsen, PA-C   Critical care provider statement:    Critical care time (minutes):  45   Critical care start time:  12/06/2017 11:45 AM   Critical care end time:  12/06/2017 12:30 PM   Critical care was necessary to treat or prevent imminent or life-threatening deterioration of the following conditions:  Cardiac failure   Critical care was time spent personally by me on the following activities:  Development of treatment plan with patient or surrogate, discussions with consultants, evaluation of patient's response to treatment, examination of patient, obtaining history from patient or surrogate, re-evaluation of patient's condition, ordering and review of radiographic studies and ordering and review of laboratory studies   (including critical care time)  Medications Ordered in ED Medications  heparin ADULT infusion 100 units/mL (25000 units/275mL sodium chloride 0.45%) (1,000 Units/hr Intravenous New Bag/Given 12/06/17 1220)  nitroGLYCERIN 50 mg in dextrose 5 % 250 mL (0.2 mg/mL) infusion (20 mcg/min Intravenous Rate/Dose Change 12/06/17 1505)  acetaminophen (TYLENOL) tablet 650 mg (not administered)  heparin bolus via infusion 4,000 Units (4,000 Units Intravenous Bolus from Bag 12/06/17 1218)     Initial Impression / Assessment and Plan / ED Course  I have reviewed the triage vital signs and the nursing notes.  Pertinent labs & imaging results that were available during my care of the patient were reviewed by me and considered in my medical decision making (see chart for details).  Presents complaining of exertional chest pain, that radiates to the neck and down the left arm, associated with shortness of breath that is worse with movement.  No history of cardiac events.  On exam patient is tachycardic but vitals otherwise stable.  Lungs clear to auscultation.  Abdomen benign.  EKG without acute ischemic changes, although story is very concerning for  ACS.  Patient received 1 nitro and 324 of aspirin in transit which eased her chest pain, but patient still complaining of 7 out of 10 chest pain.  Discussed patient with Dr. Wilson Singer who recommends starting heparin and nitro drip.  And will see and evaluate the patient.  Chest x-ray without evidence of acute cardiopulmonary disease.  Does show low lung volumes and mild basilar atelectasis, but this is not a new finding.  Old white count of 12.0, hemoglobin normal.  No electrolyte derangements requiring intervention in the ED.  Awaiting troponin level.  Given concerning story for ACS we will go ahead and consult cardiology, patient saw Dr. Johnsie Cancel back in 2016.   Cards will see patient in the ED.  1:28 PM Notified by lab troponin of 0.29  3:11 PM Pt reports some improvement in pain since Nitro drip was started, pain now primarily in left arm, normal work of breathing.   Melina Copa PA-C with cardiology saw pt and Cardiology will admit the patient for further cardiac workup.  Patient discussed with Dr. Wilson Singer, who saw patient as well and agrees with plan.   Final Clinical Impressions(s) / ED Diagnoses  Final diagnoses:  Unstable angina [facility])  Chest pain    ED Discharge Orders    None       Janet Berlin 12/06/17 1618    Virgel Manifold, MD 12/06/17 [phone]

## 2017-12-06 NOTE — ED Notes (Signed)
PA made this RN aware that mini lab did not receive Istat troponin. This RN put original I-stat on rocker in mini lab. This RN recollected I-stat troponin. Mini lab aware of repeat sample at this time.

## 2017-12-06 NOTE — ED Triage Notes (Signed)
Pt BIB EMS from her doctors office for CP/SOB. Pt went to MD today d/t exertional SOB and "feeling bad since xmas." Pt reports CP since yesterday with radiation to left arm and intermittent lightheadedness that started today. Pt received 1 nitro and 324 ASA PTA. A&Ox4; resp e/u; NAD at this time.

## 2017-12-06 NOTE — ED Notes (Signed)
Pt received heart healthy dinner tray.

## 2017-12-06 NOTE — ED Notes (Signed)
ED Provider at bedside. 

## 2017-12-06 NOTE — Progress Notes (Signed)
  Subjective:  [name] is a 62 y.o. female who presents for a 2 day history of shortness of breath, intermittent dizziness, nausea and this morning she started having anterior chest pain that feels like "tighntess". Pain present at rest. Pain is non radiating.   Upon entering the room the patient is hanging her head over a trash can and dry heaving, pale and diaphoretic. Complaints of worsening chest pain.   Dyspnea yesterday with exertion but today she is having dyspnea at rest.    ROS as in subjective.   Objective: Vitals:   12/06/17 0937 12/06/17 1014  BP: 124/82   Pulse: (!) 112 (!) 124  Resp: 14 20  Temp: 97.8 F (36.6 C)   SpO2: 94% 93%    General appearance: Alert, oreinted, mild respiratory distress, ill appearing, pale                              Skin: cool, clammy,                            Head: no sinus tenderness                            Eyes: conjunctiva normal, corneas clear, PERRLA                            Ears: pearly TMs, external ear canals normal                          Nose: septum midline, turbinates swollen, with erythema and clear discharge             Mouth/throat: MMM, tongue normal, mild pharyngeal erythema                           Neck: supple, no adenopathy, no thyromegaly, nontender                          Heart: sinus tach                         Lungs: tachypnea, CTA bilaterally, no wheezes, rales, or rhonchi      Assessment: Shortness of breath - Plan: Electrocardiogram report, EKG 12-Lead  Dizziness - Plan: Glucose (CBG)  Chest pain, unspecified type - Plan: Electrocardiogram report, EKG 12-Lead  Abnormal ECG   Plan: ECG abnormal. Patient's pain and dizziness worsening throughout visit.  EMS called and placed her on 2L [place].  Possible acute cardiac event.

## 2017-12-06 NOTE — H&P (Signed)
Cardiology History & Physical    Patient ID: [name] MRN: [id number], DOB: 14-Feb-1956 Date of Encounter: 12/06/2017, 3:02 PM Primary Physician: Rita Ohara, MD Primary Cardiologist:Dr. Johnsie Cancel remotely  Chief Complaint: chest pain, shortness of breath Reason for Admission: elevated troponin Requesting MD: Dr. Wilson Singer  HPI: [name] is a 62 y.o. female with history of Fibromyalgia, gastroparesis, anxiety, acid reflux, chronic fatigue, neuropathy, hyperlipidemia, migraines, OSA (has not tolerated CPAP), RLS, GERD whom we are asked to see for chest pain, shortness of breath and abnormal troponin.  She remotely saw Dr. Johnsie Cancel for pre-op clearance at which time echo 07/2015 showed mild LVH, EF 60-65%, grade 1 DD. Nuclear stress test 07/2015 was normal. HR was noted to be around 100 at that time at baseline.She recently had a URI the week before Christmas treated symptomatically. She initially got better, but then a week ago began coughing more (clear sputum only) so saw PCP on Monday 12/02/17. At that time lungs were felt to be clear and she was advised to continue Mucinex and Delsym and follow up closely if any progression. Yesterday she was caring for 27 month old grandson and noticed significant dyspnea whenever she exerted herself as well as a sensation of dizziness and dyspnea anytime she went to bend over and stand back up. She also began noticing intermittent chest pain which she finds hard to quantify, occurring at random times through the day but also with exertion; she did have one episode yesterday evening while doing nothing at all. No recent LEE, orthopnea, long surgery, travel, bedrest. She also has noticed a discomfort in her left upper shoulder blade and neck today. Today she went back to primary care and at that time became dizzy while signing in so she was urgently tended to. She was afebrile per their vitals, but patient reports she drooped her sats into the 80s and HR was in the  115-125 range. She was dry heaving, pale and diaphoretic at visit so EMS was called. She got 1 SL NTG per triage reports but due to flurry of activity, the patient doesn't recall whether this helped. It did give her a headache. She received 324mg  ASA. Although not SOB at rest, she reports anytime she has to go to sit up, she feels quite dyspneic. She also still feels a general mild sense of tightness across her chest. She was started on heparin and NTG gtt.   CXR showed low lung volumes with mild bibasilar atelectasis and/or scarring noted again, no acute abormality, no pleural effusion, normal heart size. Labs reveal WBC 12k, troponin 0.29 (istat), K 3.8, Cr 0.92, hcg 9.9 (post-menopausal). She is a nonsmoker, no family history of CAD (+fam hx of leaky valve in mother, PPM in father).  Past Medical History:  Diagnosis Date  . Anemia    previously followed by Dr. Jamse Arn for anemia and elevated platelets  . Anxiety   . C. difficile colitis 10/01/12   treated by [facility]  . Chronic fatigue syndrome   . DDD (degenerative disc disease), lumbar 08/19/14   and facet arthroplasty & left lumbar radiculopathy (Dr.Ramos)  . Depression   . Dyssynergia    dyssynergenic defecation, contributing to fecal incontinence.  . Edema   . Fibromyalgia   . Gastroparesis    followed at [facility]  . GERD (gastroesophageal reflux disease)   . Hyperlipidemia   . Kidney stone   . Lumbar radiculopathy   . Migraine   . Neuropathy   .  Obstructive sleep apnea    Does not wear CPAP  . Paresthesia    Dr. Everette Rank at [facility]  . Pelvic floor dysfunction    pelvic floor dyssynergy  . Plantar fasciitis 02/2011   R foot  . Pneumonia    2012  . PONV (postoperative nausea and vomiting)    pt states has gastroparesis has difficulty taking antibiotics and narcotics has severe nausea and vomiting   . Restless leg syndrome   . S/P endometrial ablation 08/09/2006   Novasure Ablation  . S/P epidural steroid injection  09/20/14   Dr.Ramos  . Tremor    Dr. Everette Rank  . Urinary frequency   . Urinary incontinence      Surgical History:  Past Surgical History:  Procedure Laterality Date  . CHOLECYSTECTOMY  9/05  . ENDOMETRIAL ABLATION  08/09/2006   Dr. Valentina Shaggy Ablation  . FACET JOINT INJECTION  04/17/2017   Left L4-5 and L5-S1  . KNEE ARTHROPLASTY    . KNEE SURGERY  1999   R knee, Dr. Eddie Dibbles, torn cartilage  . TONSILLECTOMY  1968  . TONSILLECTOMY    . TOTAL KNEE ARTHROPLASTY Right 09/05/2015   Procedure: RIGHT TOTAL KNEE ARTHROPLASTY;  Surgeon: Gaynelle Arabian, MD;  Location: [facility];  Service: Orthopedics;  Laterality: Right;     Home Meds: Prior to Admission medications   Medication Sig Start Date End Date Taking? Authorizing Provider  albuterol (PROVENTIL HFA;VENTOLIN HFA) 108 (90 Base) MCG/ACT inhaler Inhale 1-2 puffs into the lungs every 6 (six) hours as needed for wheezing or shortness of breath. Reported on 05/30/2016 06/17/17  Yes Noralee Space, MD  allopurinol (ZYLOPRIM) 300 MG tablet Take 1 tablet (300 mg total) daily by mouth. 09/30/17  Yes Rita Ohara, MD  ALPRAZolam Duanne Moron) 0.25 MG tablet Take 0.25 mg by mouth 3 (three) times daily as needed for anxiety.    Yes [provider]  ARIPiprazole (ABILIFY) 5 MG tablet Take 5 mg by mouth daily. 04/23/17  Yes [provider]  Armodafinil 250 MG tablet TAKE 1 TABLET BY MOUTH WITH BREAKFAST Patient taking differently: TAKE 250MG  BY MOUTH ONCE DAILY WITH BREAKFAST 11/15/17  Yes Deveshwar, Abel Presto, MD  atorvastatin (LIPITOR) 40 MG tablet Take 1 tablet (40 mg total) by mouth daily. 11/07/17  Yes Rita Ohara, MD  cetirizine (ZYRTEC) 10 MG tablet Take 10 mg by mouth at bedtime.   Yes [provider]  cholecalciferol (VITAMIN D) 1000 units tablet Take 1,000 Units by mouth daily.   Yes [provider]  dextromethorphan (DELSYM) 30 MG/5ML liquid Take 30 mg by mouth 2 (two) times daily.   Yes [provider]    DULoxetine (CYMBALTA) 60 MG capsule Take 60 mg by mouth 2 (two) times daily.  05/15/16  Yes [provider]  fluticasone (FLONASE) 50 MCG/ACT nasal spray PLACE 2 SPRAYS INTO BOTH NOSTRILS DAILY. 11/20/16  Yes Rita Ohara, MD  guaiFENesin (MUCINEX) 600 MG 12 hr tablet Take 600 mg by mouth 2 (two) times daily.   Yes [provider]  methocarbamol (ROBAXIN) 500 MG tablet TAKE 1 TABLET AT 7AM AND AT 2PM Patient taking differently: TAKE 500MG  BY MOUTH TWICE DAILY AT 7AM AND AT [facility] 12/03/17  Yes Deveshwar, Abel Presto, MD  metoprolol tartrate (LOPRESSOR) 50 MG tablet TAKE 1 TABLET BY MOUTH TWICE A DAY Patient taking differently: TAKE 50MG  BY MOUTH TWICE DAILY 10/07/17  Yes Rita Ohara, MD  Multiple Vitamins-Minerals (PRESERVISION AREDS PO) Take 1 capsule by mouth 2 (two) times daily.  Reported on 01/02/2016   Yes [provider]  nortriptyline (PAMELOR) 25 MG capsule Take 75-100 mg by mouth at bedtime.   Yes [provider]  pantoprazole (PROTONIX) 40 MG tablet Take 40 mg by mouth 2 (two) times daily.   Yes [provider]  Probiotic Product ([place]) CAPS Take 1 capsule by mouth daily. Reported on 01/02/2016   Yes [provider]  prochlorperazine (COMPAZINE) 10 MG tablet Take 10 mg by mouth every 6 (six) hours as needed for nausea or vomiting. Reported on 05/09/2016   Yes [provider]  promethazine (PHENERGAN) 25 MG suppository Place 25 mg rectally every 6 (six) hours as needed for nausea or vomiting. Reported on 05/09/2016   Yes [provider]  Propylene Glycol (SYSTANE COMPLETE) 0.6 % SOLN Place 1 drop into both eyes 4 (four) times daily.   Yes [provider]  ranitidine (ZANTAC) 300 MG tablet Take 300 mg by mouth 2 (two) times daily.    Yes [provider]  topiramate (TOPAMAX) 25 MG tablet Take 2 tablets (50 mg total) by mouth at bedtime. 10/15/17  Yes Deveshwar, Abel Presto, MD  colchicine 0.6 MG tablet Take 2  tablets by mouth at onset of gout flare. Taken an additional tablet 1 hour later, if needed. May take like this every 3d, if needed Patient not taking: Reported on 12/06/2017 12/19/16   Rita Ohara, MD  gabapentin (NEURONTIN) 300 MG capsule Take 1 capsule (300 mg total) by mouth at bedtime. Patient not taking: Reported on 12/06/2017 05/17/16   Tyson Dense T, DPM  metoprolol tartrate (LOPRESSOR) 50 MG tablet TAKE 1 TABLET BY MOUTH TWICE A DAY Patient not taking: Reported on 12/06/2017 12/03/17   Rita Ohara, MD    Allergies:  Allergies  Allergen Reactions  . Erythromycin Nausea Only  . Tramadol Itching  . Dilaudid [Hydromorphone Hcl] Itching    Social History   Socioeconomic History  . Marital status: Married    Spouse name: Not on file  . Number of children: 2  . Years of education: Not on file  . Highest education level: Not on file  Social Needs  . Financial resource strain: Not on file  . Food insecurity - worry: Not on file  . Food insecurity - inability: Not on file  . Transportation needs - medical: Not on file  . Transportation needs - non-medical: Not on file  Occupational History  . Occupation: Therapist, art (on disability)    Employer: [employer]  Tobacco Use  . Smoking status: Never Smoker  . Smokeless tobacco: Never Used  Substance and Sexual Activity  . Alcohol use: No    Alcohol/week: 0.0 oz  . Drug use: No  . Sexual activity: Not Currently    Birth control/protection: Post-menopausal    Comment: 1st intercourse 61 yo-Fewer than 5 partners  Other Topics Concern  . Not on file  Social History Narrative   Married, 1 dog. 1 son in [place] (expecting first child end 06/2017), 1 stepson in [place], with 2 children     Family History  Problem Relation Age of Onset  . Allergies Mother   . Hypertension Mother   . Heart disease Mother        possible valve problem - leaking valve  . Macular degeneration Mother   . Heart disease Father        pacemaker, CHF  .  Hypertension Father   . Diabetes Father  borderline  . Stroke Father 39  . Kidney disease Father   . Asthma Sister   . Irritable bowel syndrome Sister   . Allergies Sister   . Heart disease Paternal Grandmother   . Heart disease Paternal Grandfather   . Cancer Maternal Aunt        leukemia  . Cancer Maternal Aunt   . Colon cancer Maternal Aunt        late 43's  . CAD Neg Hx     Review of Systems: All other systems reviewed and are otherwise negative except as noted above.  Labs:   Lab Results  Component Value Date   WBC 12.0 (H) 12/06/2017   HGB 12.3 12/06/2017   HCT 40.5 12/06/2017   MCV 92.0 12/06/2017   PLT 396 12/06/2017    Recent Labs  Lab 12/06/17 1133  NA 140  K 3.8  CL 107  CO2 22  BUN 16  CREATININE 0.92  CALCIUM 8.4*  GLUCOSE 118*   No results for input(s): CKTOTAL, CKMB, TROPONINI in the last 72 hours. Lab Results  Component Value Date   CHOL 133 10/03/2017   HDL 49 (L) 10/03/2017   LDLCALC 92 01/21/2017   TRIG 84 10/03/2017   Lab Results  Component Value Date   DDIMER 0.29 12/06/2017    Radiology/Studies:  Dg Chest 2 View  Result Date: 12/06/2017 CLINICAL DATA:  Chest pain.  Shortness of breath. EXAM: CHEST  2 VIEW COMPARISON:  12/24/2015. FINDINGS: Mediastinum and hilar structures normal. Heart size normal. Low lung volumes with mild basilar atelectasis and/or scarring again noted. No acute abnormality. No pleural effusion or pneumothorax. IMPRESSION: Low lung volumes with mild basilar atelectasis and/or scarring again noted. Exam is stable from prior exam. No acute abnormality identified. Electronically Signed   By: Marcello Moores  Register   On: 12/06/2017 12:06   Wt Readings from Last 3 Encounters:  12/02/17 227 lb (103 kg)  10/31/17 225 lb 3.2 oz (102.2 kg)  09/04/17 224 lb (101.6 kg)    GMW:NUUVO tach 116bpm, no acute ST-T Changes  Physical Exam: Blood pressure (!) 105/57, pulse (!) 113, resp. rate 18, last menstrual period  07/27/2006, SpO2 97 %. There is no height or weight on file to calculate BMI. General: Well developed, well nourished obese WF, in no acute distress. Head: Normocephalic, atraumatic, sclera non-icteric, no xanthomas, nares are without discharge.  Neck: Negative for carotid bruits. JVD not elevated. Lungs: Clear bilaterally to auscultation without wheezes, rales, or rhonchi. Breathing is unlabored. Heart: Reg rhythm mildly tachycardic with S1 S2. No murmurs, rubs, or gallops appreciated. Abdomen: Soft, non-tender, non-distended with normoactive bowel sounds. No hepatomegaly. No rebound/guarding. No obvious abdominal masses. Msk:  Strength and tone appear normal for age. Extremities: No clubbing or cyanosis. No edema.  Distal pedal pulses are 2+ and equal bilaterally. Neuro: Alert and oriented X 3. No focal deficit. No facial asymmetry. Moves all extremities spontaneously. Psych:  Responds to questions appropriately with a normal affect.    Assessment and Plan   1. Chest pain/dyspnea with elevated troponin, sinus tachycardia, and patient-reported hypoxia - some features of her story are consistent with NSTEMI/ischemia but there seem to be a few features which are less typical, such as the hypoxia, worsened sinus tach, and recent URI-type symptoms.She states she has a hard time sorting out some of the residual discomfort from her fibromyalgia pain. CXR argues against acute fluid accumulation either pleural or pericardial. She is not wheezing and lungs are clear. Her  current discomfort is easily reproducible with palpation. Repeat troponin was normal along with d-dimer. BNP pending. EKG nonacute aside from mild rate increase from baseline. Will discuss further evaluation with MD - per our preliminary discussion will d/c NTG and heparin, rule out overnight and plan coronary CTA in AM. I sent Dr. Aundra Dubin a message informing him this will be ordered. We will also increase beta blocker. If HR unable to get  down to target for CT, will need nuc instead. Also sent FYI to AM APPs for tomorrow. Continue aspirin in interim.  2. Sinus tach - baseline HR appears around 100. Her psychiatric regimen may be contributing (I.e. Amitriptyline?) but HR higher than usual. Follow. Plan workup as above. Check thyroid.  2. Leukocytosis - ?stress demargination. Follow curve.  3. Hyperlipidemia - continue statin and check lipids.  4. Fibromyalgia/anxiety - continue home regimen  5. Mildly abnormal HCG - unclear etiology but will need attention to this upon followup with primary care/OBGYN as OP given post-menopausal status.  Severity of Illness: The appropriate patient status for this patient is OBSERVATION. Observation status is judged to be reasonable and necessary in order to provide the required intensity of service to ensure the patient's safety. The patient's presenting symptoms, physical exam findings, and initial radiographic and laboratory data in the context of their medical condition is felt to place them at decreased risk for further clinical deterioration. Furthermore, it is anticipated that the patient will be medically stable for discharge from the hospital within 2 midnights of admission. The following factors support the patient status of observation.   " The patient's presenting symptoms include chest pain, dyspnea. " The physical exam findings include low-normal O2 sats, dyspnea with minimal exertion. " The initial radiographic and laboratory data are showing positive istat troponin but negative full value. CXR nonacute, but symptoms are concerning for acute process given progressive symptoms this week. BNP is pending.    For questions or updates, please contact [place] Please consult [email or web address] for contact info under Cardiology/STEMI.  Signed, Charlie Pitter, PA-C 12/06/2017, 3:02 PM   I have seen and examined the patient along with Charlie Pitter, PA-C.  I have reviewed the chart, notes  and new data.  I agree with PA/NP's note.  Key new complaints: symptoms are protean and confusing, bulk of pain complaints sounds musculoskeletal, harder to explain her dyspnea Key examination changes: tender to palpation left shoulder, left cervical area, left costochondral area; normal CV exam Key new findings / data: low risk ECG and undetectable lab-based troponin (mildly abnormal POC lab likely to be artifactual), low D-dimer.   PLAN: Ideal test would be a coronary CT angio. Increase beta blocker dose to reduce heart rate. Recheck enzymes. Nuclear perfusion study and echo can be substituted if we cannot get CT due to tachycardia or logistical issues. I do not see an indication for IV heparin at this time.  Sanda Klein, MD, [place] [phone] 12/06/2017, 5:06 PM

## 2017-12-06 NOTE — ED Notes (Signed)
Nurse will collect labs. 

## 2017-12-06 NOTE — ED Notes (Signed)
Patient transported to X-ray 

## 2017-12-06 NOTE — ED Notes (Signed)
Pt denies chest pain

## 2017-12-06 NOTE — Progress Notes (Signed)
ANTICOAGULATION CONSULT NOTE - Initial Consult  Pharmacy Consult for heparin Indication: VTE prophylaxis  Allergies  Allergen Reactions  . Erythromycin Nausea Only  . Tramadol Itching  . Dilaudid [Hydromorphone Hcl] Itching    Patient Measurements:   Heparin Dosing Weight: 75kg  Vital Signs: Temp: 97.8 F (36.6 C) (01/11 0937) Temp Source: Oral (01/11 0937) BP: 122/72 (01/11 1600) Pulse Rate: 110 (01/11 1600)  Labs: Recent Labs    12/06/17 1133 12/06/17 1426  HGB 12.3  --   HCT 40.5  --   PLT 396  --   CREATININE 0.92  --   TROPONINI  --  <0.03    Estimated Creatinine Clearance: 73.2 mL/min (by C-G formula based on SCr of 0.92 mg/dL).   Medical History: Past Medical History:  Diagnosis Date  . Anemia    previously followed by Dr. Jamse Arn for anemia and elevated platelets  . Anxiety   . C. difficile colitis 10/01/12   treated by [facility]  . Chronic fatigue syndrome   . DDD (degenerative disc disease), lumbar 08/19/14   and facet arthroplasty & left lumbar radiculopathy (Dr.Ramos)  . Depression   . Dyssynergia    dyssynergenic defecation, contributing to fecal incontinence.  . Edema   . Fibromyalgia   . Gastroparesis    followed at [facility]  . GERD (gastroesophageal reflux disease)   . Hyperlipidemia   . Kidney stone   . Lumbar radiculopathy   . Migraine   . Neuropathy   . Obstructive sleep apnea    Does not wear CPAP  . Paresthesia    Dr. Everette Rank at [facility]  . Pelvic floor dysfunction    pelvic floor dyssynergy  . Plantar fasciitis 02/2011   R foot  . Pneumonia    2012  . PONV (postoperative nausea and vomiting)    pt states has gastroparesis has difficulty taking antibiotics and narcotics has severe nausea and vomiting   . Restless leg syndrome   . S/P endometrial ablation 08/09/2006   Novasure Ablation  . S/P epidural steroid injection 09/20/14   Dr.Ramos  . Tremor    Dr. Everette Rank  . Urinary frequency   . Urinary incontinence      Medications:  Infusions:  . sodium chloride      Assessment: 59 yof presented to the ED with CP and SOB. Consulted to transition heparin from ACS r/o to VTE prophylaxis dosing. Baseline CBC is WNL and she is not on anticoagulation PTA. No bleeding noted.  Goal of Therapy:  Heparin level 0.3-0.7 units/ml Monitor platelets by anticoagulation protocol: Yes   Plan:  Transition heparin to 5000 units SQ q8h Monitor CBC and signs of bleeding  Jasmain Ahlberg 12/06/2017,4:20 PM

## 2017-12-06 NOTE — Progress Notes (Signed)
ANTICOAGULATION CONSULT NOTE - Initial Consult  Pharmacy Consult for heparin Indication: chest pain/ACS  Allergies  Allergen Reactions  . Erythromycin Nausea Only  . Tramadol Itching  . Dilaudid [Hydromorphone Hcl] Itching    Patient Measurements:   Heparin Dosing Weight: 75kg  Vital Signs: Temp: 97.8 F (36.6 C) (01/11 0937) Temp Source: Oral (01/11 0937) BP: 126/87 (01/11 1130) Pulse Rate: 113 (01/11 1130)  Labs: Recent Labs    12/06/17 1133  HGB 12.3  HCT 40.5  PLT 396    CrCl cannot be calculated (Patient's most recent lab result is older than the maximum 21 days allowed.).   Medical History: Past Medical History:  Diagnosis Date  . Anemia    previously followed by Dr. Jamse Arn for anemia and elevated platelets  . Anxiety   . C. difficile colitis 10/01/12   treated by [facility]  . Chronic fatigue syndrome   . DDD (degenerative disc disease), lumbar 08/19/14   and facet arthroplasty & left lumbar radiculopathy (Dr.Ramos)  . Depression   . Dyssynergia    dyssynergenic defecation, contributing to fecal incontinence.  . Edema   . Fibromyalgia   . Gastroparesis    follow[facility] at [facility]  . GERD (gastroesophageal reflux disease)   . Hyperlipidemia   . Kidney stone   . Lumbar radiculopathy   . Migraine   . Neuropathy   . Obstructive sleep apnea    On Cpap 2009  . Paresthesia    Dr. Everette [facility] at [facility]  . Pelvic floor dysfunction    pelvic floor dyssynergy  . Plantar fasciitis 02/2011   R foot  . Pneumonia    2012  . PONV (postoperative nausea and vomiting)    pt states has gastroparesis has difficulty taking antibiotics and narcotics has severe nausea and vomiting   . Restless leg syndrome   . S/P endometrial ablation 08/09/2006   Novasure Ablation  . S/P epidural steroid injection 09/20/14   Dr.Ramos  . Shortness of breath dyspnea    bending over; exertion   . Tremor    Dr. Everette Rank  . Urinary frequency   . Urinary incontinence      Medications:  Infusions:  . heparin      Assessment: 68 yof presented to the ED with CP and SOB. To start IV heparin for r/o ACS. Baseline CBC is WNL and she is not on anticoagulation PTA.   Goal of Therapy:  Heparin level 0.3-0.7 units/ml Monitor platelets by anticoagulation protocol: Yes   Plan:  Heparin bolus 4000 units IV x 1 Heparin gtt 1000 units/hr Check a 6 hr heparin level Daily heparin level and CBC  Rudolfo Brandow, Rande Lawman 12/06/2017,12:06 PM

## 2017-12-06 NOTE — ED Notes (Signed)
Lab called states they are adding on Troponin, BNP, and D-Dimer.

## 2017-12-07 ENCOUNTER — Other Ambulatory Visit: Payer: Self-pay

## 2017-12-07 ENCOUNTER — Observation Stay (HOSPITAL_BASED_OUTPATIENT_CLINIC_OR_DEPARTMENT_OTHER): Payer: 59

## 2017-12-07 ENCOUNTER — Encounter (HOSPITAL_COMMUNITY): Payer: Self-pay | Admitting: *Deleted

## 2017-12-07 DIAGNOSIS — R079 Chest pain, unspecified: Secondary | ICD-10-CM

## 2017-12-07 DIAGNOSIS — R0789 Other chest pain: Secondary | ICD-10-CM

## 2017-12-07 DIAGNOSIS — R0609 Other forms of dyspnea: Secondary | ICD-10-CM | POA: Diagnosis not present

## 2017-12-07 LAB — ECHOCARDIOGRAM COMPLETE
E decel time: 271 msec
E/e' ratio: 10.11
FS: 51 % — AB (ref 28–44)
Height: 62 in
IVS/LV PW RATIO, ED: 1.01
LA ID, A-P, ES: 35 mm
LA diam end sys: 35 mm
LA diam index: 1.61 cm/m2
LA vol A4C: 31.4 ml
LA vol index: 15.5 mL/m2
LA vol: 33.6 mL
LV E/e' medial: 10.11
LV E/e'average: 10.11
LV PW d: 12.6 mm — AB (ref 0.6–1.1)
LV e' LATERAL: 8.92 cm/s
LVOT SV: 61 mL
LVOT VTI: 19.5 cm
LVOT area: 3.14 cm2
LVOT diameter: 20 mm
LVOT peak vel: 109 cm/s
Lateral S' vel: 10 cm/s
MV Dec: 271
MV Peak grad: 3 mmHg
MV pk A vel: 99.4 m/s
MV pk E vel: 90.2 m/s
TAPSE: 16.1 mm
TDI e' lateral: 8.92
TDI e' medial: 9.46
Weight: 3601.43 oz

## 2017-12-07 LAB — TROPONIN I: Troponin I: <0.03 ng/mL (ref <0.03)

## 2017-12-07 LAB — CBC
HCT: 40.5 % (ref 36.0–46.0)
Hemoglobin: 12.8 g/dL (ref 12.0–15.0)
MCH: 29.5 pg (ref 26.0–34.0)
MCHC: 31.6 g/dL (ref 30.0–36.0)
MCV: 93.3 fL (ref 78.0–100.0)
Platelets: 397 10*3/uL (ref 150–400)
RBC: 4.34 MIL/uL (ref 3.87–5.11)
RDW: 16.1 % — ABNORMAL HIGH (ref 11.5–15.5)
WBC: 10.4 10*3/uL (ref 4.0–10.5)

## 2017-12-07 LAB — LIPID PANEL
Cholesterol: 119 mg/dL (ref 0–200)
HDL: 32 mg/dL — ABNORMAL LOW (ref >40)
LDL Cholesterol: 50 mg/dL (ref 0–99)
Total CHOL/HDL Ratio: 3.7 RATIO
Triglycerides: 186 mg/dL — ABNORMAL HIGH (ref <150)
VLDL: 37 mg/dL (ref 0–40)

## 2017-12-07 LAB — BASIC METABOLIC PANEL
Anion gap: 10 (ref 5–15)
BUN: 17 mg/dL (ref 6–20)
CO2: 24 mmol/L (ref 22–32)
Calcium: 8.5 mg/dL — ABNORMAL LOW (ref 8.9–10.3)
Chloride: 107 mmol/L (ref 101–111)
Creatinine, Ser: 0.87 mg/dL (ref 0.44–1.00)
GFR calc Af Amer: >60 mL/min (ref >60)
GFR calc non Af Amer: >60 mL/min (ref >60)
Glucose, Bld: 143 mg/dL — ABNORMAL HIGH (ref 65–99)
Potassium: 4.4 mmol/L (ref 3.5–5.1)
Sodium: 141 mmol/L (ref 135–145)

## 2017-12-07 LAB — HIV ANTIBODY (ROUTINE TESTING W REFLEX): HIV Screen 4th Generation wRfx: NONREACTIVE

## 2017-12-07 MED ORDER — ALBUTEROL SULFATE (2.5 MG/3ML) 0.083% IN NEBU: 2.5000 mg | INHALATION_SOLUTION | Freq: Four times a day (QID) | RESPIRATORY_TRACT | Status: DC | PRN

## 2017-12-07 MED ORDER — METHOCARBAMOL 500 MG PO TABS
500.0000 mg | ORAL_TABLET | Freq: Two times a day (BID) | ORAL | Status: DC
  Administered 2017-12-07 (×2): 500 mg via ORAL
  Filled 2017-12-07 (×3): qty 1

## 2017-12-07 MED ORDER — GI COCKTAIL ~~LOC~~
30.0000 mL | Freq: Once | ORAL | Status: AC
  Administered 2017-12-07: 30 mL via ORAL
  Filled 2017-12-07: qty 30

## 2017-12-07 MED ORDER — ZOLPIDEM TARTRATE 5 MG PO TABS
5.0000 mg | ORAL_TABLET | Freq: Once | ORAL | Status: DC
  Filled 2017-12-07: qty 1

## 2017-12-07 NOTE — Progress Notes (Signed)
  Echocardiogram 2D Echocardiogram has been performed.  Jennette Dubin 12/07/2017, 10:28 AM

## 2017-12-07 NOTE — Progress Notes (Signed)
Progress Note  Patient Name: [name] Date of Encounter: 12/07/2017  Primary Cardiologist: Nishan/Croitoru  Subjective   Minimal chest pressure, dyspnea  Inpatient Medications    Scheduled Meds: . allopurinol  300 mg Oral Daily  . ARIPiprazole  5 mg Oral Daily  . Armodafinil  250 mg Oral Q breakfast  . aspirin EC  81 mg Oral Daily  . atorvastatin  40 mg Oral Daily  . cholecalciferol  1,000 Units Oral Daily  . DULoxetine  60 mg Oral BID  . famotidine  40 mg Oral BID  . fluticasone  2 spray Each Nare Daily  . guaiFENesin  600 mg Oral BID  . heparin injection (subcutaneous)  5,000 Units Subcutaneous Q8H  . loratadine  10 mg Oral QHS  . methocarbamol  500 mg Oral BID  . metoprolol tartrate  100 mg Oral BID  . nortriptyline  75 mg Oral QHS  . pantoprazole  40 mg Oral BID  . sodium chloride flush  3 mL Intravenous Q12H  . topiramate  50 mg Oral QHS   Continuous Infusions: . sodium chloride     PRN Meds: sodium chloride, acetaminophen, albuterol, ALPRAZolam, dextromethorphan, nitroGLYCERIN, ondansetron (ZOFRAN) IV, polyvinyl alcohol, sodium chloride flush   Vital Signs    Vitals:   12/07/17 0622 12/07/17 0624 12/07/17 0626 12/07/17 0903  BP: (!) 93/51 (!) 73/57 (!) 80/50 104/65  Pulse: (!) 44 85 88 75  Resp:  15 14 20   Temp:    97.6 F (36.4 C)  TempSrc:    Oral  SpO2: 96%  100% 97%  Weight:   225 lb 1.4 oz (102.1 kg)   Height:   5\' 2"  (1.575 m)     Intake/Output Summary (Last 24 hours) at 12/07/2017 7628 Last data filed at 12/06/2017 1107 Gross per 24 hour  Intake 200 ml  Output -  Net 200 ml   Filed Weights   12/07/17 0626  Weight: 225 lb 1.4 oz (102.1 kg)    Telemetry    nsr - Personally Reviewed  ECG    none - Personally Reviewed  Physical Exam   GEN: No acute distress.   Neck: 6 cm JVD Cardiac: RRR, no murmurs, rubs, or gallops.  Respiratory: Clear to auscultation bilaterally except for basilar rales. GI: Soft, nontender,  non-distended  MS: No edema; No deformity. Neuro:  Nonfocal  Psych: Normal affect   Labs    Chemistry Recent Labs  Lab 12/06/17 1133 12/06/17 1636 12/07/17 0416  NA 140  --  141  K 3.8  --  4.4  CL 107  --  107  CO2 22  --  24  GLUCOSE 118*  --  143*  BUN 16  --  17  CREATININE 0.92  --  0.87  CALCIUM 8.4*  --  8.5*  PROT  --  6.6  --   ALBUMIN  --  3.2*  --   AST  --  25  --   ALT  --  22  --   ALKPHOS  --  107  --   BILITOT  --  0.7  --   GFRNONAA >60  --  >60  GFRAA >60  --  >60  ANIONGAP 11  --  10     Hematology Recent Labs  Lab 12/06/17 1133 12/07/17 0416  WBC 12.0* 10.4  RBC 4.40 4.34  HGB 12.3 12.8  HCT 40.5 40.5  MCV 92.0 93.3  MCH 28.0 29.5  MCHC 30.4 31.6  RDW 16.0* 16.1*  PLT 396 397    Cardiac Enzymes Recent Labs  Lab 12/06/17 1426 12/06/17 1636 12/06/17 2237 12/07/17 0416  TROPONINI <0.03 <0.03 <0.03 <0.03    Recent Labs  Lab 12/06/17 1317  TROPIPOC 0.29*     BNPNo results for input(s): BNP, PROBNP in the last 168 hours.   DDimer  Recent Labs  Lab 12/06/17 1426  DDIMER 0.29     Radiology    Dg Chest 2 View  Result Date: 12/06/2017 CLINICAL DATA:  Chest pain.  Shortness of breath. EXAM: CHEST  2 VIEW COMPARISON:  12/24/2015. FINDINGS: Mediastinum and hilar structures normal. Heart size normal. Low lung volumes with mild basilar atelectasis and/or scarring again noted. No acute abnormality. No pleural effusion or pneumothorax. IMPRESSION: Low lung volumes with mild basilar atelectasis and/or scarring again noted. Exam is stable from prior exam. No acute abnormality identified. Electronically Signed   By: Marcello Moores  Register   On: 12/06/2017 12:06    Cardiac Studies   Troponin all negative  Patient Profile     62 y.o. female admitted with chest pain. Troponin negative. Pending Coronary CTA vs stress test  Assessment & Plan    1. Chest pain - her enzymes are negative. Await coronary CT vs stress test. If negative, early  dc. 2. Sinus tachy - resolved. Continue beta blocker  For questions or updates, please contact [facility] Please consult [email or web address] for contact info under Cardiology/STEMI.      Signed, Cristopher Peru, MD  12/07/2017, 9:21 AM  Patient ID: [name], female   DOB: 1956-07-24, 62 y.o.   MRN: [id number]

## 2017-12-08 ENCOUNTER — Encounter (HOSPITAL_COMMUNITY): Payer: Self-pay | Admitting: Emergency Medicine

## 2017-12-08 ENCOUNTER — Observation Stay (HOSPITAL_COMMUNITY): Payer: 59

## 2017-12-08 ENCOUNTER — Observation Stay (HOSPITAL_BASED_OUTPATIENT_CLINIC_OR_DEPARTMENT_OTHER): Payer: 59

## 2017-12-08 ENCOUNTER — Other Ambulatory Visit: Payer: Self-pay | Admitting: Cardiology

## 2017-12-08 DIAGNOSIS — I10 Essential (primary) hypertension: Secondary | ICD-10-CM

## 2017-12-08 DIAGNOSIS — R0609 Other forms of dyspnea: Secondary | ICD-10-CM | POA: Diagnosis not present

## 2017-12-08 DIAGNOSIS — R079 Chest pain, unspecified: Secondary | ICD-10-CM | POA: Diagnosis not present

## 2017-12-08 DIAGNOSIS — R0789 Other chest pain: Secondary | ICD-10-CM | POA: Diagnosis not present

## 2017-12-08 LAB — NM MYOCAR MULTI W/SPECT W/WALL MOTION / EF
Exercise duration (min): 6 min
Exercise duration (sec): 56 s
Peak HR: 96 {beats}/min
Rest HR: 68 {beats}/min

## 2017-12-08 MED ORDER — REGADENOSON 0.4 MG/5ML IV SOLN
0.4000 mg | Freq: Once | INTRAVENOUS | Status: AC
  Administered 2017-12-08: 0.4 mg via INTRAVENOUS

## 2017-12-08 MED ORDER — PROMETHAZINE HCL 25 MG/ML IJ SOLN
12.5000 mg | Freq: Four times a day (QID) | INTRAMUSCULAR | Status: DC | PRN
  Administered 2017-12-08: 12.5 mg via INTRAVENOUS
  Filled 2017-12-08: qty 1

## 2017-12-08 MED ORDER — TECHNETIUM TC 99M TETROFOSMIN IV KIT
10.0000 | PACK | Freq: Once | INTRAVENOUS | Status: AC | PRN
  Administered 2017-12-08: 10 via INTRAVENOUS

## 2017-12-08 MED ORDER — TECHNETIUM TC 99M TETROFOSMIN IV KIT
30.0000 | PACK | Freq: Once | INTRAVENOUS | Status: AC | PRN
  Administered 2017-12-08: 30 via INTRAVENOUS

## 2017-12-08 MED ORDER — FUROSEMIDE 20 MG PO TABS: 20.0000 mg | ORAL_TABLET | Freq: Every day | ORAL | 11 refills | Status: DC

## 2017-12-08 MED ORDER — IOPAMIDOL (ISOVUE-370) INJECTION 76%
INTRAVENOUS | Status: AC
  Administered 2017-12-08: 100 mL
  Filled 2017-12-08: qty 100

## 2017-12-08 MED ORDER — METOPROLOL TARTRATE 100 MG PO TABS: 100.0000 mg | ORAL_TABLET | Freq: Two times a day (BID) | ORAL | 5 refills | Status: DC

## 2017-12-08 MED ORDER — REGADENOSON 0.4 MG/5ML IV SOLN
INTRAVENOUS | Status: AC
  Filled 2017-12-08: qty 5

## 2017-12-08 NOTE — Progress Notes (Signed)
    Patient presented for Lexiscan nuclear stress test. Tolerated procedure well. Pending final stress imaging result.  Daune Perch, AGNP-C 12/08/2017  10:00 AM Pager: [phone]

## 2017-12-08 NOTE — Progress Notes (Signed)
Patient ID: [name], female   DOB: [birth year], 62 y.o.   MRN: [id number]  Cardiology followup  Now the patient is c/o sob. Will obtain a spiral ct before DC. She has some oxygen desaturation which is likely obesity hypoventilation but will want to be sure she does not have a PE.  Mikle Bosworth.D

## 2017-12-08 NOTE — Progress Notes (Signed)
C/O Nausea with dry heaves which occurred while pt ambulated back from toilet. No other c/o expressed. Zofran 4 mg IV given.

## 2017-12-08 NOTE — Discharge Summary (Signed)
Discharge Summary    Patient ID: [name],  MRN: [id number], DOB/AGE: 61-Feb-1957 61 y.o.  Admit date: 12/06/2017 Discharge date: 12/08/2017  Primary Care Provider: Rita Ohara Primary Cardiologist: Jenkins Rouge, MD  Discharge Diagnoses    Active Problems:   Obesity   DOE (dyspnea on exertion)   Leukocytosis   Atypical chest pain   Sinus tachycardia   Chest pain   Allergies Allergies  Allergen Reactions  . Erythromycin Nausea Only  . Tramadol Itching  . Dilaudid [Hydromorphone Hcl] Itching    Diagnostic Studies/Procedures    Echocardiogram 12/07/2017 Study Conclusions - Left ventricle: The cavity size was normal. Wall thickness was   increased in a pattern of mild LVH. Systolic function was normal.   The estimated ejection fraction was in the range of 60% to 65%.   Although no diagnostic regional wall motion abnormality was   identified, this possibility cannot be completely excluded on the   basis of this study. Doppler parameters are consistent with   abnormal left ventricular relaxation (grade 1 diastolic   dysfunction). - Aortic valve: There was no stenosis. - Mitral valve: There was no significant regurgitation. - Right ventricle: The cavity size was normal. Systolic function   was normal. - Pulmonary arteries: No complete TR doppler jet so unable to   estimate PA systolic pressure. - Inferior vena cava: The vessel was normal in size. The   respirophasic diameter changes were in the normal range (>= 50%),   consistent with normal central venous pressure. - Pericardium, extracardiac: A trivial pericardial effusion was   identified posterior to the heart.  Impressions: - Normal LV size with mild LV hypertrophy. EF 60-65%. Normal RV   size and systolic function. No significant valvular   abnormalities. _____________  [name] 12/08/2017  There was no ST segment deviation noted during stress.  No T wave inversion was noted during  stress.  Defect 1: There is a small defect of severe severity present in the apical anterior, apical septal and apex location.  This is a low risk study.  The left ventricular ejection fraction is normal (55-65%).  Nuclear stress EF: 63%.   Low risk, probably normal stress nuclear study with breast attenuation but no ischemia; EF 63 with normal wall motion.   History of Present Illness     [name] is a 62 y.o. female with history of Fibromyalgia, gastroparesis, anxiety, acid reflux, chronic fatigue, neuropathy, hyperlipidemia, migraines, OSA (has not tolerated CPAP), RLS, GERD whom we are asked to see for chest pain, shortness of breath and abnormal troponin.  She remotely saw Dr. Johnsie Cancel for pre-op clearance at which time echo 07/2015 showed mild LVH, EF 60-65%, grade 1 DD. Nuclear stress test 07/2015 was normal. HR was noted to be around 100 at that time at baseline.She recently had a URI the week before Christmas treated symptomatically. She initially got better, but then a week ago began coughing more (clear sputum only) so saw PCP on Monday 12/02/17. At that time lungs were felt to be clear and she was advised to continue Mucinex and Delsym and follow up closely if any progression. Yesterday she was caring for 36 month old grandson and noticed significant dyspnea whenever she exerted herself as well as a sensation of dizziness and dyspnea anytime she went to bend over and stand back up. She also began noticing intermittent chest pain which she finds hard to quantify, occurring at random times through the day but also  with exertion; she did have one episode yesterday evening while doing nothing at all. No recent LEE, orthopnea, long surgery, travel, bedrest. She also has noticed a discomfort in her left upper shoulder blade and neck today. Today she went back to primary care and at that time became dizzy while signing in so she was urgently tended to. She was afebrile per their vitals, but  patient reports she drooped her sats into the 80s and HR was in the 115-125 range. She was dry heaving, pale and diaphoretic at visit so EMS was called. She got 1 SL NTG per triage reports but due to flurry of activity, the patient doesn't recall whether this helped. It did give her a headache. She received 324mg  ASA. Although not SOB at rest, she reports anytime she has to go to sit up, she feels quite dyspneic. She also still feels a general mild sense of tightness across her chest. She was started on heparin and NTG gtt.   She is a nonsmoker, no family history of CAD (+fam hx of leaky valve in mother, PPM in father).   Hospital Course     Consultants: none  Initial POC troponin was 0.29, however, the next 4 troponins were all <0.03. An echocardiogram showed normal LV function, no obvious wall motion abnormality (although not optimal study), and grade 1 diastolic dysfunction.  She underwent a lexiscan myoview today that was low risk. In evaluation for her shortness of breath a CTA of the chest was done that showed no PE or other findngs to explain her symptoms. She was tachycardic so her metropolol was increased to 100 mg BID. Heart rates are now in the 70's, BP is well controlled.   Her presentation is likely representing some mild diastolic heart failure. She will be started on lasix 20 mg daily. I have discussed lifestyle changes with her including heart healthy, low sodium diet, keeping blood pressure under control, low intensity exercise such as walking- to start very slowly and build up. She and her husband are present and verbalize understanding.   Her LDL is 50 which is at goal. Continue current statin.   Patient has been seen by Dr. Lovena Le today and deemed ready for discharge home. All follow up appointments have been scheduled. Discharge medications are listed below. _____________  Discharge Vitals Blood pressure 106/66, pulse 79, temperature 97.7 F (36.5 C), temperature source Oral,  resp. rate 16, height 5\' 2"  (1.575 m), weight 226 lb (102.5 kg), last menstrual period 07/27/2006, SpO2 96 %.  Filed Weights   12/07/17 0626 12/08/17 0436  Weight: 225 lb 1.4 oz (102.1 kg) 226 lb (102.5 kg)    Labs & Radiologic Studies    CBC Recent Labs    12/06/17 1133 12/07/17 0416  WBC 12.0* 10.4  HGB 12.3 12.8  HCT 40.5 40.5  MCV 92.0 93.3  PLT 396 270   Basic Metabolic Panel Recent Labs    12/06/17 1133 12/07/17 0416  NA 140 141  K 3.8 4.4  CL 107 107  CO2 22 24  GLUCOSE 118* 143*  BUN 16 17  CREATININE 0.92 0.87  CALCIUM 8.4* 8.5*   Liver Function Tests Recent Labs    12/06/17 1636  AST 25  ALT 22  ALKPHOS 107  BILITOT 0.7  PROT 6.6  ALBUMIN 3.2*   No results for input(s): LIPASE, AMYLASE in the last 72 hours. Cardiac Enzymes Recent Labs    12/06/17 1636 12/06/17 2237 12/07/17 0416  TROPONINI <0.03 <0.03 <0.03  BNP Invalid input(s): POCBNP D-Dimer Recent Labs    12/06/17 1426  DDIMER 0.29   Hemoglobin A1C No results for input(s): HGBA1C in the last 72 hours. Fasting Lipid Panel Recent Labs    12/07/17 0500  CHOL 119  HDL 32*  LDLCALC 50  TRIG 186*  CHOLHDL 3.7   Thyroid Function Tests Recent Labs    12/06/17 1636  TSH 1.632   _____________  Dg Chest 2 View  Result Date: 12/06/2017 CLINICAL DATA:  Chest pain.  Shortness of breath. EXAM: CHEST  2 VIEW COMPARISON:  12/24/2015. FINDINGS: Mediastinum and hilar structures normal. Heart size normal. Low lung volumes with mild basilar atelectasis and/or scarring again noted. No acute abnormality. No pleural effusion or pneumothorax. IMPRESSION: Low lung volumes with mild basilar atelectasis and/or scarring again noted. Exam is stable from prior exam. No acute abnormality identified. Electronically Signed   By: Marcello Moores  Register   On: 12/06/2017 12:06   Ct Angio Chest Pe W Or Wo Contrast  Result Date: 12/08/2017 CLINICAL DATA:  Chest pain and shortness of breath. EXAM: CT ANGIOGRAPHY  CHEST WITH CONTRAST TECHNIQUE: Multidetector CT imaging of the chest was performed using the standard protocol during bolus administration of intravenous contrast. Multiplanar CT image reconstructions and MIPs were obtained to evaluate the vascular anatomy. CONTRAST:  139mL ISOVUE-370 IOPAMIDOL (ISOVUE-370) INJECTION 76% COMPARISON:  Chest radiograph of 12/06/2016.  CT of 11/26/2016. FINDINGS: Cardiovascular: The quality of this exam for evaluation of pulmonary embolism is moderate. Limitations include bolus timing and patient body habitus. The contrast is centered in the SVC. No pulmonary embolism to the large segmental level. Normal aortic caliber without dissection. Mild cardiomegaly, without pericardial effusion. Mediastinum/Nodes: No mediastinal or hilar adenopathy. Lungs/Pleura: No pleural fluid.  Mild motion degradation throughout. Mosaic attenuation, favored to be related to hypoventilation and air trapping in this obese patient. Upper Abdomen: Cholecystectomy. Normal imaged portions of the liver, spleen, stomach, pancreas. Musculoskeletal: No acute osseous abnormality. Remote sternal fracture. Review of the MIP images confirms the above findings. IMPRESSION: 1. Multifactorial degradation. No large pulmonary embolism, as detailed above. 2. No other explanation for patient's symptoms. Electronically Signed   By: Abigail Miyamoto M.D.   On: 12/08/2017 15:16   Nm Myocar Multi W/spect W/wall Motion / Ef  Result Date: 12/08/2017  There was no ST segment deviation noted during stress.  No T wave inversion was noted during stress.  Defect 1: There is a small defect of severe severity present in the apical anterior, apical septal and apex location.  This is a low risk study.  The left ventricular ejection fraction is normal (55-65%).  Nuclear stress EF: 63%.  Low risk, probably normal stress nuclear study with breast attenuation but no ischemia; EF 63 with normal wall motion.   Disposition   Pt is being  discharged home today in good condition.  Follow-up Plans & Appointments    Follow-up Information    Josue Hector, MD Follow up.   Specialty:  Cardiology Why:  The office will call you to schedule a cardiology hospital follow up.  You will also need labs checked in a week. They will call you to schedule.  Contact information: [address] [address] [phone]          Discharge Instructions    Diet - low sodium heart healthy   Complete by:  As directed    Increase activity slowly   Complete by:  As directed  Discharge Medications   Allergies as of 12/08/2017      Reactions   Erythromycin Nausea Only   Tramadol Itching   Dilaudid [hydromorphone Hcl] Itching      Medication List    TAKE these medications   albuterol 108 (90 Base) MCG/ACT inhaler Commonly known as:  PROVENTIL HFA;VENTOLIN HFA Inhale 1-2 puffs into the lungs every 6 (six) hours as needed for wheezing or shortness of breath. Reported on 05/30/2016   allopurinol 300 MG tablet Commonly known as:  ZYLOPRIM Take 1 tablet (300 mg total) daily by mouth.   ALPRAZolam 0.25 MG tablet Commonly known as:  XANAX Take 0.25 mg by mouth 3 (three) times daily as needed for anxiety.   ARIPiprazole 5 MG tablet Commonly known as:  ABILIFY Take 5 mg by mouth daily.   Armodafinil 250 MG tablet TAKE 1 TABLET BY MOUTH WITH BREAKFAST What changed:  See the new instructions.   atorvastatin 40 MG tablet Commonly known as:  LIPITOR Take 1 tablet (40 mg total) by mouth daily.   cetirizine 10 MG tablet Commonly known as:  ZYRTEC Take 10 mg by mouth at bedtime.   cholecalciferol 1000 units tablet Commonly known as:  VITAMIN D Take 1,000 Units by mouth daily.   colchicine 0.6 MG tablet Take 2 tablets by mouth at onset of gout flare. Taken an additional tablet 1 hour later, if needed. May take like this every 3d, if needed   DELSYM 30 MG/5ML liquid Generic drug:   dextromethorphan Take 30 mg by mouth 2 (two) times daily.   DULoxetine 60 MG capsule Commonly known as:  CYMBALTA Take 60 mg by mouth 2 (two) times daily.   fluticasone 50 MCG/ACT nasal spray Commonly known as:  FLONASE PLACE 2 SPRAYS INTO BOTH NOSTRILS DAILY.   furosemide 20 MG tablet Commonly known as:  LASIX Take 1 tablet (20 mg total) by mouth daily.   gabapentin 300 MG capsule Commonly known as:  NEURONTIN Take 1 capsule (300 mg total) by mouth at bedtime.   guaiFENesin 600 MG 12 hr tablet Commonly known as:  MUCINEX Take 600 mg by mouth 2 (two) times daily.   methocarbamol 500 MG tablet Commonly known as:  ROBAXIN TAKE 1 TABLET AT 7AM AND AT 2PM What changed:  See the new instructions.   metoprolol tartrate 100 MG tablet Commonly known as:  LOPRESSOR Take 1 tablet (100 mg total) by mouth 2 (two) times daily. What changed:    medication strength  how much to take  Another medication with the same name was removed. Continue taking this medication, and follow the directions you see here.   nortriptyline 25 MG capsule Commonly known as:  PAMELOR Take 75-100 mg by mouth at bedtime.   pantoprazole 40 MG tablet Commonly known as:  PROTONIX Take 40 mg by mouth 2 (two) times daily.   PHILLIPS COLON HEALTH Caps Take 1 capsule by mouth daily. Reported on 01/02/2016   PRESERVISION AREDS PO Take 1 capsule by mouth 2 (two) times daily. Reported on 01/02/2016   prochlorperazine 10 MG tablet Commonly known as:  COMPAZINE Take 10 mg by mouth every 6 (six) hours as needed for nausea or vomiting. Reported on 05/09/2016   promethazine 25 MG suppository Commonly known as:  PHENERGAN Place 25 mg rectally every 6 (six) hours as needed for nausea or vomiting. Reported on 05/09/2016   ranitidine 300 MG tablet Commonly known as:  ZANTAC Take 300 mg by mouth 2 (two) times daily.  SYSTANE COMPLETE 0.6 % Soln Generic drug:  Propylene Glycol Place 1 drop into both eyes 4 (four)  times daily.   topiramate 25 MG tablet Commonly known as:  TOPAMAX Take 2 tablets (50 mg total) by mouth at bedtime.          Outstanding Labs/Studies   BMP in 1 week. Message sent to [facility] office.   Duration of Discharge Encounter   Greater than 30 minutes including physician time.  Signed, Daune Perch NP 12/08/2017, 5:42 PM  EP Attending  Patient seen and examined. Agree with above. She is stable for DC with followup as noted above.  Mikle Bosworth.D.

## 2017-12-08 NOTE — Progress Notes (Signed)
Progress Note  Patient Name: [name] Date of Encounter: 12/08/2017  Primary Cardiologist: Jenkins Rouge, MD   Subjective   Pt was nauseated, got phenergan and feels better. Still having mild chest pressure going to left shoulder, constant.   Inpatient Medications    Scheduled Meds: . allopurinol  300 mg Oral Daily  . ARIPiprazole  5 mg Oral Daily  . Armodafinil  250 mg Oral Q breakfast  . aspirin EC  81 mg Oral Daily  . atorvastatin  40 mg Oral Daily  . cholecalciferol  1,000 Units Oral Daily  . DULoxetine  60 mg Oral BID  . famotidine  40 mg Oral BID  . fluticasone  2 spray Each Nare Daily  . guaiFENesin  600 mg Oral BID  . heparin injection (subcutaneous)  5,000 Units Subcutaneous Q8H  . loratadine  10 mg Oral QHS  . methocarbamol  500 mg Oral BID  . metoprolol tartrate  100 mg Oral BID  . nortriptyline  75 mg Oral QHS  . pantoprazole  40 mg Oral BID  . sodium chloride flush  3 mL Intravenous Q12H  . topiramate  50 mg Oral QHS  . zolpidem  5 mg Oral Once   Continuous Infusions: . sodium chloride     PRN Meds: sodium chloride, acetaminophen, albuterol, ALPRAZolam, dextromethorphan, nitroGLYCERIN, ondansetron (ZOFRAN) IV, polyvinyl alcohol, promethazine, sodium chloride flush   Vital Signs    Vitals:   12/07/17 2142 12/08/17 0001 12/08/17 0015 12/08/17 0436  BP: 107/64 109/68  (!) 105/59  Pulse: 75 70  70  Resp:  17  15  Temp:  97.8 F (36.6 C)  97.9 F (36.6 C)  TempSrc:  Oral  Oral  SpO2:  97% 96% 96%  Weight:    226 lb (102.5 kg)  Height:        Intake/Output Summary (Last 24 hours) at 12/08/2017 0802 Last data filed at 12/08/2017 0443 Gross per 24 hour  Intake 1700 ml  Output 650 ml  Net 1050 ml   Filed Weights   12/07/17 0626 12/08/17 0436  Weight: 225 lb 1.4 oz (102.1 kg) 226 lb (102.5 kg)    Telemetry    Sinus rhythm 60's-70's - Personally Reviewed  ECG    No new tracings - Personally Reviewed  Physical Exam   GEN: Obese  female, No acute distress.   Neck: No JVD Cardiac: RRR, no murmurs, rubs, or gallops.  Respiratory: Clear to auscultation bilaterally. GI: Soft, nontender, non-distended  MS: No edema; No deformity. Neuro:  Nonfocal  Psych: Normal affect   Labs    Chemistry Recent Labs  Lab 12/06/17 1133 12/06/17 1636 12/07/17 0416  NA 140  --  141  K 3.8  --  4.4  CL 107  --  107  CO2 22  --  24  GLUCOSE 118*  --  143*  BUN 16  --  17  CREATININE 0.92  --  0.87  CALCIUM 8.4*  --  8.5*  PROT  --  6.6  --   ALBUMIN  --  3.2*  --   AST  --  25  --   ALT  --  22  --   ALKPHOS  --  107  --   BILITOT  --  0.7  --   GFRNONAA >60  --  >60  GFRAA >60  --  >60  ANIONGAP 11  --  10     Hematology Recent Labs  Lab 12/06/17 1133  12/07/17 0416  WBC 12.0* 10.4  RBC 4.40 4.34  HGB 12.3 12.8  HCT 40.5 40.5  MCV 92.0 93.3  MCH 28.0 29.5  MCHC 30.4 31.6  RDW 16.0* 16.1*  PLT 396 397    Cardiac Enzymes Recent Labs  Lab 12/06/17 1426 12/06/17 1636 12/06/17 2237 12/07/17 0416  TROPONINI <0.03 <0.03 <0.03 <0.03    Recent Labs  Lab 12/06/17 1317  TROPIPOC 0.29*     BNPNo results for input(s): BNP, PROBNP in the last 168 hours.   DDimer  Recent Labs  Lab 12/06/17 1426  DDIMER 0.29     Radiology    Dg Chest 2 View  Result Date: 12/06/2017 CLINICAL DATA:  Chest pain.  Shortness of breath. EXAM: CHEST  2 VIEW COMPARISON:  12/24/2015. FINDINGS: Mediastinum and hilar structures normal. Heart size normal. Low lung volumes with mild basilar atelectasis and/or scarring again noted. No acute abnormality. No pleural effusion or pneumothorax. IMPRESSION: Low lung volumes with mild basilar atelectasis and/or scarring again noted. Exam is stable from prior exam. No acute abnormality identified. Electronically Signed   By: Marcello Moores  Register   On: 12/06/2017 12:06    Cardiac Studies   Echocardiogram 12/07/2017 Study Conclusions - Left ventricle: The cavity size was normal. Wall thickness  was   increased in a pattern of mild LVH. Systolic function was normal.   The estimated ejection fraction was in the range of 60% to 65%.   Although no diagnostic regional wall motion abnormality was   identified, this possibility cannot be completely excluded on the   basis of this study. Doppler parameters are consistent with   abnormal left ventricular relaxation (grade 1 diastolic   dysfunction). - Aortic valve: There was no stenosis. - Mitral valve: There was no significant regurgitation. - Right ventricle: The cavity size was normal. Systolic function   was normal. - Pulmonary arteries: No complete TR doppler jet so unable to   estimate PA systolic pressure. - Inferior vena cava: The vessel was normal in size. The   respirophasic diameter changes were in the normal range (>= 50%),   consistent with normal central venous pressure. - Pericardium, extracardiac: A trivial pericardial effusion was   identified posterior to the heart.  Impressions: - Normal LV size with mild LV hypertrophy. EF 60-65%. Normal RV   size and systolic function. No significant valvular   abnormalities.  Patient Profile     62 y.o. female with history of Fibromyalgia, gastroparesis, anxiety, acid reflux, chronic fatigue, neuropathy, hyperlipidemia, migraines, OSA (has not tolerated CPAP), RLS, GERD who presented with chest pain.   Assessment & Plan    1. Chest pain: Initial POC troponin was 0.29, however, the next 4 troponins were all <0.03. Awaiting stress test today. (could not do CCTA due to lack of adequate IV access) 2. Sinus tachycardia: metoprolol has been increased to 100 mg BID and heart rate is better.  3. Hyperlipidemia: LDL 50 which is at goal <70, continue statin.   For questions or updates, please contact [place] Please consult [email or web address] for contact info under Cardiology/STEMI.      Signed, Daune Perch, NP  12/08/2017, 8:02 AM    Cardiology Attending  Patient seen  and examined. Agree with above. She could not get a CTA of coronaries yesterday because an 18 gauge IV could not be placed. She is pending lexiscan myoview this morning and can be discharged if low risk. Continue current meds.   Mikle Bosworth.D.

## 2017-12-08 NOTE — Plan of Care (Signed)
Completed Stress Test and CT chest

## 2017-12-18 ENCOUNTER — Encounter: Payer: Self-pay | Admitting: Cardiology

## 2017-12-18 ENCOUNTER — Other Ambulatory Visit: Payer: 59 | Admitting: *Deleted

## 2017-12-18 ENCOUNTER — Ambulatory Visit (INDEPENDENT_AMBULATORY_CARE_PROVIDER_SITE_OTHER): Payer: 59 | Admitting: Cardiology

## 2017-12-18 ENCOUNTER — Encounter (INDEPENDENT_AMBULATORY_CARE_PROVIDER_SITE_OTHER): Payer: Self-pay

## 2017-12-18 VITALS — BP 96/80 | HR 69 | Ht 62.0 in | Wt 227.1 lb

## 2017-12-18 DIAGNOSIS — R0602 Shortness of breath: Secondary | ICD-10-CM

## 2017-12-18 DIAGNOSIS — R000 Tachycardia, unspecified: Secondary | ICD-10-CM

## 2017-12-18 DIAGNOSIS — I5030 Unspecified diastolic (congestive) heart failure: Secondary | ICD-10-CM

## 2017-12-18 DIAGNOSIS — R0789 Other chest pain: Secondary | ICD-10-CM

## 2017-12-18 DIAGNOSIS — I10 Essential (primary) hypertension: Secondary | ICD-10-CM

## 2017-12-18 LAB — BASIC METABOLIC PANEL
BUN/Creatinine Ratio: 29 — ABNORMAL HIGH (ref 12–28)
BUN: 23 mg/dL (ref 8–27)
CO2: 25 mmol/L (ref 20–29)
Calcium: 9.1 mg/dL (ref 8.7–10.3)
Chloride: 102 mmol/L (ref 96–106)
Creatinine, Ser: 0.79 mg/dL (ref 0.57–1.00)
GFR calc Af Amer: 94 mL/min/{1.73_m2} (ref >59)
GFR calc non Af Amer: 82 mL/min/{1.73_m2} (ref >59)
Glucose: 128 mg/dL — ABNORMAL HIGH (ref 65–99)
Potassium: 4.2 mmol/L (ref 3.5–5.2)
Sodium: 142 mmol/L (ref 134–144)

## 2017-12-18 LAB — PRO B NATRIURETIC PEPTIDE: NT-Pro BNP: 28 pg/mL (ref 0–287)

## 2017-12-18 MED ORDER — METOPROLOL TARTRATE 50 MG PO TABS: 50.0000 mg | ORAL_TABLET | Freq: Two times a day (BID) | ORAL | 3 refills | Status: DC

## 2017-12-18 NOTE — Progress Notes (Signed)
Cardiology Office Note   Date:  12/18/2017   ID:  [name], DOB Mar 30, 1956, MRN [id number]  PCP:  Leah Ohara, MD  Cardiologist:  Dr. Johnsie Cancel     Chief Complaint  Patient presents with  . Hospitalization Follow-up    CP      History of Present Illness: [name] is a 62 y.o. female who presents for post hospitalization with hx of Fibromyalgia, gastroparesis, anxiety, acid reflux, chronic fatigue, neuropathy, hyperlipidemia, migraines, OSA (has not tolerated CPAP), RLS, GERD.  She was admitted 12/06/17 with chest pain.  Troponin was elevated 0.29 though all Troponin I was neg <0.03 X 3.  Admitted on IV heparin and ASA .  She had neg CTA for PE and nuc study was low risk with EF 63%.   She had ST and meds adjusted, her LDL was great at 50, continued statin.   Today she is exhausted and has not felt well since discharge.  She continues with DOE.  Today she took both 100 mg of lopressor this AM, so concerned.  Some drop in BP but not dizzy.  Here her HR is in 15s.  The SOB has never improved.    Past Medical History:  Diagnosis Date  . Anemia    previously followed by Dr. Jamse [name] for anemia and elevated platelets  . Anxiety   . C. difficile colitis 10/01/12   treated by [name]  . Chronic fatigue syndrome   . DDD (degenerative disc disease), lumbar 08/19/14   and facet arthroplasty & left lumbar radiculopathy ([facility])  . Depression   . Dyssynergia    dyssynergenic defecation, contributing to fecal incontinence.  . Edema   . Fibromyalgia   . Gastroparesis    followed at [name]  . GERD (gastroesophageal reflux disease)   . Hyperlipidemia   . Kidney stone   . Lumbar radiculopathy   . Migraine   . Neuropathy   . Obstructive sleep apnea    Does not wear CPAP  . Paresthesia    [name] at [name]  . Pelvic floor dysfunction    pelvic floor dyssynergy  . Plantar fasciitis 02/2011   R foot  . Pneumonia    2012  . PONV (postoperative nausea and vomiting)      pt states has gastroparesis has difficulty taking antibiotics and narcotics has severe nausea and vomiting   . Restless leg syndrome   . S/P endometrial ablation 08/09/2006   Novasure Ablation  . S/P epidural steroid injection 09/20/14   [facility]  . Tremor    [name]  . Urinary frequency   . Urinary incontinence     Past Surgical History:  Procedure Laterality Date  . CHOLECYSTECTOMY  9/05  . ENDOMETRIAL ABLATION  08/09/2006   [name] Ablation  . FACET JOINT INJECTION  04/17/2017   Left L4-5 and L5-S1  . KNEE ARTHROPLASTY    . KNEE SURGERY  1999   R knee, Dr. Eddie [name], torn cartilage  . TONSILLECTOMY  1968  . TONSILLECTOMY    . TOTAL KNEE ARTHROPLASTY Right 09/05/2015   Procedure: RIGHT TOTAL KNEE ARTHROPLASTY;  Surgeon: Leah Arabian, MD;  Location: [facility];  Service: Orthopedics;  Laterality: Right;     Current Outpatient Medications  Medication Sig Dispense Refill  . albuterol (PROVENTIL HFA;VENTOLIN HFA) 108 (90 Base) MCG/ACT inhaler Inhale 1-2 puffs into the lungs every 6 (six) hours as needed for wheezing or shortness of breath. Reported on 05/30/2016 1 Inhaler 3  .  allopurinol (ZYLOPRIM) 300 MG tablet Take 1 tablet (300 mg total) daily by mouth. 90 tablet 0  . ALPRAZolam (XANAX) 0.25 MG tablet Take 0.25 mg by mouth 3 (three) times daily as needed for anxiety.     . ARIPiprazole (ABILIFY) 5 MG tablet Take 5 mg by mouth daily.  5  . Armodafinil 250 MG tablet TAKE 1 TABLET BY MOUTH WITH BREAKFAST (Patient taking differently: TAKE 250MG  BY MOUTH ONCE DAILY WITH BREAKFAST) 90 tablet 0  . atorvastatin (LIPITOR) 40 MG tablet Take 1 tablet (40 mg total) by mouth daily. 90 tablet 0  . cetirizine (ZYRTEC) 10 MG tablet Take 10 mg by mouth at bedtime.    . cholecalciferol (VITAMIN D) 1000 units tablet Take 1,000 Units by mouth daily.    . colchicine 0.6 MG tablet Take 2 tablets by mouth at onset of gout flare. Taken an additional tablet 1 hour later, if needed. May take  like this every 3d, if needed 30 tablet 0  . DULoxetine (CYMBALTA) 60 MG capsule Take 60 mg by mouth 2 (two) times daily.     . fluticasone (FLONASE) 50 MCG/ACT nasal spray PLACE 2 SPRAYS INTO BOTH NOSTRILS DAILY. 16 g 0  . furosemide (LASIX) 20 MG tablet Take 1 tablet (20 mg total) by mouth daily. 30 tablet 11  . metoprolol tartrate (LOPRESSOR) 100 MG tablet Take 1 tablet (100 mg total) by mouth 2 (two) times daily. 60 tablet 5  . Multiple Vitamins-Minerals (PRESERVISION AREDS PO) Take 1 capsule by mouth 2 (two) times daily. Reported on 01/02/2016    . nortriptyline (PAMELOR) 25 MG capsule Take 75-100 mg by mouth at bedtime.    . pantoprazole (PROTONIX) 40 MG tablet Take 40 mg by mouth 2 (two) times daily.    . Probiotic Product (Rafael Capo) CAPS Take 1 capsule by mouth daily. Reported on 01/02/2016    . prochlorperazine (COMPAZINE) 10 MG tablet Take 10 mg by mouth every 6 (six) hours as needed for nausea or vomiting. Reported on 05/09/2016    . promethazine (PHENERGAN) 25 MG suppository Place 25 mg rectally every 6 (six) hours as needed for nausea or vomiting. Reported on 05/09/2016    . Propylene Glycol (SYSTANE COMPLETE) 0.6 % SOLN Place 1 drop into both eyes 4 (four) times daily.    . ranitidine (ZANTAC) 300 MG tablet Take 300 mg by mouth 2 (two) times daily.     Marland Kitchen topiramate (TOPAMAX) 25 MG tablet Take 2 tablets (50 mg total) by mouth at bedtime. 180 tablet 0   No current facility-administered medications for this visit.     Allergies:   Erythromycin; Tramadol; and Dilaudid [hydromorphone hcl]    Social History:  The patient  reports that  has never smoked. she has never used smokeless tobacco. She reports that she does not drink alcohol or use drugs.   Family History:  The patient's family history includes Allergies in her mother and sister; Asthma in her sister; Cancer in her maternal aunt and maternal aunt; Colon cancer in her maternal aunt; Diabetes in her father; Heart disease  in her father, mother, paternal grandfather, and paternal grandmother; Hypertension in her father and mother; Irritable bowel syndrome in her sister; Kidney disease in her father; Macular degeneration in her mother; Stroke (age of onset: 66) in her father.    ROS:  General:no colds or fevers, no weight changes Skin:no rashes or ulcers HEENT:no blurred vision, no congestion CV:see HPI PUL:see HPI [name]:no diarrhea constipation or melena,  no indigestion GU:no hematuria, no dysuria MS:no joint pain, no claudication Neuro:no syncope, no lightheadedness Endo:no diabetes, no thyroid disease  Wt Readings from Last 3 Encounters:  12/18/17 227 lb 1.9 oz (103 kg)  12/08/17 226 lb (102.5 kg)  12/02/17 227 lb (103 kg)     PHYSICAL EXAM: VS:  BP 96/80   Pulse 69   Ht 5\' 2"  (1.575 m)   Wt 227 lb 1.9 oz (103 kg)   LMP 07/27/2006   SpO2 98%   BMI 41.54 kg/m  , BMI Body mass index is 41.54 kg/m. General:Pleasant affect, NAD Skin:Warm and dry, brisk capillary refill HEENT:normocephalic, sclera clear, mucus membranes moist Neck:supple, no JVD, no bruits  Heart:S1S2 RRR without murmur, gallup, rub or click Lungs:clear without rales, rhonchi, or wheezes XQJ:JHER, non tender, + BS, do not palpate liver spleen or masses Ext:no lower ext edema, 2+ pedal pulses, 2+ radial pulses Neuro:alert and oriented X 3, MAE, follows commands, + facial symmetry    EKG:  EKG is ordered today. The ekg ordered today demonstrates  SR at 67 no changes from previous.    Recent Labs: 12/06/2017: ALT 22; TSH 1.632 12/07/2017: BUN 17; Creatinine, Ser 0.87; Hemoglobin 12.8; Platelets 397; Potassium 4.4; Sodium 141    Lipid Panel    Component Value Date/Time   CHOL 119 12/07/2017 0500   TRIG 186 (H) 12/07/2017 0500   HDL 32 (L) 12/07/2017 0500   CHOLHDL 3.7 12/07/2017 0500   VLDL 37 12/07/2017 0500   LDLCALC 50 12/07/2017 0500       Other studies Reviewed: Additional studies/ records that were reviewed  today include:  ECHO 12/07/17. Study Conclusions  - Left ventricle: The cavity size was normal. Wall thickness was   increased in a pattern of mild LVH. Systolic function was normal.   The estimated ejection fraction was in the range of 60% to 65%.   Although no diagnostic regional wall motion abnormality was   identified, this possibility cannot be completely excluded on the   basis of this study. Doppler parameters are consistent with   abnormal left ventricular relaxation (grade 1 diastolic   dysfunction). - Aortic valve: There was no stenosis. - Mitral valve: There was no significant regurgitation. - Right ventricle: The cavity size was normal. Systolic function   was normal. - Pulmonary arteries: No complete TR doppler jet so unable to   estimate PA systolic pressure. - Inferior vena cava: The vessel was normal in size. The   respirophasic diameter changes were in the normal range (>= 50%),   consistent with normal central venous pressure. - Pericardium, extracardiac: A trivial pericardial effusion was   identified posterior to the heart.  Impressions:  - Normal LV size with mild LV hypertrophy. EF 60-65%. Normal RV   size and systolic function. No significant valvular   abnormalities.  NUC study 12/08/17  There was no ST segment deviation noted during stress.  No T wave inversion was noted during stress.  Defect 1: There is a small defect of severe severity present in the apical anterior, apical septal and apex location.  This is a low risk study.  The left ventricular ejection fraction is normal (55-65%).  Nuclear stress EF: 63%.   Low risk, probably normal stress nuclear study with breast attenuation but no ischemia; EF 63 with normal wall motion.  ASSESSMENT AND PLAN:  1. Chest pain has resolved and neg nuc study  2. Pt still DOE will plan PFTs with bronchodilator.  3. Will decrease loprssor to 50 BID, BB seems to have increased fatigue and increased  DOE.  Will see her back in 1 week with lower dose BB to eval.  No further lopressor today as she has taken 200 mg already.  She will hold her lasix as well for today.  Will need to check HR on next visit, elevated in the [name].    4.  Possible due to diastolic HF but euvolemic now will check BNP.    Her husband drove her today.   Current medicines are reviewed with the patient today.  The patient Has no concerns regarding medicines.  The following changes have been made:  See above Labs/ tests ordered today include:see above  Disposition:   FU:  see above  Signed, Cecilie Kicks, NP  12/18/2017 9:18 AM    [address] [place], [place], [place], [place] Phone: [phone]; Fax: [phone]

## 2017-12-18 NOTE — Patient Instructions (Signed)
Medication Instructions:  1. DECREASE METOPROLOL TO 50 MG BY MOUTH TWICE DAILY. A NEW PRESCRIPTION HAS BEEN SENT IN TO YOUR PHARMACY.   DO NOT TAKE ANY MORE METOPROLOL TODAY. START YOUR NEW DOSE TOMORROW.  Labwork: TODAY: BMET, PRO BNP  Testing/Procedures: Your physician has recommended that you have a pulmonary function test. Pulmonary Function Tests are a group of tests that measure how well air moves in and out of your lungs.    Follow-Up: Your physician recommends that you schedule a follow-up appointment in 1 WEEK    Any Other Special Instructions Will Be Listed Below (If Applicable).  DO NOT DRIVE TODAY BECAUSE OF THE AMOUNT OF METOPROLOL YOU HAVE TAKEN.    If you need a refill on your cardiac medications before your next appointment, please call your pharmacy.

## 2017-12-23 ENCOUNTER — Ambulatory Visit (INDEPENDENT_AMBULATORY_CARE_PROVIDER_SITE_OTHER): Payer: 59 | Admitting: Internal Medicine

## 2017-12-23 DIAGNOSIS — R0602 Shortness of breath: Secondary | ICD-10-CM

## 2017-12-23 LAB — PULMONARY FUNCTION TEST
DL/VA % pred: 107 %
DL/VA: 5.09 ml/min/mmHg/L
DLCO cor % pred: 79 %
DLCO cor: 18.79 ml/min/mmHg
DLCO unc % pred: 81 %
DLCO unc: 19.13 ml/min/mmHg
FEF 25-75 Post: 1.77 L/sec
FEF 25-75 Pre: 2.01 L/sec
FEF2575-%Change-Post: -12 %
FEF2575-%Pred-Post: 77 %
FEF2575-%Pred-Pre: 87 %
FEV1-%Change-Post: -2 %
FEV1-%Pred-Post: 77 %
FEV1-%Pred-Pre: 80 %
FEV1-Post: 1.94 L
FEV1-Pre: 1.99 L
FEV1FVC-%Change-Post: 4 %
FEV1FVC-%Pred-Pre: 104 %
FEV6-%Change-Post: -6 %
FEV6-%Pred-Post: 73 %
FEV6-%Pred-Pre: 78 %
FEV6-Post: 2.28 L
FEV6-Pre: 2.44 L
FEV6FVC-%Pred-Post: 103 %
FEV6FVC-%Pred-Pre: 103 %
FVC-%Change-Post: -6 %
FVC-%Pred-Post: 70 %
FVC-%Pred-Pre: 75 %
FVC-Post: 2.28 L
FVC-Pre: 2.44 L
Post FEV1/FVC ratio: 85 %
Post FEV6/FVC ratio: 100 %
Pre FEV1/FVC ratio: 82 %
Pre FEV6/FVC Ratio: 100 %
RV % pred: 66 %
RV: 1.31 L
TLC % pred: 75 %
TLC: 3.75 L

## 2017-12-23 NOTE — Progress Notes (Signed)
PFT done today. 

## 2017-12-24 NOTE — Progress Notes (Signed)
Cardiology Office Note   Date:  12/27/2017   ID:  [name], DOB 22-Jan-1956, MRN [id number]  PCP:  Rita Ohara, MD  Cardiologist:  Dr. Johnsie Cancel     No chief complaint on file.     History of Present Illness: [name] is a 62 y.o. female who presents for post hospitalization with hx of Fibromyalgia, gastroparesis, anxiety, acid reflux, chronic fatigue, neuropathy, hyperlipidemia, migraines, OSA (has not tolerated CPAP), RLS, GERD.  She was admitted 12/06/17 with chest pain.  Troponin was elevated 0.29 though all Troponin I was neg <0.03 X 3.  Admitted on IV heparin and ASA .  She had neg CTA for PE and nuc study was low risk with EF 63%.   She had ST and meds adjusted, her LDL was great at 50, continued statin.   Seen by PA 12/18/17 complained of fatigue and dyspnea beta blocker decreased BNP was normal Mildly azotemic BUN 23  PFT;s ordered done 12/23/17 no bronchodilator response not read yet   She had what she calls a very "ugly" visit with Dr Lenna Gilford yesterday He indicated she "was just fat" She continues to have what she thinks is scary and severe episodes of dyspnea mostly with Exertion Chest can be tight when she gets very dyspnic  Discussed options and feel right and left cath indicated to measure pulmonary pressures and r/o CAD Risks including stroke bleeding need for emergency surgery discussed willing to proceed   Past Medical History:  Diagnosis Date  . Anemia    previously followed by Dr. Jamse Arn for anemia and elevated platelets  . Anxiety   . C. difficile colitis 10/01/12   treated by [facility]  . Chronic fatigue syndrome   . DDD (degenerative disc disease), lumbar 08/19/14   and facet arthroplasty & left lumbar radiculopathy (Dr.Ramos)  . Depression   . Dyssynergia    dyssynergenic defecation, contributing to fecal incontinence.  . Edema   . Fibromyalgia   . Gastroparesis    followed at [facility]  . GERD (gastroesophageal reflux disease)   . Hyperlipidemia   .  Kidney stone   . Lumbar radiculopathy   . Migraine   . Neuropathy   . Obstructive sleep apnea    Does not wear CPAP  . Paresthesia    Dr. Everette Rank at [facility]  . Pelvic floor dysfunction    pelvic floor dyssynergy  . Plantar fasciitis 02/2011   R foot  . Pneumonia    2012  . PONV (postoperative nausea and vomiting)    pt states has gastroparesis has difficulty taking antibiotics and narcotics has severe nausea and vomiting   . Restless leg syndrome   . S/P endometrial ablation 08/09/2006   Novasure Ablation  . S/P epidural steroid injection 09/20/14   Dr.Ramos  . Tremor    Dr. Everette Rank  . Urinary frequency   . Urinary incontinence     Past Surgical History:  Procedure Laterality Date  . CHOLECYSTECTOMY  9/05  . ENDOMETRIAL ABLATION  08/09/2006   Dr. Valentina Shaggy Ablation  . FACET JOINT INJECTION  04/17/2017   Left L4-5 and L5-S1  . KNEE ARTHROPLASTY    . KNEE SURGERY  1999   R knee, Dr. Eddie Dibbles, torn cartilage  . TONSILLECTOMY  1968  . TONSILLECTOMY    . TOTAL KNEE ARTHROPLASTY Right 09/05/2015   Procedure: RIGHT TOTAL KNEE ARTHROPLASTY;  Surgeon: Gaynelle Arabian, MD;  Location: [facility];  Service: Orthopedics;  Laterality: Right;  Current Outpatient Medications  Medication Sig Dispense Refill  . albuterol (PROVENTIL HFA;VENTOLIN HFA) 108 (90 Base) MCG/ACT inhaler Inhale 1-2 puffs into the lungs every 6 (six) hours as needed for wheezing or shortness of breath. Reported on 05/30/2016 1 Inhaler 3  . ALPRAZolam (XANAX) 0.25 MG tablet Take 0.25 mg by mouth 3 (three) times daily as needed for anxiety.     . ARIPiprazole (ABILIFY) 5 MG tablet Take 5 mg by mouth daily.  5  . Armodafinil 250 MG tablet Take 250 mg by mouth daily.    Marland Kitchen atorvastatin (LIPITOR) 40 MG tablet Take 1 tablet (40 mg total) by mouth daily. 90 tablet 0  . cetirizine (ZYRTEC) 10 MG tablet Take 10 mg by mouth at bedtime.    . cholecalciferol (VITAMIN D) 1000 units tablet Take 1,000 Units by mouth  daily.    . colchicine 0.6 MG tablet Take 2 tablets by mouth at onset of gout flare. Taken an additional tablet 1 hour later, if needed. May take like this every 3d, if needed 30 tablet 0  . DULoxetine (CYMBALTA) 60 MG capsule Take 60 mg by mouth 2 (two) times daily.     . fluticasone (FLONASE) 50 MCG/ACT nasal spray PLACE 2 SPRAYS INTO BOTH NOSTRILS DAILY. 16 g 0  . furosemide (LASIX) 20 MG tablet Take 1 tablet (20 mg total) by mouth daily. 30 tablet 11  . metoprolol tartrate (LOPRESSOR) 50 MG tablet Take 1 tablet (50 mg total) by mouth 2 (two) times daily. 180 tablet 3  . nortriptyline (PAMELOR) 25 MG capsule Take 75-100 mg by mouth at bedtime.    . pantoprazole (PROTONIX) 40 MG tablet Take 40 mg by mouth 2 (two) times daily.    . Probiotic Product ([place]) CAPS Take 1 capsule by mouth daily. Reported on 01/02/2016    . prochlorperazine (COMPAZINE) 10 MG tablet Take 10 mg by mouth every 6 (six) hours as needed for nausea or vomiting. Reported on 05/09/2016    . promethazine (PHENERGAN) 25 MG suppository Place 25 mg rectally every 6 (six) hours as needed for nausea or vomiting. Reported on 05/09/2016    . Propylene Glycol (SYSTANE COMPLETE) 0.6 % SOLN Place 1 drop into both eyes 4 (four) times daily.    . ranitidine (ZANTAC) 300 MG tablet Take 300 mg by mouth 2 (two) times daily.     Marland Kitchen topiramate (TOPAMAX) 25 MG tablet Take 2 tablets (50 mg total) by mouth at bedtime. 180 tablet 0  . allopurinol (ZYLOPRIM) 300 MG tablet TAKE 1 TABLET (300 MG TOTAL) DAILY BY MOUTH. 90 tablet 0   No current facility-administered medications for this visit.     Allergies:   Erythromycin; Tramadol; and Dilaudid [hydromorphone hcl]    Social History:  The patient  reports that  has never smoked. she has never used smokeless tobacco. She reports that she does not drink alcohol or use drugs.   Family History:  The patient's family history includes Allergies in her mother and sister; Asthma in her sister;  Cancer in her maternal aunt and maternal aunt; Colon cancer in her maternal aunt; Diabetes in her father; Heart disease in her father, mother, paternal grandfather, and paternal grandmother; Hypertension in her father and mother; Irritable bowel syndrome in her sister; Kidney disease in her father; Macular degeneration in her mother; Stroke (age of onset: 45) in her father.    ROS:  General:no colds or fevers, no weight changes Skin:no rashes or ulcers HEENT:no blurred vision,  no congestion CV:see HPI PUL:see HPI GI:no diarrhea constipation or melena, no indigestion GU:no hematuria, no dysuria MS:no joint pain, no claudication Neuro:no syncope, no lightheadedness Endo:no diabetes, no thyroid disease  Wt Readings from Last 3 Encounters:  12/27/17 231 lb 8 oz (105 kg)  12/26/17 231 lb 9.6 oz (105.1 kg)  12/18/17 227 lb 1.9 oz (103 kg)     PHYSICAL EXAM: VS:  BP 128/78   Pulse (!) 101   Ht 5\' 2"  (1.575 m)   Wt 231 lb 8 oz (105 kg)   LMP 07/27/2006   SpO2 97%   BMI 42.34 kg/m  , BMI Body mass index is 42.34 kg/m. General:Pleasant affect, NAD Skin:Warm and dry, brisk capillary refill HEENT:normocephalic, sclera clear, mucus membranes moist Neck:supple, no JVD, no bruits  Heart:S1S2 RRR without murmur, gallup, rub or click Lungs:clear without rales, rhonchi, or wheezes ZDG:LOVF, non tender, + BS, do not palpate liver spleen or masses Ext:no lower ext edema, 2+ pedal pulses, 2+ radial pulses Neuro:alert and oriented X 3, MAE, follows commands, + facial symmetry    EKG:  EKG is ordered today. The ekg ordered today demonstrates  SR at 67 no changes from previous.    Recent Labs: 12/06/2017: ALT 22; TSH 1.632 12/07/2017: Hemoglobin 12.8; Platelets 397 12/18/2017: BUN 23; Creatinine, Ser 0.79; NT-Pro BNP 28; Potassium 4.2; Sodium 142    Lipid Panel    Component Value Date/Time   CHOL 119 12/07/2017 0500   TRIG 186 (H) 12/07/2017 0500   HDL 32 (L) 12/07/2017 0500   CHOLHDL  3.7 12/07/2017 0500   VLDL 37 12/07/2017 0500   LDLCALC 50 12/07/2017 0500       Other studies Reviewed: Additional studies/ records that were reviewed today include:  ECHO 12/07/17. Study Conclusions  - Left ventricle: The cavity size was normal. Wall thickness was   increased in a pattern of mild LVH. Systolic function was normal.   The estimated ejection fraction was in the range of 60% to 65%.   Although no diagnostic regional wall motion abnormality was   identified, this possibility cannot be completely excluded on the   basis of this study. Doppler parameters are consistent with   abnormal left ventricular relaxation (grade 1 diastolic   dysfunction). - Aortic valve: There was no stenosis. - Mitral valve: There was no significant regurgitation. - Right ventricle: The cavity size was normal. Systolic function   was normal. - Pulmonary arteries: No complete TR doppler jet so unable to   estimate PA systolic pressure. - Inferior vena cava: The vessel was normal in size. The   respirophasic diameter changes were in the normal range (>= 50%),   consistent with normal central venous pressure. - Pericardium, extracardiac: A trivial pericardial effusion was   identified posterior to the heart.  Impressions:  - Normal LV size with mild LV hypertrophy. EF 60-65%. Normal RV   size and systolic function. No significant valvular   abnormalities.  NUC study 12/08/17  There was no ST segment deviation noted during stress.  No T wave inversion was noted during stress.  Defect 1: There is a small defect of severe severity present in the apical anterior, apical septal and apex location.  This is a low risk study.  The left ventricular ejection fraction is normal (55-65%).  Nuclear stress EF: 63%.   Low risk, probably normal stress nuclear study with breast attenuation but no ischemia; EF 63 with normal wall motion.  ASSESSMENT AND PLAN:  1. Chest  pain has resolved and  neg nuc study see below   2. Pt still DOE severe I would agree that it is more likely OSA, obesity and restrictive lung disease But patient indicates change in symptoms Feel right and left cath indicated to definitively rlo CAD And measure lung pressures. Would also help clear her for bariatric surgery . Have scheduled for 01/01/18 Labs ordered cath lab arranged   3. Will decrease loprssor to 50 BID, BB seems to have increased fatigue and increased DOE.  Will see her back in 1 week with lower dose BB to eval.  No further lopressor today as she has taken 200 mg already.  She will hold her lasix as well for today.  Will need to check HR on next visit, elevated in the hospital.    4.  Possible due to diastolic HF  But BNP was normal 12/18/17   Her husband drove her today.   Current medicines are reviewed with the patient today.  The patient Has no concerns regarding medicines.  The following changes have been made:  See above Labs/ tests ordered today include:see above  Disposition:   FU:  see above  Signed, Jenkins Rouge, MD  12/27/2017 8:28 AM    [facility] [address] [place], [place], [place], [place] Phone: [phone]; Fax: [phone]

## 2017-12-24 NOTE — H&P (View-Only) (Signed)
Cardiology Office Note   Date:  12/27/2017   ID:  [name], DOB [birth year], MRN [id number]  PCP:  Rita Ohara, MD  Cardiologist:  Dr. Johnsie Cancel     No chief complaint on file.     History of Present Illness: [name] is a 62 y.o. female who presents for post hospitalization with hx of Fibromyalgia, gastroparesis, anxiety, acid reflux, chronic fatigue, neuropathy, hyperlipidemia, migraines, OSA (has not tolerated CPAP), RLS, GERD.  She was admitted 12/06/17 with chest pain.  Troponin was elevated 0.29 though all Troponin I was neg <0.03 X 3.  Admitted on IV heparin and ASA .  She had neg CTA for PE and nuc study was low risk with EF 63%.   She had ST and meds adjusted, her LDL was great at 50, continued statin.   Seen by PA 12/18/17 complained of fatigue and dyspnea beta blocker decreased BNP was normal Mildly azotemic BUN 23  PFT;s ordered done 12/23/17 no bronchodilator response not read yet   She had what she calls a very "ugly" visit with Dr Lenna Gilford yesterday He indicated she "was just fat" She continues to have what she thinks is scary and severe episodes of dyspnea mostly with Exertion Chest can be tight when she gets very dyspnic  Discussed options and feel right and left cath indicated to measure pulmonary pressures and r/o CAD Risks including stroke bleeding need for emergency surgery discussed willing to proceed   Past Medical History:  Diagnosis Date  . Anemia    previously followed by Dr. Jamse Arn for anemia and elevated platelets  . Anxiety   . C. difficile colitis 10/01/12   treated by [facility]  . Chronic fatigue syndrome   . DDD (degenerative disc disease), lumbar 08/19/14   and facet arthroplasty & left lumbar radiculopathy (Dr.Ramos)  . Depression   . Dyssynergia    dyssynergenic defecation, contributing to fecal incontinence.  . Edema   . Fibromyalgia   . Gastroparesis    followed at [facility]  . GERD (gastroesophageal reflux disease)   . Hyperlipidemia   .  Kidney stone   . Lumbar radiculopathy   . Migraine   . Neuropathy   . Obstructive sleep apnea    Does not wear CPAP  . Paresthesia    Dr. Everette Rank at [facility]  . Pelvic floor dysfunction    pelvic floor dyssynergy  . Plantar fasciitis 02/2011   R foot  . Pneumonia    2012  . PONV (postoperative nausea and vomiting)    pt states has gastroparesis has difficulty taking antibiotics and narcotics has severe nausea and vomiting   . Restless leg syndrome   . S/P endometrial ablation 08/09/2006   Novasure Ablation  . S/P epidural steroid injection 09/20/14   Dr.Ramos  . Tremor    Dr. Everette Rank  . Urinary frequency   . Urinary incontinence     Past Surgical History:  Procedure Laterality Date  . CHOLECYSTECTOMY  9/05  . ENDOMETRIAL ABLATION  08/09/2006   Dr. Valentina Shaggy Ablation  . FACET JOINT INJECTION  04/17/2017   Left L4-5 and L5-S1  . KNEE ARTHROPLASTY    . KNEE SURGERY  1999   R knee, Dr. Eddie Dibbles, torn cartilage  . TONSILLECTOMY  1968  . TONSILLECTOMY    . TOTAL KNEE ARTHROPLASTY Right 09/05/2015   Procedure: RIGHT TOTAL KNEE ARTHROPLASTY;  Surgeon: Gaynelle Arabian, MD;  Location: [facility];  Service: Orthopedics;  Laterality: Right;  Current Outpatient Medications  Medication Sig Dispense Refill  . albuterol (PROVENTIL HFA;VENTOLIN HFA) 108 (90 Base) MCG/ACT inhaler Inhale 1-2 puffs into the lungs every 6 (six) hours as needed for wheezing or shortness of breath. Reported on 05/30/2016 1 Inhaler 3  . ALPRAZolam (XANAX) 0.25 MG tablet Take 0.25 mg by mouth 3 (three) times daily as needed for anxiety.     . ARIPiprazole (ABILIFY) 5 MG tablet Take 5 mg by mouth daily.  5  . Armodafinil 250 MG tablet Take 250 mg by mouth daily.    Marland Kitchen atorvastatin (LIPITOR) 40 MG tablet Take 1 tablet (40 mg total) by mouth daily. 90 tablet 0  . cetirizine (ZYRTEC) 10 MG tablet Take 10 mg by mouth at bedtime.    . cholecalciferol (VITAMIN D) 1000 units tablet Take 1,000 Units by mouth  daily.    . colchicine 0.6 MG tablet Take 2 tablets by mouth at onset of gout flare. Taken an additional tablet 1 hour later, if needed. May take like this every 3d, if needed 30 tablet 0  . DULoxetine (CYMBALTA) 60 MG capsule Take 60 mg by mouth 2 (two) times daily.     . fluticasone (FLONASE) 50 MCG/ACT nasal spray PLACE 2 SPRAYS INTO BOTH NOSTRILS DAILY. 16 g 0  . furosemide (LASIX) 20 MG tablet Take 1 tablet (20 mg total) by mouth daily. 30 tablet 11  . metoprolol tartrate (LOPRESSOR) 50 MG tablet Take 1 tablet (50 mg total) by mouth 2 (two) times daily. 180 tablet 3  . nortriptyline (PAMELOR) 25 MG capsule Take 75-100 mg by mouth at bedtime.    . pantoprazole (PROTONIX) 40 MG tablet Take 40 mg by mouth 2 (two) times daily.    . Probiotic Product ([place]) CAPS Take 1 capsule by mouth daily. Reported on 01/02/2016    . prochlorperazine (COMPAZINE) 10 MG tablet Take 10 mg by mouth every 6 (six) hours as needed for nausea or vomiting. Reported on 05/09/2016    . promethazine (PHENERGAN) 25 MG suppository Place 25 mg rectally every 6 (six) hours as needed for nausea or vomiting. Reported on 05/09/2016    . Propylene Glycol (SYSTANE COMPLETE) 0.6 % SOLN Place 1 drop into both eyes 4 (four) times daily.    . ranitidine (ZANTAC) 300 MG tablet Take 300 mg by mouth 2 (two) times daily.     Marland Kitchen topiramate (TOPAMAX) 25 MG tablet Take 2 tablets (50 mg total) by mouth at bedtime. 180 tablet 0  . allopurinol (ZYLOPRIM) 300 MG tablet TAKE 1 TABLET (300 MG TOTAL) DAILY BY MOUTH. 90 tablet 0   No current facility-administered medications for this visit.     Allergies:   Erythromycin; Tramadol; and Dilaudid [hydromorphone hcl]    Social History:  The patient  reports that  has never smoked. she has never used smokeless tobacco. She reports that she does not drink alcohol or use drugs.   Family History:  The patient's family history includes Allergies in her mother and sister; Asthma in her sister;  Cancer in her maternal aunt and maternal aunt; Colon cancer in her maternal aunt; Diabetes in her father; Heart disease in her father, mother, paternal grandfather, and paternal grandmother; Hypertension in her father and mother; Irritable bowel syndrome in her sister; Kidney disease in her father; Macular degeneration in her mother; Stroke (age of onset: 55) in her father.    ROS:  General:no colds or fevers, no weight changes Skin:no rashes or ulcers HEENT:no blurred vision,  no congestion CV:see HPI PUL:see HPI GI:no diarrhea constipation or melena, no indigestion GU:no hematuria, no dysuria MS:no joint pain, no claudication Neuro:no syncope, no lightheadedness Endo:no diabetes, no thyroid disease  Wt Readings from Last 3 Encounters:  12/27/17 231 lb 8 oz (105 kg)  12/26/17 231 lb 9.6 oz (105.1 kg)  12/18/17 227 lb 1.9 oz (103 kg)     PHYSICAL EXAM: VS:  BP 128/78   Pulse (!) 101   Ht 5\' 2"  (1.575 m)   Wt 231 lb 8 oz (105 kg)   LMP 07/27/2006   SpO2 97%   BMI 42.34 kg/m  , BMI Body mass index is 42.34 kg/m. General:Pleasant affect, NAD Skin:Warm and dry, brisk capillary refill HEENT:normocephalic, sclera clear, mucus membranes moist Neck:supple, no JVD, no bruits  Heart:S1S2 RRR without murmur, gallup, rub or click Lungs:clear without rales, rhonchi, or wheezes PZW:CHEN, non tender, + BS, do not palpate liver spleen or masses Ext:no lower ext edema, 2+ pedal pulses, 2+ radial pulses Neuro:alert and oriented X 3, MAE, follows commands, + facial symmetry    EKG:  EKG is ordered today. The ekg ordered today demonstrates  SR at 67 no changes from previous.    Recent Labs: 12/06/2017: ALT 22; TSH 1.632 12/07/2017: Hemoglobin 12.8; Platelets 397 12/18/2017: BUN 23; Creatinine, Ser 0.79; NT-Pro BNP 28; Potassium 4.2; Sodium 142    Lipid Panel    Component Value Date/Time   CHOL 119 12/07/2017 0500   TRIG 186 (H) 12/07/2017 0500   HDL 32 (L) 12/07/2017 0500   CHOLHDL  3.7 12/07/2017 0500   VLDL 37 12/07/2017 0500   LDLCALC 50 12/07/2017 0500       Other studies Reviewed: Additional studies/ records that were reviewed today include:  ECHO 12/07/17. Study Conclusions  - Left ventricle: The cavity size was normal. Wall thickness was   increased in a pattern of mild LVH. Systolic function was normal.   The estimated ejection fraction was in the range of 60% to 65%.   Although no diagnostic regional wall motion abnormality was   identified, this possibility cannot be completely excluded on the   basis of this study. Doppler parameters are consistent with   abnormal left ventricular relaxation (grade 1 diastolic   dysfunction). - Aortic valve: There was no stenosis. - Mitral valve: There was no significant regurgitation. - Right ventricle: The cavity size was normal. Systolic function   was normal. - Pulmonary arteries: No complete TR doppler jet so unable to   estimate PA systolic pressure. - Inferior vena cava: The vessel was normal in size. The   respirophasic diameter changes were in the normal range (>= 50%),   consistent with normal central venous pressure. - Pericardium, extracardiac: A trivial pericardial effusion was   identified posterior to the heart.  Impressions:  - Normal LV size with mild LV hypertrophy. EF 60-65%. Normal RV   size and systolic function. No significant valvular   abnormalities.  NUC study 12/08/17  There was no ST segment deviation noted during stress.  No T wave inversion was noted during stress.  Defect 1: There is a small defect of severe severity present in the apical anterior, apical septal and apex location.  This is a low risk study.  The left ventricular ejection fraction is normal (55-65%).  Nuclear stress EF: 63%.   Low risk, probably normal stress nuclear study with breast attenuation but no ischemia; EF 63 with normal wall motion.  ASSESSMENT AND PLAN:  1. Chest  pain has resolved and  neg nuc study see below   2. Pt still DOE severe I would agree that it is more likely OSA, obesity and restrictive lung disease But patient indicates change in symptoms Feel right and left cath indicated to definitively rlo CAD And measure lung pressures. Would also help clear her for bariatric surgery . Have scheduled for 01/01/18 Labs ordered cath lab arranged   3. Will decrease loprssor to 50 BID, BB seems to have increased fatigue and increased DOE.  Will see her back in 1 week with lower dose BB to eval.  No further lopressor today as she has taken 200 mg already.  She will hold her lasix as well for today.  Will need to check HR on next visit, elevated in the hospital.    4.  Possible due to diastolic HF  But BNP was normal 12/18/17   Her husband drove her today.   Current medicines are reviewed with the patient today.  The patient Has no concerns regarding medicines.  The following changes have been made:  See above Labs/ tests ordered today include:see above  Disposition:   FU:  see above  Signed, Jenkins Rouge, MD  12/27/2017 8:28 AM    [facility], [place], [place], [place] Phone: [phone]; Fax: [phone]

## 2017-12-26 ENCOUNTER — Telehealth: Payer: Self-pay | Admitting: Pulmonary Disease

## 2017-12-26 ENCOUNTER — Ambulatory Visit: Payer: 59 | Admitting: Pulmonary Disease

## 2017-12-26 ENCOUNTER — Encounter: Payer: Self-pay | Admitting: Pulmonary Disease

## 2017-12-26 VITALS — BP 128/70 | HR 104 | Temp 97.6°F | Ht 62.0 in | Wt 231.6 lb

## 2017-12-26 DIAGNOSIS — F411 Generalized anxiety disorder: Secondary | ICD-10-CM

## 2017-12-26 DIAGNOSIS — G4733 Obstructive sleep apnea (adult) (pediatric): Secondary | ICD-10-CM | POA: Diagnosis not present

## 2017-12-26 DIAGNOSIS — Z6841 Body Mass Index (BMI) 40.0 and over, adult: Secondary | ICD-10-CM | POA: Diagnosis not present

## 2017-12-26 DIAGNOSIS — K219 Gastro-esophageal reflux disease without esophagitis: Secondary | ICD-10-CM

## 2017-12-26 DIAGNOSIS — E669 Obesity, unspecified: Secondary | ICD-10-CM | POA: Diagnosis not present

## 2017-12-26 DIAGNOSIS — Z8709 Personal history of other diseases of the respiratory system: Secondary | ICD-10-CM

## 2017-12-26 DIAGNOSIS — E66813 Obesity, class 3: Secondary | ICD-10-CM

## 2017-12-26 DIAGNOSIS — R0600 Dyspnea, unspecified: Secondary | ICD-10-CM | POA: Diagnosis not present

## 2017-12-26 DIAGNOSIS — J984 Other disorders of lung: Secondary | ICD-10-CM

## 2017-12-26 DIAGNOSIS — K3184 Gastroparesis: Secondary | ICD-10-CM

## 2017-12-26 NOTE — Patient Instructions (Signed)
Today we updated your med list in our EPIC system...    Continue your current medications the same...  You may continue to use the ALBUTEROL inhaler 1-2 sprays every 6H as needed for wheezing...  We reviewed your recent CXR, PFT, and oximetry testing-- the good news is that your lungs are working well (you don't need additional breathing meds, oxygen, etc), the air movement is just slightly restricted by your weight but this will definitely improve w/ weight reduction and a good exercise program-- start these NOW and work with your PCP on weight reduction thru diet & exercise...  I recommend that you consider Bariatric Surg- get other opinions from your PCP, GI Team at [facility], & your cardiologists.  We are trying to get info from [facility] regarding your CPAP & it's effectiveness- they tell me that you'll need to bring your machine or the "card" to them to read it for [place]-- then they will send this report to my attention to review & we will let you know (you may need a sleep med follow up for adjustments)... In the meanwhile continue using the CPAP nightly as you are doing...  It does not appear that the Alprazolam taken 3 times daily was helpful relaxing your chest wall muscles (this can relieve some of the shortness of breath & allow you to get a deeper more satisfying inhalation)-- perhaps [name]'s team can suggest further medication adjustments that might improve this further...  Call for any questions or if we can be of service in any way.Marland KitchenMarland Kitchen

## 2017-12-26 NOTE — Progress Notes (Addendum)
Subjective:     Patient ID: [name], female   DOB: May 12, 1956, 62 y.o.   MRN: [id number]  HPI    62 y/o WF, referred by [name] for eval of dyspnea=> felt to be multifactorial w/ components from OSA, obesity w/ an element of pulmonary restrictive dis, GERD/ gastroparesis w/ hx aspiration (followed at [facility]), deconditioning due to sedentary lifestyle, and anxiety w/ "chest wall musc spasm";  She has numerous other medical issues including FM/ CFS, neuropathy, migraines, DJD- s/p TKR...  ~  January 25, 2016:  Initial pulmonary consult by SN>  She is the sister of [name] who works in our allergy clinic...    62 y/o WF prev eval by PW in 2013 w/ SOB/ breathing issues (GERD/ LPR w/ ?microaspiration) & currently followed by PCP- Dr.[name];  Pt has no other hx of lung dis but sister [name] has Asthma;  [name] is obese (wt=208# in a 5'2" frame for a BMI=38);  She has known OSA on CPAP- prev eval & rx by [name] but this was yrs ago, pt not currently using it regularly, states she does better when using it regularly, and notes "I have CFS also"... [name] gives a quite rambling hx-- states several months of SOB/ DOE, CP; [facility] "no blood clots and the ruled out my heart";  Given inhaler (no benefit) & late Pred (this helped);  Then end of [id number]- ER eval, MRI, Neurology & Psyche called, ?withdrawals, adm ICU [facility], stopped all meds, disch w/ home PT/OT...  She notes breathing is better now- prev describes "couldn't get my air IN", "couldn't get a deep breath" "didn't feel satisfied breathing";  She notes some cough, occas wheezing, no sput, no hemoptysis, some sharp CP/ tenderness over sternum...  Smoking Hx>  She is a NEVER smoker...  Pulmonary Hx>   She has hx OSA w/ initial Sleep eval by [name], on CPAP x yrs she says; had pneumonia x1 in 2012; no hx asthma, recurrent bronchitic infections, no hx TB or known exposure...    Cardiac Hx>  Cards eval by [name], last  seen  2DEcho 07/2015 (done for dyspnea) showed   Medical Hx>  PCP=[name] on Atorva for Chol;  She has a hx gastroparesis w/ eval [facility]- [name] on Protonix40Bid, Compazine, Zofran;  Hx anxiety/ depression prev Dr [name] w/ Abilify, Nortrip, Cymbalta, Alprazolam...  Family Hx>  + HBP, +DM in father, heart dis, stroke; sister has asthma, IBS...  Occup Hx>  [employer], married, 2 children  Current Meds>  Flonase, Proair, Metoprolol, Lipitor, Protonix, Zofran, Compazine, Xanax, Pamelor, Cymbalta EXAM shows Afeb, VSS, O2sat=96% on RA;  208#, 5'3"Tall, BMI=37;  HEENT- neg, mallampati4, short/fat neck;  Chest- decr excursions, clear w/o w/r/r;  Cor- RR w/o m/r/g;  Abd- obese, soft, +pannuiculus;  Ext- neg w/o c/c/e;  Neuro- intact.   2DEcho 08/23/15 showed mod focal basal hypertrophy, norm LVF w/ EF=60-65%, no regional wall motion abn, Gr1DD, lipomatous hypertrophy of the atrial septum, valves ok, RV ok   EKG 12/22/15 showed NSR, rate100, rightward axis, low voltage, otherw wnl...  Last CXR 12/23/16>  Norm heart size, mild chr peribronchial thickening, clear- NAD, obese soft tissues...   CT Angio Chest 11/27/15 via ER> NEG for PE, vessels are ok, clear lungs w/ mild basilar atx, no adenopathy, s/pGB, pancreas & adrenals ok, old fx betw manubrium & body of the sternum,   Spirometry 01/25/16>  FVC=2.29 (78%), FEV1=1.92 (81%), %1sec=84, mid-flows wnl at  87% predicted;  This is c/w borderline restriction (need lung volume measurement for confirmation)...   Ambulatory Oximetry 01/25/16> O2sat=97% on RA at rest w/ HR=65/min; she ambulated 3 Laps (185'ea) w/ lowest O2sat=96% w/ HR=89/min...  LABS Jan-Feb2017 in Epic>  Chems- wnl x Alb=3.3;  CBC- mild anemia w/ Hg=11.6, mcv=88;  TSH=1.88  IMP>>    Dyspnea-- this is multifactorial w/ OSA, pulm restriction from obesity, deconditioning, & "chest wall factors" all playing a roll.    Hx aspiration pneumonia    Known GERD/Gastroparesis & ? LPR-- she needs a  vigorous antireflux regimen w/ Protonix40Bid, NPO after dinner, elev HOB 6", etc...    OSA-- we do not have any old data, therefore needs re-assessment & restart CPAP based on her current situation.    OBESITY-- obviously DIET & EXERCISE are the keys, but due to her health status she should consider Bariatric surg.    Hx RLS-- I assume this was a finding on her previous sleep eval, not on specific rx for this at present, ?followed by Neuro?    Medical issues-- HL, DJD w/ R-TKR in 2016, Gout, FM/CFS, Migraines, ?Neuropathy, Anxiety/Depression;  Her PCP=DrEveKnapp, Neuro= [facility] DrLTonuzi PLAN>>      Allecia presents w/ multifactorial dyspnea and I explained that there is no easy/ quick fix;  The good news is that herCXR, PFT & oxygenaton are normal indicating that her lungs are healthy;  To help her w/ the dyspnea she needs to do the following:  1)  Must lose weight- diet & exercise are the keys but Bariatric surg would be a reasonable approach if she has been unable to successfully lose wt in the pat;  2) she must improve her conditioning- this requires a regular exercise program & she cannot do this on her own, needs help of family/ trainer/ etc & suggested to join the Y[employer] sme[employer]/ etc;  3)  For the chest wall factor-- I find that Alprazolam/ Klonopin are the best meds to allow a deeper breath & help the pt feel more satisfied breathing, & it may take a dose adjustment to help maximally;  4)  Must follow a vigorous antireflux regimen-- Protonix40Bid, NPO after dinner, elevate HOB 6"...  ~  June 19, 2017:  42mo ROV & Amarea is here to get a refill of her Proair rescue inhaler (I reminded her that this could easily be refilled by her PCP going forward);  I reviewed [facility] records going back 1.5 yrs>>    She is followed by [name] & last seen 01/07/17>  She's had an extensive eval for her GASTROPARESIS & severe nausea- gastic empty study, EGD, Ba swallow, pH study &  manometry; on Protonix40Bid, Zantac300Hs, plus Compazine/ Phenergan/ Zofran & even Marinol in the [past;      She is followed by DrDeveshwar for Rheum & last seen 02/21/17> hx OA, s/p R-TKR 08/2015 by DrAlusio, Hx gout on Allopurinol per PCP, and FM; notes good days and bad, has some LBP (lumbar spondylosis) w/ radiation to left leg & has received ESI from [facility] 04/17/17;     She continues to f/u w/ [facility]-DrTonuzi & last seen 05/09/17>  Neuropathy, migraines, tremor, FM, depression, etc;  Pt also has OSA w/ remote eval many yrs ago by [name] but hasn't followed up in yrs and DrTonuzi apparently has not picked up on or re-evaluated her OSA (see below);  Pt c/o dizziness, neuropathy symptoms, gait abn; on Nu-vigil, Pamelor, Topamax...     I  called [facility] at [phone]  They checked their records and tell me that orig sleep study by [name] was 2008 w/ AHI=21.9;  Pt received a replacement CPAP machine 04/01/13 ordered by [name]- [employer] S9, set at [facility] & she has apparently never had a download checked (Pt indicated to me that she thought she got a new machine last yr);  Pt states that she keeps up w/ mask & tubing changes (uses nasal pillows) but [facility] says that her supplies were refilled- 02/2017, 06/2015, 06/2014... She indicates that she uses the CPAP Qhs & it seems to be working well to her- rests satis, wakes refreshed, min daytime sleepiness, rarely naps, no prob w/ driving etc... She indicates to me that Ovidio Kin is managing her sleep apnea, restless legs, etc...     She last saw PCP-[name] 05/23/17>  Follow up visit after ER eval for back pain & RLQ pain, note reviewed & mult somatic complaints seem to come & go; ER eval included CT Abd&Pelvis- tiny 8mm nodular density RLL, mild fatty liver, s/pGB, 2.5cm fat containing umbil hernia, otherw NEG- NAD;  LABS in ER were also wnl;  She sees DrRamos for pain management- on topical preps + Tylenol, Robaxin, etc...    Note:  Pt  used to see [name] who has moved her practice & currently sees "Rollene Fare" in their office for her meds- Abilify, Cymbalta, Xanax...     From the pulmonary standpoint Marsa has few complaints- says she had a "flair-up" last weekend & reached for her rescue inhaler (ProairHFA) but it was empty=> sched this follow up appt;  We reviewed our prev Pulm eval, diagnoses, & recommendations-- she has had a difficult time w/ diet & not able to exercise she says; weight is up 10# from last yr & BMI=40; she did not inquire about Bariatric surg stating that her GI team didn't think it was a good idea; she does follow the antireflux regimen w/ Protonix, Zantac, NPO after dinner & elev HOB; she has not been taking the Alprazolam regularly (prefers prn route)...    EXAM shows Afeb, VSS, O2sat=99% on RA;  217#, 5'3"Tall, BMI=40;  HEENT- neg, mallampati4, short/fat neck, parotid hypertrophy;  Chest- decr excursions, clear w/o w/r/r;  Cor- RR w/o m/r/g;  Abd- obese, soft, +pannuiculus;  Ext- neg w/o c/c/e;  Neuro- intact.  IMP/PLAN>>  I stressed to [place] the importance of diet/ exercise/ wt reduction; she desperately needs to lose the weight & start moving; I think it would e helpful to talk to the Bariatric surgeons about her situation, but she wants to clear it w/ her gastroenterologists first... She will maintain her vigorous antireflux regimen, and try to incr the Alprazolam as a trial to 0.25mg  Bid-Tid to see if this helps her breathing (allowing her to get a better, deeper breath & feel more satisfied breathing... We refilled her ProairHFA rescue inhaler per her request...      ~  December 26, 2017:  22mo ROV and Joesphine returns c/o SOB/ DOE/ dizzy & short winded when she bends over => nothing has changed since I first evaluated her in [id number] except she has gained 25# up to 232# today;  Her initial pulm eval revealed obesity, mild pulm restriction w/o obstructive dis, normal oxygenation, poor conditioning via lack of  exercise, hx of OSA prev eval by [name], and a constellation of problems including GERD/ Gastroparesis/ LPRD, DJD, Gout, FM/CFS, Migraines, Neuropathy, anxiety & depression;  She is cared for by a team of physicians  including:  PCP-[name],  SLEEP- [name] & Tomi Bamberger, [facility]- DrNishan et al,  GI- [name] &Cook at [place],  [place]- DrDeveshwar,  [place] in [place], [place]... Interval Epic review shows the following>>    She saw NEURO-DrTonuzi 09/12/17>  Pt w/ tremor, asterixis, FM, paresthesias, RLS, depression, &?serotonin syndrome; c/o foot pain- on mult meds including cymbalta, nortriptyline, lyrica, topamax, xanax, tregretol, effexor, tramadol requip; not reviewed, tried to wean Nortriptyline...    She was HOSP 1/11 - 12/08/17 by [facility]> adm by DrC w/ CP, SOB, sl elev troponin (0.29), & dizzy when she bends over; Prev eval by DrNishan in 2016 w/ 2DEcho showing mild LVH & Gr1DD, norm Myoview;  She had repeat eval w/ CXR showing obesity & low lung vols;  CT Angio chest neg for PE;  2DEcho showing mild LVH, EF=60-65%, no regional wall motion abn, Gr1DD, norm valves, norm RV, etc;  Myoview low risk w/ breast attenuation, no ischemia;  meds were adjusted & started on Lasix20...    She saw PCP- [name] 10/31/17 & 07/31/17> notes reviewed- prediabetes, HL, obesity; she went to a Bariatric weight management program on [place] [facility] sponsored & too $$), not exercising, no weight loss; A1c=6.2, FLP looked good on Atorva40, Uric-4.6 on Allopurinol300; encouraged on diet & exercise...     No notes from Rheum-DrDeveshwar in Epic> but pt indicates that they have her on Provigil and Pamelor... We reviewed the following medical problems during today's office visit >>     Hx multifactorial dyspnea-- w/ OSA, mild pulm restriction from obesity, deconditioning, & "chest wall factors" all playing a roll; she has numerous specialty physicians tending to her needs;  She has been rec to get on diet, incr exercise lose  weight, etc; she needs ROV w/ her Sleep doc for follow up; we tried regular dosing of Alprazolam 0.25Tid but she didn't note any benefit to breathing...    OSA-- we were able to get old sleep study data, on CPAP-13, new machine in 2014 by [name], but cannot find any downloads & we've called [facility], asked pt to bring card to check download data...    OBESITY-- obviously DIET & EXERCISE are the keys, but due to her health status & inability to lose wt over many yrs- she should consider Bariatric surg.    Hx aspiration pneumonia-- this occurred in 2012 & resolved w/ appropriate antibiotic treatment; at risk w/ LPRD, GERD, ?gastroparesis (followed by GI in [facility]);  We reviewed antireflux regimen...    Known GERD/Gastroparesis & ?LPR-- she needs a vigorous antireflux regimen w/ Protonix40Bid, NPO after dinner, elev HOB 6", etc...    Hx RLS-- I assume this was a finding on her previous sleep eval, not on specific rx for this at present, & followed by Neuro...    Medical issues-- HL, DJD w/ R-TKR in 2016, Gout, FM/CFS, Migraines, ?Neuropathy, Anxiety/Depression...    Polypharmacy-- she would be well served to slowly wean off her mult meds... EXAM shows Afeb, VSS, O2sat=94% on RA;  232#, 5'3"Tall, BMI=41;  HEENT- neg, mallampati4, short/fat neck, parotid hypertrophy;  Chest- decr excursions, clear w/o w/r/r;  Cor- RR w/o m/r/g;  Abd- obese, soft, +pannuiculus;  Ext- neg w/o c/c/e;  Neuro- intact w/o focal deficits...  LAST CXR 12/06/17>  Norm heart size, obese w/ low lung volumes, mild basilar atx, otherw clear- NAD  CT Angio Chest 12/08/17>  NEG for PE, norm caliber ao & borderline heart size, no adenopathy, no pleural fluid, s/p GB, remote sternal  fx, NAD...  PFT 12/23/17>  FVC=2.44 (75%), FEV1=1.99 (80%), %1sec=82%, mid-flows=87% predicted;  No change in FEV1 after bronchodil;  TLC=3.75 (75%), RV=1.31 (66%), RV/TLC=35%;  DLCO=81% predicted;  This is all c/w mild pulm restrictive dis & no signif airflow  obstruction, norm DLCO...   Ambulatory Oximetry 12/26/17>  O2sat on RA at rest= 96%;  She ambulated 3 Laps (185'ea) on RA w/ lowest O2sat=96% w/ HR=101 (sats rose to 100% after 3laps w/ HR=120/min)... IMP/PLAN>>  Repeat pulm assessment confirms mild restrictive dis from obesity & essentially clear CXR/ scan w/o parenchymal lung dis; her dyspnea is clearly of a multifactorial nature & the main interventions that will yield improvement in her breathing down the road includes diet/ exercise/ wt reduction;  She has been unsuccessful w/ wt reduction on her own- suggest that she reconsider & look into Bariatric surg;  In the meanwhile she will work w/ her PCP regarding diet, and trying to wean down some of her meds... The other issue is her OSA- on CPAP originally prescribed by [name], now seeing DrTonuzi in [place] for Neuro but he is not addressing her OSA; [name] ordered her last machine and orders her supplies; we have asked pt to bring card into [facility] for download to review...   ADDENDUM>>  Pt brought her CPAP machine to [facility] & download indicates that she wasn't using the machine at all until 1/20, then still missing days for total 8 out last 15 days & only averaging 3h+ per night;  On CPCP13, mild air leaks, and AHI on CPAP of 2.4/hr... She is asked to identify her Sleep doctor & team- [name] did original sleep testing & Rx;  [name] ordered her most recent CPAP machine & her supplies;  Neurologist-DrTonuzi in [place] sees her for neuropathy but hasn't addressed her OSA;  She could always request Pulm/Sleep to take over monitoring her OSA going forward- she will let us know...    Past Medical History:  Diagnosis Date  . Anemia    previously followed by Dr. Jamse Arn for anemia and elevated platelets  . Anxiety   . C. difficile colitis 10/01/12   treated by [facility]  . Chronic fatigue syndrome   . DDD (degenerative disc disease), lumbar 08/19/14   and facet arthroplasty & left lumbar  radiculopathy (Dr.Ramos)  . Depression   . Dyssynergia    dyssynergenic defecation, contributing to fecal incontinence.  . Edema   . Fibromyalgia   . Gastroparesis    followed at [facility]  . GERD (gastroesophageal reflux disease)   . Hyperlipidemia   . Kidney stone   . Lumbar radiculopathy   . Migraine   . Neuropathy   . Obstructive sleep apnea    Does not wear CPAP  . Paresthesia    Dr. Everette Rank at [facility]  . Pelvic floor dysfunction    pelvic floor dyssynergy  . Plantar fasciitis 02/2011   R foot  . Pneumonia    2012  . PONV (postoperative nausea and vomiting)    pt states has gastroparesis has difficulty taking antibiotics and narcotics has severe nausea and vomiting   . Restless leg syndrome   . S/P endometrial ablation 08/09/2006   Novasure Ablation  . S/P epidural steroid injection 09/20/14   Dr.Ramos  . Tremor    Dr. Everette Rank  . Urinary frequency   . Urinary incontinence     Past Surgical History:  Procedure Laterality Date  . CHOLECYSTECTOMY  9/05  . ENDOMETRIAL ABLATION  08/09/2006  Dr. Valentina Shaggy Ablation  . FACET JOINT INJECTION  04/17/2017   Left L4-5 and L5-S1  . KNEE ARTHROPLASTY    . KNEE SURGERY  1999   R knee, Dr. Eddie Dibbles, torn cartilage  . TONSILLECTOMY  1968  . TONSILLECTOMY    . TOTAL KNEE ARTHROPLASTY Right 09/05/2015   Procedure: RIGHT TOTAL KNEE ARTHROPLASTY;  Surgeon: Gaynelle Arabian, MD;  Location: [facility];  Service: Orthopedics;  Laterality: Right;    Outpatient Encounter Medications as of 12/26/2017  Medication Sig  . albuterol (PROVENTIL HFA;VENTOLIN HFA) 108 (90 Base) MCG/ACT inhaler Inhale 1-2 puffs into the lungs every 6 (six) hours as needed for wheezing or shortness of breath. Reported on 05/30/2016  . allopurinol (ZYLOPRIM) 300 MG tablet Take 1 tablet (300 mg total) daily by mouth.  . ALPRAZolam (XANAX) 0.25 MG tablet Take 0.25 mg by mouth 3 (three) times daily as needed for anxiety.   . ARIPiprazole (ABILIFY) 5 MG tablet  Take 5 mg by mouth daily.  . Armodafinil 250 MG tablet TAKE 1 TABLET BY MOUTH WITH BREAKFAST (Patient taking differently: TAKE 250MG  BY MOUTH ONCE DAILY WITH BREAKFAST)  . atorvastatin (LIPITOR) 40 MG tablet Take 1 tablet (40 mg total) by mouth daily.  . cetirizine (ZYRTEC) 10 MG tablet Take 10 mg by mouth at bedtime.  . cholecalciferol (VITAMIN D) 1000 units tablet Take 1,000 Units by mouth daily.  . colchicine 0.6 MG tablet Take 2 tablets by mouth at onset of gout flare. Taken an additional tablet 1 hour later, if needed. May take like this every 3d, if needed  . DULoxetine (CYMBALTA) 60 MG capsule Take 60 mg by mouth 2 (two) times daily.   . fluticasone (FLONASE) 50 MCG/ACT nasal spray PLACE 2 SPRAYS INTO BOTH NOSTRILS DAILY.  . furosemide (LASIX) 20 MG tablet Take 1 tablet (20 mg total) by mouth daily.  . metoprolol tartrate (LOPRESSOR) 50 MG tablet Take 1 tablet (50 mg total) by mouth 2 (two) times daily.  . nortriptyline (PAMELOR) 25 MG capsule Take 75-100 mg by mouth at bedtime.  . pantoprazole (PROTONIX) 40 MG tablet Take 40 mg by mouth 2 (two) times daily.  . Probiotic Product ([place]) CAPS Take 1 capsule by mouth daily. Reported on 01/02/2016  . prochlorperazine (COMPAZINE) 10 MG tablet Take 10 mg by mouth every 6 (six) hours as needed for nausea or vomiting. Reported on 05/09/2016  . promethazine (PHENERGAN) 25 MG suppository Place 25 mg rectally every 6 (six) hours as needed for nausea or vomiting. Reported on 05/09/2016  . Propylene Glycol (SYSTANE COMPLETE) 0.6 % SOLN Place 1 drop into both eyes 4 (four) times daily.  . ranitidine (ZANTAC) 300 MG tablet Take 300 mg by mouth 2 (two) times daily.   Marland Kitchen topiramate (TOPAMAX) 25 MG tablet Take 2 tablets (50 mg total) by mouth at bedtime.  . [DISCONTINUED] carbamazepine (TEGRETOL XR) 100 MG 12 hr tablet Take 200 mg by mouth daily.    . [DISCONTINUED] Eszopiclone 3 MG TABS Take 3 mg by mouth at bedtime. Take immediately before  bedtime  . [DISCONTINUED] Multiple Vitamins-Minerals (PRESERVISION AREDS PO) Take 1 capsule by mouth 2 (two) times daily. Reported on 01/02/2016   No facility-administered encounter medications on file as of 12/26/2017.     Allergies  Allergen Reactions  . Erythromycin Nausea Only  . Tramadol Itching  . Dilaudid [Hydromorphone Hcl] Itching    Immunization History  Administered Date(s) Administered  . Influenza Split 09/17/2011, 09/03/2012, 09/04/2013  . Influenza,inj,Quad PF,6+  Mos 08/16/2014, 07/27/2015  . Influenza-Unspecified 10/04/2016, 07/31/2017  . Tdap 01/16/2011, 06/20/2017    Current Medications, Allergies, Past Medical History, Past Surgical History, Family History, and Social History were reviewed in [employer] record.   Review of Systems             All symptoms NEG except where BOLDED >>  Constitutional:  F/C/S, fatigue, anorexia, unexpected weight change. HEENT:  HA, visual changes, hearing loss, earache, nasal symptoms, sore throat, mouth sores, hoarseness. Resp:  cough, sputum, hemoptysis; SOB, tightness, wheezing. Cardio:  CP, palpit, DOE, orthopnea, edema. GI:  N/V/D/C, blood in stool; reflux, abd pain, distention, gas. GU:  dysuria, freq, urgency, hematuria, flank pain, voiding difficulty. MS:  joint pain, swelling, tenderness, decr ROM; neck pain, back pain, etc. Neuro:  HA, tremors, seizures, dizziness, syncope, weakness, numbness, gait abn. Skin:  suspicious lesions or skin rash. Heme:  adenopathy, bruising, bleeding. Psyche:  confusion, agitation, sleep disturbance, hallucinations, anxiety, depression suicidal.   Objective:   Physical Exam       Vital Signs:  Reviewed...   General:  WD, obese, 62 y/o WM in NAD; alert & oriented; pleasant & cooperative... HEENT:  [place]/AT; Conjunctiva- pink, Sclera- nonicteric, EOM-wnl, PERRLA; EACs-clear, TMs-wnl; NOSE-clear; THROAT-clear & wnl.  Neck:  Supple w/ fair ROM; no JVD; normal carotid  impulses w/o bruits; no thyromegaly or nodules palpated; no lymphadenopathy.  Chest:  Decr BS at bases, clear to P & A; without wheezes, rales, or rhonchi heard. Heart:  Regular Rhythm; norm S1 & S2 without murmurs, rubs, or gallops detected. Abdomen:  Obese, soft & nontender- no guarding or rebound; normal bowel sounds; no organomegaly or masses palpated. Ext:  Decr ROM; without deformities +arthritic changes; no varicose veins, +venous insuffic, tr edema;  Pulses intact w/o bruits. Neuro:  CNs II-XII intact; motor testing normal; sensory testing normal; gait abnormal & balance is fair. Derm:  No lesions noted; no rash etc. Lymph:  No cervical, supraclavicular, axillary, or inguinal adenopathy palpated.  Assessment:      IMP>>    Hx multifactorial dyspnea-- w/ OSA, mild pulm restriction from obesity, deconditioning, & "chest wall factors" all playing a roll; she has numerous specialty physicians tending to her needs;  She has been rec to get on diet, incr exercise lose weight, etc; she needs ROV w/ her Sleep doc for follow up; we tried regular dosing of Alprazolam 0.25Tid but she didn't note any benefit to breathing...    OSA-- we were able to get old sleep study data, on CPAP-13, new machine in 2014 by [name], but cannot find any downloads & we've called [facility], asked pt to bring card to check download data...    OBESITY-- obviously DIET & EXERCISE are the keys, but due to her health status & inability to lose wt over many yrs- she should consider Bariatric surg.    Hx aspiration pneumonia-- this occurred in 2012 & resolved w/ appropriate antibiotic treatment; at risk w/ LPRD, GERD, ?gastroparesis (followed by GI in [facility]);  We reviewed antireflux regimen...    Known GERD/Gastroparesis & ?LPR-- she needs a vigorous antireflux regimen w/ Protonix40Bid, NPO after dinner, elev HOB 6", etc...    Hx RLS-- I assume this was a finding on her previous sleep eval, not on specific rx for this at  present, & followed by Neuro...    Medical issues-- HL, DJD w/ R-TKR in 2016, Gout, FM/CFS, Migraines, ?Neuropathy, Anxiety/Depression...    Polypharmacy-- she would be well served  to slowly wean off her mult meds...  PLAN>>   01/25/16>   Britany presents w/ multifactorial dyspnea and I explained that there is no easy/ quick fix;  The good news is that herCXR, PFT & oxygenaton are normal indicating that her lungs are healthy;  To help her w/ the dyspnea she needs to do the following:  1)  Must lose weight- diet & exercise are the keys but Bariatric surg would be a reasonable approach if she has been unable to successfully lose wt in the pat;  2) she must improve her conditioning- this requires a regular exercise program & she cannot do this on her own, needs help of family/ trainer/ etc & suggested to join the [employer]/ [employer] sme[employer]/ etc;  3)  For the chest wall factor-- I find that Alprazolam/ Klonopin are the best meds to allow a deeper breath & help the pt feel more satisfied breathing, & it may take a dose adjustment to help maximally;  4)  Must follow a vigorous antireflux regimen-- Protonix40Bid, NPO after dinner, elevate HOB 6"... 06/19/17>   I stressed to [place] the importance of diet/ exercise/ wt reduction; she desperately needs to lose the weight & start moving; I think it would e helpful to talk to the Bariatric surgeons about her situation, but she wants to clear it w/ her gastroenterologists first... She will maintain her vigorous antireflux regimen, and try to incr the Alprazolam as a trial to 0.25mg  Bid-Tid to see if this helps her breathing (allowing her to get a better, deeper breath & feel more satisfied breathing... We refilled her ProairHFA rescue inhaler per her request. 12/26/17>   Repeat pulm assessment confirms mild restrictive dis from obesity & essentially clear CXR/ scan w/o parenchymal lung dis; her dyspnea is clearly of a multifactorial nature & the main interventions that will yield  improvement in her breathing down the road includes diet/ exercise/ wt reduction;  She has been unsuccessful w/ wt reduction on her own- suggest that she reconsider & look into Bariatric surg;  In the meanwhile she will work w/ her PCP regarding diet, and trying to wean down some of her meds... The other issue is her OSA- on CPAP originally prescribed by [name], now seeing DrTonuzi in [place] for Neuro but he is not addressing her OSA; [name] ordered her last machine and orders her supplies; we have asked pt to bring card into [facility] for download to review   Plan:     Patient's Medications  New Prescriptions   No medications on file  Previous Medications   ALBUTEROL (PROVENTIL HFA;VENTOLIN HFA) 108 (90 BASE) MCG/ACT INHALER    Inhale 1-2 puffs into the lungs every 6 (six) hours as needed for wheezing or shortness of breath. Reported on 05/30/2016   ALLOPURINOL (ZYLOPRIM) 300 MG TABLET    Take 1 tablet (300 mg total) daily by mouth.   ALPRAZOLAM (XANAX) 0.25 MG TABLET    Take 0.25 mg by mouth 3 (three) times daily as needed for anxiety.    ARIPIPRAZOLE (ABILIFY) 5 MG TABLET    Take 5 mg by mouth daily.   ARMODAFINIL 250 MG TABLET    TAKE 1 TABLET BY MOUTH WITH BREAKFAST   ATORVASTATIN (LIPITOR) 40 MG TABLET    Take 1 tablet (40 mg total) by mouth daily.   CETIRIZINE (ZYRTEC) 10 MG TABLET    Take 10 mg by mouth at bedtime.   CHOLECALCIFEROL (VITAMIN D) 1000 UNITS TABLET    Take 1,000  Units by mouth daily.   COLCHICINE 0.6 MG TABLET    Take 2 tablets by mouth at onset of gout flare. Taken an additional tablet 1 hour later, if needed. May take like this every 3d, if needed   DULOXETINE (CYMBALTA) 60 MG CAPSULE    Take 60 mg by mouth 2 (two) times daily.    FLUTICASONE (FLONASE) 50 MCG/ACT NASAL SPRAY    PLACE 2 SPRAYS INTO BOTH NOSTRILS DAILY.   FUROSEMIDE (LASIX) 20 MG TABLET    Take 1 tablet (20 mg total) by mouth daily.   METOPROLOL TARTRATE (LOPRESSOR) 50 MG TABLET    Take 1 tablet (50 mg  total) by mouth 2 (two) times daily.   NORTRIPTYLINE (PAMELOR) 25 MG CAPSULE    Take 75-100 mg by mouth at bedtime.   PANTOPRAZOLE (PROTONIX) 40 MG TABLET    Take 40 mg by mouth 2 (two) times daily.   PROBIOTIC PRODUCT (PHILLIPS COLON HEALTH) CAPS    Take 1 capsule by mouth daily. Reported on 01/02/2016   PROCHLORPERAZINE (COMPAZINE) 10 MG TABLET    Take 10 mg by mouth every 6 (six) hours as needed for nausea or vomiting. Reported on 05/09/2016   PROMETHAZINE (PHENERGAN) 25 MG SUPPOSITORY    Place 25 mg rectally every 6 (six) hours as needed for nausea or vomiting. Reported on 05/09/2016   PROPYLENE GLYCOL (SYSTANE COMPLETE) 0.6 % SOLN    Place 1 drop into both eyes 4 (four) times daily.   RANITIDINE (ZANTAC) 300 MG TABLET    Take 300 mg by mouth 2 (two) times daily.    TOPIRAMATE (TOPAMAX) 25 MG TABLET    Take 2 tablets (50 mg total) by mouth at bedtime.  Modified Medications   No medications on file  Discontinued Medications   MULTIPLE VITAMINS-MINERALS (PRESERVISION AREDS PO)    Take 1 capsule by mouth 2 (two) times daily. Reported on 01/02/2016

## 2017-12-26 NOTE — Telephone Encounter (Signed)
Per SN: We are waiting on [facility] to fax [place] over her CPAP download. She has to bring in the SD card for them to download info.   [facility] # [phone]

## 2017-12-27 ENCOUNTER — Ambulatory Visit: Payer: 59 | Admitting: Cardiovascular Disease

## 2017-12-27 ENCOUNTER — Other Ambulatory Visit: Payer: Self-pay | Admitting: Family Medicine

## 2017-12-27 ENCOUNTER — Telehealth: Payer: Self-pay | Admitting: Cardiovascular Disease

## 2017-12-27 ENCOUNTER — Encounter: Payer: Self-pay | Admitting: Cardiovascular Disease

## 2017-12-27 VITALS — BP 128/78 | HR 101 | Ht 62.0 in | Wt 231.5 lb

## 2017-12-27 DIAGNOSIS — R0602 Shortness of breath: Secondary | ICD-10-CM

## 2017-12-27 NOTE — Patient Instructions (Addendum)
Medication Instructions:  Your physician recommends that you continue on your current medications as directed. Please refer to the Current Medication list given to you today.  Labwork: Your physician recommends that you have lab work today. BMET, CBC, PT/INR  Testing/Procedures: Your physician has requested that you have a cardiac catheterization. Cardiac catheterization is used to diagnose and/or treat various heart conditions. Doctors may recommend this procedure for a number of different reasons. The most common reason is to evaluate chest pain. Chest pain can be a symptom of coronary artery disease (CAD), and cardiac catheterization can show whether plaque is narrowing or blocking your heart's arteries. This procedure is also used to evaluate the valves, as well as measure the blood flow and oxygen levels in different parts of your heart. For further information please visit [email or web address]. Please follow instruction sheet, as given.  Follow-Up: Your physician wants you to follow-up in: 2 weeks with Dr. Johnsie Cancel.  If you need a refill on your cardiac medications before your next appointment, please call your pharmacy.    [place] OFF[facility] [address] [place] [address] [place] [address] Dept: [phone] Loc: [phone]  [name]  12/27/2017  You are scheduled for a Cardiac Catheterization on Wednesday, February 6 with Dr. Sherren Mocha.  1. Please arrive at the [facility] (Main Entrance A) at [facility]: [address] [place], [place] [address] at 12:30 PM (two hours before your procedure to ensure your preparation). Free valet parking service is available.   Special note: Every effort is made to have your procedure done on time. Please understand that emergencies sometimes delay scheduled procedures.  2. Diet: Do not eat or drink anything after midnight prior to your procedure except  sips of water to take medications.  3. Labs: None needed.  4. Medication instructions in preparation for your procedure:  Stop taking furosemide (lasix) on Wednesday, February 6.    On the morning of your procedure, take your Aspirin 81 mg and any morning medicines NOT listed above.  You may use sips of water.  5. Plan for one night stay--bring personal belongings. 6. Bring a current list of your medications and current insurance cards. 7. You MUST have a responsible person to drive you home. 8. Someone MUST be with you the first 24 hours after you arrive home or your discharge will be delayed. 9. Please wear clothes that are easy to get on and off and wear slip-on shoes.  Thank you for allowing [place] to care for you!   -- [facility]

## 2017-12-27 NOTE — Telephone Encounter (Signed)
Pt decided to keep her current Cath appointment.

## 2017-12-27 NOTE — Telephone Encounter (Signed)
New message     Patient calling to reschedule heart cath. Please call

## 2017-12-27 NOTE — Telephone Encounter (Signed)
Pt has not dropped CPAP off at [facility] for download. I reminded her how important it was to get this download before her heart cath next week.

## 2017-12-28 LAB — CBC WITH DIFFERENTIAL/PLATELET
Basophils Absolute: 0.1 10*3/uL (ref 0.0–0.2)
Basos: 1 %
EOS (ABSOLUTE): 0.3 10*3/uL (ref 0.0–0.4)
Eos: 3 %
Hematocrit: 37.6 % (ref 34.0–46.6)
Hemoglobin: 12.3 g/dL (ref 11.1–15.9)
Immature Grans (Abs): 0.1 10*3/uL (ref 0.0–0.1)
Immature Granulocytes: 1 %
Lymphocytes Absolute: 1.9 10*3/uL (ref 0.7–3.1)
Lymphs: 18 %
MCH: 29.8 pg (ref 26.6–33.0)
MCHC: 32.7 g/dL (ref 31.5–35.7)
MCV: 91 fL (ref 79–97)
Monocytes Absolute: 0.9 10*3/uL (ref 0.1–0.9)
Monocytes: 9 %
Neutrophils Absolute: 7.1 10*3/uL — ABNORMAL HIGH (ref 1.4–7.0)
Neutrophils: 68 %
Platelets: 377 10*3/uL (ref 150–379)
RBC: 4.13 x10E6/uL (ref 3.77–5.28)
RDW: 15.2 % (ref 12.3–15.4)
WBC: 10.3 10*3/uL (ref 3.4–10.8)

## 2017-12-28 LAB — BASIC METABOLIC PANEL
BUN/Creatinine Ratio: 22 (ref 12–28)
BUN: 20 mg/dL (ref 8–27)
CO2: 22 mmol/L (ref 20–29)
Calcium: 8.8 mg/dL (ref 8.7–10.3)
Chloride: 101 mmol/L (ref 96–106)
Creatinine, Ser: 0.89 mg/dL (ref 0.57–1.00)
GFR calc Af Amer: 81 mL/min/{1.73_m2} (ref >59)
GFR calc non Af Amer: 71 mL/min/{1.73_m2} (ref >59)
Glucose: 147 mg/dL — ABNORMAL HIGH (ref 65–99)
Potassium: 4.3 mmol/L (ref 3.5–5.2)
Sodium: 140 mmol/L (ref 134–144)

## 2017-12-28 LAB — PROTIME-INR
INR: 1 (ref 0.8–1.2)
Prothrombin Time: 10.4 s (ref 9.1–12.0)

## 2017-12-29 ENCOUNTER — Encounter: Payer: Self-pay | Admitting: Pulmonary Disease

## 2017-12-30 ENCOUNTER — Other Ambulatory Visit (HOSPITAL_COMMUNITY): Payer: Self-pay | Admitting: Cardiovascular Disease

## 2017-12-30 ENCOUNTER — Telehealth: Payer: Self-pay | Admitting: *Deleted

## 2017-12-30 DIAGNOSIS — R0600 Dyspnea, unspecified: Secondary | ICD-10-CM

## 2017-12-30 NOTE — Telephone Encounter (Signed)
Patient contacted pre-catheterization at [facility] scheduled for: Wednesday January 01, 2018 2:30 PM Verified arrival time and place: [facility] A/ NT 12:30 PM Pt advised she can have LIGHT breakfast until 7:30 AM, then nothing to eat or drink except medications with sips of water. Confirmed allergies listed in Epic. Confirmed no diabetes.   Confirmed HOLD medications; Lasix am of cath  Except HOLD medications confirmed all other AM meds to be taken pre-cath with sip of water including: ASA 81 mg  Confirmed patient has responsible person to drive home post procedure and observe patient for 24 hours: yes

## 2017-12-30 NOTE — Telephone Encounter (Signed)
Spoke to [facility] and they have a download which they are faxing to our office right now. Will follow until I receive the fax.

## 2017-12-30 NOTE — Telephone Encounter (Signed)
I left message to call back for pt to discuss cath instructions.  Right and Left Heart Cath scheduled for 01/01/2018 2:30 PM. Arrival time and place: [facility] A/NT 12:30 PM Confirm LIGHT breakfast until 7:30 AM, then nothing to eat or drink except medications with sips of water.  Confirm HOLD lasix AM of cath.  Confirm all other AM meds to be taken pre-cath with sip of water including: ASA 81 mg   Confirm patient has responsible person to drive home post procedure and observe patient for 24 hours:

## 2017-12-31 NOTE — Telephone Encounter (Signed)
SN received download from CPAP. Pt would like cardiology to take over managing her CPAP and supplies.   Nothing further needed.

## 2018-01-01 ENCOUNTER — Ambulatory Visit (HOSPITAL_COMMUNITY)
Admission: RE | Disposition: A | Discharge status: Home / Self Care | Payer: Self-pay | Source: Ambulatory Visit | Attending: Cardiovascular Disease

## 2018-01-01 ENCOUNTER — Ambulatory Visit (HOSPITAL_COMMUNITY)
Admission: RE | Admit: 2018-01-01 | Discharge: 2018-01-01 | Disposition: A | Discharge status: Home / Self Care | Payer: 59 | Source: Ambulatory Visit | Attending: Cardiovascular Disease | Admitting: Cardiovascular Disease

## 2018-01-01 DIAGNOSIS — K219 Gastro-esophageal reflux disease without esophagitis: Secondary | ICD-10-CM | POA: Diagnosis not present

## 2018-01-01 DIAGNOSIS — K3184 Gastroparesis: Secondary | ICD-10-CM | POA: Diagnosis not present

## 2018-01-01 DIAGNOSIS — Z96651 Presence of right artificial knee joint: Secondary | ICD-10-CM | POA: Diagnosis not present

## 2018-01-01 DIAGNOSIS — R5382 Chronic fatigue, unspecified: Secondary | ICD-10-CM | POA: Insufficient documentation

## 2018-01-01 DIAGNOSIS — M797 Fibromyalgia: Secondary | ICD-10-CM | POA: Insufficient documentation

## 2018-01-01 DIAGNOSIS — G43909 Migraine, unspecified, not intractable, without status migrainosus: Secondary | ICD-10-CM | POA: Diagnosis not present

## 2018-01-01 DIAGNOSIS — G629 Polyneuropathy, unspecified: Secondary | ICD-10-CM | POA: Insufficient documentation

## 2018-01-01 DIAGNOSIS — R079 Chest pain, unspecified: Secondary | ICD-10-CM | POA: Diagnosis not present

## 2018-01-01 DIAGNOSIS — R0602 Shortness of breath: Secondary | ICD-10-CM | POA: Diagnosis not present

## 2018-01-01 DIAGNOSIS — Z7951 Long term (current) use of inhaled steroids: Secondary | ICD-10-CM | POA: Insufficient documentation

## 2018-01-01 DIAGNOSIS — Z79899 Other long term (current) drug therapy: Secondary | ICD-10-CM | POA: Insufficient documentation

## 2018-01-01 DIAGNOSIS — I272 Pulmonary hypertension, unspecified: Secondary | ICD-10-CM | POA: Insufficient documentation

## 2018-01-01 DIAGNOSIS — R0600 Dyspnea, unspecified: Secondary | ICD-10-CM

## 2018-01-01 DIAGNOSIS — F329 Major depressive disorder, single episode, unspecified: Secondary | ICD-10-CM | POA: Diagnosis not present

## 2018-01-01 DIAGNOSIS — G4733 Obstructive sleep apnea (adult) (pediatric): Secondary | ICD-10-CM | POA: Diagnosis not present

## 2018-01-01 DIAGNOSIS — Z8249 Family history of ischemic heart disease and other diseases of the circulatory system: Secondary | ICD-10-CM | POA: Insufficient documentation

## 2018-01-01 DIAGNOSIS — E785 Hyperlipidemia, unspecified: Secondary | ICD-10-CM | POA: Insufficient documentation

## 2018-01-01 DIAGNOSIS — F419 Anxiety disorder, unspecified: Secondary | ICD-10-CM | POA: Insufficient documentation

## 2018-01-01 HISTORY — PX: RIGHT/LEFT HEART CATH AND CORONARY ANGIOGRAPHY: CATH118266

## 2018-01-01 HISTORY — PX: ULTRASOUND GUIDANCE FOR VASCULAR ACCESS: SHX6516

## 2018-01-01 LAB — POCT I-STAT 3, ART BLOOD GAS (G3+)
Acid-base deficit: 2 mmol/L (ref 0.0–2.0)
Bicarbonate: 23.5 mmol/L (ref 20.0–28.0)
O2 Saturation: 98 %
TCO2: 25 mmol/L (ref 22–32)
pCO2 arterial: 42.3 mmHg (ref 32.0–48.0)
pH, Arterial: 7.353 (ref 7.350–7.450)
pO2, Arterial: 108 mmHg (ref 83.0–108.0)

## 2018-01-01 LAB — POCT I-STAT 3, VENOUS BLOOD GAS (G3P V)
Acid-base deficit: 2 mmol/L (ref 0.0–2.0)
Bicarbonate: 24 mmol/L (ref 20.0–28.0)
O2 Saturation: 68 %
TCO2: 25 mmol/L (ref 22–32)
pCO2, Ven: 47.3 mmHg (ref 44.0–60.0)
pH, Ven: 7.314 (ref 7.250–7.430)
pO2, Ven: 39 mmHg (ref 32.0–45.0)

## 2018-01-01 SURGERY — RIGHT/LEFT HEART CATH AND CORONARY ANGIOGRAPHY
Anesthesia: LOCAL

## 2018-01-01 MED ORDER — FENTANYL CITRATE (PF) 100 MCG/2ML IJ SOLN
INTRAMUSCULAR | Status: DC | PRN
  Administered 2018-01-01 (×2): 25 ug via INTRAVENOUS

## 2018-01-01 MED ORDER — HEPARIN (PORCINE) IN NACL 2-0.9 UNIT/ML-% IJ SOLN
INTRAMUSCULAR | Status: AC | PRN
  Administered 2018-01-01 (×2): 500 mL

## 2018-01-01 MED ORDER — MIDAZOLAM HCL 2 MG/2ML IJ SOLN
INTRAMUSCULAR | Status: AC
  Filled 2018-01-01: qty 2

## 2018-01-01 MED ORDER — SODIUM CHLORIDE 0.9 % IV SOLN: 250.0000 mL | INTRAVENOUS | Status: DC | PRN

## 2018-01-01 MED ORDER — HEPARIN SODIUM (PORCINE) 1000 UNIT/ML IJ SOLN
INTRAMUSCULAR | Status: DC | PRN
  Administered 2018-01-01: 5000 [IU] via INTRAVENOUS

## 2018-01-01 MED ORDER — HEPARIN (PORCINE) IN NACL 2-0.9 UNIT/ML-% IJ SOLN
INTRAMUSCULAR | Status: AC
  Filled 2018-01-01: qty 1000

## 2018-01-01 MED ORDER — SODIUM CHLORIDE 0.9% FLUSH: 3.0000 mL | INTRAVENOUS | Status: DC | PRN

## 2018-01-01 MED ORDER — SODIUM CHLORIDE 0.9 % IV SOLN: INTRAVENOUS | Status: DC

## 2018-01-01 MED ORDER — SODIUM CHLORIDE 0.9% FLUSH: 3.0000 mL | Freq: Two times a day (BID) | INTRAVENOUS | Status: DC

## 2018-01-01 MED ORDER — IOHEXOL 350 MG/ML SOLN
INTRAVENOUS | Status: DC | PRN
  Administered 2018-01-01: 40 mL via INTRAVENOUS

## 2018-01-01 MED ORDER — HEPARIN SODIUM (PORCINE) 1000 UNIT/ML IJ SOLN
INTRAMUSCULAR | Status: AC
  Filled 2018-01-01: qty 1

## 2018-01-01 MED ORDER — LIDOCAINE HCL (PF) 1 % IJ SOLN
INTRAMUSCULAR | Status: DC | PRN
  Administered 2018-01-01: 5 mL

## 2018-01-01 MED ORDER — VERAPAMIL HCL 2.5 MG/ML IV SOLN
INTRAVENOUS | Status: AC
  Filled 2018-01-01: qty 2

## 2018-01-01 MED ORDER — ASPIRIN 81 MG PO CHEW: 81.0000 mg | CHEWABLE_TABLET | ORAL | Status: DC

## 2018-01-01 MED ORDER — HEPARIN (PORCINE) IN NACL 2-0.9 UNIT/ML-% IJ SOLN
INTRAMUSCULAR | Status: DC | PRN
  Administered 2018-01-01: 10 mL via INTRA_ARTERIAL

## 2018-01-01 MED ORDER — SODIUM CHLORIDE 0.9 % WEIGHT BASED INFUSION
3.0000 mL/kg/h | INTRAVENOUS | Status: AC
  Administered 2018-01-01: 3 mL/kg/h via INTRAVENOUS

## 2018-01-01 MED ORDER — FENTANYL CITRATE (PF) 100 MCG/2ML IJ SOLN
INTRAMUSCULAR | Status: AC
  Filled 2018-01-01: qty 2

## 2018-01-01 MED ORDER — MIDAZOLAM HCL 2 MG/2ML IJ SOLN
INTRAMUSCULAR | Status: DC | PRN
  Administered 2018-01-01 (×2): 1 mg via INTRAVENOUS

## 2018-01-01 MED ORDER — LIDOCAINE HCL (PF) 1 % IJ SOLN
INTRAMUSCULAR | Status: AC
  Filled 2018-01-01: qty 30

## 2018-01-01 MED ORDER — SODIUM CHLORIDE 0.9 % WEIGHT BASED INFUSION: 1.0000 mL/kg/h | INTRAVENOUS | Status: DC

## 2018-01-01 SURGICAL SUPPLY — 13 items
CATH 5FR JL3.5 JR4 ANG PIG MP (CATHETERS) ×1 IMPLANT
CATH BALLN WEDGE 5F 110CM (CATHETERS) ×1 IMPLANT
COVER PRB 48X5XTLSCP FOLD TPE (BAG) IMPLANT
COVER PROBE 5X48 (BAG) ×3
DEVICE RAD COMP TR BAND LRG (VASCULAR PRODUCTS) ×1 IMPLANT
GLIDESHEATH SLEND SS 6F .021 (SHEATH) ×1 IMPLANT
GUIDEWIRE INQWIRE 1.5J.035X260 (WIRE) IMPLANT
INQWIRE 1.5J .035X260CM (WIRE) ×3
KIT HEART LEFT (KITS) ×3 IMPLANT
PACK CARDIAC CATHETERIZATION (CUSTOM PROCEDURE TRAY) ×3 IMPLANT
SHEATH GLIDE SLENDER 4/5FR (SHEATH) ×1 IMPLANT
TRANSDUCER W/STOPCOCK (MISCELLANEOUS) ×3 IMPLANT
TUBING CIL FLEX 10 FLL-RA (TUBING) ×3 IMPLANT

## 2018-01-01 NOTE — Interval H&P Note (Signed)
History and Physical Interval Note:  01/01/2018 2:08 PM  [name]  has presented today for surgery, with the diagnosis of dypsnea  The various methods of treatment have been discussed with the patient and family. After consideration of risks, benefits and other options for treatment, the patient has consented to  Procedure(s): RIGHT/LEFT HEART CATH AND CORONARY ANGIOGRAPHY (N/A) as a surgical intervention .  The patient's history has been reviewed, patient examined, no change in status, stable for surgery.  I have reviewed the patient's chart and labs.  Questions were answered to the patient's satisfaction.     Sherren Mocha

## 2018-01-01 NOTE — Discharge Instructions (Signed)

## 2018-01-02 ENCOUNTER — Encounter (HOSPITAL_COMMUNITY): Payer: Self-pay | Admitting: Cardiovascular Disease

## 2018-01-02 MED FILL — Heparin Sodium (Porcine) 2 Unit/ML in Sodium Chloride 0.9%: INTRAMUSCULAR | Qty: 1000 | Status: AC

## 2018-01-07 ENCOUNTER — Telehealth: Payer: Self-pay | Admitting: Cardiovascular Disease

## 2018-01-07 NOTE — Telephone Encounter (Signed)
im ok with seeing her

## 2018-01-07 NOTE — Telephone Encounter (Signed)
Patient would like to see Dr. Radford Pax for sleep apnea. Patient is currently using a CPAP and stated she has not been followed up by any sleep doctor. Patient stated her last sleep study was years ago. Will forward to Dr. Johnsie Cancel to see if we can get a referral to Dr. Radford Pax.

## 2018-01-07 NOTE — Telephone Encounter (Signed)
That's fine

## 2018-01-07 NOTE — Telephone Encounter (Signed)
New message  Pt verbalized that she is calling for the RN  She said that she want to be referred to someone to help her with her sleep

## 2018-01-09 NOTE — Telephone Encounter (Signed)
Scheduled for 03/03/18 for appt with DrTurner to establish sleep and her sleep study is in Epic under the 10 year tab.

## 2018-01-15 NOTE — Progress Notes (Signed)
Cardiology Office Note   Date:  01/24/2018   ID:  [name], DOB 09/22/1956, MRN [id number]  PCP:  Rita Ohara, MD  Cardiologist:  Dr. Johnsie Cancel     No chief complaint on file.     History of Present Illness:  62 y.o. Obese female. Chronic medical issues include Fibromyalgia, gastroparesis , Anxiety and HLD OSA on CPAP And has appointment to be established with Dr Radford Pax. 12/06/17 admitted with chest pain with negative CTA for PE and normal nuclear study EF 63% She had bad visit with pulmonary who indicated her dyspnea and symptoms from obesity  Right and left cath done 01/01/18 Normal Cors EF 65% normal LVEDP PA pressure 41/11 mmHg PCWP 10 mmHg  She is more comfortable now indicating that her symptoms may be from her weight. Interested In bariatric referral  Past Medical History:  Diagnosis Date  . Anemia    previously followed by Dr. Jamse Arn for anemia and elevated platelets  . Anxiety   . C. difficile colitis 10/01/12   treated by [facility]  . Chronic fatigue syndrome   . DDD (degenerative disc disease), lumbar 08/19/14   and facet arthroplasty & left lumbar radiculopathy (Dr.Ramos)  . Depression   . Dyssynergia    dyssynergenic defecation, contributing to fecal incontinence.  . Edema   . Fibromyalgia   . Gastroparesis    followed at [facility]  . GERD (gastroesophageal reflux disease)   . Hyperlipidemia   . Kidney stone   . Lumbar radiculopathy   . Migraine   . Neuropathy   . Obstructive sleep apnea    Does not wear CPAP  . Paresthesia    Dr. Everette Rank at [facility]  . Pelvic floor dysfunction    pelvic floor dyssynergy  . Plantar fasciitis 02/2011   R foot  . Pneumonia    2012  . PONV (postoperative nausea and vomiting)    pt states has gastroparesis has difficulty taking antibiotics and narcotics has severe nausea and vomiting   . Restless leg syndrome   . S/P endometrial ablation 08/09/2006   Novasure Ablation  . S/P epidural steroid injection 09/20/14     Dr.Ramos  . Tremor    Dr. Everette Rank  . Urinary frequency   . Urinary incontinence     Past Surgical History:  Procedure Laterality Date  . CHOLECYSTECTOMY  9/05  . ENDOMETRIAL ABLATION  08/09/2006   Dr. Valentina Shaggy Ablation  . FACET JOINT INJECTION  04/17/2017   Left L4-5 and L5-S1  . KNEE ARTHROPLASTY    . KNEE SURGERY  1999   R knee, Dr. Eddie Dibbles, torn cartilage  . RIGHT/LEFT HEART CATH AND CORONARY ANGIOGRAPHY N/A 01/01/2018   Procedure: RIGHT/LEFT HEART CATH AND CORONARY ANGIOGRAPHY;  Surgeon: Sherren Mocha, MD;  Location: [facility];  Service: Cardiovascular;  Laterality: N/A;  . TONSILLECTOMY  1968  . TONSILLECTOMY    . TOTAL KNEE ARTHROPLASTY Right 09/05/2015   Procedure: RIGHT TOTAL KNEE ARTHROPLASTY;  Surgeon: Gaynelle Arabian, MD;  Location: [facility];  Service: Orthopedics;  Laterality: Right;  . ULTRASOUND GUIDANCE FOR VASCULAR ACCESS  01/01/2018   Procedure: Ultrasound Guidance For Vascular Access;  Surgeon: Sherren Mocha, MD;  Location: [facility];  Service: Cardiovascular;;     Current Outpatient Medications  Medication Sig Dispense Refill  . albuterol (PROVENTIL HFA;VENTOLIN HFA) 108 (90 Base) MCG/ACT inhaler Inhale 1-2 puffs into the lungs every 6 (six) hours as needed for wheezing or shortness of breath. Reported on 05/30/2016  1 Inhaler 3  . allopurinol (ZYLOPRIM) 300 MG tablet TAKE 1 TABLET (300 MG TOTAL) DAILY BY MOUTH. 90 tablet 0  . ALPRAZolam (XANAX) 0.25 MG tablet Take 0.25 mg by mouth 3 (three) times daily as needed for anxiety.     . ARIPiprazole (ABILIFY) 5 MG tablet Take 5 mg by mouth daily.  5  . Armodafinil 250 MG tablet Take 250 mg by mouth daily.    Marland Kitchen aspirin-acetaminophen-caffeine (EXCEDRIN MIGRAINE) 250-250-65 MG tablet Take 1-2 tablets by mouth every 6 (six) hours as needed for headache.    Marland Kitchen atorvastatin (LIPITOR) 40 MG tablet Take 1 tablet (40 mg total) by mouth daily. 90 tablet 0  . cetirizine (ZYRTEC) 10 MG tablet Take 10 mg by  mouth at bedtime.    . cholecalciferol (VITAMIN D) 1000 units tablet Take 1,000 Units by mouth daily.    . colchicine 0.6 MG tablet Take 2 tablets by mouth at onset of gout flare. Taken an additional tablet 1 hour later, if needed. May take like this every 3d, if needed 30 tablet 0  . DULoxetine (CYMBALTA) 60 MG capsule Take 60 mg by mouth 2 (two) times daily.     . fluticasone (FLONASE) 50 MCG/ACT nasal spray Place 2 sprays into both nostrils daily. Prn for allergies    . furosemide (LASIX) 20 MG tablet Take 1 tablet (20 mg total) by mouth daily. 30 tablet 11  . methocarbamol (ROBAXIN) 500 MG tablet TAKE 1 TABLET AT 7AM AND AT 2PM 60 tablet 0  . metoprolol tartrate (LOPRESSOR) 50 MG tablet Take 1 tablet (50 mg total) by mouth 2 (two) times daily. 180 tablet 3  . Multiple Vitamins-Minerals (ICAPS AREDS 2) CAPS Take 1 capsule by mouth 2 (two) times daily.    . nortriptyline (PAMELOR) 25 MG capsule Take 75-100 mg by mouth at bedtime.    . ondansetron (ZOFRAN) 8 MG tablet Take 8 mg by mouth every 8 (eight) hours as needed for nausea or vomiting.    . pantoprazole (PROTONIX) 40 MG tablet Take 40 mg by mouth 2 (two) times daily.    . Probiotic Product (Yantis) CAPS Take 1 capsule by mouth daily. Reported on 01/02/2016    . prochlorperazine (COMPAZINE) 10 MG tablet Take 10 mg by mouth every 6 (six) hours as needed for nausea or vomiting. Reported on 05/09/2016    . promethazine (PHENERGAN) 25 MG suppository Place 25 mg rectally every 6 (six) hours as needed for nausea or vomiting. Reported on 05/09/2016    . Propylene Glycol (SYSTANE COMPLETE) 0.6 % SOLN Place 1 drop into both eyes 4 (four) times daily.    . ranitidine (ZANTAC) 300 MG tablet Take 300 mg by mouth 2 (two) times daily.     Marland Kitchen topiramate (TOPAMAX) 25 MG tablet Take 2 tablets (50 mg total) by mouth at bedtime. 180 tablet 0   No current facility-administered medications for this visit.     Allergies:   Erythromycin; Tramadol; and  Dilaudid [hydromorphone hcl]    Social History:  The patient  reports that  has never smoked. she has never used smokeless tobacco. She reports that she does not drink alcohol or use drugs.   Family History:  The patient's family history includes Allergies in her mother and sister; Asthma in her sister; Cancer in her maternal aunt and maternal aunt; Colon cancer in her maternal aunt; Diabetes in her father; Heart disease in her father, mother, paternal grandfather, and paternal grandmother; Hypertension in her  father and mother; Irritable bowel syndrome in her sister; Kidney disease in her father; Macular degeneration in her mother; Stroke (age of onset: 76) in her father.    ROS:  General:no colds or fevers, no weight changes Skin:no rashes or ulcers HEENT:no blurred vision, no congestion CV:see HPI PUL:see HPI GI:no diarrhea constipation or melena, no indigestion GU:no hematuria, no dysuria MS:no joint pain, no claudication Neuro:no syncope, no lightheadedness Endo:no diabetes, no thyroid disease  Wt Readings from Last 3 Encounters:  01/24/18 227 lb 8 oz (103.2 kg)  01/01/18 227 lb (103 kg)  12/27/17 231 lb 8 oz (105 kg)     PHYSICAL EXAM: VS:  BP 138/80   Pulse 87   Ht 5\' 2"  (1.575 m)   Wt 227 lb 8 oz (103.2 kg)   LMP 07/27/2006   SpO2 96%   BMI 41.61 kg/m  , BMI Body mass index is 41.61 kg/m. Affect appropriate Obese white female  HEENT: normal Neck supple with no adenopathy JVP normal no bruits no thyromegaly Lungs clear with no wheezing and good diaphragmatic motion Heart:  S1/S2 no murmur, no rub, gallop or click PMI normal Abdomen: benighn, BS positve, no tenderness, no AAA no bruit.  No HSM or HJR Distal pulses intact with no bruits No edema Neuro non-focal Skin warm and dry No muscular weakness     EKG:   12/18/17   SR at 67 no changes from previous.    Recent Labs: 12/06/2017: ALT 22; TSH 1.632 12/18/2017: NT-Pro BNP 28 12/27/2017: BUN 20; Creatinine,  Ser 0.89; Hemoglobin 12.3; Platelets 377; Potassium 4.3; Sodium 140    Lipid Panel    Component Value Date/Time   CHOL 119 12/07/2017 0500   TRIG 186 (H) 12/07/2017 0500   HDL 32 (L) 12/07/2017 0500   CHOLHDL 3.7 12/07/2017 0500   VLDL 37 12/07/2017 0500   LDLCALC 50 12/07/2017 0500       Other studies Reviewed: Additional studies/ records that were reviewed today include:  ECHO 12/07/17. Study Conclusions  - Left ventricle: The cavity size was normal. Wall thickness was   increased in a pattern of mild LVH. Systolic function was normal.   The estimated ejection fraction was in the range of 60% to 65%.   Although no diagnostic regional wall motion abnormality was   identified, this possibility cannot be completely excluded on the   basis of this study. Doppler parameters are consistent with   abnormal left ventricular relaxation (grade 1 diastolic   dysfunction). - Aortic valve: There was no stenosis. - Mitral valve: There was no significant regurgitation. - Right ventricle: The cavity size was normal. Systolic function   was normal. - Pulmonary arteries: No complete TR doppler jet so unable to   estimate PA systolic pressure. - Inferior vena cava: The vessel was normal in size. The   respirophasic diameter changes were in the normal range (>= 50%),   consistent with normal central venous pressure. - Pericardium, extracardiac: A trivial pericardial effusion was   identified posterior to the heart.  Impressions:  - Normal LV size with mild LV hypertrophy. EF 60-65%. Normal RV   size and systolic function. No significant valvular   abnormalities.  NUC study 12/08/17  There was no ST segment deviation noted during stress.  No T wave inversion was noted during stress.  Defect 1: There is a small defect of severe severity present in the apical anterior, apical septal and apex location.  This is a low risk study.  The left ventricular ejection fraction is normal  (55-65%).  Nuclear stress EF: 63%.   Low risk, probably normal stress nuclear study with breast attenuation but no ischemia; EF 63 with normal wall motion.  ASSESSMENT AND PLAN:  1. Chest pain non cardiac normal heart cath 01/01/18  2. Dyspnea:  Mildly elevated PA systolic at cath normal EDP and EF from obesity and OSA Has f/u with Dr Radford Pax To assess her CPAP 3. Anxiety/Depression: on Abilify and Cymbalta f/u primary  4. Obesity:  Referred to Bariatric center she seems motivated to follow through   Does not need to f/u with cardiology on a regular basis   [employer]

## 2018-01-20 ENCOUNTER — Other Ambulatory Visit: Payer: Self-pay | Admitting: Rheumatology

## 2018-01-20 NOTE — Telephone Encounter (Signed)
Last Visit: 09/04/17 Next Visit: 03/12/18  Okay to refill per Dr. Estanislado Pandy

## 2018-01-23 ENCOUNTER — Ambulatory Visit: Payer: 59 | Admitting: Cardiovascular Disease

## 2018-01-24 ENCOUNTER — Ambulatory Visit (INDEPENDENT_AMBULATORY_CARE_PROVIDER_SITE_OTHER): Payer: 59 | Admitting: Cardiovascular Disease

## 2018-01-24 ENCOUNTER — Encounter: Payer: Self-pay | Admitting: Cardiovascular Disease

## 2018-01-24 VITALS — BP 138/80 | HR 87 | Ht 62.0 in | Wt 227.5 lb

## 2018-01-24 DIAGNOSIS — R0600 Dyspnea, unspecified: Secondary | ICD-10-CM

## 2018-01-24 NOTE — Patient Instructions (Addendum)
Medication Instructions:  Your physician recommends that you continue on your current medications as directed. Please refer to the Current Medication list given to you today.  Labwork: NONE  Testing/Procedures: NONE  Follow-Up: Your physician wants you to follow-up as needed with Dr. Johnsie Cancel.   Here is the number to [facility] and [phone].   If you need a refill on your cardiac medications before your next appointment, please call your pharmacy.

## 2018-02-06 ENCOUNTER — Other Ambulatory Visit: Payer: Self-pay | Admitting: Rheumatology

## 2018-02-07 ENCOUNTER — Other Ambulatory Visit: Payer: Self-pay | Admitting: Family Medicine

## 2018-02-07 DIAGNOSIS — E785 Hyperlipidemia, unspecified: Secondary | ICD-10-CM

## 2018-02-07 NOTE — Telephone Encounter (Signed)
ok 

## 2018-02-07 NOTE — Telephone Encounter (Signed)
Last Visit: 09/04/17 Next Visit: 03/12/18  Okay to refill Armodafinil?

## 2018-02-10 ENCOUNTER — Encounter: Payer: Self-pay | Admitting: Family Medicine

## 2018-02-10 ENCOUNTER — Ambulatory Visit: Payer: 59 | Admitting: Family Medicine

## 2018-02-10 VITALS — BP 110/70 | HR 76 | Temp 98.6°F | Resp 16 | Wt 230.6 lb

## 2018-02-10 DIAGNOSIS — J029 Acute pharyngitis, unspecified: Secondary | ICD-10-CM

## 2018-02-10 DIAGNOSIS — R0982 Postnasal drip: Secondary | ICD-10-CM | POA: Diagnosis not present

## 2018-02-10 DIAGNOSIS — J0100 Acute maxillary sinusitis, unspecified: Secondary | ICD-10-CM | POA: Diagnosis not present

## 2018-02-10 LAB — POCT RAPID STREP A (OFFICE): Rapid Strep A Screen: NEGATIVE

## 2018-02-10 MED ORDER — FLUTICASONE PROPIONATE 50 MCG/ACT NA SUSP: 2.0000 | Freq: Every day | NASAL | 1 refills | Status: DC

## 2018-02-10 MED ORDER — AMOXICILLIN 875 MG PO TABS: 875.0000 mg | ORAL_TABLET | Freq: Two times a day (BID) | ORAL | 0 refills | Status: DC

## 2018-02-10 NOTE — Patient Instructions (Signed)
Take the antibiotic as prescribed. Stay well hydrated. You can continue taking Mucinex. Add salt water gargles. You can also try Flonase if you have this at home. Saline nasal spray as well.   Call or return if you are not back to baseline after completing the antibiotic or sooner if needed.

## 2018-02-10 NOTE — Progress Notes (Signed)
Chief Complaint  Patient presents with  . sore throat    sore throat- had over a week, running nose- yellowish green and sometimes bloody, drainage     Subjective:  [name] is a 62 y.o. female who presents for a one week history of sore throat, rhinorrhea with intermittent thick yellowish mucus, nasal congestion, sinus pressure, post nasal drainage, wheezing but no cough. States she thinks she is getting worse.   Denies fever, chills, body aches, cough, chest pain, shortness of breath, abdominal pain, N/V/D.   Treatment to date: Mucinex, Saline nasal spray .  +  sick contacts.  No other aggravating or relieving factors.  No other c/o.  ROS as in subjective.   Objective: Vitals:   02/10/18 1334  BP: 110/70  Pulse: 76  Resp: 16  Temp: 98.6 F (37 C)  SpO2: 96%    General appearance: Alert, WD/WN, no distress, mildly ill appearing                             Skin: warm, no rash                           Head: +maxillary R>L sinus tenderness                            Eyes: conjunctiva normal, corneas clear, PERRLA                            Ears: pearly TMs, external ear canals normal                          Nose: septum midline, turbinates swollen, with erythema and thick discharge             Mouth/throat: MMM, tongue normal, mild pharyngeal erythema, no exudate or edema                           Neck: supple, no adenopathy, no thyromegaly, nontender                          Heart: RRR, normal S1, S2, no murmurs                         Lungs: CTA bilaterally, no wheezes, rales, or rhonchi      Assessment: Acute non-recurrent maxillary sinusitis - Plan: amoxicillin (AMOXIL) 875 MG tablet, POCT rapid strep A  Acute pharyngitis, unspecified etiology  Post-nasal drainage    Plan: Negative strep test.  Discussed diagnosis and treatment of acute sinusitis. Amoxil prescribed. She may use Flonase. I refilled this per patient request. She will try salt water gargles,  continue Mucinex.   Nasal saline spray for congestion.  Tylenol OTC for fever and malaise.  Call/return if symptoms worsen or if she is not back to baseline after completing the antibiotic.

## 2018-02-16 ENCOUNTER — Other Ambulatory Visit: Payer: Self-pay | Admitting: Rheumatology

## 2018-02-17 NOTE — Telephone Encounter (Signed)
Last Visit: 09/04/17 Next Visit: 03/12/18  Okay to refill per Dr. Estanislado Pandy

## 2018-02-19 ENCOUNTER — Other Ambulatory Visit: Payer: Self-pay | Admitting: Family Medicine

## 2018-02-19 ENCOUNTER — Other Ambulatory Visit: Payer: Self-pay | Admitting: Rheumatology

## 2018-02-19 DIAGNOSIS — E785 Hyperlipidemia, unspecified: Secondary | ICD-10-CM

## 2018-02-19 NOTE — Telephone Encounter (Signed)
Last Visit: 09/04/17 Next Visit: 03/12/18  Okay to refill per Dr. Estanislado Pandy

## 2018-02-26 NOTE — Progress Notes (Deleted)
Office Visit Note  Patient: [name]             Date of Birth: [birth year]           MRN: [id number]             PCP: Rita Ohara, MD Referring: Rita Ohara, MD Visit Date: 03/12/2018 Occupation: @GUAROCC @    Subjective:  No chief complaint on file.   History of Present Illness: [name] is a 62 y.o. female ***   Activities of Daily Living:  Patient reports morning stiffness for *** {minute/hour:19697}.   Patient {ACTIONS;DENIES/REPORTS:21021675::"Denies"} nocturnal pain.  Difficulty dressing/grooming: {ACTIONS;DENIES/REPORTS:21021675::"Denies"} Difficulty climbing stairs: {ACTIONS;DENIES/REPORTS:21021675::"Denies"} Difficulty getting out of chair: {ACTIONS;DENIES/REPORTS:21021675::"Denies"} Difficulty using hands for taps, buttons, cutlery, and/or writing: {ACTIONS;DENIES/REPORTS:21021675::"Denies"}   No Rheumatology ROS completed.   PMFS History:  Patient Active Problem List   Diagnosis Date Noted  . Dyspnea 12/26/2017  . Restrictive lung disease secondary to obesity 12/26/2017  . Atypical chest pain 12/06/2017  . Sinus tachycardia 12/06/2017  . Chest pain 12/06/2017  . DJD (degenerative joint disease), cervical 12/24/2016  . Primary osteoarthritis of both hips 12/24/2016  . Primary osteoarthritis of both knees 12/24/2016  . H/O total knee replacement, right 12/24/2016  . Spondylosis of lumbar region without myelopathy or radiculopathy 12/24/2016  . Acute gout 05/30/2016  . Myalgia 03/29/2016  . Other long term (current) drug therapy 03/29/2016  . Idiopathic peripheral neuropathy 03/29/2016  . Cannot sleep 03/29/2016  . Migraine without aura and responsive to treatment 03/29/2016  . Multifocal myoclonus 03/29/2016  . Restless leg 03/29/2016  . Has a tremor 03/29/2016  . History of aspiration pneumonitis 01/25/2016  . History of acute bronchitis 01/25/2016  . LPRD (laryngopharyngeal reflux disease) 01/25/2016  . Imbalance 01/09/2016  . Serotonin  syndrome 12/22/2015  . OA (osteoarthritis) of knee 09/05/2015  . Obesity 05/17/2015  . OSA (obstructive sleep apnea) 03/16/2013  . Iron deficiency 11/06/2012  . Thrombocythemia ([place]) 11/06/2012  . Leukocytosis 11/05/2012  . Impaired fasting glucose 07/23/2012  . Bronchitis 04/03/2012  . Kidney stone on left side 03/05/2012  . Kidney cysts 03/04/2012  . Loose stools 03/04/2012  . Urinary frequency 12/04/2011  . Restless leg syndrome 12/04/2011  . Polypharmacy 12/04/2011  . Bladder incontinence 12/04/2011  . Fibromyalgia   . Hyperlipidemia   . Edema   . S/P endometrial ablation   . Allergic rhinitis 09/18/2011  . Depression, major, single episode, in partial remission ([place]) 04/21/2011  . PRECORDIAL PAIN 02/17/2010  . Anxiety state 01/30/2010  . Migraine 01/30/2010  . GERD 01/30/2010  . Gastroparesis 01/30/2010    Past Medical History:  Diagnosis Date  . Anemia    previously followed by Dr. Jamse Arn for anemia and elevated platelets  . Anxiety   . C. difficile colitis 10/01/12   treated by [facility]  . Chronic fatigue syndrome   . DDD (degenerative disc disease), lumbar 08/19/14   and facet arthroplasty & left lumbar radiculopathy (Dr.Ramos)  . Depression   . Dyssynergia    dyssynergenic defecation, contributing to fecal incontinence.  . Edema   . Fibromyalgia   . Gastroparesis    follow[facility] at [facility]  . GERD (gastroesophageal reflux disease)   . Hyperlipidemia   . Kidney stone   . Lumbar radiculopathy   . Migraine   . Neuropathy   . Obstructive sleep apnea    Does not wear CPAP  . Paresthesia    Dr. Everette Rank at [facility]  . Pelvic floor dysfunction  pelvic floor dyssynergy  . Plantar fasciitis 02/2011   R foot  . Pneumonia    2012  . PONV (postoperative nausea and vomiting)    pt states has gastroparesis has difficulty taking antibiotics and narcotics has severe nausea and vomiting   . Restless leg syndrome   . S/P endometrial ablation 08/09/2006    Novasure Ablation  . S/P epidural steroid injection 09/20/14   Dr.Ramos  . Tremor    Dr. Everette Rank  . Urinary frequency   . Urinary incontinence     Family History  Problem Relation Age of Onset  . Allergies Mother   . Hypertension Mother   . Heart disease Mother        possible valve problem - leaking valve  . Macular degeneration Mother   . Heart disease Father        pacemaker, CHF  . Hypertension Father   . Diabetes Father        borderline  . Stroke Father 25  . Kidney disease Father   . Asthma Sister   . Irritable bowel syndrome Sister   . Allergies Sister   . Heart disease Paternal Grandmother   . Heart disease Paternal Grandfather   . Cancer Maternal Aunt        leukemia  . Cancer Maternal Aunt   . Colon cancer Maternal Aunt        late 70's  . CAD Neg Hx    Past Surgical History:  Procedure Laterality Date  . CHOLECYSTECTOMY  9/05  . ENDOMETRIAL ABLATION  08/09/2006   Dr. Valentina Shaggy Ablation  . FACET JOINT INJECTION  04/17/2017   Left L4-5 and L5-S1  . KNEE ARTHROPLASTY    . KNEE SURGERY  1999   R knee, Dr. Eddie Dibbles, torn cartilage  . RIGHT/LEFT HEART CATH AND CORONARY ANGIOGRAPHY N/A 01/01/2018   Procedure: RIGHT/LEFT HEART CATH AND CORONARY ANGIOGRAPHY;  Surgeon: Sherren Mocha, MD;  Location: [place] [facility];  Service: Cardiovascular;  Laterality: N/A;  . TONSILLECTOMY  1968  . TONSILLECTOMY    . TOTAL KNEE ARTHROPLASTY Right 09/05/2015   Procedure: RIGHT TOTAL KNEE ARTHROPLASTY;  Surgeon: Gaynelle Arabian, MD;  [facility];  Service: Orthopedics;  Laterality: Right;  . ULTRASOUND GUIDANCE FOR VASCULAR ACCESS  01/01/2018   Procedure: Ultrasound Guidance For Vascular Access;  Surgeon: Sherren Mocha, MD;  Location: [place] [facility];  Service: Cardiovascular;;   Social History   Social History Narrative   Married, 1 dog. 1 son in [place] (expecting first child end 06/2017), 1 stepson in [place], with 2 children     Objective: Vital Signs: LMP  07/27/2006    Physical Exam   Musculoskeletal Exam: ***  CDAI Exam: No CDAI exam completed.    Investigation: No additional findings. CBC Latest Ref Rng & Units 12/27/2017 12/07/2017 12/06/2017  WBC 3.4 - 10.8 x10E3/uL 10.3 10.4 12.0(H)  Hemoglobin 11.1 - 15.9 g/dL 12.3 12.8 12.3  Hematocrit 34.0 - 46.6 % 37.6 40.5 40.5  Platelets 150 - 379 x10E3/uL 377 397 396   CMP Latest Ref Rng & Units 12/27/2017 12/18/2017 12/07/2017  Glucose 65 - 99 mg/dL 147(H) 128(H) 143(H)  BUN 8 - 27 mg/dL 20 23 17   Creatinine 0.57 - 1.00 mg/dL 0.89 0.79 0.87  Sodium 134 - 144 mmol/L 140 142 141  Potassium 3.5 - 5.2 mmol/L 4.3 4.2 4.4  Chloride 96 - 106 mmol/L 101 102 107  CO2 20 - 29 mmol/L 22 25 24   Calcium 8.7 - 10.3  mg/dL 8.8 9.1 8.5(L)  Total Protein 6.5 - 8.1 g/dL - - -  Total Bilirubin 0.3 - 1.2 mg/dL - - -  Alkaline Phos 38 - 126 U/L - - -  AST 15 - 41 U/L - - -  ALT 14 - 54 U/L - - -    Imaging: No results found.  Speciality Comments: No specialty comments available.    Procedures:  No procedures performed Allergies: Erythromycin; Tramadol; and Dilaudid [hydromorphone hcl]   Assessment / Plan:     Visit Diagnoses: No diagnosis found.    Orders: No orders of the defined types were placed in this encounter.  No orders of the defined types were placed in this encounter.   Face-to-face time spent with patient was *** minutes. 50% of time was spent in counseling and coordination of care.  Follow-Up Instructions: No follow-ups on file.   Earnestine Mealing, CMA  Note - This record has been created using Editor, commissioning.  Chart creation errors have been sought, but may not always  have been located. Such creation errors do not reflect on  the standard of medical care.

## 2018-03-02 ENCOUNTER — Other Ambulatory Visit: Payer: Self-pay | Admitting: Family Medicine

## 2018-03-02 NOTE — Progress Notes (Addendum)
Cardiology Office Note:    Date:  03/03/2018   ID:  [name], DOB 11-Apr-1956, MRN [id number]  PCP:  Rita Ohara, MD  Cardiologist:  Jenkins Rouge, MD    Referring MD: Rita Ohara, MD   Chief Complaint  Patient presents with  . Sleep Apnea    History of Present Illness:    [name] is a 62 y.o. female with a hx of chest pain with normal heart cath, mild pulmonary HTN at cath with normal LVEDP, obesity and OSA on CPAP.  She is here to establish care with sleep specialist.    She is doing well with her CPAP device.  She tolerates the nasal pillow mask but feels that the pressure sometimes is too high.  She has not been using her device a lot recently because she has had some problems with knee pain and has had to sleep in a recliner and does not have her CPAP device in the room where her recliner is.   When she uses PAP she feels more rested in the am but has problems with chronic fatigue.  She gets up to use the restroom at night.  She has significant problems with dry mouth and dry eyes but attributes it to some of the medications she is on. She does not think that he snores.     Past Medical History:  Diagnosis Date  . Anemia    previously followed by Dr. Jamse Arn for anemia and elevated platelets  . Anxiety   . C. difficile colitis 10/01/12   treated by [facility]  . Chronic fatigue syndrome   . DDD (degenerative disc disease), lumbar 08/19/14   and facet arthroplasty & left lumbar radiculopathy (Dr.Ramos)  . Depression   . Dyssynergia    dyssynergenic defecation, contributing to fecal incontinence.  . Edema   . Fibromyalgia   . Gastroparesis    followed at [facility]  . GERD (gastroesophageal reflux disease)   . Hyperlipidemia   . Kidney stone   . Lumbar radiculopathy   . Migraine   . Neuropathy   . Obstructive sleep apnea    Does not wear CPAP  . Paresthesia    Dr. Everette Rank at [facility]  . Pelvic floor dysfunction    pelvic floor dyssynergy  . Plantar fasciitis  02/2011   R foot  . Pneumonia    2012  . PONV (postoperative nausea and vomiting)    pt states has gastroparesis has difficulty taking antibiotics and narcotics has severe nausea and vomiting   . Restless leg syndrome   . S/P endometrial ablation 08/09/2006   Novasure Ablation  . S/P epidural steroid injection 09/20/14   Dr.Ramos  . Tremor    Dr. Everette Rank  . Urinary frequency   . Urinary incontinence     Past Surgical History:  Procedure Laterality Date  . CHOLECYSTECTOMY  9/05  . ENDOMETRIAL ABLATION  08/09/2006   Dr. Valentina Shaggy Ablation  . FACET JOINT INJECTION  04/17/2017   Left L4-5 and L5-S1  . KNEE ARTHROPLASTY    . KNEE SURGERY  1999   R knee, Dr. Eddie Dibbles, torn cartilage  . RIGHT/LEFT HEART CATH AND CORONARY ANGIOGRAPHY N/A 01/01/2018   Procedure: RIGHT/LEFT HEART CATH AND CORONARY ANGIOGRAPHY;  Surgeon: Sherren Mocha, MD;  Location: [place] [facility];  Service: Cardiovascular;  Laterality: N/A;  . TONSILLECTOMY  1968  . TONSILLECTOMY    . TOTAL KNEE ARTHROPLASTY Right 09/05/2015   Procedure: RIGHT TOTAL KNEE ARTHROPLASTY;  Surgeon:  Gaynelle Arabian, MD;  [facility];  Service: Orthopedics;  Laterality: Right;  . ULTRASOUND GUIDANCE FOR VASCULAR ACCESS  01/01/2018   Procedure: Ultrasound Guidance For Vascular Access;  Surgeon: Sherren Mocha, MD;  Location: [place] [facility];  Service: Cardiovascular;;    Current Medications: Current Meds  Medication Sig  . albuterol (PROVENTIL HFA;VENTOLIN HFA) 108 (90 Base) MCG/ACT inhaler Inhale 1-2 puffs into the lungs every 6 (six) hours as needed for wheezing or shortness of breath. Reported on 05/30/2016  . allopurinol (ZYLOPRIM) 300 MG tablet TAKE 1 TABLET (300 MG TOTAL) DAILY BY MOUTH.  Marland Kitchen ALPRAZolam (XANAX) 0.25 MG tablet Take 0.25 mg by mouth 3 (three) times daily as needed for anxiety.   . ARIPiprazole (ABILIFY) 5 MG tablet Take 5 mg by mouth daily.  . Armodafinil 250 MG tablet TAKE 1 TABLET EVERY DAY WITH BREAKFAST  .  aspirin-acetaminophen-caffeine (EXCEDRIN MIGRAINE) 250-250-65 MG tablet Take 1-2 tablets by mouth every 6 (six) hours as needed for headache.  Marland Kitchen atorvastatin (LIPITOR) 40 MG tablet TAKE 1 TABLET BY MOUTH EVERY DAY  . cetirizine (ZYRTEC) 10 MG tablet Take 10 mg by mouth at bedtime.  . cholecalciferol (VITAMIN D) 1000 units tablet Take 1,000 Units by mouth daily.  . colchicine 0.6 MG tablet Take 2 tablets by mouth at onset of gout flare. Taken an additional tablet 1 hour later, if needed. May take like this every 3d, if needed  . DULoxetine (CYMBALTA) 60 MG capsule Take 60 mg by mouth 2 (two) times daily.   . fluticasone (FLONASE) 50 MCG/ACT nasal spray PLACE 2 SPRAYS INTO BOTH NOSTRILS DAILY AS NEEDED FOR ALLERGIES  . furosemide (LASIX) 20 MG tablet Take 1 tablet (20 mg total) by mouth daily.  . methocarbamol (ROBAXIN) 500 MG tablet TAKE 1 TABLET AT 7AM AND AT 2PM  . metoprolol tartrate (LOPRESSOR) 50 MG tablet Take 1 tablet (50 mg total) by mouth 2 (two) times daily.  . Multiple Vitamins-Minerals (ICAPS AREDS 2) CAPS Take 1 capsule by mouth 2 (two) times daily.  . nortriptyline (PAMELOR) 25 MG capsule Take 75-100 mg by mouth at bedtime.  . ondansetron (ZOFRAN) 8 MG tablet Take 8 mg by mouth every 8 (eight) hours as needed for nausea or vomiting.  . pantoprazole (PROTONIX) 40 MG tablet Take 40 mg by mouth 2 (two) times daily.  . Probiotic Product ([place]) CAPS Take 1 capsule by mouth daily. Reported on 01/02/2016  . prochlorperazine (COMPAZINE) 10 MG tablet Take 10 mg by mouth every 6 (six) hours as needed for nausea or vomiting. Reported on 05/09/2016  . promethazine (PHENERGAN) 25 MG suppository Place 25 mg rectally every 6 (six) hours as needed for nausea or vomiting. Reported on 05/09/2016  . Propylene Glycol (SYSTANE COMPLETE) 0.6 % SOLN Place 1 drop into both eyes 4 (four) times daily.  . ranitidine (ZANTAC) 300 MG tablet Take 300 mg by mouth 2 (two) times daily.   Marland Kitchen topiramate  (TOPAMAX) 25 MG tablet TAKE 2 TABLETS (50 MG TOTAL) BY MOUTH AT BEDTIME.     Allergies:   Erythromycin; Tramadol; and Dilaudid [hydromorphone hcl]   Social History   Socioeconomic History  . Marital status: Married    Spouse name: Not on file  . Number of children: 2  . Years of education: Not on file  . Highest education level: Not on file  Occupational History  . Occupation: Therapist, art (on disability)    Employer: [employer]  Social Needs  .  Financial resource strain: Not on file  . Food insecurity:    Worry: Not on file    Inability: Not on file  . Transportation needs:    Medical: Not on file    Non-medical: Not on file  Tobacco Use  . Smoking status: Never Smoker  . Smokeless tobacco: Never Used  Substance and Sexual Activity  . Alcohol use: No    Alcohol/week: 0.0 oz  . Drug use: No  . Sexual activity: Not Currently    Birth control/protection: Post-menopausal    Comment: 1st intercourse 62 yo-Fewer than 5 partners  Lifestyle  . Physical activity:    Days per week: Not on file    Minutes per session: Not on file  . Stress: Not on file  Relationships  . Social connections:    Talks on phone: Not on file    Gets together: Not on file    Attends religious service: Not on file    Active member of club or organization: Not on file    Attends meetings of clubs or organizations: Not on file    Relationship status: Not on file  Other Topics Concern  . Not on file  Social History Narrative   Married, 1 dog. 1 son in [place] (expecting first child end 06/2017), 1 stepson in [place], with 2 children     Family History: The patient's family history includes Allergies in her mother and sister; Asthma in her sister; Cancer in her maternal aunt and maternal aunt; Colon cancer in her maternal aunt; Diabetes in her father; Heart disease in her father, mother, paternal grandfather, and paternal grandmother; Hypertension in her father and mother; Irritable bowel  syndrome in her sister; Kidney disease in her father; Macular degeneration in her mother; Stroke (age of onset: 68) in her father. There is no history of CAD.  ROS:   Please see the history of present illness.    ROS  All other systems reviewed and negative.   EKGs/Labs/Other Studies Reviewed:    The following studies were reviewed today: none  EKG:  EKG is not ordered today. Recent Labs: 12/06/2017: ALT 22; TSH 1.632 12/18/2017: NT-Pro BNP 28 12/27/2017: BUN 20; Creatinine, Ser 0.89; Hemoglobin 12.3; Platelets 377; Potassium 4.3; Sodium 140   Recent Lipid Panel    Component Value Date/Time   CHOL 119 12/07/2017 0500   TRIG 186 (H) 12/07/2017 0500   HDL 32 (L) 12/07/2017 0500   CHOLHDL 3.7 12/07/2017 0500   VLDL 37 12/07/2017 0500   LDLCALC 50 12/07/2017 0500   LDLCALC 68 10/03/2017 1020    Physical Exam:    VS:  BP 124/80   Pulse 88   Ht 5\' 2"  (1.575 m)   Wt 230 lb 1.9 oz (104.4 kg)   LMP 07/27/2006   BMI 42.09 kg/m     Wt Readings from Last 3 Encounters:  03/03/18 230 lb 1.9 oz (104.4 kg)  02/10/18 230 lb 9.6 oz (104.6 kg)  01/24/18 227 lb 8 oz (103.2 kg)     GEN:  Well nourished, well developed in no acute distress HEENT: Normal NECK: No JVD; No carotid bruits LYMPHATICS: No lymphadenopathy CARDIAC: RRR, no murmurs, rubs, gallops RESPIRATORY:  Clear to auscultation without rales, wheezing or rhonchi  ABDOMEN: Soft, non-tender, non-distended MUSCULOSKELETAL:  No edema; No deformity  SKIN: Warm and dry NEUROLOGIC:  Alert and oriented x 3 PSYCHIATRIC:  Normal affect   ASSESSMENT:    1. OSA (obstructive sleep apnea)   2. Class 2  severe obesity due to excess calories with serious comorbidity in adult, unspecified BMI ([place])    PLAN:    In order of problems listed above:  1.  OSA - the patient is tolerating PAP therapy but feels that the pressure may be too high.  I am going to get a 2 week autotitration from 4 to 18cm H2O to see if we can lower her pressure.  The patient has been using and benefiting from PAP use and will continue to benefit from therapy.   2.  Obesity - I have encouraged her to get into a routine exercise program and cut back on carbs and portions.    Medication Adjustments/Labs and Tests Ordered: Current medicines are reviewed at length with the patient today.  Concerns regarding medicines are outlined above.  No orders of the defined types were placed in this encounter.  No orders of the defined types were placed in this encounter.   Signed, Fransico Him, MD  03/03/2018 9:53 AM    [place]

## 2018-03-03 ENCOUNTER — Ambulatory Visit (INDEPENDENT_AMBULATORY_CARE_PROVIDER_SITE_OTHER): Payer: 59 | Admitting: Cardiology

## 2018-03-03 ENCOUNTER — Encounter: Payer: Self-pay | Admitting: Cardiology

## 2018-03-03 VITALS — BP 124/80 | HR 88 | Ht 62.0 in | Wt 230.1 lb

## 2018-03-03 DIAGNOSIS — G4733 Obstructive sleep apnea (adult) (pediatric): Secondary | ICD-10-CM | POA: Diagnosis not present

## 2018-03-03 NOTE — Patient Instructions (Signed)
Medication Instructions:  Your physician recommends that you continue on your current medications as directed. Please refer to the Current Medication list given to you today.  If you need a refill on your cardiac medications, please contact your pharmacy first.  Labwork: None ordered   Testing/Procedures: None ordered   Follow-Up: Your physician wants you to follow-up in: 1 year with Dr. Turner. You will receive a reminder letter in the mail two months in advance. If you don't receive a letter, please call our office to schedule the follow-up appointment.  Any Other Special Instructions Will Be Listed Below (If Applicable).   Thank you for choosing [facility]    Rena Sula Fetterly, RN  [phone]  If you need a refill on your cardiac medications before your next appointment, please call your pharmacy.   

## 2018-03-05 ENCOUNTER — Encounter: Payer: Self-pay | Admitting: Family Medicine

## 2018-03-05 ENCOUNTER — Ambulatory Visit: Payer: 59 | Admitting: Family Medicine

## 2018-03-05 VITALS — BP 136/80 | HR 84 | Temp 100.0°F | Ht 62.0 in | Wt 229.4 lb

## 2018-03-05 DIAGNOSIS — J029 Acute pharyngitis, unspecified: Secondary | ICD-10-CM

## 2018-03-05 DIAGNOSIS — J069 Acute upper respiratory infection, unspecified: Secondary | ICD-10-CM | POA: Diagnosis not present

## 2018-03-05 LAB — POCT RAPID STREP A (OFFICE): Rapid Strep A Screen: NEGATIVE

## 2018-03-05 NOTE — Progress Notes (Signed)
Chief Complaint  Patient presents with  . Cough    and ST. Cough started yesterday. Just feels bad.    2 nights ago she couldn't sleep due to sore throat. Cough started yesterday, runny nose. Mucus is yellow.  Cough is nonproductive. No known fever or chills. Felt achy, hot and cold last night.   Throat feels a little better today, now only really hurts when she coughs.  She has been using Mucinex and Delsym syrup since yesterday.  She has been using Tylenol, last dose was over 4 hours ago (earlier this morning). Taking meloxicam for her knee.  No known sick contacts. 59 month old grandson hasn't been sick  She saw Vickie 3/18, diagnosed with sinus infection and treated with amoxil. She got completely better, until 2 nights ago.  She reports she went to [facility] for consult.  They will be sending info here.  She will need to wait 6 months prior to any surgery due to insurance. Her husband will be retiring in June and insurance will be changing.  PMH, PSH, SH reviewed.  Outpatient Encounter Medications as of 03/05/2018  Medication Sig  . allopurinol (ZYLOPRIM) 300 MG tablet TAKE 1 TABLET (300 MG TOTAL) DAILY BY MOUTH.  Marland Kitchen ALPRAZolam (XANAX) 0.25 MG tablet Take 0.25 mg by mouth 3 (three) times daily as needed for anxiety.   . ARIPiprazole (ABILIFY) 5 MG tablet Take 5 mg by mouth daily.  . Armodafinil 250 MG tablet TAKE 1 TABLET EVERY DAY WITH BREAKFAST  . aspirin-acetaminophen-caffeine (EXCEDRIN MIGRAINE) 250-250-65 MG tablet Take 1-2 tablets by mouth every 6 (six) hours as needed for headache.  Marland Kitchen atorvastatin (LIPITOR) 40 MG tablet TAKE 1 TABLET BY MOUTH EVERY DAY  . cetirizine (ZYRTEC) 10 MG tablet Take 10 mg by mouth at bedtime.  . cholecalciferol (VITAMIN D) 1000 units tablet Take 1,000 Units by mouth daily.  Marland Kitchen Dextromethorphan Polistirex (DELSYM PO) Take 10 mLs by mouth 2 (two) times daily.  . DULoxetine (CYMBALTA) 60 MG capsule Take 60 mg by mouth 2 (two) times daily.   . fluticasone  (FLONASE) 50 MCG/ACT nasal spray PLACE 2 SPRAYS INTO BOTH NOSTRILS DAILY AS NEEDED FOR ALLERGIES  . furosemide (LASIX) 20 MG tablet Take 1 tablet (20 mg total) by mouth daily.  Marland Kitchen guaiFENesin (MUCINEX) 600 MG 12 hr tablet Take 600 mg by mouth 2 (two) times daily.  . meloxicam (MOBIC) 15 MG tablet Take 15 mg by mouth daily.  . metoprolol tartrate (LOPRESSOR) 50 MG tablet Take 1 tablet (50 mg total) by mouth 2 (two) times daily.  . Multiple Vitamins-Minerals (ICAPS AREDS 2) CAPS Take 1 capsule by mouth 2 (two) times daily.  . nortriptyline (PAMELOR) 25 MG capsule Take 75-100 mg by mouth at bedtime.  . ondansetron (ZOFRAN) 8 MG tablet Take 8 mg by mouth every 8 (eight) hours as needed for nausea or vomiting.  . pantoprazole (PROTONIX) 40 MG tablet Take 40 mg by mouth 2 (two) times daily.  . Probiotic Product ([place]) CAPS Take 1 capsule by mouth daily. Reported on 01/02/2016  . Propylene Glycol (SYSTANE COMPLETE) 0.6 % SOLN Place 1 drop into both eyes 4 (four) times daily.  . ranitidine (ZANTAC) 300 MG tablet Take 300 mg by mouth 2 (two) times daily.   Marland Kitchen topiramate (TOPAMAX) 25 MG tablet TAKE 2 TABLETS (50 MG TOTAL) BY MOUTH AT BEDTIME.  Marland Kitchen albuterol (PROVENTIL HFA;VENTOLIN HFA) 108 (90 Base) MCG/ACT inhaler Inhale 1-2 puffs into the lungs every 6 (six) hours  as needed for wheezing or shortness of breath. Reported on 05/30/2016 (Patient not taking: Reported on 03/05/2018)  . colchicine 0.6 MG tablet Take 2 tablets by mouth at onset of gout flare. Taken an additional tablet 1 hour later, if needed. May take like this every 3d, if needed (Patient not taking: Reported on 03/05/2018)  . methocarbamol (ROBAXIN) 500 MG tablet TAKE 1 TABLET AT 7AM AND AT 2PM (Patient not taking: Reported on 03/05/2018)  . prochlorperazine (COMPAZINE) 10 MG tablet Take 10 mg by mouth every 6 (six) hours as needed for nausea or vomiting. Reported on 05/09/2016  . promethazine (PHENERGAN) 25 MG suppository Place 25 mg  rectally every 6 (six) hours as needed for nausea or vomiting. Reported on 05/09/2016  . [DISCONTINUED] carbamazepine (TEGRETOL XR) 100 MG 12 hr tablet Take 200 mg by mouth daily.    . [DISCONTINUED] Eszopiclone 3 MG TABS Take 3 mg by mouth at bedtime. Take immediately before bedtime   No facility-administered encounter medications on file as of 03/05/2018.    Allergies  Allergen Reactions  . Erythromycin Nausea Only  . Tramadol Itching  . Dilaudid [Hydromorphone Hcl] Itching    ROS:  Some diarrhea 2 nights ago and yesterday morning, none today.  Some gagging yesterday with cough, no vomiting.  No bleeding, bruising, rash, chest pain, shortness of breath (just intermittently, per her usual).  Some bleeding from her nose when she blows it. No known fever, slight hot/cold.  +cough, sore throat per HPI  PHYSICAL EXAM:  BP 136/80   Pulse 84   Temp 100 F (37.8 C) (Tympanic)   Ht 5\' 2"  (1.575 m)   Wt 229 lb 6.4 oz (104.1 kg)   LMP 07/27/2006   BMI 41.96 kg/m   Hoarse sounding female, in no distress.  Occasional cough and sniffling during visit.  In no distress HEENT: PERRL, EOMI, conjunctiva and sclera are clear. TM's and EAC's are normal. Nasal mucosa is mildly edematous on the right, mod on the left with some erythema, crusting and whitish-yellow drainage.  Sinuses are nontender. OP is clear Neck: no lymphadenopathy or mass Heart: regular rate and rhythm, no murmur Lungs: clear bilaterally, no wheezes, rales, ronchi. Skin: normal turgor, no rash Neuro: alert and oriented, cranial nerves intact, normal gait.   Rapid strep negative   ASSESSMENT/PLAN:  Viral upper respiratory infection  Sore throat - Plan: Rapid Strep A   Obesity--await forms from [facility]. Discussed that she would benefit from undergoing the nutrition, psych eval/counseling that is required by insurance.   Drink plenty of fluids. Continue tylenol as needed for pain and fever. Continue sinus rinses--do once or  twice daily. Continue mucinex (guaifenesin). I think taking a medication to help dry up the runny nose will also help. Consider taking Coricidin HBP. If that contains dextromethorphan as a cough suppressant, then you will need to stop taking the Delsym to not get duplicate ingredients. I suspect this is a virus, given the lowgrade fevers and achiness, and that it may take another 2-5 days before turning the corner and start feeling better. If you fevers go up rather than improving, if your mucus stays discolored and you develop increasing sinus pain/pressure or cough, please contact us for an antibiotic.  Right now it seems viral, not a bacterial infection, but if symptoms last longer than 10 days, we will treat with antibiotic.

## 2018-03-05 NOTE — Patient Instructions (Addendum)
  Drink plenty of fluids. Continue tylenol as needed for pain and fever. Continue sinus rinses--do once or twice daily. Continue mucinex (guaifenesin). I think taking a medication to help dry up the runny nose will also help. Consider taking Coricidin HBP. If that contains dextromethorphan as a cough suppressant, then you will need to stop taking the Delsym to not get duplicate ingredients. I suspect this is a virus, given the lowgrade fevers and achiness, and that it may take another 2-5 days before turning the corner and start feeling better. If you fevers go up rather than improving, if your mucus stays discolored and you develop increasing sinus pain/pressure or cough, please contact us for an antibiotic.  Right now it seems viral, not a bacterial infection, but if symptoms last longer than 10 days, we will treat with antibiotic.    Upper Respiratory Infection, Adult Most upper respiratory infections (URIs) are caused by a virus. A URI affects the nose, throat, and upper air passages. The most common type of URI is often called "the common cold." Follow these instructions at home:  Take medicines only as told by your doctor.  Gargle warm saltwater or take cough drops to comfort your throat as told by your doctor.  Use a warm mist humidifier or inhale steam from a shower to increase air moisture. This may make it easier to breathe.  Drink enough fluid to keep your pee (urine) clear or pale yellow.  Eat soups and other clear broths.  Have a healthy diet.  Rest as needed.  Go back to work when your fever is gone or your doctor says it is okay. ? You may need to stay home longer to avoid giving your URI to others. ? You can also wear a face mask and wash your hands often to prevent spread of the virus.  Use your inhaler more if you have asthma.  Do not use any tobacco products, including cigarettes, chewing tobacco, or electronic cigarettes. If you need help quitting, ask your  doctor. Contact a doctor if:  You are getting worse, not better.  Your symptoms are not helped by medicine.  You have chills.  You are getting more short of breath.  You have brown or red mucus.  You have yellow or brown discharge from your nose.  You have pain in your face, especially when you bend forward.  You have a fever.  You have puffy (swollen) neck glands.  You have pain while swallowing.  You have white areas in the back of your throat. Get help right away if:  You have very bad or constant: ? Headache. ? Ear pain. ? Pain in your forehead, behind your eyes, and over your cheekbones (sinus pain). ? Chest pain.  You have long-lasting (chronic) lung disease and any of the following: ? Wheezing. ? Long-lasting cough. ? Coughing up blood. ? A change in your usual mucus.  You have a stiff neck.  You have changes in your: ? Vision. ? Hearing. ? Thinking. ? Mood. This information is not intended to replace advice given to you by your health care provider. Make sure you discuss any questions you have with your health care provider. Document Released: 04/30/2008 Document Revised: 07/15/2016 Document Reviewed: 02/17/2014 Elsevier Interactive Patient Education  2018 [employer].

## 2018-03-11 ENCOUNTER — Encounter: Payer: Self-pay | Admitting: Medical

## 2018-03-11 ENCOUNTER — Ambulatory Visit: Payer: 59 | Admitting: Medical

## 2018-03-11 VITALS — BP 124/84 | HR 83 | Temp 98.3°F | Ht 63.25 in | Wt 227.8 lb

## 2018-03-11 DIAGNOSIS — J3489 Other specified disorders of nose and nasal sinuses: Secondary | ICD-10-CM

## 2018-03-11 DIAGNOSIS — H938X3 Other specified disorders of ear, bilateral: Secondary | ICD-10-CM

## 2018-03-11 DIAGNOSIS — R059 Cough, unspecified: Secondary | ICD-10-CM

## 2018-03-11 DIAGNOSIS — J029 Acute pharyngitis, unspecified: Secondary | ICD-10-CM

## 2018-03-11 DIAGNOSIS — R05 Cough: Secondary | ICD-10-CM

## 2018-03-11 MED ORDER — AMOXICILLIN-POT CLAVULANATE 875-125 MG PO TABS: 1.0000 | ORAL_TABLET | Freq: Two times a day (BID) | ORAL | 0 refills | Status: DC

## 2018-03-11 NOTE — Progress Notes (Signed)
Subjective: Chief Complaint  Patient presents with  . Acute Visit    saw Dr.Knapp last week, throat is still hurting   Here for f/u.  Saw Dr. Tomi Bamberger on 03/05/18 for URI symptoms.  On 03/05/18 had 1-2 day hx/o cough, sore throat, runny nose, yellow mucous, cough nonproductive.   Had some aching and hot and cold feeling.   Was diagnosed with URI symptoms, discussed supportive care.    Currently she notes not resolving.  Last night bad sore throat all night, felt worse again yesterday although she was feeling the day before. No fever.   No NVD.  No wheezing, no SOB.  No new sick contacts.    Main symptoms are lots of nose blowing, lots of congestion, some blood tinged mucous from nose, sore throat and cough.    No other aggravating or relieving factors. No other complaint.  Past Medical History:  Diagnosis Date  . Anemia    previously followed by Dr. Jamse Arn for anemia and elevated platelets  . Anxiety   . C. difficile colitis 10/01/12   treated by [facility]  . Chronic fatigue syndrome   . DDD (degenerative disc disease), lumbar 08/19/14   and facet arthroplasty & left lumbar radiculopathy (Dr.Ramos)  . Depression   . Dyssynergia    dyssynergenic defecation, contributing to fecal incontinence.  . Edema   . Fibromyalgia   . Gastroparesis    followed at [facility]  . GERD (gastroesophageal reflux disease)   . Hyperlipidemia   . Kidney stone   . Lumbar radiculopathy   . Migraine   . Neuropathy   . Obstructive sleep apnea    Does not wear CPAP  . Paresthesia    [facility]. Everette Rank at [facility]  . Pelvic floor dysfunction    pelvic floor dyssynergy  . Plantar fasciitis 02/2011   R foot  . Pneumonia    2012  . PONV (postoperative nausea and vomiting)    pt states has gastroparesis has difficulty taking antibiotics and narcotics has severe nausea and vomiting   . Restless leg syndrome   . S/P endometrial ablation 08/09/2006   Novasure Ablation  . S/P epidural steroid injection 09/20/14    Dr.Ramos  . Tremor    Dr. Everette Rank  . Urinary frequency   . Urinary incontinence    Current Outpatient Medications on File Prior to Visit  Medication Sig Dispense Refill  . albuterol (PROVENTIL HFA;VENTOLIN HFA) 108 (90 Base) MCG/ACT inhaler Inhale 1-2 puffs into the lungs every 6 (six) hours as needed for wheezing or shortness of breath. Reported on 05/30/2016 1 Inhaler 3  . allopurinol (ZYLOPRIM) 300 MG tablet TAKE 1 TABLET (300 MG TOTAL) DAILY BY MOUTH. 90 tablet 0  . ALPRAZolam (XANAX) 0.25 MG tablet Take 0.25 mg by mouth 3 (three) times daily as needed for anxiety.     . ARIPiprazole (ABILIFY) 5 MG tablet Take 5 mg by mouth daily.  5  . Armodafinil 250 MG tablet TAKE 1 TABLET EVERY DAY WITH BREAKFAST 90 tablet 0  . aspirin-acetaminophen-caffeine (EXCEDRIN MIGRAINE) 818-299-37 MG tablet Take 1-2 tablets by mouth every 6 (six) hours as needed for headache.    Marland Kitchen atorvastatin (LIPITOR) 40 MG tablet TAKE 1 TABLET BY MOUTH EVERY DAY 90 tablet 0  . cholecalciferol (VITAMIN D) 1000 units tablet Take 1,000 Units by mouth daily.    Marland Kitchen Dextromethorphan Polistirex (DELSYM PO) Take 10 mLs by mouth 2 (two) times daily.    . DULoxetine (CYMBALTA) 60 MG capsule Take  60 mg by mouth 2 (two) times daily.     . fluticasone (FLONASE) 50 MCG/ACT nasal spray PLACE 2 SPRAYS INTO BOTH NOSTRILS DAILY AS NEEDED FOR ALLERGIES 16 g 0  . furosemide (LASIX) 20 MG tablet Take 1 tablet (20 mg total) by mouth daily. 30 tablet 11  . guaiFENesin (MUCINEX) 600 MG 12 hr tablet Take 600 mg by mouth 2 (two) times daily.    . meloxicam (MOBIC) 15 MG tablet Take 15 mg by mouth daily.    . methocarbamol (ROBAXIN) 500 MG tablet TAKE 1 TABLET AT 7AM AND AT 2PM 60 tablet 0  . metoprolol tartrate (LOPRESSOR) 50 MG tablet Take 1 tablet (50 mg total) by mouth 2 (two) times daily. 180 tablet 3  . Multiple Vitamins-Minerals (ICAPS AREDS 2) CAPS Take 1 capsule by mouth 2 (two) times daily.    . nortriptyline (PAMELOR) 25 MG capsule Take  75-100 mg by mouth at bedtime.    . ondansetron (ZOFRAN) 8 MG tablet Take 8 mg by mouth every 8 (eight) hours as needed for nausea or vomiting.    . pantoprazole (PROTONIX) 40 MG tablet Take 40 mg by mouth 2 (two) times daily.    . Probiotic Product ([place]) CAPS Take 1 capsule by mouth daily. Reported on 01/02/2016    . prochlorperazine (COMPAZINE) 10 MG tablet Take 10 mg by mouth every 6 (six) hours as needed for nausea or vomiting. Reported on 05/09/2016    . promethazine (PHENERGAN) 25 MG suppository Place 25 mg rectally every 6 (six) hours as needed for nausea or vomiting. Reported on 05/09/2016    . Propylene Glycol (SYSTANE COMPLETE) 0.6 % SOLN Place 1 drop into both eyes 4 (four) times daily.    . ranitidine (ZANTAC) 300 MG tablet Take 300 mg by mouth 2 (two) times daily.     Marland Kitchen topiramate (TOPAMAX) 25 MG tablet TAKE 2 TABLETS (50 MG TOTAL) BY MOUTH AT BEDTIME. 180 tablet 0  . cetirizine (ZYRTEC) 10 MG tablet Take 10 mg by mouth at bedtime.    . colchicine 0.6 MG tablet Take 2 tablets by mouth at onset of gout flare. Taken an additional tablet 1 hour later, if needed. May take like this every 3d, if needed (Patient not taking: Reported on 03/05/2018) 30 tablet 0  . [DISCONTINUED] carbamazepine (TEGRETOL XR) 100 MG 12 hr tablet Take 200 mg by mouth daily.      . [DISCONTINUED] Eszopiclone 3 MG TABS Take 3 mg by mouth at bedtime. Take immediately before bedtime     No current facility-administered medications on file prior to visit.    ROS as in subjective    Objective: BP 124/84 (BP Location: Right Arm, Patient Position: Sitting, Cuff Size: Normal)   Pulse 83   Temp 98.3 F (36.8 C) (Oral)   Ht 5' 3.25" (1.607 m)   Wt 227 lb 12.8 oz (103.3 kg)   LMP 07/27/2006   SpO2 94%   BMI 40.03 kg/m   Wt Readings from Last 3 Encounters:  03/11/18 227 lb 12.8 oz (103.3 kg)  03/05/18 229 lb 6.4 oz (104.1 kg)  03/03/18 230 lb 1.9 oz (104.4 kg)   General appearance: alert, no  distress, WD/WN HEENT: normocephalic, sclerae anicteric, mild right frontal sinus tenderness, TMs with serous effusions, nares patent, but with mucoid discharge and mild erythema, pharynx with mild erythema Oral cavity: MMM, no lesions Neck: supple, no lymphadenopathy, no thyromegaly, no masses Lungs: decreased breath sounds but  no wheezes,  rhonchi, or rales    Assessment: Encounter Diagnoses  Name Primary?  . Pressure sensation in both ears Yes  . Sore throat   . Sinus pressure   . Cough       Plan: Medication below, c/t her routine allergy medication and c/t another few more days of mucinex.  Rest, hydrate well.   call if not improving within a week.   Carlina was seen today for acute visit.  Diagnoses and all orders for this visit:  Pressure sensation in both ears  Sore throat  Sinus pressure  Cough  Other orders -     amoxicillin-clavulanate (AUGMENTIN) 875-125 MG tablet; Take 1 tablet by mouth 2 (two) times daily.

## 2018-03-12 ENCOUNTER — Ambulatory Visit: Payer: 59 | Admitting: Rheumatology

## 2018-03-19 ENCOUNTER — Other Ambulatory Visit: Payer: Self-pay | Admitting: Family Medicine

## 2018-03-27 ENCOUNTER — Other Ambulatory Visit: Payer: Self-pay | Admitting: Family Medicine

## 2018-04-02 ENCOUNTER — Ambulatory Visit: Payer: 59 | Admitting: Family Medicine

## 2018-04-02 ENCOUNTER — Encounter: Payer: Self-pay | Admitting: Family Medicine

## 2018-04-02 VITALS — BP 132/80 | HR 80 | Temp 99.0°F | Ht 63.25 in | Wt 225.8 lb

## 2018-04-02 DIAGNOSIS — R05 Cough: Secondary | ICD-10-CM | POA: Diagnosis not present

## 2018-04-02 DIAGNOSIS — J302 Other seasonal allergic rhinitis: Secondary | ICD-10-CM | POA: Diagnosis not present

## 2018-04-02 DIAGNOSIS — J4521 Mild intermittent asthma with (acute) exacerbation: Secondary | ICD-10-CM

## 2018-04-02 DIAGNOSIS — R059 Cough, unspecified: Secondary | ICD-10-CM

## 2018-04-02 DIAGNOSIS — J069 Acute upper respiratory infection, unspecified: Secondary | ICD-10-CM

## 2018-04-02 MED ORDER — BENZONATATE 200 MG PO CAPS
200.0000 mg | ORAL_CAPSULE | Freq: Three times a day (TID) | ORAL | 0 refills | Status: DC | PRN
Start: 2018-04-02 — End: 2018-06-16

## 2018-04-02 NOTE — Progress Notes (Signed)
Chief Complaint  Patient presents with  . Cough    brings up mucus but it never really comes out-when she finally gets the mucus out it is tan to brown in color.  It is very thick. Mostly in her chest but has had a bit of a HA.    She presents with complaint of worsening cough over the last 4 days. She wasn't able to go to church on Sunday due to her coughing.  She coughs up thick phlegm, too thick to actually expectorate.  Looked "tannish" the one time she could see it. This morning she noticed pink tinge, but she ate watermelon last night. Didn't look like blood.   She is having runny nose, sniffling.  Mucus has been clear, noticed just slight greenish tint in the last couple of days.  Denies sinus pain. She has had some headache at her right forehead (frontal, not temple)--her usual sinus headaches are in her cheek, and that isn't hurting.   She has been wheezing some for the past 2 days. She used inhaler, which helped. Needed it 3 times on Monday (2 days ago), once or twice yesterday, none yet today.    Hoarse voice since Christmas, sick on and off, can't ever get completely well. Seen by Loletha Carrow 3/18 (rx amoxil), me 4/10 (virus, no ABX), followed by Audelia Acton 4/16, treated with Augmentin, finished the end of April. She doesn't feel like she ever completely resolved, but was doing fairly okay until a few days ago when the cough got worse.  She has been using Mucinex 12 hour twice daily, Delsym syrup for cough. She switched from Allegra to zyrtec, which helped just a little.  PMH, PSH, SH reviewed  Outpatient Encounter Medications as of 04/02/2018  Medication Sig  . albuterol (PROVENTIL HFA;VENTOLIN HFA) 108 (90 Base) MCG/ACT inhaler Inhale 1-2 puffs into the lungs every 6 (six) hours as needed for wheezing or shortness of breath. Reported on 05/30/2016  . allopurinol (ZYLOPRIM) 300 MG tablet TAKE 1 TABLET (300 MG TOTAL) DAILY BY MOUTH.  Marland Kitchen ALPRAZolam (XANAX) 0.25 MG tablet Take 0.25 mg by mouth 3  (three) times daily as needed for anxiety.   . ARIPiprazole (ABILIFY) 5 MG tablet Take 5 mg by mouth daily.  . Armodafinil 250 MG tablet TAKE 1 TABLET EVERY DAY WITH BREAKFAST  . atorvastatin (LIPITOR) 40 MG tablet TAKE 1 TABLET BY MOUTH EVERY DAY  . cholecalciferol (VITAMIN D) 1000 units tablet Take 1,000 Units by mouth daily.  Marland Kitchen Dextromethorphan Polistirex (DELSYM PO) Take 10 mLs by mouth 2 (two) times daily.  . DULoxetine (CYMBALTA) 60 MG capsule Take 60 mg by mouth 2 (two) times daily.   . fexofenadine (ALLEGRA) 180 MG tablet Take 180 mg by mouth daily.  . fluticasone (FLONASE) 50 MCG/ACT nasal spray PLACE 2 SPRAYS INTO BOTH NOSTRILS DAILY AS NEEDED FOR ALLERGIES  . furosemide (LASIX) 20 MG tablet Take 1 tablet (20 mg total) by mouth daily.  Marland Kitchen guaiFENesin (MUCINEX) 600 MG 12 hr tablet Take 600 mg by mouth 2 (two) times daily.  . meloxicam (MOBIC) 15 MG tablet Take 15 mg by mouth daily.  . methocarbamol (ROBAXIN) 500 MG tablet TAKE 1 TABLET AT 7AM AND AT 2PM  . Multiple Vitamins-Minerals (ICAPS AREDS 2) CAPS Take 1 capsule by mouth 2 (two) times daily.  . nortriptyline (PAMELOR) 25 MG capsule Take 75-100 mg by mouth at bedtime.  . pantoprazole (PROTONIX) 40 MG tablet Take 40 mg by mouth 2 (two) times daily.  Marland Kitchen  Probiotic Product (Bulverde) CAPS Take 1 capsule by mouth daily. Reported on 01/02/2016  . prochlorperazine (COMPAZINE) 10 MG tablet Take 10 mg by mouth every 6 (six) hours as needed for nausea or vomiting. Reported on 05/09/2016  . Propylene Glycol (SYSTANE COMPLETE) 0.6 % SOLN Place 1 drop into both eyes 4 (four) times daily.  . ranitidine (ZANTAC) 300 MG tablet Take 300 mg by mouth 2 (two) times daily.   Marland Kitchen topiramate (TOPAMAX) 25 MG tablet TAKE 2 TABLETS (50 MG TOTAL) BY MOUTH AT BEDTIME.  . [DISCONTINUED] cetirizine (ZYRTEC) 10 MG tablet Take 10 mg by mouth at bedtime.  Marland Kitchen aspirin-acetaminophen-caffeine (EXCEDRIN MIGRAINE) 250-250-65 MG tablet Take 1-2 tablets by mouth  every 6 (six) hours as needed for headache.  . benzonatate (TESSALON) 200 MG capsule Take 1 capsule (200 mg total) by mouth 3 (three) times daily as needed.  . colchicine 0.6 MG tablet Take 2 tablets by mouth at onset of gout flare. Taken an additional tablet 1 hour later, if needed. May take like this every 3d, if needed (Patient not taking: Reported on 03/05/2018)  . metoprolol tartrate (LOPRESSOR) 50 MG tablet Take 1 tablet (50 mg total) by mouth 2 (two) times daily.  . ondansetron (ZOFRAN) 8 MG tablet Take 8 mg by mouth every 8 (eight) hours as needed for nausea or vomiting.  . promethazine (PHENERGAN) 25 MG suppository Place 25 mg rectally every 6 (six) hours as needed for nausea or vomiting. Reported on 05/09/2016  . [DISCONTINUED] amoxicillin-clavulanate (AUGMENTIN) 875-125 MG tablet Take 1 tablet by mouth 2 (two) times daily.  . [DISCONTINUED] carbamazepine (TEGRETOL XR) 100 MG 12 hr tablet Take 200 mg by mouth daily.    . [DISCONTINUED] Eszopiclone 3 MG TABS Take 3 mg by mouth at bedtime. Take immediately before bedtime   No facility-administered encounter medications on file as of 04/02/2018.    Allergies  Allergen Reactions  . Erythromycin Nausea Only  . Tramadol Itching  . Dilaudid [Hydromorphone Hcl] Itching   ROS: URI symptoms per HPI.  No ear pain. No known fever, chills, vomiting, diarrhea, rashes, bleeding, bruising.  PHYSICAL EXAM:  BP 132/80   Pulse 80   Temp 99 F (37.2 C) (Tympanic)   Ht 5' 3.25" (1.607 m)   Wt 225 lb 12.8 oz (102.4 kg)   LMP 07/27/2006   BMI 39.68 kg/m   Well-appearing female, with some sniffling and dry cough during visit. Speaking easily in full sentences, in no distress HEENT: conjunctiva and sclera are clear, TM's and EAC's normal. Nasal mucosa with mild edema.  Yellow mucus in left nares, none in the right Mildly tender over right maxillary sinus. Neck: no lymphadenopathy or mass Heart: regular rate and rhythm Lungs: clear--initially heard  one small wheeze, but with additional breaths, clear and no further wheezing noted. No ronchi or rales Skin: normal turgor, no rash Psych: normal mood, affect, hygiene and grooming Neuro: alert and oriented, cranial nerves intact, normal gait  ASSESSMENT/PLAN:  Viral upper respiratory tract infection - likely has allergies, with exacerbation by viral URI (acute worsening with LG fever, yellow mucus, cough) without e/o sinus infection  Cough - Plan: benzonatate (TESSALON) 200 MG capsule  Seasonal allergies  Mild intermittent reactive airway disease with acute exacerbation - flare of mild wheezing with URI, responding well to albuterol. cont prn, feeling better/less wheezing today, without using inhaler    Drink plenty of water. Continue the mucinex twice daily. Use Tessalon perles three times daily if needed for  cough. Continue to use your inhaler as needed for wheezing--it didn't sound as though there was any significant wheezing, pneumonia or bronchitis on your exam today. Continue flonase.  Exam is consistent with either another cold/virus (causing yellow mucus and low grade fever) vs an early sinus infection.  However, your sinus discomfort was on the other side from where I saw the discolored drainage. So, I suspect more of a virus. Restart doing Neti-pot/sinus rinses to help with sinus pressure and try and prevent a sinus infection. If symptoms are worse by Monday, then we will need to start another antibiotic (specifically color of mucus/phlegm and any persistent fevers). If you continue to have recurrent sinus infections, we likely should check a CT scan and/or send you to an ENT for further evaluation.

## 2018-04-02 NOTE — Patient Instructions (Signed)
  Drink plenty of water. Continue the mucinex twice daily. Use Tessalon perles three times daily if needed for cough. Continue to use your inhaler as needed for wheezing--it didn't sound as though there was any significant wheezing, pneumonia or bronchitis on your exam today. Continue flonase.  Exam is consistent with either another cold/virus (causing yellow mucus and low grade fever) vs an early sinus infection.  However, your sinus discomfort was on the other side from where I saw the discolored drainage. So, I suspect more of a virus. Restart doing Neti-pot/sinus rinses to help with sinus pressure and try and prevent a sinus infection. If symptoms are worse by Monday, then we will need to start another antibiotic (specifically color of mucus/phlegm and any persistent fevers). If you continue to have recurrent sinus infections, we likely should check a CT scan and/or send you to an ENT for further evaluation.

## 2018-04-07 ENCOUNTER — Telehealth: Payer: Self-pay

## 2018-04-07 MED ORDER — DOXYCYCLINE HYCLATE 100 MG PO TABS: 100.0000 mg | ORAL_TABLET | Freq: Two times a day (BID) | ORAL | 0 refills | Status: DC

## 2018-04-07 NOTE — Telephone Encounter (Signed)
Patient advised.

## 2018-04-07 NOTE — Telephone Encounter (Signed)
Advise pt that I sent in doxycycline to her [facility] (didn't want to use Augmentin again, as she just had that last month). Have her f/u if symptoms persist/worsen despite this treatment. Continue sinus rinses, and other measures that we discussed at her last visit.

## 2018-04-07 NOTE — Telephone Encounter (Signed)
Patient called in and she is no better.  Her cough is worse and she also has nasal congestion.  She states that Dr Tomi Bamberger wanted her to call back to give update on her condition and possibly call her something else in.   Please advice and call her back at [phone].  Thank you

## 2018-04-08 NOTE — Progress Notes (Signed)
Chief Complaint  Patient presents with  . Annual Exam    fasting annual exam no pap sees GYN. Just had eye exam and just had skin check. No concerns.     [name] is a 62 y.o. female who presents for a complete physical.  She has the following concerns:  F/u URI:  She was seen 5/8 with URI symptoms. She called on 5/13 stating that her cough and congestion were worse, and she was started on doxycycline. She seems better than she was Sunday.  Still coughing (unable fully get the phlegm up/out, slightly yellow).  Nasal drainage is sometimes clear, sometimes yellow.  No significant sinus headaches. She has been using albuterol about once a day during this illness, helped. Today used it this morning, but had some SOB with exertion, walking into the building.  Allergies:Using Flonase and zyrtec every day.   Pre-diabetes and obesity:  Last A1c was 6.2 in December, fasting glucose 109 in November. She declined Metformin at her December visit.  Trying to work on exercise and diet.  She has scheduled visit with [facility] regarding bariatric surgical options (non-surgical weight loss options were looked into, but were too expensive).  She plans to discuss these options with Dr. Derrill Kay (GI) at her next visit.  Gastroparesis: stable. F/u scheduled with Dr. Derrill Kay in June. She has more "wretching" than actual vomiting.  Last gastric emptying test was normal. Reflux is a component of her nausea.  Only rare reflux symptoms.  OSA:  She saw Dr. Radford Pax for consult last month. There was concern about pressure being too high, so she was being set up or 2 week autotitration (from 4 to 18cm H2O) to see if pressure can be lowered. She should be getting a new machine from DME provider next week, and thinks she will have that autotitration study done then.  Saw Dr. Johnsie Cancel in March for hospital f/u. She had normal heart cath 01/01/18, and no further f/u with him is needed.  Hyperlipidemia follow-up: Patient  is trying to follow a low-fat, low cholesterol diet as best she can. Compliant with medications and denies medication side effects.  She has been off fish oil since 11/2016. Lab Results  Component Value Date   CHOL 119 12/07/2017   HDL 32 (L) 12/07/2017   LDLCALC 50 12/07/2017   TRIG 186 (H) 12/07/2017   CHOLHDL 3.7 12/07/2017    Migraines:Takes topamax for prevention (also on pamelor). Gets migraines less than once a month, relieved by OTC medication most of the time. Other headaches are a little more frequent (not migraines). Tremor--on beta blocker--she reports that for the most part it is well controlled.   Neuropathy in her feet--Under care of Dr. Everette Rank, whom she last saw in October (for migraines, tremor, neuropathy). She declined change in regimen of Cymbalta and nortriptyline, though I believe taper was discussed.  She is on 3 of the nortriptyline.  Feet are still burning. Previously took gabapentin (300mg  in am, 600mg  qHS), stopped due to her not noticing improvement.  She was to f/u in 2-3 mos, nothing scheduled. She called yesterday and got scheduled for August.  RLS: This is controlled, not very often.  Fibromyalgia: treated by Dr. Estanislado Pandy. She was last seen in October 2018; patient canceled her April appointment. She prescribes armodafinil (Nuvigil) for chronic fatigue.  Seeing ortho (Dr. Anne Fu PA, [facility]) for knee pain, getting 3 "gel shots".  She is to follow up with him if not improved.  She  does home exercises for her knee.  Depression:  Moods have been better, hasn't been getting counseling recently. Previously under the care of Dr. Caprice Beaver, now sees another physician [facility] in the same practice.   GYN exam with Dr. Phineas Real was 07/10/17, andDEXA was done at their office later that month, normal.  Hypertension: (never formally diagnosed, because hadbeen on diuretics for fluid retention (stopped a while ago) and beta blockers for tremor--but high BP's  noted when not taking these medications in past). She periodically checks her BP at home, running 120-130/80.   Gout: Allopurinol dose was increased from 200mg  to 300mg  in May 2018, after uric acid level remained >6. Last check was at goal. Denies flares. Lab Results  Component Value Date   LABURIC 4.6 10/03/2017    Immunization History  Administered Date(s) Administered  . Influenza Split 09/17/2011, 09/03/2012, 09/04/2013  . Influenza,inj,Quad PF,6+ Mos 08/16/2014, 07/27/2015  . Influenza-Unspecified 10/04/2016, 07/31/2017  . Tdap 01/16/2011, 06/20/2017   Last Pap smear: 03/2015 Dr. Cyndie Chime; due again 2019 Last mammogram: 04/2017 Last colonoscopy: 11/2012 at [facility]; 5 yr f/u recommended (declined at her last GI visit in March, deferring discussion to her June f/u visit) Last DEXA: 06/2017 normal; repeat recommended in 5 years Dentist: twice yearly Ophtho: yearly Exercise: limited walking due to knee pain.  Doing a machine (moves hands/feet) for 10 minutes a day. Vitamin D-OH 32 in 11/2015 Thyroid: Lab Results  Component Value Date   TSH 1.632 12/06/2017    GYN: Dr. Phineas Real Neuro: Everette Rank  Sleep med/OSA: Dr. Fransico Him ((has previously seen Dr. Brett Fairy for OSA in past) Rheum: Dr. Estanislado Pandy Psych: (previously Dr. Caprice Beaver), Rollene Fare?) GI: Dr. Derrill Kay, Dr. Roney Mans Podiatry: Dr. Lora Havens: at [facility] Dentist: Dr. Ladona Horns Urologist: Dr. Jeffie Pollock Pulmonologist: Dr. Lenna Gilford Cardiologist: Dr. Johnsie Cancel  Orthopedist: [place] Derm: Dr. Ubaldo Glassing  Past Medical History:  Diagnosis Date  . Anemia    previously followed by Dr. Jamse Arn for anemia and elevated platelets  . Anxiety   . C. difficile colitis 10/01/12   treated by [facility]  . Chronic fatigue syndrome   . DDD (degenerative disc disease), lumbar 08/19/14   and facet arthroplasty & left lumbar radiculopathy (Dr.Ramos)  . Depression   . Dyssynergia    dyssynergenic defecation, contributing to fecal incontinence.  . Edema    . Fibromyalgia   . Gastroparesis    followed at [facility]  . GERD (gastroesophageal reflux disease)   . Hyperlipidemia   . Kidney stone   . Lumbar radiculopathy   . Migraine   . Neuropathy   . Obstructive sleep apnea    Does not wear CPAP  . Paresthesia    Dr. Everette Rank at [facility]  . Pelvic floor dysfunction    pelvic floor dyssynergy  . Plantar fasciitis 02/2011   R foot  . Pneumonia    2012  . PONV (postoperative nausea and vomiting)    pt states has gastroparesis has difficulty taking antibiotics and narcotics has severe nausea and vomiting   . Restless leg syndrome   . S/P endometrial ablation 08/09/2006   Novasure Ablation  . S/P epidural steroid injection 09/20/14   Dr.Ramos  . Tremor    Dr. Everette Rank  . Urinary frequency   . Urinary incontinence     Past Surgical History:  Procedure Laterality Date  . CHOLECYSTECTOMY  9/05  . ENDOMETRIAL ABLATION  08/09/2006   Dr. Valentina Shaggy Ablation  . FACET JOINT INJECTION  04/17/2017   Left L4-5 and L5-S1  .  KNEE ARTHROPLASTY    . KNEE SURGERY  1999   R knee, Dr. Eddie Dibbles, torn cartilage  . RIGHT/LEFT HEART CATH AND CORONARY ANGIOGRAPHY N/A 01/01/2018   Procedure: RIGHT/LEFT HEART CATH AND CORONARY ANGIOGRAPHY;  Surgeon: Sherren Mocha, MD;  Location: [facility];  Service: Cardiovascular;  Laterality: N/A;  . TONSILLECTOMY  1968  . TONSILLECTOMY    . TOTAL KNEE ARTHROPLASTY Right 09/05/2015   Procedure: RIGHT TOTAL KNEE ARTHROPLASTY;  Surgeon: Gaynelle Arabian, MD;  Location: [facility];  Service: Orthopedics;  Laterality: Right;  . ULTRASOUND GUIDANCE FOR VASCULAR ACCESS  01/01/2018   Procedure: Ultrasound Guidance For Vascular Access;  Surgeon: Sherren Mocha, MD;  Location: [facility];  Service: Cardiovascular;;    Social History   Socioeconomic History  . Marital status: Married    Spouse name: Not on file  . Number of children: 2  . Years of education: Not on file  . Highest education level: Not  on file  Occupational History  . Occupation: Therapist, art (on disability)    Employer: [employer]  Social Needs  . Financial resource strain: Not on file  . Food insecurity:    Worry: Not on file    Inability: Not on file  . Transportation needs:    Medical: Not on file    Non-medical: Not on file  Tobacco Use  . Smoking status: Never Smoker  . Smokeless tobacco: Never Used  Substance and Sexual Activity  . Alcohol use: No    Alcohol/week: 0.0 oz  . Drug use: No  . Sexual activity: Not Currently    Birth control/protection: Post-menopausal    Comment: 1st intercourse 62 yo-Fewer than 5 partners  Lifestyle  . Physical activity:    Days per week: Not on file    Minutes per session: Not on file  . Stress: Not on file  Relationships  . Social connections:    Talks on phone: Not on file    Gets together: Not on file    Attends religious service: Not on file    Active member of club or organization: Not on file    Attends meetings of clubs or organizations: Not on file    Relationship status: Not on file  . Intimate partner violence:    Fear of current or ex partner: Not on file    Emotionally abused: Not on file    Physically abused: Not on file    Forced sexual activity: Not on file  Other Topics Concern  . Not on file  Social History Narrative   Married, 1 dog. 1 son in [place] (grandson born 06/2017), 1 stepson in [place], with 2 children    Family History  Problem Relation Age of Onset  . Allergies Mother   . Hypertension Mother   . Heart disease Mother        possible valve problem - leaking valve  . Macular degeneration Mother   . Heart disease Father        pacemaker, CHF  . Hypertension Father   . Diabetes Father        borderline  . Stroke Father 79  . Kidney disease Father   . Asthma Sister   . Irritable bowel syndrome Sister   . Allergies Sister   . Heart disease Paternal Grandmother   . Heart disease Paternal Grandfather   . Cancer Maternal  Aunt        leukemia  . Cancer Maternal Aunt   .  Colon cancer Maternal Aunt        late 88's  . CAD Neg Hx     Outpatient Encounter Medications as of 04/09/2018  Medication Sig Note  . albuterol (PROVENTIL HFA;VENTOLIN HFA) 108 (90 Base) MCG/ACT inhaler Inhale 1-2 puffs into the lungs every 6 (six) hours as needed for wheezing or shortness of breath. Reported on 05/30/2016   . allopurinol (ZYLOPRIM) 300 MG tablet TAKE 1 TABLET (300 MG TOTAL) DAILY BY MOUTH.   Marland Kitchen ALPRAZolam (XANAX) 0.25 MG tablet Take 0.25 mg by mouth 3 (three) times daily as needed for anxiety.    . ARIPiprazole (ABILIFY) 5 MG tablet Take 5 mg by mouth daily.   . Armodafinil 250 MG tablet TAKE 1 TABLET EVERY DAY WITH BREAKFAST   . atorvastatin (LIPITOR) 40 MG tablet TAKE 1 TABLET BY MOUTH EVERY DAY   . benzonatate (TESSALON) 200 MG capsule Take 1 capsule (200 mg total) by mouth 3 (three) times daily as needed.   . cholecalciferol (VITAMIN D) 1000 units tablet Take 1,000 Units by mouth daily.   Marland Kitchen doxycycline (VIBRA-TABS) 100 MG tablet Take 1 tablet (100 mg total) by mouth 2 (two) times daily.   . DULoxetine (CYMBALTA) 60 MG capsule Take 60 mg by mouth 2 (two) times daily.    . fexofenadine (ALLEGRA) 180 MG tablet Take 180 mg by mouth daily.   . fluticasone (FLONASE) 50 MCG/ACT nasal spray PLACE 2 SPRAYS INTO BOTH NOSTRILS DAILY AS NEEDED FOR ALLERGIES   . guaiFENesin (MUCINEX) 600 MG 12 hr tablet Take 600 mg by mouth 2 (two) times daily.   . meloxicam (MOBIC) 15 MG tablet Take 15 mg by mouth daily.   . methocarbamol (ROBAXIN) 500 MG tablet TAKE 1 TABLET AT 7AM AND AT 2PM   . metoprolol tartrate (LOPRESSOR) 50 MG tablet Take 1 tablet (50 mg total) by mouth 2 (two) times daily.   . Multiple Vitamins-Minerals (ICAPS AREDS 2) CAPS Take 1 capsule by mouth 2 (two) times daily.   . nortriptyline (PAMELOR) 25 MG capsule Take 75-100 mg by mouth at bedtime. 04/09/2018: Takes 3qHS  . pantoprazole (PROTONIX) 40 MG tablet Take 40 mg by  mouth 2 (two) times daily.   . Probiotic Product ([place]) CAPS Take 1 capsule by mouth daily. Reported on 01/02/2016   . Propylene Glycol (SYSTANE COMPLETE) 0.6 % SOLN Place 1 drop into both eyes 4 (four) times daily.   . ranitidine (ZANTAC) 300 MG tablet Take 300 mg by mouth 2 (two) times daily.    Marland Kitchen topiramate (TOPAMAX) 25 MG tablet TAKE 2 TABLETS (50 MG TOTAL) BY MOUTH AT BEDTIME.   Marland Kitchen aspirin-acetaminophen-caffeine (EXCEDRIN MIGRAINE) 250-250-65 MG tablet Take 1-2 tablets by mouth every 6 (six) hours as needed for headache.   . colchicine 0.6 MG tablet Take 2 tablets by mouth at onset of gout flare. Taken an additional tablet 1 hour later, if needed. May take like this every 3d, if needed (Patient not taking: Reported on 03/05/2018)   . ondansetron (ZOFRAN) 8 MG tablet Take 8 mg by mouth every 8 (eight) hours as needed for nausea or vomiting.   . prochlorperazine (COMPAZINE) 10 MG tablet Take 10 mg by mouth every 6 (six) hours as needed for nausea or vomiting. Reported on 05/09/2016   . promethazine (PHENERGAN) 25 MG suppository Place 25 mg rectally every 6 (six) hours as needed for nausea or vomiting. Reported on 05/09/2016   . [DISCONTINUED] carbamazepine (TEGRETOL XR) 100 MG 12 hr tablet  Take 200 mg by mouth daily.     . [DISCONTINUED] Dextromethorphan Polistirex (DELSYM PO) Take 10 mLs by mouth 2 (two) times daily.   . [DISCONTINUED] Eszopiclone 3 MG TABS Take 3 mg by mouth at bedtime. Take immediately before bedtime   . [DISCONTINUED] furosemide (LASIX) 20 MG tablet Take 1 tablet (20 mg total) by mouth daily.    No facility-administered encounter medications on file as of 04/09/2018.     Allergies  Allergen Reactions  . Erythromycin Nausea Only  . Tramadol Itching  . Dilaudid [Hydromorphone Hcl] Itching    ROS: The patient denies fever, headaches (migraine as per HPI), vision changes, decreased hearing, breast concerns, palpitations, dizziness, syncope, cough, swelling,  diarrhea, constipation (often goes back and forth, per her usual), no abdominal pain, melena, hematochezia, indigestion/heartburn, hematuria, incontinence, dysuria, vaginal bleeding, discharge, odor or itch, genital lesions, weakness, suspicious skin lesions, abnormal bleeding/bruising, or enlarged lymph nodes. dyspnea on exertion--stable/improved, except related to current respiratory illness Slightly lightheaded if she stands too quickly from bending over. Tremor is stable. Depression per HPI. Chronic urinary leakage "forever" URI symptoms per HPI, cough.   PHYSICAL EXAM:  BP 118/70   Pulse 84   Ht 5\' 3"  (1.6 m)   Wt 221 lb 9.6 oz (100.5 kg)   LMP 07/27/2006   BMI 39.25 kg/m   Wt Readings from Last 3 Encounters:  04/09/18 221 lb 9.6 oz (100.5 kg)  04/02/18 225 lb 12.8 oz (102.4 kg)  03/11/18 227 lb 12.8 oz (103.3 kg)    215# at CPE in 12/2016  General Appearance:  Alert, cooperative, no distress, appears stated age  Head:  Normocephalic, without obvious abnormality, atraumatic  Eyes:  PERRL, conjunctiva/corneas clear, EOM's intact, fundi  benign  Ears:  Normal TM's and external ear canals  Nose: Nares normal. Nasal mucosa mildly edematous, white mucus and slight yellow crust distally on the right. Sinuses nontender.  Throat: Lips, mucosa, and tongue normal; teeth and gums normal  Neck: Supple, no lymphadenopathy; thyroid: no enlargement/ tenderness/nodules; no carotid bruit or JVD  Back:  Spine nontender, no curvature, ROM normal, no CVA tenderness. Nontender over muscles, no trigger points noted today  Lungs:  Clear to auscultation bilaterally without wheezes, rales or ronchi; respirations unlabored  Chest Wall:  No tenderness or deformity  Heart:  Regular rate and rhythm, S1 and S2 normal, no murmur, rub or gallop  Breast Exam:  Deferred to GYN  Abdomen:  Soft, obese, nondistended, normoactive bowel sounds, nontender, no masses, no  hepatosplenomegaly.    Genitalia:  Deferred to GYN     Extremities: No clubbing, cyanosis or edema.   Pulses: 2+ and symmetric all extremities  Skin: Skin color, texture, turgor normal, no rashes or lesions  Lymph nodes: Cervical, supraclavicular, and axillary nodes normal  Neurologic: CNII-XII intact, normal strength, sensation and gait; reflexes 2+ and symmetric throughout. No significant tremor noted  Psych: Normal mood, affect, hygiene and grooming  Lab Results  Component Value Date   HGBA1C 5.9 04/09/2018    ASSESSMENT/PLAN:  Annual physical exam - Plan: POCT Urinalysis DIP (Proadvantage Device), Comprehensive metabolic panel, Lipid panel  Pre-diabetes - stable/improved. Cont weight loss and plans for bariatric consult - Plan: HgB A1c, Comprehensive metabolic panel  Mixed hyperlipidemia - Plan: Lipid panel  Fibromyalgia  OSA (obstructive sleep apnea) - to get titration study per Dr Theodosia Blender rec  Gastroparesis - stable  Gastroesophageal reflux disease, esophagitis presence not specified  Acute gout involving toe of right foot, unspecified  cause - well controlled on allopurinol, continue  Idiopathic peripheral neuropathy - suboptimally controlled; per neuro  Migraine without status migrainosus, not intractable, unspecified migraine type - doing well on current regimen  Depression, major, single episode, in partial remission (HCC)  Medication monitoring encounter  Seasonal allergies  Upper respiratory tract infection, unspecified type - improving on doxycyline, continue   Discussed monthly self breast exams and yearly mammograms (due next month); at least 30 minutes of aerobic activity at least 5 days/week, weight-bearing exercise at least 2x/wk; proper sunscreen use reviewed; healthy diet, including goals of calcium and vitamin D intake and alcohol recommendations (less than or equal to 1 drink/day) reviewed; regular  seatbelt use; changing batteries in smoke detectors. Immunization recommendations discussed, UTD. Shingrix when available (check insurance and schedule [place]). Colonoscopy recommendations reviewed, due now, to discuss with Dr. Derrill Kay at next visit.  55 min visit, with significant time spent counseling, coordinating care and reviewing meds from many doctors in pt with h/o polypharmacy with h/o adverse effects.    Call Dr. Estanislado Pandy to schedule follow-up (reschedule from canceled appointment) [place] and [facility] neurology groups are both good, if you need a closer location.

## 2018-04-09 ENCOUNTER — Ambulatory Visit: Payer: 59 | Admitting: Family Medicine

## 2018-04-09 ENCOUNTER — Encounter: Payer: Self-pay | Admitting: Family Medicine

## 2018-04-09 VITALS — BP 118/70 | HR 84 | Ht 63.0 in | Wt 221.6 lb

## 2018-04-09 DIAGNOSIS — R7303 Prediabetes: Secondary | ICD-10-CM | POA: Diagnosis not present

## 2018-04-09 DIAGNOSIS — Z0000 Encounter for general adult medical examination without abnormal findings: Secondary | ICD-10-CM | POA: Diagnosis not present

## 2018-04-09 DIAGNOSIS — M797 Fibromyalgia: Secondary | ICD-10-CM | POA: Diagnosis not present

## 2018-04-09 DIAGNOSIS — M109 Gout, unspecified: Secondary | ICD-10-CM

## 2018-04-09 DIAGNOSIS — K3184 Gastroparesis: Secondary | ICD-10-CM

## 2018-04-09 DIAGNOSIS — G609 Hereditary and idiopathic neuropathy, unspecified: Secondary | ICD-10-CM | POA: Diagnosis not present

## 2018-04-09 DIAGNOSIS — Z5181 Encounter for therapeutic drug level monitoring: Secondary | ICD-10-CM

## 2018-04-09 DIAGNOSIS — G4733 Obstructive sleep apnea (adult) (pediatric): Secondary | ICD-10-CM | POA: Diagnosis not present

## 2018-04-09 DIAGNOSIS — E782 Mixed hyperlipidemia: Secondary | ICD-10-CM | POA: Diagnosis not present

## 2018-04-09 DIAGNOSIS — J302 Other seasonal allergic rhinitis: Secondary | ICD-10-CM | POA: Diagnosis not present

## 2018-04-09 DIAGNOSIS — K219 Gastro-esophageal reflux disease without esophagitis: Secondary | ICD-10-CM | POA: Diagnosis not present

## 2018-04-09 DIAGNOSIS — G43909 Migraine, unspecified, not intractable, without status migrainosus: Secondary | ICD-10-CM | POA: Diagnosis not present

## 2018-04-09 DIAGNOSIS — J069 Acute upper respiratory infection, unspecified: Secondary | ICD-10-CM

## 2018-04-09 DIAGNOSIS — F324 Major depressive disorder, single episode, in partial remission: Secondary | ICD-10-CM | POA: Diagnosis not present

## 2018-04-09 LAB — COMPREHENSIVE METABOLIC PANEL
ALT: 115 IU/L — ABNORMAL HIGH (ref 0–32)
AST: 156 IU/L — ABNORMAL HIGH (ref 0–40)
Albumin/Globulin Ratio: 1.5 (ref 1.2–2.2)
Albumin: 4.1 g/dL (ref 3.6–4.8)
Alkaline Phosphatase: 180 IU/L — ABNORMAL HIGH (ref 39–117)
BUN/Creatinine Ratio: 25 (ref 12–28)
BUN: 21 mg/dL (ref 8–27)
Bilirubin Total: 0.7 mg/dL (ref 0.0–1.2)
CO2: 24 mmol/L (ref 20–29)
Calcium: 9.5 mg/dL (ref 8.7–10.3)
Chloride: 102 mmol/L (ref 96–106)
Creatinine, Ser: 0.84 mg/dL (ref 0.57–1.00)
GFR calc Af Amer: 87 mL/min/{1.73_m2} (ref >59)
GFR calc non Af Amer: 75 mL/min/{1.73_m2} (ref >59)
Globulin, Total: 2.8 g/dL (ref 1.5–4.5)
Glucose: 92 mg/dL (ref 65–99)
Potassium: 4.6 mmol/L (ref 3.5–5.2)
Sodium: 141 mmol/L (ref 134–144)
Total Protein: 6.9 g/dL (ref 6.0–8.5)

## 2018-04-09 LAB — POCT URINALYSIS DIP (PROADVANTAGE DEVICE)
Bilirubin, UA: NEGATIVE
Blood, UA: NEGATIVE
Glucose, UA: NEGATIVE mg/dL
Ketones, POC UA: NEGATIVE mg/dL
Nitrite, UA: NEGATIVE
Specific Gravity, Urine: 1.03
Urobilinogen, Ur: NEGATIVE
pH, UA: 5.5 (ref 5.0–8.0)

## 2018-04-09 LAB — LIPID PANEL
Chol/HDL Ratio: 3.1 ratio (ref 0.0–4.4)
Cholesterol, Total: 144 mg/dL (ref 100–199)
HDL: 46 mg/dL (ref >39)
LDL Calculated: 64 mg/dL (ref 0–99)
Triglycerides: 170 mg/dL — ABNORMAL HIGH (ref 0–149)
VLDL Cholesterol Cal: 34 mg/dL (ref 5–40)

## 2018-04-09 LAB — POCT GLYCOSYLATED HEMOGLOBIN (HGB A1C): Hemoglobin A1C: 5.9

## 2018-04-09 NOTE — Patient Instructions (Addendum)
  HEALTH MAINTENANCE RECOMMENDATIONS:  It is recommended that you get at least 30 minutes of aerobic exercise at least 5 days/week (for weight loss, you may need as much as 60-90 minutes). This can be any activity that gets your heart rate up. This can be divided in 10-15 minute intervals if needed, but try and build up your endurance at least once a week.  Weight bearing exercise is also recommended twice weekly.  Eat a healthy diet with lots of vegetables, fruits and fiber.  "Colorful" foods have a lot of vitamins (ie green vegetables, tomatoes, red peppers, etc).  Limit sweet tea, regular sodas and alcoholic beverages, all of which has a lot of calories and sugar.  Up to 1 alcoholic drink daily may be beneficial for women (unless trying to lose weight, watch sugars).  Drink a lot of water.  Calcium recommendations are 1200-1500 mg daily (1500 mg for postmenopausal women or women without ovaries), and vitamin D 1000 IU daily.  This should be obtained from diet and/or supplements (vitamins), and calcium should not be taken all at once, but in divided doses.  Monthly self breast exams and yearly mammograms for women over the age of 21 is recommended.  Sunscreen of at least SPF 30 should be used on all sun-exposed parts of the skin when outside between the hours of 10 am and 4 pm (not just when at beach or pool, but even with exercise, golf, tennis, and yard work!)  Use a sunscreen that says "broad spectrum" so it covers both UVA and UVB rays, and make sure to reapply every 1-2 hours.  Remember to change the batteries in your smoke detectors when changing your clock times in the spring and fall.  Use your seat belt every time you are in a car, and please drive safely and not be distracted with cell phones and texting while driving.  I recommend getting the new shingles vaccine (Shingrix). You will need to check with your insurance to see if it is covered, and if covered by Medicare Part D, you need to  get from the pharmacy rather than our office.  It is a series of 2 injections, spaced 2 months apart.  Schedule your mammogram for June.  Call Dr. Estanislado Pandy to schedule follow-up (reschedule from canceled appointment) [place] and [facility] neurology groups are both good, if you need a closer location.  Complete your antibiotic. Continue Mucinex. You may use Delsym for cough if the tessalon doesn't seem to be helping. Your lungs were clear today.

## 2018-04-10 ENCOUNTER — Other Ambulatory Visit: Payer: Self-pay | Admitting: *Deleted

## 2018-04-10 DIAGNOSIS — R748 Abnormal levels of other serum enzymes: Secondary | ICD-10-CM

## 2018-04-10 MED ORDER — AMOXICILLIN-POT CLAVULANATE 875-125 MG PO TABS: 1.0000 | ORAL_TABLET | Freq: Two times a day (BID) | ORAL | 0 refills | Status: DC

## 2018-04-16 ENCOUNTER — Other Ambulatory Visit: Payer: 59

## 2018-04-16 DIAGNOSIS — R748 Abnormal levels of other serum enzymes: Secondary | ICD-10-CM

## 2018-04-17 LAB — HEPATIC FUNCTION PANEL
ALT: 23 IU/L (ref 0–32)
AST: 18 IU/L (ref 0–40)
Albumin: 3.8 g/dL (ref 3.6–4.8)
Alkaline Phosphatase: 154 IU/L — ABNORMAL HIGH (ref 39–117)
Bilirubin Total: 0.2 mg/dL (ref 0.0–1.2)
Bilirubin, Direct: 0.1 mg/dL (ref 0.00–0.40)
Total Protein: 6.7 g/dL (ref 6.0–8.5)

## 2018-04-22 ENCOUNTER — Other Ambulatory Visit (HOSPITAL_COMMUNITY): Payer: Self-pay | Admitting: Surgery

## 2018-04-22 ENCOUNTER — Other Ambulatory Visit: Payer: Self-pay | Admitting: Family Medicine

## 2018-04-22 NOTE — Telephone Encounter (Signed)
Is this ok to refill?  

## 2018-04-22 NOTE — Telephone Encounter (Signed)
Ok to refill 

## 2018-04-23 ENCOUNTER — Other Ambulatory Visit: Payer: Self-pay | Admitting: Family Medicine

## 2018-04-23 DIAGNOSIS — Z1231 Encounter for screening mammogram for malignant neoplasm of breast: Secondary | ICD-10-CM

## 2018-05-02 ENCOUNTER — Ambulatory Visit (HOSPITAL_COMMUNITY)
Admission: RE | Admit: 2018-05-02 | Discharge: 2018-05-02 | Disposition: A | Discharge status: Home / Self Care | Payer: 59 | Source: Ambulatory Visit | Attending: Surgery | Admitting: Surgery

## 2018-05-02 DIAGNOSIS — Z01818 Encounter for other preprocedural examination: Secondary | ICD-10-CM | POA: Diagnosis present

## 2018-05-05 NOTE — Progress Notes (Signed)
Office Visit Note  Patient: [name]             Date of Birth: Oct 30, 1956           MRN: [id number]             PCP: Rita Ohara, MD Referring: Rita Ohara, MD Visit Date: 05/08/2018 Occupation: @GUAROCC @    Subjective:  Neck and shoulder pain.   History of Present Illness: [name] is a 62 y.o. female with history of fibromyalgia osteoarthritis and DDD.  She states she continues to have pain and discomfort in her left knee joint.  She had cortisone injections by Dr. Maureen Ralphs followed by Lurena Nida supplement injections.  She had persistent pain she had MRI of her knee joint which showed a stress fracture.  She states Dr.Alusio discussed total knee replacement with her which will be in near future.  Continues to have some discomfort in her neck and trapezius area.  Back pain is tolerable.  She reports that she had DEXA done by her GYN last year which was normal.  Activities of Daily Living:  Patient reports morning stiffness for 15-20 minutes.   Patient Reports nocturnal pain.  Difficulty dressing/grooming: Denies Difficulty climbing stairs: Reports Difficulty getting out of chair: Reports Difficulty using hands for taps, buttons, cutlery, and/or writing: Denies   Review of Systems  Constitutional: Positive for fatigue. Negative for fever, night sweats, weight gain and weight loss.  HENT: Negative for ear pain, mouth sores, trouble swallowing, trouble swallowing, mouth dryness and nose dryness.   Eyes: Negative for pain, redness, visual disturbance and dryness.  Respiratory: Negative for cough, shortness of breath and difficulty breathing.   Cardiovascular: Negative for chest pain, palpitations, hypertension, irregular heartbeat and swelling in legs/feet.  Gastrointestinal: Negative for blood in stool, constipation and diarrhea.  Endocrine: Negative for increased urination.  Genitourinary: Negative for difficulty urinating and vaginal dryness.  Musculoskeletal: Positive for  arthralgias, joint pain, myalgias, muscle weakness, morning stiffness and myalgias. Negative for joint swelling and muscle tenderness.  Skin: Negative for color change, rash, hair loss, skin tightness, ulcers and sensitivity to sunlight.  Allergic/Immunologic: Negative for susceptible to infections.  Neurological: Positive for weakness. Negative for dizziness, numbness, memory loss and night sweats.  Hematological: Positive for bruising/bleeding tendency. Negative for swollen glands.  Psychiatric/Behavioral: Positive for depressed mood and sleep disturbance. The patient is not nervous/anxious.     PMFS History:  Patient Active Problem List   Diagnosis Date Noted  . Dyspnea 12/26/2017  . Restrictive lung disease secondary to obesity 12/26/2017  . Atypical chest pain 12/06/2017  . Sinus tachycardia 12/06/2017  . Chest pain 12/06/2017  . DJD (degenerative joint disease), cervical 12/24/2016  . Primary osteoarthritis of both hips 12/24/2016  . Primary osteoarthritis of both knees 12/24/2016  . H/O total knee replacement, right 12/24/2016  . Spondylosis of lumbar region without myelopathy or radiculopathy 12/24/2016  . Acute gout 05/30/2016  . Myalgia 03/29/2016  . Other long term (current) drug therapy 03/29/2016  . Idiopathic peripheral neuropathy 03/29/2016  . Cannot sleep 03/29/2016  . Migraine without aura and responsive to treatment 03/29/2016  . Multifocal myoclonus 03/29/2016  . Restless leg 03/29/2016  . Has a tremor 03/29/2016  . History of aspiration pneumonitis 01/25/2016  . History of acute bronchitis 01/25/2016  . LPRD (laryngopharyngeal reflux disease) 01/25/2016  . Imbalance 01/09/2016  . Serotonin syndrome 12/22/2015  . OA (osteoarthritis) of knee 09/05/2015  . Obesity 05/17/2015  . OSA (obstructive sleep  apnea) 03/16/2013  . Iron deficiency 11/06/2012  . Thrombocythemia ([place]) 11/06/2012  . Leukocytosis 11/05/2012  . Impaired fasting glucose 07/23/2012  .  Bronchitis 04/03/2012  . Kidney stone on left side 03/05/2012  . Kidney cysts 03/04/2012  . Loose stools 03/04/2012  . Urinary frequency 12/04/2011  . Restless leg syndrome 12/04/2011  . Polypharmacy 12/04/2011  . Bladder incontinence 12/04/2011  . Fibromyalgia   . Hyperlipidemia   . Edema   . S/P endometrial ablation   . Allergic rhinitis 09/18/2011  . Depression, major, single episode, in partial remission ([place]) 04/21/2011  . PRECORDIAL PAIN 02/17/2010  . Anxiety state 01/30/2010  . Migraine 01/30/2010  . GERD 01/30/2010  . Gastroparesis 01/30/2010    Past Medical History:  Diagnosis Date  . Anemia    previously followed by Dr. Jamse Arn for anemia and elevated platelets  . Anxiety   . C. difficile colitis 10/01/12   treated by [facility]  . Chronic fatigue syndrome   . DDD (degenerative disc disease), lumbar 08/19/14   and facet arthroplasty & left lumbar radiculopathy (Dr.Ramos)  . Depression   . Dyssynergia    dyssynergenic defecation, contributing to fecal incontinence.  . Edema   . Fibromyalgia   . Gastroparesis    followed at [facility]  . GERD (gastroesophageal reflux disease)   . Hyperlipidemia   . Kidney stone   . Lumbar radiculopathy   . Migraine   . Neuropathy   . Obstructive sleep apnea    Does not wear CPAP  . Paresthesia    Dr. Everette Rank at [facility]  . Pelvic floor dysfunction    pelvic floor dyssynergy  . Plantar fasciitis 02/2011   R foot  . Pneumonia    2012  . PONV (postoperative nausea and vomiting)    pt states has gastroparesis has difficulty taking antibiotics and narcotics has severe nausea and vomiting   . Restless leg syndrome   . S/P endometrial ablation 08/09/2006   Novasure Ablation  . S/P epidural steroid injection 09/20/14   Dr.Ramos  . Tremor    Dr. Everette Rank  . Urinary frequency   . Urinary incontinence     Family History  Problem Relation Age of Onset  . Allergies Mother   . Hypertension Mother   . Heart disease Mother         possible valve problem - leaking valve  . Macular degeneration Mother   . Heart disease Father        pacemaker, CHF  . Hypertension Father   . Diabetes Father        borderline  . Stroke Father 52  . Kidney disease Father   . Asthma Sister   . Irritable bowel syndrome Sister   . Allergies Sister   . Heart disease Paternal Grandmother   . Heart disease Paternal Grandfather   . Cancer Maternal Aunt        leukemia  . Cancer Maternal Aunt   . Colon cancer Maternal Aunt        late 71's  . CAD Neg Hx    Past Surgical History:  Procedure Laterality Date  . CHOLECYSTECTOMY  9/05  . ENDOMETRIAL ABLATION  08/09/2006   Dr. Valentina Shaggy Ablation  . FACET JOINT INJECTION  04/17/2017   Left L4-5 and L5-S1  . KNEE ARTHROPLASTY    . KNEE SURGERY  1999   R knee, Dr. Eddie Dibbles, torn cartilage  . RIGHT/LEFT HEART CATH AND CORONARY ANGIOGRAPHY N/A 01/01/2018   Procedure: RIGHT/LEFT HEART  CATH AND CORONARY ANGIOGRAPHY;  Surgeon: Sherren Mocha, MD;  Location: [facility];  Service: Cardiovascular;  Laterality: N/A;  . TONSILLECTOMY  1968  . TONSILLECTOMY    . TOTAL KNEE ARTHROPLASTY Right 09/05/2015   Procedure: RIGHT TOTAL KNEE ARTHROPLASTY;  Surgeon: Gaynelle Arabian, MD;  Location: [facility];  Service: Orthopedics;  Laterality: Right;  . ULTRASOUND GUIDANCE FOR VASCULAR ACCESS  01/01/2018   Procedure: Ultrasound Guidance For Vascular Access;  Surgeon: Sherren Mocha, MD;  Location: [facility];  Service: Cardiovascular;;   Social History   Social History Narrative   Married, 1 dog. 1 son in [place] (grandson born 06/2017), 1 stepson in [place], with 2 children     Objective: Vital Signs: BP 115/69 (BP Location: Left Arm, Patient Position: Sitting, Cuff Size: Normal)   Pulse 89   Ht 5\' 3"  (1.6 m)   Wt 226 lb (102.5 kg)   LMP 07/27/2006   BMI 40.03 kg/m    Physical Exam  Constitutional: She is oriented to person, place, and time. She appears well-developed and  well-nourished.  HENT:  Head: Normocephalic and atraumatic.  Eyes: Conjunctivae and EOM are normal.  Neck: Normal range of motion.  Cardiovascular: Normal rate, regular rhythm, normal heart sounds and intact distal pulses.  Pulmonary/Chest: Effort normal and breath sounds normal.  Abdominal: Soft. Bowel sounds are normal.  Lymphadenopathy:    She has no cervical adenopathy.  Neurological: She is alert and oriented to person, place, and time.  Skin: Skin is warm and dry. Capillary refill takes less than 2 seconds.  Psychiatric: She has a normal mood and affect. Her behavior is normal.  Nursing note and vitals reviewed.    Musculoskeletal Exam: On thoracic lumbar spine limited range of motion.  Shoulder joints elbow joints wrist joints MCPs PIPs DIPs were in good range of motion.  Her right total knee replacement is doing well.  She is arthritis in her left knee joint with recent fracture is causing discomfort.  CDAI Exam: No CDAI exam completed.    Investigation: No additional findings. CBC Latest Ref Rng & Units 12/27/2017 12/07/2017 12/06/2017  WBC 3.4 - 10.8 x10E3/uL 10.3 10.4 12.0(H)  Hemoglobin 11.1 - 15.9 g/dL 12.3 12.8 12.3  Hematocrit 34.0 - 46.6 % 37.6 40.5 40.5  Platelets 150 - 379 x10E3/uL 377 397 396   CMP Latest Ref Rng & Units 04/16/2018 04/09/2018 12/27/2017  Glucose 65 - 99 mg/dL - 92 147(H)  BUN 8 - 27 mg/dL - 21 20  Creatinine 0.57 - 1.00 mg/dL - 0.84 0.89  Sodium 134 - 144 mmol/L - 141 140  Potassium 3.5 - 5.2 mmol/L - 4.6 4.3  Chloride 96 - 106 mmol/L - 102 101  CO2 20 - 29 mmol/L - 24 22  Calcium 8.7 - 10.3 mg/dL - 9.5 8.8  Total Protein 6.0 - 8.5 g/dL 6.7 6.9 -  Total Bilirubin 0.0 - 1.2 mg/dL 0.2 0.7 -  Alkaline Phos 39 - 117 IU/L 154(H) 180(H) -  AST 0 - 40 IU/L 18 156(H) -  ALT 0 - 32 IU/L 23 115(H) -    Imaging: Dg Chest 2 View  Result Date: 05/02/2018 CLINICAL DATA:  Morbid obesity.  Preoperative evaluation. EXAM: CHEST - 2 VIEW COMPARISON:   12/06/2017 and chest CTA dated 12/08/2017. FINDINGS: Normal sized heart. Clear lungs with normal vascularity. Stable bilateral linear scarring. Mild thoracic spine degenerative changes. Cholecystectomy clips. IMPRESSION: No acute abnormality. Electronically Signed   By: Percell Locus.D.  On: 05/02/2018 13:41   Dg Ugi  W/kub  Result Date: 05/02/2018 CLINICAL DATA:  Morbid obesity. Pre-op evaluation for bariatric surgery. EXAM: UPPER GI SERIES WITHOUT KUB TECHNIQUE: Routine upper GI series was performed with thin barium. FLUOROSCOPY TIME:  Fluoroscopy Time:  1 minutes 54 seconds Radiation Exposure Index (if provided by the fluoroscopic device): 23.3 mGy Number of Acquired Spot Images: 0 COMPARISON:  None. FINDINGS: Esophagus: No evidence of esophageal mass or stricture. Motility is within normal limits. No gastroesophageal reflux observed. Stomach: No hiatal hernia visualized. No evidence of gastric mass or ulcer. Duodenum: No ulcer or other significant abnormality seen involving duodenal bulb or sweep. Other:  None. IMPRESSION: Negative upper GI series. Electronically Signed   By: Earle Gell M.D.   On: 05/02/2018 10:31    Speciality Comments: No specialty comments available.    Procedures:  No procedures performed Allergies: Erythromycin; Tramadol; and Dilaudid [hydromorphone hcl]   Assessment / Plan:     Visit Diagnoses: Fibromyalgia-she continues to have generalized pain and discomfort.  She had bilateral trapezius spasm.  DDD (degenerative disc disease), cervical-she has C-spine discomfort.  DDD (degenerative disc disease), lumbar-lower back pain is tolerable.  Primary osteoarthritis of both hips-chronic pain.  H/O total knee replacement, right-doing well.  Primary osteoarthritis of left knee-she has been having increased left knee joint discomfort.  She also was diagnosed with recent stress fracture.  She has been followed by orthopedics.  She is using a walker currently.  She is  planning total knee replacement in future.  We had detailed discussion regarding weight loss diet and exercise.  Patient reports she had a DEXA by her GYN.  According to patient the DEXA was normal.  Idiopathic chronic gout of multiple sites without tophus - Treated with allopurinol and colchicine by her PCP.  Thrombocythemia ([place])  Other elevated white blood cell (WBC) count  History of depression  History of anemia  History of sleep apnea  History of migraine    Orders: No orders of the defined types were placed in this encounter.  No orders of the defined types were placed in this encounter.   Face-to-face time spent with patient was 30 minutes. >50% of time was spent in counseling and coordination of care.  Follow-Up Instructions: Return in about 6 months (around 11/07/2018) for Osteoarthritis, DDD, FMS.   Bo Merino, MD  Note - This record has been created using Editor, commissioning.  Chart creation errors have been sought, but may not always  have been located. Such creation errors do not reflect on  the standard of medical care.

## 2018-05-06 ENCOUNTER — Encounter: Payer: 59 | Attending: Surgery | Admitting: Skilled Nursing Facility1

## 2018-05-06 ENCOUNTER — Telehealth: Payer: Self-pay | Admitting: Cardiology

## 2018-05-06 ENCOUNTER — Encounter: Payer: Self-pay | Admitting: Skilled Nursing Facility1

## 2018-05-06 DIAGNOSIS — Z713 Dietary counseling and surveillance: Secondary | ICD-10-CM | POA: Insufficient documentation

## 2018-05-06 DIAGNOSIS — Z905 Acquired absence of kidney: Secondary | ICD-10-CM | POA: Insufficient documentation

## 2018-05-06 DIAGNOSIS — Z9049 Acquired absence of other specified parts of digestive tract: Secondary | ICD-10-CM | POA: Insufficient documentation

## 2018-05-06 DIAGNOSIS — K219 Gastro-esophageal reflux disease without esophagitis: Secondary | ICD-10-CM | POA: Insufficient documentation

## 2018-05-06 DIAGNOSIS — Z8249 Family history of ischemic heart disease and other diseases of the circulatory system: Secondary | ICD-10-CM | POA: Insufficient documentation

## 2018-05-06 DIAGNOSIS — Z6841 Body Mass Index (BMI) 40.0 and over, adult: Secondary | ICD-10-CM | POA: Insufficient documentation

## 2018-05-06 DIAGNOSIS — Z8379 Family history of other diseases of the digestive system: Secondary | ICD-10-CM | POA: Insufficient documentation

## 2018-05-06 DIAGNOSIS — Z841 Family history of disorders of kidney and ureter: Secondary | ICD-10-CM | POA: Diagnosis not present

## 2018-05-06 DIAGNOSIS — Z818 Family history of other mental and behavioral disorders: Secondary | ICD-10-CM | POA: Diagnosis not present

## 2018-05-06 DIAGNOSIS — Z823 Family history of stroke: Secondary | ICD-10-CM | POA: Insufficient documentation

## 2018-05-06 DIAGNOSIS — Z79899 Other long term (current) drug therapy: Secondary | ICD-10-CM | POA: Insufficient documentation

## 2018-05-06 DIAGNOSIS — Z888 Allergy status to other drugs, medicaments and biological substances status: Secondary | ICD-10-CM | POA: Insufficient documentation

## 2018-05-06 DIAGNOSIS — Z8261 Family history of arthritis: Secondary | ICD-10-CM | POA: Diagnosis not present

## 2018-05-06 NOTE — Telephone Encounter (Signed)
I spoke with patient. She states she is having a hard time falling asleep and staying asleep and requesting a sleep aid to help with insomnia. She states she was having the same symptoms prior to using the cpap. Pt states she is using her cpap nightly. She states she doesn't use much technology but she does have the tv on sometimes, I reviewed some sleep hygiene suggestions with pt and advised pt to follow up with primary MD as pt states she on on a lot of medication that could be causing some insomnia. She stated understanding and thankful for the call.

## 2018-05-06 NOTE — Telephone Encounter (Signed)
New Message:       Pt states she is just unable to sleep and wanting to know can something be prescribed to help her sleep better.

## 2018-05-06 NOTE — Progress Notes (Addendum)
Pre-Op Assessment Visit:  Pre-Operative  Surgery  Medical Nutrition Therapy:  Appt start time: 2:00  End time:  3:05  Patient was seen on 05/06/2018 for Pre-Operative Nutrition Assessment. Assessment and letter of approval faxed to [facility] Bariatric Surgery Program coordinator on 05/06/2018.   Pt states she has gastroparesis. Pt states she has a stress fracture in her knee. Pt states she snacks excessively at night. 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  Pt states she thinks maybe she should think twice about having surgery because she did not realize there was so much involved with it (making dietary/lifestyle changes).  Pt needs 6 more visits.  Pt expectation of surgery: to be healthier   Pt expectation of Dietitian: none  Start weight at [facility]: 225.8 BMI: 41.7  24 hr Dietary Recall: First Meal: fast food or fast food smoothie or yogurt Snack:  Second Meal: skipped or fast food or peanut butter jelly sandwich Snack: fruit Third Meal: spagetti and salad  Snack: ice cream and chips Beverages: diet caffeine free mountain dew, caffeine free sweet tea, water, juice.  Encouraged to engage in 150 minutes of moderate physical activity including cardiovascular and weight baring weekly  Handouts given during visit include:  . Pre-Op Goals . Bariatric Surgery Protein Shakes During the appointment today the following Pre-Op Goals were reviewed with the patient: . Track everything you eat and drink . Make healthy food choices . Begin to limit portion sizes . Limited concentrated sugars and fried foods . Keep fat/sugar in the single digits per serving on             food labels . Practice CHEWING your food  (aim for 30 chews per bite or until applesauce consistency) . Practice not drinking 15 minutes before, during, and 30 minutes after each meal/snack . Avoid all carbonated beverages  . Avoid/limit caffeinated beverages  . Avoid all sugar-sweetened  beverages . Consume 3 meals per day; eat every 3-5 hours . Make a list of non-food related activities . Aim for 64-100 ounces of FLUID daily  . Aim for at least 60-80 grams of PROTEIN daily . Look for a liquid protein source that contain ?15 g protein and ?5 g carbohydrate  (ex: shakes, drinks, shots)  -Follow diet recommendations listed below   Energy and Macronutrient Recomendations: Calories: 1400 Carbohydrate: 158 Protein: 105 Fat: 39  Demonstrated degree of understanding via:  Teach Back  Teaching Method Utilized:  Visual Auditory Hands on  Barriers to learning/adherence to lifestyle change: contemplative stage of change  Patient to call the Nutrition and Diabetes Education Services to enroll in Pre-Op and Post-Op Nutrition Education when surgery date is scheduled.

## 2018-05-08 ENCOUNTER — Encounter: Payer: Self-pay | Admitting: Rheumatology

## 2018-05-08 ENCOUNTER — Ambulatory Visit: Payer: 59 | Admitting: Rheumatology

## 2018-05-08 VITALS — BP 115/69 | HR 89 | Ht 63.0 in | Wt 226.0 lb

## 2018-05-08 DIAGNOSIS — M5136 Other intervertebral disc degeneration, lumbar region: Secondary | ICD-10-CM | POA: Diagnosis not present

## 2018-05-08 DIAGNOSIS — Z862 Personal history of diseases of the blood and blood-forming organs and certain disorders involving the immune mechanism: Secondary | ICD-10-CM | POA: Diagnosis not present

## 2018-05-08 DIAGNOSIS — Z8669 Personal history of other diseases of the nervous system and sense organs: Secondary | ICD-10-CM

## 2018-05-08 DIAGNOSIS — M5030 Other cervical disc degeneration, unspecified cervical region: Secondary | ICD-10-CM | POA: Diagnosis not present

## 2018-05-08 DIAGNOSIS — D72828 Other elevated white blood cell count: Secondary | ICD-10-CM

## 2018-05-08 DIAGNOSIS — Z8659 Personal history of other mental and behavioral disorders: Secondary | ICD-10-CM | POA: Diagnosis not present

## 2018-05-08 DIAGNOSIS — Z96651 Presence of right artificial knee joint: Secondary | ICD-10-CM

## 2018-05-08 DIAGNOSIS — M797 Fibromyalgia: Secondary | ICD-10-CM | POA: Diagnosis not present

## 2018-05-08 DIAGNOSIS — M160 Bilateral primary osteoarthritis of hip: Secondary | ICD-10-CM

## 2018-05-08 DIAGNOSIS — M1A09X0 Idiopathic chronic gout, multiple sites, without tophus (tophi): Secondary | ICD-10-CM

## 2018-05-08 DIAGNOSIS — M1712 Unilateral primary osteoarthritis, left knee: Secondary | ICD-10-CM

## 2018-05-08 DIAGNOSIS — D473 Essential (hemorrhagic) thrombocythemia: Secondary | ICD-10-CM

## 2018-05-08 DIAGNOSIS — D75839 Thrombocytosis, unspecified: Secondary | ICD-10-CM

## 2018-05-12 ENCOUNTER — Other Ambulatory Visit: Payer: Self-pay | Admitting: Rheumatology

## 2018-05-12 NOTE — Telephone Encounter (Signed)
Last Visit: 05/08/18 Next visit: 11/11/18  Okay to refill per Dr. Deveshwar 

## 2018-05-15 ENCOUNTER — Other Ambulatory Visit: Payer: Self-pay | Admitting: Rheumatology

## 2018-05-15 NOTE — Telephone Encounter (Signed)
Last Visit: 05/08/18 Next visit: 11/11/18  Okay to refill per Dr. Estanislado Pandy

## 2018-05-16 ENCOUNTER — Ambulatory Visit: Payer: 59

## 2018-05-16 ENCOUNTER — Other Ambulatory Visit: Payer: Self-pay | Admitting: Family Medicine

## 2018-05-16 ENCOUNTER — Telehealth: Payer: Self-pay

## 2018-05-16 NOTE — Telephone Encounter (Signed)
   [facility] Pre-operative Risk Assessment    Request for surgical clearance:  1. What type of surgery is being performed?  Left Total Knee Replacement   2. When is this surgery scheduled? 07/14/2018   3. What type of clearance is required (medical clearance vs. Pharmacy clearance to hold med vs. Both)? Both  4. Are there any medications that need to be held prior to surgery and how long? Not Listed  5. Practice name and name of physician performing surgery? [facility], Dr. Elmyra Ricks   6. What is your office phone number [phone] attn [place] Chard    7.   What is your office fax number [phone]  8.   Anesthesia type (None, local, MAC, general) ? Choice   Joaquim Lai 05/16/2018, 11:30 AM  _________________________________________________________________   (provider comments below)

## 2018-05-16 NOTE — Telephone Encounter (Signed)
   Primary Cardiologist: Jenkins Rouge, MD  Chart reviewed as part of pre-operative protocol coverage. Patient was contacted 05/16/2018 in reference to pre-operative risk assessment for pending surgery as outlined below.  JAZZMAN LOUGHMILLER was last seen on 01/24/2018 by Dr. Johnsie Cancel.  Since that day, [name] has done well. She had a cardiac cath on 01/01/18 that showed normal coronary arteries, normal LV function and mild pulmonary hypertension. An echo on 12/07/17 showed Normal LV size with mild LV hypertrophy. EF 60-65%. No significant valvular abnormalities..  Therefore, based on ACC/AHA guidelines, the patient would be at acceptable risk for the planned procedure without further cardiovascular testing.   I will route this recommendation to the requesting party via Epic fax function and remove from pre-op pool.  Please call with questions.  Daune Perch, NP 05/16/2018, 5:18 PM

## 2018-05-23 ENCOUNTER — Other Ambulatory Visit: Payer: Self-pay | Admitting: Rheumatology

## 2018-05-23 NOTE — Telephone Encounter (Signed)
Last Visit: 05/08/18 Next visit: 11/11/18  Okay to refill Nuvigil?

## 2018-05-23 NOTE — Telephone Encounter (Signed)
ok 

## 2018-06-03 DIAGNOSIS — M25562 Pain in left knee: Secondary | ICD-10-CM | POA: Diagnosis not present

## 2018-06-03 DIAGNOSIS — M1712 Unilateral primary osteoarthritis, left knee: Secondary | ICD-10-CM | POA: Diagnosis not present

## 2018-06-04 ENCOUNTER — Encounter: Payer: Medicare HMO | Attending: Surgery | Admitting: Skilled Nursing Facility1

## 2018-06-04 ENCOUNTER — Encounter: Payer: Self-pay | Admitting: Skilled Nursing Facility1

## 2018-06-04 DIAGNOSIS — Z905 Acquired absence of kidney: Secondary | ICD-10-CM | POA: Insufficient documentation

## 2018-06-04 DIAGNOSIS — K3184 Gastroparesis: Secondary | ICD-10-CM

## 2018-06-04 DIAGNOSIS — Z9049 Acquired absence of other specified parts of digestive tract: Secondary | ICD-10-CM | POA: Diagnosis not present

## 2018-06-04 DIAGNOSIS — Z823 Family history of stroke: Secondary | ICD-10-CM | POA: Insufficient documentation

## 2018-06-04 DIAGNOSIS — Z818 Family history of other mental and behavioral disorders: Secondary | ICD-10-CM | POA: Diagnosis not present

## 2018-06-04 DIAGNOSIS — Z841 Family history of disorders of kidney and ureter: Secondary | ICD-10-CM | POA: Insufficient documentation

## 2018-06-04 DIAGNOSIS — Z6841 Body Mass Index (BMI) 40.0 and over, adult: Secondary | ICD-10-CM | POA: Diagnosis not present

## 2018-06-04 DIAGNOSIS — Z888 Allergy status to other drugs, medicaments and biological substances status: Secondary | ICD-10-CM | POA: Diagnosis not present

## 2018-06-04 DIAGNOSIS — Z8261 Family history of arthritis: Secondary | ICD-10-CM | POA: Diagnosis not present

## 2018-06-04 DIAGNOSIS — Z8379 Family history of other diseases of the digestive system: Secondary | ICD-10-CM | POA: Diagnosis not present

## 2018-06-04 DIAGNOSIS — Z79899 Other long term (current) drug therapy: Secondary | ICD-10-CM | POA: Insufficient documentation

## 2018-06-04 DIAGNOSIS — Z8249 Family history of ischemic heart disease and other diseases of the circulatory system: Secondary | ICD-10-CM | POA: Diagnosis not present

## 2018-06-04 DIAGNOSIS — K219 Gastro-esophageal reflux disease without esophagitis: Secondary | ICD-10-CM | POA: Insufficient documentation

## 2018-06-04 DIAGNOSIS — Z713 Dietary counseling and surveillance: Secondary | ICD-10-CM | POA: Diagnosis present

## 2018-06-04 NOTE — Progress Notes (Signed)
RYGB Assessment:  1st SWL Appointment.  Pt needs 6 more visits.  Pt returns having lost about 7 pounds stating she has had dental surgery causing her not to be able to eat. Pt states she is getting knee surgery August 19th. Pt states she worked on chewing until applesauce consistency and kept a food journal. Pt states she has to adjust to her husband being retired. Pt states she did not write when she was eating spoon fulls of peanut butter.   Start Wt at N[facility]: 225.8 Wt: 218.7 BMI: 40.39   MEDICATIONS: See List   DIETARY INTAKE:  24-hr recall:  B ( AM): rice crispie cereal with banana or yogurt Snk ( AM): 1/2 cookie or fruit L ( PM): air fried chicken with french fries or bbq baked beans Snk ( PM): banana pudding and chocolate pie  D ( PM): grilled chicken and potatoes or bbq and banana pudding  Snk ( PM):  Beverages: water, gat[place]ae, diet mountain dew  Usual physical activity: ADL's  Diet to Follow: 1500 calories 170 g carbohydrates 112 g protein 42 g fat   Nutritional Diagnosis:  [place]-3.3 Overweight/obesity related to past poor dietary habits and physical inactivity as evidenced by patient w/ planned RYGB surgery following dietary guidelines for continued weight loss.    Intervention:  Nutrition counseling for upcoming Bariatric Surgery. Goals: -Encouraged to engage in 150 minutes of moderate physical activity including cardiovascular and weight baring weekly -Keep a food journal -Keep working on chewing until applesauce consistency -Aim to get in 64 fluid ounces a day -Keep working on not drinking mountain dew  -try alkaline water  Teaching Method Utilized:  Visual Auditory Hands on  Barriers to learning/adherence to lifestyle change: none identified   Demonstrated degree of understanding via:  Teach Back   Monitoring/Evaluation:  Dietary intake, exercise,and body weight prn.

## 2018-06-06 ENCOUNTER — Encounter: Payer: Self-pay | Admitting: Family Medicine

## 2018-06-06 ENCOUNTER — Ambulatory Visit (INDEPENDENT_AMBULATORY_CARE_PROVIDER_SITE_OTHER): Payer: Medicare HMO | Admitting: Family Medicine

## 2018-06-06 VITALS — BP 130/80 | HR 81 | Temp 98.5°F | Resp 16 | Wt 219.6 lb

## 2018-06-06 DIAGNOSIS — R05 Cough: Secondary | ICD-10-CM

## 2018-06-06 DIAGNOSIS — J069 Acute upper respiratory infection, unspecified: Secondary | ICD-10-CM | POA: Diagnosis not present

## 2018-06-06 DIAGNOSIS — R058 Other specified cough: Secondary | ICD-10-CM

## 2018-06-06 NOTE — Progress Notes (Signed)
Subjective:  [name] is a 62 y.o. female who presents for a 2-3 week history of dry cough during the daytime and at night as well. She occasionally gags from coughing. Reports wheezing 2 days ago used her albuterol inhaler twice that day. Wheezing has improved.  Right ear starting bothering her yesterday and throat is scratchy.   Denies fever, chills, sinus pain, rhinorrhea, chest pain, palpitations, shortness of breath, abdominal pain, N/V/D, LE edema.   Treatment to date: Mucinex twice daily, Deslym.  Used Tessalon last night.  Denies sick contacts.  No other aggravating or relieving factors.  No other c/o.  She has seen Dr. Leeanne Deed pulmonologist in the past.  Underlying allergies that she is treating.  Does not smoke.  Recent amoxil for dental procedure.   ROS as in subjective.   Objective: Vitals:   06/06/18 1103  BP: 130/80  Pulse: 81  Resp: 16  Temp: 98.5 F (36.9 C)  SpO2: 95%    General appearance: Alert, WD/WN, no distress, well appearing                             Skin: warm, dry, no rash, no pallor                           Head: no sinus tenderness                            Eyes: conjunctiva normal, corneas clear, PERRLA                            Ears: pearly TMs, external ear canals normal                          Nose: septum midline, turbinates swollen, with erythema and no discharge             Mouth/throat: MMM, tongue normal, mild pharyngeal erythema without edema or exudate                            Neck: supple, no adenopathy, no thyromegaly, nontender                          Heart: RRR, normal S1, S2, no murmurs                         Lungs: CTA bilaterally, no wheezes, rales, or rhonchi      Assessment: Viral upper respiratory tract infection  Dry cough   Plan: Discussed diagnosis and treatment of viral URI. Suggested symptomatic OTC remedies. She will continue treating her allergies and with Mucinex or Delsym. She has Tessalon at home  and will try these. Use albuterol if needed. Salt water gargles for throat irritation.  Nasal saline spray for congestion.  Tylenol or Ibuprofen OTC for fever and malaise.  Call/return in 2-3 days if symptoms aren't resolving or sooner if needed.

## 2018-06-06 NOTE — Patient Instructions (Signed)
I suspect your symptoms are related to a viral illness and recommend treating your symptoms at this point.  Mucinex for congestion and cough, drink extra water, use salt water gargles for throat irritation and Tylenol or Ibuprofen for aches and pains.  Take the Tessalon as well.  Call if you are getting worse or if you are not improving in 2-3 days.

## 2018-06-12 ENCOUNTER — Other Ambulatory Visit: Payer: Self-pay | Admitting: Family Medicine

## 2018-06-14 ENCOUNTER — Other Ambulatory Visit: Payer: Self-pay | Admitting: Family Medicine

## 2018-06-15 DIAGNOSIS — G4733 Obstructive sleep apnea (adult) (pediatric): Secondary | ICD-10-CM | POA: Diagnosis not present

## 2018-06-16 ENCOUNTER — Ambulatory Visit (INDEPENDENT_AMBULATORY_CARE_PROVIDER_SITE_OTHER): Payer: Medicare HMO | Admitting: Family Medicine

## 2018-06-16 ENCOUNTER — Encounter: Payer: Self-pay | Admitting: Family Medicine

## 2018-06-16 ENCOUNTER — Telehealth: Payer: Self-pay

## 2018-06-16 VITALS — BP 110/80 | HR 82 | Temp 98.4°F | Ht 63.0 in | Wt 221.0 lb

## 2018-06-16 DIAGNOSIS — R197 Diarrhea, unspecified: Secondary | ICD-10-CM | POA: Diagnosis not present

## 2018-06-16 DIAGNOSIS — R531 Weakness: Secondary | ICD-10-CM | POA: Diagnosis not present

## 2018-06-16 DIAGNOSIS — R42 Dizziness and giddiness: Secondary | ICD-10-CM | POA: Diagnosis not present

## 2018-06-16 DIAGNOSIS — E860 Dehydration: Secondary | ICD-10-CM

## 2018-06-16 LAB — POCT URINALYSIS DIP (PROADVANTAGE DEVICE)
Bilirubin, UA: NEGATIVE
Blood, UA: NEGATIVE
Glucose, UA: NEGATIVE mg/dL
Ketones, POC UA: NEGATIVE mg/dL
Nitrite, UA: NEGATIVE
Specific Gravity, Urine: 1.03
Urobilinogen, Ur: NEGATIVE
pH, UA: 6 (ref 5.0–8.0)

## 2018-06-16 NOTE — Telephone Encounter (Signed)
Pt stated she woke up with vertigo last Friday morning and she took Meclizine. She stated things are not spinning around anymore but she still does not feel right. Pt was advised that she should be seen. Pt has asked for a call back from [place].

## 2018-06-16 NOTE — Patient Instructions (Signed)
  If you have any persistent or worsening diarrhea, fever, abdominal pain, blood in the stool, we will need to do further evaluation, and collect stool samples. (evaluate for C. Diff infection).   Avoid dairy, drink plenty of fluids.  Bland diet (BRAT--bananas, rice, applesauce and rice)  You may continue to use the meclizine if needed for equilibrium/balance/vertigo issues. Do not use along with compazine (they can both have similar side effects, including sedation).

## 2018-06-16 NOTE — Telephone Encounter (Signed)
Patient advised to come in for 3:00 appt.

## 2018-06-16 NOTE — Progress Notes (Signed)
Chief Complaint  Patient presents with  . Dizziness    started early Fri am, took meclizine and it did helped some-not really dizzy anymore. Room is not spinning. Feels lightheaded and she can lose her balance. Feel somewhat nauseated. Had diarrhea yesterday until mid day-did take some imodium. No fevers or vomiting. Appetite isn't great. Right ear is full feeling. Just doesn't feel right-slight HA.    Woke up early Friday morning to go to the bathroom, and fell after losing her balance on the way.  No injury. Had some nausea that day. She had room spinning/vertigo on Friday and Saturday. Meclizine helped.  Thought she was better yesterday. Just slight spinning in the morning. She then had bad diarrhea yesterday; vertigo/dizziness better by mid-day, was feeling better.  Today she feels weak, lightheaded, no longer having vertigo.  Very lightheaded after leaning forward.  Had 5 episodes of diarrhea yesterday. Watery, no blood or mucus.  Denies abdominal pain.  No fever, chills, sick contacts. Today has nausea, no vomiting.  Took compazine--still nauseated Chronic dry mouth from meds, not worse.  Denies allergy or cold symptoms. Had URI on 7/12 when she saw Vickie. No longer taking mucinex or tessalon perles. Recent ABX for dental procedure (4 pills of amox on 7/9).  No headache, other neuro symptoms, confusion. She reports drinking a lot of fluids, made herself drink some gatorade today. No muscle cramps. No sick contacts She is accompanied by her husband today.  PMH, PSH, SH reviewed  Outpatient Encounter Medications as of 06/16/2018  Medication Sig Note  . allopurinol (ZYLOPRIM) 300 MG tablet TAKE 1 TABLET (300 MG TOTAL) DAILY BY MOUTH.   Marland Kitchen ALPRAZolam (XANAX) 0.25 MG tablet Take 0.25 mg by mouth 3 (three) times daily as needed for anxiety.    . ARIPiprazole (ABILIFY) 5 MG tablet Take 5 mg by mouth daily.   . Armodafinil 250 MG tablet TAKE 1 TABLET EVERY DAY WITH BREAKFAST   .  atorvastatin (LIPITOR) 40 MG tablet TAKE 1 TABLET BY MOUTH EVERY DAY   . cholecalciferol (VITAMIN D) 1000 units tablet Take 1,000 Units by mouth daily.   . Cyanocobalamin (VITAMIN B-12 PO) Take by mouth.   . DULoxetine (CYMBALTA) 60 MG capsule Take 60 mg by mouth 2 (two) times daily.    . fexofenadine (ALLEGRA) 180 MG tablet Take 180 mg by mouth daily.   . fluticasone (FLONASE) 50 MCG/ACT nasal spray PLACE 2 SPRAYS INTO BOTH NOSTRILS DAILY AS NEEDED FOR ALLERGIES   . loperamide (IMODIUM A-D) 2 MG tablet Take 2 mg by mouth 4 (four) times daily as needed for diarrhea or loose stools.   . meloxicam (MOBIC) 15 MG tablet Take 15 mg by mouth daily.   . methocarbamol (ROBAXIN) 500 MG tablet TAKE 1 TABLET AT 7AM AND AT 2PM   . metoprolol tartrate (LOPRESSOR) 50 MG tablet Take 1 tablet (50 mg total) by mouth 2 (two) times daily.   . Multiple Vitamins-Minerals (ICAPS AREDS 2) CAPS Take 1 capsule by mouth 2 (two) times daily.   . nortriptyline (PAMELOR) 25 MG capsule Take 75-100 mg by mouth at bedtime. 04/09/2018: Takes 3qHS  . Omega-3 Fatty Acids (FISH OIL PO) Take by mouth.   . pantoprazole (PROTONIX) 40 MG tablet Take 40 mg by mouth 2 (two) times daily.   . Probiotic Product ([place]) CAPS Take 1 capsule by mouth daily. Reported on 01/02/2016   . prochlorperazine (COMPAZINE) 10 MG tablet Take 10 mg by mouth every 6 (six)  hours as needed for nausea or vomiting. Reported on 05/09/2016   . Propylene Glycol (SYSTANE COMPLETE) 0.6 % SOLN Place 1 drop into both eyes 4 (four) times daily.   . ranitidine (ZANTAC) 300 MG tablet Take 300 mg by mouth 2 (two) times daily.    Marland Kitchen topiramate (TOPAMAX) 25 MG tablet TAKE 2 TABLETS (50 MG TOTAL) BY MOUTH AT BEDTIME.   Marland Kitchen albuterol (PROVENTIL HFA;VENTOLIN HFA) 108 (90 Base) MCG/ACT inhaler Inhale 1-2 puffs into the lungs every 6 (six) hours as needed for wheezing or shortness of breath. Reported on 05/30/2016 (Patient not taking: Reported on 06/16/2018)   .  aspirin-acetaminophen-caffeine (EXCEDRIN MIGRAINE) 250-250-65 MG tablet Take 1-2 tablets by mouth every 6 (six) hours as needed for headache.   . colchicine 0.6 MG tablet Take 2 tablets by mouth at onset of gout flare. Taken an additional tablet 1 hour later, if needed. May take like this every 3d, if needed (Patient not taking: Reported on 06/16/2018)   . guaiFENesin (MUCINEX) 600 MG 12 hr tablet Take 600 mg by mouth 2 (two) times daily.   . ondansetron (ZOFRAN) 8 MG tablet Take 8 mg by mouth every 8 (eight) hours as needed for nausea or vomiting.   . promethazine (PHENERGAN) 25 MG suppository Place 25 mg rectally every 6 (six) hours as needed for nausea or vomiting. Reported on 05/09/2016   . [DISCONTINUED] benzonatate (TESSALON) 200 MG capsule Take 1 capsule (200 mg total) by mouth 3 (three) times daily as needed.   . [DISCONTINUED] carbamazepine (TEGRETOL XR) 100 MG 12 hr tablet Take 200 mg by mouth daily.     . [DISCONTINUED] Eszopiclone 3 MG TABS Take 3 mg by mouth at bedtime. Take immediately before bedtime   . [DISCONTINUED] fluticasone (FLONASE) 50 MCG/ACT nasal spray PLACE 2 SPRAYS INTO BOTH NOSTRILS DAILY AS NEEDED FOR ALLERGIES    No facility-administered encounter medications on file as of 06/16/2018.    Allergies  Allergen Reactions  . Erythromycin Nausea Only  . Tramadol Itching  . Dilaudid [Hydromorphone Hcl] Itching   ROS: no fever, chills. Recent URI, resolved.  No sinus pain, ear pain, hearing loss, cough, shortness of breath, chest pain, numbness, tingling. Vertigo resolved, but persistent dizziness (described as LH, slightly off-balance) and mild nausea.  No bleeding, bruising, rash. No urinary complaints.  Diarrhea yesterday, none today.  See HPI   PHYSICAL EXAM:  BP 110/80 (BP Location: Right Arm, Cuff Size: Normal)   Pulse 82   Temp 98.4 F (36.9 C) (Tympanic)   Ht 5\' 3"  (1.6 m)   Wt 221 lb (100.2 kg)   LMP 07/27/2006   BMI 39.15 kg/m   Well appearing, somewhat  tired, in no distress HEENT: conjunctiva and sclera are clear, EOMI. Mouth appears dry, no erythema, exudates. Sinuses nontender. TM's and EAC's normal. Nasal mucosa normal. Neck: no lymphadenopathy or mass Heart: regular rate and rhythm, no murmur Lungs: clear bilaterally Abdomen: normal bowel sounds, soft. Mild diffuse tenderness, no focal tenderness, rebound, guarding or mass. Extremities: no edema Skin: slightly dry  Not orthostatic per VS (10pt increase in pulse only, no significant drop in BP)  Only able to give extremely small urine sample, after drinking water. SG 1.030, otherwise normal.  ASSESSMENT/PLAN:  Dehydration - related to recent diarrhea, nausea/vertigo. Encouraged adequate hydration  Lightheaded - Plan: CBC with Differential/Platelet, Comprehensive metabolic panel, POCT Urinalysis DIP (Proadvantage Device)  Diarrhea, unspecified type - better today; counseled re: diet, need for f/u if persistent, worsening pain, fever  or bloody stools. Recent ABX for dentist, check Cdiff if persistent - Plan: CBC with Differential/Platelet, Comprehensive metabolic panel, POCT Urinalysis DIP (Proadvantage Device)  Vertigo - over the weekend, resolving; meclizine prn  Weakness - suspect related to diarrhea and dehydration. Check labs. Hydrate  30 min visit, more than 1/2 spent counseling.   If you have any persistent or worsening diarrhea, fever, abdominal pain, blood in the stool, we will need to do further evaluation, and collect stool samples. (evaluate for C. Diff infection).   Avoid dairy, drink plenty of fluids.  Bland diet (BRAT--bananas, rice, applesauce and rice)  You may continue to use the meclizine if needed for equilibrium/balance/vertigo issues. Do not use along with compazine (they can both have similar side effects, including sedation).

## 2018-06-17 LAB — COMPREHENSIVE METABOLIC PANEL
ALT: 51 IU/L — ABNORMAL HIGH (ref 0–32)
AST: 49 IU/L — ABNORMAL HIGH (ref 0–40)
Albumin/Globulin Ratio: 1.3 (ref 1.2–2.2)
Albumin: 3.5 g/dL — ABNORMAL LOW (ref 3.6–4.8)
Alkaline Phosphatase: 142 IU/L — ABNORMAL HIGH (ref 39–117)
BUN/Creatinine Ratio: 21 (ref 12–28)
BUN: 17 mg/dL (ref 8–27)
Bilirubin Total: 0.2 mg/dL (ref 0.0–1.2)
CO2: 22 mmol/L (ref 20–29)
Calcium: 8.9 mg/dL (ref 8.7–10.3)
Chloride: 103 mmol/L (ref 96–106)
Creatinine, Ser: 0.82 mg/dL (ref 0.57–1.00)
GFR calc Af Amer: 89 mL/min/{1.73_m2} (ref >59)
GFR calc non Af Amer: 77 mL/min/{1.73_m2} (ref >59)
Globulin, Total: 2.8 g/dL (ref 1.5–4.5)
Glucose: 92 mg/dL (ref 65–99)
Potassium: 4.9 mmol/L (ref 3.5–5.2)
Sodium: 142 mmol/L (ref 134–144)
Total Protein: 6.3 g/dL (ref 6.0–8.5)

## 2018-06-17 LAB — CBC WITH DIFFERENTIAL/PLATELET
Basophils Absolute: 0 10*3/uL (ref 0.0–0.2)
Basos: 0 %
EOS (ABSOLUTE): 0.3 10*3/uL (ref 0.0–0.4)
Eos: 3 %
Hematocrit: 37.5 % (ref 34.0–46.6)
Hemoglobin: 11.6 g/dL (ref 11.1–15.9)
Immature Grans (Abs): 0.1 10*3/uL (ref 0.0–0.1)
Immature Granulocytes: 1 %
Lymphocytes Absolute: 2 10*3/uL (ref 0.7–3.1)
Lymphs: 22 %
MCH: 28 pg (ref 26.6–33.0)
MCHC: 30.9 g/dL — ABNORMAL LOW (ref 31.5–35.7)
MCV: 91 fL (ref 79–97)
Monocytes Absolute: 1 10*3/uL — ABNORMAL HIGH (ref 0.1–0.9)
Monocytes: 11 %
Neutrophils Absolute: 5.7 10*3/uL (ref 1.4–7.0)
Neutrophils: 63 %
Platelets: 387 10*3/uL (ref 150–450)
RBC: 4.14 x10E6/uL (ref 3.77–5.28)
RDW: 15.3 % (ref 12.3–15.4)
WBC: 9 10*3/uL (ref 3.4–10.8)

## 2018-06-19 ENCOUNTER — Ambulatory Visit
Admission: RE | Admit: 2018-06-19 | Discharge: 2018-06-19 | Disposition: A | Discharge status: Home / Self Care | Payer: Medicare HMO | Source: Ambulatory Visit | Attending: Family Medicine | Admitting: Family Medicine

## 2018-06-19 ENCOUNTER — Ambulatory Visit: Payer: Self-pay

## 2018-06-19 DIAGNOSIS — Z1231 Encounter for screening mammogram for malignant neoplasm of breast: Secondary | ICD-10-CM

## 2018-06-19 NOTE — H&P (Signed)
TOTAL KNEE ADMISSION H&P  Patient is being admitted for left total knee arthroplasty.  Subjective:  Chief Complaint:left knee pain.  HPI: [name], 62 y.o. female, has a history of pain and functional disability in the left knee due to arthritis and has failed non-surgical conservative treatments for greater than 12 weeks to includeNSAID's and/or analgesics, corticosteriod injections and viscosupplementation injections.  Onset of symptoms was abrupt, starting 1 year ago with rapidly worsening course since that time. The patient noted no past surgery on the left knee(s).  Patient currently rates pain in the left knee(s) at 7 out of 10 with activity. Patient has worsening of pain with activity and weight bearing, crepitus and instability.  Patient has evidence of bone-on-bone arthritis in the patellofemoral joint as well as medial with a massive stress reaction in the medial femoral condyle and in the intercondylar area of the tibia by imaging studies. There is no active infection.  Patient Active Problem List   Diagnosis Date Noted  . Dyspnea 12/26/2017  . Restrictive lung disease secondary to obesity 12/26/2017  . Atypical chest pain 12/06/2017  . Sinus tachycardia 12/06/2017  . Chest pain 12/06/2017  . DJD (degenerative joint disease), cervical 12/24/2016  . Primary osteoarthritis of both hips 12/24/2016  . Primary osteoarthritis of both knees 12/24/2016  . H/O total knee replacement, right 12/24/2016  . Spondylosis of lumbar region without myelopathy or radiculopathy 12/24/2016  . Acute gout 05/30/2016  . Myalgia 03/29/2016  . Other long term (current) drug therapy 03/29/2016  . Idiopathic peripheral neuropathy 03/29/2016  . Cannot sleep 03/29/2016  . Migraine without aura and responsive to treatment 03/29/2016  . Multifocal myoclonus 03/29/2016  . Restless leg 03/29/2016  . Has a tremor 03/29/2016  . History of aspiration pneumonitis 01/25/2016  . History of acute bronchitis  01/25/2016  . LPRD (laryngopharyngeal reflux disease) 01/25/2016  . Imbalance 01/09/2016  . Serotonin syndrome 12/22/2015  . OA (osteoarthritis) of knee 09/05/2015  . Obesity 05/17/2015  . OSA (obstructive sleep apnea) 03/16/2013  . Iron deficiency 11/06/2012  . Thrombocythemia ([place]) 11/06/2012  . Leukocytosis 11/05/2012  . Impaired fasting glucose 07/23/2012  . Bronchitis 04/03/2012  . Kidney stone on left side 03/05/2012  . Kidney cysts 03/04/2012  . Loose stools 03/04/2012  . Urinary frequency 12/04/2011  . Restless leg syndrome 12/04/2011  . Polypharmacy 12/04/2011  . Bladder incontinence 12/04/2011  . Fibromyalgia   . Hyperlipidemia   . Edema   . S/P endometrial ablation   . Allergic rhinitis 09/18/2011  . Depression, major, single episode, in partial remission ([place]) 04/21/2011  . PRECORDIAL PAIN 02/17/2010  . Anxiety state 01/30/2010  . Migraine 01/30/2010  . GERD 01/30/2010  . Gastroparesis 01/30/2010   Past Medical History:  Diagnosis Date  . Anemia    previously followed by Dr. Jamse Arn for anemia and elevated platelets  . Anxiety   . C. difficile colitis 10/01/12   treated by [facility]  . Chronic fatigue syndrome   . DDD (degenerative disc disease), lumbar 08/19/14   and facet arthroplasty & left lumbar radiculopathy (Dr.Ramos)  . Depression   . Dyssynergia    dyssynergenic defecation, contributing to fecal incontinence.  . Edema   . Fibromyalgia   . Gastroparesis    followed at [facility]  . GERD (gastroesophageal reflux disease)   . Hyperlipidemia   . Kidney stone   . Lumbar radiculopathy   . Migraine   . Neuropathy   . Obstructive sleep apnea    Does  not wear CPAP  . Paresthesia    [facility] Everette [facility]  . Pelvic floor dysfunction    pelvic floor dyssynergy  . Plantar fasciitis 02/2011   R foot  . Pneumonia    2012  . PONV (postoperative nausea and vomiting)    pt states has gastroparesis has difficulty taking antibiotics and narcotics has  severe nausea and vomiting   . Restless leg syndrome   . S/P endometrial ablation 08/09/2006   Novasure Ablation  . S/P epidural steroid injection 09/20/14   Dr.Ramos  . Tremor    Dr. Everette Rank  . Urinary frequency   . Urinary incontinence     Past Surgical History:  Procedure Laterality Date  . CHOLECYSTECTOMY  9/05  . ENDOMETRIAL ABLATION  08/09/2006   Dr. Valentina Shaggy Ablation  . FACET JOINT INJECTION  04/17/2017   Left L4-5 and L5-S1  . KNEE ARTHROPLASTY    . KNEE SURGERY  1999   R knee, Dr. Eddie Dibbles, torn cartilage  . RIGHT/LEFT HEART CATH AND CORONARY ANGIOGRAPHY N/A 01/01/2018   Procedure: RIGHT/LEFT HEART CATH AND CORONARY ANGIOGRAPHY;  Surgeon: Sherren Mocha, MD;  Location: [place] [facility];  Service: Cardiovascular;  Laterality: N/A;  . TONSILLECTOMY  1968  . TONSILLECTOMY    . TOTAL KNEE ARTHROPLASTY Right 09/05/2015   Procedure: RIGHT TOTAL KNEE ARTHROPLASTY;  Surgeon: Gaynelle Arabian, MD;  Location: [facility];  Service: Orthopedics;  Laterality: Right;  . ULTRASOUND GUIDANCE FOR VASCULAR ACCESS  01/01/2018   Procedure: Ultrasound Guidance For Vascular Access;  Surgeon: Sherren Mocha, MD;  Location: [place] [facility];  Service: Cardiovascular;;    No current facility-administered medications for this encounter.    Current Outpatient Medications  Medication Sig Dispense Refill Last Dose  . albuterol (PROVENTIL HFA;VENTOLIN HFA) 108 (90 Base) MCG/ACT inhaler Inhale 1-2 puffs into the lungs every 6 (six) hours as needed for wheezing or shortness of breath. Reported on 05/30/2016 (Patient not taking: Reported on 06/16/2018) 1 Inhaler 3 Not Taking  . allopurinol (ZYLOPRIM) 300 MG tablet TAKE 1 TABLET (300 MG TOTAL) DAILY BY MOUTH. 90 tablet 0 Taking  . ALPRAZolam (XANAX) 0.25 MG tablet Take 0.25 mg by mouth 3 (three) times daily as needed for anxiety.    Taking  . ARIPiprazole (ABILIFY) 5 MG tablet Take 5 mg by mouth daily.  5 Taking  . Armodafinil 250 MG tablet TAKE 1 TABLET  EVERY DAY WITH BREAKFAST 90 tablet 0 Taking  . aspirin-acetaminophen-caffeine (EXCEDRIN MIGRAINE) 250-250-65 MG tablet Take 1-2 tablets by mouth every 6 (six) hours as needed for headache.   Not Taking  . atorvastatin (LIPITOR) 40 MG tablet TAKE 1 TABLET BY MOUTH EVERY DAY 90 tablet 0 Taking  . cholecalciferol (VITAMIN D) 1000 units tablet Take 1,000 Units by mouth daily.   Taking  . colchicine 0.6 MG tablet Take 2 tablets by mouth at onset of gout flare. Taken an additional tablet 1 hour later, if needed. May take like this every 3d, if needed (Patient not taking: Reported on 06/16/2018) 30 tablet 0 Not Taking  . Cyanocobalamin (VITAMIN B-12 PO) Take by mouth.   Taking  . DULoxetine (CYMBALTA) 60 MG capsule Take 60 mg by mouth 2 (two) times daily.    Taking  . fexofenadine (ALLEGRA) 180 MG tablet Take 180 mg by mouth daily.   Taking  . fluticasone (FLONASE) 50 MCG/ACT nasal spray PLACE 2 SPRAYS INTO BOTH NOSTRILS DAILY AS NEEDED FOR ALLERGIES 16 g 1 Taking  . guaiFENesin ([place])  600 MG 12 hr tablet Take 600 mg by mouth 2 (two) times daily.   Not Taking  . loperamide (IMODIUM A-D) 2 MG tablet Take 2 mg by mouth 4 (four) times daily as needed for diarrhea or loose stools.   Taking  . meloxicam (MOBIC) 15 MG tablet Take 15 mg by mouth daily.   Taking  . methocarbamol (ROBAXIN) 500 MG tablet TAKE 1 TABLET AT 7AM AND AT 2PM 60 tablet 0 Taking  . metoprolol tartrate (LOPRESSOR) 50 MG tablet Take 1 tablet (50 mg total) by mouth 2 (two) times daily. 180 tablet 3 Taking  . Multiple Vitamins-Minerals (ICAPS AREDS 2) CAPS Take 1 capsule by mouth 2 (two) times daily.   Taking  . nortriptyline (PAMELOR) 25 MG capsule Take 75-100 mg by mouth at bedtime.   Taking  . Omega-3 Fatty Acids (FISH OIL PO) Take by mouth.   Taking  . ondansetron (ZOFRAN) 8 MG tablet Take 8 mg by mouth every 8 (eight) hours as needed for nausea or vomiting.   Not Taking  . pantoprazole (PROTONIX) 40 MG tablet Take 40 mg by mouth 2 (two)  times daily.   Taking  . Probiotic Product ([place]) CAPS Take 1 capsule by mouth daily. Reported on 01/02/2016   Taking  . prochlorperazine (COMPAZINE) 10 MG tablet Take 10 mg by mouth every 6 (six) hours as needed for nausea or vomiting. Reported on 05/09/2016   Taking  . promethazine (PHENERGAN) 25 MG suppository Place 25 mg rectally every 6 (six) hours as needed for nausea or vomiting. Reported on 05/09/2016   Not Taking  . Propylene Glycol (SYSTANE COMPLETE) 0.6 % SOLN Place 1 drop into both eyes 4 (four) times daily.   Taking  . ranitidine (ZANTAC) 300 MG tablet Take 300 mg by mouth 2 (two) times daily.    Taking  . topiramate (TOPAMAX) 25 MG tablet TAKE 2 TABLETS (50 MG TOTAL) BY MOUTH AT BEDTIME. 180 tablet 0 Taking   Allergies  Allergen Reactions  . Erythromycin Nausea Only  . Tramadol Itching  . Dilaudid [Hydromorphone Hcl] Itching    Social History   Tobacco Use  . Smoking status: Never Smoker  . Smokeless tobacco: Never Used  Substance Use Topics  . Alcohol use: No    Alcohol/week: 0.0 oz    Family History  Problem Relation Age of Onset  . Allergies Mother   . Hypertension Mother   . Heart disease Mother        possible valve problem - leaking valve  . Macular degeneration Mother   . Heart disease Father        pacemaker, CHF  . Hypertension Father   . Diabetes Father        borderline  . Stroke Father 15  . Kidney disease Father   . Asthma Sister   . Irritable bowel syndrome Sister   . Allergies Sister   . Heart disease Paternal Grandmother   . Heart disease Paternal Grandfather   . Cancer Maternal Aunt        leukemia  . Cancer Maternal Aunt   . Colon cancer Maternal Aunt        late 52's  . CAD Neg Hx      Review of Systems  Constitutional: Negative for chills and fever.  HENT: Negative for congestion, sore throat and tinnitus.   Eyes: Negative for double vision, photophobia and pain.  Respiratory: Negative for cough, shortness of breath  and wheezing.  Cardiovascular: Negative for chest pain, palpitations and orthopnea.  Gastrointestinal: Negative for heartburn, nausea and vomiting.  Genitourinary: Negative for dysuria, frequency and urgency.  Musculoskeletal: Positive for joint pain.  Neurological: Negative for dizziness, weakness and headaches.  Psychiatric/Behavioral: Negative for depression.    Objective:  Physical Exam  Obese and well developed. General: Alert and oriented x3, cooperative and pleasant, no acute distress. Head: normocephalic, atraumatic, neck supple. Eyes: EOMI. Respiratory: breath sounds clear in all fields, no wheezing, rales, or rhonchi. Cardiovascular: Regular rate and rhythm, no murmurs, gallops or rubs.  Abdomen: non-tender to palpation and soft, normoactive bowel sounds. Musculoskeletal: Left Knee Exam:  No effusion. Range of motion is 5-125 degrees. No crepitus on range of motion of the knee. Diffusely tender over the medial aspect of the knee. No lateral joint line tenderness. Stable knee. Calves soft and nontender. Motor function intact in LE. Strength 5/5 LE bilaterally. Neuro: Distal pulses 2+. Sensation to light touch intact in LE.  Vital signs in last 24 hours: Blood pressure: 108/68 mmHg Pulse: 84 bpm  Labs:   Estimated body mass index is 39.15 kg/m as calculated from the following:   Height as of 06/16/18: 5\' 3"  (1.6 m).   Weight as of 06/16/18: 100.2 kg (221 lb).   Imaging Review Plain radiographs demonstrate severe degenerative joint disease of the left knee(s). The overall alignment ismild varus. The bone quality appears to be adequate for age and reported activity level.   Preoperative templating of the joint replacement has been completed, documented, and submitted to the Operating Room personnel in order to optimize intra-operative equipment management.   Anticipated LOS equal to or greater than 2 midnights due to - Age 15 and older with one or more of the  following:  - Obesity  - Expected need for hospital services (PT, OT, Nursing) required for safe  discharge  - Anticipated need for postoperative skilled nursing care or inpatient rehab  - Active co-morbidities: None OR   - Unanticipated findings during/Post Surgery: None  - Patient is a high risk of re-admission due to: None   Assessment/Plan:  End stage arthritis, left knee   The patient history, physical examination, clinical judgment of the provider and imaging studies are consistent with end stage degenerative joint disease of the left knee(s) and total knee arthroplasty is deemed medically necessary. The treatment options including medical management, injection therapy arthroscopy and arthroplasty were discussed at length. The risks and benefits of total knee arthroplasty were presented and reviewed. The risks due to aseptic loosening, infection, stiffness, patella tracking problems, thromboembolic complications and other imponderables were discussed. The patient acknowledged the explanation, agreed to proceed with the plan and consent was signed. Patient is being admitted for inpatient treatment for surgery, pain control, PT, OT, prophylactic antibiotics, VTE prophylaxis, progressive ambulation and ADL's and discharge planning. The patient is planning to be discharged home with outpatient physical therapy.   Therapy Plans: outpatient physical therapy at [facility] Disposition: Home with husband Planned DVT Prophylaxis: aspirin 325 mg BID DME needed: 3-n-1 PCP: Dr. Tomi Bamberger (medical clearance provided) Cardiologist: Dr. Jenkins Rouge (cardiac clearance provided) TXA: IV Allergies: Tramadol (itching), erythromycin, dilaudid (itching)  - Patient was instructed on what medications to stop prior to surgery. - Follow-up visit in 2 weeks with Dr. Wynelle Link - Begin physical therapy following surgery - Pre-operative lab work as pre-surgical testing - Prescriptions will be provided in hospital  at time of discharge  Theresa Duty, PA-C Orthopedic Surgery [facility] [place]

## 2018-06-24 ENCOUNTER — Other Ambulatory Visit: Payer: Self-pay | Admitting: Family Medicine

## 2018-06-24 DIAGNOSIS — E785 Hyperlipidemia, unspecified: Secondary | ICD-10-CM

## 2018-07-04 DIAGNOSIS — G4733 Obstructive sleep apnea (adult) (pediatric): Secondary | ICD-10-CM | POA: Diagnosis not present

## 2018-07-07 ENCOUNTER — Other Ambulatory Visit: Payer: Self-pay | Admitting: Family Medicine

## 2018-07-07 NOTE — Progress Notes (Signed)
05/16/2018- Cardiac Clearance from Dr. Johnsie Cancel on chart.  05/15/2018- medical clearance on chart from Dr. Tomi Bamberger.  05/02/2018- noted in Epic-CXR  01/01/2018- noted in [facility]  12/18/2017- noted in [place]  12/07/2017- noted in [place]

## 2018-07-07 NOTE — Patient Instructions (Addendum)
[name]  07/07/2018   Your procedure is scheduled on: Monday 07/14/2018  Report to [facility] Main  Entrance              Report to admitting at   [phone] AM    Call this number if you have problems the morning of surgery [phone]    Remember: Do not eat food or drink liquids :After Midnight.               Please bring CPAP mask and tubing with you morning of surgery!   Take these medicines the morning of surgery with A SIP OF WATER: use Albuterol inhaler if needed and bring it with you to the hospital, Aripiprazole (Abilify), Duloxetine (Cymbalta), Metoprolol tartrate (Lopressor),             Pantoprazole (Protonix)                                You may not have any metal on your body including hair pins and              piercings  Do not wear jewelry, make-up, lotions, powders or perfumes, deodorant             Do not wear nail polish.  Do not shave  48 hours prior to surgery.                Do not bring valuables to the hospital. [place].  Contacts, dentures or bridgework may not be worn into surgery.  Leave suitcase in the car. After surgery it may be brought to your room.                  Please read over the following fact sheets you were given: _____________________________________________________________________             [facility] - Preparing for Surgery Before surgery, you can play an important role.  Because skin is not sterile, your skin needs to be as free of germs as possible.  You can reduce the number of germs on your skin by washing with CHG (chlorahexidine gluconate) soap before surgery.  CHG is an antiseptic cleaner which kills germs and bonds with the skin to continue killing germs even after washing. Please DO NOT use if you have an allergy to CHG or antibacterial soaps.  If your skin becomes reddened/irritated stop using the CHG and inform your nurse when you arrive at  Short Stay. Do not shave (including legs and underarms) for at least 48 hours prior to the first CHG shower.  You may shave your face/neck. Please follow these instructions carefully:  1.  Shower with CHG Soap the night before surgery and the  morning of Surgery.  2.  If you choose to wash your hair, wash your hair first as usual with your  normal  shampoo.  3.  After you shampoo, rinse your hair and body thoroughly to remove the  shampoo.                           4.  Use CHG as you would any other liquid soap.  You can apply chg directly  to the skin and wash                       Gently with a scrungie or clean washcloth.  5.  Apply the CHG Soap to your body ONLY FROM THE NECK DOWN.   Do not use on face/ open                           Wound or open sores. Avoid contact with eyes, ears mouth and genitals (private parts).                       Wash face,  Genitals (private parts) with your normal soap.             6.  Wash thoroughly, paying special attention to the area where your surgery  will be performed.  7.  Thoroughly rinse your body with warm water from the neck down.  8.  DO NOT shower/wash with your normal soap after using and rinsing off  the CHG Soap.                9.  Pat yourself dry with a clean towel.            10.  Wear clean pajamas.            11.  Place clean sheets on your bed the night of your first shower and do not  sleep with pets. Day of Surgery : Do not apply any lotions/deodorants the morning of surgery.  Please wear clean clothes to the hospital/surgery center.  FAILURE TO FOLLOW THESE INSTRUCTIONS MAY RESULT IN THE CANCELLATION OF YOUR SURGERY PATIENT SIGNATURE_________________________________  NURSE SIGNATURE__________________________________  ________________________________________________________________________   Adam Phenix  An incentive spirometer is a tool that can help keep your lungs clear and active. This tool measures how well you are  filling your lungs with each breath. Taking long deep breaths may help reverse or decrease the chance of developing breathing (pulmonary) problems (especially infection) following:  A long period of time when you are unable to move or be active. BEFORE THE PROCEDURE   If the spirometer includes an indicator to show your best effort, your nurse or respiratory therapist will set it to a desired goal.  If possible, sit up straight or lean slightly forward. Try not to slouch.  Hold the incentive spirometer in an upright position. INSTRUCTIONS FOR USE  1. Sit on the edge of your bed if possible, or sit up as far as you can in bed or on a chair. 2. Hold the incentive spirometer in an upright position. 3. Breathe out normally. 4. Place the mouthpiece in your mouth and seal your lips tightly around it. 5. Breathe in slowly and as deeply as possible, raising the piston or the ball toward the top of the column. 6. Hold your breath for 3-5 seconds or for as long as possible. Allow the piston or ball to fall to the bottom of the column. 7. Remove the mouthpiece from your mouth and breathe out normally. 8. Rest for a few seconds and repeat Steps 1 through 7 at least 10 times every 1-2 hours when you are awake. Take your time and take a few normal breaths between deep breaths. 9. The spirometer may include an indicator to show your best effort. Use the indicator as a goal to work toward during each repetition. 10. After each  set of 10 deep breaths, practice coughing to be sure your lungs are clear. If you have an incision (the cut made at the time of surgery), support your incision when coughing by placing a pillow or rolled up towels firmly against it. Once you are able to get out of bed, walk around indoors and cough well. You may stop using the incentive spirometer when instructed by your caregiver.  RISKS AND COMPLICATIONS  Take your time so you do not get dizzy or light-headed.  If you are in pain,  you may need to take or ask for pain medication before doing incentive spirometry. It is harder to take a deep breath if you are having pain. AFTER USE  Rest and breathe slowly and easily.  It can be helpful to keep track of a log of your progress. Your caregiver can provide you with a simple table to help with this. If you are using the spirometer at home, follow these instructions: [place] IF:   You are having difficultly using the spirometer.  You have trouble using the spirometer as often as instructed.  Your pain medication is not giving enough relief while using the spirometer.  You develop fever of 100.5 F (38.1 C) or higher. SEEK IMMEDIATE MEDICAL CARE IF:   You cough up bloody sputum that had not been present before.  You develop fever of 102 F (38.9 C) or greater.  You develop worsening pain at or near the incision site. MAKE SURE YOU:   Understand these instructions.  Will watch your condition.  Will get help right away if you are not doing well or get worse. Document Released: 03/25/2007 Document Revised: 02/04/2012 Document Reviewed: 05/26/2007 ExitCare Patient Information 2014 Exit[employer], [place].   ________________________________________________________________________  WHAT IS A BLOOD TRANSFUSION? Blood Transfusion Information  A transfusion is the replacement of blood or some of its parts. Blood is made up of multiple cells which provide different functions.  Red blood cells carry oxygen and are used for blood loss replacement.  White blood cells fight against infection.  Platelets control bleeding.  Plasma helps clot blood.  Other blood products are available for specialized needs, such as hemophilia or other clotting disorders. BEFORE THE TRANSFUSION  Who gives blood for transfusions?   Healthy volunteers who are fully evaluated to make sure their blood is safe. This is blood bank blood. Transfusion therapy is the safest it has ever  been in the practice of medicine. Before blood is taken from a donor, a complete history is taken to make sure that person has no history of diseases nor engages in risky social behavior (examples are intravenous drug use or sexual activity with multiple partners). The donor's travel history is screened to minimize risk of transmitting infections, such as malaria. The donated blood is tested for signs of infectious diseases, such as HIV and hepatitis. The blood is then tested to be sure it is compatible with you in order to minimize the chance of a transfusion reaction. If you or a relative donates blood, this is often done in anticipation of surgery and is not appropriate for emergency situations. It takes many days to process the donated blood. RISKS AND COMPLICATIONS Although transfusion therapy is very safe and saves many lives, the main dangers of transfusion include:   Getting an infectious disease.  Developing a transfusion reaction. This is an allergic reaction to something in the blood you were given. Every precaution is taken to prevent this. The decision to have  a blood transfusion has been considered carefully by your caregiver before blood is given. Blood is not given unless the benefits outweigh the risks. AFTER THE TRANSFUSION  Right after receiving a blood transfusion, you will usually feel much better and more energetic. This is especially true if your red blood cells have gotten low (anemic). The transfusion raises the level of the red blood cells which carry oxygen, and this usually causes an energy increase.  The nurse administering the transfusion will monitor you carefully for complications. HOME CARE INSTRUCTIONS  No special instructions are needed after a transfusion. You may find your energy is better. Speak with your caregiver about any limitations on activity for underlying diseases you may have. SEEK MEDICAL CARE IF:   Your condition is not improving after your  transfusion.  You develop redness or irritation at the intravenous (IV) site. SEEK IMMEDIATE MEDICAL CARE IF:  Any of the following symptoms occur over the next 12 hours:  Shaking chills.  You have a temperature by mouth above 102 F (38.9 C), not controlled by medicine.  Chest, back, or muscle pain.  People around you feel you are not acting correctly or are confused.  Shortness of breath or difficulty breathing.  Dizziness and fainting.  You get a rash or develop hives.  You have a decrease in urine output.  Your urine turns a dark color or changes to pink, red, or brown. Any of the following symptoms occur over the next 10 days:  You have a temperature by mouth above 102 F (38.9 C), not controlled by medicine.  Shortness of breath.  Weakness after normal activity.  The white part of the eye turns yellow (jaundice).  You have a decrease in the amount of urine or are urinating less often.  Your urine turns a dark color or changes to pink, red, or brown. Document Released: 11/09/2000 Document Revised: 02/04/2012 Document Reviewed: 06/28/2008 [facility] Patient Information 2014 [place], [place].  _______________________________________________________________________

## 2018-07-08 ENCOUNTER — Other Ambulatory Visit: Payer: Self-pay

## 2018-07-08 ENCOUNTER — Encounter (HOSPITAL_COMMUNITY)
Admission: RE | Admit: 2018-07-08 | Discharge: 2018-07-08 | Disposition: A | Discharge status: Home / Self Care | Payer: Medicare HMO | Source: Ambulatory Visit | Attending: Orthopedic Surgery | Admitting: Orthopedic Surgery

## 2018-07-08 ENCOUNTER — Encounter: Payer: Self-pay | Admitting: Skilled Nursing Facility1

## 2018-07-08 ENCOUNTER — Encounter (HOSPITAL_COMMUNITY): Payer: Self-pay

## 2018-07-08 ENCOUNTER — Encounter: Payer: Medicare HMO | Attending: Surgery | Admitting: Skilled Nursing Facility1

## 2018-07-08 DIAGNOSIS — Z01818 Encounter for other preprocedural examination: Secondary | ICD-10-CM | POA: Diagnosis present

## 2018-07-08 DIAGNOSIS — K219 Gastro-esophageal reflux disease without esophagitis: Secondary | ICD-10-CM | POA: Insufficient documentation

## 2018-07-08 DIAGNOSIS — Z9049 Acquired absence of other specified parts of digestive tract: Secondary | ICD-10-CM | POA: Insufficient documentation

## 2018-07-08 DIAGNOSIS — Z841 Family history of disorders of kidney and ureter: Secondary | ICD-10-CM | POA: Diagnosis not present

## 2018-07-08 DIAGNOSIS — Z8379 Family history of other diseases of the digestive system: Secondary | ICD-10-CM | POA: Diagnosis not present

## 2018-07-08 DIAGNOSIS — Z888 Allergy status to other drugs, medicaments and biological substances status: Secondary | ICD-10-CM | POA: Insufficient documentation

## 2018-07-08 DIAGNOSIS — Z833 Family history of diabetes mellitus: Secondary | ICD-10-CM | POA: Insufficient documentation

## 2018-07-08 DIAGNOSIS — G4733 Obstructive sleep apnea (adult) (pediatric): Secondary | ICD-10-CM | POA: Diagnosis not present

## 2018-07-08 DIAGNOSIS — Z79899 Other long term (current) drug therapy: Secondary | ICD-10-CM | POA: Diagnosis not present

## 2018-07-08 DIAGNOSIS — Z8249 Family history of ischemic heart disease and other diseases of the circulatory system: Secondary | ICD-10-CM | POA: Insufficient documentation

## 2018-07-08 DIAGNOSIS — F329 Major depressive disorder, single episode, unspecified: Secondary | ICD-10-CM | POA: Insufficient documentation

## 2018-07-08 DIAGNOSIS — F419 Anxiety disorder, unspecified: Secondary | ICD-10-CM | POA: Insufficient documentation

## 2018-07-08 DIAGNOSIS — Z823 Family history of stroke: Secondary | ICD-10-CM | POA: Insufficient documentation

## 2018-07-08 DIAGNOSIS — Z6841 Body Mass Index (BMI) 40.0 and over, adult: Secondary | ICD-10-CM | POA: Insufficient documentation

## 2018-07-08 DIAGNOSIS — Z825 Family history of asthma and other chronic lower respiratory diseases: Secondary | ICD-10-CM | POA: Insufficient documentation

## 2018-07-08 DIAGNOSIS — G629 Polyneuropathy, unspecified: Secondary | ICD-10-CM | POA: Diagnosis not present

## 2018-07-08 DIAGNOSIS — Z905 Acquired absence of kidney: Secondary | ICD-10-CM | POA: Diagnosis not present

## 2018-07-08 DIAGNOSIS — Z818 Family history of other mental and behavioral disorders: Secondary | ICD-10-CM | POA: Insufficient documentation

## 2018-07-08 DIAGNOSIS — Z713 Dietary counseling and surveillance: Secondary | ICD-10-CM | POA: Insufficient documentation

## 2018-07-08 DIAGNOSIS — E785 Hyperlipidemia, unspecified: Secondary | ICD-10-CM | POA: Diagnosis not present

## 2018-07-08 DIAGNOSIS — M1712 Unilateral primary osteoarthritis, left knee: Secondary | ICD-10-CM | POA: Diagnosis not present

## 2018-07-08 DIAGNOSIS — Z8261 Family history of arthritis: Secondary | ICD-10-CM | POA: Insufficient documentation

## 2018-07-08 DIAGNOSIS — D649 Anemia, unspecified: Secondary | ICD-10-CM | POA: Diagnosis not present

## 2018-07-08 DIAGNOSIS — E119 Type 2 diabetes mellitus without complications: Secondary | ICD-10-CM

## 2018-07-08 DIAGNOSIS — R69 Illness, unspecified: Secondary | ICD-10-CM | POA: Diagnosis not present

## 2018-07-08 DIAGNOSIS — Z7982 Long term (current) use of aspirin: Secondary | ICD-10-CM | POA: Insufficient documentation

## 2018-07-08 LAB — COMPREHENSIVE METABOLIC PANEL
ALT: 21 U/L (ref 0–44)
AST: 20 U/L (ref 15–41)
Albumin: 3.7 g/dL (ref 3.5–5.0)
Alkaline Phosphatase: 117 U/L (ref 38–126)
Anion gap: 9 (ref 5–15)
BUN: 25 mg/dL — ABNORMAL HIGH (ref 8–23)
CO2: 27 mmol/L (ref 22–32)
Calcium: 9.4 mg/dL (ref 8.9–10.3)
Chloride: 107 mmol/L (ref 98–111)
Creatinine, Ser: 0.84 mg/dL (ref 0.44–1.00)
GFR calc Af Amer: >60 mL/min (ref >60)
GFR calc non Af Amer: >60 mL/min (ref >60)
Glucose, Bld: 96 mg/dL (ref 70–99)
Potassium: 5.1 mmol/L (ref 3.5–5.1)
Sodium: 143 mmol/L (ref 135–145)
Total Bilirubin: 0.4 mg/dL (ref 0.3–1.2)
Total Protein: 7.4 g/dL (ref 6.5–8.1)

## 2018-07-08 LAB — PROTIME-INR
INR: 0.94
Prothrombin Time: 12.5 seconds (ref 11.4–15.2)

## 2018-07-08 LAB — URINALYSIS, ROUTINE W REFLEX MICROSCOPIC
Bilirubin Urine: NEGATIVE
Glucose, UA: NEGATIVE mg/dL
Hgb urine dipstick: NEGATIVE
Ketones, ur: NEGATIVE mg/dL
Nitrite: NEGATIVE
Protein, ur: NEGATIVE mg/dL
Specific Gravity, Urine: 1.025 (ref 1.005–1.030)
pH: 7 (ref 5.0–8.0)

## 2018-07-08 LAB — SURGICAL PCR SCREEN
MRSA, PCR: NEGATIVE
Staphylococcus aureus: POSITIVE — AB

## 2018-07-08 LAB — CBC
HCT: 39 % (ref 36.0–46.0)
Hemoglobin: 12.2 g/dL (ref 12.0–15.0)
MCH: 29 pg (ref 26.0–34.0)
MCHC: 31.3 g/dL (ref 30.0–36.0)
MCV: 92.9 fL (ref 78.0–100.0)
Platelets: 388 10*3/uL (ref 150–400)
RBC: 4.2 MIL/uL (ref 3.87–5.11)
RDW: 15.2 % (ref 11.5–15.5)
WBC: 9.9 10*3/uL (ref 4.0–10.5)

## 2018-07-08 LAB — APTT: aPTT: 32 seconds (ref 24–36)

## 2018-07-08 NOTE — Progress Notes (Signed)
RYGB Assessment: 2nd SWL Appointment.  Pt needs 6 more visits.  Pt has gastroparesis.  Pt states she is getting knee surgery August 19th.  Pt states her husband is not doing well so she is very stressed. Pt states she wanted to use food to cope with the stress stating she did half bad half good: drank a mountain dew but did not eat candy. Pt state she has been drinking alkaline water and thinks maybe it helps. Pt states she is concerned with having more nausea after surgery. Pt states she does not like vegetables.   Start Wt at NDES: 225.8 Wt: 219.6 BMI: 40.52   MEDICATIONS: See List   DIETARY INTAKE:  24-hr recall:  B ( AM): rice crispie cereal with banana or yogurt Snk ( AM): 1/2 cookie or fruit L ( PM): air fried chicken with french fries or bbq baked beans Snk ( PM): banana pudding and chocolate pie  D ( PM): grilled chicken and potatoes or bbq and banana pudding  Snk ( PM):  Beverages: water, g[place], diet mountain dew  Usual physical activity: ADL's  Diet to Follow: 1500 calories 170 g carbohydrates 112 g protein 42 g fat   Nutritional Diagnosis:  [place]-3.3 Overweight/obesity related to past poor dietary habits and physical inactivity as evidenced by patient w/ planned RYGB surgery following dietary guidelines for continued weight loss.    Intervention:  Nutrition counseling for upcoming Bariatric Surgery. Goals: -Encouraged to engage in 150 minutes of moderate physical activity including cardiovascular and weight baring weekly -Keep a food journal -Keep working on chewing until applesauce consistency -Aim to get in 64 fluid ounces a day -Keep working on not drinking mountain dew  -Eat until satisfaction  -Try not to drink with meals -Work on trying new vegetables  Given the snack sheet  Teaching Method Utilized:  Visual Auditory Hands on  Barriers to learning/adherence to lifestyle change: none identified   Demonstrated degree of understanding via:  Teach  Back   Monitoring/Evaluation:  Dietary intake, exercise,and body weight prn.

## 2018-07-09 DIAGNOSIS — G609 Hereditary and idiopathic neuropathy, unspecified: Secondary | ICD-10-CM | POA: Diagnosis not present

## 2018-07-11 ENCOUNTER — Other Ambulatory Visit: Payer: Self-pay | Admitting: Orthopedic Surgery

## 2018-07-11 NOTE — Care Plan (Signed)
L TKA scheduled on 07-14-18 DCP:  Home with spouse DME:  No needs.  Has a RW and rx for 3-in-1 given at [facility]P PT:  [facility].  PT eval scheduled on 07-18-18.

## 2018-07-13 MED ORDER — BUPIVACAINE LIPOSOME 1.3 % IJ SUSP
20.0000 mL | INTRAMUSCULAR | Status: DC
  Filled 2018-07-13: qty 20

## 2018-07-14 ENCOUNTER — Inpatient Hospital Stay (HOSPITAL_COMMUNITY)
Admission: RE | Admit: 2018-07-14 | Discharge: 2018-07-16 | DRG: 470 | Disposition: A | Discharge status: Home / Self Care | Payer: Medicare HMO | Attending: Orthopedic Surgery | Admitting: Orthopedic Surgery

## 2018-07-14 ENCOUNTER — Inpatient Hospital Stay (HOSPITAL_COMMUNITY): Payer: Medicare HMO | Admitting: Registered Nurse

## 2018-07-14 ENCOUNTER — Other Ambulatory Visit: Payer: Self-pay

## 2018-07-14 ENCOUNTER — Encounter (HOSPITAL_COMMUNITY): Payer: Self-pay | Admitting: *Deleted

## 2018-07-14 ENCOUNTER — Encounter (HOSPITAL_COMMUNITY)
Admission: RE | Disposition: A | Discharge status: Home / Self Care | Payer: Self-pay | Source: Home / Self Care | Attending: Orthopedic Surgery

## 2018-07-14 DIAGNOSIS — K219 Gastro-esophageal reflux disease without esophagitis: Secondary | ICD-10-CM | POA: Diagnosis present

## 2018-07-14 DIAGNOSIS — G4733 Obstructive sleep apnea (adult) (pediatric): Secondary | ICD-10-CM | POA: Diagnosis present

## 2018-07-14 DIAGNOSIS — G2581 Restless legs syndrome: Secondary | ICD-10-CM | POA: Diagnosis present

## 2018-07-14 DIAGNOSIS — M797 Fibromyalgia: Secondary | ICD-10-CM | POA: Diagnosis not present

## 2018-07-14 DIAGNOSIS — G8918 Other acute postprocedural pain: Secondary | ICD-10-CM | POA: Diagnosis not present

## 2018-07-14 DIAGNOSIS — M1710 Unilateral primary osteoarthritis, unspecified knee: Secondary | ICD-10-CM

## 2018-07-14 DIAGNOSIS — D649 Anemia, unspecified: Secondary | ICD-10-CM | POA: Diagnosis present

## 2018-07-14 DIAGNOSIS — Z6841 Body Mass Index (BMI) 40.0 and over, adult: Secondary | ICD-10-CM

## 2018-07-14 DIAGNOSIS — E669 Obesity, unspecified: Secondary | ICD-10-CM | POA: Diagnosis present

## 2018-07-14 DIAGNOSIS — M179 Osteoarthritis of knee, unspecified: Secondary | ICD-10-CM

## 2018-07-14 DIAGNOSIS — E785 Hyperlipidemia, unspecified: Secondary | ICD-10-CM | POA: Diagnosis not present

## 2018-07-14 DIAGNOSIS — K3184 Gastroparesis: Secondary | ICD-10-CM | POA: Diagnosis present

## 2018-07-14 DIAGNOSIS — Z79899 Other long term (current) drug therapy: Secondary | ICD-10-CM

## 2018-07-14 DIAGNOSIS — Z7951 Long term (current) use of inhaled steroids: Secondary | ICD-10-CM

## 2018-07-14 DIAGNOSIS — Z96651 Presence of right artificial knee joint: Secondary | ICD-10-CM | POA: Diagnosis present

## 2018-07-14 DIAGNOSIS — M1712 Unilateral primary osteoarthritis, left knee: Secondary | ICD-10-CM | POA: Diagnosis not present

## 2018-07-14 HISTORY — PX: TOTAL KNEE ARTHROPLASTY: SHX125

## 2018-07-14 LAB — TYPE AND SCREEN
ABO/RH(D): O POS
Antibody Screen: NEGATIVE

## 2018-07-14 SURGERY — ARTHROPLASTY, KNEE, TOTAL
Anesthesia: Spinal | Site: Knee | Laterality: Left

## 2018-07-14 MED ORDER — SODIUM CHLORIDE 0.9 % IR SOLN
Status: DC | PRN
  Administered 2018-07-14: 1000 mL

## 2018-07-14 MED ORDER — METHOCARBAMOL 500 MG IVPB - SIMPLE MED
500.0000 mg | Freq: Four times a day (QID) | INTRAVENOUS | Status: DC | PRN
  Filled 2018-07-14: qty 50

## 2018-07-14 MED ORDER — OXYCODONE HCL 5 MG PO TABS
5.0000 mg | ORAL_TABLET | ORAL | Status: DC | PRN
  Administered 2018-07-14 (×2): 5 mg via ORAL
  Administered 2018-07-15: 10 mg via ORAL
  Administered 2018-07-15 (×3): 5 mg via ORAL
  Administered 2018-07-16: 10 mg via ORAL
  Administered 2018-07-16: 5 mg via ORAL
  Filled 2018-07-14: qty 1
  Filled 2018-07-14: qty 2
  Filled 2018-07-14: qty 1
  Filled 2018-07-14: qty 2
  Filled 2018-07-14 (×2): qty 1
  Filled 2018-07-14: qty 2
  Filled 2018-07-14 (×2): qty 1

## 2018-07-14 MED ORDER — PANTOPRAZOLE SODIUM 40 MG PO TBEC
40.0000 mg | DELAYED_RELEASE_TABLET | Freq: Two times a day (BID) | ORAL | Status: DC
  Administered 2018-07-14 – 2018-07-16 (×4): 40 mg via ORAL
  Filled 2018-07-14 (×4): qty 1

## 2018-07-14 MED ORDER — ATORVASTATIN CALCIUM 40 MG PO TABS
40.0000 mg | ORAL_TABLET | Freq: Every day | ORAL | Status: DC
  Administered 2018-07-15 – 2018-07-16 (×2): 40 mg via ORAL
  Filled 2018-07-14 (×2): qty 1

## 2018-07-14 MED ORDER — ALPRAZOLAM 0.25 MG PO TABS
0.2500 mg | ORAL_TABLET | Freq: Three times a day (TID) | ORAL | Status: DC | PRN
  Administered 2018-07-14 – 2018-07-16 (×2): 0.25 mg via ORAL
  Filled 2018-07-14 (×2): qty 1

## 2018-07-14 MED ORDER — STERILE WATER FOR IRRIGATION IR SOLN
Status: DC | PRN
  Administered 2018-07-14: 2000 mL

## 2018-07-14 MED ORDER — ONDANSETRON HCL 4 MG/2ML IJ SOLN
INTRAMUSCULAR | Status: AC
  Filled 2018-07-14: qty 2

## 2018-07-14 MED ORDER — GABAPENTIN 300 MG PO CAPS
300.0000 mg | ORAL_CAPSULE | Freq: Three times a day (TID) | ORAL | Status: DC
  Administered 2018-07-14 – 2018-07-16 (×6): 300 mg via ORAL
  Filled 2018-07-14 (×6): qty 1

## 2018-07-14 MED ORDER — DEXAMETHASONE SODIUM PHOSPHATE 10 MG/ML IJ SOLN
8.0000 mg | Freq: Once | INTRAMUSCULAR | Status: AC
  Administered 2018-07-14: 10 mg via INTRAVENOUS

## 2018-07-14 MED ORDER — COLCHICINE 0.6 MG PO TABS: 0.6000 mg | ORAL_TABLET | Freq: Every day | ORAL | Status: DC | PRN

## 2018-07-14 MED ORDER — OXYCODONE HCL 5 MG PO TABS
10.0000 mg | ORAL_TABLET | ORAL | Status: DC | PRN
  Administered 2018-07-14 – 2018-07-16 (×3): 10 mg via ORAL
  Filled 2018-07-14 (×2): qty 2

## 2018-07-14 MED ORDER — TRANEXAMIC ACID 1000 MG/10ML IV SOLN
1000.0000 mg | Freq: Once | INTRAVENOUS | Status: AC
  Administered 2018-07-14: 1000 mg via INTRAVENOUS
  Filled 2018-07-14: qty 1000

## 2018-07-14 MED ORDER — FENTANYL CITRATE (PF) 100 MCG/2ML IJ SOLN
INTRAMUSCULAR | Status: AC
  Filled 2018-07-14: qty 2

## 2018-07-14 MED ORDER — FENTANYL CITRATE (PF) 100 MCG/2ML IJ SOLN: 25.0000 ug | INTRAMUSCULAR | Status: DC | PRN

## 2018-07-14 MED ORDER — PROPOFOL 500 MG/50ML IV EMUL
INTRAVENOUS | Status: DC | PRN
  Administered 2018-07-14: 100 ug/kg/min via INTRAVENOUS

## 2018-07-14 MED ORDER — FLEET ENEMA 7-19 GM/118ML RE ENEM
1.0000 | ENEMA | Freq: Once | RECTAL | Status: DC | PRN
Start: 2018-07-14 — End: 2018-07-16

## 2018-07-14 MED ORDER — BUPIVACAINE LIPOSOME 1.3 % IJ SUSP
INTRAMUSCULAR | Status: DC | PRN
  Administered 2018-07-14: 20 mL

## 2018-07-14 MED ORDER — SCOPOLAMINE 1 MG/3DAYS TD PT72
MEDICATED_PATCH | TRANSDERMAL | Status: DC | PRN
  Administered 2018-07-14: 1 via TRANSDERMAL

## 2018-07-14 MED ORDER — SCOPOLAMINE 1 MG/3DAYS TD PT72
1.0000 | MEDICATED_PATCH | TRANSDERMAL | Status: DC
  Administered 2018-07-14: 1.5 mg via TRANSDERMAL

## 2018-07-14 MED ORDER — METHOCARBAMOL 500 MG PO TABS
500.0000 mg | ORAL_TABLET | Freq: Four times a day (QID) | ORAL | Status: DC | PRN
  Administered 2018-07-14 – 2018-07-16 (×3): 500 mg via ORAL
  Filled 2018-07-14 (×4): qty 1

## 2018-07-14 MED ORDER — MORPHINE SULFATE (PF) 2 MG/ML IV SOLN
1.0000 mg | INTRAVENOUS | Status: DC | PRN
  Administered 2018-07-15: 2 mg via INTRAVENOUS
  Filled 2018-07-14: qty 1

## 2018-07-14 MED ORDER — PROCHLORPERAZINE MALEATE 10 MG PO TABS
10.0000 mg | ORAL_TABLET | Freq: Four times a day (QID) | ORAL | Status: DC | PRN
  Filled 2018-07-14: qty 1

## 2018-07-14 MED ORDER — ARIPIPRAZOLE 5 MG PO TABS
5.0000 mg | ORAL_TABLET | Freq: Every day | ORAL | Status: DC
  Administered 2018-07-15 – 2018-07-16 (×2): 5 mg via ORAL
  Filled 2018-07-14 (×2): qty 1

## 2018-07-14 MED ORDER — ASPIRIN EC 325 MG PO TBEC
325.0000 mg | DELAYED_RELEASE_TABLET | Freq: Two times a day (BID) | ORAL | Status: DC
  Administered 2018-07-15 – 2018-07-16 (×3): 325 mg via ORAL
  Filled 2018-07-14 (×3): qty 1

## 2018-07-14 MED ORDER — SODIUM CHLORIDE 0.9 % IJ SOLN
INTRAMUSCULAR | Status: AC
  Filled 2018-07-14: qty 50

## 2018-07-14 MED ORDER — MIDAZOLAM HCL 2 MG/2ML IJ SOLN
INTRAMUSCULAR | Status: AC
  Filled 2018-07-14: qty 2

## 2018-07-14 MED ORDER — PROPOFOL 10 MG/ML IV BOLUS
INTRAVENOUS | Status: AC
  Filled 2018-07-14: qty 60

## 2018-07-14 MED ORDER — MEPERIDINE HCL 50 MG/ML IJ SOLN: 6.2500 mg | INTRAMUSCULAR | Status: DC | PRN

## 2018-07-14 MED ORDER — FENTANYL CITRATE (PF) 100 MCG/2ML IJ SOLN
50.0000 ug | Freq: Once | INTRAMUSCULAR | Status: AC | PRN
  Administered 2018-07-14: 100 ug via INTRAVENOUS

## 2018-07-14 MED ORDER — ONDANSETRON HCL 4 MG/2ML IJ SOLN
4.0000 mg | Freq: Four times a day (QID) | INTRAMUSCULAR | Status: DC | PRN
  Administered 2018-07-16: 4 mg via INTRAVENOUS
  Filled 2018-07-14: qty 2

## 2018-07-14 MED ORDER — CHLORHEXIDINE GLUCONATE 4 % EX LIQD: 60.0000 mL | Freq: Once | CUTANEOUS | Status: DC

## 2018-07-14 MED ORDER — FAMOTIDINE 20 MG PO TABS
20.0000 mg | ORAL_TABLET | Freq: Two times a day (BID) | ORAL | Status: DC
  Administered 2018-07-14 – 2018-07-16 (×4): 20 mg via ORAL
  Filled 2018-07-14 (×5): qty 1

## 2018-07-14 MED ORDER — ALBUTEROL SULFATE (2.5 MG/3ML) 0.083% IN NEBU: 3.0000 mL | INHALATION_SOLUTION | Freq: Four times a day (QID) | RESPIRATORY_TRACT | Status: DC | PRN

## 2018-07-14 MED ORDER — POLYETHYLENE GLYCOL 3350 17 G PO PACK: 17.0000 g | PACK | Freq: Every day | ORAL | Status: DC | PRN

## 2018-07-14 MED ORDER — GABAPENTIN 300 MG PO CAPS
300.0000 mg | ORAL_CAPSULE | Freq: Once | ORAL | Status: AC
  Administered 2018-07-14: 300 mg via ORAL
  Filled 2018-07-14: qty 1

## 2018-07-14 MED ORDER — SCOPOLAMINE 1 MG/3DAYS TD PT72
MEDICATED_PATCH | TRANSDERMAL | Status: AC
  Filled 2018-07-14: qty 1

## 2018-07-14 MED ORDER — ACETAMINOPHEN 500 MG PO TABS
1000.0000 mg | ORAL_TABLET | Freq: Four times a day (QID) | ORAL | Status: AC
  Administered 2018-07-14 – 2018-07-15 (×4): 1000 mg via ORAL
  Filled 2018-07-14 (×4): qty 2

## 2018-07-14 MED ORDER — PROMETHAZINE HCL 25 MG RE SUPP
25.0000 mg | Freq: Four times a day (QID) | RECTAL | Status: DC | PRN
  Filled 2018-07-14: qty 1

## 2018-07-14 MED ORDER — SODIUM CHLORIDE 0.9 % IV SOLN
1000.0000 mg | INTRAVENOUS | Status: AC
  Administered 2018-07-14: 1000 mg via INTRAVENOUS
  Filled 2018-07-14: qty 10

## 2018-07-14 MED ORDER — SODIUM CHLORIDE 0.9 % IJ SOLN
INTRAMUSCULAR | Status: AC
  Filled 2018-07-14: qty 10

## 2018-07-14 MED ORDER — NORTRIPTYLINE HCL 25 MG PO CAPS
75.0000 mg | ORAL_CAPSULE | Freq: Every day | ORAL | Status: DC
  Administered 2018-07-14 – 2018-07-15 (×2): 75 mg via ORAL
  Filled 2018-07-14 (×2): qty 3

## 2018-07-14 MED ORDER — ONDANSETRON HCL 4 MG/2ML IJ SOLN
INTRAMUSCULAR | Status: DC | PRN
  Administered 2018-07-14: 4 mg via INTRAVENOUS

## 2018-07-14 MED ORDER — ONDANSETRON HCL 4 MG PO TABS: 8.0000 mg | ORAL_TABLET | Freq: Three times a day (TID) | ORAL | Status: DC | PRN

## 2018-07-14 MED ORDER — PHENYLEPHRINE HCL 10 MG/ML IJ SOLN
INTRAMUSCULAR | Status: AC
  Filled 2018-07-14: qty 1

## 2018-07-14 MED ORDER — SODIUM CHLORIDE 0.9 % IV SOLN
INTRAVENOUS | Status: DC | PRN
  Administered 2018-07-14: 25 ug/min via INTRAVENOUS

## 2018-07-14 MED ORDER — SODIUM CHLORIDE 0.9 % IV SOLN
INTRAVENOUS | Status: DC
  Administered 2018-07-14: 14:00:00 via INTRAVENOUS

## 2018-07-14 MED ORDER — DEXAMETHASONE SODIUM PHOSPHATE 10 MG/ML IJ SOLN
INTRAMUSCULAR | Status: AC
  Filled 2018-07-14: qty 1

## 2018-07-14 MED ORDER — PHENOL 1.4 % MT LIQD
1.0000 | OROMUCOSAL | Status: DC | PRN
  Filled 2018-07-14: qty 177

## 2018-07-14 MED ORDER — ACETAMINOPHEN 10 MG/ML IV SOLN
1000.0000 mg | Freq: Four times a day (QID) | INTRAVENOUS | Status: DC
  Administered 2018-07-14: 1000 mg via INTRAVENOUS
  Filled 2018-07-14: qty 100

## 2018-07-14 MED ORDER — DULOXETINE HCL 60 MG PO CPEP
60.0000 mg | ORAL_CAPSULE | Freq: Two times a day (BID) | ORAL | Status: DC
  Administered 2018-07-14 – 2018-07-16 (×4): 60 mg via ORAL
  Filled 2018-07-14 (×4): qty 1

## 2018-07-14 MED ORDER — LORATADINE 10 MG PO TABS
10.0000 mg | ORAL_TABLET | Freq: Every day | ORAL | Status: DC
  Administered 2018-07-14 – 2018-07-15 (×2): 10 mg via ORAL
  Filled 2018-07-14 (×2): qty 1

## 2018-07-14 MED ORDER — LACTATED RINGERS IV SOLN
INTRAVENOUS | Status: DC
  Administered 2018-07-14 (×3): via INTRAVENOUS

## 2018-07-14 MED ORDER — SODIUM CHLORIDE 0.9 % IJ SOLN
INTRAMUSCULAR | Status: DC | PRN
  Administered 2018-07-14: 60 mL

## 2018-07-14 MED ORDER — FLUTICASONE PROPIONATE 50 MCG/ACT NA SUSP
2.0000 | Freq: Every evening | NASAL | Status: DC
  Administered 2018-07-14 – 2018-07-15 (×2): 2 via NASAL
  Filled 2018-07-14: qty 16

## 2018-07-14 MED ORDER — BUPIVACAINE IN DEXTROSE 0.75-8.25 % IT SOLN
INTRATHECAL | Status: DC | PRN
  Administered 2018-07-14: 1.8 mL via INTRATHECAL

## 2018-07-14 MED ORDER — FENTANYL CITRATE (PF) 100 MCG/2ML IJ SOLN
INTRAMUSCULAR | Status: AC
  Administered 2018-07-14: 100 ug via INTRAVENOUS
  Filled 2018-07-14: qty 2

## 2018-07-14 MED ORDER — METOCLOPRAMIDE HCL 5 MG/ML IJ SOLN: 5.0000 mg | Freq: Three times a day (TID) | INTRAMUSCULAR | Status: DC | PRN

## 2018-07-14 MED ORDER — MIDAZOLAM HCL 2 MG/2ML IJ SOLN
INTRAMUSCULAR | Status: AC
  Administered 2018-07-14: 2 mg via INTRAVENOUS
  Filled 2018-07-14: qty 2

## 2018-07-14 MED ORDER — ONDANSETRON HCL 4 MG/2ML IJ SOLN: 4.0000 mg | Freq: Once | INTRAMUSCULAR | Status: DC | PRN

## 2018-07-14 MED ORDER — CEFAZOLIN SODIUM-DEXTROSE 2-4 GM/100ML-% IV SOLN
2.0000 g | INTRAVENOUS | Status: AC
  Administered 2018-07-14: 2 g via INTRAVENOUS
  Filled 2018-07-14: qty 100

## 2018-07-14 MED ORDER — ONDANSETRON HCL 4 MG PO TABS: 4.0000 mg | ORAL_TABLET | Freq: Four times a day (QID) | ORAL | Status: DC | PRN

## 2018-07-14 MED ORDER — METOPROLOL TARTRATE 50 MG PO TABS
50.0000 mg | ORAL_TABLET | Freq: Two times a day (BID) | ORAL | Status: DC
  Administered 2018-07-14 – 2018-07-16 (×3): 50 mg via ORAL
  Filled 2018-07-14 (×3): qty 1

## 2018-07-14 MED ORDER — DOCUSATE SODIUM 100 MG PO CAPS
100.0000 mg | ORAL_CAPSULE | Freq: Two times a day (BID) | ORAL | Status: DC
  Administered 2018-07-14 – 2018-07-16 (×4): 100 mg via ORAL
  Filled 2018-07-14 (×4): qty 1

## 2018-07-14 MED ORDER — MIDAZOLAM HCL 2 MG/2ML IJ SOLN
1.0000 mg | Freq: Once | INTRAMUSCULAR | Status: AC | PRN
  Administered 2018-07-14: 2 mg via INTRAVENOUS

## 2018-07-14 MED ORDER — CEFAZOLIN SODIUM-DEXTROSE 2-4 GM/100ML-% IV SOLN
2.0000 g | Freq: Four times a day (QID) | INTRAVENOUS | Status: AC
  Administered 2018-07-14 (×2): 2 g via INTRAVENOUS
  Filled 2018-07-14 (×2): qty 100

## 2018-07-14 MED ORDER — DEXAMETHASONE SODIUM PHOSPHATE 10 MG/ML IJ SOLN
10.0000 mg | Freq: Once | INTRAMUSCULAR | Status: AC
  Administered 2018-07-15: 10 mg via INTRAVENOUS
  Filled 2018-07-14: qty 1

## 2018-07-14 MED ORDER — MENTHOL 3 MG MT LOZG: 1.0000 | LOZENGE | OROMUCOSAL | Status: DC | PRN

## 2018-07-14 MED ORDER — BISACODYL 10 MG RE SUPP: 10.0000 mg | Freq: Every day | RECTAL | Status: DC | PRN

## 2018-07-14 MED ORDER — TOPIRAMATE 25 MG PO TABS
50.0000 mg | ORAL_TABLET | Freq: Every day | ORAL | Status: DC
  Administered 2018-07-14 – 2018-07-15 (×2): 50 mg via ORAL
  Filled 2018-07-14 (×2): qty 2

## 2018-07-14 MED ORDER — ALLOPURINOL 300 MG PO TABS
300.0000 mg | ORAL_TABLET | Freq: Every day | ORAL | Status: DC
  Administered 2018-07-14 – 2018-07-16 (×3): 300 mg via ORAL
  Filled 2018-07-14 (×3): qty 1

## 2018-07-14 MED ORDER — DIPHENHYDRAMINE HCL 12.5 MG/5ML PO ELIX: 12.5000 mg | ORAL_SOLUTION | ORAL | Status: DC | PRN

## 2018-07-14 MED ORDER — METOCLOPRAMIDE HCL 5 MG PO TABS: 5.0000 mg | ORAL_TABLET | Freq: Three times a day (TID) | ORAL | Status: DC | PRN

## 2018-07-14 SURGICAL SUPPLY — 54 items
AUGMENT PFC SIG RP SZ2.5 12.5M (Knees) IMPLANT
BAG SPEC THK2 15X12 ZIP CLS (MISCELLANEOUS) ×1
BAG ZIPLOCK 12X15 (MISCELLANEOUS) ×2 IMPLANT
BANDAGE ACE 6X5 VEL STRL LF (GAUZE/BANDAGES/DRESSINGS) ×2 IMPLANT
BLADE SAG 18X100X1.27 (BLADE) ×2 IMPLANT
BLADE SAW SGTL 11.0X1.19X90.0M (BLADE) ×2 IMPLANT
BOWL SMART MIX CTS (DISPOSABLE) ×2 IMPLANT
CEMENT HV SMART SET (Cement) ×4 IMPLANT
CEMENT TIBIA MBT SIZE 2.5 (Knees) IMPLANT
COVER SURGICAL LIGHT HANDLE (MISCELLANEOUS) ×2 IMPLANT
CUFF TOURN SGL QUICK 34 (TOURNIQUET CUFF) ×2
CUFF TRNQT CYL 34X4X40X1 (TOURNIQUET CUFF) ×1 IMPLANT
DECANTER SPIKE VIAL GLASS SM (MISCELLANEOUS) ×2 IMPLANT
DRAPE U-SHAPE 47X51 STRL (DRAPES) ×2 IMPLANT
DRSG ADAPTIC 3X8 NADH LF (GAUZE/BANDAGES/DRESSINGS) ×2 IMPLANT
DRSG PAD ABDOMINAL 8X10 ST (GAUZE/BANDAGES/DRESSINGS) ×2 IMPLANT
DURAPREP 26ML APPLICATOR (WOUND CARE) ×2 IMPLANT
ELECT REM PT RETURN 15FT ADLT (MISCELLANEOUS) ×2 IMPLANT
EVACUATOR 1/8 PVC DRAIN (DRAIN) ×2 IMPLANT
FEMUR SIGMA PS SZ 2.5 L (Knees) ×1 IMPLANT
GAUZE SPONGE 4X4 12PLY STRL (GAUZE/BANDAGES/DRESSINGS) ×2 IMPLANT
GLOVE BIO SURGEON STRL SZ7 (GLOVE) ×2 IMPLANT
GLOVE BIO SURGEON STRL SZ8 (GLOVE) ×4 IMPLANT
GLOVE BIOGEL PI IND STRL 6.5 (GLOVE) ×1 IMPLANT
GLOVE BIOGEL PI IND STRL 7.0 (GLOVE) ×2 IMPLANT
GLOVE BIOGEL PI IND STRL 8 (GLOVE) ×1 IMPLANT
GLOVE BIOGEL PI INDICATOR 6.5 (GLOVE) ×1
GLOVE BIOGEL PI INDICATOR 7.0 (GLOVE) ×6
GLOVE BIOGEL PI INDICATOR 8 (GLOVE) ×1
GLOVE SURG SS PI 6.5 STRL IVOR (GLOVE) ×2 IMPLANT
GOWN STRL REUS W/TWL LRG LVL3 (GOWN DISPOSABLE) ×4 IMPLANT
HANDPIECE INTERPULSE COAX TIP (DISPOSABLE) ×2
HOLDER FOLEY CATH W/STRAP (MISCELLANEOUS) IMPLANT
IMMOBILIZER KNEE 20 (SOFTGOODS) ×2
IMMOBILIZER KNEE 20 THIGH 36 (SOFTGOODS) ×1 IMPLANT
MANIFOLD NEPTUNE II (INSTRUMENTS) ×2 IMPLANT
NS IRRIG 1000ML POUR BTL (IV SOLUTION) ×2 IMPLANT
PACK TOTAL KNEE CUSTOM (KITS) ×2 IMPLANT
PADDING CAST COTTON 6X4 STRL (CAST SUPPLIES) ×4 IMPLANT
PATELLA DOME PFC 35MM (Knees) ×1 IMPLANT
PFC SIGMA RP STB SZ 2.5 12.5M (Knees) ×2 IMPLANT
PIN STEINMAN FIXATION KNEE (PIN) ×1 IMPLANT
POSITIONER SURGICAL ARM (MISCELLANEOUS) ×2 IMPLANT
SET HNDPC FAN SPRY TIP SCT (DISPOSABLE) ×1 IMPLANT
STRIP CLOSURE SKIN 1/2X4 (GAUZE/BANDAGES/DRESSINGS) ×3 IMPLANT
SUT MNCRL AB 4-0 PS2 18 (SUTURE) ×2 IMPLANT
SUT STRATAFIX 0 PDS 27 VIOLET (SUTURE) ×2
SUT VIC AB 2-0 CT1 27 (SUTURE) ×8
SUT VIC AB 2-0 CT1 TAPERPNT 27 (SUTURE) ×3 IMPLANT
SUTURE STRATFX 0 PDS 27 VIOLET (SUTURE) ×1 IMPLANT
TIBIA MBT CEMENT SIZE 2.5 (Knees) ×2 IMPLANT
TRAY FOLEY CATH 14FR (SET/KITS/TRAYS/PACK) ×1 IMPLANT
WRAP KNEE MAXI GEL POST OP (GAUZE/BANDAGES/DRESSINGS) ×2 IMPLANT
YANKAUER SUCT BULB TIP 10FT TU (MISCELLANEOUS) ×2 IMPLANT

## 2018-07-14 NOTE — Plan of Care (Signed)
Plan of care 

## 2018-07-14 NOTE — Anesthesia Procedure Notes (Deleted)
Performed by: Anamarie Hunn E, CRNA       

## 2018-07-14 NOTE — Progress Notes (Signed)
AssistedDr. Foster with left, ultrasound guided, adductor canal block. Side rails up, monitors on throughout procedure. See vital signs in flow sheet. Tolerated Procedure well.  

## 2018-07-14 NOTE — Op Note (Signed)
OPERATIVE REPORT-TOTAL KNEE ARTHROPLASTY   Pre-operative diagnosis- Osteoarthritis  Left knee(s)  Post-operative diagnosis- Osteoarthritis Left knee(s)  Procedure-  Left  Total Knee Arthroplasty  Surgeon- Dione Plover. Leul Narramore, MD  Assistant- Ardeen Jourdain, PA-C   Anesthesia-  Adductor canal block and spinal  EBL-50 mL   Drains Hemovac  Tourniquet time- 33 minutes @ 462 mm Hg  Complications- None  Condition-PACU - hemodynamically stable.   Brief Clinical Note  [name] is a 62 y.o. year old female with end stage OA of her left knee with progressively worsening pain and dysfunction. She has constant pain, with activity and at rest and significant functional deficits with difficulties even with ADLs. She has had extensive non-op management including analgesics, injections of cortisone and viscosupplements, and home exercise program, but remains in significant pain with significant dysfunction. Radiographs show bone on bone arthritis patellofemoral. She presents now for left Total Knee Arthroplasty.    Procedure in detail---   The patient is brought into the operating room and positioned supine on the operating table. After successful administration of  Adductor canal block and spinal,   a tourniquet is placed high on the  Left thigh(s) and the lower extremity is prepped and draped in the usual sterile fashion. Time out is performed by the operating team and then the  Left lower extremity is wrapped in Esmarch, knee flexed and the tourniquet inflated to 300 mmHg.       A midline incision is made with a ten blade through the subcutaneous tissue to the level of the extensor mechanism. A fresh blade is used to make a medial parapatellar arthrotomy. Soft tissue over the proximal medial tibia is subperiosteally elevated to the joint line with a knife and into the semimembranosus bursa with a Cobb elevator. Soft tissue over the proximal lateral tibia is elevated with attention being paid  to avoiding the patellar tendon on the tibial tubercle. The patella is everted, knee flexed 90 degrees and the ACL and PCL are removed. Findings are bone on bone medial and patellofemoral with large patellar osteophytes.        The drill is used to create a starting hole in the distal femur and the canal is thoroughly irrigated with sterile saline to remove the fatty contents. The 5 degree Left  valgus alignment guide is placed into the femoral canal and the distal femoral cutting block is pinned to remove 10 mm off the distal femur. Resection is made with an oscillating saw.      The tibia is subluxed forward and the menisci are removed. The extramedullary alignment guide is placed referencing proximally at the medial aspect of the tibial tubercle and distally along the second metatarsal axis and tibial crest. The block is pinned to remove 50mm off the more deficient medial  side. Resection is made with an oscillating saw. Size 2.5is the most appropriate size for the tibia and the proximal tibia is prepared with the modular drill and keel punch for that size.      The femoral sizing guide is placed and size 2.5 is most appropriate. Rotation is marked off the epicondylar axis and confirmed by creating a rectangular flexion gap at 90 degrees. The size 2.5 cutting block is pinned in this rotation and the anterior, posterior and chamfer cuts are made with the oscillating saw. The intercondylar block is then placed and that cut is made.      Trial size 2.5 tibial component, trial size 2.5 posterior stabilized femur  and a 10  mm posterior stabilized rotating platform insert trial is placed. Full extension is achieved with excellent varus/valgus and anterior/posterior balance throughout full range of motion. The patella is everted and thickness measured to be 22  mm. Free hand resection is taken to 12 mm, a 35 template is placed, lug holes are drilled, trial patella is placed, and it tracks normally. Osteophytes are  removed off the posterior femur with the trial in place. All trials are removed and the cut bone surfaces prepared with pulsatile lavage. Cement is mixed and once ready for implantation, the size 2.5 tibial implant, size  2.5 posterior stabilized femoral component, and the size 35 patella are cemented in place and the patella is held with the clamp. The trial insert is placed and the knee held in full extension. The Exparel (20 ml mixed with 60 ml saline) is injected into the extensor mechanism, posterior capsule, medial and lateral gutters and subcutaneous tissues.  All extruded cement is removed and once the cement is hard the permanent 10 mm posterior stabilized rotating platform insert is placed into the tibial tray.      The wound is copiously irrigated with saline solution and the extensor mechanism closed over a hemovac drain with #1 V-loc suture. The tourniquet is released for a total tourniquet time of 33  minutes. Flexion against gravity is 140 degrees and the patella tracks normally. Subcutaneous tissue is closed with 2.0 vicryl and subcuticular with running 4.0 Monocryl. The incision is cleaned and dried and steri-strips and a bulky sterile dressing are applied. The limb is placed into a knee immobilizer and the patient is awakened and transported to recovery in stable condition.      Please note that a surgical assistant was a medical necessity for this procedure in order to perform it in a safe and expeditious manner. Surgical assistant was necessary to retract the ligaments and vital neurovascular structures to prevent injury to them and also necessary for proper positioning of the limb to allow for anatomic placement of the prosthesis.   Dione Plover Clarann Helvey, MD    07/14/2018, 10:44 AM

## 2018-07-14 NOTE — Anesthesia Preprocedure Evaluation (Signed)
Anesthesia Evaluation  Patient identified by MRN, date of birth, ID band Patient awake    Reviewed: Allergy & Precautions, NPO status , Patient's Chart, lab work & pertinent test results, reviewed documented beta blocker date and time   History of Anesthesia Complications (+) PONV and history of anesthetic complications  Airway Mallampati: II  TM Distance: >3 FB Neck ROM: Full    Dental no notable dental hx. (+) Teeth Intact   Pulmonary shortness of breath, with exertion and at rest, sleep apnea and Continuous Positive Airway Pressure Ventilation , pneumonia, resolved,    Pulmonary exam normal breath sounds clear to auscultation       Cardiovascular Normal cardiovascular exam Rhythm:Regular Rate:Normal  Tachycardia Echo 12/07/2017-Normal LV size with mild LV hypertrophy. EF 60-65%. Normal RV  size and systolic function. No significant valvular abnormalities. EKG - NSR , possible ant wall MI   Neuro/Psych  Headaches, PSYCHIATRIC DISORDERS Anxiety Depression Peripheral neuropathy  Neuromuscular disease    GI/Hepatic Neg liver ROS, GERD  Medicated and Controlled,  Endo/Other  Hyperlipidemia  Renal/GU Renal diseaseRight renal calculus  negative genitourinary   Musculoskeletal  (+) Arthritis , Osteoarthritis,  Fibromyalgia -Chronic back pain Spondylosis without radiculopathy or myelopathy   Abdominal (+) + obese,   Peds  Hematology  (+) anemia ,   Anesthesia Other Findings   Reproductive/Obstetrics                             Anesthesia Physical Anesthesia Plan  ASA: III  Anesthesia Plan: Spinal   Post-op Pain Management:  Regional for Post-op pain   Induction: Intravenous  PONV Risk Score and Plan: 3 and Midazolam, Propofol infusion, Ondansetron and Treatment may vary due to age or medical condition  Airway Management Planned: Natural Airway and Simple Face Mask  Additional  Equipment:   Intra-op Plan:   Post-operative Plan:   Informed Consent: I have reviewed the patients History and Physical, chart, labs and discussed the procedure including the risks, benefits and alternatives for the proposed anesthesia with the patient or authorized representative who has indicated his/her understanding and acceptance.   Dental advisory given  Plan Discussed with: CRNA and Surgeon  Anesthesia Plan Comments:         Anesthesia Quick Evaluation

## 2018-07-14 NOTE — Discharge Instructions (Signed)
° °Dr. Frank Aluisio °Total Joint Specialist °[facility] °[address] °[place], [place] [address] °[phone] ° °TOTAL KNEE REPLACEMENT POSTOPERATIVE DIRECTIONS ° °Knee Rehabilitation, Guidelines Following Surgery  °Results after knee surgery are often greatly improved when you follow the exercise, range of motion and muscle strengthening exercises prescribed by your doctor. Safety measures are also important to protect the knee from further injury. Any time any of these exercises cause you to have increased pain or swelling in your knee joint, decrease the amount until you are comfortable again and slowly increase them. If you have problems or questions, call your caregiver or physical therapist for advice.  ° °HOME CARE INSTRUCTIONS  °Remove items at home which could result in a fall. This includes throw rugs or furniture in walking pathways.  °· ICE to the affected knee every three hours for 30 minutes at a time and then as needed for pain and swelling.  Continue to use ice on the knee for pain and swelling from surgery. You may notice swelling that will progress down to the foot and ankle.  This is normal after surgery.  Elevate the leg when you are not up walking on it.   °· Continue to use the breathing machine which will help keep your temperature down.  It is common for your temperature to cycle up and down following surgery, especially at night when you are not up moving around and exerting yourself.  The breathing machine keeps your lungs expanded and your temperature down. °· Do not place pillow under knee, focus on keeping the knee straight while resting ° °DIET °You may resume your previous home diet once your are discharged from the hospital. ° °DRESSING / WOUND CARE / SHOWERING °You may change your dressing every day with sterile gauze.  Please use good hand washing techniques before changing the dressing.  Do not use any lotions or creams on the incision until instructed by your  surgeon. °You may start showering once you are discharged home but do not submerge the incision under water. Just pat the incision dry and apply a dry gauze dressing on daily. °Change the surgical dressing daily and reapply a dry dressing each time. ° °ACTIVITY °Walk with your walker as instructed. °Use walker as long as suggested by your caregivers. °Avoid periods of inactivity such as sitting longer than an hour when not asleep. This helps prevent blood clots.  °You may resume a sexual relationship in one month or when given the OK by your doctor.  °You may return to work once you are cleared by your doctor.  °Do not drive a car for 6 weeks or until released by you surgeon.  °Do not drive while taking narcotics. ° °WEIGHT BEARING °Weight bearing as tolerated with assist device (walker, cane, etc) as directed, use it as long as suggested by your surgeon or therapist, typically at least 4-6 weeks. ° °POSTOPERATIVE CONSTIPATION PROTOCOL °Constipation - defined medically as fewer than three stools per week and severe constipation as less than one stool per week. ° °One of the most common issues patients have following surgery is constipation.  Even if you have a regular bowel pattern at home, your normal regimen is likely to be disrupted due to multiple reasons following surgery.  Combination of anesthesia, postoperative narcotics, change in appetite and fluid intake all can affect your bowels.  In order to avoid complications following surgery, here are some recommendations in order to help you during your recovery period. ° °  Colace (docusate) - Pick up an over-the-counter form of Colace or another stool softener and take twice a day as long as you are requiring postoperative pain medications.  Take with a full glass of water daily.  If you experience loose stools or diarrhea, hold the colace until you stool forms back up.  If your symptoms do not get better within 1 week or if they get worse, check with your  doctor. ° °Dulcolax (bisacodyl) - Pick up over-the-counter and take as directed by the product packaging as needed to assist with the movement of your bowels.  Take with a full glass of water.  Use this product as needed if not relieved by Colace only.  ° °MiraLax (polyethylene glycol) - Pick up over-the-counter to have on hand.  MiraLax is a solution that will increase the amount of water in your bowels to assist with bowel movements.  Take as directed and can mix with a glass of water, juice, soda, coffee, or tea.  Take if you go more than two days without a movement. °Do not use MiraLax more than once per day. Call your doctor if you are still constipated or irregular after using this medication for 7 days in a row. ° °If you continue to have problems with postoperative constipation, please contact the office for further assistance and recommendations.  If you experience "the worst abdominal pain ever" or develop nausea or vomiting, please contact the office immediatly for further recommendations for treatment. ° °ITCHING ° If you experience itching with your medications, try taking only a single pain pill, or even half a pain pill at a time.  You can also use Benadryl over the counter for itching or also to help with sleep.  ° °TED HOSE STOCKINGS °Wear the elastic stockings on both legs for three weeks following surgery during the day but you may remove then at night for sleeping. ° °MEDICATIONS °See your medication summary on the “After Visit Summary” that the nursing staff will review with you prior to discharge.  You may have some home medications which will be placed on hold until you complete the course of blood thinner medication.  It is important for you to complete the blood thinner medication as prescribed by your surgeon.  Continue your approved medications as instructed at time of discharge. ° °PRECAUTIONS °If you experience chest pain or shortness of breath - call 911 immediately for transfer to the  hospital emergency department.  °If you develop a fever greater that 101 F, purulent drainage from wound, increased redness or drainage from wound, foul odor from the wound/dressing, or calf pain - CONTACT YOUR SURGEON.   °                                                °FOLLOW-UP APPOINTMENTS °Make sure you keep all of your appointments after your operation with your surgeon and caregivers. You should call the office at the above phone number and make an appointment for approximately two weeks after the date of your surgery or on the date instructed by your surgeon outlined in the "After Visit Summary". ° ° °RANGE OF MOTION AND STRENGTHENING EXERCISES  °Rehabilitation of the knee is important following a knee injury or an operation. After just a few days of immobilization, the muscles of the thigh which control the knee become weakened and   shrink (atrophy). Knee exercises are designed to build up the tone and strength of the thigh muscles and to improve knee motion. Often times heat used for twenty to thirty minutes before working out will loosen up your tissues and help with improving the range of motion but do not use heat for the first two weeks following surgery. These exercises can be done on a training (exercise) mat, on the floor, on a table or on a bed. Use what ever works the best and is most comfortable for you Knee exercises include:  °Leg Lifts - While your knee is still immobilized in a splint or cast, you can do straight leg raises. Lift the leg to 60 degrees, hold for 3 sec, and slowly lower the leg. Repeat 10-20 times 2-3 times daily. Perform this exercise against resistance later as your knee gets better.  °Quad and Hamstring Sets - Tighten up the muscle on the front of the thigh (Quad) and hold for 5-10 sec. Repeat this 10-20 times hourly. Hamstring sets are done by pushing the foot backward against an object and holding for 5-10 sec. Repeat as with quad sets.  °· Leg Slides: Lying on your back,  slowly slide your foot toward your buttocks, bending your knee up off the floor (only go as far as is comfortable). Then slowly slide your foot back down until your leg is flat on the floor again. °· Angel Wings: Lying on your back spread your legs to the side as far apart as you can without causing discomfort.  °A rehabilitation program following serious knee injuries can speed recovery and prevent re-injury in the future due to weakened muscles. Contact your doctor or a physical therapist for more information on knee rehabilitation.  ° °IF YOU ARE TRANSFERRED TO A SKILLED REHAB FACILITY °If the patient is transferred to a skilled rehab facility following release from the hospital, a list of the current medications will be sent to the facility for the patient to continue.  When discharged from the skilled rehab facility, please have the facility set up the patient's Home Health Physical Therapy prior to being released. Also, the skilled facility will be responsible for providing the patient with their medications at time of release from the facility to include their pain medication, the muscle relaxants, and their blood thinner medication. If the patient is still at the rehab facility at time of the two week follow up appointment, the skilled rehab facility will also need to assist the patient in arranging follow up appointment in our office and any transportation needs. ° °MAKE SURE YOU:  °Understand these instructions.  °Get help right away if you are not doing well or get worse.  ° ° °Pick up stool softner and laxative for home use following surgery while on pain medications. °Do not submerge incision under water. °Please use good hand washing techniques while changing dressing each day. °May shower starting three days after surgery. °Please use a clean towel to pat the incision dry following showers. °Continue to use ice for pain and swelling after surgery. °Do not use any lotions or creams on the incision  until instructed by your surgeon. °

## 2018-07-14 NOTE — Progress Notes (Signed)
Home CPAP set up for pt., remains on 2 lpm oxygen with adapter added, humidifier filled with s/w, made aware to notify if needed.

## 2018-07-14 NOTE — Anesthesia Procedure Notes (Signed)
Spinal  Patient location during procedure: OR End time: 07/14/2018 9:41 AM Staffing Resident/CRNA: Lissa Morales, CRNA Performed: resident/CRNA  Preanesthetic Checklist Completed: patient identified, site marked, surgical consent, pre-op evaluation, timeout performed, IV checked, risks and benefits discussed and monitors and equipment checked Spinal Block Patient position: sitting Prep: Betadine and DuraPrep Patient monitoring: heart rate, continuous pulse ox and blood pressure Approach: midline Location: L4-5 Injection technique: single-shot Needle Needle type: Pencan  Needle gauge: 24 G Needle length: 9 cm Additional Notes Expiration date of kit checked and confirmed. Patient tolerated procedure well, without complications.

## 2018-07-14 NOTE — Anesthesia Procedure Notes (Signed)
Procedure Name: MAC Date/Time: 07/14/2018 9:30 AM Performed by: Lissa Morales, CRNA Pre-anesthesia Checklist: Emergency Drugs available, Suction available, Patient being monitored, Timeout performed and Patient identified Patient Re-evaluated:Patient Re-evaluated prior to induction Oxygen Delivery Method: Simple face mask Placement Confirmation: positive ETCO2

## 2018-07-14 NOTE — Transfer of Care (Signed)
Immediate Anesthesia Transfer of Care Note  Patient: [name]  Procedure(s) Performed: LEFT TOTAL KNEE ARTHROPLASTY (Left Knee)  Patient Location: PACU  Anesthesia Type:Spinal  Level of Consciousness: awake, alert  and patient cooperative  Airway & Oxygen Therapy: Patient Spontanous Breathing and Patient connected to face mask oxygen  Post-op Assessment: Report given to RN and Post -op Vital signs reviewed and stable  Post vital signs: stable  Last Vitals:  Vitals Value Taken Time  BP 97/52 07/14/2018 11:04 AM  Temp    Pulse 68 07/14/2018 11:09 AM  Resp 14 07/14/2018 11:09 AM  SpO2 98 % 07/14/2018 11:09 AM  Vitals shown include unvalidated device data.  Last Pain:  Vitals:   07/14/18 0733  TempSrc:   PainSc: 0-No pain         Complications: No apparent anesthesia complications

## 2018-07-14 NOTE — Interval H&P Note (Signed)
History and Physical Interval Note:  07/14/2018 7:22 AM  [name]  has presented today for surgery, with the diagnosis of left knee osteoarthritis  The various methods of treatment have been discussed with the patient and family. After consideration of risks, benefits and other options for treatment, the patient has consented to  Procedure(s): LEFT TOTAL KNEE ARTHROPLASTY (Left) as a surgical intervention .  The patient's history has been reviewed, patient examined, no change in status, stable for surgery.  I have reviewed the patient's chart and labs.  Questions were answered to the patient's satisfaction.     Pilar Plate Quita Mcgrory

## 2018-07-14 NOTE — Anesthesia Postprocedure Evaluation (Signed)
Anesthesia Post Note  Patient: [name]  Procedure(s) Performed: LEFT TOTAL KNEE ARTHROPLASTY (Left Knee)     Patient location during evaluation: PACU Anesthesia Type: Spinal Level of consciousness: oriented and awake and alert Pain management: pain level controlled Vital Signs Assessment: post-procedure vital signs reviewed and stable Respiratory status: spontaneous breathing, respiratory function stable, patient connected to nasal cannula oxygen and nonlabored ventilation Cardiovascular status: blood pressure returned to baseline and stable Postop Assessment: no headache, no backache, no apparent nausea or vomiting, patient able to bend at knees and spinal receding Anesthetic complications: no    Last Vitals:  Vitals:   07/14/18 1145 07/14/18 1200  BP: (!) 112/58 109/69  Pulse: 72 72  Resp: 18 17  Temp: (!) 36.4 C   SpO2: 97% 97%    Last Pain:  Vitals:   07/14/18 1200  TempSrc:   PainSc: 0-No pain    LLE Motor Response: Purposeful movement (07/14/18 1200) LLE Sensation: Numbness (07/14/18 1200) RLE Motor Response: Purposeful movement (07/14/18 1200) RLE Sensation: Numbness (07/14/18 1200) L Sensory Level: L4-Anterior knee, lower leg (07/14/18 1200) R Sensory Level: L4-Anterior knee, lower leg (07/14/18 1200)  Emalynn Clewis A.

## 2018-07-14 NOTE — Anesthesia Procedure Notes (Signed)
Anesthesia Regional Block: Adductor canal block   Pre-Anesthetic Checklist: ,, timeout performed, Correct Patient, Correct Site, Correct Laterality, Correct Procedure, Correct Position, site marked, Risks and benefits discussed,  Surgical consent,  Pre-op evaluation,  At surgeon's request and post-op pain management  Laterality: Left  Prep: chloraprep       Needles:  Injection technique: Single-shot  Needle Type: Echogenic Stimulator Needle     Needle Length: 10cm  Needle Gauge: 21   Needle insertion depth: 7 cm   Additional Needles:   Procedures:,,,, ultrasound used (permanent image in chart),,,,  Narrative:  Start time: 07/14/2018 9:10 AM End time: 07/14/2018 9:15 AM Injection made incrementally with aspirations every 5 mL.  Performed by: Personally  Anesthesiologist: Josephine Igo, MD  Additional Notes: Timeout performed. Patient sedated. Relevant anatomy ID'd using [place]. Incremental 2-81ml injection of LA with frequent aspiration. Patient tolerated procedure well.       Left Adductor Canal Block

## 2018-07-14 NOTE — Evaluation (Addendum)
Physical Therapy Evaluation Patient Details Name: [name] MRN: [id number] DOB: 1956/02/20 Today's Date: 07/14/2018   History of Present Illness  Patient is s/P  L TKA,  H/O right TKA  Clinical Impression  The patient experiencing numbness of the legs, did stand but unsteady to attempt gait. Recliner pulled up.Pt admitted with above diagnosis. Pt currently with functional limitations due to the deficits listed below (see PT Problem List).  Pt will benefit from skilled PT to increase their independence and safety with mobility to allow discharge to the venue listed below.       Follow Up Recommendations Follow surgeon's recommendation for DC plan and follow-up therapies;Home health PT    Equipment Recommendations  None recommended by PT    Recommendations for Other Services       Precautions / Restrictions Precautions Precautions: Knee;Fall Required Braces or Orthoses: Knee Immobilizer - Left Knee Immobilizer - Left: Discontinue once straight leg raise with < 10 degree lag      Mobility  Bed Mobility Overal bed mobility: Needs Assistance Bed Mobility: Supine to Sit     Supine to sit: Mod assist     General bed mobility comments: assist with trunk and  legs.  Transfers Overall transfer level: Needs assistance Equipment used: Rolling walker (2 wheeled) Transfers: Sit to/from Stand Sit to Stand: Mod assist;+2 safety/equipment         General transfer comment: assist to rise and steady.  Ambulation/Gait Ambulation/Gait assistance: Mod assist;+2 safety/equipment Gait Distance (Feet): 5 Feet Assistive device: Rolling walker (2 wheeled) Gait Pattern/deviations: Step-to pattern     General Gait Details: patient's legs are unsteady, "? renmains with anesthesia. assisted to recliner.  Stairs            Wheelchair Mobility    Modified Rankin (Stroke Patients Only)       Balance                                              Pertinent Vitals/Pain Pain Assessment: 0-10 Pain Score: 6  Pain Location: left knee Pain Descriptors / Indicators: Aching Pain Intervention(s): Premedicated before session;Monitored during session;Ice applied    Home Living Family/patient expects to be discharged to:: Private residence Living Arrangements: Alone Available Help at Discharge: Family Type of Home: House Home Access: Stairs to enter Entrance Stairs-Rails: None Entrance Stairs-Number of Steps: 1/2 + 1 Home Layout: One level Home Equipment: Environmental consultant - 2 wheels;Bedside commode      Prior Function Level of Independence: Independent               Hand Dominance        Extremity/Trunk Assessment   Upper Extremity Assessment Upper Extremity Assessment: Overall WFL for tasks assessed    Lower Extremity Assessment Lower Extremity Assessment: LLE deficits/detail;RLE deficits/detail RLE Deficits / Details: same LLE Deficits / Details: slight  decreased support of the  buttocks to stand and take a step .    Cervical / Trunk Assessment Cervical / Trunk Assessment: Normal  Communication   Communication: No difficulties  Cognition Arousal/Alertness: Awake/alert Behavior During Therapy: WFL for tasks assessed/performed Overall Cognitive Status: Within Functional Limits for tasks assessed  General Comments      Exercises     Assessment/Plan    PT Assessment Patient needs continued PT services  PT Problem List Decreased strength;Decreased range of motion;Decreased activity tolerance;Impaired sensation;Decreased knowledge of precautions;Decreased safety awareness;Decreased knowledge of use of DME;Pain       PT Treatment Interventions DME instruction;Gait training;Stair training;Functional mobility training;Therapeutic activities;Therapeutic exercise;Patient/family education    PT Goals (Current goals can be found in the Care Plan section)  Acute  Rehab PT Goals Patient Stated Goal: to go home PT Goal Formulation: With patient/family Time For Goal Achievement: 07/18/18 Potential to Achieve Goals: Good    Frequency 7X/week   Barriers to discharge        Co-evaluation               AM-PAC PT "6 Clicks" Daily Activity  Outcome Measure Difficulty turning over in bed (including adjusting bedclothes, sheets and blankets)?: A Lot Difficulty moving from lying on back to sitting on the side of the bed? : A Lot Difficulty sitting down on and standing up from a chair with arms (e.g., wheelchair, bedside commode, etc,.)?: Unable Help needed moving to and from a bed to chair (including a wheelchair)?: Total Help needed walking in hospital room?: Total Help needed climbing 3-5 steps with a railing? : Total 6 Click Score: 8    End of Session Equipment Utilized During Treatment: Gait belt Activity Tolerance: Patient tolerated treatment well Patient left: in chair;with call bell/phone within reach;with family/visitor present Nurse Communication: Mobility status PT Visit Diagnosis: Unsteadiness on feet (R26.81)    Time: 1610-9604 PT Time Calculation (min) (ACUTE ONLY): 18 min   Charges:   PT Evaluation $PT Eval Low Complexity: 1 Low          [facility] [phone]   Rada Hay 07/14/2018, 6:06 PM

## 2018-07-15 ENCOUNTER — Encounter (HOSPITAL_COMMUNITY): Payer: Self-pay | Admitting: Orthopedic Surgery

## 2018-07-15 LAB — CBC
HCT: 34.7 % — ABNORMAL LOW (ref 36.0–46.0)
Hemoglobin: 11.1 g/dL — ABNORMAL LOW (ref 12.0–15.0)
MCH: 29.5 pg (ref 26.0–34.0)
MCHC: 32 g/dL (ref 30.0–36.0)
MCV: 92.3 fL (ref 78.0–100.0)
Platelets: 365 10*3/uL (ref 150–400)
RBC: 3.76 MIL/uL — ABNORMAL LOW (ref 3.87–5.11)
RDW: 14.9 % (ref 11.5–15.5)
WBC: 16.3 10*3/uL — ABNORMAL HIGH (ref 4.0–10.5)

## 2018-07-15 LAB — BASIC METABOLIC PANEL
Anion gap: 7 (ref 5–15)
BUN: 14 mg/dL (ref 8–23)
CO2: 26 mmol/L (ref 22–32)
Calcium: 8.6 mg/dL — ABNORMAL LOW (ref 8.9–10.3)
Chloride: 110 mmol/L (ref 98–111)
Creatinine, Ser: 0.69 mg/dL (ref 0.44–1.00)
GFR calc Af Amer: >60 mL/min (ref >60)
GFR calc non Af Amer: >60 mL/min (ref >60)
Glucose, Bld: 143 mg/dL — ABNORMAL HIGH (ref 70–99)
Potassium: 3.9 mmol/L (ref 3.5–5.1)
Sodium: 143 mmol/L (ref 135–145)

## 2018-07-15 MED ORDER — SODIUM CHLORIDE 0.9 % IV BOLUS
500.0000 mL | Freq: Once | INTRAVENOUS | Status: AC
  Administered 2018-07-15: 500 mL via INTRAVENOUS

## 2018-07-15 NOTE — Progress Notes (Signed)
Spoke with patient and spouse at bedside. Confirmed plan for OP PT, already arranged. Has RW and 3n1. [phone] 

## 2018-07-15 NOTE — Progress Notes (Signed)
Subjective: 1 Day Post-Op Procedure(s) (LRB): LEFT TOTAL KNEE ARTHROPLASTY (Left) Patient reports pain as mild.   Patient seen in rounds by Dr. Wynelle Link. Patient is well, and has had no acute complaints or problems. Foley catheter removed this AM. Denies chest pain, SOB or calf pain.  We will continue therapy today.   Objective: Vital signs in last 24 hours: Temp:  [97.3 F (36.3 C)-98.2 F (36.8 C)] 97.6 F (36.4 C) (08/20 0549) Pulse Rate:  [66-91] 66 (08/20 0549) Resp:  [11-18] 15 (08/20 0549) BP: (95-122)/(50-71) 95/52 (08/20 0549) SpO2:  [96 %-100 %] 97 % (08/20 0549) Weight:  [99.3 kg] 99.3 kg (08/19 1241)  Intake/Output from previous day:  Intake/Output Summary (Last 24 hours) at 07/15/2018 0731 Last data filed at 07/15/2018 0600 Gross per 24 hour  Intake 4425.5 ml  Output 2935 ml  Net 1490.5 ml     Labs: Recent Labs    07/15/18 0448  HGB 11.1*   Recent Labs    07/15/18 0448  WBC 16.3*  RBC 3.76*  HCT 34.7*  PLT 365   Recent Labs    07/15/18 0448  NA 143  K 3.9  CL 110  CO2 26  BUN 14  CREATININE 0.69  GLUCOSE 143*  CALCIUM 8.6*    Exam: General - Patient is Alert and Oriented Extremity - Neurologically intact Neurovascular intact Sensation intact distally Dorsiflexion/Plantar flexion intact Dressing - dressing C/D/I Motor Function - intact, moving foot and toes well on exam.   Past Medical History:  Diagnosis Date  . Anemia    previously followed by Dr. Jamse Arn for anemia and elevated platelets  . Anxiety   . C. difficile colitis 10/01/12   treated by [facility]  . Chronic fatigue syndrome   . DDD (degenerative disc disease), lumbar 08/19/14   and facet arthroplasty & left lumbar radiculopathy (Dr.Ramos)  . Depression   . Dyssynergia    dyssynergenic defecation, contributing to fecal incontinence.  . Edema   . Fibromyalgia   . Gastroparesis    followed at [facility]  . GERD (gastroesophageal reflux disease)   . Hyperlipidemia   .  Kidney stone   . Lumbar radiculopathy   . Migraine   . Neuropathy   . Obstructive sleep apnea    Does  wear  CPAP  . Paresthesia    Dr. Everette Rank at [facility]  . Pelvic floor dysfunction    pelvic floor dyssynergy  . Plantar fasciitis 02/2011   R foot  . Pneumonia    2012  . PONV (postoperative nausea and vomiting)    pt states has gastroparesis has difficulty taking antibiotics and narcotics has severe nausea and vomiting   . Restless leg syndrome   . S/P endometrial ablation 08/09/2006   Novasure Ablation  . S/P epidural steroid injection 09/20/14   Dr.Ramos  . Tremor    Dr. Everette Rank  . Urinary frequency   . Urinary incontinence     Assessment/Plan: 1 Day Post-Op Procedure(s) (LRB): LEFT TOTAL KNEE ARTHROPLASTY (Left) Principal Problem:   OA (osteoarthritis) of knee  Estimated body mass index is 40.06 kg/m as calculated from the following:   Height as of this encounter: 5\' 2"  (1.575 m).   Weight as of this encounter: 99.3 kg. Advance diet Up with therapy  Anticipated LOS equal to or greater than 2 midnights due to - Age 5 and older with one or more of the following:  - Obesity  - Expected need for hospital services (PT,  OT, Nursing) required for safe  discharge  - Anticipated need for postoperative skilled nursing care or inpatient rehab  - Active co-morbidities: Anemia OR   - Unanticipated findings during/Post Surgery: None  - Patient is a high risk of re-admission due to: None    DVT Prophylaxis - Aspirin Weight bearing as tolerated. D/C O2 and pulse ox and try on room air. Hemovac pulled without difficulty, will begin therapy today.  BP 95/52 mmHg this AM, 500 mL bolus ordered.  Plan is to go Home after hospital stay. Possible d/c tomorrow if meeting goals with therapy.  Theresa Duty, PA-C Orthopedic Surgery 07/15/2018, 7:31 AM

## 2018-07-15 NOTE — Plan of Care (Signed)
  Problem: Clinical Measurements: Goal: Will remain free from infection Outcome: Progressing Goal: Respiratory complications will improve Outcome: Progressing   Problem: Activity: Goal: Risk for activity intolerance will decrease Outcome: Progressing   Problem: Nutrition: Goal: Adequate nutrition will be maintained Outcome: Progressing   Problem: Elimination: Goal: Will not experience complications related to urinary retention Outcome: Progressing   Problem: Pain Managment: Goal: General experience of comfort will improve Outcome: Progressing

## 2018-07-15 NOTE — Progress Notes (Signed)
Physical Therapy Treatment Patient Details Name: GAYE SCORZA MRN: [id number] DOB: [birth year] Today's Date: 07/15/2018    History of Present Illness Patient is s/P  L TKA,  H/O right TKA    PT Comments    The patient is progressing well. Plans DC tomorrow.   Follow Up Recommendations  Follow surgeon's recommendation for DC plan and follow-up therapies;Outpatient PT     Equipment Recommendations  None recommended by PT    Recommendations for Other Services       Precautions / Restrictions Precautions Precautions: Knee;Fall Required Braces or Orthoses: Knee Immobilizer - Left Knee Immobilizer - Left: Discontinue once straight leg raise with < 10 degree lag    Mobility  Bed Mobility Overal bed mobility: Needs Assistance Bed Mobility: Sit to Supine     Supine to sit: Min assist Sit to supine: Min assist   General bed mobility comments: assist with left   leg  Transfers Overall transfer level: Needs assistance Equipment used: Rolling walker (2 wheeled) Transfers: Sit to/from Stand Sit to Stand: Min assist         General transfer comment: cues for hand and leg position  Ambulation/Gait Ambulation/Gait assistance: Min assist Gait Distance (Feet): 130 Feet Assistive device: Rolling walker (2 wheeled) Gait Pattern/deviations: Step-to pattern;Step-through pattern     General Gait Details: cues for sequence and step length,   Stairs             Wheelchair Mobility    Modified Rankin (Stroke Patients Only)       Balance                                            Cognition Arousal/Alertness: Awake/alert                                            Exercises    General Comments        Pertinent Vitals/Pain Pain Score: 2  Pain Location: left knee Pain Descriptors / Indicators: Discomfort Pain Intervention(s): Monitored during session;Premedicated before session    Home Living                     Prior Function            PT Goals (current goals can now be found in the care plan section) Progress towards PT goals: Progressing toward goals    Frequency    7X/week      PT Plan Current plan remains appropriate    Co-evaluation              AM-PAC PT "6 Clicks" Daily Activity  Outcome Measure  Difficulty turning over in bed (including adjusting bedclothes, sheets and blankets)?: A Lot Difficulty moving from lying on back to sitting on the side of the bed? : A Lot Difficulty sitting down on and standing up from a chair with arms (e.g., wheelchair, bedside commode, etc,.)?: A Lot Help needed moving to and from a bed to chair (including a wheelchair)?: A Little Help needed walking in hospital room?: A Little Help needed climbing 3-5 steps with a railing? : Total 6 Click Score: 13    End of Session Equipment Utilized During Treatment: Gait belt;Left knee immobilizer Activity Tolerance: Patient tolerated treatment well  Patient left: in bed;with call bell/phone within reach;with family/visitor present Nurse Communication: Mobility status PT Visit Diagnosis: Unsteadiness on feet (R26.81)     Time: 1340-1404 PT Time Calculation (min) (ACUTE ONLY): 24 min  Charges:  $Gait Training: 23-37 mins $Therapeutic Exercise: 8-22 mins                        Claretha Cooper 07/15/2018, 3:16 PM

## 2018-07-15 NOTE — Progress Notes (Signed)
Physical Therapy Treatment Patient Details Name: [name] MRN: [id number] DOB: 02-27-1956 Today's Date: 07/15/2018    History of Present Illness Patient is s/P  L TKA,  H/O right TKA    PT Comments    The patient is progressing well. BP is now stabilized and able to progress. Plans Dc tomorrolw.  Follow Up Recommendations  Follow surgeon's recommendation for DC plan and follow-up therapies;Outpatient PT     Equipment Recommendations    none   Recommendations for Other Services       Precautions / Restrictions Precautions Precautions: Knee;Fall Required Braces or Orthoses: Knee Immobilizer - Left Knee Immobilizer - Left: Discontinue once straight leg raise with < 10 degree lag    Mobility  Bed Mobility Overal bed mobility: Needs Assistance Bed Mobility: Supine to Sit     Supine to sit: Min assist     General bed mobility comments: assist with trunk and  legs.  Transfers Overall transfer level: Needs assistance Equipment used: Rolling walker (2 wheeled) Transfers: Sit to/from Stand Sit to Stand: Min assist         General transfer comment: cues for hand and leg position  Ambulation/Gait Ambulation/Gait assistance: Min assist Gait Distance (Feet): 120 Feet Assistive device: Rolling walker (2 wheeled) Gait Pattern/deviations: Step-to pattern;Step-through pattern     General Gait Details: cues for sequence and step length,   Stairs             Wheelchair Mobility    Modified Rankin (Stroke Patients Only)       Balance                                            Cognition Arousal/Alertness: Awake/alert                                            Exercises Total Joint Exercises Ankle Circles/Pumps: AROM;Both;10 reps Quad Sets: AROM;Both;10 reps Short Arc Quad: AROM;Left;10 reps Heel Slides: AAROM;Left;10 reps Hip ABduction/ADduction: AROM;Left;10 reps Straight Leg Raises: AAROM;Left;10 reps Long  Arc Quad: AROM;Left;10 reps Knee Flexion: AROM;Left;10 reps Goniometric ROM: 10-60 left knee flexion    General Comments        Pertinent Vitals/Pain Pain Score: 2  Pain Location: left knee Pain Descriptors / Indicators: Discomfort Pain Intervention(s): Monitored during session;Premedicated before session;Ice applied    Home Living                      Prior Function            PT Goals (current goals can now be found in the care plan section) Progress towards PT goals: Progressing toward goals    Frequency    7X/week      PT Plan Current plan remains appropriate    Co-evaluation              AM-PAC PT "6 Clicks" Daily Activity  Outcome Measure  Difficulty turning over in bed (including adjusting bedclothes, sheets and blankets)?: A Lot Difficulty moving from lying on back to sitting on the side of the bed? : A Lot Difficulty sitting down on and standing up from a chair with arms (e.g., wheelchair, bedside commode, etc,.)?: A Lot Help needed moving to and from a bed  to chair (including a wheelchair)?: A Lot Help needed walking in hospital room?: A Lot Help needed climbing 3-5 steps with a railing? : Total 6 Click Score: 11    End of Session Equipment Utilized During Treatment: Gait belt;Left knee immobilizer Activity Tolerance: Patient tolerated treatment well Patient left: in chair;with call bell/phone within reach;with chair alarm set Nurse Communication: Mobility status PT Visit Diagnosis: Unsteadiness on feet (R26.81)     Time: 9211-9417 PT Time Calculation (min) (ACUTE ONLY): 24 min  Charges:  $Gait Training: 8-22 mins $Therapeutic Exercise: 8-22 mins                     [place] [facility] [phone]    Claretha Cooper 07/15/2018, 3:14 PM

## 2018-07-16 LAB — CBC
HCT: 35.4 % — ABNORMAL LOW (ref 36.0–46.0)
Hemoglobin: 11.2 g/dL — ABNORMAL LOW (ref 12.0–15.0)
MCH: 29.3 pg (ref 26.0–34.0)
MCHC: 31.6 g/dL (ref 30.0–36.0)
MCV: 92.7 fL (ref 78.0–100.0)
Platelets: 375 10*3/uL (ref 150–400)
RBC: 3.82 MIL/uL — ABNORMAL LOW (ref 3.87–5.11)
RDW: 15.4 % (ref 11.5–15.5)
WBC: 16.1 10*3/uL — ABNORMAL HIGH (ref 4.0–10.5)

## 2018-07-16 LAB — BASIC METABOLIC PANEL
Anion gap: 8 (ref 5–15)
BUN: 14 mg/dL (ref 8–23)
CO2: 25 mmol/L (ref 22–32)
Calcium: 8.7 mg/dL — ABNORMAL LOW (ref 8.9–10.3)
Chloride: 109 mmol/L (ref 98–111)
Creatinine, Ser: 0.62 mg/dL (ref 0.44–1.00)
GFR calc Af Amer: >60 mL/min (ref >60)
GFR calc non Af Amer: >60 mL/min (ref >60)
Glucose, Bld: 141 mg/dL — ABNORMAL HIGH (ref 70–99)
Potassium: 3.6 mmol/L (ref 3.5–5.1)
Sodium: 142 mmol/L (ref 135–145)

## 2018-07-16 MED ORDER — GABAPENTIN 300 MG PO CAPS: 300.0000 mg | ORAL_CAPSULE | Freq: Three times a day (TID) | ORAL | 0 refills | Status: DC

## 2018-07-16 MED ORDER — ASPIRIN 325 MG PO TBEC: 325.0000 mg | DELAYED_RELEASE_TABLET | Freq: Two times a day (BID) | ORAL | 0 refills | Status: AC

## 2018-07-16 MED ORDER — METHOCARBAMOL 500 MG PO TABS: 500.0000 mg | ORAL_TABLET | Freq: Four times a day (QID) | ORAL | 0 refills | Status: DC | PRN

## 2018-07-16 MED ORDER — OXYCODONE HCL 5 MG PO TABS: 5.0000 mg | ORAL_TABLET | Freq: Four times a day (QID) | ORAL | 0 refills | Status: DC | PRN

## 2018-07-16 NOTE — Progress Notes (Signed)
Subjective: 2 Days Post-Op Procedure(s) (LRB): LEFT TOTAL KNEE ARTHROPLASTY (Left) Patient reports pain as moderate.   Patient seen in rounds for Dr. Wynelle Link. Patient is well, and has had no acute complaints or problems other than pain in the left knee. Denies chest pain, SOB or calf pain. Voiding without difficulty and positive flatus. States she is ready to go home. Plan is to go Home after hospital stay.  Objective: Vital signs in last 24 hours: Temp:  [97.5 F (36.4 C)-98.3 F (36.8 C)] 98 F (36.7 C) (08/21 0522) Pulse Rate:  [68-92] 76 (08/21 0522) Resp:  [16-20] 20 (08/21 0522) BP: (92-134)/(57-78) 134/70 (08/21 0522) SpO2:  [94 %-98 %] 98 % (08/21 0522)  Intake/Output from previous day:  Intake/Output Summary (Last 24 hours) at 07/16/2018 0811 Last data filed at 07/15/2018 1744 Gross per 24 hour  Intake 1975.01 ml  Output 750 ml  Net 1225.01 ml    Labs: Recent Labs    07/15/18 0448 07/16/18 0510  HGB 11.1* 11.2*   Recent Labs    07/15/18 0448 07/16/18 0510  WBC 16.3* 16.1*  RBC 3.76* 3.82*  HCT 34.7* 35.4*  PLT 365 375   Recent Labs    07/15/18 0448 07/16/18 0510  NA 143 142  K 3.9 3.6  CL 110 109  CO2 26 25  BUN 14 14  CREATININE 0.69 0.62  GLUCOSE 143* 141*  CALCIUM 8.6* 8.7*   Exam: General - Patient is Alert and Oriented Extremity - Neurologically intact Neurovascular intact Sensation intact distally Dorsiflexion/Plantar flexion intact Dressing/Incision - clean, dry, no drainage Motor Function - intact, moving foot and toes well on exam.   Past Medical History:  Diagnosis Date  . Anemia    previously followed by Dr. Jamse Arn for anemia and elevated platelets  . Anxiety   . C. difficile colitis 10/01/12   treated by [facility]  . Chronic fatigue syndrome   . DDD (degenerative disc disease), lumbar 08/19/14   and facet arthroplasty & left lumbar radiculopathy (Dr.Ramos)  . Depression   . Dyssynergia    dyssynergenic defecation,  contributing to fecal incontinence.  . Edema   . Fibromyalgia   . Gastroparesis    followed at [facility]  . GERD (gastroesophageal reflux disease)   . Hyperlipidemia   . Kidney stone   . Lumbar radiculopathy   . Migraine   . Neuropathy   . Obstructive sleep apnea    Does  wear  CPAP  . Paresthesia    Dr. Everette Rank at [facility]  . Pelvic floor dysfunction    pelvic floor dyssynergy  . Plantar fasciitis 02/2011   R foot  . Pneumonia    2012  . PONV (postoperative nausea and vomiting)    pt states has gastroparesis has difficulty taking antibiotics and narcotics has severe nausea and vomiting   . Restless leg syndrome   . S/P endometrial ablation 08/09/2006   Novasure Ablation  . S/P epidural steroid injection 09/20/14   Dr.Ramos  . Tremor    Dr. Everette Rank  . Urinary frequency   . Urinary incontinence     Assessment/Plan: 2 Days Post-Op Procedure(s) (LRB): LEFT TOTAL KNEE ARTHROPLASTY (Left) Principal Problem:   OA (osteoarthritis) of knee  Estimated body mass index is 40.06 kg/m as calculated from the following:   Height as of this encounter: 5\' 2"  (1.575 m).   Weight as of this encounter: 99.3 kg. Up with therapy D/C IV fluids  DVT Prophylaxis - Aspirin Weight-bearing as  tolerated  BP improved from yesterday after bolus. Plan for discharge to home today with outpatient physical therapy at [facility]. Follow-up in the office with Dr. Wynelle Link on September 3rd.   Theresa Duty, PA-C Orthopedic Surgery 07/16/2018, 8:11 AM

## 2018-07-16 NOTE — Discharge Summary (Signed)
Physician Discharge Summary   Patient ID: [name] MRN: [id number] DOB/AGE: 1956-03-02 62 y.o.  Admit date: 07/14/2018 Discharge date: 07/16/2018  Primary Diagnosis: Osteoarthritis, left knee   Admission Diagnoses:  Past Medical History:  Diagnosis Date  . Anemia    previously followed by Dr. Jamse Arn for anemia and elevated platelets  . Anxiety   . C. difficile colitis 10/01/12   treated by [facility]  . Chronic fatigue syndrome   . DDD (degenerative disc disease), lumbar 08/19/14   and facet arthroplasty & left lumbar radiculopathy (Dr.Ramos)  . Depression   . Dyssynergia    dyssynergenic defecation, contributing to fecal incontinence.  . Edema   . Fibromyalgia   . Gastroparesis    followed at [facility]  . GERD (gastroesophageal reflux disease)   . Hyperlipidemia   . Kidney stone   . Lumbar radiculopathy   . Migraine   . Neuropathy   . Obstructive sleep apnea    Does  wear  CPAP  . Paresthesia    Dr. Everette Rank at [facility]  . Pelvic floor dysfunction    pelvic floor dyssynergy  . Plantar fasciitis 02/2011   R foot  . Pneumonia    2012  . PONV (postoperative nausea and vomiting)    pt states has gastroparesis has difficulty taking antibiotics and narcotics has severe nausea and vomiting   . Restless leg syndrome   . S/P endometrial ablation 08/09/2006   Novasure Ablation  . S/P epidural steroid injection 09/20/14   Dr.Ramos  . Tremor    Dr. Everette Rank  . Urinary frequency   . Urinary incontinence    Discharge Diagnoses:   Principal Problem:   OA (osteoarthritis) of knee  Estimated body mass index is 40.06 kg/m as calculated from the following:   Height as of this encounter: '5\' 2"'  (1.575 m).   Weight as of this encounter: 99.3 kg.  Procedure:  Procedure(s) (LRB): LEFT TOTAL KNEE ARTHROPLASTY (Left)   Consults: None  HPI: [name] is a 62 y.o. year old female with end stage OA of her left knee with progressively worsening pain and dysfunction. She  has constant pain, with activity and at rest and significant functional deficits with difficulties even with ADLs. She has had extensive non-op management including analgesics, injections of cortisone and viscosupplements, and home exercise program, but remains in significant pain with significant dysfunction. Radiographs show bone on bone arthritis patellofemoral. She presents now for left Total Knee Arthroplasty.    Laboratory Data: Admission on 07/14/2018, Discharged on 07/16/2018  Component Date Value Ref Range Status  . WBC 07/15/2018 16.3* 4.0 - 10.5 K/uL Final  . RBC 07/15/2018 3.76* 3.87 - 5.11 MIL/uL Final  . Hemoglobin 07/15/2018 11.1* 12.0 - 15.0 g/dL Final  . HCT 07/15/2018 34.7* 36.0 - 46.0 % Final  . MCV 07/15/2018 92.3  78.0 - 100.0 fL Final  . MCH 07/15/2018 29.5  26.0 - 34.0 pg Final  . MCHC 07/15/2018 32.0  30.0 - 36.0 g/dL Final  . RDW 07/15/2018 14.9  11.5 - 15.5 % Final  . Platelets 07/15/2018 365  150 - 400 K/uL Final   Performed at [facility], [place] [address] [place], [place] [address]  . Sodium 07/15/2018 143  135 - 145 mmol/L Final  . Potassium 07/15/2018 3.9  3.5 - 5.1 mmol/L Final  . Chloride 07/15/2018 110  98 - 111 mmol/L Final  . CO2 07/15/2018 26  22 - 32 mmol/L Final  . Glucose,  Bld 07/15/2018 143* 70 - 99 mg/dL Final  . BUN 07/15/2018 14  8 - 23 mg/dL Final  . Creatinine, Ser 07/15/2018 0.69  0.44 - 1.00 mg/dL Final  . Calcium 07/15/2018 8.6* 8.9 - 10.3 mg/dL Final  . GFR calc non Af Amer 07/15/2018 >60  >60 mL/min Final  . GFR calc Af Amer 07/15/2018 >60  >60 mL/min Final   Comment: (NOTE) The eGFR has been calculated using the CKD EPI equation. This calculation has not been validated in all clinical situations. eGFR's persistently <60 mL/min signify possible Chronic Kidney Disease.   Georgiann Hahn gap 07/15/2018 7  5 - 15 Final   Performed at [facility], [place] [address] [place], [place] [address]  . WBC 07/16/2018  16.1* 4.0 - 10.5 K/uL Final  . RBC 07/16/2018 3.82* 3.87 - 5.11 MIL/uL Final  . Hemoglobin 07/16/2018 11.2* 12.0 - 15.0 g/dL Final  . HCT 07/16/2018 35.4* 36.0 - 46.0 % Final  . MCV 07/16/2018 92.7  78.0 - 100.0 fL Final  . MCH 07/16/2018 29.3  26.0 - 34.0 pg Final  . MCHC 07/16/2018 31.6  30.0 - 36.0 g/dL Final  . RDW 07/16/2018 15.4  11.5 - 15.5 % Final  . Platelets 07/16/2018 375  150 - 400 K/uL Final   Performed at [facility], [place] [address], [place], [place] [address]  . Sodium 07/16/2018 142  135 - 145 mmol/L Final  . Potassium 07/16/2018 3.6  3.5 - 5.1 mmol/L Final  . Chloride 07/16/2018 109  98 - 111 mmol/L Final  . CO2 07/16/2018 25  22 - 32 mmol/L Final  . Glucose, Bld 07/16/2018 141* 70 - 99 mg/dL Final  . BUN 07/16/2018 14  8 - 23 mg/dL Final  . Creatinine, Ser 07/16/2018 0.62  0.44 - 1.00 mg/dL Final  . Calcium 07/16/2018 8.7* 8.9 - 10.3 mg/dL Final  . GFR calc non Af Amer 07/16/2018 >60  >60 mL/min Final  . GFR calc Af Amer 07/16/2018 >60  >60 mL/min Final   Comment: (NOTE) The eGFR has been calculated using the CKD EPI equation. This calculation has not been validated in all clinical situations. eGFR's persistently <60 mL/min signify possible Chronic Kidney Disease.   Georgiann Hahn gap 07/16/2018 8  5 - 15 Final   Performed at [facility], [place] [address] [place], [place] [address]  Hospital Outpatient Visit on 07/08/2018  Component Date Value Ref Range Status  . MRSA, PCR 07/08/2018 NEGATIVE  NEGATIVE Final  . Staphylococcus aureus 07/08/2018 POSITIVE* NEGATIVE Final   Comment: (NOTE) The Xpert SA Assay (FDA approved for NASAL specimens in patients 59 years of age and older), is one component of a comprehensive surveillance program. It is not intended to diagnose infection nor to guide or monitor treatment. Performed at [facility], [place] [address], [place], [place] [address]   . aPTT 07/08/2018 32  24 - 36  seconds Final   Performed at [facility], [place] [address] [place], [place] [address]  . WBC 07/08/2018 9.9  4.0 - 10.5 K/uL Final  . RBC 07/08/2018 4.20  3.87 - 5.11 MIL/uL Final  . Hemoglobin 07/08/2018 12.2  12.0 - 15.0 g/dL Final  . HCT 07/08/2018 39.0  36.0 - 46.0 % Final  . MCV 07/08/2018 92.9  78.0 - 100.0 fL Final  . MCH 07/08/2018 29.0  26.0 - 34.0 pg Final  . MCHC 07/08/2018 31.3  30.0 - 36.0 g/dL Final  .  RDW 07/08/2018 15.2  11.5 - 15.5 % Final  . Platelets 07/08/2018 388  150 - 400 K/uL Final   Performed at [facility], [place] [address] [place], [place] [address]  . Sodium 07/08/2018 143  135 - 145 mmol/L Final  . Potassium 07/08/2018 5.1  3.5 - 5.1 mmol/L Final  . Chloride 07/08/2018 107  98 - 111 mmol/L Final  . CO2 07/08/2018 27  22 - 32 mmol/L Final  . Glucose, Bld 07/08/2018 96  70 - 99 mg/dL Final  . BUN 07/08/2018 25* 8 - 23 mg/dL Final  . Creatinine, Ser 07/08/2018 0.84  0.44 - 1.00 mg/dL Final  . Calcium 07/08/2018 9.4  8.9 - 10.3 mg/dL Final  . Total Protein 07/08/2018 7.4  6.5 - 8.1 g/dL Final  . Albumin 07/08/2018 3.7  3.5 - 5.0 g/dL Final  . AST 07/08/2018 20  15 - 41 U/L Final  . ALT 07/08/2018 21  0 - 44 U/L Final  . Alkaline Phosphatase 07/08/2018 117  38 - 126 U/L Final  . Total Bilirubin 07/08/2018 0.4  0.3 - 1.2 mg/dL Final  . GFR calc non Af Amer 07/08/2018 >60  >60 mL/min Final  . GFR calc Af Amer 07/08/2018 >60  >60 mL/min Final   Comment: (NOTE) The eGFR has been calculated using the CKD EPI equation. This calculation has not been validated in all clinical situations. eGFR's persistently <60 mL/min signify possible Chronic Kidney Disease.   Georgiann Hahn gap 07/08/2018 9  5 - 15 Final   Performed at [facility], [place] [address] [place], [place] [address]  . Prothrombin Time 07/08/2018 12.5  11.4 - 15.2 seconds Final  . INR 07/08/2018 0.94   Final   Performed at [facility],  [place] [address], [place], [place] [address]  . ABO/RH(D) 07/08/2018 O POS   Final  . Antibody Screen 07/08/2018 NEG   Final  . Sample Expiration 07/08/2018 07/17/2018   Final  . Extend sample reason 07/08/2018    Final                   Value:NO TRANSFUSIONS OR PREGNANCY IN THE PAST 3 MONTHS Performed at [facility], [place] [address] [place], [place] [address]   . Color, Urine 07/08/2018 YELLOW  YELLOW Final  . APPearance 07/08/2018 CLEAR  CLEAR Final  . Specific Gravity, Urine 07/08/2018 1.025  1.005 - 1.030 Final  . pH 07/08/2018 7.0  5.0 - 8.0 Final  . Glucose, UA 07/08/2018 NEGATIVE  NEGATIVE mg/dL Final  . Hgb urine dipstick 07/08/2018 NEGATIVE  NEGATIVE Final  . Bilirubin Urine 07/08/2018 NEGATIVE  NEGATIVE Final  . Ketones, ur 07/08/2018 NEGATIVE  NEGATIVE mg/dL Final  . Protein, ur 07/08/2018 NEGATIVE  NEGATIVE mg/dL Final  . Nitrite 07/08/2018 NEGATIVE  NEGATIVE Final  . Leukocytes, UA 07/08/2018 SMALL* NEGATIVE Final  . WBC, UA 07/08/2018 6-10  0 - 5 WBC/hpf Final  . Bacteria, UA 07/08/2018 RARE* NONE SEEN Final  . Squamous Epithelial / LPF 07/08/2018 0-5  0 - 5 Final   Performed at [facility], [place] [address] [place], [place] [address]  Office Visit on 06/16/2018  Component Date Value Ref Range Status  . WBC 06/16/2018 9.0  3.4 - 10.8 x10E3/uL Final  . RBC 06/16/2018 4.14  3.77 - 5.28 x10E6/uL Final  . Hemoglobin 06/16/2018 11.6  11.1 - 15.9 g/dL Final  . Hematocrit 06/16/2018 37.5  34.0 - 46.6 %  Final  . MCV 06/16/2018 91  79 - 97 fL Final  . MCH 06/16/2018 28.0  26.6 - 33.0 pg Final  . MCHC 06/16/2018 30.9* 31.5 - 35.7 g/dL Final  . RDW 06/16/2018 15.3  12.3 - 15.4 % Final  . Platelets 06/16/2018 387  150 - 450 x10E3/uL Final  . Neutrophils 06/16/2018 63  Not Estab. % Final  . Lymphs 06/16/2018 22  Not Estab. % Final  . Monocytes 06/16/2018 11  Not Estab. % Final  . Eos 06/16/2018 3  Not Estab. % Final  . Basos 06/16/2018 0   Not Estab. % Final  . Neutrophils Absolute 06/16/2018 5.7  1.4 - 7.0 x10E3/uL Final  . Lymphocytes Absolute 06/16/2018 2.0  0.7 - 3.1 x10E3/uL Final  . Monocytes Absolute 06/16/2018 1.0* 0.1 - 0.9 x10E3/uL Final  . EOS (ABSOLUTE) 06/16/2018 0.3  0.0 - 0.4 x10E3/uL Final  . Basophils Absolute 06/16/2018 0.0  0.0 - 0.2 x10E3/uL Final  . Immature Granulocytes 06/16/2018 1  Not Estab. % Final  . Immature Grans (Abs) 06/16/2018 0.1  0.0 - 0.1 x10E3/uL Final  . Glucose 06/16/2018 92  65 - 99 mg/dL Final  . BUN 06/16/2018 17  8 - 27 mg/dL Final  . Creatinine, Ser 06/16/2018 0.82  0.57 - 1.00 mg/dL Final  . GFR calc non Af Amer 06/16/2018 77  >59 mL/min/1.73 Final  . GFR calc Af Amer 06/16/2018 89  >59 mL/min/1.73 Final  . BUN/Creatinine Ratio 06/16/2018 21  12 - 28 Final  . Sodium 06/16/2018 142  134 - 144 mmol/L Final  . Potassium 06/16/2018 4.9  3.5 - 5.2 mmol/L Final  . Chloride 06/16/2018 103  96 - 106 mmol/L Final  . CO2 06/16/2018 22  20 - 29 mmol/L Final  . Calcium 06/16/2018 8.9  8.7 - 10.3 mg/dL Final  . Total Protein 06/16/2018 6.3  6.0 - 8.5 g/dL Final  . Albumin 06/16/2018 3.5* 3.6 - 4.8 g/dL Final  . Globulin, Total 06/16/2018 2.8  1.5 - 4.5 g/dL Final  . Albumin/Globulin Ratio 06/16/2018 1.3  1.2 - 2.2 Final  . Bilirubin Total 06/16/2018 0.2  0.0 - 1.2 mg/dL Final  . Alkaline Phosphatase 06/16/2018 142* 39 - 117 IU/L Final  . AST 06/16/2018 49* 0 - 40 IU/L Final  . ALT 06/16/2018 51* 0 - 32 IU/L Final  . Color, UA 06/16/2018 yellow  yellow Final  . Clarity, UA 06/16/2018 clear  clear Final  . Glucose, UA 06/16/2018 negative  negative mg/dL Final  . Bilirubin, UA 06/16/2018 negative  negative Final  . Ketones, POC UA 06/16/2018 negative  negative mg/dL Final  . Specific Gravity, Urine 06/16/2018 1.030   Final  . Blood, UA 06/16/2018 negative  negative Final  . pH, UA 06/16/2018 6.0  5.0 - 8.0 Final  . Protein Ur, POC 06/16/2018 trace* negative mg/dL Final  . Urobilinogen, Ur  06/16/2018 neg   Final  . Nitrite, UA 06/16/2018 Negative  Negative Final  . Leukocytes, UA 06/16/2018 Small (1+)* Negative Final     X-Rays:Mm 3d Screen Breast Bilateral  Result Date: 06/20/2018 CLINICAL DATA:  Screening. EXAM: DIGITAL SCREENING BILATERAL MAMMOGRAM WITH TOMO AND CAD COMPARISON:  Previous exam(s). ACR Breast Density Category b: There are scattered areas of fibroglandular density. FINDINGS: There are no findings suspicious for malignancy. Images were processed with CAD. IMPRESSION: No mammographic evidence of malignancy. A result letter of this screening mammogram will be mailed directly to the patient. RECOMMENDATION: Screening mammogram in one year. (Code:SM-B-01Y)  BI-RADS CATEGORY  1: Negative. Electronically Signed   By: Lillia Mountain M.D.   On: 06/20/2018 14:41    EKG: Orders placed or performed in visit on 12/18/17  . EKG 12-Lead     Hospital Course: [name] is a 62 y.o. who was admitted to [facility]. They were brought to the operating room on 07/14/2018 and underwent Procedure(s): LEFT TOTAL KNEE ARTHROPLASTY.  Patient tolerated the procedure well and was later transferred to the recovery room and then to the orthopaedic floor for postoperative care. They were given PO and IV analgesics for pain control following their surgery. They were given 24 hours of postoperative antibiotics of  Anti-infectives (From admission, onward)   Start     Dose/Rate Route Frequency Ordered Stop   07/14/18 1600  ceFAZolin (ANCEF) IVPB 2g/100 mL premix     2 g 200 mL/hr over 30 Minutes Intravenous Every 6 hours 07/14/18 1252 07/14/18 2201   07/14/18 0715  ceFAZolin (ANCEF) IVPB 2g/100 mL premix     2 g 200 mL/hr over 30 Minutes Intravenous On call to O.R. 07/14/18 [id number] 07/14/18 0934     and started on DVT prophylaxis in the form of Aspirin.   PT and OT were ordered for total joint protocol. Discharge planning consulted to help with postop disposition and equipment needs.   Patient had a decent night on the evening of surgery. They started to get up OOB with therapy on POD #0. Hemovac drain was pulled without difficulty on day one. BP was running on lower side, 500 mL bolus ordered. Continued to work with therapy into POD #2. Pt was seen during rounds on day two and was ready to go home pending progress with therapy. Dressing was changed and the incision was clean, dry, and intact with no drainage. BP was much improved. Pt worked with therapy for one additional session and was meeting their goals. She was discharged to home later that day in stable condition.  Diet: Regular diet Activity: WBAT Follow-up: in 2 weeks with Dr. Wynelle Link  Disposition: Home with outpatient physical therapy at [facility] Discharged Condition: stable   Discharge Instructions    Call MD / Call 911   Complete by:  As directed    If you experience chest pain or shortness of breath, CALL 911 and be transported to the hospital emergency room.  If you develope a fever above 101 F, pus (white drainage) or increased drainage or redness at the wound, or calf pain, call your surgeon's office.   Change dressing   Complete by:  As directed    Change the dressing daily with sterile 4 x 4 inch gauze dressing and apply TED hose.   Constipation Prevention   Complete by:  As directed    Drink plenty of fluids.  Prune juice may be helpful.  You may use a stool softener, such as Colace (over the counter) 100 mg twice a day.  Use MiraLax (over the counter) for constipation as needed.   Diet - low sodium heart healthy   Complete by:  As directed    Discharge instructions   Complete by:  As directed    Dr. Gaynelle Arabian Total Joint Specialist [facility] [address] [place], [place] [address] [phone]  TOTAL KNEE REPLACEMENT POSTOPERATIVE DIRECTIONS  Knee Rehabilitation, Guidelines Following Surgery  Results after knee surgery are often greatly improved when you follow the  exercise, range of motion and muscle strengthening exercises prescribed by your  doctor. Safety measures are also important to protect the knee from further injury. Any time any of these exercises cause you to have increased pain or swelling in your knee joint, decrease the amount until you are comfortable again and slowly increase them. If you have problems or questions, call your caregiver or physical therapist for advice.   HOME CARE INSTRUCTIONS  Remove items at home which could result in a fall. This includes throw rugs or furniture in walking pathways.  ICE to the affected knee every three hours for 30 minutes at a time and then as needed for pain and swelling.  Continue to use ice on the knee for pain and swelling from surgery. You may notice swelling that will progress down to the foot and ankle.  This is normal after surgery.  Elevate the leg when you are not up walking on it.   Continue to use the breathing machine which will help keep your temperature down.  It is common for your temperature to cycle up and down following surgery, especially at night when you are not up moving around and exerting yourself.  The breathing machine keeps your lungs expanded and your temperature down. Do not place pillow under knee, focus on keeping the knee straight while resting   DIET You may resume your previous home diet once your are discharged from the hospital.  DRESSING / WOUND CARE / SHOWERING You may shower 3 days after surgery, but keep the wounds dry during showering.  You may use an occlusive plastic wrap (Press'n Seal for example), NO SOAKING/SUBMERGING IN THE BATHTUB.  If the bandage gets wet, change with a clean dry gauze.  If the incision gets wet, pat the wound dry with a clean towel. You may start showering once you are discharged home but do not submerge the incision under water. Just pat the incision dry and apply a dry gauze dressing on daily. Change the surgical dressing daily and reapply  a dry dressing each time.  ACTIVITY Walk with your walker as instructed. Use walker as long as suggested by your caregivers. Avoid periods of inactivity such as sitting longer than an hour when not asleep. This helps prevent blood clots.  You may resume a sexual relationship in one month or when given the OK by your doctor.  You may return to work once you are cleared by your doctor.  Do not drive a car for 6 weeks or until released by you surgeon.  Do not drive while taking narcotics.  WEIGHT BEARING Weight bearing as tolerated with assist device (walker, cane, etc) as directed, use it as long as suggested by your surgeon or therapist, typically at least 4-6 weeks.  POSTOPERATIVE CONSTIPATION PROTOCOL Constipation - defined medically as fewer than three stools per week and severe constipation as less than one stool per week.  One of the most common issues patients have following surgery is constipation.  Even if you have a regular bowel pattern at home, your normal regimen is likely to be disrupted due to multiple reasons following surgery.  Combination of anesthesia, postoperative narcotics, change in appetite and fluid intake all can affect your bowels.  In order to avoid complications following surgery, here are some recommendations in order to help you during your recovery period.  Colace (docusate) - Pick up an over-the-counter form of Colace or another stool softener and take twice a day as long as you are requiring postoperative pain medications.  Take with a full glass of water  daily.  If you experience loose stools or diarrhea, hold the colace until you stool forms back up.  If your symptoms do not get better within 1 week or if they get worse, check with your doctor.  Dulcolax (bisacodyl) - Pick up over-the-counter and take as directed by the product packaging as needed to assist with the movement of your bowels.  Take with a full glass of water.  Use this product as needed if not  relieved by Colace only.   MiraLax (polyethylene glycol) - Pick up over-the-counter to have on hand.  MiraLax is a solution that will increase the amount of water in your bowels to assist with bowel movements.  Take as directed and can mix with a glass of water, juice, soda, coffee, or tea.  Take if you go more than two days without a movement. Do not use MiraLax more than once per day. Call your doctor if you are still constipated or irregular after using this medication for 7 days in a row.  If you continue to have problems with postoperative constipation, please contact the office for further assistance and recommendations.  If you experience "the worst abdominal pain ever" or develop nausea or vomiting, please contact the office immediatly for further recommendations for treatment.  ITCHING  If you experience itching with your medications, try taking only a single pain pill, or even half a pain pill at a time.  You can also use Benadryl over the counter for itching or also to help with sleep.   TED HOSE STOCKINGS Wear the elastic stockings on both legs for three weeks following surgery during the day but you may remove then at night for sleeping.  MEDICATIONS See your medication summary on the "After Visit Summary" that the nursing staff will review with you prior to discharge.  You may have some home medications which will be placed on hold until you complete the course of blood thinner medication.  It is important for you to complete the blood thinner medication as prescribed by your surgeon.  Continue your approved medications as instructed at time of discharge.  PRECAUTIONS If you experience chest pain or shortness of breath - call 911 immediately for transfer to the hospital emergency department.  If you develop a fever greater that 101 F, purulent drainage from wound, increased redness or drainage from wound, foul odor from the wound/dressing, or calf pain - CONTACT YOUR SURGEON.                                                    FOLLOW-UP APPOINTMENTS Make sure you keep all of your appointments after your operation with your surgeon and caregivers. You should call the office at the above phone number and make an appointment for approximately two weeks after the date of your surgery or on the date instructed by your surgeon outlined in the "After Visit Summary".   RANGE OF MOTION AND STRENGTHENING EXERCISES  Rehabilitation of the knee is important following a knee injury or an operation. After just a few days of immobilization, the muscles of the thigh which control the knee become weakened and shrink (atrophy). Knee exercises are designed to build up the tone and strength of the thigh muscles and to improve knee motion. Often times heat used for twenty to thirty minutes before working out will loosen  up your tissues and help with improving the range of motion but do not use heat for the first two weeks following surgery. These exercises can be done on a training (exercise) mat, on the floor, on a table or on a bed. Use what ever works the best and is most comfortable for you Knee exercises include:  Leg Lifts - While your knee is still immobilized in a splint or cast, you can do straight leg raises. Lift the leg to 60 degrees, hold for 3 sec, and slowly lower the leg. Repeat 10-20 times 2-3 times daily. Perform this exercise against resistance later as your knee gets better.  Quad and Hamstring Sets - Tighten up the muscle on the front of the thigh (Quad) and hold for 5-10 sec. Repeat this 10-20 times hourly. Hamstring sets are done by pushing the foot backward against an object and holding for 5-10 sec. Repeat as with quad sets.  Leg Slides: Lying on your back, slowly slide your foot toward your buttocks, bending your knee up off the floor (only go as far as is comfortable). Then slowly slide your foot back down until your leg is flat on the floor again. Angel Wings: Lying on your back  spread your legs to the side as far apart as you can without causing discomfort.  A rehabilitation program following serious knee injuries can speed recovery and prevent re-injury in the future due to weakened muscles. Contact your doctor or a physical therapist for more information on knee rehabilitation.   IF YOU ARE TRANSFERRED TO A SKILLED REHAB FACILITY If the patient is transferred to a skilled rehab facility following release from the hospital, a list of the current medications will be sent to the facility for the patient to continue.  When discharged from the skilled rehab facility, please have the facility set up the patient's [place] prior to being released. Also, the skilled facility will be responsible for providing the patient with their medications at time of release from the facility to include their pain medication, the muscle relaxants, and their blood thinner medication. If the patient is still at the rehab facility at time of the two week follow up appointment, the skilled rehab facility will also need to assist the patient in arranging follow up appointment in our office and any transportation needs.  MAKE SURE YOU:  Understand these instructions.  Get help right away if you are not doing well or get worse.    Pick up stool softner and laxative for home use following surgery while on pain medications. Do not submerge incision under water. Please use good hand washing techniques while changing dressing each day. May shower starting three days after surgery. Please use a clean towel to pat the incision dry following showers. Continue to use ice for pain and swelling after surgery. Do not use any lotions or creams on the incision until instructed by your surgeon.   Do not put a pillow under the knee. Place it under the heel.   Complete by:  As directed    Driving restrictions   Complete by:  As directed    No driving for two weeks   TED hose   Complete by:   As directed    Use stockings (TED hose) for three weeks on both leg(s).  You may remove them at night for sleeping.   Weight bearing as tolerated   Complete by:  As directed      Allergies as of  07/16/2018      Reactions   Erythromycin Nausea Only   Abdominal pain   Tramadol Itching   Dilaudid [hydromorphone Hcl] Itching      Medication List    STOP taking these medications   aspirin-acetaminophen-caffeine 250-250-65 MG tablet Commonly known as:  EXCEDRIN MIGRAINE   meloxicam 15 MG tablet Commonly known as:  MOBIC     TAKE these medications   acetaminophen 500 MG tablet Commonly known as:  TYLENOL Take 1,000 mg by mouth 3 (three) times daily as needed for moderate pain.   albuterol 108 (90 Base) MCG/ACT inhaler Commonly known as:  PROVENTIL HFA;VENTOLIN HFA Inhale 1-2 puffs into the lungs every 6 (six) hours as needed for wheezing or shortness of breath. Reported on 05/30/2016   allopurinol 300 MG tablet Commonly known as:  ZYLOPRIM TAKE 1 TABLET (300 MG TOTAL) DAILY BY MOUTH.   ALPRAZolam 0.25 MG tablet Commonly known as:  XANAX Take 0.25 mg by mouth 3 (three) times daily as needed for anxiety.   ARIPiprazole 5 MG tablet Commonly known as:  ABILIFY Take 5 mg by mouth daily.   Armodafinil 250 MG tablet TAKE 1 TABLET EVERY DAY WITH BREAKFAST   aspirin 325 MG EC tablet Take 1 tablet (325 mg total) by mouth 2 (two) times daily for 19 days. Take one tablet (325 mg) Aspirin two times a day for three weeks following surgery. Then take one baby Aspirin (81 mg) once a day for three weeks. Then discontinue aspirin.   atorvastatin 40 MG tablet Commonly known as:  LIPITOR TAKE 1 TABLET BY MOUTH EVERY DAY   cetirizine 10 MG tablet Commonly known as:  ZYRTEC Take 10 mg by mouth at bedtime.   cholecalciferol 1000 units tablet Commonly known as:  VITAMIN D Take 1,000 Units by mouth daily.   colchicine 0.6 MG tablet Take 2 tablets by mouth at onset of gout flare.  Taken an additional tablet 1 hour later, if needed. May take like this every 3d, if needed What changed:    how much to take  how to take this  when to take this  reasons to take this  additional instructions   DULoxetine 60 MG capsule Commonly known as:  CYMBALTA Take 60 mg by mouth 2 (two) times daily.   Fish Oil 1200 MG Caps Take 1,200 mg by mouth daily.   fluticasone 50 MCG/ACT nasal spray Commonly known as:  FLONASE PLACE 2 SPRAYS INTO BOTH NOSTRILS DAILY AS NEEDED FOR ALLERGIES What changed:  See the new instructions.   fluticasone 50 MCG/ACT nasal spray Commonly known as:  FLONASE PLACE 2 SPRAYS INTO BOTH NOSTRILS DAILY AS NEEDED FOR ALLERGIES What changed:  Another medication with the same name was changed. Make sure you understand how and when to take each.   gabapentin 300 MG capsule Commonly known as:  NEURONTIN Take 1 capsule (300 mg total) by mouth 3 (three) times daily. Gabapentin 300 mg Protocol Take a 300 mg capsule three times a day for two weeks following surgery. Then take a 300 mg capsule two times a day for two weeks.  Then resume a 300 mg capsule once a day at bedtime. What changed:    when to take this  additional instructions   ICAPS AREDS 2 Caps Take 1 capsule by mouth 2 (two) times daily.   Melatonin 10 MG Tabs Take 10 mg by mouth at bedtime.   methocarbamol 500 MG tablet Commonly known as:  ROBAXIN Take 1 tablet (  500 mg total) by mouth every 6 (six) hours as needed for muscle spasms. What changed:  See the new instructions.   metoprolol tartrate 50 MG tablet Commonly known as:  LOPRESSOR Take 1 tablet (50 mg total) by mouth 2 (two) times daily.   nortriptyline 25 MG capsule Commonly known as:  PAMELOR Take 75 mg by mouth at bedtime.   ondansetron 8 MG tablet Commonly known as:  ZOFRAN Take 8 mg by mouth every 8 (eight) hours as needed for nausea or vomiting.   oxyCODONE 5 MG immediate release tablet Commonly known as:  Oxy  IR/ROXICODONE Take 1-2 tablets (5-10 mg total) by mouth every 6 (six) hours as needed for moderate pain (pain score 4-6).   pantoprazole 40 MG tablet Commonly known as:  PROTONIX Take 40 mg by mouth 2 (two) times daily.   PHILLIPS COLON HEALTH Caps Take 1 capsule by mouth at bedtime.   prochlorperazine 10 MG tablet Commonly known as:  COMPAZINE Take 10 mg by mouth every 6 (six) hours as needed for nausea or vomiting.   promethazine 25 MG suppository Commonly known as:  PHENERGAN Place 25 mg rectally every 6 (six) hours as needed for nausea or vomiting.   ranitidine 300 MG tablet Commonly known as:  ZANTAC Take 300 mg by mouth 2 (two) times daily.   SOMBRA NATURAL PAIN RELIEVING EX Apply 1 application topically daily as needed (back pain).   SYSTANE COMPLETE 0.6 % Soln Generic drug:  Propylene Glycol Place 1 drop into both eyes 2 (two) times daily.   topiramate 25 MG tablet Commonly known as:  TOPAMAX TAKE 2 TABLETS (50 MG TOTAL) BY MOUTH AT BEDTIME.   vitamin B-12 1000 MCG tablet Commonly known as:  CYANOCOBALAMIN Take 1,000 mcg by mouth daily.            Discharge Care Instructions  (From admission, onward)         Start     Ordered   07/16/18 0000  Weight bearing as tolerated     07/16/18 0815   07/16/18 0000  Change dressing    Comments:  Change the dressing daily with sterile 4 x 4 inch gauze dressing and apply TED hose.   07/16/18 0815         Follow-up Information    Gaynelle Arabian, MD. Schedule an appointment as soon as possible for a visit on 07/29/2018.   Specialty:  Orthopedic Surgery Contact information: [address] [place] [address] [phone]           Signed: Theresa Duty, PA-C Orthopedic Surgery 07/16/2018, 12:22 PM

## 2018-07-16 NOTE — Progress Notes (Signed)
Physical Therapy Treatment Patient Details Name: [name] MRN: [id number] DOB: [birth year] Today's Date: 07/16/2018    History of Present Illness Patient is s/P  L TKA,  H/O right TKA    PT Comments    The patient is very drowsy, falling asleep during exercises. Had pain meds 2 hours prior.  Patient is progressing well. HEP provided. Plans OPPT. Ready for DC.  Follow Up Recommendations  Follow surgeon's recommendation for DC plan and follow-up therapies;Outpatient PT     Equipment Recommendations  None recommended by PT    Recommendations for Other Services       Precautions / Restrictions Precautions Precautions: Knee;Fall Precaution Comments: did not use KI    Mobility  Bed Mobility Overal bed mobility: Needs Assistance Bed Mobility: Supine to Sit     Supine to sit: Supervision;HOB elevated     General bed mobility comments: no assistance required  Transfers   Equipment used: Rolling walker (2 wheeled) Transfers: Sit to/from Stand Sit to Stand: Min guard         General transfer comment: cues for hand and leg position  Ambulation/Gait Ambulation/Gait assistance: Min guard Gait Distance (Feet): 100 Feet Assistive device: Rolling walker (2 wheeled) Gait Pattern/deviations: Step-to pattern;Step-through pattern;Antalgic     General Gait Details: cues for sequence and step length,   Stairs             Wheelchair Mobility    Modified Rankin (Stroke Patients Only)       Balance                                            Cognition Arousal/Alertness: Lethargic;Suspect due to medications Behavior During Therapy: [facility] for tasks assessed/performed                                          Exercises Total Joint Exercises Ankle Circles/Pumps: AROM;Both;10 reps;Supine Quad Sets: AROM;Both;10 reps;Supine Short Arc Quad: AROM;Left;10 reps;Supine Heel Slides: AAROM;Left;10 reps;Supine Hip  ABduction/ADduction: AROM;Left;10 reps;Supine Straight Leg Raises: AAROM;Left;10 reps;Supine Goniometric ROM: 10-60 left knee flexion    General Comments        Pertinent Vitals/Pain Pain Score: 7  Pain Location: left knee Pain Descriptors / Indicators: Discomfort Pain Intervention(s): Monitored during session;Premedicated before session;Ice applied    Home Living                      Prior Function            PT Goals (current goals can now be found in the care plan section) Progress towards PT goals: Progressing toward goals    Frequency    7X/week      PT Plan Current plan remains appropriate    Co-evaluation              AM-PAC PT "6 Clicks" Daily Activity  Outcome Measure  Difficulty turning over in bed (including adjusting bedclothes, sheets and blankets)?: A Little Difficulty moving from lying on back to sitting on the side of the bed? : A Little Difficulty sitting down on and standing up from a chair with arms (e.g., wheelchair, bedside commode, etc,.)?: A Little Help needed moving to and from a bed to chair (including a wheelchair)?: A Little   Help  needed climbing 3-5 steps with a railing? : Total 6 Click Score: 13    End of Session Equipment Utilized During Treatment: Gait belt Activity Tolerance: Patient tolerated treatment well Patient left: in chair;with chair alarm set;with call bell/phone within reach Nurse Communication: Mobility status PT Visit Diagnosis: Unsteadiness on feet (R26.81)     Time: 4982-6415 PT Time Calculation (min) (ACUTE ONLY): 37 min  Charges:  $Gait Training: 8-22 mins $Therapeutic Exercise: 8-22 mins                        Claretha Cooper 07/16/2018, 9:51 AM

## 2018-07-18 DIAGNOSIS — M25562 Pain in left knee: Secondary | ICD-10-CM | POA: Diagnosis not present

## 2018-07-21 DIAGNOSIS — M25562 Pain in left knee: Secondary | ICD-10-CM | POA: Diagnosis not present

## 2018-07-23 ENCOUNTER — Emergency Department (HOSPITAL_COMMUNITY): Payer: Medicare HMO

## 2018-07-23 ENCOUNTER — Other Ambulatory Visit: Payer: Self-pay

## 2018-07-23 ENCOUNTER — Encounter (HOSPITAL_COMMUNITY): Payer: Self-pay

## 2018-07-23 ENCOUNTER — Emergency Department (HOSPITAL_COMMUNITY)
Admission: EM | Admit: 2018-07-23 | Discharge: 2018-07-23 | Disposition: A | Discharge status: Home / Self Care | Payer: Medicare HMO | Attending: Emergency Medicine | Admitting: Emergency Medicine

## 2018-07-23 DIAGNOSIS — Z79899 Other long term (current) drug therapy: Secondary | ICD-10-CM | POA: Insufficient documentation

## 2018-07-23 DIAGNOSIS — F419 Anxiety disorder, unspecified: Secondary | ICD-10-CM | POA: Diagnosis not present

## 2018-07-23 DIAGNOSIS — Z7982 Long term (current) use of aspirin: Secondary | ICD-10-CM | POA: Insufficient documentation

## 2018-07-23 DIAGNOSIS — Z9049 Acquired absence of other specified parts of digestive tract: Secondary | ICD-10-CM | POA: Insufficient documentation

## 2018-07-23 DIAGNOSIS — F329 Major depressive disorder, single episode, unspecified: Secondary | ICD-10-CM | POA: Diagnosis not present

## 2018-07-23 DIAGNOSIS — R0602 Shortness of breath: Secondary | ICD-10-CM | POA: Diagnosis not present

## 2018-07-23 DIAGNOSIS — J9801 Acute bronchospasm: Secondary | ICD-10-CM | POA: Insufficient documentation

## 2018-07-23 DIAGNOSIS — R69 Illness, unspecified: Secondary | ICD-10-CM | POA: Diagnosis not present

## 2018-07-23 DIAGNOSIS — Z96653 Presence of artificial knee joint, bilateral: Secondary | ICD-10-CM | POA: Insufficient documentation

## 2018-07-23 LAB — BASIC METABOLIC PANEL
Anion gap: 10 (ref 5–15)
BUN: 22 mg/dL (ref 8–23)
CO2: 28 mmol/L (ref 22–32)
Calcium: 8.9 mg/dL (ref 8.9–10.3)
Chloride: 104 mmol/L (ref 98–111)
Creatinine, Ser: 0.79 mg/dL (ref 0.44–1.00)
GFR calc Af Amer: >60 mL/min (ref >60)
GFR calc non Af Amer: >60 mL/min (ref >60)
Glucose, Bld: 113 mg/dL — ABNORMAL HIGH (ref 70–99)
Potassium: 4.1 mmol/L (ref 3.5–5.1)
Sodium: 142 mmol/L (ref 135–145)

## 2018-07-23 LAB — CBC WITH DIFFERENTIAL/PLATELET
Basophils Absolute: 0.1 10*3/uL (ref 0.0–0.1)
Basophils Relative: 0 %
Eosinophils Absolute: 0.4 10*3/uL (ref 0.0–0.7)
Eosinophils Relative: 3 %
HCT: 34.5 % — ABNORMAL LOW (ref 36.0–46.0)
Hemoglobin: 10.9 g/dL — ABNORMAL LOW (ref 12.0–15.0)
Lymphocytes Relative: 9 %
Lymphs Abs: 1.3 10*3/uL (ref 0.7–4.0)
MCH: 28.9 pg (ref 26.0–34.0)
MCHC: 31.6 g/dL (ref 30.0–36.0)
MCV: 91.5 fL (ref 78.0–100.0)
Monocytes Absolute: 1.2 10*3/uL — ABNORMAL HIGH (ref 0.1–1.0)
Monocytes Relative: 8 %
Neutro Abs: 12 10*3/uL — ABNORMAL HIGH (ref 1.7–7.7)
Neutrophils Relative %: 80 %
Platelets: 485 10*3/uL — ABNORMAL HIGH (ref 150–400)
RBC: 3.77 MIL/uL — ABNORMAL LOW (ref 3.87–5.11)
RDW: 15.6 % — ABNORMAL HIGH (ref 11.5–15.5)
WBC: 15 10*3/uL — ABNORMAL HIGH (ref 4.0–10.5)

## 2018-07-23 MED ORDER — IPRATROPIUM BROMIDE 0.02 % IN SOLN
0.5000 mg | Freq: Once | RESPIRATORY_TRACT | Status: AC
  Administered 2018-07-23: 0.5 mg via RESPIRATORY_TRACT
  Filled 2018-07-23: qty 2.5

## 2018-07-23 MED ORDER — OXYCODONE HCL 5 MG PO TABS
5.0000 mg | ORAL_TABLET | Freq: Once | ORAL | Status: AC
  Administered 2018-07-23: 5 mg via ORAL
  Filled 2018-07-23: qty 1

## 2018-07-23 MED ORDER — ALBUTEROL SULFATE HFA 108 (90 BASE) MCG/ACT IN AERS: 1.0000 | INHALATION_SPRAY | Freq: Four times a day (QID) | RESPIRATORY_TRACT | 0 refills | Status: DC | PRN

## 2018-07-23 MED ORDER — PREDNISONE 50 MG PO TABS: ORAL_TABLET | ORAL | 0 refills | Status: DC

## 2018-07-23 MED ORDER — ALBUTEROL (5 MG/ML) CONTINUOUS INHALATION SOLN
10.0000 mg/h | INHALATION_SOLUTION | Freq: Once | RESPIRATORY_TRACT | Status: AC
  Administered 2018-07-23: 10 mg/h via RESPIRATORY_TRACT
  Filled 2018-07-23: qty 20

## 2018-07-23 MED ORDER — IOPAMIDOL (ISOVUE-370) INJECTION 76%
INTRAVENOUS | Status: AC
  Filled 2018-07-23: qty 100

## 2018-07-23 MED ORDER — IOPAMIDOL (ISOVUE-370) INJECTION 76%
100.0000 mL | Freq: Once | INTRAVENOUS | Status: AC | PRN
  Administered 2018-07-23: 100 mL via INTRAVENOUS

## 2018-07-23 MED ORDER — IOPAMIDOL (ISOVUE-300) INJECTION 61%
INTRAVENOUS | Status: AC
  Filled 2018-07-23: qty 100

## 2018-07-23 MED ORDER — METHYLPREDNISOLONE SODIUM SUCC 125 MG IJ SOLR
125.0000 mg | Freq: Once | INTRAMUSCULAR | Status: AC
  Administered 2018-07-23: 125 mg via INTRAVENOUS
  Filled 2018-07-23: qty 2

## 2018-07-23 MED ORDER — SODIUM CHLORIDE 0.9 % IV SOLN
INTRAVENOUS | Status: DC
  Administered 2018-07-23: 20 mL/h via INTRAVENOUS

## 2018-07-23 NOTE — ED Provider Notes (Signed)
[place] DEPT Provider Note   CSN: [id number] Arrival date & time: 07/23/18  5638     History   Chief Complaint Chief Complaint  Patient presents with  . Shortness of Breath    HPI BRET STAMOUR is a 62 y.o. female.  62 year old female with history of left knee surgery a week ago presents with shortness of breath that began last night.  Patient states that she does not have any underlying lung conditions and that she has had similar symptoms after a prior knee replacement on the right side.  Wheezing has been associate with nonproductive cough and responsive to albuterol at times.  She denies any increased dyspnea on exertion.  Denies any pleuritic chest pain.  Has had no postoperative problems from her knee.  Is currently using a walker at this time.  Called her doctor and was told to come in for further evaluation.     Past Medical History:  Diagnosis Date  . Anemia    previously followed by Dr. Jamse Arn for anemia and elevated platelets  . Anxiety   . C. difficile colitis 10/01/12   treated by [facility]  . Chronic fatigue syndrome   . DDD (degenerative disc disease), lumbar 08/19/14   and facet arthroplasty & left lumbar radiculopathy (Dr.Ramos)  . Depression   . Dyssynergia    dyssynergenic defecation, contributing to fecal incontinence.  . Edema   . Fibromyalgia   . Gastroparesis    followed at [facility]  . GERD (gastroesophageal reflux disease)   . Hyperlipidemia   . Kidney stone   . Lumbar radiculopathy   . Migraine   . Neuropathy   . Obstructive sleep apnea    Does  wear  CPAP  . Paresthesia    Dr. Everette Rank at [facility]  . Pelvic floor dysfunction    pelvic floor dyssynergy  . Plantar fasciitis 02/2011   R foot  . Pneumonia    2012  . PONV (postoperative nausea and vomiting)    pt states has gastroparesis has difficulty taking antibiotics and narcotics has severe nausea and vomiting   . Restless leg syndrome   . S/P  endometrial ablation 08/09/2006   Novasure Ablation  . S/P epidural steroid injection 09/20/14   Dr.Ramos  . Tremor    Dr. Everette Rank  . Urinary frequency   . Urinary incontinence     Patient Active Problem List   Diagnosis Date Noted  . Dyspnea 12/26/2017  . Restrictive lung disease secondary to obesity 12/26/2017  . Atypical chest pain 12/06/2017  . Sinus tachycardia 12/06/2017  . Chest pain 12/06/2017  . DJD (degenerative joint disease), cervical 12/24/2016  . Primary osteoarthritis of both hips 12/24/2016  . Primary osteoarthritis of both knees 12/24/2016  . H/O total knee replacement, right 12/24/2016  . Spondylosis of lumbar region without myelopathy or radiculopathy 12/24/2016  . Acute gout 05/30/2016  . Myalgia 03/29/2016  . Other long term (current) drug therapy 03/29/2016  . Idiopathic peripheral neuropathy 03/29/2016  . Cannot sleep 03/29/2016  . Migraine without aura and responsive to treatment 03/29/2016  . Multifocal myoclonus 03/29/2016  . Restless leg 03/29/2016  . Has a tremor 03/29/2016  . History of aspiration pneumonitis 01/25/2016  . History of acute bronchitis 01/25/2016  . LPRD (laryngopharyngeal reflux disease) 01/25/2016  . Imbalance 01/09/2016  . Serotonin syndrome 12/22/2015  . OA (osteoarthritis) of knee 09/05/2015  . Obesity 05/17/2015  . OSA (obstructive sleep apnea) 03/16/2013  . Iron deficiency 11/06/2012  .  Thrombocythemia ([place]) 11/06/2012  . Leukocytosis 11/05/2012  . Impaired fasting glucose 07/23/2012  . Bronchitis 04/03/2012  . Kidney stone on left side 03/05/2012  . Kidney cysts 03/04/2012  . Loose stools 03/04/2012  . Urinary frequency 12/04/2011  . Restless leg syndrome 12/04/2011  . Polypharmacy 12/04/2011  . Bladder incontinence 12/04/2011  . Fibromyalgia   . Hyperlipidemia   . Edema   . S/P endometrial ablation   . Allergic rhinitis 09/18/2011  . Depression, major, single episode, in partial remission ([place]) 04/21/2011  .  PRECORDIAL PAIN 02/17/2010  . Anxiety state 01/30/2010  . Migraine 01/30/2010  . GERD 01/30/2010  . Gastroparesis 01/30/2010    Past Surgical History:  Procedure Laterality Date  . CHOLECYSTECTOMY  9/05  . ENDOMETRIAL ABLATION  08/09/2006   Dr. Valentina Shaggy Ablation  . FACET JOINT INJECTION  04/17/2017   Left L4-5 and L5-S1  . KNEE ARTHROPLASTY    . KNEE SURGERY  1999   R knee, Dr. Eddie Dibbles, torn cartilage  . RIGHT/LEFT HEART CATH AND CORONARY ANGIOGRAPHY N/A 01/01/2018   Procedure: RIGHT/LEFT HEART CATH AND CORONARY ANGIOGRAPHY;  Surgeon: Sherren Mocha, MD;  Location: [place] [facility];  Service: Cardiovascular;  Laterality: N/A;  . TONSILLECTOMY  1968  . TONSILLECTOMY    . TOTAL KNEE ARTHROPLASTY Right 09/05/2015   Procedure: RIGHT TOTAL KNEE ARTHROPLASTY;  Surgeon: Gaynelle Arabian, MD;  Location: [facility];  Service: Orthopedics;  Laterality: Right;  . TOTAL KNEE ARTHROPLASTY Left 07/14/2018   Procedure: LEFT TOTAL KNEE ARTHROPLASTY;  Surgeon: Gaynelle Arabian, MD;  Location: [facility];  Service: Orthopedics;  Laterality: Left;  . ULTRASOUND GUIDANCE FOR VASCULAR ACCESS  01/01/2018   Procedure: Ultrasound Guidance For Vascular Access;  Surgeon: Sherren Mocha, MD;  Location: [place] [facility];  Service: Cardiovascular;;     OB History    Gravida  1   Para  1   Term  1   Preterm      AB      Living  1     SAB      TAB      Ectopic      Multiple      Live Births               Home Medications    Prior to Admission medications   Medication Sig Start Date End Date Taking? Authorizing Provider  acetaminophen (TYLENOL) 500 MG tablet Take 1,000 mg by mouth 3 (three) times daily as needed for moderate pain.    [provider]  albuterol (PROVENTIL HFA;VENTOLIN HFA) 108 (90 Base) MCG/ACT inhaler Inhale 1-2 puffs into the lungs every 6 (six) hours as needed for wheezing or shortness of breath. Reported on 05/30/2016 06/17/17   Noralee Space, MD  allopurinol  (ZYLOPRIM) 300 MG tablet TAKE 1 TABLET (300 MG TOTAL) DAILY BY MOUTH. 06/16/18   Rita Ohara, MD  ALPRAZolam Duanne Moron) 0.25 MG tablet Take 0.25 mg by mouth 3 (three) times daily as needed for anxiety.     [provider]  ARIPiprazole (ABILIFY) 5 MG tablet Take 5 mg by mouth daily. 04/23/17   [provider]  Armodafinil 250 MG tablet TAKE 1 TABLET EVERY DAY WITH BREAKFAST 05/23/18   Bo Merino, MD  aspirin EC 325 MG EC tablet Take 1 tablet (325 mg total) by mouth 2 (two) times daily for 19 days. Take one tablet (325 mg) Aspirin two times a day for three weeks following surgery. Then take one baby Aspirin (81  mg) once a day for three weeks. Then discontinue aspirin. 07/16/18 08/04/18  Edmisten, Ok Anis, PA  atorvastatin (LIPITOR) 40 MG tablet TAKE 1 TABLET BY MOUTH EVERY DAY 06/24/18   Rita Ohara, MD  cetirizine (ZYRTEC) 10 MG tablet Take 10 mg by mouth at bedtime.    [provider]  cholecalciferol (VITAMIN D) 1000 units tablet Take 1,000 Units by mouth daily.    [provider]  colchicine 0.6 MG tablet Take 2 tablets by mouth at onset of gout flare. Taken an additional tablet 1 hour later, if needed. May take like this every 3d, if needed Patient taking differently: Take 0.6 mg by mouth daily as needed (gout flares).  12/19/16   Rita Ohara, MD  DULoxetine (CYMBALTA) 60 MG capsule Take 60 mg by mouth 2 (two) times daily.  05/15/16   [provider]  fluticasone (FLONASE) 50 MCG/ACT nasal spray PLACE 2 SPRAYS INTO BOTH NOSTRILS DAILY AS NEEDED FOR ALLERGIES Patient taking differently: Place 2 sprays into both nostrils daily at night 05/16/18   Rita Ohara, MD  fluticasone (FLONASE) 50 MCG/ACT nasal spray PLACE 2 SPRAYS INTO BOTH NOSTRILS DAILY AS NEEDED FOR ALLERGIES 07/07/18   Rita Ohara, MD  gabapentin (NEURONTIN) 300 MG capsule Take 1 capsule (300 mg total) by mouth 3 (three) times daily. Gabapentin 300 mg Protocol Take a 300 mg capsule three times a day  for two weeks following surgery. Then take a 300 mg capsule two times a day for two weeks.  Then resume a 300 mg capsule once a day at bedtime. 07/16/18   Edmisten, Kristie L, PA  Melatonin 10 MG TABS Take 10 mg by mouth at bedtime.    [provider]  Menthol-Camphor [facility] NATURAL PAIN RELIEVING EX) Apply 1 application topically daily as needed (back pain).    [provider]  methocarbamol (ROBAXIN) 500 MG tablet Take 1 tablet (500 mg total) by mouth every 6 (six) hours as needed for muscle spasms. 07/16/18   Edmisten, Kristie L, PA  metoprolol tartrate (LOPRESSOR) 50 MG tablet Take 1 tablet (50 mg total) by mouth 2 (two) times daily. 12/18/17 07/02/18  Isaiah Serge, NP  Multiple Vitamins-Minerals (ICAPS AREDS 2) CAPS Take 1 capsule by mouth 2 (two) times daily.    [provider]  nortriptyline (PAMELOR) 25 MG capsule Take 75 mg by mouth at bedtime.     [provider]  Omega-3 Fatty Acids (FISH OIL) 1200 MG CAPS Take 1,200 mg by mouth daily.    [provider]  ondansetron (ZOFRAN) 8 MG tablet Take 8 mg by mouth every 8 (eight) hours as needed for nausea or vomiting.    [provider]  oxyCODONE (OXY IR/ROXICODONE) 5 MG immediate release tablet Take 1-2 tablets (5-10 mg total) by mouth every 6 (six) hours as needed for moderate pain (pain score 4-6). 07/16/18   Edmisten, Kristie L, PA  pantoprazole (PROTONIX) 40 MG tablet Take 40 mg by mouth 2 (two) times daily.    [provider]  Probiotic Product ([place]) CAPS Take 1 capsule by mouth at bedtime.     [provider]  prochlorperazine (COMPAZINE) 10 MG tablet Take 10 mg by mouth every 6 (six) hours as needed for nausea or vomiting.     [provider]  promethazine (PHENERGAN) 25 MG suppository Place 25 mg rectally every 6 (six) hours as needed for nausea or vomiting.     [provider]  Propylene Glycol (SYSTANE  COMPLETE) 0.6 % SOLN Place 1  drop into both eyes 2 (two) times daily.    [provider]  ranitidine (ZANTAC) 300 MG tablet Take 300 mg by mouth 2 (two) times daily.     [provider]  topiramate (TOPAMAX) 25 MG tablet TAKE 2 TABLETS (50 MG TOTAL) BY MOUTH AT BEDTIME. 05/12/18   Deveshwar, Abel Presto, MD  vitamin B-12 (CYANOCOBALAMIN) 1000 MCG tablet Take 1,000 mcg by mouth daily.    [provider]  carbamazepine (TEGRETOL XR) 100 MG 12 hr tablet Take 200 mg by mouth daily.    02/06/12  [provider]  Eszopiclone 3 MG TABS Take 3 mg by mouth at bedtime. Take immediately before bedtime  02/06/12  [provider]    Family History Family History  Problem Relation Age of Onset  . Allergies Mother   . Hypertension Mother   . Heart disease Mother        possible valve problem - leaking valve  . Macular degeneration Mother   . Heart disease Father        pacemaker, CHF  . Hypertension Father   . Diabetes Father        borderline  . Stroke Father 53  . Kidney disease Father   . Asthma Sister   . Irritable bowel syndrome Sister   . Allergies Sister   . Heart disease Paternal Grandmother   . Heart disease Paternal Grandfather   . Cancer Maternal Aunt        leukemia  . Cancer Maternal Aunt   . Colon cancer Maternal Aunt        late 35's  . CAD Neg Hx     Social History Social History   Tobacco Use  . Smoking status: Never Smoker  . Smokeless tobacco: Never Used  Substance Use Topics  . Alcohol use: No    Alcohol/week: 0.0 standard drinks  . Drug use: No     Allergies   Erythromycin; Tramadol; and Dilaudid [hydromorphone hcl]   Review of Systems Review of Systems  All other systems reviewed and are negative.    Physical Exam Updated Vital Signs BP 117/74 (BP Location: Left Arm)   Temp 98.4 F (36.9 C) (Oral)   Resp 18   Ht 1.575 m (5\' 2" )   Wt 103 kg   LMP 07/27/2006   SpO2 99%   BMI 41.52 kg/m   Physical Exam  Constitutional: She is  oriented to person, place, and time. She appears well-developed and well-nourished.  Non-toxic appearance. No distress.  HENT:  Head: Normocephalic and atraumatic.  Eyes: Pupils are equal, round, and reactive to light. Conjunctivae, EOM and lids are normal.  Neck: Normal range of motion. Neck supple. No tracheal deviation present. No thyroid mass present.  Cardiovascular: Normal rate, regular rhythm and normal heart sounds. Exam reveals no gallop.  No murmur heard. Pulmonary/Chest: Effort normal. No stridor. No respiratory distress. She has decreased breath sounds in the right lower field and the left lower field. She has wheezes in the right lower field and the left lower field. She has no rhonchi. She has no rales.  Abdominal: Soft. Normal appearance and bowel sounds are normal. She exhibits no distension. There is no tenderness. There is no rebound and no CVA tenderness.  Musculoskeletal: Normal range of motion. She exhibits no edema or tenderness.       Legs: Neurological: She is alert and oriented to person, place, and time. She has normal strength. No  cranial nerve deficit or sensory deficit. GCS eye subscore is 4. GCS verbal subscore is 5. GCS motor subscore is 6.  Skin: Skin is warm and dry. No abrasion and no rash noted.  Psychiatric: She has a normal mood and affect. Her speech is normal and behavior is normal.  Nursing note and vitals reviewed.    ED Treatments / Results  Labs (all labs ordered are listed, but only abnormal results are displayed) Labs Reviewed - No data to display  EKG None  Radiology No results found.  Procedures Procedures (including critical care time)  Medications Ordered in ED Medications  0.9 %  sodium chloride infusion (has no administration in time range)  ipratropium (ATROVENT) nebulizer solution 0.5 mg (has no administration in time range)  methylPREDNISolone sodium succinate (SOLU-MEDROL) 125 mg/2 mL injection 125 mg (has no administration in  time range)  albuterol (PROVENTIL,VENTOLIN) solution continuous neb (has no administration in time range)     Initial Impression / Assessment and Plan / ED Course  I have reviewed the triage vital signs and the nursing notes.  Pertinent labs & imaging results that were available during my care of the patient were reviewed by me and considered in my medical decision making (see chart for details).     Patient given albuterol along with Solu-Medrol.  Concern for possible pulmonary embolism given that she had surgery recently and chest CT negative for PE.  Repeat exam shows improved aeration.  Will prescribe short course of prednisone as well as albuterol inhaler.  Final Clinical Impressions(s) / ED Diagnoses   Final diagnoses:  None    ED Discharge Orders    None       Lacretia Leigh, MD 07/23/18 1117

## 2018-07-23 NOTE — ED Triage Notes (Signed)
Pt presents to ED from home for shortness of breath. Pt reports that she had L knee replacement done 10 days ago. Pt reports SOB that started Monday night, but got really bad this morning. Pt has rescue inhaler from previous illness, and it seemed to help a little bit.

## 2018-07-25 ENCOUNTER — Ambulatory Visit (INDEPENDENT_AMBULATORY_CARE_PROVIDER_SITE_OTHER): Payer: Medicare HMO | Admitting: Medical

## 2018-07-25 VITALS — BP 130/80 | HR 86 | Temp 98.0°F | Resp 18 | Ht 63.0 in | Wt 226.8 lb

## 2018-07-25 DIAGNOSIS — R0602 Shortness of breath: Secondary | ICD-10-CM | POA: Diagnosis not present

## 2018-07-25 DIAGNOSIS — H6690 Otitis media, unspecified, unspecified ear: Secondary | ICD-10-CM | POA: Diagnosis not present

## 2018-07-25 DIAGNOSIS — Z9889 Other specified postprocedural states: Secondary | ICD-10-CM

## 2018-07-25 DIAGNOSIS — M25562 Pain in left knee: Secondary | ICD-10-CM | POA: Diagnosis not present

## 2018-07-25 DIAGNOSIS — R05 Cough: Secondary | ICD-10-CM

## 2018-07-25 DIAGNOSIS — R058 Other specified cough: Secondary | ICD-10-CM

## 2018-07-25 MED ORDER — AMOXICILLIN 875 MG PO TABS: 875.0000 mg | ORAL_TABLET | Freq: Two times a day (BID) | ORAL | 0 refills | Status: DC

## 2018-07-25 NOTE — Progress Notes (Signed)
Subjective: Chief Complaint  Patient presents with  . wheezing    knee surgery 07-14-18, wheezing X Monday   Here for wheezing.  accompanied by husband.     Had left TKR 2 weeks ago.  Went to emergency dept this week 2 days ago for wheezing, SOB.   Was given solumedrol in hospital, albuterol.     CT chest 07/23/18 reviewed which showed negative for PE, but there was bilat subsegmental atelectasis and s/p prior cholecystectomy.  BMET and CBC reviewed from 07/23/18.   Today is a little better, but this morning had hard time wheezing.  Was worse Wednesday morning.    Has sore throat, left ear feels stuffed up, ringing in left ear.  No calve pain or new leg swelling.  Still has pain over left knee, bandages in place for knee.     Has been going to PT since surgery.  This morning was 3rd visit since surgery.  No other aggravating or relieving factors. No other complaint.   Past Medical History:  Diagnosis Date  . Anemia    previously followed by Dr. Jamse Arn for anemia and elevated platelets  . Anxiety   . C. difficile colitis 10/01/12   treated by [facility]  . Chronic fatigue syndrome   . DDD (degenerative disc disease), lumbar 08/19/14   and facet arthroplasty & left lumbar radiculopathy (Dr.Ramos)  . Depression   . Dyssynergia    dyssynergenic defecation, contributing to fecal incontinence.  . Edema   . Fibromyalgia   . Gastroparesis    followed at [facility]  . GERD (gastroesophageal reflux disease)   . Hyperlipidemia   . Kidney stone   . Lumbar radiculopathy   . Migraine   . Neuropathy   . Obstructive sleep apnea    Does  wear  CPAP  . Paresthesia    Dr. Everette Rank at [facility]  . Pelvic floor dysfunction    pelvic floor dyssynergy  . Plantar fasciitis 02/2011   R foot  . Pneumonia    2012  . PONV (postoperative nausea and vomiting)    pt states has gastroparesis has difficulty taking antibiotics and narcotics has severe nausea and vomiting   . Restless leg syndrome    . S/P endometrial ablation 08/09/2006   Novasure Ablation  . S/P epidural steroid injection 09/20/14   Dr.Ramos  . Tremor    Dr. Everette Rank  . Urinary frequency   . Urinary incontinence    Current Outpatient Medications on File Prior to Visit  Medication Sig Dispense Refill  . acetaminophen (TYLENOL) 500 MG tablet Take 1,000 mg by mouth 3 (three) times daily as needed for moderate pain.    Marland Kitchen albuterol (PROVENTIL HFA;VENTOLIN HFA) 108 (90 Base) MCG/ACT inhaler Inhale 1-2 puffs into the lungs every 6 (six) hours as needed for wheezing or shortness of breath. 1 Inhaler 0  . allopurinol (ZYLOPRIM) 300 MG tablet TAKE 1 TABLET (300 MG TOTAL) DAILY BY MOUTH. 90 tablet 0  . ALPRAZolam (XANAX) 0.5 MG tablet Take 0.5 mg by mouth 3 (three) times daily as needed for anxiety.  0  . ARIPiprazole (ABILIFY) 5 MG tablet Take 5 mg by mouth daily.  5  . Armodafinil 250 MG tablet TAKE 1 TABLET EVERY DAY WITH BREAKFAST (Patient taking differently: Take 250 mg by mouth daily with breakfast. ) 90 tablet 0  . aspirin EC 325 MG EC tablet Take 1 tablet (325 mg total) by mouth 2 (two) times daily for 19 days. Take one tablet (  325 mg) Aspirin two times a day for three weeks following surgery. Then take one baby Aspirin (81 mg) once a day for three weeks. Then discontinue aspirin. 38 tablet 0  . atorvastatin (LIPITOR) 40 MG tablet TAKE 1 TABLET BY MOUTH EVERY DAY (Patient taking differently: Take 40 mg by mouth daily at 6 PM. ) 90 tablet 0  . cetirizine (ZYRTEC) 10 MG tablet Take 10 mg by mouth at bedtime.    . cholecalciferol (VITAMIN D) 1000 units tablet Take 1,000 Units by mouth daily.    . colchicine 0.6 MG tablet Take 2 tablets by mouth at onset of gout flare. Taken an additional tablet 1 hour later, if needed. May take like this every 3d, if needed (Patient taking differently: Take 0.6 mg by mouth daily as needed (gout flares). ) 30 tablet 0  . DULoxetine (CYMBALTA) 60 MG capsule Take 60 mg by mouth 2 (two) times  daily.     . fluticasone (FLONASE) 50 MCG/ACT nasal spray PLACE 2 SPRAYS INTO BOTH NOSTRILS DAILY AS NEEDED FOR ALLERGIES (Patient taking differently: Place 2 sprays into both nostrils daily as needed for allergies. ) 16 g 1  . gabapentin (NEURONTIN) 300 MG capsule Take 1 capsule (300 mg total) by mouth 3 (three) times daily. Gabapentin 300 mg Protocol Take a 300 mg capsule three times a day for two weeks following surgery. Then take a 300 mg capsule two times a day for two weeks.  Then resume a 300 mg capsule once a day at bedtime. 84 capsule 0  . Melatonin 10 MG TABS Take 10 mg by mouth at bedtime.    . Menthol-Camphor (SOMBRA NATURAL PAIN RELIEVING EX) Apply 1 application topically daily as needed (back pain).    . methocarbamol (ROBAXIN) 500 MG tablet Take 1 tablet (500 mg total) by mouth every 6 (six) hours as needed for muscle spasms. 40 tablet 0  . Multiple Vitamins-Minerals (ICAPS AREDS 2) CAPS Take 1 capsule by mouth 2 (two) times daily.    . nortriptyline (PAMELOR) 25 MG capsule Take 75 mg by mouth at bedtime.     . Omega-3 Fatty Acids (FISH OIL) 1200 MG CAPS Take 1,200 mg by mouth daily.    . ondansetron (ZOFRAN) 8 MG tablet Take 8 mg by mouth every 8 (eight) hours as needed for nausea.   0  . oxyCODONE (OXY IR/ROXICODONE) 5 MG immediate release tablet Take 1-2 tablets (5-10 mg total) by mouth every 6 (six) hours as needed for moderate pain (pain score 4-6). 56 tablet 0  . pantoprazole (PROTONIX) 40 MG tablet Take 40 mg by mouth 2 (two) times daily.    . predniSONE (DELTASONE) 50 MG tablet 1 p.o. daily x5 5 tablet 0  . Probiotic Product ([place]) CAPS Take 1 capsule by mouth at bedtime.     Marland Kitchen Propylene Glycol (SYSTANE COMPLETE) 0.6 % SOLN Place 1 drop into both eyes 2 (two) times daily.    . ranitidine (ZANTAC) 300 MG tablet Take 300 mg by mouth 2 (two) times daily.     Marland Kitchen topiramate (TOPAMAX) 25 MG tablet TAKE 2 TABLETS (50 MG TOTAL) BY MOUTH AT BEDTIME. 180 tablet 0  .  vitamin B-12 (CYANOCOBALAMIN) 1000 MCG tablet Take 1,000 mcg by mouth daily.    . meloxicam (MOBIC) 15 MG tablet Take 15 mg by mouth daily.    . metoprolol tartrate (LOPRESSOR) 50 MG tablet Take 1 tablet (50 mg total) by mouth 2 (two) times daily. 180 tablet  3  . prochlorperazine (COMPAZINE) 10 MG tablet Take 10 mg by mouth every 6 (six) hours as needed for nausea or vomiting.     . [DISCONTINUED] carbamazepine (TEGRETOL XR) 100 MG 12 hr tablet Take 200 mg by mouth daily.      . [DISCONTINUED] Eszopiclone 3 MG TABS Take 3 mg by mouth at bedtime. Take immediately before bedtime     No current facility-administered medications on file prior to visit.    ROS as in subjective      Objective: BP 130/80   Pulse 86   Temp 98 F (36.7 C) (Oral)   Resp 18   Ht 5\' 3"  (1.6 m)   Wt 226 lb 12.8 oz (102.9 kg)   LMP 07/27/2006   SpO2 97%   BMI 40.18 kg/m   Wt Readings from Last 3 Encounters:  07/25/18 226 lb 12.8 oz (102.9 kg)  07/23/18 227 lb (103 kg)  07/14/18 219 lb (99.3 kg)   General appearance: alert, no distress, WD/WN,  HEENT: normocephalic, sclerae anicteric, left TM with erythema and retraction, right TM pearly, nares patent, no discharge or erythema, pharynx normal Oral cavity: MMM, no lesions Neck: supple, no lymphadenopathy, no thyromegaly, no masses Heart: RRR, normal S1, S2, no murmurs Lungs: CTA bilaterally, no wheezes, rhonchi, or rales No asymmetry, bandages on left anterior knee from recent surgery, compression hose in place Pulses: 2+ symmetric, upper and lower extremities, normal cap refill    Assessment: Encounter Diagnoses  Name Primary?  . SOB (shortness of breath) Yes  . Dry cough   . Recent surgical procedure on lower extremity   . Acute otitis media, unspecified otitis media type       Plan:  Reviewed ED report, CT scan of chest and labs from 2 days ago ED visit.  begin amoxicillin for ear infection Continue incentive spirometry Continue albuterol  2 puffs q4-6 hours prn Advised she ambulate as per ortho instruction and avoid prolonged immobilization Continue compression stockings Discussed symptoms/signs of DVT /PE that would prompt recheck. C/t plan for PT  Lilla was seen today for wheezing.  Diagnoses and all orders for this visit:  SOB (shortness of breath)  Dry cough -     Spirometry with Graph  Recent surgical procedure on lower extremity  Acute otitis media, unspecified otitis media type  Other orders -     amoxicillin (AMOXIL) 875 MG tablet; Take 1 tablet (875 mg total) by mouth 2 (two) times daily.

## 2018-07-30 DIAGNOSIS — M25562 Pain in left knee: Secondary | ICD-10-CM | POA: Diagnosis not present

## 2018-08-01 DIAGNOSIS — M25562 Pain in left knee: Secondary | ICD-10-CM | POA: Diagnosis not present

## 2018-08-04 DIAGNOSIS — M25562 Pain in left knee: Secondary | ICD-10-CM | POA: Diagnosis not present

## 2018-08-06 DIAGNOSIS — M25562 Pain in left knee: Secondary | ICD-10-CM | POA: Diagnosis not present

## 2018-08-08 DIAGNOSIS — M25562 Pain in left knee: Secondary | ICD-10-CM | POA: Diagnosis not present

## 2018-08-11 ENCOUNTER — Encounter: Payer: Medicare HMO | Attending: Surgery | Admitting: Skilled Nursing Facility1

## 2018-08-11 ENCOUNTER — Encounter: Payer: Self-pay | Admitting: Skilled Nursing Facility1

## 2018-08-11 DIAGNOSIS — K219 Gastro-esophageal reflux disease without esophagitis: Secondary | ICD-10-CM | POA: Insufficient documentation

## 2018-08-11 DIAGNOSIS — M25562 Pain in left knee: Secondary | ICD-10-CM | POA: Diagnosis not present

## 2018-08-11 DIAGNOSIS — Z8379 Family history of other diseases of the digestive system: Secondary | ICD-10-CM | POA: Diagnosis not present

## 2018-08-11 DIAGNOSIS — Z888 Allergy status to other drugs, medicaments and biological substances status: Secondary | ICD-10-CM | POA: Insufficient documentation

## 2018-08-11 DIAGNOSIS — Z8249 Family history of ischemic heart disease and other diseases of the circulatory system: Secondary | ICD-10-CM | POA: Diagnosis not present

## 2018-08-11 DIAGNOSIS — Z9049 Acquired absence of other specified parts of digestive tract: Secondary | ICD-10-CM | POA: Insufficient documentation

## 2018-08-11 DIAGNOSIS — Z79899 Other long term (current) drug therapy: Secondary | ICD-10-CM | POA: Diagnosis not present

## 2018-08-11 DIAGNOSIS — Z841 Family history of disorders of kidney and ureter: Secondary | ICD-10-CM | POA: Insufficient documentation

## 2018-08-11 DIAGNOSIS — Z823 Family history of stroke: Secondary | ICD-10-CM | POA: Insufficient documentation

## 2018-08-11 DIAGNOSIS — Z905 Acquired absence of kidney: Secondary | ICD-10-CM | POA: Diagnosis not present

## 2018-08-11 DIAGNOSIS — Z8261 Family history of arthritis: Secondary | ICD-10-CM | POA: Diagnosis not present

## 2018-08-11 DIAGNOSIS — Z818 Family history of other mental and behavioral disorders: Secondary | ICD-10-CM | POA: Diagnosis not present

## 2018-08-11 DIAGNOSIS — Z713 Dietary counseling and surveillance: Secondary | ICD-10-CM | POA: Diagnosis not present

## 2018-08-11 DIAGNOSIS — Z6841 Body Mass Index (BMI) 40.0 and over, adult: Secondary | ICD-10-CM | POA: Diagnosis not present

## 2018-08-11 DIAGNOSIS — E669 Obesity, unspecified: Secondary | ICD-10-CM

## 2018-08-11 NOTE — Progress Notes (Signed)
RYGB Assessment: 3rd SWL Appointment.  Pt needs 6 visits.  Pt has gastroparesis.  Pt states she is getting knee surgery August 19th.  Pt states she does not like vegetables.  Pt arrives having gained about 3 pounds. Pt states she has had trouble breathing from her knee surgery. Pt states she has been back and forth with the doctor due to her breathing and is still working on diagnosing the issue.    Start Wt at NDES: 225.8 Wt: 221 BMI: 40.82   MEDICATIONS: See List   DIETARY INTAKE:  24-hr recall: eating spoonfuls of peanut butter  B ( AM): rice crispie cereal with banana or yogurt Snk ( AM): 1/2 cookie or fruit or yogurt L ( PM): air fried chicken with french fries or bbq baked beans Snk ( PM): banana pudding and chocolate pie  D ( PM): grilled chicken and potatoes or bbq and banana pudding  Snk ( PM):  Beverages: water, gatordae, diet mountain dew  Usual physical activity: Physical Therapy   Diet to Follow: 1500 calories 170 g carbohydrates 112 g protein 42 g fat   Nutritional Diagnosis:  Muniz-3.3 Overweight/obesity related to past poor dietary habits and physical inactivity as evidenced by patient w/ planned RYGB surgery following dietary guidelines for continued weight loss.    Intervention:  Nutrition counseling for upcoming Bariatric Surgery. Goals: -Encouraged to engage in 150 minutes of moderate physical activity including cardiovascular and weight baring weekly -Keep a food journal -Keep working on chewing until applesauce consistency -Aim to get in 64 fluid ounces a day -Keep working on not drinking mountain dew  -Eat until satisfaction  -Try not to drink with meals -Work on trying new vegetables: eat vegetables 4 days a week Given the snack sheet  Teaching Method Utilized:  Visual Auditory Hands on  Barriers to learning/adherence to lifestyle change: none identified   Demonstrated degree of understanding via:  Teach Back   Monitoring/Evaluation:   Dietary intake, exercise,and body weight prn.

## 2018-08-12 DIAGNOSIS — Z5189 Encounter for other specified aftercare: Secondary | ICD-10-CM | POA: Insufficient documentation

## 2018-08-13 ENCOUNTER — Ambulatory Visit (INDEPENDENT_AMBULATORY_CARE_PROVIDER_SITE_OTHER): Payer: Medicare HMO | Admitting: Family Medicine

## 2018-08-13 ENCOUNTER — Encounter: Payer: Self-pay | Admitting: Family Medicine

## 2018-08-13 VITALS — BP 90/66 | HR 84 | Ht 63.0 in | Wt 223.2 lb

## 2018-08-13 DIAGNOSIS — R0609 Other forms of dyspnea: Secondary | ICD-10-CM

## 2018-08-13 DIAGNOSIS — R0600 Dyspnea, unspecified: Secondary | ICD-10-CM

## 2018-08-13 DIAGNOSIS — Z23 Encounter for immunization: Secondary | ICD-10-CM | POA: Diagnosis not present

## 2018-08-13 DIAGNOSIS — M25562 Pain in left knee: Secondary | ICD-10-CM | POA: Diagnosis not present

## 2018-08-13 DIAGNOSIS — D649 Anemia, unspecified: Secondary | ICD-10-CM

## 2018-08-13 NOTE — Patient Instructions (Signed)
We are going to check some labs today to see if there is an underlying reason why you may still be having some shortness of breath. Please try and pay attention to your blood pressure and your pulse when you have symptoms.  It would be helpful to know if pulse is >100 or BP is <74-935 systolic.  It might be that your medications may need to be adjusted. Any anemia, or dehydration may make your symptoms worse. Deconditioning may also be a contributing factor, which takes time to get back up to your norm. Take things slow. Listen to your body and rest when feeling short of breath. If possible, measure your blood pressure and pulse. Make sure to stay very well hydrated.

## 2018-08-13 NOTE — Progress Notes (Signed)
Chief Complaint  Patient presents with  . Shortness of Breath    saw Audelia Acton 07/25/18, did get better and then last week (Th or Fri) started again and her knee pain started again. Had knee surgery 07/14/18.    While at PT today she developed some shortness of breath, and was told to see PCP today.   Had been to ED 8/28--r/o PE, given breathing treatment and steroids, sent home. Had subsegmental atelectasis on CXR. She felt worse, so saw Shane on 8/30. She had left ear plugging, followed by ringing.  She was treated with amoxil for ear infection, and given albuterol inhaler. She was wheezing then--different than the dyspnea she feels now.  Ear plugging resolved, has some residual ringing in the left ear. Hearing is fine.  She hasn't had further wheezing, just the DOE today.  She last used albuterol on Saturday (4 days ago) for shortness of breath. Getting up from toilet to walk to sink can cause her to feelshort of breath.  Sometimes it resolves quickly. It seems to be sporadic.  Not related to when she takes her pain meds (initially thought that could be contributing).  Walking across room at therapy the other day, got SOB, relieved by sitting/resting.  Tired from 2 squats. Today she got lightheaded.  She was at PT, doing standing exercises moving legs. Didn't cause pain, didn't feel like her heart rate was elevated. Hasn't been walking (only in house), usually resolves quickly.  Took pain med Saturday night, Monday night, last night. Has pain at the back of right knee.  Feels better today.  Friday when felt bad at PT, her BP was on the low side, normal pulse Didn't check vitals today at PT when she felt bad. On metoprolol for tachycardia.  She recalls that her dose was increased in hospital in January, and also put on fluid pill at that time. Changed back to prior dosing by cardiology PA/NP afterwards (felt terribly/drained), and diuretic was stopped.  Reviewed records from ER 8/28. EKG,  labs and imaging reviewed. Had normal cardiac cath in 12/2017  Left knee surgery was 8/19 Last CBC showed elevated WBC, mild anemia, post-op) Lab Results  Component Value Date   WBC 15.0 (H) 07/23/2018   HGB 10.9 (L) 07/23/2018   HCT 34.5 (L) 07/23/2018   MCV 91.5 07/23/2018   PLT 485 (H) 07/23/2018     PMH, PSH. SH reviewed  Outpatient Encounter Medications as of 08/13/2018  Medication Sig Note  . acetaminophen (TYLENOL) 500 MG tablet Take 1,000 mg by mouth 3 (three) times daily as needed for moderate pain.   Marland Kitchen albuterol (PROVENTIL HFA;VENTOLIN HFA) 108 (90 Base) MCG/ACT inhaler Inhale 1-2 puffs into the lungs every 6 (six) hours as needed for wheezing or shortness of breath.   . allopurinol (ZYLOPRIM) 300 MG tablet TAKE 1 TABLET (300 MG TOTAL) DAILY BY MOUTH.   Marland Kitchen ARIPiprazole (ABILIFY) 5 MG tablet Take 5 mg by mouth daily.   . Armodafinil 250 MG tablet TAKE 1 TABLET EVERY DAY WITH BREAKFAST (Patient taking differently: Take 250 mg by mouth daily with breakfast. )   . atorvastatin (LIPITOR) 40 MG tablet TAKE 1 TABLET BY MOUTH EVERY DAY (Patient taking differently: Take 40 mg by mouth daily at 6 PM. )   . cetirizine (ZYRTEC) 10 MG tablet Take 10 mg by mouth at bedtime.   . cholecalciferol (VITAMIN D) 1000 units tablet Take 1,000 Units by mouth daily.   . DULoxetine (CYMBALTA) 60 MG capsule Take  60 mg by mouth 2 (two) times daily.    . fluticasone (FLONASE) 50 MCG/ACT nasal spray PLACE 2 SPRAYS INTO BOTH NOSTRILS DAILY AS NEEDED FOR ALLERGIES (Patient taking differently: Place 2 sprays into both nostrils daily as needed for allergies. )   . gabapentin (NEURONTIN) 300 MG capsule Take 1 capsule (300 mg total) by mouth 3 (three) times daily. Gabapentin 300 mg Protocol Take a 300 mg capsule three times a day for two weeks following surgery. Then take a 300 mg capsule two times a day for two weeks.  Then resume a 300 mg capsule once a day at bedtime.   . Melatonin 10 MG TABS Take 10 mg by  mouth at bedtime.   . methocarbamol (ROBAXIN) 500 MG tablet Take 1 tablet (500 mg total) by mouth every 6 (six) hours as needed for muscle spasms.   . metoprolol tartrate (LOPRESSOR) 50 MG tablet Take 1 tablet (50 mg total) by mouth 2 (two) times daily.   . Multiple Vitamins-Minerals (ICAPS AREDS 2) CAPS Take 1 capsule by mouth 2 (two) times daily.   . nortriptyline (PAMELOR) 25 MG capsule Take 75 mg by mouth at bedtime.    . Omega-3 Fatty Acids (FISH OIL) 1200 MG CAPS Take 1,200 mg by mouth daily.   . pantoprazole (PROTONIX) 40 MG tablet Take 40 mg by mouth 2 (two) times daily.   . Probiotic Product ([place]) CAPS Take 1 capsule by mouth at bedtime.    Marland Kitchen Propylene Glycol (SYSTANE COMPLETE) 0.6 % SOLN Place 1 drop into both eyes 2 (two) times daily.   . ranitidine (ZANTAC) 300 MG tablet Take 300 mg by mouth 2 (two) times daily.    Marland Kitchen topiramate (TOPAMAX) 25 MG tablet TAKE 2 TABLETS (50 MG TOTAL) BY MOUTH AT BEDTIME.   . vitamin B-12 (CYANOCOBALAMIN) 1000 MCG tablet Take 1,000 mcg by mouth daily.   Marland Kitchen ALPRAZolam (XANAX) 0.5 MG tablet Take 0.5 mg by mouth 3 (three) times daily as needed for anxiety.   . colchicine 0.6 MG tablet Take 2 tablets by mouth at onset of gout flare. Taken an additional tablet 1 hour later, if needed. May take like this every 3d, if needed (Patient not taking: Reported on 08/13/2018)   . meloxicam (MOBIC) 15 MG tablet Take 15 mg by mouth daily.   . ondansetron (ZOFRAN) 8 MG tablet Take 8 mg by mouth every 8 (eight) hours as needed for nausea.    Marland Kitchen oxyCODONE (OXY IR/ROXICODONE) 5 MG immediate release tablet Take 1-2 tablets (5-10 mg total) by mouth every 6 (six) hours as needed for moderate pain (pain score 4-6). (Patient not taking: Reported on 08/13/2018) 08/13/2018: Last dose last night  . prochlorperazine (COMPAZINE) 10 MG tablet Take 10 mg by mouth every 6 (six) hours as needed for nausea or vomiting.    . [DISCONTINUED] amoxicillin (AMOXIL) 875 MG tablet Take 1  tablet (875 mg total) by mouth 2 (two) times daily.   . [DISCONTINUED] carbamazepine (TEGRETOL XR) 100 MG 12 hr tablet Take 200 mg by mouth daily.     . [DISCONTINUED] Eszopiclone 3 MG TABS Take 3 mg by mouth at bedtime. Take immediately before bedtime   . [DISCONTINUED] Menthol-Camphor (SOMBRA NATURAL PAIN RELIEVING EX) Apply 1 application topically daily as needed (back pain).   . [DISCONTINUED] predniSONE (DELTASONE) 50 MG tablet 1 p.o. daily x5    No facility-administered encounter medications on file as of 08/13/2018.    Allergies  Allergen Reactions  .  Erythromycin Nausea Only    Abdominal pain  . Tramadol Itching  . Dilaudid [Hydromorphone Hcl] Itching    ROS: no fever, chills, headache.  LH today, DOE today, and sporadically with exertion.  No nausea, vomiting, diarrhea, urinary complaints, bleeding, bruising, rash.  Pain at left knee, posteriorly, intermittently (feels okay now).  No chest pain, palpitations. See HPI.  PHYSICAL EXAM:  BP 90/66   Pulse 84   Ht '5\' 3"'  (1.6 m)   Wt 223 lb 3.2 oz (101.2 kg)   LMP 07/27/2006   SpO2 98%   BMI 39.54 kg/m   Pleasant female, alert, oriented.  Has dry mouth, dry when talking, taking sips of water during visit. She is speaking easily, in no distress HEENT: PERRL, EOMI, conjunctiva and sclera are clear.  OP is notable for dry lips and mucosa.  No lesions TM slight retracted on the right, normal on the left Neck: No lymphadenopathy or mass Heart: regular rate and rhythm, no murmur Lungs: clear bilaterally, no wheezes, rales, ronchi, good air movement Abdomen: obese, soft, nontender, no organomegaly or mass Extremities: no edema, normal pulses. Left knee--effusion, warmth. WHSS both knees Psych: normal mood, affect, hygiene and grooming Neuro: alert and oriented, cranial nerves intact, normal strength.   ASSESSMENT/PLAN:  Dyspnea on exertion - sporadic. nl cardiac eval in past. r/o anemia. Poss related to low BP vs high pulse, to  monitor when symptomatic. Push fluids. - Plan: Comprehensive metabolic panel, CBC with Differential/Platelet  Need for influenza vaccination - Plan: Flu Vaccine QUAD 6+ mos PF IM (Fluarix Quad PF)  Anemia, unspecified type - s/p  knee surgery last month; some of sx could be from deconditioning. r/o worsening anemia - Plan: CBC with Differential/Platelet  Differential diagnosis reviewed in great detail. Lungs are clear. Doesn't sound cardiac. Deconditioning may be a factor. Continue PT.  Check CBC, c-met  Sees Dr. Wynelle Link next week.  Call on cell with results tomorrow, as she will not be home.  We are going to check some labs today to see if there is an underlying reason why you may still be having some shortness of breath. Please try and pay attention to your blood pressure and your pulse when you have symptoms.  It would be helpful to know if pulse is >100 or BP is <68-115 systolic.  It might be that your medications may need to be adjusted. Any anemia, or dehydration may make your symptoms worse. Deconditioning may also be a contributing factor, which takes time to get back up to your norm. Take things slow. Listen to your body and rest when feeling short of breath. If possible, measure your blood pressure and pulse. Make sure to stay very well hydrated.

## 2018-08-14 DIAGNOSIS — E611 Iron deficiency: Secondary | ICD-10-CM | POA: Diagnosis not present

## 2018-08-14 DIAGNOSIS — R5383 Other fatigue: Secondary | ICD-10-CM | POA: Diagnosis not present

## 2018-08-14 DIAGNOSIS — R112 Nausea with vomiting, unspecified: Secondary | ICD-10-CM | POA: Diagnosis not present

## 2018-08-14 DIAGNOSIS — R5381 Other malaise: Secondary | ICD-10-CM | POA: Diagnosis not present

## 2018-08-14 DIAGNOSIS — K210 Gastro-esophageal reflux disease with esophagitis: Secondary | ICD-10-CM | POA: Diagnosis not present

## 2018-08-14 DIAGNOSIS — R350 Frequency of micturition: Secondary | ICD-10-CM | POA: Diagnosis not present

## 2018-08-14 LAB — COMPREHENSIVE METABOLIC PANEL
ALT: 23 IU/L (ref 0–32)
AST: 19 IU/L (ref 0–40)
Albumin/Globulin Ratio: 1.3 (ref 1.2–2.2)
Albumin: 4 g/dL (ref 3.6–4.8)
Alkaline Phosphatase: 131 IU/L — ABNORMAL HIGH (ref 39–117)
BUN/Creatinine Ratio: 27 (ref 12–28)
BUN: 26 mg/dL (ref 8–27)
Bilirubin Total: 0.2 mg/dL (ref 0.0–1.2)
CO2: 25 mmol/L (ref 20–29)
Calcium: 9.2 mg/dL (ref 8.7–10.3)
Chloride: 103 mmol/L (ref 96–106)
Creatinine, Ser: 0.96 mg/dL (ref 0.57–1.00)
GFR calc Af Amer: 74 mL/min/{1.73_m2} (ref >59)
GFR calc non Af Amer: 64 mL/min/{1.73_m2} (ref >59)
Globulin, Total: 3 g/dL (ref 1.5–4.5)
Glucose: 97 mg/dL (ref 65–99)
Potassium: 4.3 mmol/L (ref 3.5–5.2)
Sodium: 142 mmol/L (ref 134–144)
Total Protein: 7 g/dL (ref 6.0–8.5)

## 2018-08-14 LAB — CBC WITH DIFFERENTIAL/PLATELET
Basophils Absolute: 0.1 10*3/uL (ref 0.0–0.2)
Basos: 1 %
EOS (ABSOLUTE): 0.4 10*3/uL (ref 0.0–0.4)
Eos: 4 %
Hematocrit: 35.7 % (ref 34.0–46.6)
Hemoglobin: 11.6 g/dL (ref 11.1–15.9)
Immature Grans (Abs): 0.1 10*3/uL (ref 0.0–0.1)
Immature Granulocytes: 1 %
Lymphocytes Absolute: 2.5 10*3/uL (ref 0.7–3.1)
Lymphs: 27 %
MCH: 28.2 pg (ref 26.6–33.0)
MCHC: 32.5 g/dL (ref 31.5–35.7)
MCV: 87 fL (ref 79–97)
Monocytes Absolute: 0.9 10*3/uL (ref 0.1–0.9)
Monocytes: 9 %
Neutrophils Absolute: 5.6 10*3/uL (ref 1.4–7.0)
Neutrophils: 58 %
Platelets: 352 10*3/uL (ref 150–450)
RBC: 4.11 x10E6/uL (ref 3.77–5.28)
RDW: 13.9 % (ref 12.3–15.4)
WBC: 9.5 10*3/uL (ref 3.4–10.8)

## 2018-08-16 DIAGNOSIS — G4733 Obstructive sleep apnea (adult) (pediatric): Secondary | ICD-10-CM | POA: Diagnosis not present

## 2018-08-18 ENCOUNTER — Telehealth: Payer: Self-pay

## 2018-08-18 DIAGNOSIS — M25562 Pain in left knee: Secondary | ICD-10-CM | POA: Diagnosis not present

## 2018-08-18 NOTE — Telephone Encounter (Signed)
Pt stated she is not doing any better and is still having problems breathing. She also wants to inform you that she went to [facility] last Thursday and had some blood work done. Pt wants to know what is the next step. Please advise.   Pt would like to be reached on her cell phone @ [phone]

## 2018-08-18 NOTE — Telephone Encounter (Signed)
Patient advised, she will let me know if she needs any help with an appt.

## 2018-08-18 NOTE — Telephone Encounter (Signed)
Advise pt. Chart reviewed.  Urine was very concentrated--I wonder how much dehydration is playing a part, as she also appeared dehydrated at her visit here.  Labs all looked okay overall.  If she is continuing to use her incentive spirometer and trying to get exercise, but is limited by her shortness of breath, then maybe she should f/u with cardiologist.  Lungs were completely looked at with CT angio with recent ER visit. She previously has seen Dr. Johnsie Cancel (within the year). See if she needs [place] to schedule or if she wants to call to set up.

## 2018-08-19 DIAGNOSIS — Z471 Aftercare following joint replacement surgery: Secondary | ICD-10-CM | POA: Diagnosis not present

## 2018-08-19 DIAGNOSIS — M1712 Unilateral primary osteoarthritis, left knee: Secondary | ICD-10-CM | POA: Diagnosis not present

## 2018-08-19 DIAGNOSIS — Z96652 Presence of left artificial knee joint: Secondary | ICD-10-CM | POA: Diagnosis not present

## 2018-08-20 DIAGNOSIS — M25562 Pain in left knee: Secondary | ICD-10-CM | POA: Diagnosis not present

## 2018-08-25 DIAGNOSIS — M25562 Pain in left knee: Secondary | ICD-10-CM | POA: Diagnosis not present

## 2018-08-26 DIAGNOSIS — H04123 Dry eye syndrome of bilateral lacrimal glands: Secondary | ICD-10-CM | POA: Diagnosis not present

## 2018-08-26 DIAGNOSIS — H2513 Age-related nuclear cataract, bilateral: Secondary | ICD-10-CM | POA: Diagnosis not present

## 2018-08-26 DIAGNOSIS — H353131 Nonexudative age-related macular degeneration, bilateral, early dry stage: Secondary | ICD-10-CM | POA: Diagnosis not present

## 2018-08-26 DIAGNOSIS — H33311 Horseshoe tear of retina without detachment, right eye: Secondary | ICD-10-CM | POA: Diagnosis not present

## 2018-08-27 ENCOUNTER — Other Ambulatory Visit: Payer: Self-pay | Admitting: Rheumatology

## 2018-08-27 ENCOUNTER — Other Ambulatory Visit (INDEPENDENT_AMBULATORY_CARE_PROVIDER_SITE_OTHER): Payer: Medicare HMO

## 2018-08-27 ENCOUNTER — Ambulatory Visit: Payer: Medicare HMO | Admitting: Primary Care

## 2018-08-27 ENCOUNTER — Encounter: Payer: Self-pay | Admitting: Primary Care

## 2018-08-27 VITALS — BP 112/72 | HR 88 | Ht 62.0 in | Wt 220.0 lb

## 2018-08-27 DIAGNOSIS — R0600 Dyspnea, unspecified: Secondary | ICD-10-CM

## 2018-08-27 DIAGNOSIS — J984 Other disorders of lung: Secondary | ICD-10-CM | POA: Diagnosis not present

## 2018-08-27 DIAGNOSIS — E669 Obesity, unspecified: Secondary | ICD-10-CM

## 2018-08-27 DIAGNOSIS — M25562 Pain in left knee: Secondary | ICD-10-CM | POA: Diagnosis not present

## 2018-08-27 LAB — CBC WITH DIFFERENTIAL/PLATELET
Basophils Absolute: 0.1 10*3/uL (ref 0.0–0.1)
Basophils Relative: 1 % (ref 0.0–3.0)
Eosinophils Absolute: 0.3 10*3/uL (ref 0.0–0.7)
Eosinophils Relative: 2.7 % (ref 0.0–5.0)
HCT: 38.2 % (ref 36.0–46.0)
Hemoglobin: 12.3 g/dL (ref 12.0–15.0)
Lymphocytes Relative: 25.2 % (ref 12.0–46.0)
Lymphs Abs: 2.6 10*3/uL (ref 0.7–4.0)
MCHC: 32.3 g/dL (ref 30.0–36.0)
MCV: 88.3 fl (ref 78.0–100.0)
Monocytes Absolute: 1 10*3/uL (ref 0.1–1.0)
Monocytes Relative: 9.7 % (ref 3.0–12.0)
Neutro Abs: 6.3 10*3/uL (ref 1.4–7.7)
Neutrophils Relative %: 61.4 % (ref 43.0–77.0)
Platelets: 397 10*3/uL (ref 150.0–400.0)
RBC: 4.32 Mil/uL (ref 3.87–5.11)
RDW: 16.1 % — ABNORMAL HIGH (ref 11.5–15.5)
WBC: 10.3 10*3/uL (ref 4.0–10.5)

## 2018-08-27 LAB — BRAIN NATRIURETIC PEPTIDE: Pro B Natriuretic peptide (BNP): 17 pg/mL (ref 0.0–100.0)

## 2018-08-27 LAB — POCT EXHALED NITRIC OXIDE: FeNO level (ppb): 48

## 2018-08-27 MED ORDER — BECLOMETHASONE DIPROP HFA 40 MCG/ACT IN AERB: 1.0000 | INHALATION_SPRAY | Freq: Two times a day (BID) | RESPIRATORY_TRACT | 0 refills | Status: DC

## 2018-08-27 MED ORDER — PREDNISONE 10 MG PO TABS: ORAL_TABLET | ORAL | 0 refills | Status: DC

## 2018-08-27 NOTE — Progress Notes (Signed)
'@Patient'  ID: [name], female    DOB: [birth year], 62 y.o.   MRN: [id number]  Chief Complaint  Patient presents with  . Acute Visit    SOB at all times     Referring provider: Rita Ohara, MD  HPI: 62 year old female, never smoked. PMH OSA on CPAP, dyspnea, restrictive lung disease d/t obesity, GERD. Patient of Dr. Lenna Gilford, last seen 1/31.19. CXR normal. Spirometry showed restriction, no obstruction. Dyspnea felt to multifactorial d/t mild pulm restriction from obesity, sleep apnea, deconditioning. New CPAP machine in 2014. Unable to get download in the past, patient needs to bring in SD card [facility]   08/27/2018 Patient presents today with complaints of sob x5 weeks, started off as wheezing. Dyspnea comes and goes. Symptoms are unpredictable. She had left knee replacement Aug 19th. She was sent to ED on 08/28 by orthopedic doctor for sob/wheezing. CT negative PE. She was placed on steroid which helped with her wheezing but still would become short of breath with light activity. Breathing symptoms have been interfering with her physical therapy. Requires rescue inhaler 3 times a week, unsure if it helps. No history of asthma as child but her sister has asthma. She has been following with a nutritionist and has met with a bariatric surgeon.    Allergies  Allergen Reactions  . Erythromycin Nausea Only    Abdominal pain  . Tramadol Itching  . Dilaudid [Hydromorphone Hcl] Itching    Immunization History  Administered Date(s) Administered  . Influenza Split 09/17/2011, 09/03/2012, 09/04/2013  . Influenza,inj,Quad PF,6+ Mos 08/16/2014, 07/27/2015, 07/31/2017, 08/13/2018  . Influenza-Unspecified 10/04/2016, 07/31/2017, 09/23/2017  . Tdap 01/16/2011, 06/20/2017    Past Medical History:  Diagnosis Date  . Anemia    previously followed by Dr. Jamse Arn for anemia and elevated platelets  . Anxiety   . C. difficile colitis 10/01/12   treated by [facility]  . Chronic fatigue syndrome   .  DDD (degenerative disc disease), lumbar 08/19/14   and facet arthroplasty & left lumbar radiculopathy (Dr.Ramos)  . Depression   . Dyssynergia    dyssynergenic defecation, contributing to fecal incontinence.  . Edema   . Fibromyalgia   . Gastroparesis    followed at [facility]  . GERD (gastroesophageal reflux disease)   . Hyperlipidemia   . Kidney stone   . Lumbar radiculopathy   . Migraine   . Neuropathy   . Obstructive sleep apnea    Does  wear  CPAP  . Paresthesia    Dr. Everette Rank at [facility]  . Pelvic floor dysfunction    pelvic floor dyssynergy  . Plantar fasciitis 02/2011   R foot  . Pneumonia    2012  . PONV (postoperative nausea and vomiting)    pt states has gastroparesis has difficulty taking antibiotics and narcotics has severe nausea and vomiting   . Restless leg syndrome   . S/P endometrial ablation 08/09/2006   Novasure Ablation  . S/P epidural steroid injection 09/20/14   Dr.Ramos  . Tremor    Dr. Everette Rank  . Urinary frequency   . Urinary incontinence     Tobacco History: Social History   Tobacco Use  Smoking Status Never Smoker  Smokeless Tobacco Never Used   Counseling given: Not Answered   Outpatient Medications Prior to Visit  Medication Sig Dispense Refill  . acetaminophen (TYLENOL) 500 MG tablet Take 1,000 mg by mouth 3 (three) times daily as needed for moderate pain.    Marland Kitchen albuterol (  PROVENTIL HFA;VENTOLIN HFA) 108 (90 Base) MCG/ACT inhaler Inhale 1-2 puffs into the lungs every 6 (six) hours as needed for wheezing or shortness of breath. 1 Inhaler 0  . allopurinol (ZYLOPRIM) 300 MG tablet TAKE 1 TABLET (300 MG TOTAL) DAILY BY MOUTH. 90 tablet 0  . ALPRAZolam (XANAX) 0.5 MG tablet Take 0.5 mg by mouth 3 (three) times daily as needed for anxiety.  0  . ARIPiprazole (ABILIFY) 5 MG tablet Take 5 mg by mouth daily.  5  . Armodafinil 250 MG tablet TAKE 1 TABLET EVERY DAY WITH BREAKFAST (Patient taking differently: Take 250 mg by mouth daily with  breakfast. ) 90 tablet 0  . atorvastatin (LIPITOR) 40 MG tablet TAKE 1 TABLET BY MOUTH EVERY DAY (Patient taking differently: Take 40 mg by mouth daily at 6 PM. ) 90 tablet 0  . cetirizine (ZYRTEC) 10 MG tablet Take 10 mg by mouth at bedtime.    . cholecalciferol (VITAMIN D) 1000 units tablet Take 1,000 Units by mouth daily.    . colchicine 0.6 MG tablet Take 2 tablets by mouth at onset of gout flare. Taken an additional tablet 1 hour later, if needed. May take like this every 3d, if needed 30 tablet 0  . DULoxetine (CYMBALTA) 60 MG capsule Take 60 mg by mouth 2 (two) times daily.     . fluticasone (FLONASE) 50 MCG/ACT nasal spray PLACE 2 SPRAYS INTO BOTH NOSTRILS DAILY AS NEEDED FOR ALLERGIES (Patient taking differently: Place 2 sprays into both nostrils daily as needed for allergies. ) 16 g 1  . gabapentin (NEURONTIN) 300 MG capsule Take 1 capsule (300 mg total) by mouth 3 (three) times daily. Gabapentin 300 mg Protocol Take a 300 mg capsule three times a day for two weeks following surgery. Then take a 300 mg capsule two times a day for two weeks.  Then resume a 300 mg capsule once a day at bedtime. 84 capsule 0  . Melatonin 10 MG TABS Take 10 mg by mouth at bedtime.    . meloxicam (MOBIC) 15 MG tablet Take 15 mg by mouth daily.    . methocarbamol (ROBAXIN) 500 MG tablet Take 1 tablet (500 mg total) by mouth every 6 (six) hours as needed for muscle spasms. 40 tablet 0  . Multiple Vitamins-Minerals (ICAPS AREDS 2) CAPS Take 1 capsule by mouth 2 (two) times daily.    . nortriptyline (PAMELOR) 25 MG capsule Take 75 mg by mouth at bedtime.     . Omega-3 Fatty Acids (FISH OIL) 1200 MG CAPS Take 1,200 mg by mouth daily.    . ondansetron (ZOFRAN) 8 MG tablet Take 8 mg by mouth every 8 (eight) hours as needed for nausea.   0  . oxyCODONE (OXY IR/ROXICODONE) 5 MG immediate release tablet Take 1-2 tablets (5-10 mg total) by mouth every 6 (six) hours as needed for moderate pain (pain score 4-6). 56 tablet  0  . pantoprazole (PROTONIX) 40 MG tablet Take 40 mg by mouth 2 (two) times daily.    . Probiotic Product ([place]) CAPS Take 1 capsule by mouth at bedtime.     . prochlorperazine (COMPAZINE) 10 MG tablet Take 10 mg by mouth every 6 (six) hours as needed for nausea or vomiting.     Marland Kitchen Propylene Glycol (SYSTANE COMPLETE) 0.6 % SOLN Place 1 drop into both eyes 2 (two) times daily.    . ranitidine (ZANTAC) 300 MG tablet Take 300 mg by mouth 2 (two) times daily.     Marland Kitchen  topiramate (TOPAMAX) 25 MG tablet TAKE 2 TABLETS (50 MG TOTAL) BY MOUTH AT BEDTIME. 180 tablet 0  . vitamin B-12 (CYANOCOBALAMIN) 1000 MCG tablet Take 1,000 mcg by mouth daily.    . metoprolol tartrate (LOPRESSOR) 50 MG tablet Take 1 tablet (50 mg total) by mouth 2 (two) times daily. 180 tablet 3   No facility-administered medications prior to visit.     Review of Systems  Review of Systems  Constitutional: Positive for fatigue.  HENT: Negative.   Respiratory: Positive for shortness of breath and wheezing. Negative for cough.   Cardiovascular: Negative.     Physical Exam  BP 112/72 (BP Location: Left Arm, Patient Position: Sitting, Cuff Size: Normal)   Pulse 88   Ht '5\' 2"'  (1.575 m)   Wt 220 lb (99.8 kg)   LMP 07/27/2006   SpO2 93%   BMI 40.24 kg/m  Physical Exam  Constitutional: She is oriented to person, place, and time. She appears well-developed and well-nourished. No distress.  Overweight   HENT:  Head: Normocephalic and atraumatic.  Eyes: Pupils are equal, round, and reactive to light. EOM are normal.  Neck: Normal range of motion. Neck supple.  Cardiovascular: Normal rate and regular rhythm.  No edema   Pulmonary/Chest: No respiratory distress. She has no wheezes.  CTA. No wheeze or resp distress. O2 sat on ambulation 96%  Musculoskeletal: Normal range of motion.  Neurological: She is alert and oriented to person, place, and time.  Skin: Skin is warm and dry.  Psychiatric: Her behavior is  normal. Judgment and thought content normal.  Mildly anxious      Lab Results:  CBC    Component Value Date/Time   WBC 9.5 08/13/2018 1633   WBC 15.0 (H) 07/23/2018 0801   RBC 4.11 08/13/2018 1633   RBC 3.77 (L) 07/23/2018 0801   HGB 11.6 08/13/2018 1633   HGB 11.7 05/02/2011 1412   HCT 35.7 08/13/2018 1633   HCT 37.1 05/02/2011 1412   PLT 352 08/13/2018 1633   MCV 87 08/13/2018 1633   MCV 94.4 05/02/2011 1412   MCH 28.2 08/13/2018 1633   MCH 28.9 07/23/2018 0801   MCHC 32.5 08/13/2018 1633   MCHC 31.6 07/23/2018 0801   RDW 13.9 08/13/2018 1633   RDW 18.3 (H) 05/02/2011 1412   LYMPHSABS 2.5 08/13/2018 1633   LYMPHSABS 2.4 05/02/2011 1412   MONOABS 1.2 (H) 07/23/2018 0801   MONOABS 0.8 05/02/2011 1412   EOSABS 0.4 08/13/2018 1633   BASOSABS 0.1 08/13/2018 1633   BASOSABS 0.1 05/02/2011 1412    BMET    Component Value Date/Time   NA 142 08/13/2018 1633   K 4.3 08/13/2018 1633   CL 103 08/13/2018 1633   CO2 25 08/13/2018 1633   GLUCOSE 97 08/13/2018 1633   GLUCOSE 113 (H) 07/23/2018 0801   BUN 26 08/13/2018 1633   CREATININE 0.96 08/13/2018 1633   CREATININE 0.74 10/03/2017 1020   CALCIUM 9.2 08/13/2018 1633   GFRNONAA 64 08/13/2018 1633   GFRAA 74 08/13/2018 1633    BNP    Component Value Date/Time   BNP 45.3 11/27/2015 1948    ProBNP    Component Value Date/Time   PROBNP 28 12/18/2017 1020   PROBNP 217.9 (H) 04/17/2011 1209    Imaging: No results found.   Assessment & Plan:   Obesity - Following with nutrition - Has met with bariatric surgeon   Restrictive lung disease secondary to obesity - Spirometry showed mild restriction, possible underlying asthma -  Ambulatory O2 sat maintained 96% RA, patient became easily fatigued  - FENO elevated at 48 today - Checking CBC with diff, BNP, Ddimer and IgE - Plan prednisone taper  - Trial Qvar 40 1 puff twice daily - Needs full PFTs with mechalcholine challenge - Changing provider to Dr. Halford Chessman,  has follow-up scheduled for Oct 30th     Martyn Ehrich, NP 08/27/2018

## 2018-08-27 NOTE — Assessment & Plan Note (Deleted)
-   Spirometry showed mild restriction, possible underlying asthma - Ambulatory O2 sat maintained 96% RA, patient became easily fatigued  - FENO elevated at 48 today - Checking CBC with diff, BNP, Ddimer and IgE - Plan prednisone taper  - Trial Qvar 40 1 puff twice daily - Needs full PFTs with mechalcholine challenge - Changing provider to Dr. Halford Chessman, has follow-up scheduled for Oct 30th

## 2018-08-27 NOTE — Progress Notes (Signed)
[facility] Note  08/28/2018     CHIEF COMPLAINT Patient presents for Retina Evaluation   HISTORY OF PRESENT ILLNESS: [name] is a 62 y.o. female who presents to the clinic today for:   HPI    Retina Evaluation    In both eyes.  This started 1 week ago.  Associated Symptoms Floaters.  Negative for Flashes, Blind Spot, Photophobia, Scalp Tenderness, Fever, Weight Loss, Jaw Claudication, Glare, Pain, Distortion, Redness, Trauma, Shoulder/Hip pain and Fatigue.  Context:  distance vision, mid-range vision and near vision.  Treatments tried include artificial tears.  I, the attending physician,  performed the HPI with the patient and updated documentation appropriately.          Comments    Referral of Dr. Posey Pronto for retina eval. Patient states last weekend she noticed floaters OD, on Sunday (08/24/18) floaters worse, as time went floaters decreased.  Floaters are now occasional OD. Patient is using Systane gtt's and taking vit's       Last edited by Bernarda Caffey, MD on 08/28/2018 11:15 AM. (History)    Pt states she sees Dr. Posey Pronto on a routine basis; Pt states she has been told that she has "the beginnings of macular degeneration"; Pt states Dr. Posey Pronto explained to her that "her jelly is separating from right and it could have torn my retina"; Pt endorses floaters OD;   Referring physician: Virgina Evener, [place], [place] [address]  HISTORICAL INFORMATION:   Selected notes from the MEDICAL RECORD NUMBER Referred by Dr. Virgina Evener for concern of PVD OD with retinal tears LEE: 10.01.19 Sammie Bench) [BCVA: OD: 20/40++ OS: 20/25] Ocular Hx-non-exu ARMD OU, cataract OU, DES OU PMH-anemia, anxiety, depression, edema, fibromyalgia, HLD, migraines, neuropathy    CURRENT MEDICATIONS: Current Outpatient Medications (Ophthalmic Drugs)  Medication Sig  . prednisoLONE acetate (PRED FORTE) 1 % ophthalmic suspension Place 1 drop  into the right eye 4 (four) times daily for 7 days.  Marland Kitchen Propylene Glycol (SYSTANE COMPLETE) 0.6 % SOLN Place 1 drop into both eyes 2 (two) times daily.   No current facility-administered medications for this visit.  (Ophthalmic Drugs)   Current Outpatient Medications (Other)  Medication Sig  . acetaminophen (TYLENOL) 500 MG tablet Take 1,000 mg by mouth 3 (three) times daily as needed for moderate pain.  Marland Kitchen albuterol (PROVENTIL HFA;VENTOLIN HFA) 108 (90 Base) MCG/ACT inhaler Inhale 1-2 puffs into the lungs every 6 (six) hours as needed for wheezing or shortness of breath.  . allopurinol (ZYLOPRIM) 300 MG tablet TAKE 1 TABLET (300 MG TOTAL) DAILY BY MOUTH.  Marland Kitchen ALPRAZolam (XANAX) 0.5 MG tablet Take 0.5 mg by mouth 3 (three) times daily as needed for anxiety.  . ARIPiprazole (ABILIFY) 5 MG tablet Take 5 mg by mouth daily.  . Armodafinil 250 MG tablet TAKE 1 TABLET EVERY DAY WITH BREAKFAST  . atorvastatin (LIPITOR) 40 MG tablet TAKE 1 TABLET BY MOUTH EVERY DAY (Patient taking differently: Take 40 mg by mouth daily at 6 PM. )  . beclomethasone (QVAR REDIHALER) 40 MCG/ACT inhaler Inhale 1 puff into the lungs 2 (two) times daily.  . cetirizine (ZYRTEC) 10 MG tablet Take 10 mg by mouth at bedtime.  . cholecalciferol (VITAMIN D) 1000 units tablet Take 1,000 Units by mouth daily.  . colchicine 0.6 MG tablet Take 2 tablets by mouth at onset of gout flare. Taken an additional tablet 1 hour later, if needed. May take like  this every 3d, if needed  . DULoxetine (CYMBALTA) 60 MG capsule Take 60 mg by mouth 2 (two) times daily.   . fluticasone (FLONASE) 50 MCG/ACT nasal spray PLACE 2 SPRAYS INTO BOTH NOSTRILS DAILY AS NEEDED FOR ALLERGIES (Patient taking differently: Place 2 sprays into both nostrils daily as needed for allergies. )  . gabapentin (NEURONTIN) 300 MG capsule Take 1 capsule (300 mg total) by mouth 3 (three) times daily. Gabapentin 300 mg Protocol Take a 300 mg capsule three times a day for two weeks  following surgery. Then take a 300 mg capsule two times a day for two weeks.  Then resume a 300 mg capsule once a day at bedtime.  . Melatonin 10 MG TABS Take 10 mg by mouth at bedtime.  . meloxicam (MOBIC) 15 MG tablet Take 15 mg by mouth daily.  . methocarbamol (ROBAXIN) 500 MG tablet Take 1 tablet (500 mg total) by mouth every 6 (six) hours as needed for muscle spasms.  . metoprolol tartrate (LOPRESSOR) 50 MG tablet Take 1 tablet (50 mg total) by mouth 2 (two) times daily.  . Multiple Vitamins-Minerals (ICAPS AREDS 2) CAPS Take 1 capsule by mouth 2 (two) times daily.  . nortriptyline (PAMELOR) 25 MG capsule Take 75 mg by mouth at bedtime.   . Omega-3 Fatty Acids (FISH OIL) 1200 MG CAPS Take 1,200 mg by mouth daily.  . ondansetron (ZOFRAN) 8 MG tablet Take 8 mg by mouth every 8 (eight) hours as needed for nausea.   Marland Kitchen oxyCODONE (OXY IR/ROXICODONE) 5 MG immediate release tablet Take 1-2 tablets (5-10 mg total) by mouth every 6 (six) hours as needed for moderate pain (pain score 4-6). (Patient not taking: Reported on 08/28/2018)  . pantoprazole (PROTONIX) 40 MG tablet Take 40 mg by mouth 2 (two) times daily.  . predniSONE (DELTASONE) 10 MG tablet Take 4 tabs po daily x 2 days; then 3 tabs for 2 days; then 2 tabs for 2 days; then 1 tab for 2 days  . Probiotic Product ([place]) CAPS Take 1 capsule by mouth at bedtime.   . prochlorperazine (COMPAZINE) 10 MG tablet Take 10 mg by mouth every 6 (six) hours as needed for nausea or vomiting.   . ranitidine (ZANTAC) 300 MG tablet Take 300 mg by mouth 2 (two) times daily.   Marland Kitchen topiramate (TOPAMAX) 25 MG tablet TAKE 2 TABLETS (50 MG TOTAL) BY MOUTH AT BEDTIME.  . vitamin B-12 (CYANOCOBALAMIN) 1000 MCG tablet Take 1,000 mcg by mouth daily.   No current facility-administered medications for this visit.  (Other)      REVIEW OF SYSTEMS: ROS    Positive for: Eyes   Negative for: Constitutional, Gastrointestinal, Neurological, Skin,  Genitourinary, Musculoskeletal, HENT, Endocrine, Cardiovascular, Respiratory, Psychiatric, Allergic/Imm, Heme/Lymph   Last edited by Zenovia Jordan, LPN on 72/0/9470  9:62 AM. (History)       ALLERGIES Allergies  Allergen Reactions  . Erythromycin Nausea Only    Abdominal pain  . Tramadol Itching  . Dilaudid [Hydromorphone Hcl] Itching    PAST MEDICAL HISTORY Past Medical History:  Diagnosis Date  . Anemia    previously followed by Dr. Jamse Arn for anemia and elevated platelets  . Anxiety   . C. difficile colitis 10/01/12   treated by [facility]  . Chronic fatigue syndrome   . DDD (degenerative disc disease), lumbar 08/19/14   and facet arthroplasty & left lumbar radiculopathy (Dr.Ramos)  . Depression   . Dyssynergia    dyssynergenic defecation, contributing  to fecal incontinence.  . Edema   . Fibromyalgia   . Gastroparesis    followed at [facility]  . GERD (gastroesophageal reflux disease)   . Hyperlipidemia   . Kidney stone   . Lumbar radiculopathy   . Migraine   . Neuropathy   . Obstructive sleep apnea    Does  wear  CPAP  . Paresthesia    Dr. Everette Rank at [facility]  . Pelvic floor dysfunction    pelvic floor dyssynergy  . Plantar fasciitis 02/2011   R foot  . Pneumonia    2012  . PONV (postoperative nausea and vomiting)    pt states has gastroparesis has difficulty taking antibiotics and narcotics has severe nausea and vomiting   . Restless leg syndrome   . S/P endometrial ablation 08/09/2006   Novasure Ablation  . S/P epidural steroid injection 09/20/14   Dr.Ramos  . Tremor    Dr. Everette Rank  . Urinary frequency   . Urinary incontinence    Past Surgical History:  Procedure Laterality Date  . CHOLECYSTECTOMY  9/05  . ENDOMETRIAL ABLATION  08/09/2006   Dr. Valentina Shaggy Ablation  . FACET JOINT INJECTION  04/17/2017   Left L4-5 and L5-S1  . KNEE ARTHROPLASTY    . KNEE SURGERY  1999   R knee, Dr. Eddie Dibbles, torn cartilage  . RIGHT/LEFT HEART CATH AND  CORONARY ANGIOGRAPHY N/A 01/01/2018   Procedure: RIGHT/LEFT HEART CATH AND CORONARY ANGIOGRAPHY;  Surgeon: Sherren Mocha, MD;  Location: [facility];  Service: Cardiovascular;  Laterality: N/A;  . TONSILLECTOMY  1968  . TONSILLECTOMY    . TOTAL KNEE ARTHROPLASTY Right 09/05/2015   Procedure: RIGHT TOTAL KNEE ARTHROPLASTY;  Surgeon: Gaynelle Arabian, MD;  Location: [facility];  Service: Orthopedics;  Laterality: Right;  . TOTAL KNEE ARTHROPLASTY Left 07/14/2018   Procedure: LEFT TOTAL KNEE ARTHROPLASTY;  Surgeon: Gaynelle Arabian, MD;  Location: [facility];  Service: Orthopedics;  Laterality: Left;  . ULTRASOUND GUIDANCE FOR VASCULAR ACCESS  01/01/2018   Procedure: Ultrasound Guidance For Vascular Access;  Surgeon: Sherren Mocha, MD;  Location: [facility];  Service: Cardiovascular;;    FAMILY HISTORY Family History  Problem Relation Age of Onset  . Allergies Mother   . Hypertension Mother   . Heart disease Mother        possible valve problem - leaking valve  . Macular degeneration Mother   . Heart disease Father        pacemaker, CHF  . Hypertension Father   . Diabetes Father        borderline  . Stroke Father 68  . Kidney disease Father   . Asthma Sister   . Irritable bowel syndrome Sister   . Allergies Sister   . Heart disease Paternal Grandmother   . Heart disease Paternal Grandfather   . Cancer Maternal Aunt        leukemia  . Cancer Maternal Aunt   . Colon cancer Maternal Aunt        late 47's  . CAD Neg Hx     SOCIAL HISTORY Social History   Tobacco Use  . Smoking status: Never Smoker  . Smokeless tobacco: Never Used  Substance Use Topics  . Alcohol use: No    Alcohol/week: 0.0 standard drinks  . Drug use: No         OPHTHALMIC EXAM:  Base Eye Exam    Visual Acuity (Snellen - Linear)      Right Left   Dist  cc 20/50 20/25   Dist ph cc 20/30 NI       Tonometry (Tonopen, 9:45 AM)      Right Left   Pressure 18 18       Pupils      Dark Light  Shape React APD   Right 4 3 Round Brisk None   Left 4 3 Round Brisk None       Visual Fields (Counting fingers)      Left Right    Full Full       Extraocular Movement      Right Left    Full, Ortho Full, Ortho       Neuro/Psych    Oriented x3:  Yes   Mood/Affect:  Normal       Dilation    Both eyes:  1.0% Mydriacyl, 2.5% Phenylephrine @ 9:38 AM        Slit Lamp and Fundus Exam    Slit Lamp Exam      Right Left   Lids/Lashes Dermatochalasis - upper lid, Meibomian gland dysfunction Dermatochalasis - upper lid, Meibomian gland dysfunction   Conjunctiva/Sclera White and quiet White and quiet   Cornea Arcus, 1+ Punctate epithelial erosions Arcus, 1+ Punctate epithelial erosions   Anterior Chamber Deep and quiet Deep and quiet   Iris Round and dilated Round and reactive   Lens 2+ Nuclear sclerosis, 1+ Cortical cataract 2+ Nuclear sclerosis, 1+ Cortical cataract   Vitreous Vitreous syneresis, negative shafers , Posterior vitreous detachment Vitreous syneresis       Fundus Exam      Right Left   Disc Pink and Sharp Pink and Sharp   C/D Ratio 0.35 0.3   Macula Blunted foveal reflex, Drusen, No heme or edema Blunted foveal reflex, Retinal pigment epithelial mottling, Drusen, No heme or edema   Vessels Vascular attenuation Mild Vascular attenuation, mildly Tortuous   Periphery Attached, two small retinal tears at 0300 with surrounding heme -- no SRF, Reticular degeneration Attached, Choroidal nevus at 0930 mid-zone, flat, small, 0.25 x 0.75 DD        Refraction    Wearing Rx      Sphere Cylinder Axis   Right -2.50 Sphere    Left -2.25 +1.00 037   Age:  63yr  Type:  SVL       Manifest Refraction      Sphere Cylinder Axis Dist VA   Right -2.25 Sphere  20/40   Left -2.50 +1.00 037 20/25+2          IMAGING AND PROCEDURES  Imaging and Procedures for '@TODAY' @  OCT, Retina - OU - Both Eyes       Right Eye Quality was good. Central Foveal Thickness: 273.  Progression has no prior data. Findings include normal foveal contour, no IRF, no SRF, retinal drusen .   Left Eye Quality was good. Central Foveal Thickness: 274. Progression has no prior data. Findings include normal foveal contour, no IRF, no SRF, retinal drusen , vitreomacular adhesion .   Notes *Images captured and stored on drive  Diagnosis / Impression:  NFP, No IRF/SRF OU Retinal drusen OU  Clinical management:  See below  Abbreviations: NFP - Normal foveal profile. CME - cystoid macular edema. PED - pigment epithelial detachment. IRF - intraretinal fluid. SRF - subretinal fluid. EZ - ellipsoid zone. ERM - epiretinal membrane. ORA - outer retinal atrophy. ORT - outer retinal tubulation. SRHM - subretinal hyper-reflective material  Repair Retinal Breaks, Laser - OD - Right Eye       LASER PROCEDURE NOTE  Procedure:  Barrier laser retinopexy using slit lamp laser, RIGHT eye   Diagnosis:   Retinal tears, right eye                     Tears at 3 o'clock anterior to equator   Surgeon: Bernarda Caffey, MD, PhD  Anesthesia: Topical  Informed consent obtained, operative eye marked, and time out performed prior to initiation of laser.   Laser settings:  Lumenis Smart532 laser, slit lamp Lens: [place]ster PRP 165 Power: 240 mW Spot size: 200 microns Duration: 30 msec  # spots: 182  Placement of laser: Using a Mainster PRP 165 contact lens at the slit lamp, laser was placed in three confluent rows around two tears at 3 oclock anterior to equator with additional rows anteriorly.  Complications: None.  Patient tolerated the procedure well and received written and verbal post-procedure care information/education.                  ASSESSMENT/PLAN:    ICD-10-CM   1. Multiple defects of right retina without detachment H33.331 Repair Retinal Breaks, Laser - OD - Right Eye  2. Posterior vitreous detachment of right eye H43.811   3. Early dry stage nonexudative  age-related macular degeneration of both eyes H35.3131   4. Choroidal nevus of left eye D31.32   5. Retinal edema H35.81 OCT, Retina - OU - Both Eyes  6. Combined forms of age-related cataract of both eyes H25.813     1. Retinal tears, OD   - The incidence, risk factors, and natural history of retinal tear was discussed with patient.   - Potential treatment options including laser retinopexy and cryotherapy discussed with patient. - two small tears at 0300 with surrounding heme -- no SRF - recommend laser retinopexy OD today (10.03.19) - RBA of procedure discussed, questions answered - informed consent obtained and signed - see procedure note - start PF QID x7 days - f/u in 2-3 wks  2. PVD / vitreous syneresis OD  Discussed findings and prognosis  No RD on 360 peripheral exam  Reviewed s/s of RT/RD  Strict return precautions for any such RT/RD signs/symptoms  3.Age related macular degeneration, non-exudative, both eyes  - early stage OU  - The incidence, anatomy, and pathology of dry AMD, risk of progression, and the AREDS and AREDS 2 study including smoking risks discussed with patient.  - Recommend amsler grid monitoring  4. Choroidal nevus OS-  - small, flat, at 0930 mid-zone - no suspicious features - monitor  5. No retinal edema on exam or OCT  6. Combined form age related cataract OU-  - The symptoms of cataract, surgical options, and treatments and risks were discussed with patient. - discussed diagnosis and progression - not yet visually significant - monitor for now    Ophthalmic Meds Ordered this visit:  Meds ordered this encounter  Medications  . prednisoLONE acetate (PRED FORTE) 1 % ophthalmic suspension    Sig: Place 1 drop into the right eye 4 (four) times daily for 7 days.    Dispense:  10 mL    Refill:  0       Return in about 3 weeks (around 09/18/2018) for F/U laser ret OD, DFE.  There are no Patient Instructions on file for this  visit.   Explained the diagnoses, plan, and follow up with the patient and  they expressed understanding.  Patient expressed understanding of the importance of proper follow up care.   This document serves as a record of services personally performed by Gardiner Sleeper, MD, PhD. It was created on their behalf by Ernest Mallick, OA, an ophthalmic assistant. The creation of this record is the provider's dictation and/or activities during the visit.    Electronically signed by: Ernest Mallick, OA  10.02.19 11:15 AM   This document serves as a record of services personally performed by Gardiner Sleeper, MD, PhD. It was created on their behalf by Catha Brow, [place], a certified ophthalmic assistant. The creation of this record is the provider's dictation and/or activities during the visit.  Electronically signed by: Catha Brow, COA  10.03.19 11:15 AM   Gardiner Sleeper, M.D., Ph.D. Diseases & Surgery of the Retina and Vitreous [facility]   I have reviewed the above documentation for accuracy and completeness, and I agree with the above. Gardiner Sleeper, M.D., Ph.D. 08/28/18 11:17 AM    Abbreviations: M myopia (nearsighted); A astigmatism; H hyperopia (farsighted); P presbyopia; Mrx spectacle prescription;  CTL contact lenses; OD right eye; OS left eye; OU both eyes  XT exotropia; ET esotropia; PEK punctate epithelial keratitis; PEE punctate epithelial erosions; DES dry eye syndrome; MGD meibomian gland dysfunction; ATs artificial tears; PFAT's preservative free artificial tears; [place] nuclear sclerotic cataract; PSC posterior subcapsular cataract; ERM epi-retinal membrane; PVD posterior vitreous detachment; RD retinal detachment; DM diabetes mellitus; DR diabetic retinopathy; NPDR non-proliferative diabetic retinopathy; PDR proliferative diabetic retinopathy; CSME clinically significant macular edema; DME diabetic macular edema; dbh dot blot hemorrhages; CWS cotton wool spot;  POAG primary open angle glaucoma; C/D cup-to-disc ratio; HVF humphrey visual field; GVF goldmann visual field; OCT optical coherence tomography; IOP intraocular pressure; BRVO Branch retinal vein occlusion; CRVO central retinal vein occlusion; CRAO central retinal artery occlusion; BRAO branch retinal artery occlusion; RT retinal tear; SB scleral buckle; PPV pars plana vitrectomy; VH Vitreous hemorrhage; PRP panretinal laser photocoagulation; IVK intravitreal kenalog; VMT vitreomacular traction; MH Macular hole;  NVD neovascularization of the disc; NVE neovascularization elsewhere; AREDS age related eye disease study; ARMD age related macular degeneration; POAG primary open angle glaucoma; EBMD epithelial/anterior basement membrane dystrophy; ACIOL anterior chamber intraocular lens; IOL intraocular lens; PCIOL posterior chamber intraocular lens; Phaco/IOL phacoemulsification with intraocular lens placement; [place] photorefractive keratectomy; LASIK laser assisted in situ keratomileusis; HTN hypertension; DM diabetes mellitus; COPD chronic obstructive pulmonary disease

## 2018-08-27 NOTE — Telephone Encounter (Signed)
Last Visit: 05/08/18 Next visit: 11/11/18  Okay to refill Nuvigil?

## 2018-08-27 NOTE — Patient Instructions (Addendum)
Office testing: FENO - 48 (elevated, indicates inflammation of airways) Walk test - Ambulatory O2 sat low 96%  Labs today please   Rx:  Trial Qvar -1 puff twice daily (take every day no matter how you feel) Prednisone taper as prescribed   Follow-up: 4-6 weeks with full PFTs and methacholine challenge with Dr. Lenna Gilford or Eustaquio Maize, NP   Recommendations: Metroprolol can cause bronchospasm(wheezing/sob) in some patient, please discuss with cardiology whether or not you should try a different medication

## 2018-08-27 NOTE — Assessment & Plan Note (Signed)
-  Following with nutrition - Has met with bariatric surgeon

## 2018-08-27 NOTE — Assessment & Plan Note (Addendum)
-   Spirometry in August showed mild restriction, possible underlying asthma - Ambulatory O2 sat maintained 96% RA, patient became easily fatigued  - FENO elevated at 48 today - Checking CBC with diff, BNP, Ddimer and IgE - Plan prednisone taper  - Trial Qvar 40 1 puff twice daily - Needs full PFTs with mechalcholine challenge - Changing provider to Dr. Halford Chessman, has follow-up scheduled for Oct 30th

## 2018-08-27 NOTE — Telephone Encounter (Signed)
ok 

## 2018-08-27 NOTE — Progress Notes (Signed)
Reviewed and agree with assessment/plan.   Asti Mackley, MD [facility] 11/21/2016, 12:24 PM Pager:  [phone]  

## 2018-08-27 NOTE — Addendum Note (Signed)
Addended by: Nena Polio on: 08/27/2018 03:28 PM   Modules accepted: Orders

## 2018-08-28 ENCOUNTER — Ambulatory Visit (HOSPITAL_COMMUNITY)
Admission: RE | Admit: 2018-08-28 | Discharge: 2018-08-28 | Disposition: A | Discharge status: Home / Self Care | Payer: Medicare HMO | Source: Ambulatory Visit | Attending: Primary Care | Admitting: Primary Care

## 2018-08-28 ENCOUNTER — Telehealth: Payer: Self-pay

## 2018-08-28 ENCOUNTER — Telehealth: Payer: Self-pay | Admitting: Primary Care

## 2018-08-28 ENCOUNTER — Ambulatory Visit (INDEPENDENT_AMBULATORY_CARE_PROVIDER_SITE_OTHER)
Admission: RE | Admit: 2018-08-28 | Discharge: 2018-08-28 | Disposition: A | Discharge status: Home / Self Care | Payer: Medicare HMO | Source: Ambulatory Visit | Attending: Primary Care | Admitting: Primary Care

## 2018-08-28 ENCOUNTER — Ambulatory Visit (INDEPENDENT_AMBULATORY_CARE_PROVIDER_SITE_OTHER): Payer: Medicare HMO | Admitting: Ophthalmology

## 2018-08-28 ENCOUNTER — Encounter (INDEPENDENT_AMBULATORY_CARE_PROVIDER_SITE_OTHER): Payer: Self-pay | Admitting: Ophthalmology

## 2018-08-28 DIAGNOSIS — H353131 Nonexudative age-related macular degeneration, bilateral, early dry stage: Secondary | ICD-10-CM

## 2018-08-28 DIAGNOSIS — H25813 Combined forms of age-related cataract, bilateral: Secondary | ICD-10-CM | POA: Diagnosis not present

## 2018-08-28 DIAGNOSIS — R0602 Shortness of breath: Secondary | ICD-10-CM | POA: Insufficient documentation

## 2018-08-28 DIAGNOSIS — H43811 Vitreous degeneration, right eye: Secondary | ICD-10-CM | POA: Diagnosis not present

## 2018-08-28 DIAGNOSIS — D3132 Benign neoplasm of left choroid: Secondary | ICD-10-CM

## 2018-08-28 DIAGNOSIS — H3581 Retinal edema: Secondary | ICD-10-CM

## 2018-08-28 DIAGNOSIS — H33331 Multiple defects of retina without detachment, right eye: Secondary | ICD-10-CM

## 2018-08-28 DIAGNOSIS — R7989 Other specified abnormal findings of blood chemistry: Secondary | ICD-10-CM | POA: Diagnosis not present

## 2018-08-28 HISTORY — PX: RETINAL LASER PROCEDURE: SHX2339

## 2018-08-28 LAB — D-DIMER, QUANTITATIVE: D-Dimer, Quant: 7.87 mcg/mL FEU — ABNORMAL HIGH (ref <0.50)

## 2018-08-28 LAB — IGE: IgE (Immunoglobulin E), Serum: 30 kU/L (ref <=114)

## 2018-08-28 MED ORDER — PREDNISOLONE ACETATE 1 % OP SUSP: 1.0000 [drp] | Freq: Four times a day (QID) | OPHTHALMIC | 0 refills | Status: AC

## 2018-08-28 MED ORDER — IOPAMIDOL (ISOVUE-370) INJECTION 76%
80.0000 mL | Freq: Once | INTRAVENOUS | Status: AC | PRN
  Administered 2018-08-28: 80 mL via INTRAVENOUS

## 2018-08-28 NOTE — Telephone Encounter (Signed)
S/p left total knee on 08/19. Had recent CTA on 08/28 that was negative for PE.  D-dimer elevated 7.87. Needs stat CTA today and bilateral NIVS. Scheduled for this afternoon, they will hold her until resulted. Advised if not done today ED. Can you follow-up on results, I'm out of the office this afternoon.   Thanks

## 2018-08-28 NOTE — Telephone Encounter (Signed)
Saw that pt has been scheduled for the CTA today, 08/28/18 not 10/8.  Pt is having it performed at 2pm.  Routing message to Derl Barrow, NP

## 2018-08-28 NOTE — Telephone Encounter (Signed)
Final Interpretation: Right: No evidence of deep vein thrombosis in the lower extremity. No indirect evidence of obstruction proximal to the inguinal ligament. No reflux was noted in the common femoral vein. Left: No evidence of deep vein thrombosis in the lower extremity. No indirect evidence of obstruction proximal to the inguinal ligament. Avascular mass with echogenicity in the popliteal fossa.   Aaron Edelman here is a copy of the BLE Ultrasound results.

## 2018-08-28 NOTE — Telephone Encounter (Signed)
-----   Message from Martyn Ehrich, NP sent at 08/28/2018  8:44 AM EDT ----- Can you order stat CTA r/o PE. D-dimer elevated.

## 2018-08-28 NOTE — Telephone Encounter (Signed)
Called and spoke with patient, made aware of results. Patient voiced understanding. Nothing further is needed.

## 2018-08-28 NOTE — Telephone Encounter (Signed)
Can we please contact the patient to let them know the results.    They need to continue to monitor for shortness of breath or changes in their breathing.  If they have sudden increased shortness of breath / chest pain they do need to present to the emergency room.    CT was negative for a clot in your lungs.  There was no deep vein thrombosis (clot) seen in either of your legs.  This is good news.  Keep follow-up with our office.  I have updated BW with your care she was awaiting the results to check in on you.  Wyn Quaker FNP

## 2018-08-28 NOTE — Telephone Encounter (Signed)
LMTCB x 2. Patient needs to have a STAT CT Angio as well as STAT bilateral lower extremity ultrasound to rule out PE.

## 2018-08-28 NOTE — Telephone Encounter (Signed)
Called and spoke to patient's husband. Patient currently having eye surgery. Told husband our Patient Care Coordinators would be contacting him soon.   Orders placed.   Routed to [facility].  Called patient back to inform if unable to get test done today patient needs to go to the Emergency room due to elevated d-dimer and patient's risk. Per Derl Barrow NP

## 2018-08-28 NOTE — Telephone Encounter (Signed)
Thank you for letting me know.  I will be following for the results today.  If the patient has any questions or concerns they are always more than welcome to contact our office.  I will also let triage team know today, to be watching for results.   Wyn Quaker FNP

## 2018-08-28 NOTE — Telephone Encounter (Signed)
Pt has CTA scheduled today 09/02/18

## 2018-09-01 NOTE — Telephone Encounter (Signed)
Thank you for following up.   Wyn Quaker FNP

## 2018-09-03 ENCOUNTER — Telehealth: Payer: Self-pay | Admitting: Rheumatology

## 2018-09-03 DIAGNOSIS — R69 Illness, unspecified: Secondary | ICD-10-CM | POA: Diagnosis not present

## 2018-09-03 NOTE — Telephone Encounter (Signed)
Prior authorization has been approved until 10/2019. Advised patient's husband who will let patient know.

## 2018-09-03 NOTE — Telephone Encounter (Signed)
Prior authorization submitted via cover my meds for Armodafinil. Contacted patient and left message with patient's husband to advise. He states he will advise patient. Advised will call when response is received whether it is approved or denied.

## 2018-09-03 NOTE — Telephone Encounter (Signed)
Patient called stating she has been unable to pick up her prescription of Armodafinil from [facility] because they need a prior authorization before it can be refilled.  Patient states she was told by [facility] that they contacted Dr. Arlean Hopping office and patient is requesting a return call to give her an update.

## 2018-09-04 ENCOUNTER — Ambulatory Visit (HOSPITAL_COMMUNITY)
Admission: RE | Admit: 2018-09-04 | Discharge: 2018-09-04 | Disposition: A | Discharge status: Home / Self Care | Payer: Medicare HMO | Source: Ambulatory Visit | Attending: Primary Care | Admitting: Primary Care

## 2018-09-04 DIAGNOSIS — R0600 Dyspnea, unspecified: Secondary | ICD-10-CM | POA: Diagnosis present

## 2018-09-04 LAB — PULMONARY FUNCTION TEST
FEF 25-75 Pre: 1.71 L/sec
FEF2575-%Pred-Pre: 78 %
FEV1-%Pred-Pre: 77 %
FEV1-Pre: 1.81 L
FEV1FVC-%Pred-Pre: 103 %
FEV6-%Pred-Pre: 76 %
FEV6-Pre: 2.24 L
FEV6FVC-%Pred-Pre: 104 %
FVC-%Pred-Pre: 74 %
FVC-Pre: 2.24 L
Pre FEV1/FVC ratio: 81 %
Pre FEV6/FVC Ratio: 100 %

## 2018-09-04 MED ORDER — METHACHOLINE 16 MG/ML NEB SOLN
2.0000 mL | Freq: Once | RESPIRATORY_TRACT | Status: DC
  Filled 2018-09-04: qty 2

## 2018-09-04 MED ORDER — METHACHOLINE 0.0625 MG/ML NEB SOLN
2.0000 mL | Freq: Once | RESPIRATORY_TRACT | Status: DC
  Filled 2018-09-04: qty 2

## 2018-09-04 MED ORDER — METHACHOLINE 0.25 MG/ML NEB SOLN
2.0000 mL | Freq: Once | RESPIRATORY_TRACT | Status: DC
  Filled 2018-09-04: qty 2

## 2018-09-04 MED ORDER — METHACHOLINE 1 MG/ML NEB SOLN
2.0000 mL | Freq: Once | RESPIRATORY_TRACT | Status: DC
  Filled 2018-09-04: qty 2

## 2018-09-04 MED ORDER — ALBUTEROL SULFATE (2.5 MG/3ML) 0.083% IN NEBU: 2.5000 mg | INHALATION_SOLUTION | Freq: Once | RESPIRATORY_TRACT | Status: DC

## 2018-09-04 MED ORDER — SODIUM CHLORIDE 0.9 % IN NEBU
3.0000 mL | INHALATION_SOLUTION | Freq: Once | RESPIRATORY_TRACT | Status: AC
  Administered 2018-09-04: 3 mL via RESPIRATORY_TRACT
  Filled 2018-09-04: qty 3

## 2018-09-04 MED ORDER — METHACHOLINE 4 MG/ML NEB SOLN
2.0000 mL | Freq: Once | RESPIRATORY_TRACT | Status: DC
  Filled 2018-09-04: qty 2

## 2018-09-04 NOTE — Telephone Encounter (Signed)
Per patient's chart, results have been discussed with her. Will close this encounter.

## 2018-09-04 NOTE — Progress Notes (Signed)
Patient came in as an outpatient today for a methacholine challenge test. Her appointment was scheduled at 10:00 & patient arrived on time at 9:45. I called the pharmacy to send the methacholine for the test at 9:39 & was told it had not been made up yet & that they would make it up & send it between 10:00-10:15. Phillis Knack RRT called the pharmacy back 3 different times to check on the status & was told they were sending it up, but it never came. Patient was very frustrated & did not want to wait any longer as of 10:40 so she was rescheduled to October 22. As of this time the methacholine has still not been sent up.  Kathie Dike RRT

## 2018-09-05 ENCOUNTER — Other Ambulatory Visit: Payer: Self-pay | Admitting: Rheumatology

## 2018-09-05 NOTE — Telephone Encounter (Signed)
Last Visit: 05/08/18 Next visit: 11/11/18  Okay to refill per Dr. Estanislado Pandy

## 2018-09-09 ENCOUNTER — Encounter: Payer: Self-pay | Admitting: Gynecology

## 2018-09-09 ENCOUNTER — Ambulatory Visit: Payer: Medicare HMO | Admitting: Gynecology

## 2018-09-09 VITALS — BP 134/84 | Ht 62.5 in | Wt 224.0 lb

## 2018-09-09 DIAGNOSIS — N952 Postmenopausal atrophic vaginitis: Secondary | ICD-10-CM

## 2018-09-09 DIAGNOSIS — Z124 Encounter for screening for malignant neoplasm of cervix: Secondary | ICD-10-CM | POA: Diagnosis not present

## 2018-09-09 DIAGNOSIS — Z01419 Encounter for gynecological examination (general) (routine) without abnormal findings: Secondary | ICD-10-CM | POA: Diagnosis not present

## 2018-09-09 DIAGNOSIS — N3946 Mixed incontinence: Secondary | ICD-10-CM

## 2018-09-09 DIAGNOSIS — K429 Umbilical hernia without obstruction or gangrene: Secondary | ICD-10-CM

## 2018-09-09 NOTE — Patient Instructions (Signed)
Follow-up in 1 year for annual exam 

## 2018-09-09 NOTE — Addendum Note (Signed)
Addended by: Nelva Nay on: 09/09/2018 12:39 PM   Modules accepted: Orders

## 2018-09-09 NOTE — Progress Notes (Signed)
    [name] 02/23/1956 [id number]        62 y.o.  [name] for annual gynecologic exam.  Without gynecologic complaints  Past medical history,surgical history, problem list, medications, allergies, family history and social history were all reviewed and documented as reviewed in the EPIC chart.  ROS:  Performed with pertinent positives and negatives included in the history, assessment and plan.   Additional significant findings : None   Exam: [name] assistant Vitals:   09/09/18 1137  BP: 134/84  Weight: 224 lb (101.6 kg)  Height: 5' 2.5" (1.588 m)   Body mass index is 40.32 kg/m.  General appearance:  Normal affect, orientation and appearance. Skin: Grossly normal HEENT: Without gross lesions.  No cervical or supraclavicular adenopathy. Thyroid normal.  Lungs:  Clear without wheezing, rales or rhonchi Cardiac: RR, without RMG Abdominal:  Soft, nontender, without masses, guarding, rebound, organomegaly.  Umbilical hernia noted easily reducible. Breasts:  Examined lying and sitting without masses, retractions, discharge or axillary adenopathy. Pelvic:  Ext, BUS, Vagina: Normal with atrophic changes  Cervix: Normal with atrophic changes  Uterus: Difficult to palpate but no gross masses or tenderness  Adnexa: Without gross masses or tenderness    Anus and perineum: Normal   Rectovaginal: Normal sphincter tone without palpated masses or tenderness.    Assessment/Plan:  62 y.o. G59P1001 female for annual gynecologic exam.   1. Postmenopausal/atrophic genital changes.  No significant menopausal symptoms or any vaginal bleeding. 2. Umbilical hernia, present for years.  Easily reducible.  Not bothersome to the patient. 3. Mammography 05/2018.  Continue with annual mammography when due.  Breast exam normal today. 4. DEXA 2018 normal.  Plan repeat DEXA at age 51. 57. Pap smear 2016.  Pap smear/HPV today.  No history of significant abnormal Pap smears. 6. Colonoscopy 2014.   Repeat at their recommended interval. 7. History of mixed incontinence.  Actively being followed by urology. 8. Health maintenance.  Contemplating bariatric surgery.  No routine lab work done today as patient does this through her primary physician's office.  Follow-up 1 year, sooner as needed.   [name], 12:09 PM 09/09/2018

## 2018-09-10 ENCOUNTER — Ambulatory Visit: Payer: Medicare HMO | Admitting: Skilled Nursing Facility1

## 2018-09-10 ENCOUNTER — Other Ambulatory Visit: Payer: Self-pay | Admitting: Family Medicine

## 2018-09-11 DIAGNOSIS — G609 Hereditary and idiopathic neuropathy, unspecified: Secondary | ICD-10-CM | POA: Diagnosis not present

## 2018-09-11 LAB — PAP IG W/ RFLX HPV ASCU

## 2018-09-15 ENCOUNTER — Other Ambulatory Visit: Payer: Self-pay | Admitting: Family Medicine

## 2018-09-15 ENCOUNTER — Encounter: Payer: Self-pay | Admitting: Skilled Nursing Facility1

## 2018-09-15 ENCOUNTER — Encounter: Payer: Medicare HMO | Attending: Surgery | Admitting: Skilled Nursing Facility1

## 2018-09-15 DIAGNOSIS — Z9049 Acquired absence of other specified parts of digestive tract: Secondary | ICD-10-CM | POA: Diagnosis not present

## 2018-09-15 DIAGNOSIS — E669 Obesity, unspecified: Secondary | ICD-10-CM

## 2018-09-15 DIAGNOSIS — Z713 Dietary counseling and surveillance: Secondary | ICD-10-CM | POA: Insufficient documentation

## 2018-09-15 DIAGNOSIS — Z818 Family history of other mental and behavioral disorders: Secondary | ICD-10-CM | POA: Diagnosis not present

## 2018-09-15 DIAGNOSIS — Z823 Family history of stroke: Secondary | ICD-10-CM | POA: Insufficient documentation

## 2018-09-15 DIAGNOSIS — Z888 Allergy status to other drugs, medicaments and biological substances status: Secondary | ICD-10-CM | POA: Insufficient documentation

## 2018-09-15 DIAGNOSIS — E785 Hyperlipidemia, unspecified: Secondary | ICD-10-CM

## 2018-09-15 DIAGNOSIS — Z8249 Family history of ischemic heart disease and other diseases of the circulatory system: Secondary | ICD-10-CM | POA: Diagnosis not present

## 2018-09-15 DIAGNOSIS — Z79899 Other long term (current) drug therapy: Secondary | ICD-10-CM | POA: Diagnosis not present

## 2018-09-15 DIAGNOSIS — Z8261 Family history of arthritis: Secondary | ICD-10-CM | POA: Insufficient documentation

## 2018-09-15 DIAGNOSIS — Z905 Acquired absence of kidney: Secondary | ICD-10-CM | POA: Diagnosis not present

## 2018-09-15 DIAGNOSIS — Z6841 Body Mass Index (BMI) 40.0 and over, adult: Secondary | ICD-10-CM | POA: Diagnosis not present

## 2018-09-15 DIAGNOSIS — K219 Gastro-esophageal reflux disease without esophagitis: Secondary | ICD-10-CM | POA: Diagnosis not present

## 2018-09-15 DIAGNOSIS — Z841 Family history of disorders of kidney and ureter: Secondary | ICD-10-CM | POA: Diagnosis not present

## 2018-09-15 DIAGNOSIS — G4733 Obstructive sleep apnea (adult) (pediatric): Secondary | ICD-10-CM | POA: Diagnosis not present

## 2018-09-15 DIAGNOSIS — Z8379 Family history of other diseases of the digestive system: Secondary | ICD-10-CM | POA: Diagnosis not present

## 2018-09-15 NOTE — Patient Instructions (Addendum)
-  Eat at least every 5 hours; do not skip meals   -If you have a late lunch do not make snacking into a meal; either decide to have a meal at 6 or 7 or 1 snack such has half a peanut butter sandwich or 1 cheese stick or vegetables or 1 piece of fruit   -If you are going to have a snack at night have it be a fruit or vegetable   -Keep working on not drinking Gatorade and mountain dew  -continue to work on eating vegetables every day

## 2018-09-15 NOTE — Progress Notes (Signed)
RYGB Assessment: 4th SWL Appointment.  Pt needs 6 visits.  Pt has gastroparesis and GERD.  Pt states she is getting knee surgery August 19th.  Pt states she does not like vegetables.  Pt arrives having gained about 3 pounds. Pt states she has had trouble breathing from her knee surgery. Pt states she has been back and forth with the doctor due to her breathing and is still working on diagnosing the issue.  Pt arrives having gained about 2 pounds. Pt arrives late to her appt due to having trouble breathing. Pt states she is still working on getting her breathing issues diagnosed. Pt states she has changed doctors. Pt states due to her breathing issues she has not been able to be active or do her PT for her knees. Pt states she has tried the alkaline water. Pt states she has noticed if she does not eat lunch she snacks too much after dinner. Pt states he usually starts out with nausea in the morning. Pt states she is back to struggling with eating vegetables. Pt listed the different things she has going on in her life that has been barriers to her making changes.   Start Wt at NDES: 225.8 Wt: 223 BMI: 41.24   MEDICATIONS: See List   DIETARY INTAKE:  24-hr recall: eating spoonfuls of peanut butter  B ( AM): rice crispie cereal with banana or yogurt or cheese toast Snk ( AM): 1/2 cookie or fruit or yogurt L ( PM): sandwich or salad or skipped Snk ( PM): banana or P3 or 100 calorie pack of nuts D ( PM): roast with potatoes and carrots  Snk ( PM): fritos  Beverages: water, regular gatordae, diet mountain dew  Usual physical activity: ADL's  Diet to Follow: 1500 calories 170 g carbohydrates 112 g protein 42 g fat   Nutritional Diagnosis:  Ross-3.3 Overweight/obesity related to past poor dietary habits and physical inactivity as evidenced by patient w/ planned RYGB surgery following dietary guidelines for continued weight loss.    Intervention:  Nutrition counseling for upcoming  Bariatric Surgery. Goals: -Encouraged to engage in 150 minutes of moderate physical activity including cardiovascular and weight baring weekly -Eat at least every 5 hours; do not skip meals  -If you have a late lunch do not make snacking into a meal; either decide to have a meal at 6 or 7 or 1 snack such has half a peanut butter sandwich or 1 cheese stick or vegetables or 1 piece of fruit  -If you are going to have a snack at night have it be a fruit or vegetable  -Keep working on not drinking Gatorade and mountain dew -continue to work on eating vegetables every day  Teaching Method Utilized:  [employer] Hands on  Barriers to learning/adherence to lifestyle change: none identified   Demonstrated degree of understanding via:  Teach Back   Monitoring/Evaluation:  Dietary intake, exercise,and body weight prn.

## 2018-09-16 ENCOUNTER — Ambulatory Visit (HOSPITAL_COMMUNITY)
Admission: RE | Admit: 2018-09-16 | Discharge: 2018-09-16 | Disposition: A | Discharge status: Home / Self Care | Payer: Medicare HMO | Source: Ambulatory Visit | Attending: Primary Care | Admitting: Primary Care

## 2018-09-16 DIAGNOSIS — R0600 Dyspnea, unspecified: Secondary | ICD-10-CM | POA: Insufficient documentation

## 2018-09-16 LAB — PULMONARY FUNCTION TEST
FEF 25-75 Post: 0.76 L/sec
FEF 25-75 Pre: 1.93 L/sec
FEF2575-%Change-Post: -60 %
FEF2575-%Pred-Post: 34 %
FEF2575-%Pred-Pre: 88 %
FEV1-%Change-Post: -17 %
FEV1-%Pred-Post: 67 %
FEV1-%Pred-Pre: 82 %
FEV1-Post: 1.58 L
FEV1-Pre: 1.93 L
FEV1FVC-%Change-Post: 0 %
FEV1FVC-%Pred-Pre: 106 %
FEV6-%Change-Post: -18 %
FEV6-%Pred-Post: 64 %
FEV6-%Pred-Pre: 79 %
FEV6-Post: 1.9 L
FEV6-Pre: 2.33 L
FEV6FVC-%Pred-Post: 103 %
FEV6FVC-%Pred-Pre: 103 %
FVC-%Change-Post: -18 %
FVC-%Pred-Post: 62 %
FVC-%Pred-Pre: 76 %
FVC-Post: 1.9 L
FVC-Pre: 2.33 L
Post FEV1/FVC ratio: 84 %
Post FEV6/FVC ratio: 100 %
Pre FEV1/FVC ratio: 83 %
Pre FEV6/FVC Ratio: 100 %

## 2018-09-16 MED ORDER — METHACHOLINE 4 MG/ML NEB SOLN
2.0000 mL | Freq: Once | RESPIRATORY_TRACT | Status: AC
  Administered 2018-09-16: 8 mg via RESPIRATORY_TRACT
  Filled 2018-09-16: qty 2

## 2018-09-16 MED ORDER — METHACHOLINE 0.0625 MG/ML NEB SOLN
2.0000 mL | Freq: Once | RESPIRATORY_TRACT | Status: AC
  Administered 2018-09-16: 0.125 mg via RESPIRATORY_TRACT
  Filled 2018-09-16: qty 2

## 2018-09-16 MED ORDER — ALBUTEROL SULFATE (2.5 MG/3ML) 0.083% IN NEBU
2.5000 mg | INHALATION_SOLUTION | Freq: Once | RESPIRATORY_TRACT | Status: AC
  Administered 2018-09-16: 2.5 mg via RESPIRATORY_TRACT

## 2018-09-16 MED ORDER — METHACHOLINE 1 MG/ML NEB SOLN
2.0000 mL | Freq: Once | RESPIRATORY_TRACT | Status: AC
  Administered 2018-09-16: 2 mg via RESPIRATORY_TRACT
  Filled 2018-09-16: qty 2

## 2018-09-16 MED ORDER — METHACHOLINE 16 MG/ML NEB SOLN
2.0000 mL | Freq: Once | RESPIRATORY_TRACT | Status: AC
  Administered 2018-09-16: 32 mg via RESPIRATORY_TRACT
  Filled 2018-09-16: qty 2

## 2018-09-16 MED ORDER — METHACHOLINE 0.25 MG/ML NEB SOLN
2.0000 mL | Freq: Once | RESPIRATORY_TRACT | Status: AC
  Administered 2018-09-16: 0.5 mg via RESPIRATORY_TRACT
  Filled 2018-09-16: qty 2

## 2018-09-16 MED ORDER — SODIUM CHLORIDE 0.9 % IN NEBU
3.0000 mL | INHALATION_SOLUTION | Freq: Once | RESPIRATORY_TRACT | Status: AC
  Administered 2018-09-16: 3 mL via RESPIRATORY_TRACT
  Filled 2018-09-16: qty 3

## 2018-09-17 ENCOUNTER — Telehealth: Payer: Self-pay | Admitting: Rheumatology

## 2018-09-17 NOTE — Progress Notes (Signed)
[facility] Note  09/18/2018     CHIEF COMPLAINT Patient presents for Post-op Follow-up   HISTORY OF PRESENT ILLNESS: [name] is a 62 y.o. female who presents to the clinic today for:   HPI    Post-op Follow-up    In right eye.  Discomfort includes none.  Vision is stable.  I, the attending physician,  performed the HPI with the patient and updated documentation appropriately.          Comments    62 y/o female pt returning for POV s/p laser retinopexy OD on 10.03.19.  No change in [place] OU.  Denies pain, flashes, floaters.  Systane Complete bid OU.       Last edited by Matthew Folks, COA on 09/18/2018  9:20 AM. (History)    pt states she tolerated laser well, she used gtts as instructed, she denies seeing anymore flashes of light Referring physician: Rita Ohara, MD [place], [place] [id number]  HISTORICAL INFORMATION:   Selected notes from the MEDICAL RECORD NUMBER Referred by Dr. Virgina Evener for concern of PVD OD with retinal tears LEE: 10.01.19 Sammie Bench) [BCVA: OD: 20/40++ OS: 20/25] Ocular Hx-non-exu ARMD OU, cataract OU, DES OU PMH-anemia, anxiety, depression, edema, fibromyalgia, HLD, migraines, neuropathy    CURRENT MEDICATIONS: Current Outpatient Medications (Ophthalmic Drugs)  Medication Sig  . Propylene Glycol (SYSTANE COMPLETE) 0.6 % SOLN Place 1 drop into both eyes 2 (two) times daily.   No current facility-administered medications for this visit.  (Ophthalmic Drugs)   Current Outpatient Medications (Other)  Medication Sig  . acetaminophen (TYLENOL) 500 MG tablet Take 1,000 mg by mouth 3 (three) times daily as needed for moderate pain.  Marland Kitchen albuterol (PROVENTIL HFA;VENTOLIN HFA) 108 (90 Base) MCG/ACT inhaler Inhale 1-2 puffs into the lungs every 6 (six) hours as needed for wheezing or shortness of breath.  . allopurinol (ZYLOPRIM) 300 MG tablet TAKE 1 TABLET (300 MG TOTAL) DAILY BY MOUTH.  Marland Kitchen ALPRAZolam  (XANAX) 0.5 MG tablet Take 0.5 mg by mouth 3 (three) times daily as needed for anxiety.  . ARIPiprazole (ABILIFY) 5 MG tablet Take 5 mg by mouth daily.  . Armodafinil 250 MG tablet TAKE 1 TABLET EVERY DAY WITH BREAKFAST  . aspirin 81 MG tablet aspirin 81 mg tablet,delayed release  TAKE 1 TABLET BY MOUTH EVERY DAY  . atorvastatin (LIPITOR) 40 MG tablet TAKE 1 TABLET BY MOUTH EVERY DAY  . beclomethasone (QVAR REDIHALER) 40 MCG/ACT inhaler Inhale 1 puff into the lungs 2 (two) times daily.  . cetirizine (ZYRTEC) 10 MG tablet Take 10 mg by mouth at bedtime.  . cholecalciferol (VITAMIN D) 1000 units tablet Take 1,000 Units by mouth daily.  . colchicine 0.6 MG tablet Take 2 tablets by mouth at onset of gout flare. Taken an additional tablet 1 hour later, if needed. May take like this every 3d, if needed  . DULoxetine (CYMBALTA) 60 MG capsule Take 60 mg by mouth 2 (two) times daily.   . fluticasone (FLONASE) 50 MCG/ACT nasal spray PLACE 2 SPRAYS INTO BOTH NOSTRILS DAILY AS NEEDED FOR ALLERGIES (Patient taking differently: Place 2 sprays into both nostrils daily as needed for allergies. )  . gabapentin (NEURONTIN) 300 MG capsule Take 1 capsule (300 mg total) by mouth 3 (three) times daily. Gabapentin 300 mg Protocol Take a 300 mg capsule three times a day for two weeks following surgery. Then take a 300 mg capsule two times a day  for two weeks.  Then resume a 300 mg capsule once a day at bedtime.  . Melatonin 10 MG TABS Take 10 mg by mouth at bedtime.  . meloxicam (MOBIC) 15 MG tablet Take 15 mg by mouth daily.  . methocarbamol (ROBAXIN) 500 MG tablet Take 1 tablet (500 mg total) by mouth every 6 (six) hours as needed for muscle spasms.  . Multiple Vitamins-Minerals (ICAPS AREDS 2) CAPS Take 1 capsule by mouth 2 (two) times daily.  . nortriptyline (PAMELOR) 25 MG capsule Take 75 mg by mouth at bedtime.   . Omega-3 Fatty Acids (FISH OIL) 1200 MG CAPS Take 1,200 mg by mouth daily.  . ondansetron (ZOFRAN)  8 MG tablet Take 8 mg by mouth every 8 (eight) hours as needed for nausea.   . pantoprazole (PROTONIX) 40 MG tablet Take 40 mg by mouth 2 (two) times daily.  . Probiotic Product ([place]) CAPS Take 1 capsule by mouth at bedtime.   . prochlorperazine (COMPAZINE) 10 MG tablet Take 10 mg by mouth every 6 (six) hours as needed for nausea or vomiting.   . ranitidine (ZANTAC) 300 MG tablet Take 300 mg by mouth 2 (two) times daily.   Marland Kitchen topiramate (TOPAMAX) 25 MG tablet TAKE 2 TABLETS (50 MG TOTAL) BY MOUTH AT BEDTIME.  . vitamin B-12 (CYANOCOBALAMIN) 1000 MCG tablet Take 1,000 mcg by mouth daily.  . metoprolol tartrate (LOPRESSOR) 50 MG tablet Take 1 tablet (50 mg total) by mouth 2 (two) times daily.   No current facility-administered medications for this visit.  (Other)      REVIEW OF SYSTEMS: ROS    Positive for: Eyes   Negative for: Constitutional, Gastrointestinal, Neurological, Skin, Genitourinary, Musculoskeletal, HENT, Endocrine, Cardiovascular, Respiratory, Psychiatric, Allergic/Imm, Heme/Lymph   Last edited by Matthew Folks, COA on 09/18/2018  9:13 AM. (History)       ALLERGIES Allergies  Allergen Reactions  . Erythromycin Nausea Only    Abdominal pain  . Tramadol Itching  . Dilaudid [Hydromorphone Hcl] Itching    PAST MEDICAL HISTORY Past Medical History:  Diagnosis Date  . Anemia    previously followed by Dr. Jamse Arn for anemia and elevated platelets  . Anxiety   . C. difficile colitis 10/01/12   treated by [facility]  . Chronic fatigue syndrome   . DDD (degenerative disc disease), lumbar 08/19/14   and facet arthroplasty & left lumbar radiculopathy (Dr.Ramos)  . Depression   . Dyssynergia    dyssynergenic defecation, contributing to fecal incontinence.  . Edema   . Fibromyalgia   . Gastroparesis    follow[facility] at [facility]  . GERD (gastroesophageal reflux disease)   . Hyperlipidemia   . Kidney stone   . Lumbar radiculopathy   . Migraine   . Neuropathy    . Obstructive sleep apnea    Does  wear  CPAP  . Paresthesia    Dr. Everette Rank at [facility]  . Pelvic floor dysfunction    pelvic floor dyssynergy  . Plantar fasciitis 02/2011   R foot  . Pneumonia    2012  . PONV (postoperative nausea and vomiting)    pt states has gastroparesis has difficulty taking antibiotics and narcotics has severe nausea and vomiting   . Restless leg syndrome   . S/P endometrial ablation 08/09/2006   Novasure Ablation  . S/P epidural steroid injection 09/20/14   Dr.Ramos  . Tremor    Dr. Everette Rank  . Urinary frequency   . Urinary incontinence  Past Surgical History:  Procedure Laterality Date  . CHOLECYSTECTOMY  9/05  . ENDOMETRIAL ABLATION  08/09/2006   Dr. Valentina Shaggy Ablation  . FACET JOINT INJECTION  04/17/2017   Left L4-5 and L5-S1  . KNEE ARTHROPLASTY    . KNEE SURGERY  1999   R knee, Dr. Eddie Dibbles, torn cartilage  . RIGHT/LEFT HEART CATH AND CORONARY ANGIOGRAPHY N/A 01/01/2018   Procedure: RIGHT/LEFT HEART CATH AND CORONARY ANGIOGRAPHY;  Surgeon: Sherren Mocha, MD;  Location: [place] [facility];  Service: Cardiovascular;  Laterality: N/A;  . TONSILLECTOMY  1968  . TONSILLECTOMY    . TOTAL KNEE ARTHROPLASTY Right 09/05/2015   Procedure: RIGHT TOTAL KNEE ARTHROPLASTY;  Surgeon: Gaynelle Arabian, MD;  Location: [facility];  Service: Orthopedics;  Laterality: Right;  . TOTAL KNEE ARTHROPLASTY Left 07/14/2018   Procedure: LEFT TOTAL KNEE ARTHROPLASTY;  Surgeon: Gaynelle Arabian, MD;  Location: [facility];  Service: Orthopedics;  Laterality: Left;  . ULTRASOUND GUIDANCE FOR VASCULAR ACCESS  01/01/2018   Procedure: Ultrasound Guidance For Vascular Access;  Surgeon: Sherren Mocha, MD;  Location: [place] [facility];  Service: Cardiovascular;;    FAMILY HISTORY Family History  Problem Relation Age of Onset  . Allergies Mother   . Hypertension Mother   . Heart disease Mother        possible valve problem - leaking valve  . Macular degeneration Mother    . Heart disease Father        pacemaker, CHF  . Hypertension Father   . Diabetes Father        borderline  . Stroke Father 26  . Kidney disease Father   . Asthma Sister   . Irritable bowel syndrome Sister   . Allergies Sister   . Heart disease Paternal Grandmother   . Heart disease Paternal Grandfather   . Cancer Maternal Aunt        leukemia  . Cancer Maternal Aunt   . Colon cancer Maternal Aunt        late 66's  . CAD Neg Hx     SOCIAL HISTORY Social History   Tobacco Use  . Smoking status: Never Smoker  . Smokeless tobacco: Never Used  Substance Use Topics  . Alcohol use: No    Alcohol/week: 0.0 standard drinks  . Drug use: No         OPHTHALMIC EXAM:  Base Eye Exam    Visual Acuity (Snellen - Linear)      Right Left   Dist cc 20/50 20/25 -2   Dist ph cc 20/30    Correction:  Glasses       Tonometry (Tonopen, 9:22 AM)      Right Left   Pressure 12 12       Pupils      Dark Light Shape React APD   Right 4 3 Round Brisk None   Left 4 3 Round Brisk None       Visual Fields (Counting fingers)      Left Right    Full Full       Extraocular Movement      Right Left    Full, Ortho Full, Ortho       Neuro/Psych    Oriented x3:  Yes   Mood/Affect:  Normal       Dilation    Both eyes:  1.0% Mydriacyl, 2.5% Phenylephrine @ 9:22 AM        Slit Lamp and Fundus Exam    Slit Lamp Exam  Right Left   Lids/Lashes Dermatochalasis - upper lid, Meibomian gland dysfunction Dermatochalasis - upper lid, Meibomian gland dysfunction   Conjunctiva/Sclera White and quiet White and quiet   Cornea Arcus, 1+ Punctate epithelial erosions Arcus, 1+ Punctate epithelial erosions   Anterior Chamber Deep and quiet Deep and quiet   Iris Round and dilated Round and reactive   Lens 2+ Nuclear sclerosis, 1+ Cortical cataract 2+ Nuclear sclerosis, 1+ Cortical cataract   Vitreous Vitreous syneresis, negative shafers , Posterior vitreous detachment Vitreous syneresis        Fundus Exam      Right Left   Disc Pink and Sharp Pink and Sharp   C/D Ratio 0.35 0.3   Macula Blunted foveal reflex, Drusen, No heme or edema Blunted foveal reflex, Retinal pigment epithelial mottling, Drusen, No heme or edema   Vessels Vascular attenuation Mild Vascular attenuation, mildly Tortuous   Periphery Attached, two small retinal tears at 0300 with surrounding heme -- excellent laser surrounding both tears -- no SRF, Reticular degeneration Attached, Choroidal nevus at 0930 mid-zone, flat, small, 0.25 x 0.75 DD          IMAGING AND PROCEDURES  Imaging and Procedures for @TODAY @           ASSESSMENT/PLAN:    ICD-10-CM   1. Multiple defects of right retina without detachment H33.331   2. Posterior vitreous detachment of right eye H43.811   3. Early dry stage nonexudative age-related macular degeneration of both eyes H35.3131   4. Choroidal nevus of left eye D31.32   5. Retinal edema H35.81   6. Combined forms of age-related cataract of both eyes H25.813     1. Retinal tears, OD   - two small tears at 0300 with surrounding heme - S/P laser retinopexy OD (10.03.19)  -- good laser surrounding - no new RT/RD - f/u in 3 months, DFE, OCT  2. PVD / vitreous syneresis OD  Discussed findings and prognosis  No RD on 360 peripheral exam  Reviewed s/s of RT/RD  Strict return precautions for any such RT/RD signs/symptoms  3.Age related macular degeneration, non-exudative, both eyes  - early stage OU  - The incidence, anatomy, and pathology of dry AMD, risk of progression, and the AREDS and AREDS 2 study including smoking risks discussed with patient.  - Recommend amsler grid monitoring  4. Choroidal nevus OS-  - small, flat, at 0930 mid-zone - no suspicious features - monitor  5. No retinal edema on exam or OCT  6. Combined form age related cataract OU-  - The symptoms of cataract, surgical options, and treatments and risks were discussed with patient. -  discussed diagnosis and progression - not yet visually significant - monitor for now    Ophthalmic Meds Ordered this visit:  No orders of the defined types were placed in this encounter.      Return in about 3 months (around 12/19/2018) for S/P laser retinopexy (10.03.19), DFE, OCT.  There are no Patient Instructions on file for this visit.   Explained the diagnoses, plan, and follow up with the patient and they expressed understanding.  Patient expressed understanding of the importance of proper follow up care.   This document serves as a record of services personally performed by Gardiner Sleeper, MD, PhD. It was created on their behalf by Ernest Mallick, OA, an ophthalmic assistant. The creation of this record is the provider's dictation and/or activities during the visit.    Electronically signed by: Ernest Mallick, OA  10.23.19 11:28 AM    Gardiner Sleeper, M.D., Ph.D. Diseases & Surgery of the Retina and Vitreous [facility]   I have reviewed the above documentation for accuracy and completeness, and I agree with the above. Gardiner Sleeper, M.D., Ph.D. 09/18/18 11:28 AM    Abbreviations: M myopia (nearsighted); A astigmatism; H hyperopia (farsighted); P presbyopia; Mrx spectacle prescription;  CTL contact lenses; OD right eye; OS left eye; OU both eyes  XT exotropia; ET esotropia; PEK punctate epithelial keratitis; PEE punctate epithelial erosions; DES dry eye syndrome; MGD meibomian gland dysfunction; ATs artificial tears; PFAT's preservative free artificial tears; [place] nuclear sclerotic cataract; PSC posterior subcapsular cataract; ERM epi-retinal membrane; PVD posterior vitreous detachment; RD retinal detachment; DM diabetes mellitus; DR diabetic retinopathy; NPDR non-proliferative diabetic retinopathy; PDR proliferative diabetic retinopathy; CSME clinically significant macular edema; DME diabetic macular edema; dbh dot blot hemorrhages; CWS cotton wool spot;  POAG primary open angle glaucoma; C/D cup-to-disc ratio; HVF humphrey visual field; GVF goldmann visual field; OCT optical coherence tomography; IOP intraocular pressure; BRVO Branch retinal vein occlusion; CRVO central retinal vein occlusion; CRAO central retinal artery occlusion; BRAO branch retinal artery occlusion; RT retinal tear; SB scleral buckle; PPV pars plana vitrectomy; VH Vitreous hemorrhage; PRP panretinal laser photocoagulation; IVK intravitreal kenalog; VMT vitreomacular traction; MH Macular hole;  NVD neovascularization of the disc; NVE neovascularization elsewhere; AREDS age related eye disease study; ARMD age related macular degeneration; POAG primary open angle glaucoma; EBMD epithelial/anterior basement membrane dystrophy; ACIOL anterior chamber intraocular lens; IOL intraocular lens; PCIOL posterior chamber intraocular lens; Phaco/IOL phacoemulsification with intraocular lens placement; [place] photorefractive keratectomy; LASIK laser assisted in situ keratomileusis; HTN hypertension; DM diabetes mellitus; COPD chronic obstructive pulmonary disease

## 2018-09-17 NOTE — Telephone Encounter (Signed)
Patient states she received a letter from her insurance company stating they no longer cover her Methocarbamol. Patient states the letter did not include alternative medications. Patient states she spoke with the insurance company and they stated we may contact the insurance company and they may be able to "do a write on". Phone number [phone]. Option 2.

## 2018-09-17 NOTE — Telephone Encounter (Signed)
Patient left a voicemail stating her insurance company [employer] told her that the prescription of Methocarbamol is not a covered drug and to contact our office.  Patient is requesting we contact [place] so she can refill her prescription.

## 2018-09-18 ENCOUNTER — Ambulatory Visit (INDEPENDENT_AMBULATORY_CARE_PROVIDER_SITE_OTHER): Payer: Medicare HMO | Admitting: Ophthalmology

## 2018-09-18 ENCOUNTER — Encounter (INDEPENDENT_AMBULATORY_CARE_PROVIDER_SITE_OTHER): Payer: Self-pay | Admitting: Ophthalmology

## 2018-09-18 DIAGNOSIS — H353131 Nonexudative age-related macular degeneration, bilateral, early dry stage: Secondary | ICD-10-CM

## 2018-09-18 DIAGNOSIS — H3581 Retinal edema: Secondary | ICD-10-CM

## 2018-09-18 DIAGNOSIS — H33331 Multiple defects of retina without detachment, right eye: Secondary | ICD-10-CM

## 2018-09-18 DIAGNOSIS — H43811 Vitreous degeneration, right eye: Secondary | ICD-10-CM

## 2018-09-18 DIAGNOSIS — H25813 Combined forms of age-related cataract, bilateral: Secondary | ICD-10-CM

## 2018-09-18 DIAGNOSIS — D3132 Benign neoplasm of left choroid: Secondary | ICD-10-CM

## 2018-09-19 ENCOUNTER — Ambulatory Visit (INDEPENDENT_AMBULATORY_CARE_PROVIDER_SITE_OTHER): Payer: Medicare HMO | Admitting: Pulmonary Disease

## 2018-09-19 DIAGNOSIS — R0600 Dyspnea, unspecified: Secondary | ICD-10-CM

## 2018-09-19 LAB — PULMONARY FUNCTION TEST
DL/VA % pred: 121 %
DL/VA: 5.52 ml/min/mmHg/L
DLCO cor % pred: 86 %
DLCO cor: 18.73 ml/min/mmHg
DLCO unc % pred: 83 %
DLCO unc: 18.07 ml/min/mmHg
FEF 25-75 Post: 1.6 L/sec
FEF 25-75 Pre: 1.54 L/sec
FEF2575-%Change-Post: 3 %
FEF2575-%Pred-Post: 72 %
FEF2575-%Pred-Pre: 70 %
FEV1-%Change-Post: 0 %
FEV1-%Pred-Post: 74 %
FEV1-%Pred-Pre: 75 %
FEV1-Post: 1.75 L
FEV1-Pre: 1.77 L
FEV1FVC-%Change-Post: 5 %
FEV1FVC-%Pred-Pre: 102 %
FEV6-%Change-Post: -6 %
FEV6-%Pred-Post: 70 %
FEV6-%Pred-Pre: 75 %
FEV6-Post: 2.07 L
FEV6-Pre: 2.21 L
FEV6FVC-%Pred-Post: 103 %
FEV6FVC-%Pred-Pre: 103 %
FVC-%Change-Post: -6 %
FVC-%Pred-Post: 68 %
FVC-%Pred-Pre: 72 %
FVC-Post: 2.07 L
FVC-Pre: 2.21 L
Post FEV1/FVC ratio: 85 %
Post FEV6/FVC ratio: 100 %
Pre FEV1/FVC ratio: 80 %
Pre FEV6/FVC Ratio: 100 %
RV % pred: 76 %
RV: 1.47 L
TLC % pred: 75 %
TLC: 3.6 L

## 2018-09-19 NOTE — Progress Notes (Signed)
PFT completed today.  

## 2018-09-24 ENCOUNTER — Encounter: Payer: Self-pay | Admitting: Pulmonary Disease

## 2018-09-24 ENCOUNTER — Ambulatory Visit: Payer: Medicare HMO | Admitting: Pulmonary Disease

## 2018-09-24 VITALS — BP 126/74 | HR 80 | Ht 62.0 in | Wt 233.0 lb

## 2018-09-24 DIAGNOSIS — R29898 Other symptoms and signs involving the musculoskeletal system: Secondary | ICD-10-CM | POA: Diagnosis not present

## 2018-09-24 DIAGNOSIS — Z6841 Body Mass Index (BMI) 40.0 and over, adult: Secondary | ICD-10-CM | POA: Diagnosis not present

## 2018-09-24 DIAGNOSIS — R0602 Shortness of breath: Secondary | ICD-10-CM

## 2018-09-24 NOTE — Telephone Encounter (Signed)
Spoke with [employer] and did prior authorization over the phone. It has been approved from December 2018-December 2020. Advised patient.

## 2018-09-24 NOTE — Patient Instructions (Signed)
Can try stopping Qvar  Follow up in 6 months

## 2018-09-24 NOTE — Progress Notes (Signed)
[facility]  Chief Complaint  Patient presents with  . Establish Care    Former SN pt. Switching to VS for history of dyspnea. States since her last visit with EW, she is still having the SOB at random times. Still using the Qvar inhaler.  Has not noticed a difference.     Constitutional:  BP 126/74 (BP Location: Left Arm, Patient Position: Sitting, Cuff Size: Normal)   Pulse 80   Ht 5\' 2"  (1.575 m)   Wt 233 lb (105.7 kg)   LMP 07/27/2006   SpO2 97%   BMI 42.62 kg/m   Past Medical History:  Anxiety, C diff 2013, Chronic fatigue, Depression, Fibromyalgia, Gastroparesis, GERD, HLD, Nephrolithiasis, Migraines, Neuropathy, RLS, Tremor, Obstructive sleep apnea  Brief Summary:  [name] is a 62 y.o. female with dyspnea.  She was previously seen by Dr. Lenna Gilford.  She had Echo, PFT, cardiac cath, CT chest which were all unremarkable.  Recent lab testing didn't show anything to explain her symptoms of dyspnea.  She had problems with arthritis in her knee and had surgery for this.  She is still working with physical therapy for this.  She is very busy during the day, but doesn't typically have sustained cardiovascular activity.  She is not having chest pain, cough, wheeze, sputum, palpitations.  She has been using Qvar.  Not sure if this is helping.   Physical Exam:   Appearance - well kempt   ENMT - clear nasal mucosa, midline nasal  septum, no oral exudates, no LAN, trachea midline  Respiratory - normal chest wall, normal respiratory effort, no accessory muscle use, no wheeze/rales  CV - s1s2 regular rate and rhythm, no murmurs, no peripheral edema, radial pulses symmetric  GI - soft, non tender, no masses  Lymph - no adenopathy noted in neck and axillary areas  MSK - normal gait  Ext - no cyanosis, clubbing, or joint inflammation noted  Skin - no rashes, lesions, or ulcers  Neuro - normal strength, oriented x 3  Psych - normal  mood and affect  Hb 12.3 from 08/27/18.     Assessment/Plan:   Dyspnea on exertion. - she has undergone extensive testing of her cardiac and pulmonary function, and results have been unrevealing for a specific cause - her symptoms are most likely related to obesity and deconditioning - discussed importance of regular exercise program to gradually build up her stamina - it is unclear whether she has a component of asthma and she hasn't noticed an improvement with Qvar; advised her to stop this and monitor her symptoms  Obstructive sleep apnea. - she is followed by Dr. Radford Pax with cardiology    Patient Instructions  Can try stopping Qvar  Follow up in 6 months  Time spent 27 minutes  Chesley Mires, MD [place] Pager: [phone] 09/24/2018, 12:32 PM  Flow Sheet     Pulmonary tests:  CT angio chest 08/28/18 >> scarring Rt lung (reviewed by me) PFT 09/19/18 >> FEV1 1.77 (75%), FEV1% 80, TLC 3.60 (75%), DLCO 83%  Sleep tests:  PSG 01/31/07 >> AHI 21.9, 80%  Cardiac tests:  Echo 12/07/17 >> mild LVH, EF 60 to 65%, grade 1 DD RHC/LHC 01/01/18 >> normal coronaries, EF 65%, mild elevated PAS   Medications:   Allergies as of 09/24/2018      Reactions   Erythromycin Nausea Only   Abdominal pain   Tramadol Itching   Dilaudid [hydromorphone Hcl] Itching  Medication List        Accurate as of 09/24/18 12:32 PM. Always use your most recent med list.          acetaminophen 500 MG tablet Commonly known as:  TYLENOL Take 1,000 mg by mouth 3 (three) times daily as needed for moderate pain.   albuterol 108 (90 Base) MCG/ACT inhaler Commonly known as:  PROVENTIL HFA;VENTOLIN HFA Inhale 1-2 puffs into the lungs every 6 (six) hours as needed for wheezing or shortness of breath.   allopurinol 300 MG tablet Commonly known as:  ZYLOPRIM TAKE 1 TABLET (300 MG TOTAL) DAILY BY MOUTH.   ALPRAZolam 0.5 MG tablet Commonly known as:  XANAX Take 0.5  mg by mouth 3 (three) times daily as needed for anxiety.   ARIPiprazole 5 MG tablet Commonly known as:  ABILIFY Take 5 mg by mouth daily.   Armodafinil 250 MG tablet TAKE 1 TABLET EVERY DAY WITH BREAKFAST   aspirin 81 MG tablet aspirin 81 mg tablet,delayed release  TAKE 1 TABLET BY MOUTH EVERY DAY   atorvastatin 40 MG tablet Commonly known as:  LIPITOR TAKE 1 TABLET BY MOUTH EVERY DAY   cetirizine 10 MG tablet Commonly known as:  ZYRTEC Take 10 mg by mouth at bedtime.   cholecalciferol 1000 units tablet Commonly known as:  VITAMIN D Take 1,000 Units by mouth daily.   colchicine 0.6 MG tablet Take 2 tablets by mouth at onset of gout flare. Taken an additional tablet 1 hour later, if needed. May take like this every 3d, if needed   DULoxetine 60 MG capsule Commonly known as:  CYMBALTA Take 60 mg by mouth 2 (two) times daily.   Fish Oil 1200 MG Caps Take 1,200 mg by mouth daily.   fluticasone 50 MCG/ACT nasal spray Commonly known as:  FLONASE PLACE 2 SPRAYS INTO BOTH NOSTRILS DAILY AS NEEDED FOR ALLERGIES   gabapentin 300 MG capsule Commonly known as:  NEURONTIN Take 1 capsule (300 mg total) by mouth 3 (three) times daily. Gabapentin 300 mg Protocol Take a 300 mg capsule three times a day for two weeks following surgery. Then take a 300 mg capsule two times a day for two weeks.  Then resume a 300 mg capsule once a day at bedtime.   ICAPS AREDS 2 Caps Take 1 capsule by mouth 2 (two) times daily.   Melatonin 10 MG Tabs Take 10 mg by mouth at bedtime.   meloxicam 15 MG tablet Commonly known as:  MOBIC Take 15 mg by mouth daily.   metoprolol tartrate 50 MG tablet Commonly known as:  LOPRESSOR Take 1 tablet (50 mg total) by mouth 2 (two) times daily.   nortriptyline 25 MG capsule Commonly known as:  PAMELOR Take 75 mg by mouth at bedtime.   ondansetron 8 MG tablet Commonly known as:  ZOFRAN Take 8 mg by mouth every 8 (eight) hours as needed for nausea.     pantoprazole 40 MG tablet Commonly known as:  PROTONIX Take 40 mg by mouth 2 (two) times daily.   PHILLIPS COLON HEALTH Caps Take 1 capsule by mouth at bedtime.   prochlorperazine 10 MG tablet Commonly known as:  COMPAZINE Take 10 mg by mouth every 6 (six) hours as needed for nausea or vomiting.   ranitidine 300 MG tablet Commonly known as:  ZANTAC Take 300 mg by mouth 2 (two) times daily.   SYSTANE COMPLETE 0.6 % Soln Generic drug:  Propylene Glycol Place 1 drop into both eyes  2 (two) times daily.   topiramate 25 MG tablet Commonly known as:  TOPAMAX TAKE 2 TABLETS (50 MG TOTAL) BY MOUTH AT BEDTIME.   vitamin B-12 1000 MCG tablet Commonly known as:  CYANOCOBALAMIN Take 1,000 mcg by mouth daily.       Past Surgical History:  She  has a past surgical history that includes Tonsillectomy (1968); Knee surgery (1999); Cholecystectomy (9/05); Endometrial ablation (08/09/2006); Tonsillectomy; Total knee arthroplasty (Right, 09/05/2015); Facet joint injection (04/17/2017); Knee Arthroplasty; RIGHT/LEFT HEART CATH AND CORONARY ANGIOGRAPHY (N/A, 01/01/2018); Ultrasound guidance for vascular access (01/01/2018); and Total knee arthroplasty (Left, 07/14/2018).  Family History:  Her family history includes Allergies in her mother and sister; Asthma in her sister; Cancer in her maternal aunt and maternal aunt; Colon cancer in her maternal aunt; Diabetes in her father; Heart disease in her father, mother, paternal grandfather, and paternal grandmother; Hypertension in her father and mother; Irritable bowel syndrome in her sister; Kidney disease in her father; Macular degeneration in her mother; Stroke (age of onset: 68) in her father.  Social History:  She  reports that she has never smoked. She has never used smokeless tobacco. She reports that she does not drink alcohol or use drugs.

## 2018-09-29 ENCOUNTER — Other Ambulatory Visit: Payer: Self-pay | Admitting: Family Medicine

## 2018-10-02 ENCOUNTER — Telehealth: Payer: Self-pay | Admitting: Family Medicine

## 2018-10-02 DIAGNOSIS — E782 Mixed hyperlipidemia: Secondary | ICD-10-CM

## 2018-10-02 DIAGNOSIS — M109 Gout, unspecified: Secondary | ICD-10-CM

## 2018-10-02 DIAGNOSIS — R7303 Prediabetes: Secondary | ICD-10-CM

## 2018-10-02 DIAGNOSIS — Z5181 Encounter for therapeutic drug level monitoring: Secondary | ICD-10-CM

## 2018-10-02 DIAGNOSIS — Z0000 Encounter for general adult medical examination without abnormal findings: Secondary | ICD-10-CM

## 2018-10-02 NOTE — Telephone Encounter (Signed)
I entered future orders. Please schedule for fasting labs.

## 2018-10-02 NOTE — Telephone Encounter (Signed)
Pt has appt 11/18 and wants to know if she needs to come in prior for any labs?

## 2018-10-08 NOTE — Telephone Encounter (Signed)
appt made

## 2018-10-09 ENCOUNTER — Other Ambulatory Visit: Payer: Medicare HMO

## 2018-10-09 DIAGNOSIS — E782 Mixed hyperlipidemia: Secondary | ICD-10-CM

## 2018-10-09 DIAGNOSIS — Z5181 Encounter for therapeutic drug level monitoring: Secondary | ICD-10-CM | POA: Diagnosis not present

## 2018-10-09 DIAGNOSIS — R7303 Prediabetes: Secondary | ICD-10-CM

## 2018-10-09 DIAGNOSIS — M109 Gout, unspecified: Secondary | ICD-10-CM | POA: Diagnosis not present

## 2018-10-09 DIAGNOSIS — Z0000 Encounter for general adult medical examination without abnormal findings: Secondary | ICD-10-CM | POA: Diagnosis not present

## 2018-10-10 LAB — LIPID PANEL
Chol/HDL Ratio: 3 ratio (ref 0.0–4.4)
Cholesterol, Total: 128 mg/dL (ref 100–199)
HDL: 42 mg/dL (ref >39)
LDL Calculated: 62 mg/dL (ref 0–99)
Triglycerides: 119 mg/dL (ref 0–149)
VLDL Cholesterol Cal: 24 mg/dL (ref 5–40)

## 2018-10-10 LAB — HEMOGLOBIN A1C
Est. average glucose Bld gHb Est-mCnc: 134 mg/dL
Hgb A1c MFr Bld: 6.3 % — ABNORMAL HIGH (ref 4.8–5.6)

## 2018-10-10 LAB — GLUCOSE, RANDOM: Glucose: 96 mg/dL (ref 65–99)

## 2018-10-10 LAB — URIC ACID: Uric Acid: 4.7 mg/dL (ref 2.5–7.1)

## 2018-10-12 ENCOUNTER — Encounter: Payer: Self-pay | Admitting: Family Medicine

## 2018-10-12 NOTE — Progress Notes (Signed)
Chief Complaint  Patient presents with  . Hyperlipidemia    nonfasting med check. Did want to discuss something for sleep.   . Nasal Congestion    that just started today, mucus is clear, no fever. No chills or body aches, just feels bad.     Patient presents for med check (6 month f/u from her physical), having seen multiple providers over the last couple of months. She had labs prior to today's visit, see below.  Chief complaints today are insomnia, as well as some nasal stuffiness/congestion that just started today.  She reports not being able to sleep more than 4 hours, sometimes up to 6-7 hrs.  She has trouble falling asleep. She wakes up to go to the bathroom and sometimes has trouble getting back to sleep. She takes melatonin, but it doesn't seem to help. She states she asked Dr. Radford Pax about it (sees for sleep apnea), who referred her to discuss with PCP.  Denies napping (except sleeping on car rides).  She has OSA, under the care of Dr. Radford Pax. She tries to wear her CPAP, but reports it doesn't stay on.  She is supposed to pick up supplies, and plans to talk to them about it. She knows she needs to wear it for at least 4 hrs/night, which apparently is contributing to some of her anxiety about sleep.  Pre-diabetes and obesity:  She has seen [facility], considering bariatric surgery, and seeing nutritionist (required as part of eval). She has already met with psychologist. Trying to work on exercise and diet.  Previously declined Metformin trial.  Hyperlipidemia follow-up: Patient is trying to follow a low-fat, low cholesterol diet as best she can. Compliant with medications and denies medication side effects. She is back on fish oil, 1 daily.  She saw neurologist (Dr. Valaria Good) last month. She is back on gabapentin 300 mg 3 times daily, helping with restless legs as well as her neuropathy pain.  She sees him for migraines, tremor, neuropathy and restless  legs. Migraines:Takes topamax and pamelor for prevention. Gets migraines every 2 months, last was 3 weeks ago.  They relieved by OTC medication most of the time.  Fibromyalgia: under the care of Dr. Estanislado Pandy. She prescribes armodafinil(Nuvigil)for chronic fatigue. Stable overall.  Depression:Sees Rollene Fare (at Dr. Arvil Persons prior practice), doing "pretty good".  No medication changes. Insomnia per above.  Hypertension: (never formally diagnosed, because hadbeen on diuretics for fluid retention (stopped a while ago) and beta blockers for tachycardia and tremor--but high BP's noted when not taking these medications in past). She periodically checks her BP at home, running 125/70's up to 130 sometimes. Denies chest pain, palpitations/tachycardia.    Gout: She continues on 38m of allopurinol and denies any gout flares.  She has had dyspnea on exertion since her knee surgery in August. It is episodic. She has seen cardiologist and pulmonologist.  Recently saw Dr. SHalford Chessman who felt that, given that extensive testing was unrevealing for a cause (cardiac/pulmonary), symptoms may be related to obesity and deconditioning.  She hadn't noticed improvement on Qvar, so it was just recently stopped. She completed PT for her knee, but thinks she didn't work as hard (due to concerns about her breathing).  She has some persistent pain in the knee, doesn't think surgery was as effective as her other knee. Not having the dyspnea nearly as often as originally.  Hasn't yet noticed any worsening since stopping the Qvar. Still can't walk much in the neighborhood  Saw GYN 10/15 for  routine exam.  GERD and gastroparesis is stable.  S/p laser eye surgery on right eye for retinal tears on 08/28/18. She reports her vision is back to baseline.   PMH, PSH, SH reviewed  Outpatient Encounter Medications as of 10/13/2018  Medication Sig Note  . allopurinol (ZYLOPRIM) 300 MG tablet TAKE 1 TABLET (300 MG TOTAL) DAILY  BY MOUTH.   Marland Kitchen ALPRAZolam (XANAX) 0.5 MG tablet Take 0.5 mg by mouth 3 (three) times daily as needed for anxiety. 10/13/2018: Uses prn, about 2-3x/week  . ARIPiprazole (ABILIFY) 5 MG tablet Take 5 mg by mouth daily.   . Armodafinil 250 MG tablet TAKE 1 TABLET EVERY DAY WITH BREAKFAST   . aspirin 81 MG tablet aspirin 81 mg tablet,delayed release  TAKE 1 TABLET BY MOUTH EVERY DAY   . atorvastatin (LIPITOR) 40 MG tablet TAKE 1 TABLET BY MOUTH EVERY DAY   . cetirizine (ZYRTEC) 10 MG tablet Take 10 mg by mouth at bedtime.   . cholecalciferol (VITAMIN D) 1000 units tablet Take 1,000 Units by mouth daily.   . DULoxetine (CYMBALTA) 60 MG capsule Take 60 mg by mouth 2 (two) times daily.    Marland Kitchen gabapentin (NEURONTIN) 300 MG capsule Take 1 capsule (300 mg total) by mouth 3 (three) times daily. Gabapentin 300 mg Protocol Take a 300 mg capsule three times a day for two weeks following surgery. Then take a 300 mg capsule two times a day for two weeks.  Then resume a 300 mg capsule once a day at bedtime. 10/13/2018: Taking 338m TID  . Melatonin 10 MG TABS Take 10 mg by mouth at bedtime.   . metoprolol tartrate (LOPRESSOR) 50 MG tablet Take 1 tablet (50 mg total) by mouth 2 (two) times daily.   . Multiple Vitamins-Minerals (ICAPS AREDS 2) CAPS Take 1 capsule by mouth 2 (two) times daily.   . nortriptyline (PAMELOR) 25 MG capsule Take 75 mg by mouth at bedtime.    . Omega-3 Fatty Acids (FISH OIL) 1200 MG CAPS Take 1,200 mg by mouth daily.   . pantoprazole (PROTONIX) 40 MG tablet Take 40 mg by mouth 2 (two) times daily.   . Probiotic Product (PDelphos CAPS Take 1 capsule by mouth at bedtime.    .Marland KitchenPropylene Glycol (SYSTANE COMPLETE) 0.6 % SOLN Place 1 drop into both eyes 2 (two) times daily.   . ranitidine (ZANTAC) 300 MG tablet Take 300 mg by mouth 2 (two) times daily.    .Marland Kitchentopiramate (TOPAMAX) 25 MG tablet TAKE 2 TABLETS (50 MG TOTAL) BY MOUTH AT BEDTIME.   . vitamin B-12 (CYANOCOBALAMIN) 1000 MCG  tablet Take 1,000 mcg by mouth daily.   .Marland Kitchenacetaminophen (TYLENOL) 500 MG tablet Take 1,000 mg by mouth 3 (three) times daily as needed for moderate pain.   .Marland Kitchenalbuterol (PROVENTIL HFA;VENTOLIN HFA) 108 (90 Base) MCG/ACT inhaler Inhale 1-2 puffs into the lungs every 6 (six) hours as needed for wheezing or shortness of breath. (Patient not taking: Reported on 10/13/2018)   . colchicine 0.6 MG tablet Take 2 tablets by mouth at onset of gout flare. Taken an additional tablet 1 hour later, if needed. May take like this every 3d, if needed (Patient not taking: Reported on 10/13/2018)   . fluticasone (FLONASE) 50 MCG/ACT nasal spray PLACE 2 SPRAYS INTO BOTH NOSTRILS DAILY AS NEEDED FOR ALLERGIES (Patient not taking: No sig reported)   . meloxicam (MOBIC) 15 MG tablet Take 15 mg by mouth daily. 10/13/2018: Uses prn (if tylenol  doesn't help)  . ondansetron (ZOFRAN) 8 MG tablet Take 8 mg by mouth every 8 (eight) hours as needed for nausea.    . prochlorperazine (COMPAZINE) 10 MG tablet Take 10 mg by mouth every 6 (six) hours as needed for nausea or vomiting.    . [DISCONTINUED] carbamazepine (TEGRETOL XR) 100 MG 12 hr tablet Take 200 mg by mouth daily.     . [DISCONTINUED] Eszopiclone 3 MG TABS Take 3 mg by mouth at bedtime. Take immediately before bedtime   . [DISCONTINUED] fluticasone (FLONASE) 50 MCG/ACT nasal spray PLACE 2 SPRAYS INTO BOTH NOSTRILS DAILY AS NEEDED FOR ALLERGIES    No facility-administered encounter medications on file as of 10/13/2018.    Allergies  Allergen Reactions  . Erythromycin Nausea Only    Abdominal pain  . Tramadol Itching  . Dilaudid [Hydromorphone Hcl] Itching   ROS: no fever, chills. Some nasal congestion x 1d, per HPI.  No sinus pain, headaches, dizziness.  Intermittent nausea, no vomiting. No bowel changes.  No urinary complaints, chest pain, palpitations.  Dyspnea per HPI, decreasing frequency of episodes. No edema.  RLS improved, tremor controlled. Depression  controlled.  +insomnia   PHYSICAL EXAM:  BP 126/82   Pulse 76   Temp 98.8 F (37.1 C) (Tympanic)   Ht '5\' 2"'  (1.575 m)   Wt 223 lb 6.4 oz (101.3 kg)   LMP 07/27/2006   BMI 40.86 kg/m    Wt Readings from Last 3 Encounters:  09/24/18 233 lb (105.7 kg)  09/15/18 223 lb 4.8 oz (101.3 kg)  09/09/18 224 lb (101.6 kg)   Weight at physical in May was 221# 9.6 oz  Pleasant, obese female, nasally congested, otherwise in good spirits, in no distress HEENT: conjunctiva and sclera are clear, EOMI. Nasal mucosa has mod edema, clear mucus on the right, yellow crusting on the left. OP is clear. Sinuses are nontender Neck: no lymphadenopathy or mass Heart: regular rate and rhythm Lungs: clear bilaterally, no wheezes, rales, ronchi Abdomen: soft, nontender. No organomegaly or mass Extremities: no edema, normal pulses Skin: normal turgor, no rash Psych: normal mood, affect, hygiene and grooming Neuro: alert and oriented, cranial nerves intact. Normal gait. No significant tremor noted.  Lab Results  Component Value Date   HGBA1C 6.3 (H) 10/09/2018   Fasting glucose 96  Lab Results  Component Value Date   LABURIC 4.7 10/09/2018   Lab Results  Component Value Date   CHOL 128 10/09/2018   HDL 42 10/09/2018   LDLCALC 62 10/09/2018   TRIG 119 10/09/2018   CHOLHDL 3.0 10/09/2018     ASSESSMENT/PLAN:  Pre-diabetes - A1c is a little higher; Cont seeing nutritionist. Daily exercise encouraged, cut back on carbs and work on weight loss  Mixed hyperlipidemia - at goal on current regimen.   Fibromyalgia - stable, per Dr. Estanislado Pandy  OSA (obstructive sleep apnea) - needs to f/u re: trouble with mask staying on  Idiopathic peripheral neuropathy - improved since gabapentin restarted by neuro  Migraine without status migrainosus, not intractable, unspecified migraine type - doing well on preventative med regimen currently taking  Acute gout of right foot, unspecified cause - uric acid  at goal, no flares; continue allopurinol  Gastroesophageal reflux disease, esophagitis presence not specified - and gastroparesis; controlled on current regimen per GI  Dyspnea on exertion - decreasing in frequency. Encouraged to work on conditioning/exercise indoors to gradually improve endurance  Class 3 severe obesity due to excess calories with serious comorbidity and body mass  index (BMI) of 40.0 to 44.9 in adult [facility]) - seeing nutritionist as part of bariatric surgical eval. cont the course of treatment  Insomnia, unspecified type - multifactorial, in a pt on MANY medications that should be sedating. I do NOT rec additional meds for fear of polypharmacy/oversedation. counseled re: non-med t   Over 40 min visit, more than 1/2 spent counseling (re: weight, knee, exercise, proper diet, insomnia--risks of meds, sleep hygiene, sleep apnea, etc).  Patient asked if she could get rid of a lot of her medications if she had bariatric surgery. I openly discussed with her that many of her medications are related to conditions that may not improve with weight loss (fibromyalgia, depression, neuropathy, migraines, etc).  We discussed that reason for surgery is not to eliminate medications, but eliminate the risks that come from obesity.  Sleep apnea likely will improve, pain in LE's will improve, BP's and sugars can improve (but she isn't really taking meds for this).  Discussed obesity risks, and why she should consider the surgery, but NOT to use goal of getting "off" most of her meds as the treatment goal, which is not realistic. She appreciated the honesty, as this isn't what she heard from other people.    I don't want to prescribe anything for sleep, since you are on so many potentially sedating medications.  I'm concerned that perhaps the Nuvigil should be addressed, as to whether that can worsen your sleep. Read the info provided on sleep hygiene. Use of CPAP regularly and getting regular  exercise, and avoiding naps are all very important in getting a good sleep routine. Try some meditation/mindfulness/relaxation techniques to help you sleep.  Continue to work with the nutritionist on your diet. Your A1c went up some. Continue to try and get 30 minutes a day of exercise, even if it is just 10 minutes at a time. Continue your current medications. Use saline spray to help with your nasal stuffiness. Consider sinus rinses also.

## 2018-10-13 ENCOUNTER — Ambulatory Visit (INDEPENDENT_AMBULATORY_CARE_PROVIDER_SITE_OTHER): Payer: Medicare HMO | Admitting: Family Medicine

## 2018-10-13 ENCOUNTER — Encounter: Payer: Self-pay | Admitting: Family Medicine

## 2018-10-13 VITALS — BP 126/82 | HR 76 | Temp 98.8°F | Ht 62.0 in | Wt 223.4 lb

## 2018-10-13 DIAGNOSIS — M797 Fibromyalgia: Secondary | ICD-10-CM | POA: Diagnosis not present

## 2018-10-13 DIAGNOSIS — G609 Hereditary and idiopathic neuropathy, unspecified: Secondary | ICD-10-CM

## 2018-10-13 DIAGNOSIS — K219 Gastro-esophageal reflux disease without esophagitis: Secondary | ICD-10-CM | POA: Diagnosis not present

## 2018-10-13 DIAGNOSIS — E782 Mixed hyperlipidemia: Secondary | ICD-10-CM | POA: Diagnosis not present

## 2018-10-13 DIAGNOSIS — G43909 Migraine, unspecified, not intractable, without status migrainosus: Secondary | ICD-10-CM | POA: Diagnosis not present

## 2018-10-13 DIAGNOSIS — M109 Gout, unspecified: Secondary | ICD-10-CM | POA: Diagnosis not present

## 2018-10-13 DIAGNOSIS — R7303 Prediabetes: Secondary | ICD-10-CM

## 2018-10-13 DIAGNOSIS — G4733 Obstructive sleep apnea (adult) (pediatric): Secondary | ICD-10-CM

## 2018-10-13 DIAGNOSIS — R0600 Dyspnea, unspecified: Secondary | ICD-10-CM

## 2018-10-13 DIAGNOSIS — G4700 Insomnia, unspecified: Secondary | ICD-10-CM

## 2018-10-13 DIAGNOSIS — Z6841 Body Mass Index (BMI) 40.0 and over, adult: Secondary | ICD-10-CM

## 2018-10-13 DIAGNOSIS — R0609 Other forms of dyspnea: Secondary | ICD-10-CM

## 2018-10-13 NOTE — Patient Instructions (Signed)
I don't want to prescribe anything for sleep, since you are on so many potentially sedating medications.  I'm concerned that perhaps the Nuvigil should be addressed, as to whether that can worsen your sleep. Read the info provided on sleep hygiene. Use of CPAP regularly and getting regular exercise, and avoiding naps are all very important in getting a good sleep routine. Try some meditation/mindfulness/relaxation techniques to help you sleep.   Insomnia Insomnia is a sleep disorder that makes it difficult to fall asleep or to stay asleep. Insomnia can cause tiredness (fatigue), low energy, difficulty concentrating, mood swings, and poor performance at work or school. There are three different ways to classify insomnia:  Difficulty falling asleep.  Difficulty staying asleep.  Waking up too early in the morning.  Any type of insomnia can be long-term (chronic) or short-term (acute). Both are common. Short-term insomnia usually lasts for three months or less. Chronic insomnia occurs at least three times a week for longer than three months. What are the causes? Insomnia may be caused by another condition, situation, or substance, such as:  Anxiety.  Certain medicines.  Gastroesophageal reflux disease (GERD) or other gastrointestinal conditions.  Asthma or other breathing conditions.  Restless legs syndrome, sleep apnea, or other sleep disorders.  Chronic pain.  Menopause. This may include hot flashes.  Stroke.  Abuse of alcohol, tobacco, or illegal drugs.  Depression.  Caffeine.  Neurological disorders, such as Alzheimer disease.  An overactive thyroid (hyperthyroidism).  The cause of insomnia may not be known. What increases the risk? Risk factors for insomnia include:  Gender. Women are more commonly affected than men.  Age. Insomnia is more common as you get older.  Stress. This may involve your professional or personal life.  Income. Insomnia is more  common in people with lower income.  Lack of exercise.  Irregular work schedule or night shifts.  Traveling between different time zones.  What are the signs or symptoms? If you have insomnia, trouble falling asleep or trouble staying asleep is the main symptom. This may lead to other symptoms, such as:  Feeling fatigued.  Feeling nervous about going to sleep.  Not feeling rested in the morning.  Having trouble concentrating.  Feeling irritable, anxious, or depressed.  How is this treated? Treatment for insomnia depends on the cause. If your insomnia is caused by an underlying condition, treatment will focus on addressing the condition. Treatment may also include:  Medicines to help you sleep.  Counseling or therapy.  Lifestyle adjustments.  Follow these instructions at home:  Take medicines only as directed by your health care provider.  Keep regular sleeping and waking hours. Avoid naps.  Keep a sleep diary to help you and your health care provider figure out what could be causing your insomnia. Include: ? When you sleep. ? When you wake up during the night. ? How well you sleep. ? How rested you feel the next day. ? Any side effects of medicines you are taking. ? What you eat and drink.  Make your bedroom a comfortable place where it is easy to fall asleep: ? Put up shades or special blackout curtains to block light from outside. ? Use a white noise machine to block noise. ? Keep the temperature cool.  Exercise regularly as directed by your health care provider. Avoid exercising right before bedtime.  Use relaxation techniques to manage stress. Ask your health care provider to suggest some techniques that may work well for you. These may include: ?  Breathing exercises. ? Routines to release muscle tension. ? Visualizing peaceful scenes.  Cut back on alcohol, caffeinated beverages, and cigarettes, especially close to bedtime. These can disrupt your  sleep.  Do not overeat or eat spicy foods right before bedtime. This can lead to digestive discomfort that can make it hard for you to sleep.  Limit screen use before bedtime. This includes: ? Watching TV. ? Using your smartphone, tablet, and computer.  Stick to a routine. This can help you fall asleep faster. Try to do a quiet activity, brush your teeth, and go to bed at the same time each night.  Get out of bed if you are still awake after 15 minutes of trying to sleep. Keep the lights down, but try reading or doing a quiet activity. When you feel sleepy, go back to bed.  Make sure that you drive carefully. Avoid driving if you feel very sleepy.  Keep all follow-up appointments as directed by your health care provider. This is important. Contact a health care provider if:  You are tired throughout the day or have trouble in your daily routine due to sleepiness.  You continue to have sleep problems or your sleep problems get worse. Get help right away if:  You have serious thoughts about hurting yourself or someone else. This information is not intended to replace advice given to you by your health care provider. Make sure you discuss any questions you have with your health care provider. Document Released: 11/09/2000 Document Revised: 04/13/2016 Document Reviewed: 08/13/2014 Elsevier Interactive Patient Education  [employer].

## 2018-10-16 ENCOUNTER — Encounter: Payer: Self-pay | Admitting: Skilled Nursing Facility1

## 2018-10-16 ENCOUNTER — Encounter: Payer: Medicare HMO | Attending: Surgery | Admitting: Skilled Nursing Facility1

## 2018-10-16 DIAGNOSIS — Z8261 Family history of arthritis: Secondary | ICD-10-CM | POA: Diagnosis not present

## 2018-10-16 DIAGNOSIS — E669 Obesity, unspecified: Secondary | ICD-10-CM

## 2018-10-16 DIAGNOSIS — Z713 Dietary counseling and surveillance: Secondary | ICD-10-CM | POA: Insufficient documentation

## 2018-10-16 DIAGNOSIS — Z6841 Body Mass Index (BMI) 40.0 and over, adult: Secondary | ICD-10-CM | POA: Insufficient documentation

## 2018-10-16 DIAGNOSIS — Z8379 Family history of other diseases of the digestive system: Secondary | ICD-10-CM | POA: Insufficient documentation

## 2018-10-16 DIAGNOSIS — Z8249 Family history of ischemic heart disease and other diseases of the circulatory system: Secondary | ICD-10-CM | POA: Insufficient documentation

## 2018-10-16 DIAGNOSIS — Z905 Acquired absence of kidney: Secondary | ICD-10-CM | POA: Insufficient documentation

## 2018-10-16 DIAGNOSIS — Z823 Family history of stroke: Secondary | ICD-10-CM | POA: Diagnosis not present

## 2018-10-16 DIAGNOSIS — Z9049 Acquired absence of other specified parts of digestive tract: Secondary | ICD-10-CM | POA: Diagnosis not present

## 2018-10-16 DIAGNOSIS — Z818 Family history of other mental and behavioral disorders: Secondary | ICD-10-CM | POA: Diagnosis not present

## 2018-10-16 DIAGNOSIS — K219 Gastro-esophageal reflux disease without esophagitis: Secondary | ICD-10-CM | POA: Insufficient documentation

## 2018-10-16 DIAGNOSIS — Z841 Family history of disorders of kidney and ureter: Secondary | ICD-10-CM | POA: Diagnosis not present

## 2018-10-16 DIAGNOSIS — Z79899 Other long term (current) drug therapy: Secondary | ICD-10-CM | POA: Insufficient documentation

## 2018-10-16 DIAGNOSIS — Z888 Allergy status to other drugs, medicaments and biological substances status: Secondary | ICD-10-CM | POA: Insufficient documentation

## 2018-10-16 DIAGNOSIS — G4733 Obstructive sleep apnea (adult) (pediatric): Secondary | ICD-10-CM | POA: Diagnosis not present

## 2018-10-16 NOTE — Patient Instructions (Addendum)
-  Continue to chew until applesauce consistency   -Continue to not drink Gatorade and mountain dew  -Continue to work on eating vegetables every day  -Continue to not drink with meals  -Continue to work on your physical therapy

## 2018-10-16 NOTE — Progress Notes (Signed)
RYGB Assessment: 5th SWL Appointment.  Pt needs 6 visits.  Pt has gastroparesis and GERD.  Pt states she got knee surgery August 19th.  Pt states she does not like vegetables.  Pt arrives having gained about 3 pounds. Pt states she has been back and forth with the doctor due to her breathing and is still working on diagnosing the issue.   Pt arrives stating she has a cold and has a wet/productive cough. Pt arrives having lost about 2 pounds. Pt states she feels this last month went well sating she drinks Gatorade zero instead of regular Gatorade and only 1 mountain dew. Pt state she was able to talk herself out of drinking more mountain dew. Pt states she is still working on chewing her foods well. Pt states she is still working on eating vegetates every day. Pt states her husband always wants to eat out. Pt states she has had some stiffness in her knee she got the surgery on.  Pt states her psychologist (Dr. Ardath Sax) upset her due to his interpretation of her.   Start Wt at NDES: 225.8 Wt: 221 BMI: 40.91  MEDICATIONS: See List   DIETARY INTAKE:  24-hr recall: eating spoonfuls of peanut butter  B ( AM): rice crispie cereal with banana or yogurt or cheese toast Snk ( AM): 1/2 cookie or fruit or yogurt L ( PM): sandwich or salad or skipped Snk ( PM): banana or P3 or 100 calorie pack of nuts D ( PM): roast with potatoes and carrots or chicken pie and green bean  Snk ( PM): peanut butter or fruit or nuts Beverages: water, gatorade zero: 60+ounces   Usual physical activity: ADL's; PT exercises   Diet to Follow: 1500 calories 170 g carbohydrates 112 g protein 42 g fat   Nutritional Diagnosis:  -3.3 Overweight/obesity related to past poor dietary habits and physical inactivity as evidenced by patient w/ planned RYGB surgery following dietary guidelines for continued weight loss.    Intervention:  Nutrition counseling for upcoming Bariatric Surgery. Goals: -Encouraged to engage in  150 minutes of moderate physical activity including cardiovascular and weight baring weekly -Continue to chew until applesauce consistency  -Continue to not drink Gatorade and mountain dew -Continue to work on eating vegetables every day -Continue to not drink with meals -Continue to work on your physical therapy  Teaching Method Utilized:  S[employer] broker Hands on  Barriers to learning/adherence to lifestyle change: none identified   Demonstrated degree of understanding via:  Teach Back   Monitoring/Evaluation:  Dietary intake, exercise,and body weight prn.

## 2018-10-20 ENCOUNTER — Telehealth: Payer: Self-pay | Admitting: Family Medicine

## 2018-10-20 MED ORDER — AMOXICILLIN 500 MG PO TABS: 1000.0000 mg | ORAL_TABLET | Freq: Two times a day (BID) | ORAL | 0 refills | Status: DC

## 2018-10-20 NOTE — Telephone Encounter (Signed)
Pt called and states when she was in last, that she had a cold started that you said couldn't do much for at the time.  She has now gotten much worse.  Yellow thick mucous, coughing.  She is taking Mucinex and Delsym.  Please call her in something at [facility]

## 2018-10-20 NOTE — Telephone Encounter (Signed)
Advise pt amoxil called in. Cont mucinex, Delsym.

## 2018-10-20 NOTE — Telephone Encounter (Signed)
Patient advised.

## 2018-10-21 ENCOUNTER — Encounter: Payer: Self-pay | Admitting: Family Medicine

## 2018-10-27 DIAGNOSIS — M545 Low back pain: Secondary | ICD-10-CM | POA: Diagnosis not present

## 2018-10-28 NOTE — Progress Notes (Signed)
Office Visit Note  Patient: [name]             Date of Birth: 1956/05/13           MRN: [id number]             PCP: Rita Ohara, MD Referring: Rita Ohara, MD Visit Date: 11/11/2018 Occupation: @GUAROCC @  Subjective:  Generalized pain.   History of Present Illness: [name] is a 62 y.o. female with fibromyalgia osteoarthritis degenerative disc disease and gout.  She states she had 2 recent falls.  She twisted her right foot and had some chip fracture to her right ankle for which she has been seeing Dr. Maxie Better.  She also had another fall after which she started having increased lower back pain.  The pain is gradually getting better.  She denies any history of dizziness.  She continues to have some discomfort from underlying disc disease in her cervical spine and lumbar spine.  He had left total knee replacement in August.  She states she has been gradually recovering from that.  Activities of Daily Living:  Patient reports morning stiffness for 0 minutes.   Patient Reports nocturnal pain.  Difficulty dressing/grooming: Denies Difficulty climbing stairs: Reports Difficulty getting out of chair: Reports Difficulty using hands for taps, buttons, cutlery, and/or writing: Denies  Review of Systems  Constitutional: Positive for fatigue.  HENT: Positive for mouth dryness. Negative for mouth sores, trouble swallowing and trouble swallowing.   Eyes: Positive for dryness. Negative for pain, redness and itching.  Respiratory: Negative for difficulty breathing.        Uses inhaler   Cardiovascular: Negative for chest pain, palpitations and swelling in legs/feet.  Gastrointestinal: Positive for abdominal pain, constipation, diarrhea and nausea. Negative for blood in stool and vomiting.  Endocrine: Negative for increased urination.  Genitourinary: Negative for painful urination, nocturia and pelvic pain.  Musculoskeletal: Positive for arthralgias and joint pain. Negative for joint  swelling and morning stiffness.  Skin: Negative for rash and hair loss.  Allergic/Immunologic: Negative for susceptible to infections.  Neurological: Negative for dizziness, light-headedness, headaches, memory loss and weakness.  Hematological: Negative for bruising/bleeding tendency.  Psychiatric/Behavioral: Negative for confusion. The patient is not nervous/anxious.     PMFS History:  Patient Active Problem List   Diagnosis Date Noted  . Dyspnea 12/26/2017  . Restrictive lung disease secondary to obesity 12/26/2017  . Atypical chest pain 12/06/2017  . Sinus tachycardia 12/06/2017  . Chest pain 12/06/2017  . DJD (degenerative joint disease), cervical 12/24/2016  . Primary osteoarthritis of both hips 12/24/2016  . Primary osteoarthritis of both knees 12/24/2016  . H/O total knee replacement, right 12/24/2016  . Spondylosis of lumbar region without myelopathy or radiculopathy 12/24/2016  . Acute gout 05/30/2016  . Myalgia 03/29/2016  . Other long term (current) drug therapy 03/29/2016  . Idiopathic peripheral neuropathy 03/29/2016  . Cannot sleep 03/29/2016  . Migraine without aura and responsive to treatment 03/29/2016  . Multifocal myoclonus 03/29/2016  . Restless leg 03/29/2016  . Has a tremor 03/29/2016  . History of aspiration pneumonitis 01/25/2016  . History of acute bronchitis 01/25/2016  . LPRD (laryngopharyngeal reflux disease) 01/25/2016  . Imbalance 01/09/2016  . Serotonin syndrome 12/22/2015  . OA (osteoarthritis) of knee 09/05/2015  . Obesity 05/17/2015  . OSA (obstructive sleep apnea) 03/16/2013  . Iron deficiency 11/06/2012  . Thrombocythemia ([place]) 11/06/2012  . Leukocytosis 11/05/2012  . Impaired fasting glucose 07/23/2012  . Bronchitis 04/03/2012  .  Kidney stone on left side 03/05/2012  . Kidney cysts 03/04/2012  . Loose stools 03/04/2012  . Urinary frequency 12/04/2011  . Restless leg syndrome 12/04/2011  . Polypharmacy 12/04/2011  . Bladder  incontinence 12/04/2011  . Fibromyalgia   . Hyperlipidemia   . Edema   . S/P endometrial ablation   . Allergic rhinitis 09/18/2011  . Depression, major, single episode, in partial remission ([place]) 04/21/2011  . PRECORDIAL PAIN 02/17/2010  . Anxiety state 01/30/2010  . Migraine 01/30/2010  . GERD 01/30/2010  . Gastroparesis 01/30/2010    Past Medical History:  Diagnosis Date  . Anemia    previously followed by Dr. Jamse Arn for anemia and elevated platelets  . Anxiety   . C. difficile colitis 10/01/12   treated by [facility]  . Chronic fatigue syndrome   . DDD (degenerative disc disease), lumbar 08/19/14   and facet arthroplasty & left lumbar radiculopathy (Dr.Ramos)  . Depression   . Dyssynergia    dyssynergenic defecation, contributing to fecal incontinence.  . Edema   . Fibromyalgia   . Gastroparesis    followed at [facility]  . GERD (gastroesophageal reflux disease)   . Hyperlipidemia   . Kidney stone   . Lumbar radiculopathy   . Migraine   . Neuropathy   . Obstructive sleep apnea    Does  wear  CPAP  . Paresthesia    Dr. Everette Rank at [facility]  . Pelvic floor dysfunction    pelvic floor dyssynergy  . Plantar fasciitis 02/2011   R foot  . Pneumonia    2012  . PONV (postoperative nausea and vomiting)    pt states has gastroparesis has difficulty taking antibiotics and narcotics has severe nausea and vomiting   . Restless leg syndrome   . S/P endometrial ablation 08/09/2006   Novasure Ablation  . S/P epidural steroid injection 09/20/14   Dr.Ramos  . Tremor    Dr. Everette Rank  . Urinary frequency   . Urinary incontinence     Family History  Problem Relation Age of Onset  . Allergies Mother   . Hypertension Mother   . Heart disease Mother        possible valve problem - leaking valve  . Macular degeneration Mother   . Heart disease Father        pacemaker, CHF  . Hypertension Father   . Diabetes Father        borderline  . Stroke Father 6  . Kidney disease  Father   . Asthma Sister   . Irritable bowel syndrome Sister   . Allergies Sister   . Heart disease Paternal Grandmother   . Heart disease Paternal Grandfather   . Cancer Maternal Aunt        leukemia  . Cancer Maternal Aunt   . Colon cancer Maternal Aunt        late 62's  . CAD Neg Hx    Past Surgical History:  Procedure Laterality Date  . CHOLECYSTECTOMY  9/05  . ENDOMETRIAL ABLATION  08/09/2006   Dr. Valentina Shaggy Ablation  . FACET JOINT INJECTION  04/17/2017   Left L4-5 and L5-S1  . KNEE ARTHROPLASTY    . KNEE SURGERY  1999   R knee, Dr. Eddie Dibbles, torn cartilage  . RETINAL LASER PROCEDURE Right 08/28/2018   laser retinopexy  . RIGHT/LEFT HEART CATH AND CORONARY ANGIOGRAPHY N/A 01/01/2018   Procedure: RIGHT/LEFT HEART CATH AND CORONARY ANGIOGRAPHY;  Surgeon: Sherren Mocha, MD;  Location: [facility];  Service: Cardiovascular;  Laterality: N/A;  . TONSILLECTOMY  1968  . TONSILLECTOMY    . TOTAL KNEE ARTHROPLASTY Right 09/05/2015   Procedure: RIGHT TOTAL KNEE ARTHROPLASTY;  Surgeon: Gaynelle Arabian, MD;  Location: [facility];  Service: Orthopedics;  Laterality: Right;  . TOTAL KNEE ARTHROPLASTY Left 07/14/2018   Procedure: LEFT TOTAL KNEE ARTHROPLASTY;  Surgeon: Gaynelle Arabian, MD;  Location: [facility];  Service: Orthopedics;  Laterality: Left;  . ULTRASOUND GUIDANCE FOR VASCULAR ACCESS  01/01/2018   Procedure: Ultrasound Guidance For Vascular Access;  Surgeon: Sherren Mocha, MD;  Location: [facility];  Service: Cardiovascular;;   Social History   Social History Narrative   Married, 1 dog. 1 son in [place] (grandson born 06/2017), 1 stepson in [place], with 2 children    Objective: Vital Signs: BP 121/78 (BP Location: Left Arm, Patient Position: Sitting, Cuff Size: Normal)   Pulse 78   Resp 14   Ht 5' 2.5" (1.588 m)   Wt 206 lb (93.4 kg)   LMP 07/27/2006   BMI 37.08 kg/m    Physical Exam Vitals signs and nursing note reviewed.  Constitutional:       Appearance: She is well-developed.  HENT:     Head: Normocephalic and atraumatic.  Eyes:     Conjunctiva/sclera: Conjunctivae normal.  Neck:     Musculoskeletal: Normal range of motion.  Cardiovascular:     Rate and Rhythm: Normal rate and regular rhythm.     Heart sounds: Normal heart sounds.  Pulmonary:     Effort: Pulmonary effort is normal.     Breath sounds: Normal breath sounds.  Abdominal:     General: Bowel sounds are normal.     Palpations: Abdomen is soft.  Lymphadenopathy:     Cervical: No cervical adenopathy.  Skin:    General: Skin is warm and dry.     Capillary Refill: Capillary refill takes less than 2 seconds.  Neurological:     Mental Status: She is alert and oriented to person, place, and time.  Psychiatric:        Behavior: Behavior normal.      Musculoskeletal Exam: C-spine thoracic lumbar spine limited range of motion with discomfort.  Shoulder joints elbow joints wrist joints MCPs PIPs DIPs been good range of motion.  She had good range of motion of bilateral hip joints.  Right total knee replacement is doing well.  Left total knee replacement was recent and she had some warmth and swelling and discomfort with range of motion.  She has generalized pain, hyperalgesia and positive tender points.  CDAI Exam: CDAI Score: Not documented Patient Global Assessment: Not documented; Provider Global Assessment: Not documented Swollen: Not documented; Tender: Not documented Joint Exam   Not documented   There is currently no information documented on the homunculus. Go to the Rheumatology activity and complete the homunculus joint exam.  Investigation: No additional findings.  Imaging: No results found.  Recent Labs: Lab Results  Component Value Date   WBC 10.3 08/27/2018   HGB 12.3 08/27/2018   PLT 397.0 08/27/2018   NA 142 08/13/2018   K 4.3 08/13/2018   CL 103 08/13/2018   CO2 25 08/13/2018   GLUCOSE 96 10/09/2018   BUN 26 08/13/2018    CREATININE 0.96 08/13/2018   BILITOT 0.2 08/13/2018   ALKPHOS 131 (H) 08/13/2018   AST 19 08/13/2018   ALT 23 08/13/2018   PROT 7.0 08/13/2018   ALBUMIN 4.0 08/13/2018   CALCIUM 9.2 08/13/2018  GFRAA 74 08/13/2018    Speciality Comments: No specialty comments available.  Procedures:  No procedures performed Allergies: Erythromycin; Tramadol; and Dilaudid [hydromorphone hcl]   Assessment / Plan:     Visit Diagnoses: Fibromyalgia-she continues to have some generalized pain discomfort and positive tender points.  Need for regular exercise was discussed.  Although due to recent ankle injury she is not able to much.  She may benefit from physical therapy in future.  She is currently on Topamax at nighttime which helps her with generalized pain.  She has been having increased muscle pain and requests methocarbamol refilled.  She was given that last time in June.  We will refill methocarbamol today.  Side effects were reviewed.  Frequent falls-patient gives history of frequent falls.  We discussed physical therapy for fall prevention and lower extremity muscle strengthening after her right ankle fracture heals.  Have given her prescription which she can take to Dr. Bernadette Hoit office.  DDD (degenerative disc disease), cervical-she continues to have some discomfort in the cervical region.  DDD (degenerative disc disease), lumbar-lower back pain is tolerable.  History of total knee replacement, bilateral-she had left total knee replacement in August which is a still warm and swollen.  Right total knee replacement is doing well.  Primary osteoarthritis of both hips  Idiopathic chronic gout of multiple sites without tophus -she denies any recent flare of gout.  Treated with allopurinol and colchicine by her PCP.  Other medical problems are listed as follows.  History of migraine  History of depression  History of sleep apnea-she continues to have chronic insomnia and fatigue related to  that.    Orders: No orders of the defined types were placed in this encounter.  Meds ordered this encounter  Medications  . methocarbamol (ROBAXIN) 500 MG tablet    Sig: Take 1 tablet (500 mg total) by mouth 2 (two) times daily as needed for muscle spasms.    Dispense:  60 tablet    Refill:  0     Follow-Up Instructions: Return in about 6 months (around 05/13/2019) for FMS, OA, Gout.   Bo Merino, MD  Note - This record has been created using Editor, commissioning.  Chart creation errors have been sought, but may not always  have been located. Such creation errors do not reflect on  the standard of medical care.

## 2018-10-29 DIAGNOSIS — N200 Calculus of kidney: Secondary | ICD-10-CM | POA: Diagnosis not present

## 2018-10-29 DIAGNOSIS — Z87442 Personal history of urinary calculi: Secondary | ICD-10-CM | POA: Diagnosis not present

## 2018-11-04 DIAGNOSIS — M25571 Pain in right ankle and joints of right foot: Secondary | ICD-10-CM | POA: Diagnosis not present

## 2018-11-06 DIAGNOSIS — M79671 Pain in right foot: Secondary | ICD-10-CM | POA: Diagnosis not present

## 2018-11-06 DIAGNOSIS — M797 Fibromyalgia: Secondary | ICD-10-CM | POA: Diagnosis not present

## 2018-11-06 DIAGNOSIS — M109 Gout, unspecified: Secondary | ICD-10-CM | POA: Diagnosis not present

## 2018-11-06 DIAGNOSIS — S92001D Unspecified fracture of right calcaneus, subsequent encounter for fracture with routine healing: Secondary | ICD-10-CM | POA: Diagnosis not present

## 2018-11-06 DIAGNOSIS — S92354D Nondisplaced fracture of fifth metatarsal bone, right foot, subsequent encounter for fracture with routine healing: Secondary | ICD-10-CM | POA: Diagnosis not present

## 2018-11-11 ENCOUNTER — Ambulatory Visit: Payer: 59 | Admitting: Rheumatology

## 2018-11-11 ENCOUNTER — Encounter: Payer: Self-pay | Admitting: Rheumatology

## 2018-11-11 VITALS — BP 121/78 | HR 78 | Resp 14 | Ht 62.5 in | Wt 206.0 lb

## 2018-11-11 DIAGNOSIS — Z96653 Presence of artificial knee joint, bilateral: Secondary | ICD-10-CM | POA: Diagnosis not present

## 2018-11-11 DIAGNOSIS — R296 Repeated falls: Secondary | ICD-10-CM

## 2018-11-11 DIAGNOSIS — M5136 Other intervertebral disc degeneration, lumbar region: Secondary | ICD-10-CM

## 2018-11-11 DIAGNOSIS — M1A09X0 Idiopathic chronic gout, multiple sites, without tophus (tophi): Secondary | ICD-10-CM | POA: Diagnosis not present

## 2018-11-11 DIAGNOSIS — M5030 Other cervical disc degeneration, unspecified cervical region: Secondary | ICD-10-CM

## 2018-11-11 DIAGNOSIS — M160 Bilateral primary osteoarthritis of hip: Secondary | ICD-10-CM | POA: Diagnosis not present

## 2018-11-11 DIAGNOSIS — Z8669 Personal history of other diseases of the nervous system and sense organs: Secondary | ICD-10-CM

## 2018-11-11 DIAGNOSIS — M797 Fibromyalgia: Secondary | ICD-10-CM | POA: Diagnosis not present

## 2018-11-11 DIAGNOSIS — R69 Illness, unspecified: Secondary | ICD-10-CM | POA: Diagnosis not present

## 2018-11-11 DIAGNOSIS — Z8659 Personal history of other mental and behavioral disorders: Secondary | ICD-10-CM

## 2018-11-11 MED ORDER — METHOCARBAMOL 500 MG PO TABS: 500.0000 mg | ORAL_TABLET | Freq: Two times a day (BID) | ORAL | 0 refills | Status: DC | PRN

## 2018-11-12 ENCOUNTER — Encounter: Payer: Self-pay | Admitting: Skilled Nursing Facility1

## 2018-11-12 ENCOUNTER — Encounter: Payer: Medicare HMO | Attending: Surgery | Admitting: Skilled Nursing Facility1

## 2018-11-12 DIAGNOSIS — Z905 Acquired absence of kidney: Secondary | ICD-10-CM | POA: Insufficient documentation

## 2018-11-12 DIAGNOSIS — Z8249 Family history of ischemic heart disease and other diseases of the circulatory system: Secondary | ICD-10-CM | POA: Insufficient documentation

## 2018-11-12 DIAGNOSIS — Z8261 Family history of arthritis: Secondary | ICD-10-CM | POA: Diagnosis not present

## 2018-11-12 DIAGNOSIS — Z6841 Body Mass Index (BMI) 40.0 and over, adult: Secondary | ICD-10-CM | POA: Diagnosis not present

## 2018-11-12 DIAGNOSIS — Z841 Family history of disorders of kidney and ureter: Secondary | ICD-10-CM | POA: Diagnosis not present

## 2018-11-12 DIAGNOSIS — Z9049 Acquired absence of other specified parts of digestive tract: Secondary | ICD-10-CM | POA: Insufficient documentation

## 2018-11-12 DIAGNOSIS — Z823 Family history of stroke: Secondary | ICD-10-CM | POA: Insufficient documentation

## 2018-11-12 DIAGNOSIS — Z713 Dietary counseling and surveillance: Secondary | ICD-10-CM | POA: Diagnosis not present

## 2018-11-12 DIAGNOSIS — Z8379 Family history of other diseases of the digestive system: Secondary | ICD-10-CM | POA: Diagnosis not present

## 2018-11-12 DIAGNOSIS — E669 Obesity, unspecified: Secondary | ICD-10-CM

## 2018-11-12 DIAGNOSIS — K219 Gastro-esophageal reflux disease without esophagitis: Secondary | ICD-10-CM | POA: Diagnosis not present

## 2018-11-12 DIAGNOSIS — Z888 Allergy status to other drugs, medicaments and biological substances status: Secondary | ICD-10-CM | POA: Insufficient documentation

## 2018-11-12 DIAGNOSIS — Z818 Family history of other mental and behavioral disorders: Secondary | ICD-10-CM | POA: Insufficient documentation

## 2018-11-12 DIAGNOSIS — Z79899 Other long term (current) drug therapy: Secondary | ICD-10-CM | POA: Insufficient documentation

## 2018-11-12 NOTE — Progress Notes (Signed)
RYGB Assessment: 6th SWL Appointment.  Pt needs 6 visits.  Pt has gastroparesis and GERD.  Pt states she got knee surgery August 19th.  Pt states she does not like vegetables.  Pt states she has fallen twice since her last appt resulting in injury to her back and foot needing a boot on her foot. Pt states her injury has saddened her because there is so much she wants to do for christmas and now cannot do it. Pt states she just feels discouraged from having physical ailments. Pt states he is going to be having too many ailments to have bariatric surgery. Pt states her husband tells her he has seen a difference with how she is eating when they eat out: choosing green beans over mac n cheese. Pt states she is still struggling with eating too much peanut butter stating she will stop buying so the temptation is not there. Pt states she realized when she was getting discouraged she wants to overeat.   Pt states she has been successful with:making better choices, water and gatorade zero instead of tea and regular gatorade Pt states she may struggle with not drinking with meals After surgery and states hopefully getting off some medicines will get rid of her dryness  Start Wt at NDES: 225.8 Wt: 218 BMI: 40.35  MEDICATIONS: See List   DIETARY INTAKE:  24-hr recall: eating spoonfuls of peanut butter  B ( AM): rice crispie cereal with banana or yogurt or cheese toast Snk ( AM): yogurt L ( PM): sandwich or salad  Snk ( PM): banana or P3 or 100 calorie pack of nuts D ( PM): roast with potatoes and carrots or chicken pie and green bean  Snk ( PM): peanut butter or fruit or nuts Beverages: water, gatorade zero: 60+ounces   Usual physical activity: ADL's; PT exercises   Diet to Follow: 1500 calories 170 g carbohydrates 112 g protein 42 g fat   Nutritional Diagnosis:  [place]-3.3 Overweight/obesity related to past poor dietary habits and physical inactivity as evidenced by patient w/ planned RYGB  surgery following dietary guidelines for continued weight loss.    Intervention:  Nutrition counseling for upcoming Bariatric Surgery. Goals: -Encouraged to engage in 150 minutes of moderate physical activity including cardiovascular and weight baring weekly -Continue to chew until applesauce consistency  -Continue to not drink Gatorade and mountain dew -Continue to work on eating vegetables every day -Continue to not drink with meals -Continue to work on your physical therapy  Teaching Method Utilized:  Ship broker Hands on  Barriers to learning/adherence to lifestyle change: none identified   Demonstrated degree of understanding via:  Teach Back   Monitoring/Evaluation:  Dietary intake, exercise,and body weight prn.

## 2018-11-13 ENCOUNTER — Ambulatory Visit: Payer: Medicare HMO | Admitting: Skilled Nursing Facility1

## 2018-11-15 DIAGNOSIS — G4733 Obstructive sleep apnea (adult) (pediatric): Secondary | ICD-10-CM | POA: Diagnosis not present

## 2018-11-17 DIAGNOSIS — Z96652 Presence of left artificial knee joint: Secondary | ICD-10-CM | POA: Diagnosis not present

## 2018-11-17 DIAGNOSIS — Z471 Aftercare following joint replacement surgery: Secondary | ICD-10-CM | POA: Diagnosis not present

## 2018-11-18 ENCOUNTER — Encounter: Payer: Self-pay | Admitting: Family Medicine

## 2018-11-18 DIAGNOSIS — G609 Hereditary and idiopathic neuropathy, unspecified: Secondary | ICD-10-CM | POA: Diagnosis not present

## 2018-11-18 DIAGNOSIS — S92354D Nondisplaced fracture of fifth metatarsal bone, right foot, subsequent encounter for fracture with routine healing: Secondary | ICD-10-CM | POA: Diagnosis not present

## 2018-11-18 DIAGNOSIS — S92001D Unspecified fracture of right calcaneus, subsequent encounter for fracture with routine healing: Secondary | ICD-10-CM | POA: Diagnosis not present

## 2018-11-18 DIAGNOSIS — M109 Gout, unspecified: Secondary | ICD-10-CM | POA: Diagnosis not present

## 2018-11-18 DIAGNOSIS — M79671 Pain in right foot: Secondary | ICD-10-CM | POA: Diagnosis not present

## 2018-11-21 DIAGNOSIS — G4733 Obstructive sleep apnea (adult) (pediatric): Secondary | ICD-10-CM | POA: Diagnosis not present

## 2018-12-03 ENCOUNTER — Other Ambulatory Visit: Payer: Self-pay | Admitting: Rheumatology

## 2018-12-03 NOTE — Telephone Encounter (Signed)
Last Visit: 11/12/19 Next visit: 05/13/19  Okay to refill per Dr. Estanislado Pandy

## 2018-12-04 DIAGNOSIS — K219 Gastro-esophageal reflux disease without esophagitis: Secondary | ICD-10-CM | POA: Diagnosis not present

## 2018-12-04 DIAGNOSIS — Z1211 Encounter for screening for malignant neoplasm of colon: Secondary | ICD-10-CM | POA: Diagnosis not present

## 2018-12-04 DIAGNOSIS — R9389 Abnormal findings on diagnostic imaging of other specified body structures: Secondary | ICD-10-CM | POA: Diagnosis not present

## 2018-12-04 DIAGNOSIS — R948 Abnormal results of function studies of other organs and systems: Secondary | ICD-10-CM | POA: Diagnosis not present

## 2018-12-04 DIAGNOSIS — Z8719 Personal history of other diseases of the digestive system: Secondary | ICD-10-CM | POA: Diagnosis not present

## 2018-12-04 DIAGNOSIS — R194 Change in bowel habit: Secondary | ICD-10-CM | POA: Diagnosis not present

## 2018-12-07 ENCOUNTER — Other Ambulatory Visit: Payer: Self-pay | Admitting: Family Medicine

## 2018-12-08 ENCOUNTER — Other Ambulatory Visit: Payer: Self-pay | Admitting: Rheumatology

## 2018-12-09 ENCOUNTER — Other Ambulatory Visit: Payer: Self-pay | Admitting: Rheumatology

## 2018-12-09 DIAGNOSIS — H04123 Dry eye syndrome of bilateral lacrimal glands: Secondary | ICD-10-CM | POA: Diagnosis not present

## 2018-12-09 DIAGNOSIS — M25561 Pain in right knee: Secondary | ICD-10-CM | POA: Diagnosis not present

## 2018-12-09 DIAGNOSIS — H353131 Nonexudative age-related macular degeneration, bilateral, early dry stage: Secondary | ICD-10-CM | POA: Diagnosis not present

## 2018-12-09 DIAGNOSIS — Z79899 Other long term (current) drug therapy: Secondary | ICD-10-CM | POA: Diagnosis not present

## 2018-12-09 DIAGNOSIS — D3132 Benign neoplasm of left choroid: Secondary | ICD-10-CM | POA: Diagnosis not present

## 2018-12-09 DIAGNOSIS — G43909 Migraine, unspecified, not intractable, without status migrainosus: Secondary | ICD-10-CM | POA: Diagnosis not present

## 2018-12-09 DIAGNOSIS — M79671 Pain in right foot: Secondary | ICD-10-CM | POA: Diagnosis not present

## 2018-12-09 NOTE — Telephone Encounter (Signed)
Last Visit: 11/12/19 Next visit: 05/13/19  Okay to refill Armodafil?

## 2018-12-09 NOTE — Telephone Encounter (Signed)
ok 

## 2018-12-11 DIAGNOSIS — M25562 Pain in left knee: Secondary | ICD-10-CM | POA: Diagnosis not present

## 2018-12-11 DIAGNOSIS — M25561 Pain in right knee: Secondary | ICD-10-CM | POA: Diagnosis not present

## 2018-12-11 DIAGNOSIS — M79671 Pain in right foot: Secondary | ICD-10-CM | POA: Diagnosis not present

## 2018-12-15 DIAGNOSIS — M25562 Pain in left knee: Secondary | ICD-10-CM | POA: Diagnosis not present

## 2018-12-15 DIAGNOSIS — M25561 Pain in right knee: Secondary | ICD-10-CM | POA: Diagnosis not present

## 2018-12-15 DIAGNOSIS — M79671 Pain in right foot: Secondary | ICD-10-CM | POA: Diagnosis not present

## 2018-12-16 DIAGNOSIS — G4733 Obstructive sleep apnea (adult) (pediatric): Secondary | ICD-10-CM | POA: Diagnosis not present

## 2018-12-17 ENCOUNTER — Encounter: Payer: Self-pay | Admitting: *Deleted

## 2018-12-18 DIAGNOSIS — Z471 Aftercare following joint replacement surgery: Secondary | ICD-10-CM | POA: Diagnosis not present

## 2018-12-18 DIAGNOSIS — Z96652 Presence of left artificial knee joint: Secondary | ICD-10-CM | POA: Diagnosis not present

## 2018-12-18 NOTE — Progress Notes (Signed)
[facility] Note  12/19/2018     CHIEF COMPLAINT Patient presents for Retina Follow Up   HISTORY OF PRESENT ILLNESS: [name] is a 63 y.o. female who presents to the clinic today for:   HPI    Retina Follow Up    Patient presents with  Retinal Break/Detachment.  In right eye.  Severity is moderate.  Duration of 3 months.  Since onset it is stable.  I, the attending physician,  performed the HPI with the patient and updated documentation appropriately.          Comments    Patient states no new floaters, flashes. No significant change in vision.       Last edited by Bernarda Caffey, MD on 12/19/2018 10:35 AM. (History)    pt states she tolerated laser well, she used gtts as instructed, she denies seeing anymore flashes of light   Referring physician: Rita Ohara, MD [place], [place] [address]  HISTORICAL INFORMATION:   Selected notes from the MEDICAL RECORD NUMBER Referred by Dr. Virgina Evener for concern of PVD OD with retinal tears LEE: 10.01.19 Sammie Bench) [BCVA: OD: 20/40++ OS: 20/25] Ocular Hx-non-exu ARMD OU, cataract OU, DES OU PMH-anemia, anxiety, depression, edema, fibromyalgia, HLD, migraines, neuropathy    CURRENT MEDICATIONS: Current Outpatient Medications (Ophthalmic Drugs)  Medication Sig  . Propylene Glycol (SYSTANE COMPLETE) 0.6 % SOLN Place 1 drop into both eyes 2 (two) times daily.  Marland Kitchen XIIDRA 5 % SOLN Apply 1 drop to eye 2 (two) times daily.   No current facility-administered medications for this visit.  (Ophthalmic Drugs)   Current Outpatient Medications (Other)  Medication Sig  . acetaminophen (TYLENOL) 500 MG tablet Take 1,000 mg by mouth 3 (three) times daily as needed for moderate pain.  Marland Kitchen albuterol (PROVENTIL HFA;VENTOLIN HFA) 108 (90 Base) MCG/ACT inhaler Inhale 1-2 puffs into the lungs every 6 (six) hours as needed for wheezing or shortness of breath.  . allopurinol (ZYLOPRIM) 300 MG tablet TAKE  1 TABLET (300 MG TOTAL) DAILY BY MOUTH.  Marland Kitchen ALPRAZolam (XANAX) 0.5 MG tablet Take 0.5 mg by mouth 3 (three) times daily as needed for anxiety.  . ARIPiprazole (ABILIFY) 5 MG tablet Take 5 mg by mouth daily.  . Armodafinil 250 MG tablet TAKE 1 TABLET EVERY DAY WITH BREAKFAST  . aspirin 81 MG tablet aspirin 81 mg tablet,delayed release  TAKE 1 TABLET BY MOUTH EVERY DAY  . atorvastatin (LIPITOR) 40 MG tablet TAKE 1 TABLET BY MOUTH EVERY DAY  . cetirizine (ZYRTEC) 10 MG tablet Take 10 mg by mouth at bedtime.  . cholecalciferol (VITAMIN D) 1000 units tablet Take 1,000 Units by mouth daily.  . colchicine 0.6 MG tablet Take 2 tablets by mouth at onset of gout flare. Taken an additional tablet 1 hour later, if needed. May take like this every 3d, if needed  . DULoxetine (CYMBALTA) 60 MG capsule Take 60 mg by mouth 2 (two) times daily.   . fluticasone (FLONASE) 50 MCG/ACT nasal spray PLACE 2 SPRAYS INTO BOTH NOSTRILS DAILY AS NEEDED FOR ALLERGIES  . gabapentin (NEURONTIN) 300 MG capsule Take 1 capsule (300 mg total) by mouth 3 (three) times daily. Gabapentin 300 mg Protocol Take a 300 mg capsule three times a day for two weeks following surgery. Then take a 300 mg capsule two times a day for two weeks.  Then resume a 300 mg capsule once a day at bedtime.  . Melatonin 10 MG  TABS Take 10 mg by mouth at bedtime.  . meloxicam (MOBIC) 15 MG tablet Take 15 mg by mouth daily.  . methocarbamol (ROBAXIN) 500 MG tablet Take 1 tablet (500 mg total) by mouth 2 (two) times daily as needed for muscle spasms.  . Multiple Vitamins-Minerals (ICAPS AREDS 2) CAPS Take 1 capsule by mouth 2 (two) times daily.  . nortriptyline (PAMELOR) 25 MG capsule Take 75 mg by mouth at bedtime.   . Omega-3 Fatty Acids (FISH OIL) 1200 MG CAPS Take 1,200 mg by mouth daily.  . ondansetron (ZOFRAN) 8 MG tablet Take 8 mg by mouth every 8 (eight) hours as needed for nausea.   . pantoprazole (PROTONIX) 40 MG tablet Take 40 mg by mouth 2 (two)  times daily.  . Probiotic Product (Mentor) CAPS Take 1 capsule by mouth at bedtime.   . prochlorperazine (COMPAZINE) 10 MG tablet Take 10 mg by mouth every 6 (six) hours as needed for nausea or vomiting.   . ranitidine (ZANTAC) 300 MG tablet Take 300 mg by mouth 2 (two) times daily.   Marland Kitchen topiramate (TOPAMAX) 25 MG tablet TAKE 2 TABLETS (50 MG TOTAL) BY MOUTH AT BEDTIME.9  . vitamin B-12 (CYANOCOBALAMIN) 1000 MCG tablet Take 1,000 mcg by mouth daily.  Marland Kitchen amoxicillin (AMOXIL) 500 MG tablet Take 2 tablets (1,000 mg total) by mouth 2 (two) times daily. (Patient not taking: Reported on 11/11/2018)  . metoprolol tartrate (LOPRESSOR) 50 MG tablet Take 1 tablet (50 mg total) by mouth 2 (two) times daily.   No current facility-administered medications for this visit.  (Other)      REVIEW OF SYSTEMS: ROS    Positive for: Eyes   Negative for: Constitutional, Gastrointestinal, Neurological, Skin, Genitourinary, Musculoskeletal, HENT, Endocrine, Cardiovascular, Respiratory, Psychiatric, Allergic/Imm, Heme/Lymph   Last edited by Roselee Nova D on 12/19/2018  9:41 AM. (History)       ALLERGIES Allergies  Allergen Reactions  . Erythromycin Nausea Only    Abdominal pain  . Tramadol Itching  . Dilaudid [Hydromorphone Hcl] Itching    PAST MEDICAL HISTORY Past Medical History:  Diagnosis Date  . Anemia    previously followed by Dr. Jamse Arn for anemia and elevated platelets  . Anxiety   . C. difficile colitis 10/01/12   treated by [facility]  . Chronic fatigue syndrome   . DDD (degenerative disc disease), lumbar 08/19/14   and facet arthroplasty & left lumbar radiculopathy (Dr.Ramos)  . Depression   . Dyssynergia    dyssynergenic defecation, contributing to fecal incontinence.  . Edema   . Fibromyalgia   . Gastroparesis    followed at [facility]  . GERD (gastroesophageal reflux disease)   . Hyperlipidemia   . Kidney stone   . Lumbar radiculopathy   . Migraine   . Neuropathy   .  Obstructive sleep apnea    Does  wear  CPAP  . Paresthesia    [facility] Everette Rank [facility]  . Pelvic floor dysfunction    pelvic floor dyssynergy  . Plantar fasciitis 02/2011   R foot  . Pneumonia    2012  . PONV (postoperative nausea and vomiting)    pt states has gastroparesis has difficulty taking antibiotics and narcotics has severe nausea and vomiting   . Restless leg syndrome   . S/P endometrial ablation 08/09/2006   Novasure Ablation  . S/P epidural steroid injection 09/20/14   Dr.Ramos  . Tremor    Dr. Everette Rank  . Urinary frequency   .  Urinary incontinence    Past Surgical History:  Procedure Laterality Date  . CHOLECYSTECTOMY  9/05  . ENDOMETRIAL ABLATION  08/09/2006   Dr. Valentina Shaggy Ablation  . FACET JOINT INJECTION  04/17/2017   Left L4-5 and L5-S1  . KNEE ARTHROPLASTY    . KNEE SURGERY  1999   R knee, Dr. Eddie Dibbles, torn cartilage  . RETINAL LASER PROCEDURE Right 08/28/2018   laser retinopexy  . RIGHT/LEFT HEART CATH AND CORONARY ANGIOGRAPHY N/A 01/01/2018   Procedure: RIGHT/LEFT HEART CATH AND CORONARY ANGIOGRAPHY;  Surgeon: Sherren Mocha, MD;  Location: [facility];  Service: Cardiovascular;  Laterality: N/A;  . TONSILLECTOMY  1968  . TONSILLECTOMY    . TOTAL KNEE ARTHROPLASTY Right 09/05/2015   Procedure: RIGHT TOTAL KNEE ARTHROPLASTY;  Surgeon: Gaynelle Arabian, MD;  Location: [facility];  Service: Orthopedics;  Laterality: Right;  . TOTAL KNEE ARTHROPLASTY Left 07/14/2018   Procedure: LEFT TOTAL KNEE ARTHROPLASTY;  Surgeon: Gaynelle Arabian, MD;  Location: [facility];  Service: Orthopedics;  Laterality: Left;  . ULTRASOUND GUIDANCE FOR VASCULAR ACCESS  01/01/2018   Procedure: Ultrasound Guidance For Vascular Access;  Surgeon: Sherren Mocha, MD;  Location: [facility];  Service: Cardiovascular;;    FAMILY HISTORY Family History  Problem Relation Age of Onset  . Allergies Mother   . Hypertension Mother   . Heart disease Mother        possible valve  problem - leaking valve  . Macular degeneration Mother   . Heart disease Father        pacemaker, CHF  . Hypertension Father   . Diabetes Father        borderline  . Stroke Father 74  . Kidney disease Father   . Asthma Sister   . Irritable bowel syndrome Sister   . Allergies Sister   . Heart disease Paternal Grandmother   . Heart disease Paternal Grandfather   . Cancer Maternal Aunt        leukemia  . Cancer Maternal Aunt   . Colon cancer Maternal Aunt        late 66's  . CAD Neg Hx     SOCIAL HISTORY Social History   Tobacco Use  . Smoking status: Never Smoker  . Smokeless tobacco: Never Used  Substance Use Topics  . Alcohol use: No    Alcohol/week: 0.0 standard drinks  . Drug use: No         OPHTHALMIC EXAM:  Base Eye Exam    Visual Acuity (Snellen - Linear)      Right Left   Dist cc 20/30 -1 20/30 -1   Dist ph cc NI 20/25 -1   Correction:  Glasses       Tonometry (Tonopen, 9:52 AM)      Right Left   Pressure 12 12       Pupils      Dark Light Shape React APD   Right 4 3 Round Brisk None   Left 4 3 Round Brisk None       Visual Fields (Counting fingers)      Left Right    Full Full       Extraocular Movement      Right Left    Full, Ortho Full, Ortho       Neuro/Psych    Oriented x3:  Yes   Mood/Affect:  Normal       Dilation    Both eyes:  1.0% Mydriacyl, 2.5% Phenylephrine @ 9:52 AM  Slit Lamp and Fundus Exam    Slit Lamp Exam      Right Left   Lids/Lashes Dermatochalasis - upper lid, Meibomian gland dysfunction Dermatochalasis - upper lid, Meibomian gland dysfunction   Conjunctiva/Sclera White and quiet White and quiet   Cornea Arcus, 2+ Punctate epithelial erosions Arcus, 1+ Punctate epithelial erosions   Anterior Chamber Deep and quiet Deep and quiet   Iris Round and dilated Round and reactive   Lens 2+ Nuclear sclerosis, 1+ Cortical cataract 2+ Nuclear sclerosis, 1+ Cortical cataract   Vitreous Vitreous syneresis,  negative shafers , Posterior vitreous detachment Vitreous syneresis       Fundus Exam      Right Left   Disc Pink and Sharp Pink and Sharp   C/D Ratio 0.3 0.4   Macula Blunted foveal reflex, Drusen, No heme or edema Blunted foveal reflex, Retinal pigment epithelial mottling, Drusen, No heme or edema, macula flat   Vessels Vascular attenuation, mild tortuosity Mild Vascular attenuation, mildly Tortuous   Periphery Attached, two small retinal tears at 0300 with surrounding heme -- excellent laser surrounding both tears -- no SRF, Reticular degeneration Attached, Choroidal nevus at 0930 mid-zone, flat, small, 0.25 x 0.75 DD        Refraction    Wearing Rx      Sphere Cylinder Axis   Right -2.75 +0.75 170   Left -2.25 +1.00 038   Age:  1 day          IMAGING AND PROCEDURES  Imaging and Procedures for @TODAY @  OCT, Retina - OU - Both Eyes       Right Eye Quality was good. Central Foveal Thickness: 277. Progression has been stable. Findings include normal foveal contour, no IRF, no SRF, retinal drusen .   Left Eye Quality was good. Central Foveal Thickness: 270. Progression has been stable. Findings include normal foveal contour, no IRF, no SRF, retinal drusen , vitreomacular adhesion  (Interval release of VMA from fovea).   Notes *Images captured and stored on drive  Diagnosis / Impression:  NFP, No IRF/SRF OU Retinal drusen OU  Clinical management:  See below  Abbreviations: NFP - Normal foveal profile. CME - cystoid macular edema. PED - pigment epithelial detachment. IRF - intraretinal fluid. SRF - subretinal fluid. EZ - ellipsoid zone. ERM - epiretinal membrane. ORA - outer retinal atrophy. ORT - outer retinal tubulation. SRHM - subretinal hyper-reflective material                  ASSESSMENT/PLAN:    ICD-10-CM   1. Multiple defects of right retina without detachment H33.331   2. Posterior vitreous detachment of right eye H43.811   3. Early dry stage  nonexudative age-related macular degeneration of both eyes H35.3131   4. Choroidal nevus of left eye D31.32   5. Retinal edema H35.81 OCT, Retina - OU - Both Eyes  6. Combined forms of age-related cataract of both eyes H25.813     1. Retinal tears, OD   - two small tears at 0300 with surrounding heme - S/P laser retinopexy OD (10.03.19)  -- good laser surrounding - no new RT/RD - clear from a retina standpoint to f/u with Dr. Posey Pronto - f/u prn here with Dr. Coralyn Pear  2. PVD / vitreous syneresis OD  Discussed findings and prognosis  No RD on 360 peripheral exam  Reviewed s/s of RT/RD  Strict return precautions for any such RT/RD signs/symptoms  3.Age related macular degeneration, non-exudative, both eyes  -  early stage OU  - The incidence, anatomy, and pathology of dry AMD, risk of progression, and the AREDS and AREDS 2 study including smoking risks discussed with patient.  - Recommend amsler grid monitoring  4. Choroidal nevus OS-  - small, flat, at 0930 mid-zone - no suspicious features - monitor  5. No retinal edema on exam or OCT  6. Combined form age related cataract OU-  - The symptoms of cataract, surgical options, and treatments and risks were discussed with patient. - discussed diagnosis and progression - not yet visually significant - monitor for now    Ophthalmic Meds Ordered this visit:  No orders of the defined types were placed in this encounter.      Return if symptoms worsen or fail to improve.  There are no Patient Instructions on file for this visit.   Explained the diagnoses, plan, and follow up with the patient and they expressed understanding.  Patient expressed understanding of the importance of proper follow up care.   This document serves as a record of services personally performed by Gardiner Sleeper, MD, PhD. It was created on their behalf by Ernest Mallick, OA, an ophthalmic assistant. The creation of this record is the provider's dictation  and/or activities during the visit.    Electronically signed by: Ernest Mallick, OA  01.23.2020 12:35 PM    Gardiner Sleeper, M.D., Ph.D. Diseases & Surgery of the Retina and Vitreous [facility]  I have reviewed the above documentation for accuracy and completeness, and I agree with the above. Gardiner Sleeper, M.D., Ph.D. 12/22/18 12:36 PM    Abbreviations: M myopia (nearsighted); A astigmatism; H hyperopia (farsighted); P presbyopia; Mrx spectacle prescription;  CTL contact lenses; OD right eye; OS left eye; OU both eyes  XT exotropia; ET esotropia; PEK punctate epithelial keratitis; PEE punctate epithelial erosions; DES dry eye syndrome; MGD meibomian gland dysfunction; ATs artificial tears; PFAT's preservative free artificial tears; Exline nuclear sclerotic cataract; PSC posterior subcapsular cataract; ERM epi-retinal membrane; PVD posterior vitreous detachment; RD retinal detachment; DM diabetes mellitus; DR diabetic retinopathy; NPDR non-proliferative diabetic retinopathy; PDR proliferative diabetic retinopathy; CSME clinically significant macular edema; DME diabetic macular edema; dbh dot blot hemorrhages; CWS cotton wool spot; POAG primary open angle glaucoma; C/D cup-to-disc ratio; HVF humphrey visual field; GVF goldmann visual field; OCT optical coherence tomography; IOP intraocular pressure; BRVO Branch retinal vein occlusion; CRVO central retinal vein occlusion; CRAO central retinal artery occlusion; BRAO branch retinal artery occlusion; RT retinal tear; SB scleral buckle; PPV pars plana vitrectomy; VH Vitreous hemorrhage; PRP panretinal laser photocoagulation; IVK intravitreal kenalog; VMT vitreomacular traction; MH Macular hole;  NVD neovascularization of the disc; NVE neovascularization elsewhere; AREDS age related eye disease study; ARMD age related macular degeneration; POAG primary open angle glaucoma; EBMD epithelial/anterior basement membrane dystrophy; ACIOL  anterior chamber intraocular lens; IOL intraocular lens; PCIOL posterior chamber intraocular lens; Phaco/IOL phacoemulsification with intraocular lens placement; [place] photorefractive keratectomy; LASIK laser assisted in situ keratomileusis; HTN hypertension; DM diabetes mellitus; COPD chronic obstructive pulmonary disease

## 2018-12-19 ENCOUNTER — Ambulatory Visit (INDEPENDENT_AMBULATORY_CARE_PROVIDER_SITE_OTHER): Payer: Medicare HMO | Admitting: Ophthalmology

## 2018-12-19 ENCOUNTER — Encounter (INDEPENDENT_AMBULATORY_CARE_PROVIDER_SITE_OTHER): Payer: Self-pay | Admitting: Ophthalmology

## 2018-12-19 DIAGNOSIS — H43811 Vitreous degeneration, right eye: Secondary | ICD-10-CM | POA: Diagnosis not present

## 2018-12-19 DIAGNOSIS — H353131 Nonexudative age-related macular degeneration, bilateral, early dry stage: Secondary | ICD-10-CM

## 2018-12-19 DIAGNOSIS — D3132 Benign neoplasm of left choroid: Secondary | ICD-10-CM | POA: Diagnosis not present

## 2018-12-19 DIAGNOSIS — H3581 Retinal edema: Secondary | ICD-10-CM

## 2018-12-19 DIAGNOSIS — H33331 Multiple defects of retina without detachment, right eye: Secondary | ICD-10-CM

## 2018-12-19 DIAGNOSIS — H25813 Combined forms of age-related cataract, bilateral: Secondary | ICD-10-CM | POA: Diagnosis not present

## 2018-12-22 ENCOUNTER — Encounter: Payer: Self-pay | Admitting: Skilled Nursing Facility1

## 2018-12-22 ENCOUNTER — Encounter: Payer: Medicare HMO | Attending: Surgery | Admitting: Skilled Nursing Facility1

## 2018-12-22 ENCOUNTER — Encounter (INDEPENDENT_AMBULATORY_CARE_PROVIDER_SITE_OTHER): Payer: Self-pay | Admitting: Ophthalmology

## 2018-12-22 DIAGNOSIS — Z905 Acquired absence of kidney: Secondary | ICD-10-CM | POA: Diagnosis not present

## 2018-12-22 DIAGNOSIS — Z79899 Other long term (current) drug therapy: Secondary | ICD-10-CM | POA: Diagnosis not present

## 2018-12-22 DIAGNOSIS — Z841 Family history of disorders of kidney and ureter: Secondary | ICD-10-CM | POA: Diagnosis not present

## 2018-12-22 DIAGNOSIS — Z9049 Acquired absence of other specified parts of digestive tract: Secondary | ICD-10-CM | POA: Diagnosis not present

## 2018-12-22 DIAGNOSIS — Z713 Dietary counseling and surveillance: Secondary | ICD-10-CM | POA: Insufficient documentation

## 2018-12-22 DIAGNOSIS — Z8249 Family history of ischemic heart disease and other diseases of the circulatory system: Secondary | ICD-10-CM | POA: Insufficient documentation

## 2018-12-22 DIAGNOSIS — E669 Obesity, unspecified: Secondary | ICD-10-CM

## 2018-12-22 DIAGNOSIS — Z888 Allergy status to other drugs, medicaments and biological substances status: Secondary | ICD-10-CM | POA: Insufficient documentation

## 2018-12-22 DIAGNOSIS — K219 Gastro-esophageal reflux disease without esophagitis: Secondary | ICD-10-CM | POA: Diagnosis not present

## 2018-12-22 DIAGNOSIS — Z6841 Body Mass Index (BMI) 40.0 and over, adult: Secondary | ICD-10-CM | POA: Insufficient documentation

## 2018-12-22 DIAGNOSIS — Z818 Family history of other mental and behavioral disorders: Secondary | ICD-10-CM | POA: Diagnosis not present

## 2018-12-22 DIAGNOSIS — Z823 Family history of stroke: Secondary | ICD-10-CM | POA: Diagnosis not present

## 2018-12-22 DIAGNOSIS — Z8261 Family history of arthritis: Secondary | ICD-10-CM | POA: Insufficient documentation

## 2018-12-22 DIAGNOSIS — Z8379 Family history of other diseases of the digestive system: Secondary | ICD-10-CM | POA: Insufficient documentation

## 2018-12-22 NOTE — Progress Notes (Signed)
Pre-Operative Nutrition Class:  Appt start time: 2956   End time:  1830.  Patient was seen on 12/22/2018 for Pre-Operative Bariatric Surgery [facility].   Surgery date:  Surgery type: RYG Start weight at [facility]: 225.8 Weight today: 220.4  Samples given per MNT protocol. Patient educated on appropriate usage: Bariatric Advantage Multivitamin Lot # [id number] Exp:4/21  Bariatric Advantage Calcium  Lot #  [id number] Exp:  may-01-2019  Premier Protein Shake Lot # [id number] Exp: February 23, 2019  The following the learning objectives were met by the patient during this course:  Identify Pre-Op Dietary Goals and will begin 2 weeks pre-operatively  Identify appropriate sources of fluids and proteins   State protein recommendations and appropriate sources pre and post-operatively  Identify Post-Operative Dietary Goals and will follow for 2 weeks post-operatively  Identify appropriate multivitamin and calcium sources  Describe the need for physical activity post-operatively and will follow MD recommendations  State when to call healthcare provider regarding medication questions or post-operative complications  Handouts given during class include:  Pre-Op Bariatric Surgery Diet Handout  Protein Shake Handout  Post-Op Bariatric Surgery Nutrition Handout  BELT Program Information Flyer  Support Group Information Flyer  [facility] Bariatric Supplements Price List  Follow-Up Plan: Patient will follow-up at [facility] 2 weeks post operatively for diet advancement per MD.

## 2018-12-23 DIAGNOSIS — M25562 Pain in left knee: Secondary | ICD-10-CM | POA: Diagnosis not present

## 2018-12-23 DIAGNOSIS — M25561 Pain in right knee: Secondary | ICD-10-CM | POA: Diagnosis not present

## 2018-12-23 DIAGNOSIS — M79671 Pain in right foot: Secondary | ICD-10-CM | POA: Diagnosis not present

## 2018-12-24 ENCOUNTER — Telehealth: Payer: Self-pay | Admitting: Skilled Nursing Facility1

## 2018-12-24 ENCOUNTER — Other Ambulatory Visit: Payer: Self-pay | Admitting: Family Medicine

## 2018-12-24 DIAGNOSIS — E785 Hyperlipidemia, unspecified: Secondary | ICD-10-CM

## 2018-12-24 NOTE — Telephone Encounter (Signed)
Pt called asking if she can still take her eye medication.   Dietitian advised she ask her surgeon at her pre-op appointment.

## 2018-12-25 DIAGNOSIS — M25561 Pain in right knee: Secondary | ICD-10-CM | POA: Diagnosis not present

## 2018-12-30 DIAGNOSIS — M25561 Pain in right knee: Secondary | ICD-10-CM | POA: Diagnosis not present

## 2019-01-05 DIAGNOSIS — M25561 Pain in right knee: Secondary | ICD-10-CM | POA: Diagnosis not present

## 2019-01-07 ENCOUNTER — Other Ambulatory Visit: Payer: Self-pay | Admitting: Family Medicine

## 2019-01-07 DIAGNOSIS — M79671 Pain in right foot: Secondary | ICD-10-CM | POA: Diagnosis not present

## 2019-01-07 DIAGNOSIS — M25562 Pain in left knee: Secondary | ICD-10-CM | POA: Diagnosis not present

## 2019-01-07 DIAGNOSIS — M25561 Pain in right knee: Secondary | ICD-10-CM | POA: Diagnosis not present

## 2019-01-07 DIAGNOSIS — E785 Hyperlipidemia, unspecified: Secondary | ICD-10-CM

## 2019-01-08 DIAGNOSIS — M797 Fibromyalgia: Secondary | ICD-10-CM | POA: Diagnosis not present

## 2019-01-08 DIAGNOSIS — G609 Hereditary and idiopathic neuropathy, unspecified: Secondary | ICD-10-CM | POA: Diagnosis not present

## 2019-01-13 DIAGNOSIS — M25562 Pain in left knee: Secondary | ICD-10-CM | POA: Diagnosis not present

## 2019-01-16 DIAGNOSIS — G4733 Obstructive sleep apnea (adult) (pediatric): Secondary | ICD-10-CM | POA: Diagnosis not present

## 2019-01-19 DIAGNOSIS — M25561 Pain in right knee: Secondary | ICD-10-CM | POA: Diagnosis not present

## 2019-01-19 DIAGNOSIS — M79671 Pain in right foot: Secondary | ICD-10-CM | POA: Diagnosis not present

## 2019-01-19 DIAGNOSIS — M25562 Pain in left knee: Secondary | ICD-10-CM | POA: Diagnosis not present

## 2019-01-20 DIAGNOSIS — M79671 Pain in right foot: Secondary | ICD-10-CM | POA: Diagnosis not present

## 2019-01-22 DIAGNOSIS — M25561 Pain in right knee: Secondary | ICD-10-CM | POA: Diagnosis not present

## 2019-01-29 DIAGNOSIS — M25562 Pain in left knee: Secondary | ICD-10-CM | POA: Diagnosis not present

## 2019-02-09 ENCOUNTER — Telehealth: Payer: Self-pay | Admitting: Family Medicine

## 2019-02-09 ENCOUNTER — Encounter: Payer: Self-pay | Admitting: *Deleted

## 2019-02-09 MED ORDER — OSELTAMIVIR PHOSPHATE 75 MG PO CAPS: ORAL_CAPSULE | ORAL | 0 refills | Status: DC

## 2019-02-09 NOTE — Telephone Encounter (Signed)
Advise pt that rx was sent. Instructions are that she take it once daily for a week for PREVENTION. If she develops symptoms, it should be taken twice daily for 5 days for treatment.

## 2019-02-09 NOTE — Telephone Encounter (Signed)
Patient advised.

## 2019-02-09 NOTE — Telephone Encounter (Signed)
Pt called and states that her grandson was just diagnosed with the flu and she is going to have to help look after him since school is out, she is wanting to know if she can get a RX of the tamiflu pt uses [id number] - [place], [place] - [address] and pt can be reached at [phone]

## 2019-02-14 DIAGNOSIS — G4733 Obstructive sleep apnea (adult) (pediatric): Secondary | ICD-10-CM | POA: Diagnosis not present

## 2019-03-07 ENCOUNTER — Telehealth: Payer: Self-pay | Admitting: Rheumatology

## 2019-03-09 NOTE — Telephone Encounter (Signed)
Per telephone conversation with Dr. Estanislado Pandy, both medications are ok to refill.  Called pharmacy to provide verbal prescription for armodafinil.

## 2019-03-09 NOTE — Telephone Encounter (Signed)
Last Visit: 11/11/2018 Next Visit: 05/13/2019  Last fill: 12/09/2018  Okay to refill armodafinil?

## 2019-03-10 NOTE — Telephone Encounter (Signed)
OK to refill both medications.

## 2019-03-17 ENCOUNTER — Telehealth: Payer: Self-pay | Admitting: Cardiology

## 2019-03-17 ENCOUNTER — Telehealth: Payer: Self-pay

## 2019-03-17 DIAGNOSIS — G4733 Obstructive sleep apnea (adult) (pediatric): Secondary | ICD-10-CM | POA: Diagnosis not present

## 2019-03-17 NOTE — Telephone Encounter (Signed)
Spoke with pt and she stated that she had just finished a phone call giving consent and informing her of the change to a video visit with Dr. Radford Pax.

## 2019-03-17 NOTE — Telephone Encounter (Signed)
Video/[email or web address]/phone/verbal consent 03/17/19/vitals  YOUR CARDIOLOGY TEAM HAS ARRANGED FOR AN E-VISIT FOR YOUR APPOINTMENT - PLEASE REVIEW IMPORTANT INFORMATION BELOW SEVERAL DAYS PRIOR TO YOUR APPOINTMENT  Due to the recent COVID-19 pandemic, we are transitioning in-person office visits to tele-medicine visits in an effort to decrease unnecessary exposure to our patients, their families, and staff. These visits are billed to your insurance just like a normal visit is. We also encourage you to sign up for MyChart if you have not already done so. You will need a smartphone if possible. For patients that do not have this, we can still complete the visit using a regular telephone but do prefer a smartphone to enable video when possible. You may have a family member that lives with you that can help. If possible, we also ask that you have a blood pressure cuff and scale at home to measure your blood pressure, heart rate and weight prior to your scheduled appointment. Patients with clinical needs that need an in-person evaluation and testing will still be able to come to the office if absolutely necessary. If you have any questions, feel free to call our office.  [email or web address]      YOUR PROVIDER WILL BE USING THE FOLLOWING PLATFORM TO COMPLETE YOUR VISITIF USING MYCHART - How to Download the MyChart App to Your SmartPhone   - If Apple, go to [employer] and type in MyChart in the search bar and download the app. If Android, ask patient to go to [employer] and type in [place] in the search bar and download the app. The app is free but as with any other app downloads, your phone may require you to verify saved payment information or Apple/Android password.  - You will need to then log into the app with your MyChart username and password, and select [facility] as your healthcare provider to link the account.  - When it is time for your visit, go to the MyChart app, find appointments, and click Begin Video  Visit. Be sure to Select Allow for your device to access the Microphone and Camera for your visit. You will then be connected, and your provider will be with you shortly.  **If you have any issues connecting or need assistance, please contact MyChart service desk [phone])**  **If using a computer, in order to ensure the best quality for your visit, you will need to use either of the following Internet Browsers: Insurance underwriter or Microsoft Edge**   IF USING DOXIMITY or [email or web address] - The staff will give you instructions on receiving your link to join the meeting the day of your visit.      2-3 DAYS BEFORE YOUR APPOINTMENT  You will receive a telephone call from one of our [facility] team members - your caller ID may say "Unknown caller." If this is a video visit, we will walk you through how to get the video launched on your phone. We will remind you check your blood pressure, heart rate and weight prior to your scheduled appointment. If you have an Apple Watch or Kardia, please upload any pertinent ECG strips the day before or morning of your appointment to [facility]. Our staff will also make sure you have reviewed the consent and agree to move forward with your scheduled tele-health visit.     THE DAY OF YOUR APPOINTMENT  Approximately 15 minutes prior to your scheduled appointment, you will receive a telephone call from one of [facility] team - your caller ID may say "  Unknown caller."  Our staff will confirm medications, vital signs for the day and any symptoms you may be experiencing. Please have this information available prior to the time of visit start. It may also be helpful for you to have a pad of paper and pen handy for any instructions given during your visit. They will also walk you through joining the smartphone meeting if this is a video visit.    CONSENT FOR TELE-HEALTH VISIT - PLEASE REVIEW  I hereby voluntarily request, consent and authorize [facility] and its  employed or contracted physicians, physician assistants, nurse practitioners or other licensed health care professionals (the Practitioner), to provide me with telemedicine health care services (the Services") as deemed necessary by the treating Practitioner. I acknowledge and consent to receive the Services by the Practitioner via telemedicine. I understand that the telemedicine visit will involve communicating with the Practitioner through live audiovisual communication technology and the disclosure of certain medical information by electronic transmission. I acknowledge that I have been given the opportunity to request an in-person assessment or other available alternative prior to the telemedicine visit and am voluntarily participating in the telemedicine visit.  I understand that I have the right to withhold or withdraw my consent to the use of telemedicine in the course of my care at any time, without affecting my right to future care or treatment, and that the Practitioner or I may terminate the telemedicine visit at any time. I understand that I have the right to inspect all information obtained and/or recorded in the course of the telemedicine visit and may receive copies of available information for a reasonable fee.  I understand that some of the potential risks of receiving the Services via telemedicine include:   Delay or interruption in medical evaluation due to technological equipment failure or disruption;  Information transmitted may not be sufficient (e.g. poor resolution of images) to allow for appropriate medical decision making by the Practitioner; and/or   In rare instances, security protocols could fail, causing a breach of personal health information.  Furthermore, I acknowledge that it is my responsibility to provide information about my medical history, conditions and care that is complete and accurate to the best of my ability. I acknowledge that Practitioner's advice,  recommendations, and/or decision may be based on factors not within their control, such as incomplete or inaccurate data provided by me or distortions of diagnostic images or specimens that may result from electronic transmissions. I understand that the practice of medicine is not an exact science and that Practitioner makes no warranties or guarantees regarding treatment outcomes. I acknowledge that I will receive a copy of this consent concurrently upon execution via email to the email address I last provided but may also request a printed copy by calling the office of [facility].    I understand that my insurance will be billed for this visit.   I have read or had this consent read to me.  I understand the contents of this consent, which adequately explains the benefits and risks of the Services being provided via telemedicine.   I have been provided ample opportunity to ask questions regarding this consent and the Services and have had my questions answered to my satisfaction.  I give my informed consent for the services to be provided through the use of telemedicine in my medical care  By participating in this telemedicine visit I agree to the above.

## 2019-03-18 NOTE — Progress Notes (Signed)
Virtual Visit via Video Note   This visit type was conducted due to national recommendations for restrictions regarding the COVID-19 Pandemic (e.g. social distancing) in an effort to limit this patient's exposure and mitigate transmission in our community.  Due to her co-morbid illnesses, this patient is at least at moderate risk for complications without adequate follow up.  This format is felt to be most appropriate for this patient at this time.  All issues noted in this document were discussed and addressed.  A limited physical exam was performed with this format.  Please refer to the patient's chart for her consent to telehealth for [facility].  Evaluation Performed:  Follow-up visit  This visit type was conducted due to national recommendations for restrictions regarding the COVID-19 Pandemic (e.g. social distancing).  This format is felt to be most appropriate for this patient at this time.  All issues noted in this document were discussed and addressed.  No physical exam was performed (except for noted visual exam findings with Video Visits).  Please refer to the patient's chart (MyChart message for video visits and phone note for telephone visits) for the patient's consent to telehealth for [facility].  Date:  03/19/2019   ID:  [name], DOB 11-03-1956, MRN [id number]  Patient Location:  Home  Provider location:   [place]  PCP:  Rita Ohara, MD  Cardiologist:  Jenkins Rouge, MD  Electrophysiologist:  None   Chief Complaint:  OSA  History of Present Illness:    [name] is a 63 y.o. female who presents via audio/video conferencing for a telehealth visit today.    [name] is a 63 y.o. female with a hx of chest pain with normal heart cath, mild pulmonary HTN at cath with normal LVEDP followed by Dr. Johnsie Cancel.  She also has obesity and OSA on CPAP which I follow. She is doing well with her CPAP device and thinks that she has gotten used to it.  She tolerates  the mask and feels the pressure is adequate.  Since going on CPAP she feels rested in the am and has no significant daytime sleepiness.  She denies any significant mouth or nasal dryness or nasal congestion.  She does not think that he snores.    The patient does not have symptoms concerning for COVID-19 infection (fever, chills, cough, or new shortness of breath).    Prior CV studies:   The following studies were reviewed today:  PAP compliance download  Past Medical History:  Diagnosis Date  . Anemia    previously followed by Dr. Jamse Arn for anemia and elevated platelets  . Anxiety   . C. difficile colitis 10/01/12   treated by [facility]  . Chronic fatigue syndrome   . DDD (degenerative disc disease), lumbar 08/19/14   and facet arthroplasty & left lumbar radiculopathy (Dr.Ramos)  . Depression   . Dyssynergia    dyssynergenic defecation, contributing to fecal incontinence.  . Edema   . Fibromyalgia   . Gastroparesis    followed at [facility]  . GERD (gastroesophageal reflux disease)   . Hyperlipidemia   . Kidney stone   . Lumbar radiculopathy   . Migraine   . Neuropathy   . Obstructive sleep apnea    Does  wear  CPAP  . Paresthesia    Dr. Everette Rank at [facility]  . Pelvic floor dysfunction    pelvic floor dyssynergy  . Plantar fasciitis 02/2011   R foot  . Pneumonia  2012  . PONV (postoperative nausea and vomiting)    pt states has gastroparesis has difficulty taking antibiotics and narcotics has severe nausea and vomiting   . Restless leg syndrome   . S/P endometrial ablation 08/09/2006   Novasure Ablation  . S/P epidural steroid injection 09/20/14   Dr.Ramos  . Tremor    Dr. Everette Rank  . Urinary frequency   . Urinary incontinence    Past Surgical History:  Procedure Laterality Date  . CHOLECYSTECTOMY  9/05  . ENDOMETRIAL ABLATION  08/09/2006   Dr. Valentina Shaggy Ablation  . FACET JOINT INJECTION  04/17/2017   Left L4-5 and L5-S1  . KNEE ARTHROPLASTY     . KNEE SURGERY  1999   R knee, Dr. Eddie Dibbles, torn cartilage  . RETINAL LASER PROCEDURE Right 08/28/2018   laser retinopexy  . RIGHT/LEFT HEART CATH AND CORONARY ANGIOGRAPHY N/A 01/01/2018   Procedure: RIGHT/LEFT HEART CATH AND CORONARY ANGIOGRAPHY;  Surgeon: Sherren Mocha, MD;  Location: [facility];  Service: Cardiovascular;  Laterality: N/A;  . TONSILLECTOMY  1968  . TONSILLECTOMY    . TOTAL KNEE ARTHROPLASTY Right 09/05/2015   Procedure: RIGHT TOTAL KNEE ARTHROPLASTY;  Surgeon: Gaynelle Arabian, MD;  Location: [facility];  Service: Orthopedics;  Laterality: Right;  . TOTAL KNEE ARTHROPLASTY Left 07/14/2018   Procedure: LEFT TOTAL KNEE ARTHROPLASTY;  Surgeon: Gaynelle Arabian, MD;  Location: [facility];  Service: Orthopedics;  Laterality: Left;  . ULTRASOUND GUIDANCE FOR VASCULAR ACCESS  01/01/2018   Procedure: Ultrasound Guidance For Vascular Access;  Surgeon: Sherren Mocha, MD;  Location: [facility];  Service: Cardiovascular;;     Current Meds  Medication Sig  . acetaminophen (TYLENOL) 500 MG tablet Take 1,000 mg by mouth 3 (three) times daily as needed for moderate pain.  Marland Kitchen albuterol (PROVENTIL HFA;VENTOLIN HFA) 108 (90 Base) MCG/ACT inhaler Inhale 1-2 puffs into the lungs every 6 (six) hours as needed for wheezing or shortness of breath.  . allopurinol (ZYLOPRIM) 300 MG tablet TAKE 1 TABLET (300 MG TOTAL) DAILY BY MOUTH.  Marland Kitchen ALPRAZolam (XANAX) 0.5 MG tablet Take 0.5 mg by mouth 3 (three) times daily as needed for anxiety.  Marland Kitchen amoxicillin (AMOXIL) 500 MG tablet Take 2 tablets (1,000 mg total) by mouth 2 (two) times daily. (Patient taking differently: Take 1,000 mg by mouth 2 (two) times daily. Before dental appointments)  . ARIPiprazole (ABILIFY) 5 MG tablet Take 5 mg by mouth daily.  . Armodafinil 250 MG tablet TAKE 1 TABLET EVERY DAY WITH BREAKFAST  . aspirin 81 MG tablet aspirin 81 mg tablet,delayed release  TAKE 1 TABLET BY MOUTH EVERY DAY  . atorvastatin (LIPITOR) 40 MG tablet TAKE 1  TABLET BY MOUTH EVERY DAY  . cetirizine (ZYRTEC) 10 MG tablet Take 10 mg by mouth at bedtime.  . cholecalciferol (VITAMIN D) 1000 units tablet Take 1,000 Units by mouth daily.  . colchicine 0.6 MG tablet Take 2 tablets by mouth at onset of gout flare. Taken an additional tablet 1 hour later, if needed. May take like this every 3d, if needed  . DULoxetine (CYMBALTA) 60 MG capsule Take 60 mg by mouth 2 (two) times daily.   . fluticasone (FLONASE) 50 MCG/ACT nasal spray PLACE 2 SPRAYS INTO BOTH NOSTRILS DAILY AS NEEDED FOR ALLERGIES  . gabapentin (NEURONTIN) 300 MG capsule Take 1 capsule (300 mg total) by mouth 3 (three) times daily. Gabapentin 300 mg Protocol Take a 300 mg capsule three times a day for two weeks following surgery. Then  take a 300 mg capsule two times a day for two weeks.  Then resume a 300 mg capsule once a day at bedtime.  . Melatonin 10 MG TABS Take 10 mg by mouth at bedtime.  . metoprolol tartrate (LOPRESSOR) 50 MG tablet Take 1 tablet (50 mg total) by mouth 2 (two) times daily.  . Multiple Vitamins-Minerals (ICAPS AREDS 2) CAPS Take 1 capsule by mouth 2 (two) times daily.  . nortriptyline (PAMELOR) 25 MG capsule Take 75 mg by mouth at bedtime.   . Omega-3 Fatty Acids (FISH OIL) 1200 MG CAPS Take 1,200 mg by mouth daily.  . ondansetron (ZOFRAN) 8 MG tablet Take 8 mg by mouth every 8 (eight) hours as needed for nausea.   . pantoprazole (PROTONIX) 40 MG tablet Take 40 mg by mouth 2 (two) times daily.  . Probiotic Product ([place]) CAPS Take 1 capsule by mouth at bedtime.   . prochlorperazine (COMPAZINE) 10 MG tablet Take 10 mg by mouth every 6 (six) hours as needed for nausea or vomiting.   Marland Kitchen Propylene Glycol (SYSTANE COMPLETE) 0.6 % SOLN Place 1 drop into both eyes 2 (two) times daily.  . ranitidine (ZANTAC) 300 MG tablet Take 300 mg by mouth 2 (two) times daily.   Marland Kitchen topiramate (TOPAMAX) 25 MG tablet TAKE 2 TABLETS (50 MG TOTAL) BY MOUTH AT BEDTIME.9  . vitamin  B-12 (CYANOCOBALAMIN) 1000 MCG tablet Take 1,000 mcg by mouth daily.  Marland Kitchen XIIDRA 5 % SOLN Apply 1 drop to eye 2 (two) times daily.     Allergies:   Erythromycin; Tramadol; and Dilaudid [hydromorphone hcl]   Social History   Tobacco Use  . Smoking status: Never Smoker  . Smokeless tobacco: Never Used  Substance Use Topics  . Alcohol use: No    Alcohol/week: 0.0 standard drinks  . Drug use: No     Family Hx: The patient's family history includes Allergies in her mother and sister; Asthma in her sister; Cancer in her maternal aunt and maternal aunt; Colon cancer in her maternal aunt; Diabetes in her father; Heart disease in her father, mother, paternal grandfather, and paternal grandmother; Hypertension in her father and mother; Irritable bowel syndrome in her sister; Kidney disease in her father; Macular degeneration in her mother; Stroke (age of onset: 55) in her father. There is no history of CAD.  ROS:   Please see the history of present illness.     All other systems reviewed and are negative.   Labs/Other Tests and Data Reviewed:    Recent Labs: 08/13/2018: ALT 23; BUN 26; Creatinine, Ser 0.96; Potassium 4.3; Sodium 142 08/27/2018: Hemoglobin 12.3; Platelets 397.0; Pro B Natriuretic peptide (BNP) 17.0   Recent Lipid Panel Lab Results  Component Value Date/Time   CHOL 128 10/09/2018 09:11 AM   TRIG 119 10/09/2018 09:11 AM   HDL 42 10/09/2018 09:11 AM   CHOLHDL 3.0 10/09/2018 09:11 AM   CHOLHDL 3.7 12/07/2017 05:00 AM   LDLCALC 62 10/09/2018 09:11 AM   LDLCALC 68 10/03/2017 10:20 AM    Wt Readings from Last 3 Encounters:  03/19/19 227 lb (103 kg)  12/22/18 220 lb 6.4 oz (100 kg)  11/12/18 218 lb 8 oz (99.1 kg)     Objective:    Vital Signs:  BP 136/74   Pulse 76   Ht 5' 1.7" (1.567 m)   Wt 227 lb (103 kg)   LMP 07/27/2006   BMI 41.92 kg/m    CONSTITUTIONAL:  Well nourished, well  developed female in no acute distress.  EYES: anicteric MOUTH: oral mucosa is  pink RESPIRATORY: Normal respiratory effort, symmetric expansion CARDIOVASCULAR: No peripheral edema SKIN: No rash, lesions or ulcers MUSCULOSKELETAL: no digital cyanosis NEURO: Cranial Nerves II-XII grossly intact, moves all extremities PSYCH: Intact judgement and insight.  A&O x 3, Mood/affect appropriate   ASSESSMENT & PLAN:    1.  OSA - the patient is tolerating PAP therapy well without any problems. The PAP download was reviewed today and showed an AHI of 5.4/hr on auto PAP with 77% compliance in using more than 4 hours nightly.  The patient has been using and benefiting from PAP use and will continue to benefit from therapy. I have encouraged her to avoid sleeping supine.   2. Obesity - I have encouraged her to get into a routine exercise program and cut back on carbs and portions.   3.  COVID-19 Education:The signs and symptoms of COVID-19 were discussed with the patient and how to seek care for testing (follow up with PCP or arrange E-visit).  The importance of social distancing was discussed today.  Patient Risk:   After full review of this patient's clinical status, I feel that they are at least moderate risk at this time.  Time:   Today, I have spent 15 minutes directly with the patient on video discussing medical problems including OSA, obesity, healthy diet and exercise.  We also reviewed the symptoms of COVID 19 and the ways to protect against contracting the virus with telehealth technology.  I spent an additional 5 minutes reviewing patient's chart including PAP compliance report.  Medication Adjustments/Labs and Tests Ordered: Current medicines are reviewed at length with the patient today.  Concerns regarding medicines are outlined above.  Tests Ordered: No orders of the defined types were placed in this encounter.  Medication Changes: No orders of the defined types were placed in this encounter.   Disposition:  Follow up in 1 year(s)  Signed, Fransico Him, MD   03/19/2019 2:27 PM    [place]

## 2019-03-19 ENCOUNTER — Other Ambulatory Visit: Payer: Self-pay

## 2019-03-19 ENCOUNTER — Encounter: Payer: Self-pay | Admitting: Family Medicine

## 2019-03-19 ENCOUNTER — Encounter: Payer: Self-pay | Admitting: Cardiology

## 2019-03-19 ENCOUNTER — Telehealth (INDEPENDENT_AMBULATORY_CARE_PROVIDER_SITE_OTHER): Payer: Medicare HMO | Admitting: Cardiology

## 2019-03-19 ENCOUNTER — Telehealth: Payer: Self-pay | Admitting: *Deleted

## 2019-03-19 VITALS — BP 136/74 | HR 76 | Ht 61.7 in | Wt 227.0 lb

## 2019-03-19 DIAGNOSIS — G4733 Obstructive sleep apnea (adult) (pediatric): Secondary | ICD-10-CM

## 2019-03-19 DIAGNOSIS — Z7189 Other specified counseling: Secondary | ICD-10-CM | POA: Diagnosis not present

## 2019-03-19 NOTE — Telephone Encounter (Signed)
-----   Message from Sarina Ill, RN sent at 03/19/2019  3:47 PM EDT ----- Dr. Radford Pax ordered CPAP supplies. Please print avs. Thanks, Suezanne Jacquet

## 2019-03-19 NOTE — Patient Instructions (Signed)
Medication Instructions:  Your physician recommends that you continue on your current medications as directed. Please refer to the Current Medication list given to you today.  If you need a refill on your cardiac medications before your next appointment, please call your pharmacy.   Lab work: None If you have labs (blood work) drawn today and your tests are completely normal, you will receive your results only by: . MyChart Message (if you have MyChart) OR . A paper copy in the mail If you have any lab test that is abnormal or we need to change your treatment, we will call you to review the results.  Testing/Procedures: None  Follow-Up: At [facility], you and your health needs are our priority.  As part of our continuing mission to provide you with exceptional heart care, we have created designated Provider Care Teams.  These Care Teams include your primary Cardiologist (physician) and Advanced Practice Providers (APPs -  Physician Assistants and Nurse Practitioners) who all work together to provide you with the care you need, when you need it. You will need a follow up appointment in 1 years.  Please call our office 2 months in advance to schedule this appointment.  You may see Dr. Turner    

## 2019-03-19 NOTE — Telephone Encounter (Signed)
Order placed to [facility] 

## 2019-03-25 ENCOUNTER — Other Ambulatory Visit: Payer: Self-pay | Admitting: Family Medicine

## 2019-03-25 DIAGNOSIS — E785 Hyperlipidemia, unspecified: Secondary | ICD-10-CM

## 2019-04-07 ENCOUNTER — Other Ambulatory Visit: Payer: Self-pay | Admitting: Rheumatology

## 2019-04-07 ENCOUNTER — Other Ambulatory Visit: Payer: Self-pay | Admitting: Family Medicine

## 2019-04-07 DIAGNOSIS — R7303 Prediabetes: Secondary | ICD-10-CM

## 2019-04-07 DIAGNOSIS — Z5181 Encounter for therapeutic drug level monitoring: Secondary | ICD-10-CM

## 2019-04-07 NOTE — Telephone Encounter (Signed)
Last Visit: 11/11/2018 Next Visit: 05/13/2019  Okay to refill per Dr. Estanislado Pandy

## 2019-04-09 ENCOUNTER — Other Ambulatory Visit: Payer: Medicare HMO

## 2019-04-10 ENCOUNTER — Telehealth: Payer: Self-pay

## 2019-04-10 ENCOUNTER — Telehealth: Payer: Self-pay | Admitting: Family Medicine

## 2019-04-10 ENCOUNTER — Other Ambulatory Visit: Payer: Self-pay

## 2019-04-10 DIAGNOSIS — G4733 Obstructive sleep apnea (adult) (pediatric): Secondary | ICD-10-CM | POA: Diagnosis not present

## 2019-04-10 MED ORDER — METOPROLOL TARTRATE 50 MG PO TABS: 50.0000 mg | ORAL_TABLET | Freq: Two times a day (BID) | ORAL | 3 refills | Status: DC

## 2019-04-10 NOTE — Telephone Encounter (Signed)
Pt was advised [facility] 

## 2019-04-10 NOTE — Telephone Encounter (Signed)
Explained to her that since she is doing better I think she will continue to improve

## 2019-04-10 NOTE — Telephone Encounter (Signed)
error 

## 2019-04-10 NOTE — Telephone Encounter (Signed)
Pt has not had B/P for three days and now is dizzy and tremors have gotten worse. Pulse was 113 and b/p was 148/95 . Pt is almost out of metoprolol and will need some. Pt would like to know if she should feel chest discomfort, dizzy . Since resuming med pt does feel better. Please advise if this is normal .

## 2019-04-13 ENCOUNTER — Encounter: Payer: Self-pay | Admitting: Family Medicine

## 2019-04-13 ENCOUNTER — Other Ambulatory Visit: Payer: Medicare HMO

## 2019-04-13 ENCOUNTER — Other Ambulatory Visit: Payer: Self-pay

## 2019-04-13 DIAGNOSIS — R7303 Prediabetes: Secondary | ICD-10-CM

## 2019-04-13 DIAGNOSIS — Z5181 Encounter for therapeutic drug level monitoring: Secondary | ICD-10-CM | POA: Diagnosis not present

## 2019-04-14 DIAGNOSIS — H04123 Dry eye syndrome of bilateral lacrimal glands: Secondary | ICD-10-CM | POA: Diagnosis not present

## 2019-04-14 DIAGNOSIS — Z79899 Other long term (current) drug therapy: Secondary | ICD-10-CM | POA: Diagnosis not present

## 2019-04-14 LAB — COMPREHENSIVE METABOLIC PANEL
ALT: 21 IU/L (ref 0–32)
AST: 21 IU/L (ref 0–40)
Albumin/Globulin Ratio: 1.5 (ref 1.2–2.2)
Albumin: 3.8 g/dL (ref 3.8–4.8)
Alkaline Phosphatase: 121 IU/L — ABNORMAL HIGH (ref 39–117)
BUN/Creatinine Ratio: 24 (ref 12–28)
BUN: 20 mg/dL (ref 8–27)
Bilirubin Total: 0.3 mg/dL (ref 0.0–1.2)
CO2: 21 mmol/L (ref 20–29)
Calcium: 9.1 mg/dL (ref 8.7–10.3)
Chloride: 109 mmol/L — ABNORMAL HIGH (ref 96–106)
Creatinine, Ser: 0.85 mg/dL (ref 0.57–1.00)
GFR calc Af Amer: 85 mL/min/{1.73_m2} (ref >59)
GFR calc non Af Amer: 74 mL/min/{1.73_m2} (ref >59)
Globulin, Total: 2.6 g/dL (ref 1.5–4.5)
Glucose: 106 mg/dL — ABNORMAL HIGH (ref 65–99)
Potassium: 4.3 mmol/L (ref 3.5–5.2)
Sodium: 145 mmol/L — ABNORMAL HIGH (ref 134–144)
Total Protein: 6.4 g/dL (ref 6.0–8.5)

## 2019-04-14 LAB — CBC WITH DIFFERENTIAL/PLATELET
Basophils Absolute: 0.1 10*3/uL (ref 0.0–0.2)
Basos: 1 %
EOS (ABSOLUTE): 0.2 10*3/uL (ref 0.0–0.4)
Eos: 3 %
Hematocrit: 36.2 % (ref 34.0–46.6)
Hemoglobin: 12.2 g/dL (ref 11.1–15.9)
Immature Grans (Abs): 0.1 10*3/uL (ref 0.0–0.1)
Immature Granulocytes: 1 %
Lymphocytes Absolute: 2.6 10*3/uL (ref 0.7–3.1)
Lymphs: 32 %
MCH: 29.9 pg (ref 26.6–33.0)
MCHC: 33.7 g/dL (ref 31.5–35.7)
MCV: 89 fL (ref 79–97)
Monocytes Absolute: 0.7 10*3/uL (ref 0.1–0.9)
Monocytes: 9 %
Neutrophils Absolute: 4.6 10*3/uL (ref 1.4–7.0)
Neutrophils: 54 %
Platelets: 296 10*3/uL (ref 150–450)
RBC: 4.08 x10E6/uL (ref 3.77–5.28)
RDW: 14.1 % (ref 11.7–15.4)
WBC: 8.2 10*3/uL (ref 3.4–10.8)

## 2019-04-14 LAB — HEMOGLOBIN A1C
Est. average glucose Bld gHb Est-mCnc: 131 mg/dL
Hgb A1c MFr Bld: 6.2 % — ABNORMAL HIGH (ref 4.8–5.6)

## 2019-04-14 NOTE — Patient Instructions (Addendum)
  HEALTH MAINTENANCE RECOMMENDATIONS:  It is recommended that you get at least 30 minutes of aerobic exercise at least 5 days/week (for weight loss, you may need as much as 60-90 minutes). This can be any activity that gets your heart rate up. This can be divided in 10-15 minute intervals if needed, but try and build up your endurance at least once a week.  Weight bearing exercise is also recommended twice weekly.  Eat a healthy diet with lots of vegetables, fruits and fiber.  "Colorful" foods have a lot of vitamins (ie green vegetables, tomatoes, red peppers, etc).  Limit sweet tea, regular sodas and alcoholic beverages, all of which has a lot of calories and sugar.  Up to 1 alcoholic drink daily may be beneficial for women (unless trying to lose weight, watch sugars).  Drink a lot of water.  Calcium recommendations are 1200-1500 mg daily (1500 mg for postmenopausal women or women without ovaries), and vitamin D 1000 IU daily.  This should be obtained from diet and/or supplements (vitamins), and calcium should not be taken all at once, but in divided doses.  Monthly self breast exams and yearly mammograms for women over the age of 38 is recommended.  Sunscreen of at least SPF 30 should be used on all sun-exposed parts of the skin when outside between the hours of 10 am and 4 pm (not just when at beach or pool, but even with exercise, golf, tennis, and yard work!)  Use a sunscreen that says "broad spectrum" so it covers both UVA and UVB rays, and make sure to reapply every 1-2 hours.  Remember to change the batteries in your smoke detectors when changing your clock times in the spring and fall. Carbon monoxide detectors are recommended for your home.  Use your seat belt every time you are in a car, and please drive safely and not be distracted with cell phones and texting while driving.   Ms. Losada , Thank you for taking time to come for your Medicare Wellness Visit. I appreciate your ongoing  commitment to your health goals. Please review the following plan we discussed and let me know if I can assist you in the future.    This is a list of the screening recommended for you and due dates:  Health Maintenance  Topic Date Due  . Flu Shot  Fall 2020  . Mammogram  06/20/2019  . Pap Smear  09/09/2021  . Colon Cancer Screening  12/04/2022  . Tetanus Vaccine  06/21/2027  .  Hepatitis C: One time screening is recommended by Center for Disease Control  (CDC) for  adults born from [birth year] through 1964.   Completed  . HIV Screening  Completed  .     Remember to get your second Shingrix vaccine through the pharmacy sometime before September (is due now, 2 months from the first shot).  We discussed trying to gradually work on your endurance--doing seated exercises for 15 minutes twice daily, and also start walking, gradually increasing distance/time. Continue your home exercises from physical therapy.  Continue to try and limit your snacks, carbs, portions in order to lose some of the weight gained during the quarantine.  If you haven't heard from Dr. Earlie Server office by mid-June, give them a call.

## 2019-04-14 NOTE — Progress Notes (Signed)
Start time: 10:05 End time: 10:56  Virtual Visit via Video Note  I connected with [name] on 04/15/2019 by a video enabled telemedicine application and verified that I am speaking with the correct person using two identifiers.  Location: Patient: home (husband is outside) Provider: home office   I discussed the limitations of evaluation and management by telemedicine and the availability of in person appointments. The patient expressed understanding and agreed to proceed. She consents to filing her insurance for this visit.  History of Present Illness:  Chief Complaint  Patient presents with  . Medicare Wellness    VIRTUAL AWV/med check. Is having some issue with SOB but it is better than it was.     Patient presents for Medicare Annual Wellness visit, as well as 6 month med check on chronic conditions.  Pre-diabetes and obesity:  She has seen [facility] in the past, considering bariatric surgery. She has already met with psychologist, and seen nutritionist through their program. Trying to work on exercise and diet.  Previously declined Metformin trial. Dr Derrill Kay had suggested a roux en y instead of the sleeve gastrectomy due to her caliber of GERD. Due for f/u and to discuss scheduling surgery with Dr. Hassell Done, but delayed due to the pandemic.  Hyperlipidemia follow-up: Patient is trying to follow a low-fat, low cholesterol diet as best she can. Compliant with medications and denies medication side effects. She takes fish oil, 1 daily. At goal on last check. Lab Results  Component Value Date   CHOL 128 10/09/2018   HDL 42 10/09/2018   LDLCALC 62 10/09/2018   TRIG 119 10/09/2018   CHOLHDL 3.0 10/09/2018   Hypertension: (never formally diagnosed, because hadbeen on diuretics for fluid retention (stopped a while ago) and beta blockers for tachycardia and tremor--but high BP's noted when not taking these medications in past). She periodically checks her BP at  home, running 120/70 range. Last week she got 90/60 for a few days, and then it went back to normal.  She felt a little lightheaded at that time. She had actually missed some pills (waiting on refill); resolved. Denies chest pain, palpitations/tachycardia.    She is under the care of Dr. Estanislado Pandy for fibromyalgia, osteoarthritis, degenerative disc disease, chronic fatigue and gout.  She prescribes armodafinil (Nuvigil) for her chronic fatigue, and is doing well overall.  She last saw her 10/2018. Methocarbamol was refilled at that visit, and PT was recommended due to frequent falls. She did get PT, and hasn't had further falls. She has follow-up scheduled in June.  Gout: She continues on 33m of allopurinol and denies any gout flares. Lab Results  Component Value Date   LABURIC 4.7 10/09/2018    She has OSA, under the care of Dr. TRadford Pax She had televisit with Dr. TRadford Paxlast month, and is doing well on her CPAP. Energy is improved, but still requires Nuvigil.  She saw neurologist (Dr. TValaria Good in February 2020. She is on gabapentin 300 mg 3 times daily, helping with restless legs as well as her neuropathy pain.  She sees him for migraines (he stated he may consider challenge with triptan agents), tremor (benign essential vs accentuated physiological tremor), neuropathy (pedal paresthesias), and restless leg syndrome. He has also noted episodic asterixis and transient R hand paresthesias, possible median neuropathy.  Migraines:Takes topamax and pamelor for prevention. Gets migraines every 2 months.  They relieved by OTC medication most of the time. She is having more headaches, not migraines.  Relieved  by Tylenol. Sometimes they are sinus headaches. She tried lidocaine cream for her feet, didn't really help, so hasn't continued it. She continues to have some discomfort (burning) from neuropathy; she doesn't want to increase the meds suggested (nortriptyline, gabapentin) due to potential side  effects.  At most is a 6/10.  Depression:Sees Rollene Fare (at Dr. Arvil Persons prior practice), doing "pretty good".  She is doing better now than earlier on in the pandemic.  At that time, she was eating more, being at home more and stressed. No recent medication changes.   She has had dyspnea on exertion since her knee surgery in August, 2019. It is episodic. She has seen cardiologist and pulmonologist.  Dr. Halford Chessman felt that given that extensive testing was unrevealing for a cause (cardiac/pulmonary), symptoms may be related to obesity and deconditioning.  She had no benefit from Qvar trial. She was doing better until the past few weeks.  She started getting out of breath with walking short distances, but it now seems to be improving again. Hasn't been getting much exercise since quarantine.  S/p L TKR.  Last saw Dr. Wynelle Link in March, reported having some difficulty with stairs.  Was encouraged (prior to the pandemic) to join a gym and work on Hotel manager. She continues to do her home exercises.  GERD and gastroparesis is stable. She last saw GI in January 2020.  At that time she reported fairly good control of her reflux symptoms, occasional burning in her chest relieved by Tums. She takes Protonix twice daily and Zantac twice daily. She still had Zantac up until recently.  She was just changed to famotidine 32m BID--just picked up the rx, hasn't started it yet. History of gastroparesis--her last gastric emptying study was normal. She was told to continue gastroparesis dietary parameters. She had a bout of nausea this morning, needed to zofran.  Usually needs medication at least a couple of times/week.  Immunization History  Administered Date(s) Administered  . Influenza Split 09/17/2011, 09/03/2012, 09/04/2013  . Influenza,inj,Quad PF,6+ Mos 08/16/2014, 07/27/2015, 07/31/2017, 08/13/2018  . Influenza-Unspecified 10/04/2016, 07/31/2017, 09/23/2017  . Tdap 01/16/2011, 06/20/2017  . Zoster  Recombinat (Shingrix) 02/05/2019   Last Pap smear: 08/2018, Dr. FCyndie ChimeLast mammogram: 05/2018 [place] [facility] [facility] [facility] [place]; repeat recommended in 5 years Dentist: twice yearly Ophtho:yearly Exercise:limited; doing home exercise program, no other regular exercise. Vitamin D-OH 32 in 11/2015 Thyroid: Lab Results  Component Value Date   TSH 1.632 12/06/2017    Other doctors caring for patient include: GYN:Dr. Fontaine Neuro: Dr. TEverette Rank Sleep med/OSA: Dr. TFransico Him((has previously seen Dr. DBrett Fairyfor OThayer Jewpast) Rheum: Dr. DEstanislado PandyPsych: (previously Dr. MCaprice Beaver, (Rollene Fare) GI: Dr. KDerrill Kay Dr. FRoney MansPodiatry: Dr. HMilinda PointerOphtho:at [facility] Dentist:Dr. CLadona HornsUrologist: Dr. WJeffie PollockPulmonologist: Dr. NDeland PrettyCardiologist: Dr. NJohnsie Cancel Orthopedist: [place], [place] Derm: Dr. LUbaldo GlassingBariatric surgeon: Dr. MHassell Done Depression screen: negative Fall screen: 2, one resulted in fracture (foot)--lost balance once, can't recall the other cause, was told it was related to her knees being weak; she has since had PT and no further falls. Functional Status Survey: notable for shortness of breath with activity, leakage of urine  Mini-Cog screen: normal (clock visualized over video) See full screens in Epic  She has advanced directives (living will, healthcare power of attorney).  PMH, PSH, SH and FH were updated and reviewed  Outpatient Encounter Medications as of 04/15/2019  Medication Sig Note  . allopurinol (ZYLOPRIM) 300 MG tablet TAKE 1 TABLET (300  MG TOTAL) DAILY BY MOUTH.   Marland Kitchen ALPRAZolam (XANAX) 0.5 MG tablet Take 0.5 mg by mouth 3 (three) times daily as needed for anxiety. 04/15/2019: Uses it most days, once a day  . ARIPiprazole (ABILIFY) 5 MG tablet Take 5 mg by mouth daily.   . Armodafinil 250 MG tablet TAKE 1 TABLET EVERY DAY WITH BREAKFAST   . aspirin 81 MG tablet aspirin 81 mg tablet,delayed release  TAKE 1 TABLET BY MOUTH  EVERY DAY   . atorvastatin (LIPITOR) 40 MG tablet TAKE 1 TABLET BY MOUTH EVERY DAY   . cetirizine (ZYRTEC) 10 MG tablet Take 10 mg by mouth at bedtime.   . cholecalciferol (VITAMIN D) 1000 units tablet Take 1,000 Units by mouth daily.   . DULoxetine (CYMBALTA) 60 MG capsule Take 60 mg by mouth 2 (two) times daily.    . fluticasone (FLONASE) 50 MCG/ACT nasal spray PLACE 2 SPRAYS INTO BOTH NOSTRILS DAILY AS NEEDED FOR ALLERGIES   . gabapentin (NEURONTIN) 300 MG capsule Take 1 capsule (300 mg total) by mouth 3 (three) times daily. Gabapentin 300 mg Protocol Take a 300 mg capsule three times a day for two weeks following surgery. Then take a 300 mg capsule two times a day for two weeks.  Then resume a 300 mg capsule once a day at bedtime. 10/13/2018: Taking 342m TID  . Melatonin 10 MG TABS Take 10 mg by mouth at bedtime.   . metoprolol tartrate (LOPRESSOR) 50 MG tablet Take 1 tablet (50 mg total) by mouth 2 (two) times daily.   . Multiple Vitamins-Minerals (ICAPS AREDS 2) CAPS Take 1 capsule by mouth 2 (two) times daily.   . nortriptyline (PAMELOR) 25 MG capsule Take 75 mg by mouth at bedtime.    . Omega-3 Fatty Acids (FISH OIL) 1200 MG CAPS Take 1,200 mg by mouth daily.   . ondansetron (ZOFRAN) 8 MG tablet Take 8 mg by mouth every 8 (eight) hours as needed for nausea.    . pantoprazole (PROTONIX) 40 MG tablet Take 40 mg by mouth 2 (two) times daily.   . Probiotic Product (PStagecoach CAPS Take 1 capsule by mouth at bedtime.    .Marland KitchenPropylene Glycol (SYSTANE COMPLETE) 0.6 % SOLN Place 1 drop into both eyes 2 (two) times daily.   .Marland Kitchentopiramate (TOPAMAX) 25 MG tablet TAKE 2 TABLETS (50 MG TOTAL) BY MOUTH AT BEDTIME.9   . vitamin B-12 (CYANOCOBALAMIN) 1000 MCG tablet Take 1,000 mcg by mouth daily.   .Marland KitchenXIIDRA 5 % SOLN Apply 1 drop to eye 2 (two) times daily.   .Marland Kitchenacetaminophen (TYLENOL) 500 MG tablet Take 1,000 mg by mouth 3 (three) times daily as needed for moderate pain.   .Marland Kitchenalbuterol  (PROVENTIL HFA;VENTOLIN HFA) 108 (90 Base) MCG/ACT inhaler Inhale 1-2 puffs into the lungs every 6 (six) hours as needed for wheezing or shortness of breath. (Patient not taking: Reported on 04/15/2019)   . colchicine 0.6 MG tablet Take 2 tablets by mouth at onset of gout flare. Taken an additional tablet 1 hour later, if needed. May take like this every 3d, if needed (Patient not taking: Reported on 04/15/2019)   . famotidine (PEPCID) 40 MG tablet Take 40 mg by mouth 2 (two) times daily. 04/15/2019: Will start when she runs out of ranitidine  . methocarbamol (ROBAXIN) 500 MG tablet TAKE 1 TABLET (500 MG TOTAL) BY MOUTH 2 (TWO) TIMES DAILY AS NEEDED FOR MUSCLE SPASMS. (Patient not taking: Reported on 04/15/2019)   .  prochlorperazine (COMPAZINE) 10 MG tablet Take 10 mg by mouth every 6 (six) hours as needed for nausea or vomiting.    . ranitidine (ZANTAC) 300 MG tablet Take 300 mg by mouth 2 (two) times daily.  04/15/2019: Changed to famotidine 11m BID, hasn't yet started it  . [DISCONTINUED] amoxicillin (AMOXIL) 500 MG tablet Take 2 tablets (1,000 mg total) by mouth 2 (two) times daily. (Patient not taking: Reported on 04/15/2019)   . [DISCONTINUED] carbamazepine (TEGRETOL XR) 100 MG 12 hr tablet Take 200 mg by mouth daily.     . [DISCONTINUED] Eszopiclone 3 MG TABS Take 3 mg by mouth at bedtime. Take immediately before bedtime    No facility-administered encounter medications on file as of 04/15/2019.     ROS: The patient denies fever,vision changes, decreased hearing, breast concerns, palpitations, dizziness, syncope, cough, swelling, bowel changes (goes back and forth with diarrhea and constipation, per her usual), no abdominal pain, melena, hematochezia, indigestion/heartburn (controlled, per HPI), hematuria, dysuria, vaginal bleeding, discharge, odor or itch, genital lesions, weakness, suspicious skin lesions, abnormal bleeding/bruising, or enlarged lymph nodes. dyspnea on exertion--see HPI Chronic  problems per HPI, stable (tremor, depression, arthritis/fibromyalgia (neck/shoulder pain),neuropathy).  See HPI. Chronic urinary leakage "forever" Headaches per HPI    Observations/Objective:  BP 136/76   Pulse 80   Ht 5' 2.5" (1.588 m)   Wt 230 lb (104.3 kg)   LMP 07/27/2006   BMI 41.40 kg/m    Wt Readings from Last 3 Encounters:  03/19/19 227 lb (103 kg)  12/22/18 220 lb 6.4 oz (100 kg)  11/12/18 218 lb 8 oz (99.1 kg)   Exam limited due to the virtual nature of the visit. She appears obese.  She is alert, oriented, in good spirits, with full range of affect.  Normal eye contact, speech, cranial nerves grossly intact.  Lab Results  Component Value Date   HGBA1C 6.2 (H) 04/13/2019   Lab Results  Component Value Date   WBC 8.2 04/13/2019   HGB 12.2 04/13/2019   HCT 36.2 04/13/2019   MCV 89 04/13/2019   PLT 296 04/13/2019     Chemistry      Component Value Date/Time   NA 145 (H) 04/13/2019 0940   K 4.3 04/13/2019 0940   CL 109 (H) 04/13/2019 0940   CO2 21 04/13/2019 0940   BUN 20 04/13/2019 0940   CREATININE 0.85 04/13/2019 0940   CREATININE 0.74 10/03/2017 1020      Component Value Date/Time   CALCIUM 9.1 04/13/2019 0940   ALKPHOS 121 (H) 04/13/2019 0940   AST 21 04/13/2019 0940   ALT 21 04/13/2019 0940   BILITOT 0.3 04/13/2019 0940     Fasting glucose 106  Assessment and Plan:  Medicare annual wellness visit, initial  Pre-diabetes - IFG; encouraged daily exercise, weight loss, cutting back on portions, carbs  Mixed hyperlipidemia - at goal per last check; CPM  Fibromyalgia - stable, under care of Dr. DEstanislado Pandy OSA (obstructive sleep apnea) - continue nightly CPAP use  Idiopathic peripheral neuropathy - borderline control on current regimen, per neurologist  Migraine without status migrainosus, not intractable, unspecified migraine type - stable  Acute gout of right foot, unspecified cause - doing well on allopurinol without any  flares  Gastroesophageal reflux disease, esophagitis presence not specified - stable on current regimen of PPI and H2 blocker BID.    Class 3 severe obesity due to excess calories with serious comorbidity and body mass index (BMI) of 40.0 to 44.9 in  adult [facility]) - f/u with bariatric surgeons. In the interim, to work on diet, portions, exercise to lose some of the weight that she has gained recently  Gastroparesis - stable/improved, with periodic use of zofran for nausea   Discussed monthly self breast exams and yearly mammograms; at least 30 minutes of aerobic activity at least 5 days/week, weight-bearing exercise at least 2x/wk; proper sunscreen use reviewed; healthy diet, including goals of calcium and vitamin D intake and alcohol recommendations (less than or equal to 1 drink/day) reviewed; regular seatbelt use; changing batteries in smoke detectors, carbon monoxide detectors. Immunization recommendations discussed, due for second Shingrix.Colonoscopy is UTD per her GI.  Follow Up Instructions:  After visit summary will be printed and mailed (she since isn't active on MyChart).  I discussed the assessment and treatment plan with the patient. The patient was provided an opportunity to ask questions and all were answered. The patient agreed with the plan and demonstrated an understanding of the instructions.   The patient was advised to call back or seek an in-person evaluation if the symptoms worsen or if the condition fails to improve as anticipated.  I provided 51 minutes of non-face-to-face time during this encounter.   Vikki Ports, MD   Medicare Attestation I have personally reviewed: The patient's medical and social history Their use of alcohol, tobacco or illicit drugs Their current medications and supplements The patient's functional ability including ADLs,fall risks, home safety risks, cognitive, and hearing and visual impairment Diet and physical activities Evidence for  depression or mood disorders  The patient's weight, heightandBMI have been recorded in the chart. I have made referrals, counseling, and provided education to the patient based on review of the above and I have provided the patient with a written personalized care plan for preventive services.

## 2019-04-15 ENCOUNTER — Ambulatory Visit (INDEPENDENT_AMBULATORY_CARE_PROVIDER_SITE_OTHER): Payer: Medicare HMO | Admitting: Family Medicine

## 2019-04-15 ENCOUNTER — Other Ambulatory Visit: Payer: Self-pay

## 2019-04-15 ENCOUNTER — Encounter: Payer: Self-pay | Admitting: Family Medicine

## 2019-04-15 VITALS — BP 136/76 | HR 80 | Ht 62.5 in | Wt 230.0 lb

## 2019-04-15 DIAGNOSIS — Z6841 Body Mass Index (BMI) 40.0 and over, adult: Secondary | ICD-10-CM

## 2019-04-15 DIAGNOSIS — Z0000 Encounter for general adult medical examination without abnormal findings: Secondary | ICD-10-CM

## 2019-04-15 DIAGNOSIS — K219 Gastro-esophageal reflux disease without esophagitis: Secondary | ICD-10-CM

## 2019-04-15 DIAGNOSIS — G43909 Migraine, unspecified, not intractable, without status migrainosus: Secondary | ICD-10-CM | POA: Diagnosis not present

## 2019-04-15 DIAGNOSIS — M109 Gout, unspecified: Secondary | ICD-10-CM | POA: Diagnosis not present

## 2019-04-15 DIAGNOSIS — E782 Mixed hyperlipidemia: Secondary | ICD-10-CM | POA: Diagnosis not present

## 2019-04-15 DIAGNOSIS — M797 Fibromyalgia: Secondary | ICD-10-CM

## 2019-04-15 DIAGNOSIS — G4733 Obstructive sleep apnea (adult) (pediatric): Secondary | ICD-10-CM

## 2019-04-15 DIAGNOSIS — G609 Hereditary and idiopathic neuropathy, unspecified: Secondary | ICD-10-CM

## 2019-04-15 DIAGNOSIS — K3184 Gastroparesis: Secondary | ICD-10-CM

## 2019-04-15 DIAGNOSIS — R7303 Prediabetes: Secondary | ICD-10-CM

## 2019-04-16 DIAGNOSIS — G4733 Obstructive sleep apnea (adult) (pediatric): Secondary | ICD-10-CM | POA: Diagnosis not present

## 2019-04-16 NOTE — Progress Notes (Signed)
Pt is coming in Nov the 30th

## 2019-04-17 DIAGNOSIS — I10 Essential (primary) hypertension: Secondary | ICD-10-CM | POA: Diagnosis not present

## 2019-04-17 DIAGNOSIS — F419 Anxiety disorder, unspecified: Secondary | ICD-10-CM | POA: Diagnosis not present

## 2019-04-17 DIAGNOSIS — H04129 Dry eye syndrome of unspecified lacrimal gland: Secondary | ICD-10-CM | POA: Diagnosis not present

## 2019-04-17 DIAGNOSIS — G629 Polyneuropathy, unspecified: Secondary | ICD-10-CM | POA: Diagnosis not present

## 2019-04-17 DIAGNOSIS — J309 Allergic rhinitis, unspecified: Secondary | ICD-10-CM | POA: Diagnosis not present

## 2019-04-17 DIAGNOSIS — G4700 Insomnia, unspecified: Secondary | ICD-10-CM | POA: Diagnosis not present

## 2019-04-17 DIAGNOSIS — R69 Illness, unspecified: Secondary | ICD-10-CM | POA: Diagnosis not present

## 2019-04-17 DIAGNOSIS — E785 Hyperlipidemia, unspecified: Secondary | ICD-10-CM | POA: Diagnosis not present

## 2019-04-17 DIAGNOSIS — G8929 Other chronic pain: Secondary | ICD-10-CM | POA: Diagnosis not present

## 2019-04-29 NOTE — Progress Notes (Deleted)
Office Visit Note  Patient: [name]             Date of Birth: [birth year]           MRN: [id number]             PCP: Rita Ohara, MD Referring: Rita Ohara, MD Visit Date: 05/13/2019 Occupation: @GUAROCC @  Subjective:  No chief complaint on file.   History of Present Illness: [name] is a 63 y.o. female ***   Activities of Daily Living:  Patient reports morning stiffness for *** {minute/hour:19697}.   Patient {ACTIONS;DENIES/REPORTS:21021675::"Denies"} nocturnal pain.  Difficulty dressing/grooming: {ACTIONS;DENIES/REPORTS:21021675::"Denies"} Difficulty climbing stairs: {ACTIONS;DENIES/REPORTS:21021675::"Denies"} Difficulty getting out of chair: {ACTIONS;DENIES/REPORTS:21021675::"Denies"} Difficulty using hands for taps, buttons, cutlery, and/or writing: {ACTIONS;DENIES/REPORTS:21021675::"Denies"}  No Rheumatology ROS completed.   PMFS History:  Patient Active Problem List   Diagnosis Date Noted  . Dyspnea 12/26/2017  . Restrictive lung disease secondary to obesity 12/26/2017  . Atypical chest pain 12/06/2017  . Sinus tachycardia 12/06/2017  . Chest pain 12/06/2017  . DJD (degenerative joint disease), cervical 12/24/2016  . Primary osteoarthritis of both hips 12/24/2016  . Primary osteoarthritis of both knees 12/24/2016  . H/O total knee replacement, right 12/24/2016  . Spondylosis of lumbar region without myelopathy or radiculopathy 12/24/2016  . Acute gout 05/30/2016  . Myalgia 03/29/2016  . Other long term (current) drug therapy 03/29/2016  . Idiopathic peripheral neuropathy 03/29/2016  . Cannot sleep 03/29/2016  . Migraine without aura and responsive to treatment 03/29/2016  . Multifocal myoclonus 03/29/2016  . Restless leg 03/29/2016  . Has a tremor 03/29/2016  . History of aspiration pneumonitis 01/25/2016  . History of acute bronchitis 01/25/2016  . LPRD (laryngopharyngeal reflux disease) 01/25/2016  . Imbalance 01/09/2016  . Serotonin syndrome  12/22/2015  . OA (osteoarthritis) of knee 09/05/2015  . Obesity 05/17/2015  . OSA (obstructive sleep apnea) 03/16/2013  . Iron deficiency 11/06/2012  . Thrombocythemia ([place]) 11/06/2012  . Leukocytosis 11/05/2012  . Impaired fasting glucose 07/23/2012  . Bronchitis 04/03/2012  . Kidney stone on left side 03/05/2012  . Kidney cysts 03/04/2012  . Loose stools 03/04/2012  . Urinary frequency 12/04/2011  . Restless leg syndrome 12/04/2011  . Polypharmacy 12/04/2011  . Bladder incontinence 12/04/2011  . Fibromyalgia   . Hyperlipidemia   . Edema   . S/P endometrial ablation   . Allergic rhinitis 09/18/2011  . Depression, major, single episode, in partial remission ([place]) 04/21/2011  . PRECORDIAL PAIN 02/17/2010  . Anxiety state 01/30/2010  . Migraine 01/30/2010  . GERD 01/30/2010  . Gastroparesis 01/30/2010    Past Medical History:  Diagnosis Date  . Anemia    previously followed by Dr. Jamse Arn for anemia and elevated platelets  . Anxiety   . C. difficile colitis 10/01/12   treated by [facility]  . Chronic fatigue syndrome   . DDD (degenerative disc disease), lumbar 08/19/14   and facet arthroplasty & left lumbar radiculopathy (Dr.Ramos)  . Depression   . Dyssynergia    dyssynergenic defecation, contributing to fecal incontinence.  . Edema   . Fibromyalgia   . Gastroparesis    followed at [facility]  . GERD (gastroesophageal reflux disease)   . Hyperlipidemia   . Kidney stone   . Lumbar radiculopathy   . Migraine   . Neuropathy   . Obstructive sleep apnea    Does  wear  CPAP  . Paresthesia    Dr. Everette Rank at [facility]  . Pelvic floor dysfunction  pelvic floor dyssynergy  . Plantar fasciitis 02/2011   R foot  . Pneumonia    2012  . PONV (postoperative nausea and vomiting)    pt states has gastroparesis has difficulty taking antibiotics and narcotics has severe nausea and vomiting   . Restless leg syndrome   . S/P endometrial ablation 08/09/2006   Novasure  Ablation  . S/P epidural steroid injection 09/20/14   Dr.Ramos  . Tremor    Dr. Everette Rank  . Urinary frequency   . Urinary incontinence     Family History  Problem Relation Age of Onset  . Allergies Mother   . Hypertension Mother   . Heart disease Mother        possible valve problem - leaking valve  . Macular degeneration Mother   . Heart disease Father        pacemaker, CHF  . Hypertension Father   . Diabetes Father        borderline  . Stroke Father 46  . Kidney disease Father   . Asthma Sister   . Irritable bowel syndrome Sister   . Allergies Sister   . Heart disease Paternal Grandmother   . Heart disease Paternal Grandfather   . Cancer Maternal Aunt        leukemia  . Cancer Maternal Aunt   . Colon cancer Maternal Aunt        late 86's  . CAD Neg Hx    Past Surgical History:  Procedure Laterality Date  . CHOLECYSTECTOMY  9/05  . ENDOMETRIAL ABLATION  08/09/2006   Dr. Valentina Shaggy Ablation  . FACET JOINT INJECTION  04/17/2017   Left L4-5 and L5-S1  . KNEE ARTHROPLASTY    . KNEE SURGERY  1999   R knee, Dr. Eddie Dibbles, torn cartilage  . RETINAL LASER PROCEDURE Right 08/28/2018   laser retinopexy  . RIGHT/LEFT HEART CATH AND CORONARY ANGIOGRAPHY N/A 01/01/2018   Procedure: RIGHT/LEFT HEART CATH AND CORONARY ANGIOGRAPHY;  Surgeon: Sherren Mocha, MD;  Location: [place] [facility];  Service: Cardiovascular;  Laterality: N/A;  . TONSILLECTOMY  1968  . TONSILLECTOMY    . TOTAL KNEE ARTHROPLASTY Right 09/05/2015   Procedure: RIGHT TOTAL KNEE ARTHROPLASTY;  Surgeon: Gaynelle Arabian, MD;  Location[facility];  Service: Orthopedics;  Laterality: Right;  . TOTAL KNEE ARTHROPLASTY Left 07/14/2018   Procedure: LEFT TOTAL KNEE ARTHROPLASTY;  Surgeon: Gaynelle Arabian, MD;  Location: [facility];  Service: Orthopedics;  Laterality: Left;  . ULTRASOUND GUIDANCE FOR VASCULAR ACCESS  01/01/2018   Procedure: Ultrasound Guidance For Vascular Access;  Surgeon: Sherren Mocha, MD;  Location: [place] [facility];  Service: Cardiovascular;;   Social History   Social History Narrative   Married, 1 dog. 1 son in [place] (grandson born 06/2017), 1 stepson in [employer], with 2 children   Immunization History  Administered Date(s) Administered  . Influenza Split 09/17/2011, 09/03/2012, 09/04/2013  . Influenza,inj,Quad PF,6+ Mos 08/16/2014, 07/27/2015, 07/31/2017, 08/13/2018  . Influenza-Unspecified 10/04/2016, 07/31/2017, 09/23/2017  . Tdap 01/16/2011, 06/20/2017  . Zoster Recombinat (Shingrix) 02/05/2019     Objective: Vital Signs: LMP 07/27/2006    Physical Exam   Musculoskeletal Exam: ***  CDAI Exam: CDAI Score: Not documented Patient Global Assessment: Not documented; Provider Global Assessment: Not documented Swollen: Not documented; Tender: Not documented Joint Exam   Not documented   There is currently no information documented on the homunculus. Go to the Rheumatology activity and complete the homunculus joint exam.  Investigation: No additional findings.  Imaging: No  results found.  Recent Labs: Lab Results  Component Value Date   WBC 8.2 04/13/2019   HGB 12.2 04/13/2019   PLT 296 04/13/2019   NA 145 (H) 04/13/2019   K 4.3 04/13/2019   CL 109 (H) 04/13/2019   CO2 21 04/13/2019   GLUCOSE 106 (H) 04/13/2019   BUN 20 04/13/2019   CREATININE 0.85 04/13/2019   BILITOT 0.3 04/13/2019   ALKPHOS 121 (H) 04/13/2019   AST 21 04/13/2019   ALT 21 04/13/2019   PROT 6.4 04/13/2019   ALBUMIN 3.8 04/13/2019   CALCIUM 9.1 04/13/2019   GFRAA 85 04/13/2019    Speciality Comments: No specialty comments available.  Procedures:  No procedures performed Allergies: Erythromycin; Tramadol; and Dilaudid [hydromorphone hcl]   Assessment / Plan:     Visit Diagnoses: No diagnosis found.   Orders: No orders of the defined types were placed in this encounter.  No orders of the defined types were placed in this encounter.   Face-to-face time spent with patient was  *** minutes. Greater than 50% of time was spent in counseling and coordination of care.  Follow-Up Instructions: No follow-ups on file.   Earnestine Mealing, CMA  Note - This record has been created using Editor, commissioning.  Chart creation errors have been sought, but may not always  have been located. Such creation errors do not reflect on  the standard of medical care.

## 2019-05-04 ENCOUNTER — Other Ambulatory Visit: Payer: Medicare HMO

## 2019-05-04 ENCOUNTER — Telehealth: Payer: Self-pay | Admitting: Family Medicine

## 2019-05-04 DIAGNOSIS — R6889 Other general symptoms and signs: Secondary | ICD-10-CM | POA: Diagnosis not present

## 2019-05-04 DIAGNOSIS — Z20822 Contact with and (suspected) exposure to covid-19: Secondary | ICD-10-CM

## 2019-05-04 NOTE — Telephone Encounter (Signed)
Called pt to schedule for the covid-19 test.  She is scheduled for today Monday at [facility] site. She was advised to wear a mask, stay in the car with windows rolled up and ready for testing. And this will be a drive thru test site. Pt voiced understanding.

## 2019-05-04 NOTE — Telephone Encounter (Signed)
[name] called back and states her nephew is not planning on getting tested.  It has been 8 days since his exposure, no symptoms.  His wife was also around exposure no sx.  What should [name] do?  And [name] also has 62 year old mom that she is concerned about being around.  Please advise.

## 2019-05-04 NOTE — Telephone Encounter (Signed)
Called pt and advised of same.  Sent COVID test over to [facility] for pt.

## 2019-05-04 NOTE — Telephone Encounter (Signed)
Is the nephew going to be tested? Those with known contacts should be tested (ie her nephew).  She should quarantine herself until she knows his result.  If negative, then no further quarantine needed.  If positive, we can set up to get her tested

## 2019-05-04 NOTE — Telephone Encounter (Signed)
Does this meet criteria for her to be tested? If so, please refer (send Ramos email). Thanks.  In the meantime, isolate and stay away from her mother

## 2019-05-04 NOTE — Telephone Encounter (Signed)
-----   Message from Patience Musca sent at 05/04/2019  1:50 PM EDT ----- Marykay Lex,  Please test Ardath Sax per Dr Rita Ohara.  Thanks, Beverlee Nims

## 2019-05-04 NOTE — Telephone Encounter (Signed)
Pt called and states that her nephew come to see her yesterday and called her back and states he called her back and states that he ended up being around someone that tested positive for the COVID -19 she wants to know if she needs to quarantine her self, states she has no symptoms and her husband does not either, please advise, pt can be reached at [phone]

## 2019-05-04 NOTE — Telephone Encounter (Signed)
Pt called back and message was relayed. She states she does not know if family member will be tested. Since family member is not a pt here, I provided her the [facility] number for testing to pass along to him.

## 2019-05-05 ENCOUNTER — Telehealth: Payer: Self-pay | Admitting: Family Medicine

## 2019-05-05 LAB — NOVEL CORONAVIRUS, NAA: SARS-CoV-2, NAA: NOT DETECTED

## 2019-05-05 NOTE — Telephone Encounter (Signed)
Please call pt  [name] called about Covid results, per Dr. Redmond School I did Inform her that they were negative  [name] has a doctor appointment tomorrow morning that [name] wants to make sure that [name] is ok to go to And wants to make sure that [name] does not need to self quarantine anymore

## 2019-05-05 NOTE — Telephone Encounter (Signed)
With negative test, and as long as she still has no symptoms, she does not need to self quarantine anymore.  Should be fine to go to her doctor visit.

## 2019-05-05 NOTE — Telephone Encounter (Signed)
Pt informed of negative Covid results and that as long as she does not have symptoms she does not need to self quarantine

## 2019-05-06 DIAGNOSIS — M5416 Radiculopathy, lumbar region: Secondary | ICD-10-CM | POA: Diagnosis not present

## 2019-05-07 ENCOUNTER — Encounter: Payer: Medicare HMO | Admitting: Family Medicine

## 2019-05-11 ENCOUNTER — Other Ambulatory Visit: Payer: Self-pay | Admitting: Family Medicine

## 2019-05-11 DIAGNOSIS — Z1231 Encounter for screening mammogram for malignant neoplasm of breast: Secondary | ICD-10-CM

## 2019-05-12 ENCOUNTER — Other Ambulatory Visit: Payer: Self-pay | Admitting: Rheumatology

## 2019-05-12 NOTE — Telephone Encounter (Signed)
Last Visit: 11/11/2018 Next Visit: 06/04/2019  Okay to refill per Dr. Estanislado Pandy.

## 2019-05-13 ENCOUNTER — Ambulatory Visit: Payer: Self-pay | Admitting: Rheumatology

## 2019-05-17 DIAGNOSIS — G4733 Obstructive sleep apnea (adult) (pediatric): Secondary | ICD-10-CM | POA: Diagnosis not present

## 2019-05-21 NOTE — Progress Notes (Deleted)
Office Visit Note  Patient: [name]             Date of Birth: October 05, 1956           MRN: [id number]             PCP: Rita Ohara, MD Referring: Rita Ohara, MD Visit Date: 06/04/2019 Occupation: @GUAROCC @  Subjective:  No chief complaint on file.   History of Present Illness: [name] is a 63 y.o. female ***   Activities of Daily Living:  Patient reports morning stiffness for *** {minute/hour:19697}.   Patient {ACTIONS;DENIES/REPORTS:21021675::"Denies"} nocturnal pain.  Difficulty dressing/grooming: {ACTIONS;DENIES/REPORTS:21021675::"Denies"} Difficulty climbing stairs: {ACTIONS;DENIES/REPORTS:21021675::"Denies"} Difficulty getting out of chair: {ACTIONS;DENIES/REPORTS:21021675::"Denies"} Difficulty using hands for taps, buttons, cutlery, and/or writing: {ACTIONS;DENIES/REPORTS:21021675::"Denies"}  No Rheumatology ROS completed.   PMFS History:  Patient Active Problem List   Diagnosis Date Noted  . Dyspnea 12/26/2017  . Restrictive lung disease secondary to obesity 12/26/2017  . Atypical chest pain 12/06/2017  . Sinus tachycardia 12/06/2017  . Chest pain 12/06/2017  . DJD (degenerative joint disease), cervical 12/24/2016  . Primary osteoarthritis of both hips 12/24/2016  . Primary osteoarthritis of both knees 12/24/2016  . H/O total knee replacement, right 12/24/2016  . Spondylosis of lumbar region without myelopathy or radiculopathy 12/24/2016  . Acute gout 05/30/2016  . Myalgia 03/29/2016  . Other long term (current) drug therapy 03/29/2016  . Idiopathic peripheral neuropathy 03/29/2016  . Cannot sleep 03/29/2016  . Migraine without aura and responsive to treatment 03/29/2016  . Multifocal myoclonus 03/29/2016  . Restless leg 03/29/2016  . Has a tremor 03/29/2016  . History of aspiration pneumonitis 01/25/2016  . History of acute bronchitis 01/25/2016  . LPRD (laryngopharyngeal reflux disease) 01/25/2016  . Imbalance 01/09/2016  . Serotonin syndrome  12/22/2015  . OA (osteoarthritis) of knee 09/05/2015  . Obesity 05/17/2015  . OSA (obstructive sleep apnea) 03/16/2013  . Iron deficiency 11/06/2012  . Thrombocythemia ([place]) 11/06/2012  . Leukocytosis 11/05/2012  . Impaired fasting glucose 07/23/2012  . Bronchitis 04/03/2012  . Kidney stone on left side 03/05/2012  . Kidney cysts 03/04/2012  . Loose stools 03/04/2012  . Urinary frequency 12/04/2011  . Restless leg syndrome 12/04/2011  . Polypharmacy 12/04/2011  . Bladder incontinence 12/04/2011  . Fibromyalgia   . Hyperlipidemia   . Edema   . S/P endometrial ablation   . Allergic rhinitis 09/18/2011  . Depression, major, single episode, in partial remission ([place]) 04/21/2011  . PRECORDIAL PAIN 02/17/2010  . Anxiety state 01/30/2010  . Migraine 01/30/2010  . GERD 01/30/2010  . Gastroparesis 01/30/2010    Past Medical History:  Diagnosis Date  . Anemia    previously followed by Dr. Jamse Arn for anemia and elevated platelets  . Anxiety   . C. difficile colitis 10/01/12   treated by [facility]  . Chronic fatigue syndrome   . DDD (degenerative disc disease), lumbar 08/19/14   and facet arthroplasty & left lumbar radiculopathy (Dr.Ramos)  . Depression   . Dyssynergia    dyssynergenic defecation, contributing to fecal incontinence.  . Edema   . Fibromyalgia   . Gastroparesis    followed at [facility]  . GERD (gastroesophageal reflux disease)   . Hyperlipidemia   . Kidney stone   . Lumbar radiculopathy   . Migraine   . Neuropathy   . Obstructive sleep apnea    Does  wear  CPAP  . Paresthesia    Dr. Everette Rank at [facility]  . Pelvic floor dysfunction  pelvic floor dyssynergy  . Plantar fasciitis 02/2011   R foot  . Pneumonia    2012  . PONV (postoperative nausea and vomiting)    pt states has gastroparesis has difficulty taking antibiotics and narcotics has severe nausea and vomiting   . Restless leg syndrome   . S/P endometrial ablation 08/09/2006   Novasure  Ablation  . S/P epidural steroid injection 09/20/14   Dr.Ramos  . Tremor    Dr. Everette Rank  . Urinary frequency   . Urinary incontinence     Family History  Problem Relation Age of Onset  . Allergies Mother   . Hypertension Mother   . Heart disease Mother        possible valve problem - leaking valve  . Macular degeneration Mother   . Heart disease Father        pacemaker, CHF  . Hypertension Father   . Diabetes Father        borderline  . Stroke Father 23  . Kidney disease Father   . Asthma Sister   . Irritable bowel syndrome Sister   . Allergies Sister   . Heart disease Paternal Grandmother   . Heart disease Paternal Grandfather   . Cancer Maternal Aunt        leukemia  . Cancer Maternal Aunt   . Colon cancer Maternal Aunt        late 50's  . CAD Neg Hx    Past Surgical History:  Procedure Laterality Date  . CHOLECYSTECTOMY  9/05  . ENDOMETRIAL ABLATION  08/09/2006   Dr. Valentina Shaggy Ablation  . FACET JOINT INJECTION  04/17/2017   Left L4-5 and L5-S1  . KNEE ARTHROPLASTY    . KNEE SURGERY  1999   R knee, Dr. Eddie Dibbles, torn cartilage  . RETINAL LASER PROCEDURE Right 08/28/2018   laser retinopexy  . RIGHT/LEFT HEART CATH AND CORONARY ANGIOGRAPHY N/A 01/01/2018   Procedure: RIGHT/LEFT HEART CATH AND CORONARY ANGIOGRAPHY;  Surgeon: Sherren Mocha, MD;  Location: [place] [facility];  Service: Cardiovascular;  Laterality: N/A;  . TONSILLECTOMY  1968  . TONSILLECTOMY    . TOTAL KNEE ARTHROPLASTY Right 09/05/2015   Procedure: RIGHT TOTAL KNEE ARTHROPLASTY;  Surgeon: Gaynelle Arabian, MD;  Location[facility];  Service: Orthopedics;  Laterality: Right;  . TOTAL KNEE ARTHROPLASTY Left 07/14/2018   Procedure: LEFT TOTAL KNEE ARTHROPLASTY;  Surgeon: Gaynelle Arabian, MD;  Location: [facility];  Service: Orthopedics;  Laterality: Left;  . ULTRASOUND GUIDANCE FOR VASCULAR ACCESS  01/01/2018   Procedure: Ultrasound Guidance For Vascular Access;  Surgeon: Sherren Mocha, MD;  Location: [place] [facility];  Service: Cardiovascular;;   Social History   Social History Narrative   Married, 1 dog. 1 son in [place] (grandson born 06/2017), 1 stepson in [place], with 2 children   Immunization History  Administered Date(s) Administered  . Influenza Split 09/17/2011, 09/03/2012, 09/04/2013  . Influenza,inj,Quad PF,6+ Mos 08/16/2014, 07/27/2015, 07/31/2017, 08/13/2018  . Influenza-Unspecified 10/04/2016, 07/31/2017, 09/23/2017  . Tdap 01/16/2011, 06/20/2017  . Zoster Recombinat (Shingrix) 02/05/2019     Objective: Vital Signs: LMP 07/27/2006    Physical Exam   Musculoskeletal Exam: ***  CDAI Exam: CDAI Score: - Patient Global: -; Provider Global: - Swollen: -; Tender: - Joint Exam   No joint exam has been documented for this visit   There is currently no information documented on the homunculus. Go to the Rheumatology activity and complete the homunculus joint exam.  Investigation: No additional findings.  Imaging: No  results found.  Recent Labs: Lab Results  Component Value Date   WBC 8.2 04/13/2019   HGB 12.2 04/13/2019   PLT 296 04/13/2019   NA 145 (H) 04/13/2019   K 4.3 04/13/2019   CL 109 (H) 04/13/2019   CO2 21 04/13/2019   GLUCOSE 106 (H) 04/13/2019   BUN 20 04/13/2019   CREATININE 0.85 04/13/2019   BILITOT 0.3 04/13/2019   ALKPHOS 121 (H) 04/13/2019   AST 21 04/13/2019   ALT 21 04/13/2019   PROT 6.4 04/13/2019   ALBUMIN 3.8 04/13/2019   CALCIUM 9.1 04/13/2019   GFRAA 85 04/13/2019    Speciality Comments: No specialty comments available.  Procedures:  No procedures performed Allergies: Erythromycin, Tramadol, and Dilaudid [hydromorphone hcl]   Assessment / Plan:     Visit Diagnoses: No diagnosis found.   Orders: No orders of the defined types were placed in this encounter.  No orders of the defined types were placed in this encounter.   Face-to-face time spent with patient was *** minutes. Greater than 50% of time was spent in  counseling and coordination of care.  Follow-Up Instructions: No follow-ups on file.   Earnestine Mealing, CMA  Note - This record has been created using Editor, commissioning.  Chart creation errors have been sought, but may not always  have been located. Such creation errors do not reflect on  the standard of medical care.

## 2019-05-25 ENCOUNTER — Telehealth: Payer: Self-pay | Admitting: *Deleted

## 2019-05-25 ENCOUNTER — Other Ambulatory Visit: Payer: Self-pay

## 2019-05-25 ENCOUNTER — Encounter: Payer: Self-pay | Admitting: Family Medicine

## 2019-05-25 ENCOUNTER — Ambulatory Visit (INDEPENDENT_AMBULATORY_CARE_PROVIDER_SITE_OTHER): Payer: Medicare HMO | Admitting: Family Medicine

## 2019-05-25 VITALS — BP 124/75 | HR 68 | Ht 62.5 in | Wt 228.0 lb

## 2019-05-25 DIAGNOSIS — R197 Diarrhea, unspecified: Secondary | ICD-10-CM

## 2019-05-25 DIAGNOSIS — R05 Cough: Secondary | ICD-10-CM | POA: Diagnosis not present

## 2019-05-25 DIAGNOSIS — B349 Viral infection, unspecified: Secondary | ICD-10-CM

## 2019-05-25 DIAGNOSIS — Z20822 Contact with and (suspected) exposure to covid-19: Secondary | ICD-10-CM

## 2019-05-25 DIAGNOSIS — R059 Cough, unspecified: Secondary | ICD-10-CM

## 2019-05-25 NOTE — Telephone Encounter (Signed)
Pt scheduled for covid testing today @ [facility] @ 3:45pm. Instructions given and order placed. Text sent to patient to activate mychart.

## 2019-05-25 NOTE — Progress Notes (Signed)
Start time: 1:37 End time: 1:52  Virtual Visit via Video Note  I connected with [name] on 05/25/19 at  1:30 PM EDT by a video enabled telemedicine application and verified that I am speaking with the correct person using two identifiers.  Location: Patient: home, husband and dog present in the room Provider: office   I discussed the limitations of evaluation and management by telemedicine and the availability of in person appointments. The patient expressed understanding and agreed to proceed. She consents to insurance being filed for her visit.  History of Present Illness:  Chief Complaint  Patient presents with  . Cough    some SOB, ST, HA, runny nose,diarrhea and stuffiness. No fever. Mucus is clear. Symptoms started Saturday night. She feels achy and "yuck." Feels the worst today.    Symptoms started 2 nights ago--woke up with runny nose, sore throat.  She was fine when she went to bed.  She woke up yesterday with a lot of nasal congestion. She had some diarrhea yesterday after eating, no matter what she ate.  Slight nausea, early yesterday, relieved by Compazine.  No nausea today.  Bowels remain loose today. Slight cough, occasionally feels a little short of breath, mostly just after a "bad coughing spell", most of the time denies any SOB.  Nasal drainage is clear.  Cough is dry, nonproductive. Slight chest congestion/tightness just noted a few times today.  Had been running errands, not outside much. Sees her mother every 1-2 days.  Grandson had a runny nose when she watched him on Thursday (he has allergies, and often has runny nose).  His mucus was green. Daughter-in-law (his  Mom) has a bit of a cough now.  She has been taking plain Mucinex since yesterday morning. She took Tylenol, which helped with her headache.  Headache is at both temples.  Denies sinus pain. She continues to take zyrtec and flonase. Hasn't needed albuterol. Took one dose of compazine  yesterday.  PMH, PSH, SH reviewed  Outpatient Encounter Medications as of 05/25/2019  Medication Sig Note  . acetaminophen (TYLENOL) 500 MG tablet Take 1,000 mg by mouth 3 (three) times daily as needed for moderate pain.   Marland Kitchen allopurinol (ZYLOPRIM) 300 MG tablet TAKE 1 TABLET (300 MG TOTAL) DAILY BY MOUTH.   Marland Kitchen ALPRAZolam (XANAX) 0.5 MG tablet Take 0.5 mg by mouth 3 (three) times daily as needed for anxiety. 04/15/2019: Uses it most days, once a day  . ARIPiprazole (ABILIFY) 5 MG tablet Take 5 mg by mouth daily.   . Armodafinil 250 MG tablet TAKE 1 TABLET EVERY DAY WITH BREAKFAST   . atorvastatin (LIPITOR) 40 MG tablet TAKE 1 TABLET BY MOUTH EVERY DAY   . cetirizine (ZYRTEC) 10 MG tablet Take 10 mg by mouth at bedtime.   . cholecalciferol (VITAMIN D) 1000 units tablet Take 1,000 Units by mouth daily.   . DULoxetine (CYMBALTA) 60 MG capsule Take 60 mg by mouth 2 (two) times daily.    . famotidine (PEPCID) 40 MG tablet Take 40 mg by mouth 2 (two) times daily. 04/15/2019: Will start when she runs out of ranitidine  . fluticasone (FLONASE) 50 MCG/ACT nasal spray PLACE 2 SPRAYS INTO BOTH NOSTRILS DAILY AS NEEDED FOR ALLERGIES   . gabapentin (NEURONTIN) 300 MG capsule Take 1 capsule (300 mg total) by mouth 3 (three) times daily. Gabapentin 300 mg Protocol Take a 300 mg capsule three times a day for two weeks following surgery. Then take a 300 mg capsule  two times a day for two weeks.  Then resume a 300 mg capsule once a day at bedtime. 10/13/2018: Taking 300mg  TID  . guaiFENesin (MUCINEX) 600 MG 12 hr tablet Take 600 mg by mouth 2 (two) times daily.   . Melatonin 10 MG TABS Take 10 mg by mouth at bedtime.   . methocarbamol (ROBAXIN) 500 MG tablet TAKE 1 TABLET (500 MG TOTAL) BY MOUTH 2 (TWO) TIMES DAILY AS NEEDED FOR MUSCLE SPASMS.   . metoprolol tartrate (LOPRESSOR) 50 MG tablet Take 1 tablet (50 mg total) by mouth 2 (two) times daily.   . Multiple Vitamins-Minerals (ICAPS AREDS 2) CAPS Take 1 capsule  by mouth 2 (two) times daily.   . nortriptyline (PAMELOR) 25 MG capsule Take 75 mg by mouth at bedtime.    . Omega-3 Fatty Acids (FISH OIL) 1200 MG CAPS Take 1,200 mg by mouth daily.   . pantoprazole (PROTONIX) 40 MG tablet Take 40 mg by mouth 2 (two) times daily.   . Probiotic Product ([place]) CAPS Take 1 capsule by mouth at bedtime.    . prochlorperazine (COMPAZINE) 10 MG tablet Take 10 mg by mouth every 6 (six) hours as needed for nausea or vomiting.    Marland Kitchen Propylene Glycol (SYSTANE COMPLETE) 0.6 % SOLN Place 1 drop into both eyes 2 (two) times daily.   Marland Kitchen topiramate (TOPAMAX) 25 MG tablet TAKE 2 TABLETS (50 MG TOTAL) BY MOUTH AT BEDTIME.9   . vitamin B-12 (CYANOCOBALAMIN) 1000 MCG tablet Take 1,000 mcg by mouth daily.   Marland Kitchen XIIDRA 5 % SOLN Apply 1 drop to eye 2 (two) times daily.   Marland Kitchen albuterol (PROVENTIL HFA;VENTOLIN HFA) 108 (90 Base) MCG/ACT inhaler Inhale 1-2 puffs into the lungs every 6 (six) hours as needed for wheezing or shortness of breath. (Patient not taking: Reported on 04/15/2019)   . aspirin 81 MG tablet aspirin 81 mg tablet,delayed release  TAKE 1 TABLET BY MOUTH EVERY DAY   . colchicine 0.6 MG tablet Take 2 tablets by mouth at onset of gout flare. Taken an additional tablet 1 hour later, if needed. May take like this every 3d, if needed (Patient not taking: Reported on 04/15/2019)   . HYDROcodone-acetaminophen (NORCO/VICODIN) 5-325 MG tablet Take 1 tablet by mouth 2 (two) times daily as needed.   . ondansetron (ZOFRAN) 8 MG tablet Take 8 mg by mouth every 8 (eight) hours as needed for nausea.    . [DISCONTINUED] carbamazepine (TEGRETOL XR) 100 MG 12 hr tablet Take 200 mg by mouth daily.     . [DISCONTINUED] Eszopiclone 3 MG TABS Take 3 mg by mouth at bedtime. Take immediately before bedtime   . [DISCONTINUED] ranitidine (ZANTAC) 300 MG tablet Take 300 mg by mouth 2 (two) times daily.  04/15/2019: Changed to famotidine 40mg  BID, hasn't yet started it   No  facility-administered encounter medications on file as of 05/25/2019.    ROS:  Headache, URI symptoms, nausea and diarrhea per HPI.  No loss of smell, no vomiting, no bleeding, bruising, rashes.  See HPI.     Observations/Objective:  BP 124/75   Pulse 68   Ht 5' 2.5" (1.588 m)   Wt 228 lb (103.4 kg)   LMP 07/27/2006   BMI 41.04 kg/m   Speaking comfortably, sounds nasal. No coughing during visit. She is alert, oriented, and in no distress She appears well.  Assessment and Plan:  Viral syndrome - suspect URI from exposure to her sick grandson (and daugher-in-law).  Given  her regular visits to her elderly mother, COVID testing recommended. - Plan: CANCELED: Novel Coronavirus, [facility] ([facility])  [place] testing [place]  Cough - Plan: CANCELED: Novel Coronavirus, [facility] ([facility])  [place] testing site [place]  Diarrhea, unspecified type - suspect related to virus.  BRAT diet, bland food, avoid dairy.   Refer for COVID testing.  Bland diet, BRAT, avoiding dairy.  Continue current OTC meds Consider sinus rinses prn sinus pain.    Follow Up Instructions:    I discussed the assessment and treatment plan with the patient. The patient was provided an opportunity to ask questions and all were answered. The patient agreed with the plan and demonstrated an understanding of the instructions.   The patient was advised to call back or seek an in-person evaluation if the symptoms worsen or if the condition fails to improve as anticipated.  I provided 15 minutes of non-face-to-face time during this encounter.   Vikki Ports, MD

## 2019-05-25 NOTE — Patient Instructions (Addendum)
We discussed that your symptoms sound like a viral illness that you likely caught from your grandson.  I do not suspect a bacterial infection that needs antibiotics. I recommend coronavirus testing since you visit your mother frequently. We discussed avoiding exposure with anyone until you have your COVID test results back.  We discussed continuing your allergy medications, using sinus rinses as needed for sinus pain, and continuing your allergy medications.   We discussed avoiding dairy for at least 5 days, as well as eating a bland diet (avoid spicy, greasy foods), and eating BRAT diet (bananas, rice, applesauce and toast).  Contact us if your symptoms persist/worsen/change.

## 2019-05-25 NOTE — Progress Notes (Signed)
Done

## 2019-05-25 NOTE — Telephone Encounter (Signed)
-----   Message from Rita Ohara, MD sent at 05/25/2019  1:53 PM EDT ----- Regarding: COVID-19 testing Please contact pt with appointment for COVID testing.  She has 2 days of congestion, cough, headache, diarrhea, headaches

## 2019-05-28 LAB — NOVEL CORONAVIRUS, NAA: SARS-CoV-2, NAA: NOT DETECTED

## 2019-06-01 ENCOUNTER — Other Ambulatory Visit: Payer: Self-pay | Admitting: Rheumatology

## 2019-06-01 NOTE — Telephone Encounter (Signed)
Last Visit: 11/11/2018 Next Visit: 06/04/2019  Okay to refill per Dr. Estanislado Pandy

## 2019-06-03 ENCOUNTER — Other Ambulatory Visit: Payer: Self-pay | Admitting: Rheumatology

## 2019-06-03 NOTE — Telephone Encounter (Signed)
Patient needs a refill on Armodafinil 250mg  sent to [facility] on [address].

## 2019-06-04 ENCOUNTER — Other Ambulatory Visit: Payer: Self-pay | Admitting: Family Medicine

## 2019-06-04 ENCOUNTER — Ambulatory Visit: Payer: Medicare HMO | Admitting: Physician Assistant

## 2019-06-04 NOTE — Telephone Encounter (Signed)
Dr. Estanislado Pandy sent last refill on 03/09/19. Cannot refill until 06/08/19.

## 2019-06-04 NOTE — Telephone Encounter (Signed)
Last Visit:11/11/2018 Next Visit:07/08/2019  Okay to refillArmodafinil?

## 2019-06-05 ENCOUNTER — Telehealth: Payer: Medicare HMO | Admitting: Rheumatology

## 2019-06-08 MED ORDER — ARMODAFINIL 250 MG PO TABS: ORAL_TABLET | ORAL | 2 refills | Status: DC

## 2019-06-08 NOTE — Telephone Encounter (Signed)
?  ok to refill °

## 2019-06-12 ENCOUNTER — Telehealth: Payer: Self-pay | Admitting: Family Medicine

## 2019-06-12 NOTE — Telephone Encounter (Signed)
Pt has optum form to be completed. Pt had a [place] in May. Please complete and return to [facility]

## 2019-06-16 DIAGNOSIS — G4733 Obstructive sleep apnea (adult) (pediatric): Secondary | ICD-10-CM | POA: Diagnosis not present

## 2019-06-16 DIAGNOSIS — M5136 Other intervertebral disc degeneration, lumbar region: Secondary | ICD-10-CM | POA: Diagnosis not present

## 2019-06-24 NOTE — Progress Notes (Signed)
Office Visit Note  Patient: [name]             Date of Birth: 12-02-1955           MRN: [id number]             PCP: Rita Ohara, MD Referring: Rita Ohara, MD Visit Date: 07/08/2019 Occupation: @GUAROCC @  Subjective:  Trapezius muscle tension   History of Present Illness: [name] is a 63 y.o. female with history of fibromyalgia, osteoarthritis, and gout.  She takes Robaxin 500 mg twice daily as needed for muscle spasms and Topamax 25 mg 2 tablets by mouth at bedtime.  She continues to take gabapentin and Cymbalta as prescribed.  She continues to have generalized muscle aches and muscle tenderness due to fibromyalgia.  She has a trapezius muscle tension and muscle spasms intermittently.  She reports that she scrap books on a regular basis which causes increased muscle tension in her trapezius.  She would like trigger point injections today.  She continues to have chronic fatigue related to insomnia.  She states that she takes melatonin at bedtime and continues to use her CPAP.  She states she sleeps about 6 hours per night.  She reports that she saw Dr. Herma Mering 1/2 to 2 months ago for evaluation of lower back pain.  She was having radiating pain down the left lower extremity at that time.  She did injection performed which resolved her symptoms of sciatica.  She reports of bilateral knee replacements are doing well.  She has occasional discomfort in the left knee.  She denies any recent falls. She takes allopurinol 300 mg po daily and colchicine 0.6 mg 1 tablet by mouth dialy as needed for management of gout.    Activities of Daily Living:  Patient reports morning stiffness for 15 minutes.   Patient Reports nocturnal pain.  Difficulty dressing/grooming: Denies Difficulty climbing stairs: Reports Difficulty getting out of chair: Reports Difficulty using hands for taps, buttons, cutlery, and/or writing: Reports  Review of Systems  Constitutional: Positive for fatigue.  HENT:  Positive for mouth dryness. Negative for mouth sores and nose dryness.   Eyes: Positive for dryness. Negative for pain and visual disturbance.  Respiratory: Negative for cough, hemoptysis, shortness of breath and difficulty breathing.   Cardiovascular: Negative for chest pain, palpitations, hypertension and swelling in legs/feet.  Gastrointestinal: Negative for blood in stool, constipation and diarrhea.  Genitourinary: Negative for painful urination.  Musculoskeletal: Positive for morning stiffness and muscle tenderness. Negative for arthralgias, joint pain, joint swelling, myalgias, muscle weakness and myalgias.  Skin: Negative for color change, pallor, rash, hair loss, nodules/bumps, skin tightness, ulcers and sensitivity to sunlight.  Allergic/Immunologic: Negative for susceptible to infections.  Neurological: Negative for dizziness, numbness, headaches and weakness.  Hematological: Negative for bruising/bleeding tendency and swollen glands.  Psychiatric/Behavioral: Positive for sleep disturbance. Negative for depressed mood. The patient is not nervous/anxious.     PMFS History:  Patient Active Problem List   Diagnosis Date Noted  . Dyspnea 12/26/2017  . Restrictive lung disease secondary to obesity 12/26/2017  . Atypical chest pain 12/06/2017  . Sinus tachycardia 12/06/2017  . Chest pain 12/06/2017  . DJD (degenerative joint disease), cervical 12/24/2016  . Primary osteoarthritis of both hips 12/24/2016  . Primary osteoarthritis of both knees 12/24/2016  . H/O total knee replacement, right 12/24/2016  . Spondylosis of lumbar region without myelopathy or radiculopathy 12/24/2016  . Acute gout 05/30/2016  . Myalgia 03/29/2016  . Other  long term (current) drug therapy 03/29/2016  . Idiopathic peripheral neuropathy 03/29/2016  . Cannot sleep 03/29/2016  . Migraine without aura and responsive to treatment 03/29/2016  . Multifocal myoclonus 03/29/2016  . Restless leg 03/29/2016  .  Has a tremor 03/29/2016  . History of aspiration pneumonitis 01/25/2016  . History of acute bronchitis 01/25/2016  . LPRD (laryngopharyngeal reflux disease) 01/25/2016  . Imbalance 01/09/2016  . Serotonin syndrome 12/22/2015  . OA (osteoarthritis) of knee 09/05/2015  . Obesity 05/17/2015  . OSA (obstructive sleep apnea) 03/16/2013  . Iron deficiency 11/06/2012  . Thrombocythemia ([place]) 11/06/2012  . Leukocytosis 11/05/2012  . Impaired fasting glucose 07/23/2012  . Bronchitis 04/03/2012  . Kidney stone on left side 03/05/2012  . Kidney cysts 03/04/2012  . Loose stools 03/04/2012  . Urinary frequency 12/04/2011  . Restless leg syndrome 12/04/2011  . Polypharmacy 12/04/2011  . Bladder incontinence 12/04/2011  . Fibromyalgia   . Hyperlipidemia   . Edema   . S/P endometrial ablation   . Allergic rhinitis 09/18/2011  . Depression, major, single episode, in partial remission ([place]) 04/21/2011  . PRECORDIAL PAIN 02/17/2010  . Anxiety state 01/30/2010  . Migraine 01/30/2010  . GERD 01/30/2010  . Gastroparesis 01/30/2010    Past Medical History:  Diagnosis Date  . Anemia    previously followed by Dr. Jamse Arn for anemia and elevated platelets  . Anxiety   . C. difficile colitis 10/01/12   treated by [facility]  . Chronic fatigue syndrome   . DDD (degenerative disc disease), lumbar 08/19/14   and facet arthroplasty & left lumbar radiculopathy (Dr.Ramos)  . Depression   . Dyssynergia    dyssynergenic defecation, contributing to fecal incontinence.  . Edema   . Fibromyalgia   . Gastroparesis    followed at [facility]  . GERD (gastroesophageal reflux disease)   . Hyperlipidemia   . Kidney stone   . Lumbar radiculopathy   . Migraine   . Neuropathy   . Obstructive sleep apnea    Does  wear  CPAP  . Paresthesia    Dr. Everette Rank at [facility]  . Pelvic floor dysfunction    pelvic floor dyssynergy  . Plantar fasciitis 02/2011   R foot  . Pneumonia    2012  . PONV (postoperative  nausea and vomiting)    pt states has gastroparesis has difficulty taking antibiotics and narcotics has severe nausea and vomiting   . Restless leg syndrome   . S/P endometrial ablation 08/09/2006   Novasure Ablation  . S/P epidural steroid injection 09/20/14   Dr.Ramos  . Tremor    Dr. Everette Rank  . Urinary frequency   . Urinary incontinence     Family History  Problem Relation Age of Onset  . Allergies Mother   . Hypertension Mother   . Heart disease Mother        possible valve problem - leaking valve  . Macular degeneration Mother   . Heart disease Father        pacemaker, CHF  . Hypertension Father   . Diabetes Father        borderline  . Stroke Father 25  . Kidney disease Father   . Asthma Sister   . Irritable bowel syndrome Sister   . Allergies Sister   . Heart disease Paternal Grandmother   . Heart disease Paternal Grandfather   . Cancer Maternal Aunt        leukemia  . Cancer Maternal Aunt   . Colon cancer  Maternal Aunt        late 31's  . CAD Neg Hx    Past Surgical History:  Procedure Laterality Date  . CHOLECYSTECTOMY  9/05  . ENDOMETRIAL ABLATION  08/09/2006   Dr. Valentina Shaggy Ablation  . FACET JOINT INJECTION  04/17/2017   Left L4-5 and L5-S1  . KNEE ARTHROPLASTY    . KNEE SURGERY  1999   R knee, Dr. Eddie Dibbles, torn cartilage  . RETINAL LASER PROCEDURE Right 08/28/2018   laser retinopexy  . RIGHT/LEFT HEART CATH AND CORONARY ANGIOGRAPHY N/A 01/01/2018   Procedure: RIGHT/LEFT HEART CATH AND CORONARY ANGIOGRAPHY;  Surgeon: Sherren Mocha, MD;  Location: [facility];  Service: Cardiovascular;  Laterality: N/A;  . TONSILLECTOMY  1968  . TONSILLECTOMY    . TOTAL KNEE ARTHROPLASTY Right 09/05/2015   Procedure: RIGHT TOTAL KNEE ARTHROPLASTY;  Surgeon: Gaynelle Arabian, MD;  Location: [facility];  Service: Orthopedics;  Laterality: Right;  . TOTAL KNEE ARTHROPLASTY Left 07/14/2018   Procedure: LEFT TOTAL KNEE ARTHROPLASTY;  Surgeon: Gaynelle Arabian, MD;   Location: [facility];  Service: Orthopedics;  Laterality: Left;  . ULTRASOUND GUIDANCE FOR VASCULAR ACCESS  01/01/2018   Procedure: Ultrasound Guidance For Vascular Access;  Surgeon: Sherren Mocha, MD;  Location: [facility];  Service: Cardiovascular;;   Social History   Social History Narrative   Married, 1 dog. 1 son in [place] (grandson born 06/2017), 1 stepson in [place], with 2 children   Immunization History  Administered Date(s) Administered  . Influenza Split 09/17/2011, 09/03/2012, 09/04/2013  . Influenza,inj,Quad PF,6+ Mos 08/16/2014, 07/27/2015, 07/31/2017, 08/13/2018  . Influenza-Unspecified 10/04/2016, 07/31/2017, 09/23/2017  . Tdap 01/16/2011, 06/20/2017  . Zoster Recombinat (Shingrix) 02/05/2019     Objective: Vital Signs: BP 105/67 (BP Location: Left Arm, Patient Position: Sitting, Cuff Size: Normal)   Pulse 84   Resp 18   Ht 5' 2.5" (1.588 m)   Wt 230 lb 3.2 oz (104.4 kg)   LMP 07/27/2006   BMI 41.43 kg/m    Physical Exam Vitals signs and nursing note reviewed.  Constitutional:      Appearance: She is well-developed.  HENT:     Head: Normocephalic and atraumatic.  Eyes:     Conjunctiva/sclera: Conjunctivae normal.  Neck:     Musculoskeletal: Normal range of motion.  Cardiovascular:     Rate and Rhythm: Normal rate and regular rhythm.     Heart sounds: Normal heart sounds.  Pulmonary:     Effort: Pulmonary effort is normal.     Breath sounds: Normal breath sounds.  Abdominal:     General: Bowel sounds are normal.     Palpations: Abdomen is soft.  Lymphadenopathy:     Cervical: No cervical adenopathy.  Skin:    General: Skin is warm and dry.     Capillary Refill: Capillary refill takes less than 2 seconds.  Neurological:     Mental Status: She is alert and oriented to person, place, and time.  Psychiatric:        Behavior: Behavior normal.      Musculoskeletal Exam: She has generalized hyperalgesia and positive tender points on exam.  C-spine  limited range of motion with discomfort.  She is trapezius muscle tension and muscle tenderness bilaterally.  Postural thoracic kyphosis noted.  Midline spinal tenderness in lumbar region.  No SI joint tenderness.  Shoulder joints have good range of motion with some discomfort.  Elbow joints, wrist joints, MCPs, PIPs, DIPs good range of motion no synovitis.  CDAI Exam: CDAI Score: - Patient Global: -; Provider Global: - Swollen: -; Tender: - Joint Exam   No joint exam has been documented for this visit   There is currently no information documented on the homunculus. Go to the Rheumatology activity and complete the homunculus joint exam.  Investigation: No additional findings.  Imaging: No results found.  Recent Labs: Lab Results  Component Value Date   WBC 8.2 04/13/2019   HGB 12.2 04/13/2019   PLT 296 04/13/2019   NA 145 (H) 04/13/2019   K 4.3 04/13/2019   CL 109 (H) 04/13/2019   CO2 21 04/13/2019   GLUCOSE 106 (H) 04/13/2019   BUN 20 04/13/2019   CREATININE 0.85 04/13/2019   BILITOT 0.3 04/13/2019   ALKPHOS 121 (H) 04/13/2019   AST 21 04/13/2019   ALT 21 04/13/2019   PROT 6.4 04/13/2019   ALBUMIN 3.8 04/13/2019   CALCIUM 9.1 04/13/2019   GFRAA 85 04/13/2019    Speciality Comments: No specialty comments available.  Procedures:  Trigger Point Inj  Date/Time: 07/08/2019 2:25 PM Performed by: Bo Merino, MD Authorized by: Bo Merino, MD   Consent Given by:  Patient Site marked: the procedure site was marked   Timeout: prior to procedure the correct patient, procedure, and site was verified   Indications:  Pain Total # of Trigger Points:  2 Location: neck   Needle Size:  27 G Approach:  Dorsal Medications #1:  0.5 mL lidocaine 1 %; 10 mg triamcinolone acetonide 40 MG/ML Medications #2:  0.5 mL lidocaine 1 %; 10 mg triamcinolone acetonide 40 MG/ML Patient tolerance:  Patient tolerated the procedure well with no immediate complications    Allergies: Erythromycin, Tramadol, and Dilaudid [hydromorphone hcl]   Assessment / Plan:     Visit Diagnoses: Fibromyalgia - She has generalized hyperalgesia and positive tender points on exam.  She continues have generalized muscle aches and muscle tenderness due to fibromyalgia.  She has trapezius muscle tension and muscle spasms intermittently.  She scrap books on a regular basis and due to poor posture she experiences increased muscle tension afterwards.  She would like bilateral trigger point injections today.  She tolerated procedure well.  The procedure note was completed above.  She continues to take gabapentin and Cymbalta as prescribed.  She takes Robaxin 500 mg twice daily as needed for muscle spasms.  She continues to have chronic fatigue related to insomnia.  She takes armodafinil  250 mg 1 tablet by mouth daily, which helps with her fatigue.  She takes Topamax 25 mg 2 tablets by mouth at bedtime and melatonin.  She typically sleeps about 6 hours per night.  She uses a CPAP.  We discussed the importance of staying active and following good sleep hygiene habits.  She will follow-up in the office in 6 months.  Frequent falls -She has not had any recent falls  Primary osteoarthritis of both hips -She has slightly limited range of motion with discomfort.  She has no difficulty with ambulation.  Trapezius muscle spasm -She has trapezius muscle tension and muscle tenderness bilaterally.  She requested bilateral trigger point injections.  She tolerated the procedure well.  She continues to take Robaxin 500 mg 1 tablet twice daily as needed for muscle spasms.  A refill of Robaxin was sent to the pharmacy today.  DDD (degenerative disc disease), cervical -She has limited range of motion.  She has been experiencing trapezius muscle tension and muscle tenderness bilaterally.  She has no symptoms of radiculopathy.  DDD (degenerative disc disease), lumbar - She has intermittent lower back pain.  She is  followed by Dr. Nelva Bush.  History of total knee replacement, bilateral - Doing well.  She has warmth of both knee replacements.  She has limited extension of the left knee which is replaced.  She has occasional discomfort in the left knee joint.  Idiopathic chronic gout of multiple sites without tophus - She has not had any recent gout flares.  She is clinically doing well on allopurinol 300 mg 1 tablet by mouth daily.  She takes colchicine 0.6 mg 1 tablet by mouth as needed during flares.  She would like a refill of colchicine due to her prescription being expired.  Her uric acid level was 4.7 on 10/09/2018.  Other medical conditions are listed as follows:  History of sleep apnea - She uses CPAP.  History of migraine  History of depression   Orders: Orders Placed This Encounter  Procedures  . Trigger Point Inj   Meds ordered this encounter  Medications  . colchicine 0.6 MG tablet    Sig: Take 1 tablet by mouth daily as needed during gout flares.    Dispense:  30 tablet    Refill:  0  . methocarbamol (ROBAXIN) 500 MG tablet    Sig: Take 1 tablet (500 mg total) by mouth 2 (two) times daily as needed for muscle spasms.    Dispense:  60 tablet    Refill:  0    Face-to-face time spent with patient was 30 minutes. Greater than 50% of time was spent in counseling and coordination of care.  Follow-Up Instructions: Return in about 6 months (around 01/08/2020) for Fibromyalgia, Osteoarthritis, DDD, Gout.   Ofilia Neas, PA-C   I examined and evaluated the patient with Hazel Sams PA.  Patient continues to have some discomfort from underlying fibromyalgia.  She had a lot of trapezius tenderness on my examination today.  Per request bilateral trapezius area were injected with cortisone as described above.  She tolerated the procedure well.  The plan of care was discussed as noted above.  Bo Merino, MD  Note - This record has been created using Editor, commissioning.  Chart creation  errors have been sought, but may not always  have been located. Such creation errors do not reflect on  the standard of medical care.

## 2019-06-26 ENCOUNTER — Ambulatory Visit: Payer: Medicare HMO

## 2019-06-29 DIAGNOSIS — Z8719 Personal history of other diseases of the digestive system: Secondary | ICD-10-CM | POA: Diagnosis not present

## 2019-06-29 DIAGNOSIS — R110 Nausea: Secondary | ICD-10-CM | POA: Diagnosis not present

## 2019-07-01 ENCOUNTER — Other Ambulatory Visit: Payer: Self-pay

## 2019-07-01 ENCOUNTER — Telehealth: Payer: Self-pay | Admitting: Skilled Nursing Facility1

## 2019-07-01 ENCOUNTER — Encounter: Payer: Self-pay | Admitting: Family Medicine

## 2019-07-01 ENCOUNTER — Ambulatory Visit (INDEPENDENT_AMBULATORY_CARE_PROVIDER_SITE_OTHER): Payer: Medicare HMO | Admitting: Family Medicine

## 2019-07-01 VITALS — BP 128/80 | HR 84 | Temp 97.6°F | Ht 62.5 in | Wt 230.4 lb

## 2019-07-01 DIAGNOSIS — R350 Frequency of micturition: Secondary | ICD-10-CM | POA: Diagnosis not present

## 2019-07-01 DIAGNOSIS — R1032 Left lower quadrant pain: Secondary | ICD-10-CM

## 2019-07-01 LAB — POCT URINALYSIS DIP (PROADVANTAGE DEVICE)
Bilirubin, UA: NEGATIVE
Blood, UA: NEGATIVE
Glucose, UA: NEGATIVE mg/dL
Ketones, POC UA: NEGATIVE mg/dL
Nitrite, UA: NEGATIVE
Protein Ur, POC: NEGATIVE mg/dL
Specific Gravity, Urine: 1.015
Urobilinogen, Ur: NEGATIVE
pH, UA: 6 (ref 5.0–8.0)

## 2019-07-01 NOTE — Telephone Encounter (Signed)
LVM for missed call

## 2019-07-01 NOTE — Progress Notes (Signed)
Chief Complaint  Patient presents with  . Urinary Frequency    feels like her bladder is not empty when she finishes urinating.    About 4 days ago she started noticing like urine wasn't emptying well, felt like she had to go frequently, had a discomfort (unable to clarify).  Urine appears normal--no odor, no blood, light in color. Denies dysuria, suprapubic pain.  Last night she started with a pain at the LLQ, which radiates to the groin and bladder area. Not really sore to touch, just slightly. Seems to be worse when standing.  She has been having some constipation, though tends to move bowels somewhat daily.  Just prior to visit had a large bowel movement.  Didn't ease the discomfort at all. Denies blood in the stool.  She has some nausea related to her discomfort, slightly more than usual. No diarrhea, no mucus or blood in the stools.  No vaginal discharge.  No flank pain, some discomfort across the lower back, more on the left.  Having more gas recently, though not as much yesterday or today. Eating more vegetables.   PMH, PSH, SH reviewed  Outpatient Encounter Medications as of 07/01/2019  Medication Sig Note  . acetaminophen (TYLENOL) 500 MG tablet Take 1,000 mg by mouth 3 (three) times daily as needed for moderate pain.   Marland Kitchen allopurinol (ZYLOPRIM) 300 MG tablet TAKE 1 TABLET (300 MG TOTAL) DAILY BY MOUTH.   Marland Kitchen ALPRAZolam (XANAX) 0.5 MG tablet Take 0.5 mg by mouth 3 (three) times daily as needed for anxiety. 04/15/2019: Uses it most days, once a day  . ARIPiprazole (ABILIFY) 5 MG tablet Take 5 mg by mouth daily.   . Armodafinil 250 MG tablet TAKE 1 TABLET EVERY DAY WITH BREAKFAST   . atorvastatin (LIPITOR) 40 MG tablet TAKE 1 TABLET BY MOUTH EVERY DAY   . cetirizine (ZYRTEC) 10 MG tablet Take 10 mg by mouth at bedtime.   . cholecalciferol (VITAMIN D) 1000 units tablet Take 1,000 Units by mouth daily.   . DULoxetine (CYMBALTA) 60 MG capsule Take 60 mg by mouth 2 (two) times daily.     . famotidine (PEPCID) 40 MG tablet Take 40 mg by mouth 2 (two) times daily. 07/01/2019: daily  . fluticasone (FLONASE) 50 MCG/ACT nasal spray PLACE 2 SPRAYS INTO BOTH NOSTRILS DAILY AS NEEDED FOR ALLERGIES   . gabapentin (NEURONTIN) 300 MG capsule Take 1 capsule (300 mg total) by mouth 3 (three) times daily. Gabapentin 300 mg Protocol Take a 300 mg capsule three times a day for two weeks following surgery. Then take a 300 mg capsule two times a day for two weeks.  Then resume a 300 mg capsule once a day at bedtime. 10/13/2018: Taking 300mg  TID  . guaiFENesin (MUCINEX) 600 MG 12 hr tablet Take 600 mg by mouth 2 (two) times daily.   . Melatonin 10 MG TABS Take 10 mg by mouth at bedtime.   . methocarbamol (ROBAXIN) 500 MG tablet TAKE 1 TABLET (500 MG TOTAL) BY MOUTH 2 (TWO) TIMES DAILY AS NEEDED FOR MUSCLE SPASMS.   . metoprolol tartrate (LOPRESSOR) 50 MG tablet Take 1 tablet (50 mg total) by mouth 2 (two) times daily.   . Multiple Vitamins-Minerals (ICAPS AREDS 2) CAPS Take 1 capsule by mouth 2 (two) times daily.   . nortriptyline (PAMELOR) 25 MG capsule Take 75 mg by mouth at bedtime.    . Omega-3 Fatty Acids (FISH OIL) 1200 MG CAPS Take 1,200 mg by mouth daily.   Marland Kitchen  pantoprazole (PROTONIX) 40 MG tablet Take 40 mg by mouth 2 (two) times daily.   . Probiotic Product ([place]) CAPS Take 1 capsule by mouth at bedtime.    . prochlorperazine (COMPAZINE) 10 MG tablet Take 10 mg by mouth every 6 (six) hours as needed for nausea or vomiting.    Marland Kitchen Propylene Glycol (SYSTANE COMPLETE) 0.6 % SOLN Place 1 drop into both eyes 2 (two) times daily.   Marland Kitchen topiramate (TOPAMAX) 25 MG tablet TAKE 2 TABLETS (50 MG TOTAL) BY MOUTH AT BEDTIME   . vitamin B-12 (CYANOCOBALAMIN) 1000 MCG tablet Take 1,000 mcg by mouth daily.   Marland Kitchen XIIDRA 5 % SOLN Apply 1 drop to eye 2 (two) times daily.   Marland Kitchen albuterol (PROVENTIL HFA;VENTOLIN HFA) 108 (90 Base) MCG/ACT inhaler Inhale 1-2 puffs into the lungs every 6 (six) hours as  needed for wheezing or shortness of breath. (Patient not taking: Reported on 04/15/2019)   . aspirin 81 MG tablet aspirin 81 mg tablet,delayed release  TAKE 1 TABLET BY MOUTH EVERY DAY   . colchicine 0.6 MG tablet Take 2 tablets by mouth at onset of gout flare. Taken an additional tablet 1 hour later, if needed. May take like this every 3d, if needed (Patient not taking: Reported on 04/15/2019)   . HYDROcodone-acetaminophen (NORCO/VICODIN) 5-325 MG tablet Take 1 tablet by mouth 2 (two) times daily as needed.   . ondansetron (ZOFRAN) 8 MG tablet Take 8 mg by mouth every 8 (eight) hours as needed for nausea.    . [DISCONTINUED] carbamazepine (TEGRETOL XR) 100 MG 12 hr tablet Take 200 mg by mouth daily.     . [DISCONTINUED] Eszopiclone 3 MG TABS Take 3 mg by mouth at bedtime. Take immediately before bedtime    No facility-administered encounter medications on file as of 07/01/2019.    Allergies  Allergen Reactions  . Erythromycin Nausea Only    Abdominal pain  . Tramadol Itching  . Dilaudid [Hydromorphone Hcl] Itching   ROS: no fever, chills, URI symptoms, headache, dizziness, chest pain, rash or other complaints, except as noted in HPI. +urinary complaints.  PHYSICAL EXAM:  BP 128/80   Pulse 84   Temp 97.6 F (36.4 C) (Temporal)   Ht 5' 2.5" (1.588 m)   Wt 230 lb 6.4 oz (104.5 kg)   LMP 07/27/2006   BMI 41.47 kg/m   Wt Readings from Last 3 Encounters:  07/01/19 230 lb 6.4 oz (104.5 kg)  05/25/19 228 lb (103.4 kg)  04/15/19 230 lb (104.3 kg)   Well-appearing, pleasant female in no distress Heart: regular rate and rhythm Lungs: clear bilaterally Back: No CVA tenderness (area of discomfort is lower left back, over muscles, no spasm). No spinal tenderness Abdomen: soft. Normal bowel sounds.  Mildly tender at LLQ. No rebound tenderness or guarding.  Standing--no bulge or asymmetry noted on the left.  +umbilical hernia.  nontender in suprapubic area  Urine dip: trace  leuks  ASSESSMENT/PLAN:  Urinary frequency - doubt UTI, will check culture to confirm - Plan: POCT Urinalysis DIP (Proadvantage Device), Urine Culture  LLQ pain - Ddx reviewed in detail; gas/constipation may be contributing. consider simethicone, miralax prn. f/u if worsening    Drink plenty of water. We are sending your urine for culture to confirm whether or not there is an infection--only trace white blood cells were noted, not necessarily indicating an infection. Your location of pain (left lower stomach) is not typical of a bladder infection. You had no blood in  the urine, so no evidence of kidney stones. Constipation can contribute to left sided pain--try some Miralax for a few days to get your bowels moving more regularly.  Hopefully this will help decrease your left sided discomfort. You can also try Simethicone (Gas-X) for acute pain. You may need additional evaluation if your left sided pain gets worse.  (we briefly discussed other diagnoses causing left sided pain, including ovarian cysts or other ovarian issues, diverticulitis, kidney stones), but I do not necessarily think this is the cause of your pain right now.  Further investigation will be needed if your pain worsens rather than improves.

## 2019-07-01 NOTE — Patient Instructions (Signed)
  Drink plenty of water. We are sending your urine for culture to confirm whether or not there is an infection--only trace white blood cells were noted, not necessarily indicating an infection. Your location of pain (left lower stomach) is not typical of a bladder infection. You had no blood in the urine, so no evidence of kidney stones. Constipation can contribute to left sided pain--try some Miralax for a few days to get your bowels moving more regularly.  Hopefully this will help decrease your left sided discomfort. You can also try Simethicone (Gas-X) for acute pain. You may need additional evaluation if your left sided pain gets worse.  (we briefly discussed other diagnoses causing left sided pain, including ovarian cysts or other ovarian issues, diverticulitis, kidney stones), but I do not necessarily think this is the cause of your pain right now.  Further investigation will be needed if your pain worsens rather than improves.

## 2019-07-02 LAB — URINE CULTURE

## 2019-07-08 ENCOUNTER — Other Ambulatory Visit: Payer: Self-pay

## 2019-07-08 ENCOUNTER — Ambulatory Visit (INDEPENDENT_AMBULATORY_CARE_PROVIDER_SITE_OTHER): Payer: Medicare HMO | Admitting: Rheumatology

## 2019-07-08 ENCOUNTER — Encounter: Payer: Self-pay | Admitting: Rheumatology

## 2019-07-08 VITALS — BP 105/67 | HR 84 | Resp 18 | Ht 62.5 in | Wt 230.2 lb

## 2019-07-08 DIAGNOSIS — Z8659 Personal history of other mental and behavioral disorders: Secondary | ICD-10-CM

## 2019-07-08 DIAGNOSIS — Z8669 Personal history of other diseases of the nervous system and sense organs: Secondary | ICD-10-CM | POA: Diagnosis not present

## 2019-07-08 DIAGNOSIS — M5136 Other intervertebral disc degeneration, lumbar region: Secondary | ICD-10-CM | POA: Diagnosis not present

## 2019-07-08 DIAGNOSIS — M797 Fibromyalgia: Secondary | ICD-10-CM | POA: Diagnosis not present

## 2019-07-08 DIAGNOSIS — M62838 Other muscle spasm: Secondary | ICD-10-CM | POA: Diagnosis not present

## 2019-07-08 DIAGNOSIS — M160 Bilateral primary osteoarthritis of hip: Secondary | ICD-10-CM | POA: Diagnosis not present

## 2019-07-08 DIAGNOSIS — M5030 Other cervical disc degeneration, unspecified cervical region: Secondary | ICD-10-CM | POA: Diagnosis not present

## 2019-07-08 DIAGNOSIS — M1A09X0 Idiopathic chronic gout, multiple sites, without tophus (tophi): Secondary | ICD-10-CM

## 2019-07-08 DIAGNOSIS — R296 Repeated falls: Secondary | ICD-10-CM | POA: Diagnosis not present

## 2019-07-08 DIAGNOSIS — Z96653 Presence of artificial knee joint, bilateral: Secondary | ICD-10-CM | POA: Diagnosis not present

## 2019-07-08 DIAGNOSIS — R69 Illness, unspecified: Secondary | ICD-10-CM | POA: Diagnosis not present

## 2019-07-08 MED ORDER — LIDOCAINE HCL 1 % IJ SOLN
0.5000 mL | INTRAMUSCULAR | Status: AC | PRN
  Administered 2019-07-08: .5 mL

## 2019-07-08 MED ORDER — TRIAMCINOLONE ACETONIDE 40 MG/ML IJ SUSP
10.0000 mg | INTRAMUSCULAR | Status: AC | PRN
  Administered 2019-07-08: 10 mg via INTRAMUSCULAR

## 2019-07-08 MED ORDER — COLCHICINE 0.6 MG PO TABS: ORAL_TABLET | ORAL | 0 refills | Status: DC

## 2019-07-08 MED ORDER — METHOCARBAMOL 500 MG PO TABS: 500.0000 mg | ORAL_TABLET | Freq: Two times a day (BID) | ORAL | 0 refills | Status: DC | PRN

## 2019-07-16 DIAGNOSIS — K648 Other hemorrhoids: Secondary | ICD-10-CM | POA: Diagnosis not present

## 2019-07-16 DIAGNOSIS — Z1211 Encounter for screening for malignant neoplasm of colon: Secondary | ICD-10-CM | POA: Diagnosis not present

## 2019-07-17 ENCOUNTER — Ambulatory Visit: Payer: Medicare HMO | Admitting: Gynecology

## 2019-07-17 ENCOUNTER — Encounter: Payer: Self-pay | Admitting: Gynecology

## 2019-07-17 ENCOUNTER — Other Ambulatory Visit: Payer: Self-pay

## 2019-07-17 VITALS — BP 122/78

## 2019-07-17 DIAGNOSIS — R1032 Left lower quadrant pain: Secondary | ICD-10-CM

## 2019-07-17 NOTE — Progress Notes (Signed)
    [name] 11/29/1955 [id number]        63 y.o.  [name] presents with 3 weeks of left lower quadrant pain.  Comes and goes.  Seems to be positional with increases with movement when she rolls over in bed.  No nausea vomiting diarrhea constipation.  Had colonoscopy yesterday and mention it to her gastroenterologist who suggested she see her gynecologist to rule out ovarian.  No urinary symptoms such as frequency dysuria urgency.  Past medical history,surgical history, problem list, medications, allergies, family history and social history were all reviewed and documented in the EPIC chart.  Directed ROS with pertinent positives and negatives documented in the history of present illness/assessment and plan.  Exam: [name] assistant Vitals:   07/17/19 1218  BP: 122/78   General appearance:  Normal Abdomen soft nontender without masses guarding rebound.  Umbilical hernia, reducible nontender. Pelvic external BUS vagina with atrophic changes.  Cervix with atrophic changes.  Uterus unable to palpate but no gross masses or tenderness.  Rectal exam is normal  Assessment/Plan:  63 y.o. [name] with 3 weeks of left lower quadrant pain.  Higher out of the pelvis on patient identification.  No localizing symptoms.  Recommend ultrasound for pelvic clearance given limits of physical exam.  Check baseline urine analysis also.  Patient will follow-up for the ultrasound and then we will go from there.    [name], 12:33 PM 07/17/2019

## 2019-07-17 NOTE — Patient Instructions (Signed)
Follow-up for the ultrasound as scheduled. 

## 2019-07-20 ENCOUNTER — Other Ambulatory Visit: Payer: Medicare HMO

## 2019-07-20 ENCOUNTER — Ambulatory Visit: Payer: Medicare HMO | Admitting: Gynecology

## 2019-07-21 ENCOUNTER — Ambulatory Visit: Payer: Medicare HMO | Admitting: Gynecology

## 2019-07-22 ENCOUNTER — Telehealth: Payer: Self-pay | Admitting: Family Medicine

## 2019-07-22 MED ORDER — MECLIZINE HCL 25 MG PO TABS: 25.0000 mg | ORAL_TABLET | Freq: Three times a day (TID) | ORAL | 0 refills | Status: AC | PRN

## 2019-07-22 NOTE — Telephone Encounter (Signed)
Pt called and wanted to know if she can get a refill on Meclizine for her vertigo. Sent to the [facility] on [facility]

## 2019-07-22 NOTE — Telephone Encounter (Signed)
This was sent to me, is this okay?

## 2019-07-22 NOTE — Telephone Encounter (Signed)
done

## 2019-07-31 ENCOUNTER — Other Ambulatory Visit: Payer: Self-pay | Admitting: Physician Assistant

## 2019-07-31 NOTE — Telephone Encounter (Signed)
[facility] for patient to call ,and schedule her follow up appt in February 2021 with Dr. Rob Hickman.

## 2019-07-31 NOTE — Telephone Encounter (Signed)
Please schedule patient for a follow up visit. Patient due February 2021. Thanks!  

## 2019-07-31 NOTE — Telephone Encounter (Signed)
Last Visit: 07/08/19 Next Visit: due February 2021. Message sent to the front to schedule patient Labs: 04/13/19 Glucose 106 Sodium 145  Okay to refill per Dr. Estanislado Pandy

## 2019-08-04 ENCOUNTER — Telehealth: Payer: Self-pay | Admitting: Skilled Nursing Facility1

## 2019-08-04 ENCOUNTER — Other Ambulatory Visit: Payer: Self-pay | Admitting: Physician Assistant

## 2019-08-04 NOTE — Telephone Encounter (Signed)
Returned pts call.  Pt states she does want to come in to pre-op class again for a refresher and had some questions about multivitamins. These questions were answered.

## 2019-08-04 NOTE — Telephone Encounter (Signed)
Last Visit: 07/08/19 Next Visit: due February 2021  Okay to refill per Dr. Estanislado Pandy

## 2019-08-05 ENCOUNTER — Other Ambulatory Visit: Payer: Medicare HMO

## 2019-08-05 ENCOUNTER — Encounter: Payer: Self-pay | Admitting: Family Medicine

## 2019-08-05 ENCOUNTER — Other Ambulatory Visit: Payer: Self-pay

## 2019-08-05 ENCOUNTER — Ambulatory Visit: Payer: Medicare HMO | Admitting: Gynecology

## 2019-08-05 ENCOUNTER — Ambulatory Visit (INDEPENDENT_AMBULATORY_CARE_PROVIDER_SITE_OTHER): Payer: Medicare HMO | Admitting: Family Medicine

## 2019-08-05 VITALS — BP 110/80 | HR 109 | Temp 97.8°F | Ht 62.5 in | Wt 225.0 lb

## 2019-08-05 DIAGNOSIS — R05 Cough: Secondary | ICD-10-CM

## 2019-08-05 DIAGNOSIS — R0602 Shortness of breath: Secondary | ICD-10-CM

## 2019-08-05 DIAGNOSIS — R058 Other specified cough: Secondary | ICD-10-CM

## 2019-08-05 DIAGNOSIS — R197 Diarrhea, unspecified: Secondary | ICD-10-CM

## 2019-08-05 DIAGNOSIS — B349 Viral infection, unspecified: Secondary | ICD-10-CM

## 2019-08-05 DIAGNOSIS — R0981 Nasal congestion: Secondary | ICD-10-CM | POA: Diagnosis not present

## 2019-08-05 DIAGNOSIS — R6889 Other general symptoms and signs: Secondary | ICD-10-CM | POA: Diagnosis not present

## 2019-08-05 DIAGNOSIS — Z20822 Contact with and (suspected) exposure to covid-19: Secondary | ICD-10-CM

## 2019-08-05 NOTE — Progress Notes (Signed)
Start time: 9:38 End time: 9:56  Virtual Visit via Video Note  I connected with [name] on 08/05/19 at  9:30 AM EDT by a video enabled telemedicine application and verified that I am speaking with the correct person using two identifiers.  Location: Patient: home, husband is in the other room Provider: office   I discussed the limitations of evaluation and management by telemedicine and the availability of in person appointments. The patient expressed understanding and agreed to proceed.  History of Present Illness:  Chief Complaint  Patient presents with  . Sore Throat    weakness, no fever, cough, no discolored mucus-only slightly yellow when she blows her nose but clear this am. SOB at times. Last night around 9pm was the worst. Has had some diarrhea and vomiting yesterday. Lots of nasal congestion. Taking tylenol for HA, mucinex and using neti-pot.   3 days ago she started with sore throat, the following day started with congestion. Nasal drainage is mostly clear, sometimes is a little yellow.  She denies sinus pain, but has had a headache at her right temple.  She denies any chest congestion.  Denies fever. She is having a nonproductive cough. Not keeping her up.  Hasn't needed to use her albuterol.  She has had some shortness of breath--last night when she felt weak, she was having some shortness of breath with moving around the house.  Short of breath going up her stairs last night. Doesn't feel like she is tight/wheezing. Felt better after a few minutes of rest.  She has been using plain mucinex, and tylenol for the headache. She continues to take Zyrtec. Last night she felt "really bad"--weaker than normal.  No focal area of weakness, just generalized.  She isn't as weak today, but doesn't feel great. She is drinking fluids, not eating as much. She has had diarrhea, and vomited once.  She kept her cereal down this morning.  +sick grandson--just runny nose, poss  allergies.  She was supposed to be around mother and sisters (anniversary of her dad's death), but she didn't because she isn't feeling well.  PMH, PSH, SH reviewed  Outpatient Encounter Medications as of 08/05/2019  Medication Sig Note  . acetaminophen (TYLENOL) 500 MG tablet Take 1,000 mg by mouth 3 (three) times daily as needed for moderate pain.   Marland Kitchen allopurinol (ZYLOPRIM) 300 MG tablet TAKE 1 TABLET (300 MG TOTAL) DAILY BY MOUTH.   Marland Kitchen ALPRAZolam (XANAX) 0.5 MG tablet Take 0.5 mg by mouth 3 (three) times daily as needed for anxiety. 04/15/2019: Uses it most days, once a day  . ARIPiprazole (ABILIFY) 5 MG tablet Take 5 mg by mouth daily.   . Armodafinil 250 MG tablet TAKE 1 TABLET EVERY DAY WITH BREAKFAST   . aspirin 81 MG tablet aspirin 81 mg tablet,delayed release  TAKE 1 TABLET BY MOUTH EVERY DAY   . atorvastatin (LIPITOR) 40 MG tablet TAKE 1 TABLET BY MOUTH EVERY DAY   . cetirizine (ZYRTEC) 10 MG tablet Take 10 mg by mouth at bedtime.   . cholecalciferol (VITAMIN D) 1000 units tablet Take 1,000 Units by mouth daily.   . DULoxetine (CYMBALTA) 60 MG capsule Take 60 mg by mouth 2 (two) times daily.    . famotidine (PEPCID) 40 MG tablet Take 40 mg by mouth 2 (two) times daily. 07/01/2019: daily  . fluticasone (FLONASE) 50 MCG/ACT nasal spray PLACE 2 SPRAYS INTO BOTH NOSTRILS DAILY AS NEEDED FOR ALLERGIES   . gabapentin (NEURONTIN) 300 MG  capsule Take 1 capsule (300 mg total) by mouth 3 (three) times daily. Gabapentin 300 mg Protocol Take a 300 mg capsule three times a day for two weeks following surgery. Then take a 300 mg capsule two times a day for two weeks.  Then resume a 300 mg capsule once a day at bedtime. 10/13/2018: Taking 300mg  TID  . guaiFENesin (MUCINEX) 600 MG 12 hr tablet Take 600 mg by mouth 2 (two) times daily.   . Melatonin 10 MG TABS Take 10 mg by mouth at bedtime.   . metoprolol tartrate (LOPRESSOR) 50 MG tablet Take 1 tablet (50 mg total) by mouth 2 (two) times daily.   .  Multiple Vitamins-Minerals (ICAPS AREDS 2) CAPS Take 1 capsule by mouth 2 (two) times daily.   . nortriptyline (PAMELOR) 25 MG capsule Take 75 mg by mouth at bedtime.    . Omega-3 Fatty Acids (FISH OIL) 1200 MG CAPS Take 1,200 mg by mouth daily.   . pantoprazole (PROTONIX) 40 MG tablet Take 40 mg by mouth 2 (two) times daily.   . Probiotic Product ([place]) CAPS Take 1 capsule by mouth at bedtime.    . prochlorperazine (COMPAZINE) 10 MG tablet Take 10 mg by mouth every 6 (six) hours as needed for nausea or vomiting.    Marland Kitchen Propylene Glycol (SYSTANE COMPLETE) 0.6 % SOLN Place 1 drop into both eyes 2 (two) times daily.   Marland Kitchen topiramate (TOPAMAX) 25 MG tablet TAKE 2 TABLETS (50 MG TOTAL) BY MOUTH AT BEDTIME   . vitamin B-12 (CYANOCOBALAMIN) 1000 MCG tablet Take 1,000 mcg by mouth daily.   Marland Kitchen XIIDRA 5 % SOLN Apply 1 drop to eye 2 (two) times daily.   Marland Kitchen albuterol (PROVENTIL HFA;VENTOLIN HFA) 108 (90 Base) MCG/ACT inhaler Inhale 1-2 puffs into the lungs every 6 (six) hours as needed for wheezing or shortness of breath. (Patient not taking: Reported on 08/05/2019)   . colchicine 0.6 MG tablet TAKE 1 TABLET BY MOUTH DAILY AS NEEDED DURING GOUT FLARES. (Patient not taking: Reported on 08/05/2019)   . meclizine (ANTIVERT) 25 MG tablet Take 1 tablet (25 mg total) by mouth 3 (three) times daily as needed for dizziness. (Patient not taking: Reported on 08/05/2019)   . methocarbamol (ROBAXIN) 500 MG tablet TAKE 1 TABLET (500 MG TOTAL) BY MOUTH 2 (TWO) TIMES DAILY AS NEEDED FOR MUSCLE SPASMS. (Patient not taking: Reported on 08/05/2019)   . ondansetron (ZOFRAN) 8 MG tablet Take 8 mg by mouth every 8 (eight) hours as needed for nausea.    . [DISCONTINUED] carbamazepine (TEGRETOL XR) 100 MG 12 hr tablet Take 200 mg by mouth daily.     . [DISCONTINUED] Eszopiclone 3 MG TABS Take 3 mg by mouth at bedtime. Take immediately before bedtime    No facility-administered encounter medications on file as of 08/05/2019.     Allergies  Allergen Reactions  . Erythromycin Nausea Only    Abdominal pain  . Tramadol Itching  . Dilaudid [Hydromorphone Hcl] Itching   ROS: no fever.  +congestion, URI symptoms, vomiting and diarrhea per HPI.  No loss of smell.  Some DOE.  No bleeding or skin rashes. See HPI    Observations/Objective:  BP 110/80   Pulse (!) 109   Temp 97.8 F (36.6 C) (Oral)   Ht 5' 2.5" (1.588 m)   Wt 225 lb (102.1 kg)   LMP 07/27/2006   BMI 40.50 kg/m   Well-appearing, pleasant female, in no distress.  She sounds congested.  Rare dry cough.  She is speaking easily in full sentences. She is alert, oriented. Cranial nerves are grossly intact. Area of pain is her right temple, nontender for patient to press on this area.  Assessment and Plan:  Acute viral syndrome  Nonproductive cough  Nasal congestion  Shortness of breath  Diarrhea, unspecified type  Advised to get COVID test today. Supportive measures reviewed in detail, and advised to quarantine until results are known. BRAT diet, avoid dairy  Follow Up Instructions:    I discussed the assessment and treatment plan with the patient. The patient was provided an opportunity to ask questions and all were answered. The patient agreed with the plan and demonstrated an understanding of the instructions.   The patient was advised to call back or seek an in-person evaluation if the symptoms worsen or if the condition fails to improve as anticipated.  I provided 18 minutes of non-face-to-face time during this encounter.   Vikki Ports, MD

## 2019-08-05 NOTE — Patient Instructions (Signed)
I recommend you go to the [facility] for [facility] [id number] testing.  Please continue to take mucinex, cetirizine, do Neti-pot rinses as needed, and use tylenol as needed for fever or pain.  We discussed avoiding dairy due to having diarrhea, and eating bland foods (bananas, rice, applesauce, toast).  We discussed avoiding all contact with others while awaiting your test results.   Food Choices to Help Relieve Diarrhea, Adult When you have diarrhea, the foods you eat and your eating habits are very important. Choosing the right foods and drinks can help:  Relieve diarrhea.  Replace lost fluids and nutrients.  Prevent dehydration. What general guidelines should I follow?  Relieving diarrhea  Choose foods with less than 2 g or .07 oz. of fiber per serving.  Limit fats to less than 8 tsp (38 g or 1.34 oz.) a day.  Avoid the following: ? Foods and beverages sweetened with high-fructose corn syrup, honey, or sugar alcohols such as xylitol, sorbitol, and mannitol. ? Foods that contain a lot of fat or sugar. ? Fried, greasy, or spicy foods. ? High-fiber grains, breads, and cereals. ? Raw fruits and vegetables.  Eat foods that are rich in probiotics. These foods include dairy products such as yogurt and fermented milk products. They help increase healthy bacteria in the stomach and intestines (gastrointestinal tract, or GI tract).  If you have lactose intolerance, avoid dairy products. These may make your diarrhea worse.  Take medicine to help stop diarrhea (antidiarrheal medicine) only as told by your health care provider. Replacing nutrients  Eat small meals or snacks every 3-4 hours.  Eat bland foods, such as white rice, toast, or baked potato, until your diarrhea starts to get better. Gradually reintroduce nutrient-rich foods as tolerated or as told by your health care provider. This includes: ? Well-cooked protein foods. ? Peeled, seeded, and soft-cooked fruits and  vegetables. ? Low-fat dairy products.  Take vitamin and mineral supplements as told by your health care provider. Preventing dehydration  Start by sipping water or a special solution to prevent dehydration (oral rehydration solution, ORS). Urine that is clear or pale yellow means that you are getting enough fluid.  Try to drink at least 8-10 cups of fluid each day to help replace lost fluids.  You may add other liquids in addition to water, such as clear juice or decaffeinated sports drinks, as tolerated or as told by your health care provider.  Avoid drinks with caffeine, such as coffee, tea, or soft drinks.  Avoid alcohol. What foods are recommended?     The items listed may not be a complete list. Talk with your health care provider about what dietary choices are best for you. Grains White rice. White, [place], or pita breads (fresh or toasted), including plain rolls, buns, or bagels. White pasta. Saltine, soda, or graham crackers. Pretzels. Low-fiber cereal. Cooked cereals made with water (such as cornmeal, farina, or cream cereals). Plain muffins. Matzo. Melba toast. Zwieback. Vegetables Potatoes (without the skin). Most well-cooked and canned vegetables without skins or seeds. Tender lettuce. Fruits Apple sauce. Fruits canned in juice. Cooked apricots, cherries, grapefruit, peaches, pears, or plums. Fresh bananas and cantaloupe. Meats and other protein foods Baked or boiled chicken. Eggs. Tofu. Fish. Seafood. Smooth nut butters. Ground or well-cooked tender beef, ham, veal, lamb, pork, or poultry. Dairy Plain yogurt, kefir, and unsweetened liquid yogurt. Lactose-free milk, buttermilk, skim milk, or soy milk. Low-fat or nonfat hard cheese. Beverages Water. Low-calorie sports drinks. Fruit juices without pulp.  Strained tomato and vegetable juices. Decaffeinated teas. Sugar-free beverages not sweetened with sugar alcohols. Oral rehydration solutions, if approved by your health care  provider. Seasoning and other foods Bouillon, broth, or soups made from recommended foods. What foods are not recommended? The items listed may not be a complete list. Talk with your health care provider about what dietary choices are best for you. Grains Whole grain, whole wheat, bran, or rye breads, rolls, pastas, and crackers. Wild or brown rice. Whole grain or bran cereals. Barley. Oats and oatmeal. Corn tortillas or taco shells. Granola. Popcorn. Vegetables Raw vegetables. Fried vegetables. Cabbage, broccoli, Brussels sprouts, artichokes, baked beans, beet greens, corn, kale, legumes, peas, sweet potatoes, and yams. Potato skins. Cooked spinach and cabbage. Fruits Dried fruit, including raisins and dates. Raw fruits. Stewed or dried prunes. Canned fruits with syrup. Meat and other protein foods Fried or fatty meats. Deli meats. Chunky nut butters. Nuts and seeds. Beans and lentils. Berniece Salines. Hot dogs. Sausage. Dairy High-fat cheeses. Whole milk, chocolate milk, and beverages made with milk, such as milk shakes. Half-and-half. Cream. sour cream. Ice cream. Beverages Caffeinated beverages (such as coffee, tea, soda, or energy drinks). Alcoholic beverages. Fruit juices with pulp. Prune juice. Soft drinks sweetened with high-fructose corn syrup or sugar alcohols. High-calorie sports drinks. Fats and oils Butter. Cream sauces. Margarine. Salad oils. Plain salad dressings. Olives. Avocados. Mayonnaise. Sweets and desserts Sweet rolls, doughnuts, and sweet breads. Sugar-free desserts sweetened with sugar alcohols such as xylitol and sorbitol. Seasoning and other foods Honey. Hot sauce. Chili powder. Gravy. Cream-based or milk-based soups. Pancakes and waffles. Summary  When you have diarrhea, the foods you eat and your eating habits are very important.  Make sure you get at least 8-10 cups of fluid each day, or enough to keep your urine clear or pale yellow.  Eat bland foods and gradually  reintroduce healthy, nutrient-rich foods as tolerated, or as told by your health care provider.  Avoid high-fiber, fried, greasy, or spicy foods. This information is not intended to replace advice given to you by your health care provider. Make sure you discuss any questions you have with your health care provider. Document Released: 02/02/2004 Document Revised: 03/05/2019 Document Reviewed: 11/09/2016 Elsevier Patient Education  2020 [employer].    COVID-19 COVID-19 is a respiratory infection that is caused by a virus called severe acute respiratory syndrome coronavirus 2 (SARS-CoV-2). The disease is also known as coronavirus disease or novel coronavirus. In some people, the virus may not cause any symptoms. In others, it may cause a serious infection. The infection can get worse quickly and can lead to complications, such as:  Pneumonia, or infection of the lungs.  Acute respiratory distress syndrome or ARDS. This is fluid build-up in the lungs.  Acute respiratory failure. This is a condition in which there is not enough oxygen passing from the lungs to the body.  Sepsis or septic shock. This is a serious bodily reaction to an infection.  Blood clotting problems.  Secondary infections due to bacteria or fungus. The virus that causes COVID-19 is contagious. This means that it can spread from person to person through droplets from coughs and sneezes (respiratory secretions). What are the causes? This illness is caused by a virus. You may catch the virus by:  Breathing in droplets from an infected person's cough or sneeze.  Touching something, like a table or a doorknob, that was exposed to the virus (contaminated) and then touching your mouth, nose, or eyes. What increases  the risk? Risk for infection You are more likely to be infected with this virus if you:  Live in or travel to an area with a COVID-19 outbreak.  Come in contact with a sick person who recently traveled to an  area with a COVID-19 outbreak.  Provide care for or live with a person who is infected with COVID-19. Risk for serious illness You are more likely to become seriously ill from the virus if you:  Are 44 years of age or older.  Have a long-term disease that lowers your body's ability to fight infection (immunocompromised).  Live in a nursing home or long-term care facility.  Have a long-term (chronic) disease such as: ? Chronic lung disease, including chronic obstructive pulmonary disease or asthma ? Heart disease. ? Diabetes. ? Chronic kidney disease. ? Liver disease.  Are obese. What are the signs or symptoms? Symptoms of this condition can range from mild to severe. Symptoms may appear any time from 2 to 14 days after being exposed to the virus. They include:  A fever.  A cough.  Difficulty breathing.  Chills.  Muscle pains.  A sore throat.  Loss of taste or smell. Some people may also have stomach problems, such as nausea, vomiting, or diarrhea. Other people may not have any symptoms of COVID-19. How is this diagnosed? This condition may be diagnosed based on:  Your signs and symptoms, especially if: ? You live in an area with a COVID-19 outbreak. ? You recently traveled to or from an area where the virus is common. ? You provide care for or live with a person who was diagnosed with COVID-19.  A physical exam.  Lab tests, which may include: ? A nasal swab to take a sample of fluid from your nose. ? A throat swab to take a sample of fluid from your throat. ? A sample of mucus from your lungs (sputum). ? Blood tests.  Imaging tests, which may include, X-rays, CT scan, or ultrasound. How is this treated? At present, there is no medicine to treat COVID-19. Medicines that treat other diseases are being used on a trial basis to see if they are effective against COVID-19. Your health care provider will talk with you about ways to treat your symptoms. For most  people, the infection is mild and can be managed at home with rest, fluids, and over-the-counter medicines. Treatment for a serious infection usually takes places in a hospital intensive care unit (ICU). It may include one or more of the following treatments. These treatments are given until your symptoms improve.  Receiving fluids and medicines through an IV.  Supplemental oxygen. Extra oxygen is given through a tube in the nose, a face mask, or a hood.  Positioning you to lie on your stomach (prone position). This makes it easier for oxygen to get into the lungs.  Continuous positive airway pressure (CPAP) or bi-level positive airway pressure (BPAP) machine. This treatment uses mild air pressure to keep the airways open. A tube that is connected to a motor delivers oxygen to the body.  Ventilator. This treatment moves air into and out of the lungs by using a tube that is placed in your windpipe.  Tracheostomy. This is a procedure to create a hole in the neck so that a breathing tube can be inserted.  Extracorporeal membrane oxygenation (ECMO). This procedure gives the lungs a chance to recover by taking over the functions of the heart and lungs. It supplies oxygen to the body and  removes carbon dioxide. Follow these instructions at home: Lifestyle  If you are sick, stay home except to get medical care. Your health care provider will tell you how long to stay home. Call your health care provider before you go for medical care.  Rest at home as told by your health care provider.  Do not use any products that contain nicotine or tobacco, such as cigarettes, e-cigarettes, and chewing tobacco. If you need help quitting, ask your health care provider.  Return to your normal activities as told by your health care provider. Ask your health care provider what activities are safe for you. General instructions  Take over-the-counter and prescription medicines only as told by your health care  provider.  Drink enough fluid to keep your urine pale yellow.  Keep all follow-up visits as told by your health care provider. This is important. How is this prevented?  There is no vaccine to help prevent COVID-19 infection. However, there are steps you can take to protect yourself and others from this virus. To protect yourself:   Do not travel to areas where COVID-19 is a risk. The areas where COVID-19 is reported change often. To identify high-risk areas and travel restrictions, check the CDC travel website: FatFares.com.br  If you live in, or must travel to, an area where COVID-19 is a risk, take precautions to avoid infection. ? Stay away from people who are sick. ? Wash your hands often with soap and water for 20 seconds. If soap and water are not available, use an alcohol-based hand sanitizer. ? Avoid touching your mouth, face, eyes, or nose. ? Avoid going out in public, follow guidance from your state and local health authorities. ? If you must go out in public, wear a cloth face covering or face mask. ? Disinfect objects and surfaces that are frequently touched every day. This may include:  Counters and tables.  Doorknobs and light switches.  Sinks and faucets.  Electronics, such as phones, remote controls, keyboards, computers, and tablets. To protect others: If you have symptoms of COVID-19, take steps to prevent the virus from spreading to others.  If you think you have a COVID-19 infection, contact your health care provider right away. Tell your health care team that you think you may have a COVID-19 infection.  Stay home. Leave your house only to seek medical care. Do not use public transport.  Do not travel while you are sick.  Wash your hands often with soap and water for 20 seconds. If soap and water are not available, use alcohol-based hand sanitizer.  Stay away from other members of your household. Let healthy household members care for children  and pets, if possible. If you have to care for children or pets, wash your hands often and wear a mask. If possible, stay in your own room, separate from others. Use a different bathroom.  Make sure that all people in your household wash their hands well and often.  Cough or sneeze into a tissue or your sleeve or elbow. Do not cough or sneeze into your hand or into the air.  Wear a cloth face covering or face mask. Where to find more information  Centers for Disease Control and Prevention: PurpleGadgets.be  World Health Organization: https://www.castaneda.info/ Contact a health care provider if:  You live in or have traveled to an area where COVID-19 is a risk and you have symptoms of the infection.  You have had contact with someone who has COVID-19 and you have  symptoms of the infection. Get help right away if:  You have trouble breathing.  You have pain or pressure in your chest.  You have confusion.  You have bluish lips and fingernails.  You have difficulty waking from sleep.  You have symptoms that get worse. These symptoms may represent a serious problem that is an emergency. Do not wait to see if the symptoms will go away. Get medical help right away. Call your local emergency services (911 in the [place] Do not drive yourself to the hospital. Let the emergency medical personnel know if you think you have COVID-19. Summary  COVID-19 is a respiratory infection that is caused by a virus. It is also known as coronavirus disease or novel coronavirus. It can cause serious infections, such as pneumonia, acute respiratory distress syndrome, acute respiratory failure, or sepsis.  The virus that causes COVID-19 is contagious. This means that it can spread from person to person through droplets from coughs and sneezes.  You are more likely to develop a serious illness if you are 69 years of age or older, have a weak immunity, live in a nursing  home, or have chronic disease.  There is no medicine to treat COVID-19. Your health care provider will talk with you about ways to treat your symptoms.  Take steps to protect yourself and others from infection. Wash your hands often and disinfect objects and surfaces that are frequently touched every day. Stay away from people who are sick and wear a mask if you are sick. This information is not intended to replace advice given to you by your health care provider. Make sure you discuss any questions you have with your health care provider. Document Released: 12/18/2018 Document Revised: 04/09/2019 Document Reviewed: 12/18/2018 Elsevier Patient Education  2020 [employer].

## 2019-08-06 ENCOUNTER — Ambulatory Visit: Payer: Medicare HMO

## 2019-08-06 ENCOUNTER — Telehealth: Payer: Self-pay | Admitting: Family Medicine

## 2019-08-06 NOTE — Telephone Encounter (Signed)
Pt called and states that since her virtual visit yesterday the diarrhea has got worse and her pulse rate has increased to 121. She wants to know if she can take something for the diarrhea and has concerns about pulse. Please advise pt at [phone].

## 2019-08-06 NOTE — Telephone Encounter (Signed)
No recent antibiotics. No abdominal pain or fever. She was a little lightheaded this morning, resolved now.  She has been eating toast, and drinking lots of water. No further vomiting.  She is coughing a little more, throat feels a little better.  Advised that she can take imodium to help decrease the diarrhea.  Discussed continued hydration, s/sx dehydration and when she should go to ER for IV fluids. She sounded like she was doing okay--no longer dizzy, and able to keep down fluids--slowing down the diarrhea with imodium should help.  COVID results still pending.

## 2019-08-07 ENCOUNTER — Encounter (HOSPITAL_COMMUNITY): Payer: Self-pay | Admitting: Emergency Medicine

## 2019-08-07 ENCOUNTER — Emergency Department (HOSPITAL_COMMUNITY)
Admission: EM | Admit: 2019-08-07 | Discharge: 2019-08-07 | Disposition: A | Discharge status: Home / Self Care | Payer: Medicare HMO | Attending: Emergency Medicine | Admitting: Emergency Medicine

## 2019-08-07 ENCOUNTER — Other Ambulatory Visit: Payer: Self-pay

## 2019-08-07 ENCOUNTER — Telehealth: Payer: Self-pay | Admitting: Family Medicine

## 2019-08-07 DIAGNOSIS — M797 Fibromyalgia: Secondary | ICD-10-CM | POA: Diagnosis not present

## 2019-08-07 DIAGNOSIS — Z881 Allergy status to other antibiotic agents status: Secondary | ICD-10-CM | POA: Diagnosis not present

## 2019-08-07 DIAGNOSIS — Z885 Allergy status to narcotic agent status: Secondary | ICD-10-CM | POA: Diagnosis not present

## 2019-08-07 DIAGNOSIS — R42 Dizziness and giddiness: Secondary | ICD-10-CM | POA: Diagnosis not present

## 2019-08-07 DIAGNOSIS — Z79899 Other long term (current) drug therapy: Secondary | ICD-10-CM | POA: Insufficient documentation

## 2019-08-07 DIAGNOSIS — Z7982 Long term (current) use of aspirin: Secondary | ICD-10-CM | POA: Insufficient documentation

## 2019-08-07 DIAGNOSIS — E785 Hyperlipidemia, unspecified: Secondary | ICD-10-CM | POA: Diagnosis not present

## 2019-08-07 DIAGNOSIS — R000 Tachycardia, unspecified: Secondary | ICD-10-CM | POA: Insufficient documentation

## 2019-08-07 DIAGNOSIS — E860 Dehydration: Secondary | ICD-10-CM | POA: Diagnosis not present

## 2019-08-07 LAB — COMPREHENSIVE METABOLIC PANEL
ALT: 50 U/L — ABNORMAL HIGH (ref 0–44)
AST: 46 U/L — ABNORMAL HIGH (ref 15–41)
Albumin: 3.9 g/dL (ref 3.5–5.0)
Alkaline Phosphatase: 143 U/L — ABNORMAL HIGH (ref 38–126)
Anion gap: 13 (ref 5–15)
BUN: 15 mg/dL (ref 8–23)
CO2: 25 mmol/L (ref 22–32)
Calcium: 9.7 mg/dL (ref 8.9–10.3)
Chloride: 104 mmol/L (ref 98–111)
Creatinine, Ser: 0.97 mg/dL (ref 0.44–1.00)
GFR calc Af Amer: >60 mL/min (ref >60)
GFR calc non Af Amer: >60 mL/min (ref >60)
Glucose, Bld: 143 mg/dL — ABNORMAL HIGH (ref 70–99)
Potassium: 3.7 mmol/L (ref 3.5–5.1)
Sodium: 142 mmol/L (ref 135–145)
Total Bilirubin: 0.5 mg/dL (ref 0.3–1.2)
Total Protein: 8.3 g/dL — ABNORMAL HIGH (ref 6.5–8.1)

## 2019-08-07 LAB — CBC
HCT: 52.3 % — ABNORMAL HIGH (ref 36.0–46.0)
Hemoglobin: 16.7 g/dL — ABNORMAL HIGH (ref 12.0–15.0)
MCH: 29.7 pg (ref 26.0–34.0)
MCHC: 31.9 g/dL (ref 30.0–36.0)
MCV: 92.9 fL (ref 80.0–100.0)
Platelets: 407 10*3/uL — ABNORMAL HIGH (ref 150–400)
RBC: 5.63 MIL/uL — ABNORMAL HIGH (ref 3.87–5.11)
RDW: 15.5 % (ref 11.5–15.5)
WBC: 14.1 10*3/uL — ABNORMAL HIGH (ref 4.0–10.5)
nRBC: 0 % (ref 0.0–0.2)

## 2019-08-07 LAB — NOVEL CORONAVIRUS, NAA: SARS-CoV-2, NAA: NOT DETECTED

## 2019-08-07 LAB — TROPONIN I (HIGH SENSITIVITY)
Troponin I (High Sensitivity): 6 ng/L (ref <18)
Troponin I (High Sensitivity): 6 ng/L (ref <18)

## 2019-08-07 LAB — URINALYSIS, ROUTINE W REFLEX MICROSCOPIC
Bacteria, UA: NONE SEEN
Bilirubin Urine: NEGATIVE
Glucose, UA: NEGATIVE mg/dL
Hgb urine dipstick: NEGATIVE
Ketones, ur: 5 mg/dL — AB
Nitrite: NEGATIVE
Protein, ur: NEGATIVE mg/dL
Specific Gravity, Urine: 1.02 (ref 1.005–1.030)
pH: 5 (ref 5.0–8.0)

## 2019-08-07 LAB — D-DIMER, QUANTITATIVE: D-Dimer, Quant: 0.4 ug/mL-FEU (ref 0.00–0.50)

## 2019-08-07 LAB — LIPASE, BLOOD: Lipase: 34 U/L (ref 11–51)

## 2019-08-07 MED ORDER — SODIUM CHLORIDE 0.9 % IV SOLN
Freq: Once | INTRAVENOUS | Status: AC
  Administered 2019-08-07: 17:00:00 via INTRAVENOUS

## 2019-08-07 MED ORDER — SODIUM CHLORIDE 0.9% FLUSH
3.0000 mL | Freq: Once | INTRAVENOUS | Status: AC
  Administered 2019-08-07: 3 mL via INTRAVENOUS

## 2019-08-07 MED ORDER — SODIUM CHLORIDE 0.9 % IV BOLUS
1000.0000 mL | Freq: Once | INTRAVENOUS | Status: AC
  Administered 2019-08-07: 1000 mL via INTRAVENOUS

## 2019-08-07 NOTE — Telephone Encounter (Signed)
Pt called and states that today she is dizzy and light headed. Her pulse today is 126. Dr. Tomi Bamberger was called and she informed me to recommend pt go to ER. Pt was called back and message was relayed. Pt states she will go.

## 2019-08-07 NOTE — ED Triage Notes (Signed)
Dizzy and felt like her pulse was elevated since last Sunday , c/o sore throat and h/a,  Also n/v/d had a tele visit on wed  No results  Yet dr felt like she might be dehydrated

## 2019-08-11 NOTE — ED Provider Notes (Signed)
[facility] [place] EMERGENCY DEPARTMENT Provider Note   CSN: [id number] Arrival date & time: 08/07/19  1201     History   Chief Complaint Chief Complaint  Patient presents with  . Tachycardia  . Dizziness  . Abdominal Pain    HPI [name] is a 63 y.o. female.     HPI   63 year old female with palpitations.  Heart rate has been elevated for the past several days.  Mild sore throat and a mild headache.  Had a tele-visit earlier in the week and was advised to stay well-hydrated keep an eye on her symptoms.  They persisted so she came to the ER today.  Has any acute chest pain.  No dyspnea.  Past Medical History:  Diagnosis Date  . Anemia    previously followed by Dr. Jamse Arn for anemia and elevated platelets  . Anxiety   . C. difficile colitis 10/01/12   treated by [facility]  . Chronic fatigue syndrome   . DDD (degenerative disc disease), lumbar 08/19/14   and facet arthroplasty & left lumbar radiculopathy (Dr.Ramos)  . Depression   . Dyssynergia    dyssynergenic defecation, contributing to fecal incontinence.  . Edema   . Fibromyalgia   . Gastroparesis    followed at [facility]  . GERD (gastroesophageal reflux disease)   . Hyperlipidemia   . Kidney stone   . Lumbar radiculopathy   . Migraine   . Neuropathy   . Obstructive sleep apnea    Does  wear  CPAP  . Paresthesia    Dr. Everette Rank at [facility]  . Pelvic floor dysfunction    pelvic floor dyssynergy  . Plantar fasciitis 02/2011   R foot  . Pneumonia    2012  . PONV (postoperative nausea and vomiting)    pt states has gastroparesis has difficulty taking antibiotics and narcotics has severe nausea and vomiting   . Restless leg syndrome   . S/P endometrial ablation 08/09/2006   Novasure Ablation  . S/P epidural steroid injection 09/20/14   Dr.Ramos  . Tremor    Dr. Everette Rank  . Urinary frequency   . Urinary incontinence     Patient Active Problem List   Diagnosis Date Noted  . Dyspnea  12/26/2017  . Restrictive lung disease secondary to obesity 12/26/2017  . Atypical chest pain 12/06/2017  . Sinus tachycardia 12/06/2017  . Chest pain 12/06/2017  . DJD (degenerative joint disease), cervical 12/24/2016  . Primary osteoarthritis of both hips 12/24/2016  . Primary osteoarthritis of both knees 12/24/2016  . H/O total knee replacement, right 12/24/2016  . Spondylosis of lumbar region without myelopathy or radiculopathy 12/24/2016  . Acute gout 05/30/2016  . Myalgia 03/29/2016  . Other long term (current) drug therapy 03/29/2016  . Idiopathic peripheral neuropathy 03/29/2016  . Cannot sleep 03/29/2016  . Migraine without aura and responsive to treatment 03/29/2016  . Multifocal myoclonus 03/29/2016  . Restless leg 03/29/2016  . Has a tremor 03/29/2016  . History of aspiration pneumonitis 01/25/2016  . History of acute bronchitis 01/25/2016  . LPRD (laryngopharyngeal reflux disease) 01/25/2016  . Imbalance 01/09/2016  . Serotonin syndrome 12/22/2015  . OA (osteoarthritis) of knee 09/05/2015  . Obesity 05/17/2015  . OSA (obstructive sleep apnea) 03/16/2013  . Iron deficiency 11/06/2012  . Thrombocythemia ([place]) 11/06/2012  . Leukocytosis 11/05/2012  . Impaired fasting glucose 07/23/2012  . Bronchitis 04/03/2012  . Kidney stone on left side 03/05/2012  . Kidney cysts 03/04/2012  . Loose stools  03/04/2012  . Urinary frequency 12/04/2011  . Restless leg syndrome 12/04/2011  . Polypharmacy 12/04/2011  . Bladder incontinence 12/04/2011  . Fibromyalgia   . Hyperlipidemia   . Edema   . S/P endometrial ablation   . Allergic rhinitis 09/18/2011  . Depression, major, single episode, in partial remission ([place]) 04/21/2011  . PRECORDIAL PAIN 02/17/2010  . Anxiety state 01/30/2010  . Migraine 01/30/2010  . GERD 01/30/2010  . Gastroparesis 01/30/2010    Past Surgical History:  Procedure Laterality Date  . CHOLECYSTECTOMY  9/05  . ENDOMETRIAL ABLATION  08/09/2006    Dr. Valentina Shaggy Ablation  . FACET JOINT INJECTION  04/17/2017   Left L4-5 and L5-S1  . KNEE ARTHROPLASTY    . KNEE SURGERY  1999   R knee, Dr. Eddie Dibbles, torn cartilage  . RETINAL LASER PROCEDURE Right 08/28/2018   laser retinopexy  . RIGHT/LEFT HEART CATH AND CORONARY ANGIOGRAPHY N/A 01/01/2018   Procedure: RIGHT/LEFT HEART CATH AND CORONARY ANGIOGRAPHY;  Surgeon: Sherren Mocha, MD;  Location: [facility];  Service: Cardiovascular;  Laterality: N/A;  . TONSILLECTOMY  1968  . TONSILLECTOMY    . TOTAL KNEE ARTHROPLASTY Right 09/05/2015   Procedure: RIGHT TOTAL KNEE ARTHROPLASTY;  Surgeon: Gaynelle Arabian, MD;  Location: [facility];  Service: Orthopedics;  Laterality: Right;  . TOTAL KNEE ARTHROPLASTY Left 07/14/2018   Procedure: LEFT TOTAL KNEE ARTHROPLASTY;  Surgeon: Gaynelle Arabian, MD;  Location: [facility];  Service: Orthopedics;  Laterality: Left;  . ULTRASOUND GUIDANCE FOR VASCULAR ACCESS  01/01/2018   Procedure: Ultrasound Guidance For Vascular Access;  Surgeon: Sherren Mocha, MD;  Location: [facility];  Service: Cardiovascular;;     OB History    Gravida  1   Para  1   Term  1   Preterm      AB      Living  1     SAB      TAB      Ectopic      Multiple      Live Births               Home Medications    Prior to Admission medications   Medication Sig Start Date End Date Taking? Authorizing Provider  acetaminophen (TYLENOL) 500 MG tablet Take 1,000 mg by mouth 3 (three) times daily as needed for moderate pain.    [provider]  albuterol (PROVENTIL HFA;VENTOLIN HFA) 108 (90 Base) MCG/ACT inhaler Inhale 1-2 puffs into the lungs every 6 (six) hours as needed for wheezing or shortness of breath. Patient not taking: Reported on 08/05/2019 07/23/18   Lacretia Leigh, MD  allopurinol (ZYLOPRIM) 300 MG tablet TAKE 1 TABLET (300 MG TOTAL) DAILY BY MOUTH. 06/04/19   Rita Ohara, MD  ALPRAZolam Duanne Moron) 0.5 MG tablet Take 0.5 mg by mouth 3 (three) times  daily as needed for anxiety. 07/09/18   [provider]  ARIPiprazole (ABILIFY) 5 MG tablet Take 5 mg by mouth daily. 04/23/17   [provider]  Armodafinil 250 MG tablet TAKE 1 TABLET EVERY DAY WITH BREAKFAST 06/08/19   Bo Merino, MD  aspirin 81 MG tablet aspirin 81 mg tablet,delayed release  TAKE 1 TABLET BY MOUTH EVERY DAY    [provider]  atorvastatin (LIPITOR) 40 MG tablet TAKE 1 TABLET BY MOUTH EVERY DAY 03/26/19   Rita Ohara, MD  cetirizine (ZYRTEC) 10 MG tablet Take 10 mg by mouth at bedtime.    [provider]  cholecalciferol (VITAMIN  D) 1000 units tablet Take 1,000 Units by mouth daily.    [provider]  colchicine 0.6 MG tablet TAKE 1 TABLET BY MOUTH DAILY AS NEEDED DURING GOUT FLARES. Patient not taking: Reported on 08/05/2019 07/31/19   Bo Merino, MD  DULoxetine (CYMBALTA) 60 MG capsule Take 60 mg by mouth 2 (two) times daily.  05/15/16   [provider]  famotidine (PEPCID) 40 MG tablet Take 40 mg by mouth 2 (two) times daily.    [provider]  fluticasone (FLONASE) 50 MCG/ACT nasal spray PLACE 2 SPRAYS INTO BOTH NOSTRILS DAILY AS NEEDED FOR ALLERGIES 05/16/18   Rita Ohara, MD  gabapentin (NEURONTIN) 300 MG capsule Take 1 capsule (300 mg total) by mouth 3 (three) times daily. Gabapentin 300 mg Protocol Take a 300 mg capsule three times a day for two weeks following surgery. Then take a 300 mg capsule two times a day for two weeks.  Then resume a 300 mg capsule once a day at bedtime. 07/16/18   Edmisten, Kristie L, PA  guaiFENesin (MUCINEX) 600 MG 12 hr tablet Take 600 mg by mouth 2 (two) times daily.    [provider]  meclizine (ANTIVERT) 25 MG tablet Take 1 tablet (25 mg total) by mouth 3 (three) times daily as needed for dizziness. Patient not taking: Reported on 08/05/2019 07/22/19   Rita Ohara, MD  Melatonin 10 MG TABS Take 10 mg by mouth at bedtime.    [provider]  methocarbamol  (ROBAXIN) 500 MG tablet TAKE 1 TABLET (500 MG TOTAL) BY MOUTH 2 (TWO) TIMES DAILY AS NEEDED FOR MUSCLE SPASMS. Patient not taking: Reported on 08/05/2019 08/04/19   Bo Merino, MD  metoprolol tartrate (LOPRESSOR) 50 MG tablet Take 1 tablet (50 mg total) by mouth 2 (two) times daily. 04/10/19 08/05/19  Rita Ohara, MD  Multiple Vitamins-Minerals (ICAPS AREDS 2) CAPS Take 1 capsule by mouth 2 (two) times daily.    [provider]  nortriptyline (PAMELOR) 25 MG capsule Take 75 mg by mouth at bedtime.     [provider]  Omega-3 Fatty Acids (FISH OIL) 1200 MG CAPS Take 1,200 mg by mouth daily.    [provider]  ondansetron (ZOFRAN) 8 MG tablet Take 8 mg by mouth every 8 (eight) hours as needed for nausea.  07/16/18   [provider]  pantoprazole (PROTONIX) 40 MG tablet Take 40 mg by mouth 2 (two) times daily.    [provider]  Probiotic Product ([place]) CAPS Take 1 capsule by mouth at bedtime.     [provider]  prochlorperazine (COMPAZINE) 10 MG tablet Take 10 mg by mouth every 6 (six) hours as needed for nausea or vomiting.     [provider]  Propylene Glycol (SYSTANE COMPLETE) 0.6 % SOLN Place 1 drop into both eyes 2 (two) times daily.    [provider]  topiramate (TOPAMAX) 25 MG tablet TAKE 2 TABLETS (50 MG TOTAL) BY MOUTH AT BEDTIME 06/01/19   Bo Merino, MD  vitamin B-12 (CYANOCOBALAMIN) 1000 MCG tablet Take 1,000 mcg by mouth daily.    [provider]  XIIDRA 5 % SOLN Apply 1 drop to eye 2 (two) times daily. 12/11/18   [provider]  carbamazepine (TEGRETOL XR) 100 MG 12 hr tablet Take 200 mg by mouth daily.    02/06/12  [provider]  Eszopiclone 3 MG TABS Take 3 mg by mouth at bedtime. Take immediately before bedtime  02/06/12  [provider]    Family History Family History  Problem Relation Age of Onset  . Allergies Mother   . Hypertension Mother    . Heart disease Mother        possible valve problem - leaking valve  . Macular degeneration Mother   . Heart disease Father        pacemaker, CHF  . Hypertension Father   . Diabetes Father        borderline  . Stroke Father 56  . Kidney disease Father   . Asthma Sister   . Irritable bowel syndrome Sister   . Allergies Sister   . Heart disease Paternal Grandmother   . Heart disease Paternal Grandfather   . Cancer Maternal Aunt        leukemia  . Cancer Maternal Aunt   . Colon cancer Maternal Aunt        late 81's  . CAD Neg Hx     Social History Social History   Tobacco Use  . Smoking status: Never Smoker  . Smokeless tobacco: Never Used  Substance Use Topics  . Alcohol use: No    Alcohol/week: 0.0 standard drinks  . Drug use: No     Allergies   Erythromycin, Tramadol, and Dilaudid [hydromorphone hcl]   Review of Systems Review of Systems   Physical Exam Updated Vital Signs BP 107/64   Pulse 98   Temp 98.2 F (36.8 C) (Oral)   Resp 18   Ht 5\' 2"  (1.575 m)   Wt 102.5 kg   LMP 07/27/2006   SpO2 98%   BMI 41.34 kg/m   Physical Exam Vitals signs and nursing note reviewed.  Constitutional:      General: She is not in acute distress.    Appearance: She is well-developed. She is obese.  HENT:     Head: Normocephalic and atraumatic.  Eyes:     General:        Right eye: No discharge.        Left eye: No discharge.     Conjunctiva/sclera: Conjunctivae normal.  Neck:     Musculoskeletal: Neck supple.  Cardiovascular:     Rate and Rhythm: Regular rhythm. Tachycardia present.     Heart sounds: Normal heart sounds. No murmur. No friction rub. No gallop.   Pulmonary:     Effort: Pulmonary effort is normal. No respiratory distress.     Breath sounds: Normal breath sounds.  Abdominal:     General: There is no distension.     Palpations: Abdomen is soft.     Tenderness: There is no abdominal tenderness.  Musculoskeletal:        General: No  tenderness.  Skin:    General: Skin is warm and dry.  Neurological:     Mental Status: She is alert.  Psychiatric:        Behavior: Behavior normal.        Thought Content: Thought content normal.      ED Treatments / Results  Labs (all labs ordered are listed, but only abnormal results are displayed) Labs Reviewed  COMPREHENSIVE METABOLIC PANEL - Abnormal; Notable for the following components:      Result Value   Glucose, Bld 143 (*)    Total Protein 8.3 (*)    AST 46 (*)    ALT 50 (*)    Alkaline Phosphatase 143 (*)    All other components within normal limits  CBC - Abnormal; Notable for the following  components:   WBC 14.1 (*)    RBC 5.63 (*)    Hemoglobin 16.7 (*)    HCT 52.3 (*)    Platelets 407 (*)    All other components within normal limits  URINALYSIS, ROUTINE W REFLEX MICROSCOPIC - Abnormal; Notable for the following components:   Ketones, ur 5 (*)    Leukocytes,Ua TRACE (*)    All other components within normal limits  LIPASE, BLOOD  D-DIMER, QUANTITATIVE (NOT AT [facility])  TROPONIN I (HIGH SENSITIVITY)  TROPONIN I (HIGH SENSITIVITY)    EKG EKG Interpretation  Date/Time:  Friday August 07 2019 12:16:41 EDT Ventricular Rate:  129 PR Interval:  140 QRS Duration: 90 QT Interval:  310 QTC Calculation: [id number] R Axis:   -150 Text Interpretation:  Sinus tachycardia Right superior axis deviation Septal infarct , age undetermined Abnormal ECG Confirmed by Virgel Manifold [phone]) on 08/07/2019 4:18:09 PM   Radiology No results found.  Procedures Procedures (including critical care time)  Medications Ordered in ED Medications  sodium chloride flush (NS) 0.9 % injection 3 mL (3 mLs Intravenous Given 08/07/19 1711)  0.9 %  sodium chloride infusion ( Intravenous Stopped 08/07/19 2046)  sodium chloride 0.9 % bolus 1,000 mL (0 mLs Intravenous Stopped 08/07/19 2046)     Initial Impression / Assessment and Plan / ED Course  I have reviewed the triage vital signs  and the nursing notes.  Pertinent labs & imaging results that were available during my care of the patient were reviewed by me and considered in my medical decision making (see chart for details).        63 year old female with sinus tachycardia.  May be some degree of mild dehydration.  Improved with IV fluids.  Symptoms improved as well.  Labs fairly unremarkable with regards to possible alternative etiology.  At this point I think she is fine for discharge and outpatient follow-up.  Advised to continue to stay well-hydrated.  Emergent return precautions were discussed.  Final Clinical Impressions(s) / ED Diagnoses   Final diagnoses:  Sinus tachycardia  Dehydration    ED Discharge Orders    None       Virgel Manifold, MD 08/11/19 [phone]

## 2019-08-12 ENCOUNTER — Telehealth: Payer: Self-pay | Admitting: Family Medicine

## 2019-08-12 MED ORDER — AMOXICILLIN 500 MG PO TABS: 1000.0000 mg | ORAL_TABLET | Freq: Two times a day (BID) | ORAL | 0 refills | Status: DC

## 2019-08-12 NOTE — Telephone Encounter (Signed)
Patient advised.

## 2019-08-12 NOTE — Telephone Encounter (Signed)
Please advise pt I sent in a prescription for amoxil.  She should f/u if symptoms persist or worsen.  I hope the diarrhea has stopped and that she is no longer dizzy, pulse is normal.

## 2019-08-12 NOTE — Telephone Encounter (Signed)
Pt called and states that she believes that she now has a sinus infection. She states when she blows her nose the mucus is green. She also states that she is having a frontal headache. Pt uses [facility] and can be reached at [phone].

## 2019-08-19 ENCOUNTER — Other Ambulatory Visit: Payer: Self-pay

## 2019-08-19 DIAGNOSIS — R6889 Other general symptoms and signs: Secondary | ICD-10-CM | POA: Diagnosis not present

## 2019-08-19 DIAGNOSIS — Z20822 Contact with and (suspected) exposure to covid-19: Secondary | ICD-10-CM

## 2019-08-21 LAB — NOVEL CORONAVIRUS, NAA: SARS-CoV-2, NAA: NOT DETECTED

## 2019-08-24 ENCOUNTER — Ambulatory Visit: Payer: Medicare HMO

## 2019-08-24 ENCOUNTER — Telehealth: Payer: Self-pay | Admitting: Family Medicine

## 2019-08-24 NOTE — Telephone Encounter (Signed)
Pt was notified and will switch to either mucinex DM or Robtussion DM and will try this a couple days and if not better call back for an appt

## 2019-08-24 NOTE — Telephone Encounter (Signed)
Chart reviewed.  Last visit with me was 9/9.  We sent in ABX for her on 9/16.  Looks like she had another negative COVID test on 9/23.  Delsym is just a cough suppressant, not treating any underlying cause (whether it be from postnasal drainage, which could be from allergies or ongoing sinus infection, vs if from reactive airways/wheezing, etc).  Needs virtual visit (vs in office, since she just had another negative COVID test) to help determine what treatment might work best.  Only thing I can offer based on the info provided is taking Mucinex (guaifenesin)--acts as an expectorant to loosen up mucus if it is thick.  If taking plain Mucinex, can continue Delsym, versus changing to Mucinex DM (or Robitussin DM)--if taking anything with DM (containing dextromethorphan), then she should STOP the delsym, or will be getting duplication of that ingredient which could be dangerous.

## 2019-08-24 NOTE — Telephone Encounter (Signed)
Pt called and states that is still having a horrible cough states she is still taking delsym but it is not helping, she is wanting to know if there is something else she can take, pt can be reached at [phone] and [id number] [place],  - [address]

## 2019-08-25 ENCOUNTER — Other Ambulatory Visit: Payer: Self-pay | Admitting: Rheumatology

## 2019-08-25 ENCOUNTER — Encounter: Payer: Self-pay | Admitting: Gynecology

## 2019-08-25 NOTE — Telephone Encounter (Signed)
Last Visit: 07/08/19 Next Visit: due February 2021. Message sent to the front to schedule.   Okay to refill per Dr. Estanislado Pandy

## 2019-08-25 NOTE — Telephone Encounter (Signed)
Please schedule patient a follow up visit. Patient due February 2021. Thanks!

## 2019-08-28 ENCOUNTER — Other Ambulatory Visit: Payer: Self-pay | Admitting: Family Medicine

## 2019-08-28 NOTE — Telephone Encounter (Signed)
Is this okay to refill? 

## 2019-08-30 ENCOUNTER — Other Ambulatory Visit: Payer: Self-pay | Admitting: Rheumatology

## 2019-08-31 ENCOUNTER — Other Ambulatory Visit: Payer: Self-pay | Admitting: Family Medicine

## 2019-08-31 DIAGNOSIS — G4700 Insomnia, unspecified: Secondary | ICD-10-CM | POA: Diagnosis not present

## 2019-08-31 DIAGNOSIS — R519 Headache, unspecified: Secondary | ICD-10-CM | POA: Diagnosis not present

## 2019-08-31 DIAGNOSIS — G2581 Restless legs syndrome: Secondary | ICD-10-CM | POA: Diagnosis not present

## 2019-08-31 DIAGNOSIS — R251 Tremor, unspecified: Secondary | ICD-10-CM | POA: Diagnosis not present

## 2019-08-31 DIAGNOSIS — E785 Hyperlipidemia, unspecified: Secondary | ICD-10-CM

## 2019-08-31 DIAGNOSIS — M797 Fibromyalgia: Secondary | ICD-10-CM | POA: Diagnosis not present

## 2019-08-31 DIAGNOSIS — R202 Paresthesia of skin: Secondary | ICD-10-CM | POA: Diagnosis not present

## 2019-08-31 DIAGNOSIS — G609 Hereditary and idiopathic neuropathy, unspecified: Secondary | ICD-10-CM | POA: Diagnosis not present

## 2019-08-31 NOTE — Telephone Encounter (Signed)
Last Visit: 07/08/19 Next Visit: 01/05/20 Labs: 08/07/19 WBC 14.1 RBC 5.63 Hgb 16.7 Hct 52.3 Platelets 407 Glucose 143 Total Protein 8.3   Okay to refill per Dr. Estanislado Pandy

## 2019-09-03 DIAGNOSIS — Z471 Aftercare following joint replacement surgery: Secondary | ICD-10-CM | POA: Diagnosis not present

## 2019-09-03 DIAGNOSIS — Z96652 Presence of left artificial knee joint: Secondary | ICD-10-CM | POA: Diagnosis not present

## 2019-09-07 ENCOUNTER — Ambulatory Visit: Payer: Medicare HMO

## 2019-09-09 ENCOUNTER — Other Ambulatory Visit: Payer: Self-pay

## 2019-09-10 ENCOUNTER — Ambulatory Visit (INDEPENDENT_AMBULATORY_CARE_PROVIDER_SITE_OTHER): Payer: Medicare HMO

## 2019-09-10 ENCOUNTER — Ambulatory Visit: Payer: Medicare HMO | Admitting: Gynecology

## 2019-09-10 ENCOUNTER — Encounter: Payer: Self-pay | Admitting: Gynecology

## 2019-09-10 VITALS — BP 124/78

## 2019-09-10 DIAGNOSIS — N854 Malposition of uterus: Secondary | ICD-10-CM | POA: Diagnosis not present

## 2019-09-10 DIAGNOSIS — R1032 Left lower quadrant pain: Secondary | ICD-10-CM

## 2019-09-10 NOTE — Patient Instructions (Signed)
Follow-up with your primary provider if your abdominal pain returns.

## 2019-09-10 NOTE — Progress Notes (Signed)
    [name] February 02, 1956 [id number]        63 y.o.  [name] presents for ultrasound.  Evaluated 07/17/2019 for left lower quadrant discomfort.  Ordered ultrasound to rule out ovarian process/pelvic surveillance noting limits of pelvic exam.  Patient notes that her pain has resolved for several weeks.  Reports having seen GI and they did not think it was of GI etiology.  Had urine analysis at her August office visit which was normal.  Past medical history,surgical history, problem list, medications, allergies, family history and social history were all reviewed and documented in the EPIC chart.  Directed ROS with pertinent positives and negatives documented in the history of present illness/assessment and plan.  Exam: Vitals:   09/10/19 1153  BP: 124/78   General appearance:  Normal  Ultrasound transvaginal shows uterus small anteverted with no myometrial abnormalities.  Endometrial echo 1.5 mm.  Right and left ovaries visualized small and atrophic.  Cul-de-sac negative  Assessment/Plan:  63 y.o. [name] with history of left lower quadrant pain now resolved.  Ultrasound is negative for GYN pathology.  Patient will monitor for now and if her pain returns she will follow-up with her primary provider for evaluation.    [name], 12:05 PM 09/10/2019

## 2019-09-11 ENCOUNTER — Other Ambulatory Visit: Payer: Self-pay | Admitting: *Deleted

## 2019-09-11 ENCOUNTER — Telehealth: Payer: Self-pay | Admitting: Rheumatology

## 2019-09-11 MED ORDER — ARMODAFINIL 250 MG PO TABS: ORAL_TABLET | ORAL | 2 refills | Status: DC

## 2019-09-11 NOTE — Telephone Encounter (Signed)
Refill request received via fax  Last Visit: 07/08/19 Next Visit: 01/05/20  Okay to refill Armodafinil?

## 2019-09-11 NOTE — Telephone Encounter (Signed)
Patient left a voicemail stating she received a call from the pharmacy stating they have been trying to contact the office for prescription refill of Armodafinil.  Patient states she will be out of medication this weekend.

## 2019-09-11 NOTE — Telephone Encounter (Signed)
ok 

## 2019-09-11 NOTE — Telephone Encounter (Signed)
Patient advised prescription was faxed to the pharmacy to this morning.

## 2019-09-13 DIAGNOSIS — R69 Illness, unspecified: Secondary | ICD-10-CM | POA: Diagnosis not present

## 2019-09-14 ENCOUNTER — Other Ambulatory Visit: Payer: Self-pay

## 2019-09-14 ENCOUNTER — Encounter: Payer: Medicare HMO | Attending: Surgery | Admitting: Dietician

## 2019-09-14 ENCOUNTER — Encounter: Payer: Self-pay | Admitting: Dietician

## 2019-09-14 DIAGNOSIS — Z713 Dietary counseling and surveillance: Secondary | ICD-10-CM | POA: Insufficient documentation

## 2019-09-14 DIAGNOSIS — Z6841 Body Mass Index (BMI) 40.0 and over, adult: Secondary | ICD-10-CM | POA: Insufficient documentation

## 2019-09-14 DIAGNOSIS — E669 Obesity, unspecified: Secondary | ICD-10-CM | POA: Insufficient documentation

## 2019-09-14 NOTE — Progress Notes (Signed)
Pre-Operative Nutrition Class   Appt Start Time: 8:15am     End Time: 9:30am   Patient was seen on 09/14/2019 for Pre-Operative Bariatric Surgery Education at [facility].    Surgery date: ? Surgery type: RYGB   Start weight at [facility]: 225.8 lbs (date: 05/06/2018) Weight today: 230 lbs BMI: 42.1 kg/m2   Samples Given per MNT Protocol (pt educated on appropriate usage) Multivitamin: Bariatric Advantage Lot #[id number] Exp: 11/2020   Calcium: Bariatric Fusion  Protein Drink: Protein2O Lot #[id number] CCP 0230  Exp: 01/12/2021   The following the learning objectives were met by the patient during this course:  Identify Pre-Op Dietary Goals and will begin 2 weeks pre-operatively  Identify appropriate sources of fluids and proteins   State protein recommendations and appropriate sources pre and post-operatively  Identify Post-Operative Dietary Goals and will follow for 2 weeks post-operatively  Identify appropriate multivitamin and calcium sources  Describe the need for physical activity post-operatively and will follow MD recommendations  State when to call healthcare provider regarding medication questions or post-operative complications   Handouts given include:  Pre-Op Bariatric Surgery Diet Handout  Protein Shake Handout  Post-Op Bariatric Surgery Nutrition Handout  BELT Program Information Flyer  Support Group Information Flyer  [facility] Bariatric Supplements Price List   Follow-Up Plan: Patient will follow-up at [facility] 2 weeks post operatively for diet advancement per MD.

## 2019-09-15 ENCOUNTER — Encounter: Payer: Self-pay | Admitting: Gynecology

## 2019-09-15 ENCOUNTER — Ambulatory Visit (INDEPENDENT_AMBULATORY_CARE_PROVIDER_SITE_OTHER): Payer: Medicare HMO | Admitting: Gynecology

## 2019-09-15 VITALS — BP 124/84 | Ht 62.5 in | Wt 230.0 lb

## 2019-09-15 DIAGNOSIS — N952 Postmenopausal atrophic vaginitis: Secondary | ICD-10-CM | POA: Diagnosis not present

## 2019-09-15 DIAGNOSIS — Z01419 Encounter for gynecological examination (general) (routine) without abnormal findings: Secondary | ICD-10-CM | POA: Diagnosis not present

## 2019-09-15 DIAGNOSIS — N764 Abscess of vulva: Secondary | ICD-10-CM | POA: Diagnosis not present

## 2019-09-15 NOTE — Progress Notes (Signed)
    [name] [birth year] [id number]        63 y.o.  [name] for annual gynecologic exam.  Recently evaluated for pain which has subsequently resolved.  Had ultrasound which was negative.  Notes sore area left of the vaginal opening started 1 to 2 weeks ago.  Past medical history,surgical history, problem list, medications, allergies, family history and social history were all reviewed and documented as reviewed in the EPIC chart.  ROS:  Performed with pertinent positives and negatives included in the history, assessment and plan.   Additional significant findings : None   Exam: [name] assistant Vitals:   09/15/19 1102  BP: 124/84  Weight: 230 lb (104.3 kg)  Height: 5' 2.5" (1.588 m)   Body mass index is 41.4 kg/m.  General appearance:  Normal affect, orientation and appearance. Skin: Grossly normal HEENT: Without gross lesions.  No cervical or supraclavicular adenopathy. Thyroid normal.  Lungs:  Clear without wheezing, rales or rhonchi Cardiac: RR, without RMG Abdominal:  Soft, nontender, without masses, guarding, rebound, organomegaly or hernia Breasts:  Examined lying and sitting without masses, retractions, discharge or axillary adenopathy. Pelvic:  Ext, BUS, Vagina: With atrophic changes.  Small boil left lower labia majora  Cervix: Normal with atrophic changes  Uterus: Difficult to palpate but no gross masses or tenderness  Adnexa: Without masses or tenderness    Anus and perineum: Normal   Rectovaginal: Normal sphincter tone without palpated masses or tenderness.    Assessment/Plan:  63 y.o. G67P1001 female for annual gynecologic exam.   1. Postmenopausal.  No significant menopausal symptoms or any vaginal bleeding. 2. Small boil left labia majora.  Looks like it is beginning to point.  Sits baths and warm compresses recommended.  She will follow-up if it persists. 3. Mammography scheduled next week.  Breast exam normal today. 4. DEXA 2018 normal.  Plan repeat  DEXA at age 34. 18. Colonoscopy 2020.  Repeat at their recommended interval. 6. Pap smear 2019.  No Pap smear done today.  No history of abnormal Pap smears previously.  Plan repeat Pap smear at 3-year interval per current screening guidelines. 7. Health maintenance.  Patient has bariatric surgery scheduled November 3.  No routine lab work done today as this is done elsewhere.  Follow-up 1 year, sooner as needed.   Leah Auerbach MD, 11:24 AM 09/15/2019

## 2019-09-15 NOTE — Patient Instructions (Signed)
Follow-up if the sore area outside the vagina persists.  Follow-up in 1 year for annual exam

## 2019-09-17 DIAGNOSIS — R194 Change in bowel habit: Secondary | ICD-10-CM | POA: Diagnosis not present

## 2019-09-17 DIAGNOSIS — K3184 Gastroparesis: Secondary | ICD-10-CM | POA: Diagnosis not present

## 2019-09-17 DIAGNOSIS — K219 Gastro-esophageal reflux disease without esophagitis: Secondary | ICD-10-CM | POA: Diagnosis not present

## 2019-09-17 DIAGNOSIS — Z79899 Other long term (current) drug therapy: Secondary | ICD-10-CM | POA: Diagnosis not present

## 2019-09-17 DIAGNOSIS — R112 Nausea with vomiting, unspecified: Secondary | ICD-10-CM | POA: Diagnosis not present

## 2019-09-22 ENCOUNTER — Ambulatory Visit
Admission: RE | Admit: 2019-09-22 | Discharge: 2019-09-22 | Disposition: A | Discharge status: Home / Self Care | Payer: Medicare HMO | Source: Ambulatory Visit | Attending: Family Medicine | Admitting: Family Medicine

## 2019-09-22 ENCOUNTER — Other Ambulatory Visit: Payer: Self-pay

## 2019-09-22 DIAGNOSIS — Z1231 Encounter for screening mammogram for malignant neoplasm of breast: Secondary | ICD-10-CM | POA: Diagnosis not present

## 2019-09-24 ENCOUNTER — Encounter (HOSPITAL_COMMUNITY): Admission: RE | Admit: 2019-09-24 | Payer: Medicare HMO | Source: Ambulatory Visit

## 2019-09-24 NOTE — Progress Notes (Addendum)
PCP - Rita Ohara MD  Cardiologist - Jenkins Rouge MD  Chest x-ray -  EKG - 08/07/19 Stress Test -  ECHO - 12/07/2017 Cardiac Cath -   Sleep Study -  CPAP -   Fasting Blood Sugar -  Checks Blood Sugar _____ times a day  Blood Thinner Instructions: Aspirin Instructions: 81mg  Last Dose: 09/19/2019  Anesthesia review: Chart to sent to Konrad Felix PA for review. Please see BMP. BUN is 26  Patient denies shortness of breath, fever, cough and chest pain at PAT appointment   Patient verbalized understanding of instructions that were given to them at the PAT appointment. Patient was also instructed that they will need to review over the PAT instructions again at home before surgery.

## 2019-09-24 NOTE — Progress Notes (Signed)
Pt is scheduled for her surgery on 09-29-19. PAT appt is 10-30.20. Please place surgery orders.

## 2019-09-24 NOTE — Patient Instructions (Addendum)
DUE TO COVID-19 ONLY ONE VISITOR IS ALLOWED TO COME WITH YOU AND STAY IN THE WAITING ROOM ONLY DURING PRE OP AND PROCEDURE DAY OF SURGERY. THE 1 VISITOR MAY VISIT WITH YOU AFTER SURGERY IN YOUR PRIVATE ROOM DURING VISITING HOURS ONLY!  YOU NEED TO HAVE A COVID 19 TEST ON_Friday 10/30/2020______ @_12 :10pm______, THIS TEST MUST BE DONE BEFORE SURGERY, COME  [address] [place] , [id number].  ([place]) ONCE YOUR COVID TEST IS COMPLETED, PLEASE BEGIN THE QUARANTINE INSTRUCTIONS AS OUTLINED IN YOUR HANDOUT.                [name]   Your procedure is scheduled on: Tuesday 09/29/2019    Report to [facility] Main  Entrance    Report to Short Stay at 5:30AM     Call this number if you have problems the morning of surgery [phone]    Remember: Do not eat food or drink liquids after Midnight.    BRUSH YOUR TEETH MORNING OF SURGERY AND RINSE YOUR MOUTH OUT, NO CHEWING GUM CANDY OR MINTS.     Take these medicines the morning of surgery with A SIP OF WATER: Alprazolam (Xanax) if needed, Albuterol inhaler if needed, Gabapentin (Neurontin), Duloxetine (Cymbalta), Meclizine  (Antivert) if needed, Atorvastatin (lipitor), Aripiprazole (Abilify), Metoprolol Tartrate (Lopressor), Allopurinol (Zyloprim, Famotidine (Pepcid), Pantoprazole (Protonix), Eye Drops if needed                                 You may not have any metal on your body including hair pins and              piercings  Do not wear jewelry, make-up, lotions, powders or perfumes, deodorant             Do not wear nail polish on your fingernails.  Do not shave  48 hours prior to surgery.                 Do not bring valuables to the hospital. [place].  Contacts, dentures or bridgework may not be worn into surgery.  Leave suitcase in the car. After surgery it may be brought to your room.   Please your CPAP mask and tubing                Please read over the following fact sheets you were given: _____________________________________________________________________             [facility] - Preparing for Surgery Before surgery, you can play an important role.  Because skin is not sterile, your skin needs to be as free of germs as possible.  You can reduce the number of germs on your skin by washing with CHG (chlorahexidine gluconate) soap before surgery.  CHG is an antiseptic cleaner which kills germs and bonds with the skin to continue killing germs even after washing. Please DO NOT use if you have an allergy to CHG or antibacterial soaps.  If your skin becomes reddened/irritated stop using the CHG and inform your nurse when you arrive at Short Stay. Do not shave (including legs and underarms) for at least 48 hours prior to the first CHG shower.  You may shave your face/neck. Please follow these instructions carefully:  1.  Shower with CHG Soap the night before  surgery and the  morning of Surgery.  2.  If you choose to wash your hair, wash your hair first as usual with your  normal  shampoo.  3.  After you shampoo, rinse your hair and body thoroughly to remove the  shampoo.                           4.  Use CHG as you would any other liquid soap.  You can apply chg directly  to the skin and wash                       Gently with a scrungie or clean washcloth.  5.  Apply the CHG Soap to your body ONLY FROM THE NECK DOWN.   Do not use on face/ open                           Wound or open sores. Avoid contact with eyes, ears mouth and genitals (private parts).                       Wash face,  Genitals (private parts) with your normal soap.             6.  Wash thoroughly, paying special attention to the area where your surgery  will be performed.  7.  Thoroughly rinse your body with warm water from the neck down.  8.  DO NOT shower/wash with your normal soap after using and rinsing off  the CHG Soap.                9.  Pat  yourself dry with a clean towel.            10.  Wear clean pajamas.            11.  Place clean sheets on your bed the night of your first shower and do not  sleep with pets. Day of Surgery : Do not apply any lotions/deodorants the morning of surgery.  Please wear clean clothes to the hospital/surgery center.  FAILURE TO FOLLOW THESE INSTRUCTIONS MAY RESULT IN THE CANCELLATION OF YOUR SURGERY PATIENT SIGNATURE_________________________________  NURSE SIGNATURE__________________________________  ________________________________________________________________________

## 2019-09-25 ENCOUNTER — Other Ambulatory Visit (HOSPITAL_COMMUNITY)
Admission: RE | Admit: 2019-09-25 | Discharge: 2019-09-25 | Disposition: A | Discharge status: Home / Self Care | Payer: Medicare HMO | Source: Ambulatory Visit | Attending: Surgery | Admitting: Surgery

## 2019-09-25 ENCOUNTER — Encounter (HOSPITAL_COMMUNITY): Payer: Self-pay

## 2019-09-25 ENCOUNTER — Other Ambulatory Visit: Payer: Self-pay

## 2019-09-25 ENCOUNTER — Encounter (HOSPITAL_COMMUNITY)
Admission: RE | Admit: 2019-09-25 | Discharge: 2019-09-25 | Disposition: A | Discharge status: Home / Self Care | Payer: Medicare HMO | Source: Ambulatory Visit | Attending: Surgery | Admitting: Surgery

## 2019-09-25 DIAGNOSIS — Z20828 Contact with and (suspected) exposure to other viral communicable diseases: Secondary | ICD-10-CM | POA: Insufficient documentation

## 2019-09-25 DIAGNOSIS — Z01812 Encounter for preprocedural laboratory examination: Secondary | ICD-10-CM | POA: Insufficient documentation

## 2019-09-25 HISTORY — DX: Personal history of urinary calculi: Z87.442

## 2019-09-25 LAB — CBC
HCT: 42.9 % (ref 36.0–46.0)
Hemoglobin: 13.3 g/dL (ref 12.0–15.0)
MCH: 29.6 pg (ref 26.0–34.0)
MCHC: 31 g/dL (ref 30.0–36.0)
MCV: 95.5 fL (ref 80.0–100.0)
Platelets: 348 10*3/uL (ref 150–400)
RBC: 4.49 MIL/uL (ref 3.87–5.11)
RDW: 14.7 % (ref 11.5–15.5)
WBC: 9.3 10*3/uL (ref 4.0–10.5)
nRBC: 0 % (ref 0.0–0.2)

## 2019-09-25 LAB — BASIC METABOLIC PANEL
Anion gap: 9 (ref 5–15)
BUN: 26 mg/dL — ABNORMAL HIGH (ref 8–23)
CO2: 25 mmol/L (ref 22–32)
Calcium: 9.1 mg/dL (ref 8.9–10.3)
Chloride: 106 mmol/L (ref 98–111)
Creatinine, Ser: 0.77 mg/dL (ref 0.44–1.00)
GFR calc Af Amer: >60 mL/min (ref >60)
GFR calc non Af Amer: >60 mL/min (ref >60)
Glucose, Bld: 121 mg/dL — ABNORMAL HIGH (ref 70–99)
Potassium: 3.7 mmol/L (ref 3.5–5.1)
Sodium: 140 mmol/L (ref 135–145)

## 2019-09-26 LAB — NOVEL CORONAVIRUS, NAA (HOSP ORDER, SEND-OUT TO REF LAB; TAT 18-24 HRS): SARS-CoV-2, NAA: NOT DETECTED

## 2019-09-28 ENCOUNTER — Other Ambulatory Visit: Payer: Self-pay | Admitting: Rheumatology

## 2019-09-28 MED ORDER — BUPIVACAINE LIPOSOME 1.3 % IJ SUSP
20.0000 mL | Freq: Once | INTRAMUSCULAR | Status: DC
  Filled 2019-09-28: qty 20

## 2019-09-28 NOTE — Anesthesia Preprocedure Evaluation (Addendum)
Anesthesia Evaluation  Patient identified by MRN, date of birth, ID band Patient awake    Reviewed: Allergy & Precautions  Airway Mallampati: II  TM Distance: >3 FB     Dental   Pulmonary    breath sounds clear to auscultation       Cardiovascular  Rhythm:Regular Rate:Normal     Neuro/Psych    GI/Hepatic GERD  ,  Endo/Other    Renal/GU Renal disease     Musculoskeletal   Abdominal   Peds  Hematology  (+) anemia ,   Anesthesia Other Findings   Reproductive/Obstetrics                           Anesthesia Physical Anesthesia Plan  ASA: III  Anesthesia Plan: General   Post-op Pain Management:    Induction:   PONV Risk Score and Plan: 3 and Dexamethasone, Ondansetron and Midazolam  Airway Management Planned: Oral ETT  Additional Equipment:   Intra-op Plan:   Post-operative Plan: Possible Post-op intubation/ventilation  Informed Consent: I have reviewed the patients History and Physical, chart, labs and discussed the procedure including the risks, benefits and alternatives for the proposed anesthesia with the patient or authorized representative who has indicated his/her understanding and acceptance.     Dental advisory given  Plan Discussed with: Anesthesiologist and CRNA  Anesthesia Plan Comments:        Anesthesia Quick Evaluation

## 2019-09-28 NOTE — Telephone Encounter (Signed)
Last Visit: 07/08/19 Next Visit: 01/05/20 Labs: 09/25/19 Glucose 121 BUN 26  Okay to refill per Dr. Estanislado Pandy

## 2019-09-29 ENCOUNTER — Other Ambulatory Visit: Payer: Self-pay

## 2019-09-29 ENCOUNTER — Encounter (HOSPITAL_COMMUNITY)
Admission: RE | Disposition: A | Discharge status: Home / Self Care | Payer: Self-pay | Source: Home / Self Care | Attending: Surgery

## 2019-09-29 ENCOUNTER — Inpatient Hospital Stay (HOSPITAL_COMMUNITY): Payer: Medicare HMO | Admitting: Certified Registered Nurse Anesthetist

## 2019-09-29 ENCOUNTER — Inpatient Hospital Stay (HOSPITAL_COMMUNITY)
Admission: RE | Admit: 2019-09-29 | Discharge: 2019-10-01 | DRG: 621 | Disposition: A | Discharge status: Home / Self Care | Payer: Medicare HMO | Attending: Surgery | Admitting: Surgery

## 2019-09-29 ENCOUNTER — Encounter (HOSPITAL_COMMUNITY): Payer: Self-pay

## 2019-09-29 ENCOUNTER — Inpatient Hospital Stay (HOSPITAL_COMMUNITY): Payer: Medicare HMO | Admitting: Physician Assistant

## 2019-09-29 DIAGNOSIS — R5382 Chronic fatigue, unspecified: Secondary | ICD-10-CM | POA: Diagnosis not present

## 2019-09-29 DIAGNOSIS — K429 Umbilical hernia without obstruction or gangrene: Secondary | ICD-10-CM | POA: Diagnosis not present

## 2019-09-29 DIAGNOSIS — K3184 Gastroparesis: Secondary | ICD-10-CM | POA: Diagnosis present

## 2019-09-29 DIAGNOSIS — E7800 Pure hypercholesterolemia, unspecified: Secondary | ICD-10-CM | POA: Diagnosis present

## 2019-09-29 DIAGNOSIS — Z96651 Presence of right artificial knee joint: Secondary | ICD-10-CM | POA: Diagnosis present

## 2019-09-29 DIAGNOSIS — G43909 Migraine, unspecified, not intractable, without status migrainosus: Secondary | ICD-10-CM | POA: Diagnosis not present

## 2019-09-29 DIAGNOSIS — G609 Hereditary and idiopathic neuropathy, unspecified: Secondary | ICD-10-CM | POA: Diagnosis present

## 2019-09-29 DIAGNOSIS — K219 Gastro-esophageal reflux disease without esophagitis: Secondary | ICD-10-CM | POA: Diagnosis present

## 2019-09-29 DIAGNOSIS — G2581 Restless legs syndrome: Secondary | ICD-10-CM | POA: Diagnosis present

## 2019-09-29 DIAGNOSIS — Z9884 Bariatric surgery status: Secondary | ICD-10-CM

## 2019-09-29 DIAGNOSIS — M797 Fibromyalgia: Secondary | ICD-10-CM | POA: Diagnosis present

## 2019-09-29 DIAGNOSIS — E785 Hyperlipidemia, unspecified: Secondary | ICD-10-CM | POA: Diagnosis present

## 2019-09-29 DIAGNOSIS — G4733 Obstructive sleep apnea (adult) (pediatric): Secondary | ICD-10-CM | POA: Diagnosis present

## 2019-09-29 DIAGNOSIS — M47816 Spondylosis without myelopathy or radiculopathy, lumbar region: Secondary | ICD-10-CM | POA: Diagnosis present

## 2019-09-29 DIAGNOSIS — Z6841 Body Mass Index (BMI) 40.0 and over, adult: Secondary | ICD-10-CM | POA: Diagnosis not present

## 2019-09-29 DIAGNOSIS — M47812 Spondylosis without myelopathy or radiculopathy, cervical region: Secondary | ICD-10-CM | POA: Diagnosis present

## 2019-09-29 HISTORY — PX: UMBILICAL HERNIA REPAIR: SHX196

## 2019-09-29 HISTORY — PX: GASTRIC ROUX-EN-Y: SHX5262

## 2019-09-29 LAB — COMPREHENSIVE METABOLIC PANEL
ALT: 24 U/L (ref 0–44)
AST: 25 U/L (ref 15–41)
Albumin: 3.6 g/dL (ref 3.5–5.0)
Alkaline Phosphatase: 105 U/L (ref 38–126)
Anion gap: 9 (ref 5–15)
BUN: 22 mg/dL (ref 8–23)
CO2: 24 mmol/L (ref 22–32)
Calcium: 8.7 mg/dL — ABNORMAL LOW (ref 8.9–10.3)
Chloride: 105 mmol/L (ref 98–111)
Creatinine, Ser: 0.74 mg/dL (ref 0.44–1.00)
GFR calc Af Amer: >60 mL/min (ref >60)
GFR calc non Af Amer: >60 mL/min (ref >60)
Glucose, Bld: 96 mg/dL (ref 70–99)
Potassium: 3.2 mmol/L — ABNORMAL LOW (ref 3.5–5.1)
Sodium: 138 mmol/L (ref 135–145)
Total Bilirubin: 0.6 mg/dL (ref 0.3–1.2)
Total Protein: 7 g/dL (ref 6.5–8.1)

## 2019-09-29 LAB — CBC
HCT: 39 % (ref 36.0–46.0)
Hemoglobin: 12 g/dL (ref 12.0–15.0)
MCH: 29.8 pg (ref 26.0–34.0)
MCHC: 30.8 g/dL (ref 30.0–36.0)
MCV: 96.8 fL (ref 80.0–100.0)
Platelets: 295 10*3/uL (ref 150–400)
RBC: 4.03 MIL/uL (ref 3.87–5.11)
RDW: 14.6 % (ref 11.5–15.5)
WBC: 15.1 10*3/uL — ABNORMAL HIGH (ref 4.0–10.5)
nRBC: 0 % (ref 0.0–0.2)

## 2019-09-29 LAB — TYPE AND SCREEN
ABO/RH(D): O POS
Antibody Screen: NEGATIVE

## 2019-09-29 LAB — CREATININE, SERUM
Creatinine, Ser: 0.88 mg/dL (ref 0.44–1.00)
GFR calc Af Amer: >60 mL/min (ref >60)
GFR calc non Af Amer: >60 mL/min (ref >60)

## 2019-09-29 LAB — GLUCOSE, CAPILLARY: Glucose-Capillary: 105 mg/dL — ABNORMAL HIGH (ref 70–99)

## 2019-09-29 SURGERY — LAPAROSCOPIC ROUX-EN-Y GASTRIC BYPASS WITH UPPER ENDOSCOPY
Anesthesia: General | Site: Abdomen

## 2019-09-29 MED ORDER — SUGAMMADEX SODIUM 500 MG/5ML IV SOLN
INTRAVENOUS | Status: DC | PRN
  Administered 2019-09-29: 250 mg via INTRAVENOUS

## 2019-09-29 MED ORDER — HEPARIN SODIUM (PORCINE) 5000 UNIT/ML IJ SOLN
5000.0000 [IU] | INTRAMUSCULAR | Status: AC
  Administered 2019-09-29: 5000 [IU] via SUBCUTANEOUS
  Filled 2019-09-29: qty 1

## 2019-09-29 MED ORDER — ENSURE MAX PROTEIN PO LIQD
2.0000 [oz_av] | ORAL | Status: DC
  Administered 2019-09-30 – 2019-10-01 (×9): 2 [oz_av] via ORAL

## 2019-09-29 MED ORDER — MORPHINE SULFATE (PF) 4 MG/ML IV SOLN
1.0000 mg | INTRAVENOUS | Status: DC | PRN
  Administered 2019-09-29 (×2): 2 mg via INTRAVENOUS
  Administered 2019-09-30: 3 mg via INTRAVENOUS
  Filled 2019-09-29 (×3): qty 1

## 2019-09-29 MED ORDER — 0.9 % SODIUM CHLORIDE (POUR BTL) OPTIME
TOPICAL | Status: DC | PRN
  Administered 2019-09-29: 09:00:00 1000 mL

## 2019-09-29 MED ORDER — ONDANSETRON HCL 4 MG/2ML IJ SOLN
INTRAMUSCULAR | Status: AC
  Filled 2019-09-29: qty 2

## 2019-09-29 MED ORDER — ALBUTEROL SULFATE (2.5 MG/3ML) 0.083% IN NEBU: 3.0000 mL | INHALATION_SOLUTION | Freq: Four times a day (QID) | RESPIRATORY_TRACT | Status: DC | PRN

## 2019-09-29 MED ORDER — ACETAMINOPHEN 160 MG/5ML PO SOLN
1000.0000 mg | Freq: Three times a day (TID) | ORAL | Status: DC
  Administered 2019-09-29 – 2019-10-01 (×5): 1000 mg via ORAL
  Filled 2019-09-29 (×6): qty 40.6

## 2019-09-29 MED ORDER — APREPITANT 40 MG PO CAPS
40.0000 mg | ORAL_CAPSULE | ORAL | Status: AC
  Administered 2019-09-29: 40 mg via ORAL
  Filled 2019-09-29: qty 1

## 2019-09-29 MED ORDER — LACTATED RINGERS IV SOLN
INTRAVENOUS | Status: DC
  Administered 2019-09-29 (×3): via INTRAVENOUS

## 2019-09-29 MED ORDER — PHENYLEPHRINE HCL (PRESSORS) 10 MG/ML IV SOLN
INTRAVENOUS | Status: AC
  Filled 2019-09-29: qty 1

## 2019-09-29 MED ORDER — LIDOCAINE HCL 2 % IJ SOLN
INTRAMUSCULAR | Status: AC
  Filled 2019-09-29: qty 20

## 2019-09-29 MED ORDER — SODIUM CHLORIDE 0.9 % IV SOLN
2.0000 g | INTRAVENOUS | Status: AC
  Administered 2019-09-29: 08:00:00 2 g via INTRAVENOUS
  Filled 2019-09-29: qty 2

## 2019-09-29 MED ORDER — PROPOFOL 500 MG/50ML IV EMUL
INTRAVENOUS | Status: AC
  Filled 2019-09-29: qty 50

## 2019-09-29 MED ORDER — DEXAMETHASONE SODIUM PHOSPHATE 10 MG/ML IJ SOLN
INTRAMUSCULAR | Status: DC | PRN
  Administered 2019-09-29: 5 mg via INTRAVENOUS

## 2019-09-29 MED ORDER — EPHEDRINE 5 MG/ML INJ
INTRAVENOUS | Status: AC
  Filled 2019-09-29: qty 10

## 2019-09-29 MED ORDER — ONDANSETRON HCL 4 MG/2ML IJ SOLN
INTRAMUSCULAR | Status: DC | PRN
  Administered 2019-09-29 (×2): 4 mg via INTRAVENOUS

## 2019-09-29 MED ORDER — DEXAMETHASONE SODIUM PHOSPHATE 10 MG/ML IJ SOLN
INTRAMUSCULAR | Status: AC
  Filled 2019-09-29: qty 1

## 2019-09-29 MED ORDER — LACTATED RINGERS IR SOLN
Status: DC | PRN
  Administered 2019-09-29: 1000 mL

## 2019-09-29 MED ORDER — LIDOCAINE 2% (20 MG/ML) 5 ML SYRINGE
INTRAMUSCULAR | Status: DC | PRN
  Administered 2019-09-29: 100 mg via INTRAVENOUS

## 2019-09-29 MED ORDER — LIDOCAINE 2% (20 MG/ML) 5 ML SYRINGE
INTRAMUSCULAR | Status: AC
  Filled 2019-09-29: qty 5

## 2019-09-29 MED ORDER — PROPOFOL 10 MG/ML IV BOLUS
INTRAVENOUS | Status: AC
  Filled 2019-09-29: qty 20

## 2019-09-29 MED ORDER — EPHEDRINE SULFATE-NACL 50-0.9 MG/10ML-% IV SOSY
PREFILLED_SYRINGE | INTRAVENOUS | Status: DC | PRN
  Administered 2019-09-29: 15 mg via INTRAVENOUS
  Administered 2019-09-29: 10 mg via INTRAVENOUS
  Administered 2019-09-29 (×3): 15 mg via INTRAVENOUS

## 2019-09-29 MED ORDER — CHLORHEXIDINE GLUCONATE CLOTH 2 % EX PADS: 6.0000 | MEDICATED_PAD | Freq: Once | CUTANEOUS | Status: DC

## 2019-09-29 MED ORDER — PHENYLEPHRINE HCL-NACL 10-0.9 MG/250ML-% IV SOLN: INTRAVENOUS | Status: DC | PRN

## 2019-09-29 MED ORDER — ALLOPURINOL 300 MG PO TABS
300.0000 mg | ORAL_TABLET | Freq: Every day | ORAL | Status: DC
  Administered 2019-09-30 – 2019-10-01 (×2): 300 mg via ORAL
  Filled 2019-09-29 (×2): qty 1

## 2019-09-29 MED ORDER — BUPIVACAINE LIPOSOME 1.3 % IJ SUSP
INTRAMUSCULAR | Status: DC | PRN
  Administered 2019-09-29: 20 mL

## 2019-09-29 MED ORDER — FENTANYL CITRATE (PF) 100 MCG/2ML IJ SOLN
INTRAMUSCULAR | Status: AC
  Filled 2019-09-29: qty 4

## 2019-09-29 MED ORDER — PHENYLEPHRINE 40 MCG/ML (10ML) SYRINGE FOR IV PUSH (FOR BLOOD PRESSURE SUPPORT)
PREFILLED_SYRINGE | INTRAVENOUS | Status: DC | PRN
  Administered 2019-09-29: 80 ug via INTRAVENOUS

## 2019-09-29 MED ORDER — ROCURONIUM BROMIDE 50 MG/5ML IV SOSY
PREFILLED_SYRINGE | INTRAVENOUS | Status: DC | PRN
  Administered 2019-09-29: 20 mg via INTRAVENOUS
  Administered 2019-09-29 (×2): 10 mg via INTRAVENOUS
  Administered 2019-09-29: 60 mg via INTRAVENOUS

## 2019-09-29 MED ORDER — PROPOFOL 10 MG/ML IV BOLUS
INTRAVENOUS | Status: DC | PRN
  Administered 2019-09-29: 200 mg via INTRAVENOUS

## 2019-09-29 MED ORDER — FENTANYL CITRATE (PF) 100 MCG/2ML IJ SOLN
25.0000 ug | INTRAMUSCULAR | Status: DC | PRN
  Administered 2019-09-29: 12:00:00 50 ug via INTRAVENOUS

## 2019-09-29 MED ORDER — OXYCODONE HCL 5 MG/5ML PO SOLN
5.0000 mg | Freq: Four times a day (QID) | ORAL | Status: DC | PRN
  Administered 2019-09-29 – 2019-09-30 (×3): 5 mg via ORAL
  Filled 2019-09-29 (×3): qty 5

## 2019-09-29 MED ORDER — GABAPENTIN 300 MG PO CAPS
300.0000 mg | ORAL_CAPSULE | ORAL | Status: AC
  Administered 2019-09-29: 300 mg via ORAL
  Filled 2019-09-29: qty 1

## 2019-09-29 MED ORDER — ACETAMINOPHEN 500 MG PO TABS
1000.0000 mg | ORAL_TABLET | ORAL | Status: AC
  Administered 2019-09-29: 1000 mg via ORAL
  Filled 2019-09-29: qty 2

## 2019-09-29 MED ORDER — TISSEEL VH 10 ML EX KIT
PACK | CUTANEOUS | Status: AC
  Filled 2019-09-29: qty 2

## 2019-09-29 MED ORDER — TISSEEL VH 10 ML EX KIT
PACK | CUTANEOUS | Status: DC | PRN
  Administered 2019-09-29: 10 mL

## 2019-09-29 MED ORDER — KETAMINE HCL 10 MG/ML IJ SOLN
INTRAMUSCULAR | Status: AC
  Filled 2019-09-29: qty 1

## 2019-09-29 MED ORDER — KETAMINE HCL 10 MG/ML IJ SOLN
INTRAMUSCULAR | Status: DC | PRN
  Administered 2019-09-29: 30 mg via INTRAVENOUS

## 2019-09-29 MED ORDER — LIDOCAINE 2% (20 MG/ML) 5 ML SYRINGE
INTRAMUSCULAR | Status: DC | PRN
  Administered 2019-09-29: 1 mg/kg/h via INTRAVENOUS

## 2019-09-29 MED ORDER — DULOXETINE HCL 60 MG PO CPEP
60.0000 mg | ORAL_CAPSULE | Freq: Two times a day (BID) | ORAL | Status: DC
  Administered 2019-09-29 – 2019-10-01 (×4): 60 mg via ORAL
  Filled 2019-09-29 (×4): qty 1

## 2019-09-29 MED ORDER — PHENYLEPHRINE HCL-NACL 10-0.9 MG/250ML-% IV SOLN
INTRAVENOUS | Status: DC | PRN
  Administered 2019-09-29: 50 ug/min via INTRAVENOUS

## 2019-09-29 MED ORDER — ACETAMINOPHEN 500 MG PO TABS: 1000.0000 mg | ORAL_TABLET | Freq: Three times a day (TID) | ORAL | Status: DC

## 2019-09-29 MED ORDER — MIDAZOLAM HCL 2 MG/2ML IJ SOLN
INTRAMUSCULAR | Status: AC
  Filled 2019-09-29: qty 2

## 2019-09-29 MED ORDER — SUCCINYLCHOLINE CHLORIDE 200 MG/10ML IV SOSY
PREFILLED_SYRINGE | INTRAVENOUS | Status: DC | PRN
  Administered 2019-09-29: 120 mg via INTRAVENOUS

## 2019-09-29 MED ORDER — PANTOPRAZOLE SODIUM 40 MG IV SOLR
40.0000 mg | Freq: Every day | INTRAVENOUS | Status: DC
  Administered 2019-09-29 – 2019-09-30 (×2): 40 mg via INTRAVENOUS
  Filled 2019-09-29 (×2): qty 40

## 2019-09-29 MED ORDER — FLUTICASONE PROPIONATE 50 MCG/ACT NA SUSP
2.0000 | Freq: Every day | NASAL | Status: DC
  Administered 2019-09-29 – 2019-09-30 (×2): 2 via NASAL
  Filled 2019-09-29: qty 16

## 2019-09-29 MED ORDER — HEPARIN SODIUM (PORCINE) 5000 UNIT/ML IJ SOLN
5000.0000 [IU] | Freq: Three times a day (TID) | INTRAMUSCULAR | Status: DC
  Administered 2019-09-29 – 2019-10-01 (×6): 5000 [IU] via SUBCUTANEOUS
  Filled 2019-09-29 (×6): qty 1

## 2019-09-29 MED ORDER — ROCURONIUM BROMIDE 10 MG/ML (PF) SYRINGE
PREFILLED_SYRINGE | INTRAVENOUS | Status: AC
  Filled 2019-09-29: qty 10

## 2019-09-29 MED ORDER — PHENYLEPHRINE 40 MCG/ML (10ML) SYRINGE FOR IV PUSH (FOR BLOOD PRESSURE SUPPORT)
PREFILLED_SYRINGE | INTRAVENOUS | Status: AC
  Filled 2019-09-29: qty 10

## 2019-09-29 MED ORDER — SUGAMMADEX SODIUM 500 MG/5ML IV SOLN
INTRAVENOUS | Status: AC
  Filled 2019-09-29: qty 5

## 2019-09-29 MED ORDER — ONDANSETRON HCL 4 MG/2ML IJ SOLN
4.0000 mg | INTRAMUSCULAR | Status: DC | PRN
  Administered 2019-09-29 – 2019-10-01 (×5): 4 mg via INTRAVENOUS
  Filled 2019-09-29 (×5): qty 2

## 2019-09-29 MED ORDER — FENTANYL CITRATE (PF) 250 MCG/5ML IJ SOLN
INTRAMUSCULAR | Status: AC
  Filled 2019-09-29: qty 5

## 2019-09-29 MED ORDER — FENTANYL CITRATE (PF) 250 MCG/5ML IJ SOLN
INTRAMUSCULAR | Status: DC | PRN
  Administered 2019-09-29 (×4): 50 ug via INTRAVENOUS

## 2019-09-29 MED ORDER — SODIUM CHLORIDE (PF) 0.9 % IJ SOLN
INTRAMUSCULAR | Status: DC | PRN
  Administered 2019-09-29: 10 mL

## 2019-09-29 MED ORDER — KCL IN DEXTROSE-NACL 20-5-0.45 MEQ/L-%-% IV SOLN
INTRAVENOUS | Status: DC
  Administered 2019-09-29 – 2019-09-30 (×3): via INTRAVENOUS
  Filled 2019-09-29 (×4): qty 1000

## 2019-09-29 MED ORDER — METOPROLOL TARTRATE 5 MG/5ML IV SOLN: 5.0000 mg | Freq: Four times a day (QID) | INTRAVENOUS | Status: DC | PRN

## 2019-09-29 MED ORDER — SODIUM CHLORIDE (PF) 0.9 % IJ SOLN
INTRAMUSCULAR | Status: AC
  Filled 2019-09-29: qty 10

## 2019-09-29 MED ORDER — SUCCINYLCHOLINE CHLORIDE 200 MG/10ML IV SOSY
PREFILLED_SYRINGE | INTRAVENOUS | Status: AC
  Filled 2019-09-29: qty 10

## 2019-09-29 SURGICAL SUPPLY — 82 items
ADH SKN CLS APL DERMABOND .7 (GAUZE/BANDAGES/DRESSINGS) ×1
APL SKNCLS STERI-STRIP NONHPOA (GAUZE/BANDAGES/DRESSINGS)
APL SRG 32X5 SNPLK LF DISP (MISCELLANEOUS) ×1
APL SWBSTK 6 STRL LF DISP (MISCELLANEOUS) ×2
APPLICATOR COTTON TIP 6 STRL (MISCELLANEOUS) ×2 IMPLANT
APPLICATOR COTTON TIP 6IN STRL (MISCELLANEOUS) ×4
APPLIER CLIP ROT 10 11.4 M/L (STAPLE)
APPLIER CLIP ROT 13.4 12 LRG (CLIP)
APR CLP LRG 13.4X12 ROT 20 MLT (CLIP)
APR CLP MED LRG 11.4X10 (STAPLE)
BENZOIN TINCTURE PRP APPL 2/3 (GAUZE/BANDAGES/DRESSINGS) IMPLANT
BINDER ABDOMINAL 12 ML 46-62 (SOFTGOODS) ×1 IMPLANT
BLADE SURG 15 STRL LF DISP TIS (BLADE) ×1 IMPLANT
BLADE SURG 15 STRL SS (BLADE) ×2
CABLE HIGH FREQUENCY MONO STRZ (ELECTRODE) IMPLANT
CLIP APPLIE ROT 10 11.4 M/L (STAPLE) IMPLANT
CLIP APPLIE ROT 13.4 12 LRG (CLIP) IMPLANT
CLIP SUT LAPRA TY ABSORB (SUTURE) ×2 IMPLANT
COVER WAND RF STERILE (DRAPES) IMPLANT
DERMABOND ADVANCED (GAUZE/BANDAGES/DRESSINGS) ×1
DERMABOND ADVANCED .7 DNX12 (GAUZE/BANDAGES/DRESSINGS) IMPLANT
DEVICE SUT QUICK LOAD TK 5 (STAPLE) IMPLANT
DEVICE SUT TI-KNOT TK 5X26 (MISCELLANEOUS) IMPLANT
DEVICE SUTURE ENDOST 10MM (ENDOMECHANICALS) ×2 IMPLANT
DISSECTOR BLUNT TIP ENDO 5MM (MISCELLANEOUS) IMPLANT
DRAIN PENROSE 18X1/4 LTX STRL (WOUND CARE) ×2 IMPLANT
GAUZE 4X4 16PLY RFD (DISPOSABLE) ×2 IMPLANT
GAUZE SPONGE 4X4 12PLY STRL (GAUZE/BANDAGES/DRESSINGS) IMPLANT
GLOVE BIOGEL M 8.0 STRL (GLOVE) ×2 IMPLANT
GOWN STRL REUS W/TWL XL LVL3 (GOWN DISPOSABLE) ×8 IMPLANT
HANDLE STAPLE EGIA 4 XL (STAPLE) ×2 IMPLANT
HOVERMATT SINGLE USE (MISCELLANEOUS) ×2 IMPLANT
KIT BASIN OR (CUSTOM PROCEDURE TRAY) ×2 IMPLANT
KIT GASTRIC LAVAGE 34FR ADT (SET/KITS/TRAYS/PACK) ×2 IMPLANT
KIT TURNOVER KIT A (KITS) IMPLANT
MARKER SKIN DUAL TIP RULER LAB (MISCELLANEOUS) ×2 IMPLANT
NDL SPNL 22GX3.5 QUINCKE BK (NEEDLE) ×1 IMPLANT
NEEDLE SPNL 22GX3.5 QUINCKE BK (NEEDLE) ×2 IMPLANT
PACK CARDIOVASCULAR III (CUSTOM PROCEDURE TRAY) ×2 IMPLANT
RELOAD EGIA 45 MED/THCK PURPLE (STAPLE) ×1 IMPLANT
RELOAD EGIA 45 TAN VASC (STAPLE) IMPLANT
RELOAD EGIA 60 MED/THCK PURPLE (STAPLE) ×4 IMPLANT
RELOAD EGIA 60 TAN VASC (STAPLE) IMPLANT
RELOAD ENDO STITCH 2.0 (ENDOMECHANICALS) ×22
RELOAD STAPLE 45 PURP MED/THCK (STAPLE) IMPLANT
RELOAD STAPLE 60 MED/THCK ART (STAPLE) IMPLANT
RELOAD SUT SNGL STCH ABSRB 2-0 (ENDOMECHANICALS) ×5 IMPLANT
RELOAD SUT SNGL STCH BLK 2-0 (ENDOMECHANICALS) ×4 IMPLANT
RELOAD TRI 45 ART MED THCK PUR (STAPLE) ×2 IMPLANT
RELOAD TRI 60 ART MED THCK PUR (STAPLE) ×2 IMPLANT
SCISSORS LAP 5X45 EPIX DISP (ENDOMECHANICALS) ×2 IMPLANT
SEALANT SURGICAL APPL DUAL CAN (MISCELLANEOUS) ×2 IMPLANT
SET IRRIG TUBING LAPAROSCOPIC (IRRIGATION / IRRIGATOR) ×2 IMPLANT
SET TUBE SMOKE EVAC HIGH FLOW (TUBING) ×2 IMPLANT
SHEARS HARMONIC ACE PLUS 45CM (MISCELLANEOUS) ×2 IMPLANT
SLEEVE ADV FIXATION 12X100MM (TROCAR) ×4 IMPLANT
SLEEVE ADV FIXATION 5X100MM (TROCAR) IMPLANT
SOL ANTI FOG 6CC (MISCELLANEOUS) ×1 IMPLANT
SOLUTION ANTI FOG 6CC (MISCELLANEOUS) ×1
STAPLER VISISTAT 35W (STAPLE) ×2 IMPLANT
STRIP CLOSURE SKIN 1/2X4 (GAUZE/BANDAGES/DRESSINGS) IMPLANT
SUT MNCRL AB 4-0 PS2 18 (SUTURE) ×2 IMPLANT
SUT NOVA 1 T20/GS 25DT (SUTURE) ×1 IMPLANT
SUT RELOAD ENDO STITCH 2 48X1 (ENDOMECHANICALS) ×6
SUT RELOAD ENDO STITCH 2.0 (ENDOMECHANICALS) ×5
SUT SURGIDAC NAB ES-9 0 48 120 (SUTURE) IMPLANT
SUT VIC AB 2-0 SH 27 (SUTURE) ×2
SUT VIC AB 2-0 SH 27X BRD (SUTURE) ×1 IMPLANT
SUT VIC AB 4-0 SH 18 (SUTURE) ×2 IMPLANT
SUTURE RELOAD END STTCH 2 48X1 (ENDOMECHANICALS) ×6 IMPLANT
SUTURE RELOAD ENDO STITCH 2.0 (ENDOMECHANICALS) ×5 IMPLANT
SYR 10ML ECCENTRIC (SYRINGE) ×2 IMPLANT
SYR 20ML LL LF (SYRINGE) ×4 IMPLANT
SYR 50ML LL SCALE MARK (SYRINGE) ×2 IMPLANT
TOWEL OR 17X26 10 PK STRL BLUE (TOWEL DISPOSABLE) ×4 IMPLANT
TOWEL OR NON WOVEN STRL DISP B (DISPOSABLE) ×2 IMPLANT
TRAY FOLEY MTR SLVR 16FR STAT (SET/KITS/TRAYS/PACK) ×2 IMPLANT
TROCAR ADV FIXATION 12X100MM (TROCAR) ×2 IMPLANT
TROCAR ADV FIXATION 5X100MM (TROCAR) ×2 IMPLANT
TROCAR BLADELESS OPT 5 100 (ENDOMECHANICALS) ×2 IMPLANT
TROCAR XCEL 12X100 BLDLESS (ENDOMECHANICALS) ×2 IMPLANT
TUBING CONNECTING 10 (TUBING) ×4 IMPLANT

## 2019-09-29 NOTE — Transfer of Care (Signed)
Immediate Anesthesia Transfer of Care Note  Patient: [name]  Procedure(s) Performed: LAPAROSCOPIC ROUX-EN-Y GASTRIC BYPASS WITH UPPER ENDOSCOPY, ERAS Pathway (N/A Abdomen) HERNIA REPAIR UMBILICAL ADULT (N/A Abdomen)  Patient Location: PACU  Anesthesia Type:General  Level of Consciousness: awake, alert  and oriented  Airway & Oxygen Therapy: Patient Spontanous Breathing and Patient connected to face mask oxygen  Post-op Assessment: Report given to RN and Post -op Vital signs reviewed and stable  Post vital signs: Reviewed and stable  Last Vitals:  Vitals Value Taken Time  BP 102/55 09/29/19 1115  Temp    Pulse 75 09/29/19 1117  Resp 15 09/29/19 1117  SpO2 100 % 09/29/19 1117  Vitals shown include unvalidated device data.  Last Pain:  Vitals:   09/29/19 0628  PainSc: 0-No pain         Complications: No apparent anesthesia complications

## 2019-09-29 NOTE — Anesthesia Procedure Notes (Signed)
Procedure Name: Intubation Date/Time: 09/29/2019 7:38 AM Performed by: Maxwell Caul, CRNA Pre-anesthesia Checklist: Patient identified, Emergency Drugs available, Suction available and Patient being monitored Patient Re-evaluated:Patient Re-evaluated prior to induction Oxygen Delivery Method: Circle system utilized Preoxygenation: Pre-oxygenation with 100% oxygen Induction Type: IV induction Laryngoscope Size: Mac and 4 Grade View: Grade I Tube type: Oral Tube size: 7.5 mm Number of attempts: 1 Airway Equipment and Method: Stylet Placement Confirmation: ETT inserted through vocal cords under direct vision,  positive ETCO2 and breath sounds checked- equal and bilateral Secured at: 21 cm Tube secured with: Tape Dental Injury: Teeth and Oropharynx as per pre-operative assessment

## 2019-09-29 NOTE — Interval H&P Note (Signed)
History and Physical Interval Note:  09/29/2019 7:27 AM  [name]  has presented today for surgery, with the diagnosis of Morbid Obesity, OSA, RA.  The various methods of treatment have been discussed with the patient and family. After consideration of risks, benefits and other options for treatment, the patient has consented to  Procedure(s): LAPAROSCOPIC ROUX-EN-Y GASTRIC BYPASS WITH UPPER ENDOSCOPY, ERAS Pathway (N/A) as a surgical intervention.  The patient's history has been reviewed, patient examined, no change in status, stable for surgery.  I have reviewed the patient's chart and labs.  Questions were answered to the patient's satisfaction.     [name]

## 2019-09-29 NOTE — Progress Notes (Signed)
Pt started drinking first 2oz cup of water at 1600. 

## 2019-09-29 NOTE — H&P (Signed)
[name] Documented: 06/25/2019 11:20 AM Location: [place] Office Patient #: [id number] DOB: 12/19/1955 Married / Language: Cleophus Molt / Race: White Female   History of Present Illness Rodman Key B. Hassell Done MD; 06/25/2019 11:50 AM) The patient is a 63 year old female who presents for a bariatric surgery evaluation. Mr. and Mrs. Ciaburri have returned after her 6 month dietician supervised weight loss program. Her weight today is 231 and her BMI is 42. She felt that she got a lot of insight into her overweight condition. She has GER and they (we) decided to proceed with roux en Y gastric bypass. I answered her questions to her satisfaction. She has gastroparesis, fibromyalgia, acid reflux, chronic fatigue, neuropathy, high cholesterol, migraines, OSA, restless legs. She has a prominent umbilical hernia that we may have to repair at the time of her bypass.    Allergies Malachi Bonds, CMA; 06/25/2019 11:25 AM) Erythromycin *MACROLIDES*  TraMADol HCl *ANALGESICS - OPIOID*   Medication History (Chemira Jones, CMA; 06/25/2019 11:27 AM) Famotidine (40MG  Tablet, Oral) Active. Fluticasone Propionate (50MCG/ACT Suspension, Nasal) Active. ARIPiprazole (5MG  Tablet, Oral) Active. Melatonin (10MG  Tablet, Oral) Active. Vitamin B12 (1000MCG Tablet ER, Oral) Active. DULoxetine HCl (60MG  Capsule DR Part, Oral) Active. Nuvigil (250MG  Tablet, Oral) Active. Pantoprazole Sodium (40MG  Tablet DR, Oral) Active. Nortriptyline HCl (25MG  Capsule, Oral) Active. Colchicine (0.6MG  Capsule, Oral) Active. Atorvastatin Calcium (40MG  Tablet, Oral) Active. Metoprolol Succinate ER (100MG  Tablet ER 24HR, Oral) Active. Topiramate (25MG  Tablet, Oral) Active. Ondansetron (8MG  Tablet Disint, Oral) Active. Prochlorperazine Maleate (10MG  Capsule ER, Oral) Active. ALPRAZolam (0.25MG  Tablet, Oral) Active. ICaps Areds 2 (Oral) Active. Vitamin D (1000UNIT Tablet, Oral)  Active. Cetirizine HCl (10MG  Tablet, Oral) Active. Healthy Colon (Oral) Active. Gabapentin (300MG  Capsule, Oral) Active. Systane Ultra (0.4-0.3% Solution, Ophthalmic) Active. Allopurinol (300MG  Tablet, Oral) Active. Furosemide (20MG  Tablet, Oral) Active. Meloxicam (15MG  Tablet, Oral) Active. Medications Reconciled  Vitals (Chemira Jones CMA; 06/25/2019 11:24 AM) 06/25/2019 11:24 AM Weight: 231.4 lb Height: 62in Body Surface Area: 2.03 m Body Mass Index: 42.32 kg/m  Pulse: 85 (Regular)  BP: 120/72 (Sitting, Left Arm, Standard)       Physical Exam (Talise Sligh B. Hassell Done MD; 06/25/2019 11:51 AM) General Note: obese WF HEENT glasses Neck supple Chest clear Heart SR without murmurs Abdomen-centripedal obesity with prominent umbilical hernia     Assessment & Plan Rodman Key B. Hassell Done MD; 06/25/2019 11:52 AM) MORBID OBESITY, UNSPECIFIED OBESITY TYPE (E66.01) Story: Two weeks prior to surgery Go on the extremely low carb liquid diet One week prior to surgery No aspirin products. Tylenol is acceptable Stop smoking 24 hours prior to surgery No alcoholic beverages Report fever greater than 100.5 or excessive nasal drainage suggesting infection Continue bariatric preop diet Perform bowel prep if ordered Do not eat or drink anything after midnight the night before surgery Do not take any medications except those instructed by the anesthesiologist Morning of surgery Please arrive at the hospital at least 2 hours before your scheduled surgery time. No makeup, fingernail polish or jewelry Bring insurance cards with you Bring your CPAP mask if you use this Impression: .obese white female PMI of 55 with multiple medical issues but not diabetic yet with gastroparesis. Plan roux en Y gastric bypass with possible umbilical hernia repair  Matt B. Hassell Done, MD, FACS

## 2019-09-29 NOTE — Discharge Instructions (Signed)
° ° ° °GASTRIC BYPASS/SLEEVE ° Home Care Instructions ° ° These instructions are to help you care for yourself when you go home. ° °Call: If you have any problems. °• Call [phone] and ask for the surgeon on call °• If you need immediate help, come to the ER at [facility].  °• Tell the ER staff that you are a new post-op gastric bypass or gastric sleeve patient °  °Signs and symptoms to report: • Severe vomiting or nausea °o If you cannot keep down clear liquids for longer than 1 day, call your surgeon  °• Abdominal pain that does not get better after taking your pain medication °• Fever over 100.4° F with chills °• Heart beating over 100 beats a minute °• Shortness of breath at rest °• Chest pain °•  Redness, swelling, drainage, or foul odor at incision (surgical) sites °•  If your incisions open or pull apart °• Swelling or pain in calf (lower leg) °• Diarrhea (Loose bowel movements that happen often), frequent watery, uncontrolled bowel movements °• Constipation, (no bowel movements for 3 days) if this happens: Pick one °o Milk of Magnesia, 2 tablespoons by mouth, 3 times a day for 2 days if needed °o Stop taking Milk of Magnesia once you have a bowel movement °o Call your doctor if constipation continues °Or °o Miralax  (instead of Milk of Magnesia) following the label instructions °o Stop taking Miralax once you have a bowel movement °o Call your doctor if constipation continues °• Anything you think is not normal °  °Normal side effects after surgery: • Unable to sleep at night or unable to focus °• Irritability or moody °• Being tearful (crying) or depressed °These are common complaints, possibly related to your anesthesia medications that put you to sleep, stress of surgery, and change in lifestyle.  This usually goes away a few weeks after surgery.  If these feelings continue, call your primary care doctor. °  °Wound Care: You may have surgical glue, steri-strips, or staples over your incisions after  surgery °• Surgical glue:  Looks like a clear film over your incisions and will wear off a little at a time °• Steri-strips: Strips of tape over your incisions. You may notice a yellowish color on the skin under the steri-strips. This is used to make the   steri-strips stick better. Do not pull the steri-strips off - let them fall off °• Staples: Staples may be removed before you leave the hospital °o If you go home with staples, call [facility], [phone] at for an appointment with your surgeon’s nurse to have staples removed 10 days after surgery. °• Showering: You may shower two (2) days after your surgery unless your surgeon tells you differently °o Wash gently around incisions with warm soapy water, rinse well, and gently pat dry  °o No tub baths until staples are removed, steri-strips fall off or glue is gone.  °  °Medications: • Medications should be liquid or crushed if larger than the size of a dime °• Extended release pills (medication that release a little bit at a time through the day) should NOT be crushed or cut. (examples include XL, ER, DR, SR) °• Depending on the size and number of medications you take, you may need to space (take a few throughout the day)/change the time you take your medications so that you do not over-fill your pouch (smaller stomach) °• Make sure you follow-up with your primary care doctor to   make medication changes needed during rapid weight loss and life-style changes °• If you have diabetes, follow up with the doctor that orders your diabetes medication(s) within one week after surgery and check your blood sugar regularly. °• Do not drive while taking prescription pain medication  °• It is ok to take Tylenol by the bottle instructions with your pain medicine or instead of your pain medicine as needed.  DO NOT TAKE NSAIDS (EXAMPLES OF NSAIDS:  IBUPROFREN/ NAPROXEN)  °Diet:                    First 2 Weeks ° You will see the dietician t about two (2) weeks  after your surgery. The dietician will increase the types of foods you can eat if you are handling liquids well: °• If you have severe vomiting or nausea and cannot keep down clear liquids lasting longer than 1 day, call your surgeon @ ([phone]) °Protein Shake °• Drink at least 2 ounces of shake 5-6 times per day °• Each serving of protein shakes (usually 8 - 12 ounces) should have: °o 15 grams of protein  °o And no more than 5 grams of carbohydrate  °• Goal for protein each day: °o Men = 80 grams per day °o Women = 60 grams per day °• Protein powder may be added to fluids such as non-fat milk or Lactaid milk or unsweetened Soy/Almond milk (limit to 35 grams added protein powder per serving) ° °Hydration °• Slowly increase the amount of water and other clear liquids as tolerated (See Acceptable Fluids) °• Slowly increase the amount of protein shake as tolerated  °•  Sip fluids slowly and throughout the day.  Do not use straws. °• May use sugar substitutes in small amounts (no more than 6 - 8 packets per day; i.e. Splenda) ° °Fluid Goal °• The first goal is to drink at least 8 ounces of protein shake/drink per day (or as directed by the nutritionist); some examples of protein shakes are Syntrax Nectar, Adkins Advantage, EAS Edge HP, and Unjury. See handout from pre-op Bariatric Education Class: °o Slowly increase the amount of protein shake you drink as tolerated °o You may find it easier to slowly sip shakes throughout the day °o It is important to get your proteins in first °• Your fluid goal is to drink 64 - 100 ounces of fluid daily °o It may take a few weeks to build up to this °• 32 oz (or more) should be clear liquids  °And  °• 32 oz (or more) should be full liquids (see below for examples) °• Liquids should not contain sugar, caffeine, or carbonation ° °Clear Liquids: °• Water or Sugar-free flavored water (i.e. Fruit H2O, Propel) °• Decaffeinated coffee or tea (sugar-free) °• Crystal Lite, Wyler’s Lite,  Minute Maid Lite °• Sugar-free Jell-O °• Bouillon or broth °• Sugar-free Popsicle:   *Less than 20 calories each; Limit 1 per day ° °Full Liquids: °Protein Shakes/Drinks + 2 choices per day of other full liquids °• Full liquids must be: °o No More Than 15 grams of Carbs per serving  °o No More Than 3 grams of Fat per serving °• Strained low-fat cream soup (except Cream of Potato or Tomato) °• Non-Fat milk °• Fat-free Lactaid Milk °• Unsweetened Soy Or Unsweetened Almond Milk °• Low Sugar yogurt (Dannon Lite & Fit, Greek yogurt; Oikos Triple Zero; Chobani Simply 100; Yoplait 100 calorie Greek - No Fruit on the Bottom) ° °  °Vitamins   and Minerals • Start 1 day after surgery unless otherwise directed by your surgeon °• 2 Chewable Bariatric Specific Multivitamin / Multimineral Supplement with iron (Example: Bariatric Advantage Multi EA) °• Chewable Calcium with Vitamin D-3 °(Example: 3 Chewable Calcium Plus 600 with Vitamin D-3) °o Take 500 mg three (3) times a day for a total of 1500 mg each day °o Do not take all 3 doses of calcium at one time as it may cause constipation, and you can only absorb 500 mg  at a time  °o Do not mix multivitamins containing iron with calcium supplements; take 2 hours apart °• Menstruating women and those with a history of anemia (a blood disease that causes weakness) may need extra iron °o Talk with your doctor to see if you need more iron °• Do not stop taking or change any vitamins or minerals until you talk to your dietitian or surgeon °• Your Dietitian and/or surgeon must approve all vitamin and mineral supplements °  °Activity and Exercise: Limit your physical activity as instructed by your doctor.  It is important to continue walking at home.  During this time, use these guidelines: °• Do not lift anything greater than ten (10) pounds for at least two (2) weeks °• Do not go back to work or drive until your surgeon says you can °• You may have sex when you feel comfortable  °o It is  VERY important for female patients to use a reliable birth control method; fertility often increases after surgery  °o All hormonal birth control will be ineffective for 30 days after surgery due to medications given during surgery a barrier method must be used. °o Do not get pregnant for at least 18 months °• Start exercising as soon as your doctor tells you that you can °o Make sure your doctor approves any physical activity °• Start with a simple walking program °• Walk 5-15 minutes each day, 7 days per week.  °• Slowly increase until you are walking 30-45 minutes per day °Consider joining our BELT program. [phone] or email belt@[email or web address] °  °Special Instructions Things to remember: °• Use your CPAP when sleeping if this applies to you ° °• [facility] has two free Bariatric Surgery Support Groups that meet monthly °o The 3rd Thursday of each month, 6 pm, [facility]  °o The 2nd Friday of each month, 11:45 am in the private dining room in the basement of [place] °• It is very important to keep all follow up appointments with your surgeon, dietitian, primary care physician, and behavioral health practitioner °• Routine follow up schedule with your surgeon include appointments at 2-3 weeks, 6-8 weeks, 6 months, and 1 year at a minimum.  Your surgeon may request to see you more often.   °o After the first year, please follow up with your bariatric surgeon and dietitian at least once a year in order to maintain best weight loss results °[facility]: [phone] °[facility]: [phone] °Bariatric Nurse Coordinator: [phone] °  °   Reviewed and Endorsed  °by [facility] Patient Education Committee, June, 2016 °Edits Approved: Aug, 2018 ° ° ° °

## 2019-09-29 NOTE — Progress Notes (Signed)
Discussed post op day goals with patient including ambulation, IS, diet progression, pain, and nausea control.  BSTOP education provided including BSTOP information guide, "Guide for Pain Management after your Bariatric Procedure".  Questions answered. 

## 2019-09-29 NOTE — Anesthesia Postprocedure Evaluation (Signed)
Anesthesia Post Note  Patient: MITZE DESHMUKH  Procedure(s) Performed: LAPAROSCOPIC ROUX-EN-Y GASTRIC BYPASS WITH UPPER ENDOSCOPY, ERAS Pathway (N/A Abdomen) HERNIA REPAIR UMBILICAL ADULT (N/A Abdomen)     Patient location during evaluation: PACU Anesthesia Type: General Level of consciousness: awake Pain management: pain level controlled Vital Signs Assessment: post-procedure vital signs reviewed and stable Respiratory status: spontaneous breathing Cardiovascular status: stable Postop Assessment: no apparent nausea or vomiting Anesthetic complications: no    Last Vitals:  Vitals:   09/29/19 1400 09/29/19 1414  BP: 112/68 (!) 119/56  Pulse: 78 81  Resp: 14 16  Temp:  36.6 C  SpO2: 94% 100%    Last Pain:  Vitals:   09/29/19 1501  TempSrc:   PainSc: 3                  Lalani Winkles

## 2019-09-29 NOTE — Progress Notes (Signed)
PHARMACY CONSULT FOR:  Risk Assessment for Post-Discharge VTE Following Bariatric Surgery  Post-Discharge VTE Risk Assessment: This patient's probability of 30-day post-discharge VTE is increased due to the factors marked:   Female  x  Age >/=60 years    BMI >/=50 kg/m2    CHF    Dyspnea at Rest    Paraplegia  x  Non-gastric-band surgery    Operation Time >/=3 hr    Return to OR     Length of Stay >/= 3 d      Hx of VTE   Hypercoagulable condition   Significant venous stasis   Predicted probability of 30-day post-discharge VTE: 0.31% Other patient-specific factors to consider:   Recommendation for Discharge: No pharmacologic prophylaxis post-discharge     [name] is a 63 y.o. female who underwent Laparoscopic Roux en Y gastric bypass on 09/29/19   Case start: 0805 Case end: 1100   Allergies  Allergen Reactions  . Erythromycin Nausea Only    Abdominal pain  . Tramadol Itching  . Dilaudid [Hydromorphone Hcl] Itching    Patient Measurements: Height: 5' 2.5" (158.8 cm) Weight: 226 lb (102.5 kg) IBW/kg (Calculated) : 51.25 Body mass index is 40.68 kg/m.  Recent Labs    09/29/19 0645  CREATININE 0.74  ALBUMIN 3.6  PROT 7.0  AST 25  ALT 24  ALKPHOS 105  BILITOT 0.6   Estimated Creatinine Clearance: 82.6 mL/min (by C-G formula based on SCr of 0.74 mg/dL).      Dolly Rias RPh 09/29/2019, 2:14 PM

## 2019-09-29 NOTE — Op Note (Signed)
September 29, 2019 Surgeon: Catalina Antigua B. Hassell Done, MD, FACS Asst:  Gurney Maxin, MD, FACS Anesthesia: General endotracheal Drains: None  Procedure: Laparoscopic Roux en Y gastric bypass with 40 cm BP limb and 100 cm Roux limb, antecolic, antegastric, candy cane to the left.  Closure of Peterson's defect. Upper endoscopy by Dr. Kieth Brightly.  Open repair of 2 cm umbilical hernia  Description of Procedure:  The patient was taken to OR 1 at [facility] and given general anesthesia.  The abdomen was prepped with PCMX and draped sterilely.  A time out was performed. Access achieved with Optiview through the left upper quadrant.  The umbilical hernia had contained omentum and this reduced with insufflation.  Standard ports placed and a tAP block with 30 cc Exparel was done.   The operation began by identifying the ligament of Treitz. I measured 40 cm downstream and divided the bowel with a 6 cm Covidian stapler.  I sutured a Penrose drain along the Roux limb end.  I measured a 1 meter (100 cm) Roux limb and then placed the distal bowels to the BP limb side by side and performed a stapled jejunojejunostomy. The common defect was closed from either end with 4-0 Vicryl using the Endo Stitch. The mesenteric defect was closed with a running 2-0 silk using the Endo Stitch. Tisseel was applied to the suture line.  The omentum was divided with the harmonic scalpel.  The Nathanson retractor was inserted in the left lateral segment of liver was retracted. The foregut dissection ensued.  The gastric pouch was fashioned by dividing it with the Covidien tristapler purple using no TRS on the first two firings and then using TRS.    The Roux limb was then brought up with the candycane pointed left and a back row of sutures of 2-0 Vicryl were placed. I opened along the right side of each structure and inserted the 4.5 cm stapler to create the gastrojejunostomy. The common defect was closed from either end with 2-0 Vicryl and a second row  was placed anterior to that the Ewald tube acting as a stent across the anastomosis. The Penrose drain was removed. Peterson's defect was closed with 2-0 silk.   A transverse infraumbilical incision was made and the hernia sac was freed from the overlying umbilical skin.  The sac was removed and the hernia was closed transversely with interrupted #1 Novafil.  It looked good from inside.  Endoscopy was performed by Kinsinger.   The incisions were injected with the aforementioned TAP block and were closed with 4-0 Vicryl and Dermabond.    The patient was taken to the recovery room in satisfactory condition.  Matt B. Hassell Done, MD, FACS

## 2019-09-29 NOTE — Op Note (Signed)
Preoperative diagnosis: Roux-en-Y gastric bypass  Postoperative diagnosis: Same   Procedure: Upper endoscopy   Surgeon: Gurney Maxin, M.D.  Anesthesia: Gen.   Indications for procedure: This patient was undergoing a Roux-en-Y gastric bypass.   Description of procedure: The endoscopy was placed in the mouth and into the oropharynx and under endoscopic vision it was advanced to the esophagogastric junction. The pouch was insufflated and no bleeding or bubbles were seen. The GEJ was identified at 36 cm from the teeth. The anastomosis was seen at 44 cm from the teeth. No bleeding or leaks were detected. The scope was withdrawn without difficulty.   Gurney Maxin, M.D. General, Bariatric, & Minimally Invasive Surgery [facility]

## 2019-09-30 ENCOUNTER — Encounter (HOSPITAL_COMMUNITY): Payer: Self-pay | Admitting: Surgery

## 2019-09-30 LAB — CBC WITH DIFFERENTIAL/PLATELET
Abs Immature Granulocytes: 0.07 10*3/uL (ref 0.00–0.07)
Basophils Absolute: 0 10*3/uL (ref 0.0–0.1)
Basophils Relative: 0 %
Eosinophils Absolute: 0 10*3/uL (ref 0.0–0.5)
Eosinophils Relative: 0 %
HCT: 36 % (ref 36.0–46.0)
Hemoglobin: 11.5 g/dL — ABNORMAL LOW (ref 12.0–15.0)
Immature Granulocytes: 1 %
Lymphocytes Relative: 16 %
Lymphs Abs: 1.8 10*3/uL (ref 0.7–4.0)
MCH: 30 pg (ref 26.0–34.0)
MCHC: 31.9 g/dL (ref 30.0–36.0)
MCV: 94 fL (ref 80.0–100.0)
Monocytes Absolute: 1 10*3/uL (ref 0.1–1.0)
Monocytes Relative: 9 %
Neutro Abs: 8.4 10*3/uL — ABNORMAL HIGH (ref 1.7–7.7)
Neutrophils Relative %: 74 %
Platelets: 239 10*3/uL (ref 150–400)
RBC: 3.83 MIL/uL — ABNORMAL LOW (ref 3.87–5.11)
RDW: 14.7 % (ref 11.5–15.5)
WBC: 11.3 10*3/uL — ABNORMAL HIGH (ref 4.0–10.5)
nRBC: 0 % (ref 0.0–0.2)

## 2019-09-30 MED ORDER — OXYCODONE HCL 5 MG/5ML PO SOLN
5.0000 mg | ORAL | Status: DC | PRN
  Administered 2019-09-30: 5 mg via ORAL
  Administered 2019-09-30 – 2019-10-01 (×4): 10 mg via ORAL
  Filled 2019-09-30: qty 5
  Filled 2019-09-30 (×4): qty 10

## 2019-09-30 NOTE — Progress Notes (Signed)
Patient alert and oriented, Post op day 1.  Provided support and encouragement.  Encouraged pulmonary toilet, ambulation and small sips of liquids. On last cup of water. All questions answered.  Will continue to monitor.

## 2019-09-30 NOTE — Progress Notes (Signed)
Patient ID: [name], female   DOB: September 15, 1956, 63 y.o.   MRN: [id number] [facility] Progress Note:   1 Day Post-Op  Subjective: Mental status is clear.  Doing well.   Objective: Vital signs in last 24 hours: Temp:  [97.6 F (36.4 C)-98.4 F (36.9 C)] 97.6 F (36.4 C) (11/04 0519) Pulse Rate:  [72-88] 74 (11/04 0519) Resp:  [10-19] 14 (11/04 0519) BP: (102-128)/(48-113) 108/94 (11/04 0519) SpO2:  [90 %-100 %] 99 % (11/04 0519)  Intake/Output from previous day: 11/03 0701 - 11/04 0700 In: 4119.7 [P.O.:420; I.V.:3599.7; IV Piggyback:100] Out: [id number] [Urine:1550; Blood:25] Intake/Output this shift: No intake/output data recorded.  Physical Exam: Work of breathing is normal.  Incisions sore and OK  Lab Results:  Results for orders placed or performed during the hospital encounter of 09/29/19 (from the past 48 hour(s))  Glucose, capillary     Status: Abnormal   Collection Time: 09/29/19  5:42 AM  Result Value Ref Range   Glucose-Capillary 105 (H) 70 - 99 mg/dL  Type and screen [place]     Status: None   Collection Time: 09/29/19  6:45 AM  Result Value Ref Range   ABO/RH(D) O POS    Antibody Screen NEG    Sample Expiration      10/02/2019,2359 Performed at [facility], [place] [address], [place], [place] [address]   Comprehensive metabolic panel     Status: Abnormal   Collection Time: 09/29/19  6:45 AM  Result Value Ref Range   Sodium 138 135 - 145 mmol/L   Potassium 3.2 (L) 3.5 - 5.1 mmol/L   Chloride 105 98 - 111 mmol/L   CO2 24 22 - 32 mmol/L   Glucose, Bld 96 70 - 99 mg/dL   BUN 22 8 - 23 mg/dL   Creatinine, Ser 0.74 0.44 - 1.00 mg/dL   Calcium 8.7 (L) 8.9 - 10.3 mg/dL   Total Protein 7.0 6.5 - 8.1 g/dL   Albumin 3.6 3.5 - 5.0 g/dL   AST 25 15 - 41 U/L   ALT 24 0 - 44 U/L   Alkaline Phosphatase 105 38 - 126 U/L   Total Bilirubin 0.6 0.3 - 1.2 mg/dL   GFR calc non Af Amer >60 >60 mL/min   GFR calc Af Amer >60  >60 mL/min   Anion gap 9 5 - 15    Comment: Performed at [facility], [place] [address], [place], [place] [address]  CBC     Status: Abnormal   Collection Time: 09/29/19  2:19 PM  Result Value Ref Range   WBC 15.1 (H) 4.0 - 10.5 K/uL   RBC 4.03 3.87 - 5.11 MIL/uL   Hemoglobin 12.0 12.0 - 15.0 g/dL   HCT 39.0 36.0 - 46.0 %   MCV 96.8 80.0 - 100.0 fL   MCH 29.8 26.0 - 34.0 pg   MCHC 30.8 30.0 - 36.0 g/dL   RDW 14.6 11.5 - 15.5 %   Platelets 295 150 - 400 K/uL   nRBC 0.0 0.0 - 0.2 %    Comment: Performed at [facility], [place] [address], [place], [place] [address]  Creatinine, serum     Status: None   Collection Time: 09/29/19  2:19 PM  Result Value Ref Range   Creatinine, Ser 0.88 0.44 - 1.00 mg/dL   GFR calc non Af Amer >60 >60 mL/min   GFR calc Af Amer >60 >60 mL/min  Comment: Performed at [facility], [place] [address], [place], [place] [address]  CBC WITH DIFFERENTIAL     Status: Abnormal   Collection Time: 09/30/19  3:05 AM  Result Value Ref Range   WBC 11.3 (H) 4.0 - 10.5 K/uL   RBC 3.83 (L) 3.87 - 5.11 MIL/uL   Hemoglobin 11.5 (L) 12.0 - 15.0 g/dL   HCT 36.0 36.0 - 46.0 %   MCV 94.0 80.0 - 100.0 fL   MCH 30.0 26.0 - 34.0 pg   MCHC 31.9 30.0 - 36.0 g/dL   RDW 14.7 11.5 - 15.5 %   Platelets 239 150 - 400 K/uL   nRBC 0.0 0.0 - 0.2 %   Neutrophils Relative % 74 %   Neutro Abs 8.4 (H) 1.7 - 7.7 K/uL   Lymphocytes Relative 16 %   Lymphs Abs 1.8 0.7 - 4.0 K/uL   Monocytes Relative 9 %   Monocytes Absolute 1.0 0.1 - 1.0 K/uL   Eosinophils Relative 0 %   Eosinophils Absolute 0.0 0.0 - 0.5 K/uL   Basophils Relative 0 %   Basophils Absolute 0.0 0.0 - 0.1 K/uL   Immature Granulocytes 1 %   Abs Immature Granulocytes 0.07 0.00 - 0.07 K/uL    Comment: Performed at [facility], [place] [address], [place], [place] [address]    Radiology/Results: No results found.  Anti-infectives: Anti-infectives (From  admission, onward)   Start     Dose/Rate Route Frequency Ordered Stop   09/29/19 0600  cefoTEtan (CEFOTAN) 2 g in sodium chloride 0.9 % 100 mL IVPB     2 g 200 mL/hr over 30 Minutes Intravenous On call to O.R. 09/29/19 0536 09/29/19 0800      Assessment/Plan: Problem List: Patient Active Problem List   Diagnosis Date Noted  . Gastric bypass status for obesity 09/29/2019  . Dyspnea 12/26/2017  . Restrictive lung disease secondary to obesity 12/26/2017  . Atypical chest pain 12/06/2017  . Sinus tachycardia 12/06/2017  . Chest pain 12/06/2017  . DJD (degenerative joint disease), cervical 12/24/2016  . Primary osteoarthritis of both hips 12/24/2016  . Primary osteoarthritis of both knees 12/24/2016  . H/O total knee replacement, right 12/24/2016  . Spondylosis of lumbar region without myelopathy or radiculopathy 12/24/2016  . Acute gout 05/30/2016  . Myalgia 03/29/2016  . Other long term (current) drug therapy 03/29/2016  . Idiopathic peripheral neuropathy 03/29/2016  . Cannot sleep 03/29/2016  . Migraine without aura and responsive to treatment 03/29/2016  . Multifocal myoclonus 03/29/2016  . Restless leg 03/29/2016  . Has a tremor 03/29/2016  . History of aspiration pneumonitis 01/25/2016  . History of acute bronchitis 01/25/2016  . LPRD (laryngopharyngeal reflux disease) 01/25/2016  . Imbalance 01/09/2016  . Serotonin syndrome 12/22/2015  . OA (osteoarthritis) of knee 09/05/2015  . Obesity 05/17/2015  . OSA (obstructive sleep apnea) 03/16/2013  . Iron deficiency 11/06/2012  . Thrombocythemia ([place]) 11/06/2012  . Leukocytosis 11/05/2012  . Impaired fasting glucose 07/23/2012  . Bronchitis 04/03/2012  . Kidney stone on left side 03/05/2012  . Kidney cysts 03/04/2012  . Loose stools 03/04/2012  . Urinary frequency 12/04/2011  . Restless leg syndrome 12/04/2011  . Polypharmacy 12/04/2011  . Bladder incontinence 12/04/2011  . Fibromyalgia   . Hyperlipidemia   . Edema    . S/P endometrial ablation   . Allergic rhinitis 09/18/2011  . Depression, major, single episode, in partial remission ([place]) 04/21/2011  . PRECORDIAL PAIN 02/17/2010  . Anxiety state 01/30/2010  .  Migraine 01/30/2010  . GERD 01/30/2010  . Gastroparesis 01/30/2010    Hopeful discharge later today or in AM.   1 Day Post-Op    LOS: 1 day   Matt B. Hassell Done, MD, [facility] [phone] beeper [phone]  09/30/2019 8:12 AM

## 2019-09-30 NOTE — Progress Notes (Signed)
Nutrition Brief Note  RD consulted for diet education for patient s/p bariatric surgery. While RDs are working remotely, Bariatric nurse coordinator providing education.  If nutrition issues arise, please consult RD.   Tyquisha Sharps, MS, RD, LDN Inpatient Clinical Dietitian Pager: [phone] After Hours Pager: [phone]  

## 2019-09-30 NOTE — Progress Notes (Signed)
Started protein.

## 2019-09-30 NOTE — Progress Notes (Signed)
Patient meet clear liquid goals, but unable to tolerate protein at this time.  Pain not controlled, orders from physician to increase current pain medication and change frequency  Medication.   Spouse at bedside.  Questions answered.

## 2019-10-01 LAB — CBC WITH DIFFERENTIAL/PLATELET
Abs Immature Granulocytes: 0.06 10*3/uL (ref 0.00–0.07)
Basophils Absolute: 0.1 10*3/uL (ref 0.0–0.1)
Basophils Relative: 1 %
Eosinophils Absolute: 0.2 10*3/uL (ref 0.0–0.5)
Eosinophils Relative: 2 %
HCT: 36.6 % (ref 36.0–46.0)
Hemoglobin: 11.2 g/dL — ABNORMAL LOW (ref 12.0–15.0)
Immature Granulocytes: 1 %
Lymphocytes Relative: 20 %
Lymphs Abs: 2 10*3/uL (ref 0.7–4.0)
MCH: 29.8 pg (ref 26.0–34.0)
MCHC: 30.6 g/dL (ref 30.0–36.0)
MCV: 97.3 fL (ref 80.0–100.0)
Monocytes Absolute: 1.2 10*3/uL — ABNORMAL HIGH (ref 0.1–1.0)
Monocytes Relative: 11 %
Neutro Abs: 6.6 10*3/uL (ref 1.7–7.7)
Neutrophils Relative %: 65 %
Platelets: 241 10*3/uL (ref 150–400)
RBC: 3.76 MIL/uL — ABNORMAL LOW (ref 3.87–5.11)
RDW: 15 % (ref 11.5–15.5)
WBC: 10.2 10*3/uL (ref 4.0–10.5)
nRBC: 0 % (ref 0.0–0.2)

## 2019-10-01 MED ORDER — PANTOPRAZOLE SODIUM 40 MG PO TBEC: 40.0000 mg | DELAYED_RELEASE_TABLET | Freq: Every day | ORAL | 0 refills | Status: AC

## 2019-10-01 MED ORDER — HYDROCODONE-ACETAMINOPHEN 5-325 MG PO TABS: 1.0000 | ORAL_TABLET | Freq: Four times a day (QID) | ORAL | 0 refills | Status: DC | PRN

## 2019-10-01 MED ORDER — ONDANSETRON 4 MG PO TBDP: 4.0000 mg | ORAL_TABLET | Freq: Four times a day (QID) | ORAL | 0 refills | Status: DC | PRN

## 2019-10-01 NOTE — Progress Notes (Signed)
Patient alert and oriented, Post op day 2.  Provided support and encouragement.  Encouraged pulmonary toilet, ambulation and small sips of liquids.  All questions answered.  Will continue to monitor. 

## 2019-10-01 NOTE — Progress Notes (Signed)
Discharge instructions given to patient by Bariatric nurse. Patient had no questions. NT or writer will wheel patient out once her husbands gets the car.

## 2019-10-01 NOTE — Discharge Summary (Signed)
Physician Discharge Summary  Patient ID: [name] MRN: [id number] DOB/AGE: January 30, 1956 63 y.o.  PCP: Rita Ohara, MD  Admit date: 09/29/2019 Discharge date: 10/01/2019  Admission Diagnoses:  Morbid obesity and large umbilical hernia  Discharge Diagnoses:  same  Active Problems:   Gastric bypass status for obesity   Surgery:  Lap roux en Y gastric bypass and repair of large umbilical hernia  Discharged Condition: improved  Hospital Course:   Had surgery and wasn't drinking enough to go home on PD 1.  Ready for discharge on PD 2  Consults: none  Significant Diagnostic Studies: non    Discharge Exam: Blood pressure 138/74, pulse 100, temperature 98.3 F (36.8 C), temperature source Oral, resp. rate 16, height 5' 2.5" (1.588 m), weight 102.5 kg, last menstrual period 07/27/2006, SpO2 93 %. Incisions ok  Disposition: Discharge disposition: 01-Home or Self Care       Discharge Instructions    Ambulate hourly while awake   Complete by: As directed    Call MD for:  difficulty breathing, headache or visual disturbances   Complete by: As directed    Call MD for:  persistant dizziness or light-headedness   Complete by: As directed    Call MD for:  persistant nausea and vomiting   Complete by: As directed    Call MD for:  persistant nausea and vomiting   Complete by: As directed    Call MD for:  redness, tenderness, or signs of infection (pain, swelling, redness, odor or green/yellow discharge around incision site)   Complete by: As directed    Call MD for:  redness, tenderness, or signs of infection (pain, swelling, redness, odor or green/yellow discharge around incision site)   Complete by: As directed    Call MD for:  severe uncontrolled pain   Complete by: As directed    Call MD for:  temperature >101 F   Complete by: As directed    Diet - low sodium heart healthy   Complete by: As directed    Diet bariatric full liquid   Complete by: As directed    Incentive  spirometry   Complete by: As directed    Perform hourly while awake   Increase activity slowly   Complete by: As directed      Allergies as of 10/01/2019      Reactions   Erythromycin Nausea Only   Abdominal pain   Tramadol Itching   Dilaudid [hydromorphone Hcl] Itching      Medication List    STOP taking these medications   amoxicillin 500 MG tablet Commonly known as: AMOXIL   aspirin 81 MG tablet   famotidine 40 MG tablet Commonly known as: PEPCID   prochlorperazine 10 MG tablet Commonly known as: COMPAZINE     TAKE these medications   acetaminophen 500 MG tablet Commonly known as: TYLENOL Take 1,000 mg by mouth 3 (three) times daily as needed for moderate pain.   albuterol 108 (90 Base) MCG/ACT inhaler Commonly known as: VENTOLIN HFA Inhale 1-2 puffs into the lungs every 6 (six) hours as needed for wheezing or shortness of breath.   allopurinol 300 MG tablet Commonly known as: ZYLOPRIM TAKE 1 TABLET DAILY BY MOUTH.   ALPRAZolam 0.25 MG tablet Commonly known as: XANAX Take 0.25 mg by mouth 3 (three) times daily as needed for anxiety.   ARIPiprazole 5 MG tablet Commonly known as: ABILIFY Take 5 mg by mouth daily.   Armodafinil 250 MG tablet TAKE 1 TABLET EVERY  DAY WITH BREAKFAST What changed:   how much to take  how to take this  when to take this  additional instructions   atorvastatin 40 MG tablet Commonly known as: LIPITOR TAKE 1 TABLET BY MOUTH EVERY DAY   cetirizine 10 MG tablet Commonly known as: ZYRTEC Take 10 mg by mouth at bedtime.   cholecalciferol 1000 units tablet Commonly known as: VITAMIN D Take 1,000 Units by mouth daily.   colchicine 0.6 MG tablet TAKE 1 TABLET BY MOUTH DAILY AS NEEDED DURING GOUT FLARES.   DULoxetine 60 MG capsule Commonly known as: CYMBALTA Take 60 mg by mouth 2 (two) times daily.   Fish Oil 1200 MG Caps Take 1,200 mg by mouth daily.   fluticasone 50 MCG/ACT nasal spray Commonly known as:  FLONASE PLACE 2 SPRAYS INTO BOTH NOSTRILS DAILY AS NEEDED FOR ALLERGIES What changed: See the new instructions.   gabapentin 300 MG capsule Commonly known as: NEURONTIN Take 1 capsule (300 mg total) by mouth 3 (three) times daily. Gabapentin 300 mg Protocol Take a 300 mg capsule three times a day for two weeks following surgery. Then take a 300 mg capsule two times a day for two weeks.  Then resume a 300 mg capsule once a day at bedtime. What changed: additional instructions   HYDROcodone-acetaminophen 5-325 MG tablet Commonly known as: NORCO/VICODIN Take 1 tablet by mouth every 6 (six) hours as needed for moderate pain.   ICaps Areds 2 Caps Take 1 capsule by mouth 2 (two) times daily.   meclizine 25 MG tablet Commonly known as: ANTIVERT Take 1 tablet (25 mg total) by mouth 3 (three) times daily as needed for dizziness.   Melatonin 10 MG Tabs Take 10 mg by mouth at bedtime.   methocarbamol 500 MG tablet Commonly known as: ROBAXIN TAKE 1 TABLET (500 MG TOTAL) BY MOUTH 2 (TWO) TIMES DAILY AS NEEDED FOR MUSCLE SPASMS.   metoprolol tartrate 50 MG tablet Commonly known as: LOPRESSOR Take 1 tablet (50 mg total) by mouth 2 (two) times daily. Notes to patient: Monitor Blood Pressure Daily and keep a log for primary care physician.  You may need to make changes to your medications with rapid weight loss.     nortriptyline 50 MG capsule Commonly known as: PAMELOR Take 150 mg by mouth at bedtime.   ondansetron 4 MG disintegrating tablet Commonly known as: ZOFRAN-ODT Take 1 tablet (4 mg total) by mouth every 6 (six) hours as needed for nausea or vomiting.   ondansetron 8 MG tablet Commonly known as: ZOFRAN Take 8 mg by mouth every 8 (eight) hours as needed for nausea.   pantoprazole 40 MG tablet Commonly known as: PROTONIX Take 1 tablet (40 mg total) by mouth daily. What changed: You were already taking a medication with the same name, and this prescription was added. Make sure  you understand how and when to take each.   pantoprazole 40 MG tablet Commonly known as: PROTONIX Take 40 mg by mouth 2 (two) times daily. What changed: Another medication with the same name was added. Make sure you understand how and when to take each.   [facility] Colon Health Caps Take 1 capsule by mouth at bedtime.   Systane Complete 0.6 % Soln Generic drug: Propylene Glycol Place 1 drop into both eyes daily at 12 noon.   topiramate 25 MG tablet Commonly known as: TOPAMAX TAKE 2 TABLETS (50 MG TOTAL) BY MOUTH AT BEDTIME What changed: See the new instructions.   vitamin B-12  1000 MCG tablet Commonly known as: CYANOCOBALAMIN Take 1,000 mcg by mouth daily.   Xiidra 5 % Soln Generic drug: Lifitegrast Apply 1 drop to eye 2 (two) times daily.      Follow-up Information    [facility]. Go on 10/28/2019.   Specialty: General Surgery Why: at 915 with Dr Kaylyn Lim Contact information: [place] [address] [phone]        Carlena Hurl, PA-C. Go on 11/17/2019.   Specialty: General Surgery Contact information: [place] [address] [phone]           Signed: Pedro Earls 10/01/2019, 1:14 PM

## 2019-10-01 NOTE — Progress Notes (Signed)
Patient alert and oriented, pain is controlled. Patient is tolerating fluids, advanced to protein shake today, patient is tolerating well. Reviewed Gastric Bypass discharge instructions with patient and patient is able to articulate understanding. Provided information on BELT program, Support Group and [facility] outpatient pharmacy. All questions answered, will continue to monitor.   Total fluid intake 600 Call back for hydration Monday 11/9

## 2019-10-05 ENCOUNTER — Telehealth (HOSPITAL_COMMUNITY): Payer: Self-pay | Admitting: *Deleted

## 2019-10-05 NOTE — Telephone Encounter (Signed)
Patient called to discuss post bariatric surgery follow up questions.  See below:   1.  Tell me about your pain and pain management? "little bit of discomfort"- not much pain. Had only 2 pain pills since being home and 3 Tylenols  2.  Let's talk about fluid intake.  How much total fluid are you taking in?  Had 57 1/2 oz of fluid yesterday. (Premier 22oz, water, gatorade 0, 2 sugar free popsicles).  Hope to get in between that and 64 oz today  3.  How much protein have you taken in the last 2 days? Intake of 2 cartons of Premier- 60 gms  4.  Have you had nausea?  Tell me about when have experienced nausea and what you did to help? A couple bouts of nausea-had 3 pills since being home; much better than hospital  5.  Has the frequency or color changed with your urine? Urine is pale yellow  6.  Tell me what your incisions look like? The hernia incision is a little red- not puffy; others look fine ( one bled at hospital and had to have a binder)  7.  Have you been passing gas? BM? Yes gas but no BM in 2 days; had diarrhea at hospital  8.  If a problem or question were to arise who would you call?  Do you know contact numbers for [facility], [facility], and [facility]? "Yes I have those numbers"  9.  How has the walking going? Walking in house and in driveway.  Went to see mother and did errands today.  Moving every 1-2 hours  10.  How are your vitamins and calcium going?  How are you taking them? Taking my chewable vitamins twice daily and Ca three times daily

## 2019-10-13 ENCOUNTER — Encounter: Payer: Medicare HMO | Attending: Surgery | Admitting: Skilled Nursing Facility1

## 2019-10-13 ENCOUNTER — Other Ambulatory Visit: Payer: Self-pay

## 2019-10-13 DIAGNOSIS — Z713 Dietary counseling and surveillance: Secondary | ICD-10-CM | POA: Insufficient documentation

## 2019-10-13 DIAGNOSIS — E669 Obesity, unspecified: Secondary | ICD-10-CM

## 2019-10-13 DIAGNOSIS — Z9884 Bariatric surgery status: Secondary | ICD-10-CM | POA: Diagnosis not present

## 2019-10-14 NOTE — Progress Notes (Signed)
2 Week Post-Operative Nutrition Class   Patient was seen on 01/20/19 for Post-Operative Nutrition education at the [facility].    Surgery date: 10/13/2019 Surgery type: RYGB Start weight at [facility]: 2258 Weight today: 212.3   Body Composition Scale 10/13/2019  Total Body Fat % 44.7  Visceral Fat 17  Fat-Free Mass % 55.2   Total Body Water % 42.1   Muscle-Mass lbs 27.5  Body Fat Displacement          Torso  lbs 58.8         Left Leg  lbs 11.7         Right Leg  lbs 11.7         Left Arm  lbs 5.8         Right Arm   lbs 5.8     The following the learning objectives were met by the patient during this course:  Identifies Phase 3 (Soft, High Proteins) Dietary Goals and will begin from 2 weeks post-operatively to 2 months post-operatively  Identifies appropriate sources of fluids and proteins   States protein recommendations and appropriate sources post-operatively  Identifies the need for appropriate texture modifications, mastication, and bite sizes when consuming solids  Identifies appropriate multivitamin and calcium sources post-operatively  Describes the need for physical activity post-operatively and will follow MD recommendations  States when to call healthcare provider regarding medication questions or post-operative complications   Handouts given during class include:  Phase 3A: Soft, High Protein Diet Handout   Follow-Up Plan: Patient will follow-up at [facility] in 6 weeks for 2 month post-op nutrition visit for diet advancement per MD.

## 2019-10-20 ENCOUNTER — Telehealth: Payer: Self-pay | Admitting: Skilled Nursing Facility1

## 2019-10-20 NOTE — Telephone Encounter (Signed)
RD called pt to verify fluid intake once starting soft, solid proteins 2 week post-bariatric surgery.   Daily Fluid intake: Daily Protein intake:  Concerns/issues:   LVM 

## 2019-10-24 NOTE — Progress Notes (Signed)
Start time: 11:43 End time: 12:11  Virtual Visit via Video Note  I connected with [name] on 10/26/2019 by a video enabled telemedicine application and verified that I am speaking with the correct person using two identifiers.  Location: Patient: home Provider: office   I discussed the limitations of evaluation and management by telemedicine and the availability of in person appointments. The patient expressed understanding and agreed to proceed.  History of Present Illness:  Chief Complaint  Patient presents with  . Med check    VIRTUAL med check. No new concerns.    Patient presents for 6 month med check.  Doing visit virtually due to COVID-19 pandemic, and she has been seen in person recently by other providers.  She is s/p Roux-En-Y bariatric surgery on 09/29/2019.  She is followed closely by nutritionist. She sees surgeon again on Wednesday of this week.  Pre-diabetes and obesity:She had Roux-En-Y on 11/3. She reports trying to cut back on shakes, but still has some, trying to get enough protein. Has only vomited one time (last night, when had chest pain/reflux, then felt better).   Lab Results  Component Value Date   HGBA1C 6.2 (H) 04/13/2019   Hyperlipidemia follow-up: Patient is trying to follow a low-fat, low cholesterol diet as best she can. Compliant with medications (atorvastatin 36m) and denies medication side effects. She takes fish oil, 1 daily. Lab Results  Component Value Date   CHOL 128 10/09/2018   HDL 42 10/09/2018   LDLCALC 62 10/09/2018   TRIG 119 10/09/2018   CHOLHDL 3.0 10/09/2018   Hypertension: (never formally diagnosed, because hadbeen on diuretics for fluid retention (stopped a while ago) and beta blockers fortachycardia andtremor--but high BP's noted when not taking these medications in past).  She continues on metoprolol 529mBID.  She periodically checks her BP at home, running 116-125/70-80 range. Denies chest pain,  palpitations/tachycardia. She had some pain in her chest for the first time last night, thinks was reflux.  PPI dose was previously BID (prior to surgery). Takes PPI at 9pm currently. She vomited and felt better afterwards.  She is under the care of Dr. DeEstanislado Pandyforfibromyalgia, osteoarthritis, degenerative disc disease, chronic fatigue and gout.  She prescribes armodafinil (Nuvigil) for her chronic fatigue, and is doing well overall.  She last saw her in August 2020.  At that time she got trigger point injections in her neck. She gets a lot of spasm and muscle tension related to posture. She continues on cymbalta, gabapentin, and robaxin 50080mID prn. She also prescribes her armodafinil for her chronic fatigue. She had gotten PT for h/o falls, and hasn't had further falls. Surgeons stopped her methocarbamol hasn't had a lot of muscle pain/spasm.  Gout--she continues on allopurinol 300m23mily, and hasn't had any flares.  Dr. DeveEstanislado Pandyilled colchicine to have on hand prn flares.--looks like it was refilled in August, Sept and October for #30. She is NOT taking these daily, hasn't taken in a very long time, not needing. Lab Results  Component Value Date   LABURIC 4.7 10/09/2018   She has OSA, under the care of Dr. TurnRadford Paxe is doing well on her CPAP. Energy is improved, but still requires Nuvigil.  She saw neurologist (Dr. TonuValaria Good October 2020. She is ongabapentin for restless legs as well as her neuropathy pain. Also on clonazepam for RLS.  On beta blocker for tremor. On topamax and pamelor for migraine prevention.  She sees him for migraines, tremor (benign  essential vs accentuated physiological tremor), neuropathy (pedal paresthesias), and restless leg syndrome. He has also noted episodic asterixis and transient R hand paresthesias, possible median neuropathy.  She gets migrainesevery 3 months or less, can't recall the last one. They are relieved by OTC medication most of the  time. She is having more headaches, not migraines.  Relieved by Tylenol. Sometimes they are sinus headaches.  Depression:Sees Regina at [facility], and is doing well on Cymbalta and Abilify.  She has had dyspnea on exertion since her knee surgery in August, 2019.It is episodic.She has seen cardiologist and pulmonologist.  Dr. Halford Chessman felt that given that extensive testing was unrevealing for a cause (cardiac/pulmonary), symptoms may be related to obesity and deconditioning. She had no benefit from Qvar trial. She still reports some SOB.  She is currently supposed to be walking some, until she gets released for exercise.  She is going to try a bariatric exercise program through [facility]. It is 6:30-7:30 am, 3x/week.  GERD and gastroparesis is stable. She last saw GI on 09/17/2019. GERD was controlled with her PPI BID.  Dose was decreased to once daily after her surgery. Was doing fine until last night. History of gastroparesis--her last gastric emptying study was normal. Last night was the first time since her surgery that she vomited   PMH, PSH, SH reviewed  Outpatient Encounter Medications as of 10/26/2019  Medication Sig Note  . allopurinol (ZYLOPRIM) 300 MG tablet TAKE 1 TABLET DAILY BY MOUTH. (Patient taking differently: Take 300 mg by mouth daily. )   . ALPRAZolam (XANAX) 0.25 MG tablet Take 0.25 mg by mouth 3 (three) times daily as needed for anxiety. 10/26/2019: Uses prn, most nights  . ARIPiprazole (ABILIFY) 5 MG tablet Take 5 mg by mouth daily.   . Armodafinil 250 MG tablet TAKE 1 TABLET EVERY DAY WITH BREAKFAST (Patient taking differently: Take 250 mg by mouth daily. )   . atorvastatin (LIPITOR) 40 MG tablet TAKE 1 TABLET BY MOUTH EVERY DAY (Patient taking differently: Take 40 mg by mouth daily. )   . Calcium-Vitamin D-Vitamin K (CALCIUM SOFT CHEWS PO) Take 1,500 mg by mouth daily. 10/26/2019: Takes TID  . cetirizine (ZYRTEC) 10 MG tablet Take 10 mg by mouth at bedtime.   .  DULoxetine (CYMBALTA) 60 MG capsule Take 60 mg by mouth 2 (two) times daily.    . fluticasone (FLONASE) 50 MCG/ACT nasal spray PLACE 2 SPRAYS INTO BOTH NOSTRILS DAILY AS NEEDED FOR ALLERGIES (Patient taking differently: Place 2 sprays into both nostrils at bedtime. )   . gabapentin (NEURONTIN) 300 MG capsule Take 1 capsule (300 mg total) by mouth 3 (three) times daily. Gabapentin 300 mg Protocol Take a 300 mg capsule three times a day for two weeks following surgery. Then take a 300 mg capsule two times a day for two weeks.  Then resume a 300 mg capsule once a day at bedtime. (Patient taking differently: Take 300 mg by mouth 3 (three) times daily. ) 10/26/2019: Taking TID  . HYDROcodone-acetaminophen (NORCO/VICODIN) 5-325 MG tablet Take 1 tablet by mouth every 6 (six) hours as needed for moderate pain. 10/26/2019: Using prn for recent dentalwork  . Melatonin 10 MG TABS Take 10 mg by mouth at bedtime.   . metoprolol tartrate (LOPRESSOR) 50 MG tablet Take 1 tablet (50 mg total) by mouth 2 (two) times daily.   . Multiple Vitamins-Minerals (BARIATRIC MULTIVITAMINS/IRON PO) Take 2 each by mouth daily.   . Multiple Vitamins-Minerals (ICAPS AREDS 2) CAPS Take 1  capsule by mouth 2 (two) times daily.   . nortriptyline (PAMELOR) 50 MG capsule Take 150 mg by mouth at bedtime.   . ondansetron (ZOFRAN) 8 MG tablet Take 8 mg by mouth every 8 (eight) hours as needed for nausea.    . pantoprazole (PROTONIX) 40 MG tablet Take 1 tablet (40 mg total) by mouth daily.   . Probiotic Product ([place]) CAPS Take 1 capsule by mouth at bedtime.    Marland Kitchen Propylene Glycol (SYSTANE COMPLETE) 0.6 % SOLN Place 1 drop into both eyes daily at 12 noon.    . topiramate (TOPAMAX) 25 MG tablet TAKE 2 TABLETS (50 MG TOTAL) BY MOUTH AT BEDTIME (Patient taking differently: Take 50 mg by mouth daily. )   . XIIDRA 5 % SOLN Apply 1 drop to eye 2 (two) times daily.   . [DISCONTINUED] cholecalciferol (VITAMIN D) 1000 units tablet  Take 1,000 Units by mouth daily.   . [DISCONTINUED] Omega-3 Fatty Acids (FISH OIL) 1200 MG CAPS Take 1,200 mg by mouth daily.   Marland Kitchen acetaminophen (TYLENOL) 500 MG tablet Take 1,000 mg by mouth 3 (three) times daily as needed for moderate pain. 10/26/2019: Uses prn pain, hasn't taken today  . albuterol (PROVENTIL HFA;VENTOLIN HFA) 108 (90 Base) MCG/ACT inhaler Inhale 1-2 puffs into the lungs every 6 (six) hours as needed for wheezing or shortness of breath. (Patient not taking: Reported on 10/26/2019)   . colchicine 0.6 MG tablet TAKE 1 TABLET BY MOUTH DAILY AS NEEDED DURING GOUT FLARES. (Patient not taking: Reported on 10/26/2019)   . meclizine (ANTIVERT) 25 MG tablet Take 1 tablet (25 mg total) by mouth 3 (three) times daily as needed for dizziness. (Patient not taking: Reported on 10/26/2019)   . methocarbamol (ROBAXIN) 500 MG tablet TAKE 1 TABLET (500 MG TOTAL) BY MOUTH 2 (TWO) TIMES DAILY AS NEEDED FOR MUSCLE SPASMS. (Patient not taking: Reported on 10/26/2019) 10/26/2019: Stopped by surgeons  . [DISCONTINUED] carbamazepine (TEGRETOL XR) 100 MG 12 hr tablet Take 200 mg by mouth daily.     . [DISCONTINUED] Eszopiclone 3 MG TABS Take 3 mg by mouth at bedtime. Take immediately before bedtime   . [DISCONTINUED] ondansetron (ZOFRAN-ODT) 4 MG disintegrating tablet Take 1 tablet (4 mg total) by mouth every 6 (six) hours as needed for nausea or vomiting.   . [DISCONTINUED] pantoprazole (PROTONIX) 40 MG tablet Take 40 mg by mouth 2 (two) times daily.   . [DISCONTINUED] vitamin B-12 (CYANOCOBALAMIN) 1000 MCG tablet Take 1,000 mcg by mouth daily.    No facility-administered encounter medications on file as of 10/26/2019.    Allergies  Allergen Reactions  . Erythromycin Nausea Only    Abdominal pain  . Tramadol Itching  . Dilaudid [Hydromorphone Hcl] Itching   ROS: no fever, chills, URI symptoms, cough.  Breathing at baseline per HPI, some DOE unchanged.  Headaches per HPI (rare migraine, other HA  relieved by tylenol).   Restless legs a little worse. Vomited last night, occasional nausea.   Sometimes feels a little lightheaded, knows she isn't drinking enough liquids. See HPI   Observations/Objective:  BP 104/66   Pulse 81   Ht 5' 2.5" (1.588 m)   Wt 205 lb (93 kg)   LMP 07/27/2006   BMI 36.90 kg/m   Wt Readings from Last 3 Encounters:  10/26/19 205 lb (93 kg)  10/14/19 212 lb 4.8 oz (96.3 kg)  09/29/19 226 lb (102.5 kg)   Well-appearing, pleasant female, in good spirits. Video was on for  just a few minutes--normal eye contact, speech, cranial nerves grossly intact. Normal mood, affect, grooming Exam was limited due to virtual nature of the visit.   Assessment and Plan:  History of Roux-en-Y gastric bypass - recent; doing well.  Has f/u Wed with Psychologist, sport and exercise. Need to see if/what labs are done before ordering/scheduling fasting lab visit  Pre-diabetes - suspect this will improve as she continues to lose weight.  Mixed hyperlipidemia - cont statin, lowfat, low cholesterol diet  Fibromyalgia - stable on current regimen. f/b Dr. Estanislado Pandy  OSA (obstructive sleep apnea) - compliant with CPAP  Idiopathic peripheral neuropathy - controlled on current regimen  Migraine without status migrainosus, not intractable, unspecified migraine type - doing well on pamelor and topamax prophylactic meds  Acute gout of right foot, unspecified cause - no flares, continue allopurinol. Due for uric acid check  Gastroesophageal reflux disease without esophagitis - stable. PPI dose is now just once daily.  Will let MD's know if reflux isn't adequately controlled  Restless leg syndrome - a little worse recently. Encouraged her to drink more fluids.  May need CBC/ferritin checked   A1c, Uric acid, c-met, lipids are due. Consider CBC, ferritin since RLS worse  GET NOTES/LAB RESULTS FROM [facility] (Wed visit this week) TO DETERMINE WHICH FASTING LABS ARE NEEDED THROUGH OUR OFFICE.   Follow Up  Instructions:    I discussed the assessment and treatment plan with the patient. The patient was provided an opportunity to ask questions and all were answered. The patient agreed with the plan and demonstrated an understanding of the instructions.   The patient was advised to call back or seek an in-person evaluation if the symptoms worsen or if the condition fails to improve as anticipated.  I provided 28 minutes of non-face-to-face time during this encounter.   Vikki Ports, MD

## 2019-10-26 ENCOUNTER — Ambulatory Visit (INDEPENDENT_AMBULATORY_CARE_PROVIDER_SITE_OTHER): Payer: Medicare HMO | Admitting: Family Medicine

## 2019-10-26 ENCOUNTER — Encounter: Payer: Medicare HMO | Admitting: Family Medicine

## 2019-10-26 ENCOUNTER — Encounter: Payer: Self-pay | Admitting: Family Medicine

## 2019-10-26 ENCOUNTER — Other Ambulatory Visit: Payer: Self-pay

## 2019-10-26 VITALS — BP 104/66 | HR 81 | Ht 62.5 in | Wt 205.0 lb

## 2019-10-26 DIAGNOSIS — M797 Fibromyalgia: Secondary | ICD-10-CM

## 2019-10-26 DIAGNOSIS — K219 Gastro-esophageal reflux disease without esophagitis: Secondary | ICD-10-CM

## 2019-10-26 DIAGNOSIS — M109 Gout, unspecified: Secondary | ICD-10-CM

## 2019-10-26 DIAGNOSIS — R7303 Prediabetes: Secondary | ICD-10-CM | POA: Diagnosis not present

## 2019-10-26 DIAGNOSIS — Z9884 Bariatric surgery status: Secondary | ICD-10-CM | POA: Diagnosis not present

## 2019-10-26 DIAGNOSIS — G609 Hereditary and idiopathic neuropathy, unspecified: Secondary | ICD-10-CM | POA: Diagnosis not present

## 2019-10-26 DIAGNOSIS — G43909 Migraine, unspecified, not intractable, without status migrainosus: Secondary | ICD-10-CM

## 2019-10-26 DIAGNOSIS — G2581 Restless legs syndrome: Secondary | ICD-10-CM

## 2019-10-26 DIAGNOSIS — E782 Mixed hyperlipidemia: Secondary | ICD-10-CM

## 2019-10-26 DIAGNOSIS — G4733 Obstructive sleep apnea (adult) (pediatric): Secondary | ICD-10-CM | POA: Diagnosis not present

## 2019-10-26 NOTE — Patient Instructions (Signed)
You are due for check of your lipid panel, A1c, uric acid and chem panel. I'm not sure what labs (if any) the surgeons will be checking at your visit on Wednesday. If they do not do anything, please contact us and we will schedule you for a fasting lab visit. If they give you a prescription of labs needed, we can draw them all for you and send them the results. If you get labs done there ,let us know--we will try and get the results so that we don't duplicate any orders.

## 2019-10-27 NOTE — Progress Notes (Signed)
done

## 2019-10-28 ENCOUNTER — Telehealth: Payer: Self-pay | Admitting: Family Medicine

## 2019-10-28 DIAGNOSIS — G2581 Restless legs syndrome: Secondary | ICD-10-CM

## 2019-10-28 DIAGNOSIS — Z5181 Encounter for therapeutic drug level monitoring: Secondary | ICD-10-CM

## 2019-10-28 DIAGNOSIS — M109 Gout, unspecified: Secondary | ICD-10-CM

## 2019-10-28 DIAGNOSIS — R7303 Prediabetes: Secondary | ICD-10-CM

## 2019-10-28 DIAGNOSIS — E782 Mixed hyperlipidemia: Secondary | ICD-10-CM

## 2019-10-28 NOTE — Telephone Encounter (Signed)
Orders entered. Needs lab visit scheduled please (fasting)

## 2019-10-28 NOTE — Telephone Encounter (Signed)
Done

## 2019-10-28 NOTE — Telephone Encounter (Signed)
Pt called and states that she went to specialist today and he did not drawn any labs. She will need to come in for those. Please place orders and advise pt at [phone]

## 2019-11-02 ENCOUNTER — Other Ambulatory Visit: Payer: Self-pay

## 2019-11-02 ENCOUNTER — Other Ambulatory Visit: Payer: Medicare HMO

## 2019-11-02 DIAGNOSIS — R7303 Prediabetes: Secondary | ICD-10-CM

## 2019-11-02 DIAGNOSIS — E785 Hyperlipidemia, unspecified: Secondary | ICD-10-CM | POA: Diagnosis not present

## 2019-11-02 DIAGNOSIS — Z5181 Encounter for therapeutic drug level monitoring: Secondary | ICD-10-CM | POA: Diagnosis not present

## 2019-11-02 DIAGNOSIS — M109 Gout, unspecified: Secondary | ICD-10-CM | POA: Diagnosis not present

## 2019-11-02 DIAGNOSIS — E782 Mixed hyperlipidemia: Secondary | ICD-10-CM

## 2019-11-02 DIAGNOSIS — G2581 Restless legs syndrome: Secondary | ICD-10-CM | POA: Diagnosis not present

## 2019-11-03 LAB — CBC WITH DIFFERENTIAL/PLATELET
Basophils Absolute: 0.1 10*3/uL (ref 0.0–0.2)
Basos: 1 %
EOS (ABSOLUTE): 0.4 10*3/uL (ref 0.0–0.4)
Eos: 4 %
Hematocrit: 40.7 % (ref 34.0–46.6)
Hemoglobin: 13.2 g/dL (ref 11.1–15.9)
Immature Grans (Abs): 0 10*3/uL (ref 0.0–0.1)
Immature Granulocytes: 0 %
Lymphocytes Absolute: 2.8 10*3/uL (ref 0.7–3.1)
Lymphs: 30 %
MCH: 29.5 pg (ref 26.6–33.0)
MCHC: 32.4 g/dL (ref 31.5–35.7)
MCV: 91 fL (ref 79–97)
Monocytes Absolute: 1.1 10*3/uL — ABNORMAL HIGH (ref 0.1–0.9)
Monocytes: 12 %
Neutrophils Absolute: 5.2 10*3/uL (ref 1.4–7.0)
Neutrophils: 53 %
Platelets: 244 10*3/uL (ref 150–450)
RBC: 4.47 x10E6/uL (ref 3.77–5.28)
RDW: 15 % (ref 11.7–15.4)
WBC: 9.6 10*3/uL (ref 3.4–10.8)

## 2019-11-03 LAB — COMPREHENSIVE METABOLIC PANEL
ALT: 31 IU/L (ref 0–32)
AST: 31 IU/L (ref 0–40)
Albumin/Globulin Ratio: 1.5 (ref 1.2–2.2)
Albumin: 3.9 g/dL (ref 3.8–4.8)
Alkaline Phosphatase: 121 IU/L — ABNORMAL HIGH (ref 39–117)
BUN/Creatinine Ratio: 24 (ref 12–28)
BUN: 19 mg/dL (ref 8–27)
Bilirubin Total: 0.2 mg/dL (ref 0.0–1.2)
CO2: 24 mmol/L (ref 20–29)
Calcium: 9 mg/dL (ref 8.7–10.3)
Chloride: 103 mmol/L (ref 96–106)
Creatinine, Ser: 0.79 mg/dL (ref 0.57–1.00)
GFR calc Af Amer: 93 mL/min/{1.73_m2} (ref >59)
GFR calc non Af Amer: 80 mL/min/{1.73_m2} (ref >59)
Globulin, Total: 2.6 g/dL (ref 1.5–4.5)
Glucose: 82 mg/dL (ref 65–99)
Potassium: 3.9 mmol/L (ref 3.5–5.2)
Sodium: 144 mmol/L (ref 134–144)
Total Protein: 6.5 g/dL (ref 6.0–8.5)

## 2019-11-03 LAB — HEMOGLOBIN A1C
Est. average glucose Bld gHb Est-mCnc: 111 mg/dL
Hgb A1c MFr Bld: 5.5 % (ref 4.8–5.6)

## 2019-11-03 LAB — LIPID PANEL
Chol/HDL Ratio: 2.8 ratio (ref 0.0–4.4)
Cholesterol, Total: 105 mg/dL (ref 100–199)
HDL: 37 mg/dL — ABNORMAL LOW (ref >39)
LDL Chol Calc (NIH): 42 mg/dL (ref 0–99)
Triglycerides: 156 mg/dL — ABNORMAL HIGH (ref 0–149)
VLDL Cholesterol Cal: 26 mg/dL (ref 5–40)

## 2019-11-03 LAB — URIC ACID: Uric Acid: 5.8 mg/dL (ref 3.0–7.2)

## 2019-11-03 LAB — FERRITIN: Ferritin: 65 ng/mL (ref 15–150)

## 2019-11-09 ENCOUNTER — Encounter: Payer: Self-pay | Admitting: Adult Health

## 2019-11-09 ENCOUNTER — Other Ambulatory Visit: Payer: Self-pay

## 2019-11-09 ENCOUNTER — Ambulatory Visit (INDEPENDENT_AMBULATORY_CARE_PROVIDER_SITE_OTHER): Payer: Medicare HMO | Admitting: Adult Health

## 2019-11-09 DIAGNOSIS — F411 Generalized anxiety disorder: Secondary | ICD-10-CM | POA: Diagnosis not present

## 2019-11-09 DIAGNOSIS — G4700 Insomnia, unspecified: Secondary | ICD-10-CM

## 2019-11-09 DIAGNOSIS — F3181 Bipolar II disorder: Secondary | ICD-10-CM

## 2019-11-09 DIAGNOSIS — F331 Major depressive disorder, recurrent, moderate: Secondary | ICD-10-CM

## 2019-11-09 DIAGNOSIS — R69 Illness, unspecified: Secondary | ICD-10-CM | POA: Diagnosis not present

## 2019-11-09 MED ORDER — ARIPIPRAZOLE 5 MG PO TABS: 5.0000 mg | ORAL_TABLET | Freq: Every day | ORAL | 1 refills | Status: DC

## 2019-11-09 MED ORDER — ALPRAZOLAM 0.25 MG PO TABS: 0.2500 mg | ORAL_TABLET | Freq: Three times a day (TID) | ORAL | 2 refills | Status: DC | PRN

## 2019-11-09 MED ORDER — DULOXETINE HCL 60 MG PO CPEP: 60.0000 mg | ORAL_CAPSULE | Freq: Two times a day (BID) | ORAL | 1 refills | Status: DC

## 2019-11-09 NOTE — Progress Notes (Signed)
CHENOA WARNECKE [id number] 09-Mar-1956 63 y.o.  Subjective:   Patient ID:  [name] is a 63 y.o. (DOB 1956/09/13) female.  Chief Complaint: No chief complaint on file.   HPI [name] presents to the office today for follow-up of anxiety, depression, and insomnia.  Describes mood today as "ok". Pleasant. Mood symptoms - reports depression, anxiety, and irritability "at times". Gastric bypass November 3rd - went "we''". Had a "bad tooth" that had to be extracted recently - "lot of trouble with the pain" - "dry socket". Mother having issues - age 60 - having a colonoscopy with a blockage. Stating "there is a possibility she has cancer". Stating "I'm blessed to have her this long". Sister had to go to "behavioral health". Sister under a lot  of stress - worked at the same place for 27 years and then resigned before she was fired. Her husband has been in the [facility] hospital - "then she got Covid-19". . Stable interest and motivation. Taking medications as prescribed.  Energy levels stable. Active, does not have a regular exercise routine.  Enjoys some usual interests and activities. Married. Lives at home. Spending time with family. Appetite adequate. Weight loss.  Sleeps better some nights than others - "taking forever to get to sleep". Averages 6 to 8 hours. Focus and concentration stable. Completing tasks. Managing aspects of household.  Denies SI or HI. Denies AH or VH.  PHQ2-9     Office Visit from 04/15/2019 in [place] from [facility] Nutrition from 05/06/2018 in [facility] Office Visit from 04/09/2018 in [place] from 07/31/2017 in [place]  PHQ-2 Total Score  0  0  0  0  2  PHQ-9 Total Score  --  --  --  --  9      Review of Systems:  Review of Systems  Musculoskeletal: Negative for gait problem.  Neurological: Negative for tremors.   Psychiatric/Behavioral:       Please refer to HPI    Medications: I have reviewed the patient's current medications.  Current Outpatient Medications  Medication Sig Dispense Refill  . acetaminophen (TYLENOL) 500 MG tablet Take 1,000 mg by mouth 3 (three) times daily as needed for moderate pain.    Marland Kitchen albuterol (PROVENTIL HFA;VENTOLIN HFA) 108 (90 Base) MCG/ACT inhaler Inhale 1-2 puffs into the lungs every 6 (six) hours as needed for wheezing or shortness of breath. (Patient not taking: Reported on 10/26/2019) 1 Inhaler 0  . allopurinol (ZYLOPRIM) 300 MG tablet TAKE 1 TABLET DAILY BY MOUTH. (Patient taking differently: Take 300 mg by mouth daily. ) 90 tablet 0  . ALPRAZolam (XANAX) 0.25 MG tablet Take 0.25 mg by mouth 3 (three) times daily as needed for anxiety.    . ARIPiprazole (ABILIFY) 5 MG tablet Take 5 mg by mouth daily.  5  . Armodafinil 250 MG tablet TAKE 1 TABLET EVERY DAY WITH BREAKFAST (Patient taking differently: Take 250 mg by mouth daily. ) 30 tablet 2  . atorvastatin (LIPITOR) 40 MG tablet TAKE 1 TABLET BY MOUTH EVERY DAY (Patient taking differently: Take 40 mg by mouth daily. ) 90 tablet 0  . Calcium-Vitamin D-Vitamin K (CALCIUM SOFT CHEWS PO) Take 1,500 mg by mouth daily.    . cetirizine (ZYRTEC) 10 MG tablet Take 10 mg by mouth at bedtime.    . colchicine 0.6 MG tablet TAKE 1 TABLET BY MOUTH DAILY AS NEEDED  DURING GOUT FLARES. (Patient not taking: Reported on 10/26/2019) 30 tablet 0  . DULoxetine (CYMBALTA) 60 MG capsule Take 60 mg by mouth 2 (two) times daily.     . fluticasone (FLONASE) 50 MCG/ACT nasal spray PLACE 2 SPRAYS INTO BOTH NOSTRILS DAILY AS NEEDED FOR ALLERGIES (Patient taking differently: Place 2 sprays into both nostrils at bedtime. ) 16 g 1  . gabapentin (NEURONTIN) 300 MG capsule Take 1 capsule (300 mg total) by mouth 3 (three) times daily. Gabapentin 300 mg Protocol Take a 300 mg capsule three times a day for two weeks following surgery. Then take a 300 mg  capsule two times a day for two weeks.  Then resume a 300 mg capsule once a day at bedtime. (Patient taking differently: Take 300 mg by mouth 3 (three) times daily. ) 84 capsule 0  . HYDROcodone-acetaminophen (NORCO/VICODIN) 5-325 MG tablet Take 1 tablet by mouth every 6 (six) hours as needed for moderate pain. 15 tablet 0  . meclizine (ANTIVERT) 25 MG tablet Take 1 tablet (25 mg total) by mouth 3 (three) times daily as needed for dizziness. (Patient not taking: Reported on 10/26/2019) 30 tablet 0  . Melatonin 10 MG TABS Take 10 mg by mouth at bedtime.    . methocarbamol (ROBAXIN) 500 MG tablet TAKE 1 TABLET (500 MG TOTAL) BY MOUTH 2 (TWO) TIMES DAILY AS NEEDED FOR MUSCLE SPASMS. (Patient not taking: Reported on 10/26/2019) 60 tablet 0  . metoprolol tartrate (LOPRESSOR) 50 MG tablet Take 1 tablet (50 mg total) by mouth 2 (two) times daily. 180 tablet 3  . Multiple Vitamins-Minerals (BARIATRIC MULTIVITAMINS/IRON PO) Take 2 each by mouth daily.    . Multiple Vitamins-Minerals (ICAPS AREDS 2) CAPS Take 1 capsule by mouth 2 (two) times daily.    . nortriptyline (PAMELOR) 50 MG capsule Take 150 mg by mouth at bedtime.    . ondansetron (ZOFRAN) 8 MG tablet Take 8 mg by mouth every 8 (eight) hours as needed for nausea.   0  . pantoprazole (PROTONIX) 40 MG tablet Take 1 tablet (40 mg total) by mouth daily. 90 tablet 0  . Probiotic Product ([place]) CAPS Take 1 capsule by mouth at bedtime.     Marland Kitchen Propylene Glycol (SYSTANE COMPLETE) 0.6 % SOLN Place 1 drop into both eyes daily at 12 noon.     . topiramate (TOPAMAX) 25 MG tablet TAKE 2 TABLETS (50 MG TOTAL) BY MOUTH AT BEDTIME (Patient taking differently: Take 50 mg by mouth daily. ) 180 tablet 0  . XIIDRA 5 % SOLN Apply 1 drop to eye 2 (two) times daily.     No current facility-administered medications for this visit.    Medication Side Effects: None  Allergies:  Allergies  Allergen Reactions  . Erythromycin Nausea Only    Abdominal pain   . Tramadol Itching  . Dilaudid [Hydromorphone Hcl] Itching    Past Medical History:  Diagnosis Date  . Anemia    previously followed by Dr. Jamse Arn for anemia and elevated platelets  . Anxiety   . C. difficile colitis 10/01/12   treated by [facility]  . Chronic fatigue syndrome   . DDD (degenerative disc disease), lumbar 08/19/14   and facet arthroplasty & left lumbar radiculopathy (Dr.Ramos)  . Depression   . Dyssynergia    dyssynergenic defecation, contributing to fecal incontinence.  . Edema   . Fibromyalgia   . Gastroparesis    followed at [facility]  . GERD (gastroesophageal reflux disease)   .  History of kidney stones   . Hyperlipidemia   . Kidney stone   . Lumbar radiculopathy   . Migraine   . Neuropathy   . Obstructive sleep apnea    Does  wear  CPAP  . Paresthesia    Dr. Everette Rank at [facility]  . Pelvic floor dysfunction    pelvic floor dyssynergy  . Plantar fasciitis 02/2011   R foot  . Pneumonia    2012  . PONV (postoperative nausea and vomiting)    pt states has gastroparesis has difficulty taking antibiotics and narcotics has severe nausea and vomiting   . Restless leg syndrome   . S/P endometrial ablation 08/09/2006   Novasure Ablation  . S/P epidural steroid injection 09/20/14   Dr.Ramos  . Tremor    Dr. Everette Rank  . Urinary frequency   . Urinary incontinence     Family History  Problem Relation Age of Onset  . Allergies Mother   . Hypertension Mother   . Heart disease Mother        possible valve problem - leaking valve  . Macular degeneration Mother   . Heart disease Father        pacemaker, CHF  . Hypertension Father   . Diabetes Father        borderline  . Stroke Father 59  . Kidney disease Father   . Asthma Sister   . Irritable bowel syndrome Sister   . Allergies Sister   . Heart disease Paternal Grandmother   . Heart disease Paternal Grandfather   . Cancer Maternal Aunt        leukemia  . Cancer Maternal Aunt   . Colon cancer  Maternal Aunt        late 68's  . CAD Neg Hx     Social History   Socioeconomic History  . Marital status: Married    Spouse name: Not on file  . Number of children: 2  . Years of education: Not on file  . Highest education level: Not on file  Occupational History  . Occupation: Therapist, art (on disability)    Employer: [employer]  Tobacco Use  . Smoking status: Never Smoker  . Smokeless tobacco: Never Used  Substance and Sexual Activity  . Alcohol use: No    Alcohol/week: 0.0 standard drinks  . Drug use: No  . Sexual activity: Not Currently    Birth control/protection: Post-menopausal    Comment: 1st intercourse 63 yo-Fewer than 5 partners  Other Topics Concern  . Not on file  Social History Narrative   Married, 1 dog. 1 son in [place] (grandson born 06/2017), 1 stepson in [place], with 2 children   Social Determinants of Health   Financial Resource Strain:   . Difficulty of Paying Living Expenses: Not on file  Food Insecurity:   . Worried About Charity fundraiser in the Last Year: Not on file  . Ran Out of Food in the Last Year: Not on file  Transportation Needs:   . Lack of Transportation (Medical): Not on file  . Lack of Transportation (Non-Medical): Not on file  Physical Activity:   . Days of Exercise per Week: Not on file  . Minutes of Exercise per Session: Not on file  Stress:   . Feeling of Stress : Not on file  Social Connections:   . Frequency of Communication with Friends and Family: Not on file  . Frequency of Social Gatherings with Friends and Family: Not on  file  . Attends Religious Services: Not on file  . Active Member of Clubs or Organizations: Not on file  . Attends Archivist Meetings: Not on file  . Marital Status: Not on file  Intimate Partner Violence:   . Fear of Current or Ex-Partner: Not on file  . Emotionally Abused: Not on file  . Physically Abused: Not on file  . Sexually Abused: Not on file    Past Medical  History, Surgical history, Social history, and Family history were reviewed and updated as appropriate.   Please see review of systems for further details on the patient's review from today.   Objective:   Physical Exam:  LMP 07/27/2006   Physical Exam Constitutional:      General: She is not in acute distress.    Appearance: She is well-developed.  Musculoskeletal:        General: No deformity.  Neurological:     Mental Status: She is alert and oriented to person, place, and time.     Coordination: Coordination normal.  Psychiatric:        Attention and Perception: Attention and perception normal. She does not perceive auditory or visual hallucinations.        Mood and Affect: Mood normal. Mood is not anxious or depressed. Affect is not labile, blunt, angry or inappropriate.        Speech: Speech normal.        Behavior: Behavior normal.        Thought Content: Thought content normal. Thought content is not paranoid or delusional. Thought content does not include homicidal or suicidal ideation. Thought content does not include homicidal or suicidal plan.        Cognition and Memory: Cognition and memory normal.        Judgment: Judgment normal.     Comments: Insight intact     Lab Review:     Component Value Date/Time   NA 144 11/02/2019 0915   K 3.9 11/02/2019 0915   CL 103 11/02/2019 0915   CO2 24 11/02/2019 0915   GLUCOSE 82 11/02/2019 0915   GLUCOSE 96 09/29/2019 0645   BUN 19 11/02/2019 0915   CREATININE 0.79 11/02/2019 0915   CREATININE 0.74 10/03/2017 1020   CALCIUM 9.0 11/02/2019 0915   PROT 6.5 11/02/2019 0915   ALBUMIN 3.9 11/02/2019 0915   AST 31 11/02/2019 0915   ALT 31 11/02/2019 0915   ALKPHOS 121 (H) 11/02/2019 0915   BILITOT 0.2 11/02/2019 0915   GFRNONAA 80 11/02/2019 0915   GFRAA 93 11/02/2019 0915       Component Value Date/Time   WBC 9.6 11/02/2019 0915   WBC 10.2 10/01/2019 0328   RBC 4.47 11/02/2019 0915   RBC 3.76 (L) 10/01/2019 0328    HGB 13.2 11/02/2019 0915   HGB 11.7 05/02/2011 1412   HCT 40.7 11/02/2019 0915   HCT 37.1 05/02/2011 1412   PLT 244 11/02/2019 0915   MCV 91 11/02/2019 0915   MCV 94.4 05/02/2011 1412   MCH 29.5 11/02/2019 0915   MCH 29.8 10/01/2019 0328   MCHC 32.4 11/02/2019 0915   MCHC 30.6 10/01/2019 0328   RDW 15.0 11/02/2019 0915   RDW 18.3 (H) 05/02/2011 1412   LYMPHSABS 2.8 11/02/2019 0915   LYMPHSABS 2.4 05/02/2011 1412   MONOABS 1.2 (H) 10/01/2019 0328   MONOABS 0.8 05/02/2011 1412   EOSABS 0.4 11/02/2019 0915   BASOSABS 0.1 11/02/2019 0915   BASOSABS 0.1 05/02/2011 1412  No results found for: POCLITH, LITHIUM   No results found for: PHENYTOIN, PHENOBARB, VALPROATE, CBMZ   .res Assessment: Plan:    Plan:  1. Xanax 0.25mg  TID 2. Cymbalta 60mg  BID 3. Abilify 5mg  daily  Melatonin PRN  RTC 6 months  Patient advised to contact office with any questions, adverse effects, or acute worsening in signs and symptoms.  Discussed potential metabolic side effects associated with atypical antipsychotics, as well as potential risk for movement side effects. Advised pt to contact office if movement side effects occur.   Discussed potential metabolic side effects associated with atypical antipsychotics, as well as potential risk for movement side effects. Advised pt to contact office if movement side effects occur.   Diagnoses and all orders for this visit:  Major depressive disorder, recurrent episode, moderate (HCC)  Generalized anxiety disorder  Bipolar II disorder ([place])  Insomnia, unspecified type     Please see After Visit Summary for patient specific instructions.  Future Appointments  Date Time Provider [place]  11/24/2019 10:30 AM Ruby Cola, RD [place] [facility]  01/05/2020 10:20 AM Ofilia Neas, PA-C [facility] None  05/02/2020  9:45 AM Rita Ohara, MD [facility]    No orders of the defined types were placed in this  encounter.   -------------------------------

## 2019-11-15 ENCOUNTER — Other Ambulatory Visit: Payer: Self-pay | Admitting: Family Medicine

## 2019-11-15 DIAGNOSIS — E785 Hyperlipidemia, unspecified: Secondary | ICD-10-CM

## 2019-11-16 ENCOUNTER — Other Ambulatory Visit: Payer: Self-pay | Admitting: Rheumatology

## 2019-11-16 NOTE — Telephone Encounter (Signed)
Last Visit: 07/08/2019 Next Visit: 01/05/2020  Okay to refill per Dr. Estanislado Pandy.

## 2019-11-24 ENCOUNTER — Encounter: Payer: Medicare HMO | Attending: Surgery | Admitting: Skilled Nursing Facility1

## 2019-11-24 ENCOUNTER — Other Ambulatory Visit: Payer: Self-pay

## 2019-11-24 DIAGNOSIS — E669 Obesity, unspecified: Secondary | ICD-10-CM | POA: Insufficient documentation

## 2019-11-24 DIAGNOSIS — Z9884 Bariatric surgery status: Secondary | ICD-10-CM | POA: Insufficient documentation

## 2019-11-24 DIAGNOSIS — Z713 Dietary counseling and surveillance: Secondary | ICD-10-CM | POA: Diagnosis not present

## 2019-11-24 NOTE — Patient Instructions (Addendum)
-  Continue to aim for 3 bottles of water daily  -Eat non-starchy vegetables 2 times a day 7 days a week  -Start out with soft cooked vegetables today and tomorrow; if tolerated begin to eat raw vegetables or cooked including salads  -Eat your 3 ounces of protein first then start in on your non-starchy vegetables; once you understand how much of your meal leads to satisfaction and not full while still eating 3 ounces of protein and non-starchy vegetables you can eat them in any order   -Continue to aim for 30 minutes of activity at least 5 times a week  -Do NOT cook with/add to your food: alfredo sauce, cheese sauce, barbeque sauce, ketchup, fat back, butter, bacon grease, grease, Crisco   -Do not lay down within 2 hours of eating   -Set an alarm on your phone to remind you to drink   -Limit cheese to once a day  -Try flavored greek yogurt   -1/4 cup of nuts one time a day is good   -For one whole day do not eat meat, eat seafood as your protein avoid using butter or cocktail sauce   -Choose citrate for your calcium

## 2019-11-24 NOTE — Progress Notes (Signed)
Bariatric Nutrition Follow-Up Visit Medical Nutrition Therapy   2 Months Post-Operative RYGB Surgery Surgery Date: 10/13/2019 NUTRITION ASSESSMENT    Anthropometrics  Start weight at [facility]: 225.8 lbs (date: 05/06/2018) Today's weight: pt declined lbs Weight change:  lbs (since previous nutrition visit)   Clinical  Medical hx: migraines, OSA, GERD, kidney stones, fibromyalgia Medications: topamax, protonix, lopressor, gabapentin, hydrocodone, xanax, abilify Labs: see list   Lifestyle & Dietary Hx  Pt states she is having reflux still PA suggesting increasing her protonix. Reflux occurring weekly. Feeling like it is worse at night occassionally with regurgitation and no vomiting. Pt states she does not have nausea and couple times of bloat. Pt states she has been taking stool softener and miralax.  Pt states she has less discomfort with the alkaline water than with regular water.  Pt admits to forgetting to drink throughout the day.  Pt states she will be doing BELT.   Estimated daily fluid intake: 50 oz Estimated daily protein intake: 60+ g Supplements: bar advantage, calcium  Current average weekly physical activity: ADL's  24-Hr Dietary Recall daily protein shake First Meal: egg and cheese sometimes with canadian bacon Snack:  Second Meal: protein shake with meat  Snack: cheese stick  Third Meal: 3 ounces chicken or lean hamburger  Snack: cheese  Beverages: alkaline water 9+; 46-50 ounces; gatorade zero  Post-Op Goals/ Signs/ Symptoms Using straws: no Drinking while eating: no Chewing/swallowing difficulties: no Changes in vision: no Changes to mood/headaches: no Hair loss/changes to skin/nails: no Difficulty focusing/concentrating: no Sweating: no Dizziness/lightheadedness: no Palpitations: no  Carbonated/caffeinated beverages: no N/V/D/C/Gas: daily with miralax every other day; Reflux Abdominal pain: no Dumping syndrome: no    NUTRITION DIAGNOSIS   Overweight/obesity ([place]-3.3) related to past poor dietary habits and physical inactivity as evidenced by completed bariatric surgery and following dietary guidelines for continued weight loss and healthy nutrition status.     NUTRITION INTERVENTION Nutrition counseling (C-1) and education (E-2) to facilitate bariatric surgery goals, including: . Diet advancement to the next phase (phase 4 ) now including vegetables . The importance of consuming adequate calories as well as certain nutrients daily due to the body's need for essential vitamins, minerals, and fats . The importance of daily physical activity and to reach a goal of at least 150 minutes of moderate to vigorous physical activity weekly (or as directed by their physician) due to benefits such as increased musculature and improved lab values  Handouts Provided Include   Non starchy vegetables   Goals: -Continue to aim for 3 bottles of water daily -Eat non-starchy vegetables 2 times a day 7 days a week -Start out with soft cooked vegetables today and tomorrow; if tolerated begin to eat raw vegetables or cooked including salads -Eat your 3 ounces of protein first then start in on your non-starchy vegetables; once you understand how much of your meal leads to satisfaction and not full while still eating 3 ounces of protein and non-starchy vegetables you can eat them in any order  -Continue to aim for 30 minutes of activity at least 5 times a week -Do NOT cook with/add to your food: alfredo sauce, cheese sauce, barbeque sauce, ketchup, fat back, butter, bacon grease, grease, Crisco  -Do not lay down within 2 hours of eating  -Set an alarm on your phone to remind you to drink  -Limit cheese to once a day -Try flavored greek yogurt  -1/4 cup of nuts one time a day is good  -For one whole  day do not eat meat, eat seafood as your protein avoid using butter or cocktail sauce  -Choose citrate for your calcium   Learning Style & Readiness  for Change Teaching method utilized: Visual & Auditory  Demonstrated degree of understanding via: Teach Back  Barriers to learning/adherence to lifestyle change: none identified   RD's Notes for Next Visit . Assess adherence to pt chosen goals   MONITORING & EVALUATION Dietary intake, weekly physical activity, body weight: 2 months

## 2019-12-08 DIAGNOSIS — G4733 Obstructive sleep apnea (adult) (pediatric): Secondary | ICD-10-CM | POA: Diagnosis not present

## 2019-12-12 ENCOUNTER — Other Ambulatory Visit: Payer: Self-pay | Admitting: Rheumatology

## 2019-12-14 NOTE — Telephone Encounter (Signed)
ok 

## 2019-12-14 NOTE — Telephone Encounter (Signed)
Last Visit: 07/08/2019 Next Visit: 01/05/2020  Okay to refill Armodafinil?

## 2019-12-15 ENCOUNTER — Other Ambulatory Visit: Payer: Self-pay | Admitting: Rheumatology

## 2019-12-15 ENCOUNTER — Ambulatory Visit: Payer: Medicare Other | Attending: Internal Medicine

## 2019-12-15 DIAGNOSIS — Z20822 Contact with and (suspected) exposure to covid-19: Secondary | ICD-10-CM

## 2019-12-16 LAB — NOVEL CORONAVIRUS, NAA: SARS-CoV-2, NAA: NOT DETECTED

## 2019-12-17 ENCOUNTER — Telehealth: Payer: Self-pay | Admitting: Rheumatology

## 2019-12-17 NOTE — Telephone Encounter (Signed)
Submitted a Prior Authorization request to [employer] for Armodafinil via Cover My Meds. Will update once we receive a response.     

## 2019-12-17 NOTE — Telephone Encounter (Signed)
Patient calling because she needs her refill on Armodafinil, but insurance requiring a prior authorization. Patient uses [facility] on [address]. Please call patient to advise of status of PA.

## 2019-12-21 NOTE — Telephone Encounter (Signed)
Received notification from [facility] regarding a prior authorization for Armodafinil . Authorization has been APPROVED from 12/17/19  to 06/15/20.   Will send document to scan center.  Authorization #  [id number].   Called pharmacy to advise of approval and they are getting prescription ready for patient. Patient advised.

## 2019-12-21 NOTE — Telephone Encounter (Signed)
Per patient, she received a call from the insurance stating a prior authorization has be obtained for her medication. Per patient, pharmacy has not received authorization. Patient is completely out of medication. Please follow up for patient.

## 2019-12-23 ENCOUNTER — Other Ambulatory Visit: Payer: Self-pay | Admitting: Rheumatology

## 2019-12-23 NOTE — Telephone Encounter (Signed)
Last Visit:07/08/2019 Next Visit:01/05/2020  Okay to refillper Dr. Estanislado Pandy

## 2019-12-28 ENCOUNTER — Encounter: Payer: Medicare Other | Attending: Surgery | Admitting: Skilled Nursing Facility1

## 2019-12-28 ENCOUNTER — Other Ambulatory Visit: Payer: Self-pay

## 2019-12-28 DIAGNOSIS — Z713 Dietary counseling and surveillance: Secondary | ICD-10-CM | POA: Insufficient documentation

## 2019-12-28 DIAGNOSIS — E669 Obesity, unspecified: Secondary | ICD-10-CM | POA: Diagnosis not present

## 2019-12-28 NOTE — Progress Notes (Signed)
Office Visit Note  Patient: [name]             Date of Birth: 1956-05-06           MRN: [id number]             PCP: Rita Ohara, MD Referring: Rita Ohara, MD Visit Date: 01/05/2020 Occupation: @GUAROCC @  Subjective:  Trapezius muscle spasms    History of Present Illness: [name] is a 64 y.o. female with history of gout, fibromyalgia, and DDD.  She denies any recent gout flares.  She has been taking allopurinol 300 mg 1 tablet by mouth daily colchicine 0.6 mg 1 tablet by mouth as needed for gout flares. She continues to have generalized muscle aches muscle tenderness due to fibromyalgia.  Today she presents with trapezius muscle spasms bilaterally.  She requested trigger point injections today.  She denies any lower back pain at this time.  She states both knee replacements are doing well.  She has occasional bilateral hip joint pain.  She denies any joint swelling at this time.  She states that her level of fatigue has been better since having bariatric surgery on 09/29/2019.  She will be starting her exercise program soon.   Activities of Daily Living:  Patient reports morning stiffness for 0 minutes.   Patient Reports nocturnal pain.  Difficulty dressing/grooming: Denies Difficulty climbing stairs: Reports Difficulty getting out of chair: Denies Difficulty using hands for taps, buttons, cutlery, and/or writing: Denies  Review of Systems  Constitutional: Positive for fatigue.  HENT: Positive for mouth dryness and nose dryness. Negative for mouth sores.   Eyes: Positive for dryness.  Respiratory: Negative for shortness of breath, wheezing and difficulty breathing.   Cardiovascular: Negative for chest pain and palpitations.  Gastrointestinal: Positive for constipation. Negative for blood in stool and diarrhea.  Endocrine: Negative for increased urination.  Genitourinary: Negative for difficulty urinating and painful urination.  Musculoskeletal: Positive for arthralgias  and joint pain. Negative for joint swelling, muscle weakness, morning stiffness and muscle tenderness.  Skin: Negative for rash.  Allergic/Immunologic: Negative for susceptible to infections.  Neurological: Positive for headaches. Negative for dizziness, numbness, memory loss and weakness.  Hematological: Negative for bruising/bleeding tendency.  Psychiatric/Behavioral: Positive for sleep disturbance. Negative for confusion.    PMFS History:  Patient Active Problem List   Diagnosis Date Noted  . Gastric bypass status for obesity 09/29/2019  . Dyspnea 12/26/2017  . Restrictive lung disease secondary to obesity 12/26/2017  . Atypical chest pain 12/06/2017  . Sinus tachycardia 12/06/2017  . Chest pain 12/06/2017  . DJD (degenerative joint disease), cervical 12/24/2016  . Primary osteoarthritis of both hips 12/24/2016  . Primary osteoarthritis of both knees 12/24/2016  . H/O total knee replacement, right 12/24/2016  . Spondylosis of lumbar region without myelopathy or radiculopathy 12/24/2016  . Acute gout 05/30/2016  . Myalgia 03/29/2016  . Other long term (current) drug therapy 03/29/2016  . Idiopathic peripheral neuropathy 03/29/2016  . Cannot sleep 03/29/2016  . Migraine without aura and responsive to treatment 03/29/2016  . Multifocal myoclonus 03/29/2016  . Restless leg 03/29/2016  . Has a tremor 03/29/2016  . History of aspiration pneumonitis 01/25/2016  . History of acute bronchitis 01/25/2016  . LPRD (laryngopharyngeal reflux disease) 01/25/2016  . Imbalance 01/09/2016  . Serotonin syndrome 12/22/2015  . OA (osteoarthritis) of knee 09/05/2015  . Obesity 05/17/2015  . OSA (obstructive sleep apnea) 03/16/2013  . Iron deficiency 11/06/2012  . Thrombocythemia ([place]) 11/06/2012  .  Leukocytosis 11/05/2012  . Impaired fasting glucose 07/23/2012  . Bronchitis 04/03/2012  . Kidney stone on left side 03/05/2012  . Kidney cysts 03/04/2012  . Loose stools 03/04/2012  . Urinary  frequency 12/04/2011  . Restless leg syndrome 12/04/2011  . Polypharmacy 12/04/2011  . Bladder incontinence 12/04/2011  . Fibromyalgia   . Hyperlipidemia   . Edema   . S/P endometrial ablation   . Allergic rhinitis 09/18/2011  . Depression, major, single episode, in partial remission ([place]) 04/21/2011  . PRECORDIAL PAIN 02/17/2010  . Anxiety state 01/30/2010  . Migraine 01/30/2010  . GERD 01/30/2010  . Gastroparesis 01/30/2010    Past Medical History:  Diagnosis Date  . Anemia    previously followed by Dr. Jamse Arn for anemia and elevated platelets  . Anxiety   . C. difficile colitis 10/01/12   treated by [facility]  . Chronic fatigue syndrome   . DDD (degenerative disc disease), lumbar 08/19/14   and facet arthroplasty & left lumbar radiculopathy (Dr.Ramos)  . Depression   . Dyssynergia    dyssynergenic defecation, contributing to fecal incontinence.  . Edema   . Fibromyalgia   . Gastroparesis    followed at [facility]  . GERD (gastroesophageal reflux disease)   . History of kidney stones   . Hyperlipidemia   . Kidney stone   . Lumbar radiculopathy   . Migraine   . Neuropathy   . Obstructive sleep apnea    Does  wear  CPAP  . Paresthesia    Dr. Everette Rank at [facility]  . Pelvic floor dysfunction    pelvic floor dyssynergy  . Plantar fasciitis 02/2011   R foot  . Pneumonia    2012  . PONV (postoperative nausea and vomiting)    pt states has gastroparesis has difficulty taking antibiotics and narcotics has severe nausea and vomiting   . Restless leg syndrome   . S/P endometrial ablation 08/09/2006   Novasure Ablation  . S/P epidural steroid injection 09/20/14   Dr.Ramos  . Tremor    Dr. Everette Rank  . Urinary frequency   . Urinary incontinence     Family History  Problem Relation Age of Onset  . Allergies Mother   . Hypertension Mother   . Heart disease Mother        possible valve problem - leaking valve  . Macular degeneration Mother   . Heart disease Father          pacemaker, CHF  . Hypertension Father   . Diabetes Father        borderline  . Stroke Father 33  . Kidney disease Father   . Asthma Sister   . Irritable bowel syndrome Sister   . Allergies Sister   . Heart disease Paternal Grandmother   . Heart disease Paternal Grandfather   . Cancer Maternal Aunt        leukemia  . Cancer Maternal Aunt   . Colon cancer Maternal Aunt        late 53's  . CAD Neg Hx    Past Surgical History:  Procedure Laterality Date  . CHOLECYSTECTOMY  9/05  . ENDOMETRIAL ABLATION  08/09/2006   Dr. Valentina Shaggy Ablation  . FACET JOINT INJECTION  04/17/2017   Left L4-5 and L5-S1  . GASTRIC ROUX-EN-Y N/A 09/29/2019   Procedure: LAPAROSCOPIC ROUX-EN-Y GASTRIC BYPASS WITH UPPER ENDOSCOPY, ERAS Pathway;  Surgeon: Johnathan Hausen, MD;  Location: [facility];  Service: General;  Laterality: N/A;  . KNEE ARTHROPLASTY    .  KNEE SURGERY  1999   R knee, Dr. Eddie Dibbles, torn cartilage  . RETINAL LASER PROCEDURE Right 08/28/2018   laser retinopexy  . RIGHT/LEFT HEART CATH AND CORONARY ANGIOGRAPHY N/A 01/01/2018   Procedure: RIGHT/LEFT HEART CATH AND CORONARY ANGIOGRAPHY;  Surgeon: Sherren Mocha, MD;  Location: [place] [facility];  Service: Cardiovascular;  Laterality: N/A;  . TONSILLECTOMY  1968  . TONSILLECTOMY    . TOTAL KNEE ARTHROPLASTY Right 09/05/2015   Procedure: RIGHT TOTAL KNEE ARTHROPLASTY;  Surgeon: Gaynelle Arabian, MD;  Location: [facility];  Service: Orthopedics;  Laterality: Right;  . TOTAL KNEE ARTHROPLASTY Left 07/14/2018   Procedure: LEFT TOTAL KNEE ARTHROPLASTY;  Surgeon: Gaynelle Arabian, MD;  Location: [facility];  Service: Orthopedics;  Laterality: Left;  . ULTRASOUND GUIDANCE FOR VASCULAR ACCESS  01/01/2018   Procedure: Ultrasound Guidance For Vascular Access;  Surgeon: Sherren Mocha, MD;  Location: [place] [facility];  Service: Cardiovascular;;  . UMBILICAL HERNIA REPAIR N/A 09/29/2019   Procedure: HERNIA REPAIR UMBILICAL ADULT;  Surgeon: Johnathan Hausen, MD;   Location: [facility];  Service: General;  Laterality: N/A;   Social History   Social History Narrative   Married, 1 dog. 1 son in [place] (grandson born 06/2017), 1 stepson in [place], with 2 children   Immunization History  Administered Date(s) Administered  . Influenza Inj Mdck Quad Pf 09/13/2019  . Influenza Split 09/17/2011, 09/03/2012, 09/04/2013  . Influenza,inj,Quad PF,6+ Mos 08/16/2014, 07/27/2015, 07/31/2017, 08/13/2018  . Influenza-Unspecified 10/04/2016, 07/31/2017, 09/23/2017  . Tdap 01/16/2011, 06/20/2017  . Zoster Recombinat (Shingrix) 02/05/2019, 07/14/2019     Objective: Vital Signs: BP 105/66 (BP Location: Left Arm, Patient Position: Sitting, Cuff Size: Normal)   Pulse 76   Resp 18   Ht 5' 2.5" (1.588 m)   Wt 188 lb (85.3 kg)   LMP 07/27/2006   BMI 33.84 kg/m    Physical Exam Vitals and nursing note reviewed.  Constitutional:      Appearance: She is well-developed.  HENT:     Head: Normocephalic and atraumatic.  Eyes:     Conjunctiva/sclera: Conjunctivae normal.  Cardiovascular:     Rate and Rhythm: Normal rate and regular rhythm.     Heart sounds: Normal heart sounds.  Pulmonary:     Effort: Pulmonary effort is normal.     Breath sounds: Normal breath sounds.  Abdominal:     General: Bowel sounds are normal.     Palpations: Abdomen is soft.  Musculoskeletal:     Cervical back: Normal range of motion.  Lymphadenopathy:     Cervical: No cervical adenopathy.  Skin:    General: Skin is warm and dry.     Capillary Refill: Capillary refill takes less than 2 seconds.  Neurological:     Mental Status: She is alert and oriented to person, place, and time.  Psychiatric:        Behavior: Behavior normal.      Musculoskeletal Exam: C-spine, thoracic spine, lumbar spine good range of motion.  No midline spinal tenderness.  No SI joint tenderness.  Trapezius muscle tension and tenderness bilaterally.  Shoulder joints, elbow joints, wrist joints, MCPs, PIPs and  DIPs good range of motion no synovitis.  She has mild DIP joint synovial thickening consistent with osteoarthritis of both hands.  Hip joints have good range of motion with no discomfort.  Bilateral knee replacements have good range of motion with no warmth or effusion.  Ankle joints have good range of motion with no tenderness or inflammation.  She has  no tenderness over trochanteric bursa bilaterally.  CDAI Exam: CDAI Score: -- Patient Global: --; Provider Global: -- Swollen: --; Tender: -- Joint Exam 01/05/2020   No joint exam has been documented for this visit   There is currently no information documented on the homunculus. Go to the Rheumatology activity and complete the homunculus joint exam.  Investigation: No additional findings.  Imaging: No results found.  Recent Labs: Lab Results  Component Value Date   WBC 9.6 11/02/2019   HGB 13.2 11/02/2019   PLT 244 11/02/2019   NA 144 11/02/2019   K 3.9 11/02/2019   CL 103 11/02/2019   CO2 24 11/02/2019   GLUCOSE 82 11/02/2019   BUN 19 11/02/2019   CREATININE 0.79 11/02/2019   BILITOT 0.2 11/02/2019   ALKPHOS 121 (H) 11/02/2019   AST 31 11/02/2019   ALT 31 11/02/2019   PROT 6.5 11/02/2019   ALBUMIN 3.9 11/02/2019   CALCIUM 9.0 11/02/2019   GFRAA 93 11/02/2019    Speciality Comments: No specialty comments available.  Procedures:  Trigger Point Inj  Date/Time: 01/05/2020 10:38 AM Performed by: Ofilia Neas, PA-C Authorized by: Ofilia Neas, PA-C   Consent Given by:  Patient Site marked: the procedure site was marked   Timeout: prior to procedure the correct patient, procedure, and site was verified   Indications:  Pain Total # of Trigger Points:  2 Location: neck   Needle Size:  27 G Approach:  Dorsal Medications #1:  0.5 mL lidocaine 1 %; 10 mg triamcinolone acetonide 40 MG/ML Medications #2:  0.5 mL lidocaine 1 %; 10 mg triamcinolone acetonide 40 MG/ML Patient tolerance:  Patient tolerated the procedure  well with no immediate complications   Allergies: Erythromycin, Tramadol, and Dilaudid [hydromorphone hcl]   Assessment / Plan:     Visit Diagnoses: Idiopathic chronic gout of multiple sites without tophus -She has not had any recent gout flares.  She is clinically doing well on allopurinol 300 mg 1 tablet by mouth daily and colchicine 0.6 mg 1 tablet by mouth as needed for gout flares.  Her uric acid level was 5.8 on 11/02/2019.  We will recheck uric acid level in 6 months.  She will continue on the current treatment regimen.  She does not need any refills at this time.  She was advised to notify us if develop signs or symptoms of a gout flare.  She will follow-up in the office in 6 months.  Fibromyalgia: She has persistent generalized muscle aches muscle tenderness due to fibromyalgia.  She presents today with trapezius muscle spasms bilaterally.  She requested trigger point injections today.  She tolerated the procedure well.  She uses Biofreeze topically as needed for pain relief.  We discussed the importance of regular exercise.  She will be starting a 16-week exercise program on [facility].  She reports that overall her level fatigue has improved since having bariatric surgery on 09/29/2019.  She continues to have difficulty falling asleep and interrupted sleep at night.  We discussed the importance of good sleep hygiene.  She takes melatonin 10 mg a mouth at bedtime and nortriptyline as prescribed.  Trapezius muscle spasm: She presents today with trapezius muscle tension and muscle tenderness bilaterally.  She has been having muscle spasms intermittently.  She requested trigger point injections today.  She tolerated the procedure well.  The procedure note was completed above.  She uses Biofreeze topically as needed for pain relief.  History of total knee replacement, bilateral: Performed by  Dr. Wynelle Link. Doing well.  She has good range of motion bilaterally.  No warmth or effusion was noted on  exam.  Primary osteoarthritis of both hips: She is good range of motion of bilateral hip joints with no discomfort at this time.  She is occasional discomfort radiating to her groin but has no difficulty with ambulation or nocturnal pain at this time.  DDD (degenerative disc disease), cervical: She has good range of motion of the C-spine on exam.  No symptoms of radiculopathy.  She presents today with trapezius muscle tension and muscle tenderness bilaterally.  She requested trigger point injections today.  Procedure note completed above.  DDD (degenerative disc disease), lumbar: She has good range of motion with no discomfort.  No midline spinal tenderness.  She has no symptoms of radiculopathy at this time.  Other medical conditions are listed as follows:  History of sleep apnea  History of migraine  History of depression  Thrombocythemia ([place])  Other elevated white blood cell (WBC) count  History of anemia  Orders: Orders Placed This Encounter  Procedures  . Trigger Point Inj   No orders of the defined types were placed in this encounter.   Face-to-face time spent with patient was 30 minutes. Greater than 50% of time was spent in counseling and coordination of care.  Follow-Up Instructions: Return in about 6 months (around 07/04/2020) for Gout, Fibromyalgia, DDD.   Ofilia Neas, PA-C   I examined and evaluated the patient with Hazel Sams PA.  Gout has been well controlled.  She continues to have some discomfort from fibromyalgia.  She had bilateral trapezius spasm on my examination today.  Trigger point injections were given today.  The plan of care was discussed as noted above.  Bo Merino, MD  Note - This record has been created using Editor, commissioning.  Chart creation errors have been sought, but may not always  have been located. Such creation errors do not reflect on  the standard of medical care.

## 2019-12-28 NOTE — Progress Notes (Signed)
Bariatric Nutrition Follow-Up Visit Medical Nutrition Therapy    Post-Operative RYGB Surgery Surgery Date: 10/13/2019 NUTRITION ASSESSMENT    Anthropometrics  Start weight at [facility]: 225.8 lbs (date: 05/06/2018) Today's weight: pt declined lbs Weight change:  lbs (since previous nutrition visit)  Body Composition Scale 12/28/2019  Total Body Fat % 41.3  Visceral Fat 13  Fat-Free Mass % 58.6   Total Body Water % 43.8   Muscle-Mass lbs 27.5  Body Fat Displacement          Torso  lbs 48         Left Leg  lbs 9.6         Right Leg  lbs 9.6         Left Arm  lbs 4.8         Right Arm   lbs 4.8      Clinical  Medical hx: migraines, OSA, GERD, kidney stones, fibromyalgia Medications: topamax, protonix, lopressor, gabapentin, hydrocodone, xanax, abilify Labs: see list   Lifestyle & Dietary Hx  Pt states this past month she had had fewer occurences reflux taking acid reducer daily. Pt states she has not had any stomach pains. Pt states if she eats to fast her chest hurts. Pt states she starts BELT soon. Pt states she has been doing much better with drinking throughout the day trying to be more conscious setting her alarm for 30 minutes after. Pt states she drinks alkaline water. Pt states she does not like cooked vegetables but she likes them raw.   Estimated daily fluid intake: 50 oz Estimated daily protein intake: 60+ g Supplements: bar advantage, calcium  Current average weekly physical activity: ADL's  24-Hr Dietary Recall daily protein shake First Meal: egg and cheese sometimes with canadian bacon or premier protein  Snack: yogurt Second Meal: salad or beans  Snack: cheese stick or carrots and cauliflouer  Third Meal: 3 ounces chicken or lean hamburger or steak with salad and vegetables  Snack: cheese  Beverages: alkaline water 9+; 46-50 ounces; with more occurences of 64+ than before; gatorade zero  Post-Op Goals/ Signs/ Symptoms Using straws: no Drinking while  eating: no Chewing/swallowing difficulties: no Changes in vision: no Changes to mood/headaches: no Hair loss/changes to skin/nails: no Difficulty focusing/concentrating: no Sweating: no Dizziness/lightheadedness: no Palpitations: no  Carbonated/caffeinated beverages: no N/V/D/C/Gas: daily with miralax every other day; Reflux Abdominal pain: no Dumping syndrome: no    NUTRITION DIAGNOSIS  Overweight/obesity ([place]-3.3) related to past poor dietary habits and physical inactivity as evidenced by completed bariatric surgery and following dietary guidelines for continued weight loss and healthy nutrition status.     NUTRITION INTERVENTION Nutrition counseling (C-1) and education (E-2) to facilitate bariatric surgery goals, including: . Diet advancement to the next phase now including starchy vegetables . The importance of consuming adequate calories as well as certain nutrients daily due to the body's need for essential vitamins, minerals, and fats . The importance of daily physical activity and to reach a goal of at least 150 minutes of moderate to vigorous physical activity weekly (or as directed by their physician) due to benefits such as increased musculature and improved lab values  Handouts Provided Include   starchy vegetables   Goals:  -Keep aiming for 64+ ounces of fluid -Add in non starchy vegetables   Learning Style & Readiness for Change Teaching method utilized: Visual & Auditory  Demonstrated degree of understanding via: Teach Back  Barriers to learning/adherence to lifestyle change: none identified   RD's  Notes for Next Visit . Assess adherence to pt chosen goals   MONITORING & EVALUATION Dietary intake, weekly physical activity, body weight:

## 2019-12-30 DIAGNOSIS — H15002 Unspecified scleritis, left eye: Secondary | ICD-10-CM | POA: Diagnosis not present

## 2020-01-01 DIAGNOSIS — H04123 Dry eye syndrome of bilateral lacrimal glands: Secondary | ICD-10-CM | POA: Diagnosis not present

## 2020-01-01 DIAGNOSIS — H2513 Age-related nuclear cataract, bilateral: Secondary | ICD-10-CM | POA: Diagnosis not present

## 2020-01-01 DIAGNOSIS — Z79899 Other long term (current) drug therapy: Secondary | ICD-10-CM | POA: Diagnosis not present

## 2020-01-01 DIAGNOSIS — H15002 Unspecified scleritis, left eye: Secondary | ICD-10-CM | POA: Diagnosis not present

## 2020-01-05 ENCOUNTER — Other Ambulatory Visit: Payer: Self-pay

## 2020-01-05 ENCOUNTER — Encounter: Payer: Self-pay | Admitting: Rheumatology

## 2020-01-05 ENCOUNTER — Ambulatory Visit: Payer: Medicare Other | Admitting: Rheumatology

## 2020-01-05 VITALS — BP 105/66 | HR 76 | Resp 18 | Ht 62.5 in | Wt 188.0 lb

## 2020-01-05 DIAGNOSIS — M1A09X0 Idiopathic chronic gout, multiple sites, without tophus (tophi): Secondary | ICD-10-CM

## 2020-01-05 DIAGNOSIS — Z96653 Presence of artificial knee joint, bilateral: Secondary | ICD-10-CM

## 2020-01-05 DIAGNOSIS — M160 Bilateral primary osteoarthritis of hip: Secondary | ICD-10-CM

## 2020-01-05 DIAGNOSIS — M5136 Other intervertebral disc degeneration, lumbar region: Secondary | ICD-10-CM

## 2020-01-05 DIAGNOSIS — D75839 Thrombocytosis, unspecified: Secondary | ICD-10-CM

## 2020-01-05 DIAGNOSIS — M5030 Other cervical disc degeneration, unspecified cervical region: Secondary | ICD-10-CM

## 2020-01-05 DIAGNOSIS — M62838 Other muscle spasm: Secondary | ICD-10-CM | POA: Diagnosis not present

## 2020-01-05 DIAGNOSIS — Z8669 Personal history of other diseases of the nervous system and sense organs: Secondary | ICD-10-CM

## 2020-01-05 DIAGNOSIS — Z8659 Personal history of other mental and behavioral disorders: Secondary | ICD-10-CM

## 2020-01-05 DIAGNOSIS — M797 Fibromyalgia: Secondary | ICD-10-CM | POA: Diagnosis not present

## 2020-01-05 DIAGNOSIS — Z862 Personal history of diseases of the blood and blood-forming organs and certain disorders involving the immune mechanism: Secondary | ICD-10-CM

## 2020-01-05 DIAGNOSIS — D473 Essential (hemorrhagic) thrombocythemia: Secondary | ICD-10-CM

## 2020-01-05 DIAGNOSIS — D72828 Other elevated white blood cell count: Secondary | ICD-10-CM

## 2020-01-05 MED ORDER — TRIAMCINOLONE ACETONIDE 40 MG/ML IJ SUSP
10.0000 mg | INTRAMUSCULAR | Status: AC | PRN
  Administered 2020-01-05: 10 mg via INTRAMUSCULAR

## 2020-01-05 MED ORDER — LIDOCAINE HCL 1 % IJ SOLN
0.5000 mL | INTRAMUSCULAR | Status: AC | PRN
  Administered 2020-01-05: .5 mL

## 2020-01-18 ENCOUNTER — Encounter: Payer: Self-pay | Admitting: Family Medicine

## 2020-01-18 ENCOUNTER — Other Ambulatory Visit: Payer: Self-pay

## 2020-01-18 ENCOUNTER — Ambulatory Visit (INDEPENDENT_AMBULATORY_CARE_PROVIDER_SITE_OTHER): Payer: Medicare Other | Admitting: Family Medicine

## 2020-01-18 VITALS — BP 114/74 | HR 72 | Temp 97.5°F | Ht 62.5 in | Wt 182.6 lb

## 2020-01-18 DIAGNOSIS — S20212A Contusion of left front wall of thorax, initial encounter: Secondary | ICD-10-CM | POA: Diagnosis not present

## 2020-01-18 NOTE — Patient Instructions (Signed)
Use heat and/or ice (whichever feels best) to the painful area on the ribs. You may use tylenol as needed. You can also try topical medications if needed (such as SalonPas with lidocaine, or voltaren gel). If you have worsening pain, any shortness of breath, swelling or other issues, please return for re-evaluation.

## 2020-01-18 NOTE — Progress Notes (Signed)
Chief Complaint  Patient presents with  . Acute Visit    left sided rib pain. Reached across tub when she was bathing her grandson and her ribs started hurting. Hurts when she goes to get up or tries to take a deep breath.   3 nights ago, she reached across the tub (while bathing her grandson), left ribs leaned against the tub hard (while reaching far across), felt pain which has persisted ever since.  It is the worst in the morning, better as the day goes on.  She has been getting her exercise at the gym (walking and weights)--only one exercise with the weight hurt some this morning.  No bruising or swelling. Feels a hard area at the left lower ribs at times, where the pain is.  Didn't hear or feel a pop.  Hurts to take deep breath, blowing nose, cough.  PMH, PSH, [place] reviewed  She is s/p Roux-en-Y in 09/2019.  protonix was cut back to once daily, no other med changes recently. Started a bariatric MVI in 09/2019, no longer taking separate vitamins. Doesn't see them again until May. Denies any nausea/vomiting, gastroparesis is much better.  Outpatient Encounter Medications as of 01/18/2020  Medication Sig Note  . acetaminophen (TYLENOL) 500 MG tablet Take 1,000 mg by mouth 3 (three) times daily as needed for moderate pain.   Marland Kitchen allopurinol (ZYLOPRIM) 300 MG tablet TAKE 1 TABLET DAILY BY MOUTH. (Patient taking differently: Take 300 mg by mouth daily. )   . ALPRAZolam (XANAX) 0.25 MG tablet Take 1 tablet (0.25 mg total) by mouth 3 (three) times daily as needed for anxiety.   . ARIPiprazole (ABILIFY) 5 MG tablet Take 1 tablet (5 mg total) by mouth daily.   . Armodafinil 250 MG tablet TAKE 1 TABLET BY MOUTH EVERY DAY WITH BREAKFAST   . atorvastatin (LIPITOR) 40 MG tablet TAKE 1 TABLET BY MOUTH EVERY DAY   . Calcium-Vitamin D-Vitamin K (CALCIUM SOFT CHEWS PO) Take 1,500 mg by mouth daily. 01/18/2020: 3 chews daily to equate to 1500mg   . cetirizine (ZYRTEC) 10 MG tablet Take 10 mg by mouth at  bedtime.   . DULoxetine (CYMBALTA) 60 MG capsule Take 1 capsule (60 mg total) by mouth 2 (two) times daily.   . fluticasone (FLONASE) 50 MCG/ACT nasal spray PLACE 2 SPRAYS INTO BOTH NOSTRILS DAILY AS NEEDED FOR ALLERGIES (Patient taking differently: Place 2 sprays into both nostrils at bedtime. )   . gabapentin (NEURONTIN) 300 MG capsule Take 1 capsule (300 mg total) by mouth 3 (three) times daily. Gabapentin 300 mg Protocol Take a 300 mg capsule three times a day for two weeks following surgery. Then take a 300 mg capsule two times a day for two weeks.  Then resume a 300 mg capsule once a day at bedtime. (Patient taking differently: Take 300 mg by mouth 3 (three) times daily. ) 10/26/2019: Taking TID  . Melatonin 10 MG TABS Take 10 mg by mouth at bedtime.   . methocarbamol (ROBAXIN) 500 MG tablet TAKE 1 TABLET (500 MG TOTAL) BY MOUTH 2 (TWO) TIMES DAILY AS NEEDED FOR MUSCLE SPASMS.   . metoprolol tartrate (LOPRESSOR) 50 MG tablet Take 1 tablet (50 mg total) by mouth 2 (two) times daily.   . Multiple Vitamins-Minerals (BARIATRIC MULTIVITAMINS/IRON PO) Take 2 each by mouth daily.   . Multiple Vitamins-Minerals (ICAPS AREDS 2) CAPS Take 1 capsule by mouth 2 (two) times daily.   . nortriptyline (PAMELOR) 50 MG capsule Take 150 mg by mouth  at bedtime.   . pantoprazole (PROTONIX) 40 MG tablet Take 1 tablet (40 mg total) by mouth daily.   . Probiotic Product (Maben) CAPS Take 1 capsule by mouth at bedtime.    Marland Kitchen Propylene Glycol (SYSTANE COMPLETE) 0.6 % SOLN Place 1 drop into both eyes daily at 12 noon.    . topiramate (TOPAMAX) 25 MG tablet TAKE 2 TABLETS (50 MG TOTAL) BY MOUTH AT BEDTIME   . XIIDRA 5 % SOLN Apply 1 drop to eye 2 (two) times daily.   . [DISCONTINUED] CALCIUM PO Take 500 mg by mouth 3 (three) times daily.   Marland Kitchen albuterol (PROVENTIL HFA;VENTOLIN HFA) 108 (90 Base) MCG/ACT inhaler Inhale 1-2 puffs into the lungs every 6 (six) hours as needed for wheezing or shortness of  breath. (Patient not taking: Reported on 01/18/2020)   . colchicine 0.6 MG tablet TAKE 1 TABLET BY MOUTH DAILY AS NEEDED DURING GOUT FLARES. (Patient not taking: Reported on 01/18/2020)   . HYDROcodone-acetaminophen (NORCO/VICODIN) 5-325 MG tablet Take 1 tablet by mouth every 6 (six) hours as needed for moderate pain. (Patient not taking: Reported on 01/05/2020) 10/26/2019: Using prn for recent dentalwork  . meclizine (ANTIVERT) 25 MG tablet Take 1 tablet (25 mg total) by mouth 3 (three) times daily as needed for dizziness. (Patient not taking: Reported on 01/18/2020)   . ondansetron (ZOFRAN) 8 MG tablet Take 8 mg by mouth every 8 (eight) hours as needed for nausea.    . [DISCONTINUED] carbamazepine (TEGRETOL XR) 100 MG 12 hr tablet Take 200 mg by mouth daily.     . [DISCONTINUED] Eszopiclone 3 MG TABS Take 3 mg by mouth at bedtime. Take immediately before bedtime    No facility-administered encounter medications on file as of 01/18/2020.   Allergies  Allergen Reactions  . Erythromycin Nausea Only    Abdominal pain  . Tramadol Itching  . Dilaudid [Hydromorphone Hcl] Itching   ROS:  No fever, chills.  Denies URI symptoms. No headache, dizziness, chest pain. Some sneezing around flowers at a funeral yesterday. Left-sided rib pain per HPI. Nausea/vomiting significantly improved, as is reflux.  No other concerns. 226# pre-op wt, has had ongoing weight loss since her bariatric surgery. Feels good.  Knee pain has improved since weight loss.   PHYSICAL EXAM:  BP 114/74   Pulse 72   Temp (!) 97.5 F (36.4 C) (Other (Comment))   Ht 5' 2.5" (1.588 m)   Wt 182 lb 9.6 oz (82.8 kg)   LMP 07/27/2006   BMI 32.87 kg/m   Wt Readings from Last 3 Encounters:  01/18/20 182 lb 9.6 oz (82.8 kg)  01/05/20 188 lb (85.3 kg)  12/28/19 188 lb 1.6 oz (85.3 kg)   Pleasant, well-appearing female, in good spirits HEENT: conjunctiva and sclera are clear, EOMI. Wearing mask Heart: regular rate and rhythm Lungs:  clear bilaterally Chest wall: nontender at sternum.  She is tender at left lower ribs, along the floating ribs, at two levels (not at the lowest ribs, and not tender at CC junction with sternum). Extremities: no edema Abdomen: soft, nontender, WHSS Psych: normal mood, affect, hygiene and grooming  Contusion of rib on left side, initial encounter  Supportive measures--heat, topical meds. Listen to body, avoid activities which cause increased pain, fine to continue cardio and exercises that do not cause discomfort.  Tylenol prn.  Avoid NSAIDs due to roux-en-Y    Use heat and/or ice (whichever feels best) to the painful area on the ribs. You  may use tylenol as needed. You can also try topical medications if needed (such as SalonPas with lidocaine, or voltaren gel). If you have worsening pain, any shortness of breath, swelling or other issues, please return for re-evaluation.

## 2020-01-24 DIAGNOSIS — R519 Headache, unspecified: Secondary | ICD-10-CM | POA: Diagnosis not present

## 2020-01-24 DIAGNOSIS — Z20822 Contact with and (suspected) exposure to covid-19: Secondary | ICD-10-CM | POA: Diagnosis not present

## 2020-01-24 DIAGNOSIS — R21 Rash and other nonspecific skin eruption: Secondary | ICD-10-CM | POA: Diagnosis not present

## 2020-02-01 DIAGNOSIS — M797 Fibromyalgia: Secondary | ICD-10-CM | POA: Diagnosis not present

## 2020-02-02 DIAGNOSIS — H2513 Age-related nuclear cataract, bilateral: Secondary | ICD-10-CM | POA: Diagnosis not present

## 2020-02-02 DIAGNOSIS — H04123 Dry eye syndrome of bilateral lacrimal glands: Secondary | ICD-10-CM | POA: Diagnosis not present

## 2020-02-02 DIAGNOSIS — H353131 Nonexudative age-related macular degeneration, bilateral, early dry stage: Secondary | ICD-10-CM | POA: Diagnosis not present

## 2020-02-02 DIAGNOSIS — Z79899 Other long term (current) drug therapy: Secondary | ICD-10-CM | POA: Diagnosis not present

## 2020-02-09 ENCOUNTER — Other Ambulatory Visit: Payer: Self-pay | Admitting: Rheumatology

## 2020-02-09 NOTE — Telephone Encounter (Signed)
Last Visit: 01/05/20 Next Visit: 07/04/20  Okay to refill per Dr. Estanislado Pandy

## 2020-02-11 ENCOUNTER — Other Ambulatory Visit: Payer: Self-pay | Admitting: Rheumatology

## 2020-02-12 NOTE — Telephone Encounter (Signed)
Last Visit: 01/05/20 Next Visit: 07/04/20  Okay to refill per Dr. Estanislado Pandy

## 2020-02-17 DIAGNOSIS — S32000A Wedge compression fracture of unspecified lumbar vertebra, initial encounter for closed fracture: Secondary | ICD-10-CM | POA: Diagnosis not present

## 2020-02-17 DIAGNOSIS — M546 Pain in thoracic spine: Secondary | ICD-10-CM | POA: Diagnosis not present

## 2020-02-17 DIAGNOSIS — M545 Low back pain: Secondary | ICD-10-CM | POA: Diagnosis not present

## 2020-02-17 DIAGNOSIS — M4854XA Collapsed vertebra, not elsewhere classified, thoracic region, initial encounter for fracture: Secondary | ICD-10-CM | POA: Diagnosis not present

## 2020-03-09 DIAGNOSIS — M545 Low back pain: Secondary | ICD-10-CM | POA: Diagnosis not present

## 2020-03-09 DIAGNOSIS — M4854XD Collapsed vertebra, not elsewhere classified, thoracic region, subsequent encounter for fracture with routine healing: Secondary | ICD-10-CM | POA: Diagnosis not present

## 2020-03-09 DIAGNOSIS — S32010D Wedge compression fracture of first lumbar vertebra, subsequent encounter for fracture with routine healing: Secondary | ICD-10-CM | POA: Diagnosis not present

## 2020-03-10 DIAGNOSIS — K2100 Gastro-esophageal reflux disease with esophagitis, without bleeding: Secondary | ICD-10-CM | POA: Diagnosis not present

## 2020-03-14 ENCOUNTER — Other Ambulatory Visit: Payer: Self-pay | Admitting: Family Medicine

## 2020-03-18 ENCOUNTER — Other Ambulatory Visit: Payer: Self-pay | Admitting: Rheumatology

## 2020-03-18 NOTE — Telephone Encounter (Signed)
Last Visit: 01/05/2020 Next Visit: 07/04/2020  Last fill: 12/14/2019   Okay to refill armodafinil?

## 2020-03-18 NOTE — Telephone Encounter (Signed)
ok 

## 2020-03-18 NOTE — Telephone Encounter (Signed)
Last Visit: 01/05/2020 Next Visit: 07/04/2020  Okay to refill per Dr. Deveshwar.  

## 2020-03-21 DIAGNOSIS — M545 Low back pain: Secondary | ICD-10-CM | POA: Diagnosis not present

## 2020-03-25 DIAGNOSIS — M545 Low back pain: Secondary | ICD-10-CM | POA: Diagnosis not present

## 2020-03-29 ENCOUNTER — Other Ambulatory Visit: Payer: Self-pay

## 2020-03-29 ENCOUNTER — Encounter: Payer: Medicare Other | Attending: Surgery | Admitting: Skilled Nursing Facility1

## 2020-03-29 DIAGNOSIS — Z6830 Body mass index (BMI) 30.0-30.9, adult: Secondary | ICD-10-CM | POA: Insufficient documentation

## 2020-03-29 DIAGNOSIS — E669 Obesity, unspecified: Secondary | ICD-10-CM | POA: Diagnosis not present

## 2020-03-29 DIAGNOSIS — Z713 Dietary counseling and surveillance: Secondary | ICD-10-CM | POA: Diagnosis not present

## 2020-03-29 NOTE — Progress Notes (Signed)
Bariatric Nutrition Follow-Up Visit Medical Nutrition Therapy    Post-Operative RYGB Surgery Surgery Date: 10/13/2019 NUTRITION ASSESSMENT    Anthropometrics  Start weight at [facility]: 225.8 lbs (date: 05/06/2018) Today's weight: 172.3  Body Composition Scale 12/28/2019 03/29/2020  Total Body Fat % 41.3 39  Visceral Fat 13 11  Fat-Free Mass % 58.6 60.9   Total Body Water % 43.8 44.9   Muscle-Mass lbs 27.5 27.2  Body Fat Displacement           Torso  lbs 48 41.5         Left Leg  lbs 9.6 8.3         Right Leg  lbs 9.6 8.3         Left Arm  lbs 4.8 4.1         Right Arm   lbs 4.8 4.1    Clinical  Medical hx: migraines, OSA, GERD, kidney stones, fibromyalgia Medications: topamax, protonix, lopressor, gabapentin, hydrocodone, xanax, abilify Labs: see list   Lifestyle & Dietary Hx  Pt arrives stating she fell and is now wearing a back brace due to 2 cracked vertebrate. Pt states with this stress she finds herself maybe emotional eating. to statess she has not been sleeping very well. Pt states she has bible study and crafting. Pt states her fibromyalgia is flaring too. Pt states she has medicine for her chronic fatigue. Pt states she is not exeperinecing as much reflux anymore.   Estimated daily fluid intake: 50+ oz Estimated daily protein intake: 60+ g Supplements: bar advantage, calcium  Current average weekly physical activity: was doing BELT but with new injury   24-Hr Dietary Recall daily protein shake First Meal: egg and cheese sometimes with canadian bacon or premier protein  Snack: yogurt Second Meal: salad or beans or ham and cheese  Snack: cheese stick or carrots and cauliflouer or nuts Third Meal: 3 ounces chicken or lean hamburger or steak with salad and vegetables  Snack: cheese  Beverages: alkaline water 9+; 46-50 ounces; with more occurences of 64+ than before; gatorade zero  Post-Op Goals/ Signs/ Symptoms Using straws: no Drinking while eating:  no Chewing/swallowing difficulties: no Changes in vision: no Changes to mood/headaches: no Hair loss/changes to skin/nails: no Difficulty focusing/concentrating: no Sweating: no Dizziness/lightheadedness: no Palpitations: no  Carbonated/caffeinated beverages: no N/V/D/C/Gas: no Abdominal pain: no Dumping syndrome: no    NUTRITION DIAGNOSIS  Overweight/obesity ([place]-3.3) related to past poor dietary habits and physical inactivity as evidenced by completed bariatric surgery and following dietary guidelines for continued weight loss and healthy nutrition status.     NUTRITION INTERVENTION Nutrition counseling (C-1) and education (E-2) to facilitate bariatric surgery goals, including: . The importance of consuming adequate calories as well as certain nutrients daily due to the body's need for essential vitamins, minerals, and fats . The importance of daily physical activity and to reach a goal of at least 150 minutes of moderate to vigorous physical activity weekly (or as directed by their physician) due to benefits such as increased musculature and improved lab values  Handouts Provided Include     Goals:  -Keep aiming for 64+ ounces of fluid -Keep up with the basics of a healthy diet -Avoid eating under every 3 hours (grazing)  -Stop eating at satisfaction not fullness   Learning Style & Readiness for Change Teaching method utilized: Visual & Auditory  Demonstrated degree of understanding via: Teach Back  Barriers to learning/adherence to lifestyle change: none identified   RD's Notes for  Next Visit . Assess adherence to pt chosen goals   MONITORING & EVALUATION Dietary intake, weekly physical activity, body weight:

## 2020-03-31 ENCOUNTER — Ambulatory Visit: Payer: Medicare Other | Admitting: Podiatry

## 2020-04-02 ENCOUNTER — Other Ambulatory Visit: Payer: Self-pay | Admitting: Family Medicine

## 2020-04-04 NOTE — Telephone Encounter (Signed)
Pt has upcoming appt 

## 2020-04-07 ENCOUNTER — Encounter: Payer: Self-pay | Admitting: Podiatry

## 2020-04-07 ENCOUNTER — Telehealth: Payer: Self-pay | Admitting: *Deleted

## 2020-04-07 ENCOUNTER — Other Ambulatory Visit: Payer: Self-pay

## 2020-04-07 ENCOUNTER — Ambulatory Visit: Payer: Medicare Other | Admitting: Podiatry

## 2020-04-07 DIAGNOSIS — L603 Nail dystrophy: Secondary | ICD-10-CM | POA: Diagnosis not present

## 2020-04-07 DIAGNOSIS — L608 Other nail disorders: Secondary | ICD-10-CM | POA: Diagnosis not present

## 2020-04-07 NOTE — Progress Notes (Signed)
Subjective:  Patient ID: [name], female    DOB: 1955/11/30,  MRN: [id number] HPI Chief Complaint  Patient presents with  . Nail Problem    Hallux nail bilateral - thick, discolored, lifting, peeling nails x years, no treatment  . New Patient (Initial Visit)    Est pt 2017    64 y.o. female presents with the above complaint.   ROS: Denies fever chills nausea vomiting muscle aches pains calf pain back pain chest pain shortness of breath.  Past Medical History:  Diagnosis Date  . Anemia    previously followed by Dr. Dalene Carrow for anemia and elevated platelets  . Anxiety   . C. difficile colitis 10/01/12   treated by [facility]  . Chronic fatigue syndrome   . DDD (degenerative disc disease), lumbar 08/19/14   and facet arthroplasty & left lumbar radiculopathy (Dr.Ramos)  . Depression   . Dyssynergia    dyssynergenic defecation, contributing to fecal incontinence.  . Edema   . Fibromyalgia   . Gastroparesis    followed at [facility]  . GERD (gastroesophageal reflux disease)   . History of kidney stones   . Hyperlipidemia   . Kidney [name]   . Lumbar radiculopathy   . Migraine   . Neuropathy   . Obstructive sleep apnea    Does  wear  CPAP  . Paresthesia    Dr. Antonietta Barcelona at [facility]  . Pelvic floor dysfunction    pelvic floor dyssynergy  . Plantar fasciitis 02/2011   R foot  . Pneumonia    2012  . PONV (postoperative nausea and vomiting)    pt states has gastroparesis has difficulty taking antibiotics and narcotics has severe nausea and vomiting   . Restless leg syndrome   . S/P endometrial ablation 08/09/2006   Novasure Ablation  . S/P epidural steroid injection 09/20/14   Dr.Ramos  . Tremor    Dr. Antonietta Barcelona  . Urinary frequency   . Urinary incontinence    Past Surgical History:  Procedure Laterality Date  . CHOLECYSTECTOMY  9/05  . ENDOMETRIAL ABLATION  08/09/2006   Dr. Kingsley Plan Ablation  . FACET JOINT INJECTION  04/17/2017   Left L4-5 and L5-S1  .  GASTRIC ROUX-EN-Y N/A 09/29/2019   Procedure: LAPAROSCOPIC ROUX-EN-Y GASTRIC BYPASS WITH UPPER ENDOSCOPY, ERAS Pathway;  Surgeon: Luretha Murphy, MD;  Location: [facility];  Service: General;  Laterality: N/A;  . KNEE ARTHROPLASTY    . KNEE SURGERY  1999   R knee, Dr. Renae Fickle, torn cartilage  . RETINAL LASER PROCEDURE Right 08/28/2018   laser retinopexy  . RIGHT/LEFT HEART CATH AND CORONARY ANGIOGRAPHY N/A 01/01/2018   Procedure: RIGHT/LEFT HEART CATH AND CORONARY ANGIOGRAPHY;  Surgeon: Tonny Bollman, MD;  Location[facility]VAS[facility] CV LAB;  Service: Cardiovascular;  Laterality: N/A;  . TONSILLECTOMY  1968  . TONSILLECTOMY    . TOTAL KNEE ARTHROPLASTY Right 09/05/2015   Procedure: RIGHT TOTAL KNEE ARTHROPLASTY;  Surgeon: Ollen Gross, MD;  Location: [facility];  Service: Orthopedics;  Laterality: Right;  . TOTAL KNEE ARTHROPLASTY Left 07/14/2018   Procedure: LEFT TOTAL KNEE ARTHROPLASTY;  Surgeon: Ollen Gross, MD;  Location: [facility];  Service: Orthopedics;  Laterality: Left;  . ULTRASOUND GUIDANCE FOR VASCULAR ACCESS  01/01/2018   Procedure: Ultrasound Guidance For Vascular Access;  Surgeon: Tonny Bollman, MD;  Location: [facility] INVASIVE CV LAB;  Service: Cardiovascular;;  . UMBILICAL HERNIA REPAIR N/A 09/29/2019   Procedure: HERNIA REPAIR UMBILICAL ADULT;  Surgeon: Luretha Murphy, MD;  Location: [facility];  Service: General;  Laterality: N/A;    Current Outpatient Medications:  .  acetaminophen (TYLENOL) 500 MG tablet, Take 1,000 mg by mouth 3 (three) times daily as needed for moderate pain., Disp: , Rfl:  .  albuterol (PROVENTIL HFA;VENTOLIN HFA) 108 (90 Base) MCG/ACT inhaler, Inhale 1-2 puffs into the lungs every 6 (six) hours as needed for wheezing or shortness of breath. (Patient not taking: Reported on 01/18/2020), Disp: 1 Inhaler, Rfl: 0 .  allopurinol (ZYLOPRIM) 300 MG tablet, TAKE 1 TABLET BY MOUTH EVERY DAY, Disp: 90 tablet, Rfl: 0 .  ALPRAZolam (XANAX) 0.25 MG tablet, Take 1 tablet (0.25 mg total) by  mouth 3 (three) times daily as needed for anxiety., Disp: 90 tablet, Rfl: 2 .  ARIPiprazole (ABILIFY) 5 MG tablet, Take 1 tablet (5 mg total) by mouth daily., Disp: 90 tablet, Rfl: 1 .  Armodafinil 250 MG tablet, TAKE 1 TABLET EVERY DAY WITH BREAKFAST, Disp: 30 tablet, Rfl: 2 .  atorvastatin (LIPITOR) 40 MG tablet, TAKE 1 TABLET BY MOUTH EVERY DAY, Disp: 90 tablet, Rfl: 1 .  Calcium-Vitamin D-Vitamin K (CALCIUM SOFT CHEWS PO), Take 1,500 mg by mouth daily., Disp: , Rfl:  .  cetirizine (ZYRTEC) 10 MG tablet, Take 10 mg by mouth at bedtime., Disp: , Rfl:  .  colchicine 0.6 MG tablet, TAKE 1 TABLET BY MOUTH DAILY AS NEEDED DURING GOUT FLARES. (Patient not taking: Reported on 01/18/2020), Disp: 30 tablet, Rfl: 0 .  DULoxetine (CYMBALTA) 60 MG capsule, Take 1 capsule (60 mg total) by mouth 2 (two) times daily., Disp: 180 capsule, Rfl: 1 .  famotidine (PEPCID) 20 MG tablet, Take 20 mg by mouth 2 (two) times daily., Disp: , Rfl:  .  fluticasone (FLONASE) 50 MCG/ACT nasal spray, PLACE 2 SPRAYS INTO BOTH NOSTRILS DAILY AS NEEDED FOR ALLERGIES (Patient taking differently: Place 2 sprays into both nostrils at bedtime. ), Disp: 16 g, Rfl: 1 .  gabapentin (NEURONTIN) 300 MG capsule, Take 1 capsule (300 mg total) by mouth 3 (three) times daily. Gabapentin 300 mg Protocol Take a 300 mg capsule three times a day for two weeks following surgery. Then take a 300 mg capsule two times a day for two weeks.  Then resume a 300 mg capsule once a day at bedtime. (Patient taking differently: Take 300 mg by mouth 3 (three) times daily. ), Disp: 84 capsule, Rfl: 0 .  HYDROcodone-acetaminophen (NORCO/VICODIN) 5-325 MG tablet, Take 1 tablet by mouth every 6 (six) hours as needed for moderate pain. (Patient not taking: Reported on 01/05/2020), Disp: 15 tablet, Rfl: 0 .  meclizine (ANTIVERT) 25 MG tablet, Take 1 tablet (25 mg total) by mouth 3 (three) times daily as needed for dizziness. (Patient not taking: Reported on 01/18/2020), Disp:  30 tablet, Rfl: 0 .  Melatonin 10 MG TABS, Take 10 mg by mouth at bedtime., Disp: , Rfl:  .  methocarbamol (ROBAXIN) 500 MG tablet, TAKE 1 TABLET (500 MG TOTAL) BY MOUTH 2 (TWO) TIMES DAILY AS NEEDED FOR MUSCLE SPASMS.-NOT COVERED, Disp: 60 tablet, Rfl: 0 .  metoprolol tartrate (LOPRESSOR) 50 MG tablet, TAKE 1 TABLET BY MOUTH TWICE A DAY, Disp: 180 tablet, Rfl: 0 .  Multiple Vitamins-Minerals (BARIATRIC MULTIVITAMINS/IRON PO), Take 2 each by mouth daily., Disp: , Rfl:  .  Multiple Vitamins-Minerals (ICAPS AREDS 2) CAPS, Take 1 capsule by mouth 2 (two) times daily., Disp: , Rfl:  .  mupirocin ointment (BACTROBAN) 2 %, mupirocin 2 % topical ointment, Disp: , Rfl:  .  nortriptyline (  PAMELOR) 50 MG capsule, Take 150 mg by mouth at bedtime., Disp: , Rfl:  .  ondansetron (ZOFRAN) 8 MG tablet, Take 8 mg by mouth every 8 (eight) hours as needed for nausea. , Disp: , Rfl: 0 .  pantoprazole (PROTONIX) 40 MG tablet, Take 1 tablet (40 mg total) by mouth daily., Disp: 90 tablet, Rfl: 0 .  Probiotic Product (PHILLIPS COLON HEALTH) CAPS, Take 1 capsule by mouth at bedtime. , Disp: , Rfl:  .  Propylene Glycol (SYSTANE COMPLETE) 0.6 % SOLN, Place 1 drop into both eyes daily at 12 noon. , Disp: , Rfl:  .  RESTASIS 0.05 % ophthalmic emulsion, 1 drop 2 (two) times daily., Disp: , Rfl:  .  topiramate (TOPAMAX) 25 MG tablet, TAKE 2 TABLETS (50 MG TOTAL) BY MOUTH AT BEDTIME, Disp: 180 tablet, Rfl: 0 .  XIIDRA 5 % SOLN, Apply 1 drop to eye 2 (two) times daily., Disp: , Rfl:   Allergies  Allergen Reactions  . Erythromycin Nausea Only    Abdominal pain  . Tramadol Itching  . Dilaudid [Hydromorphone Hcl] Itching   Review of Systems Objective:  There were no vitals filed for this visit.  General: Well developed, nourished, in no acute distress, alert and oriented x3   Dermatological: Skin is warm, dry and supple bilateral. Nails x 10 are well maintained hallux nails bilaterally do demonstrate a thickening  discoloration with subungual debris consistent with possible onychomycosis; remaining integument appears unremarkable at this time. There are no open sores, no preulcerative lesions, no rash or signs of infection present.  Vascular: Dorsalis Pedis artery and Posterior Tibial artery pedal pulses are 2/4 bilateral with immedate capillary fill time. Pedal hair growth present. No varicosities and no lower extremity edema present bilateral.   Neruologic: Grossly intact via light touch bilateral. Vibratory intact via tuning fork bilateral. Protective threshold with Semmes Wienstein monofilament intact to all pedal sites bilateral. Patellar and Achilles deep tendon reflexes 2+ bilateral. No Babinski or clonus noted bilateral.   Musculoskeletal: No gross boney pedal deformities bilateral. No pain, crepitus, or limitation noted with foot and ankle range of motion bilateral. Muscular strength 5/5 in all groups tested bilateral.  Gait: Unassisted, Nonantalgic.    Radiographs:  None taken  Assessment & Plan:   Assessment: Nail dystrophy cannot rule out onychomycosis.  Plan: Samples of the skin and nail were taken today to be sent for pathologic evaluation once this returns we will discuss laser therapy versus oral medication and will discuss this with her primary care provider.     Huma Imhoff T. [place], [place]

## 2020-04-07 NOTE — Telephone Encounter (Signed)
  Patient Consent for Virtual Visit         [name] has provided verbal consent on 04/07/2020 for a virtual visit (video or telephone).   CONSENT FOR VIRTUAL VISIT FOR:  [name]  By participating in this virtual visit I agree to the following:  I hereby voluntarily request, consent and authorize [facility] and its employed or contracted physicians, physician assistants, nurse practitioners or other licensed health care professionals (the Practitioner), to provide me with telemedicine health care services (the "Services") as deemed necessary by the treating Practitioner. I acknowledge and consent to receive the Services by the Practitioner via telemedicine. I understand that the telemedicine visit will involve communicating with the Practitioner through live audiovisual communication technology and the disclosure of certain medical information by electronic transmission. I acknowledge that I have been given the opportunity to request an in-person assessment or other available alternative prior to the telemedicine visit and am voluntarily participating in the telemedicine visit.  I understand that I have the right to withhold or withdraw my consent to the use of telemedicine in the course of my care at any time, without affecting my right to future care or treatment, and that the Practitioner or I may terminate the telemedicine visit at any time. I understand that I have the right to inspect all information obtained and/or recorded in the course of the telemedicine visit and may receive copies of available information for a reasonable fee.  I understand that some of the potential risks of receiving the Services via telemedicine include:  Marland Kitchen Delay or interruption in medical evaluation due to technological equipment failure or disruption; . Information transmitted may not be sufficient (e.g. poor resolution of images) to allow for appropriate medical decision making by the Practitioner;  and/or  . In rare instances, security protocols could fail, causing a breach of personal health information.  Furthermore, I acknowledge that it is my responsibility to provide information about my medical history, conditions and care that is complete and accurate to the best of my ability. I acknowledge that Practitioner's advice, recommendations, and/or decision may be based on factors not within their control, such as incomplete or inaccurate data provided by me or distortions of diagnostic images or specimens that may result from electronic transmissions. I understand that the practice of medicine is not an exact science and that Practitioner makes no warranties or guarantees regarding treatment outcomes. I acknowledge that a copy of this consent can be made available to me via my patient portal ([facility]), or I can request a printed copy by calling the office of [facility].    I understand that my insurance will be billed for this visit.   I have read or had this consent read to me. . I understand the contents of this consent, which adequately explains the benefits and risks of the Services being provided via telemedicine.  . I have been provided ample opportunity to ask questions regarding this consent and the Services and have had my questions answered to my satisfaction. . I give my informed consent for the services to be provided through the use of telemedicine in my medical care

## 2020-04-08 DIAGNOSIS — Z8709 Personal history of other diseases of the respiratory system: Secondary | ICD-10-CM | POA: Diagnosis not present

## 2020-04-08 DIAGNOSIS — M797 Fibromyalgia: Secondary | ICD-10-CM | POA: Diagnosis not present

## 2020-04-08 DIAGNOSIS — M109 Gout, unspecified: Secondary | ICD-10-CM | POA: Diagnosis not present

## 2020-04-08 DIAGNOSIS — M545 Low back pain: Secondary | ICD-10-CM | POA: Diagnosis not present

## 2020-04-11 NOTE — Progress Notes (Signed)
Virtual Visit via Video Note   This visit type was conducted due to national recommendations for restrictions regarding the COVID-19 Pandemic (e.g. social distancing) in an effort to limit this patient's exposure and mitigate transmission in our community.  Due to her co-morbid illnesses, this patient is at least at moderate risk for complications without adequate follow up.  This format is felt to be most appropriate for this patient at this time.  All issues noted in this document were discussed and addressed.  A limited physical exam was performed with this format.  Please refer to the patient's chart for her consent to telehealth for [facility].   Evaluation Performed:  Follow-up visit  This visit type was conducted due to national recommendations for restrictions regarding the COVID-19 Pandemic (e.g. social distancing).  This format is felt to be most appropriate for this patient at this time.  All issues noted in this document were discussed and addressed.  No physical exam was performed (except for noted visual exam findings with Video Visits).  Please refer to the patient's chart (MyChart message for video visits and phone note for telephone visits) for the patient's consent to telehealth for [facility].  Date:  04/12/2020   ID:  JUSTIN BROWNFIELD, DOB [birth year], MRN [id number]  Patient Location:  Home  Provider location:   [place]  PCP:  Rita Ohara, MD  Cardiologist:  Jenkins Rouge, MD  Sleep Medicine:  Fransico Him, MD Electrophysiologist:  None   Chief Complaint:  OSA  History of Present Illness:    [name] is a 64 y.o. female who presents via audio/video conferencing for a telehealth visit today.    Leah B Bradyis a 64 y.o.femalewith a hx of chest pain with normal heart cath, mild pulmonary HTN at cath with normal LVEDP followed by Dr. Johnsie Cancel.  She also has obesity and OSA on CPAP which I follow.  SHe is doing well with her CPAP device and thinks that she  has gotten used to it.  She tolerates the mask and feels the pressure is adequate.  Unfortunately she sometimes will take her mask off during the night and will wake up with it off.  Sometimes she will wake up and put it back on.  Usually she goes to bed around 11-12MN and gets up around 7-8am.  She admits to not using her device every night because she stays with her mom some nights.  On the nights that she uses CPAP ang after weight loss surgery she feels more rested in the am and has no significant daytime sleepiness.  She had bariatric surgery last fall but is still having fatigue.  She has significant mouth dryness but has not adjusted the humidity on her device.  She does not think that she snores.    The patient does not have symptoms concerning for COVID-19 infection (fever, chills, cough, or new shortness of breath).   Prior CV studies:   The following studies were reviewed today:  PAP compliance download  Past Medical History:  Diagnosis Date  . Anemia    previously followed by Dr. Jamse Arn for anemia and elevated platelets  . Anxiety   . C. difficile colitis 10/01/12   treated by [facility]  . Chronic fatigue syndrome   . DDD (degenerative disc disease), lumbar 08/19/14   and facet arthroplasty & left lumbar radiculopathy (Dr.Ramos)  . Depression   . Dyssynergia    dyssynergenic defecation, contributing to fecal incontinence.  . Edema   .  Fibromyalgia   . Gastroparesis    followed at [facility]  . GERD (gastroesophageal reflux disease)   . History of kidney stones   . Hyperlipidemia   . Kidney stone   . Lumbar radiculopathy   . Migraine   . Neuropathy   . Obstructive sleep apnea    Does  wear  CPAP  . Paresthesia    Dr. Everette Rank at [facility]  . Pelvic floor dysfunction    pelvic floor dyssynergy  . Plantar fasciitis 02/2011   R foot  . Pneumonia    2012  . PONV (postoperative nausea and vomiting)    pt states has gastroparesis has difficulty taking antibiotics and  narcotics has severe nausea and vomiting   . Restless leg syndrome   . S/P endometrial ablation 08/09/2006   Novasure Ablation  . S/P epidural steroid injection 09/20/14   Dr.Ramos  . Tremor    Dr. Everette Rank  . Urinary frequency   . Urinary incontinence    Past Surgical History:  Procedure Laterality Date  . CHOLECYSTECTOMY  9/05  . ENDOMETRIAL ABLATION  08/09/2006   Dr. Valentina Shaggy Ablation  . FACET JOINT INJECTION  04/17/2017   Left L4-5 and L5-S1  . GASTRIC ROUX-EN-Y N/A 09/29/2019   Procedure: LAPAROSCOPIC ROUX-EN-Y GASTRIC BYPASS WITH UPPER ENDOSCOPY, ERAS Pathway;  Surgeon: Johnathan Hausen, MD;  Location: [facility];  Service: General;  Laterality: N/A;  . KNEE ARTHROPLASTY    . KNEE SURGERY  1999   R knee, Dr. Eddie Dibbles, torn cartilage  . RETINAL LASER PROCEDURE Right 08/28/2018   laser retinopexy  . RIGHT/LEFT HEART CATH AND CORONARY ANGIOGRAPHY N/A 01/01/2018   Procedure: RIGHT/LEFT HEART CATH AND CORONARY ANGIOGRAPHY;  Surgeon: Sherren Mocha, MD;  Location: [place] [facility];  Service: Cardiovascular;  Laterality: N/A;  . TONSILLECTOMY  1968  . TONSILLECTOMY    . TOTAL KNEE ARTHROPLASTY Right 09/05/2015   Procedure: RIGHT TOTAL KNEE ARTHROPLASTY;  Surgeon: Gaynelle Arabian, MD;  Location: [facility];  Service: Orthopedics;  Laterality: Right;  . TOTAL KNEE ARTHROPLASTY Left 07/14/2018   Procedure: LEFT TOTAL KNEE ARTHROPLASTY;  Surgeon: Gaynelle Arabian, MD;  Location: [facility];  Service: Orthopedics;  Laterality: Left;  . ULTRASOUND GUIDANCE FOR VASCULAR ACCESS  01/01/2018   Procedure: Ultrasound Guidance For Vascular Access;  Surgeon: Sherren Mocha, MD;  Location: [place] [facility];  Service: Cardiovascular;;  . UMBILICAL HERNIA REPAIR N/A 09/29/2019   Procedure: HERNIA REPAIR UMBILICAL ADULT;  Surgeon: Johnathan Hausen, MD;  Location: [facility];  Service: General;  Laterality: N/A;     Current Meds  Medication Sig  . acetaminophen (TYLENOL) 500 MG tablet Take 1,000 mg by mouth 3  (three) times daily as needed for moderate pain.  Marland Kitchen allopurinol (ZYLOPRIM) 300 MG tablet TAKE 1 TABLET BY MOUTH EVERY DAY  . ALPRAZolam (XANAX) 0.25 MG tablet Take 1 tablet (0.25 mg total) by mouth 3 (three) times daily as needed for anxiety.  . ARIPiprazole (ABILIFY) 5 MG tablet Take 1 tablet (5 mg total) by mouth daily.  . Armodafinil 250 MG tablet TAKE 1 TABLET EVERY DAY WITH BREAKFAST  . atorvastatin (LIPITOR) 40 MG tablet TAKE 1 TABLET BY MOUTH EVERY DAY  . CALCIUM PO Take 500 mg by mouth in the morning, at noon, and at bedtime.  . cetirizine (ZYRTEC) 10 MG tablet Take 10 mg by mouth at bedtime.  . colchicine 0.6 MG tablet TAKE 1 TABLET BY MOUTH DAILY AS NEEDED DURING GOUT FLARES.  . DULoxetine (CYMBALTA) 60 MG  capsule Take 1 capsule (60 mg total) by mouth 2 (two) times daily.  . fluticasone (FLONASE) 50 MCG/ACT nasal spray PLACE 2 SPRAYS INTO BOTH NOSTRILS DAILY AS NEEDED FOR ALLERGIES (Patient taking differently: Place 2 sprays into both nostrils at bedtime. )  . gabapentin (NEURONTIN) 300 MG capsule Take 300 mg by mouth 3 (three) times daily.  . meclizine (ANTIVERT) 25 MG tablet Take 1 tablet (25 mg total) by mouth 3 (three) times daily as needed for dizziness.  . Melatonin 10 MG TABS Take 5 mg by mouth at bedtime.   . methocarbamol (ROBAXIN) 500 MG tablet TAKE 1 TABLET (500 MG TOTAL) BY MOUTH 2 (TWO) TIMES DAILY AS NEEDED FOR MUSCLE SPASMS.-NOT COVERED  . metoprolol tartrate (LOPRESSOR) 50 MG tablet TAKE 1 TABLET BY MOUTH TWICE A DAY  . Multiple Vitamins-Minerals (BARIATRIC MULTIVITAMINS/IRON PO) Take 2 each by mouth daily.  . Multiple Vitamins-Minerals (ICAPS AREDS 2) CAPS Take 1 capsule by mouth 2 (two) times daily.  . nortriptyline (PAMELOR) 50 MG capsule Take 150 mg by mouth at bedtime.  . ondansetron (ZOFRAN) 8 MG tablet Take 8 mg by mouth every 8 (eight) hours as needed for nausea.   . pantoprazole (PROTONIX) 40 MG tablet Take 1 tablet (40 mg total) by mouth daily.  . Probiotic  Product (Avinger) CAPS Take 1 capsule by mouth at bedtime.   Marland Kitchen Propylene Glycol (SYSTANE COMPLETE) 0.6 % SOLN Place 1 drop into both eyes daily at 12 noon.   . RESTASIS 0.05 % ophthalmic emulsion 1 drop 2 (two) times daily.  Marland Kitchen topiramate (TOPAMAX) 25 MG tablet TAKE 2 TABLETS (50 MG TOTAL) BY MOUTH AT BEDTIME     Allergies:   Erythromycin, Tramadol, and Dilaudid [hydromorphone hcl]   Social History   Tobacco Use  . Smoking status: Never Smoker  . Smokeless tobacco: Never Used  Substance Use Topics  . Alcohol use: No    Alcohol/week: 0.0 standard drinks  . Drug use: No     Family Hx: The patient's family history includes Allergies in her mother and sister; Asthma in her sister; Cancer in her maternal aunt and maternal aunt; Colon cancer in her maternal aunt; Diabetes in her father; Heart disease in her father, mother, paternal grandfather, and paternal grandmother; Hypertension in her father and mother; Irritable bowel syndrome in her sister; Kidney disease in her father; Macular degeneration in her mother; Stroke (age of onset: 25) in her father. There is no history of CAD.  ROS:   Please see the history of present illness.     All other systems reviewed and are negative.   Labs/Other Tests and Data Reviewed:    Recent Labs: 11/02/2019: ALT 31; BUN 19; Creatinine, Ser 0.79; Hemoglobin 13.2; Platelets 244; Potassium 3.9; Sodium 144   Recent Lipid Panel Lab Results  Component Value Date/Time   CHOL 105 11/02/2019 09:15 AM   TRIG 156 (H) 11/02/2019 09:15 AM   HDL 37 (L) 11/02/2019 09:15 AM   CHOLHDL 2.8 11/02/2019 09:15 AM   CHOLHDL 3.7 12/07/2017 05:00 AM   LDLCALC 42 11/02/2019 09:15 AM   LDLCALC 68 10/03/2017 10:20 AM    Wt Readings from Last 3 Encounters:  04/12/20 168 lb (76.2 kg)  03/29/20 172 lb 4.8 oz (78.2 kg)  01/18/20 182 lb 9.6 oz (82.8 kg)     Objective:    Vital Signs:  BP 101/67   Pulse 74   Ht 5' 2.5" (1.588 m)   Wt 168 lb (76.2 kg)  LMP 07/27/2006   BMI 30.24 kg/m    CONSTITUTIONAL:  Well nourished, well developed female in no acute distress.  EYES: anicteric MOUTH: oral mucosa is pink RESPIRATORY: Normal respiratory effort, symmetric expansion CARDIOVASCULAR: No peripheral edema SKIN: No rash, lesions or ulcers MUSCULOSKELETAL: no digital cyanosis NEURO: Cranial Nerves II-XII grossly intact, moves all extremities PSYCH: Intact judgement and insight.  A&O x 3, Mood/affect appropriate   ASSESSMENT & PLAN:    1.  OSA -  The patient is tolerating PAP therapy well without any problems. The PAP download was reviewed today and showed an AHI of 4.9/hr on auto PAP with 43% compliance in using more than 4 hours nightly.  The patient has been using and benefiting from PAP use and will continue to benefit from therapy.  -I have encouraged her to try to be more compliant with her device -I encouraged her to try to adjust the humidity as well -her dry mouth is likely due to some of the meds she takes  2.  Obesity -She is s/p weight loss surgery last fall and has lost 58lbs -I have encouraged her to get into a routine exercise program but right now she is limited after hurting her back and is seeing ortho   COVID-19 Education: The signs and symptoms of COVID-19 were discussed with the patient and how to seek care for testing (follow up with PCP or arrange E-visit).  The importance of social distancing was discussed today.  Patient Risk:   After full review of this patient's clinical status, I feel that they are at least moderate risk at this time.  Time:   Today, I have spent 20 minutes on telemedicine discussing medical problems including OSA and Obesity and reviewing patient's chart including PAP compliance download.  Medication Adjustments/Labs and Tests Ordered: Current medicines are reviewed at length with the patient today.  Concerns regarding medicines are outlined above.  Tests Ordered: No orders of the defined  types were placed in this encounter.  Medication Changes: No orders of the defined types were placed in this encounter.   Disposition:  Follow up in 1 year(s)  Signed, Fransico Him, MD  04/12/2020 10:31 AM    [facility]

## 2020-04-12 ENCOUNTER — Other Ambulatory Visit: Payer: Self-pay

## 2020-04-12 ENCOUNTER — Telehealth (INDEPENDENT_AMBULATORY_CARE_PROVIDER_SITE_OTHER): Payer: Medicare Other | Admitting: Cardiology

## 2020-04-12 VITALS — BP 101/67 | HR 74 | Ht 62.5 in | Wt 168.0 lb

## 2020-04-12 DIAGNOSIS — G4733 Obstructive sleep apnea (adult) (pediatric): Secondary | ICD-10-CM | POA: Diagnosis not present

## 2020-04-18 ENCOUNTER — Telehealth: Payer: Self-pay | Admitting: Family Medicine

## 2020-04-18 NOTE — Telephone Encounter (Signed)
Received bone density from [facility]

## 2020-04-20 ENCOUNTER — Encounter: Payer: Self-pay | Admitting: *Deleted

## 2020-04-20 DIAGNOSIS — M545 Low back pain: Secondary | ICD-10-CM | POA: Diagnosis not present

## 2020-04-20 DIAGNOSIS — M47817 Spondylosis without myelopathy or radiculopathy, lumbosacral region: Secondary | ICD-10-CM | POA: Diagnosis not present

## 2020-04-22 ENCOUNTER — Telehealth: Payer: Self-pay | Admitting: *Deleted

## 2020-04-22 NOTE — Telephone Encounter (Signed)
I informed pt of Dr. Stephenie Acres review of results and orders. Pt requested the 05/10/2020 appt canceled.

## 2020-04-22 NOTE — Telephone Encounter (Signed)
-----   Message from Garrel Ridgel, [place] sent at 04/20/2020  5:06 PM EDT ----- Negative for fungus and positive for chronic trauma resulting in nail dystrophy.  Do not see this patient for this treatment.

## 2020-04-29 DIAGNOSIS — M545 Low back pain: Secondary | ICD-10-CM | POA: Diagnosis not present

## 2020-05-01 NOTE — Patient Instructions (Addendum)
  HEALTH MAINTENANCE RECOMMENDATIONS:  It is recommended that you get at least 30 minutes of aerobic exercise at least 5 days/week (for weight loss, you may need as much as 60-90 minutes). This can be any activity that gets your heart rate up. This can be divided in 10-15 minute intervals if needed, but try and build up your endurance at least once a week.  Weight bearing exercise is also recommended twice weekly.  Eat a healthy diet with lots of vegetables, fruits and fiber.  "Colorful" foods have a lot of vitamins (ie green vegetables, tomatoes, red peppers, etc).  Limit sweet tea, regular sodas and alcoholic beverages, all of which has a lot of calories and sugar.  Up to 1 alcoholic drink daily may be beneficial for women (unless trying to lose weight, watch sugars).  Drink a lot of water.  Calcium recommendations are 1200-1500 mg daily (1500 mg for postmenopausal women or women without ovaries), and vitamin D 1000 IU daily.  This should be obtained from diet and/or supplements (vitamins), and calcium should not be taken all at once, but in divided doses.  Monthly self breast exams and yearly mammograms for women over the age of 82 is recommended.  Sunscreen of at least SPF 30 should be used on all sun-exposed parts of the skin when outside between the hours of 10 am and 4 pm (not just when at beach or pool, but even with exercise, golf, tennis, and yard work!)  Use a sunscreen that says "broad spectrum" so it covers both UVA and UVB rays, and make sure to reapply every 1-2 hours.  Remember to change the batteries in your smoke detectors when changing your clock times in the spring and fall. Carbon monoxide detectors are recommended for your home.  Use your seat belt every time you are in a car, and please drive safely and not be distracted with cell phones and texting while driving.   [name] , Thank you for taking time to come for your Medicare Wellness Visit. I appreciate your ongoing  commitment to your health goals. Please review the following plan we discussed and let me know if I can assist you in the future.    This is a list of the screening recommended for you and due dates:  Health Maintenance  Topic Date Due  . Flu Shot  06/26/2020  . Pap Smear  09/09/2021  . Mammogram  09/21/2020  . Colon Cancer Screening  12/04/2022  . Tetanus Vaccine  06/21/2027  . COVID-19 Vaccine  Completed  .  Hepatitis C: One time screening is recommended by Center for Disease Control  (CDC) for  adults born from [birth year] through 1964.   Completed  . HIV Screening  Completed  . Urine Protein Check  Discontinued   I recommend that you get another bone density test now (rather than in 2023 and suggested by GYN in the past ,based on the normal one done in 2018), due to you having 2 compression fractures in your spine after the fall in March.  It is best to be done on the same machine, for comparison, so mention this to your GYN.  Consider cutting back on dairy (milk products) and look to see if certain foods trigger gas, and then avoid these foods (broccoli, beans, spinach, etc).  Versus taking Lactaid or Beano prior to eating these foods (lactaid prior to dairy, Beano prior to the other foods).

## 2020-05-01 NOTE — Progress Notes (Signed)
Chief Complaint  Patient presents with  . Annual Exam    mwv no other issues, pt. did have DEXA in 2020, very rarely has issue with SOB, last PAP was 2020, exercise walks daily    [name] is a 64 y.o. female who presents for annual physical exam, Medicare wellness visit and follow-up on chronic medical conditions.   She has the following concerns:  She is complaining about passing a lot of gas.  She denies abdominal pain or change in bowels, just passing a lot of gas which is embarrassing. No specific changes in her diet that is triggering this, that she is aware of.  She had T12 and L1 compression fractures after a fall in 01/2020. Foot got caught on a table leg when getting up from a table at a restaurant (thinks she twisted some as she fell).  She has been seeing Dr. Tonita Cong, who referred her for PT. She is much better now than in March.  She has had only 2 PT sessions so far. They are in the process of trying to wean her off the brace. DEXA in 2018 was normal.  Pt stated she had another one in 2020 through GYN. We double checked by calling over there, and the last DEXA she had was the one we had the results for in 2018 (hasn't had one since).  She isn't sure if it was a "compression fracture".  She said she had an MRI through ortho, and that she had "cracked vertebrae".  Her pain has been improving.  Pre-diabetes and obesity:She had Roux-En-Y on 09/29/2019.  She is still seeing nutritionist (last in 03/2020). She reports she hit a plateau with weight loss. She hasn't been able to get back to her exercise program since her fall in March.  She only made it through 4 weeks of the 16 week program.  She is currently getting PT.  She has been eating a lot of fruit.  Lab Results  Component Value Date   HGBA1C 5.5 11/02/2019   Hyperlipidemia follow-up: Patient is trying to follow a low-fat, low cholesterol diet as best she can. Compliant with medications (atorvastatin 22m) and denies medication  side effects. Lab Results  Component Value Date   CHOL 105 11/02/2019   HDL 37 (L) 11/02/2019   LDLCALC 42 11/02/2019   TRIG 156 (H) 11/02/2019   CHOLHDL 2.8 11/02/2019   Hypertension: (never formally diagnosed, because hadbeen on diuretics for fluid retention (stopped a while ago) and beta blockers fortachycardia andtremor--but high BP's noted when not taking these medications in past).  She continues on metoprolol 529mBID.  She periodically checks her BP at home, 115-125/70's. Denies chest pain, palpitations/tachycardia, dizziness, edema.  She is under the care of Dr. DeEstanislado Pandyforfibromyalgia,osteoarthritis,degenerative disc disease, chronic fatigue. She last saw rheum in 12/2019, at which time she got trigger point injections in her trapezius muscles.  She continues on cymbalta, gabapentin, and robaxin 50089mID prn. She also prescribes her armodafinil for her chronic fatigue.   Gout--she continues on allopurinol 300m85mily, and hasn't had any flares in several years. She has colchicine to use prn flares. Lab Results  Component Value Date   LABURIC 5.8 11/02/2019   She has OSA, under the care of Dr. TurnRadford Pax last saw her in 03/2020.  There were some compliance issues with use of CPAP (when she is with her mom, and sometimes takes it off during the night).  Overall, she is doing well and should continue use  of CPAP.Energy is improved, but still requires Nuvigil.  She saw neurologist (Dr. Richardean Canal 01/2020. She isongabapentin for restless legs as well as her neuropathy pain. (previously also took clonazepam, stopped a while ago, poss 2017 per chart).  On beta blocker for tremor. On topamax and pamelor for migraine prevention (though Dr. Estanislado Pandy now prescribes the topamax). She sees him for migraines, tremor(benign essential vs accentuated physiological tremor), neuropathy(pedal paresthesias),and restless leg syndrome. He has also noted episodic asterixis and transient  R hand paresthesias, possible median neuropathy. She gets migrainesevery 3-4 months or less, but is having frequent headaches overall. She is getting temporal headaches most days, and taking tylenol frequently, which helps some (not always). Denies clenching/grinding teeth. She hasn't paid attention to know if muscle relaxant has helped with her headaches. Not currently having sinus headaches.  Depression:Sees Regina at [facility], and is doing well on Cymbalta and Abilify.  She has had dyspnea on exertion since her knee surgery in August, 2019.It is episodic.She has seen cardiologist and pulmonologist. Dr. Halford Chessman felt that given that extensive testing was unrevealing for a cause (cardiac/pulmonary), symptoms may be related to obesity and deconditioning. She hadno benefit from Qvar trial. DOE is infrequent now, much better overall since bariatric surgery.  GERD and h/o gastroparesis: Protonix was cut back to once daily after her Roux-en-Y. Denies any nausea and vomiting, gastroparesis is much better. Only rarely needs zofran.  Immunization History  Administered Date(s) Administered  . Influenza Inj Mdck Quad Pf 09/13/2019  . Influenza Split 09/17/2011, 09/03/2012, 09/04/2013  . Influenza,inj,Quad PF,6+ Mos 08/16/2014, 07/27/2015, 07/31/2017, 08/13/2018  . Influenza-Unspecified 10/04/2016, 07/31/2017, 09/23/2017  . PFIZER SARS-COV-2 Vaccination 02/20/2020, 03/12/2020  . Tdap 01/16/2011, 06/20/2017  . Zoster Recombinat (Shingrix) 02/05/2019, 07/14/2019   Last Pap smear: 08/2018, Dr. Cyndie Chime Last mammogram: 08/2019 Last colonoscopy: 11/2012 at [place] normal; repeat recommended in 5 years.  She was wrong in thinking she had repeat in 2020--we called over to GYN, and last one was in 06/2017. Dentist: twice yearly Ophtho:yearly Exercise: Vitamin D-OH 32 in 11/2015 Thyroid: Lab Results  Component Value Date   TSH 1.632 12/06/2017    Other doctors caring  for patient include: GYN:Dr. Fontaine Neuro: Dr. Everette Rank Sleep med/OSA: Dr. Fransico Him ((haspreviouslyseen Dr. Brett Fairy for OSAin past) Rheum: Dr. Estanislado Pandy Psych: Rollene Fare (previously Dr. Caprice Beaver) GI: Dr. Derrill Kay, Dr. Roney Mans Podiatry: Dr. Milinda Pointer Ophtho:at [facility] Dentist:Dr. Deniece Ree Urologist: Dr. Jeffie Pollock Pulmonologist: Dr. Deland Pretty Cardiologist: Dr. Johnsie Cancel Orthopedist:Aluisio, Maxie Better Derm: Dr. Ubaldo Glassing Bariatric surgeon: Dr. Hassell Done  Depression screen: negative Fall screen: 01/2020, compression fractures; 1 other fall without any injury. Functional Status Survey: notable for shortness of breath with activity (overall significantly improved), leakage of urine. Some concentration problems (long-term, unchanged) Mini-Cog screen: normal See full screens in Epic  She has advanced directives (living will, healthcare power of attorney). Scanned in chart in 05/2019.  PMH, PSH, SH and FH were updated and reviewed.  Outpatient Encounter Medications as of 05/02/2020  Medication Sig Note  . acetaminophen (TYLENOL) 500 MG tablet Take 1,000 mg by mouth 3 (three) times daily as needed for moderate pain. 05/02/2020: Taking 2-3 times/day for back pain and headache. Not every day  . allopurinol (ZYLOPRIM) 300 MG tablet TAKE 1 TABLET BY MOUTH EVERY DAY   . ALPRAZolam (XANAX) 0.25 MG tablet Take 1 tablet (0.25 mg total) by mouth 3 (three) times daily as needed for anxiety. 05/02/2020: Averages taking about 1/day (taking recently, stress related to husband's health)  . ARIPiprazole (ABILIFY) 5 MG tablet  Take 1 tablet (5 mg total) by mouth daily.   . Armodafinil 250 MG tablet TAKE 1 TABLET EVERY DAY WITH BREAKFAST   . atorvastatin (LIPITOR) 40 MG tablet TAKE 1 TABLET BY MOUTH EVERY DAY   . CALCIUM PO Take 500 mg by mouth in the morning, at noon, and at bedtime.   . cetirizine (ZYRTEC) 10 MG tablet Take 10 mg by mouth at bedtime.   . DULoxetine (CYMBALTA) 60 MG capsule Take 1 capsule (60 mg total) by mouth 2  (two) times daily.   . fluticasone (FLONASE) 50 MCG/ACT nasal spray PLACE 2 SPRAYS INTO BOTH NOSTRILS DAILY AS NEEDED FOR ALLERGIES (Patient taking differently: Place 2 sprays into both nostrils at bedtime. )   . gabapentin (NEURONTIN) 300 MG capsule Take 300 mg by mouth 3 (three) times daily.   . Melatonin 10 MG TABS Take 5 mg by mouth at bedtime.    . methocarbamol (ROBAXIN) 500 MG tablet TAKE 1 TABLET (500 MG TOTAL) BY MOUTH 2 (TWO) TIMES DAILY AS NEEDED FOR MUSCLE SPASMS.-NOT COVERED 05/02/2020: Usually takes once daily; recently taking BID for her back  . metoprolol tartrate (LOPRESSOR) 50 MG tablet TAKE 1 TABLET BY MOUTH TWICE A DAY   . Multiple Vitamins-Minerals (BARIATRIC MULTIVITAMINS/IRON PO) Take 2 each by mouth daily.   . Multiple Vitamins-Minerals (ICAPS AREDS 2) CAPS Take 1 capsule by mouth 2 (two) times daily.   . nortriptyline (PAMELOR) 50 MG capsule Take 150 mg by mouth at bedtime.   . pantoprazole (PROTONIX) 40 MG tablet Take 1 tablet (40 mg total) by mouth daily.   . Probiotic Product (Accord) CAPS Take 1 capsule by mouth at bedtime.    Marland Kitchen Propylene Glycol (SYSTANE COMPLETE) 0.6 % SOLN Place 1 drop into both eyes daily at 12 noon.    . RESTASIS 0.05 % ophthalmic emulsion 1 drop 2 (two) times daily.   Marland Kitchen topiramate (TOPAMAX) 25 MG tablet TAKE 2 TABLETS (50 MG TOTAL) BY MOUTH AT BEDTIME   . colchicine 0.6 MG tablet TAKE 1 TABLET BY MOUTH DAILY AS NEEDED DURING GOUT FLARES. (Patient not taking: Reported on 05/02/2020)   . meclizine (ANTIVERT) 25 MG tablet Take 1 tablet (25 mg total) by mouth 3 (three) times daily as needed for dizziness. (Patient not taking: Reported on 05/02/2020)   . ondansetron (ZOFRAN) 8 MG tablet Take 8 mg by mouth every 8 (eight) hours as needed for nausea.  05/02/2020: Uses prn, very rarely   No facility-administered encounter medications on file as of 05/02/2020.   Allergies  Allergen Reactions  . Erythromycin Nausea Only    Abdominal pain  .  Tramadol Itching  . Dilaudid [Hydromorphone Hcl] Itching    ROS: The patient denies fever,vision changes, decreased hearing, breast concerns, palpitations, dizziness, syncope, cough, swelling, bowel changes (goes back and forth with diarrhea and constipation, per her usual; diarrhea isn't as often as it used to be), no abdominal pain,melena, hematochezia, indigestion/heartburn, hematuria, dysuria, vaginal bleeding, discharge, odor or itch, genital lesions, weakness, suspicious skin lesions, abnormal bleeding/bruising, or enlarged lymph nodes. dyspnea on exertion improved, has it mildly. Frequent temporal headaches. Fibromyalgia has been flaring (upper neck/shoulder pain). Moods are well controlled.  No gout flares. Chronic urinary leakage "forever" Passing more gas, per HPI   PHYSICAL EXAM:  BP 122/76 (BP Location: Left Arm, Patient Position: Sitting, Cuff Size: Normal)   Pulse 74   Temp 97.8 F (36.6 C)   Ht 5' 2.5" (1.588 m)   Wt 170  lb 9.6 oz (77.4 kg)   LMP 07/27/2006   BMI 30.71 kg/m   Wt Readings from Last 3 Encounters:  05/02/20 170 lb 9.6 oz (77.4 kg)  04/12/20 168 lb (76.2 kg)  03/29/20 172 lb 4.8 oz (78.2 kg)   Weight 230# at [facility] 03/2019.  General Appearance:  Alert, cooperative, no distress, appears stated age  Head:  Normocephalic, without obvious abnormality, atraumatic  Eyes:  PERRL, conjunctiva/corneas clear, EOM's intact, fundi  benign  Ears:  Normal TM's and external ear canals  Nose: Not examined, wearing mask due to COVID-19 pandemic  Throat: Not examined, wearing mask due to COVID-19 pandemic  Neck: Supple, no lymphadenopathy; thyroid: no enlargement/ tenderness/nodules; no carotid bruit or JVD  Back:  Spine nontender, no curvature, ROM normal, no CVA tenderness. Nontender over muscles, no trigger pointsnoted today  Lungs:  Clear to auscultation bilaterally without wheezes, rales or ronchi; respirations unlabored  Chest Wall:   No tenderness or deformity  Heart:  Regular rate and rhythm, S1 and S2 normal, no murmur, rub or gallop  Breast Exam:  Deferred to GYN  Abdomen:  Soft, obese, nondistended, normoactive bowel sounds, nontender, no masses, no hepatosplenomegaly.    Genitalia:  Deferred to GYN     Extremities: No clubbing, cyanosis or edema.   Pulses: 2+ and symmetric all extremities  Skin: Skin color, texture, turgor normal, no rashes or lesions  Lymph nodes: Cervical, supraclavicular, and axillary nodes normal  Neurologic: CNII-XII intact, normal strength, sensation and gait; reflexes 2+ and symmetric throughout. No significant tremor noted  Psych: Normal mood, affect, hygiene and grooming   ASSESSMENT/PLAN:  Annual physical exam - Plan: Magnesium, Lipid panel, Comprehensive metabolic panel, CBC with Differential/Platelet, VITAMIN D 25 Hydroxy (Vit-D Deficiency, Fractures), Hemoglobin A1c, Vitamin B12, Ferritin  Medicare annual wellness visit, subsequent  Mixed hyperlipidemia - controlled on atorvastatin - Plan: Lipid panel  Acute gout of right foot, unspecified cause - doing well on allopurinol without any flares  History of Roux-en-Y gastric bypass - has lost 60#, reassured regarding plateau, partly related to limited exercise after comp fx. Cont proper diet - Plan: Magnesium, Comprehensive metabolic panel, CBC with Differential/Platelet, VITAMIN D 25 Hydroxy (Vit-D Deficiency, Fractures), Vitamin B12, Ferritin  Fibromyalgia - stable, managed by rheum  OSA (obstructive sleep apnea) - Encouraged compliance with CPAP  Gastroesophageal reflux disease without esophagitis - controlled with qd PPI, overall improved with wt loss.  Medication monitoring encounter - Plan: Magnesium, Lipid panel, Comprehensive metabolic panel, CBC with Differential/Platelet, VITAMIN D 25 Hydroxy (Vit-D Deficiency, Fractures), Vitamin B12, Ferritin  Vitamin D deficiency  - pt with recent spinal fractures, and s/p bariatric surgery. Check D to ensure on adequate supplements, h/o low in the past - Plan: VITAMIN D 25 Hydroxy (Vit-D Deficiency, Fractures)  Impaired fasting glucose - counseled re: limiting sugar/carbs in diet, daily exercise and continued weight loss - Plan: Comprehensive metabolic panel, Hemoglobin A1c  History of spinal fracture - in 01/2020 after fall, no significant trauma, suspect fragility fx/osteoporosis, though last DEXA nl. Repeat DEXA and get MRI results, to determine if tx needed  CBC, ferritin, B12, D, Mg, c-met, lipid, A1c   Discussed monthly self breast exams and yearly mammograms; at least 30 minutes of aerobic activity at least 5 days/week, weight-bearing exercise at least 2x/wk; proper sunscreen use reviewed; healthy diet, including goals of calcium and vitamin D intake and alcohol recommendations (less than or equal to 1 drink/day) reviewed; regular seatbelt use; changing batteries in smoke detectors, carbon monoxide  detectors. Immunization recommendations discussed, UTD. Continue yearly high dose flu shots in the Fall.Colonoscopy is UTD per her GI.  Full Code, Full Care MOST form completed.  Send copy of note to GYN (Dr. Zelphia Cairo replacement). Needs DEXA repeated. Need to get MRI result from ortho to confirm if fragility fracture, in which case treatment for osteoporosis is recommended (vs a different type of fracture--she mentioned twisting component to her injury).   Medicare Attestation I have personally reviewed: The patient's medical and social history Their use of alcohol, tobacco or illicit drugs Their current medications and supplements The patient's functional ability including ADLs,fall risks, home safety risks, cognitive, and hearing and visual impairment Diet and physical activities Evidence for depression or mood disorders  The patient's weight, height, BMI have been recorded in the chart.  I have made  referrals, counseling, and provided education to the patient based on review of the above and I have provided the patient with a written personalized care plan for preventive services.

## 2020-05-02 ENCOUNTER — Ambulatory Visit (INDEPENDENT_AMBULATORY_CARE_PROVIDER_SITE_OTHER): Payer: Medicare Other | Admitting: Family Medicine

## 2020-05-02 ENCOUNTER — Encounter: Payer: Self-pay | Admitting: Family Medicine

## 2020-05-02 ENCOUNTER — Other Ambulatory Visit: Payer: Self-pay

## 2020-05-02 VITALS — BP 122/76 | HR 74 | Temp 97.8°F | Ht 62.5 in | Wt 170.6 lb

## 2020-05-02 DIAGNOSIS — E559 Vitamin D deficiency, unspecified: Secondary | ICD-10-CM

## 2020-05-02 DIAGNOSIS — Z0000 Encounter for general adult medical examination without abnormal findings: Secondary | ICD-10-CM | POA: Diagnosis not present

## 2020-05-02 DIAGNOSIS — Z5181 Encounter for therapeutic drug level monitoring: Secondary | ICD-10-CM

## 2020-05-02 DIAGNOSIS — M109 Gout, unspecified: Secondary | ICD-10-CM

## 2020-05-02 DIAGNOSIS — R7301 Impaired fasting glucose: Secondary | ICD-10-CM | POA: Diagnosis not present

## 2020-05-02 DIAGNOSIS — G4733 Obstructive sleep apnea (adult) (pediatric): Secondary | ICD-10-CM

## 2020-05-02 DIAGNOSIS — M797 Fibromyalgia: Secondary | ICD-10-CM

## 2020-05-02 DIAGNOSIS — E782 Mixed hyperlipidemia: Secondary | ICD-10-CM | POA: Diagnosis not present

## 2020-05-02 DIAGNOSIS — E785 Hyperlipidemia, unspecified: Secondary | ICD-10-CM

## 2020-05-02 DIAGNOSIS — Z9884 Bariatric surgery status: Secondary | ICD-10-CM

## 2020-05-02 DIAGNOSIS — Z8781 Personal history of (healed) traumatic fracture: Secondary | ICD-10-CM

## 2020-05-02 DIAGNOSIS — K219 Gastro-esophageal reflux disease without esophagitis: Secondary | ICD-10-CM

## 2020-05-03 LAB — COMPREHENSIVE METABOLIC PANEL
ALT: 46 IU/L — ABNORMAL HIGH (ref 0–32)
AST: 34 IU/L (ref 0–40)
Albumin/Globulin Ratio: 1.7 (ref 1.2–2.2)
Albumin: 4 g/dL (ref 3.8–4.8)
Alkaline Phosphatase: 138 IU/L — ABNORMAL HIGH (ref 48–121)
BUN/Creatinine Ratio: 27 (ref 12–28)
BUN: 24 mg/dL (ref 8–27)
Bilirubin Total: <0.2 mg/dL (ref 0.0–1.2)
CO2: 24 mmol/L (ref 20–29)
Calcium: 9.1 mg/dL (ref 8.7–10.3)
Chloride: 105 mmol/L (ref 96–106)
Creatinine, Ser: 0.9 mg/dL (ref 0.57–1.00)
GFR calc Af Amer: 79 mL/min/{1.73_m2} (ref >59)
GFR calc non Af Amer: 68 mL/min/{1.73_m2} (ref >59)
Globulin, Total: 2.3 g/dL (ref 1.5–4.5)
Glucose: 85 mg/dL (ref 65–99)
Potassium: 4.3 mmol/L (ref 3.5–5.2)
Sodium: 143 mmol/L (ref 134–144)
Total Protein: 6.3 g/dL (ref 6.0–8.5)

## 2020-05-03 LAB — VITAMIN D 25 HYDROXY (VIT D DEFICIENCY, FRACTURES): Vit D, 25-Hydroxy: 58.6 ng/mL (ref 30.0–100.0)

## 2020-05-03 LAB — CBC WITH DIFFERENTIAL/PLATELET
Basophils Absolute: 0.1 10*3/uL (ref 0.0–0.2)
Basos: 1 %
EOS (ABSOLUTE): 0.2 10*3/uL (ref 0.0–0.4)
Eos: 2 %
Hematocrit: 42.2 % (ref 34.0–46.6)
Hemoglobin: 13.6 g/dL (ref 11.1–15.9)
Immature Grans (Abs): 0.1 10*3/uL (ref 0.0–0.1)
Immature Granulocytes: 1 %
Lymphocytes Absolute: 3.1 10*3/uL (ref 0.7–3.1)
Lymphs: 29 %
MCH: 31.3 pg (ref 26.6–33.0)
MCHC: 32.2 g/dL (ref 31.5–35.7)
MCV: 97 fL (ref 79–97)
Monocytes Absolute: 0.8 10*3/uL (ref 0.1–0.9)
Monocytes: 8 %
Neutrophils Absolute: 6.5 10*3/uL (ref 1.4–7.0)
Neutrophils: 59 %
Platelets: 341 10*3/uL (ref 150–450)
RBC: 4.34 x10E6/uL (ref 3.77–5.28)
RDW: 12.3 % (ref 11.7–15.4)
WBC: 10.7 10*3/uL (ref 3.4–10.8)

## 2020-05-03 LAB — HEMOGLOBIN A1C
Est. average glucose Bld gHb Est-mCnc: 117 mg/dL
Hgb A1c MFr Bld: 5.7 % — ABNORMAL HIGH (ref 4.8–5.6)

## 2020-05-03 LAB — LIPID PANEL
Chol/HDL Ratio: 2.8 ratio (ref 0.0–4.4)
Cholesterol, Total: 116 mg/dL (ref 100–199)
HDL: 41 mg/dL (ref >39)
LDL Chol Calc (NIH): 50 mg/dL (ref 0–99)
Triglycerides: 147 mg/dL (ref 0–149)
VLDL Cholesterol Cal: 25 mg/dL (ref 5–40)

## 2020-05-03 LAB — VITAMIN B12: Vitamin B-12: 1868 pg/mL — ABNORMAL HIGH (ref 232–1245)

## 2020-05-03 LAB — FERRITIN: Ferritin: 43 ng/mL (ref 15–150)

## 2020-05-03 LAB — MAGNESIUM: Magnesium: 2.2 mg/dL (ref 1.6–2.3)

## 2020-05-03 MED ORDER — ATORVASTATIN CALCIUM 40 MG PO TABS: 40.0000 mg | ORAL_TABLET | Freq: Every day | ORAL | 3 refills | Status: DC

## 2020-05-04 DIAGNOSIS — M545 Low back pain: Secondary | ICD-10-CM | POA: Diagnosis not present

## 2020-05-06 ENCOUNTER — Other Ambulatory Visit: Payer: Self-pay | Admitting: Rheumatology

## 2020-05-06 NOTE — Telephone Encounter (Signed)
Last Visit: 01/05/2020 Next Visit: 07/04/2020  Okay to refill per Dr. Estanislado Pandy.

## 2020-05-09 ENCOUNTER — Other Ambulatory Visit: Payer: Self-pay

## 2020-05-09 ENCOUNTER — Encounter: Payer: Self-pay | Admitting: Adult Health

## 2020-05-09 ENCOUNTER — Encounter: Payer: Self-pay | Admitting: Family Medicine

## 2020-05-09 ENCOUNTER — Ambulatory Visit (INDEPENDENT_AMBULATORY_CARE_PROVIDER_SITE_OTHER): Payer: Medicare HMO | Admitting: Adult Health

## 2020-05-09 DIAGNOSIS — G4700 Insomnia, unspecified: Secondary | ICD-10-CM | POA: Diagnosis not present

## 2020-05-09 DIAGNOSIS — F411 Generalized anxiety disorder: Secondary | ICD-10-CM | POA: Diagnosis not present

## 2020-05-09 DIAGNOSIS — F331 Major depressive disorder, recurrent, moderate: Secondary | ICD-10-CM

## 2020-05-09 MED ORDER — ARIPIPRAZOLE 5 MG PO TABS: 5.0000 mg | ORAL_TABLET | Freq: Every day | ORAL | 1 refills | Status: DC

## 2020-05-09 MED ORDER — DULOXETINE HCL 60 MG PO CPEP: 60.0000 mg | ORAL_CAPSULE | Freq: Two times a day (BID) | ORAL | 1 refills | Status: DC

## 2020-05-09 MED ORDER — ALPRAZOLAM 0.25 MG PO TABS: 0.2500 mg | ORAL_TABLET | Freq: Three times a day (TID) | ORAL | 2 refills | Status: DC | PRN

## 2020-05-09 NOTE — Progress Notes (Signed)
[name] [id number] 08/12/1956 64 y.o.  Subjective:   Patient ID:  [name] is a 63 y.o. (DOB 09/18/1956) female.  Chief Complaint: No chief complaint on file.   HPI [name] presents to the office today for follow-up of  anxiety, depression, and insomnia.  Describes mood today as "ok". Pleasant. Mood symptoms - reports depression, anxiety, and irritability. Feels "stressed" at times. Husband had a heart cath recently and is following up with cardiology. Bariatric surgery early November. 2 oral surgeries since last visit.Fell and cracked 2 vertebrae. Working with P/T weekly. Mother weak and not feeling well - age 88. Stable interest and motivation. Taking medications as prescribed.  Energy levels stable. Active, does not have a regular exercise routine with back injury.  Enjoys some usual interests and activities. Married. Lives with husband of 63 years. Has 2 sons - one local and another one in [place]. Mother local. Spending time with family. Appetite adequate. Weight loss - 56 pound since bariatric surgery.  Sleeps better some nights than others. Averages 5 to 7 hours. Focus and concentration stable. Completing tasks. Managing aspects of household.  Denies SI or HI. Denies AH or VH.   PHQ2-9     Office Visit from 05/02/2020 in [facility] Visit from 04/15/2019 in [facility] Nutrition from 05/06/2018 in [facility] Office Visit from 04/09/2018 in [facility]  PHQ-2 Total Score 0 0 0 0 0       Review of Systems:  Review of Systems  Musculoskeletal: Negative for gait problem.  Neurological: Negative for tremors.  Psychiatric/Behavioral:       Please refer to HPI    Medications: I have reviewed the patient's current medications.  Current Outpatient Medications  Medication Sig Dispense Refill  . acetaminophen (TYLENOL) 500  MG tablet Take 1,000 mg by mouth 3 (three) times daily as needed for moderate pain.    Marland Kitchen allopurinol (ZYLOPRIM) 300 MG tablet TAKE 1 TABLET BY MOUTH EVERY DAY 90 tablet 0  . ALPRAZolam (XANAX) 0.25 MG tablet Take 1 tablet (0.25 mg total) by mouth 3 (three) times daily as needed for anxiety. 90 tablet 2  . ARIPiprazole (ABILIFY) 5 MG tablet Take 1 tablet (5 mg total) by mouth daily. 90 tablet 1  . Armodafinil 250 MG tablet TAKE 1 TABLET EVERY DAY WITH BREAKFAST 30 tablet 2  . atorvastatin (LIPITOR) 40 MG tablet Take 1 tablet (40 mg total) by mouth daily. 90 tablet 3  . CALCIUM PO Take 500 mg by mouth in the morning, at noon, and at bedtime.    . cetirizine (ZYRTEC) 10 MG tablet Take 10 mg by mouth at bedtime.    . colchicine 0.6 MG tablet TAKE 1 TABLET BY MOUTH DAILY AS NEEDED DURING GOUT FLARES. (Patient not taking: Reported on 05/02/2020) 30 tablet 0  . DULoxetine (CYMBALTA) 60 MG capsule Take 1 capsule (60 mg total) by mouth 2 (two) times daily. 180 capsule 1  . fluticasone (FLONASE) 50 MCG/ACT nasal spray PLACE 2 SPRAYS INTO BOTH NOSTRILS DAILY AS NEEDED FOR ALLERGIES (Patient taking differently: Place 2 sprays into both nostrils at bedtime. ) 16 g 1  . gabapentin (NEURONTIN) 300 MG capsule Take 300 mg by mouth 3 (three) times daily.    . meclizine (ANTIVERT) 25 MG tablet Take 1 tablet (25 mg total) by mouth 3 (three) times daily as needed for dizziness. (Patient not taking:  Reported on 05/02/2020) 30 tablet 0  . Melatonin 10 MG TABS Take 5 mg by mouth at bedtime.     . methocarbamol (ROBAXIN) 500 MG tablet TAKE 1 TABLET (500 MG TOTAL) BY MOUTH 2 (TWO) TIMES DAILY AS NEEDED FOR MUSCLE SPASMS.-NOT COVERED 60 tablet 0  . metoprolol tartrate (LOPRESSOR) 50 MG tablet TAKE 1 TABLET BY MOUTH TWICE A DAY 180 tablet 0  . Multiple Vitamins-Minerals (BARIATRIC MULTIVITAMINS/IRON PO) Take 2 each by mouth daily.    . Multiple Vitamins-Minerals (ICAPS AREDS 2) CAPS Take 1 capsule by mouth 2 (two) times daily.     . nortriptyline (PAMELOR) 50 MG capsule Take 150 mg by mouth at bedtime.    . ondansetron (ZOFRAN) 8 MG tablet Take 8 mg by mouth every 8 (eight) hours as needed for nausea.   0  . pantoprazole (PROTONIX) 40 MG tablet Take 1 tablet (40 mg total) by mouth daily. 90 tablet 0  . Probiotic Product ([place]) CAPS Take 1 capsule by mouth at bedtime.     Marland Kitchen Propylene Glycol (SYSTANE COMPLETE) 0.6 % SOLN Place 1 drop into both eyes daily at 12 noon.     . RESTASIS 0.05 % ophthalmic emulsion 1 drop 2 (two) times daily.    Marland Kitchen topiramate (TOPAMAX) 25 MG tablet TAKE 2 TABLETS (50 MG TOTAL) BY MOUTH AT BEDTIME 180 tablet 0   No current facility-administered medications for this visit.    Medication Side Effects: None  Allergies:  Allergies  Allergen Reactions  . Erythromycin Nausea Only    Abdominal pain  . Tramadol Itching  . Dilaudid [Hydromorphone Hcl] Itching    Past Medical History:  Diagnosis Date  . Anemia    previously followed by Dr. Jamse Arn for anemia and elevated platelets  . Anxiety   . C. difficile colitis 10/01/12   treated by [facility]  . Chronic fatigue syndrome   . DDD (degenerative disc disease), lumbar 08/19/14   and facet arthroplasty & left lumbar radiculopathy (Dr.Ramos)  . Depression   . Dyssynergia    dyssynergenic defecation, contributing to fecal incontinence.  . Edema   . Fibromyalgia   . Gastroparesis    followed at [facility]  . GERD (gastroesophageal reflux disease)   . History of kidney stones   . Hyperlipidemia   . Kidney stone   . Lumbar radiculopathy   . Migraine   . Neuropathy   . Obstructive sleep apnea    Does  wear  CPAP  . Paresthesia    Dr. Everette Rank at [facility]  . Pelvic floor dysfunction    pelvic floor dyssynergy  . Plantar fasciitis 02/2011   R foot  . Pneumonia    2012  . PONV (postoperative nausea and vomiting)    pt states has gastroparesis has difficulty taking antibiotics and narcotics has severe nausea and  vomiting   . Restless leg syndrome   . S/P endometrial ablation 08/09/2006   Novasure Ablation  . S/P epidural steroid injection 09/20/14   Dr.Ramos  . Tremor    Dr. Everette Rank  . Urinary frequency   . Urinary incontinence     Family History  Problem Relation Age of Onset  . Allergies Mother   . Hypertension Mother   . Heart disease Mother        possible valve problem - leaking valve  . Macular degeneration Mother   . Heart disease Father        pacemaker, CHF  . Hypertension Father   .  Diabetes Father        borderline  . Stroke Father 27  . Kidney disease Father   . Asthma Sister   . Irritable bowel syndrome Sister   . Allergies Sister   . Heart disease Paternal Grandmother   . Heart disease Paternal Grandfather   . Cancer Maternal Aunt        leukemia  . Cancer Maternal Aunt   . Colon cancer Maternal Aunt        late 84's  . Heart disease Maternal Grandmother   . Heart disease Maternal Grandfather   . CAD Neg Hx     Social History   Socioeconomic History  . Marital status: Married    Spouse name: Not on file  . Number of children: 2  . Years of education: Not on file  . Highest education level: Not on file  Occupational History  . Occupation: Therapist, art (on disability)    Employer: [employer]  Tobacco Use  . Smoking status: Never Smoker  . Smokeless tobacco: Never Used  Vaping Use  . Vaping Use: Never used  Substance and Sexual Activity  . Alcohol use: No    Alcohol/week: 0.0 standard drinks  . Drug use: No  . Sexual activity: Not Currently    Birth control/protection: Post-menopausal    Comment: 1st intercourse 64 yo-Fewer than 5 partners  Other Topics Concern  . Not on file  Social History Narrative   Married, 1 dog. 1 son in [place] (grandson born 06/2017), 1 stepson in [place], with 2 children   Social Determinants of Health   Financial Resource Strain:   . Difficulty of Paying Living Expenses:   Food Insecurity:   . Worried About  Charity fundraiser in the Last Year:   . Arboriculturist in the Last Year:   Transportation Needs:   . Film/video editor (Medical):   Marland Kitchen Lack of Transportation (Non-Medical):   Physical Activity:   . Days of Exercise per Week:   . Minutes of Exercise per Session:   Stress:   . Feeling of Stress :   Social Connections:   . Frequency of Communication with Friends and Family:   . Frequency of Social Gatherings with Friends and Family:   . Attends Religious Services:   . Active Member of Clubs or Organizations:   . Attends Archivist Meetings:   Marland Kitchen Marital Status:   Intimate Partner Violence:   . Fear of Current or Ex-Partner:   . Emotionally Abused:   Marland Kitchen Physically Abused:   . Sexually Abused:     Past Medical History, Surgical history, Social history, and Family history were reviewed and updated as appropriate.   Please see review of systems for further details on the patient's review from today.   Objective:   Physical Exam:  LMP 07/27/2006   Physical Exam Constitutional:      General: She is not in acute distress. Musculoskeletal:        General: No deformity.  Neurological:     Mental Status: She is alert and oriented to person, place, and time.     Coordination: Coordination normal.  Psychiatric:        Attention and Perception: Attention and perception normal. She does not perceive auditory or visual hallucinations.        Mood and Affect: Mood normal. Mood is not anxious or depressed. Affect is not labile, blunt, angry or inappropriate.  Speech: Speech normal.        Behavior: Behavior normal.        Thought Content: Thought content normal. Thought content is not paranoid or delusional. Thought content does not include homicidal or suicidal ideation. Thought content does not include homicidal or suicidal plan.        Cognition and Memory: Cognition and memory normal.        Judgment: Judgment normal.     Comments: Insight intact     Lab  Review:     Component Value Date/Time   NA 143 05/02/2020 1116   K 4.3 05/02/2020 1116   CL 105 05/02/2020 1116   CO2 24 05/02/2020 1116   GLUCOSE 85 05/02/2020 1116   GLUCOSE 96 09/29/2019 0645   BUN 24 05/02/2020 1116   CREATININE 0.90 05/02/2020 1116   CREATININE 0.74 10/03/2017 1020   CALCIUM 9.1 05/02/2020 1116   PROT 6.3 05/02/2020 1116   ALBUMIN 4.0 05/02/2020 1116   AST 34 05/02/2020 1116   ALT 46 (H) 05/02/2020 1116   ALKPHOS 138 (H) 05/02/2020 1116   BILITOT <0.2 05/02/2020 1116   GFRNONAA 68 05/02/2020 1116   GFRAA 79 05/02/2020 1116       Component Value Date/Time   WBC 10.7 05/02/2020 1116   WBC 10.2 10/01/2019 0328   RBC 4.34 05/02/2020 1116   RBC 3.76 (L) 10/01/2019 0328   HGB 13.6 05/02/2020 1116   HGB 11.7 05/02/2011 1412   HCT 42.2 05/02/2020 1116   HCT 37.1 05/02/2011 1412   PLT 341 05/02/2020 1116   MCV 97 05/02/2020 1116   MCV 94.4 05/02/2011 1412   MCH 31.3 05/02/2020 1116   MCH 29.8 10/01/2019 0328   MCHC 32.2 05/02/2020 1116   MCHC 30.6 10/01/2019 0328   RDW 12.3 05/02/2020 1116   RDW 18.3 (H) 05/02/2011 1412   LYMPHSABS 3.1 05/02/2020 1116   LYMPHSABS 2.4 05/02/2011 1412   MONOABS 1.2 (H) 10/01/2019 0328   MONOABS 0.8 05/02/2011 1412   EOSABS 0.2 05/02/2020 1116   BASOSABS 0.1 05/02/2020 1116   BASOSABS 0.1 05/02/2011 1412    No results found for: POCLITH, LITHIUM   No results found for: PHENYTOIN, PHENOBARB, VALPROATE, CBMZ   .res Assessment: Plan:    Plan:  1. Xanax 0.25mg  TID 2. Cymbalta 60mg  BID 3. Abilify 5mg  daily  Melatonin PRN  RTC 6 months  Patient advised to contact office with any questions, adverse effects, or acute worsening in signs and symptoms.  Discussed potential metabolic side effects associated with atypical antipsychotics, as well as potential risk for movement side effects. Advised pt to contact office if movement side effects occur.   Discussed potential metabolic side effects associated with  atypical antipsychotics, as well as potential risk for movement side effects. Advised pt to contact office if movement side effects occur.   Diagnoses and all orders for this visit:  Major depressive disorder, recurrent episode, moderate (HCC) -     DULoxetine (CYMBALTA) 60 MG capsule; Take 1 capsule (60 mg total) by mouth 2 (two) times daily. -     ARIPiprazole (ABILIFY) 5 MG tablet; Take 1 tablet (5 mg total) by mouth daily.  Generalized anxiety disorder -     DULoxetine (CYMBALTA) 60 MG capsule; Take 1 capsule (60 mg total) by mouth 2 (two) times daily. -     ARIPiprazole (ABILIFY) 5 MG tablet; Take 1 tablet (5 mg total) by mouth daily.  Insomnia, unspecified type -     ALPRAZolam (  XANAX) 0.25 MG tablet; Take 1 tablet (0.25 mg total) by mouth 3 (three) times daily as needed for anxiety.     Please see After Visit Summary for patient specific instructions.  Future Appointments  Date Time Provider [place]  05/31/2020 10:00 AM Ruby Cola, RD [place] [facility]  07/04/2020 10:30 AM Bo Merino, MD [facility] None  11/07/2020  9:30 AM Rita Ohara, MD [facility]    No orders of the defined types were placed in this encounter.   -------------------------------

## 2020-05-10 ENCOUNTER — Ambulatory Visit: Payer: Medicare Other | Admitting: Podiatry

## 2020-05-10 DIAGNOSIS — M47817 Spondylosis without myelopathy or radiculopathy, lumbosacral region: Secondary | ICD-10-CM | POA: Diagnosis not present

## 2020-05-10 DIAGNOSIS — M545 Low back pain: Secondary | ICD-10-CM | POA: Diagnosis not present

## 2020-05-19 DIAGNOSIS — M5136 Other intervertebral disc degeneration, lumbar region: Secondary | ICD-10-CM | POA: Diagnosis not present

## 2020-05-19 DIAGNOSIS — M797 Fibromyalgia: Secondary | ICD-10-CM | POA: Diagnosis not present

## 2020-05-19 DIAGNOSIS — M545 Low back pain: Secondary | ICD-10-CM | POA: Diagnosis not present

## 2020-05-31 ENCOUNTER — Encounter: Payer: Medicare Other | Attending: Surgery | Admitting: Skilled Nursing Facility1

## 2020-05-31 ENCOUNTER — Other Ambulatory Visit: Payer: Self-pay

## 2020-05-31 DIAGNOSIS — Z6830 Body mass index (BMI) 30.0-30.9, adult: Secondary | ICD-10-CM | POA: Insufficient documentation

## 2020-05-31 DIAGNOSIS — Z713 Dietary counseling and surveillance: Secondary | ICD-10-CM | POA: Insufficient documentation

## 2020-05-31 DIAGNOSIS — E669 Obesity, unspecified: Secondary | ICD-10-CM | POA: Diagnosis not present

## 2020-05-31 NOTE — Progress Notes (Signed)
Bariatric Nutrition Follow-Up Visit Medical Nutrition Therapy    Post-Operative RYGB Surgery Surgery Date: 10/13/2019 NUTRITION ASSESSMENT    Anthropometrics  Start weight at [facility]: 225.8 lbs (date: 05/06/2018) Today's weight: 168.3  Body Composition Scale 12/28/2019 03/29/2020 05/31/2020  Total Body Fat % 41.3 39 38.3  Visceral Fat 13 11 11   Fat-Free Mass % 58.6 60.9 61.6   Total Body Water % 43.8 44.9 45.3   Muscle-Mass lbs 27.5 27.2 27.1  Body Fat Displacement            Torso  lbs 48 41.5 39.7         Left Leg  lbs 9.6 8.3 7.9         Right Leg  lbs 9.6 8.3 7.9         Left Arm  lbs 4.8 4.1 3.9         Right Arm   lbs 4.8 4.1 3.9    Clinical  Medical hx: migraines, OSA, GERD, kidney stones, fibromyalgia Medications: topamax, protonix, lopressor, gabapentin, hydrocodone, xanax, abilify Labs: see list   Lifestyle & Dietary Hx  Pt arrives stating she fell and is now wearing a back brace due to 2 cracked vertebrate. Pt states with this stress she finds herself maybe emotional eating. to statess she has not been sleeping very well. Pt states she has bible study and crafting. Pt states her fibromyalgia is flaring too. Pt states she has medicine for her chronic fatigue. Pt states she is not exeperinecing as much reflux anymore.    Pt states her husband is ill which she is scared about causing more stress and tiredness and pt states her children are going through something as well. Pt states she got a fitbit to help remind her to be active.  Pt states she has been emotional eating.   Estimated daily fluid intake: 50+ oz Estimated daily protein intake: 60+ g Supplements: bar advantage, calcium  Current average weekly physical activity: will start BELT again    24-Hr Dietary Recall daily protein shake First Meal: cereal + strawberries Snack: watermelon Second Meal: premier protein   Snack: cucumber Third Meal: salad + corn + steak Snack: nuts Beverages: alkaline water 9+;  46-50 ounces; with more occurences of 64+ than before; gatorade zero  Post-Op Goals/ Signs/ Symptoms Using straws: no Drinking while eating: no Chewing/swallowing difficulties: no Changes in vision: no Changes to mood/headaches: no Hair loss/changes to skin/nails: no Difficulty focusing/concentrating: no Sweating: no Dizziness/lightheadedness: no Palpitations: no  Carbonated/caffeinated beverages: no N/V/D/C/Gas: no Abdominal pain: no Dumping syndrome: no    NUTRITION DIAGNOSIS  Overweight/obesity ([name]-3.3) related to past poor dietary habits and physical inactivity as evidenced by completed bariatric surgery and following dietary guidelines for continued weight loss and healthy nutrition status.     NUTRITION INTERVENTION Nutrition counseling (C-1) and education (E-2) to facilitate bariatric surgery goals, including: . The importance of consuming adequate calories as well as certain nutrients daily due to the body's need for essential vitamins, minerals, and fats . The importance of daily physical activity and to reach a goal of at least 150 minutes of moderate to vigorous physical activity weekly (or as directed by their physician) due to benefits such as increased musculature and improved lab values  Handouts Provided Include   Should it eat sheet  Meals check list  Goals:  -write everything that goes into your mouth food and drink -Keep aiming for 64+ ounces of fluid -Keep up with the basics of a healthy diet -  Avoid eating under every 3 hours (grazing)  -Stop eating at satisfaction not fullness   Learning Style & Readiness for Change Teaching method utilized: Visual & Auditory  Demonstrated degree of understanding via: Teach Back  Barriers to learning/adherence to lifestyle change: none identified   RD's Notes for Next Visit . Assess adherence to pt chosen goals   MONITORING & EVALUATION Dietary intake, weekly physical activity, body weight:

## 2020-06-03 ENCOUNTER — Telehealth: Payer: Self-pay | Admitting: *Deleted

## 2020-06-03 NOTE — Telephone Encounter (Signed)
Submitted a Prior Authorization request to [facility] for Armodafinil via Cover My Meds. Will update once we receive a response.

## 2020-06-06 ENCOUNTER — Telehealth: Payer: Self-pay | Admitting: *Deleted

## 2020-06-06 ENCOUNTER — Other Ambulatory Visit: Payer: Self-pay | Admitting: Family Medicine

## 2020-06-06 DIAGNOSIS — Z9181 History of falling: Secondary | ICD-10-CM

## 2020-06-06 NOTE — Telephone Encounter (Signed)
Patient scheduled bone density for August 17.

## 2020-06-06 NOTE — Telephone Encounter (Signed)
Patient called stating her PCP and orthopedic doctor recommended patient have a dexa done. Per note repeat at age 64. However patient has a fall and cracked 2 vertebra and orthopedic thought a dexa would be best due to fall. She has Dexa's done here, okay to schedule?

## 2020-06-06 NOTE — Telephone Encounter (Signed)
Order placed, will route to appointments to schedule.

## 2020-06-06 NOTE — Telephone Encounter (Signed)
yes

## 2020-06-07 ENCOUNTER — Other Ambulatory Visit: Payer: Self-pay | Admitting: Rheumatology

## 2020-06-07 NOTE — Telephone Encounter (Signed)
Last Visit:01/05/2020 Next Visit:07/04/2020  Last Fill: 03/18/2020  Okay to refillArmodafinil?

## 2020-06-09 ENCOUNTER — Other Ambulatory Visit: Payer: Self-pay | Admitting: Family Medicine

## 2020-06-13 ENCOUNTER — Telehealth: Payer: Self-pay | Admitting: Rheumatology

## 2020-06-13 NOTE — Telephone Encounter (Signed)
Patient states she tried to pick up her Armodafinil prescription from the pharmacy and they would not fill the prescription. Patient states she was advised it was due to the note on the prescription sent in stating not to fill prior to 06/17/2020. Patient states she will take her last tablet tomorrow. A prescription was sent in on 03/18/2020 for 30 tablets with 2 additional refills. Patient states she is not sure why she does not have enough to last until 06/17/2020 as she only take one a day as prescribed. Please advise.

## 2020-06-13 NOTE — Telephone Encounter (Signed)
Contacted the pharmacy and advised [name] that it is okay to refill the Armodafinil. Contacted patient and advised.

## 2020-06-13 NOTE — Telephone Encounter (Signed)
Patient called requesting prescription refill of Armodafinil to be sent to [facility].  Patient states the pharmacist told her they were not able to refill her prescription until 06/17/20, but she takes her last pill tomorrow.  Please advise.

## 2020-06-13 NOTE — Telephone Encounter (Signed)
Please call the pharmacy to have them dispense her refill today.

## 2020-06-20 NOTE — Progress Notes (Signed)
Office Visit Note  Patient: [name]             Date of Birth: 1956-08-30           MRN: [id number]             PCP: Rita Ohara, MD Referring: Rita Ohara, MD Visit Date: 07/04/2020 Occupation: @GUAROCC @  Subjective:  Trapezius muscle tenderness   History of Present Illness: [name] is a 64 y.o. female with history of gout, fibromyalgia, and osteoarthritis. She is taking allopurinol 300 mg daily and colchicine 0.6 mg as needed during gout flares.  She denies having a gout flare.  [name] states that in March she fell and injured her back.  She had vertebral fractures  .  She was under care of Dr. Maxie Better.  She states she did not require vertebroplasty or kyphoplasty.  Her fibromyalgia symptoms are flaring.  Her bilateral total knee replacements are doing well.  She continues to have discomfort from underlying disc disease.  She has DEXA scan is scheduled by her GYN.  Activities of Daily Living:  Patient reports morning stiffness for 0 minutes.   Patient Reports nocturnal pain.  Difficulty dressing/grooming: Denies Difficulty climbing stairs: Denies Difficulty getting out of chair: Denies Difficulty using hands for taps, buttons, cutlery, and/or writing: Denies  Review of Systems  Constitutional: Positive for fatigue.  HENT: Positive for mouth sores, mouth dryness and nose dryness.   Eyes: Positive for dryness. Negative for itching.  Respiratory: Negative for shortness of breath and difficulty breathing.   Cardiovascular: Negative for chest pain and palpitations.  Gastrointestinal: Positive for constipation. Negative for blood in stool and diarrhea.  Endocrine: Negative for increased urination.  Genitourinary: Negative for difficulty urinating.  Musculoskeletal: Positive for myalgias, muscle tenderness and myalgias. Negative for arthralgias, joint pain, joint swelling and morning stiffness.  Skin: Negative for color change, rash and redness.  Allergic/Immunologic: Negative for  susceptible to infections.  Neurological: Positive for headaches. Negative for dizziness, numbness, memory loss and weakness.  Hematological: Negative for bruising/bleeding tendency.  Psychiatric/Behavioral: Negative for confusion.    PMFS History:  Patient Active Problem List   Diagnosis Date Noted  . Gastric bypass status for obesity 09/29/2019  . Aftercare 08/12/2018  . Pain in left knee 06/03/2018  . Dyspnea 12/26/2017  . Restrictive lung disease secondary to obesity 12/26/2017  . Atypical chest pain 12/06/2017  . Sinus tachycardia 12/06/2017  . Chest pain 12/06/2017  . DJD (degenerative joint disease), cervical 12/24/2016  . Primary osteoarthritis of both hips 12/24/2016  . Primary osteoarthritis of both knees 12/24/2016  . H/O total knee replacement, right 12/24/2016  . Spondylosis of lumbar region without myelopathy or radiculopathy 12/24/2016  . Lumbosacral spondylosis without myelopathy 12/24/2016  . Acute gout 05/30/2016  . Gout 05/30/2016  . Myalgia 03/29/2016  . Other long term (current) drug therapy 03/29/2016  . Idiopathic peripheral neuropathy 03/29/2016  . Cannot sleep 03/29/2016  . Migraine without aura and responsive to treatment 03/29/2016  . Multifocal myoclonus 03/29/2016  . Restless leg 03/29/2016  . Has a tremor 03/29/2016  . History of aspiration pneumonitis 01/25/2016  . History of acute bronchitis 01/25/2016  . LPRD (laryngopharyngeal reflux disease) 01/25/2016  . Imbalance 01/09/2016  . Serotonin syndrome 12/22/2015  . Hypertensive disorder 09/10/2015  . OA (osteoarthritis) of knee 09/05/2015  . Obesity 05/17/2015  . OSA (obstructive sleep apnea) 03/16/2013  . Iron deficiency 11/06/2012  . Thrombocythemia ([place]) 11/06/2012  . Leukocytosis 11/05/2012  .  Impaired fasting glucose 07/23/2012  . Bronchitis 04/03/2012  . Kidney stone on left side 03/05/2012  . Kidney cysts 03/04/2012  . Loose stools 03/04/2012  . Urinary frequency 12/04/2011  .  Restless leg syndrome 12/04/2011  . Polypharmacy 12/04/2011  . Bladder incontinence 12/04/2011  . Fibromyalgia   . Hyperlipidemia   . Edema   . S/P endometrial ablation   . Allergic rhinitis 09/18/2011  . Depression, major, single episode, in partial remission ([place]) 04/21/2011  . PRECORDIAL PAIN 02/17/2010  . Anxiety state 01/30/2010  . Migraine 01/30/2010  . GERD 01/30/2010  . Gastroparesis 01/30/2010    Past Medical History:  Diagnosis Date  . Anemia    previously followed by Dr. Jamse Arn for anemia and elevated platelets  . Anxiety   . C. difficile colitis 10/01/12   treated by [facility]  . Chronic fatigue syndrome   . DDD (degenerative disc disease), lumbar 08/19/14   and facet arthroplasty & left lumbar radiculopathy (Dr.Ramos)  . Depression   . Dyssynergia    dyssynergenic defecation, contributing to fecal incontinence.  . Edema   . Fibromyalgia   . Gastroparesis    followed at [facility]  . GERD (gastroesophageal reflux disease)   . History of kidney stones   . Hyperlipidemia   . Kidney stone   . Lumbar radiculopathy   . Migraine   . Neuropathy   . Obstructive sleep apnea    Does  wear  CPAP  . Paresthesia    Dr. Everette Rank at [facility]  . Pelvic floor dysfunction    pelvic floor dyssynergy  . Plantar fasciitis 02/2011   R foot  . Pneumonia    2012  . PONV (postoperative nausea and vomiting)    pt states has gastroparesis has difficulty taking antibiotics and narcotics has severe nausea and vomiting   . Restless leg syndrome   . S/P endometrial ablation 08/09/2006   Novasure Ablation  . S/P epidural steroid injection 09/20/14   Dr.Ramos  . Tremor    Dr. Everette Rank  . Urinary frequency   . Urinary incontinence     Family History  Problem Relation Age of Onset  . Allergies Mother   . Hypertension Mother   . Heart disease Mother        possible valve problem - leaking valve  . Macular degeneration Mother   . Heart disease Father        pacemaker, CHF  .  Hypertension Father   . Diabetes Father        borderline  . Stroke Father 72  . Kidney disease Father   . Asthma Sister   . Irritable bowel syndrome Sister   . Allergies Sister   . Heart disease Paternal Grandmother   . Heart disease Paternal Grandfather   . Cancer Maternal Aunt        leukemia  . Cancer Maternal Aunt   . Colon cancer Maternal Aunt        late 41's  . Heart disease Maternal Grandmother   . Heart disease Maternal Grandfather   . CAD Neg Hx    Past Surgical History:  Procedure Laterality Date  . CHOLECYSTECTOMY  9/05  . ENDOMETRIAL ABLATION  08/09/2006   Dr. Valentina Shaggy Ablation  . FACET JOINT INJECTION  04/17/2017   Left L4-5 and L5-S1  . GASTRIC ROUX-EN-Y N/A 09/29/2019   Procedure: LAPAROSCOPIC ROUX-EN-Y GASTRIC BYPASS WITH UPPER ENDOSCOPY, ERAS Pathway;  Surgeon: Johnathan Hausen, MD;  Location: [facility];  Service: General;  Laterality: N/A;  . KNEE ARTHROPLASTY    . KNEE SURGERY  1999   R knee, Dr. Eddie Dibbles, torn cartilage  . RETINAL LASER PROCEDURE Right 08/28/2018   laser retinopexy  . RIGHT/LEFT HEART CATH AND CORONARY ANGIOGRAPHY N/A 01/01/2018   Procedure: RIGHT/LEFT HEART CATH AND CORONARY ANGIOGRAPHY;  Surgeon: Sherren Mocha, MD;  Location: [place] [facility];  Service: Cardiovascular;  Laterality: N/A;  . TONSILLECTOMY  1968  . TONSILLECTOMY    . TOTAL KNEE ARTHROPLASTY Right 09/05/2015   Procedure: RIGHT TOTAL KNEE ARTHROPLASTY;  Surgeon: Gaynelle Arabian, MD;  Location: [facility];  Service: Orthopedics;  Laterality: Right;  . TOTAL KNEE ARTHROPLASTY Left 07/14/2018   Procedure: LEFT TOTAL KNEE ARTHROPLASTY;  Surgeon: Gaynelle Arabian, MD;  Location: [facility];  Service: Orthopedics;  Laterality: Left;  . ULTRASOUND GUIDANCE FOR VASCULAR ACCESS  01/01/2018   Procedure: Ultrasound Guidance For Vascular Access;  Surgeon: Sherren Mocha, MD;  Location: [place] [facility];  Service: Cardiovascular;;  . UMBILICAL HERNIA REPAIR N/A 09/29/2019   Procedure: HERNIA  REPAIR UMBILICAL ADULT;  Surgeon: Johnathan Hausen, MD;  Location: [facility];  Service: General;  Laterality: N/A;   Social History   Social History Narrative   Married, 1 dog. 1 son in [place] (grandson born 06/2017), 1 stepson in [place], with 2 children   Immunization History  Administered Date(s) Administered  . Influenza Inj Mdck Quad Pf 09/13/2019  . Influenza Split 09/17/2011, 09/03/2012, 09/04/2013  . Influenza,inj,Quad PF,6+ Mos 08/16/2014, 07/27/2015, 07/31/2017, 08/13/2018  . Influenza-Unspecified 10/04/2016, 07/31/2017, 09/23/2017  . PFIZER SARS-COV-2 Vaccination 02/20/2020, 03/12/2020  . Tdap 01/16/2011, 06/20/2017  . Zoster Recombinat (Shingrix) 02/05/2019, 07/14/2019     Objective: Vital Signs: BP 99/68 (BP Location: Left Arm, Patient Position: Sitting, Cuff Size: Normal)   Pulse 72   Resp 15   Ht 5' 2.5" (1.588 m)   Wt 168 lb 3.2 oz (76.3 kg)   LMP 07/27/2006   BMI 30.27 kg/m    Physical Exam Vitals and nursing note reviewed.  Constitutional:      Appearance: She is well-developed.  HENT:     Head: Normocephalic and atraumatic.  Eyes:     Conjunctiva/sclera: Conjunctivae normal.  Pulmonary:     Effort: Pulmonary effort is normal.  Abdominal:     General: Bowel sounds are normal.     Palpations: Abdomen is soft.  Musculoskeletal:     Cervical back: Normal range of motion.  Lymphadenopathy:     Cervical: No cervical adenopathy.  Skin:    General: Skin is warm and dry.     Capillary Refill: Capillary refill takes less than 2 seconds.  Neurological:     Mental Status: She is alert and oriented to person, place, and time.  Psychiatric:        Behavior: Behavior normal.      Musculoskeletal Exam: C-spine, thoracic spine, and lumbar spine are in limited range of motion with discomfort.  She has thoracic kyphosis.  Shoulder joints, elbow joints, wrist joints, MCPs, PIPs, and DIPs good ROM with no synovitis.  Her both knee joints are replaced.  Hip joints, knee  joints, ankles were in good range of motion.  She hasdno MTP tenderness.  CDAI Exam: CDAI Score: -- Patient Global: --; Provider Global: -- Swollen: --; Tender: -- Joint Exam 07/04/2020   No joint exam has been documented for this visit   There is currently no information documented on the homunculus. Go to the Rheumatology activity and complete the homunculus joint exam.  Investigation: No additional findings.  Imaging: No results found.  Recent Labs: Lab Results  Component Value Date   WBC 10.7 05/02/2020   HGB 13.6 05/02/2020   PLT 341 05/02/2020   NA 143 05/02/2020   K 4.3 05/02/2020   CL 105 05/02/2020   CO2 24 05/02/2020   GLUCOSE 85 05/02/2020   BUN 24 05/02/2020   CREATININE 0.90 05/02/2020   BILITOT <0.2 05/02/2020   ALKPHOS 138 (H) 05/02/2020   AST 34 05/02/2020   ALT 46 (H) 05/02/2020   PROT 6.3 05/02/2020   ALBUMIN 4.0 05/02/2020   CALCIUM 9.1 05/02/2020   GFRAA 79 05/02/2020    Speciality Comments: No specialty comments available.  Procedures:  Trigger Point Inj  Date/Time: 07/04/2020 11:18 AM Performed by: Bo Merino, MD Authorized by: Bo Merino, MD   Consent Given by:  Patient Site marked: the procedure site was marked   Timeout: prior to procedure the correct patient, procedure, and site was verified   Indications:  Muscle spasm and pain Total # of Trigger Points:  2 Location: neck   Needle Size:  27 G Approach:  Dorsal Medications #1:  0.5 mL lidocaine 1 %; 10 mg triamcinolone acetonide 40 MG/ML Medications #2:  0.5 mL lidocaine 1 %; 10 mg triamcinolone acetonide 40 MG/ML Patient tolerance:  Patient tolerated the procedure well with no immediate complications   Allergies: Erythromycin, Tramadol, and Dilaudid [hydromorphone hcl]   Assessment / Plan:     Visit Diagnoses: Idiopathic chronic gout of multiple sites without tophus - allopurinol 300 mg 1 tablet by mouth daily and colchicine 0.6 mg 1 tablet by mouth as needed.  Her uric acid level was 5.8 on 11/02/2019.  She has not had a gout flare in a long time.  We will check uric acid level today.  Fibromyalgia -she is having fibromyalgia flare.  She states she has been experiencing generalized pain discomfort and fatigue.  Need for regular exercise, stretching and water aerobics was emphasized.  Good sleep hygiene was discussed.  She takes Robaxin on as needed basis.  I will refill Robaxin today.  Side effects of Robaxin were emphasized.  She has been also taking Topamax at bedtime.  I have advised her to taper off Topamax.    Other fatigue-she has been on Nuvigil.  I have advised her to get back on Nuvigil and use it only on as needed basis.  Trapezius muscle spasm-she has been experiencing her left trapezius spasm.  Per her request bilateral trapezius area were injected with cortisone as described above.  History of total knee replacement, bilateral - Performed by Dr. Wynelle Link  Primary osteoarthritis of both hips-currently not having much discomfort.  DDD (degenerative disc disease), cervical-she has limited range of motion without pain.  DDD (degenerative disc disease), lumbar-she had no point tenderness.  She states she had recent fall and vertebral fractures for which she was followed by Dr. Maxie Better.  History of sleep apnea  History of depression  History of migraine  History of vertebral fracture-patient did not require vertebroplasty or kyphoplasty per patient.  She states she does not have any discomfort in her lower back now.  Osteoporosis screening- patient states that DEXA has been scheduled by her GYN.  She had COVID-19 vaccination.  I have advised her to use mask, practice social distancing and hand hygiene despite being vaccinated.  Orders: No orders of the defined types were placed in this encounter.  No orders of the defined types were placed in this  encounter.     Follow-Up Instructions: Return in about 6 months (around 01/04/2021) for  Gout, Fibromyalgia, Osteoarthritis.   Bo Merino, MD  Note - This record has been created using Editor, commissioning.  Chart creation errors have been sought, but may not always  have been located. Such creation errors do not reflect on  the standard of medical care.

## 2020-07-04 ENCOUNTER — Ambulatory Visit: Payer: Medicare Other | Admitting: Rheumatology

## 2020-07-04 ENCOUNTER — Encounter: Payer: Self-pay | Admitting: Rheumatology

## 2020-07-04 ENCOUNTER — Other Ambulatory Visit: Payer: Self-pay

## 2020-07-04 VITALS — BP 99/68 | HR 72 | Resp 15 | Ht 62.5 in | Wt 168.2 lb

## 2020-07-04 DIAGNOSIS — Z96653 Presence of artificial knee joint, bilateral: Secondary | ICD-10-CM

## 2020-07-04 DIAGNOSIS — M5136 Other intervertebral disc degeneration, lumbar region: Secondary | ICD-10-CM

## 2020-07-04 DIAGNOSIS — R5383 Other fatigue: Secondary | ICD-10-CM

## 2020-07-04 DIAGNOSIS — M1A09X0 Idiopathic chronic gout, multiple sites, without tophus (tophi): Secondary | ICD-10-CM | POA: Diagnosis not present

## 2020-07-04 DIAGNOSIS — M62838 Other muscle spasm: Secondary | ICD-10-CM

## 2020-07-04 DIAGNOSIS — M797 Fibromyalgia: Secondary | ICD-10-CM

## 2020-07-04 DIAGNOSIS — Z5181 Encounter for therapeutic drug level monitoring: Secondary | ICD-10-CM

## 2020-07-04 DIAGNOSIS — Z8659 Personal history of other mental and behavioral disorders: Secondary | ICD-10-CM

## 2020-07-04 DIAGNOSIS — M5030 Other cervical disc degeneration, unspecified cervical region: Secondary | ICD-10-CM

## 2020-07-04 DIAGNOSIS — Z13820 Encounter for screening for osteoporosis: Secondary | ICD-10-CM

## 2020-07-04 DIAGNOSIS — Z8669 Personal history of other diseases of the nervous system and sense organs: Secondary | ICD-10-CM

## 2020-07-04 DIAGNOSIS — G4733 Obstructive sleep apnea (adult) (pediatric): Secondary | ICD-10-CM | POA: Diagnosis not present

## 2020-07-04 DIAGNOSIS — M160 Bilateral primary osteoarthritis of hip: Secondary | ICD-10-CM

## 2020-07-04 DIAGNOSIS — Z8781 Personal history of (healed) traumatic fracture: Secondary | ICD-10-CM

## 2020-07-04 LAB — CBC WITH DIFFERENTIAL/PLATELET
Absolute Monocytes: 690 cells/uL (ref 200–950)
Basophils Absolute: 100 cells/uL (ref 0–200)
Basophils Relative: 1 %
Eosinophils Absolute: 160 cells/uL (ref 15–500)
Eosinophils Relative: 1.6 %
HCT: 40.8 % (ref 35.0–45.0)
Hemoglobin: 13.3 g/dL (ref 11.7–15.5)
Lymphs Abs: 3510 cells/uL (ref 850–3900)
MCH: 31.4 pg (ref 27.0–33.0)
MCHC: 32.6 g/dL (ref 32.0–36.0)
MCV: 96.5 fL (ref 80.0–100.0)
MPV: 10.5 fL (ref 7.5–12.5)
Monocytes Relative: 6.9 %
Neutro Abs: 5540 cells/uL (ref 1500–7800)
Neutrophils Relative %: 55.4 %
Platelets: 334 10*3/uL (ref 140–400)
RBC: 4.23 10*6/uL (ref 3.80–5.10)
RDW: 12.4 % (ref 11.0–15.0)
Total Lymphocyte: 35.1 %
WBC: 10 10*3/uL (ref 3.8–10.8)

## 2020-07-04 LAB — COMPLETE METABOLIC PANEL WITH GFR
AG Ratio: 1.6 (calc) (ref 1.0–2.5)
ALT: 181 U/L — ABNORMAL HIGH (ref 6–29)
AST: 53 U/L — ABNORMAL HIGH (ref 10–35)
Albumin: 3.8 g/dL (ref 3.6–5.1)
Alkaline phosphatase (APISO): 161 U/L — ABNORMAL HIGH (ref 37–153)
BUN/Creatinine Ratio: 31 (calc) — ABNORMAL HIGH (ref 6–22)
BUN: 28 mg/dL — ABNORMAL HIGH (ref 7–25)
CO2: 30 mmol/L (ref 20–32)
Calcium: 9.2 mg/dL (ref 8.6–10.4)
Chloride: 105 mmol/L (ref 98–110)
Creat: 0.9 mg/dL (ref 0.50–0.99)
GFR, Est African American: 79 mL/min/{1.73_m2} (ref >=60)
GFR, Est Non African American: 68 mL/min/{1.73_m2} (ref >=60)
Globulin: 2.4 g/dL (calc) (ref 1.9–3.7)
Glucose, Bld: 92 mg/dL (ref 65–99)
Potassium: 4.6 mmol/L (ref 3.5–5.3)
Sodium: 142 mmol/L (ref 135–146)
Total Bilirubin: 0.3 mg/dL (ref 0.2–1.2)
Total Protein: 6.2 g/dL (ref 6.1–8.1)

## 2020-07-04 LAB — URIC ACID: Uric Acid, Serum: 4.4 mg/dL (ref 2.5–7.0)

## 2020-07-04 MED ORDER — TRIAMCINOLONE ACETONIDE 40 MG/ML IJ SUSP
10.0000 mg | INTRAMUSCULAR | Status: AC | PRN
  Administered 2020-07-04: 10 mg via INTRAMUSCULAR

## 2020-07-04 MED ORDER — METHOCARBAMOL 500 MG PO TABS: ORAL_TABLET | ORAL | 0 refills | Status: DC

## 2020-07-04 MED ORDER — LIDOCAINE HCL 1 % IJ SOLN
0.5000 mL | INTRAMUSCULAR | Status: AC | PRN
  Administered 2020-07-04: .5 mL

## 2020-07-05 ENCOUNTER — Ambulatory Visit: Payer: Medicare Other | Admitting: Skilled Nursing Facility1

## 2020-07-05 NOTE — Progress Notes (Signed)
CBC is normal, uric acid is in desirable range. Her liver functions are elevated. I am uncertain if it is due to statin use. I will forward labs to Dr. Tomi Bamberger. Patient may need GI evaluation if no etiology is established. Please discuss results with the patient.

## 2020-07-05 NOTE — Progress Notes (Signed)
Thank you for the update!

## 2020-07-12 ENCOUNTER — Other Ambulatory Visit: Payer: Self-pay

## 2020-07-12 ENCOUNTER — Ambulatory Visit (INDEPENDENT_AMBULATORY_CARE_PROVIDER_SITE_OTHER): Payer: Medicare Other

## 2020-07-12 ENCOUNTER — Other Ambulatory Visit: Payer: Self-pay | Admitting: Obstetrics and Gynecology

## 2020-07-12 DIAGNOSIS — M8589 Other specified disorders of bone density and structure, multiple sites: Secondary | ICD-10-CM

## 2020-07-12 DIAGNOSIS — Z780 Asymptomatic menopausal state: Secondary | ICD-10-CM | POA: Diagnosis not present

## 2020-07-12 DIAGNOSIS — Z9181 History of falling: Secondary | ICD-10-CM

## 2020-07-13 ENCOUNTER — Telehealth: Payer: Self-pay | Admitting: *Deleted

## 2020-07-13 NOTE — Telephone Encounter (Signed)
Patient called and stated that Dr.Deveshwar was to send you labs (looks like they are in system). Her LFT's (CMET) were elevated and she was waiting to hear from you on what to do. Re: medications, chol med, NSAID use, etc.

## 2020-07-13 NOTE — Telephone Encounter (Signed)
Patient advised.

## 2020-07-13 NOTE — Telephone Encounter (Signed)
Advise pt that I did communicate with Dr. Estanislado Pandy about the results.  I do not think the statins are the cause, as she has been on these for quite some time. Not sure if psych meds have been changed and could possibly affect the liver.  I recommended that since she was established with a GI, that she discuss the results with them, to see if any further blood tests or imaging are needed.

## 2020-07-14 ENCOUNTER — Telehealth: Payer: Self-pay | Admitting: Rheumatology

## 2020-07-14 NOTE — Telephone Encounter (Signed)
Patient needs a copy of her last lab results to take to her appointment on 8/26 with GI doctor. Please call when ready to pick up.

## 2020-07-14 NOTE — Telephone Encounter (Signed)
Patient advised her copy of her lab results are ready for pick up.

## 2020-07-21 DIAGNOSIS — K2100 Gastro-esophageal reflux disease with esophagitis, without bleeding: Secondary | ICD-10-CM | POA: Diagnosis not present

## 2020-07-21 DIAGNOSIS — Z79899 Other long term (current) drug therapy: Secondary | ICD-10-CM | POA: Diagnosis not present

## 2020-07-21 DIAGNOSIS — E7800 Pure hypercholesterolemia, unspecified: Secondary | ICD-10-CM | POA: Diagnosis not present

## 2020-07-21 DIAGNOSIS — M797 Fibromyalgia: Secondary | ICD-10-CM | POA: Diagnosis not present

## 2020-07-21 DIAGNOSIS — Z9889 Other specified postprocedural states: Secondary | ICD-10-CM | POA: Diagnosis not present

## 2020-07-21 DIAGNOSIS — R748 Abnormal levels of other serum enzymes: Secondary | ICD-10-CM | POA: Diagnosis not present

## 2020-07-21 DIAGNOSIS — K760 Fatty (change of) liver, not elsewhere classified: Secondary | ICD-10-CM | POA: Diagnosis not present

## 2020-07-21 DIAGNOSIS — K3184 Gastroparesis: Secondary | ICD-10-CM | POA: Diagnosis not present

## 2020-07-21 DIAGNOSIS — K219 Gastro-esophageal reflux disease without esophagitis: Secondary | ICD-10-CM | POA: Diagnosis not present

## 2020-07-25 ENCOUNTER — Ambulatory Visit: Payer: Medicare Other | Admitting: Obstetrics & Gynecology

## 2020-07-25 ENCOUNTER — Telehealth: Payer: Self-pay | Admitting: *Deleted

## 2020-07-25 NOTE — Telephone Encounter (Signed)
Sometimes this can flare if allergies are flaring, so if any allergy symptoms. Be sure that she has started back on meds (ie antihistamines, flonase).  Meclizine is available without a prescription (dramamine-N, bonine, etc), if she needs new, unexpired med.

## 2020-07-25 NOTE — Telephone Encounter (Signed)
Patient called and said she has had vertigo in the past and has been seen for this. Started having what she believes is the same thing again yesterday at 3am (room spinning while lying in bed, some slight staggering when walking and some slight nausea from time to time). She has taken 3 doses of meclizine(old rx) yesterday and 2 today and it helps somewhat. It is out of date though. She wants to know if you have any recommendations?

## 2020-07-25 NOTE — Telephone Encounter (Signed)
Patient advised.

## 2020-07-28 ENCOUNTER — Encounter: Payer: Self-pay | Admitting: Family Medicine

## 2020-07-28 ENCOUNTER — Ambulatory Visit (INDEPENDENT_AMBULATORY_CARE_PROVIDER_SITE_OTHER): Payer: Medicare Other | Admitting: Family Medicine

## 2020-07-28 ENCOUNTER — Other Ambulatory Visit: Payer: Self-pay

## 2020-07-28 VITALS — BP 120/70 | HR 72 | Ht 62.5 in | Wt 165.6 lb

## 2020-07-28 DIAGNOSIS — Z23 Encounter for immunization: Secondary | ICD-10-CM

## 2020-07-28 DIAGNOSIS — H8110 Benign paroxysmal vertigo, unspecified ear: Secondary | ICD-10-CM | POA: Diagnosis not present

## 2020-07-28 DIAGNOSIS — R2681 Unsteadiness on feet: Secondary | ICD-10-CM | POA: Diagnosis not present

## 2020-07-28 DIAGNOSIS — Z79899 Other long term (current) drug therapy: Secondary | ICD-10-CM

## 2020-07-28 NOTE — Patient Instructions (Signed)
Please stay well hydrated.  Goal is for the urine to be very pale/clear. You may continue to use the meclizine as needed for vertigo and feeling off-balance.  This can make you a little drowsy, so use with caution if driving. Please be cautious with your walking--take it slow, have something to hold onto so you don't fall. Try to avoid turning your head to the left, especially when laying down, and avoid other positions which trigger the vertigo. Follow up as scheduled with your neurologist. If you have new/different/worsening neurologic symptoms, you may need more urgent evaluation. We discussed that there was a positional component to the vertigo, which has to do with the inner ear.  If this isn't improving, we discussed referring you for a maneuver from the physical therapists to help treat the vertigo. Your trouble walking might be related, but isn't typical, when you aren't having the dizziness at the same time (when head isn't in a position to trigger your vertigo, balance should be okay). Consider slowing the taper of the topamax (taking the 1/2 tablet daily for a little longer before stopping).

## 2020-07-28 NOTE — Progress Notes (Signed)
Chief Complaint  Patient presents with  . Dizziness    still having dizziness. Wonders if she is having a side effect from the mecliizine.   . Flu Vaccine    would like, but wants to make sure okay with Dr.Knapp-I told her I can come back at end of visit if okay.    Woke up on 8/29 at 3am with room spinning, off balanced with walking, and intermittent nausea.  She took 3 doses of the meclizine on Sunday, which helped with the vertigo, but seemed to be lasting longer than in the past.  If she put her head forwards, the room would spin some, but not as fast. Vertigo persisted on Monday, was milder on Tuesday, but she still was having some balance issues. Yesterday the balance issue wasn't as bad.  Last night she felt worse--related to balance, trouble walking straight.   If she leans forward today, with her head down, she can feel the vertigo/dizziness (not as bad as Sunday). Last meclizine dose was this morning.  She is also feeling jittery. Today she has a headache, mainly at her temples, bilaterally, R>L. Has had some off/on headaches during the week. Dr. Estanislado Pandy is weaning her off the topamax, still on low dose for another 2 days. Denies migraines. She is trying to get her off some of her medications.  She denies any allergy symptoms, congestion, ear plugging/popping, ear pain, tinnitus or decrease in hearing.  Just a short term episode where the R felt plugged, resolved now.  She sees Dr. Valaria Good next week for follow-up.  PMH, PSH, SH reviewed  Outpatient Encounter Medications as of 07/28/2020  Medication Sig Note  . allopurinol (ZYLOPRIM) 300 MG tablet TAKE 1 TABLET BY MOUTH EVERY DAY   . ALPRAZolam (XANAX) 0.25 MG tablet Take 1 tablet (0.25 mg total) by mouth 3 (three) times daily as needed for anxiety.   . ARIPiprazole (ABILIFY) 5 MG tablet Take 1 tablet (5 mg total) by mouth daily.   . Armodafinil 250 MG tablet TAKE 1 TABLET BY MOUTH EVERY DAY WITH BREAKFAST   . atorvastatin  (LIPITOR) 40 MG tablet Take 1 tablet (40 mg total) by mouth daily.   Marland Kitchen CALCIUM PO Take 500 mg by mouth in the morning, at noon, and at bedtime.   . cetirizine (ZYRTEC) 10 MG tablet Take 10 mg by mouth at bedtime.   . DULoxetine (CYMBALTA) 60 MG capsule Take 1 capsule (60 mg total) by mouth 2 (two) times daily.   . fluticasone (FLONASE) 50 MCG/ACT nasal spray PLACE 2 SPRAYS INTO BOTH NOSTRILS DAILY AS NEEDED FOR ALLERGIES (Patient taking differently: Place 2 sprays into both nostrils at bedtime. )   . gabapentin (NEURONTIN) 300 MG capsule Take 300 mg by mouth 3 (three) times daily.   . meclizine (ANTIVERT) 25 MG tablet Take 1 tablet (25 mg total) by mouth 3 (three) times daily as needed for dizziness. 07/28/2020: Last dose this am  . Melatonin 10 MG TABS Take 5 mg by mouth at bedtime.    . metoprolol tartrate (LOPRESSOR) 50 MG tablet TAKE 1 TABLET BY MOUTH TWICE A DAY   . Multiple Vitamins-Minerals (BARIATRIC MULTIVITAMINS/IRON PO) Take 2 each by mouth daily.   . Multiple Vitamins-Minerals (ICAPS AREDS 2) CAPS Take 1 capsule by mouth 2 (two) times daily.   . nortriptyline (PAMELOR) 50 MG capsule Take 150 mg by mouth at bedtime.   . pantoprazole (PROTONIX) 40 MG tablet Take 1 tablet (40 mg total) by mouth daily.   Marland Kitchen  Probiotic Product ([place]) CAPS Take 1 capsule by mouth at bedtime.    Marland Kitchen Propylene Glycol (SYSTANE COMPLETE) 0.6 % SOLN Place 1 drop into both eyes daily at 12 noon.    . RESTASIS 0.05 % ophthalmic emulsion 1 drop 2 (two) times daily.   Marland Kitchen topiramate (TOPAMAX) 25 MG tablet Take 12.5 mg by mouth daily. 07/28/2020: Tapering off  . acetaminophen (TYLENOL) 500 MG tablet Take 1,000 mg by mouth 3 (three) times daily as needed for moderate pain. (Patient not taking: Reported on 07/28/2020) 07/28/2020: Limiting due to elevated LFTs  . colchicine 0.6 MG tablet TAKE 1 TABLET BY MOUTH DAILY AS NEEDED DURING GOUT FLARES. (Patient not taking: Reported on 07/28/2020)   . methocarbamol (ROBAXIN)  500 MG tablet 1 tablet p.o. daily as needed (Patient not taking: Reported on 07/28/2020)   . ondansetron (ZOFRAN) 8 MG tablet Take 8 mg by mouth every 8 (eight) hours as needed for nausea.  (Patient not taking: Reported on 07/28/2020) 05/02/2020: Uses prn, very rarely   No facility-administered encounter medications on file as of 07/28/2020.    Allergies  Allergen Reactions  . Erythromycin Nausea Only    Abdominal pain  . Tramadol Itching  . Dilaudid [Hydromorphone Hcl] Itching   ROS: no fever, chills, chest pain, shortness of breath. Occasional nausea, relieved by zofran. No vomiting or diarrhea. No URI symptoms. Allergies are controlled with zyrtec and flonase. Bitemporal headache today, no recent migraine. Has frequent headaches. See HPI   PHYSICAL EXAM:  BP 120/70   Pulse 72   Ht 5' 2.5" (1.588 m)   Wt 165 lb 9.6 oz (75.1 kg)   LMP 07/27/2006   BMI 29.81 kg/m   Well-appearing female in no distress HEENT: conjunctiva and sclera are clear, EOMI. TM's and EAC's normal. Wearing mask Neck: no lymphadenopathy, thyromegaly or mass Heart: regular rate and rhythm Lungs: clear bilaterally Neuro: alert and oriented.   Slight vertigo with laying supine with head turned to the left No vertigo in other positions (slight with looking down also). She had negative Rhomberg (some swaying, no falling, and not swaying consistently to one side or the other). Some trouble with tandem gait, but not falling to a consistent side, just occasionally needing wider gait for balance.  ASSESSMENT/PLAN:  Benign paroxysmal positional vertigo, unspecified laterality - there is a positional component, though gait instability not c/w BPV. Poss related to meds? meclizine prn. has neuro appt soon. discussed PT if vertigo worse  Unstable gait  Need for influenza vaccination - Plan: Flu Vaccine QUAD 6+ mos PF IM (Fluarix Quad PF)  Polypharmacy - meds being adjusted, ?contributing to balance issues. Agree with  med change.    Please stay well hydrated.  Goal is for the urine to be very pale/clear. You may continue to use the meclizine as needed for vertigo and feeling off-balance.  This can make you a little drowsy, so use with caution if driving. Please be cautious with your walking--take it slow, have something to hold onto so you don't fall. Try to avoid turning your head to the left, especially when laying down, and avoid other positions which trigger the vertigo. Follow up as scheduled with your neurologist. If you have new/different/worsening neurologic symptoms, you may need more urgent evaluation. We discussed that there was a positional component to the vertigo, which has to do with the inner ear.  If this isn't improving, we discussed referring you for a maneuver from the physical therapists to help treat  the vertigo. Your trouble walking might be related, but isn't typical, when you aren't having the dizziness at the same time (when head isn't in a position to trigger your vertigo, balance should be okay). Consider slowing the taper of the topamax (taking the 1/2 tablet daily for a little longer before stopping).  Continue your allergy medications.

## 2020-07-29 ENCOUNTER — Other Ambulatory Visit: Payer: Self-pay | Admitting: Rheumatology

## 2020-07-29 NOTE — Telephone Encounter (Addendum)
Patient states she does not need refill. Patient has tapered off Topamax

## 2020-08-02 ENCOUNTER — Encounter: Payer: Self-pay | Admitting: Obstetrics & Gynecology

## 2020-08-02 ENCOUNTER — Ambulatory Visit: Payer: Medicare Other | Admitting: Obstetrics & Gynecology

## 2020-08-02 ENCOUNTER — Other Ambulatory Visit: Payer: Self-pay

## 2020-08-02 VITALS — BP 126/82

## 2020-08-02 DIAGNOSIS — Z8781 Personal history of (healed) traumatic fracture: Secondary | ICD-10-CM | POA: Diagnosis not present

## 2020-08-02 DIAGNOSIS — M85851 Other specified disorders of bone density and structure, right thigh: Secondary | ICD-10-CM

## 2020-08-02 DIAGNOSIS — Z9049 Acquired absence of other specified parts of digestive tract: Secondary | ICD-10-CM | POA: Diagnosis not present

## 2020-08-02 DIAGNOSIS — R748 Abnormal levels of other serum enzymes: Secondary | ICD-10-CM | POA: Diagnosis not present

## 2020-08-02 DIAGNOSIS — M85852 Other specified disorders of bone density and structure, left thigh: Secondary | ICD-10-CM | POA: Diagnosis not present

## 2020-08-02 NOTE — Progress Notes (Signed)
    [name] [birth year] [id number]        64 y.o.  [name]   RP: Counseling on Bone Density results  HPI: Patient had 2 vertebral fractures after a fall down a staircase in 01/2020.  Patient has Gout, Fibromyalgia and Osteoarthritis.  Now taking vitamin D supplements and calcium.  Not very physically active usually.   OB History  Gravida Para Term Preterm AB Living  1 1 1     1   SAB TAB Ectopic Multiple Live Births               # Outcome Date GA Lbr Len/2nd Weight Sex Delivery Anes PTL Lv  1 Term             Past medical history,surgical history, problem list, medications, allergies, family history and social history were all reviewed and documented in the EPIC chart.   Directed ROS with pertinent positives and negatives documented in the history of present illness/assessment and plan.  Exam:  Vitals:   08/02/20 1125  BP: 126/82   General appearance:  Normal  Bone Density 07/12/2020:  Osteopenia at the bilateral femoral necks with the lowest T-Score at the Rt femoral neck at -2.0.  All other sites including the lumbar spine are normal.   Assessment/Plan:  64 y.o. [name]   1. Osteopenia of necks of both femurs Osteopenia on bone density July 12, 2020.  The lowest T-scores at the right femoral neck at -2.0.  All other sites including the lumbar spine are normal.  Counseling done on osteopenia.  History of 2 vertebral fracture after a severe fall down a staircase in March 2021.  In light of the bone density results being normal at the spine and just osteopenia at 1 site at the right femoral neck and the severity of the fall that caused 2 vertebral fractures, those should not be considered fragility fractures.  The decision was therefore taken not to start on a bone medication at this time, but rather to maximize her vitamin D levels, take calcium 1500 mg daily and increase weightbearing physical activities with walking and light weightlifting at least 5 times a week.  We  will repeat a bone density in 2 years.  2. History of vertebral fracture 2 vertebral fracture after a severe fall down a staircase, not considered fragility fractures.  Bone density normal at the lumbar spine August 2021.  Princess Bruins MD, 11:59 AM 08/02/2020

## 2020-08-03 ENCOUNTER — Other Ambulatory Visit: Payer: Self-pay

## 2020-08-03 ENCOUNTER — Encounter: Payer: Medicare Other | Attending: Surgery | Admitting: Skilled Nursing Facility1

## 2020-08-03 DIAGNOSIS — Z6830 Body mass index (BMI) 30.0-30.9, adult: Secondary | ICD-10-CM | POA: Insufficient documentation

## 2020-08-03 DIAGNOSIS — Z713 Dietary counseling and surveillance: Secondary | ICD-10-CM | POA: Diagnosis not present

## 2020-08-03 DIAGNOSIS — E669 Obesity, unspecified: Secondary | ICD-10-CM | POA: Diagnosis not present

## 2020-08-03 NOTE — Progress Notes (Signed)
Bariatric Nutrition Follow-Up Visit Medical Nutrition Therapy    Post-Operative RYGB Surgery Surgery Date: 10/13/2019 NUTRITION ASSESSMENT    Anthropometrics  Start weight at [facility]: 225.8 lbs (date: 05/06/2018) Today's weight: 164.6  Body Composition Scale 12/28/2019 03/29/2020 05/31/2020 08/03/2020  Total Body Fat % 41.3 39 38.3 37.5  Visceral Fat 13 11 11 11   Fat-Free Mass % 58.6 60.9 61.6 62.4   Total Body Water % 43.8 44.9 45.3 45.7   Muscle-Mass lbs 27.5 27.2 27.1 27.1  Body Fat Displacement             Torso  lbs 48 41.5 39.7 38.1         Left Leg  lbs 9.6 8.3 7.9 7.6         Right Leg  lbs 9.6 8.3 7.9 7.6         Left Arm  lbs 4.8 4.1 3.9 3.8         Right Arm   lbs 4.8 4.1 3.9 3.8    Clinical  Medical hx: migraines, OSA, GERD, kidney stones, fibromyalgia Medications: topamax, protonix, lopressor, gabapentin, hydrocodone, xanax, abilify Labs: see list   Lifestyle & Dietary Hx  Pt states she did have a bought of vertigo last year will visit with her neurologist soon. Pt states she is having a lot of dental work done in the last 2 years due to dry mouth cased by her medication. Pt states she had a bone density test done stating no findings. Pt states she is working on not emotionally eating but it is tuff not to. Pt states she still does her bible study. Pt state she plays on her computer a lot or shops a lot. Pt states she does have some reflux.    Estimated daily fluid intake: 50+ oz Estimated daily protein intake: 60+ g Supplements: bar advantage, calcium  Current average weekly physical activity: will start BELT again    24-Hr Dietary Recall protein shake throughout the week First Meal: cereal + fruit Snack: fruit or cheese Second Meal: steak + sweet potato + roll   Snack: cucumber or cauliflower or carrots Third Meal: salad + corn + steak + banana Snack: nuts or protein bar Beverages: alkaline water 9+; 46-50 ounces; with more occurences of 64+ than before;  gatorade zero  Post-Op Goals/ Signs/ Symptoms Using straws: no Drinking while eating: no Chewing/swallowing difficulties: no Changes in vision: no Changes to mood/headaches: no Hair loss/changes to skin/nails: no Difficulty focusing/concentrating: no Sweating: no Dizziness/lightheadedness: no Palpitations: no  Carbonated/caffeinated beverages: no N/V/D/C/Gas: no Abdominal pain: no Dumping syndrome: no    NUTRITION DIAGNOSIS  Overweight/obesity ([place]-3.3) related to past poor dietary habits and physical inactivity as evidenced by completed bariatric surgery and following dietary guidelines for continued weight loss and healthy nutrition status.     NUTRITION INTERVENTION Nutrition counseling (C-1) and education (E-2) to facilitate bariatric surgery goals, including: . The importance of consuming adequate calories as well as certain nutrients daily due to the body's need for essential vitamins, minerals, and fats . The importance of daily physical activity and to reach a goal of at least 150 minutes of moderate to vigorous physical activity weekly (or as directed by their physician) due to benefits such as increased musculature and improved lab values  Handouts Provided Include    Goals:  Try not to be too hard on yourself you ar doing a wonderful job!  Learning Style & Readiness for Change Teaching method utilized: Visual & Auditory  Demonstrated degree  of understanding via: Teach Back  Barriers to learning/adherence to lifestyle change: none identified   RD's Notes for Next Visit . Assess adherence to pt chosen goals   MONITORING & EVALUATION Dietary intake, weekly physical activity, body weight:

## 2020-08-04 ENCOUNTER — Encounter: Payer: Self-pay | Admitting: Obstetrics & Gynecology

## 2020-08-04 DIAGNOSIS — R202 Paresthesia of skin: Secondary | ICD-10-CM | POA: Diagnosis not present

## 2020-08-04 DIAGNOSIS — R42 Dizziness and giddiness: Secondary | ICD-10-CM | POA: Diagnosis not present

## 2020-08-09 ENCOUNTER — Other Ambulatory Visit: Payer: Self-pay | Admitting: Physician Assistant

## 2020-08-09 NOTE — Telephone Encounter (Signed)
Last Visit: 07/04/2020 Next Visit: 01/02/2021  Okay to refill Armodafinil?

## 2020-08-19 DIAGNOSIS — R7989 Other specified abnormal findings of blood chemistry: Secondary | ICD-10-CM | POA: Diagnosis not present

## 2020-08-19 DIAGNOSIS — R7401 Elevation of levels of liver transaminase levels: Secondary | ICD-10-CM | POA: Diagnosis not present

## 2020-09-04 ENCOUNTER — Other Ambulatory Visit: Payer: Self-pay | Admitting: Family Medicine

## 2020-09-15 ENCOUNTER — Encounter: Payer: Medicare Other | Admitting: Obstetrics and Gynecology

## 2020-09-15 DIAGNOSIS — R4701 Aphasia: Secondary | ICD-10-CM | POA: Diagnosis not present

## 2020-09-15 DIAGNOSIS — R251 Tremor, unspecified: Secondary | ICD-10-CM | POA: Diagnosis not present

## 2020-09-15 DIAGNOSIS — M797 Fibromyalgia: Secondary | ICD-10-CM | POA: Diagnosis not present

## 2020-09-15 DIAGNOSIS — R471 Dysarthria and anarthria: Secondary | ICD-10-CM | POA: Diagnosis not present

## 2020-09-19 DIAGNOSIS — G459 Transient cerebral ischemic attack, unspecified: Secondary | ICD-10-CM | POA: Diagnosis not present

## 2020-09-19 DIAGNOSIS — R4701 Aphasia: Secondary | ICD-10-CM | POA: Diagnosis not present

## 2020-09-21 DIAGNOSIS — I6523 Occlusion and stenosis of bilateral carotid arteries: Secondary | ICD-10-CM | POA: Diagnosis not present

## 2020-09-26 ENCOUNTER — Other Ambulatory Visit: Payer: Self-pay

## 2020-09-26 ENCOUNTER — Ambulatory Visit (INDEPENDENT_AMBULATORY_CARE_PROVIDER_SITE_OTHER): Payer: Medicare Other | Admitting: Podiatry

## 2020-09-26 DIAGNOSIS — L603 Nail dystrophy: Secondary | ICD-10-CM

## 2020-09-26 DIAGNOSIS — B351 Tinea unguium: Secondary | ICD-10-CM | POA: Diagnosis not present

## 2020-09-26 DIAGNOSIS — M79674 Pain in right toe(s): Secondary | ICD-10-CM | POA: Diagnosis not present

## 2020-09-26 MED ORDER — GENTAMICIN SULFATE 0.1 % EX CREA
1.0000 | TOPICAL_CREAM | Freq: Two times a day (BID) | CUTANEOUS | 1 refills | Status: DC
Start: 2020-09-26 — End: 2020-11-07

## 2020-09-26 MED ORDER — DOXYCYCLINE HYCLATE 100 MG PO TABS: 100.0000 mg | ORAL_TABLET | Freq: Two times a day (BID) | ORAL | 0 refills | Status: DC

## 2020-09-26 NOTE — Patient Instructions (Signed)

## 2020-09-26 NOTE — Progress Notes (Signed)
Subjective: Patient presents today for evaluation of pain to the right hallux nail plate.  Patient states that she has had this nail dystrophy for several months now over a year.  She was last seen in the office during the summer and a nail biopsy was performed and sent to pathology which was negative for fungus.  Today she is concerned because the nail plate area is red with drainage.  She is concerned for infection.  She presents for further treatment evaluation  Past Medical History:  Diagnosis Date  . Anemia    previously followed by Dr. Jamse Arn for anemia and elevated platelets  . Anxiety   . C. difficile colitis 10/01/12   treated by [facility]  . Chronic fatigue syndrome   . DDD (degenerative disc disease), lumbar 08/19/14   and facet arthroplasty & left lumbar radiculopathy (Dr.Ramos)  . Depression   . Dyssynergia    dyssynergenic defecation, contributing to fecal incontinence.  . Edema   . Fibromyalgia   . Gastroparesis    followed at [facility]  . GERD (gastroesophageal reflux disease)   . History of kidney stones   . Hyperlipidemia   . Kidney stone   . Lumbar radiculopathy   . Migraine   . Neuropathy   . Obstructive sleep apnea    Does  wear  CPAP  . Paresthesia    Dr. Everette Rank at [facility]  . Pelvic floor dysfunction    pelvic floor dyssynergy  . Plantar fasciitis 02/2011   R foot  . Pneumonia    2012  . PONV (postoperative nausea and vomiting)    pt states has gastroparesis has difficulty taking antibiotics and narcotics has severe nausea and vomiting   . Restless leg syndrome   . S/P endometrial ablation 08/09/2006   Novasure Ablation  . S/P epidural steroid injection 09/20/14   Dr.Ramos  . Tremor    Dr. Everette Rank  . Urinary frequency   . Urinary incontinence     Objective:  General: Well developed, nourished, in no acute distress, alert and oriented x3   Dermatology: Skin is warm, dry and supple bilateral.  Right hallux nail plate appears to be  erythematous with evidence of an low-grade cellulitis of the toe. Pain on palpation noted to the border of the nail folds. The remaining nails appear unremarkable at this time. There are no open sores, lesions.  Vascular: Dorsalis Pedis artery and Posterior Tibial artery pedal pulses palpable. No lower extremity edema noted.   Neruologic: Grossly intact via light touch bilateral.  Musculoskeletal: Muscular strength within normal limits in all groups bilateral. Normal range of motion noted to all pedal and ankle joints.   Assesement: #1  Dystrophic nail right hallux nail plate #2 Pain in toe   Plan of Care:  1. Patient evaluated.  2. Discussed treatment alternatives and plan of care. Explained nail avulsion procedure and post procedure course to patient. 3. Patient opted for permanent total nail avulsion of the right hallux nail plate.  4. Prior to procedure, local anesthesia infiltration utilized using 3 ml of a 50:50 mixture of 2% plain lidocaine and 0.5% plain marcaine in a normal hallux block fashion and a betadine prep performed.  5. Partial permanent nail avulsion with chemical matrixectomy performed using [id number] applications of phenol followed by alcohol flush.  6. Light dressing applied. 7.  Prescription for gentamicin cream applied daily  8.  Prescription for doxycycline 100 mg 2 times daily #20  9.  Return to clinic 3  weeks.  Edrick Kins, DPM [facility]  Dr. Edrick Kins, [address]                                        [place], [place] [address]                Office [phone]  Fax [phone]

## 2020-09-27 ENCOUNTER — Encounter: Payer: Self-pay | Admitting: Obstetrics and Gynecology

## 2020-09-27 ENCOUNTER — Ambulatory Visit (INDEPENDENT_AMBULATORY_CARE_PROVIDER_SITE_OTHER): Payer: Medicare Other | Admitting: Obstetrics and Gynecology

## 2020-09-27 ENCOUNTER — Other Ambulatory Visit: Payer: Self-pay

## 2020-09-27 VITALS — BP 122/80 | Ht 62.0 in | Wt 173.0 lb

## 2020-09-27 DIAGNOSIS — Z01419 Encounter for gynecological examination (general) (routine) without abnormal findings: Secondary | ICD-10-CM | POA: Diagnosis not present

## 2020-09-27 DIAGNOSIS — M85851 Other specified disorders of bone density and structure, right thigh: Secondary | ICD-10-CM | POA: Diagnosis not present

## 2020-09-27 DIAGNOSIS — M85852 Other specified disorders of bone density and structure, left thigh: Secondary | ICD-10-CM

## 2020-09-27 NOTE — Progress Notes (Signed)
[name] 12-05-1955 [id number]  SUBJECTIVE:  64 y.o. [name] female for annual routine gynecologic exam and Pap smear. She has no gynecologic concerns.  Had gastric bypass surgery since her last annual visits and has lost about 50 pounds since her visit last year.  Current Outpatient Medications  Medication Sig Dispense Refill  . acetaminophen (TYLENOL) 500 MG tablet Take 1,000 mg by mouth 3 (three) times daily as needed for moderate pain.     Marland Kitchen allopurinol (ZYLOPRIM) 300 MG tablet TAKE 1 TABLET BY MOUTH EVERY DAY 90 tablet 0  . ALPRAZolam (XANAX) 0.25 MG tablet Take 1 tablet (0.25 mg total) by mouth 3 (three) times daily as needed for anxiety. 90 tablet 2  . ARIPiprazole (ABILIFY) 5 MG tablet Take 1 tablet (5 mg total) by mouth daily. 90 tablet 1  . Armodafinil 250 MG tablet TAKE 1 TABLET BY MOUTH EVERY DAY WITH BREAKFAST 30 tablet 1  . atorvastatin (LIPITOR) 40 MG tablet Take 1 tablet (40 mg total) by mouth daily. 90 tablet 3  . CALCIUM PO Take 500 mg by mouth in the morning, at noon, and at bedtime.    . cetirizine (ZYRTEC) 10 MG tablet Take 10 mg by mouth at bedtime.    . colchicine 0.6 MG tablet TAKE 1 TABLET BY MOUTH DAILY AS NEEDED DURING GOUT FLARES. 30 tablet 0  . doxycycline (VIBRA-TABS) 100 MG tablet Take 1 tablet (100 mg total) by mouth 2 (two) times daily. 20 tablet 0  . DULoxetine (CYMBALTA) 60 MG capsule Take 1 capsule (60 mg total) by mouth 2 (two) times daily. 180 capsule 1  . fluticasone (FLONASE) 50 MCG/ACT nasal spray PLACE 2 SPRAYS INTO BOTH NOSTRILS DAILY AS NEEDED FOR ALLERGIES (Patient taking differently: Place 2 sprays into both nostrils at bedtime. ) 16 g 1  . gabapentin (NEURONTIN) 300 MG capsule Take 300 mg by mouth 3 (three) times daily.    Marland Kitchen gentamicin cream (GARAMYCIN) 0.1 % Apply 1 application topically 2 (two) times daily. 30 g 1  . meclizine (ANTIVERT) 25 MG tablet Take 1 tablet (25 mg total) by mouth 3 (three) times daily as needed for dizziness. 30  tablet 0  . Melatonin 10 MG TABS Take 5 mg by mouth at bedtime.     . methocarbamol (ROBAXIN) 500 MG tablet 1 tablet p.o. daily as needed 30 tablet 0  . metoprolol tartrate (LOPRESSOR) 50 MG tablet TAKE 1 TABLET BY MOUTH TWICE A DAY 180 tablet 0  . Multiple Vitamins-Minerals (BARIATRIC MULTIVITAMINS/IRON PO) Take 2 each by mouth daily.    . Multiple Vitamins-Minerals (ICAPS AREDS 2) CAPS Take 1 capsule by mouth 2 (two) times daily.    . nortriptyline (PAMELOR) 50 MG capsule Take 150 mg by mouth at bedtime.    . ondansetron (ZOFRAN) 8 MG tablet Take 8 mg by mouth every 8 (eight) hours as needed for nausea.   0  . pantoprazole (PROTONIX) 40 MG tablet Take 1 tablet (40 mg total) by mouth daily. 90 tablet 0  . Probiotic Product ([place]) CAPS Take 1 capsule by mouth at bedtime.     Marland Kitchen Propylene Glycol (SYSTANE COMPLETE) 0.6 % SOLN Place 1 drop into both eyes daily at 12 noon.     . RESTASIS 0.05 % ophthalmic emulsion 1 drop 2 (two) times daily.     No current facility-administered medications for this visit.   Allergies: Erythromycin, Tramadol, and Dilaudid [hydromorphone hcl]  Patient's last menstrual period was 07/27/2006.  Past  medical history,surgical history, problem list, medications, allergies, family history and social history were all reviewed and documented as reviewed in the EPIC chart.  ROS: Pertinent positives and negatives as reviewed in HPI    OBJECTIVE:  BP 122/80   Ht 5\' 2"  (1.575 m)   Wt 173 lb (78.5 kg)   LMP 07/27/2006   BMI 31.64 kg/m  The patient appears well, alert, oriented, in no distress. ENT normal.  Neck supple. No cervical or supraclavicular adenopathy or thyromegaly.  Lungs are clear, good air entry, no wheezes, rhonchi or rales. S1 and S2 normal, no murmurs, regular rate and rhythm.  Abdomen soft without tenderness, guarding, mass or organomegaly.  Neurological is normal, no focal findings.  BREAST EXAM: breasts appear normal, no suspicious  masses, no skin or nipple changes or axillary nodes  PELVIC EXAM: VULVA: normal appearing vulva with atrophic change, no masses, tenderness or lesions, VAGINA: normal appearing vagina with atrophic change, normal color and discharge, no lesions, CERVIX: normal appearing cervix without discharge or lesions, UTERUS: uterus is normal size, shape, consistency and nontender, ADNEXA: normal adnexa in size, nontender and no masses  Chaperone: [name] present during the examination  ASSESSMENT:  64 y.o. [name] here for annual gynecologic exam  PLAN:   1. Postmenopausal.  No significant hot flashes or night sweats.  No vaginal bleeding. 2. Pap smear 08/2018.  No significant history of abnormal Pap smears.  Next Pap smear due 2022 following the current guidelines recommending the 3 year interval. 3. Mammogram 08/2019.  Normal breast exam today.  She is reminded to schedule an annual mammogram this year as that is due now. 4. Colonoscopy 2020.  Recommended that she follow up at the recommended interval.   5. Osteopenia. DEXA 06/2020.  T score -2.0 at the right femoral neck.  Previous vertebral fracture after severe fall in 01/2020.  Had consultation with Dr. Jacquiline Doe 07/2020 regarding the osteopenia findings and after discussion of options decided to just continue with calcium supplementation and weightbearing exercise to repeat DEXA in 2 years 2023.  Primary care doctor checked her vitamin D levels.  She was told to hold off on vitamin D as her level was well within the normal range. 6. Health maintenance.  No labs today as she normally has these completed elsewhere.  Return annually or sooner, prn.  Joseph Pierini MD 09/27/20

## 2020-10-04 ENCOUNTER — Other Ambulatory Visit: Payer: Self-pay | Admitting: Family Medicine

## 2020-10-04 DIAGNOSIS — Z1231 Encounter for screening mammogram for malignant neoplasm of breast: Secondary | ICD-10-CM

## 2020-10-07 ENCOUNTER — Other Ambulatory Visit: Payer: Self-pay | Admitting: Rheumatology

## 2020-10-07 NOTE — Telephone Encounter (Signed)
Last Visit: 07/04/2020 Next Visit: 01/02/2021  Last Fill: 08/09/2020  Okay to refill Armodafinil?

## 2020-10-13 DIAGNOSIS — Z9884 Bariatric surgery status: Secondary | ICD-10-CM | POA: Diagnosis not present

## 2020-10-18 ENCOUNTER — Other Ambulatory Visit: Payer: Self-pay | Admitting: Adult Health

## 2020-10-18 ENCOUNTER — Other Ambulatory Visit: Payer: Self-pay | Admitting: Rheumatology

## 2020-10-18 ENCOUNTER — Telehealth: Payer: Self-pay

## 2020-10-18 DIAGNOSIS — G4700 Insomnia, unspecified: Secondary | ICD-10-CM

## 2020-10-18 NOTE — Telephone Encounter (Signed)
Last Visit: 07/04/2020 Next Visit: 01/02/2021  Last Fill: 07/04/2020   Okay to refill Methocarbamol?

## 2020-10-18 NOTE — Telephone Encounter (Signed)
Pt. Requesting refill on her fluticasone last apt was 07/28/20 and next apt is 11/07/20.

## 2020-10-19 MED ORDER — FLUTICASONE PROPIONATE 50 MCG/ACT NA SUSP
NASAL | 0 refills | Status: DC
Start: 2020-10-19 — End: 2020-11-14

## 2020-10-19 NOTE — Telephone Encounter (Signed)
Done

## 2020-10-24 ENCOUNTER — Encounter: Payer: Medicare Other | Attending: Surgery | Admitting: Skilled Nursing Facility1

## 2020-10-24 ENCOUNTER — Other Ambulatory Visit: Payer: Self-pay

## 2020-10-24 ENCOUNTER — Ambulatory Visit: Payer: Medicare Other | Admitting: Podiatry

## 2020-10-24 DIAGNOSIS — Z6841 Body Mass Index (BMI) 40.0 and over, adult: Secondary | ICD-10-CM | POA: Diagnosis not present

## 2020-10-24 DIAGNOSIS — B351 Tinea unguium: Secondary | ICD-10-CM

## 2020-10-24 DIAGNOSIS — Z713 Dietary counseling and surveillance: Secondary | ICD-10-CM | POA: Insufficient documentation

## 2020-10-24 DIAGNOSIS — E669 Obesity, unspecified: Secondary | ICD-10-CM

## 2020-10-24 DIAGNOSIS — M79674 Pain in right toe(s): Secondary | ICD-10-CM

## 2020-10-24 DIAGNOSIS — L603 Nail dystrophy: Secondary | ICD-10-CM | POA: Diagnosis not present

## 2020-10-24 NOTE — Progress Notes (Signed)
   Subjective: 64 y.o. female presents today status post permanent nail avulsion procedure of the right hallux nail plate that was performed on 09/26/2020.  Patient states she is doing well.  She exercised this morning and has noticed a little bit of increased redness to the area.  Other than that she states that she is doing well with no new complaints at this time.   Past Medical History:  Diagnosis Date  . Anemia    previously followed by Dr. Jamse Arn for anemia and elevated platelets  . Anxiety   . C. difficile colitis 10/01/12   treated by [facility]  . Chronic fatigue syndrome   . DDD (degenerative disc disease), lumbar 08/19/14   and facet arthroplasty & left lumbar radiculopathy (Dr.Ramos)  . Depression   . Dyssynergia    dyssynergenic defecation, contributing to fecal incontinence.  . Edema   . Fibromyalgia   . Gastroparesis    followed at [facility]  . GERD (gastroesophageal reflux disease)   . History of kidney stones   . Hyperlipidemia   . Kidney stone   . Lumbar radiculopathy   . Migraine   . Neuropathy   . Obstructive sleep apnea    Does  wear  CPAP  . Paresthesia    Dr. Everette Rank at [facility]  . Pelvic floor dysfunction    pelvic floor dyssynergy  . Plantar fasciitis 02/2011   R foot  . Pneumonia    2012  . PONV (postoperative nausea and vomiting)    pt states has gastroparesis has difficulty taking antibiotics and narcotics has severe nausea and vomiting   . Restless leg syndrome   . S/P endometrial ablation 08/09/2006   Novasure Ablation  . S/P epidural steroid injection 09/20/14   Dr.Ramos  . Tremor    Dr. Everette Rank  . Urinary frequency   . Urinary incontinence     Objective: Skin is warm, dry and supple. Nail and respective nail bed appears to be healing appropriately. Open wound to the associated nail fold with a granular wound base and moderate amount of fibrotic tissue. Minimal drainage noted. Mild erythema around the periungual region likely due to  phenol chemical matricectomy.  Assessment: #1 postop permanent total nail avulsion right hallux nail plate #2 open wound periungual nail fold of respective digit.   Plan of care: #1 patient was evaluated  #2  Light debridement of open wound was performed to the periungual border of the respective toe using a currette. Antibiotic ointment and Band-Aid was applied. #3 patient is to return to clinic on a PRN basis.   Edrick Kins, DPM [facility]  Dr. Edrick Kins, [place]                                        [place], [place] [address]                Office [phone]  Fax [phone]

## 2020-10-24 NOTE — Progress Notes (Signed)
Bariatric Nutrition Follow-Up Visit Medical Nutrition Therapy    Post-Operative RYGB Surgery Surgery Date: 10/13/2019 NUTRITION ASSESSMENT    Anthropometrics  Start weight at [facility]: 225.8 lbs (date: 05/06/2018) Today's weight: 166.3 lbs  Body Composition Scale 12/28/2019 03/29/2020 05/31/2020 08/03/2020 10/24/2020  Total Body Fat % 41.3 39 38.3 37.5 37.9  Visceral Fat 13 11 11 11 11   Fat-Free Mass % 58.6 60.9 61.6 62.4 62   Total Body Water % 43.8 44.9 45.3 45.7 45.5   Muscle-Mass lbs 27.5 27.2 27.1 27.1 27.1  Body Fat Displacement              Torso  lbs 48 41.5 39.7 38.1 39         Left Leg  lbs 9.6 8.3 7.9 7.6 7.8         Right Leg  lbs 9.6 8.3 7.9 7.6 7.8         Left Arm  lbs 4.8 4.1 3.9 3.8 3.9         Right Arm   lbs 4.8 4.1 3.9 3.8 3.9    Clinical  Medical hx: migraines, OSA, GERD, kidney stones, fibromyalgia Medications: topamax, protonix, lopressor, gabapentin, hydrocodone, xanax, abilify Labs: see list   Lifestyle & Dietary Hx Pt states she had to put her dog down. Pt states she feels like her emotional eating habit is getting back stating she wants to snacks more often. Pt states she is slacking in her walking too. Pt states she is eating fruits a lot, for example :3 bananas, grapes, berries. Dietitian suggested to keep 3 servings of fruits in total splitting banana for cereal and for snack later. Pt states she is eating less vegetables. Pt states she is worried about prediabetic condition.  Estimated daily fluid intake: 50+ oz Estimated daily protein intake: 60+ g Supplements: bar advantage, calcium  Current average weekly physical activity: did BELT and will start HOPE  24-Hr Dietary Recall protein shake throughout the week First Meal: Cereal + Banana,   Snack:  Second Meal: rolls Snack: cucumber Third Meal: Salad Snack: Banana Beverages:water + gaterade zero  Post-Op Goals/ Signs/ Symptoms Using straws: no Drinking while eating: no Chewing/swallowing  difficulties: no Changes in vision: no Changes to mood/headaches: no Hair loss/changes to skin/nails: no Difficulty focusing/concentrating: no Sweating: no Dizziness/lightheadedness: no Palpitations: no  Carbonated/caffeinated beverages: no N/V/D/C/Gas: no Abdominal pain: no Dumping syndrome: no    NUTRITION DIAGNOSIS  Overweight/obesity ([place]-3.3) related to past poor dietary habits and physical inactivity as evidenced by completed bariatric surgery and following dietary guidelines for continued weight loss and healthy nutrition status.     NUTRITION INTERVENTION Nutrition counseling (C-1) and education (E-2) to facilitate bariatric surgery goals, including: . The importance of consuming adequate calories as well as certain nutrients daily due to the body's need for essential vitamins, minerals, and fats . The importance of daily physical activity and to reach a goal of at least 150 minutes of moderate to vigorous physical activity weekly (or as directed by their physician) due to benefits such as increased musculature and improved lab values Encouraged patient to honor their body's internal hunger and fullness cues.  Throughout the day, check in mentally and rate hunger. Stop eating when satisfied not full regardless of how much food is left on the plate.  Get more if still hungry 20-30 minutes later.  The key is to honor satisfaction so throughout the meal, rate fullness factor and stop when comfortably satisfied not physically full. The key is to  honor hunger and fullness without any feelings of guilt or shame.  Pay attention to what the internal cues are, rather than any external factors. This will enhance the confidence you have in listening to your own body and following those internal cues enabling you to increase how often you eat when you are hungry not out of appetite and stop when you are satisfied not full.  Encouraged pt to continue to eat balanced meals inclusive of non starchy  vegetables 2 times a day 7 days a week Encouraged pt to choose lean protein sources: limiting beef, pork, sausage, hotdogs, and lunch meat Encourage pt to choose healthy fats such as plant based limiting animal fats . Encouraged pt to continue to drink a minium 64 fluid ounces with half being plain water to satisfy proper hydration   Handouts Provided Include    Goals: Limit to 3 serving of fruits per day:1 banana +another fruits; remember half banana is 1 serving  Aim for non starchy vegetables 2 times a day 7 days a week Get back into your walking reoutine  Try not to be too hard on yourself you ar doing a wonderful job!  Learning Style & Readiness for Change Teaching method utilized: Visual & Auditory  Demonstrated degree of understanding via: Teach Back  Barriers to learning/adherence to lifestyle change: none identified   RD's Notes for Next Visit . Assess adherence to pt chosen goals   MONITORING & EVALUATION Dietary intake, weekly physical activity, body weight:

## 2020-10-25 ENCOUNTER — Other Ambulatory Visit: Payer: Self-pay | Admitting: Adult Health

## 2020-10-25 DIAGNOSIS — F331 Major depressive disorder, recurrent, moderate: Secondary | ICD-10-CM

## 2020-10-25 DIAGNOSIS — F411 Generalized anxiety disorder: Secondary | ICD-10-CM

## 2020-10-27 DIAGNOSIS — K2100 Gastro-esophageal reflux disease with esophagitis, without bleeding: Secondary | ICD-10-CM | POA: Diagnosis not present

## 2020-10-27 DIAGNOSIS — Z79899 Other long term (current) drug therapy: Secondary | ICD-10-CM | POA: Diagnosis not present

## 2020-10-27 DIAGNOSIS — R112 Nausea with vomiting, unspecified: Secondary | ICD-10-CM | POA: Diagnosis not present

## 2020-10-27 DIAGNOSIS — Z9884 Bariatric surgery status: Secondary | ICD-10-CM | POA: Diagnosis not present

## 2020-11-06 NOTE — Progress Notes (Signed)
Chief Complaint  Patient presents with  . fasting med check    Med check, no concerns   Patient presents for 6 month follow-up on chronic problems. 10/17 had a spell of difficulty speaking, trouble getting words out, lasted a few minutes.  She saw Dr. Valaria Good on 10/21.  Carotid US and MRI were ordered. The carotid results are N/A for view by me through [facility]. MRI results: IMPRESSION:  3 mm DWI hyperintensity involving the left internal capsule posterior limb. Differential include subacute lacunar insult versus artifact.  Stable appearance of low-lying cerebellar tonsils.  Mild pansinus disease with active right maxillary inflammation.   He advised her MRI results indicated possible subacute stroke (vs artifact).  She was advised to continue baby aspirin once daily (which she had recently started), and continue adequate treatment of stroke risk factor including hypertension, dyslipidemia.   She denies any allergies or sinus symptoms currently.  She had vertigo last week.  Her meclizine at home has expired. Last week it lasted x 3-4 days, better since Wednesday afternoon.  This was typical vertigo.  She was last seen by me in 07/2020, with suspected BPV, but also some balance issues, possibly felt to be related to medications (that were being changed).    She hasn't had any recent med changes.  Symptoms completely resolved.  Pre-diabetes and obesity:Shehad Roux-En-Y on 09/29/2019. She is followed closely at [facility], seeing the surgeons recently, and also still seeing nutritionist. She has lost approximately 70# so far.  She states the nutritionist told her she is fine where she is at, but she is hoping to lose more weight.  She is following the appropriate diet. She goes to the bariatric exercise program 3x/week.  Lab Results  Component Value Date   HGBA1C 5.7 (H) 05/02/2020    Hyperlipidemia follow-up: Patient is trying to follow a low-fat, low cholesterol diet as best she  can. Compliant with medications (atorvastatin 40mg )and denies medication side effects. Lab Results  Component Value Date   CHOL 116 05/02/2020   HDL 41 05/02/2020   LDLCALC 50 05/02/2020   TRIG 147 05/02/2020   CHOLHDL 2.8 05/02/2020    Hypertension: (never formally diagnosed, because hadbeen on diuretics for fluid retention (stopped a while ago) and beta blockers fortachycardia andtremor--but high BP's noted when not taking these medications in past).  She continues on metoprolol 50mg  BID.She periodically checks her BP at home, can't recall values, but believes 118-120/70's Denies chest pain, palpitations/tachycardia, lightheadedness, edema. BP Readings from Last 3 Encounters:  09/27/20 122/80  08/02/20 126/82  07/28/20 120/70   She is under the care of Dr. Estanislado Pandy forfibromyalgia osteoarthritis, degenerative disc disease, chronic fatigue.   She continues on cymbalta, gabapentin (also for neuropathy in feet), and robaxin 500mg  BID prn. She also prescribes her armodafinil for her chronic fatigue.  She gets massages 1-2x/month.  She has pain daily but reports it is manageable most of the time. Bothers her the most to stand still (washing dishes)  Gout--she continues on allopurinol 300mg  daily, and hasn't had any flares in several years. She has colchicine to use prn flares. Lab Results  Component Value Date   LABURIC 4.4 07/04/2020   She has OSA, under the care of Dr. Radford Pax.She last saw her in 03/2020. She reports compliance, though sometimes she thinks she takes it off in the middle of the night without realizing it.  Overall, she is doing well and should continue use of CPAP.Energy is improved, but still requires Nuvigil.  She  is under the care of Dr. Valaria Good for RLS, neuropathy (pedal paresthesias), tremor (benign essential vs accentuated physiologic tremor), migraines. She takesgabapentinforrestless legs as well as her neuropathy pain.On beta blocker for tremor. On  pamelor for migraine prevention.  Since her last visit she tapered off the topamax. He has also noted episodic asterixis and transient R hand paresthesias, possible median neuropathy.  She no longer gets migraines, but has frequent headaches overall. She is getting temporal headaches frequently, and takes tylenol for them.  She is no longer taking it daily, cut back some due to elevated liver tests.  She takes it about 3x/week.  Denies clenching/grinding teeth. Denies any recent sinus headaches.  Depression:[facility], and isdoingwell on Cymbalta and Abilify.  GERD and h/o gastroparesis: Protonix was cut back to once daily after her Roux-en-Y. Denies any nausea and vomiting, gastroparesis is much better. Only rarely needs zofran, as she still has some nausea.  PMH, PSH, SH reviewed  Outpatient Encounter Medications as of 11/07/2020  Medication Sig Note  . acetaminophen (TYLENOL) 500 MG tablet Take 1,000 mg by mouth 3 (three) times daily as needed for moderate pain.  07/28/2020: Limiting due to elevated LFTs  . allopurinol (ZYLOPRIM) 300 MG tablet TAKE 1 TABLET BY MOUTH EVERY DAY   . ALPRAZolam (XANAX) 0.25 MG tablet TAKE 1 TABLET (0.25 MG TOTAL) BY MOUTH 3 (THREE) TIMES DAILY AS NEEDED FOR ANXIETY.   Marland Kitchen ARIPiprazole (ABILIFY) 5 MG tablet TAKE 1 TABLET BY MOUTH EVERY DAY   . Armodafinil 250 MG tablet TAKE 1 TABLET BY MOUTH EVERY DAY WITH BREAKFAST   . atorvastatin (LIPITOR) 40 MG tablet Take 1 tablet (40 mg total) by mouth daily.   Marland Kitchen CALCIUM PO Take 500 mg by mouth in the morning, at noon, and at bedtime.   . cetirizine (ZYRTEC) 10 MG tablet Take 10 mg by mouth at bedtime.   . DULoxetine (CYMBALTA) 60 MG capsule Take 1 capsule (60 mg total) by mouth 2 (two) times daily.   . fluticasone (FLONASE) 50 MCG/ACT nasal spray PLACE 2 SPRAYS INTO BOTH NOSTRILS DAILY AS NEEDED FOR ALLERGIES   . gabapentin (NEURONTIN) 300 MG capsule Take 300 mg by mouth 3 (three) times daily.   .  meclizine (ANTIVERT) 25 MG tablet Take 1 tablet (25 mg total) by mouth 3 (three) times daily as needed for dizziness.   . Melatonin 10 MG TABS Take 5 mg by mouth at bedtime.    . methocarbamol (ROBAXIN) 500 MG tablet 1 TABLET BY MOUTH DAILY AS NEEDED   . metoprolol tartrate (LOPRESSOR) 50 MG tablet TAKE 1 TABLET BY MOUTH TWICE A DAY 11/07/2020: Uses prn, 2-3x/week  . Multiple Vitamins-Minerals (BARIATRIC MULTIVITAMINS/IRON PO) Take 2 each by mouth daily.   . Multiple Vitamins-Minerals (ICAPS AREDS 2) CAPS Take 1 capsule by mouth 2 (two) times daily.   . nortriptyline (PAMELOR) 50 MG capsule Take 150 mg by mouth at bedtime.   . pantoprazole (PROTONIX) 40 MG tablet Take 1 tablet (40 mg total) by mouth daily.   . Probiotic Product ([place]) CAPS Take 1 capsule by mouth at bedtime.    Marland Kitchen Propylene Glycol 0.6 % SOLN Place 1 drop into both eyes daily at 12 noon.    . RESTASIS 0.05 % ophthalmic emulsion 1 drop 2 (two) times daily.   . colchicine 0.6 MG tablet TAKE 1 TABLET BY MOUTH DAILY AS NEEDED DURING GOUT FLARES. (Patient not taking: Reported on 11/07/2020)   . ondansetron (ZOFRAN) 8 MG  tablet Take 8 mg by mouth every 8 (eight) hours as needed for nausea.  (Patient not taking: Reported on 11/07/2020) 05/02/2020: Uses prn, very rarely  . [DISCONTINUED] doxycycline (VIBRA-TABS) 100 MG tablet Take 1 tablet (100 mg total) by mouth 2 (two) times daily. (Patient not taking: Reported on 11/07/2020)   . [DISCONTINUED] gentamicin cream (GARAMYCIN) 0.1 % Apply 1 application topically 2 (two) times daily. (Patient not taking: Reported on 11/07/2020)    No facility-administered encounter medications on file as of 11/07/2020.   Allergies  Allergen Reactions  . Erythromycin Nausea Only    Abdominal pain  . Tramadol Itching  . Dilaudid [Hydromorphone Hcl] Itching    ROS: no fever, chills, URI symptoms. No chest pain, palpitations. No significant abdominal pain, bowel changes.  Occasional mild  nausea. No dysphagia, no reflux or vomiting Dysarthria--no further occurrence DOE improved since bariatric surgery Headaches per HPI. She has pain in her knees (overall improved since weight loss).   PHYSICAL EXAM:  BP 122/80   Pulse 73   Wt 168 lb 9.6 oz (76.5 kg)   LMP 07/27/2006   BMI 30.84 kg/m   Wt Readings from Last 3 Encounters:  11/07/20 168 lb 9.6 oz (76.5 kg)  10/24/20 166 lb 4.8 oz (75.4 kg)  09/27/20 173 lb (78.5 kg)    Pleasant, obese female, in good spirits, in no distress HEENT: conjunctiva and sclera are clear, EOMI, wearing mask Neck: no lymphadenopathy or mass, no bruit. Heart: regular rate and rhythm Lungs: clear bilaterally, no wheezes, rales, ronchi Abdomen: soft, nontender. No organomegaly or mass Extremities: no edema, normal pulses Skin: normal turgor, no rash Psych: normal mood, affect, hygiene and grooming Neuro: alert and oriented. Normal gait. No significant tremor noted.   ASSESSMENT/PLAN:  Pre-diabetes - cont low carb diet, regular exercise, weight loss - Plan: Hemoglobin A1c, Comprehensive metabolic panel  Gastroesophageal reflux disease without esophagitis - well controlled on PPI once daily  Mixed hyperlipidemia - cont atorvastatin and low cholesterol diet.  At goal on last check  History of Roux-en-Y gastric bypass  Fibromyalgia - stable, pain is manageable on current regimen  OSA (obstructive sleep apnea) - cont nightly use of CPAP  Restless leg syndrome - controlled on current medication regimen  Idiopathic peripheral neuropathy - controlled  Migraine without status migrainosus, not intractable, unspecified migraine type - no recent migraines, doing well even after topamax stopped  Medication monitoring encounter - Plan: CBC with Differential/Platelet, Comprehensive metabolic panel   F/u 6 months for CPE, sooner prn

## 2020-11-07 ENCOUNTER — Other Ambulatory Visit: Payer: Self-pay

## 2020-11-07 ENCOUNTER — Ambulatory Visit (INDEPENDENT_AMBULATORY_CARE_PROVIDER_SITE_OTHER): Payer: Medicare Other | Admitting: Family Medicine

## 2020-11-07 ENCOUNTER — Encounter: Payer: Self-pay | Admitting: Family Medicine

## 2020-11-07 VITALS — BP 122/80 | HR 73 | Wt 168.6 lb

## 2020-11-07 DIAGNOSIS — Z9884 Bariatric surgery status: Secondary | ICD-10-CM | POA: Diagnosis not present

## 2020-11-07 DIAGNOSIS — Z5181 Encounter for therapeutic drug level monitoring: Secondary | ICD-10-CM | POA: Diagnosis not present

## 2020-11-07 DIAGNOSIS — E782 Mixed hyperlipidemia: Secondary | ICD-10-CM | POA: Diagnosis not present

## 2020-11-07 DIAGNOSIS — R7303 Prediabetes: Secondary | ICD-10-CM | POA: Diagnosis not present

## 2020-11-07 DIAGNOSIS — K219 Gastro-esophageal reflux disease without esophagitis: Secondary | ICD-10-CM

## 2020-11-07 DIAGNOSIS — G4733 Obstructive sleep apnea (adult) (pediatric): Secondary | ICD-10-CM

## 2020-11-07 DIAGNOSIS — M797 Fibromyalgia: Secondary | ICD-10-CM | POA: Diagnosis not present

## 2020-11-07 DIAGNOSIS — G609 Hereditary and idiopathic neuropathy, unspecified: Secondary | ICD-10-CM

## 2020-11-07 DIAGNOSIS — G2581 Restless legs syndrome: Secondary | ICD-10-CM

## 2020-11-07 DIAGNOSIS — G43909 Migraine, unspecified, not intractable, without status migrainosus: Secondary | ICD-10-CM

## 2020-11-07 LAB — HEMOGLOBIN A1C
Est. average glucose Bld gHb Est-mCnc: 111 mg/dL
Hgb A1c MFr Bld: 5.5 % (ref 4.8–5.6)

## 2020-11-07 LAB — COMPREHENSIVE METABOLIC PANEL
ALT: 46 IU/L — ABNORMAL HIGH (ref 0–32)
AST: 40 IU/L (ref 0–40)
Albumin/Globulin Ratio: 1.5 (ref 1.2–2.2)
Albumin: 3.7 g/dL — ABNORMAL LOW (ref 3.8–4.8)
Alkaline Phosphatase: 119 IU/L (ref 44–121)
BUN/Creatinine Ratio: 25 (ref 12–28)
BUN: 22 mg/dL (ref 8–27)
Bilirubin Total: 0.3 mg/dL (ref 0.0–1.2)
CO2: 25 mmol/L (ref 20–29)
Calcium: 9.2 mg/dL (ref 8.7–10.3)
Chloride: 104 mmol/L (ref 96–106)
Creatinine, Ser: 0.88 mg/dL (ref 0.57–1.00)
GFR calc Af Amer: 81 mL/min/{1.73_m2} (ref >59)
GFR calc non Af Amer: 70 mL/min/{1.73_m2} (ref >59)
Globulin, Total: 2.4 g/dL (ref 1.5–4.5)
Glucose: 98 mg/dL (ref 65–99)
Potassium: 4.6 mmol/L (ref 3.5–5.2)
Sodium: 144 mmol/L (ref 134–144)
Total Protein: 6.1 g/dL (ref 6.0–8.5)

## 2020-11-07 LAB — CBC WITH DIFFERENTIAL/PLATELET
Basophils Absolute: 0.1 10*3/uL (ref 0.0–0.2)
Basos: 1 %
EOS (ABSOLUTE): 0.3 10*3/uL (ref 0.0–0.4)
Eos: 3 %
Hematocrit: 37.4 % (ref 34.0–46.6)
Hemoglobin: 12.7 g/dL (ref 11.1–15.9)
Immature Grans (Abs): 0 10*3/uL (ref 0.0–0.1)
Immature Granulocytes: 0 %
Lymphocytes Absolute: 2.4 10*3/uL (ref 0.7–3.1)
Lymphs: 32 %
MCH: 31.3 pg (ref 26.6–33.0)
MCHC: 34 g/dL (ref 31.5–35.7)
MCV: 92 fL (ref 79–97)
Monocytes Absolute: 0.7 10*3/uL (ref 0.1–0.9)
Monocytes: 9 %
Neutrophils Absolute: 4.3 10*3/uL (ref 1.4–7.0)
Neutrophils: 55 %
Platelets: 282 10*3/uL (ref 150–450)
RBC: 4.06 x10E6/uL (ref 3.77–5.28)
RDW: 11.9 % (ref 11.7–15.4)
WBC: 7.7 10*3/uL (ref 3.4–10.8)

## 2020-11-07 NOTE — Patient Instructions (Signed)
Meclizine is available over-the-counter at 12.5 and 25mg  tablets (ask the pharmacist, [place], Dramamine-N). I suggest starting with the 12.5mg  dose as it is potentially sedating, as are many of your other medications. If ineffective for treating the vertigo, you can take the additional 12.5mg  (if not too sleepy).

## 2020-11-08 ENCOUNTER — Ambulatory Visit: Payer: Medicare Other | Admitting: Adult Health

## 2020-11-08 ENCOUNTER — Encounter: Payer: Self-pay | Admitting: Adult Health

## 2020-11-08 ENCOUNTER — Ambulatory Visit (INDEPENDENT_AMBULATORY_CARE_PROVIDER_SITE_OTHER): Payer: Medicare Other | Admitting: Adult Health

## 2020-11-08 DIAGNOSIS — F411 Generalized anxiety disorder: Secondary | ICD-10-CM

## 2020-11-08 DIAGNOSIS — G4700 Insomnia, unspecified: Secondary | ICD-10-CM

## 2020-11-08 DIAGNOSIS — F3181 Bipolar II disorder: Secondary | ICD-10-CM | POA: Diagnosis not present

## 2020-11-08 DIAGNOSIS — F331 Major depressive disorder, recurrent, moderate: Secondary | ICD-10-CM | POA: Diagnosis not present

## 2020-11-08 MED ORDER — DULOXETINE HCL 60 MG PO CPEP: 60.0000 mg | ORAL_CAPSULE | Freq: Two times a day (BID) | ORAL | 1 refills | Status: DC

## 2020-11-08 MED ORDER — ARIPIPRAZOLE 5 MG PO TABS: 5.0000 mg | ORAL_TABLET | Freq: Every day | ORAL | 1 refills | Status: DC

## 2020-11-08 MED ORDER — ALPRAZOLAM 0.25 MG PO TABS: 0.2500 mg | ORAL_TABLET | Freq: Three times a day (TID) | ORAL | 2 refills | Status: DC | PRN

## 2020-11-08 NOTE — Progress Notes (Signed)
[name] [id number] July 29, 1956 64 y.o.  Subjective:   Patient ID:  [name] is a 64 y.o. (DOB [birth year]) female.  Chief Complaint: No chief complaint on file.   HPI [name] presents to the office today for follow-up of anxiety, depression, and insomnia.  Describes mood today as "ok". Pleasant. Mood symptoms - reports some depression, anxiety, and irritability. Stating "yesterday wasn't a good day". Husband with health issues. Reports friction with son in [place]. Bariatric surgery 11/20 - weight loss 65 pounds. Working with weight loss program to keep her "accountable". Mother - age 37 - lives alone - eye sight and hearing bad. Bringing her meals. Stable interest and motivation. Taking medications as prescribed.  Energy levels stable. Active, does not have a regular exercise routine. Enjoys some usual interests and activities. Married. Lives with husband of 27 years. Has 2 sons - one local and another one in [place]. Mother local. Spending time with family. Appetite adequate. Weight loss. Sleeps better some nights than others. Averages 5 to 7 hours. Focus and concentration stable. Completing tasks. Managing aspects of household.  Denies SI or HI.  Denies AH or VH.   PHQ2-9   Flowsheet Row Clinical Support from 11/07/2020 in [facility] [place] Visit from 05/02/2020 in [facility] Visit from 04/15/2019 in [facility] Nutrition from 05/06/2018 in [facility]  PHQ-2 Total Score 0 0 0 0 0       Review of Systems:  Review of Systems  Musculoskeletal: Negative for gait problem.  Neurological: Negative for tremors.  Psychiatric/Behavioral:       Please refer to HPI    Medications: I have reviewed the patient's current medications.  Current Outpatient Medications  Medication Sig Dispense Refill  . acetaminophen (TYLENOL) 500  MG tablet Take 1,000 mg by mouth 3 (three) times daily as needed for moderate pain.     Marland Kitchen allopurinol (ZYLOPRIM) 300 MG tablet TAKE 1 TABLET BY MOUTH EVERY DAY 90 tablet 0  . ALPRAZolam (XANAX) 0.25 MG tablet Take 1 tablet (0.25 mg total) by mouth 3 (three) times daily as needed for anxiety. 90 tablet 2  . ARIPiprazole (ABILIFY) 5 MG tablet Take 1 tablet (5 mg total) by mouth daily. 90 tablet 1  . Armodafinil 250 MG tablet TAKE 1 TABLET BY MOUTH EVERY DAY WITH BREAKFAST 30 tablet 1  . atorvastatin (LIPITOR) 40 MG tablet Take 1 tablet (40 mg total) by mouth daily. 90 tablet 3  . CALCIUM PO Take 500 mg by mouth in the morning, at noon, and at bedtime.    . cetirizine (ZYRTEC) 10 MG tablet Take 10 mg by mouth at bedtime.    . colchicine 0.6 MG tablet TAKE 1 TABLET BY MOUTH DAILY AS NEEDED DURING GOUT FLARES. (Patient not taking: Reported on 11/07/2020) 30 tablet 0  . DULoxetine (CYMBALTA) 60 MG capsule Take 1 capsule (60 mg total) by mouth 2 (two) times daily. 180 capsule 1  . fluticasone (FLONASE) 50 MCG/ACT nasal spray PLACE 2 SPRAYS INTO BOTH NOSTRILS DAILY AS NEEDED FOR ALLERGIES 16 g 0  . gabapentin (NEURONTIN) 300 MG capsule Take 300 mg by mouth 3 (three) times daily.    . meclizine (ANTIVERT) 25 MG tablet Take 1 tablet (25 mg total) by mouth 3 (three) times daily as needed for dizziness. 30 tablet 0  . Melatonin 10 MG TABS Take 5 mg by mouth at bedtime.     Marland Kitchen  methocarbamol (ROBAXIN) 500 MG tablet 1 TABLET BY MOUTH DAILY AS NEEDED 30 tablet 0  . metoprolol tartrate (LOPRESSOR) 50 MG tablet TAKE 1 TABLET BY MOUTH TWICE A DAY 180 tablet 0  . Multiple Vitamins-Minerals (BARIATRIC MULTIVITAMINS/IRON PO) Take 2 each by mouth daily.    . Multiple Vitamins-Minerals (ICAPS AREDS 2) CAPS Take 1 capsule by mouth 2 (two) times daily.    . nortriptyline (PAMELOR) 50 MG capsule Take 150 mg by mouth at bedtime.    . ondansetron (ZOFRAN) 8 MG tablet Take 8 mg by mouth every 8 (eight) hours as needed for  nausea.  (Patient not taking: Reported on 11/07/2020)  0  . pantoprazole (PROTONIX) 40 MG tablet Take 1 tablet (40 mg total) by mouth daily. 90 tablet 0  . Probiotic Product (Davis) CAPS Take 1 capsule by mouth at bedtime.     Marland Kitchen Propylene Glycol 0.6 % SOLN Place 1 drop into both eyes daily at 12 noon.     . RESTASIS 0.05 % ophthalmic emulsion 1 drop 2 (two) times daily.     No current facility-administered medications for this visit.    Medication Side Effects: None  Allergies:  Allergies  Allergen Reactions  . Erythromycin Nausea Only    Abdominal pain  . Tramadol Itching  . Dilaudid [Hydromorphone Hcl] Itching    Past Medical History:  Diagnosis Date  . Anemia    previously followed by Dr. Jamse Arn for anemia and elevated platelets  . Anxiety   . C. difficile colitis 10/01/12   treated by [facility]  . Chronic fatigue syndrome   . DDD (degenerative disc disease), lumbar 08/19/14   and facet arthroplasty & left lumbar radiculopathy (Dr.Ramos)  . Depression   . Dyssynergia    dyssynergenic defecation, contributing to fecal incontinence.  . Edema   . Fibromyalgia   . Gastroparesis    follow[facility] at [facility]  . GERD (gastroesophageal reflux disease)   . History of kidney stones   . Hyperlipidemia   . Kidney stone   . Lumbar radiculopathy   . Migraine   . Neuropathy   . Obstructive sleep apnea    Does  wear  CPAP  . Paresthesia    Dr. Everette Rank at [facility]  . Pelvic floor dysfunction    pelvic floor dyssynergy  . Plantar fasciitis 02/2011   R foot  . Pneumonia    2012  . PONV (postoperative nausea and vomiting)    pt states has gastroparesis has difficulty taking antibiotics and narcotics has severe nausea and vomiting   . Restless leg syndrome   . S/P endometrial ablation 08/09/2006   Novasure Ablation  . S/P epidural steroid injection 09/20/14   Dr.Ramos  . Tremor    Dr. Everette Rank  . Urinary frequency   . Urinary incontinence     Family History   Problem Relation Age of Onset  . Allergies Mother   . Hypertension Mother   . Heart disease Mother        possible valve problem - leaking valve  . Macular degeneration Mother   . Heart disease Father        pacemaker, CHF  . Hypertension Father   . Diabetes Father        borderline  . Stroke Father 37  . Kidney disease Father   . Asthma Sister   . Irritable bowel syndrome Sister   . Allergies Sister   . Heart disease Paternal Grandmother   . Heart disease  Paternal Grandfather   . Cancer Maternal Aunt        leukemia  . Cancer Maternal Aunt   . Colon cancer Maternal Aunt        late 15's  . Heart disease Maternal Grandmother   . Heart disease Maternal Grandfather   . CAD Neg Hx     Social History   Socioeconomic History  . Marital status: Married    Spouse name: Not on file  . Number of children: 2  . Years of education: Not on file  . Highest education level: Not on file  Occupational History  . Occupation: Therapist, art (on disability)    Employer: [employer]  Tobacco Use  . Smoking status: Never Smoker  . Smokeless tobacco: Never Used  Vaping Use  . Vaping Use: Never used  Substance and Sexual Activity  . Alcohol use: No    Alcohol/week: 0.0 standard drinks  . Drug use: No  . Sexual activity: Not Currently    Birth control/protection: Post-menopausal    Comment: 1st intercourse 64 yo-Fewer than 5 partners  Other Topics Concern  . Not on file  Social History Narrative   Married, 1 dog. 1 son in [place] (grandson born 06/2017), 1 stepson in [place], with 2 children   Social Determinants of Health   Financial Resource Strain: Not on file  Food Insecurity: Not on file  Transportation Needs: Not on file  Physical Activity: Not on file  Stress: Not on file  Social Connections: Not on file  Intimate Partner Violence: Not on file    Past Medical History, Surgical history, Social history, and Family history were reviewed and updated as appropriate.    Please see review of systems for further details on the patient's review from today.   Objective:   Physical Exam:  LMP 07/27/2006   Physical Exam Constitutional:      General: She is not in acute distress. Musculoskeletal:        General: No deformity.  Neurological:     Mental Status: She is alert and oriented to person, place, and time.     Coordination: Coordination normal.  Psychiatric:        Attention and Perception: Attention and perception normal. She does not perceive auditory or visual hallucinations.        Mood and Affect: Mood normal. Mood is not anxious or depressed. Affect is not labile, blunt, angry or inappropriate.        Speech: Speech normal.        Behavior: Behavior normal.        Thought Content: Thought content normal. Thought content is not paranoid or delusional. Thought content does not include homicidal or suicidal ideation. Thought content does not include homicidal or suicidal plan.        Cognition and Memory: Cognition and memory normal.        Judgment: Judgment normal.     Comments: Insight intact     Lab Review:     Component Value Date/Time   NA 144 11/07/2020 1029   K 4.6 11/07/2020 1029   CL 104 11/07/2020 1029   CO2 25 11/07/2020 1029   GLUCOSE 98 11/07/2020 1029   GLUCOSE 92 07/04/2020 1129   BUN 22 11/07/2020 1029   CREATININE 0.88 11/07/2020 1029   CREATININE 0.90 07/04/2020 1129   CALCIUM 9.2 11/07/2020 1029   PROT 6.1 11/07/2020 1029   ALBUMIN 3.7 (L) 11/07/2020 1029   AST 40 11/07/2020 1029  ALT 46 (H) 11/07/2020 1029   ALKPHOS 119 11/07/2020 1029   BILITOT 0.3 11/07/2020 1029   GFRNONAA 70 11/07/2020 1029   GFRNONAA 68 07/04/2020 1129   GFRAA 81 11/07/2020 1029   GFRAA 79 07/04/2020 1129       Component Value Date/Time   WBC 7.7 11/07/2020 1029   WBC 10.0 07/04/2020 1129   RBC 4.06 11/07/2020 1029   RBC 4.23 07/04/2020 1129   HGB 12.7 11/07/2020 1029   HGB 11.7 05/02/2011 1412   HCT 37.4 11/07/2020 1029    HCT 37.1 05/02/2011 1412   PLT 282 11/07/2020 1029   MCV 92 11/07/2020 1029   MCV 94.4 05/02/2011 1412   MCH 31.3 11/07/2020 1029   MCH 31.4 07/04/2020 1129   MCHC 34.0 11/07/2020 1029   MCHC 32.6 07/04/2020 1129   RDW 11.9 11/07/2020 1029   RDW 18.3 (H) 05/02/2011 1412   LYMPHSABS 2.4 11/07/2020 1029   LYMPHSABS 2.4 05/02/2011 1412   MONOABS 1.2 (H) 10/01/2019 0328   MONOABS 0.8 05/02/2011 1412   EOSABS 0.3 11/07/2020 1029   BASOSABS 0.1 11/07/2020 1029   BASOSABS 0.1 05/02/2011 1412    No results found for: POCLITH, LITHIUM   No results found for: PHENYTOIN, PHENOBARB, VALPROATE, CBMZ   .res Assessment: Plan:    Plan:  1. Xanax 0.25mg  TID 2. Cymbalta 60mg  BID 3. Abilify 5mg  daily  Melatonin PRN  RTC 6 months  Patient advised to contact office with any questions, adverse effects, or acute worsening in signs and symptoms.  Discussed potential metabolic side effects associated with atypical antipsychotics, as well as potential risk for movement side effects. Advised pt to contact office if movement side effects occur.   Discussed potential metabolic side effects associated with atypical antipsychotics, as well as potential risk for movement side effects. Advised pt to contact office if movement side effects occur.    Diagnoses and all orders for this visit:  Bipolar II disorder ([place])  Insomnia, unspecified type -     ALPRAZolam (XANAX) 0.25 MG tablet; Take 1 tablet (0.25 mg total) by mouth 3 (three) times daily as needed for anxiety.  Major depressive disorder, recurrent episode, moderate (HCC) -     ARIPiprazole (ABILIFY) 5 MG tablet; Take 1 tablet (5 mg total) by mouth daily. -     DULoxetine (CYMBALTA) 60 MG capsule; Take 1 capsule (60 mg total) by mouth 2 (two) times daily.  Generalized anxiety disorder -     ARIPiprazole (ABILIFY) 5 MG tablet; Take 1 tablet (5 mg total) by mouth daily. -     DULoxetine (CYMBALTA) 60 MG capsule; Take 1 capsule (60 mg total)  by mouth 2 (two) times daily.     Please see After Visit Summary for patient specific instructions.  Future Appointments  Date Time Provider [place]  11/11/2020  3:10 PM [facility] MM 3 [facility]  01/02/2021 10:15 AM Bo Merino, MD [facility] None  01/30/2021 11:00 AM Scotece, Francesco Sor, RD [place] [facility]  06/28/2021  9:45 AM Rita Ohara, MD [facility]    No orders of the defined types were placed in this encounter.   -------------------------------

## 2020-11-10 DIAGNOSIS — R471 Dysarthria and anarthria: Secondary | ICD-10-CM | POA: Diagnosis not present

## 2020-11-10 DIAGNOSIS — R202 Paresthesia of skin: Secondary | ICD-10-CM | POA: Diagnosis not present

## 2020-11-11 ENCOUNTER — Other Ambulatory Visit: Payer: Self-pay

## 2020-11-11 ENCOUNTER — Ambulatory Visit
Admission: RE | Admit: 2020-11-11 | Discharge: 2020-11-11 | Disposition: A | Discharge status: Home / Self Care | Payer: Medicare Other | Source: Ambulatory Visit | Attending: Family Medicine | Admitting: Family Medicine

## 2020-11-11 DIAGNOSIS — Z1231 Encounter for screening mammogram for malignant neoplasm of breast: Secondary | ICD-10-CM | POA: Diagnosis not present

## 2020-11-13 ENCOUNTER — Other Ambulatory Visit: Payer: Self-pay | Admitting: Family Medicine

## 2020-11-16 ENCOUNTER — Telehealth: Payer: Self-pay | Admitting: *Deleted

## 2020-11-16 NOTE — Telephone Encounter (Signed)
error 

## 2020-11-30 ENCOUNTER — Other Ambulatory Visit: Payer: Self-pay | Admitting: Physician Assistant

## 2020-11-30 ENCOUNTER — Other Ambulatory Visit: Payer: Self-pay | Admitting: Family Medicine

## 2020-11-30 NOTE — Telephone Encounter (Signed)
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

## 2020-12-06 ENCOUNTER — Other Ambulatory Visit: Payer: Self-pay | Admitting: Rheumatology

## 2020-12-06 NOTE — Telephone Encounter (Signed)
Last Visit: 07/04/2020 Next Visit: 01/02/2021  Current Dose per office note on 07/04/2020, Nuvigil.  I have advised her to get back on Nuvigil and use it only on as needed basis. Dx: Idiopathic chronic gout of multiple sites without tophus -  Okay to refill Armodafinil?

## 2020-12-19 ENCOUNTER — Other Ambulatory Visit: Payer: Self-pay | Admitting: Family Medicine

## 2020-12-19 NOTE — Progress Notes (Signed)
Office Visit Note  Patient: [name]             Date of Birth: December 06, 1955           MRN: [id number]             PCP: Rita Ohara, MD Referring: Rita Ohara, MD Visit Date: 01/02/2021 Occupation: @GUAROCC @  Subjective:  Neck pain.   History of Present Illness: [name] is a 65 y.o. female with history of gout, osteoarthritis and fibromyalgia syndrome.  She states that she continues to have some neck pain and stiffness.  She has been having spasm in the right trapezius area and would like to have trigger point injection.  Her fibromyalgia symptoms are fairly well controlled.  She has not had a gout flare since her last visit.  Her replaced knee joints are doing well.  She has off-and-on discomfort in her hips.  Activities of Daily Living:  Patient reports morning stiffness for 0 minutes.   Patient Reports nocturnal pain.  Difficulty dressing/grooming: Denies Difficulty climbing stairs: Denies Difficulty getting out of chair: Denies Difficulty using hands for taps, buttons, cutlery, and/or writing: Denies  Review of Systems  Constitutional: Positive for fatigue.  HENT: Positive for mouth dryness. Negative for mouth sores and nose dryness.   Eyes: Negative for pain, itching and dryness.  Respiratory: Negative for shortness of breath and difficulty breathing.   Cardiovascular: Negative for chest pain and palpitations.  Gastrointestinal: Positive for constipation. Negative for blood in stool and diarrhea.  Endocrine: Negative for increased urination.  Genitourinary: Negative for difficulty urinating.  Musculoskeletal: Positive for myalgias, muscle tenderness and myalgias. Negative for arthralgias, joint pain, joint swelling and morning stiffness.  Skin: Negative for color change, rash and redness.  Allergic/Immunologic: Negative for susceptible to infections.  Neurological: Positive for headaches. Negative for dizziness, numbness, memory loss and weakness.  Hematological:  Negative for bruising/bleeding tendency.  Psychiatric/Behavioral: Negative for confusion.    PMFS History:  Patient Active Problem List   Diagnosis Date Noted  . Gastric bypass status for obesity 09/29/2019  . Aftercare 08/12/2018  . Pain in left knee 06/03/2018  . Dyspnea 12/26/2017  . Restrictive lung disease secondary to obesity 12/26/2017  . Atypical chest pain 12/06/2017  . Sinus tachycardia 12/06/2017  . Chest pain 12/06/2017  . DJD (degenerative joint disease), cervical 12/24/2016  . Primary osteoarthritis of both hips 12/24/2016  . Primary osteoarthritis of both knees 12/24/2016  . H/O total knee replacement, right 12/24/2016  . Spondylosis of lumbar region without myelopathy or radiculopathy 12/24/2016  . Lumbosacral spondylosis without myelopathy 12/24/2016  . Acute gout 05/30/2016  . Gout 05/30/2016  . Myalgia 03/29/2016  . Other long term (current) drug therapy 03/29/2016  . Idiopathic peripheral neuropathy 03/29/2016  . Cannot sleep 03/29/2016  . Migraine without aura and responsive to treatment 03/29/2016  . Multifocal myoclonus 03/29/2016  . Restless leg 03/29/2016  . Has a tremor 03/29/2016  . History of aspiration pneumonitis 01/25/2016  . History of acute bronchitis 01/25/2016  . LPRD (laryngopharyngeal reflux disease) 01/25/2016  . Imbalance 01/09/2016  . Serotonin syndrome 12/22/2015  . Hypertensive disorder 09/10/2015  . OA (osteoarthritis) of knee 09/05/2015  . Obesity 05/17/2015  . OSA (obstructive sleep apnea) 03/16/2013  . Iron deficiency 11/06/2012  . Thrombocythemia 11/06/2012  . Leukocytosis 11/05/2012  . Impaired fasting glucose 07/23/2012  . Bronchitis 04/03/2012  . Kidney stone on left side 03/05/2012  . Kidney cysts 03/04/2012  . Loose stools 03/04/2012  .  Urinary frequency 12/04/2011  . Restless leg syndrome 12/04/2011  . Polypharmacy 12/04/2011  . Bladder incontinence 12/04/2011  . Fibromyalgia   . Hyperlipidemia   . Edema   .  S/P endometrial ablation   . Allergic rhinitis 09/18/2011  . Depression, major, single episode, in partial remission ([place]) 04/21/2011  . PRECORDIAL PAIN 02/17/2010  . Anxiety state 01/30/2010  . Migraine 01/30/2010  . GERD 01/30/2010  . Gastroparesis 01/30/2010    Past Medical History:  Diagnosis Date  . Anemia    previously followed by Dr. Jamse Arn for anemia and elevated platelets  . Anxiety   . C. difficile colitis 10/01/12   treated by [facility]  . Chronic fatigue syndrome   . DDD (degenerative disc disease), lumbar 08/19/14   and facet arthroplasty & left lumbar radiculopathy (Dr.Ramos)  . Depression   . Dyssynergia    dyssynergenic defecation, contributing to fecal incontinence.  . Edema   . Fibromyalgia   . Gastroparesis    followed at [facility]  . GERD (gastroesophageal reflux disease)   . History of kidney stones   . Hyperlipidemia   . Kidney stone   . Lumbar radiculopathy   . Migraine   . Neuropathy   . Obstructive sleep apnea    Does  wear  CPAP  . Paresthesia    Dr. Everette Rank at [facility]  . Pelvic floor dysfunction    pelvic floor dyssynergy  . Plantar fasciitis 02/2011   R foot  . Pneumonia    2012  . PONV (postoperative nausea and vomiting)    pt states has gastroparesis has difficulty taking antibiotics and narcotics has severe nausea and vomiting   . Restless leg syndrome   . S/P endometrial ablation 08/09/2006   Novasure Ablation  . S/P epidural steroid injection 09/20/14   Dr.Ramos  . Tremor    Dr. Everette Rank  . Urinary frequency   . Urinary incontinence     Family History  Problem Relation Age of Onset  . Allergies Mother   . Hypertension Mother   . Heart disease Mother        possible valve problem - leaking valve  . Macular degeneration Mother   . Heart disease Father        pacemaker, CHF  . Hypertension Father   . Diabetes Father        borderline  . Stroke Father 65  . Kidney disease Father   . Asthma Sister   . Irritable bowel  syndrome Sister   . Allergies Sister   . Heart disease Paternal Grandmother   . Heart disease Paternal Grandfather   . Cancer Maternal Aunt        leukemia  . Cancer Maternal Aunt   . Colon cancer Maternal Aunt        late 38's  . Heart disease Maternal Grandmother   . Heart disease Maternal Grandfather   . CAD Neg Hx    Past Surgical History:  Procedure Laterality Date  . CHOLECYSTECTOMY  9/05  . ENDOMETRIAL ABLATION  08/09/2006   Dr. Valentina Shaggy Ablation  . FACET JOINT INJECTION  04/17/2017   Left L4-5 and L5-S1  . GASTRIC ROUX-EN-Y N/A 09/29/2019   Procedure: LAPAROSCOPIC ROUX-EN-Y GASTRIC BYPASS WITH UPPER ENDOSCOPY, ERAS Pathway;  Surgeon: Johnathan Hausen, MD;  Location: [facility];  Service: General;  Laterality: N/A;  . KNEE ARTHROPLASTY    . KNEE SURGERY  1999   R knee, Dr. Eddie Dibbles, torn cartilage  . RETINAL LASER PROCEDURE Right  08/28/2018   laser retinopexy  . RIGHT/LEFT HEART CATH AND CORONARY ANGIOGRAPHY N/A 01/01/2018   Procedure: RIGHT/LEFT HEART CATH AND CORONARY ANGIOGRAPHY;  Surgeon: Sherren Mocha, MD;  Location: [place] [facility];  Service: Cardiovascular;  Laterality: N/A;  . TONSILLECTOMY  1968  . TONSILLECTOMY    . TOTAL KNEE ARTHROPLASTY Right 09/05/2015   Procedure: RIGHT TOTAL KNEE ARTHROPLASTY;  Surgeon: Gaynelle Arabian, MD;  Location: [facility];  Service: Orthopedics;  Laterality: Right;  . TOTAL KNEE ARTHROPLASTY Left 07/14/2018   Procedure: LEFT TOTAL KNEE ARTHROPLASTY;  Surgeon: Gaynelle Arabian, MD;  Location: [facility];  Service: Orthopedics;  Laterality: Left;  . ULTRASOUND GUIDANCE FOR VASCULAR ACCESS  01/01/2018   Procedure: Ultrasound Guidance For Vascular Access;  Surgeon: Sherren Mocha, MD;  Location: [place] [facility];  Service: Cardiovascular;;  . UMBILICAL HERNIA REPAIR N/A 09/29/2019   Procedure: HERNIA REPAIR UMBILICAL ADULT;  Surgeon: Johnathan Hausen, MD;  Location: [facility];  Service: General;  Laterality: N/A;   Social History   Social History  Narrative   Married, 1 dog. 1 son in [place] (grandson born 06/2017), 1 stepson in [place], with 2 children   Immunization History  Administered Date(s) Administered  . Influenza Inj Mdck Quad Pf 09/13/2019  . Influenza Split 09/17/2011, 09/03/2012, 09/04/2013  . Influenza,inj,Quad PF,6+ Mos 08/16/2014, 07/27/2015, 07/31/2017, 08/13/2018, 07/28/2020  . Influenza-Unspecified 10/04/2016, 07/31/2017, 09/23/2017  . PFIZER(Purple Top)SARS-COV-2 Vaccination 02/20/2020, 03/12/2020, 09/20/2020  . Tdap 01/16/2011, 06/20/2017  . Zoster Recombinat (Shingrix) 02/05/2019, 07/14/2019     Objective: Vital Signs: BP 116/74 (BP Location: Left Arm, Patient Position: Sitting, Cuff Size: Normal)   Pulse 68   Resp 15   Ht 5' 2.5" (1.588 m)   Wt 177 lb 3.2 oz (80.4 kg)   LMP 07/27/2006   BMI 31.89 kg/m    Physical Exam Vitals and nursing note reviewed.  Constitutional:      Appearance: She is well-developed and well-nourished.  HENT:     Head: Normocephalic and atraumatic.  Eyes:     Extraocular Movements: EOM normal.     Conjunctiva/sclera: Conjunctivae normal.  Cardiovascular:     Rate and Rhythm: Normal rate and regular rhythm.     Pulses: Intact distal pulses.     Heart sounds: Normal heart sounds.  Pulmonary:     Effort: Pulmonary effort is normal.     Breath sounds: Normal breath sounds.  Abdominal:     General: Bowel sounds are normal.     Palpations: Abdomen is soft.  Musculoskeletal:     Cervical back: Normal range of motion.  Lymphadenopathy:     Cervical: No cervical adenopathy.  Skin:    General: Skin is warm and dry.     Capillary Refill: Capillary refill takes less than 2 seconds.  Neurological:     Mental Status: She is alert and oriented to person, place, and time.  Psychiatric:        Mood and Affect: Mood and affect normal.        Behavior: Behavior normal.      Musculoskeletal Exam: C-spine was in good range of motion.  She had spasm over the right trapezius area.   Shoulder joints, elbow joints, wrist joints with good range of motion.  She had bilateral PIP and DIP thickening.  Hip joints and knee joints with good range of motion.  There was no tenderness over ankles or MTPs.  She had few tender points present.  CDAI Exam: CDAI Score: -- Patient Global: --; Provider  Global: -- Swollen: --; Tender: -- Joint Exam 01/02/2021   No joint exam has been documented for this visit   There is currently no information documented on the homunculus. Go to the Rheumatology activity and complete the homunculus joint exam.  Investigation: No additional findings.  Imaging: No results found.  Recent Labs: Lab Results  Component Value Date   WBC 7.7 11/07/2020   HGB 12.7 11/07/2020   PLT 282 11/07/2020   NA 144 11/07/2020   K 4.6 11/07/2020   CL 104 11/07/2020   CO2 25 11/07/2020   GLUCOSE 98 11/07/2020   BUN 22 11/07/2020   CREATININE 0.88 11/07/2020   BILITOT 0.3 11/07/2020   ALKPHOS 119 11/07/2020   AST 40 11/07/2020   ALT 46 (H) 11/07/2020   PROT 6.1 11/07/2020   ALBUMIN 3.7 (L) 11/07/2020   CALCIUM 9.2 11/07/2020   GFRAA 81 11/07/2020    Speciality Comments: No specialty comments available.  Procedures:  Trigger Point Inj  Date/Time: 01/02/2021 11:34 AM Performed by: Bo Merino, MD Authorized by: Bo Merino, MD   Consent Given by:  Patient Site marked: the procedure site was marked   Timeout: prior to procedure the correct patient, procedure, and site was verified   Indications:  Muscle spasm and pain Total # of Trigger Points:  1 Location: neck   Needle Size:  27 G Approach:  Dorsal Medications #1:  0.5 mL lidocaine 1 %; 10 mg triamcinolone acetonide 40 MG/ML Patient tolerance:  Patient tolerated the procedure well with no immediate complications   Allergies: Erythromycin, Tramadol, and Dilaudid [hydromorphone hcl]   Assessment / Plan:     Visit Diagnoses: Idiopathic chronic gout of multiple sites without tophus -  allopurinol 300 mg 1 tablet by mouth daily and colchicine 0.6 mg 1 tablet by mouth as needed. uric acid: 07/04/2020 4.4 -she has not had any gout flare.  She takes allopurinol 300 mg p.o. daily.  She takes colchicine only on as needed basis.  I will check uric acid with her next labs in May 2022.  Plan: Uric acid  Medication monitoring encounter - Plan: CBC with Differential/Platelet, COMPLETE METABOLIC PANEL WITH GFR.  Her labs have been stable.  Her LFTs are mildly elevated.  Fibromyalgia -she continues to have some generalized muscle pain and discomfort.  She takes Robaxin on as needed basis.   Other fatigue -she is on Nuvigil for fatigue.  Trapezius muscle spasm-he had right trapezius spasm.  Per her request after informed consent was obtained and side effects were reviewed right trapezius area was injected with cortisone as described above.  She tolerated the procedure well.  Postprocedure instructions were given.  She has been advised to do stretching exercises.  Primary osteoarthritis of both hands-clinical findings are consistent with osteoarthritis.  She does not have much discomfort in her hands.  Primary osteoarthritis of both hips-she has chronic mild discomfort.  History of total knee replacement, bilateral - Performed by Dr. Wynelle Link.  Doing well.  DDD (degenerative disc disease), cervical-she continues to have some stiffness in her cervical region.  DDD (degenerative disc disease), lumbar - She states she had recent fall and vertebral fractures for which she was followed by Dr. Maxie Better.  Other medical problems are listed as follows:  History of depression  History of sleep apnea  History of migraine  History of vertebral fracture  Osteopenia of multiple sites -DEXA findings were reviewed with the patient.  07/13/2020 DXA T-score -2.0 R femoral neck.  Use of calcium  and vitamin D was discussed.  Her DEXA is followed by her GYN.  Need for resistive exercises was discussed.  She  is fully vaccinated against COVID-19.  Use of mask, social distancing and hand hygiene was discussed.  Orders: Orders Placed This Encounter  Procedures  . Trigger Point Inj  . CBC with Differential/Platelet  . COMPLETE METABOLIC PANEL WITH GFR  . Uric acid   No orders of the defined types were placed in this encounter.    Follow-Up Instructions: Return in 6 months (on 07/02/2021) for Osteoarthritis, Gout.   Bo Merino, MD  Note - This record has been created using Editor, commissioning.  Chart creation errors have been sought, but may not always  have been located. Such creation errors do not reflect on  the standard of medical care.

## 2020-12-27 ENCOUNTER — Telehealth: Payer: Self-pay | Admitting: Skilled Nursing Facility1

## 2020-12-27 NOTE — Telephone Encounter (Signed)
Returned pts call  Pt states she is passing gas stating it does not seem to be related to how she eats. Pt states she is not experincing any abdominal pain, diarrhea, or constipation. Pt states it is embarrassing do it at [place].  Dietitian advised due to the lack of pain or cramping there really is not much of a concern here and flatulence can be the mark of a healthy gut. You can try to ensure you are cooking your vegetables well and chewing until appeslacue consistency and eliminate protein shakes in the morning and see if that helps. Dietitian advised her to speak with her physician about her sphincter muscle because it sounds more like as we age we lose that muscle strength.   Pt states she is a picky eater and avoids most vegetables. Pt offered diabetes food hub website for different recipes.

## 2021-01-02 ENCOUNTER — Encounter: Payer: Self-pay | Admitting: Rheumatology

## 2021-01-02 ENCOUNTER — Ambulatory Visit: Payer: Medicare Other | Admitting: Rheumatology

## 2021-01-02 ENCOUNTER — Other Ambulatory Visit: Payer: Self-pay

## 2021-01-02 VITALS — BP 116/74 | HR 68 | Resp 15 | Ht 62.5 in | Wt 177.2 lb

## 2021-01-02 DIAGNOSIS — Z8781 Personal history of (healed) traumatic fracture: Secondary | ICD-10-CM

## 2021-01-02 DIAGNOSIS — M5030 Other cervical disc degeneration, unspecified cervical region: Secondary | ICD-10-CM | POA: Diagnosis not present

## 2021-01-02 DIAGNOSIS — M19041 Primary osteoarthritis, right hand: Secondary | ICD-10-CM

## 2021-01-02 DIAGNOSIS — R5383 Other fatigue: Secondary | ICD-10-CM

## 2021-01-02 DIAGNOSIS — M5136 Other intervertebral disc degeneration, lumbar region: Secondary | ICD-10-CM | POA: Diagnosis not present

## 2021-01-02 DIAGNOSIS — M797 Fibromyalgia: Secondary | ICD-10-CM

## 2021-01-02 DIAGNOSIS — M19042 Primary osteoarthritis, left hand: Secondary | ICD-10-CM

## 2021-01-02 DIAGNOSIS — M62838 Other muscle spasm: Secondary | ICD-10-CM

## 2021-01-02 DIAGNOSIS — Z8669 Personal history of other diseases of the nervous system and sense organs: Secondary | ICD-10-CM

## 2021-01-02 DIAGNOSIS — Z8659 Personal history of other mental and behavioral disorders: Secondary | ICD-10-CM

## 2021-01-02 DIAGNOSIS — Z96653 Presence of artificial knee joint, bilateral: Secondary | ICD-10-CM | POA: Diagnosis not present

## 2021-01-02 DIAGNOSIS — Z5181 Encounter for therapeutic drug level monitoring: Secondary | ICD-10-CM | POA: Diagnosis not present

## 2021-01-02 DIAGNOSIS — M1A09X0 Idiopathic chronic gout, multiple sites, without tophus (tophi): Secondary | ICD-10-CM

## 2021-01-02 DIAGNOSIS — M160 Bilateral primary osteoarthritis of hip: Secondary | ICD-10-CM

## 2021-01-02 DIAGNOSIS — M8589 Other specified disorders of bone density and structure, multiple sites: Secondary | ICD-10-CM

## 2021-01-02 MED ORDER — TRIAMCINOLONE ACETONIDE 40 MG/ML IJ SUSP
10.0000 mg | INTRAMUSCULAR | Status: AC | PRN
  Administered 2021-01-02: 10 mg via INTRAMUSCULAR

## 2021-01-02 MED ORDER — LIDOCAINE HCL 1 % IJ SOLN
0.5000 mL | INTRAMUSCULAR | Status: AC | PRN
  Administered 2021-01-02: .5 mL

## 2021-01-02 NOTE — Patient Instructions (Signed)
Please return in May for your labs which will include CBC with differential, CMP with GFR and uric acid.

## 2021-01-13 DIAGNOSIS — G4733 Obstructive sleep apnea (adult) (pediatric): Secondary | ICD-10-CM | POA: Diagnosis not present

## 2021-01-21 ENCOUNTER — Other Ambulatory Visit: Payer: Self-pay

## 2021-01-21 ENCOUNTER — Observation Stay (HOSPITAL_COMMUNITY)
Admission: EM | Admit: 2021-01-21 | Discharge: 2021-01-22 | Disposition: A | Discharge status: Home / Self Care | Payer: Medicare Other | Attending: Internal Medicine | Admitting: Internal Medicine

## 2021-01-21 DIAGNOSIS — G629 Polyneuropathy, unspecified: Secondary | ICD-10-CM

## 2021-01-21 DIAGNOSIS — R079 Chest pain, unspecified: Secondary | ICD-10-CM | POA: Diagnosis not present

## 2021-01-21 DIAGNOSIS — R404 Transient alteration of awareness: Secondary | ICD-10-CM | POA: Diagnosis not present

## 2021-01-21 DIAGNOSIS — R531 Weakness: Principal | ICD-10-CM

## 2021-01-21 DIAGNOSIS — Z9884 Bariatric surgery status: Secondary | ICD-10-CM | POA: Insufficient documentation

## 2021-01-21 DIAGNOSIS — Z881 Allergy status to other antibiotic agents status: Secondary | ICD-10-CM | POA: Diagnosis not present

## 2021-01-21 DIAGNOSIS — I110 Hypertensive heart disease with heart failure: Secondary | ICD-10-CM | POA: Insufficient documentation

## 2021-01-21 DIAGNOSIS — Z79899 Other long term (current) drug therapy: Secondary | ICD-10-CM | POA: Diagnosis not present

## 2021-01-21 DIAGNOSIS — R479 Unspecified speech disturbances: Secondary | ICD-10-CM | POA: Diagnosis not present

## 2021-01-21 DIAGNOSIS — Z743 Need for continuous supervision: Secondary | ICD-10-CM | POA: Diagnosis not present

## 2021-01-21 DIAGNOSIS — R0789 Other chest pain: Secondary | ICD-10-CM | POA: Diagnosis not present

## 2021-01-21 DIAGNOSIS — Z7984 Long term (current) use of oral hypoglycemic drugs: Secondary | ICD-10-CM | POA: Diagnosis not present

## 2021-01-21 DIAGNOSIS — F419 Anxiety disorder, unspecified: Secondary | ICD-10-CM | POA: Diagnosis not present

## 2021-01-21 DIAGNOSIS — E782 Mixed hyperlipidemia: Secondary | ICD-10-CM | POA: Insufficient documentation

## 2021-01-21 DIAGNOSIS — R200 Anesthesia of skin: Secondary | ICD-10-CM | POA: Diagnosis not present

## 2021-01-21 DIAGNOSIS — I639 Cerebral infarction, unspecified: Secondary | ICD-10-CM | POA: Diagnosis not present

## 2021-01-21 DIAGNOSIS — M109 Gout, unspecified: Secondary | ICD-10-CM | POA: Diagnosis not present

## 2021-01-21 DIAGNOSIS — I5032 Chronic diastolic (congestive) heart failure: Secondary | ICD-10-CM | POA: Insufficient documentation

## 2021-01-21 DIAGNOSIS — K219 Gastro-esophageal reflux disease without esophagitis: Secondary | ICD-10-CM | POA: Insufficient documentation

## 2021-01-21 DIAGNOSIS — F32A Depression, unspecified: Secondary | ICD-10-CM | POA: Insufficient documentation

## 2021-01-21 DIAGNOSIS — Z8669 Personal history of other diseases of the nervous system and sense organs: Secondary | ICD-10-CM | POA: Diagnosis not present

## 2021-01-21 DIAGNOSIS — R2689 Other abnormalities of gait and mobility: Secondary | ICD-10-CM

## 2021-01-21 DIAGNOSIS — G8191 Hemiplegia, unspecified affecting right dominant side: Secondary | ICD-10-CM | POA: Insufficient documentation

## 2021-01-21 DIAGNOSIS — G4733 Obstructive sleep apnea (adult) (pediatric): Secondary | ICD-10-CM | POA: Insufficient documentation

## 2021-01-21 DIAGNOSIS — M4802 Spinal stenosis, cervical region: Secondary | ICD-10-CM | POA: Insufficient documentation

## 2021-01-21 DIAGNOSIS — Z20822 Contact with and (suspected) exposure to covid-19: Secondary | ICD-10-CM | POA: Diagnosis not present

## 2021-01-21 DIAGNOSIS — R4781 Slurred speech: Secondary | ICD-10-CM

## 2021-01-21 DIAGNOSIS — R519 Headache, unspecified: Secondary | ICD-10-CM | POA: Diagnosis not present

## 2021-01-21 DIAGNOSIS — R29818 Other symptoms and signs involving the nervous system: Secondary | ICD-10-CM | POA: Diagnosis not present

## 2021-01-21 DIAGNOSIS — R4701 Aphasia: Secondary | ICD-10-CM | POA: Diagnosis not present

## 2021-01-21 DIAGNOSIS — G4489 Other headache syndrome: Secondary | ICD-10-CM | POA: Diagnosis not present

## 2021-01-21 DIAGNOSIS — R29810 Facial weakness: Secondary | ICD-10-CM | POA: Diagnosis not present

## 2021-01-21 DIAGNOSIS — Z888 Allergy status to other drugs, medicaments and biological substances status: Secondary | ICD-10-CM | POA: Insufficient documentation

## 2021-01-21 DIAGNOSIS — Z885 Allergy status to narcotic agent status: Secondary | ICD-10-CM | POA: Insufficient documentation

## 2021-01-21 DIAGNOSIS — I10 Essential (primary) hypertension: Secondary | ICD-10-CM | POA: Diagnosis present

## 2021-01-21 DIAGNOSIS — R29898 Other symptoms and signs involving the musculoskeletal system: Secondary | ICD-10-CM | POA: Insufficient documentation

## 2021-01-21 MED ORDER — SODIUM CHLORIDE 0.9% FLUSH
3.0000 mL | Freq: Once | INTRAVENOUS | Status: AC
Start: 2021-01-22 — End: 2021-01-22
  Administered 2021-01-22: 3 mL via INTRAVENOUS

## 2021-01-21 NOTE — ED Triage Notes (Signed)
Pt BIB [facility] from home, LKW 2230 tonight. Pt felt right arm weakness and heaviness, and her husband noted she was slow to respond with speech. During transport, EMS noted garbled speech.

## 2021-01-22 ENCOUNTER — Emergency Department (HOSPITAL_COMMUNITY): Payer: Medicare Other

## 2021-01-22 ENCOUNTER — Other Ambulatory Visit (HOSPITAL_COMMUNITY): Payer: Medicare Other

## 2021-01-22 ENCOUNTER — Observation Stay (HOSPITAL_COMMUNITY): Payer: Medicare Other

## 2021-01-22 ENCOUNTER — Encounter (HOSPITAL_COMMUNITY): Payer: Self-pay | Admitting: Emergency Medicine

## 2021-01-22 DIAGNOSIS — R479 Unspecified speech disturbances: Secondary | ICD-10-CM | POA: Insufficient documentation

## 2021-01-22 DIAGNOSIS — R200 Anesthesia of skin: Secondary | ICD-10-CM | POA: Diagnosis not present

## 2021-01-22 DIAGNOSIS — Z8669 Personal history of other diseases of the nervous system and sense organs: Secondary | ICD-10-CM | POA: Diagnosis not present

## 2021-01-22 DIAGNOSIS — I10 Essential (primary) hypertension: Secondary | ICD-10-CM

## 2021-01-22 DIAGNOSIS — R531 Weakness: Secondary | ICD-10-CM | POA: Diagnosis not present

## 2021-01-22 DIAGNOSIS — E782 Mixed hyperlipidemia: Secondary | ICD-10-CM | POA: Diagnosis not present

## 2021-01-22 DIAGNOSIS — R079 Chest pain, unspecified: Secondary | ICD-10-CM | POA: Diagnosis not present

## 2021-01-22 DIAGNOSIS — K219 Gastro-esophageal reflux disease without esophagitis: Secondary | ICD-10-CM

## 2021-01-22 DIAGNOSIS — G629 Polyneuropathy, unspecified: Secondary | ICD-10-CM | POA: Diagnosis not present

## 2021-01-22 DIAGNOSIS — R29818 Other symptoms and signs involving the nervous system: Secondary | ICD-10-CM | POA: Diagnosis not present

## 2021-01-22 DIAGNOSIS — I639 Cerebral infarction, unspecified: Secondary | ICD-10-CM | POA: Diagnosis not present

## 2021-01-22 DIAGNOSIS — M4802 Spinal stenosis, cervical region: Secondary | ICD-10-CM | POA: Diagnosis not present

## 2021-01-22 LAB — COMPREHENSIVE METABOLIC PANEL
ALT: 67 U/L — ABNORMAL HIGH (ref 0–44)
ALT: 77 U/L — ABNORMAL HIGH (ref 0–44)
AST: 49 U/L — ABNORMAL HIGH (ref 15–41)
AST: 56 U/L — ABNORMAL HIGH (ref 15–41)
Albumin: 2.9 g/dL — ABNORMAL LOW (ref 3.5–5.0)
Albumin: 3.3 g/dL — ABNORMAL LOW (ref 3.5–5.0)
Alkaline Phosphatase: 90 U/L (ref 38–126)
Alkaline Phosphatase: 92 U/L (ref 38–126)
Anion gap: 11 (ref 5–15)
Anion gap: 9 (ref 5–15)
BUN: 22 mg/dL (ref 8–23)
BUN: 24 mg/dL — ABNORMAL HIGH (ref 8–23)
CO2: 26 mmol/L (ref 22–32)
CO2: 28 mmol/L (ref 22–32)
Calcium: 8.8 mg/dL — ABNORMAL LOW (ref 8.9–10.3)
Calcium: 8.9 mg/dL (ref 8.9–10.3)
Chloride: 100 mmol/L (ref 98–111)
Chloride: 102 mmol/L (ref 98–111)
Creatinine, Ser: 0.9 mg/dL (ref 0.44–1.00)
Creatinine, Ser: 0.93 mg/dL (ref 0.44–1.00)
GFR, Estimated: >60 mL/min (ref >60)
GFR, Estimated: >60 mL/min (ref >60)
Glucose, Bld: 87 mg/dL (ref 70–99)
Glucose, Bld: 95 mg/dL (ref 70–99)
Potassium: 4.1 mmol/L (ref 3.5–5.1)
Potassium: 4.2 mmol/L (ref 3.5–5.1)
Sodium: 137 mmol/L (ref 135–145)
Sodium: 139 mmol/L (ref 135–145)
Total Bilirubin: 0.6 mg/dL (ref 0.3–1.2)
Total Bilirubin: 0.8 mg/dL (ref 0.3–1.2)
Total Protein: 5.6 g/dL — ABNORMAL LOW (ref 6.5–8.1)
Total Protein: 5.9 g/dL — ABNORMAL LOW (ref 6.5–8.1)

## 2021-01-22 LAB — TROPONIN I (HIGH SENSITIVITY): Troponin I (High Sensitivity): 3 ng/L (ref <18)

## 2021-01-22 LAB — URINALYSIS, COMPLETE (UACMP) WITH MICROSCOPIC
Bilirubin Urine: NEGATIVE
Glucose, UA: NEGATIVE mg/dL
Hgb urine dipstick: NEGATIVE
Ketones, ur: NEGATIVE mg/dL
Nitrite: NEGATIVE
Protein, ur: NEGATIVE mg/dL
Specific Gravity, Urine: 1.014 (ref 1.005–1.030)
pH: 6 (ref 5.0–8.0)

## 2021-01-22 LAB — CBC
HCT: 39.7 % (ref 36.0–46.0)
Hemoglobin: 12.7 g/dL (ref 12.0–15.0)
MCH: 30.3 pg (ref 26.0–34.0)
MCHC: 32 g/dL (ref 30.0–36.0)
MCV: 94.7 fL (ref 80.0–100.0)
Platelets: 272 10*3/uL (ref 150–400)
RBC: 4.19 MIL/uL (ref 3.87–5.11)
RDW: 13.8 % (ref 11.5–15.5)
WBC: 8.9 10*3/uL (ref 4.0–10.5)
nRBC: 0 % (ref 0.0–0.2)

## 2021-01-22 LAB — RAPID URINE DRUG SCREEN, HOSP PERFORMED
Amphetamines: NOT DETECTED
Barbiturates: NOT DETECTED
Benzodiazepines: POSITIVE — AB
Cocaine: NOT DETECTED
Opiates: NOT DETECTED
Tetrahydrocannabinol: NOT DETECTED

## 2021-01-22 LAB — DIFFERENTIAL
Abs Immature Granulocytes: 0.03 10*3/uL (ref 0.00–0.07)
Basophils Absolute: 0.1 10*3/uL (ref 0.0–0.1)
Basophils Relative: 1 %
Eosinophils Absolute: 0.2 10*3/uL (ref 0.0–0.5)
Eosinophils Relative: 2 %
Immature Granulocytes: 0 %
Lymphocytes Relative: 38 %
Lymphs Abs: 3.4 10*3/uL (ref 0.7–4.0)
Monocytes Absolute: 0.9 10*3/uL (ref 0.1–1.0)
Monocytes Relative: 10 %
Neutro Abs: 4.3 10*3/uL (ref 1.7–7.7)
Neutrophils Relative %: 49 %

## 2021-01-22 LAB — I-STAT CHEM 8, ED
BUN: 25 mg/dL — ABNORMAL HIGH (ref 8–23)
Calcium, Ion: 1.14 mmol/L — ABNORMAL LOW (ref 1.15–1.40)
Chloride: 101 mmol/L (ref 98–111)
Creatinine, Ser: 0.9 mg/dL (ref 0.44–1.00)
Glucose, Bld: 94 mg/dL (ref 70–99)
HCT: 37 % (ref 36.0–46.0)
Hemoglobin: 12.6 g/dL (ref 12.0–15.0)
Potassium: 4.1 mmol/L (ref 3.5–5.1)
Sodium: 138 mmol/L (ref 135–145)
TCO2: 28 mmol/L (ref 22–32)

## 2021-01-22 LAB — PROTIME-INR
INR: 1 (ref 0.8–1.2)
Prothrombin Time: 13.2 seconds (ref 11.4–15.2)

## 2021-01-22 LAB — FOLATE: Folate: 35.5 ng/mL (ref >5.9)

## 2021-01-22 LAB — HEMOGLOBIN A1C
Hgb A1c MFr Bld: 6 % — ABNORMAL HIGH (ref 4.8–5.6)
Mean Plasma Glucose: 125.5 mg/dL

## 2021-01-22 LAB — LIPID PANEL
Cholesterol: 118 mg/dL (ref 0–200)
HDL: 45 mg/dL (ref >40)
LDL Cholesterol: 54 mg/dL (ref 0–99)
Total CHOL/HDL Ratio: 2.6 RATIO
Triglycerides: 97 mg/dL (ref <150)
VLDL: 19 mg/dL (ref 0–40)

## 2021-01-22 LAB — AMMONIA: Ammonia: 21 umol/L (ref 9–35)

## 2021-01-22 LAB — TSH: TSH: 2.406 u[IU]/mL (ref 0.350–4.500)

## 2021-01-22 LAB — MAGNESIUM: Magnesium: 2.1 mg/dL (ref 1.7–2.4)

## 2021-01-22 LAB — SARS CORONAVIRUS 2 (TAT 6-24 HRS): SARS Coronavirus 2: NEGATIVE

## 2021-01-22 LAB — VITAMIN B12: Vitamin B-12: 1244 pg/mL — ABNORMAL HIGH (ref 180–914)

## 2021-01-22 LAB — APTT: aPTT: 32 seconds (ref 24–36)

## 2021-01-22 LAB — HIV ANTIBODY (ROUTINE TESTING W REFLEX): HIV Screen 4th Generation wRfx: NONREACTIVE

## 2021-01-22 LAB — CBG MONITORING, ED: Glucose-Capillary: 106 mg/dL — ABNORMAL HIGH (ref 70–99)

## 2021-01-22 LAB — SEDIMENTATION RATE: Sed Rate: 15 mm/hr (ref 0–22)

## 2021-01-22 MED ORDER — ACETAMINOPHEN 650 MG RE SUPP: 650.0000 mg | RECTAL | Status: DC | PRN

## 2021-01-22 MED ORDER — ACETAMINOPHEN 160 MG/5ML PO SOLN: 650.0000 mg | ORAL | Status: DC | PRN

## 2021-01-22 MED ORDER — ACETAMINOPHEN 325 MG PO TABS
650.0000 mg | ORAL_TABLET | Freq: Once | ORAL | Status: AC
  Administered 2021-01-22: 650 mg via ORAL
  Filled 2021-01-22: qty 2

## 2021-01-22 MED ORDER — COLCHICINE 0.6 MG PO TABS: 0.6000 mg | ORAL_TABLET | Freq: Every day | ORAL | Status: DC | PRN

## 2021-01-22 MED ORDER — ONDANSETRON HCL 4 MG/2ML IJ SOLN: 4.0000 mg | Freq: Four times a day (QID) | INTRAMUSCULAR | Status: DC | PRN

## 2021-01-22 MED ORDER — ENOXAPARIN SODIUM 40 MG/0.4ML ~~LOC~~ SOLN
40.0000 mg | Freq: Every day | SUBCUTANEOUS | Status: DC
  Administered 2021-01-22: 40 mg via SUBCUTANEOUS
  Filled 2021-01-22: qty 0.4

## 2021-01-22 MED ORDER — MELATONIN 5 MG PO TABS: 10.0000 mg | ORAL_TABLET | Freq: Every evening | ORAL | Status: DC | PRN

## 2021-01-22 MED ORDER — ACETAMINOPHEN 325 MG PO TABS: 650.0000 mg | ORAL_TABLET | ORAL | Status: DC | PRN

## 2021-01-22 MED ORDER — GABAPENTIN 300 MG PO CAPS
300.0000 mg | ORAL_CAPSULE | Freq: Three times a day (TID) | ORAL | Status: DC
  Administered 2021-01-22: 300 mg via ORAL
  Filled 2021-01-22: qty 1

## 2021-01-22 MED ORDER — SODIUM CHLORIDE 0.9 % IV BOLUS
500.0000 mL | Freq: Once | INTRAVENOUS | Status: AC
  Administered 2021-01-22: 500 mL via INTRAVENOUS

## 2021-01-22 MED ORDER — FLUTICASONE PROPIONATE 50 MCG/ACT NA SUSP
2.0000 | Freq: Every day | NASAL | Status: DC
  Administered 2021-01-22: 2 via NASAL
  Filled 2021-01-22: qty 16

## 2021-01-22 MED ORDER — DIPHENHYDRAMINE HCL 50 MG/ML IJ SOLN
12.5000 mg | Freq: Once | INTRAMUSCULAR | Status: AC
  Administered 2021-01-22: 12.5 mg via INTRAVENOUS
  Filled 2021-01-22: qty 1

## 2021-01-22 MED ORDER — ALPRAZOLAM 0.25 MG PO TABS: 0.2500 mg | ORAL_TABLET | Freq: Three times a day (TID) | ORAL | Status: DC | PRN

## 2021-01-22 MED ORDER — CYCLOSPORINE 0.05 % OP EMUL
1.0000 [drp] | Freq: Two times a day (BID) | OPHTHALMIC | Status: DC
  Administered 2021-01-22: 1 [drp] via OPHTHALMIC
  Filled 2021-01-22 (×2): qty 1

## 2021-01-22 MED ORDER — OXCARBAZEPINE 150 MG PO TABS
150.0000 mg | ORAL_TABLET | Freq: Two times a day (BID) | ORAL | Status: DC
  Administered 2021-01-22: 150 mg via ORAL
  Filled 2021-01-22 (×2): qty 1

## 2021-01-22 MED ORDER — MAGNESIUM SULFATE 2 GM/50ML IV SOLN
2.0000 g | Freq: Once | INTRAVENOUS | Status: AC
  Administered 2021-01-22: 2 g via INTRAVENOUS
  Filled 2021-01-22: qty 50

## 2021-01-22 MED ORDER — ALLOPURINOL 300 MG PO TABS
300.0000 mg | ORAL_TABLET | Freq: Every day | ORAL | Status: DC
  Administered 2021-01-22: 300 mg via ORAL
  Filled 2021-01-22: qty 3
  Filled 2021-01-22: qty 1

## 2021-01-22 MED ORDER — NORTRIPTYLINE HCL 25 MG PO CAPS: 150.0000 mg | ORAL_CAPSULE | Freq: Every day | ORAL | Status: DC

## 2021-01-22 MED ORDER — DULOXETINE HCL 60 MG PO CPEP
60.0000 mg | ORAL_CAPSULE | Freq: Two times a day (BID) | ORAL | Status: DC
  Administered 2021-01-22: 60 mg via ORAL
  Filled 2021-01-22: qty 1

## 2021-01-22 MED ORDER — LACTATED RINGERS IV BOLUS
500.0000 mL | Freq: Once | INTRAVENOUS | Status: AC
  Administered 2021-01-22: 500 mL via INTRAVENOUS

## 2021-01-22 MED ORDER — ATORVASTATIN CALCIUM 40 MG PO TABS
40.0000 mg | ORAL_TABLET | Freq: Every day | ORAL | Status: DC
  Administered 2021-01-22: 40 mg via ORAL
  Filled 2021-01-22: qty 1

## 2021-01-22 MED ORDER — POLYVINYL ALCOHOL 1.4 % OP SOLN
1.0000 [drp] | Freq: Every day | OPHTHALMIC | Status: DC
  Filled 2021-01-22: qty 15

## 2021-01-22 MED ORDER — LORAZEPAM 2 MG/ML IJ SOLN: 1.0000 mg | Freq: Once | INTRAMUSCULAR | Status: DC | PRN

## 2021-01-22 MED ORDER — LORATADINE 10 MG PO TABS: 10.0000 mg | ORAL_TABLET | Freq: Every day | ORAL | Status: DC

## 2021-01-22 MED ORDER — STROKE: EARLY STAGES OF RECOVERY BOOK
Freq: Once | Status: AC
  Administered 2021-01-22: 1
  Filled 2021-01-22: qty 1

## 2021-01-22 MED ORDER — ASPIRIN 81 MG PO CHEW
324.0000 mg | CHEWABLE_TABLET | Freq: Once | ORAL | Status: AC
  Administered 2021-01-22: 324 mg via ORAL
  Filled 2021-01-22: qty 4

## 2021-01-22 MED ORDER — MODAFINIL 100 MG PO TABS
200.0000 mg | ORAL_TABLET | Freq: Every day | ORAL | Status: DC
  Administered 2021-01-22: 200 mg via ORAL
  Filled 2021-01-22: qty 2

## 2021-01-22 MED ORDER — ARIPIPRAZOLE 5 MG PO TABS
5.0000 mg | ORAL_TABLET | Freq: Every day | ORAL | Status: DC
  Administered 2021-01-22: 5 mg via ORAL
  Filled 2021-01-22: qty 1

## 2021-01-22 MED ORDER — HYDRALAZINE HCL 20 MG/ML IJ SOLN: 10.0000 mg | Freq: Four times a day (QID) | INTRAMUSCULAR | Status: DC | PRN

## 2021-01-22 MED ORDER — MELATONIN 5 MG PO TABS: 5.0000 mg | ORAL_TABLET | Freq: Every day | ORAL | Status: DC

## 2021-01-22 MED ORDER — POLYETHYLENE GLYCOL 3350 17 G PO PACK: 17.0000 g | PACK | Freq: Every day | ORAL | Status: DC | PRN

## 2021-01-22 MED ORDER — PANTOPRAZOLE SODIUM 40 MG PO TBEC
40.0000 mg | DELAYED_RELEASE_TABLET | Freq: Every day | ORAL | Status: DC
  Administered 2021-01-22: 40 mg via ORAL
  Filled 2021-01-22: qty 1

## 2021-01-22 MED ORDER — ARMODAFINIL 250 MG PO TABS: 125.0000 mg | ORAL_TABLET | Freq: Every day | ORAL | Status: DC

## 2021-01-22 NOTE — ED Provider Notes (Signed)
[facility] EMERGENCY DEPARTMENT Provider Note   CSN: [id number] Arrival date & time: 01/21/21  2350  An emergency department physician performed an initial assessment on this suspected stroke patient at 2351.  History Chief Complaint  Patient presents with  . Code Stroke   Level 5 caveat due to acuity of condition [name] is a 65 y.o. female.  The history is provided by the patient and the spouse.  Weakness Severity:  Moderate Onset quality:  Sudden Timing:  Constant Progression:  Improving Chronicity:  New Relieved by:  None tried Worsened by:  Nothing Patient presents as a code stroke.  Last known well at approximately 2230 on February 26 Patient reported right-sided weakness and difficulty speaking.      Past Medical History:  Diagnosis Date  . Anemia    previously followed by Dr. Jamse Arn for anemia and elevated platelets  . Anxiety   . C. difficile colitis 10/01/12   treated by [facility]  . Chronic fatigue syndrome   . DDD (degenerative disc disease), lumbar 08/19/14   and facet arthroplasty & left lumbar radiculopathy (Dr.Ramos)  . Depression   . Dyssynergia    dyssynergenic defecation, contributing to fecal incontinence.  . Edema   . Fibromyalgia   . Gastroparesis    followed at [facility]  . GERD (gastroesophageal reflux disease)   . History of kidney stones   . Hyperlipidemia   . Kidney stone   . Lumbar radiculopathy   . Migraine   . Neuropathy   . Obstructive sleep apnea    Does  wear  CPAP  . Paresthesia    Dr. Everette Rank at [facility]  . Pelvic floor dysfunction    pelvic floor dyssynergy  . Plantar fasciitis 02/2011   R foot  . Pneumonia    2012  . PONV (postoperative nausea and vomiting)    pt states has gastroparesis has difficulty taking antibiotics and narcotics has severe nausea and vomiting   . Restless leg syndrome   . S/P endometrial ablation 08/09/2006   Novasure Ablation  . S/P epidural steroid injection  09/20/14   Dr.Ramos  . Tremor    Dr. Everette Rank  . Urinary frequency   . Urinary incontinence     Patient Active Problem List   Diagnosis Date Noted  . Difficulty with speech   . Gastric bypass status for obesity 09/29/2019  . Aftercare 08/12/2018  . Pain in left knee 06/03/2018  . Dyspnea 12/26/2017  . Restrictive lung disease secondary to obesity 12/26/2017  . Atypical chest pain 12/06/2017  . Sinus tachycardia 12/06/2017  . Chest pain 12/06/2017  . DJD (degenerative joint disease), cervical 12/24/2016  . Primary osteoarthritis of both hips 12/24/2016  . Primary osteoarthritis of both knees 12/24/2016  . H/O total knee replacement, right 12/24/2016  . Spondylosis of lumbar region without myelopathy or radiculopathy 12/24/2016  . Lumbosacral spondylosis without myelopathy 12/24/2016  . Acute gout 05/30/2016  . Gout 05/30/2016  . Myalgia 03/29/2016  . Other long term (current) drug therapy 03/29/2016  . Idiopathic peripheral neuropathy 03/29/2016  . Cannot sleep 03/29/2016  . Migraine without aura and responsive to treatment 03/29/2016  . Multifocal myoclonus 03/29/2016  . Restless leg 03/29/2016  . Has a tremor 03/29/2016  . History of aspiration pneumonitis 01/25/2016  . History of acute bronchitis 01/25/2016  . LPRD (laryngopharyngeal reflux disease) 01/25/2016  . Imbalance 01/09/2016  . Serotonin syndrome 12/22/2015  . Hypertensive disorder 09/10/2015  . OA (osteoarthritis) of knee  09/05/2015  . Obesity 05/17/2015  . OSA (obstructive sleep apnea) 03/16/2013  . Iron deficiency 11/06/2012  . Thrombocythemia 11/06/2012  . Leukocytosis 11/05/2012  . Impaired fasting glucose 07/23/2012  . Bronchitis 04/03/2012  . Kidney stone on left side 03/05/2012  . Kidney cysts 03/04/2012  . Loose stools 03/04/2012  . Urinary frequency 12/04/2011  . Restless leg syndrome 12/04/2011  . Polypharmacy 12/04/2011  . Bladder incontinence 12/04/2011  . Fibromyalgia   . Hyperlipidemia    . Edema   . S/P endometrial ablation   . Allergic rhinitis 09/18/2011  . Depression, major, single episode, in partial remission ([place]) 04/21/2011  . PRECORDIAL PAIN 02/17/2010  . Anxiety state 01/30/2010  . Migraine 01/30/2010  . GERD 01/30/2010  . Gastroparesis 01/30/2010    Past Surgical History:  Procedure Laterality Date  . CHOLECYSTECTOMY  9/05  . ENDOMETRIAL ABLATION  08/09/2006   Dr. Valentina Shaggy Ablation  . FACET JOINT INJECTION  04/17/2017   Left L4-5 and L5-S1  . GASTRIC ROUX-EN-Y N/A 09/29/2019   Procedure: LAPAROSCOPIC ROUX-EN-Y GASTRIC BYPASS WITH UPPER ENDOSCOPY, ERAS Pathway;  Surgeon: Johnathan Hausen, MD;  Location: [facility];  Service: General;  Laterality: N/A;  . KNEE ARTHROPLASTY    . KNEE SURGERY  1999   R knee, Dr. Eddie Dibbles, torn cartilage  . RETINAL LASER PROCEDURE Right 08/28/2018   laser retinopexy  . RIGHT/LEFT HEART CATH AND CORONARY ANGIOGRAPHY N/A 01/01/2018   Procedure: RIGHT/LEFT HEART CATH AND CORONARY ANGIOGRAPHY;  Surgeon: Sherren Mocha, MD;  Location: [facility];  Service: Cardiovascular;  Laterality: N/A;  . TONSILLECTOMY  1968  . TONSILLECTOMY    . TOTAL KNEE ARTHROPLASTY Right 09/05/2015   Procedure: RIGHT TOTAL KNEE ARTHROPLASTY;  Surgeon: Gaynelle Arabian, MD;  Location: [facility];  Service: Orthopedics;  Laterality: Right;  . TOTAL KNEE ARTHROPLASTY Left 07/14/2018   Procedure: LEFT TOTAL KNEE ARTHROPLASTY;  Surgeon: Gaynelle Arabian, MD;  Location: [facility];  Service: Orthopedics;  Laterality: Left;  . ULTRASOUND GUIDANCE FOR VASCULAR ACCESS  01/01/2018   Procedure: Ultrasound Guidance For Vascular Access;  Surgeon: Sherren Mocha, MD;  Location: [facility];  Service: Cardiovascular;;  . UMBILICAL HERNIA REPAIR N/A 09/29/2019   Procedure: HERNIA REPAIR UMBILICAL ADULT;  Surgeon: Johnathan Hausen, MD;  Location: [facility];  Service: General;  Laterality: N/A;     OB History    Gravida  1   Para  1   Term  1   Preterm      AB       Living  1     SAB      IAB      Ectopic      Multiple      Live Births              Family History  Problem Relation Age of Onset  . Allergies Mother   . Hypertension Mother   . Heart disease Mother        possible valve problem - leaking valve  . Macular degeneration Mother   . Heart disease Father        pacemaker, CHF  . Hypertension Father   . Diabetes Father        borderline  . Stroke Father 56  . Kidney disease Father   . Asthma Sister   . Irritable bowel syndrome Sister   . Allergies Sister   . Heart disease Paternal Grandmother   . Heart disease Paternal Grandfather   . Cancer Maternal Aunt  leukemia  . Cancer Maternal Aunt   . Colon cancer Maternal Aunt        late 62's  . Heart disease Maternal Grandmother   . Heart disease Maternal Grandfather   . CAD Neg Hx     Social History   Tobacco Use  . Smoking status: Never Smoker  . Smokeless tobacco: Never Used  Vaping Use  . Vaping Use: Never used  Substance Use Topics  . Alcohol use: No    Alcohol/week: 0.0 standard drinks  . Drug use: No    Home Medications Prior to Admission medications   Medication Sig Start Date End Date Taking? Authorizing Provider  acetaminophen (TYLENOL) 500 MG tablet Take 1,000 mg by mouth 3 (three) times daily as needed for moderate pain.     [provider]  allopurinol (ZYLOPRIM) 300 MG tablet TAKE 1 TABLET BY MOUTH EVERY DAY 11/30/20   Rita Ohara, MD  ALPRAZolam Duanne Moron) 0.25 MG tablet Take 1 tablet (0.25 mg total) by mouth 3 (three) times daily as needed for anxiety. 11/08/20   Mozingo, Berdie Ogren, NP  ARIPiprazole (ABILIFY) 5 MG tablet Take 1 tablet (5 mg total) by mouth daily. 11/08/20   Mozingo, Berdie Ogren, NP  Armodafinil 250 MG tablet TAKE 1 TABLET BY MOUTH EVERY DAY WITH BREAKFAST 12/06/20   Bo Merino, MD  atorvastatin (LIPITOR) 40 MG tablet Take 1 tablet (40 mg total) by mouth daily. 05/03/20   Rita Ohara, MD  CALCIUM PO Take  500 mg by mouth in the morning, at noon, and at bedtime.    [provider]  cetirizine (ZYRTEC) 10 MG tablet Take 10 mg by mouth at bedtime.    [provider]  colchicine 0.6 MG tablet TAKE 1 TABLET BY MOUTH DAILY AS NEEDED DURING GOUT FLARES. 09/28/19   Bo Merino, MD  DULoxetine (CYMBALTA) 60 MG capsule Take 1 capsule (60 mg total) by mouth 2 (two) times daily. 11/08/20   Mozingo, Berdie Ogren, NP  fluticasone (FLONASE) 50 MCG/ACT nasal spray PLACE 2 SPRAYS INTO BOTH NOSTRILS DAILY AS NEEDED FOR ALLERGIES 11/14/20   Rita Ohara, MD  gabapentin (NEURONTIN) 300 MG capsule Take 300 mg by mouth 3 (three) times daily.    [provider]  meclizine (ANTIVERT) 25 MG tablet Take 1 tablet (25 mg total) by mouth 3 (three) times daily as needed for dizziness. 07/22/19   Rita Ohara, MD  Melatonin 10 MG TABS Take 5 mg by mouth at bedtime.     [provider]  methocarbamol (ROBAXIN) 500 MG tablet TAKE 1 TABLET BY MOUTH DAILY AS NEEDED 11/30/20   Ofilia Neas, PA-C  metoprolol tartrate (LOPRESSOR) 50 MG tablet TAKE 1 TABLET BY MOUTH TWICE A DAY 12/19/20   Rita Ohara, MD  Multiple Vitamins-Minerals (BARIATRIC MULTIVITAMINS/IRON PO) Take 2 each by mouth daily.    [provider]  Multiple Vitamins-Minerals (ICAPS AREDS 2) CAPS Take 1 capsule by mouth 2 (two) times daily.    [provider]  nortriptyline (PAMELOR) 50 MG capsule Take 150 mg by mouth at bedtime.    [provider]  ondansetron (ZOFRAN) 8 MG tablet Take 8 mg by mouth every 8 (eight) hours as needed for nausea. 07/16/18   [provider]  OXcarbazepine (TRILEPTAL) 150 MG tablet Take 150 mg by mouth 2 (two) times daily. 10/24/20   [provider]  pantoprazole (PROTONIX) 40 MG tablet Take 1 tablet (40 mg total) by mouth daily. 10/01/19   Johnathan Hausen, MD  Probiotic Product ([place]) CAPS Take 1 capsule by mouth at bedtime.     [provider]  prochlorperazine (COMPAZINE) 10 MG tablet Take 10 mg by mouth as needed. 10/27/20   [provider]  promethazine (PHENERGAN) 25 MG tablet Take 25 mg by mouth as needed. 10/27/20   [provider]  Propylene Glycol 0.6 % SOLN Place 1 drop into both eyes daily at 12 noon.     [provider]  RESTASIS 0.05 % ophthalmic emulsion 1 drop 2 (two) times daily. 12/10/19   [provider]    Allergies    Erythromycin, Tramadol, and Dilaudid [hydromorphone hcl]  Review of Systems   Review of Systems  Unable to perform ROS: Acuity of condition  Neurological: Positive for weakness.    Physical Exam Updated Vital Signs BP (!) 116/55 (BP Location: Left Arm) Comment: Simultaneous filing. User may not have seen previous data.  Pulse 63   Resp 16   Ht 1.575 m (5\' 2" )   Wt 81 kg   LMP 07/27/2006   SpO2 99%   BMI 32.66 kg/m   Physical Exam CONSTITUTIONAL: Well developed/well nourished HEAD: Normocephalic/atraumatic EYES: EOMI ENMT: Mucous membranes moist NECK: supple no meningeal signs CV: S1/S2 noted, no murmurs/rubs/gallops noted LUNGS: Lungs are clear to auscultation bilaterally, no apparent distress ABDOMEN: soft, nontender, no rebound or guarding, bowel sounds noted throughout abdomen GU:no cva tenderness NEURO: Pt is awake/alert/appropriate, moves all extremitiesx4.  No facial droop.  Patient is slow to respond to questioning.  See neurology note for extensive neurologic evaluation EXTREMITIES: pulses normal/equal, full ROM, no deformity SKIN: warm, color normal PSYCH: Unable to assess  ED Results / Procedures / Treatments   Labs (all labs ordered are listed, but only abnormal results are displayed) Labs Reviewed  COMPREHENSIVE METABOLIC PANEL - Abnormal; Notable for the following components:      Result Value   BUN 24 (*)    Total Protein 5.9 (*)    Albumin 3.3 (*)    AST 56 (*)    ALT 77 (*)    All other components within normal limits   I-STAT CHEM 8, ED - Abnormal; Notable for the following components:   BUN 25 (*)    Calcium, Ion 1.14 (*)    All other components within normal limits  CBG MONITORING, ED - Abnormal; Notable for the following components:   Glucose-Capillary 106 (*)    All other components within normal limits  SARS CORONAVIRUS 2 (TAT 6-24 HRS)  PROTIME-INR  APTT  CBC  DIFFERENTIAL  AMMONIA  MAGNESIUM  TROPONIN I (HIGH SENSITIVITY)    EKG EKG Interpretation  Date/Time:  Sunday January 22 2021 00:48:38 EST Ventricular Rate:  63 PR Interval:    QRS Duration: 123 QT Interval:  436 QTC Calculation: 447 R Axis:   -64 Text Interpretation: Sinus rhythm Prolonged PR interval RBBB and LAFB Confirmed by Ripley Fraise [phone]) on 01/22/2021 1:02:01 AM   Radiology MR ANGIO HEAD WO CONTRAST  Result Date: 01/22/2021 CLINICAL DATA:  Acute presentation with right arm weakness and speech disturbance. EXAM: MRA HEAD WITHOUT CONTRAST TECHNIQUE: Angiographic images of the Circle of Willis were obtained using MRA technique without intravenous contrast. COMPARISON:  MRI brain same day FINDINGS: The study suffers from some motion degradation. Both internal carotid arteries are widely patent into the brain. No siphon stenosis. The anterior and middle cerebral vessels are patent without proximal stenosis, aneurysm or vascular malformation. Both vertebral arteries are widely patent  at the foramen magnum. Small right vertebral terminates in PICA. Left vertebral artery supplies the basilar. No basilar stenosis. Posterior circulation branch vessels appear normal. IMPRESSION: Normal intracranial MR angiography.  Some motion degradation. Electronically Signed   By: Nelson Chimes M.D.   On: 01/22/2021 00:53   MR BRAIN WO CONTRAST  Result Date: 01/22/2021 CLINICAL DATA:  Acute stroke presentation. Right arm weakness and numbness. Speech disturbance. EXAM: MRI HEAD WITHOUT CONTRAST TECHNIQUE: Multiplanar, multiecho pulse  sequences of the brain and surrounding structures were obtained without intravenous contrast. COMPARISON:  None. FINDINGS: Brain: The brain has a normal appearance without evidence of malformation, atrophy, old or acute small or large vessel infarction, mass lesion, hemorrhage, hydrocephalus or extra-axial collection. Vascular: Major vessels at the base of the brain show flow. Venous sinuses appear patent. Skull and upper cervical spine: Normal. Sinuses/Orbits: Clear/normal. Other: None significant. IMPRESSION: Normal examination. Electronically Signed   By: Nelson Chimes M.D.   On: 01/22/2021 00:45   DG Chest Port 1 View  Result Date: 01/22/2021 CLINICAL DATA:  Chest pain EXAM: PORTABLE CHEST 1 VIEW COMPARISON:  05/02/2018 FINDINGS: Lungs volumes are small, but are symmetric and are clear. No pneumothorax or pleural effusion. Cardiac size within normal limits. Pulmonary vascularity is normal. Osseous structures are age-appropriate. No acute bone abnormality. IMPRESSION: No active disease. Electronically Signed   By: Fidela Salisbury MD   On: 01/22/2021 01:47   CT HEAD CODE STROKE WO CONTRAST  Result Date: 01/22/2021 CLINICAL DATA:  Code stroke. Neurological deficit. Acute stroke suspected. EXAM: CT HEAD WITHOUT CONTRAST TECHNIQUE: Contiguous axial images were obtained from the base of the skull through the vertex without intravenous contrast. COMPARISON:  MRI 09/19/2020 FINDINGS: Brain: The brain has normal appearance without evidence of old or acute infarction, mass lesion, hemorrhage, hydrocephalus or extra-axial collection. Vascular: There is atherosclerotic calcification of the major vessels at the base of the brain. No sign of acute hyperdense vessel. Skull: Normal Sinuses/Orbits: Clear/normal Other: None ASPECTS ([place] Stroke Program Early CT Score) - Ganglionic level infarction (caudate, lentiform nuclei, internal capsule, insula, M1-M3 cortex): 7 - Supraganglionic infarction (M4-M6 cortex): 3 Total  score (0-10 with 10 being normal): 10 IMPRESSION: 1. Normal head CT for age. 2. ASPECTS is 10. 3. These results were called by telephone at the time of interpretation on 01/22/2021 at 12:08 am to provider Ripley Fraise , who verbally acknowledged these results. Electronically Signed   By: Nelson Chimes M.D.   On: 01/22/2021 00:09    Procedures Procedures   Medications Ordered in ED Medications  LORazepam (ATIVAN) injection 1 mg (has no administration in time range)  sodium chloride flush (NS) 0.9 % injection 3 mL (3 mLs Intravenous Given 01/22/21 0053)  aspirin chewable tablet 324 mg (324 mg Oral Given 01/22/21 0203)  acetaminophen (TYLENOL) tablet 650 mg (650 mg Oral Given 01/22/21 0203)  lactated ringers bolus 500 mL (500 mLs Intravenous New Bag/Given 01/22/21 0229)    ED Course  I have reviewed the triage vital signs and the nursing notes.  Pertinent labs & imaging results that were available during my care of the patient were reviewed by me and considered in my medical decision making (see chart for details).    MDM Rules/Calculators/A&P                          1:49 AM Patient presented as a code stroke.  Patient went to initial CT and MR imaging prior to my  evaluation. I discussed with neurology, and has not felt that this represents an acute stroke.  Patient has a negative MRI.  Patient is now reporting chest tightness.  She is awake alert on my exam.  Will perform cardiac evaluation, but did have negative cardiac cath in 2018 EKG is unchanged. Chest x-ray is reviewed.  Labs are pending this time.  Neurology Dr Curly Sh[place] is now added on a MRI C-spine as this patient did have brisker reflexes on the right on her exam 2:42 AM Patient reporting continued chest tightness, also reporting feeling lightheaded.  Blood pressure is dropped below 100.  IV fluids were ordered. Due to issues including chest tightness, as well as continued neurologic symptoms, patient will be admitted to the  hospital for further evaluation/monitoring.  Discussed with Dr. Cyd Silence for admission Final Clinical Impression(s) / ED Diagnoses Final diagnoses:  Slurred speech  Chest pain, rule out acute myocardial infarction    Rx / DC Orders ED Discharge Orders    None       Ripley Fraise, MD 01/22/21 [phone]

## 2021-01-22 NOTE — Evaluation (Signed)
Occupational Therapy Evaluation Patient Details Name: [name] MRN: [id number] DOB: 08-02-1956 Today's Date: 01/22/2021    History of Present Illness Pt is a 65 y/o female who presents with headache, R sided weakness and slurred speech. MRI negative for acute changes. PMH significant for chronic fatigue syndrome, fibromyalgia, DDD, migraine, RLS, B TKR, gastric bypass 09/29/2019.   Clinical Impression   Pt admitted with the above diagnoses and presents with below problem list. Pt will benefit from continued acute OT to address the below listed deficits and maximize independence with basic ADLs prior to d/c home. At baseline, pt is independent with ADLs. Pt reports feeling "off" this session but struggled to elaborate in further detail. Pt is currently mod I with OOB ADLs and mobility d/t being currently guarded with her movements. Plan to follow acutely but do not feel she will have any further OT needs after d/c.      Follow Up Recommendations  No OT follow up    Equipment Recommendations  None recommended by OT    Recommendations for Other Services       Precautions / Restrictions Precautions Precautions: None Precaution Comments: Low fall risk Restrictions Weight Bearing Restrictions: No      Mobility Bed Mobility Overal bed mobility: Independent             General bed mobility comments: Pt was received in the bathroom with NT present.    Transfers Overall transfer level: Modified independent Equipment used: None             General transfer comment: to/from EOB. Extra time and guarded movements    Balance Overall balance assessment: Needs assistance Sitting-balance support: Feet supported;No upper extremity supported Sitting balance-Leahy Scale: Fair     Standing balance support: No upper extremity supported;Single extremity supported;During functional activity Standing balance-Leahy Scale: Fair Standing balance comment: stood at sink to complete  oral care. No LOB  or overt unsteadiness noted.                           ADL either performed or assessed with clinical judgement   ADL Overall ADL's : Modified independent                                       General ADL Comments: a bit guarded with mobility and during OOB activity.     Vision         Perception     Praxis      Pertinent Vitals/Pain Pain Assessment: No/denies pain     Hand Dominance     Extremity/Trunk Assessment Upper Extremity Assessment Upper Extremity Assessment: RUE deficits/detail;Overall [facility] for tasks assessed RUE Deficits / Details: Pt reports h/o RUE tremor. Not observed this session   Lower Extremity Assessment Lower Extremity Assessment: Defer to PT evaluation   Cervical / Trunk Assessment Cervical / Trunk Assessment: Normal   Communication Communication Communication: No difficulties   Cognition Arousal/Alertness: Awake/alert Behavior During Therapy: Flat affect;WFL for tasks assessed/performed Overall Cognitive Status: Impaired/Different from baseline Area of Impairment: Problem solving                             Problem Solving: Slow processing General Comments: Slow processing. Pt reports feeling "off" struggles to descibe in further detail.   General Comments  Exercises     Shoulder Instructions      Home Living Family/patient expects to be discharged to:: Private residence Living Arrangements: Spouse/significant other Available Help at Discharge: Family;Available 24 hours/day Type of Home: House Home Access: Stairs to enter [employer] of Steps: 1 very small step up to enter   Home Layout: Two level;Able to live on main level with bedroom/bathroom Alternate Level Stairs-Number of Steps: flight   Bathroom Shower/Tub: Tub only;Walk-in shower   Bathroom Toilet: Handicapped height     Home Equipment: Environmental consultant - 2 wheels;Bedside commode;Shower seat - built in           Prior Functioning/Environment Level of Independence: Independent        Comments: Retired 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 s/p gastric bypass        OT Problem List: Decreased activity tolerance;Decreased knowledge of precautions;Impaired balance (sitting and/or standing)      OT Treatment/Interventions:      OT Goals(Current goals can be found in the care plan section) Acute Rehab OT Goals Patient Stated Goal: Home today - get back to working out with her trainer 3x/week OT Goal Formulation: With patient Time For Goal Achievement: 02/05/21 Potential to Achieve Goals: Good ADL Goals Pt Will Perform Grooming: Independently;standing Pt Will Perform Tub/Shower Transfer: Independently;ambulating  OT Frequency:     Barriers to D/C:            Co-evaluation              AM-PAC OT "6 Clicks" Daily Activity     Outcome Measure Help from another person eating meals?: None Help from another person taking care of personal grooming?: None Help from another person toileting, which includes using toliet, bedpan, or urinal?: None Help from another person bathing (including washing, rinsing, drying)?: A Little Help from another person to put on and taking off regular upper body clothing?: None Help from another person to put on and taking off regular lower body clothing?: None 6 Click Score: 23   End of Session    Activity Tolerance: Patient tolerated treatment well;Other (comment) (Pt r/o feeling "off") Patient left: in bed;with call bell/phone within reach  OT Visit Diagnosis: Unsteadiness on feet (R26.81);Other symptoms and signs involving cognitive function                Time: 4132-4401 OT Time Calculation (min): 16 min Charges:  OT General Charges $OT Visit: 1 Visit OT Evaluation $OT Eval Low Complexity: [place], OT Acute Rehabilitation Services Pager: [phone] Office: [phone]   [name] 01/22/2021, 12:03  PM

## 2021-01-22 NOTE — ED Notes (Signed)
Patient transported to MRI; pt has no PIV access. Difficult stick.

## 2021-01-22 NOTE — ED Notes (Signed)
Patient transported to CT 

## 2021-01-22 NOTE — ED Notes (Signed)
Attempted report x1. 

## 2021-01-22 NOTE — Consult Note (Signed)
Neurology Consultation Reason for Consult: Code stroke for difficulty speaking and right sided heaviness Requesting Physician: Ripley Fraise  CC: Headache, speech difficulty   History is obtained from: Patient and chart review  HPI: [name] is a 65 y.o. female with a past medical history significant for obesity, hyperlipidemia, migraine headaches, obstructive sleep apnea not on CPAP, restless leg syndrome, tremor, fibromyalgia, anxiety, depression, lumbar disc disease, urinary frequency/incontinence, bariatric surgery (November 2020).  She reports she was in her usual state of health today, and when she was descending the stairs at about 10:30 PM she began to have some difficulty with controlling the right side of her body, different than her normal tremor on the right side.  She also began to have trouble with her speech.  This gradually worsened over the next hour or so, to the point that she decided to call EMS rather than have her husband drive her to the hospital for further evaluation.  On EMS evaluation there was concern for possible facial droop and due to her difficulty speaking she was activated as a code stroke.  In route to the hospital she developed a right-sided headache 5/10 in intensity not aggravated by light/sound and not associated with any nausea which she reports is not her typical migraine headache.  She denies any recent significant headaches or change in her headache pattern in general.  She denies any other focal neurologic signs or symptoms, but does report some chest discomfort which developed after MRI while in the ED.  Of note, she had a similar spell back in November 2021 during her husband's birthday party, except that it was more brief lasting only a few minutes per her report today.  On evaluation by her outpatient neurologist on 11/10/2020 (Dr. Everette Rank), she was recommended to use baby aspirin for 6 months of for potential TIA.  Carotid Dopplers on 09/21/2020  demonstrated less than 40% stenosis bilaterally and MRI brain on 09/19/2020 demonstrated a questionable DWI change in the left limb of the posterior internal capsule which was felt to be artifactual..  Neurological conditions for which she follows with Dr. Everette Rank include tremor, episodic asterixis (suspected due to relative hyperammonemia), transient right hand paresthesias (possible carpal tunnel syndrome), restless leg syndrome (improving with clonazepam), depression, fibromyalgia, insomnia, headache, dizziness.  She was also noted to have fallen twice coming down the steps at home and the week prior to her mid December office visit.  Neurological exam was notable only for some length dependent loss of pinprick in the bilateral lower extremities  LKW: 10:30 PM tPA given?: No, no stroke on MRI Premorbid modified rankin scale: 0-1, No symptoms - No significant disability. Able to carry out all usual activities, despite some symptoms.  ROS: All other review of systems was negative except as noted in the HPI.  Past Medical History:  Diagnosis Date  . Anemia    previously followed by Dr. Jamse Arn for anemia and elevated platelets  . Anxiety   . C. difficile colitis 10/01/12   treated by [facility]  . Chronic fatigue syndrome   . DDD (degenerative disc disease), lumbar 08/19/14   and facet arthroplasty & left lumbar radiculopathy (Dr.Ramos)  . Depression   . Dyssynergia    dyssynergenic defecation, contributing to fecal incontinence.  . Edema   . Fibromyalgia   . Gastroparesis    followed at [facility]  . GERD (gastroesophageal reflux disease)   . History of kidney stones   . Hyperlipidemia   . Kidney stone   .  Lumbar radiculopathy   . Migraine   . Neuropathy   . Obstructive sleep apnea    Does  wear  CPAP  . Paresthesia    Dr. Everette Rank at [facility]  . Pelvic floor dysfunction    pelvic floor dyssynergy  . Plantar fasciitis 02/2011   R foot  . Pneumonia    2012  . PONV (postoperative  nausea and vomiting)    pt states has gastroparesis has difficulty taking antibiotics and narcotics has severe nausea and vomiting   . Restless leg syndrome   . S/P endometrial ablation 08/09/2006   Novasure Ablation  . S/P epidural steroid injection 09/20/14   Dr.Ramos  . Tremor    Dr. Everette Rank  . Urinary frequency   . Urinary incontinence    Past Surgical History:  Procedure Laterality Date  . CHOLECYSTECTOMY  9/05  . ENDOMETRIAL ABLATION  08/09/2006   Dr. Valentina Shaggy Ablation  . FACET JOINT INJECTION  04/17/2017   Left L4-5 and L5-S1  . GASTRIC ROUX-EN-Y N/A 09/29/2019   Procedure: LAPAROSCOPIC ROUX-EN-Y GASTRIC BYPASS WITH UPPER ENDOSCOPY, ERAS Pathway;  Surgeon: Johnathan Hausen, MD;  Location: [facility];  Service: General;  Laterality: N/A;  . KNEE ARTHROPLASTY    . KNEE SURGERY  1999   R knee, Dr. Eddie Dibbles, torn cartilage  . RETINAL LASER PROCEDURE Right 08/28/2018   laser retinopexy  . RIGHT/LEFT HEART CATH AND CORONARY ANGIOGRAPHY N/A 01/01/2018   Procedure: RIGHT/LEFT HEART CATH AND CORONARY ANGIOGRAPHY;  Surgeon: Sherren Mocha, MD;  Location: [place] [facility];  Service: Cardiovascular;  Laterality: N/A;  . TONSILLECTOMY  1968  . TONSILLECTOMY    . TOTAL KNEE ARTHROPLASTY Right 09/05/2015   Procedure: RIGHT TOTAL KNEE ARTHROPLASTY;  Surgeon: Gaynelle Arabian, MD;  Location: [facility];  Service: Orthopedics;  Laterality: Right;  . TOTAL KNEE ARTHROPLASTY Left 07/14/2018   Procedure: LEFT TOTAL KNEE ARTHROPLASTY;  Surgeon: Gaynelle Arabian, MD;  Location: [facility];  Service: Orthopedics;  Laterality: Left;  . ULTRASOUND GUIDANCE FOR VASCULAR ACCESS  01/01/2018   Procedure: Ultrasound Guidance For Vascular Access;  Surgeon: Sherren Mocha, MD;  Location: [place] [facility];  Service: Cardiovascular;;  . UMBILICAL HERNIA REPAIR N/A 09/29/2019   Procedure: HERNIA REPAIR UMBILICAL ADULT;  Surgeon: Johnathan Hausen, MD;  Location: [facility];  Service: General;  Laterality: N/A;   Current  Outpatient Medications  Medication Instructions  . acetaminophen (TYLENOL) 1,000 mg, Oral, 3 times daily PRN  . allopurinol (ZYLOPRIM) 300 MG tablet TAKE 1 TABLET BY MOUTH EVERY DAY  . ALPRAZolam (XANAX) 0.25 mg, Oral, 3 times daily PRN  . ARIPiprazole (ABILIFY) 5 mg, Oral, Daily  . Armodafinil 250 MG tablet TAKE 1 TABLET BY MOUTH EVERY DAY WITH BREAKFAST  . atorvastatin (LIPITOR) 40 mg, Oral, Daily  . CALCIUM PO 500 mg, Oral, 3 times daily  . cetirizine (ZYRTEC) 10 mg, Oral, Daily at bedtime  . colchicine 0.6 MG tablet TAKE 1 TABLET BY MOUTH DAILY AS NEEDED DURING GOUT FLARES.  . DULoxetine (CYMBALTA) 60 mg, Oral, 2 times daily  . fluticasone (FLONASE) 50 MCG/ACT nasal spray PLACE 2 SPRAYS INTO BOTH NOSTRILS DAILY AS NEEDED FOR ALLERGIES  . gabapentin (NEURONTIN) 300 mg, Oral, 3 times daily  . meclizine (ANTIVERT) 25 mg, Oral, 3 times daily PRN  . Melatonin 5 mg, Oral, Daily at bedtime  . methocarbamol (ROBAXIN) 500 MG tablet TAKE 1 TABLET BY MOUTH DAILY AS NEEDED  . metoprolol tartrate (LOPRESSOR) 50 MG tablet TAKE 1 TABLET BY MOUTH TWICE  A DAY  . Multiple Vitamins-Minerals (BARIATRIC MULTIVITAMINS/IRON PO) 2 each, Oral, Daily  . Multiple Vitamins-Minerals (ICAPS AREDS 2) CAPS 1 capsule, Oral, 2 times daily  . nortriptyline (PAMELOR) 150 mg, Oral, Daily at bedtime  . ondansetron (ZOFRAN) 8 mg, Oral, Every 8 hours PRN  . OXcarbazepine (TRILEPTAL) 150 mg, Oral, 2 times daily  . pantoprazole (PROTONIX) 40 mg, Oral, Daily  . Probiotic Product (PHILLIPS COLON HEALTH) CAPS 1 capsule, Oral, Daily at bedtime  . prochlorperazine (COMPAZINE) 10 mg, Oral, As needed  . promethazine (PHENERGAN) 25 mg, Oral, As needed  . Propylene Glycol 0.6 % SOLN 1 drop, Both Eyes, Daily  . RESTASIS 0.05 % ophthalmic emulsion 1 drop, 2 times daily     Family History  Problem Relation Age of Onset  . Allergies Mother   . Hypertension Mother   . Heart disease Mother        possible valve problem - leaking  valve  . Macular degeneration Mother   . Heart disease Father        pacemaker, CHF  . Hypertension Father   . Diabetes Father        borderline  . Stroke Father 18  . Kidney disease Father   . Asthma Sister   . Irritable bowel syndrome Sister   . Allergies Sister   . Heart disease Paternal Grandmother   . Heart disease Paternal Grandfather   . Cancer Maternal Aunt        leukemia  . Cancer Maternal Aunt   . Colon cancer Maternal Aunt        late 55's  . Heart disease Maternal Grandmother   . Heart disease Maternal Grandfather   . CAD Neg Hx    Social History:  reports that she has never smoked. She has never used smokeless tobacco. She reports that she does not drink alcohol and does not use drugs.   Exam: Current vital signs: Ht 5\' 2"  (1.575 m)   Wt 81 kg   LMP 07/27/2006   BMI 32.66 kg/m  Vital signs in last 24 hours: Weight:  [81 kg] 81 kg (02/26 2352)   Physical Exam  Constitutional: Appears well-developed and well-nourished. Obese Psych: Affect flat and mildly anxious Eyes: No scleral injection HENT: No oropharyngeal obstruction.  MSK: no joint deformities.  Cardiovascular: Normal rate and regular rhythm.  Respiratory: Effort normal, non-labored breathing GI: Soft.  No distension. There is no tenderness.  Skin: Warm dry and intact visible skin  Neuro: Mental Status: Patient is awake, alert, oriented to person, place, month, year, and situation. Patient is able to give a clear and coherent history, albeit slowly Slow speech, intermittent stuttering, minor errors on "no ifs ands or buts" ("no ifs ands and buts") Cranial Nerves: II: Visual Fields are full. Pupils are equal, round, and reactive to light 4->2 mm III,IV, VI: EOMI without ptosis or diploplia.  V: Facial sensation is symmetric to light touch VII: Facial movement is symmetric.  VIII: hearing is intact to voice X: Uvula elevates symmetrically XI: Shoulder shrug is symmetric.  XII: tongue is  midline without atrophy or fasciculations.  Motor: Tone is normal. Bulk is normal. 5/5 strength was present in all four extremities, other than bilateral hip flexor weakness with some component of giveaway weakness, 4/5 Sensory: Sensation is symmetric to light touch and temperature in the arms and legs. Deep Tendon Reflexes: 3+ right brachioradialis and right patella (crossed), 2+ on the left Plantars: Toes are downgoing bilaterally.  Cerebellar: FNF  and HKS are intact bilaterally, within limits of hip flexor weakness in lower extremities, with variable effort on repeat testing (improved on repeat testing)  NIHSS total  Score breakdown: 3 1 point for left leg weakness, 1 point for right leg weakness, 1 point for mild difficulty with repetition on language testing  I have reviewed labs in epic and the results pertinent to this consultation are: B12 09/15/2020 1305  Creatinine 0.9 Glucose 95 BUN 24 (mildly increased from baseline of 22) AST 36, ALT 77, with chart review of labs notable for intermittent elevations Coags within normal limits  I have reviewed the images obtained: Head CT without acute intracranial process MRI brain without acute stroke MRA brain without significant stenosis or vessel occlusion  Impression: This is a 65 year old woman presenting with speech difficulty, right-sided headache, and reported right-sided weakness though she is symmetric on my formal evaluation.  Given MRI obtained while the patient was still symptomatic (and had been symptomatic for over 2 hours) was negative, this does not represent a TIA and is more likely toxic/metabolic though could also be related to a complex migraine.  However her examination is notable for some asymmetry of her reflexes, brisker on the right than the left.  Cervical spine nerve root compression can contribute to headaches, weakness and hyperreflexia so we will additionally obtain an MRI C-spine to rule out any significant  compression, especially given her history of degenerative disc disease and prior falls  Recommendations: -Ammonia -UA/UDS -MRI C-spine -Should her ammonia not be significantly elevated, would treat her headache with 650 mg Tylenol, 2 g magnesium IV, 1 L normal saline, and 12.5 mg Benadryl (avoid NSAIDs given her gastric bypass) -Should the above work-up be negative, no further inpatient neurological work-up is needed and patient may be discharged with outpatient neurological follow-up pending medical clearance by ED -Troponin and EKG to evaluate chest pain, remainder of chest pain work-up per ED  Lesleigh Noe MD-PhD [facility] [phone] Available 7 PM to 7 AM, outside of these hours please call Neurologist on call as listed on Amion.

## 2021-01-22 NOTE — TOC Transition Note (Signed)
Transition of Care [facility]) - CM/SW Discharge Note   Patient Details  Name: [name] MRN: [id number] Date of Birth: 1956-09-26  [facility] CM/SW Contact:  Carles Collet, RN Phone Number: 01/22/2021, 11:08 AM   Clinical Narrative:   Damaris Schooner w patient at bedside. She states that she just walked 3 times around the nurse's station, 1 time completely independently. Deferred HH, was agreeable to OP PT for eval, and was interested in resuming sessions with her personal trainer at the gym. She states she feels much better than when she worked with PT yesterday.  Referral made to [facility].           Patient Goals and CMS Choice        Discharge Placement                       Discharge Plan and Services                                     Social Determinants of Health (SDOH) Interventions     Readmission Risk Interventions No flowsheet data found.

## 2021-01-22 NOTE — Progress Notes (Signed)
Dr. Remi Haggard said the patient could stay another day, but it was up to them.  The patient and her husband decided to go home.  Updated Dr. Remi Haggard.

## 2021-01-22 NOTE — Progress Notes (Signed)
Patient arrived in the unit at 0415 am from [facility] in a stretcher, A&Ox4, denied of pain, no s/s distress, initiated telemetry monitor, resting in a bed, all personal belonging and call belll are within reach, and will continue to monitor closely.

## 2021-01-22 NOTE — Plan of Care (Signed)
  Problem: Education: Goal: Knowledge of disease or condition will improve Outcome: Adequate for Discharge Goal: Knowledge of secondary prevention will improve Outcome: Adequate for Discharge Goal: Knowledge of patient specific risk factors addressed and post discharge goals established will improve Outcome: Adequate for Discharge   Problem: Coping: Goal: Will verbalize positive feelings about self Outcome: Adequate for Discharge Goal: Will identify appropriate support needs Outcome: Adequate for Discharge   Problem: Health Behavior/Discharge Planning: Goal: Ability to manage health-related needs will improve Outcome: Adequate for Discharge   Problem: Self-Care: Goal: Ability to participate in self-care as condition permits will improve Outcome: Adequate for Discharge Goal: Verbalization of feelings and concerns over difficulty with self-care will improve Outcome: Adequate for Discharge Goal: Ability to communicate needs accurately will improve Outcome: Adequate for Discharge   Problem: Nutrition: Goal: Risk of aspiration will decrease Outcome: Adequate for Discharge Goal: Dietary intake will improve Outcome: Adequate for Discharge   Problem: Intracerebral Hemorrhage Tissue Perfusion: Goal: Complications of Intracerebral Hemorrhage will be minimized Outcome: Adequate for Discharge   Problem: Ischemic Stroke/TIA Tissue Perfusion: Goal: Complications of ischemic stroke/TIA will be minimized Outcome: Adequate for Discharge   Problem: Spontaneous Subarachnoid Hemorrhage Tissue Perfusion: Goal: Complications of Spontaneous Subarachnoid Hemorrhage will be minimized Outcome: Adequate for Discharge   

## 2021-01-22 NOTE — Progress Notes (Signed)
I just went into the patient's room to go over her d/c paperwork.  She is lying fully dress in the bed, and is stating that she just does not "feel right".  She said she ambulated in the hall and the room and is now very dizzy.  She said she is not against going home, but something is just not right.  I told her I would reach out to you to discuss.  Thanks.Sander Nephew

## 2021-01-22 NOTE — Evaluation (Signed)
Physical Therapy Evaluation Patient Details Name: [name] MRN: [id number] DOB: Apr 09, 1956 Today's Date: 01/22/2021   History of Present Illness  Pt is a 65 y/o female who presents with headache, R sided weakness and slurred speech. MRI negative for acute changes. PMH significant for chronic fatigue syndrome, fibromyalgia, DDD, migraine, RLS, B TKR, gastric bypass 09/29/2019.  Clinical Impression  Pt admitted with above diagnosis. At the time of PT eval pt was able to preform transfers and ambulation with up to min guard assist for safety. Pt initially with RW but progressed to no AD without any significant unsteadiness or LOB. Pt is likely near baseline of function however would benefit from follow-up therapy to maximize return to PLOF. Pt has been exercising with a trainer 3x/week following her bariatric surgery and has a goal of returning to this. Pt would benefit from a short bout of physical therapy to prepare her for return to her personal trainer. Pt currently with functional limitations due to the deficits listed below (see PT Problem List). Pt will benefit from skilled PT to increase their independence and safety with mobility to allow discharge to the venue listed below.       Follow Up Recommendations Home health PT;Supervision - Intermittent    Equipment Recommendations  None recommended by PT    Recommendations for Other Services       Precautions / Restrictions Precautions Precautions: None Precaution Comments: Low fall risk Restrictions Weight Bearing Restrictions: No      Mobility  Bed Mobility               General bed mobility comments: Pt was received in the bathroom with NT present.    Transfers Overall transfer level: Modified independent Equipment used: Rolling walker (2 wheeled);None             General transfer comment: Pt stood from low toilet without assist however noted use of railing in bathroom. Pt initially with RW use and performed  stand>sit to recliner without AD.  Ambulation/Gait Ambulation/Gait assistance: Min guard;Supervision Gait Distance (Feet): 200 Feet Assistive device: Rolling walker (2 wheeled);None Gait Pattern/deviations: Step-through pattern;Decreased stride length Gait velocity: Decreased Gait velocity interpretation: 1.31 - 2.62 ft/sec, indicative of limited community ambulator General Gait Details: Pt initially with RW for support but quickly progressed to no AD. Supervision to min guard assist provided throughout gait training. Pt appears guarded but without overt LOB.  Stairs            Wheelchair Mobility    Modified Rankin (Stroke Patients Only) Modified Rankin (Stroke Patients Only) Pre-Morbid Rankin Score: No symptoms Modified Rankin: Moderately severe disability     Balance Overall balance assessment: Needs assistance Sitting-balance support: Feet supported;No upper extremity supported Sitting balance-Leahy Scale: Fair     Standing balance support: No upper extremity supported;During functional activity Standing balance-Leahy Scale: Fair Standing balance comment: stood at the sink to wash hands without UE support. good weight shift for dynamic activity.                             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    Lower Extremity Assessment Lower Extremity Assessment: RLE deficits/detail;LLE deficits/detail RLE Deficits / Details: 4+/5 quads, 4/5 hamstrings, 4-/5 hip flexors RLE Sensation: history of peripheral neuropathy LLE Deficits / Details: 4+/5 quads, 4/5 hamstrings, 4-/5 hip flexors LLE Sensation: history of peripheral neuropathy    Cervical / Trunk Assessment Cervical / Trunk Assessment: Normal  Communication   Communication: No difficulties  Cognition Arousal/Alertness: Awake/alert Behavior During Therapy: Flat affect Overall Cognitive Status: Impaired/Different from baseline Area of Impairment: Problem solving                             Problem Solving: Slow processing General Comments: Oriented but overall with slow processing and occasional increased time to respond. Pt reports feeling "off" in her head but cannot describe.      General Comments      Exercises     Assessment/Plan    PT Assessment Patient needs continued PT services  PT Problem List Decreased strength;Decreased activity tolerance;Decreased balance;Decreased mobility;Decreased knowledge of use of DME;Decreased safety awareness;Decreased knowledge of precautions       PT Treatment Interventions DME instruction;Gait training;Functional mobility training;Therapeutic activities;Therapeutic exercise;Neuromuscular re-education;Balance training;Patient/family education    PT Goals (Current goals can be found in the Care Plan section)  Acute Rehab PT Goals Patient Stated Goal: Home today - get back to working out with her trainer 3x/week PT Goal Formulation: With patient Time For Goal Achievement: 01/30/21 Potential to Achieve Goals: Good     Frequency Min 3X/week   Barriers to discharge        Co-evaluation               AM-PAC PT "6 Clicks" Mobility  Outcome Measure Help needed turning from your back to your side while in a flat bed without using bedrails?: None Help needed moving from lying on your back to sitting on the side of a flat bed without using bedrails?: None Help needed moving to and from a bed to a chair (including a wheelchair)?: None Help needed standing up from a chair using your arms (e.g., wheelchair or bedside chair)?: None Help needed to walk in hospital room?: A Little Help needed climbing 3-5 steps with a railing? : A Little 6 Click Score: 22    End of Session Equipment Utilized During Treatment: Gait belt Activity Tolerance: Patient tolerated treatment well Patient left: in chair;with call bell/phone within reach;with chair alarm set (NT request even though low fall risk) Nurse Communication: Mobility status PT Visit Diagnosis: Other symptoms and signs involving the nervous system (R29.898)    Time: 7591-6384 PT Time Calculation (min) (ACUTE ONLY): 32 min   Charges:   PT Evaluation $PT Eval Low Complexity: 1 Low PT Treatments $Gait Training: 8-22 mins        Rolinda Roan, PT, DPT Acute Rehabilitation Services Pager: [phone] Office: [phone]   Thelma Comp 01/22/2021, 9:43 AM

## 2021-01-22 NOTE — Discharge Summary (Signed)
Physician Discharge Summary  [name] CVE:[id number] DOB: 1956/04/27 DOA: 01/21/2021  PCP: Rita Ohara, MD  Admit date: 01/21/2021 Discharge date: 01/22/2021  Admitted From: Home Disposition: Home  Recommendations for Outpatient Follow-up:  1. Follow up with PCP in 1 week with repeat CBC/BMP 2. Outpatient follow-up with neurology 3. Recommend outpatient evaluation and follow-up by psychiatry at earliest convenience 4. Follow up in ED if symptoms worsen or new appear   Home Health: Home health PT Equipment/Devices: None  Discharge Condition: Stable CODE STATUS: Full Diet recommendation: Heart healthy  Brief/Interim Summary: 65 year old female with history of Roux-en-Y gastric bypass surgery, migraine headaches, obstructive sleep apnea, GERD, gout, hyperlipidemia, fibromyalgia, peripheral neuropathy, chronic diastolic heart failure, restless leg syndrome presented with right-sided weakness and slurred speech.  On presentation, CT of the brain was negative for acute abnormality.  Neurology was consulted.  MRI of the brain subsequently also did not reveal any acute abnormality.  She then had MRI of cervical spine which was negative for any acute abnormality.  Her condition is improving.  She is tolerating physical therapy.  Neurology has cleared the patient for discharge.  She will be discharged home with outpatient follow-up with PCP/neurology.  Also recommend outpatient psychiatry evaluation follow-up at earliest convenience as patient is on multiple medications.  Discharge Diagnoses:   Acute right-sided weakness with some slurred speech -Questionable cause.  Unclear if this was TIA versus complicated migraine -CT of the brain and MRI of the brain were negative for acute abnormality.  MRI of cervical spine was also negative for acute abnormality - Her condition is improving.  She is tolerating physical therapy.  Neurology has cleared the patient for discharge.  She will be discharged  home with outpatient follow-up with PCP/neurology.  Also recommend outpatient psychiatry evaluation follow-up at earliest convenience as patient is on multiple medications. -She will need home health PT upon discharge  Hyperlipidemia -Continue statin  Gout -Continue allopurinol and as needed colchicine  Essential hypertension -Outpatient follow-up.  Continue metoprolol  Peripheral polyneuropathy -Continue gabapentin  History of migraine headaches -Outpatient follow-up with PCP/neurology  GERD -Continue PPI next  Anxiety/depression -Continue home regimen.  Patient is on multiple medications.  Recommend outpatient follow-up with her psychiatrist at earliest convenience  Discharge Instructions  Discharge Instructions    Ambulatory referral to Neurology   Complete by: As directed    An appointment is requested in approximately: Hospital follow up   Diet - low sodium heart healthy   Complete by: As directed    Increase activity slowly   Complete by: As directed      Allergies as of 01/22/2021      Reactions   Erythromycin Nausea Only   Abdominal pain   Tramadol Itching   Dilaudid [hydromorphone Hcl] Itching      Medication List    TAKE these medications   acetaminophen 500 MG tablet Commonly known as: TYLENOL Take 1,000 mg by mouth 3 (three) times daily as needed for moderate pain.   allopurinol 300 MG tablet Commonly known as: ZYLOPRIM TAKE 1 TABLET BY MOUTH EVERY DAY   ALPRAZolam 0.25 MG tablet Commonly known as: XANAX Take 1 tablet (0.25 mg total) by mouth 3 (three) times daily as needed for anxiety.   ARIPiprazole 5 MG tablet Commonly known as: ABILIFY Take 1 tablet (5 mg total) by mouth daily.   Armodafinil 250 MG tablet TAKE 1 TABLET BY MOUTH EVERY DAY WITH BREAKFAST   atorvastatin 40 MG tablet Commonly known as: LIPITOR Take  1 tablet (40 mg total) by mouth daily.   CALCIUM PO Take 500 mg by mouth in the morning, at noon, and at bedtime.    cetirizine 10 MG tablet Commonly known as: ZYRTEC Take 10 mg by mouth at bedtime.   colchicine 0.6 MG tablet TAKE 1 TABLET BY MOUTH DAILY AS NEEDED DURING GOUT FLARES.   DULoxetine 60 MG capsule Commonly known as: CYMBALTA Take 1 capsule (60 mg total) by mouth 2 (two) times daily.   fluticasone 50 MCG/ACT nasal spray Commonly known as: FLONASE PLACE 2 SPRAYS INTO BOTH NOSTRILS DAILY AS NEEDED FOR ALLERGIES   gabapentin 300 MG capsule Commonly known as: NEURONTIN Take 300 mg by mouth 3 (three) times daily.   ICaps Areds 2 Caps Take 1 capsule by mouth 2 (two) times daily.   BARIATRIC MULTIVITAMINS/IRON PO Take 2 each by mouth daily.   meclizine 25 MG tablet Commonly known as: ANTIVERT Take 1 tablet (25 mg total) by mouth 3 (three) times daily as needed for dizziness.   Melatonin 10 MG Tabs Take 5 mg by mouth at bedtime.   methocarbamol 500 MG tablet Commonly known as: ROBAXIN TAKE 1 TABLET BY MOUTH DAILY AS NEEDED   metoprolol tartrate 50 MG tablet Commonly known as: LOPRESSOR TAKE 1 TABLET BY MOUTH TWICE A DAY   nortriptyline 50 MG capsule Commonly known as: PAMELOR Take 150 mg by mouth at bedtime.   ondansetron 8 MG tablet Commonly known as: ZOFRAN Take 8 mg by mouth every 8 (eight) hours as needed for nausea.   OXcarbazepine 150 MG tablet Commonly known as: TRILEPTAL Take 150 mg by mouth 2 (two) times daily.   pantoprazole 40 MG tablet Commonly known as: PROTONIX Take 1 tablet (40 mg total) by mouth daily.   [facility] Caps Take 1 capsule by mouth at bedtime.   prochlorperazine 10 MG tablet Commonly known as: COMPAZINE Take 10 mg by mouth as needed.   promethazine 25 MG tablet Commonly known as: PHENERGAN Take 25 mg by mouth as needed.   Propylene Glycol 0.6 % Soln Place 1 drop into both eyes daily at 12 noon.   Restasis 0.05 % ophthalmic emulsion Generic drug: cycloSPORINE 1 drop 2 (two) times daily.       Follow-up  Information    Rita Ohara, MD. Schedule an appointment as soon as possible for a visit in 1 week(s).   Specialty: Family Medicine Contact information: [place] [address] [phone]        Josue Hector, MD .   Specialty: Cardiology Contact information: [phone] [address] [place] [address] [phone]        Sueanne Margarita, MD .   Specialty: Cardiology Contact information: [phone] [address] [place] [address] [phone]        psychiatrist. Schedule an appointment as soon as possible for a visit in 1 week(s).              Allergies  Allergen Reactions  . Erythromycin Nausea Only    Abdominal pain  . Tramadol Itching  . Dilaudid [Hydromorphone Hcl] Itching    Consultations:  Neurology   Procedures/Studies: MR ANGIO HEAD WO CONTRAST  Result Date: 01/22/2021 CLINICAL DATA:  Acute presentation with right arm weakness and speech disturbance. EXAM: MRA HEAD WITHOUT CONTRAST TECHNIQUE: Angiographic images of the Circle of Willis were obtained using MRA technique without intravenous contrast. COMPARISON:  MRI brain same day FINDINGS: The study suffers  from some motion degradation. Both internal carotid arteries are widely patent into the brain. No siphon stenosis. The anterior and middle cerebral vessels are patent without proximal stenosis, aneurysm or vascular malformation. Both vertebral arteries are widely patent at the foramen magnum. Small right vertebral terminates in PICA. Left vertebral artery supplies the basilar. No basilar stenosis. Posterior circulation branch vessels appear normal. IMPRESSION: Normal intracranial MR angiography.  Some motion degradation. Electronically Signed   By: Nelson Chimes M.D.   On: 01/22/2021 00:53   MR BRAIN WO CONTRAST  Result Date: 01/22/2021 CLINICAL DATA:  Acute stroke presentation. Right arm weakness and numbness. Speech disturbance. EXAM: MRI HEAD WITHOUT CONTRAST  TECHNIQUE: Multiplanar, multiecho pulse sequences of the brain and surrounding structures were obtained without intravenous contrast. COMPARISON:  None. FINDINGS: Brain: The brain has a normal appearance without evidence of malformation, atrophy, old or acute small or large vessel infarction, mass lesion, hemorrhage, hydrocephalus or extra-axial collection. Vascular: Major vessels at the base of the brain show flow. Venous sinuses appear patent. Skull and upper cervical spine: Normal. Sinuses/Orbits: Clear/normal. Other: None significant. IMPRESSION: Normal examination. Electronically Signed   By: Nelson Chimes M.D.   On: 01/22/2021 00:45   MR CERVICAL SPINE WO CONTRAST  Result Date: 01/22/2021 CLINICAL DATA:  65 year old female with right arm weakness and numbness. Speech disturbance. Negative brain MRI. EXAM: MRI CERVICAL SPINE WITHOUT CONTRAST TECHNIQUE: Multiplanar, multisequence MR imaging of the cervical spine was performed. No intravenous contrast was administered. COMPARISON:  Brain MRI and head CT earlier today. FINDINGS: Alignment: Straightening of cervical lordosis. No spondylolisthesis. Vertebrae: No marrow edema or evidence of acute osseous abnormality. Visualized bone marrow signal is within normal limits. Cord: Normal. Cervical spinal canal is capacious at most levels. Capacious visible upper thoracic canal. Posterior Fossa, vertebral arteries, paraspinal tissues: Cervicomedullary junction is within normal limits. Negative visible posterior fossa. Dominant left vertebral artery throughout the neck. Preserved major vascular flow voids in the neck. Negative visible neck soft tissues, left lung apex. Disc levels: C2-C3:  Mild facet and ligament flavum hypertrophy.  No stenosis. C3-C4: Minimal central disc bulge or protrusion. Mild facet and ligament flavum hypertrophy. No stenosis. C4-C5:  Mild rightward disc bulging.  No stenosis. C5-C6: Mild disc space loss. Mildly lobulated circumferential disc  osteophyte complex. Broad-based posterior component. Borderline to mild spinal stenosis. No cord mass effect. Mild right C6 foraminal stenosis. C6-C7: Circumferential disc bulge slightly eccentric to the right. Mild endplate spurring. Ligament flavum hypertrophy. Borderline spinal stenosis. No cord mass effect. No foraminal stenosis. C7-T1:  Negative. Negative visible upper thoracic levels. IMPRESSION: 1. Largely normal for age MRI appearance of the cervical spine. 2. Mild degeneration at C5-C6 and C6-C7 with borderline spinal stenosis. No spinal cord mass effect. Mild right C6 foraminal stenosis. Electronically Signed   By: Genevie Ann M.D.   On: 01/22/2021 04:18   DG Chest Port 1 View  Result Date: 01/22/2021 CLINICAL DATA:  Chest pain EXAM: PORTABLE CHEST 1 VIEW COMPARISON:  05/02/2018 FINDINGS: Lungs volumes are small, but are symmetric and are clear. No pneumothorax or pleural effusion. Cardiac size within normal limits. Pulmonary vascularity is normal. Osseous structures are age-appropriate. No acute bone abnormality. IMPRESSION: No active disease. Electronically Signed   By: Fidela Salisbury MD   On: 01/22/2021 01:47   CT HEAD CODE STROKE WO CONTRAST  Result Date: 01/22/2021 CLINICAL DATA:  Code stroke. Neurological deficit. Acute stroke suspected. EXAM: CT HEAD WITHOUT CONTRAST TECHNIQUE: Contiguous axial images were obtained from the base  of the skull through the vertex without intravenous contrast. COMPARISON:  MRI 09/19/2020 FINDINGS: Brain: The brain has normal appearance without evidence of old or acute infarction, mass lesion, hemorrhage, hydrocephalus or extra-axial collection. Vascular: There is atherosclerotic calcification of the major vessels at the base of the brain. No sign of acute hyperdense vessel. Skull: Normal Sinuses/Orbits: Clear/normal Other: None ASPECTS ([place] Stroke Program Early CT Score) - Ganglionic level infarction (caudate, lentiform nuclei, internal capsule, insula, M1-M3  cortex): 7 - Supraganglionic infarction (M4-M6 cortex): 3 Total score (0-10 with 10 being normal): 10 IMPRESSION: 1. Normal head CT for age. 2. ASPECTS is 10. 3. These results were called by telephone at the time of interpretation on 01/22/2021 at 12:08 am to provider Ripley Fraise , who verbally acknowledged these results. Electronically Signed   By: Nelson Chimes M.D.   On: 01/22/2021 00:09       Subjective: Patient seen and examined at bedside.  Feels that her speech is back to baseline.  Weakness is also improving.  She has tolerated physical therapy.  Denies any overnight fever or vomiting.  Discharge Exam: Vitals:   01/22/21 0600 01/22/21 0815  BP: 116/80 119/68  Pulse:  70  Resp:  18  Temp:  98.2 F (36.8 C)  SpO2:  99%    General: Pt is alert, awake, not in acute distress Cardiovascular: rate controlled, S1/S2 + Respiratory: bilateral decreased breath sounds at bases Abdominal: Soft, NT, ND, bowel sounds + Extremities: Trace lower extremity edema; no cyanosis    The results of significant diagnostics from this hospitalization (including imaging, microbiology, ancillary and laboratory) are listed below for reference.     Microbiology: Recent Results (from the past 240 hour(s))  SARS CORONAVIRUS 2 (TAT 6-24 HRS) Nasopharyngeal Nasopharyngeal Swab     Status: None   Collection Time: 01/22/21  2:26 AM   Specimen: Nasopharyngeal Swab  Result Value Ref Range Status   SARS Coronavirus 2 NEGATIVE NEGATIVE Final    Comment: (NOTE) SARS-CoV-2 target nucleic acids are NOT DETECTED.  The SARS-CoV-2 RNA is generally detectable in upper and lower respiratory specimens during the acute phase of infection. Negative results do not preclude SARS-CoV-2 infection, do not rule out co-infections with other pathogens, and should not be used as the sole basis for treatment or other patient management decisions. Negative results must be combined with clinical observations, patient  history, and epidemiological information. The expected result is Negative.  Fact Sheet for Patients: SugarRoll.be  Fact Sheet for Healthcare Providers: https://www.woods-mathews.com/  This test is not yet approved or cleared by the [place] FDA and  has been authorized for detection and/or diagnosis of SARS-CoV-2 by FDA under an Emergency Use Authorization (EUA). This EUA will remain  in effect (meaning this test can be used) for the duration of the COVID-19 declaration under Se ction 564(b)(1) of the Act, 21 U.S.C. section 360bbb-3(b)(1), unless the authorization is terminated or revoked sooner.  Performed at [facility], [place] [address] [place], [place] [address]      Labs: BNP (last 3 results) No results for input(s): BNP in the last 8760 hours. Basic Metabolic Panel: Recent Labs  Lab 01/21/21 2358 01/22/21 0050 01/22/21 0457  NA 137  138  --  139  K 4.1  4.1  --  4.2  CL 102  101  --  100  CO2 26  --  28  GLUCOSE 95  94  --  87  BUN 24*  25*  --  22  CREATININE 0.90  0.90  --  0.93  CALCIUM 8.9  --  8.8*  MG  --  2.1  --    Liver Function Tests: Recent Labs  Lab 01/21/21 2358 01/22/21 0457  AST 56* 49*  ALT 77* 67*  ALKPHOS 92 90  BILITOT 0.6 0.8  PROT 5.9* 5.6*  ALBUMIN 3.3* 2.9*   No results for input(s): LIPASE, AMYLASE in the last 168 hours. Recent Labs  Lab 01/22/21 0050  AMMONIA 21   CBC: Recent Labs  Lab 01/21/21 2358  WBC 8.9  NEUTROABS 4.3  HGB 12.7  12.6  HCT 39.7  37.0  MCV 94.7  PLT 272   Cardiac Enzymes: No results for input(s): CKTOTAL, CKMB, CKMBINDEX, TROPONINI in the last 168 hours. BNP: Invalid input(s): POCBNP CBG: Recent Labs  Lab 01/22/21 0046  GLUCAP 106*   D-Dimer No results for input(s): DDIMER in the last 72 hours. Hgb A1c Recent Labs    01/22/21 0457  HGBA1C 6.0*   Lipid Profile Recent Labs    01/22/21 0457  CHOL 118  HDL 45  LDLCALC  54  TRIG 97  CHOLHDL 2.6   Thyroid function studies Recent Labs    01/22/21 0457  TSH 2.406   Anemia work up Recent Labs    01/22/21 0457  VITAMINB12 1,244*  FOLATE 35.5   Urinalysis    Component Value Date/Time   COLORURINE YELLOW 01/22/2021 0329   APPEARANCEUR CLEAR 01/22/2021 0329   LABSPEC 1.014 01/22/2021 0329   LABSPEC 1.015 07/01/2019 1341   PHURINE 6.0 01/22/2021 0329   GLUCOSEU NEGATIVE 01/22/2021 0329   HGBUR NEGATIVE 01/22/2021 0329   BILIRUBINUR NEGATIVE 01/22/2021 0329   BILIRUBINUR negative 07/01/2019 1341   BILIRUBINUR 1 04/23/2017 0913   BILIRUBINUR Negative 05/02/2011 1412   KETONESUR NEGATIVE 01/22/2021 0329   PROTEINUR NEGATIVE 01/22/2021 0329   UROBILINOGEN negative (A) 04/23/2017 0913   UROBILINOGEN 0.2 08/25/2015 1045   NITRITE NEGATIVE 01/22/2021 0329   LEUKOCYTESUR TRACE (A) 01/22/2021 0329   LEUKOCYTESUR Moderate 05/02/2011 1412   Sepsis Labs Invalid input(s): PROCALCITONIN,  WBC,  LACTICIDVEN Microbiology Recent Results (from the past 240 hour(s))  SARS CORONAVIRUS 2 (TAT 6-24 HRS) Nasopharyngeal Nasopharyngeal Swab     Status: None   Collection Time: 01/22/21  2:26 AM   Specimen: Nasopharyngeal Swab  Result Value Ref Range Status   SARS Coronavirus 2 NEGATIVE NEGATIVE Final    Comment: (NOTE) SARS-CoV-2 target nucleic acids are NOT DETECTED.  The SARS-CoV-2 RNA is generally detectable in upper and lower respiratory specimens during the acute phase of infection. Negative results do not preclude SARS-CoV-2 infection, do not rule out co-infections with other pathogens, and should not be used as the sole basis for treatment or other patient management decisions. Negative results must be combined with clinical observations, patient history, and epidemiological information. The expected result is Negative.  Fact Sheet for Patients: SugarRoll.be  Fact Sheet for Healthcare  Providers: https://www.woods-mathews.com/  This test is not yet approved or cleared by the [place] FDA and  has been authorized for detection and/or diagnosis of SARS-CoV-2 by FDA under an Emergency Use Authorization (EUA). This EUA will remain  in effect (meaning this test can be used) for the duration of the COVID-19 declaration under Se ction 564(b)(1) of the Act, 21 U.S.C. section 360bbb-3(b)(1), unless the authorization is terminated or revoked sooner.  Performed at [facility], [place] [address], [place], [place] [address]      Time coordinating  discharge: 35 minutes  SIGNED:   Aline August, MD  [facility] 01/22/2021, 10:24 AM

## 2021-01-22 NOTE — Progress Notes (Signed)
Patient ID: [name], female   DOB: [birth year], 65 y.o.   MRN: [id number] Patient was admitted early this morning for right-sided weakness and slurred speech with subsequent negative MRI of the brain. Neurology has evaluated the patient. Patient seen and examined at bedside and plan of care discussed with her. I have reviewed patient's medical records including this morning's H&P, vitals, labs and medications myself. MRI of cervical spine negative for any cervical acute abnormality. PT eval.

## 2021-01-22 NOTE — H&P (Signed)
History and Physical    SARH KIRSCHENBAUM SWN:[id number] DOB: 1956/10/21 DOA: 01/21/2021  PCP: Rita Ohara, MD  Patient coming from: Home   Chief Complaint:  Chief Complaint  Patient presents with  . Code Stroke     HPI:    65 year old female status post Roux-en-Y gastric bypass 09/2019 with past medical history of migraine headaches, obstructive sleep apnea, gastroesophageal reflux disease, gout, hyperlipidemia, fibromyalgia, peripheral neuropathy, diastolic congestive heart failure (Echo 11/2017 EF 60-65%), and restless leg syndrome who presents to [facility] emergency department with complaints of right-sided weakness and slurred speech.  Proximately 10:30 PM last night patient states that she was walking on the stairs when she noticed that she was having sudden onset weakness of her right arm and right leg. Patient states that despite this she did not fall. At approximately the same time the patient also began to have difficulty speaking with some slurring of her words and what seems to be occasional difficulty word finding. Shortly after the onset of her symptoms she began to also experience a headache, frontal in location, severe in intensity and radiating towards the right side of the head. Patient denies any visual changes, confusion, fever or neck stiffness.  Patient symptoms persisted over approximately the next hour after which the patient presented to [facility] emergency department via EMS.  Upon initial evaluation patient was managed as a code stroke and promptly evaluated by Dr. Curly Shores with neurology. Patient underwent prompt evaluation with MRI brain which revealed no evidence of acute stroke. Patient remains symptomatic however. Neurology recommended a complete work-up and treatment of possible complicated migraine headache with possible discharge home if symptoms improved. Unfortunately, patient's symptoms of severe headache and weakness are persisting in the  emergency room provider has called the hospitalist group to assess the patient for hospitalization.  Review of Systems:   Review of Systems  Neurological: Positive for speech change, focal weakness and headaches.    Past Medical History:  Diagnosis Date  . Anemia    previously followed by Dr. Jamse Arn for anemia and elevated platelets  . Anxiety   . C. difficile colitis 10/01/12   treated by [facility]  . Chronic fatigue syndrome   . DDD (degenerative disc disease), lumbar 08/19/14   and facet arthroplasty & left lumbar radiculopathy (Dr.Ramos)  . Depression   . Dyssynergia    dyssynergenic defecation, contributing to fecal incontinence.  . Edema   . Fibromyalgia   . Gastroparesis    followed at [facility]  . GERD (gastroesophageal reflux disease)   . History of kidney stones   . Hyperlipidemia   . Kidney stone   . Lumbar radiculopathy   . Migraine   . Neuropathy   . Obstructive sleep apnea    Does  wear  CPAP  . Paresthesia    Dr. Everette Rank at [facility]  . Pelvic floor dysfunction    pelvic floor dyssynergy  . Plantar fasciitis 02/2011   R foot  . Pneumonia    2012  . PONV (postoperative nausea and vomiting)    pt states has gastroparesis has difficulty taking antibiotics and narcotics has severe nausea and vomiting   . Restless leg syndrome   . S/P endometrial ablation 08/09/2006   Novasure Ablation  . S/P epidural steroid injection 09/20/14   Dr.Ramos  . Tremor    Dr. Everette Rank  . Urinary frequency   . Urinary incontinence     Past Surgical History:  Procedure Laterality Date  .  CHOLECYSTECTOMY  9/05  . ENDOMETRIAL ABLATION  08/09/2006   Dr. Valentina Shaggy Ablation  . FACET JOINT INJECTION  04/17/2017   Left L4-5 and L5-S1  . GASTRIC ROUX-EN-Y N/A 09/29/2019   Procedure: LAPAROSCOPIC ROUX-EN-Y GASTRIC BYPASS WITH UPPER ENDOSCOPY, ERAS Pathway;  Surgeon: Johnathan Hausen, MD;  Location: [facility];  Service: General;  Laterality: N/A;  . KNEE ARTHROPLASTY    .  KNEE SURGERY  1999   R knee, Dr. Eddie Dibbles, torn cartilage  . RETINAL LASER PROCEDURE Right 08/28/2018   laser retinopexy  . RIGHT/LEFT HEART CATH AND CORONARY ANGIOGRAPHY N/A 01/01/2018   Procedure: RIGHT/LEFT HEART CATH AND CORONARY ANGIOGRAPHY;  Surgeon: Sherren Mocha, MD;  Location: [place] [facility];  Service: Cardiovascular;  Laterality: N/A;  . TONSILLECTOMY  1968  . TONSILLECTOMY    . TOTAL KNEE ARTHROPLASTY Right 09/05/2015   Procedure: RIGHT TOTAL KNEE ARTHROPLASTY;  Surgeon: Gaynelle Arabian, MD;  Location: [facility];  Service: Orthopedics;  Laterality: Right;  . TOTAL KNEE ARTHROPLASTY Left 07/14/2018   Procedure: LEFT TOTAL KNEE ARTHROPLASTY;  Surgeon: Gaynelle Arabian, MD;  Location: [facility];  Service: Orthopedics;  Laterality: Left;  . ULTRASOUND GUIDANCE FOR VASCULAR ACCESS  01/01/2018   Procedure: Ultrasound Guidance For Vascular Access;  Surgeon: Sherren Mocha, MD;  Location: [place] [facility];  Service: Cardiovascular;;  . UMBILICAL HERNIA REPAIR N/A 09/29/2019   Procedure: HERNIA REPAIR UMBILICAL ADULT;  Surgeon: Johnathan Hausen, MD;  Location: [facility];  Service: General;  Laterality: N/A;     reports that she has never smoked. She has never used smokeless tobacco. She reports that she does not drink alcohol and does not use drugs.  Allergies  Allergen Reactions  . Erythromycin Nausea Only    Abdominal pain  . Tramadol Itching  . Dilaudid [Hydromorphone Hcl] Itching    Family History  Problem Relation Age of Onset  . Allergies Mother   . Hypertension Mother   . Heart disease Mother        possible valve problem - leaking valve  . Macular degeneration Mother   . Heart disease Father        pacemaker, CHF  . Hypertension Father   . Diabetes Father        borderline  . Stroke Father 42  . Kidney disease Father   . Asthma Sister   . Irritable bowel syndrome Sister   . Allergies Sister   . Heart disease Paternal Grandmother   . Heart disease Paternal Grandfather   .  Cancer Maternal Aunt        leukemia  . Cancer Maternal Aunt   . Colon cancer Maternal Aunt        late 64's  . Heart disease Maternal Grandmother   . Heart disease Maternal Grandfather   . CAD Neg Hx      Prior to Admission medications   Medication Sig Start Date End Date Taking? Authorizing Provider  acetaminophen (TYLENOL) 500 MG tablet Take 1,000 mg by mouth 3 (three) times daily as needed for moderate pain.     [provider]  allopurinol (ZYLOPRIM) 300 MG tablet TAKE 1 TABLET BY MOUTH EVERY DAY 11/30/20   Rita Ohara, MD  ALPRAZolam Duanne Moron) 0.25 MG tablet Take 1 tablet (0.25 mg total) by mouth 3 (three) times daily as needed for anxiety. 11/08/20   Mozingo, Berdie Ogren, NP  ARIPiprazole (ABILIFY) 5 MG tablet Take 1 tablet (5 mg total) by mouth daily. 11/08/20   Mozingo, Berdie Ogren, NP  Armodafinil 250 MG tablet TAKE 1 TABLET BY MOUTH EVERY DAY WITH BREAKFAST 12/06/20   Bo Merino, MD  atorvastatin (LIPITOR) 40 MG tablet Take 1 tablet (40 mg total) by mouth daily. 05/03/20   Rita Ohara, MD  CALCIUM PO Take 500 mg by mouth in the morning, at noon, and at bedtime.    [provider]  cetirizine (ZYRTEC) 10 MG tablet Take 10 mg by mouth at bedtime.    [provider]  colchicine 0.6 MG tablet TAKE 1 TABLET BY MOUTH DAILY AS NEEDED DURING GOUT FLARES. 09/28/19   Bo Merino, MD  DULoxetine (CYMBALTA) 60 MG capsule Take 1 capsule (60 mg total) by mouth 2 (two) times daily. 11/08/20   Mozingo, Berdie Ogren, NP  fluticasone (FLONASE) 50 MCG/ACT nasal spray PLACE 2 SPRAYS INTO BOTH NOSTRILS DAILY AS NEEDED FOR ALLERGIES 11/14/20   Rita Ohara, MD  gabapentin (NEURONTIN) 300 MG capsule Take 300 mg by mouth 3 (three) times daily.    [provider]  meclizine (ANTIVERT) 25 MG tablet Take 1 tablet (25 mg total) by mouth 3 (three) times daily as needed for dizziness. 07/22/19   Rita Ohara, MD  Melatonin 10 MG TABS Take 5 mg by mouth at bedtime.      [provider]  methocarbamol (ROBAXIN) 500 MG tablet TAKE 1 TABLET BY MOUTH DAILY AS NEEDED 11/30/20   Ofilia Neas, PA-C  metoprolol tartrate (LOPRESSOR) 50 MG tablet TAKE 1 TABLET BY MOUTH TWICE A DAY 12/19/20   Rita Ohara, MD  Multiple Vitamins-Minerals (BARIATRIC MULTIVITAMINS/IRON PO) Take 2 each by mouth daily.    [provider]  Multiple Vitamins-Minerals (ICAPS AREDS 2) CAPS Take 1 capsule by mouth 2 (two) times daily.    [provider]  nortriptyline (PAMELOR) 50 MG capsule Take 150 mg by mouth at bedtime.    [provider]  ondansetron (ZOFRAN) 8 MG tablet Take 8 mg by mouth every 8 (eight) hours as needed for nausea. 07/16/18   [provider]  OXcarbazepine (TRILEPTAL) 150 MG tablet Take 150 mg by mouth 2 (two) times daily. 10/24/20   [provider]  pantoprazole (PROTONIX) 40 MG tablet Take 1 tablet (40 mg total) by mouth daily. 10/01/19   Johnathan Hausen, MD  Probiotic Product ([place]) CAPS Take 1 capsule by mouth at bedtime.     [provider]  prochlorperazine (COMPAZINE) 10 MG tablet Take 10 mg by mouth as needed. 10/27/20   [provider]  promethazine (PHENERGAN) 25 MG tablet Take 25 mg by mouth as needed. 10/27/20   [provider]  Propylene Glycol 0.6 % SOLN Place 1 drop into both eyes daily at 12 noon.     [provider]  RESTASIS 0.05 % ophthalmic emulsion 1 drop 2 (two) times daily. 12/10/19   [provider]    Physical Exam: Vitals:   01/22/21 0319 01/22/21 0325 01/22/21 0329 01/22/21 0330  BP:    114/63  Pulse: (!) 55 (!) 58 (!) 59   Resp: '13 17 17   ' Temp:      TempSrc:      SpO2: 97% 98% 98%   Weight:      Height:        Constitutional: Acute alert and oriented x3, no associated distress.   Skin: no rashes, no lesions, good skin turgor noted. Eyes: Pupils are equally reactive to light.  No evidence of scleral icterus or conjunctival  pallor.  ENMT: Moist mucous  membranes noted.  Posterior pharynx clear of any exudate or lesions.   Neck: normal, supple, no masses, no thyromegaly.  No evidence of jugular venous distension.   Respiratory: clear to auscultation bilaterally, no wheezing, no crackles. Normal respiratory effort. No accessory muscle use.  Cardiovascular: Regular rate and rhythm, no murmurs / rubs / gallops. No extremity edema. 2+ pedal pulses. No carotid bruits.  Chest:   Nontender without crepitus or deformity.   Back:   Nontender without crepitus or deformity. Abdomen: Abdomen is soft and nontender.  No evidence of intra-abdominal masses.  Positive bowel sounds noted in all quadrants.   Musculoskeletal: No joint deformity upper and lower extremities. Good ROM, no contractures. Normal muscle tone.  Neurologic: Mild asterixis noted. CN 2-12 grossly intact. Sensation intact. Faint weakness of the distal muscle groups of the right upper extremity, otherwise normal strength in all extremities. Patient is following all commands.  Patient is responsive to verbal stimuli.   Psychiatric: Patient exhibits normal mood with appropriate affect.  Patient seems to possess insight as to their current situation.     Labs on Admission: I have personally reviewed following labs and imaging studies -   CBC: Recent Labs  Lab 01/21/21 2358  WBC 8.9  NEUTROABS 4.3  HGB 12.7  12.6  HCT 39.7  37.0  MCV 94.7  PLT 287   Basic Metabolic Panel: Recent Labs  Lab 01/21/21 2358 01/22/21 0050  NA 137  138  --   K 4.1  4.1  --   CL 102  101  --   CO2 26  --   GLUCOSE 95  94  --   BUN 24*  25*  --   CREATININE 0.90  0.90  --   CALCIUM 8.9  --   MG  --  2.1   GFR: Estimated Creatinine Clearance: 63.1 mL/min (by C-G formula based on SCr of 0.9 mg/dL). Liver Function Tests: Recent Labs  Lab 01/21/21 2358  AST 56*  ALT 77*  ALKPHOS 92  BILITOT 0.6  PROT 5.9*  ALBUMIN 3.3*   No results for input(s): LIPASE,  AMYLASE in the last 168 hours. Recent Labs  Lab 01/22/21 0050  AMMONIA 21   Coagulation Profile: Recent Labs  Lab 01/21/21 2358  INR 1.0   Cardiac Enzymes: No results for input(s): CKTOTAL, CKMB, CKMBINDEX, TROPONINI in the last 168 hours. BNP (last 3 results) No results for input(s): PROBNP in the last 8760 hours. HbA1C: No results for input(s): HGBA1C in the last 72 hours. CBG: Recent Labs  Lab 01/22/21 0046  GLUCAP 106*   Lipid Profile: No results for input(s): CHOL, HDL, LDLCALC, TRIG, CHOLHDL, LDLDIRECT in the last 72 hours. Thyroid Function Tests: No results for input(s): TSH, T4TOTAL, FREET4, T3FREE, THYROIDAB in the last 72 hours. Anemia Panel: No results for input(s): VITAMINB12, FOLATE, FERRITIN, TIBC, IRON, RETICCTPCT in the last 72 hours. Urine analysis:    Component Value Date/Time   COLORURINE YELLOW 08/07/2019 2007   APPEARANCEUR CLEAR 08/07/2019 2007   LABSPEC 1.020 08/07/2019 2007   LABSPEC 1.015 07/01/2019 1341   PHURINE 5.0 08/07/2019 2007   GLUCOSEU NEGATIVE 08/07/2019 2007   HGBUR NEGATIVE 08/07/2019 2007   BILIRUBINUR NEGATIVE 08/07/2019 2007   BILIRUBINUR negative 07/01/2019 1341   BILIRUBINUR 1 04/23/2017 0913   BILIRUBINUR Negative 05/02/2011 1412   KETONESUR 5 (A) 08/07/2019 2007   PROTEINUR NEGATIVE 08/07/2019 2007   UROBILINOGEN negative (A) 04/23/2017 0913   UROBILINOGEN 0.2 08/25/2015 1045   NITRITE NEGATIVE  08/07/2019 2007   LEUKOCYTESUR TRACE (A) 08/07/2019 2007   LEUKOCYTESUR Moderate 05/02/2011 1412    Radiological Exams on Admission - Personally Reviewed: MR ANGIO HEAD WO CONTRAST  Result Date: 01/22/2021 CLINICAL DATA:  Acute presentation with right arm weakness and speech disturbance. EXAM: MRA HEAD WITHOUT CONTRAST TECHNIQUE: Angiographic images of the Circle of Willis were obtained using MRA technique without intravenous contrast. COMPARISON:  MRI brain same day FINDINGS: The study suffers from some motion degradation.  Both internal carotid arteries are widely patent into the brain. No siphon stenosis. The anterior and middle cerebral vessels are patent without proximal stenosis, aneurysm or vascular malformation. Both vertebral arteries are widely patent at the foramen magnum. Small right vertebral terminates in PICA. Left vertebral artery supplies the basilar. No basilar stenosis. Posterior circulation branch vessels appear normal. IMPRESSION: Normal intracranial MR angiography.  Some motion degradation. Electronically Signed   By: Nelson Chimes M.D.   On: 01/22/2021 00:53   MR BRAIN WO CONTRAST  Result Date: 01/22/2021 CLINICAL DATA:  Acute stroke presentation. Right arm weakness and numbness. Speech disturbance. EXAM: MRI HEAD WITHOUT CONTRAST TECHNIQUE: Multiplanar, multiecho pulse sequences of the brain and surrounding structures were obtained without intravenous contrast. COMPARISON:  None. FINDINGS: Brain: The brain has a normal appearance without evidence of malformation, atrophy, old or acute small or large vessel infarction, mass lesion, hemorrhage, hydrocephalus or extra-axial collection. Vascular: Major vessels at the base of the brain show flow. Venous sinuses appear patent. Skull and upper cervical spine: Normal. Sinuses/Orbits: Clear/normal. Other: None significant. IMPRESSION: Normal examination. Electronically Signed   By: Nelson Chimes M.D.   On: 01/22/2021 00:45   DG Chest Port 1 View  Result Date: 01/22/2021 CLINICAL DATA:  Chest pain EXAM: PORTABLE CHEST 1 VIEW COMPARISON:  05/02/2018 FINDINGS: Lungs volumes are small, but are symmetric and are clear. No pneumothorax or pleural effusion. Cardiac size within normal limits. Pulmonary vascularity is normal. Osseous structures are age-appropriate. No acute bone abnormality. IMPRESSION: No active disease. Electronically Signed   By: Fidela Salisbury MD   On: 01/22/2021 01:47   CT HEAD CODE STROKE WO CONTRAST  Result Date: 01/22/2021 CLINICAL DATA:  Code  stroke. Neurological deficit. Acute stroke suspected. EXAM: CT HEAD WITHOUT CONTRAST TECHNIQUE: Contiguous axial images were obtained from the base of the skull through the vertex without intravenous contrast. COMPARISON:  MRI 09/19/2020 FINDINGS: Brain: The brain has normal appearance without evidence of old or acute infarction, mass lesion, hemorrhage, hydrocephalus or extra-axial collection. Vascular: There is atherosclerotic calcification of the major vessels at the base of the brain. No sign of acute hyperdense vessel. Skull: Normal Sinuses/Orbits: Clear/normal Other: None ASPECTS ([place] Stroke Program Early CT Score) - Ganglionic level infarction (caudate, lentiform nuclei, internal capsule, insula, M1-M3 cortex): 7 - Supraganglionic infarction (M4-M6 cortex): 3 Total score (0-10 with 10 being normal): 10 IMPRESSION: 1. Normal head CT for age. 2. ASPECTS is 10. 3. These results were called by telephone at the time of interpretation on 01/22/2021 at 12:08 am to provider Ripley Fraise , who verbally acknowledged these results. Electronically Signed   By: Nelson Chimes M.D.   On: 01/22/2021 00:09    EKG: Personally reviewed.  Rhythm is sinus rhythm with heart rate of 63 bpm.  No dynamic ST segment changes appreciated.  Assessment/Plan Principal Problem:   Acute right-sided weakness   Patient presenting with several hour history of right-sided weakness.  One possibility would be a TIA although persistence of symptoms and physical examination do  not seem consistent with this.  Considering patient's ongoing headache, another possibility could be an complex migraine with neurologic features -administering recommended cocktail of medications from neurology to attempt to achieve abatement of patient's headache and symptoms.  Alternatively, patient may be suffering from some degree of metabolic encephalopathy although focal complaints such as unilateral weakness are not typical of this type of  syndrome. Obtaining TSH, urinalysis, urine toxicology screen, B12, folate, ESR  Finally, cervical spine compression could cause a similar presentation although again neurologic examination is not entirely convincing of this. Obtaining MRI cervical spine to rule this out.  Monitoring overnight with serial neurologic checks for symptomatic improvement  Monitoring on telemetry  PT evaluation for the morning  Active Problems:   Mixed hyperlipidemia   Continue home regimen of statin therapy    Gout   Continue home regimen of allopurinol and as needed colchicine    Essential hypertension  . Resume patients home regimen or oral antihypertensives . Titrate antihypertensive regimen as necessary to achieve adequate BP control . PRN intravenous antihypertensives for excessively elevated blood pressure     Peripheral polyneuropathy   Continue home regimen of gabapentin    History of migraine headaches   Longstanding known history of migraine headaches makes development of complex migraine a possibility  Managing as above    GERD without esophagitis    Continue home regimen of PPI    Code Status:  Full code Family Communication: Husband is at the bedside who has been updated on plan of care  Status is: Observation  The patient remains OBS appropriate and will d/c before 2 midnights.  Dispo: The patient is from: Home              Anticipated d/c is to: Home              Patient currently is not medically stable to d/c.   Difficult to place patient No        Vernelle Emerald MD [facility] Pager [phone]  If 7PM-7AM, please contact night-coverage [email or web address] Use universal [place] password for that web site. If you do not have the password, please call the hospital operator.  01/22/2021, 3:36 AM

## 2021-01-22 NOTE — Plan of Care (Signed)

## 2021-01-25 NOTE — Progress Notes (Signed)
  Patient presents for hospital follow-up.  She was hospitalized overnight from 2/26-2/26/22. She had episode of R sided weakness and slurred speech. She had neuro consult in hospital, had head CT, MRI of brain and cervical spine, no acute abnormalities noted. She was started on PT, to continue as outpatient. Working diagnosis was TIA vs complicated migraine. She is scheduled for f/u with Dr. Everette Rank (her neuro) next week.  She was also told to f/u with psychiatry, due to her complicated medical regimen  Labs from hospital reviewed-- Normal ESR, TSH, B12 (high), folate, lipids, HIV neg, COVID neg, MG, ammonia, CBC, INR. LFT's elevated, remainder of chem panel okay Lab Results  Component Value Date   ALT 67 (H) 01/22/2021   AST 49 (H) 01/22/2021   ALKPHOS 90 01/22/2021   BILITOT 0.8 01/22/2021   Lab Results  Component Value Date   HGBA1C 6.0 (H) 01/22/2021  urine drug screen + for benzodiazepines  CXR--NAD Head CT normal for age. MR C-spine: IMPRESSION: 1. Largely normal for age MRI appearance of the cervical spine. 2. Mild degeneration at C5-C6 and C6-C7 with borderline spinal stenosis. No spinal cord mass effect. Mild right C6 foraminal stenosis  MRI brain: Normal MRA brain: Normal intracranial MR angiography.  Some motion degradation.   PHYSICAL EXAM:  Wt Readings from Last 3 Encounters:  01/22/21 179 lb 0.2 oz (81.2 kg)  01/02/21 177 lb 3.2 oz (80.4 kg)  11/07/20 168 lb 9.6 oz (76.5 kg)     Cbc, cmet  Home PT F/u with psych and neuro

## 2021-01-26 ENCOUNTER — Ambulatory Visit (INDEPENDENT_AMBULATORY_CARE_PROVIDER_SITE_OTHER): Payer: Medicare Other | Admitting: Family Medicine

## 2021-01-26 ENCOUNTER — Other Ambulatory Visit: Payer: Self-pay

## 2021-01-26 ENCOUNTER — Encounter: Payer: Self-pay | Admitting: Family Medicine

## 2021-01-26 VITALS — BP 124/76 | HR 68 | Ht 62.5 in | Wt 175.8 lb

## 2021-01-26 DIAGNOSIS — Z79899 Other long term (current) drug therapy: Secondary | ICD-10-CM

## 2021-01-26 DIAGNOSIS — Z6831 Body mass index (BMI) 31.0-31.9, adult: Secondary | ICD-10-CM

## 2021-01-26 DIAGNOSIS — R7989 Other specified abnormal findings of blood chemistry: Secondary | ICD-10-CM

## 2021-01-26 DIAGNOSIS — Z5181 Encounter for therapeutic drug level monitoring: Secondary | ICD-10-CM | POA: Diagnosis not present

## 2021-01-26 DIAGNOSIS — R4781 Slurred speech: Secondary | ICD-10-CM

## 2021-01-26 NOTE — Progress Notes (Signed)
Chief Complaint  Patient presents with   Hospitalization Follow-up    Felling better, by the end of the day she feels "wore out."     Patient presents for hospital follow-up.  She was hospitalized overnight from 2/26-2/27/22. She had episode of R sided weakness and slurred speech. HA started after she had been in the hospital (for a few hours, not at the onset of neuro symptoms).  She had neuro consult in hospital, had head CT, MRI of brain and cervical spine, no acute abnormalities noted. She was started on PT, to continue as outpatient. Working diagnosis was TIA vs complicated migraine. She is scheduled for f/u with Dr. Everette Rank (her neuro) next week.  She was also told to f/u with psychiatry, due to her complicated medical regimen  Labs from hospital reviewed-- Normal ESR, TSH, B12 (high), folate, lipids, HIV neg, COVID neg, Mg, ammonia, CBC, INR. LFT's elevated, remainder of chem panel okay Lab Results  Component Value Date   ALT 67 (H) 01/22/2021   AST 49 (H) 01/22/2021   ALKPHOS 90 01/22/2021   BILITOT 0.8 01/22/2021   Lab Results  Component Value Date   HGBA1C 6.0 (H) 01/22/2021  urine drug screen + for benzodiazepines  CXR--NAD Head CT normal for age. MR C-spine: IMPRESSION: 1. Largely normal for age MRI appearance of the cervical spine. 2. Mild degeneration at C5-C6 and C6-C7 with borderline spinal stenosis. No spinal cord mass effect. Mild right C6 foraminal stenosis  MRI brain: Normal MRA brain: Normal intracranial MR angiography.  Some motion degradation.  Today she is feeling stronger than she has been. Denies any recurrent symptoms.  She further describes the episode prior to hospitalization-- Leg was shaking, didn't feel like she had much control.  Body was shaking, hard to get on her bed. Woke her husband up, noticed her slurred speech.  She does have some pain in her R shoulder, which recently has been going down to her hand, by her thumb. Denies any  weakness. She has chronic pain in neck/shoulder related to her fibromyalgia.  PMH, PSH, SH reviewed  Outpatient Encounter Medications as of 01/26/2021  Medication Sig Note   allopurinol (ZYLOPRIM) 300 MG tablet TAKE 1 TABLET BY MOUTH EVERY DAY    ARIPiprazole (ABILIFY) 5 MG tablet Take 1 tablet (5 mg total) by mouth daily.    Armodafinil 250 MG tablet TAKE 1 TABLET BY MOUTH EVERY DAY WITH BREAKFAST    atorvastatin (LIPITOR) 40 MG tablet Take 1 tablet (40 mg total) by mouth daily.    CALCIUM PO Take 500 mg by mouth in the morning, at noon, and at bedtime.    cetirizine (ZYRTEC) 10 MG tablet Take 10 mg by mouth at bedtime.    DULoxetine (CYMBALTA) 60 MG capsule Take 1 capsule (60 mg total) by mouth 2 (two) times daily.    fluticasone (FLONASE) 50 MCG/ACT nasal spray PLACE 2 SPRAYS INTO BOTH NOSTRILS DAILY AS NEEDED FOR ALLERGIES    gabapentin (NEURONTIN) 300 MG capsule Take 300 mg by mouth 3 (three) times daily.    Melatonin 10 MG TABS Take 5 mg by mouth at bedtime.     methocarbamol (ROBAXIN) 500 MG tablet TAKE 1 TABLET BY MOUTH DAILY AS NEEDED    metoprolol tartrate (LOPRESSOR) 50 MG tablet TAKE 1 TABLET BY MOUTH TWICE A DAY    Multiple Vitamins-Minerals (BARIATRIC MULTIVITAMINS/IRON PO) Take 2 each by mouth daily.    Multiple Vitamins-Minerals (ICAPS AREDS 2) CAPS Take 1 capsule by mouth 2 (  two) times daily.    nortriptyline (PAMELOR) 50 MG capsule Take 150 mg by mouth at bedtime.    OXcarbazepine (TRILEPTAL) 150 MG tablet Take 150 mg by mouth 2 (two) times daily.    pantoprazole (PROTONIX) 40 MG tablet Take 1 tablet (40 mg total) by mouth daily.    Probiotic Product ([place]) CAPS Take 1 capsule by mouth at bedtime.     Propylene Glycol 0.6 % SOLN Place 1 drop into both eyes daily at 12 noon.     RESTASIS 0.05 % ophthalmic emulsion 1 drop 2 (two) times daily.    acetaminophen (TYLENOL) 500 MG tablet Take 1,000 mg by mouth 3 (three) times daily as needed  for moderate pain.  (Patient not taking: Reported on 01/26/2021)    ALPRAZolam (XANAX) 0.25 MG tablet Take 1 tablet (0.25 mg total) by mouth 3 (three) times daily as needed for anxiety. (Patient not taking: Reported on 01/26/2021) 01/26/2021: Uses prn, using it once daily recently.   amoxicillin (AMOXIL) 500 MG capsule Take 1,000 mg by mouth 2 (two) times daily. (Patient not taking: Reported on 01/26/2021) 01/26/2021: Take 2 tablets one hour prior to dentist appt and 2 tablets one hour after.   colchicine 0.6 MG tablet TAKE 1 TABLET BY MOUTH DAILY AS NEEDED DURING GOUT FLARES. (Patient not taking: Reported on 01/26/2021)    meclizine (ANTIVERT) 25 MG tablet Take 1 tablet (25 mg total) by mouth 3 (three) times daily as needed for dizziness. (Patient not taking: Reported on 01/26/2021)    ondansetron (ZOFRAN) 8 MG tablet Take 8 mg by mouth every 8 (eight) hours as needed for nausea. (Patient not taking: Reported on 01/26/2021) 05/02/2020: Uses prn, very rarely   prochlorperazine (COMPAZINE) 10 MG tablet Take 10 mg by mouth as needed. (Patient not taking: Reported on 01/26/2021)    promethazine (PHENERGAN) 25 MG tablet Take 25 mg by mouth as needed. (Patient not taking: Reported on 01/26/2021)    No facility-administered encounter medications on file as of 01/26/2021.   Her medications were reviewed in detail today.  Her high doses of pamelor are from neuro for her neuropathy.  She reports that neuropathy has improved since neuro added trileptal (started by neuro for neuropathy, not by psych).  Cymbalta is from psych.   Allergies  Allergen Reactions   Erythromycin Nausea Only    Abdominal pain   Tramadol Itching   Dilaudid [Hydromorphone Hcl] Itching   ROS: no fever, chills, URI symptoms. Denies any current GI complaints.  Neck/shouler pain per HPI, with some radiation down arm recently.  No numbness, tingling.  No further weakness.  No urinary complaints, bleeding, bruising, rash.  Moods are good. +neuropathy in  feet per HPI.   PHYSICAL EXAM:  BP 124/76    Pulse 68    Ht 5' 2.5" (1.588 m)    Wt 175 lb 12.8 oz (79.7 kg)    LMP 07/27/2006    BMI 31.64 kg/m    Wt Readings from Last 3 Encounters:  01/26/21 175 lb 12.8 oz (79.7 kg)  01/22/21 179 lb 0.2 oz (81.2 kg)  01/02/21 177 lb 3.2 oz (80.4 kg)   Well-appearing, pleasant female, in good spirits, in no distress. She is alert and oriented.  HEENT: conjunctiva and sclera are clear, EOMI, wearing mask Neck: No lymphadenopathy, thyromegaly or bruit. No c-spine tenderness. Tender in trapezius muscles bilaterally, R>L. Heart: regular rate and rhythm Lungs: clear bilaterally Back: no spinal or CVA tenderness. No muscle spasm. Abdomen: soft,  nontender Extremities: no edema, normal pulses. Neuro: alert and oriented. Normal strength, sensation intact to LT. DTR's symmetric. Psych: normal mood, affect, hygiene, grooming, eye contact and speech.   ASSESSMENT/PLAN:  Slurred speech - and R sided weakness completely resolved.  HA started hours after other neuro sx? complicated migraine vs TIA vs other. Resolved.  Elevated LFTs - noted in hospital (and previously, but a little higher).  Recheck today.  Ddx reviewed. Wt loss encouraged - Plan: CBC with Differential/Platelet, Comprehensive metabolic panel  Medication monitoring encounter - Plan: CBC with Differential/Platelet, Comprehensive metabolic panel  Polypharmacy - risks of meds reviewed. Encouraged to f/u with neuro to discuss tapering some, and to also address with psych (still need 153m cymbalta?)  BMI 31.0-31.9,adult - +weight gain since her bariatric surgery. Encouraged diet/exercise and f/u with bariatric clinic  High risk medication use - she is on multiple medications affecting CNS, some of which are at high dose. To treat neuropathy, fibromyalgia, depression, RLS migraines  F/u with psych and neuro Continue with outpatient PT as scheduled.  I spent 35 minutes dedicated to the care of  this patient, including pre-visit review of records, face to face time, post-visit ordering of testing and documentation.

## 2021-01-26 NOTE — Patient Instructions (Signed)
Follow up with your neurologist as planned. You may also want to discuss your medications with the psychiatrist, after seeing neuro (in case adjustments are being made).  My concerns are the high doses of cymbalta, nortriptylene, in addition to many other mediations (gabapentin, trileptal).  Potential for over-medication and side effects from many medications, at somewhat high doses.  Your strength is normal today, and I'm so glad you're feeling better!  Your A1c was up--be sure to limit your sugar in your diet (from beverages, fruit, sweets, carbs).  Continue to work on weight loss (which may be contributing to the mild elevation in the liver tests) and follow-up with bariatric clinic.

## 2021-01-27 LAB — CBC WITH DIFFERENTIAL/PLATELET
Basophils Absolute: 0.1 10*3/uL (ref 0.0–0.2)
Basos: 1 %
EOS (ABSOLUTE): 0.2 10*3/uL (ref 0.0–0.4)
Eos: 3 %
Hematocrit: 40.2 % (ref 34.0–46.6)
Hemoglobin: 13 g/dL (ref 11.1–15.9)
Immature Grans (Abs): 0 10*3/uL (ref 0.0–0.1)
Immature Granulocytes: 0 %
Lymphocytes Absolute: 3.1 10*3/uL (ref 0.7–3.1)
Lymphs: 37 %
MCH: 30.4 pg (ref 26.6–33.0)
MCHC: 32.3 g/dL (ref 31.5–35.7)
MCV: 94 fL (ref 79–97)
Monocytes Absolute: 0.8 10*3/uL (ref 0.1–0.9)
Monocytes: 10 %
Neutrophils Absolute: 4.2 10*3/uL (ref 1.4–7.0)
Neutrophils: 49 %
Platelets: 306 10*3/uL (ref 150–450)
RBC: 4.27 x10E6/uL (ref 3.77–5.28)
RDW: 12.9 % (ref 11.7–15.4)
WBC: 8.4 10*3/uL (ref 3.4–10.8)

## 2021-01-27 LAB — COMPREHENSIVE METABOLIC PANEL
ALT: 59 IU/L — ABNORMAL HIGH (ref 0–32)
AST: 39 IU/L (ref 0–40)
Albumin/Globulin Ratio: 1.7 (ref 1.2–2.2)
Albumin: 3.9 g/dL (ref 3.8–4.8)
Alkaline Phosphatase: 123 IU/L — ABNORMAL HIGH (ref 44–121)
BUN/Creatinine Ratio: 23 (ref 12–28)
BUN: 22 mg/dL (ref 8–27)
Bilirubin Total: <0.2 mg/dL (ref 0.0–1.2)
CO2: 28 mmol/L (ref 20–29)
Calcium: 9.2 mg/dL (ref 8.7–10.3)
Chloride: 101 mmol/L (ref 96–106)
Creatinine, Ser: 0.94 mg/dL (ref 0.57–1.00)
Globulin, Total: 2.3 g/dL (ref 1.5–4.5)
Glucose: 80 mg/dL (ref 65–99)
Potassium: 5 mmol/L (ref 3.5–5.2)
Sodium: 143 mmol/L (ref 134–144)
Total Protein: 6.2 g/dL (ref 6.0–8.5)
eGFR: 68 mL/min/{1.73_m2} (ref >59)

## 2021-01-30 ENCOUNTER — Other Ambulatory Visit: Payer: Self-pay | Admitting: Physician Assistant

## 2021-01-30 ENCOUNTER — Encounter: Payer: Medicare Other | Attending: Surgery | Admitting: Skilled Nursing Facility1

## 2021-01-30 ENCOUNTER — Other Ambulatory Visit: Payer: Self-pay

## 2021-01-30 DIAGNOSIS — K3184 Gastroparesis: Secondary | ICD-10-CM | POA: Insufficient documentation

## 2021-01-30 DIAGNOSIS — E669 Obesity, unspecified: Secondary | ICD-10-CM

## 2021-01-30 NOTE — Telephone Encounter (Signed)
Last Visit: 01/02/2021 Next Visit: 07/03/2021  Current Dose per office note on 01/02/2021,  Robaxin on as needed basis.  Dx: Fibromyalgia   Last Fill: 11/30/2020  Okay to refill Robaxin?

## 2021-01-30 NOTE — Progress Notes (Signed)
Bariatric Nutrition Follow-Up Visit Medical Nutrition Therapy    Post-Operative RYGB Surgery Surgery Date: 10/13/2019 NUTRITION ASSESSMENT    Anthropometrics  Start weight at [facility]: 225.8 lbs (date: 05/06/2018) Today's weight: 166.3 lbs  Body Composition Scale 12/28/2019 03/29/2020 05/31/2020 08/03/2020 10/24/2020  Total Body Fat % 41.3 39 38.3 37.5 37.9  Visceral Fat 13 11 11 11 11   Fat-Free Mass % 58.6 60.9 61.6 62.4 62   Total Body Water % 43.8 44.9 45.3 45.7 45.5   Muscle-Mass lbs 27.5 27.2 27.1 27.1 27.1  Body Fat Displacement              Torso  lbs 48 41.5 39.7 38.1 39         Left Leg  lbs 9.6 8.3 7.9 7.6 7.8         Right Leg  lbs 9.6 8.3 7.9 7.6 7.8         Left Arm  lbs 4.8 4.1 3.9 3.8 3.9         Right Arm   lbs 4.8 4.1 3.9 3.8 3.9    Clinical  Medical hx: migraines, OSA, GERD, kidney stones, fibromyalgia Medications: topamax, protonix, lopressor, gabapentin, hydrocodone, xanax, abilify Labs: B12 1244; alkaline phosphatase 123,  ALT 59   Lifestyle & Dietary Hx   Pt states 2 weeks ago she thought she was having a stroke but it was possibly a complicated headache. Pt states she has not gone to [facility] until her neurologist clears her. Pt states she is taking 2 bariatric  Multivitamin. Pt states she has okay days with her food and then not so good days with her foods eating more calorically dense foods.   Pt was having a lot of trouble communicating what she feels she needs to change stating she does not do these behaviors often enough to cause weight gain but then backs up on that thought: Dietitian advised pt to take pictures of everything she puts into her mouth to track in real time how she is actually eating realistically.   Estimated daily fluid intake: 48 oz Estimated daily protein intake: 60+ g Supplements: bari advantage, calcium  Current average weekly physical activity: [facility]  24-Hr Dietary Recall protein shake throughout the week First Meal: Cereal +  Banana,   Snack: carrots or candy Second Meal: salad: lettuce, broccoli, spinach, carrots + pizza Snack: cucumber or carrots Third Meal: Salad or [place] cresant Snack: apple Beverages: water + gaterade zero  Post-Op Goals/ Signs/ Symptoms Using straws: no Drinking while eating: no Chewing/swallowing difficulties: no Changes in vision: no Changes to mood/headaches: no Hair loss/changes to skin/nails: no Difficulty focusing/concentrating: no Sweating: no Dizziness/lightheadedness: no Palpitations: no  Carbonated/caffeinated beverages: no N/V/D/C/Gas: no Abdominal pain: no Dumping syndrome: no    NUTRITION DIAGNOSIS  Overweight/obesity ([place]-3.3) related to past poor dietary habits and physical inactivity as evidenced by completed bariatric surgery and following dietary guidelines for continued weight loss and healthy nutrition status.     NUTRITION INTERVENTION Nutrition counseling (C-1) and education (E-2) to facilitate bariatric surgery goals, including: . The importance of consuming adequate calories as well as certain nutrients daily due to the body's need for essential vitamins, minerals, and fats . The importance of daily physical activity and to reach a goal of at least 150 minutes of moderate to vigorous physical activity weekly (or as directed by their physician) due to benefits such as increased musculature and improved lab values Encouraged patient to honor their body's internal hunger and fullness cues.  Throughout  the day, check in mentally and rate hunger. Stop eating when satisfied not full regardless of how much food is left on the plate.  Get more if still hungry 20-30 minutes later.  The key is to honor satisfaction so throughout the meal, rate fullness factor and stop when comfortably satisfied not physically full. The key is to honor hunger and fullness without any feelings of guilt or shame.  Pay attention to what the internal cues are, rather than any external  factors. This will enhance the confidence you have in listening to your own body and following those internal cues enabling you to increase how often you eat when you are hungry not out of appetite and stop when you are satisfied not full.  Encouraged pt to continue to eat balanced meals inclusive of non starchy vegetables 2 times a day 7 days a week Encouraged pt to choose lean protein sources: limiting beef, pork, sausage, hotdogs, and lunch meat Encourage pt to choose healthy fats such as plant based limiting animal fats . Encouraged pt to continue to drink a minium 64 fluid ounces with half being plain water to satisfy proper hydration   Handouts Provided Include    Goals: Take a picture of your multivitamin and send it in an e-mail  Take a picture of everything you put into your mouth Contact your psychiatrist for a follow up as stated by your PCP Aim for a minimum 64 fluid ounces per day   Learning Style & Readiness for Change Teaching method utilized: Visual & Auditory  Demonstrated degree of understanding via: Teach Back  Barriers to learning/adherence to lifestyle change: none identified   RD's Notes for Next Visit . Assess adherence to pt chosen goals   MONITORING & EVALUATION Dietary intake, weekly physical activity, body weight:

## 2021-01-31 DIAGNOSIS — R202 Paresthesia of skin: Secondary | ICD-10-CM | POA: Diagnosis not present

## 2021-01-31 DIAGNOSIS — R4781 Slurred speech: Secondary | ICD-10-CM | POA: Diagnosis not present

## 2021-01-31 DIAGNOSIS — G253 Myoclonus: Secondary | ICD-10-CM | POA: Diagnosis not present

## 2021-01-31 DIAGNOSIS — R519 Headache, unspecified: Secondary | ICD-10-CM | POA: Diagnosis not present

## 2021-02-01 ENCOUNTER — Other Ambulatory Visit: Payer: Self-pay | Admitting: Rheumatology

## 2021-02-01 NOTE — Telephone Encounter (Signed)
Last Visit: 01/02/2021 Next Visit: 07/03/2021  Current Dose per office note on 01/02/2021, Nuvigil for fatigue Dx: Fatigue  Last Fill: 12/06/2020  Okay to refill Armodafinil?

## 2021-02-02 ENCOUNTER — Ambulatory Visit: Payer: Medicare Other | Admitting: Internal Medicine

## 2021-02-08 ENCOUNTER — Ambulatory Visit: Payer: Medicare Other | Admitting: Rehabilitative and Restorative Service Providers"

## 2021-03-01 ENCOUNTER — Other Ambulatory Visit: Payer: Self-pay

## 2021-03-01 ENCOUNTER — Encounter: Payer: Medicare Other | Attending: Surgery | Admitting: Skilled Nursing Facility1

## 2021-03-01 DIAGNOSIS — E669 Obesity, unspecified: Secondary | ICD-10-CM | POA: Diagnosis present

## 2021-03-01 NOTE — Progress Notes (Signed)
Bariatric Nutrition Follow-Up Visit Medical Nutrition Therapy    Post-Operative RYGB Surgery Surgery Date: 10/13/2019 NUTRITION ASSESSMENT    Anthropometrics  Start weight at [facility]: 225.8 lbs (date: 05/06/2018) Today's weight: 172 lbs  Body Composition Scale 12/28/2019 03/29/2020 05/31/2020 08/03/2020 10/24/2020 03/01/2021  Total Body Fat % 41.3 39 38.3 37.5 37.9 39  Visceral Fat 13 11 11 11 11 11   Fat-Free Mass % 58.6 60.9 61.6 62.4 62 60.9   Total Body Water % 43.8 44.9 45.3 45.7 45.5 44.9   Muscle-Mass lbs 27.5 27.2 27.1 27.1 27.1 27.2  Body Fat Displacement               Torso  lbs 48 41.5 39.7 38.1 39 41.5         Left Leg  lbs 9.6 8.3 7.9 7.6 7.8 8.3         Right Leg  lbs 9.6 8.3 7.9 7.6 7.8 8.3         Left Arm  lbs 4.8 4.1 3.9 3.8 3.9 4.1         Right Arm   lbs 4.8 4.1 3.9 3.8 3.9 4.1    Clinical  Medical hx: migraines, OSA, GERD, kidney stones, fibromyalgia Medications: topamax, protonix, lopressor, gabapentin, hydrocodone, xanax, abilify Labs: B12 1244; alkaline phosphatase 123,  ALT 59   Lifestyle & Dietary Hx  Pt state she is aggravated with herself for gaining weight and losing good habits. Pt states she has been logging her foods but left her book at home stating she learned she has been eating more calorically dense foods. Pt states when she knows she will be out all day she would brig vegetables as snacks but fell out of that habit. Pt states she has an appt with her psychiatrist next week. Pt states she needs to watch what she is eating more carefully.  Pt states she feels if she gets her water in she is less likely to eat goodies and to not buy it.   Estimated daily fluid intake: 48 oz Estimated daily protein intake: 60+ g Supplements: bari advantage, calcium  Current average weekly physical activity: [facility]  24-Hr Dietary Recall protein shake throughout the week First Meal: Cereal + Banana or premier protein before gym or egg + tenderloin + wheat  toast Snack: carrots or candy Second Meal: salad: lettuce, broccoli, spinach, carrots + pizza or premier protein + cucumbers + carrots Snack: cucumber or carrots Third Meal: Salad or [place] croissant or half hamburger patty + salad Snack: apple Beverages: water + gaterade zero  Post-Op Goals/ Signs/ Symptoms Using straws: no Drinking while eating: no Chewing/swallowing difficulties: no Changes in vision: no Changes to mood/headaches: no Hair loss/changes to skin/nails: no Difficulty focusing/concentrating: no Sweating: no Dizziness/lightheadedness: no Palpitations: no  Carbonated/caffeinated beverages: no N/V/D/C/Gas: no Abdominal pain: no Dumping syndrome: no    NUTRITION DIAGNOSIS  Overweight/obesity ([place]-3.3) related to past poor dietary habits and physical inactivity as evidenced by completed bariatric surgery and following dietary guidelines for continued weight loss and healthy nutrition status.     NUTRITION INTERVENTION: Continued Nutrition counseling (C-1) and education (E-2) to facilitate bariatric surgery goals, including: . The importance of consuming adequate calories as well as certain nutrients daily due to the body's need for essential vitamins, minerals, and fats . The importance of daily physical activity and to reach a goal of at least 150 minutes of moderate to vigorous physical activity weekly (or as directed by their physician) due to benefits such as increased  musculature and improved lab values Encouraged patient to honor their body's internal hunger and fullness cues.  Throughout the day, check in mentally and rate hunger. Stop eating when satisfied not full regardless of how much food is left on the plate.  Get more if still hungry 20-30 minutes later.  The key is to honor satisfaction so throughout the meal, rate fullness factor and stop when comfortably satisfied not physically full. The key is to honor hunger and fullness without any feelings of guilt or  shame.  Pay attention to what the internal cues are, rather than any external factors. This will enhance the confidence you have in listening to your own body and following those internal cues enabling you to increase how often you eat when you are hungry not out of appetite and stop when you are satisfied not full.  Encouraged pt to continue to eat balanced meals inclusive of non starchy vegetables 2 times a day 7 days a week Encouraged pt to choose lean protein sources: limiting beef, pork, sausage, hotdogs, and lunch meat Encourage pt to choose healthy fats such as plant based limiting animal fats . Encouraged pt to continue to drink a minium 64 fluid ounces with half being plain water to satisfy proper hydration   Handouts Provided Include  N/A   Goals:  Aim for a minimum 64 fluid ounces per day Make your snack bag even if you are not sure if you will be out all day or not Continue to write down your foods and drinks  Find something else to do after dinner such as crafting or bible study to keep from snacking  Walk on your days off from [place] starting tomorrow if weather is not nice get on your bike  Learning Style & Readiness for Change Teaching method utilized: Visual & Auditory  Demonstrated degree of understanding via: Teach Back  Barriers to learning/adherence to lifestyle change: none identified   RD's Notes for Next Visit . Assess adherence to pt chosen goals   MONITORING & EVALUATION Dietary intake, weekly physical activity, body weight:

## 2021-03-09 ENCOUNTER — Encounter: Payer: Self-pay | Admitting: Adult Health

## 2021-03-09 ENCOUNTER — Other Ambulatory Visit: Payer: Self-pay

## 2021-03-09 ENCOUNTER — Ambulatory Visit (INDEPENDENT_AMBULATORY_CARE_PROVIDER_SITE_OTHER): Payer: Medicare Other | Admitting: Adult Health

## 2021-03-09 DIAGNOSIS — F331 Major depressive disorder, recurrent, moderate: Secondary | ICD-10-CM | POA: Diagnosis not present

## 2021-03-09 DIAGNOSIS — G4700 Insomnia, unspecified: Secondary | ICD-10-CM

## 2021-03-09 DIAGNOSIS — F411 Generalized anxiety disorder: Secondary | ICD-10-CM

## 2021-03-09 DIAGNOSIS — F3181 Bipolar II disorder: Secondary | ICD-10-CM | POA: Diagnosis not present

## 2021-03-09 MED ORDER — ARIPIPRAZOLE 5 MG PO TABS
5.0000 mg | ORAL_TABLET | Freq: Every day | ORAL | 1 refills | Status: DC
Start: 2021-03-09 — End: 2021-09-08

## 2021-03-09 MED ORDER — ALPRAZOLAM 0.25 MG PO TABS
0.2500 mg | ORAL_TABLET | Freq: Three times a day (TID) | ORAL | 2 refills | Status: DC | PRN
Start: 2021-03-09 — End: 2021-09-08

## 2021-03-09 MED ORDER — DULOXETINE HCL 60 MG PO CPEP
60.0000 mg | ORAL_CAPSULE | Freq: Two times a day (BID) | ORAL | 1 refills | Status: DC
Start: 2021-03-09 — End: 2021-09-08

## 2021-03-09 NOTE — Progress Notes (Signed)
[name] [id number] 1956/01/30 65 y.o.  Subjective:   Patient ID:  [name] is a 65 y.o. (DOB 04/01/1956) female.  Chief Complaint: No chief complaint on file.   HPI VENOLA CASTELLO presents to the office today for follow-up of anxiety, depression, and insomnia.  Describes mood today as "ok". Pleasant. Mood symptoms - reports some  anxiety - at times. Denies depression and irritability. Stating "I'm doing alright". Seen in ED 2/26 for slurred speech weakness and followed up with Neurologist.Notes having a little bit of a hard time with loss of mother's neighbor who she was close with. Mother - age 43 - lives alone - she and sister visiting most days. Husband with health issues - shoulder hurting - MRI scheduled. Working with weight loss program. Stable interest and motivation. Taking medications as prescribed.  Energy levels stable. Active, has a regular exercise routine. Working with a Clinical research associate. Enjoys some usual interests and activities. Married. Lives with husband of 67 years. Has 2 sons - one local and another one in [place]. Mother local. Spending time with family. Appetite ade quate. Weight stable - 170 pounds. Sleeps better some nights than others. Averages 4 to 5 hours. Focus and concentration stable. Completing tasks. Managing aspects of household.  Denies SI or HI.  Denies AH or VH.  Bariatric surgery 11/20 - weight loss 65 pounds.      [place] Office Visit from 01/26/2021 in [place] from 11/07/2020 in [place] Visit from 05/02/2020 in [place] Visit from 04/15/2019 in [place] from 11/12/2018 [facility]  PHQ-2 Total Score 0 0 0 0 0    Flowsheet Row ED to Hosp-Admission (Discharged) from 01/21/2021 in [facility]  C-SSRS RISK CATEGORY No Risk       Review of Systems:  Review of Systems   Musculoskeletal: Negative for gait problem.  Neurological: Negative for tremors.  Psychiatric/Behavioral:       Please refer to HPI    Medications: I have reviewed the patient's current medications.  Current Outpatient Medications  Medication Sig Dispense Refill  . acetaminophen (TYLENOL) 500 MG tablet Take 1,000 mg by mouth 3 (three) times daily as needed for moderate pain.  (Patient not taking: Reported on 01/26/2021)    . allopurinol (ZYLOPRIM) 300 MG tablet TAKE 1 TABLET BY MOUTH EVERY DAY 90 tablet 1  . ALPRAZolam (XANAX) 0.25 MG tablet Take 1 tablet (0.25 mg total) by mouth 3 (three) times daily as needed for anxiety. 90 tablet 2  . amoxicillin (AMOXIL) 500 MG capsule Take 1,000 mg by mouth 2 (two) times daily. (Patient not taking: Reported on 01/26/2021)    . ARIPiprazole (ABILIFY) 5 MG tablet Take 1 tablet (5 mg total) by mouth daily. 90 tablet 1  . Armodafinil 250 MG tablet TAKE 1 TABLET BY MOUTH EVERY DAY WITH BREAKFAST 30 tablet 2  . atorvastatin (LIPITOR) 40 MG tablet Take 1 tablet (40 mg total) by mouth daily. 90 tablet 3  . CALCIUM PO Take 500 mg by mouth in the morning, at noon, and at bedtime.    . cetirizine (ZYRTEC) 10 MG tablet Take 10 mg by mouth at bedtime.    . colchicine 0.6 MG tablet TAKE 1 TABLET BY MOUTH DAILY AS NEEDED DURING GOUT FLARES. (Patient not taking: Reported on 01/26/2021) 30 tablet 0  . DULoxetine (CYMBALTA) 60 MG capsule Take 1 capsule (60  mg total) by mouth 2 (two) times daily. 180 capsule 1  . fluticasone (FLONASE) 50 MCG/ACT nasal spray PLACE 2 SPRAYS INTO BOTH NOSTRILS DAILY AS NEEDED FOR ALLERGIES 16 mL 2  . gabapentin (NEURONTIN) 300 MG capsule Take 300 mg by mouth 3 (three) times daily.    . meclizine (ANTIVERT) 25 MG tablet Take 1 tablet (25 mg total) by mouth 3 (three) times daily as needed for dizziness. (Patient not taking: Reported on 01/26/2021) 30 tablet 0  . Melatonin 10 MG TABS Take 5 mg by mouth at bedtime.     . methocarbamol (ROBAXIN) 500  MG tablet TAKE 1 TABLET BY MOUTH EVERY DAY AS NEEDED 30 tablet 0  . metoprolol tartrate (LOPRESSOR) 50 MG tablet TAKE 1 TABLET BY MOUTH TWICE A DAY 180 tablet 1  . Multiple Vitamins-Minerals (BARIATRIC MULTIVITAMINS/IRON PO) Take 2 each by mouth daily.    . Multiple Vitamins-Minerals (ICAPS AREDS 2) CAPS Take 1 capsule by mouth 2 (two) times daily.    . nortriptyline (PAMELOR) 50 MG capsule Take 150 mg by mouth at bedtime.    . ondansetron (ZOFRAN) 8 MG tablet Take 8 mg by mouth every 8 (eight) hours as needed for nausea. (Patient not taking: Reported on 01/26/2021)  0  . OXcarbazepine (TRILEPTAL) 150 MG tablet Take 150 mg by mouth 2 (two) times daily.    . pantoprazole (PROTONIX) 40 MG tablet Take 1 tablet (40 mg total) by mouth daily. 90 tablet 0  . Probiotic Product ([place]) CAPS Take 1 capsule by mouth at bedtime.     . prochlorperazine (COMPAZINE) 10 MG tablet Take 10 mg by mouth as needed. (Patient not taking: Reported on 01/26/2021)    . promethazine (PHENERGAN) 25 MG tablet Take 25 mg by mouth as needed. (Patient not taking: Reported on 01/26/2021)    . Propylene Glycol 0.6 % SOLN Place 1 drop into both eyes daily at 12 noon.     . RESTASIS 0.05 % ophthalmic emulsion 1 drop 2 (two) times daily.     No current facility-administered medications for this visit.    Medication Side Effects: None  Allergies:  Allergies  Allergen Reactions  . Erythromycin Nausea Only    Abdominal pain  . Tramadol Itching  . Dilaudid [Hydromorphone Hcl] Itching    Past Medical History:  Diagnosis Date  . Anemia    previously followed by Dr. Jamse Arn for anemia and elevated platelets  . Anxiety   . C. difficile colitis 10/01/12   treated by [facility]  . Chronic fatigue syndrome   . DDD (degenerative disc disease), lumbar 08/19/14   and facet arthroplasty & left lumbar radiculopathy (Dr.Ramos)  . Depression   . Dyssynergia    dyssynergenic defecation, contributing to fecal incontinence.  .  Edema   . Fibromyalgia   . Gastroparesis    followed at [facility]  . GERD (gastroesophageal reflux disease)   . History of kidney stones   . Hyperlipidemia   . Kidney stone   . Lumbar radiculopathy   . Migraine   . Neuropathy   . Obstructive sleep apnea    Does  wear  CPAP  . Paresthesia    Dr. Everette Rank at [facility]  . Pelvic floor dysfunction    pelvic floor dyssynergy  . Plantar fasciitis 02/2011   R foot  . Pneumonia    2012  . PONV (postoperative nausea and vomiting)    pt states has gastroparesis has difficulty taking antibiotics and narcotics has  severe nausea and vomiting   . Restless leg syndrome   . S/P endometrial ablation 08/09/2006   Novasure Ablation  . S/P epidural steroid injection 09/20/14   Dr.Ramos  . Tremor    Dr. Everette Rank  . Urinary frequency   . Urinary incontinence     Family History  Problem Relation Age of Onset  . Allergies Mother   . Hypertension Mother   . Heart disease Mother        possible valve problem - leaking valve  . Macular degeneration Mother   . Heart disease Father        pacemaker, CHF  . Hypertension Father   . Diabetes Father        borderline  . Stroke Father 102  . Kidney disease Father   . Asthma Sister   . Irritable bowel syndrome Sister   . Allergies Sister   . Heart disease Paternal Grandmother   . Heart disease Paternal Grandfather   . Cancer Maternal Aunt        leukemia  . Cancer Maternal Aunt   . Colon cancer Maternal Aunt        late 86's  . Heart disease Maternal Grandmother   . Heart disease Maternal Grandfather   . CAD Neg Hx     Social History   Socioeconomic History  . Marital status: Married    Spouse name: Not on file  . Number of children: 2  . Years of education: Not on file  . Highest education level: Not on file  Occupational History  . Occupation: Therapist, art (on disability)    Employer: [employer]  Tobacco Use  . Smoking status: Never Smoker  . Smokeless tobacco: Never  Used  Vaping Use  . Vaping Use: Never used  Substance and Sexual Activity  . Alcohol use: No    Alcohol/week: 0.0 standard drinks  . Drug use: No  . Sexual activity: Not Currently    Birth control/protection: Post-menopausal    Comment: 1st intercourse 65 yo-Fewer than 5 partners  Other Topics Concern  . Not on file  Social History Narrative   Married, 1 dog. 1 son in [place] (grandson born 06/2017), 1 stepson in [place], with 2 children   Social Determinants of Health   Financial Resource Strain: Not on file  Food Insecurity: Not on file  Transportation Needs: Not on file  Physical Activity: Not on file  Stress: Not on file  Social Connections: Not on file  Intimate Partner Violence: Not on file    Past Medical History, Surgical history, Social history, and Family history were reviewed and updated as appropriate.   Please see review of systems for further details on the patient's review from today.   Objective:   Physical Exam:  LMP 07/27/2006   Physical Exam Constitutional:      General: She is not in acute distress. Musculoskeletal:        General: No deformity.  Neurological:     Mental Status: She is alert and oriented to person, place, and time.     Coordination: Coordination normal.  Psychiatric:        Attention and Perception: Attention and perception normal. She does not perceive auditory or visual hallucinations.        Mood and Affect: Mood normal. Mood is not anxious or depressed. Affect is not labile, blunt, angry or inappropriate.        Speech: Speech normal.        Behavior:  Behavior normal.        Thought Content: Thought content normal. Thought content is not paranoid or delusional. Thought content does not include homicidal or suicidal ideation. Thought content does not include homicidal or suicidal plan.        Cognition and Memory: Cognition and memory normal.        Judgment: Judgment normal.     Comments: Insight intact     Lab Review:      Component Value Date/Time   NA 143 01/26/2021 1204   K 5.0 01/26/2021 1204   CL 101 01/26/2021 1204   CO2 28 01/26/2021 1204   GLUCOSE 80 01/26/2021 1204   GLUCOSE 87 01/22/2021 0457   BUN 22 01/26/2021 1204   CREATININE 0.94 01/26/2021 1204   CREATININE 0.90 07/04/2020 1129   CALCIUM 9.2 01/26/2021 1204   PROT 6.2 01/26/2021 1204   ALBUMIN 3.9 01/26/2021 1204   AST 39 01/26/2021 1204   ALT 59 (H) 01/26/2021 1204   ALKPHOS 123 (H) 01/26/2021 1204   BILITOT <0.2 01/26/2021 1204   GFRNONAA >60 01/22/2021 0457   GFRNONAA 68 07/04/2020 1129   GFRAA 81 11/07/2020 1029   GFRAA 79 07/04/2020 1129       Component Value Date/Time   WBC 8.4 01/26/2021 1204   WBC 8.9 01/21/2021 2358   RBC 4.27 01/26/2021 1204   RBC 4.19 01/21/2021 2358   HGB 13.0 01/26/2021 1204   HGB 11.7 05/02/2011 1412   HCT 40.2 01/26/2021 1204   HCT 37.1 05/02/2011 1412   PLT 306 01/26/2021 1204   MCV 94 01/26/2021 1204   MCV 94.4 05/02/2011 1412   MCH 30.4 01/26/2021 1204   MCH 30.3 01/21/2021 2358   MCHC 32.3 01/26/2021 1204   MCHC 32.0 01/21/2021 2358   RDW 12.9 01/26/2021 1204   RDW 18.3 (H) 05/02/2011 1412   LYMPHSABS 3.1 01/26/2021 1204   LYMPHSABS 2.4 05/02/2011 1412   MONOABS 0.9 01/21/2021 2358   MONOABS 0.8 05/02/2011 1412   EOSABS 0.2 01/26/2021 1204   BASOSABS 0.1 01/26/2021 1204   BASOSABS 0.1 05/02/2011 1412    No results found for: POCLITH, LITHIUM   No results found for: PHENYTOIN, PHENOBARB, VALPROATE, CBMZ   .res Assessment: Plan:    Plan:  1. Xanax 0.25mg  TID 2. Cymbalta 60mg  BID 3. Abilify 5mg  daily  Melatonin PRN  RTC 6 months  Patient advised to contact office with any questions, adverse effects, or acute worsening in signs and symptoms.  Discussed potential metabolic side effects associated with atypical antipsychotics, as well as potential risk for movement side effects. Advised pt to contact office if movement side effects occur.   Discussed potential  metabolic side effects associated with atypical antipsychotics, as well as potential risk for movement side effects. Advised pt to contact office if movement side effects occur.    Diagnoses and all orders for this visit:  Bipolar II disorder ([place])  Insomnia, unspecified type -     ALPRAZolam (XANAX) 0.25 MG tablet; Take 1 tablet (0.25 mg total) by mouth 3 (three) times daily as needed for anxiety.  Major depressive disorder, recurrent episode, moderate (HCC) -     DULoxetine (CYMBALTA) 60 MG capsule; Take 1 capsule (60 mg total) by mouth 2 (two) times daily. -     ARIPiprazole (ABILIFY) 5 MG tablet; Take 1 tablet (5 mg total) by mouth daily.  Generalized anxiety disorder -     DULoxetine (CYMBALTA) 60 MG capsule; Take 1 capsule (60 mg total)  by mouth 2 (two) times daily. -     ARIPiprazole (ABILIFY) 5 MG tablet; Take 1 tablet (5 mg total) by mouth daily.     Please see After Visit Summary for patient specific instructions.  Future Appointments  Date Time Provider [place]  05/01/2021  8:00 AM Ruby Cola, RD [place] [facility]  06/28/2021  9:45 AM Rita Ohara, MD [facility] [place]  07/03/2021 10:15 AM Bo Merino, MD [facility] None    No orders of the defined types were placed in this encounter.   -------------------------------

## 2021-03-16 ENCOUNTER — Ambulatory Visit: Payer: Medicare Other | Admitting: Podiatry

## 2021-03-17 DIAGNOSIS — H04123 Dry eye syndrome of bilateral lacrimal glands: Secondary | ICD-10-CM | POA: Diagnosis not present

## 2021-03-17 DIAGNOSIS — D3132 Benign neoplasm of left choroid: Secondary | ICD-10-CM | POA: Diagnosis not present

## 2021-03-17 DIAGNOSIS — Z79899 Other long term (current) drug therapy: Secondary | ICD-10-CM | POA: Diagnosis not present

## 2021-03-17 DIAGNOSIS — H2513 Age-related nuclear cataract, bilateral: Secondary | ICD-10-CM | POA: Diagnosis not present

## 2021-03-17 DIAGNOSIS — H353131 Nonexudative age-related macular degeneration, bilateral, early dry stage: Secondary | ICD-10-CM | POA: Diagnosis not present

## 2021-03-23 ENCOUNTER — Other Ambulatory Visit: Payer: Self-pay | Admitting: Physician Assistant

## 2021-03-23 NOTE — Telephone Encounter (Signed)
Next Visit: 07/03/2021  Last Visit: 01/02/2021  Last Fill: 01/30/2021  Dx: Fibromyalgia  Current Dose per office note on 01/02/2021,  Robaxin on as needed basis  Okay to refill Robaxin?

## 2021-03-27 ENCOUNTER — Ambulatory Visit: Payer: Medicare Other | Admitting: Podiatry

## 2021-03-27 ENCOUNTER — Other Ambulatory Visit: Payer: Self-pay

## 2021-03-27 ENCOUNTER — Ambulatory Visit (INDEPENDENT_AMBULATORY_CARE_PROVIDER_SITE_OTHER): Payer: Medicare Other

## 2021-03-27 DIAGNOSIS — M85679 Other cyst of bone, unspecified ankle and foot: Secondary | ICD-10-CM

## 2021-03-27 DIAGNOSIS — M85671 Other cyst of bone, right ankle and foot: Secondary | ICD-10-CM | POA: Diagnosis not present

## 2021-03-27 NOTE — Progress Notes (Signed)
HPI: 65 y.o. female presenting today for continued pain and tenderness to the right great toe.  Patient states that approximately 6 months ago she had total permanent nail matricectomy to the right great toe but this did not alleviate her symptoms.  She would like to have it evaluated further.  She states that she continues to have swelling to the right great toe  Past Medical History:  Diagnosis Date  . Anemia    previously followed by Dr. Jamse Arn for anemia and elevated platelets  . Anxiety   . C. difficile colitis 10/01/12   treated by [facility]  . Chronic fatigue syndrome   . DDD (degenerative disc disease), lumbar 08/19/14   and facet arthroplasty & left lumbar radiculopathy (Dr.Ramos)  . Depression   . Dyssynergia    dyssynergenic defecation, contributing to fecal incontinence.  . Edema   . Fibromyalgia   . Gastroparesis    followed at [facility]  . GERD (gastroesophageal reflux disease)   . History of kidney stones   . Hyperlipidemia   . Kidney stone   . Lumbar radiculopathy   . Migraine   . Neuropathy   . Obstructive sleep apnea    Does  wear  CPAP  . Paresthesia    [facility]. Everette Rank at [facility]  . Pelvic floor dysfunction    pelvic floor dyssynergy  . Plantar fasciitis 02/2011   R foot  . Pneumonia    2012  . PONV (postoperative nausea and vomiting)    pt states has gastroparesis has difficulty taking antibiotics and narcotics has severe nausea and vomiting   . Restless leg syndrome   . S/P endometrial ablation 08/09/2006   Novasure Ablation  . S/P epidural steroid injection 09/20/14   Dr.Ramos  . Tremor    Dr. Everette Rank  . Urinary frequency   . Urinary incontinence      Physical Exam: General: The patient is alert and oriented x3 in no acute distress.  Dermatology: Skin is warm, dry and supple bilateral lower extremities. Negative for open lesions or macerations.  Absence of the nail plate noted right great toe with associated tenderness to palpation.  There is  no erythema necessarily to the nailbed or concern for any regrowth or infection.  Vascular: Palpable pedal pulses bilaterally.  Localized edema noted right great toe. Capillary refill within normal limits.  Neurological: Epicritic and protective threshold grossly intact bilaterally.   Musculoskeletal Exam: Increased edema with pain on palpation around the IPJ of the right great toe  Radiographic Exam:  Normal osseous mineralization. Joint spaces preserved.  There does appear to be some multicystic lesions to the distal proximal phalanx around the IPJ of the toe  Assessment: 1. Pain in RT great toe 2. Multi-cystic bone lesions RT great toe   Plan of Care:  1. Patient evaluated. X-Rays reviewed.  2. MRI ordered RT great toe to evaluate the cystic lesions in the phalanges 3. RTC after MRI to review results and discuss further treatment options       Edrick Kins, DPM [facility]  Dr. Edrick Kins, DPM    [address], [place] [address]                Office (  [phone]  Fax [phone]

## 2021-03-31 DIAGNOSIS — Z9884 Bariatric surgery status: Secondary | ICD-10-CM | POA: Diagnosis not present

## 2021-04-01 ENCOUNTER — Other Ambulatory Visit: Payer: Medicare Other

## 2021-04-03 ENCOUNTER — Telehealth: Payer: Self-pay | Admitting: Podiatry

## 2021-04-03 ENCOUNTER — Ambulatory Visit
Admission: RE | Admit: 2021-04-03 | Discharge: 2021-04-03 | Disposition: A | Discharge status: Home / Self Care | Payer: Medicare Other | Source: Ambulatory Visit | Attending: Podiatry | Admitting: Podiatry

## 2021-04-03 ENCOUNTER — Other Ambulatory Visit: Payer: Self-pay

## 2021-04-03 DIAGNOSIS — M7989 Other specified soft tissue disorders: Secondary | ICD-10-CM | POA: Diagnosis not present

## 2021-04-03 DIAGNOSIS — M85679 Other cyst of bone, unspecified ankle and foot: Secondary | ICD-10-CM

## 2021-04-03 DIAGNOSIS — M19071 Primary osteoarthritis, right ankle and foot: Secondary | ICD-10-CM | POA: Diagnosis not present

## 2021-04-03 DIAGNOSIS — M1288 Other specific arthropathies, not elsewhere classified, other specified site: Secondary | ICD-10-CM | POA: Diagnosis not present

## 2021-04-03 DIAGNOSIS — Z872 Personal history of diseases of the skin and subcutaneous tissue: Secondary | ICD-10-CM | POA: Diagnosis not present

## 2021-04-03 NOTE — Telephone Encounter (Signed)
Truman Hayward from [facility] called to report on the following patient and has requested return call at the number listed   [phone]

## 2021-04-05 ENCOUNTER — Ambulatory Visit: Payer: Medicare Other | Admitting: Podiatry

## 2021-04-05 ENCOUNTER — Other Ambulatory Visit: Payer: Self-pay

## 2021-04-05 ENCOUNTER — Telehealth: Payer: Self-pay | Admitting: Urology

## 2021-04-05 DIAGNOSIS — M85679 Other cyst of bone, unspecified ankle and foot: Secondary | ICD-10-CM | POA: Diagnosis not present

## 2021-04-05 DIAGNOSIS — R600 Localized edema: Secondary | ICD-10-CM

## 2021-04-05 NOTE — Telephone Encounter (Signed)
DOS - 04/06/21  BONE BIOPSY ---- 20240  [facility] EFFECTIVE DATE - 11/26/20   PLAN DEDUCTIBLE - $0.00  W/ $0.00  REMAINING OUT OF POCKET - $4,500.00 W/  $4,065.86 REMAINING  COINSURANCE - 0% COPAY -  $325   PER [employer] WEBSITE FOR CPT CODE 61518 Notification or Prior Authorization is not required for the requested services  Decision ID #:[id number]

## 2021-04-06 ENCOUNTER — Other Ambulatory Visit: Payer: Self-pay | Admitting: Podiatry

## 2021-04-06 DIAGNOSIS — D480 Neoplasm of uncertain behavior of bone and articular cartilage: Secondary | ICD-10-CM | POA: Diagnosis not present

## 2021-04-06 DIAGNOSIS — M898X7 Other specified disorders of bone, ankle and foot: Secondary | ICD-10-CM | POA: Diagnosis not present

## 2021-04-06 DIAGNOSIS — M86671 Other chronic osteomyelitis, right ankle and foot: Secondary | ICD-10-CM | POA: Diagnosis not present

## 2021-04-06 MED ORDER — HYDROCODONE-ACETAMINOPHEN 5-325 MG PO TABS: 1.0000 | ORAL_TABLET | ORAL | 0 refills | Status: DC | PRN

## 2021-04-06 NOTE — Progress Notes (Signed)
PRN postop 

## 2021-04-09 ENCOUNTER — Other Ambulatory Visit: Payer: Medicare Other

## 2021-04-12 ENCOUNTER — Ambulatory Visit: Payer: Medicare Other | Admitting: Podiatry

## 2021-04-12 ENCOUNTER — Encounter: Payer: Self-pay | Admitting: Family Medicine

## 2021-04-12 ENCOUNTER — Ambulatory Visit (INDEPENDENT_AMBULATORY_CARE_PROVIDER_SITE_OTHER): Payer: Medicare Other | Admitting: Family Medicine

## 2021-04-12 ENCOUNTER — Ambulatory Visit (INDEPENDENT_AMBULATORY_CARE_PROVIDER_SITE_OTHER): Payer: Medicare Other | Admitting: Podiatry

## 2021-04-12 ENCOUNTER — Other Ambulatory Visit: Payer: Self-pay

## 2021-04-12 VITALS — BP 112/66 | HR 60 | Temp 98.1°F | Ht 62.5 in | Wt 179.2 lb

## 2021-04-12 DIAGNOSIS — R21 Rash and other nonspecific skin eruption: Secondary | ICD-10-CM | POA: Diagnosis not present

## 2021-04-12 DIAGNOSIS — Z9889 Other specified postprocedural states: Secondary | ICD-10-CM

## 2021-04-12 DIAGNOSIS — M79662 Pain in left lower leg: Secondary | ICD-10-CM | POA: Diagnosis not present

## 2021-04-12 DIAGNOSIS — M85679 Other cyst of bone, unspecified ankle and foot: Secondary | ICD-10-CM

## 2021-04-12 NOTE — Progress Notes (Signed)
   Subjective:  Patient presents today status post bone biopsy right great toe proximal phalanx due to concern of possible osteomyelitis. DOS: 04/06/2021.  Patient states that she is doing well.  She has no pain associated to the toe.  No new complaints at this time  Past Medical History:  Diagnosis Date  . Anemia    previously followed by Dr. Jamse Arn for anemia and elevated platelets  . Anxiety   . C. difficile colitis 10/01/12   treated by [facility]  . Chronic fatigue syndrome   . DDD (degenerative disc disease), lumbar 08/19/14   and facet arthroplasty & left lumbar radiculopathy (Dr.Ramos)  . Depression   . Dyssynergia    dyssynergenic defecation, contributing to fecal incontinence.  . Edema   . Fibromyalgia   . Gastroparesis    followed at [facility]  . GERD (gastroesophageal reflux disease)   . History of kidney stones   . Hyperlipidemia   . Kidney stone   . Lumbar radiculopathy   . Migraine   . Neuropathy   . Obstructive sleep apnea    Does  wear  CPAP  . Paresthesia    Dr. Everette Rank at [facility]  . Pelvic floor dysfunction    pelvic floor dyssynergy  . Plantar fasciitis 02/2011   R foot  . Pneumonia    2012  . PONV (postoperative nausea and vomiting)    pt states has gastroparesis has difficulty taking antibiotics and narcotics has severe nausea and vomiting   . Restless leg syndrome   . S/P endometrial ablation 08/09/2006   Novasure Ablation  . S/P epidural steroid injection 09/20/14   Dr.Ramos  . Tremor    Dr. Everette Rank  . Urinary frequency   . Urinary incontinence       Objective/Physical Exam Neurovascular status intact.  Small skin incision appears to be well coapted with sutures intact. No sign of infectious process noted. No dehiscence. No active bleeding noted.  Edema noted localized to the right hallux which has been present for several months  Assessment: 1. s/p bone biopsy proximal phalanx right hallux. DOS: 04/06/2021   Plan of Care:  1. Patient  was evaluated.  2.  Sutures removed 3.  Continue wearing good supportive shoes 4.  Surgical pathology from the bone biopsy is pending.  Today we called the pathology office and was told that the report is still pending. 5.  We will call the patient to review results over the phone 6.  Return to clinic as needed   Edrick Kins, DPM [facility]  Dr. Edrick Kins, DPM    [address], [place] [address]                Office [phone]  Fax [phone]

## 2021-04-12 NOTE — Patient Instructions (Addendum)
Keep the leg very well moisturized, at least 2-3 times/day (Cetaphil is fine). You can continue to use hydrocortisone twice daily if needed for any itching. I recommend wearing compression socks (when inside in air conditioning), if prolonged sitting and standing (not if leg is elevated).  Continue zyrtec daily. Change your razor blade (to be on the safe side)  Use heat or ice if the pain recurs.  Use tylenol sparingly, if needed.  If there is any change, spread or other concern, please reach out. You can send additional photos through [place].

## 2021-04-12 NOTE — Progress Notes (Signed)
Chief Complaint  Patient presents with  . Rash    Woke up this am and left calf area has a rash on it, not itching-is uncomfortable and burning. Put some cortisone cream on it and that made it burn more. Took a benadryl also.    She woke up fine, but later this morning she noticed pain at the left calf.  She then saw that it was red and very tender.  She used hydrocortisone cream (which stung/burned), benadryl and took Tylenol.  She reports pain is better, the rash isn't as red as it was earlier.  She shaved legs yesterday (shaved both legs, no rash on the right).  No other change in topical contacts (lotions, sunscreen, etc).  She hasn't been in the sun.  No known insect bites or tick. No new topical contacts or exposures.   PMH, PSH, SH reviewed  Outpatient Encounter Medications as of 04/12/2021  Medication Sig Note  . acetaminophen (TYLENOL) 500 MG tablet Take 1,000 mg by mouth 3 (three) times daily as needed for moderate pain.   Marland Kitchen allopurinol (ZYLOPRIM) 300 MG tablet TAKE 1 TABLET BY MOUTH EVERY DAY   . ALPRAZolam (XANAX) 0.25 MG tablet Take 1 tablet (0.25 mg total) by mouth 3 (three) times daily as needed for anxiety.   . ARIPiprazole (ABILIFY) 5 MG tablet Take 1 tablet (5 mg total) by mouth daily.   . Armodafinil 250 MG tablet TAKE 1 TABLET BY MOUTH EVERY DAY WITH BREAKFAST   . atorvastatin (LIPITOR) 40 MG tablet Take 1 tablet (40 mg total) by mouth daily.   Marland Kitchen CALCIUM PO Take 500 mg by mouth in the morning, at noon, and at bedtime.   . cetirizine (ZYRTEC) 10 MG tablet Take 10 mg by mouth at bedtime.   . DULoxetine (CYMBALTA) 60 MG capsule Take 1 capsule (60 mg total) by mouth 2 (two) times daily.   . fluticasone (FLONASE) 50 MCG/ACT nasal spray PLACE 2 SPRAYS INTO BOTH NOSTRILS DAILY AS NEEDED FOR ALLERGIES   . gabapentin (NEURONTIN) 300 MG capsule Take 300 mg by mouth 3 (three) times daily.   . Melatonin 10 MG TABS Take 5 mg by mouth at bedtime.    . methocarbamol (ROBAXIN) 500 MG  tablet TAKE 1 TABLET BY MOUTH EVERY DAY AS NEEDED   . metoprolol tartrate (LOPRESSOR) 50 MG tablet TAKE 1 TABLET BY MOUTH TWICE A DAY   . Multiple Vitamins-Minerals (BARIATRIC MULTIVITAMINS/IRON PO) Take 2 each by mouth daily.   . Multiple Vitamins-Minerals (ICAPS AREDS 2) CAPS Take 1 capsule by mouth 2 (two) times daily.   . nortriptyline (PAMELOR) 50 MG capsule Take 150 mg by mouth at bedtime.   . OXcarbazepine (TRILEPTAL) 150 MG tablet Take 150 mg by mouth 2 (two) times daily.   . pantoprazole (PROTONIX) 40 MG tablet Take 1 tablet (40 mg total) by mouth daily.   . Probiotic Product ([place]) CAPS Take 1 capsule by mouth at bedtime.    Marland Kitchen Propylene Glycol 0.6 % SOLN Place 1 drop into both eyes daily at 12 noon.    . RESTASIS 0.05 % ophthalmic emulsion 1 drop 2 (two) times daily.   . [DISCONTINUED] OXcarbazepine (TRILEPTAL) 150 MG tablet Take 1 tablet by mouth 2 (two) times daily.   Marland Kitchen amoxicillin (AMOXIL) 500 MG capsule Take 1,000 mg by mouth 2 (two) times daily. (Patient not taking: No sig reported) 01/26/2021: Take 2 tablets one hour prior to dentist appt and 2 tablets one hour after.  Marland Kitchen  colchicine 0.6 MG tablet TAKE 1 TABLET BY MOUTH DAILY AS NEEDED DURING GOUT FLARES. (Patient not taking: No sig reported)   . HYDROcodone-acetaminophen (NORCO/VICODIN) 5-325 MG tablet Take 1 tablet by mouth every 4 (four) hours as needed for moderate pain. (Patient not taking: Reported on 04/12/2021)   . meclizine (ANTIVERT) 25 MG tablet Take 1 tablet (25 mg total) by mouth 3 (three) times daily as needed for dizziness. (Patient not taking: No sig reported)   . ondansetron (ZOFRAN) 8 MG tablet Take 8 mg by mouth every 8 (eight) hours as needed for nausea. (Patient not taking: No sig reported) 05/02/2020: Uses prn, very rarely  . prochlorperazine (COMPAZINE) 10 MG tablet Take 10 mg by mouth as needed. (Patient not taking: No sig reported)   . promethazine (PHENERGAN) 25 MG tablet Take 25 mg by mouth as  needed. (Patient not taking: No sig reported)    No facility-administered encounter medications on file as of 04/12/2021.   Allergies  Allergen Reactions  . Erythromycin Nausea Only    Abdominal pain  . Tramadol Itching  . Dilaudid [Hydromorphone Hcl] Itching    ROS:  No fever, chills, URI symptoms, swelling. Recent bone biopsy on R great toe last week. See HPI  PHYSICAL EXAM:  BP 112/66   Pulse 60   Temp 98.1 F (36.7 C) (Tympanic)   Ht 5' 2.5" (1.588 m)   Wt 179 lb 3.2 oz (81.3 kg)   LMP 07/27/2006   BMI 32.25 kg/m   Wt Readings from Last 3 Encounters:  04/12/21 179 lb 3.2 oz (81.3 kg)  03/01/21 172 lb 4.8 oz (78.2 kg)  01/26/21 175 lb 12.8 oz (79.7 kg)   She is wearing postop shoe on the R foot. Well-appearing female in no distress HEENT: conjunctiva and sclera are normal, wearing mask  Extremities: There are areas of erythema at the posterior calf, mainly laterally, and at the back of the leg, seeming to have a vertical extension (up and down the back of the calf). There is mild erythema, no significant redness/warmth, there is one small scab (likely related to shaving) without any crusting. No visible streaks.  Skin is somewhat rough/dry.  +varicose veins in the LLE. Rash starts just posterior to the lateral VV No cords, nontender over the varicose veins. Only minimal tenderness over inferolateral portion of gastroc muscle. No edema  ASSESSMENT/PLAN:  Pain of left calf - earlier this morning, now resolved.  no e/o DVT or superficial thrombophlebitis, no cellulitis. Reassured. consider compression socks  Rash - on posterior L calf.  This has improved a lot since taking benadryl and using HC cream. To keep well moisturized, and observe carefully for now. no e/o infxn    Keep the leg very well moisturized, at least 2-3 times/day (Cetaphil is fine). You can continue to use hydrocortisone twice daily if needed for any itching. I recommend wearing compression  socks (when inside in air conditioning), if prolonged sitting and standing (not if leg is elevated).  Continue zyrtec daily.  Use heat or ice if the pain recurs.  Use tylenol sparingly, if needed.  If there is any change, spread or other concern, please reach out. You can send additional photos through [place].

## 2021-04-14 ENCOUNTER — Telehealth: Payer: Self-pay | Admitting: Podiatry

## 2021-04-14 NOTE — Telephone Encounter (Signed)
Patient called in expressing concerns regarding MRI results, stated she was told she would receive phone call and hasn't. I informed patient she has appointment on 04/19/2021 and if she likes I can edit appointment notes for [place] to go over during POST OP appointment, Patient stated she is concerned and doesn't want to wait until Wednesday for those results and has requested return call for over read, Please Advise

## 2021-04-14 NOTE — Telephone Encounter (Signed)
We may need to call the surgery center and get the bone biopsy results from them. - Dr. Amalia Hailey

## 2021-04-14 NOTE — Telephone Encounter (Signed)
Estill Bamberg,  Can you please follow up again with pathology for this patient's bone biopsy? Not MRI results, we are waiting for bone biopsy results.  Thanks, Dr. Amalia Hailey

## 2021-04-17 ENCOUNTER — Telehealth: Payer: Self-pay | Admitting: Podiatry

## 2021-04-17 NOTE — Telephone Encounter (Signed)
Patient called and stated she need the results from her bone biopsy. Please call patient

## 2021-04-18 ENCOUNTER — Telehealth: Payer: Self-pay | Admitting: Podiatry

## 2021-04-18 NOTE — Telephone Encounter (Signed)
Spoke with the patient today on the phone regarding the results from the bone biopsy right great toe 04/06/2021:  - Explained to the patient that there was no active inflammation or malignancy identified.  We will simply observe for now - Return to clinic in 6 months for follow-up x-ray  [name], DPM [facility]  Dr. Edrick [name], DPM    [address], [place] [address]                Office [phone]  Fax [phone]

## 2021-04-19 ENCOUNTER — Encounter: Payer: Medicare Other | Admitting: Podiatry

## 2021-04-21 ENCOUNTER — Encounter: Payer: Self-pay | Admitting: Podiatry

## 2021-04-24 NOTE — Progress Notes (Signed)
HPI: 65 y.o. female presenting today for continued pain and tenderness to the right great toe.  Patient states that approximately 6 months ago she had total permanent nail matricectomy to the right great toe but this did not alleviate her symptoms.  She would like to have it evaluated further.  She states that she continues to have swelling to the right great toe  Past Medical History:  Diagnosis Date  . Anemia    previously followed by Dr. Jamse Arn for anemia and elevated platelets  . Anxiety   . C. difficile colitis 10/01/12   treated by [facility]  . Chronic fatigue syndrome   . DDD (degenerative disc disease), lumbar 08/19/14   and facet arthroplasty & left lumbar radiculopathy (Dr.Ramos)  . Depression   . Dyssynergia    dyssynergenic defecation, contributing to fecal incontinence.  . Edema   . Fibromyalgia   . Gastroparesis    [facility] at [facility]  . GERD (gastroesophageal reflux disease)   . History of kidney stones   . Hyperlipidemia   . Kidney stone   . Lumbar radiculopathy   . Migraine   . Neuropathy   . Obstructive sleep apnea    Does  wear  CPAP  . Paresthesia    Dr. Everette Rank at [facility]  . Pelvic floor dysfunction    pelvic floor dyssynergy  . Plantar fasciitis 02/2011   R foot  . Pneumonia    2012  . PONV (postoperative nausea and vomiting)    pt states has gastroparesis has difficulty taking antibiotics and narcotics has severe nausea and vomiting   . Restless leg syndrome   . S/P endometrial ablation 08/09/2006   Novasure Ablation  . S/P epidural steroid injection 09/20/14   Dr.Ramos  . Tremor    Dr. Everette Rank  . Urinary frequency   . Urinary incontinence      Physical Exam: General: The patient is alert and oriented x3 in no acute distress.  Dermatology: Skin is warm, dry and supple bilateral lower extremities. Negative for open lesions or macerations.  Absence of the nail plate noted right great toe with associated tenderness to palpation.  There is  no erythema necessarily to the nailbed or concern for any regrowth or infection.  Vascular: Palpable pedal pulses bilaterally.  Localized edema noted right great toe. Capillary refill within normal limits.  Neurological: Epicritic and protective threshold grossly intact bilaterally.   Musculoskeletal Exam: Increased edema with pain on palpation around the IPJ of the right great toe  MRI impression 04/03/2021 RT toes w/o contrast: 1. Prominent bone marrow edema throughout the proximal and distal phalanx of the right great toe with associated intermediate-low T1 marrow signal. Circumscribed cystic changes centered at the great toe IP joint, most pronounced within the proximal phalanx. Findings are most suspicious for septic arthritis of the great toe IP joint and acute osteomyelitis involving the proximal and distal phalanx of the great toe. A concurrent inflammatory or crystalline arthropathy such as gout could potentially account for some of the imaging features, although findings remain most suspicious for infection. There is soft tissue fullness surrounding the great toe IP joint and extending into the region of the nail bed, likely inflammatory/infectious. 2. Subacute to chronic-appearing nondisplaced fracture of the fifth metatarsal base extending into the fifth TMT joint. 3. Lateral collateral ligament of the great toe IP joint is torn at its proximal attachment. 4. Moderate arthropathy within the midfoot, most pronounced at the second third and fourth TMT joints. Mild  first MTP joint osteoarthritis.  Assessment: 1.  Edema RT great toe 2. Multi-cystic bone lesions RT great toe   Plan of Care:  1. Patient evaluated. X-Rays reviewed.  2. Today we discussed the conservative versus surgical management of the presenting pathology.  Recommend that we take a bone biopsy of the right great toe to rule out infection versus inflammatory reaction.  Patient agrees. All possible  complications and details of the procedure were explained. All patient questions were answered. No guarantees were expressed or implied. 3. Authorization for surgery was initiated today. Surgery will consist of bone biopsy RT hallux 4.  Return to clinic 1 week postop     Edrick Kins, DPM [facility]  Dr. Edrick Kins, DPM    [address], [place] [address]                Office [phone]  Fax [phone]

## 2021-04-26 ENCOUNTER — Telehealth: Payer: Self-pay | Admitting: *Deleted

## 2021-04-26 ENCOUNTER — Encounter: Payer: Self-pay | Admitting: Cardiology

## 2021-04-26 ENCOUNTER — Ambulatory Visit: Payer: Medicare Other | Admitting: Cardiology

## 2021-04-26 ENCOUNTER — Other Ambulatory Visit: Payer: Self-pay

## 2021-04-26 VITALS — BP 110/70 | HR 92 | Ht 62.5 in | Wt 179.6 lb

## 2021-04-26 DIAGNOSIS — G4733 Obstructive sleep apnea (adult) (pediatric): Secondary | ICD-10-CM

## 2021-04-26 DIAGNOSIS — I10 Essential (primary) hypertension: Secondary | ICD-10-CM | POA: Diagnosis not present

## 2021-04-26 DIAGNOSIS — E66812 Obesity, class 2: Secondary | ICD-10-CM

## 2021-04-26 NOTE — Progress Notes (Signed)
Date:  04/26/2021   ID:  [name], DOB [birth year], MRN [id number]   PCP:  Rita Ohara, MD  Cardiologist:  Jenkins Rouge, MD  Sleep Medicine:  Fransico Him, MD Electrophysiologist:  None   Chief Complaint:  OSA  History of Present Illness:    [name] is a 65 y.o. female with a hx of chest pain with normal heart cath, mild pulmonary HTN at cath with normal LVEDP followed by Dr. Johnsie Cancel.  She also has obesity and OSA on CPAP which I follow.    She is doing well with her CPAP device and thinks that she has gotten used to it.  She is having a hard time getting her hours in and thinks she takes it off during the night. She tolerates the nasal pillow mask and feels the pressure is adequate.  Since going on CPAP she feels rested in the am and has no significant daytime sleepiness if she slept well the night before.  She has had some problems with mouth dryness but no significant mouth or nasal dryness or nasal congestion.  She does not think that she snores.    Prior CV studies:   The following studies were reviewed today:  PAP compliance download  Past Medical History:  Diagnosis Date  . Anemia    previously followed by Dr. Jamse Arn for anemia and elevated platelets  . Anxiety   . C. difficile colitis 10/01/12   treated by [facility]  . Chronic fatigue syndrome   . DDD (degenerative disc disease), lumbar 08/19/14   and facet arthroplasty & left lumbar radiculopathy (Dr.Ramos)  . Depression   . Dyssynergia    dyssynergenic defecation, contributing to fecal incontinence.  . Edema   . Fibromyalgia   . Gastroparesis    followed at [facility]  . GERD (gastroesophageal reflux disease)   . History of kidney stones   . Hyperlipidemia   . Kidney stone   . Lumbar radiculopathy   . Migraine   . Neuropathy   . Obstructive sleep apnea    Does  wear  CPAP  . Paresthesia    Dr. Everette Rank at [facility]  . Pelvic floor dysfunction    pelvic floor dyssynergy  . Plantar fasciitis 02/2011    R foot  . Pneumonia    2012  . PONV (postoperative nausea and vomiting)    pt states has gastroparesis has difficulty taking antibiotics and narcotics has severe nausea and vomiting   . Restless leg syndrome   . S/P endometrial ablation 08/09/2006   Novasure Ablation  . S/P epidural steroid injection 09/20/14   Dr.Ramos  . Tremor    Dr. Everette Rank  . Urinary frequency   . Urinary incontinence    Past Surgical History:  Procedure Laterality Date  . CHOLECYSTECTOMY  9/05  . ENDOMETRIAL ABLATION  08/09/2006   Dr. Valentina Shaggy Ablation  . FACET JOINT INJECTION  04/17/2017   Left L4-5 and L5-S1  . GASTRIC ROUX-EN-Y N/A 09/29/2019   Procedure: LAPAROSCOPIC ROUX-EN-Y GASTRIC BYPASS WITH UPPER ENDOSCOPY, ERAS Pathway;  Surgeon: Johnathan Hausen, MD;  Location: [facility];  Service: General;  Laterality: N/A;  . KNEE ARTHROPLASTY    . KNEE SURGERY  1999   R knee, Dr. Eddie Dibbles, torn cartilage  . RETINAL LASER PROCEDURE Right 08/28/2018   laser retinopexy  . RIGHT/LEFT HEART CATH AND CORONARY ANGIOGRAPHY N/A 01/01/2018   Procedure: RIGHT/LEFT HEART CATH AND CORONARY ANGIOGRAPHY;  Surgeon: Sherren Mocha, MD;  Location: [place] [facility];  Service: Cardiovascular;  Laterality: N/A;  . TONSILLECTOMY  1968  . TONSILLECTOMY    . TOTAL KNEE ARTHROPLASTY Right 09/05/2015   Procedure: RIGHT TOTAL KNEE ARTHROPLASTY;  Surgeon: Gaynelle Arabian, MD;  Location: [facility];  Service: Orthopedics;  Laterality: Right;  . TOTAL KNEE ARTHROPLASTY Left 07/14/2018   Procedure: LEFT TOTAL KNEE ARTHROPLASTY;  Surgeon: Gaynelle Arabian, MD;  Location: [facility];  Service: Orthopedics;  Laterality: Left;  . ULTRASOUND GUIDANCE FOR VASCULAR ACCESS  01/01/2018   Procedure: Ultrasound Guidance For Vascular Access;  Surgeon: Sherren Mocha, MD;  Location: [place] [facility];  Service: Cardiovascular;;  . UMBILICAL HERNIA REPAIR N/A 09/29/2019   Procedure: HERNIA REPAIR UMBILICAL ADULT;  Surgeon: Johnathan Hausen, MD;  Location: [facility];  Service: General;  Laterality: N/A;     Current Meds  Medication Sig  . acetaminophen (TYLENOL) 500 MG tablet Take 1,000 mg by mouth every 8 (eight) hours as needed for moderate pain.  Marland Kitchen allopurinol (ZYLOPRIM) 300 MG tablet TAKE 1 TABLET BY MOUTH EVERY DAY  . ALPRAZolam (XANAX) 0.25 MG tablet Take 1 tablet (0.25 mg total) by mouth 3 (three) times daily as needed for anxiety.  Marland Kitchen amoxicillin (AMOXIL) 500 MG capsule Take 1,000 mg by mouth 2 (two) times daily.  . ARIPiprazole (ABILIFY) 5 MG tablet Take 1 tablet (5 mg total) by mouth daily.  . Armodafinil 250 MG tablet TAKE 1 TABLET BY MOUTH EVERY DAY WITH BREAKFAST  . atorvastatin (LIPITOR) 40 MG tablet Take 1 tablet (40 mg total) by mouth daily.  Marland Kitchen CALCIUM PO Take 500 mg by mouth in the morning, at noon, and at bedtime.  . cetirizine (ZYRTEC) 10 MG tablet Take 10 mg by mouth at bedtime.  . colchicine 0.6 MG tablet TAKE 1 TABLET BY MOUTH DAILY AS NEEDED DURING GOUT FLARES.  Marland Kitchen diphenhydrAMINE (BENADRYL) 25 MG tablet Take 25 mg by mouth every 6 (six) hours as needed.  . DULoxetine (CYMBALTA) 60 MG capsule Take 1 capsule (60 mg total) by mouth 2 (two) times daily.  . fluticasone (FLONASE) 50 MCG/ACT nasal spray PLACE 2 SPRAYS INTO BOTH NOSTRILS DAILY AS NEEDED FOR ALLERGIES  . gabapentin (NEURONTIN) 300 MG capsule Take 300 mg by mouth 3 (three) times daily.  Marland Kitchen HYDROcodone-acetaminophen (NORCO/VICODIN) 5-325 MG tablet Take 1 tablet by mouth every 4 (four) hours as needed for moderate pain.  . meclizine (ANTIVERT) 25 MG tablet Take 1 tablet (25 mg total) by mouth 3 (three) times daily as needed for dizziness.  . Melatonin 10 MG TABS Take 5 mg by mouth at bedtime.   . methocarbamol (ROBAXIN) 500 MG tablet TAKE 1 TABLET BY MOUTH EVERY DAY AS NEEDED  . metoprolol tartrate (LOPRESSOR) 50 MG tablet TAKE 1 TABLET BY MOUTH TWICE A DAY  . Multiple Vitamins-Minerals (BARIATRIC MULTIVITAMINS/IRON PO) Take 2 each by mouth daily.  . Multiple  Vitamins-Minerals (ICAPS AREDS 2) CAPS Take 1 capsule by mouth 2 (two) times daily.  . nortriptyline (PAMELOR) 50 MG capsule Take 150 mg by mouth at bedtime.  . ondansetron (ZOFRAN) 8 MG tablet Take 8 mg by mouth every 8 (eight) hours as needed for nausea.  . OXcarbazepine (TRILEPTAL) 150 MG tablet Take 150 mg by mouth 2 (two) times daily.  . pantoprazole (PROTONIX) 40 MG tablet Take 1 tablet (40 mg total) by mouth daily.  . Probiotic Product ([place]) CAPS Take 1 capsule by mouth at bedtime.   . prochlorperazine (COMPAZINE) 10 MG tablet Take 10 mg by  mouth as needed.  . promethazine (PHENERGAN) 25 MG tablet Take 25 mg by mouth as needed.  Marland Kitchen Propylene Glycol 0.6 % SOLN Place 1 drop into both eyes daily at 12 noon.   . RESTASIS 0.05 % ophthalmic emulsion 1 drop 2 (two) times daily.     Allergies:   Erythromycin, Tramadol, and Dilaudid [hydromorphone hcl]   Social History   Tobacco Use  . Smoking status: Never Smoker  . Smokeless tobacco: Never Used  Vaping Use  . Vaping Use: Never used  Substance Use Topics  . Alcohol use: No    Alcohol/week: 0.0 standard drinks  . Drug use: No     Family Hx: The patient's family history includes Allergies in her mother and sister; Asthma in her sister; Cancer in her maternal aunt and maternal aunt; Colon cancer in her maternal aunt; Diabetes in her father; Heart disease in her father, maternal grandfather, maternal grandmother, mother, paternal grandfather, and paternal grandmother; Hypertension in her father and mother; Irritable bowel syndrome in her sister; Kidney disease in her father; Macular degeneration in her mother; Stroke (age of onset: 14) in her father. There is no history of CAD.  ROS:   Please see the history of present illness.     All other systems reviewed and are negative.   Labs/Other Tests and Data Reviewed:    Recent Labs: 01/22/2021: Magnesium 2.1; TSH 2.406 01/26/2021: ALT 59; BUN 22; Creatinine, Ser 0.94;  Hemoglobin 13.0; Platelets 306; Potassium 5.0; Sodium 143   Recent Lipid Panel Lab Results  Component Value Date/Time   CHOL 118 01/22/2021 04:57 AM   CHOL 116 05/02/2020 11:16 AM   TRIG 97 01/22/2021 04:57 AM   HDL 45 01/22/2021 04:57 AM   HDL 41 05/02/2020 11:16 AM   CHOLHDL 2.6 01/22/2021 04:57 AM   LDLCALC 54 01/22/2021 04:57 AM   LDLCALC 50 05/02/2020 11:16 AM   LDLCALC 68 10/03/2017 10:20 AM    Wt Readings from Last 3 Encounters:  04/26/21 179 lb 9.6 oz (81.5 kg)  04/12/21 179 lb 3.2 oz (81.3 kg)  03/01/21 172 lb 4.8 oz (78.2 kg)     Objective:    Vital Signs:  BP 110/70   Pulse 92   Ht 5' 2.5" (1.588 m)   Wt 179 lb 9.6 oz (81.5 kg)   LMP 07/27/2006   SpO2 96%   BMI 32.33 kg/m    GEN: Well nourished, well developed in no acute distress HEENT: Normal NECK: No JVD; No carotid bruits LYMPHATICS: No lymphadenopathy CARDIAC:RRR, no murmurs, rubs, gallops RESPIRATORY:  Clear to auscultation without rales, wheezing or rhonchi  ABDOMEN: Soft, non-tender, non-distended MUSCULOSKELETAL:  No edema; No deformity  SKIN: Warm and dry NEUROLOGIC:  Alert and oriented x 3 PSYCHIATRIC:  Normal affect    ASSESSMENT & PLAN:    1.  OSA - The patient is tolerating PAP therapy well without any problems. The PAP download performed by his DME was personally reviewed and interpreted by me today and showed an AHI of 6.8/hr on auto PAP  with 40% compliance in using more than 4 hours nightly.  The patient has been using and benefiting from PAP use and will continue to benefit from therapy.  -I will order a chin strap to help with mouth dryness and likely adding to elevated AHI from mouth breathing -I encouraged her to be more compliant with her device -continue prescription management of her CPAP device>>I have placed an order with the DME for supplies for 1  year -get download in 4 weeks after she gets her chin strap  2.  Obesity -She is s/p weight loss surgery  and has lost 60lbs but  gained 5lbs back -she is in the bariatric exercise program  3.  HTN -BP controlled on exam today -Continue prescription drug management with Lopressor 50mg  BID   Medication Adjustments/Labs and Tests Ordered: Current medicines are reviewed at length with the patient today.  Concerns regarding medicines are outlined above.  Tests Ordered: No orders of the defined types were placed in this encounter.  Medication Changes: No orders of the defined types were placed in this encounter.   Disposition:  Follow up in 1 year(s)  Signed, Fransico Him, MD  04/26/2021 8:43 AM     [facility]

## 2021-04-26 NOTE — Telephone Encounter (Signed)
Order placed to [facility] for new chin strap along with supplies

## 2021-04-26 NOTE — Telephone Encounter (Signed)
-----   Message from Antonieta Iba, RN sent at 04/26/2021  9:03 AM EDT ----- Please order the patient a new chin strap along with supplies for one year. Please get a download in 4 weeks.  Thanks!

## 2021-04-26 NOTE — Patient Instructions (Signed)
Medication Instructions:  Your physician recommends that you continue on your current medications as directed. Please refer to the Current Medication list given to you today.  *If you need a refill on your cardiac medications before your next appointment, please call your pharmacy*  Follow-Up: At [facility], you and your health needs are our priority.  As part of our continuing mission to provide you with exceptional heart care, we have created designated Provider Care Teams.  These Care Teams include your primary Cardiologist (physician) and Advanced Practice Providers (APPs -  Physician Assistants and Nurse Practitioners) who all work together to provide you with the care you need, when you need it.    Your next appointment:   1 year(s)  The format for your next appointment:   In Person  Provider:   Fransico Him, MD   Other Instructions Dr. Radford Pax has ordered a new chin strap as well as supplies for one year. She will get a download to review in 4 weeks.

## 2021-04-27 ENCOUNTER — Other Ambulatory Visit: Payer: Self-pay | Admitting: Family Medicine

## 2021-04-27 DIAGNOSIS — K2100 Gastro-esophageal reflux disease with esophagitis, without bleeding: Secondary | ICD-10-CM | POA: Diagnosis not present

## 2021-04-27 DIAGNOSIS — E785 Hyperlipidemia, unspecified: Secondary | ICD-10-CM

## 2021-04-27 DIAGNOSIS — R112 Nausea with vomiting, unspecified: Secondary | ICD-10-CM | POA: Diagnosis not present

## 2021-04-28 NOTE — Telephone Encounter (Signed)
Has an appointment in august

## 2021-04-29 ENCOUNTER — Other Ambulatory Visit: Payer: Self-pay | Admitting: Rheumatology

## 2021-04-30 ENCOUNTER — Other Ambulatory Visit: Payer: Self-pay | Admitting: Physician Assistant

## 2021-05-01 ENCOUNTER — Ambulatory Visit: Payer: Medicare Other | Admitting: Skilled Nursing Facility1

## 2021-05-01 NOTE — Telephone Encounter (Signed)
Next Visit:07/03/2021  Last Visit:01/02/2021  Current Dose per office note on2/05/2021,Robaxin on as needed basis  Last Fill: 03/23/2021  Dx: Fibromyalgia  Okay to refill Robaxin?

## 2021-05-01 NOTE — Telephone Encounter (Signed)
Next Visit: 07/03/2021  Last Visit: 01/02/2021  Current Dose per office note on 01/02/2021, Nuvigilfor fatigue Dx: Fatigue  Last Fill: 02/01/2021  Okay to refill Armodafinil?

## 2021-05-03 ENCOUNTER — Encounter: Payer: Medicare Other | Admitting: Podiatry

## 2021-05-09 ENCOUNTER — Ambulatory Visit: Payer: Medicare Other | Admitting: Adult Health

## 2021-05-15 DIAGNOSIS — R278 Other lack of coordination: Secondary | ICD-10-CM | POA: Diagnosis not present

## 2021-05-15 DIAGNOSIS — R202 Paresthesia of skin: Secondary | ICD-10-CM | POA: Diagnosis not present

## 2021-05-23 ENCOUNTER — Encounter: Payer: Self-pay | Admitting: Family Medicine

## 2021-05-23 ENCOUNTER — Telehealth (INDEPENDENT_AMBULATORY_CARE_PROVIDER_SITE_OTHER): Payer: Medicare Other | Admitting: Family Medicine

## 2021-05-23 VITALS — BP 119/68 | Temp 98.7°F | Wt 179.0 lb

## 2021-05-23 DIAGNOSIS — J40 Bronchitis, not specified as acute or chronic: Secondary | ICD-10-CM

## 2021-05-23 MED ORDER — AMOXICILLIN 875 MG PO TABS: 875.0000 mg | ORAL_TABLET | Freq: Two times a day (BID) | ORAL | 0 refills | Status: DC

## 2021-05-23 NOTE — Progress Notes (Signed)
   Subjective:    Patient ID: [name], female    DOB: 1956-10-03, 65 y.o.   MRN: [id number]  HPI Documentation for virtual audio and video telecommunications through [place] encounter: The patient was located at home. 2 patient identifiers used.  The provider was located in the office. The patient did consent to this visit and is aware of possible charges through their insurance for this visit. The other persons participating in this telemedicine service were none. Time spent on call was 5 minutes and in review of previous records >20 minutes total for counseling and coordination of care. This virtual service is not related to other E/M service within previous 7 days.  She states that roughly 10 days ago she developed sore throat, chest and nasal congestion but no fever, chills, earache.  She did state that this morning she noted the eyes were slightly red with clear discharge and the dry cough has gotten much worse.  She has had 2 negative COVID tests.  She does have allergies to erythromycin.  Review of Systems     Objective:   Physical Exam Alert and in no distress otherwise not examined       Assessment & Plan:  Bronchitis - Plan: amoxicillin (AMOXIL) 875 MG tablet Since she has had it for over 10 days I think is reasonable to treat.  If she is not making improvement, she is to call.  Recommend NyQuil for nighttime coughing and Mucinex DM during the day

## 2021-05-31 ENCOUNTER — Ambulatory Visit: Payer: Medicare Other | Admitting: Skilled Nursing Facility1

## 2021-06-10 ENCOUNTER — Other Ambulatory Visit: Payer: Self-pay | Admitting: Rheumatology

## 2021-06-12 NOTE — Telephone Encounter (Signed)
Next Visit: 07/03/2021   Last Visit: 01/02/2021   Current Dose per office note on 01/02/2021, Robaxin on as needed basis   Last Fill: 05/01/2021   Dx: Fibromyalgia   Okay to refill Robaxin?

## 2021-06-20 ENCOUNTER — Other Ambulatory Visit: Payer: Self-pay | Admitting: Family Medicine

## 2021-06-22 NOTE — Progress Notes (Signed)
Office Visit Note  Patient: [name]             Date of Birth: [birth year]           MRN: [id number]             PCP: Rita Ohara, MD Referring: Rita Ohara, MD Visit Date: 07/05/2021 Occupation: '@GUAROCC'$ @  Subjective:  Discuss DEXA/treatment options   History of Present Illness: [name] is a 65 y.o. female with history of gout and fibromyalgia.  Patient is taking allopurinol 300 mg 1 tablet by mouth daily and colchicine 0.6 mg 1 tablet by mouth daily as needed during flares.  She denies any recent gout flares.  Patient reports that about 2 and half weeks ago she had a minor fall catching herself on the wall.  She states that several days later she started to have increased back pain while at an exercise class.  She states that she had severe pain when laying down and had some difficulty getting up.  She was evaluated in the ED on 07/04/2021 for right-sided flank pain.  She had x-rays at that time which revealed chronic compression fractures and a new T8 fracture.  She had a bone density in August 2021.  She has been taking calcium 500 mg 3 times daily.  She previously had bariatric surgery in 2020.  She is naive to osteoporosis treatment.  She was given a prescription for oxycodone and Lidoderm patches at the ED yesterday for pain management.  According to the patient her orthopedist plans on ordering the MRI for further evaluation.  She states that since the fall she has been having increased pain due to fibromyalgia.  She has generalized myalgias and muscle tenderness.  She is currently having trapezius muscle tension and tenderness bilaterally.  She has been taking methocarbamol 500 mg daily as needed for muscle spasms.  She remains on Cymbalta as prescribed.     Activities of Daily Living:  Patient reports morning stiffness for 0 minutes.   Patient Denies nocturnal pain.  Difficulty dressing/grooming: Denies Difficulty climbing stairs: Denies Difficulty getting out of chair:  Denies Difficulty using hands for taps, buttons, cutlery, and/or writing: Denies  Review of Systems  Constitutional:  Positive for fatigue.  HENT:  Positive for mouth dryness and nose dryness. Negative for mouth sores.   Eyes:  Positive for dryness. Negative for pain and itching.  Respiratory:  Negative for shortness of breath and difficulty breathing.   Cardiovascular:  Negative for chest pain and palpitations.  Gastrointestinal:  Positive for constipation. Negative for blood in stool and diarrhea.  Endocrine: Negative for increased urination.  Genitourinary:  Negative for difficulty urinating.  Musculoskeletal:  Positive for myalgias, muscle tenderness and myalgias. Negative for joint pain, joint pain, joint swelling and morning stiffness.  Skin:  Negative for color change, rash and redness.  Allergic/Immunologic: Negative for susceptible to infections.  Neurological:  Positive for headaches. Negative for dizziness, numbness, memory loss and weakness.  Hematological:  Negative for bruising/bleeding tendency.  Psychiatric/Behavioral:  Positive for sleep disturbance. Negative for confusion.    PMFS History:  Patient Active Problem List   Diagnosis Date Noted   Acute right-sided weakness 01/22/2021   Peripheral polyneuropathy 01/22/2021   History of migraine headaches 01/22/2021   Difficulty with speech    Gastric bypass status for obesity 09/29/2019   Aftercare 08/12/2018   Pain in left knee 06/03/2018   Dyspnea 12/26/2017   Restrictive lung disease secondary to obesity 12/26/2017  Atypical chest pain 12/06/2017   Sinus tachycardia 12/06/2017   Chest pain 12/06/2017   DJD (degenerative joint disease), cervical 12/24/2016   Primary osteoarthritis of both hips 12/24/2016   Primary osteoarthritis of both knees 12/24/2016   H/O total knee replacement, right 12/24/2016   Spondylosis of lumbar region without myelopathy or radiculopathy 12/24/2016   Lumbosacral spondylosis without  myelopathy 12/24/2016   Acute gout 05/30/2016   Gout 05/30/2016   Myalgia 03/29/2016   Other long term (current) drug therapy 03/29/2016   Idiopathic peripheral neuropathy 03/29/2016   Cannot sleep 03/29/2016   Migraine without aura and responsive to treatment 03/29/2016   Multifocal myoclonus 03/29/2016   Restless leg 03/29/2016   Has a tremor 03/29/2016   History of aspiration pneumonitis 01/25/2016   History of acute bronchitis 01/25/2016   LPRD (laryngopharyngeal reflux disease) 01/25/2016   Imbalance 01/09/2016   Serotonin syndrome 12/22/2015   Essential hypertension 09/10/2015   OA (osteoarthritis) of knee 09/05/2015   Obesity 05/17/2015   OSA (obstructive sleep apnea) 03/16/2013   Iron deficiency 11/06/2012   Thrombocythemia 11/06/2012   Leukocytosis 11/05/2012   Impaired fasting glucose 07/23/2012   Bronchitis 04/03/2012   Kidney stone on left side 03/05/2012   Kidney cysts 03/04/2012   Loose stools 03/04/2012   Urinary frequency 12/04/2011   Restless leg syndrome 12/04/2011   Polypharmacy 12/04/2011   Bladder incontinence 12/04/2011   Fibromyalgia    Mixed hyperlipidemia    Edema    S/P endometrial ablation    Allergic rhinitis 09/18/2011   Depression, major, single episode, in partial remission ([place]) 04/21/2011   PRECORDIAL PAIN 02/17/2010   Anxiety state 01/30/2010   Migraine 01/30/2010   GERD without esophagitis 01/30/2010   Gastroparesis 01/30/2010    Past Medical History:  Diagnosis Date   Anemia    previously followed by Dr. Jamse Arn for anemia and elevated platelets   Anxiety    C. difficile colitis 10/01/2012   treated by [facility]   Chronic fatigue syndrome    DDD (degenerative disc disease), lumbar 08/19/2014   and facet arthroplasty & left lumbar radiculopathy (Dr.Ramos)   Depression    Dyssynergia    dyssynergenic defecation, contributing to fecal incontinence.   Edema    Fibromyalgia    Gastroparesis    followed at [facility]   GERD  (gastroesophageal reflux disease)    History of kidney stones    History of vertebral fracture 06/30/2021   Hyperlipidemia    Kidney stone    Lumbar radiculopathy    Migraine    Neuropathy    Obstructive sleep apnea    Does  wear  CPAP   Paresthesia    Dr. Everette Rank at [facility]   Pelvic floor dysfunction    pelvic floor dyssynergy   Plantar fasciitis 02/2011   R foot   Pneumonia    2012   PONV (postoperative nausea and vomiting)    pt states has gastroparesis has difficulty taking antibiotics and narcotics has severe nausea and vomiting    Restless leg syndrome    S/P endometrial ablation 08/09/2006   Novasure Ablation   S/P epidural steroid injection 09/20/2014   Dr.Ramos   Tremor    Dr. Everette Rank   Urinary frequency    Urinary incontinence     Family History  Problem Relation Age of Onset   Allergies Mother    Hypertension Mother    Heart disease Mother        possible valve problem - leaking valve  Macular degeneration Mother    Heart disease Father        pacemaker, CHF   Hypertension Father    Diabetes Father        borderline   Stroke Father 23   Kidney disease Father    Asthma Sister    Irritable bowel syndrome Sister    Allergies Sister    Heart disease Paternal Grandmother    Heart disease Paternal Grandfather    Cancer Maternal Aunt        leukemia   Cancer Maternal Aunt    Colon cancer Maternal Aunt        late 60's   Heart disease Maternal Grandmother    Heart disease Maternal Grandfather    CAD Neg Hx    Past Surgical History:  Procedure Laterality Date   CHOLECYSTECTOMY  9/05   ENDOMETRIAL ABLATION  08/09/2006   Dr. Valentina Shaggy Ablation   FACET JOINT INJECTION  04/17/2017   Left L4-5 and L5-S1   GASTRIC ROUX-EN-Y N/A 09/29/2019   Procedure: LAPAROSCOPIC ROUX-EN-Y GASTRIC BYPASS WITH UPPER ENDOSCOPY, ERAS Pathway;  Surgeon: Johnathan Hausen, MD;  Location: [facility];  Service: General;  Laterality: N/A;   [place]   R knee, Dr. Eddie Dibbles, torn cartilage   RETINAL LASER PROCEDURE Right 08/28/2018   laser retinopexy   RIGHT/LEFT HEART CATH AND CORONARY ANGIOGRAPHY N/A 01/01/2018   Procedure: RIGHT/LEFT HEART CATH AND CORONARY ANGIOGRAPHY;  Surgeon: Sherren Mocha, MD;  Location: [facility];  Service: Cardiovascular;  Laterality: N/A;   TONSILLECTOMY  1968   TONSILLECTOMY     TOTAL KNEE ARTHROPLASTY Right 09/05/2015   Procedure: RIGHT TOTAL KNEE ARTHROPLASTY;  Surgeon: Gaynelle Arabian, MD;  Location: [facility];  Service: Orthopedics;  Laterality: Right;   TOTAL KNEE ARTHROPLASTY Left 07/14/2018   Procedure: LEFT TOTAL KNEE ARTHROPLASTY;  Surgeon: Gaynelle Arabian, MD;  Location: [facility];  Service: Orthopedics;  Laterality: Left;   ULTRASOUND GUIDANCE FOR VASCULAR ACCESS  01/01/2018   Procedure: Ultrasound Guidance For Vascular Access;  Surgeon: Sherren Mocha, MD;  Location: [place];  Service: Cardiovascular;;   UMBILICAL HERNIA REPAIR N/A 09/29/2019   Procedure: HERNIA REPAIR UMBILICAL ADULT;  Surgeon: Johnathan Hausen, MD;  Location: [facility];  Service: General;  Laterality: N/A;   Social History   Social History Narrative   Married, 1 dog. 1 son in [place] (grandson born 06/2017), 1 stepson in [place], with 2 children   Immunization History  Administered Date(s) Administered   Influenza Inj Mdck Quad Pf 09/13/2019   Influenza Split 09/17/2011, 09/03/2012, 09/04/2013   Influenza,inj,Quad PF,6+ Mos 08/16/2014, 07/27/2015, 07/31/2017, 08/13/2018, 07/28/2020   Influenza-Unspecified 10/04/2016, 07/31/2017, 09/23/2017   PFIZER Comirnaty(Gray Top)Covid-19 Tri-Sucrose Vaccine 06/01/2021   PFIZER(Purple Top)SARS-COV-2 Vaccination 02/20/2020, 03/12/2020, 09/20/2020   Tdap 01/16/2011, 06/20/2017   Zoster Recombinat (Shingrix) 02/05/2019, 07/14/2019     Objective: Vital Signs: BP 121/85 (BP Location: Left Arm, Patient Position: Sitting, Cuff Size: Normal)   Pulse 69   Ht 5' 2.5" (1.588 m)    Wt 173 lb 9.6 oz (78.7 kg)   LMP 07/27/2006   BMI 31.25 kg/m    Physical Exam Vitals and nursing note reviewed.  Constitutional:      Appearance: She is well-developed.  HENT:     Head: Normocephalic and atraumatic.  Eyes:     Conjunctiva/sclera: Conjunctivae normal.  Pulmonary:     Effort: Pulmonary effort is normal.  Abdominal:     Palpations:  Abdomen is soft.  Musculoskeletal:     Cervical back: Normal range of motion.  Skin:    General: Skin is warm and dry.     Capillary Refill: Capillary refill takes less than 2 seconds.  Neurological:     Mental Status: She is alert and oriented to person, place, and time.  Psychiatric:        Behavior: Behavior normal.     Musculoskeletal Exam: C-spine has limited ROM with lateral rotation.  Currently wearing back brace.  Shoulder joints, elbow joints, wrist joints, MCPs, PIPs, and DIPs have good ROM with no synovitis.  Complete fist formation bilaterally.  PIP and DIP thickening consistent with OA of both hands.  [place] prominence bilaterally.  Hip joints difficult to assess in seated position.  Bilateral knee replacements have good ROM with no discomfort.  Ankle joints have good ROM with no tenderness or joint swelling.    CDAI Exam: CDAI Score: -- Patient Global: --; Provider Global: -- Swollen: --; Tender: -- Joint Exam 07/05/2021   No joint exam has been documented for this visit   There is currently no information documented on the homunculus. Go to the Rheumatology activity and complete the homunculus joint exam.  Investigation: No additional findings.  Imaging: DG Thoracic Spine 2 View  Result Date: 07/04/2021 CLINICAL DATA:  65 year old female status post fall 2 weeks ago with continued back pain. EXAM: THORACIC SPINE 2 VIEWS COMPARISON:  CTA chest 08/28/2018. No more recent chest or thoracic imaging. FINDINGS: Normal thoracic segmentation. Mild to moderate T8 compression fracture. More sclerotic appearing mild to moderate  T12 (superior endplate) and L1 (inferior endplate) compression fractures. Similar loss of height at each level. No high-grade retropulsion. Other thoracic levels appear intact. Cervicothoracic junction alignment is within normal limits. Grossly intact posterior ribs. Negative visible chest and abdominal visceral contours. Stable cholecystectomy clips. IMPRESSION: 1. Mild to moderate compression fractures of T8, T12, and L1. All are new since 08/28/2018, but the T8 level might be most recent. 2. No other acute osseous abnormality identified in the thoracic spine. Electronically Signed   By: Genevie Ann M.D.   On: 07/04/2021 05:01    Recent Labs: Lab Results  Component Value Date   WBC 8.3 06/28/2021   HGB 13.0 06/28/2021   PLT 370 06/28/2021   NA 133 (L) 06/28/2021   K 5.0 06/28/2021   CL 94 (L) 06/28/2021   CO2 26 06/28/2021   GLUCOSE 93 06/28/2021   BUN 16 06/28/2021   CREATININE 0.82 06/28/2021   BILITOT 0.2 06/28/2021   ALKPHOS 138 (H) 06/28/2021   AST 30 06/28/2021   ALT 30 06/28/2021   PROT 6.3 06/28/2021   ALBUMIN 3.9 06/28/2021   CALCIUM 9.1 06/28/2021   GFRAA 81 11/07/2020    Speciality Comments: No specialty comments available.  Procedures:  No procedures performed Allergies: Erythromycin, Tramadol, and Dilaudid [hydromorphone hcl]        Assessment / Plan:     Visit Diagnoses: Idiopathic chronic gout of multiple sites without tophus - She has not had any signs or symptoms of a gout flare recently.  She is clinically doing well taking allopurinol 300 mg 1 tablet by mouth daily as prescribed.  She has not needed to take colchicine since her last office visit.  She had updated lab work on 06/28/2021: Uric acid was 3.5 which is within the desirable range.  She will continue taking allopurinol 300 mg 1 tablet by mouth daily as prescribed.  She was advised  to notify us if she develops signs or symptoms of a gout flare.  Medication monitoring encounter: Lab work from 06/28/2021 was  reviewed today in the office: CBC within normal limits, CMP stable, and uric acid 3.5.  Fibromyalgia: She has generalized hyperalgesia and positive tender points on examination today.  She has been having more frequent fibromyalgia flares since her fall a few weeks ago.  She is having trapezius muscle tension and tenderness bilaterally.  She is not a good candidate for trigger point injections at this time due to current vertebral fractures.  We discussed that once she has been cleared by her orthopedist we can place a referral to physical therapy for water therapy and dry needling.  In the meantime she can take methocarbamol 500 mg 1 tablet by mouth daily as needed for muscle spasms.  She was given a prescription for Lidoderm patches yesterday while in the ED as well as a prescription for oxycodone for pain relief.  She will remain on Cymbalta as prescribed.  We discussed the importance of good sleep hygiene.  Other fatigue - She has been experiencing increased fatigue secondary to insomnia. She takes armodafinil 250 mg daily with breakfast.   Trapezius muscle spasm: She presents today with trapezius muscle tension and tenderness bilaterally.  She has been experiencing muscle spasms on occasion.  She takes methocarbamol 500 mg 1 tablet by mouth daily as needed for muscle spasms.  She is not a good candidate for trigger point injections due to recent vertebral fractures.  She is interested in trying dry needling in the future.  Age-related osteoporosis with current pathological fracture, initial encounter - DEXA 07/12/2020 reviewed today in the office: Right femoral neck: BMD 0.632 with T score -2.0.  History of multiple vertebral fractures and bariatric surgery in 2020.  Discussed that she meets criteria for osteoporosis since she has had multiple vertebral fractures. She was evaluated yesterday in the ED (07/04/2021) for right-sided pain.  Thoracic x-rays were obtained which revealed several chronic  compression fractures and a new T8 compression fracture.  According to the patient about 2.5 weeks ago she lost her balance and caught her self against the wall, and several day later while at an exercise class she started to have severe thoracic pain.  She was evaluated by Dr. Maxie Better at [facility] and is currently wearing a back brace.  She was prescribed oxycodone and lidoderm patches yesterday to manage her pain.  According to the patient her orthopedist plans to proceed with a MRI.   Discussed previous DEXA results and explained that she will require treatment for management of osteoporosis.  Different treatment options were discussed today in detail.  Indications, contraindications, potential side effects of Forteo were discussed today in detail.  The black box warning associated with increased risk for cancer was discussed. She will stay on forteo for 2 years if tolerated. She will require baseline lab work prior to initiating therapy. CMP updated on 06/28/21.  We will apply for Forteo through her insurance and once approved she will return to the office for the administration of the first injection.  She is slightly apprehensive to perform a daily self injection but she thinks she was able to do it if somebody walks her through the steps upon initiation of therapy.   We also discussed the importance of taking a calcium and vitamin D supplement especially with her history of bariatric surgery.  She has been taking calcium 500 mg 3 times daily.  Her vitamin D  level will be checked today and once the value has resulted we will make further recommendations on the dose of vitamin D supplementation.  All questions were addressed. She will be due to update DEXA in August 2023.  - Plan: VITAMIN D 25 Hydroxy (Vit-D Deficiency, Fractures), Parathyroid hormone, intact (no Ca), Phosphorus, Serum protein electrophoresis with reflex  History of vertebral compression fracture - She has a history of several vertebral  fractures.  She had a fall in March 2021 and fractured several vertebrae at that time.  She was evaluated in the ED yesterday for severe right side pain and had updated thoracic spine x-rays which revealed mild to moderate compression fractures of T8, T12, and L1.  All of these fractures were new since 08/28/2018 but  T8 level was most recent.  According to the patient her orthopedist plans to order an MRI.  DEXA results were consistent with osteopenia in August 2021.  Discussed that due to the multiple compression fractures she meets criteria for osteoporosis requiring treatment.  The plan is to proceed with Forteo pending lab results and insurance approval.  Plan: VITAMIN D 25 Hydroxy (Vit-D Deficiency, Fractures), Parathyroid hormone, intact (no Ca), Phosphorus, Serum protein electrophoresis with reflex  Vitamin D deficiency - Vitamin D level will be checked today.  We will make further recommendations about vitamin D supplementation once the value as resulted. Plan: VITAMIN D 25 Hydroxy (Vit-D Deficiency, Fractures)  Primary osteoarthritis of both hands - She has PIP and DIP thickening consistent with osteoarthritis of both hands.  CMC joint prominence noted bilaterally.  No tenderness or inflammation was noted.  She was able to make a complete fist bilaterally.  Primary osteoarthritis of both hips: Difficult to assess while in seated position. She is not experiencing any discomfort in her hips at this time.   History of total knee replacement, bilateral - Performed by Dr. Wynelle Link. Doing well.  She has good ROM with no discomfort.  No warmth or effusion was noted currently.  DDD (degenerative disc disease), cervical: Slightly limited range of motion with lateral rotation.  She is experiencing trapezius muscle tension and tenderness bilaterally.  She has been experiencing muscle spasms intermittently.  She takes methocarbamol 500 mg 1 tablet by mouth daily as needed for muscle spasms.  We discussed  trying heat, massage, dry needling, and topical agents for pain relief.  She is not a good candidate for trigger point injections due to the need to avoid cortisone at this time.  Once her vertebral fractures have healed we discussed a referral to physical therapy for hydrotherapy and dry needling.  She was advised to notify us when she has been cleared by her orthopedist to start physical therapy and we will place the order at that time.  DDD (degenerative disc disease), lumbar - She has been experiencing significant thoracic and lower back pain.  History of vertebral fractures.  She has been following up with Dr. Maxie Better at [facility].  She is currently wearing a back brace.  She was evaluated in the ED yesterday and was given a prescription for oxycodone and Lidoderm patches for pain relief.  Other medical conditions are listed as follows:  History of depression: She remains on Cymbalta as prescribed.  History of sleep apnea  History of migraine    Orders: Orders Placed This Encounter  Procedures   VITAMIN D 25 Hydroxy (Vit-D Deficiency, Fractures)   Parathyroid hormone, intact (no Ca)   Phosphorus   Serum protein electrophoresis with reflex  No orders of the defined types were placed in this encounter.   Follow-Up Instructions: Return in about 3 months (around 10/05/2021) for Osteoporosis, Gout, Fibromyalgia.   Ofilia Neas, PA-C  Note - This record has been created using Dragon software.  Chart creation errors have been sought, but may not always  have been located. Such creation errors do not reflect on  the standard of medical care.

## 2021-06-23 ENCOUNTER — Ambulatory Visit (INDEPENDENT_AMBULATORY_CARE_PROVIDER_SITE_OTHER): Payer: Medicare Other | Admitting: Family Medicine

## 2021-06-23 ENCOUNTER — Other Ambulatory Visit: Payer: Self-pay

## 2021-06-23 VITALS — BP 112/68 | HR 54 | Temp 97.7°F | Wt 179.8 lb

## 2021-06-23 DIAGNOSIS — S29012A Strain of muscle and tendon of back wall of thorax, initial encounter: Secondary | ICD-10-CM | POA: Diagnosis not present

## 2021-06-23 NOTE — Patient Instructions (Signed)
2 Tylenol 4 times a day for the next week.  Proper posturing.  Heat to your back for 20 minutes 3 times per day gentle stretching  after that.  In bed put a pillow under your knees and see if that helps flatten your back out or if you lie on your side did put a pillow between your knees

## 2021-06-23 NOTE — Progress Notes (Signed)
   Subjective:    Patient ID: [name], female    DOB: 03/19/1956, 65 y.o.   MRN: [id number]  HPI She complains of a 1 week history of midthoracic pain.  She did state that 1 day prior to this she did fall but is unsure exactly the mechanism of the fall but did feel as if she twisted her back.  No numbness, tingling or weakness.  She has had previous bariatric surgery and has to be careful concerning pain medications.   Review of Systems     Objective:   Physical Exam Slight tenderness palpation over the paravertebral muscles of the mid thoracic area.  Lungs are clear to auscultation.  No pain on motion of the back.  Cardiac exam normal.       Assessment & Plan:  Upper back strain, initial encounter   Tylenol 4 times a day for the next week.  Proper posturing.  Heat to your back for 20 minutes 3 times per day gentle stretching  after that.  In bed put a pillow under your knees and see if that helps flatten your back out or if you lie on your side did put a pillow between your knees I explained that short-term use of Tylenol should not be an issue with her liver enzymes.  She can resume normal activities based on her pain.

## 2021-06-26 ENCOUNTER — Encounter: Payer: Medicare Other | Attending: Surgery | Admitting: Skilled Nursing Facility1

## 2021-06-26 DIAGNOSIS — S22060A Wedge compression fracture of T7-T8 vertebra, initial encounter for closed fracture: Secondary | ICD-10-CM

## 2021-06-26 HISTORY — DX: Wedge compression fracture of T7-T8 vertebra, initial encounter for closed fracture: S22.060A

## 2021-06-27 NOTE — Progress Notes (Signed)
Chief Complaint  Patient presents with   Medicare Wellness    Fasting AWV/CPE no pap sees GYN. Having upper right sided abdominal pain that radiates around to her back Heat helps her back some, but nothing else seems to help. Some nausea.     [name] is a 65 y.o. female who presents for annual physical exam, Medicare wellness visit and follow-up on chronic medical conditions.   She has the following concerns:  RUQ pain that radiates around to back. It has been coming and going over the last couple of weeks, didn't have the abdominal pain when she saw Dr. Redmond School last week.  Pain got worse yesterday, on the right side of her chest wall and in her back. Pain is located over the lower ribs, under her R breast. She denies any rash.  She is taking tylenol QID per Dr. Redmond School. Unsure if it helps much. In a lot of pain today, hasn't taken any tylenol yet, fasting  Heat helps with back pain, temporarily. The pain in her back was between her shoulder blades, in the center.  Pain isn't as bad today, was worse yesterday.  Today it is more the chest/abdomen that hurts.  She fell in the middle of the night while getting off the commode--hit R shoulder  against a wall, and R knee also hit wall.  This was a few days before onset of back pain.   She was hospitalized overnight from 2/26-2/27/22. She had episode of R sided weakness and slurred speech, followed by headache. She had head CT, MRI of brain and cervical spine, no acute abnormalities noted. Working diagnosis was TIA vs complicated migraine. No recurrent symptoms. She has seen neuro since then (see below).  Pre-diabetes and obesity:  A1c was up to 6% in 12/2020, had been 5.7% at her physical last year. She had Roux-En-Y on 09/29/2019.  She is followed at [facility] by the surgeons and nutritionist.  She goes to the bariatric exercise program 3x/week. She eats more fruits than vegetables.  Only occasional sweets. Limits past, +potatoes (tries to  limit).  Lab Results  Component Value Date   HGBA1C 6.0 (H) 01/22/2021     Hyperlipidemia follow-up:  Patient is trying to follow a low-fat, low cholesterol diet as best she can.  Compliant with medications (atorvastatin '40mg'$ ) and denies medication side effects. Lab Results  Component Value Date   CHOL 118 01/22/2021   HDL 45 01/22/2021   LDLCALC 54 01/22/2021   TRIG 97 01/22/2021   CHOLHDL 2.6 01/22/2021   Hypertension: (never formally diagnosed, because had been on diuretics for fluid retention (stopped a while ago) and beta blockers for tachycardia and tremor--but high BP's noted when not taking these medications in past).   She continues on metoprolol '50mg'$  BID.  Denies chest pain, palpitations/tachycardia, lightheadedness, edema. BP Readings from Last 3 Encounters:  06/23/21 112/68  05/23/21 119/68  04/26/21 110/70   She is under the care of Dr. Estanislado Pandy for fibromyalgia  osteoarthritis, degenerative disc disease, chronic fatigue.    She continues on cymbalta, gabapentin (also for neuropathy in feet), and robaxin '500mg'$  BID prn. She also prescribes her armodafinil for her chronic fatigue.  She gets massages 1-2x/month.  She has pain daily but reports it is manageable most of the time. Bothers her the most to stand still (washing dishes)   Gout--she continues on allopurinol '300mg'$  daily, and hasn't had any flares in several years. She has colchicine to use prn flares. Lab Results  Component  Value Date   LABURIC 4.4 07/04/2020     She has OSA, under the care of Dr. Radford Pax. She is on CPAP. She last saw her in 04/2021. Energy is improved, but still requires Nuvigil. This week she has been sleeping in her recliner due to the back pain, hasn't used CPAP.   She is under the care of Dr. Valaria Good for RLS, neuropathy (pedal paresthesias), tremor (benign essential vs accentuated physiologic tremor), migraines. She takes gabapentin for restless legs as well as her neuropathy pain. On beta  blocker for tremor. On pamelor for migraine prevention. He is in the process of titrating down nortiptyline, as she is now on oxcarbazepine, and neuropathy has improved since adding this.  At last visit with Dr. Everette Rank in 04/2021, he increased oxcarbazepine dose to '150mg'$ /'300mg'$ , was on '100mg'$  of nortriptyline at that time, but hopes to be able to titrate down at the 3 month follow-up.  Neuropathy has improved some since dose change.  She has had a couple of balance issues in the night, worries these could be related to medication side effects. He has also noted episodic asterixis and transient R hand paresthesias, possible median neuropathy.   RLS is well controlled most of the time. Her headaches are much better--very rare migraines, no longer getting frequent headaches (temporal).   Depression:  Sees Regina at [place], and is doing well on Cymbalta and Abilify.   GERD and h/o gastroparesis: Protonix was cut back to once daily after her Roux-en-Y. Denies any nausea and vomiting, gastroparesis is much better. Only rarely needs zofran, as she still has some nausea.  She last saw GI in 04/2021.   She had T12 and L1 compression fractures after a fall in 01/2020. Foot got caught on a table leg when getting up from a table at a restaurant (thinks she twisted some as she fell).  This was not felt to be fragility fracture, but from trauma, and GYN Dr. Dellis Filbert didn't feel bone-building medication was indicated (07/2020). DEXA in 2018 was normal.  DEXA 06/2020 T-2.0 R fem neck, osteopenia   Immunization History  Administered Date(s) Administered   Influenza Inj Mdck Quad Pf 09/13/2019   Influenza Split 09/17/2011, 09/03/2012, 09/04/2013   Influenza,inj,Quad PF,6+ Mos 08/16/2014, 07/27/2015, 07/31/2017, 08/13/2018, 07/28/2020   Influenza-Unspecified 10/04/2016, 07/31/2017, 09/23/2017   PFIZER Comirnaty(Gray Top)Covid-19 Tri-Sucrose Vaccine 06/01/2021   PFIZER(Purple Top)SARS-COV-2 Vaccination 02/20/2020,  03/12/2020, 09/20/2020   Tdap 01/16/2011, 06/20/2017   Zoster Recombinat (Shingrix) 02/05/2019, 07/14/2019   Last Pap smear: 08/2018, Dr. Cyndie Chime Last mammogram: 10/2020 Last colonoscopy: 11/2012 at [place]: 06/2020 T-2.0 R fem neck (prior was 06/2017, normal) Dentist: twice yearly Ophtho: yearly Exercise:  Bariatric exercise class 3x/week (Mon--strength, Wed--cardio, Fri-balance and cardio), also does stations and weights 30 min before class. Outside of classes, she walks 30-60 mins (60 mins on days she doesn't go to the gym).  Vitamin D-OH 58.6 in 04/2020 Thyroid: Lab Results  Component Value Date   TSH 2.406 01/22/2021    Other doctors caring for patient include: GYN: Dr. Delilah Shan (last seen 09/2020) Neuro: Dr. Everette Rank  Sleep med/OSA: Dr. Fransico Him (has previously seen Dr. Brett Fairy for OSA in past) Rheum: Dr. Estanislado Pandy Psych: Rollene Fare (previously Dr. Caprice Beaver) GI: Dr. Derrill Kay, Dr. Roney Mans Podiatry: Dr. Lora Havens: at [facility] Dentist: Dr. Deniece Ree Urologist: Dr. Jeffie Pollock Pulmonologist: Dr. Deland Pretty Cardiologist: Dr. Johnsie Cancel  Orthopedist: [place], [place] Derm: Dr. Ubaldo Glassing Bariatric surgeon: Dr. Hassell Done  Depression screen: negative Fall screen: 1-2 in the last year, no significant  injuries. Recently fell getting up from the toilet in the middle of the night Functional Status Survey some leakage of urine. Some concentration problems (long-term, unchanged) Mini-Cog screen: normal See full screens in Epic  She has advanced directives (living will, healthcare power of attorney). Scanned in chart in 05/2019.  PMH, PSH, SH and FH were updated and reviewed.  Outpatient Encounter Medications as of 06/28/2021  Medication Sig Note   acetaminophen (TYLENOL) 500 MG tablet Take 1,000 mg by mouth every 8 (eight) hours as needed for moderate pain.    allopurinol (ZYLOPRIM) 300 MG tablet TAKE 1 TABLET BY MOUTH EVERY DAY    ALPRAZolam (XANAX) 0.25 MG tablet Take 1 tablet (0.25 mg  total) by mouth 3 (three) times daily as needed for anxiety. 06/28/2021: Takes up to twice daily   ARIPiprazole (ABILIFY) 5 MG tablet Take 1 tablet (5 mg total) by mouth daily.    Armodafinil 250 MG tablet TAKE 1 TABLET BY MOUTH EVERY DAY WITH BREAKFAST    atorvastatin (LIPITOR) 40 MG tablet TAKE 1 TABLET BY MOUTH EVERY DAY    CALCIUM PO Take 500 mg by mouth in the morning, at noon, and at bedtime.    cetirizine (ZYRTEC) 10 MG tablet Take 10 mg by mouth at bedtime.    diphenhydrAMINE (BENADRYL) 25 MG tablet Take 25 mg by mouth every 6 (six) hours as needed. 06/28/2021: Uses prn for itching   DULoxetine (CYMBALTA) 60 MG capsule Take 1 capsule (60 mg total) by mouth 2 (two) times daily.    fluticasone (FLONASE) 50 MCG/ACT nasal spray PLACE 2 SPRAYS INTO BOTH NOSTRILS DAILY AS NEEDED FOR ALLERGIES    gabapentin (NEURONTIN) 300 MG capsule Take 300 mg by mouth 3 (three) times daily.    Melatonin 10 MG TABS Take 5 mg by mouth at bedtime.     methocarbamol (ROBAXIN) 500 MG tablet TAKE 1 TABLET BY MOUTH EVERY DAY AS NEEDED 06/28/2021: Taking BID for the last week   metoprolol tartrate (LOPRESSOR) 50 MG tablet TAKE 1 TABLET BY MOUTH TWICE A DAY    Multiple Vitamins-Minerals (BARIATRIC MULTIVITAMINS/IRON PO) Take 2 each by mouth daily.    Multiple Vitamins-Minerals (ICAPS AREDS 2) CAPS Take 1 capsule by mouth 2 (two) times daily.    nortriptyline (PAMELOR) 50 MG capsule Take 100 mg by mouth at bedtime.    OXcarbazepine (TRILEPTAL) 150 MG tablet Take 150 mg by mouth 2 (two) times daily. 06/28/2021: Taking 1 in morning, 2 in evening   pantoprazole (PROTONIX) 40 MG tablet Take 1 tablet (40 mg total) by mouth daily.    Probiotic Product (Howell) CAPS Take 1 capsule by mouth at bedtime.     Propylene Glycol 0.6 % SOLN Place 1 drop into both eyes daily at 12 noon.    RESTASIS 0.05 % ophthalmic emulsion 1 drop 2 (two) times daily.    colchicine 0.6 MG tablet TAKE 1 TABLET BY MOUTH DAILY AS NEEDED DURING  GOUT FLARES. (Patient not taking: No sig reported)    HYDROcodone-acetaminophen (NORCO/VICODIN) 5-325 MG tablet Take 1 tablet by mouth every 4 (four) hours as needed for moderate pain. (Patient not taking: No sig reported)    meclizine (ANTIVERT) 25 MG tablet Take 1 tablet (25 mg total) by mouth 3 (three) times daily as needed for dizziness. (Patient not taking: Reported on 06/28/2021)    ondansetron (ZOFRAN) 8 MG tablet Take 8 mg by mouth every 8 (eight) hours as needed for nausea. (Patient not taking: Reported on 06/28/2021)  05/02/2020: Uses prn, very rarely   prochlorperazine (COMPAZINE) 10 MG tablet Take 10 mg by mouth as needed. (Patient not taking: Reported on 06/28/2021)    promethazine (PHENERGAN) 25 MG tablet Take 25 mg by mouth as needed. (Patient not taking: Reported on 06/28/2021)    [DISCONTINUED] amoxicillin (AMOXIL) 875 MG tablet Take 1 tablet (875 mg total) by mouth 2 (two) times daily. (Patient not taking: Reported on 06/23/2021)    No facility-administered encounter medications on file as of 06/28/2021.   Had to take benadryl yetserday, for some itching Takes zyrtec daily,   Allergies  Allergen Reactions   Erythromycin Nausea Only    Abdominal pain   Tramadol Itching   Dilaudid [Hydromorphone Hcl] Itching     ROS: The patient denies fever, vision changes, decreased hearing, breast concerns, palpitations, dizziness, syncope, cough, swelling, bowel changes (goes back and forth with diarrhea and constipation, per her usual; diarrhea isn't as often as it used to be), no abdominal pain, melena, hematochezia, indigestion/heartburn, hematuria, dysuria, vaginal bleeding, discharge, odor or itch, genital lesions, weakness, suspicious skin lesions, abnormal bleeding/bruising, or enlarged lymph nodes. No longer having shortness of breath. Fibromyalgia unchanged, manageable. Moods are well controlled.  No gout flares. Chronic urinary leakage, stable. Upper back pain per HPI R lower chest/upper  abdomen pain per HPI Neuropathy is improving with med changes per neuro Occasional headaches, overall improved, no migraines. Some fatigue lately, but overall better than in the past. Moods stable    PHYSICAL EXAM:  BP 120/72   Pulse 68   Ht '5\' 3"'$  (1.6 m)   Wt 181 lb (82.1 kg)   LMP 07/27/2006   BMI 32.06 kg/m   Wt Readings from Last 3 Encounters:  06/28/21 181 lb (82.1 kg)  06/23/21 179 lb 12.8 oz (81.6 kg)  05/23/21 179 lb (81.2 kg)    General Appearance:    Alert, cooperative, no distress, appears stated age  Head:    Normocephalic, without obvious abnormality, atraumatic  Eyes:    PERRL, conjunctiva/corneas clear, EOM's intact, fundi    benign  Ears:    Normal TM's and external ear canals  Nose:   Not examined, wearing mask due to COVID-19 pandemic  Throat:  Not examined, wearing mask due to COVID-19 pandemic  Neck:   Supple, no lymphadenopathy;  thyroid:  no enlargement/ tenderness/nodules; no carotid bruit or JVD  Back:    Spine nontender, no curvature, ROM normal, no CVA tenderness. Tender medially to R scapula, over rhomboid; no spasm palpable.   Lungs:     Clear to auscultation bilaterally without wheezes, rales or  ronchi; respirations unlabored  Chest Wall:    No tenderness or deformity. Very small area of mild tenderness, and inferior-most costochondral junction on the right.    Heart:    Regular rate and rhythm, S1 and S2 normal, no murmur, rub or gallop  Breast Exam:    Deferred to GYN  Abdomen:     Soft, obese, nondistended, normoactive bowel sounds, no masses, no hepatosplenomegaly.  Mildly tender in epigastrium and RUQ, negative Murphy.  Location of discomfort is along lower ribs, below breast, but isn't tender here.  Genitalia:    Deferred to GYN       Extremities:   No clubbing, cyanosis or edema.   Pulses:   2+ and symmetric all extremities  Skin:   Skin color, texture, turgor normal, no rashes or lesions  Lymph nodes:   Cervical, supraclavicular, and  axillary nodes normal  Neurologic:  Normal strength, sensation and gait; reflexes 2+ and symmetric throughout. No significant tremor noted                             Psych:   Normal mood, affect, hygiene and grooming   Lab Results  Component Value Date   HGBA1C 5.7 (A) 06/28/2021   PHQ-9 score of 7    ASSESSMENT/PLAN:  Medicare annual wellness visit, subsequent  Mixed hyperlipidemia  Gastroesophageal reflux disease without esophagitis - controlled/improved  Pre-diabetes - counseled re: diet, exercise, wt loss - Plan: HgB A1c, Comprehensive metabolic panel  Elevated LFTs  History of Roux-en-Y gastric bypass  Fibromyalgia - stable  OSA (obstructive sleep apnea) - cont CPAP. Further wt loss recommended  Restless leg syndrome - controlled  Idiopathic peripheral neuropathy - meds being adjusted by neuro. some excess sedation, may need nortriptyline dose lowered sooner  Migraine without status migrainosus, not intractable, unspecified migraine type  Acute gout of right foot, unspecified cause - cont allopurinol - Plan: Uric acid  Right upper quadrant abdominal pain - unclear etiology--pain is partly lower chest/upper abdomen. thoracic radiculopathy in differential. trial heat, topicals, tylenol - Plan: POCT Urinalysis DIP (Proadvantage Device)  Medication monitoring encounter - Plan: Comprehensive metabolic panel, CBC with Differential/Platelet, Uric acid  Upper back strain, subsequent encounter - heat, stretches. Refer to PT. Can't take NSAIDs - Plan: Ambulatory referral to Physical Therapy  History of recent fall - thoracic pain shortly after. no spinal tenderness. Consider XR if persistent pain - Plan: Ambulatory referral to Physical Therapy  Depression, major, single episode, in partial remission ([employer]) - under care of psych  Discussed monthly self breast exams and yearly mammograms; at least 30 minutes of aerobic activity at least 5 days/week, weight-bearing exercise  at least 2x/wk; proper sunscreen use reviewed; healthy diet, including goals of calcium and vitamin D intake and alcohol recommendations (less than or equal to 1 drink/day) reviewed; regular seatbelt use; changing batteries in smoke detectors, carbon monoxide detectors. Immunization recommendations discussed, UTD. Continue yearly high dose flu shots in the Fall. Colonoscopy is UTD per her GI.  Full Code, Full Care MOST form reviewed/updated   Medicare Attestation I have personally reviewed: The patient's medical and social history Their use of alcohol, tobacco or illicit drugs Their current medications and supplements The patient's functional ability including ADLs,fall risks, home safety risks, cognitive, and hearing and visual impairment Diet and physical activities Evidence for depression or mood disorders  The patient's weight, height, BMI have been recorded in the chart.  I have made referrals, counseling, and provided education to the patient based on review of the above and I have provided the patient with a written personalized care plan for preventive services.

## 2021-06-27 NOTE — Patient Instructions (Addendum)
  HEALTH MAINTENANCE RECOMMENDATIONS:  It is recommended that you get at least 30 minutes of aerobic exercise at least 5 days/week (for weight loss, you may need as much as 60-90 minutes). This can be any activity that gets your heart rate up. This can be divided in 10-15 minute intervals if needed, but try and build up your endurance at least once a week.  Weight bearing exercise is also recommended twice weekly.  Eat a healthy diet with lots of vegetables, fruits and fiber.  "Colorful" foods have a lot of vitamins (ie green vegetables, tomatoes, red peppers, etc).  Limit sweet tea, regular sodas and alcoholic beverages, all of which has a lot of calories and sugar.  Up to 1 alcoholic drink daily may be beneficial for women (unless trying to lose weight, watch sugars).  Drink a lot of water.  Calcium recommendations are 1200-1500 mg daily (1500 mg for postmenopausal women or women without ovaries), and vitamin D 1000 IU daily.  This should be obtained from diet and/or supplements (vitamins), and calcium should not be taken all at once, but in divided doses.  Monthly self breast exams and yearly mammograms for women over the age of 64 is recommended.  Sunscreen of at least SPF 30 should be used on all sun-exposed parts of the skin when outside between the hours of 10 am and 4 pm (not just when at beach or pool, but even with exercise, golf, tennis, and yard work!)  Use a sunscreen that says "broad spectrum" so it covers both UVA and UVB rays, and make sure to reapply every 1-2 hours.  Remember to change the batteries in your smoke detectors when changing your clock times in the spring and fall. Carbon monoxide detectors are recommended for your home.  Use your seat belt every time you are in a car, and please drive safely and not be distracted with cell phones and texting while driving.   [name] , Thank you for taking time to come for your Medicare Wellness Visit. I appreciate your ongoing  commitment to your health goals. Please review the following plan we discussed and let me know if I can assist you in the future.    This is a list of the screening recommended for you and due dates:  Health Maintenance  Topic Date Due   Pneumococcal Vaccination (1 - PCV) Never done   Flu Shot  06/26/2021   Pap Smear  09/09/2021   COVID-19 Vaccine (5 - Booster for Pfizer series) 10/02/2021   Mammogram  11/11/2021   Colon Cancer Screening  12/04/2022   Tetanus Vaccine  06/21/2027   Hepatitis C Screening: USPSTF Recommendation to screen - Ages 18-79 yo.  Completed   HIV Screening  Completed   Zoster (Shingles) Vaccine  Completed   HPV Vaccine  65   Urine Protein Check  Discontinued   Continue yearly flu shots (in the Fall) Continue yearly mammograms. Next bone density test will be due 06/2022 You will get your pneumonia shot when you are 65 (prevnar-20)  If you're feeling too groggy at night (and falling), you should discuss this with Dr. Barnett Hatter may want to cut back on the nortriptyline now, rather than waiting until your next visit.  We are referring you to physical therapy for your pain. You can try topical medications (such as SalonPas, or Biofreeze) You can continue the heat. Your labs will let us know if safe to continue with tylenol for longer.

## 2021-06-28 ENCOUNTER — Encounter: Payer: Self-pay | Admitting: Family Medicine

## 2021-06-28 ENCOUNTER — Other Ambulatory Visit: Payer: Self-pay

## 2021-06-28 ENCOUNTER — Ambulatory Visit (INDEPENDENT_AMBULATORY_CARE_PROVIDER_SITE_OTHER): Payer: Medicare Other | Admitting: Family Medicine

## 2021-06-28 VITALS — BP 120/72 | HR 68 | Ht 63.0 in | Wt 181.0 lb

## 2021-06-28 DIAGNOSIS — R1011 Right upper quadrant pain: Secondary | ICD-10-CM

## 2021-06-28 DIAGNOSIS — S29012D Strain of muscle and tendon of back wall of thorax, subsequent encounter: Secondary | ICD-10-CM

## 2021-06-28 DIAGNOSIS — Z5181 Encounter for therapeutic drug level monitoring: Secondary | ICD-10-CM

## 2021-06-28 DIAGNOSIS — Z0000 Encounter for general adult medical examination without abnormal findings: Secondary | ICD-10-CM

## 2021-06-28 DIAGNOSIS — K219 Gastro-esophageal reflux disease without esophagitis: Secondary | ICD-10-CM

## 2021-06-28 DIAGNOSIS — M109 Gout, unspecified: Secondary | ICD-10-CM | POA: Diagnosis not present

## 2021-06-28 DIAGNOSIS — E782 Mixed hyperlipidemia: Secondary | ICD-10-CM

## 2021-06-28 DIAGNOSIS — G43909 Migraine, unspecified, not intractable, without status migrainosus: Secondary | ICD-10-CM

## 2021-06-28 DIAGNOSIS — R7989 Other specified abnormal findings of blood chemistry: Secondary | ICD-10-CM

## 2021-06-28 DIAGNOSIS — M797 Fibromyalgia: Secondary | ICD-10-CM | POA: Diagnosis not present

## 2021-06-28 DIAGNOSIS — R7303 Prediabetes: Secondary | ICD-10-CM | POA: Diagnosis not present

## 2021-06-28 DIAGNOSIS — Z9884 Bariatric surgery status: Secondary | ICD-10-CM | POA: Diagnosis not present

## 2021-06-28 DIAGNOSIS — F324 Major depressive disorder, single episode, in partial remission: Secondary | ICD-10-CM

## 2021-06-28 DIAGNOSIS — G609 Hereditary and idiopathic neuropathy, unspecified: Secondary | ICD-10-CM

## 2021-06-28 DIAGNOSIS — G2581 Restless legs syndrome: Secondary | ICD-10-CM | POA: Diagnosis not present

## 2021-06-28 DIAGNOSIS — G4733 Obstructive sleep apnea (adult) (pediatric): Secondary | ICD-10-CM

## 2021-06-28 DIAGNOSIS — Z9181 History of falling: Secondary | ICD-10-CM

## 2021-06-28 LAB — POCT URINALYSIS DIP (PROADVANTAGE DEVICE)
Bilirubin, UA: NEGATIVE
Blood, UA: NEGATIVE
Glucose, UA: NEGATIVE mg/dL
Ketones, POC UA: NEGATIVE mg/dL
Leukocytes, UA: NEGATIVE
Nitrite, UA: NEGATIVE
Protein Ur, POC: NEGATIVE mg/dL
Specific Gravity, Urine: 1.01
Urobilinogen, Ur: NEGATIVE
pH, UA: 8 (ref 5.0–8.0)

## 2021-06-28 LAB — POCT GLYCOSYLATED HEMOGLOBIN (HGB A1C): Hemoglobin A1C: 5.7 % — AB (ref 4.0–5.6)

## 2021-06-29 LAB — COMPREHENSIVE METABOLIC PANEL
ALT: 30 IU/L (ref 0–32)
AST: 30 IU/L (ref 0–40)
Albumin/Globulin Ratio: 1.6 (ref 1.2–2.2)
Albumin: 3.9 g/dL (ref 3.8–4.8)
Alkaline Phosphatase: 138 IU/L — ABNORMAL HIGH (ref 44–121)
BUN/Creatinine Ratio: 20 (ref 12–28)
BUN: 16 mg/dL (ref 8–27)
Bilirubin Total: 0.2 mg/dL (ref 0.0–1.2)
CO2: 26 mmol/L (ref 20–29)
Calcium: 9.1 mg/dL (ref 8.7–10.3)
Chloride: 94 mmol/L — ABNORMAL LOW (ref 96–106)
Creatinine, Ser: 0.82 mg/dL (ref 0.57–1.00)
Globulin, Total: 2.4 g/dL (ref 1.5–4.5)
Glucose: 93 mg/dL (ref 65–99)
Potassium: 5 mmol/L (ref 3.5–5.2)
Sodium: 133 mmol/L — ABNORMAL LOW (ref 134–144)
Total Protein: 6.3 g/dL (ref 6.0–8.5)
eGFR: 80 mL/min/{1.73_m2} (ref >59)

## 2021-06-29 LAB — CBC WITH DIFFERENTIAL/PLATELET
Basophils Absolute: 0.1 10*3/uL (ref 0.0–0.2)
Basos: 1 %
EOS (ABSOLUTE): 0.3 10*3/uL (ref 0.0–0.4)
Eos: 3 %
Hematocrit: 39.7 % (ref 34.0–46.6)
Hemoglobin: 13 g/dL (ref 11.1–15.9)
Immature Grans (Abs): 0 10*3/uL (ref 0.0–0.1)
Immature Granulocytes: 0 %
Lymphocytes Absolute: 2.2 10*3/uL (ref 0.7–3.1)
Lymphs: 26 %
MCH: 29.9 pg (ref 26.6–33.0)
MCHC: 32.7 g/dL (ref 31.5–35.7)
MCV: 91 fL (ref 79–97)
Monocytes Absolute: 0.7 10*3/uL (ref 0.1–0.9)
Monocytes: 8 %
Neutrophils Absolute: 5.1 10*3/uL (ref 1.4–7.0)
Neutrophils: 62 %
Platelets: 370 10*3/uL (ref 150–450)
RBC: 4.35 x10E6/uL (ref 3.77–5.28)
RDW: 12.7 % (ref 11.7–15.4)
WBC: 8.3 10*3/uL (ref 3.4–10.8)

## 2021-06-29 LAB — URIC ACID: Uric Acid: 3.5 mg/dL (ref 3.0–7.2)

## 2021-06-29 NOTE — Progress Notes (Signed)
Thank you :)

## 2021-06-30 DIAGNOSIS — S2020XA Contusion of thorax, unspecified, initial encounter: Secondary | ICD-10-CM | POA: Diagnosis not present

## 2021-06-30 DIAGNOSIS — Z8781 Personal history of (healed) traumatic fracture: Secondary | ICD-10-CM

## 2021-06-30 DIAGNOSIS — M546 Pain in thoracic spine: Secondary | ICD-10-CM | POA: Diagnosis not present

## 2021-06-30 DIAGNOSIS — R0781 Pleurodynia: Secondary | ICD-10-CM | POA: Diagnosis not present

## 2021-06-30 DIAGNOSIS — S22000A Wedge compression fracture of unspecified thoracic vertebra, initial encounter for closed fracture: Secondary | ICD-10-CM | POA: Diagnosis not present

## 2021-06-30 HISTORY — DX: Personal history of (healed) traumatic fracture: Z87.81

## 2021-07-03 ENCOUNTER — Ambulatory Visit: Payer: Medicare Other | Admitting: Rheumatology

## 2021-07-04 ENCOUNTER — Encounter (HOSPITAL_BASED_OUTPATIENT_CLINIC_OR_DEPARTMENT_OTHER): Payer: Self-pay | Admitting: Emergency Medicine

## 2021-07-04 ENCOUNTER — Emergency Department (HOSPITAL_BASED_OUTPATIENT_CLINIC_OR_DEPARTMENT_OTHER): Payer: Medicare Other | Admitting: Radiology

## 2021-07-04 ENCOUNTER — Emergency Department (HOSPITAL_BASED_OUTPATIENT_CLINIC_OR_DEPARTMENT_OTHER)
Admission: EM | Admit: 2021-07-04 | Discharge: 2021-07-04 | Disposition: A | Discharge status: Home / Self Care | Payer: Medicare Other | Attending: Emergency Medicine | Admitting: Emergency Medicine

## 2021-07-04 ENCOUNTER — Other Ambulatory Visit: Payer: Self-pay

## 2021-07-04 DIAGNOSIS — M546 Pain in thoracic spine: Secondary | ICD-10-CM | POA: Diagnosis not present

## 2021-07-04 DIAGNOSIS — Z96653 Presence of artificial knee joint, bilateral: Secondary | ICD-10-CM | POA: Insufficient documentation

## 2021-07-04 DIAGNOSIS — S299XXA Unspecified injury of thorax, initial encounter: Secondary | ICD-10-CM | POA: Diagnosis present

## 2021-07-04 DIAGNOSIS — I10 Essential (primary) hypertension: Secondary | ICD-10-CM | POA: Diagnosis not present

## 2021-07-04 DIAGNOSIS — S22000A Wedge compression fracture of unspecified thoracic vertebra, initial encounter for closed fracture: Secondary | ICD-10-CM

## 2021-07-04 DIAGNOSIS — S22060A Wedge compression fracture of T7-T8 vertebra, initial encounter for closed fracture: Secondary | ICD-10-CM | POA: Diagnosis not present

## 2021-07-04 DIAGNOSIS — W1839XA Other fall on same level, initial encounter: Secondary | ICD-10-CM | POA: Insufficient documentation

## 2021-07-04 MED ORDER — LIDOCAINE 5 % EX PTCH: 1.0000 | MEDICATED_PATCH | CUTANEOUS | 0 refills | Status: DC

## 2021-07-04 MED ORDER — ACETAMINOPHEN 325 MG PO TABS
650.0000 mg | ORAL_TABLET | Freq: Once | ORAL | Status: AC
  Administered 2021-07-04: 650 mg via ORAL
  Filled 2021-07-04: qty 2

## 2021-07-04 MED ORDER — OXYCODONE HCL 5 MG PO TABS: 2.5000 mg | ORAL_TABLET | Freq: Four times a day (QID) | ORAL | 0 refills | Status: AC | PRN

## 2021-07-04 MED ORDER — LIDOCAINE 5 % EX PTCH
1.0000 | MEDICATED_PATCH | Freq: Once | CUTANEOUS | Status: DC
  Administered 2021-07-04: 1 via TRANSDERMAL
  Filled 2021-07-04: qty 1

## 2021-07-04 NOTE — ED Provider Notes (Signed)
[facility] EMERGENCY DEPT Provider Note  CSN: [id number] Arrival date & time: 07/04/21 0244  Chief Complaint(s) Right side pain  HPI [name] is a 65 y.o. female with past medical history listed below including known compression fractures in the thoracic spine currently being followed by [facility] for a new compression fracture is here for uncontrolled pain related to her compression fractures.  Pain is midthoracic level radiating around to the right.  No tenderness to palpation or exacerbation with palpation.  [name] has been prescribed Vicodin but it has not helped her pain tonight.  [name] is scheduled for TLSO brace later on today.  Denies any change in gait ability.  No other physical complaints.  HPI  Past Medical History Past Medical History:  Diagnosis Date   Anemia    previously followed by Dr. Jamse Arn for anemia and elevated platelets   Anxiety    C. difficile colitis 10/01/12   treated by [facility]   Chronic fatigue syndrome    DDD (degenerative disc disease), lumbar 08/19/14   and facet arthroplasty & left lumbar radiculopathy (Dr.Ramos)   Depression    Dyssynergia    dyssynergenic defecation, contributing to fecal incontinence.   Edema    Fibromyalgia    Gastroparesis    followed at [facility]   GERD (gastroesophageal reflux disease)    History of kidney stones    Hyperlipidemia    Kidney stone    Lumbar radiculopathy    Migraine    Neuropathy    Obstructive sleep apnea    Does  wear  CPAP   Paresthesia    Dr. Everette Rank at [facility]   Pelvic floor dysfunction    pelvic floor dyssynergy   Plantar fasciitis 02/2011   R foot   Pneumonia    2012   PONV (postoperative nausea and vomiting)    pt states has gastroparesis has difficulty taking antibiotics and narcotics has severe nausea and vomiting    Restless leg syndrome    S/P endometrial ablation 08/09/2006   Novasure Ablation   S/P epidural steroid injection 09/20/14   Dr.Ramos   Tremor     Dr. Everette Rank   Urinary frequency    Urinary incontinence    [name] Active Problem List   Diagnosis Date Noted   Acute right-sided weakness 01/22/2021   Peripheral polyneuropathy 01/22/2021   History of migraine headaches 01/22/2021   Difficulty with speech    Gastric bypass status for obesity 09/29/2019   Aftercare 08/12/2018   Pain in left knee 06/03/2018   Dyspnea 12/26/2017   Restrictive lung disease secondary to obesity 12/26/2017   Atypical chest pain 12/06/2017   Sinus tachycardia 12/06/2017   Chest pain 12/06/2017   DJD (degenerative joint disease), cervical 12/24/2016   Primary osteoarthritis of both hips 12/24/2016   Primary osteoarthritis of both knees 12/24/2016   H/O total knee replacement, right 12/24/2016   Spondylosis of lumbar region without myelopathy or radiculopathy 12/24/2016   Lumbosacral spondylosis without myelopathy 12/24/2016   Acute gout 05/30/2016   Gout 05/30/2016   Myalgia 03/29/2016   Other long term (current) drug therapy 03/29/2016   Idiopathic peripheral neuropathy 03/29/2016   Cannot sleep 03/29/2016   Migraine without aura and responsive to treatment 03/29/2016   Multifocal myoclonus 03/29/2016   Restless leg 03/29/2016   Has a tremor 03/29/2016   History of aspiration pneumonitis 01/25/2016   History of acute bronchitis 01/25/2016   LPRD (laryngopharyngeal reflux disease) 01/25/2016   Imbalance 01/09/2016   Serotonin syndrome  12/22/2015   Essential hypertension 09/10/2015   OA (osteoarthritis) of knee 09/05/2015   Obesity 05/17/2015   OSA (obstructive sleep apnea) 03/16/2013   Iron deficiency 11/06/2012   Thrombocythemia 11/06/2012   Leukocytosis 11/05/2012   Impaired fasting glucose 07/23/2012   Bronchitis 04/03/2012   Kidney stone on left side 03/05/2012   Kidney cysts 03/04/2012   Loose stools 03/04/2012   Urinary frequency 12/04/2011   Restless leg syndrome 12/04/2011   Polypharmacy 12/04/2011   Bladder incontinence  12/04/2011   Fibromyalgia    Mixed hyperlipidemia    Edema    S/P endometrial ablation    Allergic rhinitis 09/18/2011   Depression, major, single episode, in partial remission ([place]) 04/21/2011   PRECORDIAL PAIN 02/17/2010   Anxiety state 01/30/2010   Migraine 01/30/2010   GERD without esophagitis 01/30/2010   Gastroparesis 01/30/2010   Home Medication(s) Prior to Admission medications   Medication Sig Start Date End Date Taking? Authorizing Provider  allopurinol (ZYLOPRIM) 300 MG tablet TAKE 1 TABLET BY MOUTH EVERY DAY 11/30/20  Yes Rita Ohara, MD  ALPRAZolam Duanne Moron) 0.25 MG tablet Take 1 tablet (0.25 mg total) by mouth 3 (three) times daily as needed for anxiety. 03/09/21  Yes Mozingo, Berdie Ogren, NP  ARIPiprazole (ABILIFY) 5 MG tablet Take 1 tablet (5 mg total) by mouth daily. 03/09/21  Yes Mozingo, Berdie Ogren, NP  Armodafinil 250 MG tablet TAKE 1 TABLET BY MOUTH EVERY DAY WITH BREAKFAST 05/01/21  Yes Deveshwar, Abel Presto, MD  atorvastatin (LIPITOR) 40 MG tablet TAKE 1 TABLET BY MOUTH EVERY DAY 04/28/21  Yes Rita Ohara, MD  CALCIUM PO Take 500 mg by mouth in the morning, at noon, and at bedtime.   Yes [provider]  colchicine 0.6 MG tablet TAKE 1 TABLET BY MOUTH DAILY AS NEEDED DURING GOUT FLARES. 09/28/19  Yes Deveshwar, Abel Presto, MD  diphenhydrAMINE (BENADRYL) 25 MG tablet Take 25 mg by mouth every 6 (six) hours as needed.   Yes [provider]  DULoxetine (CYMBALTA) 60 MG capsule Take 1 capsule (60 mg total) by mouth 2 (two) times daily. 03/09/21  Yes Mozingo, Berdie Ogren, NP  fluticasone (FLONASE) 50 MCG/ACT nasal spray PLACE 2 SPRAYS INTO BOTH NOSTRILS DAILY AS NEEDED FOR ALLERGIES 11/14/20  Yes Rita Ohara, MD  gabapentin (NEURONTIN) 300 MG capsule Take 300 mg by mouth 3 (three) times daily.   Yes [provider]  lidocaine (LIDODERM) 5 % Place 1 patch onto the skin daily. Remove & Discard patch within 12 hours or as directed by MD 07/04/21  Yes Tyashia Morrisette,  Grayce Sessions, MD  methocarbamol (ROBAXIN) 500 MG tablet TAKE 1 TABLET BY MOUTH EVERY DAY AS NEEDED 06/12/21  Yes Ofilia Neas, PA-C  metoprolol tartrate (LOPRESSOR) 50 MG tablet TAKE 1 TABLET BY MOUTH TWICE A DAY 06/20/21  Yes Rita Ohara, MD  Multiple Vitamins-Minerals (BARIATRIC MULTIVITAMINS/IRON PO) Take 2 each by mouth daily.   Yes [provider]  nortriptyline (PAMELOR) 50 MG capsule Take 100 mg by mouth at bedtime.   Yes [provider]  ondansetron (ZOFRAN) 8 MG tablet Take 8 mg by mouth every 8 (eight) hours as needed for nausea. 07/16/18  Yes [provider]  OXcarbazepine (TRILEPTAL) 150 MG tablet Take 150 mg by mouth 2 (two) times daily. 10/24/20  Yes [provider]  oxyCODONE (ROXICODONE) 5 MG immediate release tablet Take 0.5-1 tablets (2.5-5 mg total) by mouth every 6 (six) hours as needed for up to 5 days for severe pain or breakthrough  pain (for pain not controlled with scheduled Tylenol). 07/04/21 07/09/21 Yes Kollyn Lingafelter, Grayce Sessions, MD  pantoprazole (PROTONIX) 40 MG tablet Take 1 tablet (40 mg total) by mouth daily. 10/01/19  Yes Johnathan Hausen, MD  prochlorperazine (COMPAZINE) 10 MG tablet Take 10 mg by mouth as needed. 10/27/20  Yes [provider]  Propylene Glycol 0.6 % SOLN Place 1 drop into both eyes daily at 12 noon.   Yes [provider]  RESTASIS 0.05 % ophthalmic emulsion 1 drop 2 (two) times daily. 12/10/19  Yes [provider]  acetaminophen (TYLENOL) 500 MG tablet Take 1,000 mg by mouth every 8 (eight) hours as needed for moderate pain.    [provider]  cetirizine (ZYRTEC) 10 MG tablet Take 10 mg by mouth at bedtime.    [provider]  HYDROcodone-acetaminophen (NORCO/VICODIN) 5-325 MG tablet Take 1 tablet by mouth every 4 (four) hours as needed for moderate pain. [name] not taking: No sig reported 04/06/21   Edrick Kins, DPM  meclizine (ANTIVERT) 25 MG tablet Take 1 tablet (25 mg  total) by mouth 3 (three) times daily as needed for dizziness. 07/22/19   Rita Ohara, MD  Melatonin 10 MG TABS Take 5 mg by mouth at bedtime.     [provider]  Multiple Vitamins-Minerals (ICAPS AREDS 2) CAPS Take 1 capsule by mouth 2 (two) times daily.    [provider]  Probiotic Product ([employer]) CAPS Take 1 capsule by mouth at bedtime.     [provider]  promethazine (PHENERGAN) 25 MG tablet Take 25 mg by mouth as needed. [name] not taking: No sig reported 10/27/20   [provider]                                                                                                                                    Past Surgical History Past Surgical History:  Procedure Laterality Date   CHOLECYSTECTOMY  9/05   ENDOMETRIAL ABLATION  08/09/2006   Dr. Valentina Shaggy Ablation   FACET JOINT INJECTION  04/17/2017   Left L4-5 and L5-S1   GASTRIC ROUX-EN-Y N/A 09/29/2019   Procedure: LAPAROSCOPIC ROUX-EN-Y GASTRIC BYPASS WITH UPPER ENDOSCOPY, ERAS Pathway;  Surgeon: Johnathan Hausen, MD;  Location: [facility];  Service: General;  Laterality: N/A;   [place]   R knee, Dr. Eddie Dibbles, torn cartilage   RETINAL LASER PROCEDURE Right 08/28/2018   laser retinopexy   RIGHT/LEFT HEART CATH AND CORONARY ANGIOGRAPHY N/A 01/01/2018   Procedure: RIGHT/LEFT HEART CATH AND CORONARY ANGIOGRAPHY;  Surgeon: Sherren Mocha, MD;  Location: [facility];  Service: Cardiovascular;  Laterality: N/A;   TONSILLECTOMY  1968   TONSILLECTOMY     TOTAL KNEE ARTHROPLASTY Right 09/05/2015   Procedure: RIGHT TOTAL KNEE ARTHROPLASTY;  Surgeon: Gaynelle Arabian, MD;  Location: [facility];  Service: Orthopedics;  Laterality: Right;  TOTAL KNEE ARTHROPLASTY Left 07/14/2018   Procedure: LEFT TOTAL KNEE ARTHROPLASTY;  Surgeon: Gaynelle Arabian, MD;  Location: [facility];  Service: Orthopedics;  Laterality: Left;   ULTRASOUND GUIDANCE FOR VASCULAR ACCESS   01/01/2018   Procedure: Ultrasound Guidance For Vascular Access;  Surgeon: Sherren Mocha, MD;  Location: [place];  Service: Cardiovascular;;   UMBILICAL HERNIA REPAIR N/A 09/29/2019   Procedure: HERNIA REPAIR UMBILICAL ADULT;  Surgeon: Johnathan Hausen, MD;  Location: [facility];  Service: General;  Laterality: N/A;   Family History Family History  Problem Relation Age of Onset   Allergies Mother    Hypertension Mother    Heart disease Mother        possible valve problem - leaking valve   Macular degeneration Mother    Heart disease Father        pacemaker, CHF   Hypertension Father    Diabetes Father        borderline   Stroke Father 67   Kidney disease Father    Asthma Sister    Irritable bowel syndrome Sister    Allergies Sister    Heart disease Paternal Grandmother    Heart disease Paternal Grandfather    Cancer Maternal Aunt        leukemia   Cancer Maternal Aunt    Colon cancer Maternal Aunt        late 60's   Heart disease Maternal Grandmother    Heart disease Maternal Grandfather    CAD Neg Hx     Social History Social History   Tobacco Use   Smoking status: Never   Smokeless tobacco: Never  Vaping Use   Vaping Use: Never used  Substance Use Topics   Alcohol use: No    Alcohol/week: 0.0 standard drinks   Drug use: No   Allergies Erythromycin, Tramadol, and Dilaudid [hydromorphone hcl]  Review of Systems Review of Systems All other systems are reviewed and are negative for acute change except as noted in the HPI  Physical Exam Vital Signs  I have reviewed the triage vital signs BP 111/90 (BP Location: Right Arm)   Pulse 62   Temp 97.7 F (36.5 C) (Oral)   Resp 16   Ht '5\' 2"'$  (1.575 m)   Wt 79.4 kg   LMP 07/27/2006   SpO2 98%   BMI 32.01 kg/m   Physical Exam Vitals reviewed.  Constitutional:      General: [name] is not in acute distress.    Appearance: [name] is well-developed. [name] is not diaphoretic.  HENT:     Head: Normocephalic and  atraumatic.     Right Ear: External ear normal.     Left Ear: External ear normal.     Nose: Nose normal.  Eyes:     General: No scleral icterus.    Conjunctiva/sclera: Conjunctivae normal.  Neck:     Trachea: Phonation normal.  Cardiovascular:     Rate and Rhythm: Normal rate and regular rhythm.  Pulmonary:     Effort: Pulmonary effort is normal. No respiratory distress.     Breath sounds: No stridor.  Chest:     Chest wall: No tenderness.  Abdominal:     General: There is no distension.     Tenderness: There is no abdominal tenderness.  Musculoskeletal:        General: Normal range of motion.     Cervical back: Normal range of motion.  Neurological:     Mental Status: [name] is alert and  oriented to person, place, and time.  Psychiatric:        Behavior: Behavior normal.     ED Results and Treatments Labs (all labs ordered are listed, but only abnormal results are displayed) Labs Reviewed - No data to display                                                                                                                       EKG  EKG Interpretation  Date/Time:  Tuesday July 04 2021 03:31:55 EDT Ventricular Rate:  52 PR Interval:  214 QRS Duration: 117 QT Interval:  467 QTC Calculation: 435 R Axis:   85 Text Interpretation: Sinus rhythm Borderline prolonged PR interval Nonspecific intraventricular conduction delay No acute changes Confirmed by Addison Lank [phone]) on 07/04/2021 5:21:06 AM       Radiology DG Thoracic Spine 2 View  Result Date: 07/04/2021 CLINICAL DATA:  65 year old female status post fall 2 weeks ago with continued back pain. EXAM: THORACIC SPINE 2 VIEWS COMPARISON:  CTA chest 08/28/2018. No more recent chest or thoracic imaging. FINDINGS: Normal thoracic segmentation. Mild to moderate T8 compression fracture. More sclerotic appearing mild to moderate T12 (superior endplate) and L1 (inferior endplate) compression fractures. Similar loss of height at  each level. No high-grade retropulsion. Other thoracic levels appear intact. Cervicothoracic junction alignment is within normal limits. Grossly intact posterior ribs. Negative visible chest and abdominal visceral contours. Stable cholecystectomy clips. IMPRESSION: 1. Mild to moderate compression fractures of T8, T12, and L1. All are new since 08/28/2018, but the T8 level might be most recent. 2. No other acute osseous abnormality identified in the thoracic spine. Electronically Signed   By: Genevie Ann M.D.   On: 07/04/2021 05:01    Pertinent labs & imaging results that were available during my care of the [name] were reviewed by me and considered in my medical decision making (see MDM for details).  Medications Ordered in ED Medications  lidocaine (LIDODERM) 5 % 1 patch (1 patch Transdermal Patch Applied 07/04/21 0424)  acetaminophen (TYLENOL) tablet 650 mg (650 mg Oral Given 07/04/21 0423)                                                                                                                                     Procedures Procedures  (including critical care time)  Medical Decision Making / ED Course I have reviewed the nursing notes for  this encounter and the [name]'s prior records (if available in EHR or on provided paperwork).  CHERAY SCHNETTLER was evaluated in Emergency Department on 07/04/2021 for the symptoms described in the history of present illness. [name] was evaluated in the context of the global COVID-19 pandemic, which necessitated consideration that the [name] might be at risk for infection with the SARS-CoV-2 virus that causes COVID-19. Institutional protocols and algorithms that pertain to the evaluation of patients at risk for COVID-19 are in a state of rapid change based on information released by regulatory bodies including the CDC and federal and state organizations. These policies and algorithms were followed during the [name]'s care in the ED.     Unable to see x-ray from  [facility].  Thoracic spine plain film obtained and notable for chronic compression fractures with new T8 fracture.  Given the dermatomal distribution this is likely the source of her right-sided pain.  No evidence of pneumonia.  No tenderness to palpation concerning for intra-abdominal process.  [name] given dose of Tylenol and Lidoderm patch applied providing more significant relief.  Pertinent labs & imaging results that were available during my care of the [name] were reviewed by me and considered in my medical decision making:    Final Clinical Impression(s) / ED Diagnoses Final diagnoses:  Compression fracture of body of thoracic vertebra [facility])   The [name] appears reasonably screened and/or stabilized for discharge and I doubt any other medical condition or other [facility] requiring further screening, evaluation, or treatment in the ED at this time prior to discharge. Safe for discharge with strict return precautions.  Disposition: Discharge  Condition: Good  I have discussed the results, Dx and Tx plan with the [name]/family who expressed understanding and agree(s) with the plan. Discharge instructions discussed at length. The [name]/family was given strict return precautions who verbalized understanding of the instructions. No further questions at time of discharge.    ED Discharge Orders          Ordered    oxyCODONE (ROXICODONE) 5 MG immediate release tablet  Every 6 hours PRN        07/04/21 0538    lidocaine (LIDODERM) 5 %  Every 24 hours        07/04/21 [place] narcotic database reviewed and therapy change made.   Follow Up: [address] [place] [address] [phone]  Call  as scheduled  Rita Ohara, [place] [address] [phone]  Call  as needed for additional pain control     This chart was dictated using voice recognition software.  Despite best efforts to  proofread,  errors can occur which can change the documentation meaning.    Fatima Blank, MD 07/04/21 [phone]

## 2021-07-04 NOTE — ED Triage Notes (Signed)
  Patient comes in with R sided abdominal/rib pain.  Patient states she fell two weeks ago against a wall and broke a thoracic vertebrae.  Patient states she has been waiting to get fit for a back brace but started having the R sided pain over the past few days that got worse.  No pain when taking a deep breath.  Pain 8/10, sharp pain.

## 2021-07-04 NOTE — Discharge Instructions (Addendum)
For pain control you may take at 1000 mg of Tylenol every 8 hours scheduled.  In addition you can take 0.5 to 1 tablet of Oxycodone every 6 hours for pain not controlled with the scheduled Tylenol.

## 2021-07-05 ENCOUNTER — Telehealth: Payer: Self-pay | Admitting: Pharmacist

## 2021-07-05 ENCOUNTER — Ambulatory Visit: Payer: Medicare Other | Admitting: Physician Assistant

## 2021-07-05 ENCOUNTER — Ambulatory Visit: Payer: Medicare Other

## 2021-07-05 ENCOUNTER — Other Ambulatory Visit (HOSPITAL_COMMUNITY): Payer: Self-pay

## 2021-07-05 ENCOUNTER — Encounter: Payer: Self-pay | Admitting: Physician Assistant

## 2021-07-05 VITALS — BP 121/85 | HR 69 | Ht 62.5 in | Wt 173.6 lb

## 2021-07-05 DIAGNOSIS — M8589 Other specified disorders of bone density and structure, multiple sites: Secondary | ICD-10-CM

## 2021-07-05 DIAGNOSIS — M1A09X0 Idiopathic chronic gout, multiple sites, without tophus (tophi): Secondary | ICD-10-CM

## 2021-07-05 DIAGNOSIS — R5383 Other fatigue: Secondary | ICD-10-CM | POA: Diagnosis not present

## 2021-07-05 DIAGNOSIS — Z5181 Encounter for therapeutic drug level monitoring: Secondary | ICD-10-CM

## 2021-07-05 DIAGNOSIS — E559 Vitamin D deficiency, unspecified: Secondary | ICD-10-CM | POA: Diagnosis not present

## 2021-07-05 DIAGNOSIS — M51369 Other intervertebral disc degeneration, lumbar region without mention of lumbar back pain or lower extremity pain: Secondary | ICD-10-CM

## 2021-07-05 DIAGNOSIS — M19041 Primary osteoarthritis, right hand: Secondary | ICD-10-CM | POA: Diagnosis not present

## 2021-07-05 DIAGNOSIS — M160 Bilateral primary osteoarthritis of hip: Secondary | ICD-10-CM | POA: Diagnosis not present

## 2021-07-05 DIAGNOSIS — M62838 Other muscle spasm: Secondary | ICD-10-CM

## 2021-07-05 DIAGNOSIS — M8000XA Age-related osteoporosis with current pathological fracture, unspecified site, initial encounter for fracture: Secondary | ICD-10-CM

## 2021-07-05 DIAGNOSIS — Z8659 Personal history of other mental and behavioral disorders: Secondary | ICD-10-CM

## 2021-07-05 DIAGNOSIS — M5030 Other cervical disc degeneration, unspecified cervical region: Secondary | ICD-10-CM

## 2021-07-05 DIAGNOSIS — M5136 Other intervertebral disc degeneration, lumbar region: Secondary | ICD-10-CM

## 2021-07-05 DIAGNOSIS — Z96653 Presence of artificial knee joint, bilateral: Secondary | ICD-10-CM | POA: Diagnosis not present

## 2021-07-05 DIAGNOSIS — Z8781 Personal history of (healed) traumatic fracture: Secondary | ICD-10-CM | POA: Diagnosis not present

## 2021-07-05 DIAGNOSIS — M797 Fibromyalgia: Secondary | ICD-10-CM | POA: Diagnosis not present

## 2021-07-05 DIAGNOSIS — Z8669 Personal history of other diseases of the nervous system and sense organs: Secondary | ICD-10-CM

## 2021-07-05 DIAGNOSIS — M19042 Primary osteoarthritis, left hand: Secondary | ICD-10-CM

## 2021-07-05 NOTE — Telephone Encounter (Addendum)
Signed provider portion placed in pending PAP folder along with PA approval letter, insurance card copy, and med list  Spoke with husband and advised of copay - he states they cannot afford this.  We reviewed patient assistance application through [facility] for Forteo and that she can submit proof of medical expenses (husband estimates they have spent $25000 on medical care in the past year) to [facility] to appeal. Advised that we have had some success in past with these appeal documents. He verbalized understanding and will work with patient to gather documents and complete patient assistance application  Will f/u  Knox Saliva, PharmD, MPH, BCPS Clinical Pharmacist (Rheumatology and Pulmonology)

## 2021-07-05 NOTE — Telephone Encounter (Signed)
Submitted a Prior Authorization request to [facility] for FORTEO via CoverMyMeds. Will update once we receive a response.   Key: [id number] - PA Case ID: [id number]

## 2021-07-05 NOTE — Progress Notes (Signed)
Pharmacy Note  Subjective:  Patient presents today to [facility] for follow up office visit.   Patient was seen by the pharmacist for counseling on Forteo. She has history of multiple compression fractures in thoracic spine. She had ED visit yesterday for T8 compression fracture. She is naive to osteoporosis medications. She was seen by OB Dr. Fletcher Anon on 09/27/20 and decision was made for patient to continue taking Vitamin D and calcium without osteoporosis medications.  Objective: CMP     Component Value Date/Time   NA 133 (L) 06/28/2021 1043   K 5.0 06/28/2021 1043   CL 94 (L) 06/28/2021 1043   CO2 26 06/28/2021 1043   GLUCOSE 93 06/28/2021 1043   GLUCOSE 87 01/22/2021 0457   BUN 16 06/28/2021 1043   CREATININE 0.82 06/28/2021 1043   CREATININE 0.90 07/04/2020 1129   CALCIUM 9.1 06/28/2021 1043   PROT 6.3 06/28/2021 1043   ALBUMIN 3.9 06/28/2021 1043   AST 30 06/28/2021 1043   ALT 30 06/28/2021 1043   ALKPHOS 138 (H) 06/28/2021 1043   BILITOT 0.2 06/28/2021 1043   GFRNONAA >60 01/22/2021 0457   GFRNONAA 68 07/04/2020 1129   GFRAA 81 11/07/2020 1029   GFRAA 79 07/04/2020 1129    Vitamin D Lab Results  Component Value Date   VD25OH 58.6 05/02/2020    DEXA on 07/12/20: T score -2.0 at the right femoral neck.  Previous vertebral fracture after severe fall in 01/2020.  Assessment/Plan:  Because of her history of multiple compression fractures and despite her DEXA showing osteopenia range, she is a candidate for osteoporosis treatment.  Counseled patient on purpose, proper use, and adverse effects of Forteo.  Counseled patient that [place] is a medication that must be injected once daily.  Advised patient to take calcium 1200 mg daily and vitamin D 800 units daily which she does take.  Reviewed the most common adverse effects of Forteo including dizziness within 4 hours of taking dose, black box warning for risk of sarcoma based on animal studies, and  injection site reaction.   Provided patient with medication education material and answered all questions. Will apply for Forteo through patient's insurance.  She took patient assistance application with her to complete. She states she is likely over income threshold of $54,390 for patient assistance but she has been advised to start collecting information for medical bills that can be submitted as appeal process.   Knox Saliva, PharmD, MPH, BCPS Clinical Pharmacist (Rheumatology and Pulmonology)

## 2021-07-05 NOTE — Telephone Encounter (Signed)
Received notification from [facility] regarding a prior authorization for [name]. Authorization has been APPROVED from 07/05/21 to 11/25/21.   Authorization # [id number]  Ran test claim, patient's copay for 1 month supply is $1,059.33

## 2021-07-05 NOTE — Telephone Encounter (Signed)
Please start Forteo BIV.  Dose: 16mg every day (1 pen per 28 days)  Dx: M80.00XA (age related osteoporosis with current pathological fracture)  History of vertebral compression fractures, current compression fracture  Patient took patient assistance application home to complete. She believes she is above income threshold and we reviewed the ability to appeal to [place] proof [place] medical bills. She will plan to start collecting these docs to submit  DKnox Saliva PharmD, MPH, BCPS Clinical Pharmacist (Rheumatology and Pulmonology)

## 2021-07-07 ENCOUNTER — Telehealth: Payer: Self-pay

## 2021-07-07 ENCOUNTER — Other Ambulatory Visit: Payer: Self-pay | Admitting: Family Medicine

## 2021-07-07 DIAGNOSIS — Z1231 Encounter for screening mammogram for malignant neoplasm of breast: Secondary | ICD-10-CM

## 2021-07-07 LAB — PROTEIN ELECTROPHORESIS, SERUM, WITH REFLEX
Albumin ELP: 4.1 g/dL (ref 3.8–4.8)
Alpha 1: 0.3 g/dL (ref 0.2–0.3)
Alpha 2: 0.8 g/dL (ref 0.5–0.9)
Beta 2: 0.3 g/dL (ref 0.2–0.5)
Beta Globulin: 0.5 g/dL (ref 0.4–0.6)
Gamma Globulin: 1 g/dL (ref 0.8–1.7)
Total Protein: 7.1 g/dL (ref 6.1–8.1)

## 2021-07-07 LAB — PHOSPHORUS: Phosphorus: 4.8 mg/dL — ABNORMAL HIGH (ref 2.5–4.5)

## 2021-07-07 LAB — PARATHYROID HORMONE, INTACT (NO CA): PTH: 54 pg/mL (ref 16–77)

## 2021-07-07 LAB — VITAMIN D 25 HYDROXY (VIT D DEFICIENCY, FRACTURES): Vit D, 25-Hydroxy: 60 ng/mL (ref 30–100)

## 2021-07-07 NOTE — Telephone Encounter (Signed)
Returned call to patient and advised that we can submit initial application to [employer] without income documents and once denial letter is received, we may submit appeal with receipts/bills thereafter. She verbalized understanding and will plan to drop off application once completed.  Knox Saliva, PharmD, MPH, BCPS Clinical Pharmacist (Rheumatology and Pulmonology)

## 2021-07-07 NOTE — Telephone Encounter (Signed)
Patient called stating she has additional questions regarding the patient assistance application.  Patient requested a return call.

## 2021-07-10 DIAGNOSIS — M546 Pain in thoracic spine: Secondary | ICD-10-CM | POA: Diagnosis not present

## 2021-07-10 DIAGNOSIS — M4854XD Collapsed vertebra, not elsewhere classified, thoracic region, subsequent encounter for fracture with routine healing: Secondary | ICD-10-CM | POA: Diagnosis not present

## 2021-07-10 DIAGNOSIS — R0781 Pleurodynia: Secondary | ICD-10-CM | POA: Diagnosis not present

## 2021-07-10 NOTE — Progress Notes (Signed)
Vitamin D is normal at 60, PTH and SPEP are normal.  Phosphorus mildly elevated not significant.

## 2021-07-10 NOTE — Telephone Encounter (Signed)
Submitted Patient Assistance Application to [employer] for [place] along with signed patient and provider portions, PA, med list, and insurance card copy. Will update patient when we receive a response.  Copy sent to scan center  Fax# [phone] Phone# [phone]  Knox Saliva, PharmD, MPH, BCPS Clinical Pharmacist (Rheumatology and Pulmonology)

## 2021-07-13 DIAGNOSIS — M546 Pain in thoracic spine: Secondary | ICD-10-CM | POA: Diagnosis not present

## 2021-07-14 DIAGNOSIS — M546 Pain in thoracic spine: Secondary | ICD-10-CM | POA: Diagnosis not present

## 2021-07-19 ENCOUNTER — Other Ambulatory Visit: Payer: Self-pay | Admitting: Family Medicine

## 2021-07-19 ENCOUNTER — Telehealth: Payer: Self-pay

## 2021-07-19 DIAGNOSIS — E785 Hyperlipidemia, unspecified: Secondary | ICD-10-CM

## 2021-07-19 NOTE — Telephone Encounter (Signed)
Patient called stating she was returning Devki's call.

## 2021-07-19 NOTE — Telephone Encounter (Signed)
Called [employer] for update on Forteo application. Per rep, application was denied on 07/11/21. Requested denial letter be refaxed to our clinic.  Called patient to advise that we need to submit appeal. Patient and husband were in the process of collecting income documents to submit to [facility]. Unable to reach left VM requesting return call  Knox Saliva, PharmD, MPH, BCPS Clinical Pharmacist (Rheumatology and Pulmonology)

## 2021-07-20 NOTE — Telephone Encounter (Signed)
Returned call to patient but unable to reach. Left VM requesting return call to review Forteo  Knox Saliva, PharmD, MPH, BCPS Clinical Pharmacist (Rheumatology and Pulmonology)

## 2021-07-20 NOTE — Telephone Encounter (Signed)
Returned call to patient and unable to reach. Will f/u in [place] encounter

## 2021-07-21 ENCOUNTER — Telehealth: Payer: Self-pay

## 2021-07-21 NOTE — Telephone Encounter (Signed)
Returned call to patient to review what she may submit for Forteo appeal. She will collect and bring receipts/bills. Emailed draft of appeal letter to patient's email on file. Patient advised to complete letter and print and bring to office with receipts fr appeal submission.  F/u in Forteo encounter  Knox Saliva, PharmD, MPH, BCPS Clinical Pharmacist (Rheumatology and Pulmonology)

## 2021-07-21 NOTE — Telephone Encounter (Signed)
Patient left a voicemail stating she was returning Devki's call.   °

## 2021-07-21 NOTE — Telephone Encounter (Signed)
Returned call to patient to review what she may submit for Forteo appeal. She will collect and bring receipts/bills. Emailed draft of appeal letter to patient's email on file. Patient advised to complete letter and print and bring to office with receipts fr appeal submission.  F/u in Forteo encounter  She plans to collect and return to clinic next week  Knox Saliva, PharmD, MPH, BCPS Clinical Pharmacist (Rheumatology and Pulmonology)

## 2021-07-23 ENCOUNTER — Other Ambulatory Visit: Payer: Self-pay | Admitting: Family Medicine

## 2021-07-26 ENCOUNTER — Other Ambulatory Visit: Payer: Self-pay | Admitting: Rheumatology

## 2021-07-26 NOTE — Telephone Encounter (Signed)
Next Visit: 10/04/2021  Last Visit: 07/05/2021  Last Fill: 05/01/2021  Dx: Other fatigue   Current Dose per office note on 05/01/2021: armodafinil 250 mg daily with breakfast  Okay to refill Armodafinil?

## 2021-07-26 NOTE — Telephone Encounter (Signed)
Forteo PAP appeal letter and income documents faxed to [facility]  Reference # : [id number] Phone: [phone] Fax: [phone]  Knox Saliva, PharmD, MPH, BCPS Clinical Pharmacist (Rheumatology and Pulmonology)

## 2021-07-27 MED ORDER — FORTEO 600 MCG/2.4ML ~~LOC~~ SOPN: 20.0000 ug | PEN_INJECTOR | Freq: Every day | SUBCUTANEOUS | 0 refills | Status: DC

## 2021-07-27 MED ORDER — INSULIN PEN NEEDLE 31G X 5 MM MISC: 2 refills | Status: DC

## 2021-07-27 NOTE — Telephone Encounter (Signed)
Received a fax from  [facility] regarding an approval for [place] patient assistance from 07/26/21 to 11/25/21.   Future refills for Forteo can be sent to [facility]. Rx for pen needles sent to pharmacy  Called patient to advise. She expressed gratitude. Provided her with phone number to reach out to [place] to schedule shipment of Forteo (Phone: [phone]). She has been advised that [employer] will mail renewal application to her home in early November for renewal. Advised that rx for pen needles must be picked up from local pharmacy (rx sent to [facility]).  We reviewed Forteo administration at [place] on 07/05/21 with demo pen, but patient expresses some nervous. I have advised her to read directions when she gets Forteo at home and if she still feels nervous, I would be more than happy to complete first injection with her in the clinic. She verbalized understanding and will reach out once she receives Forteo at home. We reviewed that pen is good for 28 days only from first injection and that medication goes into the refrigerator after each injection.  Knox Saliva, PharmD, MPH, BCPS Clinical Pharmacist (Rheumatology and Pulmonology)

## 2021-07-27 NOTE — Telephone Encounter (Signed)
Income docs placed in pharmacy office for [id number] renewal application so that patient does not have to recollect in 3 months  Knox Saliva, PharmD, MPH, BCPS Clinical Pharmacist (Rheumatology and Pulmonology)

## 2021-07-28 DIAGNOSIS — M4854XD Collapsed vertebra, not elsewhere classified, thoracic region, subsequent encounter for fracture with routine healing: Secondary | ICD-10-CM | POA: Diagnosis not present

## 2021-07-28 DIAGNOSIS — M542 Cervicalgia: Secondary | ICD-10-CM | POA: Diagnosis not present

## 2021-08-04 ENCOUNTER — Telehealth: Payer: Self-pay | Admitting: Rheumatology

## 2021-08-04 DIAGNOSIS — M8580 Other specified disorders of bone density and structure, unspecified site: Secondary | ICD-10-CM | POA: Insufficient documentation

## 2021-08-04 NOTE — Telephone Encounter (Signed)
Patient left a message stating she received her Forteo. Please call to schedule her new start appointment.

## 2021-08-07 ENCOUNTER — Ambulatory Visit: Payer: Medicare Other | Admitting: Pharmacist

## 2021-08-07 ENCOUNTER — Other Ambulatory Visit: Payer: Self-pay

## 2021-08-07 DIAGNOSIS — M8000XA Age-related osteoporosis with current pathological fracture, unspecified site, initial encounter for fracture: Secondary | ICD-10-CM

## 2021-08-07 DIAGNOSIS — Z79899 Other long term (current) drug therapy: Secondary | ICD-10-CM

## 2021-08-07 DIAGNOSIS — Z7189 Other specified counseling: Secondary | ICD-10-CM

## 2021-08-07 NOTE — Telephone Encounter (Signed)
Patient to stop by clinic 08/07/21 @ 10:30am for Forteo injection training  Knox Saliva, PharmD, MPH, BCPS Clinical Pharmacist (Rheumatology and Pulmonology)

## 2021-08-07 NOTE — Progress Notes (Signed)
Pharmacy Note  Subjective:   Patient presents to clinic today to receive first dose of Forteo.  She was evaluated in the ED on 07/04/2021.  Thoracic x-rays were obtained which revealed several chronic compression fractures and a new T8 compression fracture.  Patient have any upcoming invasive procedures/surgeries? No  Objective: CMP     Component Value Date/Time   NA 133 (L) 06/28/2021 1043   K 5.0 06/28/2021 1043   CL 94 (L) 06/28/2021 1043   CO2 26 06/28/2021 1043   GLUCOSE 93 06/28/2021 1043   GLUCOSE 87 01/22/2021 0457   BUN 16 06/28/2021 1043   CREATININE 0.82 06/28/2021 1043   CREATININE 0.90 07/04/2020 1129   CALCIUM 9.1 06/28/2021 1043   PROT 7.1 07/05/2021 0939   PROT 6.3 06/28/2021 1043   ALBUMIN 3.9 06/28/2021 1043   AST 30 06/28/2021 1043   ALT 30 06/28/2021 1043   ALKPHOS 138 (H) 06/28/2021 1043   BILITOT 0.2 06/28/2021 1043   GFRNONAA >60 01/22/2021 0457   GFRNONAA 68 07/04/2020 1129   GFRAA 81 11/07/2020 1029   GFRAA 79 07/04/2020 1129    CBC    Component Value Date/Time   WBC 8.3 06/28/2021 1043   WBC 8.9 01/21/2021 2358   RBC 4.35 06/28/2021 1043   RBC 4.19 01/21/2021 2358   HGB 13.0 06/28/2021 1043   HGB 11.7 05/02/2011 1412   HCT 39.7 06/28/2021 1043   HCT 37.1 05/02/2011 1412   PLT 370 06/28/2021 1043   MCV 91 06/28/2021 1043   MCV 94.4 05/02/2011 1412   MCH 29.9 06/28/2021 1043   MCH 30.3 01/21/2021 2358   MCHC 32.7 06/28/2021 1043   MCHC 32.0 01/21/2021 2358   RDW 12.7 06/28/2021 1043   RDW 18.3 (H) 05/02/2011 1412   LYMPHSABS 2.2 06/28/2021 1043   LYMPHSABS 2.4 05/02/2011 1412   MONOABS 0.9 01/21/2021 2358   MONOABS 0.8 05/02/2011 1412   EOSABS 0.3 06/28/2021 1043   BASOSABS 0.1 06/28/2021 1043   BASOSABS 0.1 05/02/2011 1412    Baseline Immunosuppressant Therapy Labs TB GOLD   Hepatitis Panel Hepatitis Latest Ref Rng & Units 12/07/2015  Hep C Ab NEGATIVE NEGATIVE   HIV Lab Results  Component Value Date   HIV Non Reactive  01/22/2021   HIV Non Reactive 12/06/2017   Immunoglobulins Immunoglobulin Electrophoresis Latest Ref Rng & Units 07/27/2015  IgG 690 - 1,700 mg/dL 1,150  IgM 52 - 322 mg/dL 144   SPEP Serum Protein Electrophoresis Latest Ref Rng & Units 07/05/2021  Total Protein 6.1 - 8.1 g/dL 7.1  Albumin 3.8 - 4.8 g/dL 4.1  Alpha-1 0.2 - 0.3 g/dL 0.3  Alpha-2 0.5 - 0.9 g/dL 0.8  Beta Globulin 0.4 - 0.6 g/dL 0.5  Beta 2 0.2 - 0.5 g/dL 0.3  Gamma Globulin 0.8 - 1.7 g/dL 1.0  Interpretation - -   DEXA: DEXA 07/12/2020 reviewed today in the office: Right femoral neck: BMD 0.632 with T score -2.0.  History of multiple vertebral fractures and bariatric surgery in 2020.  Discussed that she meets criteria for osteoporosis since she has had multiple vertebral fractures.   Assessment/Plan:  Demonstrated proper injection technique with Forteo demo device  We reviewed storage and we reviewed 28 day expiration date from first use. She is [place] to go to the beach for [place] trip and we reviewed that pen is good for 36 hours at room temperature, however she plans to take medication in cooler with her. We reviewed injection sites including lower abdomen and  upper thigh. Reviewed alternating injection sites with daily doses.  Patient able to demonstrate proper injection technique using the teach back method.  Patient self injected in the left thigh with:  Patient arrived with Forteo medication shipped from [place] and pen needle. She has four Forteo pens that she has received and also picked up pen needles from pharmacy. She was provided with a printout of Forteo injection instructions for use today.  Patient tolerated well and demonstrated appropriate injection technique.   Forteo approved through patient assistance through 11/25/21.   Rx already sent to: [facility] for Forteo/[place]: [phone].   All questions encouraged and answered.  Instructed patient to call with any further questions or concerns.  Knox Saliva, PharmD, MPH, BCPS Clinical Pharmacist (Rheumatology and Pulmonology)  08/07/2021 10:57 AM

## 2021-08-15 DIAGNOSIS — M4854XD Collapsed vertebra, not elsewhere classified, thoracic region, subsequent encounter for fracture with routine healing: Secondary | ICD-10-CM | POA: Diagnosis not present

## 2021-08-24 ENCOUNTER — Other Ambulatory Visit: Payer: Self-pay

## 2021-08-24 ENCOUNTER — Ambulatory Visit (INDEPENDENT_AMBULATORY_CARE_PROVIDER_SITE_OTHER): Payer: Medicare Other

## 2021-08-24 ENCOUNTER — Ambulatory Visit: Payer: Medicare Other | Admitting: Podiatry

## 2021-08-24 DIAGNOSIS — M85679 Other cyst of bone, unspecified ankle and foot: Secondary | ICD-10-CM

## 2021-08-24 DIAGNOSIS — M7989 Other specified soft tissue disorders: Secondary | ICD-10-CM

## 2021-08-24 DIAGNOSIS — M79674 Pain in right toe(s): Secondary | ICD-10-CM | POA: Diagnosis not present

## 2021-08-24 MED ORDER — COLCHICINE 0.6 MG PO TABS: ORAL_TABLET | ORAL | 2 refills | Status: DC

## 2021-08-24 MED ORDER — DOXYCYCLINE HYCLATE 100 MG PO TABS: 100.0000 mg | ORAL_TABLET | Freq: Two times a day (BID) | ORAL | 0 refills | Status: DC

## 2021-08-25 LAB — BASIC METABOLIC PANEL
BUN/Creatinine Ratio: 21 (ref 12–28)
BUN: 20 mg/dL (ref 8–27)
CO2: 28 mmol/L (ref 20–29)
Calcium: 8.9 mg/dL (ref 8.7–10.3)
Chloride: 98 mmol/L (ref 96–106)
Creatinine, Ser: 0.97 mg/dL (ref 0.57–1.00)
Glucose: 95 mg/dL (ref 70–99)
Potassium: 5 mmol/L (ref 3.5–5.2)
Sodium: 140 mmol/L (ref 134–144)
eGFR: 65 mL/min/{1.73_m2} (ref >59)

## 2021-08-25 LAB — CBC WITH DIFFERENTIAL/PLATELET
Basophils Absolute: 0.1 10*3/uL (ref 0.0–0.2)
Basos: 1 %
EOS (ABSOLUTE): 0.2 10*3/uL (ref 0.0–0.4)
Eos: 2 %
Hematocrit: 38.2 % (ref 34.0–46.6)
Hemoglobin: 12.6 g/dL (ref 11.1–15.9)
Immature Grans (Abs): 0 10*3/uL (ref 0.0–0.1)
Immature Granulocytes: 0 %
Lymphocytes Absolute: 3.1 10*3/uL (ref 0.7–3.1)
Lymphs: 37 %
MCH: 29.9 pg (ref 26.6–33.0)
MCHC: 33 g/dL (ref 31.5–35.7)
MCV: 91 fL (ref 79–97)
Monocytes Absolute: 0.9 10*3/uL (ref 0.1–0.9)
Monocytes: 11 %
Neutrophils Absolute: 4 10*3/uL (ref 1.4–7.0)
Neutrophils: 49 %
Platelets: 384 10*3/uL (ref 150–450)
RBC: 4.21 x10E6/uL (ref 3.77–5.28)
RDW: 13.2 % (ref 11.7–15.4)
WBC: 8.3 10*3/uL (ref 3.4–10.8)

## 2021-08-25 LAB — SEDIMENTATION RATE: Sed Rate: 36 mm/hr (ref 0–40)

## 2021-08-25 LAB — URIC ACID: Uric Acid: 5.2 mg/dL (ref 3.0–7.2)

## 2021-08-25 NOTE — Progress Notes (Signed)
  Subjective:  Patient ID: [name], female    DOB: [birth year],  MRN: [id number]  Chief Complaint  Patient presents with   Toe Pain      right foot great toe swelling/redness/warm to touch    65 y.o. female presents with the above complaint. History confirmed with patient.  She previously had this toe biopsied by Dr. Amalia Hailey in the spring which showed no signs of bone infection  Objective:  Physical Exam: warm, good capillary refill, no trophic changes or ulcerative lesions, normal DP and PT pulses, and normal sensory exam.  Right Foot: Right hallux IPJ erythematous to touch painful and limited range of motion  No images are attached to the encounter.  Radiographs: Multiple views x-ray of the left foot: Severe osteoarthrosis with subchondral cystic changes of the IPJ Assessment:   1. Bone cyst of foot   2. Swelling of toe of right foot   3. Pain and swelling of toe of right foot      Plan:  Patient was evaluated and treated and all questions answered.  I suspect this is secondary to her chronic recurrent gout and she is having a low-grade gout flare with arthritic inflammation.  I am putting her on doxycycline as a precaution in the event that this is a septic arthritis.  I have ordered her lab work for inflammatory markers and uric acid.  Colchicine was also prescribed.  She will follow-up in about a month.  I think she likely will need an IPJ fusion of this  Return in about 1 month (around 09/23/2021) for right toe arthritis and gout .

## 2021-09-08 ENCOUNTER — Encounter: Payer: Self-pay | Admitting: Adult Health

## 2021-09-08 ENCOUNTER — Ambulatory Visit: Payer: Medicare Other | Admitting: Adult Health

## 2021-09-08 ENCOUNTER — Other Ambulatory Visit: Payer: Self-pay

## 2021-09-08 DIAGNOSIS — F411 Generalized anxiety disorder: Secondary | ICD-10-CM | POA: Diagnosis not present

## 2021-09-08 DIAGNOSIS — F331 Major depressive disorder, recurrent, moderate: Secondary | ICD-10-CM

## 2021-09-08 DIAGNOSIS — G4700 Insomnia, unspecified: Secondary | ICD-10-CM

## 2021-09-08 DIAGNOSIS — M5136 Other intervertebral disc degeneration, lumbar region: Secondary | ICD-10-CM | POA: Diagnosis not present

## 2021-09-08 DIAGNOSIS — M4854XD Collapsed vertebra, not elsewhere classified, thoracic region, subsequent encounter for fracture with routine healing: Secondary | ICD-10-CM | POA: Diagnosis not present

## 2021-09-08 MED ORDER — ALPRAZOLAM 0.25 MG PO TABS: 0.2500 mg | ORAL_TABLET | Freq: Three times a day (TID) | ORAL | 2 refills | Status: DC | PRN

## 2021-09-08 MED ORDER — DULOXETINE HCL 60 MG PO CPEP: 60.0000 mg | ORAL_CAPSULE | Freq: Two times a day (BID) | ORAL | 1 refills | Status: DC

## 2021-09-08 MED ORDER — ARIPIPRAZOLE 5 MG PO TABS: 5.0000 mg | ORAL_TABLET | Freq: Every day | ORAL | 1 refills | Status: DC

## 2021-09-08 NOTE — Progress Notes (Signed)
[name] [id number] 01-Apr-1956 66 y.o.  Subjective:   Patient ID:  [name] is a 65 y.o. (DOB Apr 15, 1956) female.  Chief Complaint: No chief complaint on file.   HPI [name] presents to the office today for follow-up of anxiety, depression, and insomnia.  Describes mood today as "ok". Pleasant. Mood symptoms - reports some depression and anxiety with limited mobility.  Denies irritability. Stating "I've been frustrated". Recovering from a fractured vertebrae. Has osteoporosis and is taking an injection every day. Working with neurologist. Has not been able to exercise as much. Working with weight loss program. Stable interest and motivation. Taking medications as prescribed.  Energy levels lower. Active, does not have a regular exercise routine with current physical disabilities. Trying to walk.  Enjoys some usual interests and activities. Married. Lives with husband of 36 years. Has 2 sons - one local and another one in [place]. Mother local. Spending time with family. Appetite ade quate. Weight gain with inactivity - 175 pounds previous 170 pounds. Sleeps better some nights than others. Averages 5 hours. Focus and concentration stable. Completing tasks. Managing aspects of household.  Denies SI or HI.  Denies AH or VH. Bariatric surgery 11/20.    PHQ2-9    Flowsheet Row Office Visit from 06/28/2021 in [facility] Office Visit from 01/26/2021 in [facility] Clinical Support from 11/07/2020 in [facility] Office Visit from 05/02/2020 in [facility] Office Visit from 04/15/2019 in [facility]  PHQ-2 Total Score 1 0 0 0 0  PHQ-9 Total Score 7 -- -- -- --      Flowsheet Row ED from 07/04/2021 in [facility] ED to Hosp-Admission (Discharged) from 01/21/2021 in [facility]  C-SSRS RISK CATEGORY No Risk No Risk        Review of Systems:  Review of Systems   Musculoskeletal:  Negative for gait problem.  Neurological:  Negative for tremors.  Psychiatric/Behavioral:         Please refer to HPI   Medications: I have reviewed the patient's current medications.  Current Outpatient Medications  Medication Sig Dispense Refill   acetaminophen (TYLENOL) 500 MG tablet Take 1,000 mg by mouth every 8 (eight) hours as needed for moderate pain.     allopurinol (ZYLOPRIM) 300 MG tablet TAKE 1 TABLET BY MOUTH EVERY DAY 90 tablet 1   ALPRAZolam (XANAX) 0.25 MG tablet Take 1 tablet (0.25 mg total) by mouth 3 (three) times daily as needed for anxiety. 90 tablet 2   ARIPiprazole (ABILIFY) 5 MG tablet Take 1 tablet (5 mg total) by mouth daily. 90 tablet 1   Armodafinil 250 MG tablet TAKE 1 TABLET BY MOUTH EVERY DAY WITH BREAKFAST 30 tablet 2   atorvastatin (LIPITOR) 40 MG tablet TAKE 1 TABLET BY MOUTH EVERY DAY 90 tablet 1   CALCIUM PO Take 500 mg by mouth in the morning, at noon, and at bedtime.     cetirizine (ZYRTEC) 10 MG tablet Take 10 mg by mouth at bedtime.     colchicine 0.6 MG tablet Take 1.2mg  (2 tablets) then 0.6mg  (1 tablet) 1 hour after. Then, take 1 tablet every day for 7 days. 10 tablet 2   diphenhydrAMINE (BENADRYL) 25 MG tablet Take 25 mg by mouth every 6 (six) hours as needed.     doxycycline (VIBRA-TABS) 100 MG tablet Take 1 tablet (100 mg total) by mouth 2 (two) times daily. 20 tablet 0   DULoxetine (  CYMBALTA) 60 MG capsule Take 1 capsule (60 mg total) by mouth 2 (two) times daily. 180 capsule 1   fluticasone (FLONASE) 50 MCG/ACT nasal spray PLACE 2 SPRAYS INTO BOTH NOSTRILS DAILY AS NEEDED FOR ALLERGIES 16 mL 2   gabapentin (NEURONTIN) 300 MG capsule Take 300 mg by mouth 3 (three) times daily.     HYDROcodone-acetaminophen (NORCO/VICODIN) 5-325 MG tablet Take 1 tablet by mouth every 4 (four) hours as needed for moderate pain. (Patient not taking: No sig reported) 30 tablet 0   Insulin Pen Needle 31G X 5 MM MISC Use to inject Forteo once daily.  DISCARD AFTER USE. 100 each 2   lidocaine (LIDODERM) 5 % Place 1 patch onto the skin daily. Remove & Discard patch within 12 hours or as directed by MD 30 patch 0   meclizine (ANTIVERT) 25 MG tablet Take 1 tablet (25 mg total) by mouth 3 (three) times daily as needed for dizziness. 30 tablet 0   Melatonin 10 MG TABS Take 5 mg by mouth at bedtime.      methocarbamol (ROBAXIN) 500 MG tablet TAKE 1 TABLET BY MOUTH EVERY DAY AS NEEDED 30 tablet 2   metoprolol tartrate (LOPRESSOR) 50 MG tablet TAKE 1 TABLET BY MOUTH TWICE A DAY 180 tablet 0   Multiple Vitamins-Minerals (BARIATRIC MULTIVITAMINS/IRON PO) Take 2 each by mouth daily.     Multiple Vitamins-Minerals (ICAPS AREDS 2) CAPS Take 1 capsule by mouth 2 (two) times daily.     nortriptyline (PAMELOR) 50 MG capsule Take 100 mg by mouth at bedtime.     ondansetron (ZOFRAN) 8 MG tablet Take 8 mg by mouth every 8 (eight) hours as needed for nausea.  0   OXcarbazepine (TRILEPTAL) 150 MG tablet Take 150 mg by mouth 2 (two) times daily.     pantoprazole (PROTONIX) 40 MG tablet Take 1 tablet (40 mg total) by mouth daily. 90 tablet 0   Probiotic Product (PHILLIPS COLON HEALTH) CAPS Take 1 capsule by mouth at bedtime.      prochlorperazine (COMPAZINE) 10 MG tablet Take 10 mg by mouth as needed.     promethazine (PHENERGAN) 25 MG tablet Take 25 mg by mouth as needed.     Propylene Glycol 0.6 % SOLN Place 1 drop into both eyes daily at 12 noon.     RESTASIS 0.05 % ophthalmic emulsion 1 drop 2 (two) times daily.     Teriparatide, Recombinant, (FORTEO) 600 MCG/2.4ML SOPN Inject 20 mcg into the skin daily. 9.6 mL 0   No current facility-administered medications for this visit.    Medication Side Effects: None  Allergies:  Allergies  Allergen Reactions   Erythromycin Nausea Only    Abdominal pain   Tramadol Itching   Dilaudid [Hydromorphone Hcl] Itching    Past Medical History:  Diagnosis Date   Anemia    previously followed by Dr. Dalene Carrow for anemia  and elevated platelets   Anxiety    C. difficile colitis 10/01/2012   treated by [facility]   Chronic fatigue syndrome    DDD (degenerative disc disease), lumbar 08/19/2014   and facet arthroplasty & left lumbar radiculopathy (Dr.Ramos)   Depression    Dyssynergia    dyssynergenic defecation, contributing to fecal incontinence.   Edema    Fibromyalgia    Gastroparesis    followed at [facility]   GERD (gastroesophageal reflux disease)    History of kidney stones    History of vertebral fracture 06/30/2021   Hyperlipidemia  Kidney stone    Lumbar radiculopathy    Migraine    Neuropathy    Obstructive sleep apnea    Does  wear  CPAP   Paresthesia    Dr. Antonietta Barcelona at [facility]   Pelvic floor dysfunction    pelvic floor dyssynergy   Plantar fasciitis 02/2011   R foot   Pneumonia    2012   PONV (postoperative nausea and vomiting)    pt states has gastroparesis has difficulty taking antibiotics and narcotics has severe nausea and vomiting    Restless leg syndrome    S/P endometrial ablation 08/09/2006   Novasure Ablation   S/P epidural steroid injection 09/20/2014   Dr.Ramos   Tremor    Dr. Antonietta Barcelona   Urinary frequency    Urinary incontinence     Past Medical History, Surgical history, Social history, and Family history were reviewed and updated as appropriate.   Please see review of systems for further details on the patient's review from today.   Objective:   Physical Exam:  LMP 07/27/2006   Physical Exam Constitutional:      General: She is not in acute distress. Musculoskeletal:        General: No deformity.  Neurological:     Mental Status: She is alert and oriented to person, place, and time.     Coordination: Coordination normal.  Psychiatric:        Attention and Perception: Attention and perception normal. She does not perceive auditory or visual hallucinations.        Mood and Affect: Mood normal. Mood is not anxious or depressed. Affect is not labile,  blunt, angry or inappropriate.        Speech: Speech normal.        Behavior: Behavior normal.        Thought Content: Thought content normal. Thought content is not paranoid or delusional. Thought content does not include homicidal or suicidal ideation. Thought content does not include homicidal or suicidal plan.        Cognition and Memory: Cognition and memory normal.        Judgment: Judgment normal.     Comments: Insight intact    Lab Review:     Component Value Date/Time   NA 140 08/24/2021 1522   K 5.0 08/24/2021 1522   CL 98 08/24/2021 1522   CO2 28 08/24/2021 1522   GLUCOSE 95 08/24/2021 1522   GLUCOSE 87 01/22/2021 0457   BUN 20 08/24/2021 1522   CREATININE 0.97 08/24/2021 1522   CREATININE 0.90 07/04/2020 1129   CALCIUM 8.9 08/24/2021 1522   PROT 7.1 07/05/2021 0939   PROT 6.3 06/28/2021 1043   ALBUMIN 3.9 06/28/2021 1043   AST 30 06/28/2021 1043   ALT 30 06/28/2021 1043   ALKPHOS 138 (H) 06/28/2021 1043   BILITOT 0.2 06/28/2021 1043   GFRNONAA >60 01/22/2021 0457   GFRNONAA 68 07/04/2020 1129   GFRAA 81 11/07/2020 1029   GFRAA 79 07/04/2020 1129       Component Value Date/Time   WBC 8.3 08/24/2021 1522   WBC 8.9 01/21/2021 2358   RBC 4.21 08/24/2021 1522   RBC 4.19 01/21/2021 2358   HGB 12.6 08/24/2021 1522   HGB 11.7 05/02/2011 1412   HCT 38.2 08/24/2021 1522   HCT 37.1 05/02/2011 1412   PLT 384 08/24/2021 1522   MCV 91 08/24/2021 1522   MCV 94.4 05/02/2011 1412   MCH 29.9 08/24/2021 1522   MCH 30.3 01/21/2021 2358  MCHC 33.0 08/24/2021 1522   MCHC 32.0 01/21/2021 2358   RDW 13.2 08/24/2021 1522   RDW 18.3 (H) 05/02/2011 1412   LYMPHSABS 3.1 08/24/2021 1522   LYMPHSABS 2.4 05/02/2011 1412   MONOABS 0.9 01/21/2021 2358   MONOABS 0.8 05/02/2011 1412   EOSABS 0.2 08/24/2021 1522   BASOSABS 0.1 08/24/2021 1522   BASOSABS 0.1 05/02/2011 1412    No results found for: POCLITH, LITHIUM   No results found for: PHENYTOIN, PHENOBARB, VALPROATE, CBMZ    .res Assessment: Plan:    Assessment: Plan:    Plan:  1. Xanax 0.25mg  TID 2. Cymbalta 60mg  BID 3. Abilify 5mg  daily  Melatonin PRN  RTC 6 months  Patient advised to contact office with any questions, adverse effects, or acute worsening in signs and symptoms.  Discussed potential metabolic side effects associated with atypical antipsychotics, as well as potential risk for movement side effects. Advised pt to contact office if movement side effects occur.   Discussed potential metabolic side effects associated with atypical antipsychotics, as well as potential risk for movement side effects. Advised pt to contact office if movement side effects occur.     Diagnoses and all orders for this visit:  Insomnia, unspecified type -     ALPRAZolam (XANAX) 0.25 MG tablet; Take 1 tablet (0.25 mg total) by mouth 3 (three) times daily as needed for anxiety.  Major depressive disorder, recurrent episode, moderate (HCC) -     ARIPiprazole (ABILIFY) 5 MG tablet; Take 1 tablet (5 mg total) by mouth daily. -     DULoxetine (CYMBALTA) 60 MG capsule; Take 1 capsule (60 mg total) by mouth 2 (two) times daily.  Generalized anxiety disorder -     ARIPiprazole (ABILIFY) 5 MG tablet; Take 1 tablet (5 mg total) by mouth daily. -     DULoxetine (CYMBALTA) 60 MG capsule; Take 1 capsule (60 mg total) by mouth 2 (two) times daily.    Please see After Visit Summary for patient specific instructions.  Future Appointments  Date Time Provider Department Center  09/26/2021  8:15 AM Edwin Cap, DPM [facility]  10/04/2021  9:30 AM Pollyann Savoy, MD [facility] None  10/23/2021  9:45 AM Felecia Shelling, DPM [facility]  11/13/2021  8:50 AM [facility] MM 2 [facility]  01/08/2022 10:15 AM Joselyn Arrow, MD [facility]  03/09/2022  9:40 AM Alfonso Shackett, Thereasa Solo, NP [facility] None  07/11/2022  8:45 AM Joselyn Arrow, MD [facility]    No orders of the defined types were placed in this  encounter.   -------------------------------

## 2021-09-11 ENCOUNTER — Encounter: Payer: Self-pay | Admitting: Family Medicine

## 2021-09-12 DIAGNOSIS — M4854XA Collapsed vertebra, not elsewhere classified, thoracic region, initial encounter for fracture: Secondary | ICD-10-CM | POA: Diagnosis not present

## 2021-09-13 ENCOUNTER — Other Ambulatory Visit: Payer: Self-pay | Admitting: Family Medicine

## 2021-09-14 ENCOUNTER — Ambulatory Visit: Payer: Medicare Other | Admitting: Podiatry

## 2021-09-14 DIAGNOSIS — R4701 Aphasia: Secondary | ICD-10-CM | POA: Diagnosis not present

## 2021-09-14 DIAGNOSIS — R278 Other lack of coordination: Secondary | ICD-10-CM | POA: Diagnosis not present

## 2021-09-18 DIAGNOSIS — Z23 Encounter for immunization: Secondary | ICD-10-CM | POA: Diagnosis not present

## 2021-09-20 NOTE — Progress Notes (Deleted)
Office Visit Note  Patient: [name]             Date of Birth: [birth year]           MRN: [id number]             PCP: Rita Ohara, MD Referring: Rita Ohara, MD Visit Date: 10/04/2021 Occupation: @GUAROCC @  Subjective:  No chief complaint on file.   History of Present Illness: [name] is a 65 y.o. female ***   Activities of Daily Living:  Patient reports morning stiffness for *** {minute/hour:19697}.   Patient {ACTIONS;DENIES/REPORTS:21021675::"Denies"} nocturnal pain.  Difficulty dressing/grooming: {ACTIONS;DENIES/REPORTS:21021675::"Denies"} Difficulty climbing stairs: {ACTIONS;DENIES/REPORTS:21021675::"Denies"} Difficulty getting out of chair: {ACTIONS;DENIES/REPORTS:21021675::"Denies"} Difficulty using hands for taps, buttons, cutlery, and/or writing: {ACTIONS;DENIES/REPORTS:21021675::"Denies"}  No Rheumatology ROS completed.   PMFS History:  Patient Active Problem List   Diagnosis Date Noted   Osteopenia 08/04/2021   Acute right-sided weakness 01/22/2021   Peripheral polyneuropathy 01/22/2021   History of migraine headaches 01/22/2021   Difficulty with speech    Gastric bypass status for obesity 09/29/2019   Aftercare 08/12/2018   Pain in left knee 06/03/2018   Dyspnea 12/26/2017   Restrictive lung disease secondary to obesity 12/26/2017   Atypical chest pain 12/06/2017   Sinus tachycardia 12/06/2017   Chest pain 12/06/2017   DJD (degenerative joint disease), cervical 12/24/2016   Primary osteoarthritis of both hips 12/24/2016   Primary osteoarthritis of both knees 12/24/2016   H/O total knee replacement, right 12/24/2016   Spondylosis of lumbar region without myelopathy or radiculopathy 12/24/2016   Lumbosacral spondylosis without myelopathy 12/24/2016   Acute gout 05/30/2016   Gout 05/30/2016   Myalgia 03/29/2016   Other long term (current) drug therapy 03/29/2016   Idiopathic peripheral neuropathy 03/29/2016   Cannot sleep 03/29/2016   Migraine  without aura and responsive to treatment 03/29/2016   Multifocal myoclonus 03/29/2016   Restless leg 03/29/2016   Has a tremor 03/29/2016   History of aspiration pneumonitis 01/25/2016   History of acute bronchitis 01/25/2016   LPRD (laryngopharyngeal reflux disease) 01/25/2016   Imbalance 01/09/2016   Serotonin syndrome 12/22/2015   Essential hypertension 09/10/2015   OA (osteoarthritis) of knee 09/05/2015   Obesity 05/17/2015   OSA (obstructive sleep apnea) 03/16/2013   Iron deficiency 11/06/2012   Thrombocythemia 11/06/2012   Leukocytosis 11/05/2012   Impaired fasting glucose 07/23/2012   Bronchitis 04/03/2012   Kidney stone on left side 03/05/2012   Kidney cysts 03/04/2012   Loose stools 03/04/2012   Urinary frequency 12/04/2011   Restless leg syndrome 12/04/2011   Polypharmacy 12/04/2011   Bladder incontinence 12/04/2011   Fibromyalgia    Mixed hyperlipidemia    Edema    S/P endometrial ablation    Allergic rhinitis 09/18/2011   Depression, major, single episode, in partial remission ([place]) 04/21/2011   PRECORDIAL PAIN 02/17/2010   Anxiety state 01/30/2010   Migraine 01/30/2010   GERD without esophagitis 01/30/2010   Gastroparesis 01/30/2010    Past Medical History:  Diagnosis Date   Anemia    previously followed by Dr. Jamse Arn for anemia and elevated platelets   Anxiety    C. difficile colitis 10/01/2012   treated by [facility]   Chronic fatigue syndrome    Closed wedge compression fracture of T8 vertebra ([place]) 06/2021   DDD (degenerative disc disease), lumbar 08/19/2014   and facet arthroplasty & left lumbar radiculopathy (Dr.Ramos)   Depression    Dyssynergia    dyssynergenic defecation, contributing to fecal incontinence.  Edema    Fibromyalgia    Gastroparesis    followed at [facility]   GERD (gastroesophageal reflux disease)    History of kidney stones    History of vertebral fracture 06/30/2021   Hyperlipidemia    Kidney stone    Lumbar radiculopathy     Migraine    Neuropathy    Obstructive sleep apnea    Does  wear  CPAP   Paresthesia    Dr. Everette Rank at [facility]   Pelvic floor dysfunction    pelvic floor dyssynergy   Plantar fasciitis 02/2011   R foot   Pneumonia    2012   PONV (postoperative nausea and vomiting)    pt states has gastroparesis has difficulty taking antibiotics and narcotics has severe nausea and vomiting    Restless leg syndrome    S/P endometrial ablation 08/09/2006   Novasure Ablation   S/P epidural steroid injection 09/20/2014   Dr.Ramos   Tremor    Dr. Everette Rank   Urinary frequency    Urinary incontinence     Family History  Problem Relation Age of Onset   Allergies Mother    Hypertension Mother    Heart disease Mother        possible valve problem - leaking valve   Macular degeneration Mother    Heart disease Father        pacemaker, CHF   Hypertension Father    Diabetes Father        borderline   Stroke Father 11   Kidney disease Father    Asthma Sister    Irritable bowel syndrome Sister    Allergies Sister    Heart disease Paternal Grandmother    Heart disease Paternal Grandfather    Cancer Maternal Aunt        leukemia   Cancer Maternal Aunt    Colon cancer Maternal Aunt        late 60's   Heart disease Maternal Grandmother    Heart disease Maternal Grandfather    CAD Neg Hx    Past Surgical History:  Procedure Laterality Date   CHOLECYSTECTOMY  9/05   ENDOMETRIAL ABLATION  08/09/2006   Dr. Valentina Shaggy Ablation   FACET JOINT INJECTION  04/17/2017   Left L4-5 and L5-S1   GASTRIC ROUX-EN-Y N/A 09/29/2019   Procedure: LAPAROSCOPIC ROUX-EN-Y GASTRIC BYPASS WITH UPPER ENDOSCOPY, ERAS Pathway;  Surgeon: Johnathan Hausen, MD;  Location: [facility];  Service: General;  Laterality: N/A;   [place]   R knee, Dr. Eddie Dibbles, torn cartilage   RETINAL LASER PROCEDURE Right 08/28/2018   laser retinopexy   RIGHT/LEFT HEART CATH AND CORONARY ANGIOGRAPHY N/A  01/01/2018   Procedure: RIGHT/LEFT HEART CATH AND CORONARY ANGIOGRAPHY;  Surgeon: Sherren Mocha, MD;  Location: [place] [facility];  Service: Cardiovascular;  Laterality: N/A;   TONSILLECTOMY  1968   TONSILLECTOMY     TOTAL KNEE ARTHROPLASTY Right 09/05/2015   Procedure: RIGHT TOTAL KNEE ARTHROPLASTY;  Surgeon: Gaynelle Arabian, MD;  Location: [facility];  Service: Orthopedics;  Laterality: Right;   TOTAL KNEE ARTHROPLASTY Left 07/14/2018   Procedure: LEFT TOTAL KNEE ARTHROPLASTY;  Surgeon: Gaynelle Arabian, MD;  [facility];  Service: Orthopedics;  Laterality: Left;   ULTRASOUND GUIDANCE FOR VASCULAR ACCESS  01/01/2018   Procedure: Ultrasound Guidance For Vascular Access;  Surgeon: Sherren Mocha, MD;  Location: [place] [facility];  Service: Cardiovascular;;   UMBILICAL HERNIA REPAIR N/A 09/29/2019   Procedure:  HERNIA REPAIR UMBILICAL ADULT;  Surgeon: Johnathan Hausen, MD;  [facility]: [facility];  Service: General;  Laterality: N/A;   Social History   Social History Narrative   Married, 1 dog. 1 son in [place] (grandson born 06/2017), 1 stepson in [place], with 2 children   Immunization History  Administered Date(s) Administered   Influenza Inj Mdck Quad Pf 09/13/2019   Influenza Split 09/17/2011, 09/03/2012, 09/04/2013   Influenza,inj,Quad PF,6+ Mos 08/16/2014, 07/27/2015, 07/31/2017, 08/13/2018, 07/28/2020   Influenza-Unspecified 10/04/2016, 07/31/2017, 09/23/2017   PFIZER Comirnaty(Gray Top)Covid-19 Tri-Sucrose Vaccine 06/01/2021   PFIZER(Purple Top)SARS-COV-2 Vaccination 02/20/2020, 03/12/2020, 09/20/2020   Tdap 01/16/2011, 06/20/2017   Zoster Recombinat (Shingrix) 02/05/2019, 07/14/2019     Objective: Vital Signs: LMP 07/27/2006    Physical Exam   Musculoskeletal Exam: ***  CDAI Exam: CDAI Score: -- Patient Global: --; Provider Global: -- Swollen: --; Tender: -- Joint Exam 10/04/2021   No joint exam has been documented for this visit   There is currently no information documented  on the homunculus. Go to the Rheumatology activity and complete the homunculus joint exam.  Investigation: No additional findings.  Imaging: DG Foot Complete Right  Result Date: 08/25/2021 Please see detailed radiograph report in office note.   Recent Labs: Lab Results  Component Value Date   WBC 8.3 08/24/2021   HGB 12.6 08/24/2021   PLT 384 08/24/2021   NA 140 08/24/2021   K 5.0 08/24/2021   CL 98 08/24/2021   CO2 28 08/24/2021   GLUCOSE 95 08/24/2021   BUN 20 08/24/2021   CREATININE 0.97 08/24/2021   BILITOT 0.2 06/28/2021   ALKPHOS 138 (H) 06/28/2021   AST 30 06/28/2021   ALT 30 06/28/2021   PROT 7.1 07/05/2021   ALBUMIN 3.9 06/28/2021   CALCIUM 8.9 08/24/2021   GFRAA 81 11/07/2020    Speciality Comments: Forteo started 08/07/21 (first dose in clinic)  Procedures:  No procedures performed Allergies: Erythromycin, Tramadol, and Dilaudid [hydromorphone hcl]   Assessment / Plan:     Visit Diagnoses: No diagnosis found.  Orders: No orders of the defined types were placed in this encounter.  No orders of the defined types were placed in this encounter.   Face-to-face time spent with patient was *** minutes. Greater than 50% of time was spent in counseling and coordination of care.  Follow-Up Instructions: No follow-ups on file.   Earnestine Mealing, CMA  Note - This record has been created using Editor, commissioning.  Chart creation errors have been sought, but may not always  have been located. Such creation errors do not reflect on  the standard of medical care.

## 2021-09-25 ENCOUNTER — Other Ambulatory Visit: Payer: Self-pay

## 2021-09-25 ENCOUNTER — Ambulatory Visit (HOSPITAL_BASED_OUTPATIENT_CLINIC_OR_DEPARTMENT_OTHER): Payer: Medicare Other | Attending: Orthopedic Surgery | Admitting: Physical Therapy

## 2021-09-25 ENCOUNTER — Encounter (HOSPITAL_BASED_OUTPATIENT_CLINIC_OR_DEPARTMENT_OTHER): Payer: Self-pay | Admitting: Physical Therapy

## 2021-09-25 DIAGNOSIS — M546 Pain in thoracic spine: Secondary | ICD-10-CM | POA: Diagnosis not present

## 2021-09-25 DIAGNOSIS — R262 Difficulty in walking, not elsewhere classified: Secondary | ICD-10-CM

## 2021-09-25 DIAGNOSIS — M25611 Stiffness of right shoulder, not elsewhere classified: Secondary | ICD-10-CM | POA: Diagnosis not present

## 2021-09-25 DIAGNOSIS — M25612 Stiffness of left shoulder, not elsewhere classified: Secondary | ICD-10-CM | POA: Diagnosis not present

## 2021-09-25 NOTE — Therapy (Signed)
OUTPATIENT PHYSICAL THERAPY THORACOLUMBAR EVALUATION   Patient Name: [name] MRN: [id number] DOB:08-20-1956, 65 y.o., female Today's Date: 09/25/2021   PT End of Session - 09/25/21 1352     Visit Number 1    Number of Visits 12    Date for PT Re-Evaluation 11/06/21    Authorization Type [employer] Medicare    PT Start Time 0935    PT Stop Time 1030    PT Time Calculation (min) 55 min    Activity Tolerance Patient tolerated treatment well    Behavior During Therapy WFL for tasks assessed/performed             Past Medical History:  Diagnosis Date   Anemia    previously followed by Dr. Jamse Arn for anemia and elevated platelets   Anxiety    C. difficile colitis 10/01/2012   treated by [facility]   Chronic fatigue syndrome    Closed wedge compression fracture of T8 vertebra ([place]) 06/2021   DDD (degenerative disc disease), lumbar 08/19/2014   and facet arthroplasty & left lumbar radiculopathy (Dr.Ramos)   Depression    Dyssynergia    dyssynergenic defecation, contributing to fecal incontinence.   Edema    Fibromyalgia    Gastroparesis    followed at [facility]   GERD (gastroesophageal reflux disease)    History of kidney stones    History of vertebral fracture 06/30/2021   Hyperlipidemia    Kidney stone    Lumbar radiculopathy    Migraine    Neuropathy    Obstructive sleep apnea    Does  wear  CPAP   Paresthesia    Dr. Everette Rank at [facility]   Pelvic floor dysfunction    pelvic floor dyssynergy   Plantar fasciitis 02/2011   R foot   Pneumonia    2012   PONV (postoperative nausea and vomiting)    pt states has gastroparesis has difficulty taking antibiotics and narcotics has severe nausea and vomiting    Restless leg syndrome    S/P endometrial ablation 08/09/2006   Novasure Ablation   S/P epidural steroid injection 09/20/2014   Dr.Ramos   Tremor    Dr. Everette Rank   Urinary frequency    Urinary incontinence    Past Surgical History:  Procedure Laterality  Date   CHOLECYSTECTOMY  9/05   ENDOMETRIAL ABLATION  08/09/2006   Dr. Valentina Shaggy Ablation   FACET JOINT INJECTION  04/17/2017   Left L4-5 and L5-S1   GASTRIC ROUX-EN-Y N/A 09/29/2019   Procedure: LAPAROSCOPIC ROUX-EN-Y GASTRIC BYPASS WITH UPPER ENDOSCOPY, ERAS Pathway;  Surgeon: Johnathan Hausen, MD;  [facility];  Service: General;  Laterality: N/A;   [place]   R knee, Dr. Eddie Dibbles, torn cartilage   RETINAL LASER PROCEDURE Right 08/28/2018   laser retinopexy   RIGHT/LEFT HEART CATH AND CORONARY ANGIOGRAPHY N/A 01/01/2018   Procedure: RIGHT/LEFT HEART CATH AND CORONARY ANGIOGRAPHY;  Surgeon: Sherren Mocha, MD;  Location: [place] [facility];  Service: Cardiovascular;  Laterality: N/A;   TONSILLECTOMY  1968   TONSILLECTOMY     TOTAL KNEE ARTHROPLASTY Right 09/05/2015   Procedure: RIGHT TOTAL KNEE ARTHROPLASTY;  Surgeon: Gaynelle Arabian, MD;  [facility];  Service: Orthopedics;  Laterality: Right;   TOTAL KNEE ARTHROPLASTY Left 07/14/2018   Procedure: LEFT TOTAL KNEE ARTHROPLASTY;  Surgeon: Gaynelle Arabian, MD;  [facility];  Service: Orthopedics;  Laterality: Left;   ULTRASOUND GUIDANCE FOR VASCULAR ACCESS  01/01/2018  Procedure: Ultrasound Guidance For Vascular Access;  Surgeon: Sherren Mocha, MD;  Location: [place] [facility];  Service: Cardiovascular;;   UMBILICAL HERNIA REPAIR N/A 09/29/2019   Procedure: HERNIA REPAIR UMBILICAL ADULT;  Surgeon: Johnathan Hausen, MD;  [facility];  Service: General;  Laterality: N/A;   Patient Active Problem List   Diagnosis Date Noted   Osteopenia 08/04/2021   Acute right-sided weakness 01/22/2021   Peripheral polyneuropathy 01/22/2021   History of migraine headaches 01/22/2021   Difficulty with speech    Gastric bypass status for obesity 09/29/2019   Aftercare 08/12/2018   Pain in left knee 06/03/2018   Dyspnea 12/26/2017   Restrictive lung disease secondary to obesity 12/26/2017   Atypical  chest pain 12/06/2017   Sinus tachycardia 12/06/2017   Chest pain 12/06/2017   DJD (degenerative joint disease), cervical 12/24/2016   Primary osteoarthritis of both hips 12/24/2016   Primary osteoarthritis of both knees 12/24/2016   H/O total knee replacement, right 12/24/2016   Spondylosis of lumbar region without myelopathy or radiculopathy 12/24/2016   Lumbosacral spondylosis without myelopathy 12/24/2016   Acute gout 05/30/2016   Gout 05/30/2016   Myalgia 03/29/2016   Other long term (current) drug therapy 03/29/2016   Idiopathic peripheral neuropathy 03/29/2016   Cannot sleep 03/29/2016   Migraine without aura and responsive to treatment 03/29/2016   Multifocal myoclonus 03/29/2016   Restless leg 03/29/2016   Has a tremor 03/29/2016   History of aspiration pneumonitis 01/25/2016   History of acute bronchitis 01/25/2016   LPRD (laryngopharyngeal reflux disease) 01/25/2016   Imbalance 01/09/2016   Serotonin syndrome 12/22/2015   Essential hypertension 09/10/2015   OA (osteoarthritis) of knee 09/05/2015   Obesity 05/17/2015   OSA (obstructive sleep apnea) 03/16/2013   Iron deficiency 11/06/2012   Thrombocythemia 11/06/2012   Leukocytosis 11/05/2012   Impaired fasting glucose 07/23/2012   Bronchitis 04/03/2012   Kidney stone on left side 03/05/2012   Kidney cysts 03/04/2012   Loose stools 03/04/2012   Urinary frequency 12/04/2011   Restless leg syndrome 12/04/2011   Polypharmacy 12/04/2011   Bladder incontinence 12/04/2011   Fibromyalgia    Mixed hyperlipidemia    Edema    S/P endometrial ablation    Allergic rhinitis 09/18/2011   Depression, major, single episode, in partial remission ([place]) 04/21/2011   PRECORDIAL PAIN 02/17/2010   Anxiety state 01/30/2010   Migraine 01/30/2010   GERD without esophagitis 01/30/2010   Gastroparesis 01/30/2010    PCP: Rita Ohara, MD  REFERRING PROVIDER: Melina Schools, MD  REFERRING DIAG: [phone] Collapsed vertebra, thoracic  region.  (Compression Fx of Thoracic Vertebra)  THERAPY DIAG:  Pain in thoracic spine Joint stiffness in thoracic  Difficulty in walking Joint stiffness in Right shoulder Joint stiffness in Left shoulder  ONSET DATE: End of July 2022  SUBJECTIVE:  SUBJECTIVE STATEMENT: -She stood up and lost her balance falling into the wall.  Pt reports she slid down the wall some but did not fall.  Pt reports she has pain 2-3 days later.  Pt went to her PCP and then the orthopedic.  She wore a back brace for 2 months.  Pt has a hx of compression fractures last year after a fall.  She wore a back brace which helped.  Pt states the back brace didn't help as much this time and she is not improving as quickly as she did last year.  Pt has been seeing Dr. Tonita Cong who informed her she is not healing as quickly as she should.  He referred her to Dr. Rolena Infante concerning possible surgery.  Pt is unable to take certain meds and she states Dr. Rolena Infante recommended she may need surgery.  He ordered aquatic PT.  Pt is planning to see a neurosurgeon to get another opinion.     -Pt has increased pain getting OOB including getting up from a lying position which is her worst pain.  Pain with  standing and sitting.  Pt is limited with ambulation distance.  She is limited with and has pain with performing household chores.  Pt was instructed by MD to not vacuum and to not bend over and pick up an object.  Pt is unable to perform her normal gym exercise routine.  She is unable to lift objects and bend to put away pots/pans.    PERTINENT HISTORY:  Hx of compression Fractures (T12 and L1), osteoporosis, fibromyalgia, obesity, Peripharal neuropathy, Bilat TKA, tachycardia, cervical and lumbar spondylosis, and gastric bypass surgery.  PAIN:  Are you having  pain? Yes NPRS scale: 5/10 current, 10/10 worst, 2-3/10 best Pain location: mid thoracic PAIN TYPE: aching and sharp Pain description: constant  Aggravating factors: standing, sitting Relieving factors: medication, hot showers occasionally  PRECAUTIONS: Other: Hx of compression Fractures and osteoporosis  WEIGHT BEARING RESTRICTIONS No  FALLS:  Has patient fallen in last 6 months? Yes, Number of falls: 3  LIVING ENVIRONMENT: Lives with: lives with their spouse Lives in: House/apartment Stairs: Yes; 2 story home Has following equipment at home: cane, walker  OCCUPATION: Pt is retired   PLOF: Independent.  Pt was able to perform her normal standing and sitting activities without significant pain.  Pt was able to ambulate further distance without significant pain.  Pt was going to a class at the gym 3x/wk including balance, strengthening and aerobic activities.  Pt was using equipment in the gym.   PATIENT GOALS return to PLOF.  To be able to perform household chores.  Return to gym activities.    OBJECTIVE:   DIAGNOSTIC FINDINGS:  -Pt had x rays on 07/04/21 which showed: (In Epic) 1. Mild to moderate compression fractures of T8, T12, and L1. All are new since 08/28/2018, but the T8 level might be most recent. 2. No other acute osseous abnormality identified in the thoracic spine. -(Per MD notes) T8 Compression Fx; approx 30% loss of anterior height.  T12 and L1 compression deformities are unchanged and appear to be healed.  Thoracic MRI on 07/14/21 indicated T8 acute/subacute 9 anterior compression deformoity of T8. Subacute benign T9 minimal ant inf end plate compression.  Chronic T12 and L1 compression deformities.    PATIENT SURVEYS:  FOTO [id number]    COGNITION:  Overall cognitive status: Within functional limits for tasks assessed     PALPATION:  Tenderness to palpate thoracic paraspinals on R  T6-T7 and SP's of T7-T8       POSTURE:  R lean  THORACIC AROM  AROM AROM   09/25/2021  Flexion 50% minimal pain  Extension Gently past neutral  Right lateral flexion 25% with min pain  Left lateral flexion 60% with pain  Right rotation 15% with pain  Left rotation 15%   (Blank rows = not tested)   Shoulder AROM: Flexion:  R/L:  120 deg/122 deg  [place]:  R/L:  118/ 120 deg   GAIT: Comments: decreased pelvic rotation bilat, increased stance time on L, decreased control with foot contact on R with slight limp, slow gait    TODAY'S TREATMENT  See below for pt education.   PATIENT EDUCATION:  Education details: Educated pt in dx and relevant spinal anatomy.  Used a spinal model to educate pt.  Instructed pt in importance of not slumping over and not bending forward.  Instructed pt to in having a good erect posture including shoulders retracted and neutral spine.  Instructed pt to avoid lifting.  Educated pt in aquatic rehab process benefits of aquatic PT, and POC.  Answered Pt's questions.  Person educated: Patient Education method: Explanation, spinal model, demonstration Education comprehension: verbalized understanding   HOME EXERCISE PROGRAM: Will give at next land visit.   ASSESSMENT:  CLINICAL IMPRESSION: Patient is a 65 y.o. female 3 months s/p thoracic compression fracture with a hx of 2 other compression fractures.  MD is concerned she is not healing as fast as she should.  She presents to the clinic with thoracic pain, limited thoracic ROM, limited shoulder elevation AROM bilat, and difficulty in walking.  She has pain and is limited with sitting and standing activities. Pt has increased pain with daily mobility including getting OOB and is limited with ambulation distance.  She is limited with and has pain with performing household chores, lifting, and is unable to perform her normal gym exercise routine.  Objective impairments include Abnormal gait, decreased activity tolerance, decreased endurance, decreased mobility, difficulty walking,  decreased ROM, decreased strength, hypomobility, impaired flexibility, impaired UE functional use, postural dysfunction, and pain. These impairments are limiting patient from cleaning, community activity, meal prep, laundry, shopping, and ambulation . Personal factors including 3+ comorbidities: Hx of compression Fractures (T12 and L1), osteoporosis, fibromyalgia, obesity, Peripharal neuropathy, Bilat TKA, and cervical and lumbar spondylosis.  are also affecting patient's functional outcome. Patient should benefit from skilled PT to address above impairments and improve overall function.  REHAB POTENTIAL: Good  CLINICAL DECISION MAKING: Evolving/moderate complexity  EVALUATION COMPLEXITY: Moderate   GOALS:   SHORT TERM GOALS:  STG Name Target Date Goal status  1 Pt will tolerate aquatic therapy without adverse effects for improved tolerance to activity and mobility.  Baseline:  10/09/2021 INITIAL  2 Pt will demo improved shoulder flexion and scaption AROM by at least 10-15 deg bilat for improved reaching and IADLs.  Baseline:  10/16/2021 INITIAL  3 Pt will report her worst pain to be less than 8/10 for improved bed mobility and getting OOB. Baseline: 10/16/2021 INITIAL                       LONG TERM GOALS:   LTG Name Target Date Goal status  1 Pt will be able to perform her normal ambulation distance without significant pain or limitation.  Baseline: 11/06/2021 INITIAL  2 Pt will report improved tolerance with standing activities including to be able to perform her normal household chores without  significant pain with exceptions of lifting.  Baseline: 11/06/2021 INITIAL  3 Pt will report at least a 65-70% improvement in her daily mobility.  Baseline: 11/06/2021 INITIAL  4 Pt will demo improved quality of gait including increased pelvic rotation, reduced limp, = stance time, and increased speed. Baseline: 10/23/2021 INITIAL   PLAN: PT FREQUENCY: 2x/week  PT DURATION: other:  4-6 weeks  PLANNED INTERVENTIONS: Therapeutic exercises, Therapeutic activity, Neuro Muscular re-education, Balance training, Gait training, Patient/Family education, Stair training, Aquatic Therapy, Cryotherapy, Moist heat, Taping, Ultrasound, and Manual therapy  PLAN FOR NEXT SESSION: Cont with aquatic therapy.  2 aquatic visits then 1 land visit.   Selinda Michaels III PT, DPT 09/25/21 11:40 PM

## 2021-09-26 ENCOUNTER — Ambulatory Visit: Payer: Medicare Other | Admitting: Podiatry

## 2021-09-26 DIAGNOSIS — M7989 Other specified soft tissue disorders: Secondary | ICD-10-CM | POA: Diagnosis not present

## 2021-09-26 DIAGNOSIS — M7661 Achilles tendinitis, right leg: Secondary | ICD-10-CM | POA: Diagnosis not present

## 2021-09-26 DIAGNOSIS — M109 Gout, unspecified: Secondary | ICD-10-CM

## 2021-09-26 NOTE — Progress Notes (Signed)
  Subjective:  Patient ID: [name], female    DOB: 05-May-1956,  MRN: [id number]  Chief Complaint  Patient presents with   Arthritis    1 month follow up for right toe arthritis and gout .    65 y.o. female presents with the above complaint. History confirmed with patient.  Toe is still been swollen but the pain is improved she improved after the colchicine.  She has a new issue of posterior right heel pain with pressure from shoe gear  Objective:  Physical Exam: warm, good capillary refill, no trophic changes or ulcerative lesions, normal DP and PT pulses, and normal sensory exam.  Right Foot: Right hallux IPJ swollen minimally tender there is arthritic feeling with range of motion of the joint which causes some discomfort, she has pain on the posterior heel at the insertion of the Achilles tendon  No images are attached to the encounter.  Radiographs: Multiple views x-ray of the left foot: Severe osteoarthrosis with subchondral cystic changes of the IPJ Assessment:   1. Swelling of toe of right foot   2. Gouty arthritis of toe of right foot   3. Achilles tendinitis, right leg      Plan:  Patient was evaluated and treated and all questions answered.  Has improved with colchicine, has residual arthritis of the IPJ.  I suspect she will likely need arthrodesis of the IP joint which we discussed.  We discussed the procedure itself as well as the postoperative course.  She would like to wait until after the holidays to plan for this and will come back to see me later this winter to plan for surgery in late winter or early spring.  Discussed the etiology and treatment options for Achilles tendinitis including stretching, formal physical therapy with an eccentric exercises therapy plan, supportive shoegears such as a running shoe or sneaker, heel lifts, topical and oral medications.  We also discussed that I do not routinely perform injections in this area because of the risk of an  increased damage or rupture of the tendon.  We also discussed the role of surgical treatment of this for patients who do not improve after exhausting non-surgical treatment options.  -Educated on stretching and icing of the affected limb. -Recommend OTC Aleve twice daily, she has some gastric reflux previously so we will hold off on any prescription current medications for now -Recommend Voltaren gel 3-4 times daily -Home therapy exercise plan given to her -Tuli's heel cups recommended -If not improving in about 1 month would want to plan for boot and PT   Return if symptoms worsen or fail to improve, for when ready to schedule surgery or if Achilles does not improve .

## 2021-09-26 NOTE — Patient Instructions (Addendum)

## 2021-09-28 ENCOUNTER — Ambulatory Visit (HOSPITAL_BASED_OUTPATIENT_CLINIC_OR_DEPARTMENT_OTHER): Payer: Medicare Other | Attending: Orthopedic Surgery | Admitting: Physical Therapy

## 2021-09-28 ENCOUNTER — Other Ambulatory Visit: Payer: Self-pay

## 2021-09-28 DIAGNOSIS — R2681 Unsteadiness on feet: Secondary | ICD-10-CM | POA: Diagnosis not present

## 2021-09-28 DIAGNOSIS — M25612 Stiffness of left shoulder, not elsewhere classified: Secondary | ICD-10-CM | POA: Diagnosis not present

## 2021-09-28 DIAGNOSIS — M546 Pain in thoracic spine: Secondary | ICD-10-CM | POA: Insufficient documentation

## 2021-09-28 DIAGNOSIS — M25561 Pain in right knee: Secondary | ICD-10-CM | POA: Diagnosis not present

## 2021-09-28 DIAGNOSIS — R262 Difficulty in walking, not elsewhere classified: Secondary | ICD-10-CM

## 2021-09-28 DIAGNOSIS — M6281 Muscle weakness (generalized): Secondary | ICD-10-CM | POA: Diagnosis not present

## 2021-09-28 DIAGNOSIS — G8929 Other chronic pain: Secondary | ICD-10-CM | POA: Diagnosis not present

## 2021-09-28 DIAGNOSIS — M25611 Stiffness of right shoulder, not elsewhere classified: Secondary | ICD-10-CM | POA: Insufficient documentation

## 2021-09-28 DIAGNOSIS — R2689 Other abnormalities of gait and mobility: Secondary | ICD-10-CM

## 2021-09-28 DIAGNOSIS — R293 Abnormal posture: Secondary | ICD-10-CM

## 2021-09-28 NOTE — Therapy (Signed)
OUTPATIENT PHYSICAL THERAPY TREATMENT NOTE   Patient Name: [name] MRN: [id number] DOB:16-May-1956, 65 y.o., female Today's Date: 09/28/2021  PCP: Joselyn Arrow, MD REFERRING PROVIDER: Joselyn Arrow, MD   PT End of Session - 09/28/21 0930     Visit Number 2    Number of Visits 12    Date for PT Re-Evaluation 11/06/21    Authorization Type [employer] Medicare    PT Start Time 0905    PT Stop Time 1000    PT Time Calculation (min) 55 min    Activity Tolerance Patient tolerated treatment well    Behavior During Therapy [facility] for tasks assessed/performed             Past Medical History:  Diagnosis Date   Anemia    previously followed by Dr. Dalene Carrow for anemia and elevated platelets   Anxiety    C. difficile colitis 10/01/2012   treated by [facility]   Chronic fatigue syndrome    Closed wedge compression fracture of T8 vertebra (HCC) 06/2021   DDD (degenerative disc disease), lumbar 08/19/2014   and facet arthroplasty & left lumbar radiculopathy (Dr.Ramos)   Depression    Dyssynergia    dyssynergenic defecation, contributing to fecal incontinence.   Edema    Fibromyalgia    Gastroparesis    followed at [facility]   GERD (gastroesophageal reflux disease)    History of kidney stones    History of vertebral fracture 06/30/2021   Hyperlipidemia    Kidney stone    Lumbar radiculopathy    Migraine    Neuropathy    Obstructive sleep apnea    Does  wear  CPAP   Paresthesia    Dr. Antonietta Barcelona at [facility]   Pelvic floor dysfunction    pelvic floor dyssynergy   Plantar fasciitis 02/2011   R foot   Pneumonia    2012   PONV (postoperative nausea and vomiting)    pt states has gastroparesis has difficulty taking antibiotics and narcotics has severe nausea and vomiting    Restless leg syndrome    S/P endometrial ablation 08/09/2006   Novasure Ablation   S/P epidural steroid injection 09/20/2014   Dr.Ramos   Tremor    Dr. Antonietta Barcelona   Urinary frequency    Urinary incontinence     Past Surgical History:  Procedure Laterality Date   CHOLECYSTECTOMY  9/05   ENDOMETRIAL ABLATION  08/09/2006   Dr. Kingsley Plan Ablation   FACET JOINT INJECTION  04/17/2017   Left L4-5 and L5-S1   GASTRIC ROUX-EN-Y N/A 09/29/2019   Procedure: LAPAROSCOPIC ROUX-EN-Y GASTRIC BYPASS WITH UPPER ENDOSCOPY, ERAS Pathway;  Surgeon: Luretha Murphy, MD;  [facility];  Service: General;  Laterality: N/A;   KNEE ARTHROPLASTY     KNEE SURGERY  1999   R knee, Dr. Renae Fickle, torn cartilage   RETINAL LASER PROCEDURE Right 08/28/2018   laser retinopexy   RIGHT/LEFT HEART CATH AND CORONARY ANGIOGRAPHY N/A 01/01/2018   Procedure: RIGHT/LEFT HEART CATH AND CORONARY ANGIOGRAPHY;  Surgeon: Tonny Bollman, MD;  Location: [facility] INVASIVE CV LAB;  Service: Cardiovascular;  Laterality: N/A;   TONSILLECTOMY  1968   TONSILLECTOMY     TOTAL KNEE ARTHROPLASTY Right 09/05/2015   Procedure: RIGHT TOTAL KNEE ARTHROPLASTY;  Surgeon: Ollen Gross, MD;  [facility];  Service: Orthopedics;  Laterality: Right;   TOTAL KNEE ARTHROPLASTY Left 07/14/2018   Procedure: LEFT TOTAL KNEE ARTHROPLASTY;  Surgeon: Ollen Gross, MD;  [facility];  Service: Orthopedics;  Laterality: Left;  ULTRASOUND GUIDANCE FOR VASCULAR ACCESS  01/01/2018   Procedure: Ultrasound Guidance For Vascular Access;  Surgeon: Tonny Bollman, MD;  Location: [facility] INVASIVE CV LAB;  Service: Cardiovascular;;   UMBILICAL HERNIA REPAIR N/A 09/29/2019   Procedure: HERNIA REPAIR UMBILICAL ADULT;  Surgeon: Luretha Murphy, MD;  [facility];  Service: General;  Laterality: N/A;   Patient Active Problem List   Diagnosis Date Noted   Osteopenia 08/04/2021   Acute right-sided weakness 01/22/2021   Peripheral polyneuropathy 01/22/2021   History of migraine headaches 01/22/2021   Difficulty with speech    Gastric bypass status for obesity 09/29/2019   Aftercare 08/12/2018   Pain in left knee 06/03/2018   Dyspnea 12/26/2017   Restrictive lung  disease secondary to obesity 12/26/2017   Atypical chest pain 12/06/2017   Sinus tachycardia 12/06/2017   Chest pain 12/06/2017   DJD (degenerative joint disease), cervical 12/24/2016   Primary osteoarthritis of both hips 12/24/2016   Primary osteoarthritis of both knees 12/24/2016   H/O total knee replacement, right 12/24/2016   Spondylosis of lumbar region without myelopathy or radiculopathy 12/24/2016   Lumbosacral spondylosis without myelopathy 12/24/2016   Acute gout 05/30/2016   Gout 05/30/2016   Myalgia 03/29/2016   Other long term (current) drug therapy 03/29/2016   Idiopathic peripheral neuropathy 03/29/2016   Cannot sleep 03/29/2016   Migraine without aura and responsive to treatment 03/29/2016   Multifocal myoclonus 03/29/2016   Restless leg 03/29/2016   Has a tremor 03/29/2016   History of aspiration pneumonitis 01/25/2016   History of acute bronchitis 01/25/2016   LPRD (laryngopharyngeal reflux disease) 01/25/2016   Imbalance 01/09/2016   Serotonin syndrome 12/22/2015   Essential hypertension 09/10/2015   OA (osteoarthritis) of knee 09/05/2015   Obesity 05/17/2015   OSA (obstructive sleep apnea) 03/16/2013   Iron deficiency 11/06/2012   Thrombocythemia 11/06/2012   Leukocytosis 11/05/2012   Impaired fasting glucose 07/23/2012   Bronchitis 04/03/2012   Kidney stone on left side 03/05/2012   Kidney cysts 03/04/2012   Loose stools 03/04/2012   Urinary frequency 12/04/2011   Restless leg syndrome 12/04/2011   Polypharmacy 12/04/2011   Bladder incontinence 12/04/2011   Fibromyalgia    Mixed hyperlipidemia    Edema    S/P endometrial ablation    Allergic rhinitis 09/18/2011   Depression, major, single episode, in partial remission (HCC) 04/21/2011   PRECORDIAL PAIN 02/17/2010   Anxiety state 01/30/2010   Migraine 01/30/2010   GERD without esophagitis 01/30/2010   Gastroparesis 01/30/2010    REFERRING DIAG: M48.54xA Collapsed vertebra, thoracic region.   (Compression Fx of Thoracic Vertebra)  THERAPY DIAG:  Abnormal posture  Chronic pain of right knee  Difficulty in walking, not elsewhere classified  Muscle weakness (generalized)  Other abnormalities of gait and mobility  Unsteadiness on feet  P  PRECAUTIONS: Other: Hx of compression Fractures and osteoporosis  SUBJECTIVE: I see an neurosurgeon on Monday for a second opinion on the orthopedics suggestion of a kyphoplasty"  PAIN:  Are you having pain? Yes VAS scale: 5/10 09/28/21 Pain location: T8 area with some radiation anteriorly Pain orientation: Right  PAIN TYPE: aching Pain description: constant  Aggravating factors: standing Relieving factors: lyingdown/oxycodone    OBJECTIVE:  PERTINENT HISTORY:  Hx of compression Fractures (T12 and L1), osteoporosis, fibromyalgia, obesity, Peripharal neuropathy, Bilat TKA, tachycardia, cervical and lumbar spondylosis, and gastric bypass surgery.   PAIN:  Are you having pain? Yes NPRS scale: 5/10 current, 10/10 worst, 2-3/10 best Pain location: mid thoracic PAIN TYPE:  aching and sharp Pain description: constant  Aggravating factors: standing, sitting Relieving factors: medication, hot showers occasionally     PATIENT EDUCATION:  Education details: Educated pt in dx and relevant spinal anatomy.  Used a spinal model to educate pt.  Instructed pt in importance of not slumping over and not bending forward.  Instructed pt to in having a good erect posture including shoulders retracted and neutral spine.  Instructed pt to avoid lifting.  Educated pt in aquatic rehab process benefits of aquatic PT, and POC.  Answered Pt's questions.   Person educated: Patient Education method: Explanation, spinal model, demonstration Education comprehension: verbalized understanding  HOME EXERCISE PROGRAM: Shoulder shrugging and shoulder retraction   ASSESSMENT:   CLINICAL IMPRESSION: Pt introduced to aquatic setting.  Demonstrates  confidence and indep with maneuvering submerged t all levels. She is directed through exercises using all musculature attached to T8 to increase circulation and encourage bone reabsorption for healing of fracture. Pt completes all without pain but does report "feeling some" discomfort with resisted lateral rotation to the left and with hip hinges at end range.  She is encouraged to work just shy of discomfort.  She is having a second opinion on Monday to determine if and when kyphoplasty procedure.  She also has questions regarding orthopedist vs neurosurgeon to complete.   Objective impairments include Abnormal gait, decreased activity tolerance, decreased endurance, decreased mobility, difficulty walking, decreased ROM, decreased strength, hypomobility, impaired flexibility, impaired UE functional use, postural dysfunction, and pain. These impairments are limiting patient from cleaning, community activity, meal prep, laundry, shopping, and ambulation . Personal factors including 3+ comorbidities: Hx of compression Fractures (T12 and L1), osteoporosis, fibromyalgia, obesity, Peripharal neuropathy, Bilat TKA, and cervical and lumbar spondylosis.  are also affecting patient's functional outcome. Patient should benefit from skilled PT to address above impairments and improve overall function.  GOALS:     SHORT TERM GOALS:   STG Name Target Date Goal status  1 Pt will tolerate aquatic therapy without adverse effects for improved tolerance to activity and mobility.  Baseline:  10/09/2021 INITIAL  2 Pt will demo improved shoulder flexion and scaption AROM by at least 10-15 deg bilat for improved reaching and IADLs.  Baseline:  10/16/2021 INITIAL  3 Pt will report her worst pain to be less than 8/10 for improved bed mobility and getting OOB. Baseline: 10/16/2021 INITIAL                                        LONG TERM GOALS:    LTG Name Target Date Goal status  1 Pt will be able to perform her  normal ambulation distance without significant pain or limitation.  Baseline: 11/06/2021 INITIAL  2 Pt will report improved tolerance with standing activities including to be able to perform her normal household chores without significant pain with exceptions of lifting.  Baseline: 11/06/2021 INITIAL  3 Pt will report at least a 65-70% improvement in her daily mobility.  Baseline: 11/06/2021 INITIAL  4 Pt will demo improved quality of gait including increased pelvic rotation, reduced limp, = stance time, and increased speed. Baseline: 10/23/2021 INITIAL    PLAN: PT FREQUENCY: 2x/week   PT DURATION: other: 4-6 weeks   PLANNED INTERVENTIONS: Therapeutic exercises, Therapeutic activity, Neuro Muscular re-education, Balance training, Gait training, Patient/Family education, Stair training, Aquatic Therapy, Cryotherapy, Moist heat, Taping, Ultrasound, and Manual therapy  TODAY'S TREATMENT: 11/3 Pt seen for aquatic therapy today.  Treatment took place in water 3.25-4.8 ft in depth at the [facility] pool. Temp of water was 91.  Pt entered/exited the pool via stairs step to pattern independently with bilat rail.  Pt entered water for aquatic therapy for first time and was introduced to principles and therapeutic effects of water as she ambulated and acclimated to pool. Pt ambulates forward across pool , then backwards to engage posterior chain and then sidestepping   Standing  Gentle stretching of rhomboids/all core submerged 85% (above xiphoid for benefit of buoyancy to decrease pain) Strengthening -using 1 foam buoy: horizontal abd/add 2x10 foam on surface. 2x10 with foam buoys submerged 1-2 inches for added resistance. -shoulder flex x10  -abd/add x10 -shoulder retraction 2 x 10 -shoulder shrugging with hand bouys submerged at waist 2x10 -hip hinges 2x10 -kickboard push downs 3 x 10 -kick board resisted lateral rotations x 10  Pt requires buoyancy for support and to  offload joints with strengthening exercises. Viscosity of the water is needed for resistance of strengthening; water current perturbations provides challenge to standing balance unsupported, requiring increased core activation.      PATIENT EDUCATION: properties of water; benefits of aquatic therapy  PLAN FOR NEXT SESSION: muscle strengthening involving T8 musculature/balance.  Pain after last treatment. Ransom Nickson (Frankie) Haydyn Girvan MPT 09/28/2021, 10:12 AM

## 2021-10-02 ENCOUNTER — Other Ambulatory Visit: Payer: Self-pay

## 2021-10-02 ENCOUNTER — Ambulatory Visit (HOSPITAL_BASED_OUTPATIENT_CLINIC_OR_DEPARTMENT_OTHER): Payer: Medicare Other | Admitting: Physical Therapy

## 2021-10-02 ENCOUNTER — Encounter (HOSPITAL_BASED_OUTPATIENT_CLINIC_OR_DEPARTMENT_OTHER): Payer: Self-pay | Admitting: Physical Therapy

## 2021-10-02 DIAGNOSIS — R262 Difficulty in walking, not elsewhere classified: Secondary | ICD-10-CM | POA: Diagnosis not present

## 2021-10-02 DIAGNOSIS — R293 Abnormal posture: Secondary | ICD-10-CM

## 2021-10-02 DIAGNOSIS — M25612 Stiffness of left shoulder, not elsewhere classified: Secondary | ICD-10-CM | POA: Diagnosis not present

## 2021-10-02 DIAGNOSIS — R2689 Other abnormalities of gait and mobility: Secondary | ICD-10-CM

## 2021-10-02 DIAGNOSIS — M25611 Stiffness of right shoulder, not elsewhere classified: Secondary | ICD-10-CM | POA: Diagnosis not present

## 2021-10-02 DIAGNOSIS — G8929 Other chronic pain: Secondary | ICD-10-CM | POA: Diagnosis not present

## 2021-10-02 DIAGNOSIS — M6281 Muscle weakness (generalized): Secondary | ICD-10-CM | POA: Diagnosis not present

## 2021-10-02 DIAGNOSIS — R2681 Unsteadiness on feet: Secondary | ICD-10-CM

## 2021-10-02 DIAGNOSIS — M25561 Pain in right knee: Secondary | ICD-10-CM | POA: Diagnosis not present

## 2021-10-02 DIAGNOSIS — M546 Pain in thoracic spine: Secondary | ICD-10-CM | POA: Diagnosis not present

## 2021-10-02 NOTE — Progress Notes (Deleted)
Office Visit Note  Patient: [name]             Date of Birth: [birth year]           MRN: [id number]             PCP: Rita Ohara, MD Referring: Rita Ohara, MD Visit Date: 10/12/2021 Occupation: @GUAROCC @  Subjective:  No chief complaint on file.   History of Present Illness: [name] is a 65 y.o. female ***   Activities of Daily Living:  Patient reports morning stiffness for *** {minute/hour:19697}.   Patient {ACTIONS;DENIES/REPORTS:21021675::"Denies"} nocturnal pain.  Difficulty dressing/grooming: {ACTIONS;DENIES/REPORTS:21021675::"Denies"} Difficulty climbing stairs: {ACTIONS;DENIES/REPORTS:21021675::"Denies"} Difficulty getting out of chair: {ACTIONS;DENIES/REPORTS:21021675::"Denies"} Difficulty using hands for taps, buttons, cutlery, and/or writing: {ACTIONS;DENIES/REPORTS:21021675::"Denies"}  No Rheumatology ROS completed.   PMFS History:  Patient Active Problem List   Diagnosis Date Noted   Osteopenia 08/04/2021   Acute right-sided weakness 01/22/2021   Peripheral polyneuropathy 01/22/2021   History of migraine headaches 01/22/2021   Difficulty with speech    Gastric bypass status for obesity 09/29/2019   Aftercare 08/12/2018   Pain in left knee 06/03/2018   Dyspnea 12/26/2017   Restrictive lung disease secondary to obesity 12/26/2017   Atypical chest pain 12/06/2017   Sinus tachycardia 12/06/2017   Chest pain 12/06/2017   DJD (degenerative joint disease), cervical 12/24/2016   Primary osteoarthritis of both hips 12/24/2016   Primary osteoarthritis of both knees 12/24/2016   H/O total knee replacement, right 12/24/2016   Spondylosis of lumbar region without myelopathy or radiculopathy 12/24/2016   Lumbosacral spondylosis without myelopathy 12/24/2016   Acute gout 05/30/2016   Gout 05/30/2016   Myalgia 03/29/2016   Other long term (current) drug therapy 03/29/2016   Idiopathic peripheral neuropathy 03/29/2016   Cannot sleep 03/29/2016   Migraine  without aura and responsive to treatment 03/29/2016   Multifocal myoclonus 03/29/2016   Restless leg 03/29/2016   Has a tremor 03/29/2016   History of aspiration pneumonitis 01/25/2016   History of acute bronchitis 01/25/2016   LPRD (laryngopharyngeal reflux disease) 01/25/2016   Imbalance 01/09/2016   Serotonin syndrome 12/22/2015   Essential hypertension 09/10/2015   OA (osteoarthritis) of knee 09/05/2015   Obesity 05/17/2015   OSA (obstructive sleep apnea) 03/16/2013   Iron deficiency 11/06/2012   Thrombocythemia 11/06/2012   Leukocytosis 11/05/2012   Impaired fasting glucose 07/23/2012   Bronchitis 04/03/2012   Kidney stone on left side 03/05/2012   Kidney cysts 03/04/2012   Loose stools 03/04/2012   Urinary frequency 12/04/2011   Restless leg syndrome 12/04/2011   Polypharmacy 12/04/2011   Bladder incontinence 12/04/2011   Fibromyalgia    Mixed hyperlipidemia    Edema    S/P endometrial ablation    Allergic rhinitis 09/18/2011   Depression, major, single episode, in partial remission ([place]) 04/21/2011   PRECORDIAL PAIN 02/17/2010   Anxiety state 01/30/2010   Migraine 01/30/2010   GERD without esophagitis 01/30/2010   Gastroparesis 01/30/2010    Past Medical History:  Diagnosis Date   Anemia    previously followed by Dr. Jamse Arn for anemia and elevated platelets   Anxiety    C. difficile colitis 10/01/2012   treated by [facility]   Chronic fatigue syndrome    Closed wedge compression fracture of T8 vertebra ([place]) 06/2021   DDD (degenerative disc disease), lumbar 08/19/2014   and facet arthroplasty & left lumbar radiculopathy (Dr.Ramos)   Depression    Dyssynergia    dyssynergenic defecation, contributing to fecal incontinence.  Edema    Fibromyalgia    Gastroparesis    followed at [facility]   GERD (gastroesophageal reflux disease)    History of kidney stones    History of vertebral fracture 06/30/2021   Hyperlipidemia    Kidney stone    Lumbar radiculopathy     Migraine    Neuropathy    Obstructive sleep apnea    Does  wear  CPAP   Paresthesia    Dr. Everette Rank at [facility]   Pelvic floor dysfunction    pelvic floor dyssynergy   Plantar fasciitis 02/2011   R foot   Pneumonia    2012   PONV (postoperative nausea and vomiting)    pt states has gastroparesis has difficulty taking antibiotics and narcotics has severe nausea and vomiting    Restless leg syndrome    S/P endometrial ablation 08/09/2006   Novasure Ablation   S/P epidural steroid injection 09/20/2014   Dr.Ramos   Tremor    Dr. Everette Rank   Urinary frequency    Urinary incontinence     Family History  Problem Relation Age of Onset   Allergies Mother    Hypertension Mother    Heart disease Mother        possible valve problem - leaking valve   Macular degeneration Mother    Heart disease Father        pacemaker, CHF   Hypertension Father    Diabetes Father        borderline   Stroke Father 66   Kidney disease Father    Asthma Sister    Irritable bowel syndrome Sister    Allergies Sister    Heart disease Paternal Grandmother    Heart disease Paternal Grandfather    Cancer Maternal Aunt        leukemia   Cancer Maternal Aunt    Colon cancer Maternal Aunt        late 60's   Heart disease Maternal Grandmother    Heart disease Maternal Grandfather    CAD Neg Hx    Past Surgical History:  Procedure Laterality Date   CHOLECYSTECTOMY  9/05   ENDOMETRIAL ABLATION  08/09/2006   Dr. Valentina Shaggy Ablation   FACET JOINT INJECTION  04/17/2017   Left L4-5 and L5-S1   GASTRIC ROUX-EN-Y N/A 09/29/2019   Procedure: LAPAROSCOPIC ROUX-EN-Y GASTRIC BYPASS WITH UPPER ENDOSCOPY, ERAS Pathway;  Surgeon: Johnathan Hausen, MD;  [facility];  Service: General;  Laterality: N/A;   [place]   R knee, Dr. Eddie Dibbles, torn cartilage   RETINAL LASER PROCEDURE Right 08/28/2018   laser retinopexy   RIGHT/LEFT HEART CATH AND CORONARY ANGIOGRAPHY N/A  01/01/2018   Procedure: RIGHT/LEFT HEART CATH AND CORONARY ANGIOGRAPHY;  Surgeon: Sherren Mocha, MD;  Location: [facility] CV LAB;  Service: Cardiovascular;  Laterality: N/A;   TONSILLECTOMY  1968   TONSILLECTOMY     TOTAL KNEE ARTHROPLASTY Right 09/05/2015   Procedure: RIGHT TOTAL KNEE ARTHROPLASTY;  Surgeon: Gaynelle Arabian, MD;  [facility];  Service: Orthopedics;  Laterality: Right;   TOTAL KNEE ARTHROPLASTY Left 07/14/2018   Procedure: LEFT TOTAL KNEE ARTHROPLASTY;  Surgeon: Gaynelle Arabian, MD;  [facility];  Service: Orthopedics;  Laterality: Left;   ULTRASOUND GUIDANCE FOR VASCULAR ACCESS  01/01/2018   Procedure: Ultrasound Guidance For Vascular Access;  Surgeon: Sherren Mocha, MD;  Location: [place] CV LAB;  Service: Cardiovascular;;   UMBILICAL HERNIA REPAIR N/A 09/29/2019   Procedure:  HERNIA REPAIR UMBILICAL ADULT;  Surgeon: Johnathan Hausen, MD;  [facility];  Service: General;  Laterality: N/A;   Social History   Social History Narrative   Married, 1 dog. 1 son in [place] (grandson born 06/2017), 1 stepson in [place], with 2 children   Immunization History  Administered Date(s) Administered   Influenza Inj Mdck Quad Pf 09/13/2019   Influenza Split 09/17/2011, 09/03/2012, 09/04/2013   Influenza,inj,Quad PF,6+ Mos 08/16/2014, 07/27/2015, 07/31/2017, 08/13/2018, 07/28/2020   Influenza-Unspecified 10/04/2016, 07/31/2017, 09/23/2017   PFIZER Comirnaty(Gray Top)Covid-19 Tri-Sucrose Vaccine 06/01/2021   PFIZER(Purple Top)SARS-COV-2 Vaccination 02/20/2020, 03/12/2020, 09/20/2020   Tdap 01/16/2011, 06/20/2017   Zoster Recombinat (Shingrix) 02/05/2019, 07/14/2019     Objective: Vital Signs: LMP 07/27/2006    Physical Exam   Musculoskeletal Exam: ***  CDAI Exam: CDAI Score: -- Patient Global: --; Provider Global: -- Swollen: --; Tender: -- Joint Exam 10/12/2021   No joint exam has been documented for this visit   There is currently no information documented  on the homunculus. Go to the Rheumatology activity and complete the homunculus joint exam.  Investigation: No additional findings.  Imaging: No results found.  Recent Labs: Lab Results  Component Value Date   WBC 8.3 08/24/2021   HGB 12.6 08/24/2021   PLT 384 08/24/2021   NA 140 08/24/2021   K 5.0 08/24/2021   CL 98 08/24/2021   CO2 28 08/24/2021   GLUCOSE 95 08/24/2021   BUN 20 08/24/2021   CREATININE 0.97 08/24/2021   BILITOT 0.2 06/28/2021   ALKPHOS 138 (H) 06/28/2021   AST 30 06/28/2021   ALT 30 06/28/2021   PROT 7.1 07/05/2021   ALBUMIN 3.9 06/28/2021   CALCIUM 8.9 08/24/2021   GFRAA 81 11/07/2020    Speciality Comments: Forteo started 08/07/21 (first dose in clinic)  Procedures:  No procedures performed Allergies: Erythromycin, Tramadol, and Dilaudid [hydromorphone hcl]   Assessment / Plan:     Visit Diagnoses: No diagnosis found.  Orders: No orders of the defined types were placed in this encounter.  No orders of the defined types were placed in this encounter.   Face-to-face time spent with patient was *** minutes. Greater than 50% of time was spent in counseling and coordination of care.  Follow-Up Instructions: No follow-ups on file.   Earnestine Mealing, CMA  Note - This record has been created using Editor, commissioning.  Chart creation errors have been sought, but may not always  have been located. Such creation errors do not reflect on  the standard of medical care.

## 2021-10-02 NOTE — Therapy (Signed)
OUTPATIENT PHYSICAL THERAPY TREATMENT NOTE   Patient Name: [name] MRN: [id number] DOB:[birth year], 65 y.o., female Today's Date: 10/02/2021  PCP: Joselyn Arrow, MD REFERRING PROVIDER: Joselyn Arrow, MD   PT End of Session - 10/02/21 0955     Visit Number 3    Number of Visits 12    Date for PT Re-Evaluation 11/06/21    Authorization Type [employer]    PT Start Time [phone]    PT Stop Time 1030    PT Time Calculation (min) 44 min    Activity Tolerance Patient tolerated treatment well    Behavior During Therapy [facility] for tasks assessed/performed             Past Medical History:  Diagnosis Date   Anemia    previously followed by Dr. Dalene Carrow for anemia and elevated platelets   Anxiety    C. difficile colitis 10/01/2012   treated by [facility]   Chronic fatigue syndrome    Closed wedge compression fracture of T8 vertebra (HCC) 06/2021   DDD (degenerative disc disease), lumbar 08/19/2014   and facet arthroplasty & left lumbar radiculopathy (Dr.Ramos)   Depression    Dyssynergia    dyssynergenic defecation, contributing to fecal incontinence.   Edema    Fibromyalgia    Gastroparesis    followed at [facility]   GERD (gastroesophageal reflux disease)    History of kidney stones    History of vertebral fracture 06/30/2021   Hyperlipidemia    Kidney stone    Lumbar radiculopathy    Migraine    Neuropathy    Obstructive sleep apnea    Does  wear  CPAP   Paresthesia    Dr. Antonietta Barcelona at [facility]   Pelvic floor dysfunction    pelvic floor dyssynergy   Plantar fasciitis 02/2011   R foot   Pneumonia    2012   PONV (postoperative nausea and vomiting)    pt states has gastroparesis has difficulty taking antibiotics and narcotics has severe nausea and vomiting    Restless leg syndrome    S/P endometrial ablation 08/09/2006   Novasure Ablation   S/P epidural steroid injection 09/20/2014   Dr.Ramos   Tremor    Dr. Antonietta Barcelona   Urinary frequency    Urinary incontinence     Past Surgical History:  Procedure Laterality Date   CHOLECYSTECTOMY  9/05   ENDOMETRIAL ABLATION  08/09/2006   Dr. Kingsley Plan Ablation   FACET JOINT INJECTION  04/17/2017   Left L4-5 and L5-S1   GASTRIC ROUX-EN-Y N/A 09/29/2019   Procedure: LAPAROSCOPIC ROUX-EN-Y GASTRIC BYPASS WITH UPPER ENDOSCOPY, ERAS Pathway;  Surgeon: Luretha Murphy, MD;  Location: [facility];  Service: General;  Laterality: N/A;   KNEE ARTHROPLASTY     KNEE SURGERY  1999   R knee, Dr. Renae Fickle, torn cartilage   RETINAL LASER PROCEDURE Right 08/28/2018   laser retinopexy   RIGHT/LEFT HEART CATH AND CORONARY ANGIOGRAPHY N/A 01/01/2018   Procedure: RIGHT/LEFT HEART CATH AND CORONARY ANGIOGRAPHY;  Surgeon: Tonny Bollman, MD;  Location: [facility] INVASIVE CV LAB;  Service: Cardiovascular;  Laterality: N/A;   TONSILLECTOMY  1968   TONSILLECTOMY     TOTAL KNEE ARTHROPLASTY Right 09/05/2015   Procedure: RIGHT TOTAL KNEE ARTHROPLASTY;  Surgeon: Ollen Gross, MD;  Location: [facility];  Service: Orthopedics;  Laterality: Right;   TOTAL KNEE ARTHROPLASTY Left 07/14/2018   Procedure: LEFT TOTAL KNEE ARTHROPLASTY;  Surgeon: Ollen Gross, MD;  Location: [facility];  Service: Orthopedics;  Laterality: Left;  ULTRASOUND GUIDANCE FOR VASCULAR ACCESS  01/01/2018   Procedure: Ultrasound Guidance For Vascular Access;  Surgeon: Tonny Bollman, MD;  Location[facility] INVASIVE CV LAB;  Service: Cardiovascular;;   UMBILICAL HERNIA REPAIR N/A 09/29/2019   Procedure: HERNIA REPAIR UMBILICAL ADULT;  Surgeon: Luretha Murphy, MD;  Location: [facility];  Service: General;  Laterality: N/A;   Patient Active Problem List   Diagnosis Date Noted   Osteopenia 08/04/2021   Acute right-sided weakness 01/22/2021   Peripheral polyneuropathy 01/22/2021   History of migraine headaches 01/22/2021   Difficulty with speech    Gastric bypass status for obesity 09/29/2019   Aftercare 08/12/2018   Pain in left knee 06/03/2018   Dyspnea 12/26/2017   Restrictive lung  disease secondary to obesity 12/26/2017   Atypical chest pain 12/06/2017   Sinus tachycardia 12/06/2017   Chest pain 12/06/2017   DJD (degenerative joint disease), cervical 12/24/2016   Primary osteoarthritis of both hips 12/24/2016   Primary osteoarthritis of both knees 12/24/2016   H/O total knee replacement, right 12/24/2016   Spondylosis of lumbar region without myelopathy or radiculopathy 12/24/2016   Lumbosacral spondylosis without myelopathy 12/24/2016   Acute gout 05/30/2016   Gout 05/30/2016   Myalgia 03/29/2016   Other long term (current) drug therapy 03/29/2016   Idiopathic peripheral neuropathy 03/29/2016   Cannot sleep 03/29/2016   Migraine without aura and responsive to treatment 03/29/2016   Multifocal myoclonus 03/29/2016   Restless leg 03/29/2016   Has a tremor 03/29/2016   History of aspiration pneumonitis 01/25/2016   History of acute bronchitis 01/25/2016   LPRD (laryngopharyngeal reflux disease) 01/25/2016   Imbalance 01/09/2016   Serotonin syndrome 12/22/2015   Essential hypertension 09/10/2015   OA (osteoarthritis) of knee 09/05/2015   Obesity 05/17/2015   OSA (obstructive sleep apnea) 03/16/2013   Iron deficiency 11/06/2012   Thrombocythemia 11/06/2012   Leukocytosis 11/05/2012   Impaired fasting glucose 07/23/2012   Bronchitis 04/03/2012   Kidney stone on left side 03/05/2012   Kidney cysts 03/04/2012   Loose stools 03/04/2012   Urinary frequency 12/04/2011   Restless leg syndrome 12/04/2011   Polypharmacy 12/04/2011   Bladder incontinence 12/04/2011   Fibromyalgia    Mixed hyperlipidemia    Edema    S/P endometrial ablation    Allergic rhinitis 09/18/2011   Depression, major, single episode, in partial remission (HCC) 04/21/2011   PRECORDIAL PAIN 02/17/2010   Anxiety state 01/30/2010   Migraine 01/30/2010   GERD without esophagitis 01/30/2010   Gastroparesis 01/30/2010    REFERRING DIAG: M48.54xA Collapsed vertebra, thoracic region.   (Compression Fx of Thoracic Vertebra)  THERAPY DIAG:  Abnormal posture  Chronic pain of right knee  Difficulty in walking, not elsewhere classified  Muscle weakness (generalized)  Unsteadiness on feet  Other abnormalities of gait and mobility  P  PRECAUTIONS: Other: Hx of compression Fractures and osteoporosis  SUBJECTIVE: "My appointment for neurosurgeon changed until Wed"  PAIN:  Are you having pain? Yes VAS scale: 5/10 10/02/21 Pain location: T8 area with some radiation anteriorly Pain orientation: Right  PAIN TYPE: aching Pain description: constant  Aggravating factors: standing Relieving factors: lyingdown/oxycodone    OBJECTIVE:  PERTINENT HISTORY:  Hx of compression Fractures (T12 and L1), osteoporosis, fibromyalgia, obesity, Peripharal neuropathy, Bilat TKA, tachycardia, cervical and lumbar spondylosis, and gastric bypass surgery.   PAIN:  Are you having pain? Yes NPRS scale: 5/10 current, 10/10 worst, 2-3/10 best Pain location: mid thoracic PAIN TYPE: aching and sharp Pain description: constant  Aggravating factors: standing,  sitting Relieving factors: medication, hot showers occasionally     PATIENT EDUCATION:  Education details: Educated pt in dx and relevant spinal anatomy.  Used a spinal model to educate pt.  Instructed pt in importance of not slumping over and not bending forward.  Instructed pt to in having a good erect posture including shoulders retracted and neutral spine.  Instructed pt to avoid lifting.  Educated pt in aquatic rehab process benefits of aquatic PT, and POC.  Answered Pt's questions.   Person educated: Patient Education method: Explanation, spinal model, demonstration Education comprehension: verbalized understanding  HOME EXERCISE PROGRAM: Shoulder shrugging and shoulder retraction   ASSESSMENT:   CLINICAL IMPRESSION: Pt reports no adverse issues after last visit. Continues with increased pain getting OOB. Increased some  reps and trials of exercises as tolerated. Kept pt submerged to T9-10 level to decrease pain with exercise. She tolerates well, no increase in discomfort. Able to add balance challenges to pull in lower core strengthening, standing on noodle.  Objective impairments include Abnormal gait, decreased activity tolerance, decreased endurance, decreased mobility, difficulty walking, decreased ROM, decreased strength, hypomobility, impaired flexibility, impaired UE functional use, postural dysfunction, and pain. These impairments are limiting patient from cleaning, community activity, meal prep, laundry, shopping, and ambulation . Personal factors including 3+ comorbidities: Hx of compression Fractures (T12 and L1), osteoporosis, fibromyalgia, obesity, Peripharal neuropathy, Bilat TKA, and cervical and lumbar spondylosis.  are also affecting patient's functional outcome. Patient should benefit from skilled PT to address above impairments and improve overall function.  GOALS:     SHORT TERM GOALS:   STG Name Target Date Goal status  1 Pt will tolerate aquatic therapy without adverse effects for improved tolerance to activity and mobility.  Baseline:  10/09/2021 INITIAL  2 Pt will demo improved shoulder flexion and scaption AROM by at least 10-15 deg bilat for improved reaching and IADLs.  Baseline:  10/16/2021 INITIAL  3 Pt will report her worst pain to be less than 8/10 for improved bed mobility and getting OOB. Baseline: 10/16/2021 INITIAL                                        LONG TERM GOALS:    LTG Name Target Date Goal status  1 Pt will be able to perform her normal ambulation distance without significant pain or limitation.  Baseline: 11/06/2021 INITIAL  2 Pt will report improved tolerance with standing activities including to be able to perform her normal household chores without significant pain with exceptions of lifting.  Baseline: 11/06/2021 INITIAL  3 Pt will report at least a 65-70%  improvement in her daily mobility.  Baseline: 11/06/2021 INITIAL  4 Pt will demo improved quality of gait including increased pelvic rotation, reduced limp, = stance time, and increased speed. Baseline: 10/23/2021 INITIAL    PLAN: PT FREQUENCY: 2x/week   PT DURATION: other: 4-6 weeks   PLANNED INTERVENTIONS: Therapeutic exercises, Therapeutic activity, Neuro Muscular re-education, Balance training, Gait training, Patient/Family education, Stair training, Aquatic Therapy, Cryotherapy, Moist heat, Taping, Ultrasound, and Manual therapy      TODAY'S TREATMENT: 11/7 Pt seen for aquatic therapy today.  Treatment took place in water 3.25-4.8 ft in depth at the [facility] pool. Temp of water was 91.  Pt entered/exited the pool via stairs step to pattern independently with bilat rail.  Warm up: water walking forwar, back and side stepping 4 widths  each. Cues for increased step length   Standing  Gentle stretching of rhomboids/all core submerged 85% (above xiphoid for benefit of buoyancy to decrease pain) Strengthening -using 1 foam buoy: horizontal abd/add 2x10 foam on surface. 2x10 with foam buoys submerged 1-2 inches for added resistance. -shoulder flex x10  -abd/add x10 -shoulder retraction 2 x 10 -shoulder shrugging with hand bouys submerged at waist 2x10 -hip hinges 2x12 -kickboard push downs 3 x 12 -kick board resisted lateral rotations 2x 10 L/R  Balance Balance and core strength challenges standing with wide BOS on noodle.  Gaining static balance with heels and then toes on ground x 30 sec (requiring many trails); Than progressed to rotating over noodle gaining balance heel and toe x 5 reps.  Decreased BOS in 2 more intervals.    Pt requires buoyancy for support and to offload joints with strengthening exercises. Viscosity of the water is needed for resistance of strengthening; water current perturbations provides challenge to standing balance unsupported, requiring  increased core activation.   11/3 Pt seen for aquatic therapy today.  Treatment took place in water 3.25-4.8 ft in depth at the [facility] pool. Temp of water was 91.  Pt entered/exited the pool via stairs step to pattern independently with bilat rail.  Pt entered water for aquatic therapy for first time and was introduced to principles and therapeutic effects of water as she ambulated and acclimated to pool. Pt ambulates forward across pool , then backwards to engage posterior chain and then sidestepping   Standing  Gentle stretching of rhomboids/all core submerged 85% (above xiphoid for benefit of buoyancy to decrease pain) Strengthening -using 1 foam buoy: horizontal abd/add 2x10 foam on surface. 2x10 with foam buoys submerged 1-2 inches for added resistance. -shoulder flex x10  -abd/add x10 -shoulder retraction 2 x 10 -shoulder shrugging with hand bouys submerged at waist 2x10 -hip hinges 2x10 -kickboard push downs 3 x 10 -kick board resisted lateral rotations x 10  Pt requires buoyancy for support and to offload joints with strengthening exercises. Viscosity of the water is needed for resistance of strengthening; water current perturbations provides challenge to standing balance unsupported, requiring increased core activation.      PATIENT EDUCATION: properties of water; benefits of aquatic therapy  PLAN FOR NEXT SESSION: muscle strengthening involving T8 musculature/balance.    Margo Lama Tomma Lightning) Flower Franko MPT 10/02/2021, 3:58 PM

## 2021-10-03 ENCOUNTER — Ambulatory Visit (HOSPITAL_BASED_OUTPATIENT_CLINIC_OR_DEPARTMENT_OTHER): Payer: Medicare Other | Admitting: Physical Therapy

## 2021-10-04 ENCOUNTER — Ambulatory Visit: Payer: Medicare Other | Admitting: Rheumatology

## 2021-10-04 DIAGNOSIS — S22078A Other fracture of T9-T10 vertebra, initial encounter for closed fracture: Secondary | ICD-10-CM | POA: Diagnosis not present

## 2021-10-04 DIAGNOSIS — S22060A Wedge compression fracture of T7-T8 vertebra, initial encounter for closed fracture: Secondary | ICD-10-CM | POA: Diagnosis not present

## 2021-10-05 ENCOUNTER — Ambulatory Visit (HOSPITAL_BASED_OUTPATIENT_CLINIC_OR_DEPARTMENT_OTHER): Payer: Medicare Other | Admitting: Physical Therapy

## 2021-10-05 ENCOUNTER — Other Ambulatory Visit: Payer: Self-pay

## 2021-10-05 DIAGNOSIS — M6281 Muscle weakness (generalized): Secondary | ICD-10-CM | POA: Diagnosis not present

## 2021-10-05 DIAGNOSIS — G8929 Other chronic pain: Secondary | ICD-10-CM | POA: Diagnosis not present

## 2021-10-05 DIAGNOSIS — R262 Difficulty in walking, not elsewhere classified: Secondary | ICD-10-CM | POA: Diagnosis not present

## 2021-10-05 DIAGNOSIS — M546 Pain in thoracic spine: Secondary | ICD-10-CM | POA: Diagnosis not present

## 2021-10-05 DIAGNOSIS — M25612 Stiffness of left shoulder, not elsewhere classified: Secondary | ICD-10-CM | POA: Diagnosis not present

## 2021-10-05 DIAGNOSIS — R293 Abnormal posture: Secondary | ICD-10-CM | POA: Diagnosis not present

## 2021-10-05 DIAGNOSIS — M25561 Pain in right knee: Secondary | ICD-10-CM | POA: Diagnosis not present

## 2021-10-05 DIAGNOSIS — R2689 Other abnormalities of gait and mobility: Secondary | ICD-10-CM | POA: Diagnosis not present

## 2021-10-05 DIAGNOSIS — R2681 Unsteadiness on feet: Secondary | ICD-10-CM | POA: Diagnosis not present

## 2021-10-05 DIAGNOSIS — M25611 Stiffness of right shoulder, not elsewhere classified: Secondary | ICD-10-CM | POA: Diagnosis not present

## 2021-10-05 NOTE — Therapy (Addendum)
OUTPATIENT PHYSICAL THERAPY TREATMENT NOTE   Patient Name: [name] MRN: [id number] DOB:October 02, 1956, 65 y.o., female Today's Date: 10/06/2021  PCP: Joselyn Arrow, MD REFERRING PROVIDER: Joselyn Arrow, MD   PT End of Session - 10/05/21 1449     Visit Number 4    Number of Visits 12    Date for PT Re-Evaluation 11/06/21    Authorization Type [employer] Medicare    PT Start Time 1402    PT Stop Time 1435    PT Time Calculation (min) 33 min    Activity Tolerance Patient tolerated treatment well    Behavior During Therapy [facility] for tasks assessed/performed              Past Medical History:  Diagnosis Date   Anemia    previously followed by Dr. Dalene Carrow for anemia and elevated platelets   Anxiety    C. difficile colitis 10/01/2012   treated by [facility]   Chronic fatigue syndrome    Closed wedge compression fracture of T8 vertebra (HCC) 06/2021   DDD (degenerative disc disease), lumbar 08/19/2014   and facet arthroplasty & left lumbar radiculopathy (Dr.Ramos)   Depression    Dyssynergia    dyssynergenic defecation, contributing to fecal incontinence.   Edema    Fibromyalgia    Gastroparesis    followed at [facility]   GERD (gastroesophageal reflux disease)    History of kidney stones    History of vertebral fracture 06/30/2021   Hyperlipidemia    Kidney stone    Lumbar radiculopathy    Migraine    Neuropathy    Obstructive sleep apnea    Does  wear  CPAP   Paresthesia    Dr. Antonietta Barcelona at [facility]   Pelvic floor dysfunction    pelvic floor dyssynergy   Plantar fasciitis 02/2011   R foot   Pneumonia    2012   PONV (postoperative nausea and vomiting)    pt states has gastroparesis has difficulty taking antibiotics and narcotics has severe nausea and vomiting    Restless leg syndrome    S/P endometrial ablation 08/09/2006   Novasure Ablation   S/P epidural steroid injection 09/20/2014   Dr.Ramos   Tremor    Dr. Antonietta Barcelona   Urinary frequency    Urinary incontinence     Past Surgical History:  Procedure Laterality Date   CHOLECYSTECTOMY  9/05   ENDOMETRIAL ABLATION  08/09/2006   Dr. Kingsley Plan Ablation   FACET JOINT INJECTION  04/17/2017   Left L4-5 and L5-S1   GASTRIC ROUX-EN-Y N/A 09/29/2019   Procedure: LAPAROSCOPIC ROUX-EN-Y GASTRIC BYPASS WITH UPPER ENDOSCOPY, ERAS Pathway;  Surgeon: Luretha Murphy, MD;  Location: [facility];  Service: General;  Laterality: N/A;   KNEE ARTHROPLASTY     KNEE SURGERY  1999   R knee, Dr. Renae Fickle, torn cartilage   RETINAL LASER PROCEDURE Right 08/28/2018   laser retinopexy   RIGHT/LEFT HEART CATH AND CORONARY ANGIOGRAPHY N/A 01/01/2018   Procedure: RIGHT/LEFT HEART CATH AND CORONARY ANGIOGRAPHY;  Surgeon: Tonny Bollman, MD;  Location: [facility] INVASIVE CV LAB;  Service: Cardiovascular;  Laterality: N/A;   TONSILLECTOMY  1968   TONSILLECTOMY     TOTAL KNEE ARTHROPLASTY Right 09/05/2015   Procedure: RIGHT TOTAL KNEE ARTHROPLASTY;  Surgeon: Ollen Gross, MD;  Location: [facility];  Service: Orthopedics;  Laterality: Right;   TOTAL KNEE ARTHROPLASTY Left 07/14/2018   Procedure: LEFT TOTAL KNEE ARTHROPLASTY;  Surgeon: Ollen Gross, MD;  Location: [facility];  Service: Orthopedics;  Laterality:  Left;   ULTRASOUND GUIDANCE FOR VASCULAR ACCESS  01/01/2018   Procedure: Ultrasound Guidance For Vascular Access;  Surgeon: Tonny Bollman, MD;  Location: [facility] INVASIVE CV LAB;  Service: Cardiovascular;;   UMBILICAL HERNIA REPAIR N/A 09/29/2019   Procedure: HERNIA REPAIR UMBILICAL ADULT;  Surgeon: Luretha Murphy, MD;  Location: [facility];  Service: General;  Laterality: N/A;   Patient Active Problem List   Diagnosis Date Noted   Osteopenia 08/04/2021   Acute right-sided weakness 01/22/2021   Peripheral polyneuropathy 01/22/2021   History of migraine headaches 01/22/2021   Difficulty with speech    Gastric bypass status for obesity 09/29/2019   Aftercare 08/12/2018   Pain in left knee 06/03/2018   Dyspnea 12/26/2017   Restrictive lung  disease secondary to obesity 12/26/2017   Atypical chest pain 12/06/2017   Sinus tachycardia 12/06/2017   Chest pain 12/06/2017   DJD (degenerative joint disease), cervical 12/24/2016   Primary osteoarthritis of both hips 12/24/2016   Primary osteoarthritis of both knees 12/24/2016   H/O total knee replacement, right 12/24/2016   Spondylosis of lumbar region without myelopathy or radiculopathy 12/24/2016   Lumbosacral spondylosis without myelopathy 12/24/2016   Acute gout 05/30/2016   Gout 05/30/2016   Myalgia 03/29/2016   Other long term (current) drug therapy 03/29/2016   Idiopathic peripheral neuropathy 03/29/2016   Cannot sleep 03/29/2016   Migraine without aura and responsive to treatment 03/29/2016   Multifocal myoclonus 03/29/2016   Restless leg 03/29/2016   Has a tremor 03/29/2016   History of aspiration pneumonitis 01/25/2016   History of acute bronchitis 01/25/2016   LPRD (laryngopharyngeal reflux disease) 01/25/2016   Imbalance 01/09/2016   Serotonin syndrome 12/22/2015   Essential hypertension 09/10/2015   OA (osteoarthritis) of knee 09/05/2015   Obesity 05/17/2015   OSA (obstructive sleep apnea) 03/16/2013   Iron deficiency 11/06/2012   Thrombocythemia 11/06/2012   Leukocytosis 11/05/2012   Impaired fasting glucose 07/23/2012   Bronchitis 04/03/2012   Kidney stone on left side 03/05/2012   Kidney cysts 03/04/2012   Loose stools 03/04/2012   Urinary frequency 12/04/2011   Restless leg syndrome 12/04/2011   Polypharmacy 12/04/2011   Bladder incontinence 12/04/2011   Fibromyalgia    Mixed hyperlipidemia    Edema    S/P endometrial ablation    Allergic rhinitis 09/18/2011   Depression, major, single episode, in partial remission (HCC) 04/21/2011   PRECORDIAL PAIN 02/17/2010   Anxiety state 01/30/2010   Migraine 01/30/2010   GERD without esophagitis 01/30/2010   Gastroparesis 01/30/2010    REFERRING DIAG: M48.54xA Collapsed vertebra, thoracic region.   (Compression Fx of Thoracic Vertebra)  THERAPY DIAG:  Pain in thoracic spine  Difficulty in walking, not elsewhere classified  Stiffness of right shoulder, not elsewhere classified  Stiffness of left shoulder, not elsewhere classified    PRECAUTIONS: Other: Hx of compression Fractures and osteoporosis  SUBJECTIVE:  Pt denies any adverse effects after prior Rx.  Pt reports 6/10 pain currently in R shoulder and down R UE.  Pt states her fibromyalgia is acting up.  Pt reports 4/10 pain in thoracic area.  Pt states that when she saw Dr. Shon Baton, her informed her that vertebrae hasn't changed in 3 months.  He informed her her compression fracture was not healing as fast as they would like.  It's not worse, though not better either.  Pt saw neurosurgeon yesterday.  He reviewed her diagnostic findings and consulted with pt.  Pt states he recommended surgery and is planning on kyphoplasty in  December.      PAIN:  Are you having pain? Yes NRPS scale: 4/10  Pain location: Mid thoracic Pain orientation: Right  PAIN TYPE: aching Pain description: constant  Aggravating factors: standing     PERTINENT HISTORY:  Hx of compression Fractures (T12 and L1), osteoporosis, fibromyalgia, obesity, Peripharal neuropathy, Bilat TKA, tachycardia, cervical and lumbar spondylosis, and gastric bypass surgery.  OBJECTIVE: Today's Treatment:   -Reviewed response to prior Rx, pt presentation, and pain level.   -Pt performed:   -Scapular retractions in sitting and standing   -Shoulder rolls in standing and sitting   -Supine TrA contraction with 5 sec hold.  Pt was educated in correct palpation and performance of TrA contraction.   -Gave pt a HEP handout and educated pt in correct form and appropriate frequency.  -See below for pt education provided.  PATIENT EDUCATION:  Education details:  Spent extensive time educating pt concerning POC including due to compression fx not healing and MD recommending  surgery we will just focus on aquatic PT and not land at this time.  Educated pt concerning dx and relevant spinal anatomy.  Instructed pt in importance of not slumping over and not bending forward.  Instructed pt to avoid lifting.  Answered Pt's questions.  Instructed pt in correct palpation and performance of TrA contraction.  Pt received a HEP handout and was educated in correct form and appropriate frequency.  Instructed pt she could also perform TrA contraction in sitting.  Pt instructed to perform gentle scap retraction in sitting and she should not have pain with HEP.     Person educated: Patient Education method: Explanation, demonstration, verbal cues, and handout Education comprehension: verbalized understanding, returned demonstration, verbal cues required  HOME EXERCISE PROGRAM: Access Code: [email or web address] URL: [email or web address] Date: 10/05/2021 Prepared by: Aaron Edelman  Exercises Supine Transversus Abdominis Bracing - Hands on Stomach - 2 x daily - 7 x weekly - 1-2 sets - 10 reps - 5 second hold Seated Scapular Retraction - 2 x daily - 7 x weekly - 1-2 sets - 10 reps Seated Shoulder Shrug Circles AROM Backward - 2 x daily - 7 x weekly - 1-2 sets - 10 reps    ASSESSMENT:   CLINICAL IMPRESSION: Pt presents to Rx after meeting with neurosurgeon and informs PT she is planning on having surgery in December.  Pt had pain with scapular retraction in standing which improved when she performed in sitting.  Pt performed scapular retraction and shoulder rolls both in standing and sitting.  Pt reports feeling better performing exercises in sitting.  She c/o's of pain with standing in general.  PT established gentle HEP for pt to work on to improve posture and core strength as she is preparing for surgery.  PT spent time with pt discussing POC.  Pt responded well to Rx having no increased pain after Rx.    Objective impairments include Abnormal gait, decreased activity tolerance,  decreased endurance, decreased mobility, difficulty walking, decreased ROM, decreased strength, hypomobility, impaired flexibility, impaired UE functional use, postural dysfunction, and pain. These impairments are limiting patient from cleaning, community activity, meal prep, laundry, shopping, and ambulation . Personal factors including 3+ comorbidities: Hx of compression Fractures (T12 and L1), osteoporosis, fibromyalgia, obesity, Peripharal neuropathy, Bilat TKA, and cervical and lumbar spondylosis.  are also affecting patient's functional outcome.   GOALS:     SHORT TERM GOALS:   STG Name Target Date Goal status  1 Pt will tolerate aquatic therapy without adverse effects for  improved tolerance to activity and mobility.  Baseline:  10/09/2021 INITIAL  2 Pt will demo improved shoulder flexion and scaption AROM by at least 10-15 deg bilat for improved reaching and IADLs.  Baseline:  10/16/2021 INITIAL  3 Pt will report her worst pain to be less than 8/10 for improved bed mobility and getting OOB. Baseline: 10/16/2021 INITIAL                                        LONG TERM GOALS:    LTG Name Target Date Goal status  1 Pt will be able to perform her normal ambulation distance without significant pain or limitation.  Baseline: 11/06/2021 INITIAL  2 Pt will report improved tolerance with standing activities including to be able to perform her normal household chores without significant pain with exceptions of lifting.  Baseline: 11/06/2021 INITIAL  3 Pt will report at least a 65-70% improvement in her daily mobility.  Baseline: 11/06/2021 INITIAL  4 Pt will demo improved quality of gait including increased pelvic rotation, reduced limp, = stance time, and increased speed. Baseline: 10/23/2021 INITIAL    PLAN:    PLANNED INTERVENTIONS: Therapeutic exercises, Therapeutic activity, Neuro Muscular re-education, Balance training, Gait training, Patient/Family education, Stair training,  Aquatic Therapy, Cryotherapy, Moist heat, Taping, Ultrasound, and Manual therapy     PLAN FOR NEXT SESSION: Pt to focus on just aquatic therapy and not perform land therapy at this time due to pt's compression fx not healing and pt preparing for surgery.    Audie Clear III PT, DPT 10/06/21 12:24 AM  PHYSICAL THERAPY DISCHARGE SUMMARY  Visits from Start of Care: 4  Current functional level related to goals / functional outcomes: See above   Remaining deficits: See above   Education / Equipment: See above   Pt received PT from 08/3121 to 10/05/21.  Pt presented to Rx on 10/05/2021 after meeting with neurosurgeon who recommended kyphoplasty.  Pt is planning on having surgery in December and the plan was just to focus on aquatic therapy.  Pt then cancelled her appointments and did not call back to reschedule.  Unable to assess current functional status or goals due to pt not being present at discharge.  Pt will be considered discharged at this time.    Audie Clear III PT, DPT 05/19/22 9:28 AM

## 2021-10-10 ENCOUNTER — Ambulatory Visit (HOSPITAL_BASED_OUTPATIENT_CLINIC_OR_DEPARTMENT_OTHER): Payer: Medicare Other | Admitting: Physical Therapy

## 2021-10-12 ENCOUNTER — Ambulatory Visit: Payer: Medicare Other | Admitting: Physician Assistant

## 2021-10-12 ENCOUNTER — Ambulatory Visit (HOSPITAL_BASED_OUTPATIENT_CLINIC_OR_DEPARTMENT_OTHER): Payer: Medicare Other | Admitting: Physical Therapy

## 2021-10-12 DIAGNOSIS — M160 Bilateral primary osteoarthritis of hip: Secondary | ICD-10-CM

## 2021-10-12 DIAGNOSIS — M19041 Primary osteoarthritis, right hand: Secondary | ICD-10-CM

## 2021-10-12 DIAGNOSIS — M62838 Other muscle spasm: Secondary | ICD-10-CM

## 2021-10-12 DIAGNOSIS — Z79899 Other long term (current) drug therapy: Secondary | ICD-10-CM

## 2021-10-12 DIAGNOSIS — Z96653 Presence of artificial knee joint, bilateral: Secondary | ICD-10-CM

## 2021-10-12 DIAGNOSIS — M797 Fibromyalgia: Secondary | ICD-10-CM

## 2021-10-12 DIAGNOSIS — Z8781 Personal history of (healed) traumatic fracture: Secondary | ICD-10-CM

## 2021-10-12 DIAGNOSIS — M5030 Other cervical disc degeneration, unspecified cervical region: Secondary | ICD-10-CM

## 2021-10-12 DIAGNOSIS — M8000XA Age-related osteoporosis with current pathological fracture, unspecified site, initial encounter for fracture: Secondary | ICD-10-CM

## 2021-10-12 DIAGNOSIS — M5136 Other intervertebral disc degeneration, lumbar region: Secondary | ICD-10-CM

## 2021-10-12 DIAGNOSIS — Z8659 Personal history of other mental and behavioral disorders: Secondary | ICD-10-CM

## 2021-10-12 DIAGNOSIS — R5383 Other fatigue: Secondary | ICD-10-CM

## 2021-10-12 DIAGNOSIS — M1A09X0 Idiopathic chronic gout, multiple sites, without tophus (tophi): Secondary | ICD-10-CM

## 2021-10-12 DIAGNOSIS — E559 Vitamin D deficiency, unspecified: Secondary | ICD-10-CM

## 2021-10-12 DIAGNOSIS — Z8669 Personal history of other diseases of the nervous system and sense organs: Secondary | ICD-10-CM

## 2021-10-13 DIAGNOSIS — S22060A Wedge compression fracture of T7-T8 vertebra, initial encounter for closed fracture: Secondary | ICD-10-CM | POA: Diagnosis not present

## 2021-10-17 ENCOUNTER — Encounter (HOSPITAL_BASED_OUTPATIENT_CLINIC_OR_DEPARTMENT_OTHER): Payer: Medicare Other | Admitting: Physical Therapy

## 2021-10-22 ENCOUNTER — Other Ambulatory Visit: Payer: Self-pay | Admitting: Physician Assistant

## 2021-10-23 ENCOUNTER — Ambulatory Visit: Payer: Medicare Other | Admitting: Podiatry

## 2021-10-23 NOTE — Telephone Encounter (Signed)
Next Visit: Due November 2022. Message sent to the front to schedule    Last Visit: 07/05/2021   Last Fill: 06/12/2021   Dx: Fibromyalgia   Current Dose per office note on 05/01/2021: methocarbamol 500 mg 1 tablet by mouth daily as needed for muscle spasms   Okay to refill Methocarbamol?

## 2021-10-23 NOTE — Telephone Encounter (Signed)
Please schedule patient for a follow up visit. Patient due November 2022. Thanks!

## 2021-10-23 NOTE — Telephone Encounter (Signed)
Patient canceled upcoming appointment on 10/09/2021 for  10/27/2021 due to upcoming surgery. Patient requested to call back once she is ready to reschedule.

## 2021-10-25 ENCOUNTER — Other Ambulatory Visit: Payer: Self-pay | Admitting: Physician Assistant

## 2021-10-26 ENCOUNTER — Ambulatory Visit: Payer: Medicare Other | Admitting: Physician Assistant

## 2021-10-26 DIAGNOSIS — S22060A Wedge compression fracture of T7-T8 vertebra, initial encounter for closed fracture: Secondary | ICD-10-CM | POA: Diagnosis not present

## 2021-10-26 DIAGNOSIS — M8008XA Age-related osteoporosis with current pathological fracture, vertebra(e), initial encounter for fracture: Secondary | ICD-10-CM | POA: Diagnosis not present

## 2021-10-26 DIAGNOSIS — S22070A Wedge compression fracture of T9-T10 vertebra, initial encounter for closed fracture: Secondary | ICD-10-CM | POA: Diagnosis not present

## 2021-10-26 NOTE — Telephone Encounter (Signed)
Next Visit: Due November 2022. Message sent to the front to schedule    Last Visit: 07/05/2021   Last Fill: 07/26/2021  Dx: Other fatigue    Current Dose per office note on 05/01/2021: armodafinil 250 mg daily with breakfast   Okay to refill Armodafinil?

## 2021-11-06 HISTORY — PX: KYPHOPLASTY: SHX5884

## 2021-11-07 ENCOUNTER — Ambulatory Visit: Payer: Medicare Other | Admitting: Podiatry

## 2021-11-07 ENCOUNTER — Other Ambulatory Visit: Payer: Self-pay

## 2021-11-07 DIAGNOSIS — L97511 Non-pressure chronic ulcer of other part of right foot limited to breakdown of skin: Secondary | ICD-10-CM | POA: Diagnosis not present

## 2021-11-07 MED ORDER — MUPIROCIN 2 % EX OINT: 1.0000 "application " | TOPICAL_OINTMENT | Freq: Every day | CUTANEOUS | 2 refills | Status: DC

## 2021-11-07 MED ORDER — DOXYCYCLINE HYCLATE 100 MG PO TABS: 100.0000 mg | ORAL_TABLET | Freq: Two times a day (BID) | ORAL | 0 refills | Status: DC

## 2021-11-08 ENCOUNTER — Telehealth: Payer: Self-pay | Admitting: Rheumatology

## 2021-11-08 NOTE — Telephone Encounter (Signed)
error 

## 2021-11-08 NOTE — Progress Notes (Signed)
°  Subjective:  Patient ID: [name], female    DOB: 21-Feb-1956,  MRN: [id number]  Chief Complaint  Patient presents with   Callouses     right foot great toe injury/2nd toe has hard place    65 y.o. female presents with the above complaint. History confirmed with patient.  She had a second toe callus and blister that started when she was at church and scraped her toe on a paver.  She is concerned about infection.  She has been using Neosporin and a Band-Aid on this.  Objective:  Physical Exam: warm, good capillary refill, no trophic changes or ulcerative lesions, normal DP and PT pulses, and normal sensory exam.  Right Foot: Partial thickness ulceration limited to breakdown of skin of the right second plantar tip of the toe and distal hallux no signs of infection no purulence or malodor no cellulitis     Assessment:   1. Skin ulcer of toe of right foot, limited to breakdown of skin ([place])      Plan:  Patient was evaluated and treated and all questions answered.  She has an ulcer of the second toe and great toe.  She has palpable pulses.  She is nondiabetic.  I placed her on doxycycline as a precaution and advised to dress daily with mupirocin ointment which I also prescribed.  She will see me back in 3 weeks for reevaluation hopefully she will heal uneventfully  Return in about 3 weeks (around 11/28/2021) for wound care.

## 2021-11-09 ENCOUNTER — Telehealth: Payer: Self-pay | Admitting: Pharmacist

## 2021-11-09 DIAGNOSIS — M8000XA Age-related osteoporosis with current pathological fracture, unspecified site, initial encounter for fracture: Secondary | ICD-10-CM

## 2021-11-09 NOTE — Progress Notes (Deleted)
Office Visit Note  Patient: [name]             Date of Birth: 1956-01-25           MRN: [id number]             PCP: Rita Ohara, MD Referring: Rita Ohara, MD Visit Date: 11/15/2021 Occupation: @GUAROCC @  Subjective:    History of Present Illness: [name] is a 65 y.o. female with history of gout, osteoarthritis, fibromyalgia, osteoporosis and DDD.  She is taking allopurinol 300 mg 1 tablet by mouth daily.   She is on forteo daily injections for management of osteoporosis.    CBC and CMP updated on 09/14/21.   Uric acid was within the desirable range-5.2 on 08/24/21.  Vitamin D within the desirable range-60 on 07/05/21/  Activities of Daily Living:  Patient reports morning stiffness for *** {minute/hour:19697}.   Patient {ACTIONS;DENIES/REPORTS:21021675::"Denies"} nocturnal pain.  Difficulty dressing/grooming: {ACTIONS;DENIES/REPORTS:21021675::"Denies"} Difficulty climbing stairs: {ACTIONS;DENIES/REPORTS:21021675::"Denies"} Difficulty getting out of chair: {ACTIONS;DENIES/REPORTS:21021675::"Denies"} Difficulty using hands for taps, buttons, cutlery, and/or writing: {ACTIONS;DENIES/REPORTS:21021675::"Denies"}  No Rheumatology ROS completed.   PMFS History:  Patient Active Problem List   Diagnosis Date Noted   Osteopenia 08/04/2021   Acute right-sided weakness 01/22/2021   Peripheral polyneuropathy 01/22/2021   History of migraine headaches 01/22/2021   Difficulty with speech    Gastric bypass status for obesity 09/29/2019   Aftercare 08/12/2018   Pain in left knee 06/03/2018   Dyspnea 12/26/2017   Restrictive lung disease secondary to obesity 12/26/2017   Atypical chest pain 12/06/2017   Sinus tachycardia 12/06/2017   Chest pain 12/06/2017   DJD (degenerative joint disease), cervical 12/24/2016   Primary osteoarthritis of both hips 12/24/2016   Primary osteoarthritis of both knees 12/24/2016   H/O total knee replacement, right 12/24/2016   Spondylosis of  lumbar region without myelopathy or radiculopathy 12/24/2016   Lumbosacral spondylosis without myelopathy 12/24/2016   Acute gout 05/30/2016   Gout 05/30/2016   Myalgia 03/29/2016   Other long term (current) drug therapy 03/29/2016   Idiopathic peripheral neuropathy 03/29/2016   Cannot sleep 03/29/2016   Migraine without aura and responsive to treatment 03/29/2016   Multifocal myoclonus 03/29/2016   Restless leg 03/29/2016   Has a tremor 03/29/2016   History of aspiration pneumonitis 01/25/2016   History of acute bronchitis 01/25/2016   LPRD (laryngopharyngeal reflux disease) 01/25/2016   Imbalance 01/09/2016   Serotonin syndrome 12/22/2015   Essential hypertension 09/10/2015   OA (osteoarthritis) of knee 09/05/2015   Obesity 05/17/2015   OSA (obstructive sleep apnea) 03/16/2013   Iron deficiency 11/06/2012   Thrombocythemia 11/06/2012   Leukocytosis 11/05/2012   Impaired fasting glucose 07/23/2012   Bronchitis 04/03/2012   Kidney stone on left side 03/05/2012   Kidney cysts 03/04/2012   Loose stools 03/04/2012   Urinary frequency 12/04/2011   Restless leg syndrome 12/04/2011   Polypharmacy 12/04/2011   Bladder incontinence 12/04/2011   Fibromyalgia    Mixed hyperlipidemia    Edema    S/P endometrial ablation    Allergic rhinitis 09/18/2011   Depression, major, single episode, in partial remission ([place]) 04/21/2011   PRECORDIAL PAIN 02/17/2010   Anxiety state 01/30/2010   Migraine 01/30/2010   GERD without esophagitis 01/30/2010   Gastroparesis 01/30/2010    Past Medical History:  Diagnosis Date   Anemia    previously followed by Dr. Jamse Arn for anemia and elevated platelets   Anxiety    C. difficile colitis 10/01/2012   treated  by [facility]   Chronic fatigue syndrome    Closed wedge compression fracture of T8 vertebra ([place]) 06/2021   DDD (degenerative disc disease), lumbar 08/19/2014   and facet arthroplasty & left lumbar radiculopathy (Dr.Ramos)   Depression     Dyssynergia    dyssynergenic defecation, contributing to fecal incontinence.   Edema    Fibromyalgia    Gastroparesis    followed at [facility]   GERD (gastroesophageal reflux disease)    History of kidney stones    History of vertebral fracture 06/30/2021   Hyperlipidemia    Kidney stone    Lumbar radiculopathy    Migraine    Neuropathy    Obstructive sleep apnea    Does  wear  CPAP   Paresthesia    Dr. Everette Rank at [facility]   Pelvic floor dysfunction    pelvic floor dyssynergy   Plantar fasciitis 02/2011   R foot   Pneumonia    2012   PONV (postoperative nausea and vomiting)    pt states has gastroparesis has difficulty taking antibiotics and narcotics has severe nausea and vomiting    Restless leg syndrome    S/P endometrial ablation 08/09/2006   Novasure Ablation   S/P epidural steroid injection 09/20/2014   Dr.Ramos   Tremor    Dr. Everette Rank   Urinary frequency    Urinary incontinence     Family History  Problem Relation Age of Onset   Allergies Mother    Hypertension Mother    Heart disease Mother        possible valve problem - leaking valve   Macular degeneration Mother    Heart disease Father        pacemaker, CHF   Hypertension Father    Diabetes Father        borderline   Stroke Father 61   Kidney disease Father    Asthma Sister    Irritable bowel syndrome Sister    Allergies Sister    Heart disease Paternal Grandmother    Heart disease Paternal Grandfather    Cancer Maternal Aunt        leukemia   Cancer Maternal Aunt    Colon cancer Maternal Aunt        late 60's   Heart disease Maternal Grandmother    Heart disease Maternal Grandfather    CAD Neg Hx    Past Surgical History:  Procedure Laterality Date   CHOLECYSTECTOMY  9/05   ENDOMETRIAL ABLATION  08/09/2006   Dr. Valentina Shaggy Ablation   FACET JOINT INJECTION  04/17/2017   Left L4-5 and L5-S1   GASTRIC ROUX-EN-Y N/A 09/29/2019   Procedure: LAPAROSCOPIC ROUX-EN-Y GASTRIC BYPASS  WITH UPPER ENDOSCOPY, ERAS Pathway;  Surgeon: Johnathan Hausen, MD;  Location: [facility];  Service: General;  Laterality: N/A;   [place]   R knee, Dr. Eddie Dibbles, torn cartilage   RETINAL LASER PROCEDURE Right 08/28/2018   laser retinopexy   RIGHT/LEFT HEART CATH AND CORONARY ANGIOGRAPHY N/A 01/01/2018   Procedure: RIGHT/LEFT HEART CATH AND CORONARY ANGIOGRAPHY;  Surgeon: Sherren Mocha, MD;  Location: [place] [facility];  Service: Cardiovascular;  Laterality: N/A;   TONSILLECTOMY  1968   TONSILLECTOMY     TOTAL KNEE ARTHROPLASTY Right 09/05/2015   Procedure: RIGHT TOTAL KNEE ARTHROPLASTY;  Surgeon: Gaynelle Arabian, MD;  Location: [facility];  Service: Orthopedics;  Laterality: Right;   TOTAL KNEE ARTHROPLASTY Left 07/14/2018   Procedure: LEFT TOTAL KNEE ARTHROPLASTY;  Surgeon: Gaynelle Arabian, MD;  Location: [facility];  Service: Orthopedics;  Laterality: Left;   ULTRASOUND GUIDANCE FOR VASCULAR ACCESS  01/01/2018   Procedure: Ultrasound Guidance For Vascular Access;  Surgeon: Sherren Mocha, MD;  Location: [place] [facility];  Service: Cardiovascular;;   UMBILICAL HERNIA REPAIR N/A 09/29/2019   Procedure: HERNIA REPAIR UMBILICAL ADULT;  Surgeon: Johnathan Hausen, MD;  Location: [facility];  Service: General;  Laterality: N/A;   Social History   Social History Narrative   Married, 1 dog. 1 son in [place] (grandson born 06/2017), 1 stepson in [place], with 2 children   Immunization History  Administered Date(s) Administered   Influenza Inj Mdck Quad Pf 09/13/2019   Influenza Split 09/17/2011, 09/03/2012, 09/04/2013   Influenza,inj,Quad PF,6+ Mos 08/16/2014, 07/27/2015, 07/31/2017, 08/13/2018, 07/28/2020   Influenza-Unspecified 10/04/2016, 07/31/2017, 09/23/2017   PFIZER Comirnaty(Gray Top)Covid-19 Tri-Sucrose Vaccine 06/01/2021   PFIZER(Purple Top)SARS-COV-2 Vaccination 02/20/2020, 03/12/2020, 09/20/2020   Tdap 01/16/2011, 06/20/2017   Zoster Recombinat (Shingrix) 02/05/2019,  07/14/2019     Objective: Vital Signs: LMP 07/27/2006    Physical Exam Vitals and nursing note reviewed.  Constitutional:      Appearance: She is well-developed.  HENT:     Head: Normocephalic and atraumatic.  Eyes:     Conjunctiva/sclera: Conjunctivae normal.  Pulmonary:     Effort: Pulmonary effort is normal.  Abdominal:     Palpations: Abdomen is soft.  Musculoskeletal:     Cervical back: Normal range of motion.  Skin:    General: Skin is warm and dry.     Capillary Refill: Capillary refill takes less than 2 seconds.  Neurological:     Mental Status: She is alert and oriented to person, place, and time.  Psychiatric:        Behavior: Behavior normal.     Musculoskeletal Exam: ***  CDAI Exam: CDAI Score: -- Patient Global: --; Provider Global: -- Swollen: --; Tender: -- Joint Exam 11/15/2021   No joint exam has been documented for this visit   There is currently no information documented on the homunculus. Go to the Rheumatology activity and complete the homunculus joint exam.  Investigation: No additional findings.  Imaging: No results found.  Recent Labs: Lab Results  Component Value Date   WBC 8.3 08/24/2021   HGB 12.6 08/24/2021   PLT 384 08/24/2021   NA 140 08/24/2021   K 5.0 08/24/2021   CL 98 08/24/2021   CO2 28 08/24/2021   GLUCOSE 95 08/24/2021   BUN 20 08/24/2021   CREATININE 0.97 08/24/2021   BILITOT 0.2 06/28/2021   ALKPHOS 138 (H) 06/28/2021   AST 30 06/28/2021   ALT 30 06/28/2021   PROT 7.1 07/05/2021   ALBUMIN 3.9 06/28/2021   CALCIUM 8.9 08/24/2021   GFRAA 81 11/07/2020    Speciality Comments: Forteo started 08/07/21 (first dose in clinic)  Procedures:  No procedures performed Allergies: Erythromycin, Tramadol, and Dilaudid [hydromorphone hcl]   Assessment / Plan:     Visit Diagnoses: Idiopathic chronic gout of multiple sites without tophus - allopurinol 300 mg 1 tablet by mouth daily   Medication  management  Fibromyalgia  Other fatigue - armodafinil 250 mg daily with breakfast.   Trapezius muscle spasm - methocarbamol 500 mg 1 tablet by mouth daily as needed for muscle spasms.    Age-related osteoporosis with current pathological fracture, initial encounter - Forteo. DEXA 07/12/2020 reviewed today in the office: Right femoral neck: BMD 0.632 with T score -2.0.  History of vertebral compression fracture -  She has a history of several vertebral fractures.  She had a fall in March 2021 and fractured several vertebrae at that time.    Vitamin D deficiency  Primary osteoarthritis of both hands  Primary osteoarthritis of both hips  History of total knee replacement, bilateral - Performed by Dr. Wynelle Link.  DDD (degenerative disc disease), cervical  DDD (degenerative disc disease), lumbar  History of sleep apnea  History of depression  History of migraine  Orders: No orders of the defined types were placed in this encounter.  No orders of the defined types were placed in this encounter.  Follow-Up Instructions: No follow-ups on file.   Ofilia Neas, PA-C  Note - This record has been created using Dragon software.  Chart creation errors have been sought, but may not always  have been located. Such creation errors do not reflect on  the standard of medical care.

## 2021-11-09 NOTE — Telephone Encounter (Signed)
Received call from patient that she has not received [employer] PAP renewal application for her Forteo  Patient states that she will stop by on Monday, 11/13/21 to complete Forteo application, bring updated income documents, and sign appeal letter. Provider portion already signed with med list, PA approved letter, and insurance card copy.  Documents at my desk at rheum clinic  Knox Saliva, PharmD, MPH, BCPS Clinical Pharmacist (Rheumatology and Pulmonology)

## 2021-11-13 ENCOUNTER — Ambulatory Visit: Payer: Medicare Other

## 2021-11-13 MED ORDER — FORTEO 600 MCG/2.4ML ~~LOC~~ SOPN: 20.0000 ug | PEN_INJECTOR | Freq: Every day | SUBCUTANEOUS | 0 refills | Status: DC

## 2021-11-13 NOTE — Telephone Encounter (Signed)
Patient dropped off updated income documents. Patient signed appeal form in case it is needed. Appeal form and income documents will not be submitted until initial denial is received. Will file these with PAP application in pharmacy office  Submitted Patient Assistance RENEWAL Application to [facility] for [place] along with provider portion, patient portion, insurance card copy, med list, PA. Will update patient when we receive a response.  Fax# [phone] Phone# [phone]  Patient started her last pen on 10/31/21. Provided her with phone number today to schedule shipment - advised that they may not be able to ship her another supply prior to new year  Knox Saliva, PharmD, MPH, BCPS Clinical Pharmacist (Rheumatology and Pulmonology)

## 2021-11-15 ENCOUNTER — Ambulatory Visit: Payer: Medicare Other | Admitting: Physician Assistant

## 2021-11-15 ENCOUNTER — Encounter: Payer: Self-pay | Admitting: *Deleted

## 2021-11-16 ENCOUNTER — Telehealth: Payer: Self-pay | Admitting: Rheumatology

## 2021-11-16 NOTE — Telephone Encounter (Signed)
Called patient again. She states that she received 2 Forteo pens at home. I advised that we will await response from [facility], PharmD, MPH, BCPS Clinical Pharmacist (Rheumatology and Pulmonology)

## 2021-11-16 NOTE — Telephone Encounter (Addendum)
Returned call to patient. Unable to reach. Left VM requesting return call  Knox Saliva, PharmD, MPH, BCPS Clinical Pharmacist (Rheumatology and Pulmonology)

## 2021-11-16 NOTE — Telephone Encounter (Signed)
Patient called the office requesting a call back from [facility]. Patient states she has questions.

## 2021-11-23 ENCOUNTER — Other Ambulatory Visit: Payer: Self-pay | Admitting: Physician Assistant

## 2021-11-23 NOTE — Telephone Encounter (Signed)
Next Visit: message sent to the front desk to schedule an appointment.   Last Visit: 07/05/2021  Last Fill: 10/26/2021  DX:  Other fatigue   Current Dose per office note on 07/05/2021: armodafinil 250 mg daily with breakfast.   Okay to refill armodafinil?

## 2021-11-24 NOTE — Progress Notes (Signed)
Office Visit Note  Patient: [name]             Date of Birth: 1956-02-14           MRN: [id number]             PCP: Rita Ohara, MD Referring: Rita Ohara, MD Visit Date: 12/07/2021 Occupation: @GUAROCC @  Subjective:  Right toe skin ulcer  History of Present Illness: [name] is a 65 y.o. female with history of gout, osteoarthritis, and osteoporosis.  Patient remains on allopurinol 300 mg 1 tablet by mouth daily for management of gout.  She denies any signs or symptoms of a gout flare. She remains on forteo daily injections for management of osteoporosis.  She had a kyphoplasty performed in December 2022 which provided significant relief for the back pain.   She has been experiencing increased pain due to fibromyalgia.  She remains on gabapentin, cymbalta, and robaxin.  She is also taking armodafinil daily to improve her chronic fatigue.  She has had issues getting the prescription refilled due to the pharmacy not having enough of the medication on file. She has been following up closely with Dr. Sherryle Lis for the management of a nonhealing skin ulcer on the right 2nd toe.  She was started on doxycyline on 11/30/21.  She is currently wearing a postop shoe for support.     Activities of Daily Living:  Patient reports morning stiffness for 0  none .   Patient Denies nocturnal pain.  Difficulty dressing/grooming: Denies Difficulty climbing stairs: Reports Difficulty getting out of chair: Denies Difficulty using hands for taps, buttons, cutlery, and/or writing: Denies  Review of Systems  Constitutional:  Positive for fatigue.  HENT:  Positive for mouth dryness.   Eyes:  Positive for dryness.  Respiratory:  Positive for shortness of breath.   Cardiovascular:  Negative for swelling in legs/feet.  Gastrointestinal:  Negative for constipation.  Endocrine: Positive for heat intolerance and excessive thirst.  Genitourinary:  Negative for difficulty urinating.  Musculoskeletal:   Positive for gait problem and muscle weakness.  Skin:  Negative for rash.  Allergic/Immunologic: Negative for susceptible to infections.  Neurological:  Negative for numbness.  Hematological:  Negative for bruising/bleeding tendency.  Psychiatric/Behavioral:  Positive for sleep disturbance.    PMFS History:  Patient Active Problem List   Diagnosis Date Noted   Osteopenia 08/04/2021   Acute right-sided weakness 01/22/2021   Peripheral polyneuropathy 01/22/2021   History of migraine headaches 01/22/2021   Difficulty with speech    Gastric bypass status for obesity 09/29/2019   Aftercare 08/12/2018   Pain in left knee 06/03/2018   Dyspnea 12/26/2017   Restrictive lung disease secondary to obesity 12/26/2017   Atypical chest pain 12/06/2017   Sinus tachycardia 12/06/2017   Chest pain 12/06/2017   DJD (degenerative joint disease), cervical 12/24/2016   Primary osteoarthritis of both hips 12/24/2016   Primary osteoarthritis of both knees 12/24/2016   H/O total knee replacement, right 12/24/2016   Spondylosis of lumbar region without myelopathy or radiculopathy 12/24/2016   Lumbosacral spondylosis without myelopathy 12/24/2016   Acute gout 05/30/2016   Gout 05/30/2016   Myalgia 03/29/2016   Other long term (current) drug therapy 03/29/2016   Idiopathic peripheral neuropathy 03/29/2016   Cannot sleep 03/29/2016   Migraine without aura and responsive to treatment 03/29/2016   Multifocal myoclonus 03/29/2016   Restless leg 03/29/2016   Has a tremor 03/29/2016   History of aspiration pneumonitis 01/25/2016   History  of acute bronchitis 01/25/2016   LPRD (laryngopharyngeal reflux disease) 01/25/2016   Imbalance 01/09/2016   Serotonin syndrome 12/22/2015   Essential hypertension 09/10/2015   OA (osteoarthritis) of knee 09/05/2015   Obesity 05/17/2015   OSA (obstructive sleep apnea) 03/16/2013   Iron deficiency 11/06/2012   Thrombocythemia 11/06/2012   Leukocytosis 11/05/2012    Impaired fasting glucose 07/23/2012   Bronchitis 04/03/2012   Kidney stone on left side 03/05/2012   Kidney cysts 03/04/2012   Loose stools 03/04/2012   Urinary frequency 12/04/2011   Restless leg syndrome 12/04/2011   Polypharmacy 12/04/2011   Bladder incontinence 12/04/2011   Fibromyalgia    Mixed hyperlipidemia    Edema    S/P endometrial ablation    Allergic rhinitis 09/18/2011   Depression, major, single episode, in partial remission ([place]) 04/21/2011   PRECORDIAL PAIN 02/17/2010   Anxiety state 01/30/2010   Migraine 01/30/2010   GERD without esophagitis 01/30/2010   Gastroparesis 01/30/2010    Past Medical History:  Diagnosis Date   Anemia    previously followed by Dr. Jamse Arn for anemia and elevated platelets   Anxiety    C. difficile colitis 10/01/2012   treated by [facility]   Chronic fatigue syndrome    Closed wedge compression fracture of T8 vertebra ([place]) 06/2021   DDD (degenerative disc disease), lumbar 08/19/2014   and facet arthroplasty & left lumbar radiculopathy (Dr.Ramos)   Depression    Dyssynergia    dyssynergenic defecation, contributing to fecal incontinence.   Edema    Fibromyalgia    Gastroparesis    followed at [facility]   GERD (gastroesophageal reflux disease)    History of kidney stones    History of vertebral fracture 06/30/2021   Hyperlipidemia    Kidney stone    Lumbar radiculopathy    Migraine    Neuropathy    Obstructive sleep apnea    Does  wear  CPAP   Paresthesia    Dr. Everette Rank at [facility]   Pelvic floor dysfunction    pelvic floor dyssynergy   Plantar fasciitis 02/2011   R foot   Pneumonia    2012   PONV (postoperative nausea and vomiting)    pt states has gastroparesis has difficulty taking antibiotics and narcotics has severe nausea and vomiting    Restless leg syndrome    S/P endometrial ablation 08/09/2006   Novasure Ablation   S/P epidural steroid injection 09/20/2014   Dr.Ramos   Tremor    Dr. Everette Rank   Urinary  frequency    Urinary incontinence     Family History  Problem Relation Age of Onset   Allergies Mother    Hypertension Mother    Heart disease Mother        possible valve problem - leaking valve   Macular degeneration Mother    Heart disease Father        pacemaker, CHF   Hypertension Father    Diabetes Father        borderline   Stroke Father 34   Kidney disease Father    Asthma Sister    Irritable bowel syndrome Sister    Allergies Sister    Heart disease Paternal Grandmother    Heart disease Paternal Grandfather    Cancer Maternal Aunt        leukemia   Cancer Maternal Aunt    Colon cancer Maternal Aunt        late 60's   Heart disease Maternal Grandmother    Heart  disease Maternal Grandfather    CAD Neg Hx    Past Surgical History:  Procedure Laterality Date   CHOLECYSTECTOMY  07/2004   ENDOMETRIAL ABLATION  08/09/2006   Dr. Valentina Shaggy Ablation   FACET JOINT INJECTION  04/17/2017   Left L4-5 and L5-S1   GASTRIC ROUX-EN-Y N/A 09/29/2019   Procedure: LAPAROSCOPIC ROUX-EN-Y GASTRIC BYPASS WITH UPPER ENDOSCOPY, ERAS Pathway;  Surgeon: Johnathan Hausen, MD;  Location: [facility];  Service: General;  Laterality: N/A;   [place]   R knee, Dr. Eddie Dibbles, torn cartilage   KYPHOPLASTY  11/06/2021   T8-T9-Nundkumar   RETINAL LASER PROCEDURE Right 08/28/2018   laser retinopexy   RIGHT/LEFT HEART CATH AND CORONARY ANGIOGRAPHY N/A 01/01/2018   Procedure: RIGHT/LEFT HEART CATH AND CORONARY ANGIOGRAPHY;  Surgeon: Sherren Mocha, MD;  Location: [place] [facility];  Service: Cardiovascular;  Laterality: N/A;   TONSILLECTOMY  1968   TONSILLECTOMY     TOTAL KNEE ARTHROPLASTY Right 09/05/2015   Procedure: RIGHT TOTAL KNEE ARTHROPLASTY;  Surgeon: Gaynelle Arabian, MD;  Location: [facility];  Service: Orthopedics;  Laterality: Right;   TOTAL KNEE ARTHROPLASTY Left 07/14/2018   Procedure: LEFT TOTAL KNEE ARTHROPLASTY;  Surgeon: Gaynelle Arabian, MD;  Location:  [facility];  Service: Orthopedics;  Laterality: Left;   ULTRASOUND GUIDANCE FOR VASCULAR ACCESS  01/01/2018   Procedure: Ultrasound Guidance For Vascular Access;  Surgeon: Sherren Mocha, MD;  Location: [place] [facility];  Service: Cardiovascular;;   UMBILICAL HERNIA REPAIR N/A 09/29/2019   Procedure: HERNIA REPAIR UMBILICAL ADULT;  Surgeon: Johnathan Hausen, MD;  Location: [facility];  Service: General;  Laterality: N/A;   Social History   Social History Narrative   Married, 1 dog. 1 son in [place] (grandson born 06/2017), 1 stepson in [place], with 2 children   Immunization History  Administered Date(s) Administered   Influenza Inj Mdck Quad Pf 09/13/2019   Influenza Split 09/17/2011, 09/03/2012, 09/04/2013   Influenza,inj,Quad PF,6+ Mos 08/16/2014, 07/27/2015, 07/31/2017, 08/13/2018, 07/28/2020   Influenza-Unspecified 10/04/2016, 07/31/2017, 09/23/2017   PFIZER Comirnaty(Gray Top)Covid-19 Tri-Sucrose Vaccine 06/01/2021   PFIZER(Purple Top)SARS-COV-2 Vaccination 02/20/2020, 03/12/2020, 09/20/2020   Tdap 01/16/2011, 06/20/2017   Zoster Recombinat (Shingrix) 02/05/2019, 07/14/2019     Objective: Vital Signs: BP 133/81 (BP Location: Left Arm, Patient Position: Sitting, Cuff Size: Normal)    Pulse 98    Resp 17    Ht 5' 2.5" (1.588 m)    Wt 188 lb (85.3 kg)    LMP 07/27/2006    BMI 33.84 kg/m    Physical Exam Vitals and nursing note reviewed.  Constitutional:      Appearance: She is well-developed.  HENT:     Head: Normocephalic and atraumatic.  Eyes:     Conjunctiva/sclera: Conjunctivae normal.  Pulmonary:     Effort: Pulmonary effort is normal.  Abdominal:     Palpations: Abdomen is soft.  Musculoskeletal:     Cervical back: Normal range of motion.  Skin:    General: Skin is warm and dry.     Capillary Refill: Capillary refill takes less than 2 seconds.  Neurological:     Mental Status: She is alert and oriented to person, place, and time.  Psychiatric:        Behavior: Behavior  normal.     Musculoskeletal Exam: C-spine has limited ROM with lateral rotation.  Trapezius muscle tension and tenderness bilaterally.  Shoulder joints, elbow joints, wrist joints, MCPs, PIPs, and DIPs good ROM with no synovitis.  Complete fist formation bilaterally.  Hip joints, knee joints, and ankle joints have good ROM.  No warmth or effusion of knee joints.  No tenderness or swelling of ankle joints.   CDAI Exam: CDAI Score: -- Patient Global: --; Provider Global: -- Swollen: --; Tender: -- Joint Exam 12/07/2021   No joint exam has been documented for this visit   There is currently no information documented on the homunculus. Go to the Rheumatology activity and complete the homunculus joint exam.  Investigation: No additional findings.  Imaging: DG Foot Complete Right  Result Date: 11/30/2021 Please see detailed radiograph report in office note.   Recent Labs: Lab Results  Component Value Date   WBC 8.8 11/30/2021   HGB 12.4 11/30/2021   PLT 345 11/30/2021   NA 140 08/24/2021   K 5.0 08/24/2021   CL 98 08/24/2021   CO2 28 08/24/2021   GLUCOSE 95 08/24/2021   BUN 20 08/24/2021   CREATININE 0.97 08/24/2021   BILITOT 0.2 06/28/2021   ALKPHOS 138 (H) 06/28/2021   AST 30 06/28/2021   ALT 30 06/28/2021   PROT 7.1 07/05/2021   ALBUMIN 3.9 06/28/2021   CALCIUM 8.9 08/24/2021   GFRAA 81 11/07/2020    Speciality Comments: Forteo started 08/07/21 (first dose in clinic)  Procedures:  No procedures performed Allergies: Erythromycin, Meperidine hcl, Polyethyl glyc-propyl glyc pf, Tramadol, and Dilaudid [hydromorphone hcl]     Assessment / Plan:     Visit Diagnoses: Idiopathic chronic gout of multiple sites without tophus - She has not had any signs or symptoms of a gout flare.  She is clinically doing well taking allopurinol 300 mg 1 tablet by mouth daily.  She is tolerating allopurinol without any side effects and has not missed any doses recently.  Her uric acid was  within the desirable range 4.5 on 11/30/2021.  She will remain on the current dose of allopurinol as prescribed by Dr. Tomi Bamberger.   Medication management: Uric acid was within the desirable range-4.5 on 11/30/21.   Fibromyalgia: She continues to experience intermittent myalgias and muscle tenderness due to fibromyalgia.  She is having trapezius muscle tension and tenderness bilaterally.  She has been having more frequent and severe fibromyalgia flares.  She continues to take armodafinil daily for management of chronic fatigue.  She remains on methocarbamol 500 mg daily as needed for muscle spasms, Cymbalta 60 mg 2 times daily, and gabapentin 300 mg 3 times daily.  Offered her referral to pain management but she declined at this time.  Discussed the importance of good sleep hygiene.  She has had difficulty exercising due to the nonhealing ulcer on her right second toe.  Other fatigue -She takes armodafinil 250 mg daily with breakfast.  She has had difficulty getting the prescription refilled due to the pharmacy having an inadequate supply on hand per patient.  I will checked with Knox Saliva see if the prescription can be transferred to [facility] to be filled in the future.  Trapezius muscle spasm - She experiences intermittent trapezius muscle tension and tenderness bilaterally.  She has muscle spasms on occasion.  She has been taking methocarbamol 500 mg 1 tablet by mouth daily as needed for muscle spasms.    Age-related osteoporosis with current pathological fracture, initial encounter -DEXA 07/12/2020 reviewed today in the office: Right femoral neck: BMD 0.632 with T score -2.0.  History of vertebral compression fractures.  Most recent kyphoplasty performed in December 2022.  She is currently on Forteo daily injections as  prescribed.  She initiated Forteo on 08/07/2021.  She is also taking a calcium supplement daily.  Her vitamin D level was 60 on 07/05/2021.  Discussed the importance of performing resistive  exercises.  She will be due to update her bone density in August 2023.  History of vertebral compression fracture - She has a history of several vertebral fractures.  She had a fall in March 2021 and fractured several vertebrae at that time.  Kyphoplasty performed December 2022.  She remains on Forteo as prescribed.  Vitamin D deficiency: Vitamin D was 60 on 07/05/2021.  Primary osteoarthritis of both hands: She has PIP and DIP thickening consistent with osteoarthritis of both hands.  No tenderness or inflammation was noted on examination.  Complete fist formation bilaterally.  Primary osteoarthritis of both hips: She has good range of motion of both hip joints with no discomfort at this time.  No groin pain currently.  History of total knee replacement, bilateral: She has good range of motion of both knee replacements on examination today.  DDD (degenerative disc disease), cervical: Limited range of motion of the C-spine with lateral rotation.  DDD (degenerative disc disease), lumbar - She has been following up with Dr. Maxie [facility] at [facility].  Skin ulcer of right foot including toes with fat layer exposed [facility]): Patient has been following up closely with Dr. Sherryle Lis.  Her most recent x-ray was performed on 11/30/2021.  At her most recent office visit on 11/30/2021 she was started on a course of doxycycline.  She continues to wear a postop shoe for support.  Other medical conditions are listed as follows:  History of sleep apnea  History of depression  History of migraine  Orders: No orders of the defined types were placed in this encounter.  No orders of the defined types were placed in this encounter.    Follow-Up Instructions: Return in about 6 months (around 06/06/2022) for Gout, Osteoarthritis, Osteoporosis.   Ofilia Neas, PA-C  Note - This record has been created using Dragon software.  Chart creation errors have been sought, but may not always  have been located. Such  creation errors do not reflect on  the standard of medical care.

## 2021-11-29 ENCOUNTER — Other Ambulatory Visit: Payer: Self-pay | Admitting: Rheumatology

## 2021-11-29 MED ORDER — ARMODAFINIL 250 MG PO TABS: 250.0000 mg | ORAL_TABLET | Freq: Every day | ORAL | 0 refills | Status: DC

## 2021-11-29 NOTE — Telephone Encounter (Signed)
Patient called the office stating the [facility] [address] is unable to be filled. The pharmacy told her that they do not have the medication in and won't have it back for a while. The pharmacy told her it was in stock [facility] [address] but they would need a new prescription. Armondafinil 250mg 

## 2021-11-29 NOTE — Telephone Encounter (Signed)
Next Visit: 12/07/2021   Last Visit: 07/05/2021   DX:  Other fatigue    Current Dose per office note on 07/05/2021: armodafinil 250 mg daily with breakfast.   Patient unable to have prescription filled at her normal pharmacy. [facility] [address]    Okay to refill armodafinil?

## 2021-11-30 ENCOUNTER — Ambulatory Visit (INDEPENDENT_AMBULATORY_CARE_PROVIDER_SITE_OTHER): Payer: Medicare Other

## 2021-11-30 ENCOUNTER — Ambulatory Visit: Payer: Medicare Other | Admitting: Podiatry

## 2021-11-30 ENCOUNTER — Other Ambulatory Visit: Payer: Self-pay

## 2021-11-30 DIAGNOSIS — M7989 Other specified soft tissue disorders: Secondary | ICD-10-CM

## 2021-11-30 DIAGNOSIS — L97512 Non-pressure chronic ulcer of other part of right foot with fat layer exposed: Secondary | ICD-10-CM

## 2021-11-30 MED ORDER — DOXYCYCLINE HYCLATE 100 MG PO TABS: 100.0000 mg | ORAL_TABLET | Freq: Two times a day (BID) | ORAL | 0 refills | Status: DC

## 2021-12-01 LAB — CBC WITH DIFFERENTIAL/PLATELET
Basophils Absolute: 0.1 10*3/uL (ref 0.0–0.2)
Basos: 1 %
EOS (ABSOLUTE): 0.2 10*3/uL (ref 0.0–0.4)
Eos: 2 %
Hematocrit: 37.6 % (ref 34.0–46.6)
Hemoglobin: 12.4 g/dL (ref 11.1–15.9)
Immature Grans (Abs): 0 10*3/uL (ref 0.0–0.1)
Immature Granulocytes: 1 %
Lymphocytes Absolute: 2.9 10*3/uL (ref 0.7–3.1)
Lymphs: 33 %
MCH: 30.6 pg (ref 26.6–33.0)
MCHC: 33 g/dL (ref 31.5–35.7)
MCV: 93 fL (ref 79–97)
Monocytes Absolute: 0.9 10*3/uL (ref 0.1–0.9)
Monocytes: 10 %
Neutrophils Absolute: 4.8 10*3/uL (ref 1.4–7.0)
Neutrophils: 53 %
Platelets: 345 10*3/uL (ref 150–450)
RBC: 4.05 x10E6/uL (ref 3.77–5.28)
RDW: 13 % (ref 11.7–15.4)
WBC: 8.8 10*3/uL (ref 3.4–10.8)

## 2021-12-01 LAB — SEDIMENTATION RATE: Sed Rate: 55 mm/hr — ABNORMAL HIGH (ref 0–40)

## 2021-12-01 LAB — URIC ACID: Uric Acid: 4.5 mg/dL (ref 3.0–7.2)

## 2021-12-01 LAB — RHEUMATOID FACTOR

## 2021-12-04 ENCOUNTER — Encounter: Payer: Self-pay | Admitting: Podiatry

## 2021-12-04 NOTE — Progress Notes (Signed)
Subjective:  °Patient ID: [name], female    DOB: 06/06/1956,  MRN: [id number] ° °Chief Complaint  °Patient presents with  ° Foot Ulcer  °  3 week follow up right foot  ° ° °65 y.o. female presents with the above complaint. History confirmed with patient.  Second toe wound continues to worsen °Objective:  °Physical Exam: °warm, good capillary refill, no trophic changes or ulcerative lesions, normal DP and PT pulses, and normal sensory exam. ° °Right Foot: Ulceration of the second toe has progressed to full-thickness ulceration through the dermis to subcutaneous tissue ° ° ° ° °Radiographs show no definite erosions at the tip of the second toe but likely fractures in the third and fourth toes °Assessment:  ° °1. Skin ulcer of toe of right foot with fat layer exposed (HCC)   °2. Swelling of toe of right foot   ° ° ° °Plan:  °Patient was evaluated and treated and all questions answered. ° °Second toe continues to worsen.  I am placing her on doxycycline.  She had some new edema.  Rx for mupirocin ointment daily changes sent to her pharmacy as well.  She had new what appeared to be fractures of the third and fourth toes that are not particularly painful it is a new finding since 08/24/2021 when I last took a radiograph with her.  Does not have any wound in this area so I am doubtful this is osteomyelitis.  I am checking lab work to evaluate for the presence for this including CBC ESR as well as uric acid and rheumatoid factor.  I will see her back in 2 weeks for reevaluation ° °Return in about 2 weeks (around 12/14/2021) for wound care.  ° ° °

## 2021-12-05 ENCOUNTER — Ambulatory Visit: Payer: Medicare Other | Admitting: Nurse Practitioner

## 2021-12-06 NOTE — Telephone Encounter (Signed)
Received fax from [facility] for [name] patient assistance, patient's application has been DENIED due to exceeding income financial threshold for program.  Appeal letter was signed by patient and she dropped off additional income documents. Submitted appeal today.    Phone: [phone] Fax: [phone]  Knox Saliva, PharmD, MPH, BCPS Clinical Pharmacist (Rheumatology and Pulmonology)

## 2021-12-07 ENCOUNTER — Encounter: Payer: Self-pay | Admitting: Physician Assistant

## 2021-12-07 ENCOUNTER — Other Ambulatory Visit: Payer: Self-pay

## 2021-12-07 ENCOUNTER — Other Ambulatory Visit: Payer: Self-pay | Admitting: Podiatry

## 2021-12-07 ENCOUNTER — Ambulatory Visit: Payer: Medicare Other | Admitting: Physician Assistant

## 2021-12-07 VITALS — BP 133/81 | HR 98 | Resp 17 | Ht 62.5 in | Wt 188.0 lb

## 2021-12-07 DIAGNOSIS — M797 Fibromyalgia: Secondary | ICD-10-CM | POA: Diagnosis not present

## 2021-12-07 DIAGNOSIS — M62838 Other muscle spasm: Secondary | ICD-10-CM

## 2021-12-07 DIAGNOSIS — M1A09X0 Idiopathic chronic gout, multiple sites, without tophus (tophi): Secondary | ICD-10-CM | POA: Diagnosis not present

## 2021-12-07 DIAGNOSIS — L97512 Non-pressure chronic ulcer of other part of right foot with fat layer exposed: Secondary | ICD-10-CM

## 2021-12-07 DIAGNOSIS — M5030 Other cervical disc degeneration, unspecified cervical region: Secondary | ICD-10-CM

## 2021-12-07 DIAGNOSIS — E559 Vitamin D deficiency, unspecified: Secondary | ICD-10-CM | POA: Diagnosis not present

## 2021-12-07 DIAGNOSIS — Z8781 Personal history of (healed) traumatic fracture: Secondary | ICD-10-CM | POA: Diagnosis not present

## 2021-12-07 DIAGNOSIS — M19041 Primary osteoarthritis, right hand: Secondary | ICD-10-CM | POA: Diagnosis not present

## 2021-12-07 DIAGNOSIS — M160 Bilateral primary osteoarthritis of hip: Secondary | ICD-10-CM

## 2021-12-07 DIAGNOSIS — Z8659 Personal history of other mental and behavioral disorders: Secondary | ICD-10-CM

## 2021-12-07 DIAGNOSIS — M8000XA Age-related osteoporosis with current pathological fracture, unspecified site, initial encounter for fracture: Secondary | ICD-10-CM

## 2021-12-07 DIAGNOSIS — M51369 Other intervertebral disc degeneration, lumbar region without mention of lumbar back pain or lower extremity pain: Secondary | ICD-10-CM

## 2021-12-07 DIAGNOSIS — Z96653 Presence of artificial knee joint, bilateral: Secondary | ICD-10-CM

## 2021-12-07 DIAGNOSIS — R5383 Other fatigue: Secondary | ICD-10-CM

## 2021-12-07 DIAGNOSIS — Z79899 Other long term (current) drug therapy: Secondary | ICD-10-CM | POA: Diagnosis not present

## 2021-12-07 DIAGNOSIS — Z8669 Personal history of other diseases of the nervous system and sense organs: Secondary | ICD-10-CM

## 2021-12-07 DIAGNOSIS — M19042 Primary osteoarthritis, left hand: Secondary | ICD-10-CM

## 2021-12-07 DIAGNOSIS — M5136 Other intervertebral disc degeneration, lumbar region: Secondary | ICD-10-CM

## 2021-12-08 ENCOUNTER — Other Ambulatory Visit (HOSPITAL_COMMUNITY): Payer: Self-pay

## 2021-12-08 ENCOUNTER — Other Ambulatory Visit: Payer: Self-pay

## 2021-12-08 LAB — RHEUMATOID FACTOR: Rheumatoid fact SerPl-aCnc: <10 IU/mL (ref <14.0)

## 2021-12-08 MED ORDER — ARMODAFINIL 250 MG PO TABS
250.0000 mg | ORAL_TABLET | Freq: Every day | ORAL | 0 refills | Status: DC
  Filled 2021-12-08: qty 30, 30d supply, fill #0

## 2021-12-08 NOTE — Progress Notes (Unsigned)
Next Visit: 06/06/2022  Last Visit: 12/07/2021  Last Fill:   DX: Other fatigue  Current Dose per office note on 12/07/2021: armodafinil 250 mg daily with breakfast.  Okay to refill nuvigil?   Please send prescription to [facility]. I have called the pharmacy and they have no issue obtaining the medication. I have called and spoke with patient's husband (who is listed on DPR) and explained the situation to him of sending to [facility]. He verbalized understanding and was very Patent attorney.

## 2021-12-12 ENCOUNTER — Ambulatory Visit: Payer: Medicare Other | Admitting: Podiatry

## 2021-12-12 ENCOUNTER — Other Ambulatory Visit: Payer: Self-pay

## 2021-12-12 ENCOUNTER — Other Ambulatory Visit (HOSPITAL_COMMUNITY): Payer: Self-pay

## 2021-12-12 DIAGNOSIS — L97512 Non-pressure chronic ulcer of other part of right foot with fat layer exposed: Secondary | ICD-10-CM

## 2021-12-12 NOTE — Progress Notes (Signed)
Subjective:  Patient ID: [name], female    DOB: [birth year],  MRN: [id number]  Chief Complaint  Patient presents with   Foot Ulcer    2WK WOUND CARE    66 y.o. female presents with the above complaint. History confirmed with patient.  She completed the antibiotic she has been changing the dressing with the mupirocin ointment  Objective:  Physical Exam: warm, good capillary refill, no trophic changes or ulcerative lesions, normal DP and PT pulses, and normal sensory exam.  Right Foot: Full-thickness ulcer right plantar second toe no signs of infection purulent drainage or cellulitis has had quite a bit of improvement measures 0.4 x 0.5 x 0.1 cm      Radiographs show no definite erosions at the tip of the second toe but likely fractures in the third and fourth toes  Lab work was normal except for ESR which is slightly elevated at 55 Assessment:   1. Skin ulcer of toe of right foot with fat layer exposed ([place])      Plan:  Patient was evaluated and treated and all questions answered.  She is had quite a bit of improvement.  I debrided the wound of all nonviable tissue today in an excisional manner to subcutaneous tissue.  Postdebridement measurements as above.  Continue mupirocin ointment.  We discussed proceeding with IPJ fusion of the arthritis of the hallux but would like to wait until this wound is closed at this point.  Return in about 3 weeks (around 01/02/2022) for wound care.

## 2021-12-13 ENCOUNTER — Telehealth: Payer: Self-pay

## 2021-12-13 ENCOUNTER — Other Ambulatory Visit: Payer: Self-pay

## 2021-12-13 ENCOUNTER — Other Ambulatory Visit: Payer: Self-pay | Admitting: Rheumatology

## 2021-12-13 ENCOUNTER — Other Ambulatory Visit (HOSPITAL_COMMUNITY): Payer: Self-pay

## 2021-12-13 MED ORDER — ARMODAFINIL 250 MG PO TABS: 250.0000 mg | ORAL_TABLET | Freq: Every day | ORAL | 0 refills | Status: DC

## 2021-12-13 NOTE — Telephone Encounter (Signed)
Patient left a voicemail checking the status of her prescription refill of Armodafinil.  Patient states she only has a couple of pills remaining.

## 2021-12-13 NOTE — Telephone Encounter (Signed)
Patient advised [facility] [address] Patient states she has been having trouble getting her prescription refilled because they are not having enough to fill the full prescription. She has had to get a 7 day supply and then a 10 day supply. Patient advised to contact the office if she is unable to get the full [facility] and we will work on sending it elsewhere.

## 2021-12-13 NOTE — Telephone Encounter (Signed)
Pt called into the office stating that she wanted her medication sent in to [facility] [address]. [facility] to [facility].   Please advise

## 2021-12-14 ENCOUNTER — Ambulatory Visit: Payer: Medicare Other

## 2021-12-14 ENCOUNTER — Other Ambulatory Visit (HOSPITAL_COMMUNITY): Payer: Self-pay

## 2021-12-14 ENCOUNTER — Other Ambulatory Visit: Payer: Self-pay | Admitting: Rheumatology

## 2021-12-14 ENCOUNTER — Telehealth: Payer: Self-pay

## 2021-12-14 MED ORDER — ARMODAFINIL 250 MG PO TABS
250.0000 mg | ORAL_TABLET | Freq: Every day | ORAL | 0 refills | Status: DC
  Filled 2021-12-14: qty 30, 30d supply, fill #0

## 2021-12-14 MED ORDER — ARMODAFINIL 250 MG PO TABS: 250.0000 mg | ORAL_TABLET | Freq: Every day | ORAL | 0 refills | Status: DC

## 2021-12-14 NOTE — Addendum Note (Signed)
Addended by: Cassandria Anger on: 12/14/2021 12:18 PM   Modules accepted: Orders

## 2021-12-14 NOTE — Telephone Encounter (Signed)
Rx is re-printing because Dr. Estanislado Pandy has to be one to sign rx, routing to Dr. Estanislado Pandy to re-order armodafinil to [facility]

## 2021-12-14 NOTE — Telephone Encounter (Signed)
Rx sent to [facility]. Patient aware that [facility] also has limited stock  Knox Saliva, PharmD, MPH, BCPS Clinical Pharmacist (Rheumatology and Pulmonology)

## 2021-12-14 NOTE — Addendum Note (Signed)
Addended by: Cassandria Anger on: 12/14/2021 12:09 PM   Modules accepted: Orders

## 2021-12-14 NOTE — Addendum Note (Signed)
Addended by: Cassandria Anger on: 12/14/2021 12:46 PM   Modules accepted: Orders

## 2021-12-14 NOTE — Telephone Encounter (Signed)
[facility] and [facility] prescription that was sent in yesterday. Prescription is being sent to [facility] outpatient by [facility].

## 2021-12-15 ENCOUNTER — Other Ambulatory Visit (HOSPITAL_COMMUNITY): Payer: Self-pay

## 2021-12-18 ENCOUNTER — Other Ambulatory Visit (HOSPITAL_COMMUNITY): Payer: Self-pay

## 2021-12-18 MED ORDER — ARMODAFINIL 250 MG PO TABS
ORAL_TABLET | ORAL | 0 refills | Status: DC
  Filled 2021-12-18 – 2022-01-15 (×2): qty 30, 30d supply, fill #0

## 2021-12-19 DIAGNOSIS — Z79899 Other long term (current) drug therapy: Secondary | ICD-10-CM | POA: Diagnosis not present

## 2021-12-19 DIAGNOSIS — D3132 Benign neoplasm of left choroid: Secondary | ICD-10-CM | POA: Diagnosis not present

## 2021-12-19 DIAGNOSIS — H2513 Age-related nuclear cataract, bilateral: Secondary | ICD-10-CM | POA: Diagnosis not present

## 2021-12-19 DIAGNOSIS — H353131 Nonexudative age-related macular degeneration, bilateral, early dry stage: Secondary | ICD-10-CM | POA: Diagnosis not present

## 2021-12-19 DIAGNOSIS — H04123 Dry eye syndrome of bilateral lacrimal glands: Secondary | ICD-10-CM | POA: Diagnosis not present

## 2021-12-20 ENCOUNTER — Other Ambulatory Visit: Payer: Self-pay | Admitting: Family Medicine

## 2021-12-21 NOTE — Telephone Encounter (Signed)
Refaxed entire Forteo PAP reconsideration application with denial letter, application, and income documents.  Phone: [phone] Fax: [phone]  Knox Saliva, PharmD, MPH, BCPS Clinical Pharmacist (Rheumatology and Pulmonology)

## 2021-12-21 NOTE — Telephone Encounter (Addendum)
Called [employer] for Forteo PAP renewal status. Per rep, they received appeal but need application and denial letter to be faxed with appeal documents. ALL documents must be faxed together (per rep, even the 50-60 pages of income proof docs)  Requested denial letter be re-faxed to clinic  Phone: [phone]  Knox Saliva, PharmD, MPH, BCPS Clinical Pharmacist (Rheumatology and Pulmonology)

## 2021-12-25 ENCOUNTER — Ambulatory Visit
Admission: RE | Admit: 2021-12-25 | Discharge: 2021-12-25 | Disposition: A | Discharge status: Home / Self Care | Payer: Medicare Other | Source: Ambulatory Visit | Attending: Family Medicine | Admitting: Family Medicine

## 2021-12-25 ENCOUNTER — Other Ambulatory Visit: Payer: Self-pay

## 2021-12-25 DIAGNOSIS — Z1231 Encounter for screening mammogram for malignant neoplasm of breast: Secondary | ICD-10-CM

## 2021-12-29 NOTE — Telephone Encounter (Signed)
Called [employer] regarding Forteo reconsideration application. Per rep, the denial and application DID NOT need to be faxed on 12/21/21. She states she must send email to management for reconsideration. I requested this request be expedited because patient is both out of medication AND rep provided completely incorrect information which delayed processing of application by an additional week.  Phone: [phone]  Knox Saliva, PharmD, MPH, BCPS Clinical Pharmacist (Rheumatology and Pulmonology)

## 2021-12-31 ENCOUNTER — Other Ambulatory Visit: Payer: Self-pay

## 2021-12-31 ENCOUNTER — Encounter (HOSPITAL_BASED_OUTPATIENT_CLINIC_OR_DEPARTMENT_OTHER): Payer: Self-pay | Admitting: *Deleted

## 2021-12-31 ENCOUNTER — Emergency Department (HOSPITAL_BASED_OUTPATIENT_CLINIC_OR_DEPARTMENT_OTHER): Payer: Medicare Other | Admitting: Radiology

## 2021-12-31 ENCOUNTER — Emergency Department (HOSPITAL_BASED_OUTPATIENT_CLINIC_OR_DEPARTMENT_OTHER): Payer: Medicare Other

## 2021-12-31 ENCOUNTER — Emergency Department (HOSPITAL_BASED_OUTPATIENT_CLINIC_OR_DEPARTMENT_OTHER)
Admission: EM | Admit: 2021-12-31 | Discharge: 2021-12-31 | Disposition: A | Discharge status: Home / Self Care | Payer: Medicare Other | Attending: Emergency Medicine | Admitting: Emergency Medicine

## 2021-12-31 DIAGNOSIS — Z043 Encounter for examination and observation following other accident: Secondary | ICD-10-CM | POA: Diagnosis not present

## 2021-12-31 DIAGNOSIS — M545 Low back pain, unspecified: Secondary | ICD-10-CM | POA: Diagnosis not present

## 2021-12-31 DIAGNOSIS — M546 Pain in thoracic spine: Secondary | ICD-10-CM | POA: Insufficient documentation

## 2021-12-31 DIAGNOSIS — W06XXXA Fall from bed, initial encounter: Secondary | ICD-10-CM | POA: Diagnosis not present

## 2021-12-31 DIAGNOSIS — S0990XA Unspecified injury of head, initial encounter: Secondary | ICD-10-CM | POA: Diagnosis not present

## 2021-12-31 DIAGNOSIS — M19072 Primary osteoarthritis, left ankle and foot: Secondary | ICD-10-CM | POA: Diagnosis not present

## 2021-12-31 DIAGNOSIS — Z7982 Long term (current) use of aspirin: Secondary | ICD-10-CM | POA: Insufficient documentation

## 2021-12-31 DIAGNOSIS — Z471 Aftercare following joint replacement surgery: Secondary | ICD-10-CM | POA: Diagnosis not present

## 2021-12-31 DIAGNOSIS — R519 Headache, unspecified: Secondary | ICD-10-CM | POA: Diagnosis not present

## 2021-12-31 DIAGNOSIS — S32010A Wedge compression fracture of first lumbar vertebra, initial encounter for closed fracture: Secondary | ICD-10-CM | POA: Diagnosis not present

## 2021-12-31 DIAGNOSIS — S8012XA Contusion of left lower leg, initial encounter: Secondary | ICD-10-CM | POA: Insufficient documentation

## 2021-12-31 DIAGNOSIS — Z96652 Presence of left artificial knee joint: Secondary | ICD-10-CM | POA: Diagnosis not present

## 2021-12-31 DIAGNOSIS — S8992XA Unspecified injury of left lower leg, initial encounter: Secondary | ICD-10-CM | POA: Diagnosis present

## 2021-12-31 DIAGNOSIS — M47812 Spondylosis without myelopathy or radiculopathy, cervical region: Secondary | ICD-10-CM | POA: Diagnosis not present

## 2021-12-31 DIAGNOSIS — S22080A Wedge compression fracture of T11-T12 vertebra, initial encounter for closed fracture: Secondary | ICD-10-CM | POA: Diagnosis not present

## 2021-12-31 NOTE — Discharge Instructions (Signed)
Apply ice for 30 minutes at a time, 4 times a day.  Keep your leg elevated is much as possible.  You may take acetaminophen as needed for pain.

## 2021-12-31 NOTE — ED Provider Notes (Addendum)
[facility] EMERGENCY DEPT Provider Note   CSN: [id number] Arrival date & time: 12/31/21  0255     History  Chief Complaint  Patient presents with   Lytle Michaels    [name] is a 66 y.o. female.  The history is provided by the patient.  Fall She has history of hyperlipidemia, GERD, fibromyalgia and comes in after falling out of bed.  She is complaining of pain in her left lower leg and ankle.  She also hit her face on the floor.  She denies loss of consciousness denies nausea or vomiting.  She is on low-dose aspirin but not on any other antiplatelet agents and not on any anticoagulants.   Home Medications Prior to Admission medications   Medication Sig Start Date End Date Taking? Authorizing Provider  acetaminophen (TYLENOL) 500 MG tablet Take 1,000 mg by mouth every 8 (eight) hours as needed for moderate pain.    [provider]  allopurinol (ZYLOPRIM) 300 MG tablet TAKE 1 TABLET BY MOUTH EVERY DAY 07/24/21   Rita Ohara, MD  ALPRAZolam Duanne Moron) 0.25 MG tablet Take 1 tablet (0.25 mg total) by mouth 3 (three) times daily as needed for anxiety. 09/08/21   Mozingo, Berdie Ogren, NP  ARIPiprazole (ABILIFY) 5 MG tablet Take 1 tablet (5 mg total) by mouth daily. 09/08/21   Mozingo, Berdie Ogren, NP  Armodafinil 250 MG tablet Take 1 tablet (250 mg total) by mouth daily with breakfast. 12/14/21   Bo Merino, MD  Armodafinil 250 MG tablet Take 1 tablet by mouth daily with breakfast. 12/14/21   Bo Merino, MD  aspirin 81 MG chewable tablet     [provider]  atorvastatin (LIPITOR) 40 MG tablet TAKE 1 TABLET BY MOUTH EVERY DAY 07/20/21   Rita Ohara, MD  CALCIUM PO Take 500 mg by mouth in the morning, at noon, and at bedtime.    [provider]  cetirizine (ZYRTEC) 10 MG tablet Take 10 mg by mouth at bedtime.    [provider]  colchicine 0.6 MG tablet Take 1.2mg  (2 tablets) then 0.6mg  (1 tablet) 1 hour after. Then, take 1 tablet  every day for 7 days. Patient not taking: Reported on 12/07/2021 08/24/21   Criselda Peaches, DPM  diphenhydrAMINE (BENADRYL) 25 MG tablet Take 25 mg by mouth every 6 (six) hours as needed.    [provider]  doxycycline (VIBRA-TABS) 100 MG tablet Take 1 tablet (100 mg total) by mouth 2 (two) times daily. 11/30/21   McDonald, Stephan Minister, DPM  DULoxetine (CYMBALTA) 60 MG capsule Take 1 capsule (60 mg total) by mouth 2 (two) times daily. 09/08/21   Mozingo, Berdie Ogren, NP  fluticasone (FLONASE) 50 MCG/ACT nasal spray PLACE 2 SPRAYS INTO BOTH NOSTRILS DAILY AS NEEDED FOR ALLERGIES 11/14/20   Rita Ohara, MD  gabapentin (NEURONTIN) 300 MG capsule Take 300 mg by mouth 3 (three) times daily.    [provider]  HYDROcodone-acetaminophen (NORCO/VICODIN) 5-325 MG tablet Take 1 tablet by mouth every 4 (four) hours as needed for moderate pain. Patient not taking: Reported on 12/07/2021 04/06/21   Edrick Kins, DPM  Insulin Pen Needle 31G X 5 MM MISC Use to inject Forteo once daily. DISCARD AFTER USE. 07/27/21   Ofilia Neas, PA-C  lidocaine (LIDODERM) 5 % Place 1 patch onto the skin daily. Remove & Discard patch within 12 hours or as directed by MD Patient not taking: Reported on 09/25/2021 07/04/21   Fatima Blank, MD  meclizine (ANTIVERT) 25 MG tablet Take 1 tablet (25 mg total) by mouth 3 (three) times daily as needed for dizziness. 07/22/19   Rita Ohara, MD  Melatonin 10 MG TABS Take 5 mg by mouth at bedtime.     [provider]  methocarbamol (ROBAXIN) 500 MG tablet TAKE 1 TABLET BY MOUTH EVERY DAY AS NEEDED 10/23/21   Bo Merino, MD  metoprolol tartrate (LOPRESSOR) 50 MG tablet TAKE 1 TABLET BY MOUTH TWICE A DAY 12/20/21   Rita Ohara, MD  Multiple Vitamins-Minerals (BARIATRIC MULTIVITAMINS/IRON PO) Take 2 each by mouth daily.    [provider]  Multiple Vitamins-Minerals (ICAPS AREDS 2) CAPS Take 1 capsule by mouth 2 (two) times daily.    [provider]  mupirocin ointment (BACTROBAN) 2 % Apply 1 application topically daily. 11/07/21   McDonald, Stephan Minister, DPM  nortriptyline (PAMELOR) 50 MG capsule Take 100 mg by mouth at bedtime.    [provider]  ondansetron (ZOFRAN) 8 MG tablet Take 8 mg by mouth every 8 (eight) hours as needed for nausea. 07/16/18   [provider]  OXcarbazepine (TRILEPTAL) 150 MG tablet Take 150 mg by mouth 2 (two) times daily. 10/24/20   [provider]  pantoprazole (PROTONIX) 40 MG tablet Take 1 tablet (40 mg total) by mouth daily. 10/01/19   Johnathan Hausen, MD  Polyethyl Glycol-Propyl Glycol 0.4-0.3 % SOLN     [provider]  Probiotic Product ([place]) CAPS Take 1 capsule by mouth at bedtime.     [provider]  prochlorperazine (COMPAZINE) 10 MG tablet Take 10 mg by mouth as needed. 10/27/20   [provider]  promethazine (PHENERGAN) 25 MG tablet Take 25 mg by mouth as needed. 10/27/20   [provider]  Propylene Glycol 0.6 % SOLN Place 1 drop into both eyes daily at 12 noon. Patient not taking: Reported on 12/07/2021    [provider]  RESTASIS 0.05 % ophthalmic emulsion 1 drop 2 (two) times daily. 12/10/19   [provider]  Teriparatide, Recombinant, (FORTEO) 600 MCG/2.4ML SOPN Inject 20 mcg into the skin daily. 11/13/21   Ofilia Neas, PA-C      Allergies    Erythromycin, Meperidine hcl, Polyethyl glyc-propyl glyc pf, Tramadol, and Dilaudid [hydromorphone hcl]    Review of Systems   Review of Systems  All other systems reviewed and are negative.  Physical Exam Updated Vital Signs BP 118/69 (BP Location: Right Arm)    Pulse (!) 58    Temp 97.8 F (36.6 C) (Oral)    Resp 20    Ht 5' 2.5" (1.588 m)    Wt 83.5 kg    LMP 07/27/2006    SpO2 97%    BMI 33.12 kg/m  Physical Exam Vitals and nursing note reviewed.  66 year old female, resting comfortably and in no acute distress. Vital signs are  significant for borderline low heart rate. Oxygen saturation is 97%, which is normal. Head is normocephalic and atraumatic. PERRLA, EOMI. Oropharynx is clear. Neck is nontender. Back is mildly tender in the upper lumbar and lower thoracic spine.  There is no CVA tenderness. Lungs are clear without rales, wheezes, or rhonchi. Chest is nontender. Heart has regular rate and rhythm without murmur. Abdomen is soft, flat, nontender without masses or hepatosplenomegaly and peristalsis is normoactive. Extremities: Ecchymosis noted in the anteromedial aspect of the left lower leg with tenderness in the same area.  No other area of tenderness elicited.  There is full passive range of motion of all joints without significant pain. Skin is warm and dry without rash. Neurologic: Mental status is normal, cranial nerves are intact, moves all extremities equally.  ED Results / Procedures / Treatments    Radiology No results found.  Procedures Procedures    Medications Ordered in ED Medications - No data to display  ED Course/ Medical Decision Making/ A&P                           Medical Decision Making Amount and/or Complexity of Data Reviewed Radiology: ordered.   Fall with injury to left leg.  Also, minor facial injury, pain in mid back, without obvious physical findings.  She will be sent for x-rays of her leg and spine as well as CT of head and cervical spine.  Radiology results have not crossed over, but CT of the head and cervical spine showed no evidence of acute injury.  X-rays of the left tibia and fibula showed no fracture.  X-rays of left ankle showed no fracture.  X-rays of lumbar and thoracic spine showed chronic compression fractures, no acute injury.  I have independently viewed all of these images and agree with radiologist's interpretation.  Patient is reassured regarding radiology findings, discharged with instructions to apply ice and use over-the-counter analgesics as needed  for pain.        Final Clinical Impression(s) / ED Diagnoses Final diagnoses:  Accidental fall from bed, initial encounter  Contusion of left lower leg, initial encounter    Rx / DC Orders ED Discharge Orders     None         Delora Fuel, MD 29/51/88 [phone]  Results for orders placed or performed in visit on 12/07/21  Rheumatoid factor  Result Value Ref Range   Rhuematoid fact SerPl-aCnc <10.0 <14.0 IU/mL   DG Thoracic Spine W/Swimmers  Result Date: 12/31/2021 CLINICAL DATA:  66 year old female status post fall. EXAM: THORACIC SPINE - 3 VIEWS COMPARISON:  Thoracic spine radiographs 07/04/2021, 10/13/2021. FINDINGS: Normal thoracic segmentation. Interval augmentation of T8 and T9 vertebral bodies, with T8 compression fracture demonstrated in November. Chronic T12 superior endplate and L1 inferior endplate compression fractures also noted. Other thoracic vertebrae appear stable and intact. Cervicothoracic junction alignment is within normal limits. Stable thoracic disc spaces. Grossly intact posterior ribs. Stable cholecystectomy clips. Stable visible chest and abdominal visceral contours, with left upper quadrant staple line also. Quadrant surgical clips also. IMPRESSION: 1. Interval T8 and T9 augmentation. Chronic T12 and L1 compression fractures. 2. No acute osseous abnormality identified in the thoracic spine. Electronically Signed   By: Genevie Ann M.D.   On: 12/31/2021 05:20   DG Lumbar Spine Complete  Result Date: 12/31/2021 CLINICAL DATA:  66 year old female status post fall. EXAM: LUMBAR SPINE - COMPLETE 4+ VIEW COMPARISON:  Lumbar radiographs 10/13/2021. Thoracic spine radiographs today. FINDINGS: Normal lumbar segmentation. Chronic T12 superior endplate and L1 inferior endplate compression fractures are stable since November. Other lumbar levels appear stable and intact. Preserved lordosis. Relatively preserved disc spaces for age, mild chronic L1-L2 and L4-L5 disc space loss  with endplate spurring. Grossly intact visible sacrum. No acute osseous abnormality identified. Negative visible bowel gas pattern. IMPRESSION: 1. No acute osseous abnormality identified in the lumbar spine. 2. Chronic T12 and L1 compression fractures. Electronically Signed   By: Genevie Ann M.D.   On: 12/31/2021 05:22   DG Tibia/Fibula Left  Result Date: 12/31/2021  CLINICAL DATA:  66 year old female status post fall. EXAM: LEFT TIBIA AND FIBULA - 2 VIEW COMPARISON:  None. FINDINGS: Left total knee arthroplasty. Hardware appears intact and aligned. Intact left tibia and fibula. Maintained alignment at the left ankle. No evidence of ankle joint effusion. No acute osseous abnormality identified. No discrete soft tissue injury. IMPRESSION: No acute fracture or dislocation identified about the left tib-fib. Left knee arthroplasty. Electronically Signed   By: Genevie Ann M.D.   On: 12/31/2021 05:15   DG Ankle Complete Left  Result Date: 12/31/2021 CLINICAL DATA:  66 year old female status post fall. EXAM: LEFT ANKLE COMPLETE - 3+ VIEW COMPARISON:  Left tib fib series today. Left foot series 02/22/2014. FINDINGS: Advanced chronic degenerative changes in the dorsal tarsal bones, midfoot. Mortise joint alignment appears maintained. Talar dome intact. Mild chronic spurring at both the medial and lateral malleolus. No evidence of ankle joint effusion. No acute fracture or dislocation identified. Calcaneus appears intact. IMPRESSION: 1. No acute fracture or dislocation identified about the left ankle. 2. Advanced chronic degeneration of the dorsal midfoot. Electronically Signed   By: Genevie Ann M.D.   On: 12/31/2021 05:17   CT Head Wo Contrast  Result Date: 12/31/2021 CLINICAL DATA:  66 year old female status post fall. EXAM: CT HEAD WITHOUT CONTRAST TECHNIQUE: Contiguous axial images were obtained from the base of the skull through the vertex without intravenous contrast. RADIATION DOSE REDUCTION: This exam was performed  according to the departmental dose-optimization program which includes automated exposure control, adjustment of the mA and/or kV according to patient size and/or use of iterative reconstruction technique. COMPARISON:  Head CT and brain MRI 01/22/2021. FINDINGS: Brain: Stable cerebral volume. No midline shift, ventriculomegaly, mass effect, evidence of mass lesion, intracranial hemorrhage or evidence of cortically based acute infarction. Gray-white matter differentiation is within normal limits throughout the brain. No encephalomalacia identified. Vascular: Mild calcified atherosclerosis at the skull base. No suspicious intracranial vascular hyperdensity. Skull: Stable.  No acute osseous abnormality identified. Sinuses/Orbits: Visualized paranasal sinuses and mastoids are stable and well aerated. Other: No orbit or scalp soft tissue injury identified. IMPRESSION: 1. No acute traumatic injury identified. 2. Stable and normal for age non contrast CT appearance of the brain. Electronically Signed   By: Genevie Ann M.D.   On: 12/31/2021 04:59   CT Cervical Spine Wo Contrast  Result Date: 12/31/2021 CLINICAL DATA:  66 year old female status post fall. EXAM: CT CERVICAL SPINE WITHOUT CONTRAST TECHNIQUE: Multidetector CT imaging of the cervical spine was performed without intravenous contrast. Multiplanar CT image reconstructions were also generated. RADIATION DOSE REDUCTION: This exam was performed according to the departmental dose-optimization program which includes automated exposure control, adjustment of the mA and/or kV according to patient size and/or use of iterative reconstruction technique. COMPARISON:  Cervical spine MRI 01/22/2021. head CT today reported separately. FINDINGS: Alignment: Chronic straightening but increased mild reversal of cervical lordosis from last year. Cervicothoracic junction alignment is within normal limits. Bilateral posterior element alignment is within normal limits. Skull base and  vertebrae: Visualized skull base is intact. No atlanto-occipital dissociation. C1 and C2 appear intact and aligned. No acute osseous abnormality identified. Soft tissues and spinal canal: No prevertebral fluid or swelling. No visible canal hematoma. Retropharyngeal course of the left carotid. Negative visible noncontrast neck soft tissues. Disc levels: Chronic anterior C1-odontoid joint degeneration with prominent osteophytosis. But elsewhere stable mild for age cervical spine degeneration as demonstrated by MRI last year. Upper chest: Negative. IMPRESSION: 1. No acute traumatic injury identified in  the cervical spine. 2. Mild for age cervical spine degeneration stable from MRI last year. Electronically Signed   By: Genevie Ann M.D.   On: 12/31/2021 05:14   MM 3D SCREEN BREAST BILATERAL  Result Date: 12/26/2021 CLINICAL DATA:  Screening. EXAM: DIGITAL SCREENING BILATERAL MAMMOGRAM WITH TOMOSYNTHESIS AND CAD TECHNIQUE: Bilateral screening digital craniocaudal and mediolateral oblique mammograms were obtained. Bilateral screening digital breast tomosynthesis was performed. The images were evaluated with computer-aided detection. COMPARISON:  Previous exam(s). ACR Breast Density Category c: The breast tissue is heterogeneously dense, which may obscure small masses. FINDINGS: There are no findings suspicious for malignancy. IMPRESSION: No mammographic evidence of malignancy. A result letter of this screening mammogram will be mailed directly to the patient. RECOMMENDATION: Screening mammogram in one year. (Code:SM-B-01Y) BI-RADS CATEGORY  1: Negative. Electronically Signed   By: Kristopher Oppenheim M.D.   On: 29/47/6546 50:35      Delora Fuel, MD 46/56/81 [phone]

## 2021-12-31 NOTE — ED Triage Notes (Signed)
Pt was getting up from bed when her left lower leg got caught under the bed. Pt has a hematoma to her left lower leg. C/o left side of her face hurting, and upper back pain. States this happened at 2am. Took tylenol for pain at 2am. Denies loc. States she thinks she hit her head on carpeted floor.

## 2022-01-04 ENCOUNTER — Ambulatory Visit: Payer: Medicare Other | Admitting: Podiatry

## 2022-01-04 ENCOUNTER — Other Ambulatory Visit: Payer: Self-pay

## 2022-01-04 DIAGNOSIS — L97512 Non-pressure chronic ulcer of other part of right foot with fat layer exposed: Secondary | ICD-10-CM

## 2022-01-04 MED ORDER — TRAMADOL HCL 50 MG PO TABS
50.0000 mg | ORAL_TABLET | Freq: Three times a day (TID) | ORAL | 0 refills | Status: AC | PRN
Start: 2022-01-04 — End: 2022-01-09

## 2022-01-05 NOTE — Telephone Encounter (Signed)
Received a fax from [facility] regarding an approval for [place] patient assistance from 01/05/22 to 11/25/22.   Phone number: [phone]  ATC patient - unable to reach. Left VM advising that she is approved and may call [employer] to schedule shipment of Forteo  Knox Saliva, PharmD, MPH, BCPS Clinical Pharmacist (Rheumatology and Pulmonology)

## 2022-01-08 ENCOUNTER — Encounter: Payer: Medicare Other | Admitting: Family Medicine

## 2022-01-09 ENCOUNTER — Other Ambulatory Visit: Payer: Self-pay | Admitting: *Deleted

## 2022-01-09 DIAGNOSIS — M8000XA Age-related osteoporosis with current pathological fracture, unspecified site, initial encounter for fracture: Secondary | ICD-10-CM

## 2022-01-09 MED ORDER — FORTEO 600 MCG/2.4ML ~~LOC~~ SOPN: 20.0000 ug | PEN_INJECTOR | Freq: Every day | SUBCUTANEOUS | 2 refills | Status: DC

## 2022-01-09 NOTE — Telephone Encounter (Signed)
Refill request received via fax  Next Visit: 06/06/2022  Last Visit: 12/07/2021  Last Fill: 11/13/2021  DX: Age-related osteoporosis with current pathological fracture  Current Dose per office note 12/07/2021: Forteo daily injections as prescribed.  She initiated Forteo on 08/07/2021.   Labs: 11/30/2021 CBC WNL, CMP 09/14/2021 CO2 32, Glucose 59, Alk Phos 150, AST 50, ALT 54  Okay to refill Forteo?

## 2022-01-09 NOTE — Progress Notes (Signed)
Subjective:  Patient ID: DEBERAH ADOLF, female    DOB: 05/11/1956,  MRN: [id number]  Chief Complaint  Patient presents with   Foot Ulcer    2 week follow up, skin ulcer right toe    66 y.o. female presents with the above complaint. History confirmed with patient.  She has been changing the dressing with the mupirocin ointment  Objective:  Physical Exam: warm, good capillary refill, no trophic changes or ulcerative lesions, normal DP and PT pulses, and normal sensory exam.  Right Foot: Full-thickness ulcer right plantar second toe no signs of infection purulent drainage or cellulitis has had quite a bit of improvement measures 0.3 time 0.3 x 0.2 cm       Radiographs show no definite erosions at the tip of the second toe but likely fractures in the third and fourth toes  Lab work was normal except for ESR which is slightly elevated at 55 Assessment:   1. Skin ulcer of toe of right foot with fat layer exposed ([place])       Plan:  Patient was evaluated and treated and all questions answered.  Ulcer right second toe -We discussed the etiology and factors that are a part of the wound healing process.  We also discussed the risk of infection both soft tissue and osteomyelitis from open ulceration.  Discussed the risk of limb loss if this happens or worsens. -Debridement as below. -Dressed with Iodosorb, DSD. -Continue home dressing changes daily with 2 x 2 gauze and mupirocin -Improved quite a bit hopefully heal by next visit and then we can plan surgery for the hallux -She is having quite a bit of pain from this as well.  I prescribed her tramadol to help alleviate the pain.  PDMP was reviewed prior to prescription  Procedure: Excisional Debridement of Wound Rationale: Removal of non-viable soft tissue from the wound to promote healing.  Anesthesia: none Post-Debridement Wound Measurements: 0.3 cm x 0.3 cm x 0.2 cm  Type of Debridement: Sharp Excisional Tissue Removed:  Non-viable soft tissue Depth of Debridement: subcutaneous tissue. Technique: Sharp excisional debridement to bleeding, viable wound base.  Dressing: Dry, sterile, compression dressing. Disposition: Patient tolerated procedure well.    Return in about 3 weeks (around 01/25/2022) for wound care.       Return in about 3 weeks (around 01/25/2022) for wound care.

## 2022-01-10 ENCOUNTER — Ambulatory Visit: Payer: Medicare Other | Admitting: Podiatry

## 2022-01-10 ENCOUNTER — Ambulatory Visit: Payer: Medicare Other | Admitting: Nurse Practitioner

## 2022-01-10 ENCOUNTER — Encounter: Payer: Self-pay | Admitting: Podiatry

## 2022-01-10 ENCOUNTER — Other Ambulatory Visit: Payer: Self-pay

## 2022-01-10 ENCOUNTER — Ambulatory Visit (INDEPENDENT_AMBULATORY_CARE_PROVIDER_SITE_OTHER): Payer: Medicare Other

## 2022-01-10 DIAGNOSIS — S99912A Unspecified injury of left ankle, initial encounter: Secondary | ICD-10-CM | POA: Diagnosis not present

## 2022-01-10 NOTE — Progress Notes (Signed)
Subjective:  Patient ID: [name], female    DOB: [birth year],   MRN: [id number]  Chief Complaint  Patient presents with   Foot Pain    I fell out of bed about a week and a half ago and went to the ER and they did x-rays and swelling and bruising     66 y.o. female presents for concern of left ankle injury that occurred a week ago. She fell out of the bed. She is not diabetic but has a wound on the second toe that Dr. Sherryle Lis has been treating. Was seen in ED and ruled out DVT for knot on the back of her left calf but no ultrasound preformed.  She has not had it wrapped or had a shoe. . Denies any other pedal complaints. Denies n/v/f/c.   Past Medical History:  Diagnosis Date   Anemia    previously followed by Dr. Jamse Arn for anemia and elevated platelets   Anxiety    C. difficile colitis 10/01/2012   treated by [facility]   Chronic fatigue syndrome    Closed wedge compression fracture of T8 vertebra ([place]) 06/2021   DDD (degenerative disc disease), lumbar 08/19/2014   and facet arthroplasty & left lumbar radiculopathy (Dr.Ramos)   Depression    Dyssynergia    dyssynergenic defecation, contributing to fecal incontinence.   Edema    Fibromyalgia    Gastroparesis    followed at [facility]   GERD (gastroesophageal reflux disease)    History of kidney stones    History of vertebral fracture 06/30/2021   Hyperlipidemia    Kidney stone    Lumbar radiculopathy    Migraine    Neuropathy    Obstructive sleep apnea    Does  wear  CPAP   Paresthesia    Dr. Everette Rank at [facility]   Pelvic floor dysfunction    pelvic floor dyssynergy   Plantar fasciitis 02/2011   R foot   Pneumonia    2012   PONV (postoperative nausea and vomiting)    pt states has gastroparesis has difficulty taking antibiotics and narcotics has severe nausea and vomiting    Restless leg syndrome    S/P endometrial ablation 08/09/2006   Novasure Ablation   S/P epidural steroid injection 09/20/2014   Dr.Ramos    Tremor    Dr. Everette Rank   Urinary frequency    Urinary incontinence     Objective:  Physical Exam: Vascular: DP/PT pulses 2/4 bilateral. CFT <3 seconds. Normal hair growth on digits. No edema.  Skin. No lacerations or abrasions bilateral feet. Right second digit ulceration dressed. Large 7 cm knot noted to left medial calf. Consistent wit hematoma.  Musculoskeletal: MMT 5/5 bilateral lower extremities in DF, PF, Inversion and Eversion. Deceased ROM in DF of ankle joint. Mildly tender to medial anterior ankle joint line. Some tenderness along PT tendon. No pain around lateral ankle. Some ecchymosis noted and edema.  Neurological: Sensation intact to light touch.   Assessment:   1. Ankle injury, left, initial encounter      Plan:  Patient was evaluated and treated and all questions answered. -Xrays reviewed. No obvious fracture or dislocations noted. Midfoot degenerative changes and some changes noted around medial ankle.  -Discussed treatement options for ankle sprain and hematoma; risks, alternatives, and benefits explained. -Discussed icing and elevating.  -Dispensed Tri-lock ankle brace.  -Recommend protection, rest, ice, elevation daily until symptoms improve -Rx pain med/antinflammatories as needed -Patient to return to office a couple weeks  for follow-up with Dr. Sherryle Lis.    Lorenda Peck, DPM

## 2022-01-15 ENCOUNTER — Other Ambulatory Visit (HOSPITAL_COMMUNITY): Payer: Self-pay

## 2022-01-16 ENCOUNTER — Telehealth: Payer: Self-pay | Admitting: Rheumatology

## 2022-01-16 NOTE — Telephone Encounter (Signed)
Spoke with patient advised we sent a new prescription on 01/09/2022. Patient states she will contact the pharmacy. Patient to call back if she has any trouble setting up shipment.

## 2022-01-16 NOTE — Telephone Encounter (Signed)
Patient called the office stating her insurance is requiring a new prescription for Forteo 660mcg/ 2.57ml to be sent to [facility]. Patient states she was approved for financial aid through the [employer] for this year.

## 2022-01-17 ENCOUNTER — Other Ambulatory Visit: Payer: Self-pay

## 2022-01-17 ENCOUNTER — Telehealth (INDEPENDENT_AMBULATORY_CARE_PROVIDER_SITE_OTHER): Payer: Medicare Other | Admitting: Medical

## 2022-01-17 ENCOUNTER — Other Ambulatory Visit: Payer: Self-pay | Admitting: Family Medicine

## 2022-01-17 VITALS — BP 127/82 | HR 93 | Wt 180.0 lb

## 2022-01-17 DIAGNOSIS — H9201 Otalgia, right ear: Secondary | ICD-10-CM | POA: Diagnosis not present

## 2022-01-17 DIAGNOSIS — R052 Subacute cough: Secondary | ICD-10-CM

## 2022-01-17 DIAGNOSIS — J3489 Other specified disorders of nose and nasal sinuses: Secondary | ICD-10-CM

## 2022-01-17 DIAGNOSIS — E785 Hyperlipidemia, unspecified: Secondary | ICD-10-CM

## 2022-01-17 MED ORDER — AMOXICILLIN 875 MG PO TABS: 875.0000 mg | ORAL_TABLET | Freq: Two times a day (BID) | ORAL | 0 refills | Status: AC

## 2022-01-17 MED ORDER — ALBUTEROL SULFATE HFA 108 (90 BASE) MCG/ACT IN AERS: 2.0000 | INHALATION_SPRAY | Freq: Four times a day (QID) | RESPIRATORY_TRACT | 0 refills | Status: DC | PRN

## 2022-01-17 NOTE — Progress Notes (Signed)
Subjective:     Patient ID: [name], female   DOB: 12/21/1955, 66 y.o.   MRN: [id number]  This visit type was conducted due to national recommendations for restrictions regarding the COVID-19 Pandemic (e.g. social distancing) in an effort to limit this patient's exposure and mitigate transmission in our community.  Due to their co-morbid illnesses, this patient is at least at moderate risk for complications without adequate follow up.  This format is felt to be most appropriate for this patient at this time.    Documentation for virtual audio and video telecommunications through [facility] encounter:  The patient was located at home. The provider was located in the office. The patient did consent to this visit and is aware of possible charges through their insurance for this visit.  The other persons participating in this telemedicine service were none. Time spent on call was 20 minutes and in review of previous records 20 minutes total.  This virtual service is not related to other E/M service within previous 7 days.   HPI Chief Complaint  Patient presents with   cough    Cough, congestion, wheezing some, HA, drainage, ear pain, sore throat. X 3 weeks.    Virtual consult for not feeling well.  Symptoms have been persistent for 3 weeks now.  Started out with cough and congestion, had some thick yellow-green mucus did not cleared up for little while and then still congested with productive sputum and phlegm now.  She notes ongoing sinus congestion, chest congestion, headache, ear pressure, right ear pressure, some wheezing, some sore throat.  No fever, no body aches or chills.  She has used an inhaler in the past but does not have one currently.  No history of lung disease.  No sick contacts. No other aggravating or relieving factors. No other complaint.   Past Medical History:  Diagnosis Date   Anemia    previously followed by Dr. Jamse Arn for anemia and elevated platelets    Anxiety    C. difficile colitis 10/01/2012   treated by [facility]   Chronic fatigue syndrome    Closed wedge compression fracture of T8 vertebra ([place]) 06/2021   DDD (degenerative disc disease), lumbar 08/19/2014   and facet arthroplasty & left lumbar radiculopathy (Dr.Ramos)   Depression    Dyssynergia    dyssynergenic defecation, contributing to fecal incontinence.   Edema    Fibromyalgia    Gastroparesis    followed at [facility]   GERD (gastroesophageal reflux disease)    History of kidney stones    History of vertebral fracture 06/30/2021   Hyperlipidemia    Kidney stone    Lumbar radiculopathy    Migraine    Neuropathy    Obstructive sleep apnea    Does  wear  CPAP   Paresthesia    Dr. Everette Rank at [facility]   Pelvic floor dysfunction    pelvic floor dyssynergy   Plantar fasciitis 02/2011   R foot   Pneumonia    2012   PONV (postoperative nausea and vomiting)    pt states has gastroparesis has difficulty taking antibiotics and narcotics has severe nausea and vomiting    Restless leg syndrome    S/P endometrial ablation 08/09/2006   Novasure Ablation   S/P epidural steroid injection 09/20/2014   Dr.Ramos   Tremor    Dr. Everette Rank   Urinary frequency    Urinary incontinence    Current Outpatient Medications on File Prior to Visit  Medication Sig Dispense  Refill   acetaminophen (TYLENOL) 500 MG tablet Take 1,000 mg by mouth every 8 (eight) hours as needed for moderate pain.     allopurinol (ZYLOPRIM) 300 MG tablet TAKE 1 TABLET BY MOUTH EVERY DAY 90 tablet 1   ALPRAZolam (XANAX) 0.25 MG tablet Take 1 tablet (0.25 mg total) by mouth 3 (three) times daily as needed for anxiety. 90 tablet 2   ARIPiprazole (ABILIFY) 5 MG tablet Take 1 tablet (5 mg total) by mouth daily. 90 tablet 1   Armodafinil 250 MG tablet Take 1 tablet (250 mg total) by mouth daily with breakfast. 30 tablet 0   Armodafinil 250 MG tablet Take 1 tablet by mouth daily with breakfast. 30 tablet 0    aspirin 81 MG chewable tablet      atorvastatin (LIPITOR) 40 MG tablet TAKE 1 TABLET BY MOUTH EVERY DAY 90 tablet 1   CALCIUM PO Take 500 mg by mouth in the morning, at noon, and at bedtime.     cetirizine (ZYRTEC) 10 MG tablet Take 10 mg by mouth at bedtime.     colchicine 0.6 MG tablet Take 1.2mg  (2 tablets) then 0.6mg  (1 tablet) 1 hour after. Then, take 1 tablet every day for 7 days. 10 tablet 2   diphenhydrAMINE (BENADRYL) 25 MG tablet Take 25 mg by mouth every 6 (six) hours as needed.     DULoxetine (CYMBALTA) 60 MG capsule Take 1 capsule (60 mg total) by mouth 2 (two) times daily. 180 capsule 1   fluticasone (FLONASE) 50 MCG/ACT nasal spray PLACE 2 SPRAYS INTO BOTH NOSTRILS DAILY AS NEEDED FOR ALLERGIES 16 mL 2   gabapentin (NEURONTIN) 300 MG capsule Take 300 mg by mouth 3 (three) times daily.     meclizine (ANTIVERT) 25 MG tablet Take 1 tablet (25 mg total) by mouth 3 (three) times daily as needed for dizziness. 30 tablet 0   Melatonin 10 MG TABS Take 5 mg by mouth at bedtime.      methocarbamol (ROBAXIN) 500 MG tablet TAKE 1 TABLET BY MOUTH EVERY DAY AS NEEDED 30 tablet 2   metoprolol tartrate (LOPRESSOR) 50 MG tablet TAKE 1 TABLET BY MOUTH TWICE A DAY 180 tablet 0   Multiple Vitamins-Minerals (BARIATRIC MULTIVITAMINS/IRON PO) Take 2 each by mouth daily.     Multiple Vitamins-Minerals (ICAPS AREDS 2) CAPS Take 1 capsule by mouth 2 (two) times daily.     mupirocin ointment (BACTROBAN) 2 % Apply 1 application topically daily. 30 g 2   OXcarbazepine (TRILEPTAL) 150 MG tablet Take 150 mg by mouth 2 (two) times daily.     pantoprazole (PROTONIX) 40 MG tablet Take 1 tablet (40 mg total) by mouth daily. 90 tablet 0   RESTASIS 0.05 % ophthalmic emulsion 1 drop 2 (two) times daily.     Teriparatide, Recombinant, (FORTEO) 600 MCG/2.4ML SOPN Inject 20 mcg into the skin daily. 2.4 mL 2   Insulin Pen Needle 31G X 5 MM MISC Use to inject Forteo once daily. DISCARD AFTER USE. 100 each 2   nortriptyline  (PAMELOR) 50 MG capsule Take 100 mg by mouth at bedtime. (Patient not taking: Reported on 01/17/2022)     ondansetron (ZOFRAN) 8 MG tablet Take 8 mg by mouth every 8 (eight) hours as needed for nausea. (Patient not taking: Reported on 01/17/2022)  0   ondansetron (ZOFRAN-ODT) 4 MG disintegrating tablet ondansetron 4 mg disintegrating tablet (Patient not taking: Reported on 01/17/2022)     promethazine (PHENERGAN) 25 MG tablet Take 25 mg  by mouth as needed. (Patient not taking: Reported on 01/17/2022)     No current facility-administered medications on file prior to visit.     Review of Systems As in subjective    Objective:   Physical Exam Due to coronavirus pandemic stay at home measures, patient visit was virtual and they were not examined in person.   BP 127/82    Pulse 93    Wt 180 lb (81.6 kg)    LMP 07/27/2006    BMI 32.40 kg/m   Wt Readings from Last 3 Encounters:  01/17/22 180 lb (81.6 kg)  12/31/21 184 lb (83.5 kg)  12/07/21 188 lb (85.3 kg)   Gen: wd, wn, nad Alert and oriented x3, answers questions appropriately No wheezing or labored breathing     Assessment:     Encounter Diagnoses  Name Primary?   Subacute cough Yes   Otalgia of right ear    Purulent nasal discharge         Plan:     We discussed her symptoms and concerns.  I gave her the option of coming up in person today for vitals and exam.  She will consider this.  Begin medication as below, rest, hydrate well.  If not seeing significant improvements over the next 3 to 4 days, or if worse in that timeframe then get reevaluated.  Discussed inhaler proper use.  She has used this before.  Continue supportive measures over-the-counter such as Mucinex DM, rest, hydration.  [name] was seen today for cough.  Diagnoses and all orders for this visit:  Subacute cough  Otalgia of right ear  Purulent nasal discharge  Other orders -     amoxicillin (AMOXIL) 875 MG tablet; Take 1 tablet (875 mg total) by  mouth 2 (two) times daily for 10 days. -     albuterol (VENTOLIN HFA) 108 (90 Base) MCG/ACT inhaler; Inhale 2 puffs into the lungs every 6 (six) hours as needed for wheezing or shortness of breath.  F/u prn

## 2022-01-18 ENCOUNTER — Ambulatory Visit: Payer: Medicare Other | Admitting: Nurse Practitioner

## 2022-01-25 ENCOUNTER — Ambulatory Visit: Payer: Medicare Other | Admitting: Podiatry

## 2022-01-25 ENCOUNTER — Other Ambulatory Visit: Payer: Self-pay

## 2022-01-25 DIAGNOSIS — L603 Nail dystrophy: Secondary | ICD-10-CM

## 2022-01-25 DIAGNOSIS — M2041 Other hammer toe(s) (acquired), right foot: Secondary | ICD-10-CM | POA: Diagnosis not present

## 2022-01-25 DIAGNOSIS — L97512 Non-pressure chronic ulcer of other part of right foot with fat layer exposed: Secondary | ICD-10-CM

## 2022-01-25 NOTE — Progress Notes (Signed)
?  Subjective:  ?Patient ID: [name], female    DOB: [birth year],  MRN: [id number] ? ?Chief Complaint  ?Patient presents with  ? Foot Ulcer  ?   [facility] WOUND CARE   ? ? ?66 y.o. female presents with the above complaint. History confirmed with patient.  She has been changing the dressing with the mupirocin ointment.  The second toenail is bothersome as well. ? ?Objective:  ?Physical Exam: ?warm, good capillary refill, no trophic changes or ulcerative lesions, normal DP and PT pulses, and normal sensory exam. ? ?Right Foot: Full-thickness ulcer right plantar second toe no signs of infection there is no purulent drainage or cellulitis today it measures 0.3 x 0.3 x 0.2 cm.  Reducible hammertoe contracture second toe.  Full nail plate dystrophy of the second toe as well ? ? ? ? ?Radiographs show no definite erosions at the tip of the second toe but likely fractures in the third and fourth toes ? ?Lab work was normal except for ESR which is slightly elevated at 55 ?Assessment:  ? ?1. Skin ulcer of toe of right foot with fat layer exposed ([place])   ?2. Hammertoe of right foot   ?3. Nail dystrophy   ? ? ? ? ? ?Plan:  ?Patient was evaluated and treated and all questions answered. ? ?Ulcer right second toe ?-We discussed the etiology and factors that are a part of the wound healing process.  We also discussed the risk of infection both soft tissue and osteomyelitis from open ulceration.  Discussed the risk of limb loss if this happens or worsens. ?-Debridement as below. ?-Dressed with Iodosorb, DSD. ?-Continue home dressing changes daily with 2 x 2 gauze and mupirocin.  First change in 4 days and apply mupirocin to ulcer toenail avulsion site and tenotomy site ? ?Procedure: Excisional Debridement of Wound ?Rationale: Removal of non-viable soft tissue from the wound to promote healing.  ?Anesthesia: none ?Post-Debridement Wound Measurements: 0.3 cm x 0.3 cm x 0.2 cm  ?Type of Debridement: Sharp Excisional ?Tissue Removed:  Non-viable soft tissue ?Depth of Debridement: subcutaneous tissue. ?Technique: Sharp excisional debridement to bleeding, viable wound base.  ?Dressing: Dry, sterile, compression dressing. ?Disposition: Patient tolerated procedure well.  ? ? ?Today we also discussed the dystrophic toenail as well as the contracture of the toe.  Both I think are contributing to pressure on the wound and impairing wound healing.  She would like the nail plate to be removed.  This required digital anesthesia and I recommended flexor tenotomy to alleviate the contracture of the second hammertoe and facilitate wound healing as well.  Following sterile prep with Betadine and digital block the second toenail plate was removed in a temporary fashion no phenol matricectomy was performed.  An 18-gauge needle was then used to release the long flexor tendon to release the contracture and adequate reduction of the deformity was obtained.  The sites were dressed with Silvadene and dry sterile dressing compression dressing that she will leave in place until Monday.  Post care instructions given.  I will see her back in 3 weeks for follow-up ? ?Return in about 3 weeks (around 02/15/2022) for wound care, f/u tenotomy and removal of nail .  ? ? ?

## 2022-01-26 ENCOUNTER — Telehealth: Payer: Self-pay | Admitting: Podiatry

## 2022-01-26 MED ORDER — TRAMADOL HCL 50 MG PO TABS
50.0000 mg | ORAL_TABLET | Freq: Three times a day (TID) | ORAL | 0 refills | Status: AC | PRN
Start: 2022-01-26 — End: 2022-01-31

## 2022-01-26 NOTE — Telephone Encounter (Signed)
Patient has been called and information given to husband concerning medication sent to pharmacy,directions that she may still take the tylenol with tramadol, said that he will give her the message.

## 2022-01-26 NOTE — Telephone Encounter (Signed)
Pt called and said Dr Sherryle Lis told pt if the tylenol did not help with the pain to call and he would give her something stronger. She tried the tylenol and it did not work very well last night(she stated she cannot take ibuprofen and should not due tylenol due to liver enzymes.  ?Could she get some pain medication called in. [facility] [address]. ?

## 2022-01-31 DIAGNOSIS — G4733 Obstructive sleep apnea (adult) (pediatric): Secondary | ICD-10-CM | POA: Diagnosis not present

## 2022-02-04 NOTE — Progress Notes (Unsigned)
Chief Complaint  Patient presents with   Pre-diabetes    Fasting med check. Patient would like you to check her left leg-seen for hematoma from fall. Also her right ear is bothering her, feels like fluid may be in there. Would like covid booster today.     Patient presents for 6 month f/u on chronic problems. She has a complex history, and sees many specialists.  She was recently seen by Audelia Acton for respiratory illness--seen 01/17/22 and was prescribed amoxil and albuterol for subacute cough, R ear pain, purulent nasal d/c.  Her symptoms improved with the antibiotics.  She has noticed again some discomfort over the past few days in the right ear, "stopped up", but today feels okay.  Denies fever or any recurrent congestion or drainage.  She had fallen out of bed last month.  She has a hematoma at the R medial leg.  It sometimes hurts. It is overall getting much better, is much smaller than it was.   She has been seeing Dr. Sherryle Lis for an ulcer on her R 2nd toe. She reports he "snipped"/released a tendon at last visit, and it has been doing better.  Pre-diabetes and obesity:  A1c was up to 6.3% in 2019.  It was  5.7% at her physical last year. She had Roux-En-Y on 09/29/2019.  She is followed at [facility] by the surgeons; no longer seeing the nutritionist.  She used to go to the bariatric exercise program 3x/week, but none since kyphoplasty, and the toe ulcer. She eats more fruits than vegetables (she is trying).  Only occasional sweets.  Lab Results  Component Value Date   HGBA1C 5.7 (A) 06/28/2021    Hyperlipidemia follow-up:  Patient is trying to follow a low-fat, low cholesterol diet as best she can.  Compliant with medications (atorvastatin '40mg'$ ) and denies medication side effects. She is due for recheck, last lipids checked over a year ago.  She has had elevated LFT's.  Last checked by neuro 08/2021, AST/ALT were 50/54. Lab Results  Component Value Date   CHOL 118 01/22/2021   HDL 45  01/22/2021   LDLCALC 54 01/22/2021   TRIG 97 01/22/2021   CHOLHDL 2.6 01/22/2021    Hypertension: (never formally diagnosed, because had been on diuretics for fluid retention (stopped a while ago) and beta blockers for tachycardia and tremor--but high BP's noted when not taking these medications in past).   She continues on metoprolol '50mg'$  BID (originally prescribed by Dr. Johnsie Cancel, for tachycardia). Denies chest pain, palpitations/tachycardia, lightheadedness, edema. BP Readings from Last 3 Encounters:  02/05/22 124/72  01/17/22 127/82  12/31/21 98/66    She is under the care of rheumatology clinic for fibromyalgia  osteoarthritis, degenerative disc disease, chronic fatigue, osteoporosis.    She continues on cymbalta, gabapentin (also for neuropathy in feet), and robaxin '500mg'$  BID prn. They prescribe her armodafinil for her chronic fatigue.  She gets massages 1-2x/month (took a break related to her back, but has restarted).  She has pain daily but reports it is manageable most of the time. Bothers her the most to stand still (washing dishes).  She had T12 and L1 compression fractures after a fall in 01/2020. Foot got caught on a table leg when getting up from a table at a restaurant (thinks she twisted some as she fell). GYN Dr. Dellis Filbert didn't feel bone-building medication was indicated at that time (07/2020), felt due to trauma and not fragility fracture. She had worsening back pain in July/August 2022, and was found  to have new, acute compression fractures. She ultimately had kyphoplasty done 10/2021 by Dr. Kathyrn Sheriff. She was started on Forteo injection by rheum in 07/2021. DEXA in 2018 was normal.  DEXA 06/2020 T-2.0 R fem neck, osteopenia; per rheum notes, planning to recheck in Fall 2023.     Gout--she continues on allopurinol '300mg'$  daily, and hasn't had any flares in several years. She has colchicine to use prn flares. Lab Results  Component Value Date   LABURIC 4.5 11/30/2021   She has  OSA, under the care of Dr. Radford Pax. She is on CPAP.  Energy is improved, but still requires Nuvigil. She admits to still not sleeping very well at night.   She is under the care of Dr. Valaria Good for RLS, neuropathy (pedal paresthesias), tremor (benign essential vs accentuated physiologic tremor), migraines. She takes gabapentin for restless legs as well as her neuropathy pain.  He was trying to switch her from nortriptyline to oxcarbazepine, for migraine prevention, and has also helped neuropathy. Per my review of neuro's last notes, it looks like she didn't tolerate the higher doses of oxcarbazepine, so unable to get off pamelor.  She reports taking 150/'300mg'$  of oxcarbazepine and '100mg'$  of nortriptyline. She sees him again next month. RLS is well controlled most of the time, flaring a little just recently. Her headaches are much better--denies migraines, getting temporal headaches about once a week.   Depression:  Sees Reg[facility] at [facility], and is doing well on Cymbalta and Abilify. She reports moods are good.   GERD and h/o gastroparesis: Protonix was cut back to once daily after her Roux-en-Y.  Denies any nausea and vomiting, gastroparesis is much better. Vomited once this past week, but this has been very rare.  Uses zofran at least once a week for nausea. She last saw GI 04/2021.    PMH, PSH, SH reviewed  Outpatient Encounter Medications as of 02/05/2022  Medication Sig Note   acetaminophen (TYLENOL) 500 MG tablet Take 1,000 mg by mouth every 8 (eight) hours as needed for moderate pain. 02/05/2022: Last dose 9am   allopurinol (ZYLOPRIM) 300 MG tablet TAKE 1 TABLET BY MOUTH EVERY DAY    ALPRAZolam (XANAX) 0.25 MG tablet Take 1 tablet (0.25 mg total) by mouth 3 (three) times daily as needed for anxiety. 02/05/2022: Last dose this am   ARIPiprazole (ABILIFY) 5 MG tablet Take 1 tablet (5 mg total) by mouth daily.    Armodafinil 250 MG tablet Take 1 tablet (250 mg total) by mouth daily with breakfast.     aspirin 81 MG chewable tablet     atorvastatin (LIPITOR) 40 MG tablet TAKE 1 TABLET BY MOUTH EVERY DAY    CALCIUM PO Take 500 mg by mouth in the morning, at noon, and at bedtime.    cetirizine (ZYRTEC) 10 MG tablet Take 10 mg by mouth at bedtime.    DULoxetine (CYMBALTA) 60 MG capsule Take 1 capsule (60 mg total) by mouth 2 (two) times daily.    gabapentin (NEURONTIN) 300 MG capsule Take 300 mg by mouth 3 (three) times daily.    Insulin Pen Needle 31G X 5 MM MISC Use to inject Forteo once daily. DISCARD AFTER USE.    Melatonin 10 MG TABS Take 5 mg by mouth at bedtime.     metoprolol tartrate (LOPRESSOR) 50 MG tablet TAKE 1 TABLET BY MOUTH TWICE A DAY    Multiple Vitamins-Minerals (BARIATRIC MULTIVITAMINS/IRON PO) Take 2 each by mouth daily.    Multiple Vitamins-Minerals (ICAPS AREDS 2)  CAPS Take 1 capsule by mouth 2 (two) times daily.    mupirocin ointment (BACTROBAN) 2 % Apply 1 application topically daily.    nortriptyline (PAMELOR) 50 MG capsule Take 100 mg by mouth at bedtime.    OXcarbazepine (TRILEPTAL) 150 MG tablet Take 150 mg by mouth 2 (two) times daily. 06/28/2021: Taking 1 in morning, 2 in evening   pantoprazole (PROTONIX) 40 MG tablet Take 1 tablet (40 mg total) by mouth daily.    RESTASIS 0.05 % ophthalmic emulsion 1 drop 2 (two) times daily.    Teriparatide, Recombinant, (FORTEO) 600 MCG/2.4ML SOPN Inject 20 mcg into the skin daily.    [DISCONTINUED] OXcarbazepine (TRILEPTAL) 150 MG tablet Take 1 tablet by mouth 2 (two) times daily.    albuterol (VENTOLIN HFA) 108 (90 Base) MCG/ACT inhaler Inhale 2 puffs into the lungs every 6 (six) hours as needed for wheezing or shortness of breath. (Patient not taking: Reported on 02/05/2022) 02/05/2022: As needed   colchicine 0.6 MG tablet Take 1.'2mg'$  (2 tablets) then 0.'6mg'$  (1 tablet) 1 hour after. Then, take 1 tablet every day for 7 days. (Patient not taking: Reported on 02/05/2022) 02/05/2022: As needed   diphenhydrAMINE (BENADRYL) 25 MG tablet  Take 25 mg by mouth every 6 (six) hours as needed. (Patient not taking: Reported on 02/05/2022) 06/28/2021: Uses prn for itching   fluticasone (FLONASE) 50 MCG/ACT nasal spray PLACE 2 SPRAYS INTO BOTH NOSTRILS DAILY AS NEEDED FOR ALLERGIES (Patient not taking: Reported on 02/05/2022) 02/05/2022: Patient is out and needs refill   meclizine (ANTIVERT) 25 MG tablet Take 1 tablet (25 mg total) by mouth 3 (three) times daily as needed for dizziness. (Patient not taking: Reported on 02/05/2022) 02/05/2022: As needed   methocarbamol (ROBAXIN) 500 MG tablet TAKE 1 TABLET BY MOUTH EVERY DAY AS NEEDED (Patient not taking: Reported on 02/05/2022) 02/05/2022: As needed   ondansetron (ZOFRAN) 8 MG tablet Take 8 mg by mouth every 8 (eight) hours as needed for nausea. (Patient not taking: Reported on 01/17/2022) 05/02/2020: Uses prn, very rarely   promethazine (PHENERGAN) 25 MG tablet Take 25 mg by mouth as needed. (Patient not taking: Reported on 01/17/2022) 02/05/2022: As needed   [DISCONTINUED] Armodafinil 250 MG tablet Take 1 tablet by mouth daily with breakfast.    [DISCONTINUED] ondansetron (ZOFRAN-ODT) 4 MG disintegrating tablet ondansetron 4 mg disintegrating tablet (Patient not taking: Reported on 01/17/2022)    No facility-administered encounter medications on file as of 02/05/2022.   Allergies  Allergen Reactions   Erythromycin Nausea Only    Abdominal pain Other reaction(s): Unknown   Meperidine Hcl    Polyethyl Glyc-Propyl Glyc Pf    Dilaudid [Hydromorphone Hcl] Itching   ROS: no fever, chills, URI symptoms. R ear pain per HPI. No chest pain, palpitations. No abdominal pain, bowel changes.  Nausea, per HPI. No dysphagia Headaches per HPI. Fibromyalgia is flaring some Back pain is stable overall Fatigue unchanged. Moods are controlled. See HPI    PHYSICAL EXAM:  BP 124/72    Pulse 68    Ht 5' 2.5" (1.588 m)    Wt 184 lb 12.8 oz (83.8 kg)    LMP 07/27/2006    BMI 33.26 kg/m   Wt Readings from Last 3  Encounters:  02/05/22 184 lb 12.8 oz (83.8 kg)  01/17/22 180 lb (81.6 kg)  12/31/21 184 lb (83.5 kg)  wt 181# in 06/2021  Pleasant female, in good spirits, in no distress HEENT: conjunctiva and sclera are clear, EOMI, wearing mask. TM's  and EAC's are normal bilaterally. Neck: no lymphadenopathy or mass, no bruit. Heart: regular rate and rhythm Lungs: clear bilaterally, no wheezes, rales, ronchi Abdomen: soft, nontender. No organomegaly or mass Back: no spinal or CVA tenderness, no SI tenderness. Minimal tenderness over trapezius/upper back, no significant trigger points. Extremities: no edema. Wearing post-op shoe on R.  4 x 7cm firm area at left medial mid-calf area.  Only minimal discoloration here (inferior to this). Nontender, no fluctuance. Skin: normal turgor, no rash Psych: normal mood, affect, hygiene and grooming Neuro: alert and oriented. Normal gait. No significant tremor noted.    ASSESSMENT/PLAN:  Mixed hyperlipidemia - due for recheck. Cont statin, low cholesterol, lowfat diet - Plan: Lipid panel  Elevated LFTs - recheck today. Tries to limit tylenol (but cannot take NSAID), doesn't take excessive amt - Plan: Comprehensive metabolic panel  History of Roux-en-Y gastric bypass  Fibromyalgia - under care of rheum  OSA (obstructive sleep apnea)  Restless leg syndrome - under care of neuro, controlled with treatments  Migraine without status migrainosus, not intractable, unspecified migraine type - controlled/infrequent  Idiopathic peripheral neuropathy - controlled, under care of neuro  Need for COVID-19 vaccine - Plan: Pension scheme manager  Impaired fasting glucose - counseled re: proper diet, portions, weight loss, exercise (once cleared) - Plan: Comprehensive metabolic panel  Medication monitoring encounter - Plan: Comprehensive metabolic panel, Lipid panel   Allopurinol RF'd REFILL ATORVASTATIN AFTER LABS BACK--[facility]  F/u 06/2022  as scheduled for wellness visit.

## 2022-02-05 ENCOUNTER — Ambulatory Visit (INDEPENDENT_AMBULATORY_CARE_PROVIDER_SITE_OTHER): Payer: Medicare Other | Admitting: Family Medicine

## 2022-02-05 ENCOUNTER — Encounter: Payer: Self-pay | Admitting: Family Medicine

## 2022-02-05 VITALS — BP 124/72 | HR 68 | Ht 62.5 in | Wt 184.8 lb

## 2022-02-05 DIAGNOSIS — H9201 Otalgia, right ear: Secondary | ICD-10-CM

## 2022-02-05 DIAGNOSIS — E785 Hyperlipidemia, unspecified: Secondary | ICD-10-CM

## 2022-02-05 DIAGNOSIS — Z23 Encounter for immunization: Secondary | ICD-10-CM | POA: Diagnosis not present

## 2022-02-05 DIAGNOSIS — S8011XA Contusion of right lower leg, initial encounter: Secondary | ICD-10-CM

## 2022-02-05 DIAGNOSIS — R7301 Impaired fasting glucose: Secondary | ICD-10-CM

## 2022-02-05 DIAGNOSIS — Z9884 Bariatric surgery status: Secondary | ICD-10-CM | POA: Diagnosis not present

## 2022-02-05 DIAGNOSIS — G2581 Restless legs syndrome: Secondary | ICD-10-CM | POA: Diagnosis not present

## 2022-02-05 DIAGNOSIS — G609 Hereditary and idiopathic neuropathy, unspecified: Secondary | ICD-10-CM

## 2022-02-05 DIAGNOSIS — E782 Mixed hyperlipidemia: Secondary | ICD-10-CM | POA: Diagnosis not present

## 2022-02-05 DIAGNOSIS — R7989 Other specified abnormal findings of blood chemistry: Secondary | ICD-10-CM

## 2022-02-05 DIAGNOSIS — G43909 Migraine, unspecified, not intractable, without status migrainosus: Secondary | ICD-10-CM

## 2022-02-05 DIAGNOSIS — Z5181 Encounter for therapeutic drug level monitoring: Secondary | ICD-10-CM

## 2022-02-05 DIAGNOSIS — G4733 Obstructive sleep apnea (adult) (pediatric): Secondary | ICD-10-CM

## 2022-02-05 DIAGNOSIS — M797 Fibromyalgia: Secondary | ICD-10-CM

## 2022-02-05 MED ORDER — ALLOPURINOL 300 MG PO TABS: 300.0000 mg | ORAL_TABLET | Freq: Every day | ORAL | 3 refills | Status: DC

## 2022-02-06 ENCOUNTER — Telehealth: Payer: Self-pay | Admitting: Family Medicine

## 2022-02-06 ENCOUNTER — Other Ambulatory Visit: Payer: Self-pay | Admitting: Family Medicine

## 2022-02-06 LAB — COMPREHENSIVE METABOLIC PANEL
ALT: 33 IU/L — ABNORMAL HIGH (ref 0–32)
AST: 31 IU/L (ref 0–40)
Albumin/Globulin Ratio: 1.6 (ref 1.2–2.2)
Albumin: 4 g/dL (ref 3.8–4.8)
Alkaline Phosphatase: 225 IU/L — ABNORMAL HIGH (ref 44–121)
BUN/Creatinine Ratio: 24 (ref 12–28)
BUN: 17 mg/dL (ref 8–27)
Bilirubin Total: <0.2 mg/dL (ref 0.0–1.2)
CO2: 24 mmol/L (ref 20–29)
Calcium: 9.6 mg/dL (ref 8.7–10.3)
Chloride: 96 mmol/L (ref 96–106)
Creatinine, Ser: 0.72 mg/dL (ref 0.57–1.00)
Globulin, Total: 2.5 g/dL (ref 1.5–4.5)
Glucose: 101 mg/dL — ABNORMAL HIGH (ref 70–99)
Potassium: 4.3 mmol/L (ref 3.5–5.2)
Sodium: 141 mmol/L (ref 134–144)
Total Protein: 6.5 g/dL (ref 6.0–8.5)
eGFR: 93 mL/min/{1.73_m2} (ref >59)

## 2022-02-06 LAB — LIPID PANEL
Chol/HDL Ratio: 2.4 ratio (ref 0.0–4.4)
Cholesterol, Total: 117 mg/dL (ref 100–199)
HDL: 48 mg/dL (ref >39)
LDL Chol Calc (NIH): 45 mg/dL (ref 0–99)
Triglycerides: 137 mg/dL (ref 0–149)
VLDL Cholesterol Cal: 24 mg/dL (ref 5–40)

## 2022-02-06 MED ORDER — ATORVASTATIN CALCIUM 40 MG PO TABS: 40.0000 mg | ORAL_TABLET | Freq: Every day | ORAL | 3 refills | Status: DC

## 2022-02-06 NOTE — Chronic Care Management (AMB) (Signed)
?  Chronic Care Management  ? ?Note ? ?02/06/2022 ?Name: TOLA MEAS MRN: [id number] DOB: 1956-02-17 ? ?JAQUILA SANTELLI is a 66 y.o. year old female who is a primary care patient of Rita Ohara, MD. I reached out to [name] by phone today in response to a referral sent by Ms. Etheleen Sia Dsouza's PCP, Rita Ohara, MD.  ? ?Ms. Sarti was given information about Chronic Care Management services today including:  ?CCM service includes personalized support from designated clinical staff supervised by her physician, including individualized plan of care and coordination with other care providers ?24/7 contact phone numbers for assistance for urgent and routine care needs. ?Service will only be billed when office clinical staff spend 20 minutes or more in a month to coordinate care. ?Only one practitioner may furnish and bill the service in a calendar month. ?The patient may stop CCM services at any time (effective at the end of the month) by phone call to the office staff. ? ? ?Patient agreed to services and verbal consent obtained.  ? ?Follow up plan: ? ? ?Tatjana Dellinger ?[employer] Scheduler  ?

## 2022-02-07 ENCOUNTER — Other Ambulatory Visit: Payer: Self-pay | Admitting: Rheumatology

## 2022-02-07 ENCOUNTER — Other Ambulatory Visit (HOSPITAL_COMMUNITY): Payer: Self-pay

## 2022-02-07 ENCOUNTER — Other Ambulatory Visit: Payer: Self-pay | Admitting: *Deleted

## 2022-02-07 DIAGNOSIS — R748 Abnormal levels of other serum enzymes: Secondary | ICD-10-CM

## 2022-02-07 MED ORDER — ARMODAFINIL 250 MG PO TABS
ORAL_TABLET | ORAL | 0 refills | Status: DC
  Filled 2022-02-07 – 2022-02-12 (×2): qty 30, 30d supply, fill #0

## 2022-02-07 NOTE — Telephone Encounter (Signed)
Next Visit: 06/06/2022 ? ?Last Visit: 12/07/2021 ? ?Last Fill: 12/14/2021 ? ?Dx: management of chronic fatigue ? ?Current Dose per office note on 12/07/2021: armodafinil 250 mg daily with breakfast. ? ?Okay to refill Armodafinil?   ?

## 2022-02-08 ENCOUNTER — Other Ambulatory Visit: Payer: Self-pay | Admitting: Medical

## 2022-02-12 ENCOUNTER — Other Ambulatory Visit (HOSPITAL_COMMUNITY): Payer: Self-pay

## 2022-02-15 ENCOUNTER — Ambulatory Visit: Payer: Medicare Other | Admitting: Podiatry

## 2022-02-15 ENCOUNTER — Other Ambulatory Visit: Payer: Self-pay

## 2022-02-15 DIAGNOSIS — M7661 Achilles tendinitis, right leg: Secondary | ICD-10-CM

## 2022-02-15 DIAGNOSIS — M2041 Other hammer toe(s) (acquired), right foot: Secondary | ICD-10-CM

## 2022-02-15 NOTE — Patient Instructions (Signed)
Achilles Tendinitis  ?with Rehab ?Achilles tendinitis is a disorder of the Achilles tendon. The Achilles tendon connects the large calf muscles (Gastrocnemius and Soleus) to the heel bone (calcaneus). This tendon is sometimes called the heel cord. It is important for pushing-off and standing on your toes and is important for walking, running, or jumping. Tendinitis is often caused by overuse and repetitive microtrauma. ?SYMPTOMS ?Pain, tenderness, swelling, warmth, and redness may occur over the Achilles tendon even at rest. ?Pain with pushing off, or flexing or extending the ankle. ?Pain that is worsened after or during activity. ?CAUSES  ?Overuse sometimes seen with rapid increase in exercise programs or in sports requiring running and jumping. ?Poor physical conditioning (strength and flexibility or endurance). ?Running sports, especially training running down hills. ?Inadequate warm-up before practice or play or failure to stretch before participation. ?Injury to the tendon. ?PREVENTION  ?Warm up and stretch before practice or competition. ?Allow time for adequate rest and recovery between practices and competition. ?Keep up conditioning. ?Keep up ankle and leg flexibility. ?Improve or keep muscle strength and endurance. ?Improve cardiovascular fitness. ?Use proper technique. ?Use proper equipment (shoes, skates). ?To help prevent recurrence, taping, protective strapping, or an adhesive bandage may be recommended for several weeks after healing is complete. ?PROGNOSIS  ?Recovery may take weeks to several months to heal. ?Longer recovery is expected if symptoms have been prolonged. ?Recovery is usually quicker if the inflammation is due to a direct blow as compared with overuse or sudden strain. ?RELATED COMPLICATIONS  ?Healing time will be prolonged if the condition is not correctly treated. The injury must be given plenty of time to heal. ?Symptoms can reoccur if activity is resumed too soon. ?Untreated,  tendinitis may increase the risk of tendon rupture requiring additional time for recovery and possibly surgery. ?TREATMENT  ?The first treatment consists of rest anti-inflammatory medication, and ice to relieve the pain. ?Stretching and strengthening exercises after resolution of pain will likely help reduce the risk of recurrence. Referral to a physical therapist or athletic trainer for further evaluation and treatment may be helpful. ?A walking boot or cast may be recommended to rest the Achilles tendon. This can help break the cycle of inflammation and microtrauma. ?Arch supports (orthotics) may be prescribed or recommended by your caregiver as an adjunct to therapy and rest. ?Surgery to remove the inflamed tendon lining or degenerated tendon tissue is rarely necessary and has shown less than predictable results. ?MEDICATION  ?Nonsteroidal anti-inflammatory medications, such as aspirin and ibuprofen, may be used for pain and inflammation relief. Do not take within 7 days before surgery. Take these as directed by your caregiver. Contact your caregiver immediately if any bleeding, stomach upset, or signs of allergic reaction occur. Other minor pain relievers, such as acetaminophen, may also be used. ?Pain relievers may be prescribed as necessary by your caregiver. Do not take prescription pain medication for longer than 4 to 7 days. Use only as directed and only as much as you need. ?Cortisone injections are rarely indicated. Cortisone injections may weaken tendons and predispose to rupture. It is better to give the condition more time to heal than to use them. ?HEAT AND COLD ?Cold is used to relieve pain and reduce inflammation for acute and chronic Achilles tendinitis. Cold should be applied for 10 to 15 minutes every 2 to 3 hours for inflammation and pain and immediately after any activity that aggravates your symptoms. Use ice packs or an ice massage. ?Heat may be used before performing stretching  and  strengthening activities prescribed by your caregiver. Use a heat pack or a warm soak. ?SEEK MEDICAL CARE IF: ?Symptoms get worse or do not improve in 2 weeks despite treatment. ?New, unexplained symptoms develop. Drugs used in treatment may produce side effects. ? ?EXERCISES: ? ?RANGE OF MOTION (ROM) AND STRETCHING EXERCISES - Achilles Tendinitis  ?These exercises may help you when beginning to rehabilitate your injury. Your symptoms may resolve with or without further involvement from your physician, physical therapist or athletic trainer. While completing these exercises, remember:  ?Restoring tissue flexibility helps normal motion to return to the joints. This allows healthier, less painful movement and activity. ?An effective stretch should be held for at least 30 seconds. ?A stretch should never be painful. You should only feel a gentle lengthening or release in the stretched tissue. ? ?STRETCH  Gastroc, Standing  ?Place hands on wall. ?Extend right / left leg, keeping the front knee somewhat bent. ?Slightly point your toes inward on your back foot. ?Keeping your right / left heel on the floor and your knee straight, shift your weight toward the wall, not allowing your back to arch. ?You should feel a gentle stretch in the right / left calf. Hold this position for 10 seconds. ?Repeat 3 times. Complete this stretch 2 times per day. ? ?STRETCH  Soleus, Standing  ?Place hands on wall. ?Extend right / left leg, keeping the other knee somewhat bent. ?Slightly point your toes inward on your back foot. ?Keep your right / left heel on the floor, bend your back knee, and slightly shift your weight over the back leg so that you feel a gentle stretch deep in your back calf. ?Hold this position for 10 seconds. ?Repeat 3 times. Complete this stretch 2 times per day. ? ?Google, Standing  ?Note: This exercise can place a lot of stress on your foot and ankle. Please complete this exercise only if specifically  instructed by your caregiver.  ?Place the ball of your right / left foot on a step, keeping your other foot firmly on the same step. ?Hold on to the wall or a rail for balance. ?Slowly lift your other foot, allowing your body weight to press your heel down over the edge of the step. ?You should feel a stretch in your right / left calf. ?Hold this position for 10 seconds. ?Repeat this exercise with a slight bend in your knee. ?Repeat 3 times. Complete this stretch 2 times per day.  ? ?STRENGTHENING EXERCISES - Achilles Tendinitis ?These exercises may help you when beginning to rehabilitate your injury. They may resolve your symptoms with or without further involvement from your physician, physical therapist or athletic trainer. While completing these exercises, remember:  ?Muscles can gain both the endurance and the strength needed for everyday activities through controlled exercises. ?Complete these exercises as instructed by your physician, physical therapist or athletic trainer. Progress the resistance and repetitions only as guided. ?You may experience muscle soreness or fatigue, but the pain or discomfort you are trying to eliminate should never worsen during these exercises. If this pain does worsen, stop and make certain you are following the directions exactly. If the pain is still present after adjustments, discontinue the exercise until you can discuss the trouble with your clinician. ? ?STRENGTH - Plantar-flexors  ?Sit with your right / left leg extended. Holding onto both ends of a rubber exercise band/tubing, loop it around the ball of your foot. Keep a slight tension in the band. ?Slowly  push your toes away from you, pointing them downward. ?Hold this position for 10 seconds. Return slowly, controlling the tension in the band/tubing. ?Repeat 3 times. Complete this exercise 2 times per day.  ? ?STRENGTH - Plantar-flexors  ?Stand with your feet shoulder width apart. Steady yourself with a wall or table  using as little support as needed. ?Keeping your weight evenly spread over the width of your feet, rise up on your toes.* ?Hold this position for 10 seconds. ?Repeat 3 times. Complete this exercise 2 times per day.  ?

## 2022-02-18 NOTE — Progress Notes (Signed)
?  Subjective:  ?Patient ID: [name], female    DOB: [birth year],  MRN: [id number] ? ?Chief Complaint  ?Patient presents with  ? Foot Ulcer  ?    wound care, f/u tenotomy and removal of nail .  ? Hammer Toe  ? Nail Problem  ? ? ?66 y.o. female presents with the above complaint. History confirmed with patient.  She is having new pain in the Achilles tendon, the toe is doing well ? ?Objective:  ?Physical Exam: ?warm, good capillary refill, no trophic changes or ulcerative lesions, normal DP and PT pulses, normal sensory exam, and ulcer at the tip of the toe has completely healed.  She has pain and tenderness in the mid substance of the Achilles tendon ?Assessment:  ? ?1. Achilles tendinitis, right leg   ? ? ? ?Plan:  ?Patient was evaluated and treated and all questions answered. ? ?Tenotomy has done very well has completely healed the ulcer.  I think after the next visit we can plan for surgical correction of her IPJ arthritis now that she is ulcer free ? ?Today she had a new issue of Achilles tendinitis. Discussed the etiology and treatment options for Achilles tendinitis including stretching, formal physical therapy with an eccentric exercises therapy plan, supportive shoegears such as a running shoe or sneaker, heel lifts, topical and oral medications.  We also discussed that I do not routinely perform injections in this area because of the risk of an increased damage or rupture of the tendon.  We also discussed the role of surgical treatment of this for patients who do not improve after exhausting non-surgical treatment options. ? ?-XR reviewed with patient ?-Educated on stretching and icing of the affected limb. ? ? ?Return in about 3 weeks (around 03/08/2022) for surgery planning visit .  ? ?

## 2022-02-19 ENCOUNTER — Telehealth: Payer: Self-pay | Admitting: Pharmacist

## 2022-02-19 NOTE — Chronic Care Management (AMB) (Signed)
? ? ?Chronic Care Management ?Pharmacy Assistant  ? ?Name: TAMIE MINTEER  MRN: [id number] DOB: 1956/05/24 ? ?Reason for Encounter: Chart Prep for initial encounter with Jeni Salles on 02/22/22 at 21 am with [facility] Pharmacist in office. ?  ?Conditions to be addressed/monitored: ?HTN, HLD, Anxiety, Depression, GERD, Allergic Rhinitis, and Osteoarthritis ? ?Recent office visits:  ?02/05/22 Rita Ohara, MD - Patient presented for Mixed hyperlipidemia and other concerns. Changed Armodafinil Increased Oxcarbazepine, Stopped Zofran. ? ?01/17/22 Tysinger, Camelia Eng, PA-C - Patient presented for Subacute cough and other concerns. Prescribed Albuterol, Prescribed Amoxicillin. Stopped Doxycycline, Hydrocodone- Acetaminophen, Lidocaine, Oxycodone - Acetaminophen, Polyethyl Glycol, Probiotic, Prochlorperazine and Propylene Glycol. ? ?Recent consult visits:  ?02/15/22 Criselda Peaches, DPM (Podiatry) - Patient presented for Achilles tendinitis and other concerns. No medication changes. Recommended stretching and icing. ? ?01/25/22 Criselda Peaches, DPM (Podiatry) - Patient presented for skin ulcer of right foot with fat layer exposed and other concerns. No medication changes. ? ?01/10/22 Lorenda Peck, MD (Podiatry) - Patient presented for Ankle injury, left. No medication changes. ? ?01/04/22 Criselda Peaches, DPM (Podiatry) - Patient presented for skin ulcer of right foot with fat layer exposed and other concerns.  Prescribed Tramadol. ? ?12/12/21 Criselda Peaches, DPM (Podiatry) - Patient presented for skin ulcer of right foot with fat layer exposed and other concerns. No medication changes. ? ?12/07/21 Ofilia Neas, PA-C (Rheumatology) - Patient presented for Idiopathic chronic gout of multiple sites without tophus and other concerns. No medication changes. ? ?11/30/21 Criselda Peaches, DPM (Podiatry) - Patient presented for skin ulcer of right foot with fat layer exposed and other concerns. No medication  changes. ? ?11/07/21 Criselda Peaches, DPM (Podiatry) - Patient presented for skin ulcer of right foot with fat layer exposed and other concerns. Prescribed Mupirocin 2%.  ? ?10/26/21 Consuella Lose (Neurosurgery) - Patient presented for Age- related osteoporosis with current pathological fracture. No other visit details available. ? ?10/13/21 Consuella Lose (Neurosurgery) - Patient presented for Wedge compression fracture. X-rays done, No other visit details available. ? ?10/05/21 Marijo Sanes, PT (PT) - Patient presented for Pain in thoracic spine and other concerns. No medication changes. ? ?10/04/21 Consuella Lose (Neurosurgery) - Patient presented for low back pain. No medication changes. ? ?09/26/21 Criselda Peaches, DPM (Podiatry) - Patient presented for swelling of toe of right foot and other concerns. No medication changes. ? ?09/18/21 Patient presented to [facility] for Flu Vaccine  ? ?09/12/21 Melina Schools (Orthopedic Surg) - Patient presented for Collapsed vertebra and other concerns. No other visit details available. ? ?09/08/21 [place], [facility]) - Patient presented for Insomnia. No other visit details available. ? ?08/24/21 Criselda Peaches, DPM (Podiatry) - Patient presented for Bone cyst of foot and other concerns. Prescribed Doxycycline. Increased Colchicine.  ? ?Hospital visits:  ?Medication Reconciliation was completed by comparing discharge summary, patient?s EMR and Pharmacy list, and upon discussion with patient. ? ?Patient presented to [facility] on 12/31/21 due to Contusion of left lower leg. Patient was present for 4 hours. ? ?New?Medications Started at [facility] Discharge:?? ?-started  ?none ? ?Medication Changes at Hospital Discharge: ?-Changed  ?none ? ?Medications Discontinued at Hospital Discharge: ?-Stopped  ?none ? ?Medications that remain the same after Hospital Discharge:??  ?-All other medications will remain the same.    ? ?Medications: ?Outpatient Encounter Medications as of 02/19/2022  ?Medication Sig Note  ? acetaminophen (TYLENOL) 500 MG tablet Take 1,000 mg by  mouth every 8 (eight) hours as needed for moderate pain. 02/05/2022: Last dose 9am  ? albuterol (VENTOLIN HFA) 108 (90 Base) MCG/ACT inhaler TAKE 2 PUFFS BY MOUTH EVERY 6 HOURS AS NEEDED FOR WHEEZE OR SHORTNESS OF BREATH   ? allopurinol (ZYLOPRIM) 300 MG tablet Take 1 tablet (300 mg total) by mouth daily.   ? ALPRAZolam (XANAX) 0.25 MG tablet Take 1 tablet (0.25 mg total) by mouth 3 (three) times daily as needed for anxiety. 02/05/2022: Last dose this am  ? ARIPiprazole (ABILIFY) 5 MG tablet Take 1 tablet (5 mg total) by mouth daily.   ? Armodafinil 250 MG tablet Take 1 tablet (250 mg total) by mouth daily with breakfast.   ? Armodafinil 250 MG tablet Take 1 tablet by mouth daily with breakfast.   ? aspirin 81 MG chewable tablet    ? atorvastatin (LIPITOR) 40 MG tablet Take 1 tablet (40 mg total) by mouth daily.   ? CALCIUM PO Take 500 mg by mouth in the morning, at noon, and at bedtime.   ? cetirizine (ZYRTEC) 10 MG tablet Take 10 mg by mouth at bedtime.   ? colchicine 0.6 MG tablet Take 1.'2mg'$  (2 tablets) then 0.'6mg'$  (1 tablet) 1 hour after. Then, take 1 tablet every day for 7 days. (Patient not taking: Reported on 02/05/2022) 02/05/2022: As needed  ? diphenhydrAMINE (BENADRYL) 25 MG tablet Take 25 mg by mouth every 6 (six) hours as needed. (Patient not taking: Reported on 02/05/2022) 06/28/2021: Uses prn for itching  ? DULoxetine (CYMBALTA) 60 MG capsule Take 1 capsule (60 mg total) by mouth 2 (two) times daily.   ? fluticasone (FLONASE) 50 MCG/ACT nasal spray PLACE 2 SPRAYS INTO BOTH NOSTRILS DAILY AS NEEDED FOR ALLERGIES (Patient not taking: Reported on 02/05/2022) 02/05/2022: Patient is out and needs refill  ? gabapentin (NEURONTIN) 300 MG capsule Take 300 mg by mouth 3 (three) times daily.   ? Insulin Pen Needle 31G X 5 MM MISC Use to inject Forteo once daily. DISCARD AFTER  USE.   ? meclizine (ANTIVERT) 25 MG tablet Take 1 tablet (25 mg total) by mouth 3 (three) times daily as needed for dizziness. (Patient not taking: Reported on 02/05/2022) 02/05/2022: As needed  ? Melatonin 10 MG TABS Take 5 mg by mouth at bedtime.    ? methocarbamol (ROBAXIN) 500 MG tablet TAKE 1 TABLET BY MOUTH EVERY DAY AS NEEDED (Patient not taking: Reported on 02/05/2022) 02/05/2022: As needed  ? metoprolol tartrate (LOPRESSOR) 50 MG tablet TAKE 1 TABLET BY MOUTH TWICE A DAY   ? Multiple Vitamins-Minerals (BARIATRIC MULTIVITAMINS/IRON PO) Take 2 each by mouth daily.   ? Multiple Vitamins-Minerals (ICAPS AREDS 2) CAPS Take 1 capsule by mouth 2 (two) times daily.   ? mupirocin ointment (BACTROBAN) 2 % Apply 1 application topically daily.   ? nortriptyline (PAMELOR) 50 MG capsule Take 100 mg by mouth at bedtime.   ? ondansetron (ZOFRAN) 8 MG tablet Take 8 mg by mouth every 8 (eight) hours as needed for nausea. (Patient not taking: Reported on 01/17/2022) 05/02/2020: Uses prn, very rarely  ? OXcarbazepine (TRILEPTAL) 150 MG tablet Take 150 mg by mouth 2 (two) times daily. 06/28/2021: Taking 1 in morning, 2 in evening  ? pantoprazole (PROTONIX) 40 MG tablet Take 1 tablet (40 mg total) by mouth daily.   ? promethazine (PHENERGAN) 25 MG tablet Take 25 mg by mouth as needed. (Patient not taking: Reported on 01/17/2022) 02/05/2022: As needed  ? RESTASIS 0.05 % ophthalmic emulsion  1 drop 2 (two) times daily.   ? Teriparatide, Recombinant, (FORTEO) 600 MCG/2.4ML SOPN Inject 20 mcg into the skin daily.   ? ?No facility-administered encounter medications on file as of 02/19/2022.  ?Fill History : ?ALBUTEROL HFA 90 MCG INHALER 02/08/2022 30  ? ?ALLOPURINOL 300 MG TABLET 02/05/2022 90  ? ?ALPRAZOLAM 0.25 MG TABLET 01/08/2022 30  ? ?ARIPIPRAZOLE 5 MG TABLET 01/11/2022 90  ? ?Armodafinil 250 MG tablet 02/12/2022 30  ? ?ATORVASTATIN 40 MG TABLET 02/06/2022 90  ? ?COLCHICINE  0.6 MG TABS 08/24/2021 7  ? ?DULOXETINE HCL DR 60 MG CAP  01/11/2022 90  ? ?FLUTICASONE PROPIONATE  50 MCG/ACT SUSP 11/14/2020 84  ? ?GABAPENTIN 300 MG CAPSULE 01/15/2022 90  ? ?BD UF MINI PEN NEEDLE 5MMX31G 10/22/2021 90  ? ?MECLIZINE 25 MG TABLET 07/22/2019 10  ? ?METHOCARBAMOL 500

## 2022-02-22 ENCOUNTER — Ambulatory Visit (INDEPENDENT_AMBULATORY_CARE_PROVIDER_SITE_OTHER): Payer: Medicare Other | Admitting: Pharmacist

## 2022-02-22 VITALS — BP 108/68

## 2022-02-22 DIAGNOSIS — F324 Major depressive disorder, single episode, in partial remission: Secondary | ICD-10-CM

## 2022-02-22 DIAGNOSIS — E785 Hyperlipidemia, unspecified: Secondary | ICD-10-CM

## 2022-02-22 NOTE — Progress Notes (Signed)
? ?Chronic Care Management ?Pharmacy Note ? ?02/23/2022 ?Name:  [name] MRN:  [id number] DOB:  May 21, 1956 ? ?Summary: ?BP at goal < 130/80 ?LDL at goal < 70 ?Pt still struggles with daytime fatigue and insomnia ? ? ?Recommendations/Changes made from today's visit: ?-Recommended to start checking blood pressure at least once a week at home ?-Recommended aiming for 2 servings of dairy per day ?-Recommended taking allergy medications (cetirizine and Flonase) as needed or seasonally ?-Recommend trial of decreasing to duloxetine 60 mg per day due to possibly contribution to insomnia ? ?Plan: ?PAP for Restasis ?Follow up in 2 months for BP assessment ?Follow up in 4 months ? ? ?Subjective: ?[name] is an 66 y.o. year old female who is a primary patient of Rita Ohara, MD.  The CCM team was consulted for assistance with disease management and care coordination needs.   ? ?Engaged with patient face to face for initial visit in response to provider referral for pharmacy case management and/or care coordination services.  ? ?Consent to Services:  ?The patient was given the following information about Chronic Care Management services today, agreed to services, and gave verbal consent: 1. CCM service includes personalized support from designated clinical staff supervised by the primary care provider, including individualized plan of care and coordination with other care providers 2. 24/7 contact phone numbers for assistance for urgent and routine care needs. 3. Service will only be billed when office clinical staff spend 20 minutes or more in a month to coordinate care. 4. Only one practitioner may furnish and bill the service in a calendar month. 5.The patient may stop CCM services at any time (effective at the end of the month) by phone call to the office staff. 6. The patient will be responsible for cost sharing (co-pay) of up to 20% of the service fee (after annual deductible is met). Patient agreed to services and  consent obtained. ? ?Patient Care Team: ?Rita Ohara, MD as PCP - General (Family Medicine) ?Josue Hector, MD as PCP - Cardiology (Cardiology) ?Sueanne Margarita, MD as PCP - Sleep Medicine (Cardiology) ?Viona Gilmore, [facility] as Pharmacist (Pharmacist) ? ?Recent office visits: ?02/05/22 Rita Ohara, MD - Patient presented for Mixed hyperlipidemia and other concerns. Changed Armodafinil Increased Oxcarbazepine, Stopped Zofran. ?  ?01/17/22 Tysinger, Camelia Eng, PA-C - Patient presented for Subacute cough and other concerns. Prescribed Albuterol, Prescribed Amoxicillin. Stopped Doxycycline, Hydrocodone- Acetaminophen, Lidocaine, Oxycodone - Acetaminophen, Polyethyl Glycol, Probiotic, Prochlorperazine and Propylene Glycol. ? ?Recent consult visits: ?02/15/22 Criselda Peaches, DPM (Podiatry) - Patient presented for Achilles tendinitis and other concerns. No medication changes. Recommended stretching and icing. ?  ?01/25/22 Criselda Peaches, DPM (Podiatry) - Patient presented for skin ulcer of right foot with fat layer exposed and other concerns. No medication changes. ?  ?01/10/22 Lorenda Peck, MD (Podiatry) - Patient presented for Ankle injury, left. No medication changes. ?  ?01/04/22 Criselda Peaches, DPM (Podiatry) - Patient presented for skin ulcer of right foot with fat layer exposed and other concerns.  Prescribed Tramadol. ?  ?12/12/21 Criselda Peaches, DPM (Podiatry) - Patient presented for skin ulcer of right foot with fat layer exposed and other concerns. No medication changes. ?  ?12/07/21 Ofilia Neas, PA-C (Rheumatology) - Patient presented for Idiopathic chronic gout of multiple sites without tophus and other concerns. No medication changes. ?  ?11/30/21 Criselda Peaches, DPM (Podiatry) - Patient presented for skin ulcer of right foot with fat layer exposed and other  concerns. No medication changes. ?  ?11/07/21 Criselda Peaches, DPM (Podiatry) - Patient presented for skin ulcer of right foot with fat layer exposed  and other concerns. Prescribed Mupirocin 2%.  ?  ?10/26/21 Consuella Lose (Neurosurgery) - Patient presented for Age- related osteoporosis with current pathological fracture. No other visit details available. ?  ?10/13/21 Consuella Lose (Neurosurgery) - Patient presented for Wedge compression fracture. X-rays done, No other visit details available. ?  ?10/05/21 Marijo Sanes, PT (PT) - Patient presented for Pain in thoracic spine and other concerns. No medication changes. ?  ?10/04/21 Consuella Lose (Neurosurgery) - Patient presented for low back pain. No medication changes. ?  ?09/26/21 Criselda Peaches, DPM (Podiatry) - Patient presented for swelling of toe of right foot and other concerns. No medication changes. ?  ?09/18/21 Patient presented to [facility] for Flu Vaccine  ?  ?09/12/21 Melina Schools (Orthopedic Surg) - Patient presented for Collapsed vertebra and other concerns. No other visit details available. ?  ?09/08/21 [place], [facility]) - Patient presented for Insomnia. No other visit details available. ?  ?08/24/21 Criselda Peaches, DPM (Podiatry) - Patient presented for Bone cyst of foot and other concerns. Prescribed Doxycycline. Increased Colchicine.  ? ?Hospital visits: ?Medication Reconciliation was completed by comparing discharge summary, patient?s EMR and Pharmacy list, and upon discussion with patient. ?  ?Patient presented to [facility] on 12/31/21 due to Contusion of left lower leg. Patient was present for 4 hours. ?  ?New?Medications Started at [facility] Discharge:?? ?-started  ?none ?  ?Medication Changes at Hospital Discharge: ?-Changed  ?none ?  ?Medications Discontinued at Hospital Discharge: ?-Stopped  ?none ?  ?Medications that remain the same after Hospital Discharge:??  ?-All other medications will remain the same.  ? ? ?Objective: ? ?Lab Results  ?Component Value Date  ? CREATININE 0.72 02/05/2022  ? BUN 17 02/05/2022  ? EGFR 93  02/05/2022  ? GFRNONAA >60 01/22/2021  ? GFRAA 81 11/07/2020  ? NA 141 02/05/2022  ? K 4.3 02/05/2022  ? CALCIUM 9.6 02/05/2022  ? CO2 24 02/05/2022  ? GLUCOSE 101 (H) 02/05/2022  ? ? ?Lab Results  ?Component Value Date/Time  ? HGBA1C 5.7 (A) 06/28/2021 10:04 AM  ? HGBA1C 6.0 (H) 01/22/2021 04:57 AM  ? HGBA1C 5.5 11/07/2020 10:29 AM  ?  ?Last diabetic Eye exam: No results found for: HMDIABEYEEXA  ?Last diabetic Foot exam: No results found for: HMDIABFOOTEX  ? ?Lab Results  ?Component Value Date  ? CHOL 117 02/05/2022  ? HDL 48 02/05/2022  ? LDLCALC 45 02/05/2022  ? TRIG 137 02/05/2022  ? CHOLHDL 2.4 02/05/2022  ? ? ? ?  Latest Ref Rng & Units 02/05/2022  ? 12:04 PM 07/05/2021  ?  9:39 AM 06/28/2021  ? 10:43 AM  ?Hepatic Function  ?Total Protein 6.0 - 8.5 g/dL 6.5   7.1   6.3    ?Albumin 3.8 - 4.8 g/dL 4.0    3.9    ?AST 0 - 40 IU/L 31    30    ?ALT 0 - 32 IU/L 33    30    ?Alk Phosphatase 44 - 121 IU/L 225    138    ?Total Bilirubin 0.0 - 1.2 mg/dL <0.2    0.2    ? ? ?Lab Results  ?Component Value Date/Time  ? TSH 2.406 01/22/2021 04:57 AM  ? TSH 1.632 12/06/2017 04:36 PM  ? TSH 1.88 01/21/2017 11:18 AM  ?  TSH 2.24 07/25/2015 12:09 PM  ? FREET4 0.93 07/25/2015 12:09 PM  ? ? ? ?  Latest Ref Rng & Units 11/30/2021  ? 12:21 PM 08/24/2021  ?  3:22 PM 06/28/2021  ? 10:43 AM  ?CBC  ?WBC 3.4 - 10.8 x10E3/uL 8.8   8.3   8.3    ?Hemoglobin 11.1 - 15.9 g/dL 12.4   12.6   13.0    ?Hematocrit 34.0 - 46.6 % 37.6   38.2   39.7    ?Platelets 150 - 450 x10E3/uL 345   384   370    ? ? ?Lab Results  ?Component Value Date/Time  ? VD25OH 60 07/05/2021 09:39 AM  ? VD25OH 58.6 05/02/2020 11:16 AM  ? ? ?Clinical ASCVD: No  ?The ASCVD Risk score (Arnett DK, et al., 2019) failed to calculate for the following reasons: ?  The valid total cholesterol range is 130 to 320 mg/dL   ? ? ?  01/17/2022  ? 10:07 AM 06/28/2021  ? 10:03 AM 01/26/2021  ? 11:08 AM  ?Depression screen PHQ 2/9  ?Decreased Interest 0 1 0  ?Down, Depressed, Hopeless 0 0 0  ?PHQ - 2 Score  0 1 0  ?Altered sleeping 0 2   ?Tired, decreased energy 0 1   ?Change in appetite 0 1   ?Feeling bad or failure about yourself  1 1   ?Trouble concentrating 0 0   ?Moving slowly or fidgety/restless 0

## 2022-02-23 DIAGNOSIS — F324 Major depressive disorder, single episode, in partial remission: Secondary | ICD-10-CM

## 2022-02-23 DIAGNOSIS — E785 Hyperlipidemia, unspecified: Secondary | ICD-10-CM | POA: Diagnosis not present

## 2022-02-23 NOTE — Patient Instructions (Addendum)
Hi [place], ? ?It was great to get to meet you in person! Below is a summary of some of the topics we discussed.  ? ?Don't forget to try to get back in the habit of exercising regularly and work on checking your blood pressure at least once a week. ? ?Also make sure to aim for 2 servings of diary every day like we discussed. ? ?Please reach out to me if you have any questions or need anything before I reach back out! ? ?Best, ?Maddie ? ?Jeni Salles, PharmD, BCACP ?Clinical Pharmacist ?[place] ?[phone] ? ? Visit Information ? ? Goals Addressed   ? ?  ?  ?  ?  ? This Visit's Progress  ?  Track and Manage My Blood Pressure-Hypertension     ?  Timeframe:  Long-Range Goal ?Priority:  High ?Start Date:                             ?Expected End Date:                      ? ?Follow Up Date 05/25/22  ?  ?- check blood pressure weekly ?- choose a place to take my blood pressure (home, clinic or office, retail store) ?- write blood pressure results in a log or diary  ?  ?Why is this important?   ?You won't feel high blood pressure, but it can still hurt your blood vessels.  ?High blood pressure can cause heart or kidney problems. It can also cause a stroke.  ?Making lifestyle changes like losing a little weight or eating less salt will help.  ?Checking your blood pressure at home and at different times of the day can help to control blood pressure.  ?If the doctor prescribes medicine remember to take it the way the doctor ordered.  ?Call the office if you cannot afford the medicine or if there are questions about it.   ?  ?Notes:  ?  ? ?  ? ?Patient Care Plan: [facility]  ?  ? ?Problem Identified: Problem: Hypertension, Hyperlipidemia, Depression, Anxiety, Osteoporosis, Gout, and Neuropathy   ?  ? ?Long-Range Goal: Patient-Specific Goal   ?Start Date: 02/22/2022  ?Expected End Date: 02/23/2023  ?This Visit's Progress: On track  ?Priority: High  ?Note:   ?Current Barriers:  ?Unable to  independently monitor therapeutic efficacy ?Suboptimal therapeutic regimen for depression/anxiety ? ?Pharmacist Clinical Goal(s):  ?Patient will achieve adherence to monitoring guidelines and medication adherence to achieve therapeutic efficacy through collaboration with PharmD and provider.  ? ?Interventions: ?1:1 collaboration with Rita Ohara, MD regarding development and update of comprehensive plan of care as evidenced by provider attestation and co-signature ?Inter-disciplinary care team collaboration (see longitudinal plan of care) ?Comprehensive medication review performed; medication list updated in electronic medical record ? ?Hypertension (BP goal <130/80) ?-Controlled ?-Current treatment: ?Metoprolol tartrate 50 mg 1 tablet twice daily - Appropriate, Effective, Safe, Accessible ?-Medications previously tried: n/a  ?-Current home readings: 135/85 pulse 102; 123/80 HR 83; doesn't check often (arm cuff)  ?-Current dietary habits: doesn't use salt much; does look at package labels, no frozen meals  ?-Current exercise habits: minimal exercise right now with foot ?-Denies hypotensive/hypertensive symptoms ?-Educated on BP goals and benefits of medications for prevention of heart attack, stroke and kidney damage; ?Exercise goal of 150 minutes per week; ?Importance of home blood pressure monitoring; ?Proper BP monitoring technique; ?Symptoms of hypotension  and importance of maintaining adequate hydration; ?-Counseled to monitor BP at home weekly, document, and provide log at future appointments ?-Counseled on diet and exercise extensively ?Recommended to continue current medication ?Recommended to start checking blood pressure at least once a week at home. ? ?Hyperlipidemia: (LDL goal < 70) ?-Controlled ?-Current treatment: ?Atorvastatin 40 mg 1 tablet daily  - Appropriate, Effective, Safe, Accessible ?-Medications previously tried: none  ?-Current dietary patterns: normally not using fried; air fryer; cooks with  olive oil  ?-Current exercise habits: not exercising much lately with foot problems ?-Educated on Cholesterol goals;  ?Benefits of statin for ASCVD risk reduction; ?Importance of limiting foods high in cholesterol; ?Exercise goal of 150 minutes per week; ?-Counseled on diet and exercise extensively ?Recommended to continue current medication ? ?Depression/Anxiety (Goal: minimize symptoms) ?-Not ideally controlled ?-Current treatment: ?Duloxetine 60 mg 1 capsule twice daily - Appropriate, Query effective, Query Safe, Accessible ?Alprazolam 0.25 mg 1 tablet three times daily as needed  - doesn't take it TID - Appropriate, Effective, Query Safe, Accessible ?Nortriptyline 50 mg 2 capsules at bedtime - Appropriate, Effective, Query Safe, Accessible ?Abilify 5 mg 1 tablet daily - Appropriate, Effective, Safe, Accessible ?-Medications previously tried/failed: unknown ?-PHQ9: 1 ?-GAD7: n/a ?-Educated on Benefits of medication for symptom control ?Benefits of cognitive-behavioral therapy with or without medication ?-Recommended to continue current medication ?Recommended decreasing duloxetine to 60 mg per day due to risk of worsening insomnia. ? ?Tremor (Goal: minimize symptoms) ?-Not ideally controlled ?-Current treatment  ?Nortriptyline  50 mg 2 capsules at bedtime (bottle reads 2-3 capsules) - Appropriate, Query effective, Query Safe, Accessible ?Oxcarbazepine 150 mg 1 tablet twice a daily (she is taking 1 in morning and 2 in evening) - Appropriate, Query effective, Safe, Accessible ?-Medications previously tried: unknown  ?-Recommended to discuss with Dr. Dwaine Gale about further taper of nortriptyline. ? ?Insomnia (Goal: improve quality and quantity of sleep) ?-Not ideally controlled ?-Current treatment  ?Melatonin 5 mg 1 tablet daily - Appropriate, Effective, Safe, Accessible ?-Medications previously tried: none  ?-Counseled on practicing good sleep hygiene by setting a sleep schedule and maintaining it, avoid excessive  napping, following a nightly routine, avoiding screen time for 30-60 minutes before going to bed, and making the bedroom a cool, quiet and dark space ?Recommended not exceeding 5 mg per night of melatonin. ? ?Chronic fatigue (Goal: minimize fatigue) ?-Not ideally controlled ?-Current treatment  ?Armodafinil 250 mg 1 tablet daily - Appropriate, Effective, Safe, Query accessible ?-Medications previously tried: none  ?-Recommended to continue current medication ? ?Osteopenia (Goal prevent fractures) ?-Controlled ?-Last DEXA Scan: 2021  ? T-Score femoral neck: -2.0 ? T-Score total hip: -0.9 ? T-Score lumbar spine: -0.7 ? T-Score forearm radius: n/a ? 10-year probability of major osteoporotic fracture: n/a ? 10-year probability of hip fracture: n/a ?-Patient is a candidate for pharmacologic treatment due to history of vertebral fracture ?-Current treatment  ?Forteo inject 20 mcg into the skin daily -  Appropriate, Effective, Safe, Accessible ?Calcium citrate 300 mg 1 tablet three times a day -Appropriate, Effective, Safe, Accessible ?Multivitamin (2000 units) per 2 tablets - 1 tablet daily - Appropriate, Effective, Safe, Accessible ?Vitamin D 1000 units - Appropriate, Effective, Safe, Accessible ?-Medications previously tried: n/a  ?-Recommend [phone] units of vitamin D daily. Recommend 1200 mg of calcium daily from dietary and supplemental sources. Recommend weight-bearing and muscle strengthening exercises for building and maintaining bone density. ?-Counseled on diet and exercise extensively ?Recommended to continue current medication ?Counseled on aiming for 2 servings of dairy per  day. ? ?Pain/muscle spasms (Goal: minimize pain) ?-Controlled ?-Current treatment  ?Acetaminophen 500 mg 1 tablet as needed - Appropriate, Effective, Safe, Accessible ?Methocarbamol 500 mg 1 tablet as needed - Appropriate, Effective, Query Safe, Accessible ?-Medications previously tried: none  ?-Counseled on separating methocarbamol from  Xanax due to sedation. ? ?Gout (Goal: prevent flare ups and uric acid < 6) ?-Controlled ?-Current treatment  ?Allopurinol 300 mg 1 tablet daily - Appropriate, Effective, Safe, Accessible ?Colchicine 0.6 mg 1 tab

## 2022-02-26 ENCOUNTER — Telehealth: Payer: Self-pay | Admitting: Pharmacist

## 2022-02-26 ENCOUNTER — Ambulatory Visit (INDEPENDENT_AMBULATORY_CARE_PROVIDER_SITE_OTHER): Payer: Medicare Other | Admitting: Nurse Practitioner

## 2022-02-26 ENCOUNTER — Encounter: Payer: Self-pay | Admitting: Nurse Practitioner

## 2022-02-26 ENCOUNTER — Other Ambulatory Visit (HOSPITAL_COMMUNITY)
Admission: RE | Admit: 2022-02-26 | Discharge: 2022-02-26 | Disposition: A | Discharge status: Home / Self Care | Payer: Medicare Other | Source: Ambulatory Visit | Attending: Nurse Practitioner | Admitting: Nurse Practitioner

## 2022-02-26 VITALS — BP 124/80 | Ht 61.0 in | Wt 182.0 lb

## 2022-02-26 DIAGNOSIS — Z1151 Encounter for screening for human papillomavirus (HPV): Secondary | ICD-10-CM | POA: Diagnosis not present

## 2022-02-26 DIAGNOSIS — Z01419 Encounter for gynecological examination (general) (routine) without abnormal findings: Secondary | ICD-10-CM | POA: Insufficient documentation

## 2022-02-26 DIAGNOSIS — M810 Age-related osteoporosis without current pathological fracture: Secondary | ICD-10-CM

## 2022-02-26 DIAGNOSIS — Z780 Asymptomatic menopausal state: Secondary | ICD-10-CM

## 2022-02-26 NOTE — Progress Notes (Signed)
? ?  [name] 1956/04/14 [id number] ? ? ?History:  66 y.o. [name] presents for breast and pelvic exam. Postmenopausal - no HRT, no bleeding. Normal pap history. Clinical osteoporosis - T-score -2.0, hx of vertebral fracture 01/2020, 06/2021. Forteo prescribed by rheumatology. Roux-en y in 2020. HLD, depression, fibromyalgia, migraines managed by PCP.  ? ?Gynecologic History ?Patient's last menstrual period was 07/27/2006. ?  ?Contraception: post menopausal status ?Sexually active: Yes ? ?Health Maintenance ?Last Pap: 09/09/2018. Results were: Normal ?Last mammogram: 12/25/2021. Results were: Normal ?Last colonoscopy: 12/04/2012. Results were: Normal, 10-year recall ?Last Dexa: 07/12/2020. Results were: T-score -2.0 (hx of vertebral fx 2021, 2022) ? ?Past medical history, past surgical history, family history and social history were all reviewed and documented in the EPIC chart. Married. Retired from [employer]. 2 children.  ? ?ROS:  A ROS was performed and pertinent positives and negatives are included. ? ?Exam: ? ?Vitals:  ? 02/26/22 1420  ?BP: 124/80  ?Weight: 182 lb (82.6 kg)  ?Height: '5\' 1"'$  (1.549 m)  ? ?Body mass index is 34.39 kg/m?. ? ?General appearance:  Normal ?Thyroid:  Symmetrical, normal in size, without palpable masses or nodularity. ?Respiratory ? Auscultation:  Clear without wheezing or rhonchi ?Cardiovascular ? Auscultation:  Regular rate, without rubs, murmurs or gallops ? Edema/varicosities:  Not grossly evident ?Abdominal ? Soft,nontender, without masses, guarding or rebound. ? Liver/spleen:  No organomegaly noted ? Hernia:  None appreciated ? Skin ? Inspection:  Grossly normal ?Breasts: Examined lying and sitting.  ? Right: Without masses, retractions, nipple discharge or axillary adenopathy. ? ? Left: Without masses, retractions, nipple discharge or axillary adenopathy. ?Genitourinary  ? Inguinal/mons:  Normal without inguinal adenopathy ? External genitalia:  Normal appearing vulva with no  masses, tenderness, or lesions ? BUS/Urethra/Skene's glands:  Normal ? Vagina:  Normal appearing with normal color and discharge, no lesions. Atrophic changes ? Cervix:  Normal appearing without discharge or lesions ? Uterus:  Normal in size, shape and contour.  Midline and mobile, nontender ? Adnexa/parametria:   ?  Rt: Normal in size, without masses or tenderness. ?  Lt: Normal in size, without masses or tenderness. ? Anus and perineum: Normal ? Digital rectal exam: Normal sphincter tone without palpated masses or tenderness ? ?Patient informed chaperone available to be present for breast and pelvic exam. Patient has requested no chaperone to be present. Patient has been advised what will be completed during breast and pelvic exam.  ? ?Assessment/Plan:  66 y.o. [name] for breast and pelvic exam.  ? ?Well female exam with routine gynecological exam - Plan: Cytology - PAP( [place]). Education provided on SBEs, importance of preventative screenings, current guidelines, high calcium diet, regular exercise, and multivitamin daily. Labs with PCP.  ? ?Postmenopausal - no HRT, no bleeding ? ?Age-related osteoporosis without current pathological fracture - T-score -2.0, has had multiple vertebral fractures. Started on Forteo last year by rheumatology. Plans to have DXA in August.  ? ?Screening for cervical cancer - Normal Pap history.  Pap today. Discussed current guidelines and option to stop screening if this pap is normal. We will address on an annual basis.  ? ?Screening for breast cancer - Normal mammogram history.  Continue annual screenings.  Normal breast exam today. ? ?Screening for colon cancer - 2014 colonoscopy. Will repeat at 10-year interval per GI's recommendation.  ? ?Return in 2 years for breast and pelvic exam.  ? ?Tamela Gammon DNP, 2:50 PM 02/26/2022 ? ?

## 2022-02-26 NOTE — Chronic Care Management (AMB) (Signed)
Patient assistance application completed to be mailed to patient for [facility]asis  Patient Assistance: Restasis- App compl for [place] Pharmacist Assistant [phone]

## 2022-02-28 DIAGNOSIS — R278 Other lack of coordination: Secondary | ICD-10-CM | POA: Diagnosis not present

## 2022-02-28 DIAGNOSIS — R519 Headache, unspecified: Secondary | ICD-10-CM | POA: Diagnosis not present

## 2022-02-28 DIAGNOSIS — R202 Paresthesia of skin: Secondary | ICD-10-CM | POA: Diagnosis not present

## 2022-03-01 ENCOUNTER — Other Ambulatory Visit: Payer: Self-pay | Admitting: Adult Health

## 2022-03-01 DIAGNOSIS — F411 Generalized anxiety disorder: Secondary | ICD-10-CM

## 2022-03-01 DIAGNOSIS — F331 Major depressive disorder, recurrent, moderate: Secondary | ICD-10-CM

## 2022-03-01 LAB — CYTOLOGY - PAP
Comment: NEGATIVE
High risk HPV: NEGATIVE

## 2022-03-06 ENCOUNTER — Other Ambulatory Visit: Payer: Self-pay | Admitting: Nurse Practitioner

## 2022-03-06 ENCOUNTER — Other Ambulatory Visit: Payer: Self-pay

## 2022-03-06 DIAGNOSIS — N952 Postmenopausal atrophic vaginitis: Secondary | ICD-10-CM

## 2022-03-06 MED ORDER — ESTRADIOL 0.1 MG/GM VA CREA: 1.0000 g | TOPICAL_CREAM | VAGINAL | 2 refills | Status: DC

## 2022-03-08 ENCOUNTER — Other Ambulatory Visit: Payer: Self-pay | Admitting: Rheumatology

## 2022-03-08 ENCOUNTER — Ambulatory Visit: Payer: Medicare Other | Admitting: Podiatry

## 2022-03-08 NOTE — Telephone Encounter (Signed)
Next Visit: 06/06/2022 ? ?Last Visit: 12/07/2021 ? ?Last Fill: 10/23/2021 ? ?Dx: Trapezius muscle spasm -  ? ?Current Dose per office note on 12/07/2021: methocarbamol 500 mg 1 tablet by mouth daily as needed for muscle spasms.   ? ?Okay to refill Methocarbamol?   ?

## 2022-03-09 ENCOUNTER — Ambulatory Visit: Payer: Medicare Other | Admitting: Adult Health

## 2022-03-12 ENCOUNTER — Ambulatory Visit: Payer: Medicare Other | Admitting: Adult Health

## 2022-03-12 ENCOUNTER — Encounter: Payer: Self-pay | Admitting: Adult Health

## 2022-03-12 ENCOUNTER — Other Ambulatory Visit: Payer: Medicare Other

## 2022-03-12 DIAGNOSIS — F411 Generalized anxiety disorder: Secondary | ICD-10-CM | POA: Diagnosis not present

## 2022-03-12 DIAGNOSIS — G4700 Insomnia, unspecified: Secondary | ICD-10-CM

## 2022-03-12 DIAGNOSIS — F331 Major depressive disorder, recurrent, moderate: Secondary | ICD-10-CM

## 2022-03-12 DIAGNOSIS — R748 Abnormal levels of other serum enzymes: Secondary | ICD-10-CM | POA: Diagnosis not present

## 2022-03-12 MED ORDER — ALPRAZOLAM 0.25 MG PO TABS: 0.2500 mg | ORAL_TABLET | Freq: Three times a day (TID) | ORAL | 2 refills | Status: DC | PRN

## 2022-03-12 MED ORDER — DULOXETINE HCL 60 MG PO CPEP: 60.0000 mg | ORAL_CAPSULE | Freq: Two times a day (BID) | ORAL | 3 refills | Status: DC

## 2022-03-12 MED ORDER — ARIPIPRAZOLE 5 MG PO TABS: 5.0000 mg | ORAL_TABLET | Freq: Every day | ORAL | 3 refills | Status: DC

## 2022-03-12 NOTE — Progress Notes (Signed)
[name] ?[id number] ?Mar 01, 1956 ?66 y.o. ? ?Subjective:  ? ?Patient ID:  [name] is a 66 y.o. (DOB [birth year]) female. ? ?Chief Complaint: No chief complaint on file. ? ? ?HPI ?[name] presents to the office today for follow-up of anxiety, depression, and insomnia. ? ?Describes mood today as "ok". Pleasant. Mood symptoms - reports some depression and anxiety. Denies irritability. Reports some health concerns - limited mobility is frustrating. Not able to get out and walk like she was. Concerned about weight gain. Stating "It's been frustrating for me". Stable interest and motivation. Taking medications as prescribed.  ?Energy levels lower. Active, does not have a regular exercise routine with current physical disabilities.  ?Enjoys some usual interests and activities. Married. Lives with husband. Has 2 sons - one local and another one in [place]. Mother local - 42. Spending time with family. ?Appetite ade quate. Weight gain with inactivity - 175 to 183 pounds. ?Sleeps better some nights than others. Averages 5 hours. ?Focus and concentration stable. Completing tasks. Managing aspects of household.  ?Denies SI or HI.  ?Denies AH or VH ? ?Bariatric surgery 11/20. ? ? ?PHQ2-9   ? ?Flowsheet Row Video Visit from 01/17/2022 in [place] Visit from 06/28/2021 in [place] Visit from 01/26/2021 in [facility] from 11/07/2020 in [facility] Visit from 05/02/2020 in [facility]  ?PHQ-2 Total Score 0 1 0 0 0  ?PHQ-9 Total Score 1 7 -- -- --  ? ?  ? ?[facility] from 12/31/2021 in [facility] ED from 07/04/2021 in [facility] ED to Hosp-Admission (Discharged) from 01/21/2021 in [facility] [place]  ?C-SSRS RISK CATEGORY No Risk No Risk No Risk  ? ?  ?  ? ?Review of Systems:  ?Review of Systems  ?Musculoskeletal:  Negative for gait problem.   ?Neurological:  Negative for tremors.  ?Psychiatric/Behavioral:    ?     Please refer to HPI  ? ?Medications: I have reviewed the patient's current medications. ? ?Current Outpatient Medications  ?Medication Sig Dispense Refill  ? acetaminophen (TYLENOL) 500 MG tablet Take 1,000 mg by mouth every 8 (eight) hours as needed for moderate pain.    ? albuterol (VENTOLIN HFA) 108 (90 Base) MCG/ACT inhaler TAKE 2 PUFFS BY MOUTH EVERY 6 HOURS AS NEEDED FOR WHEEZE OR SHORTNESS OF BREATH 8.5 each 0  ? allopurinol (ZYLOPRIM) 300 MG tablet Take 1 tablet (300 mg total) by mouth daily. 90 tablet 3  ? ALPRAZolam (XANAX) 0.25 MG tablet Take 1 tablet (0.25 mg total) by mouth 3 (three) times daily as needed for anxiety. 90 tablet 2  ? ARIPiprazole (ABILIFY) 5 MG tablet Take 1 tablet (5 mg total) by mouth daily. 90 tablet 3  ? Armodafinil 250 MG tablet Take 1 tablet by mouth daily with breakfast. 30 tablet 0  ? aspirin 81 MG chewable tablet     ? atorvastatin (LIPITOR) 40 MG tablet Take 1 tablet (40 mg total) by mouth daily. 90 tablet 3  ? CALCIUM PO Take 500 mg by mouth in the morning, at noon, and at bedtime.    ? cetirizine (ZYRTEC) 10 MG tablet Take 10 mg by mouth at bedtime.    ? colchicine 0.6 MG tablet Take 1.'2mg'$  (2 tablets) then 0.'6mg'$  (1 tablet) 1 hour after. Then, take 1 tablet every day for 7 days. 10 tablet 2  ? DULoxetine (CYMBALTA) 60 MG capsule  Take 1 capsule (60 mg total) by mouth 2 (two) times daily. 180 capsule 3  ? estradiol (ESTRACE VAGINAL) 0.1 MG/GM vaginal cream Place 1 g vaginally 2 (two) times a week. 42.5 g 2  ? fluticasone (FLONASE) 50 MCG/ACT nasal spray PLACE 2 SPRAYS INTO BOTH NOSTRILS DAILY AS NEEDED FOR ALLERGIES 16 mL 2  ? gabapentin (NEURONTIN) 300 MG capsule Take 300 mg by mouth 3 (three) times daily.    ? Insulin Pen Needle 31G X 5 MM MISC Use to inject Forteo once daily. DISCARD AFTER USE. 100 each 2  ? meclizine (ANTIVERT) 25 MG tablet Take 1 tablet (25 mg total) by mouth 3 (three) times daily as  needed for dizziness. 30 tablet 0  ? melatonin 5 MG TABS Take 5 mg by mouth at bedtime.     ? methocarbamol (ROBAXIN) 500 MG tablet TAKE 1 TABLET BY MOUTH EVERY DAY AS NEEDED 30 tablet 2  ? metoprolol tartrate (LOPRESSOR) 50 MG tablet TAKE 1 TABLET BY MOUTH TWICE A DAY 180 tablet 0  ? Multiple Vitamins-Minerals (BARIATRIC MULTIVITAMINS/IRON PO) Take 2 each by mouth daily.    ? Multiple Vitamins-Minerals (ICAPS AREDS 2) CAPS Take 1 capsule by mouth 2 (two) times daily.    ? nortriptyline (PAMELOR) 50 MG capsule Take 100 mg by mouth at bedtime.    ? ondansetron (ZOFRAN) 8 MG tablet Take 8 mg by mouth every 8 (eight) hours as needed for nausea.  0  ? OXcarbazepine (TRILEPTAL) 150 MG tablet Take 150 mg by mouth 2 (two) times daily.    ? pantoprazole (PROTONIX) 40 MG tablet Take 1 tablet (40 mg total) by mouth daily. 90 tablet 0  ? Probiotic Product (PROBIOTIC ADVANCED PO) Take by mouth.    ? promethazine (PHENERGAN) 25 MG tablet Take 25 mg by mouth as needed.    ? RESTASIS 0.05 % ophthalmic emulsion 1 drop 2 (two) times daily.    ? Teriparatide, Recombinant, (FORTEO) 600 MCG/2.4ML SOPN Inject 20 mcg into the skin daily. 2.4 mL 2  ? ?No current facility-administered medications for this visit.  ? ? ?Medication Side Effects: None ? ?Allergies:  ?Allergies  ?Allergen Reactions  ? Erythromycin Nausea Only  ?  Abdominal pain ?Other reaction(s): Unknown  ? Meperidine Hcl   ? Polyethyl Glyc-Propyl Glyc Pf   ? Dilaudid [Hydromorphone Hcl] Itching  ? ? ?Past Medical History:  ?Diagnosis Date  ? Anemia   ? previously followed by Dr. Jamse Arn for anemia and elevated platelets  ? Anxiety   ? C. difficile colitis 10/01/2012  ? treated by [facility]  ? Chronic fatigue syndrome   ? Closed wedge compression fracture of T8 vertebra ([facility]) 06/2021  ? DDD (degenerative disc disease), lumbar 08/19/2014  ? and facet arthroplasty & left lumbar radiculopathy (Dr.Ramos)  ? Depression   ? Dyssynergia   ? dyssynergenic defecation, contributing to fecal  incontinence.  ? Edema   ? Fibromyalgia   ? Gastroparesis   ? followed at [facility]  ? GERD (gastroesophageal reflux disease)   ? History of kidney stones   ? History of vertebral fracture 06/30/2021  ? Hyperlipidemia   ? Kidney stone   ? Lumbar radiculopathy   ? Migraine   ? Neuropathy   ? Obstructive sleep apnea   ? Does  wear  CPAP  ? Osteoporosis   ? Paresthesia   ? Dr. Everette Rank at [facility]  ? Pelvic floor dysfunction   ? pelvic floor dyssynergy  ? Plantar fasciitis 02/2011  ?  R foot  ? Pneumonia   ? 2012  ? PONV (postoperative nausea and vomiting)   ? pt states has gastroparesis has difficulty taking antibiotics and narcotics has severe nausea and vomiting   ? Restless leg syndrome   ? S/P endometrial ablation 08/09/2006  ? Novasure Ablation  ? S/P epidural steroid injection 09/20/2014  ? Dr.Ramos  ? Tremor   ? Dr. Everette Rank  ? Urinary frequency   ? Urinary incontinence   ? ? ?Past Medical History, Surgical history, Social history, and Family history were reviewed and updated as appropriate.  ? ?Please see review of systems for further details on the patient's review from today.  ? ?Objective:  ? ?Physical Exam:  ?LMP 07/27/2006  ? ?Physical Exam ?Constitutional:   ?   General: She is not in acute distress. ?Musculoskeletal:     ?   General: No deformity.  ?Neurological:  ?   Mental Status: She is alert and oriented to person, place, and time.  ?   Coordination: Coordination normal.  ?Psychiatric:     ?   Attention and Perception: Attention and perception normal. She does not perceive auditory or visual hallucinations.     ?   Mood and Affect: Mood normal. Mood is not anxious or depressed. Affect is not labile, blunt, angry or inappropriate.     ?   Speech: Speech normal.     ?   Behavior: Behavior normal.     ?   Thought Content: Thought content normal. Thought content is not paranoid or delusional. Thought content does not include homicidal or suicidal ideation. Thought content does not include homicidal or  suicidal plan.     ?   Cognition and Memory: Cognition and memory normal.     ?   Judgment: Judgment normal.  ?   Comments: Insight intact  ? ? ?Lab Review:  ?   ?Component Value Date/Time  ? NA 141 02/06/19

## 2022-03-13 ENCOUNTER — Encounter: Payer: Self-pay | Admitting: Family Medicine

## 2022-03-13 ENCOUNTER — Other Ambulatory Visit (HOSPITAL_COMMUNITY): Payer: Self-pay

## 2022-03-13 ENCOUNTER — Other Ambulatory Visit: Payer: Self-pay | Admitting: Rheumatology

## 2022-03-13 LAB — ALKALINE PHOSPHATASE: Alkaline Phosphatase: 268 IU/L — ABNORMAL HIGH (ref 44–121)

## 2022-03-13 MED ORDER — ARMODAFINIL 250 MG PO TABS
ORAL_TABLET | ORAL | 0 refills | Status: DC
  Filled 2022-03-13: qty 30, 30d supply, fill #0

## 2022-03-13 NOTE — Telephone Encounter (Signed)
Next Visit: 06/06/2022 ?  ?Last Visit: 12/07/2021 ?  ?Last Fill: 12/14/2021 ?  ?Dx: management of chronic fatigue ?  ?Current Dose per office note on 12/07/2021: armodafinil 250 mg daily with breakfast. ?  ?Okay to refill Armodafinil?  ?

## 2022-03-15 LAB — ALKALINE PHOSPHATASE, ISOENZYMES
Alkaline Phosphatase: 271 IU/L — ABNORMAL HIGH (ref 44–121)
BONE FRACTION: 52 % (ref 14–68)
INTESTINAL FRAC.: 3 % (ref 0–18)
LIVER FRACTION: 45 % (ref 18–85)

## 2022-03-15 LAB — SPECIMEN STATUS REPORT

## 2022-03-15 NOTE — Progress Notes (Signed)
Eve, ?Forteo can increase alkaline phosphatase levels by a 45%.  Alkaline phosphatase levels can continue to increase while patient is on Forteo.  She also had vertebral fracture and kyphoplasty in December which can contribute to increase in alkaline phosphatase level.  If there is a concern you can order total body bone scan to look further into it. ?I hope this helps. ?Abel Presto

## 2022-03-16 ENCOUNTER — Encounter: Payer: Self-pay | Admitting: Physician Assistant

## 2022-03-16 ENCOUNTER — Telehealth (INDEPENDENT_AMBULATORY_CARE_PROVIDER_SITE_OTHER): Payer: Medicare Other | Admitting: Physician Assistant

## 2022-03-16 VITALS — BP 146/72 | HR 83

## 2022-03-16 DIAGNOSIS — R058 Other specified cough: Secondary | ICD-10-CM

## 2022-03-16 DIAGNOSIS — J0140 Acute pansinusitis, unspecified: Secondary | ICD-10-CM | POA: Diagnosis not present

## 2022-03-16 DIAGNOSIS — J309 Allergic rhinitis, unspecified: Secondary | ICD-10-CM

## 2022-03-16 MED ORDER — BENZONATATE 200 MG PO CAPS: 200.0000 mg | ORAL_CAPSULE | Freq: Three times a day (TID) | ORAL | 0 refills | Status: DC | PRN

## 2022-03-16 MED ORDER — DOXYCYCLINE HYCLATE 100 MG PO TABS: 100.0000 mg | ORAL_TABLET | Freq: Two times a day (BID) | ORAL | 0 refills | Status: DC

## 2022-03-16 NOTE — Patient Instructions (Addendum)
You can take an over the counter antihistamine to help with allergic rhinitis / itching / hives: NON-DROWSY Allegra (Fexofenadine) 180 mg daily or NON-Drowsy Claritin (Loratidine) 10 mg daily  or DROWSY Benadryl (Diphenhydramine) 25 mg as directed or Zyrtec (Cetirizine) 10 mg daily. You can go to a store with a pharmacy and ask them to help you find these medicines. ? ?You can take an OTC expectorant like guaifenesin or Mucinex to help decrease head and nasal congestion.  ? ?You can use OTC allergy eye drops for itchy eyes like Zaditor or Patanol ?

## 2022-03-16 NOTE — Progress Notes (Signed)
Start time: 10:12 am ?End time: 10:30 am ? ?Virtual Visit via Video Note ? ? Patient ID: [name], female    DOB: July 09, 1956, 66 y.o.   MRN: [id number] ? ?I connected with above patient on 03/16/22 by a video enabled telemedicine application and verified that I am speaking with the correct person using two identifiers. ? ?Location: ?Patient: home ?Provider: office ?  ?I discussed the limitations of evaluation and management by telemedicine and the availability of in person appointments. The patient expressed understanding and agreed to proceed. ? ?History of Present Illness: ? ?Chief Complaint  ?Patient presents with  ? Acute Visit  ?  Virtual- Sinus congestion with greenish mucous. Coughing and scratchy throat. Covid test negative on Thurs.  ? ?Reports a 4 day history of cough productive of  phlegm, mild sore throat, but can eat and drink, head congestion, post-nasal drip, and blowing yellow and green out of her nose, itchy, watery eyes, minimal facial pressure and pain, minimal headache;  denies fever / chills / nausea / vomiting / diarrhea / constipation; did do a home COVID test that was negative; denies sick contacts; denies travel out of state and out of the country; denies tobacco use; history of allergic rhinitis; OTC Alka seltzer sinus capsules and Delsym are a little helpful ? ?Observations/Objective: ? ?BP (!) 146/72   Pulse 83   LMP 07/27/2006  ? ? ?Assessment: ?Encounter Diagnoses  ?Name Primary?  ? Cough productive of purulent sputum Yes  ? Acute non-recurrent pansinusitis   ? Allergic rhinitis, unspecified seasonality, unspecified trigger   ? ? ? ?Plan: ?Rest, increase clear fluids, OTC Tylenol (acetamenophen) sparingly for body aches, headaches, fever, chills as needed.  ? ? ?[name] was seen today for acute visit. ? ?Diagnoses and all orders for this visit: ? ?Cough productive of purulent sputum ?- tessalon perles, doxycycline ? ?Acute non-recurrent pansinusitis ?- doxycycline ? ?Allergic  rhinitis, unspecified seasonality, unspecified trigger ?- otc antihistamines and Zadidor or Patanol eye drops for itchy eyes ? ?Other orders ?-     doxycycline (VIBRA-TABS) 100 MG tablet; Take 1 tablet (100 mg total) by mouth 2 (two) times daily. ?-     benzonatate (TESSALON) 200 MG capsule; Take 1 capsule (200 mg total) by mouth 3 (three) times daily as needed for cough. ? ? ? ?Follow up: as already scheduled ? ? ?I discussed the assessment and treatment plan with the patient. The patient was provided an opportunity to ask questions and all were answered. The patient agreed with the plan and demonstrated an understanding of the instructions. ?  ?The patient was advised to call back or seek an in-person evaluation if the symptoms worsen or if the condition fails to improve as anticipated. For emergencies go to Urgent Care or the Emergency Department for immediate evaluation.  ? ?I spent 15 minutes dedicated to the care of this patient, including pre-visit review of records, face to face time, post-visit ordering of testing and documentation. ? ? ? ?Irene Pap, PA-C ?

## 2022-03-22 ENCOUNTER — Ambulatory Visit: Payer: Medicare Other | Admitting: Podiatry

## 2022-03-22 DIAGNOSIS — M21861 Other specified acquired deformities of right lower leg: Secondary | ICD-10-CM

## 2022-03-22 DIAGNOSIS — M109 Gout, unspecified: Secondary | ICD-10-CM | POA: Diagnosis not present

## 2022-03-22 DIAGNOSIS — M7661 Achilles tendinitis, right leg: Secondary | ICD-10-CM

## 2022-03-22 MED ORDER — METHYLPREDNISOLONE 4 MG PO TBPK: ORAL_TABLET | ORAL | 0 refills | Status: DC

## 2022-03-22 NOTE — Patient Instructions (Addendum)
Call to schedule physical therapy: ?[facility] at [facility] ?[place] ? [phone] ? ? ?Look for an "EvenUp" shoe attachment on [employer] or at [employer]. This will level out your hips while you are walking in the CAM boot. Wear this on the other foot around a supportive sneaker: ? ? ? ?

## 2022-03-22 NOTE — Progress Notes (Signed)
?  Subjective:  ?Patient ID: [name], female    DOB: 08/31/1956,  MRN: [id number] ? ?Chief Complaint  ?Patient presents with  ? Tendonitis  ?  Surgery consult right  ? ? ?66 y.o. female presents with the above complaint. History confirmed with patient.  She returns today for surgical planning visit for the right great toe arthritis from her gout as well as follow-up on her Achilles tendinitis.  The Achilles tendon is still been very painful for her and is not getting much better.  She not taking any of the anti-inflammatory because she discussed with her primary and that it may worsen her gastric reflux disease ? ?Objective:  ?Physical Exam: ?warm, good capillary refill, no trophic changes or ulcerative lesions, normal DP and PT pulses, normal sensory exam, and ulcer at the tip of the toe has completely healed.  Severe arthrosis of the IPJ of the right hallux with deformity and edema she has pain and tenderness in the mid substance of the Achilles tendon.  Gastrocnemius equinus is noted ?Assessment:  ? ?1. Achilles tendinitis, right leg   ?2. Gouty arthritis of toe of right foot   ? ? ? ?Plan:  ?Patient was evaluated and treated and all questions answered. ? ? ? ?Today she had a new issue of Achilles tendinitis. Discussed the etiology and treatment options for Achilles tendinitis including stretching, formal physical therapy with an eccentric exercises therapy plan, supportive shoegears such as a running shoe or sneaker, heel lifts, topical and oral medications.  We also discussed that I do not routinely perform injections in this area because of the risk of an increased damage or rupture of the tendon.  We also discussed the role of surgical treatment of this for patients who do not improve after exhausting non-surgical treatment options. ? ?Achilles tendon has not improved with home therapy alone.  I recommend increasing her mobilization into a short cam boot with a heel lift and this was dispensed today.  I  also recommended physical therapy and referral was sent to [facility] and she will call to schedule this.  I will see her back in 6 weeks for reevaluation if its not improved by that point recommend an MRI of the Achilles tendon ? ? ?We can discuss her chronic arthrosis of the IPJ of the right hallux secondary to gout.  Her previous pathology results were negative for osteomyelitis.  I recommend we proceed with surgical arthrodesis of the IPJ of the right hallux.  We discussed the risk benefits and potential complications including but not limited to pain, swelling, infection, scar, numbness which may be temporary or permanent, chronic pain, stiffness, nerve pain or damage, wound healing problems, bone healing problems including delayed or non-union.  All questions were addressed.  She understands and wishes to proceed.  Surgery be scheduled for a tentative date today, we may need to delay if her Achilles tendon is not progressing appropriately. ? ? ? ?Surgical plan: ? ?Procedure: ?-Arthrodesis right hallux IPJ ? ?Location: ?-[facility] ? ?Anesthesia plan: ?-IV sedation with a local field block ? ?Postoperative pain plan: ?- Tylenol 1000 mg every 6 hours, ibuprofen 600 mg every 6 hours, gabapentin 300 mg every 8 hours x5 days, oxycodone 5 mg 1-2 tabs every 6 hours only as needed ? ?DVT prophylaxis: ?-None required ? ?WB Restrictions / DME needs: ?-WBAT in cam boot which was also dispensed today ? ? ? ?Return in about 6 weeks (around 05/03/2022) for re-check Achilles tendon.  ? ?

## 2022-03-23 ENCOUNTER — Encounter: Payer: Self-pay | Admitting: Family Medicine

## 2022-03-23 ENCOUNTER — Telehealth: Payer: Self-pay

## 2022-03-23 ENCOUNTER — Ambulatory Visit (INDEPENDENT_AMBULATORY_CARE_PROVIDER_SITE_OTHER): Payer: Medicare Other | Admitting: Family Medicine

## 2022-03-23 VITALS — BP 102/62 | HR 77 | Temp 98.2°F | Wt 186.8 lb

## 2022-03-23 DIAGNOSIS — R058 Other specified cough: Secondary | ICD-10-CM | POA: Diagnosis not present

## 2022-03-23 DIAGNOSIS — J309 Allergic rhinitis, unspecified: Secondary | ICD-10-CM | POA: Diagnosis not present

## 2022-03-23 MED ORDER — AMOXICILLIN-POT CLAVULANATE 875-125 MG PO TABS
1.0000 | ORAL_TABLET | Freq: Two times a day (BID) | ORAL | 0 refills | Status: DC
Start: 2022-03-23 — End: 2022-07-05

## 2022-03-23 NOTE — Telephone Encounter (Signed)
Pt states she has finished antibiotic and is out of her tessalon pearls and cough is now worse, has little wheeze has had to use her inhaler.  No fever.  She was started on prednisone today for tendonitis.  She didn't know if she needed to be seen again or if she needs another antibiotic and more cough meds?

## 2022-03-23 NOTE — Progress Notes (Signed)
? ?  Subjective:  ? ? Patient ID: [name], female    DOB: 06/03/1956, 66 y.o.   MRN: [id number] ? ?HPI ?She was treated a little over a week ago for bronchitis however did not respond to the antibiotic.  She is still having difficulty with increasing cough.  She does state that the nasal congestion is much better.  She is also having less difficulty with sore throat, sinus pressure.  Her voice is still hoarse.  She does have underlying allergies and does use Zyrtec and Flonase. ? ? ?Review of Systems ? ?   ?Objective:  ? Physical Exam ?Alert and in no distress. Tympanic membranes and canals are normal. Pharyngeal area is normal. Neck is supple without adenopathy or thyromegaly. Cardiac exam shows a regular sinus rhythm without murmurs or gallops. Lungs are clear to auscultation. ? ? ? ? ?   ?Assessment & Plan:  ?Cough productive of purulent sputum - Plan: amoxicillin-clavulanate (AUGMENTIN) 875-125 MG tablet ? ?Allergic rhinitis, unspecified seasonality, unspecified trigger ?Continue on allergy medications.  Use cough suppressant as necessary. ? ?

## 2022-03-27 ENCOUNTER — Telehealth: Payer: Self-pay

## 2022-03-27 NOTE — Telephone Encounter (Signed)
Patient informed. 

## 2022-03-27 NOTE — Telephone Encounter (Signed)
Please let Samariah know that risk of endometrial cancer is mentioned with any unopposed estrogen use (meaning without progesterone for those with a uterus). These risks are for other systemic forms of estrogen, not vaginal estrogen. Studies have shown no correlation between unopposed vaginal estrogen and endometrial cancer.  ?

## 2022-03-27 NOTE — Telephone Encounter (Signed)
Pt was seen

## 2022-03-27 NOTE — Telephone Encounter (Signed)
TW, NP prescribed Estrace Vag Cream twice weekly for atrophy indicated on Pap result. Patient picked up the Rx and read insert. She expressed concern about that she read it "may increase chances of uterine cancer". ? ?I told her I will ask Tiffany,NP about it as I am not familiar with the warnings. ? ? ?

## 2022-04-11 ENCOUNTER — Other Ambulatory Visit: Payer: Self-pay | Admitting: Rheumatology

## 2022-04-12 ENCOUNTER — Telehealth: Payer: Self-pay | Admitting: Rheumatology

## 2022-04-12 ENCOUNTER — Other Ambulatory Visit (HOSPITAL_COMMUNITY): Payer: Self-pay

## 2022-04-12 MED ORDER — ARMODAFINIL 250 MG PO TABS
ORAL_TABLET | ORAL | 0 refills | Status: DC
  Filled 2022-04-12: qty 30, 30d supply, fill #0

## 2022-04-12 NOTE — Telephone Encounter (Signed)
Patient called requesting prescription refill of Armodafinil to be sent to [facility].  Patient states she is going out of town this afternoon and would like to pick up her prescription by 12:00 pm.

## 2022-04-12 NOTE — Telephone Encounter (Signed)
Spoke with patient and advised prescription has been sent to the pharmacy.

## 2022-04-12 NOTE — Telephone Encounter (Signed)
Next Visit: 06/06/2022   Last Visit: 12/07/2021   Last Fill: 03/13/2022   Dx: management of chronic fatigue   Current Dose per office note on 12/07/2021: armodafinil 250 mg daily with breakfast.   Okay to refill Armodafinil?

## 2022-04-14 ENCOUNTER — Ambulatory Visit: Payer: Medicare Other | Attending: Podiatry | Admitting: Physical Therapy

## 2022-04-14 ENCOUNTER — Encounter: Payer: Self-pay | Admitting: Physical Therapy

## 2022-04-14 DIAGNOSIS — R262 Difficulty in walking, not elsewhere classified: Secondary | ICD-10-CM | POA: Diagnosis not present

## 2022-04-14 DIAGNOSIS — M21861 Other specified acquired deformities of right lower leg: Secondary | ICD-10-CM | POA: Insufficient documentation

## 2022-04-14 DIAGNOSIS — R600 Localized edema: Secondary | ICD-10-CM | POA: Diagnosis not present

## 2022-04-14 DIAGNOSIS — M7661 Achilles tendinitis, right leg: Secondary | ICD-10-CM | POA: Insufficient documentation

## 2022-04-14 DIAGNOSIS — M25571 Pain in right ankle and joints of right foot: Secondary | ICD-10-CM | POA: Insufficient documentation

## 2022-04-14 NOTE — Therapy (Signed)
OUTPATIENT PHYSICAL THERAPY LOWER EXTREMITY EVALUATION   Patient Name: [name] MRN: [id number] DOB:November 12, 1956, 66 y.o., female Today's Date: 04/14/2022   PT End of Session - 04/14/22 1200     Visit Number 1    Date for PT Re-Evaluation 06/09/22   1-2x/week   Authorization Type [employer] Medicare - FOTO    PT Start Time 0945    PT Stop Time 1030    PT Time Calculation (min) 45 min             Past Medical History:  Diagnosis Date   Anemia    previously followed by Dr. Jamse Arn for anemia and elevated platelets   Anxiety    C. difficile colitis 10/01/2012   treated by [facility]   Chronic fatigue syndrome    Closed wedge compression fracture of T8 vertebra ([place]) 06/2021   DDD (degenerative disc disease), lumbar 08/19/2014   and facet arthroplasty & left lumbar radiculopathy (Dr.Ramos)   Depression    Dyssynergia    dyssynergenic defecation, contributing to fecal incontinence.   Edema    Fibromyalgia    Gastroparesis    followed at [facility]   GERD (gastroesophageal reflux disease)    History of kidney stones    History of vertebral fracture 06/30/2021   Hyperlipidemia    Kidney stone    Lumbar radiculopathy    Migraine    Neuropathy    Obstructive sleep apnea    Does  wear  CPAP   Osteoporosis    Paresthesia    Dr. Everette Rank at [facility]   Pelvic floor dysfunction    pelvic floor dyssynergy   Plantar fasciitis 02/2011   R foot   Pneumonia    2012   PONV (postoperative nausea and vomiting)    pt states has gastroparesis has difficulty taking antibiotics and narcotics has severe nausea and vomiting    Restless leg syndrome    S/P endometrial ablation 08/09/2006   Novasure Ablation   S/P epidural steroid injection 09/20/2014   Dr.Ramos   Tremor    Dr. Everette Rank   Urinary frequency    Urinary incontinence    Past Surgical History:  Procedure Laterality Date   CHOLECYSTECTOMY  07/2004   ENDOMETRIAL ABLATION  08/09/2006   Dr. Valentina Shaggy Ablation    FACET JOINT INJECTION  04/17/2017   Left L4-5 and L5-S1   GASTRIC ROUX-EN-Y N/A 09/29/2019   Procedure: LAPAROSCOPIC ROUX-EN-Y GASTRIC BYPASS WITH UPPER ENDOSCOPY, ERAS Pathway;  Surgeon: Johnathan Hausen, MD;  Location: [facility];  Service: General;  Laterality: N/A;   [place]   R knee, Dr. Eddie Dibbles, torn cartilage   KYPHOPLASTY  11/06/2021   T8-T9-Nundkumar   RETINAL LASER PROCEDURE Right 08/28/2018   laser retinopexy   RIGHT/LEFT HEART CATH AND CORONARY ANGIOGRAPHY N/A 01/01/2018   Procedure: RIGHT/LEFT HEART CATH AND CORONARY ANGIOGRAPHY;  Surgeon: Sherren Mocha, MD;  Location: [place] [facility];  Service: Cardiovascular;  Laterality: N/A;   TONSILLECTOMY  1968   TONSILLECTOMY     TOTAL KNEE ARTHROPLASTY Right 09/05/2015   Procedure: RIGHT TOTAL KNEE ARTHROPLASTY;  Surgeon: Gaynelle Arabian, MD;  Location: [facility];  Service: Orthopedics;  Laterality: Right;   TOTAL KNEE ARTHROPLASTY Left 07/14/2018   Procedure: LEFT TOTAL KNEE ARTHROPLASTY;  Surgeon: Gaynelle Arabian, MD;  Location: [facility];  Service: Orthopedics;  Laterality: Left;   ULTRASOUND GUIDANCE FOR VASCULAR ACCESS  01/01/2018   Procedure: Ultrasound Guidance For Vascular Access;  Surgeon:  Sherren Mocha, MD;  Location: [place] [facility];  Service: Cardiovascular;;   UMBILICAL HERNIA REPAIR N/A 09/29/2019   Procedure: HERNIA REPAIR UMBILICAL ADULT;  Surgeon: Johnathan Hausen, MD;  Location: [facility];  Service: General;  Laterality: N/A;   Patient Active Problem List   Diagnosis Date Noted   Osteopenia 08/04/2021   Acute right-sided weakness 01/22/2021   Peripheral polyneuropathy 01/22/2021   History of migraine headaches 01/22/2021   Difficulty with speech    Gastric bypass status for obesity 09/29/2019   Aftercare 08/12/2018   Pain in left knee 06/03/2018   Dyspnea 12/26/2017   Restrictive lung disease secondary to obesity 12/26/2017   Atypical chest pain 12/06/2017   Sinus tachycardia 12/06/2017    Chest pain 12/06/2017   DJD (degenerative joint disease), cervical 12/24/2016   Primary osteoarthritis of both hips 12/24/2016   Primary osteoarthritis of both knees 12/24/2016   H/O total knee replacement, right 12/24/2016   Spondylosis of lumbar region without myelopathy or radiculopathy 12/24/2016   Lumbosacral spondylosis without myelopathy 12/24/2016   Acute gout 05/30/2016   Gout 05/30/2016   Myalgia 03/29/2016   Other long term (current) drug therapy 03/29/2016   Idiopathic peripheral neuropathy 03/29/2016   Cannot sleep 03/29/2016   Migraine without aura and responsive to treatment 03/29/2016   Multifocal myoclonus 03/29/2016   Restless leg 03/29/2016   Has a tremor 03/29/2016   History of aspiration pneumonitis 01/25/2016   History of acute bronchitis 01/25/2016   LPRD (laryngopharyngeal reflux disease) 01/25/2016   Imbalance 01/09/2016   Serotonin syndrome 12/22/2015   Essential hypertension 09/10/2015   OA (osteoarthritis) of knee 09/05/2015   Obesity 05/17/2015   OSA (obstructive sleep apnea) 03/16/2013   Iron deficiency 11/06/2012   Thrombocythemia 11/06/2012   Leukocytosis 11/05/2012   Impaired fasting glucose 07/23/2012   Bronchitis 04/03/2012   Kidney stone on left side 03/05/2012   Kidney cysts 03/04/2012   Loose stools 03/04/2012   Urinary frequency 12/04/2011   Restless leg syndrome 12/04/2011   Polypharmacy 12/04/2011   Bladder incontinence 12/04/2011   Fibromyalgia    Mixed hyperlipidemia    Edema    S/P endometrial ablation    Allergic rhinitis 09/18/2011   Depression, major, single episode, in partial remission ([place]) 04/21/2011   PRECORDIAL PAIN 02/17/2010   Anxiety state 01/30/2010   Migraine 01/30/2010   GERD without esophagitis 01/30/2010   Gastroparesis 01/30/2010    PCP: Rita Ohara, MD  REFERRING PROVIDER: Criselda Peaches, DPM  THERAPY DIAG:  Difficulty in walking, not elsewhere classified - Plan: PT plan of care  cert/re-cert  Pain in right ankle and joints of right foot - Plan: PT plan of care cert/re-cert  Localized edema - Plan: PT plan of care cert/re-cert  REFERRING DIAG: Referral diagnosis: Achilles tendinitis, right leg [M76.61], Gastrocnemius equinus of right lower extremity [M21.861]  Rationale for Evaluation and Treatment Rehabilitation  SUBJECTIVE:  PERTINENT PAST HISTORY:  Chronic fatigue, T8 compression fx (2022), Osteoporosis, plantar fasciitis, idiopathic neuropathy (bil feet), fibromyalgia, R great toe pain      PRECAUTIONS: Fall  WEIGHT BEARING RESTRICTIONS No  FALLS:  Has patient fallen in last 6 months? Yes, Number of falls: rolled out of bed  LIVING ENVIRONMENT: Lives with: lives with their family Stairs: Yes; Internal: 10 steps; on right going up  MOI/History of condition:  Onset date: ~5 months  Sanaa Zilberman Olden is a 66 y.o. female who presents to clinic with chief complaint of R sided achilles pain.  She has  had a prednisone taper which was somewhat helpful temporarily.  From referring provider:   "Today she had a new issue of Achilles tendinitis. Discussed the etiology and treatment options for Achilles tendinitis including stretching, formal physical therapy with an eccentric exercises therapy plan, supportive shoegears such as a running shoe or sneaker, heel lifts, topical and oral medications.  We also discussed that I do not routinely perform injections in this area because of the risk of an increased damage or rupture of the tendon.  We also discussed the role of surgical treatment of this for patients who do not improve after exhausting non-surgical treatment options.   Achilles tendon has not improved with home therapy alone.  I recommend increasing her mobilization into a short cam boot with a heel lift and this was dispensed today.  I also recommended physical therapy and referral was sent to [facility] and she will call to schedule this.  I will see her back in  6 weeks for reevaluation if its not improved by that point recommend an MRI of the Achilles tendon     We can discuss her chronic arthrosis of the IPJ of the right hallux secondary to gout.  Her previous pathology results were negative for osteomyelitis.  I recommend we proceed with surgical arthrodesis of the IPJ of the right hallux.  We discussed the risk benefits and potential complications including but not limited to pain, swelling, infection, scar, numbness which may be temporary or permanent, chronic pain, stiffness, nerve pain or damage, wound healing problems, bone healing problems including delayed or non-union.  All questions were addressed.  She understands and wishes to proceed.  Surgery be scheduled for a tentative date today, we may need to delay if her Achilles tendon is not progressing appropriately.  "   Red flags:  denies   Pain:  Are you having pain? Yes Pain location: achilles tendon (insertional?) NPRS scale:  current 3/10  average 6/10  Aggravating factors: walking, standing, activity  NPRS, highest: 9/10 Relieving factors: resting, ice  NPRS: best: 3/10 Pain description: intermittent, constant, sharp, and aching Stage: Chronic Stability: staying the same 24 hour pattern: NA   Occupation: NA  Assistive Device: NA  Hand Dominance: NA  Patient Goals/Specific Activities: walking for exercise   PLOF: Independent  DIAGNOSTIC FINDINGS: NA   OBJECTIVE:    GENERAL OBSERVATION/GAIT:   Antalgic gait with reduced time in stance R.  Low arch bil with navicular drop  SENSATION:  Light touch: Deficits bil feet  PALPATION: TTP calcaneal insertion of R achilles  LE MMT:  MMT Right 04/14/2022 Left 04/14/2022  Hip flexion (L2, L3) N (ratcheting) n  Knee extension (L3) n n  Knee flexion n n  Hip abduction    Hip extension    Hip external rotation    Hip internal rotation    Hip adduction    Ankle dorsiflexion (L4)    Ankle plantarflexion (S1) D D   Ankle inversion    Ankle eversion    Great Toe ext (L5)    Grossly     (Blank rows = not tested, score listed is out of 5 possible points.  N = WNL, D = diminished, C = clear for gross weakness with myotome testing, * = concordant pain with testing)  LE ROM:  ROM Right 04/14/2022 Left 04/14/2022  Hip flexion    Hip extension    Hip abduction 3+ 4  Hip adduction    Hip internal rotation  Hip external rotation    Knee flexion    Knee extension    Ankle dorsiflexion 5 5  Ankle plantarflexion    Ankle inversion    Ankle eversion     (Blank rows = not tested, N = WNL, * = concordant pain with testing)  Functional Tests  Eval (04/14/2022)    Progressive balance screen (highest level completed for >/= 10''):  Tandem: R in rear 10'', L in rear 2''                                                           PATIENT SURVEYS:  FOTO 52 -> 58   TODAY'S TREATMENT: Luther/HM - taping and instructing pt on taping practices at home.  Ridged achilles taping  Creating, reviewing, and completing below HEP   PATIENT EDUCATION:  POC, diagnosis, prognosis, HEP, and outcome measures.  Pt educated via explanation, demonstration, and handout (HEP).  Pt confirms understanding verbally.   ASTERISK SIGNS   Asterisk Signs Eval (04/14/2022)       Balance 3'' R in rear                                         HOME EXERCISE PROGRAM:  Access Code: [facility] URL: [email or web address] Date: 04/14/2022 Prepared by: Shearon Balo  Exercises - Seated Calf Stretch with Strap  - 3 x daily - 7 x weekly - 1 sets - 3 reps - 1 minute hold - Seated Ankle Plantarflexion with Resistance  - 1 x daily - 7 x weekly - 3 sets - 10 reps  ASSESSMENT:  CLINICAL IMPRESSION: Aundraya is a 66 y.o. female who presents to clinic with signs and sxs consistent with L ankle pain secondary to L insertional achilles tendiopathy.  There is some DF ROM deficit, but this is not really  significant compared to her L.  I issued a heel lift and ridged taped her ankle (posterior to limit DF) today.  Will gauge effect and likely try ionto next visit.  OBJECTIVE IMPAIRMENTS: Pain, ankle ROM, ankle strength, hip strength, balance, gait  ACTIVITY LIMITATIONS: walking, standing, stairs  PERSONAL FACTORS: See medical history and pertinent history   REHAB POTENTIAL: Good  CLINICAL DECISION MAKING: Stable/uncomplicated  EVALUATION COMPLEXITY: Low   GOALS:   SHORT TERM GOALS: Target date: 05/05/2022  Eliana will be >75% HEP compliant to improve carryover between sessions and facilitate independent management of condition  Evaluation (04/14/2022): ongoing Goal status: INITIAL   LONG TERM GOALS: Target date: 06/09/2022  Hilary will improve FOTO score to 58 as a proxy for functional improvement  Evaluation/Baseline (04/14/2022): 52 Goal status: INITIAL   2.  Marley will be able to walk for 30 min, not limited by pain   Evaluation/Baseline (04/14/2022): 5-10 min Goal status: INITIAL   3.  Jariana will be able to perform 3x15 heel raises on step, not limited by pain  Evaluation/Baseline (04/14/2022): limited by pain and strength Goal status: INITIAL   4.  [place]ah will be able to stand for >45'' in tandem stance stance, to show a significant improvement in balance in order to reduce fall risk   Evaluation/Baseline (04/14/2022): 3'', R in rear Goal status: INITIAL    PLAN:  PT FREQUENCY: 1-2x/week  PT DURATION: 8 weeks (Ending 06/09/2022)  PLANNED INTERVENTIONS: Therapeutic exercises, Aquatic therapy, Therapeutic activity, Neuro Muscular re-education, Gait training, Patient/Family education, Joint mobilization, Dry Needling, Electrical stimulation, Spinal mobilization and/or manipulation, Moist heat, Taping, Vasopneumatic device, Ionotophoresis '4mg'$ /ml Dexamethasone, and Manual therapy  PLAN FOR NEXT SESSION: progressive ankle strengthening, ionto, taping, hip and core  strengthening    Shearon Balo PT, DPT 04/14/2022, 12:12 PM

## 2022-04-17 DIAGNOSIS — R202 Paresthesia of skin: Secondary | ICD-10-CM | POA: Diagnosis not present

## 2022-04-17 DIAGNOSIS — R42 Dizziness and giddiness: Secondary | ICD-10-CM | POA: Diagnosis not present

## 2022-04-19 ENCOUNTER — Ambulatory Visit: Payer: Medicare Other | Admitting: Physical Therapy

## 2022-04-19 ENCOUNTER — Encounter: Payer: Self-pay | Admitting: Physical Therapy

## 2022-04-19 DIAGNOSIS — M25571 Pain in right ankle and joints of right foot: Secondary | ICD-10-CM | POA: Diagnosis not present

## 2022-04-19 DIAGNOSIS — R600 Localized edema: Secondary | ICD-10-CM | POA: Diagnosis not present

## 2022-04-19 DIAGNOSIS — M7661 Achilles tendinitis, right leg: Secondary | ICD-10-CM | POA: Diagnosis not present

## 2022-04-19 DIAGNOSIS — M21861 Other specified acquired deformities of right lower leg: Secondary | ICD-10-CM | POA: Diagnosis not present

## 2022-04-19 DIAGNOSIS — R262 Difficulty in walking, not elsewhere classified: Secondary | ICD-10-CM | POA: Diagnosis not present

## 2022-04-19 NOTE — Therapy (Signed)
OUTPATIENT PHYSICAL THERAPY TREATMENT NOTE   Patient Name: [name] MRN: [id number] DOB:01-Apr-1956, 66 y.o., female Today's Date: 04/20/2022  PCP: Rita Ohara, MD REFERRING PROVIDER:  [name]   PT End of Session - 04/19/22 [place]     Visit Number 2    Date for PT Re-Evaluation 06/09/22   1-2x/week   Authorization Type [employer] Medicare - FOTO    Progress Note Due on Visit 10    PT Start Time 0630    PT Stop Time 0710    PT Time Calculation (min) 40 min             Past Medical History:  Diagnosis Date   Anemia    previously followed by Dr. Jamse Arn for anemia and elevated platelets   Anxiety    C. difficile colitis 10/01/2012   treated by [facility]   Chronic fatigue syndrome    Closed wedge compression fracture of T8 vertebra ([place]) 06/2021   DDD (degenerative disc disease), lumbar 08/19/2014   and facet arthroplasty & left lumbar radiculopathy (Dr.Ramos)   Depression    Dyssynergia    dyssynergenic defecation, contributing to fecal incontinence.   Edema    Fibromyalgia    Gastroparesis    followed at [facility]   GERD (gastroesophageal reflux disease)    History of kidney stones    History of vertebral fracture 06/30/2021   Hyperlipidemia    Kidney stone    Lumbar radiculopathy    Migraine    Neuropathy    Obstructive sleep apnea    Does  wear  CPAP   Osteoporosis    Paresthesia    Dr. Everette Rank at [facility]   Pelvic floor dysfunction    pelvic floor dyssynergy   Plantar fasciitis 02/2011   R foot   Pneumonia    2012   PONV (postoperative nausea and vomiting)    pt states has gastroparesis has difficulty taking antibiotics and narcotics has severe nausea and vomiting    Restless leg syndrome    S/P endometrial ablation 08/09/2006   Novasure Ablation   S/P epidural steroid injection 09/20/2014   Dr.Ramos   Tremor    Dr. Everette Rank   Urinary frequency    Urinary incontinence    Past Surgical History:  Procedure Laterality Date   CHOLECYSTECTOMY   07/2004   ENDOMETRIAL ABLATION  08/09/2006   Dr. Valentina Shaggy Ablation   FACET JOINT INJECTION  04/17/2017   Left L4-5 and L5-S1   GASTRIC ROUX-EN-Y N/A 09/29/2019   Procedure: LAPAROSCOPIC ROUX-EN-Y GASTRIC BYPASS WITH UPPER ENDOSCOPY, ERAS Pathway;  Surgeon: Johnathan Hausen, MD;  [facility];  Service: General;  Laterality: N/A;   [place]   R knee, Dr. Eddie Dibbles, torn cartilage   KYPHOPLASTY  11/06/2021   T8-T9-Nundkumar   RETINAL LASER PROCEDURE Right 08/28/2018   laser retinopexy   RIGHT/LEFT HEART CATH AND CORONARY ANGIOGRAPHY N/A 01/01/2018   Procedure: RIGHT/LEFT HEART CATH AND CORONARY ANGIOGRAPHY;  Surgeon: Sherren Mocha, MD;  Location: [place] [facility];  Service: Cardiovascular;  Laterality: N/A;   TONSILLECTOMY  1968   TONSILLECTOMY     TOTAL KNEE ARTHROPLASTY Right 09/05/2015   Procedure: RIGHT TOTAL KNEE ARTHROPLASTY;  Surgeon: Gaynelle Arabian, MD;  [facility];  Service: Orthopedics;  Laterality: Right;   TOTAL KNEE ARTHROPLASTY Left 07/14/2018   Procedure: LEFT TOTAL KNEE ARTHROPLASTY;  Surgeon: Gaynelle Arabian, MD;  [facility];  Service: Orthopedics;  Laterality: Left;  ULTRASOUND GUIDANCE FOR VASCULAR ACCESS  01/01/2018   Procedure: Ultrasound Guidance For Vascular Access;  Surgeon: Sherren Mocha, MD;  Location: [place] [facility];  Service: Cardiovascular;;   UMBILICAL HERNIA REPAIR N/A 09/29/2019   Procedure: HERNIA REPAIR UMBILICAL ADULT;  Surgeon: Johnathan Hausen, MD;  [facility];  Service: General;  Laterality: N/A;   Patient Active Problem List   Diagnosis Date Noted   Osteopenia 08/04/2021   Acute right-sided weakness 01/22/2021   Peripheral polyneuropathy 01/22/2021   History of migraine headaches 01/22/2021   Difficulty with speech    Gastric bypass status for obesity 09/29/2019   Aftercare 08/12/2018   Pain in left knee 06/03/2018   Dyspnea 12/26/2017   Restrictive lung disease secondary to  obesity 12/26/2017   Atypical chest pain 12/06/2017   Sinus tachycardia 12/06/2017   Chest pain 12/06/2017   DJD (degenerative joint disease), cervical 12/24/2016   Primary osteoarthritis of both hips 12/24/2016   Primary osteoarthritis of both knees 12/24/2016   H/O total knee replacement, right 12/24/2016   Spondylosis of lumbar region without myelopathy or radiculopathy 12/24/2016   Lumbosacral spondylosis without myelopathy 12/24/2016   Acute gout 05/30/2016   Gout 05/30/2016   Myalgia 03/29/2016   Other long term (current) drug therapy 03/29/2016   Idiopathic peripheral neuropathy 03/29/2016   Cannot sleep 03/29/2016   Migraine without aura and responsive to treatment 03/29/2016   Multifocal myoclonus 03/29/2016   Restless leg 03/29/2016   Has a tremor 03/29/2016   History of aspiration pneumonitis 01/25/2016   History of acute bronchitis 01/25/2016   LPRD (laryngopharyngeal reflux disease) 01/25/2016   Imbalance 01/09/2016   Serotonin syndrome 12/22/2015   Essential hypertension 09/10/2015   OA (osteoarthritis) of knee 09/05/2015   Obesity 05/17/2015   OSA (obstructive sleep apnea) 03/16/2013   Iron deficiency 11/06/2012   Thrombocythemia 11/06/2012   Leukocytosis 11/05/2012   Impaired fasting glucose 07/23/2012   Bronchitis 04/03/2012   Kidney stone on left side 03/05/2012   Kidney cysts 03/04/2012   Loose stools 03/04/2012   Urinary frequency 12/04/2011   Restless leg syndrome 12/04/2011   Polypharmacy 12/04/2011   Bladder incontinence 12/04/2011   Fibromyalgia    Mixed hyperlipidemia    Edema    S/P endometrial ablation    Allergic rhinitis 09/18/2011   Depression, major, single episode, in partial remission ([place]) 04/21/2011   PRECORDIAL PAIN 02/17/2010   Anxiety state 01/30/2010   Migraine 01/30/2010   GERD without esophagitis 01/30/2010   Gastroparesis 01/30/2010    THERAPY DIAG:  Difficulty in walking, not elsewhere classified  Pain in right ankle  and joints of right foot  Localized edema  REFERRING DIAG: Referral diagnosis: Achilles tendinitis, right leg [M76.61], Gastrocnemius equinus of right lower extremity [M21.861]  PERTINENT HISTORY: Chronic fatigue, T8 compression fx (2022), Osteoporosis, plantar fasciitis, idiopathic neuropathy (bil feet), fibromyalgia, R great toe pain  PRECAUTIONS/RESTRICTIONS:   Fall  SUBJECTIVE:  Pt reports that she had severe pain on Sunday.  She feels that the boot may make this worse.    Pain:  Are you having pain? Yes Pain location: R achilles tendon (insertional?) NPRS scale:  current 3/10  Aggravating factors: walking, standing, activity Relieving factors: resting, ice Pain description: intermittent, constant, sharp, and aching Stage: Chronic  OBJECTIVE:   GENERAL OBSERVATION/GAIT:                     Antalgic gait with reduced time in stance R.  Low arch bil with navicular drop  SENSATION:          Light touch: Deficits bil feet   PALPATION: TTP calcaneal insertion of R achilles   LE MMT:   MMT Right 04/14/2022 Left 04/14/2022  Hip flexion (L2, L3) N (ratcheting) n  Knee extension (L3) n n  Knee flexion n n  Hip abduction      Hip extension      Hip external rotation      Hip internal rotation      Hip adduction      Ankle dorsiflexion (L4)      Ankle plantarflexion (S1) D D  Ankle inversion      Ankle eversion      Great Toe ext (L5)      Grossly        (Blank rows = not tested, score listed is out of 5 possible points.  N = WNL, D = diminished, C = clear for gross weakness with myotome testing, * = concordant pain with testing)   LE ROM:   ROM Right 04/14/2022 Left 04/14/2022  Hip flexion      Hip extension      Hip abduction 3+ 4  Hip adduction      Hip internal rotation      Hip external rotation      Knee flexion      Knee extension      Ankle dorsiflexion 5 5  Ankle plantarflexion      Ankle inversion      Ankle eversion        (Blank rows = not  tested, N = WNL, * = concordant pain with testing)   Functional Tests   Eval (04/14/2022)      Progressive balance screen (highest level completed for >/= 10''):   Tandem: R in rear 10'', L in rear 2''                                                                                                       PATIENT SURVEYS:  FOTO 52 -> 58     TODAY'S TREATMENT: [place]/HM - taping and instructing pt on taping practices at home.  Ridged achilles taping   Creating, reviewing, and completing below HEP     PATIENT EDUCATION:  POC, diagnosis, prognosis, HEP, and outcome measures.  Pt educated via explanation, demonstration, and handout (HEP).  Pt confirms understanding verbally.    ASTERISK SIGNS     Asterisk Signs Eval (04/14/2022)            Balance 3'' R in rear                                                                            HOME EXERCISE PROGRAM:   Access Code: [facility] URL: [email or web address] Date: 04/14/2022 Prepared by:  Ladue   Exercises - Seated Calf Stretch with Strap  - 3 x daily - 7 x weekly - 1 sets - 3 reps - 1 minute hold - Seated Ankle Plantarflexion with Resistance  - 1 x daily - 7 x weekly - 3 sets - 10 reps   TREATMENT 5/25:  Therapeutic Exercise: - Bike L3 59mwhile taking subjective and planning session with patient - Towel slide inv/eversion - towel scrunch - 3x - BAPS board L 3 inv/ev (difficult) DF/PF 20x - PF black TB 3x15 - ankle 3 way GTB  Iontophoresis:  - 1 ml dexamethasone - 4-6 hour slow release patch - placed R ankle   ASSESSMENT:   CLINICAL IMPRESSION: [place] did well with therapy.  We concentrated on ankle ROM, foot intrinsic strengthening, and progressive loading of the achilles tendon to good effect.  Black TB issued for PF at home which did illicit some stress in insertion in clinic today.  Ionto trialed and we will gauge response next visit.   OBJECTIVE IMPAIRMENTS: Pain, ankle  ROM, ankle strength, hip strength, balance, gait   ACTIVITY LIMITATIONS: walking, standing, stairs   PERSONAL FACTORS: See medical history and pertinent history     REHAB POTENTIAL: Good   CLINICAL DECISION MAKING: Stable/uncomplicated   EVALUATION COMPLEXITY: Low     GOALS:     SHORT TERM GOALS: Target date: 05/05/2022   PHellonwill be >75% HEP compliant to improve carryover between sessions and facilitate independent management of condition   Evaluation (04/14/2022): ongoing Goal status: INITIAL     LONG TERM GOALS: Target date: 06/09/2022   PAlenewill improve FOTO score to 58 as a proxy for functional improvement   Evaluation/Baseline (04/14/2022): 52 Goal status: INITIAL     2.  PRomandawill be able to walk for 30 min, not limited by pain    Evaluation/Baseline (04/14/2022): 5-10 min Goal status: INITIAL     3.  PLayniwill be able to perform 3x15 heel raises on step, not limited by pain   Evaluation/Baseline (04/14/2022): limited by pain and strength Goal status: INITIAL     4.  PBeewill be able to stand for >45'' in tandem stance stance, to show a significant improvement in balance in order to reduce fall risk    Evaluation/Baseline (04/14/2022): 3'', R in rear Goal status: INITIAL       PLAN: PT FREQUENCY: 1-2x/week   PT DURATION: 8 weeks (Ending 06/09/2022)   PLANNED INTERVENTIONS: Therapeutic exercises, Aquatic therapy, Therapeutic activity, Neuro Muscular re-education, Gait training, Patient/Family education, Joint mobilization, Dry Needling, Electrical stimulation, Spinal mobilization and/or manipulation, Moist heat, Taping, Vasopneumatic device, Ionotophoresis '4mg'$ /ml Dexamethasone, and Manual therapy   PLAN FOR NEXT SESSION: progressive ankle strengthening, ionto, taping, hip and core strengthening    KKevan NyReinhartsen PT 04/20/2022, 11:30 AM

## 2022-04-24 ENCOUNTER — Ambulatory Visit: Payer: Medicare Other | Admitting: Physical Therapy

## 2022-04-24 ENCOUNTER — Encounter: Payer: Self-pay | Admitting: Physical Therapy

## 2022-04-24 DIAGNOSIS — R262 Difficulty in walking, not elsewhere classified: Secondary | ICD-10-CM | POA: Diagnosis not present

## 2022-04-24 DIAGNOSIS — M21861 Other specified acquired deformities of right lower leg: Secondary | ICD-10-CM | POA: Diagnosis not present

## 2022-04-24 DIAGNOSIS — M25571 Pain in right ankle and joints of right foot: Secondary | ICD-10-CM | POA: Diagnosis not present

## 2022-04-24 DIAGNOSIS — M7661 Achilles tendinitis, right leg: Secondary | ICD-10-CM | POA: Diagnosis not present

## 2022-04-24 DIAGNOSIS — R600 Localized edema: Secondary | ICD-10-CM

## 2022-04-24 NOTE — Therapy (Signed)
OUTPATIENT PHYSICAL THERAPY TREATMENT NOTE   Patient Name: [name] MRN: [id number] DOB:26-Jun-1956, 66 y.o., female Today's Date: 04/24/2022  PCP: Rita Ohara, MD REFERRING PROVIDER:  [name]   PT End of Session - 04/24/22 1344     Visit Number 3    Date for PT Re-Evaluation 06/09/22   1-2x/week   Authorization Type [employer] Medicare - FOTO    Progress Note Due on Visit 10    PT Start Time 9983    PT Stop Time 1425    PT Time Calculation (min) 40 min             Past Medical History:  Diagnosis Date   Anemia    previously followed by Dr. Jamse Arn for anemia and elevated platelets   Anxiety    C. difficile colitis 10/01/2012   treated by [facility]   Chronic fatigue syndrome    Closed wedge compression fracture of T8 vertebra ([place]) 06/2021   DDD (degenerative disc disease), lumbar 08/19/2014   and facet arthroplasty & left lumbar radiculopathy (Dr.Ramos)   Depression    Dyssynergia    dyssynergenic defecation, contributing to fecal incontinence.   Edema    Fibromyalgia    Gastroparesis    followed at [facility]   GERD (gastroesophageal reflux disease)    History of kidney stones    History of vertebral fracture 06/30/2021   Hyperlipidemia    Kidney stone    Lumbar radiculopathy    Migraine    Neuropathy    Obstructive sleep apnea    Does  wear  CPAP   Osteoporosis    Paresthesia    Dr. Everette Rank at [facility]   Pelvic floor dysfunction    pelvic floor dyssynergy   Plantar fasciitis 02/2011   R foot   Pneumonia    2012   PONV (postoperative nausea and vomiting)    pt states has gastroparesis has difficulty taking antibiotics and narcotics has severe nausea and vomiting    Restless leg syndrome    S/P endometrial ablation 08/09/2006   Novasure Ablation   S/P epidural steroid injection 09/20/2014   Dr.Ramos   Tremor    Dr. Everette Rank   Urinary frequency    Urinary incontinence    Past Surgical History:  Procedure Laterality Date   CHOLECYSTECTOMY   07/2004   ENDOMETRIAL ABLATION  08/09/2006   Dr. Valentina Shaggy Ablation   FACET JOINT INJECTION  04/17/2017   Left L4-5 and L5-S1   GASTRIC ROUX-EN-Y N/A 09/29/2019   Procedure: LAPAROSCOPIC ROUX-EN-Y GASTRIC BYPASS WITH UPPER ENDOSCOPY, ERAS Pathway;  Surgeon: Johnathan Hausen, MD;  [facility];  Service: General;  Laterality: N/A;   [place]   R knee, Dr. Eddie Dibbles, torn cartilage   KYPHOPLASTY  11/06/2021   T8-T9-Nundkumar   RETINAL LASER PROCEDURE Right 08/28/2018   laser retinopexy   RIGHT/LEFT HEART CATH AND CORONARY ANGIOGRAPHY N/A 01/01/2018   Procedure: RIGHT/LEFT HEART CATH AND CORONARY ANGIOGRAPHY;  Surgeon: Sherren Mocha, MD;  Location: [place] [facility];  Service: Cardiovascular;  Laterality: N/A;   TONSILLECTOMY  1968   TONSILLECTOMY     TOTAL KNEE ARTHROPLASTY Right 09/05/2015   Procedure: RIGHT TOTAL KNEE ARTHROPLASTY;  Surgeon: Gaynelle Arabian, MD;  [facility];  Service: Orthopedics;  Laterality: Right;   TOTAL KNEE ARTHROPLASTY Left 07/14/2018   Procedure: LEFT TOTAL KNEE ARTHROPLASTY;  Surgeon: Gaynelle Arabian, MD;  [facility];  Service: Orthopedics;  Laterality: Left;  ULTRASOUND GUIDANCE FOR VASCULAR ACCESS  01/01/2018   Procedure: Ultrasound Guidance For Vascular Access;  Surgeon: Sherren Mocha, MD;  Location: [place] [facility];  Service: Cardiovascular;;   UMBILICAL HERNIA REPAIR N/A 09/29/2019   Procedure: HERNIA REPAIR UMBILICAL ADULT;  Surgeon: Johnathan Hausen, MD;  [facility];  Service: General;  Laterality: N/A;   Patient Active Problem List   Diagnosis Date Noted   Osteopenia 08/04/2021   Acute right-sided weakness 01/22/2021   Peripheral polyneuropathy 01/22/2021   History of migraine headaches 01/22/2021   Difficulty with speech    Gastric bypass status for obesity 09/29/2019   Aftercare 08/12/2018   Pain in left knee 06/03/2018   Dyspnea 12/26/2017   Restrictive lung disease secondary to  obesity 12/26/2017   Atypical chest pain 12/06/2017   Sinus tachycardia 12/06/2017   Chest pain 12/06/2017   DJD (degenerative joint disease), cervical 12/24/2016   Primary osteoarthritis of both hips 12/24/2016   Primary osteoarthritis of both knees 12/24/2016   H/O total knee replacement, right 12/24/2016   Spondylosis of lumbar region without myelopathy or radiculopathy 12/24/2016   Lumbosacral spondylosis without myelopathy 12/24/2016   Acute gout 05/30/2016   Gout 05/30/2016   Myalgia 03/29/2016   Other long term (current) drug therapy 03/29/2016   Idiopathic peripheral neuropathy 03/29/2016   Cannot sleep 03/29/2016   Migraine without aura and responsive to treatment 03/29/2016   Multifocal myoclonus 03/29/2016   Restless leg 03/29/2016   Has a tremor 03/29/2016   History of aspiration pneumonitis 01/25/2016   History of acute bronchitis 01/25/2016   LPRD (laryngopharyngeal reflux disease) 01/25/2016   Imbalance 01/09/2016   Serotonin syndrome 12/22/2015   Essential hypertension 09/10/2015   OA (osteoarthritis) of knee 09/05/2015   Obesity 05/17/2015   OSA (obstructive sleep apnea) 03/16/2013   Iron deficiency 11/06/2012   Thrombocythemia 11/06/2012   Leukocytosis 11/05/2012   Impaired fasting glucose 07/23/2012   Bronchitis 04/03/2012   Kidney stone on left side 03/05/2012   Kidney cysts 03/04/2012   Loose stools 03/04/2012   Urinary frequency 12/04/2011   Restless leg syndrome 12/04/2011   Polypharmacy 12/04/2011   Bladder incontinence 12/04/2011   Fibromyalgia    Mixed hyperlipidemia    Edema    S/P endometrial ablation    Allergic rhinitis 09/18/2011   Depression, major, single episode, in partial remission ([place]) 04/21/2011   PRECORDIAL PAIN 02/17/2010   Anxiety state 01/30/2010   Migraine 01/30/2010   GERD without esophagitis 01/30/2010   Gastroparesis 01/30/2010    THERAPY DIAG:  Difficulty in walking, not elsewhere classified  Pain in right ankle  and joints of right foot  Localized edema  REFERRING DIAG: Referral diagnosis: Achilles tendinitis, right leg [M76.61], Gastrocnemius equinus of right lower extremity [M21.861]  PERTINENT HISTORY: Chronic fatigue, T8 compression fx (2022), Osteoporosis, plantar fasciitis, idiopathic neuropathy (bil feet), fibromyalgia, R great toe pain  PRECAUTIONS/RESTRICTIONS:   Fall  SUBJECTIVE:  Pt reports that she thinks the ionto patch was helpful.  She reports continued high levels of pain over the weekend.  Pain:  Are you having pain? Yes Pain location: R achilles tendon (insertional?) NPRS scale:  current 2/10  Aggravating factors: walking, standing, activity Relieving factors: resting, ice Pain description: intermittent, constant, sharp, and aching Stage: Chronic  OBJECTIVE:   GENERAL OBSERVATION/GAIT:                     Antalgic gait with reduced time in stance R.  Low arch bil with navicular drop  SENSATION:          Light touch: Deficits bil feet   PALPATION: TTP calcaneal insertion of R achilles   LE MMT:   MMT Right 04/14/2022 Left 04/14/2022  Hip flexion (L2, L3) N (ratcheting) n  Knee extension (L3) n n  Knee flexion n n  Hip abduction      Hip extension      Hip external rotation      Hip internal rotation      Hip adduction      Ankle dorsiflexion (L4)      Ankle plantarflexion (S1) D D  Ankle inversion      Ankle eversion      Great Toe ext (L5)      Grossly        (Blank rows = not tested, score listed is out of 5 possible points.  N = WNL, D = diminished, C = clear for gross weakness with myotome testing, * = concordant pain with testing)   LE ROM:   ROM Right 04/14/2022 Left 04/14/2022  Hip flexion      Hip extension      Hip abduction 3+ 4  Hip adduction      Hip internal rotation      Hip external rotation      Knee flexion      Knee extension      Ankle dorsiflexion 5 5  Ankle plantarflexion      Ankle inversion      Ankle eversion         (Blank rows = not tested, N = WNL, * = concordant pain with testing)   Functional Tests   Eval (04/14/2022)      Progressive balance screen (highest level completed for >/= 10''):   Tandem: R in rear 10'', L in rear 2''                                                                                                       PATIENT SURVEYS:  FOTO 52 -> 58     TODAY'S TREATMENT: [place]/HM - taping and instructing pt on taping practices at home.  Ridged achilles taping   Creating, reviewing, and completing below HEP     PATIENT EDUCATION:  POC, diagnosis, prognosis, HEP, and outcome measures.  Pt educated via explanation, demonstration, and handout (HEP).  Pt confirms understanding verbally.    ASTERISK SIGNS     Asterisk Signs Eval (04/14/2022) 5/30           Balance 3'' R in rear 30'' R semi tandem                                                                           HOME EXERCISE PROGRAM:   Access Code: [facility] URL: [email or web address] Date:  04/14/2022 Prepared by: Shearon Balo   Exercises - Seated Calf Stretch with Strap  - 3 x daily - 7 x weekly - 1 sets - 3 reps - 1 minute hold - Seated Ankle Plantarflexion with Resistance  - 1 x daily - 7 x weekly - 3 sets - 10 reps    TREATMENT 5/30:  Therapeutic Exercise: - Bike L3 18mwhile taking subjective and planning session with patient - knee ext machine - 15# - 3x10 - knee flexion machine - 25# - 3x10 - hip abduction machine -  17.5# - 3x10  Iontophoresis:  - 1 ml dexamethasone - 4-6 hour slow release patch - placed R ankle   ASSESSMENT:   CLINICAL IMPRESSION: Romey did well with therapy.  We were able to incorporate some eccentric heel raises on small step to good effect.  While achilles attachment does appear to be a pain generator there is also a component on the plantar surface of her foot that may be coming from her neuropathy.   OBJECTIVE IMPAIRMENTS: Pain, ankle  ROM, ankle strength, hip strength, balance, gait   ACTIVITY LIMITATIONS: walking, standing, stairs   PERSONAL FACTORS: See medical history and pertinent history     REHAB POTENTIAL: Good   CLINICAL DECISION MAKING: Stable/uncomplicated   EVALUATION COMPLEXITY: Low     GOALS:     SHORT TERM GOALS: Target date: 05/05/2022   PSherrynwill be >75% HEP compliant to improve carryover between sessions and facilitate independent management of condition   Evaluation (04/14/2022): ongoing Goal status: INITIAL     LONG TERM GOALS: Target date: 06/09/2022   PSidneewill improve FOTO score to 58 as a proxy for functional improvement   Evaluation/Baseline (04/14/2022): 52 Goal status: INITIAL     2.  PSeilawill be able to walk for 30 min, not limited by pain    Evaluation/Baseline (04/14/2022): 5-10 min Goal status: INITIAL     3.  PKatyanawill be able to perform 3x15 heel raises on step, not limited by pain   Evaluation/Baseline (04/14/2022): limited by pain and strength Goal status: INITIAL     4.  PSloanewill be able to stand for >45'' in tandem stance stance, to show a significant improvement in balance in order to reduce fall risk    Evaluation/Baseline (04/14/2022): 3'', R in rear Goal status: INITIAL       PLAN: PT FREQUENCY: 1-2x/week   PT DURATION: 8 weeks (Ending 06/09/2022)   PLANNED INTERVENTIONS: Therapeutic exercises, Aquatic therapy, Therapeutic activity, Neuro Muscular re-education, Gait training, Patient/Family education, Joint mobilization, Dry Needling, Electrical stimulation, Spinal mobilization and/or manipulation, Moist heat, Taping, Vasopneumatic device, Ionotophoresis '4mg'$ /ml Dexamethasone, and Manual therapy   PLAN FOR NEXT SESSION: progressive ankle strengthening, ionto, taping, hip and core strengthening    KKevan NyReinhartsen PT 04/24/2022, 3:39 PM

## 2022-04-26 ENCOUNTER — Ambulatory Visit: Payer: Medicare Other | Admitting: Podiatry

## 2022-04-26 DIAGNOSIS — M62461 Contracture of muscle, right lower leg: Secondary | ICD-10-CM

## 2022-04-26 DIAGNOSIS — M9261 Juvenile osteochondrosis of tarsus, right ankle: Secondary | ICD-10-CM | POA: Diagnosis not present

## 2022-04-26 DIAGNOSIS — M109 Gout, unspecified: Secondary | ICD-10-CM

## 2022-04-26 DIAGNOSIS — M7661 Achilles tendinitis, right leg: Secondary | ICD-10-CM | POA: Diagnosis not present

## 2022-04-26 DIAGNOSIS — M21861 Other specified acquired deformities of right lower leg: Secondary | ICD-10-CM

## 2022-04-26 MED ORDER — TRAMADOL HCL 50 MG PO TABS: 50.0000 mg | ORAL_TABLET | Freq: Four times a day (QID) | ORAL | 0 refills | Status: AC | PRN

## 2022-04-26 NOTE — Progress Notes (Signed)
  Subjective:  Patient ID: [name], female    DOB: 06-14-1956,  MRN: [id number]  Chief Complaint  Patient presents with   Tendonitis    Follow up achilles right    66 y.o. female presents with the above complaint. History confirmed with patient.  She returns today for surgical planning visit for the right great toe arthritis from her gout as well as follow-up on her Achilles tendinitis.  The Achilles tendon is still been very painful for her and is not getting much better.  She not taking any of the anti-inflammatory because she discussed with her primary and that it may worsen her gastric reflux disease   Interval history: Since last visit she has started formal physical therapy, has not had many sessions just yet but has not noticed a whole lot of improvement in the Achilles is still quite painful.  Sometimes says the boot makes it hurt worse.  Her husband is here with her today and would like to discuss the surgery that she is having  Objective:  Physical Exam: warm, good capillary refill, no trophic changes or ulcerative lesions, normal DP and PT pulses, normal sensory exam, and ulcer at the tip of the toe has completely healed.  Severe arthrosis of the IPJ of the right hallux with deformity and edema she has pain and tenderness in the mid substance of the Achilles tendon as well as the insertion now.  Gastrocnemius equinus is noted  X-ray 12/20/2021 showed significant arthritis of the IPJ of the right hallux, Haglund deformity as well as minimal calcaneal spurring Assessment:   1. Achilles tendinitis, right leg   2. Haglund's deformity, right   3. Gastrocnemius equinus of right lower extremity   4. Gouty arthritis of toe of right foot      Plan:  Patient was evaluated and treated and all questions answered.    So far has not had much improvement with home physical therapy, boot immobilization and anti-inflammatories for the Achilles she is using topical Voltaren gel without  relief.  I recommended an MRI to evaluate the Achilles tendon for any possible micro tearing or inflammation in the Haglund deformity retrocalcaneal bursitis.  This was ordered and she will follow-up with me after the MRI   Today we again discussed the plan for her IPJ arthrodesis.  I reviewed the process and postoperative course with her and her husband again.  Surgery scheduled now for August.  Hopefully Achilles is better by that point may consider adding a minimally invasive procedure such as Topaz and PRP injection in the Achilles at the time of surgery pending results of the MRI which I discussed with him.    Return in about 6 weeks (around 06/05/2022) for after MRI to review, re-check Achilles tendon.

## 2022-04-27 ENCOUNTER — Encounter (HOSPITAL_COMMUNITY): Payer: Self-pay | Admitting: *Deleted

## 2022-04-30 ENCOUNTER — Telehealth: Payer: Self-pay | Admitting: Family Medicine

## 2022-04-30 NOTE — Telephone Encounter (Signed)
Pt called and states that she has another sinus infection. She has then frequently and her last one was treated by [facility] on 03/23/2022. Pt wanted a message back due to her history, Pt can be reached at [phone] and uses [facility] on [address].

## 2022-05-01 ENCOUNTER — Encounter: Payer: Self-pay | Admitting: Family Medicine

## 2022-05-01 ENCOUNTER — Ambulatory Visit: Payer: Medicare Other | Attending: Family Medicine | Admitting: Physical Therapy

## 2022-05-01 ENCOUNTER — Ambulatory Visit (INDEPENDENT_AMBULATORY_CARE_PROVIDER_SITE_OTHER): Payer: Medicare Other | Admitting: Family Medicine

## 2022-05-01 ENCOUNTER — Encounter: Payer: Self-pay | Admitting: Physical Therapy

## 2022-05-01 VITALS — BP 108/72 | HR 79 | Temp 96.8°F | Wt 187.0 lb

## 2022-05-01 DIAGNOSIS — R262 Difficulty in walking, not elsewhere classified: Secondary | ICD-10-CM | POA: Diagnosis not present

## 2022-05-01 DIAGNOSIS — R600 Localized edema: Secondary | ICD-10-CM | POA: Diagnosis not present

## 2022-05-01 DIAGNOSIS — M25571 Pain in right ankle and joints of right foot: Secondary | ICD-10-CM | POA: Diagnosis not present

## 2022-05-01 DIAGNOSIS — J069 Acute upper respiratory infection, unspecified: Secondary | ICD-10-CM | POA: Diagnosis not present

## 2022-05-01 NOTE — Progress Notes (Signed)
   Subjective:    Patient ID: [name], female    DOB: Aug 12, 1956, 66 y.o.   MRN: [id number]  HPI Here for evaluation of a 4-day history of started with nasal congestion, right eye discomfort, intermittent sore throat and hoarse voice but no fever, chills, chest congestion, PND, cough earache.  She states today she does feel slightly better.  Review of the record indicates she did have difficulty in February as well as in April and was initially given Amoxil and in April switched to Augmentin.  She apparently did get better from these.   Review of Systems     Objective:   Physical Exam Alert and in no distress.  Slight tenderness to the right maxillary sinus area.  Tympanic membranes and canals are normal. Pharyngeal area is normal. Neck is supple without adenopathy or thyromegaly. Cardiac exam shows a regular sinus rhythm without murmurs or gallops. Lungs are clear to auscultation.        Assessment & Plan:  Viral URI Since she is feeling better I explained that this is probably a viral illness and that should go away on its own.  She expressed understanding of this.  Recommend she continue with OTC treatment.

## 2022-05-01 NOTE — Therapy (Signed)
OUTPATIENT PHYSICAL THERAPY TREATMENT NOTE   Patient Name: [name] MRN: [id number] DOB:November 22, 1956, 66 y.o., female Today's Date: 05/01/2022  PCP: Rita Ohara, MD REFERRING PROVIDER:  [name]   PT End of Session - 05/01/22 1530     Visit Number 4    Date for PT Re-Evaluation 06/09/22   1-2x/week   Authorization Type [employer] - FOTO    Progress Note Due on Visit 10    PT Start Time 7096    PT Stop Time 1610    PT Time Calculation (min) 40 min             Past Medical History:  Diagnosis Date   Anemia    previously followed by Dr. Jamse Arn for anemia and elevated platelets   Anxiety    C. difficile colitis 10/01/2012   treated by [facility]   Chronic fatigue syndrome    Closed wedge compression fracture of T8 vertebra ([place]) 06/2021   DDD (degenerative disc disease), lumbar 08/19/2014   and facet arthroplasty & left lumbar radiculopathy (Dr.Ramos)   Depression    Dyssynergia    dyssynergenic defecation, contributing to fecal incontinence.   Edema    Fibromyalgia    Gastroparesis    followed at [facility]   GERD (gastroesophageal reflux disease)    History of kidney stones    History of vertebral fracture 06/30/2021   Hyperlipidemia    Kidney stone    Lumbar radiculopathy    Migraine    Neuropathy    Obstructive sleep apnea    Does  wear  CPAP   Osteoporosis    Paresthesia    Dr. Everette Rank at [facility]   Pelvic floor dysfunction    pelvic floor dyssynergy   Plantar fasciitis 02/2011   R foot   Pneumonia    2012   PONV (postoperative nausea and vomiting)    pt states has gastroparesis has difficulty taking antibiotics and narcotics has severe nausea and vomiting    Restless leg syndrome    S/P endometrial ablation 08/09/2006   Novasure Ablation   S/P epidural steroid injection 09/20/2014   Dr.Ramos   Tremor    Dr. Everette Rank   Urinary frequency    Urinary incontinence    Past Surgical History:  Procedure Laterality Date   CHOLECYSTECTOMY   07/2004   ENDOMETRIAL ABLATION  08/09/2006   Dr. Valentina Shaggy Ablation   FACET JOINT INJECTION  04/17/2017   Left L4-5 and L5-S1   GASTRIC ROUX-EN-Y N/A 09/29/2019   Procedure: LAPAROSCOPIC ROUX-EN-Y GASTRIC BYPASS WITH UPPER ENDOSCOPY, ERAS Pathway;  Surgeon: Johnathan Hausen, MD;  Location: [facility];  Service: General;  Laterality: N/A;   [place]   R knee, Dr. Eddie Dibbles, torn cartilage   KYPHOPLASTY  11/06/2021   T8-T9-Nundkumar   RETINAL LASER PROCEDURE Right 08/28/2018   laser retinopexy   RIGHT/LEFT HEART CATH AND CORONARY ANGIOGRAPHY N/A 01/01/2018   Procedure: RIGHT/LEFT HEART CATH AND CORONARY ANGIOGRAPHY;  Surgeon: Sherren Mocha, MD;  Location: [facility];  Service: Cardiovascular;  Laterality: N/A;   TONSILLECTOMY  1968   TONSILLECTOMY     TOTAL KNEE ARTHROPLASTY Right 09/05/2015   Procedure: RIGHT TOTAL KNEE ARTHROPLASTY;  Surgeon: Gaynelle Arabian, MD;  Location: [facility];  Service: Orthopedics;  Laterality: Right;   TOTAL KNEE ARTHROPLASTY Left 07/14/2018   Procedure: LEFT TOTAL KNEE ARTHROPLASTY;  Surgeon: Gaynelle Arabian, MD;  Location: [facility];  Service: Orthopedics;  Laterality: Left;  ULTRASOUND GUIDANCE FOR VASCULAR ACCESS  01/01/2018   Procedure: Ultrasound Guidance For Vascular Access;  Surgeon: Sherren Mocha, MD;  Location: [place] [facility];  Service: Cardiovascular;;   UMBILICAL HERNIA REPAIR N/A 09/29/2019   Procedure: HERNIA REPAIR UMBILICAL ADULT;  Surgeon: Johnathan Hausen, MD;  Location: [facility];  Service: General;  Laterality: N/A;   Patient Active Problem List   Diagnosis Date Noted   Osteopenia 08/04/2021   Acute right-sided weakness 01/22/2021   Peripheral polyneuropathy 01/22/2021   History of migraine headaches 01/22/2021   Difficulty with speech    Gastric bypass status for obesity 09/29/2019   Aftercare 08/12/2018   Pain in left knee 06/03/2018   Dyspnea 12/26/2017   Restrictive lung disease secondary to  obesity 12/26/2017   Atypical chest pain 12/06/2017   Sinus tachycardia 12/06/2017   Chest pain 12/06/2017   DJD (degenerative joint disease), cervical 12/24/2016   Primary osteoarthritis of both hips 12/24/2016   Primary osteoarthritis of both knees 12/24/2016   H/O total knee replacement, right 12/24/2016   Spondylosis of lumbar region without myelopathy or radiculopathy 12/24/2016   Lumbosacral spondylosis without myelopathy 12/24/2016   Acute gout 05/30/2016   Gout 05/30/2016   Myalgia 03/29/2016   Other long term (current) drug therapy 03/29/2016   Idiopathic peripheral neuropathy 03/29/2016   Cannot sleep 03/29/2016   Migraine without aura and responsive to treatment 03/29/2016   Multifocal myoclonus 03/29/2016   Restless leg 03/29/2016   Has a tremor 03/29/2016   History of aspiration pneumonitis 01/25/2016   History of acute bronchitis 01/25/2016   LPRD (laryngopharyngeal reflux disease) 01/25/2016   Imbalance 01/09/2016   Serotonin syndrome 12/22/2015   Essential hypertension 09/10/2015   OA (osteoarthritis) of knee 09/05/2015   Obesity 05/17/2015   OSA (obstructive sleep apnea) 03/16/2013   Iron deficiency 11/06/2012   Thrombocythemia 11/06/2012   Leukocytosis 11/05/2012   Impaired fasting glucose 07/23/2012   Bronchitis 04/03/2012   Kidney stone on left side 03/05/2012   Kidney cysts 03/04/2012   Loose stools 03/04/2012   Urinary frequency 12/04/2011   Restless leg syndrome 12/04/2011   Polypharmacy 12/04/2011   Bladder incontinence 12/04/2011   Fibromyalgia    Mixed hyperlipidemia    Edema    S/P endometrial ablation    Allergic rhinitis 09/18/2011   Depression, major, single episode, in partial remission ([place]) 04/21/2011   PRECORDIAL PAIN 02/17/2010   Anxiety state 01/30/2010   Migraine 01/30/2010   GERD without esophagitis 01/30/2010   Gastroparesis 01/30/2010    THERAPY DIAG:  Difficulty in walking, not elsewhere classified  Pain in right ankle  and joints of right foot  Localized edema  REFERRING DIAG: Referral diagnosis: Achilles tendinitis, right leg [M76.61], Gastrocnemius equinus of right lower extremity [M21.861]  PERTINENT HISTORY: Chronic fatigue, T8 compression fx (2022), Osteoporosis, plantar fasciitis, idiopathic neuropathy (bil feet), fibromyalgia, R great toe pain  PRECAUTIONS/RESTRICTIONS:   Fall  SUBJECTIVE:  Pt reports that she feels she is slowly improving.  She had minimal pain over the weekend  Pain:  Are you having pain? Yes Pain location: R achilles tendon (insertional?) NPRS scale:  current 2/10  Aggravating factors: walking, standing, activity Relieving factors: resting, ice Pain description: intermittent, constant, sharp, and aching Stage: Chronic  OBJECTIVE:   GENERAL OBSERVATION/GAIT:                     Antalgic gait with reduced time in stance R.  Low arch bil with navicular drop   SENSATION:  Light touch: Deficits bil feet   PALPATION: TTP calcaneal insertion of R achilles   LE MMT:   MMT Right 04/14/2022 Left 04/14/2022  Hip flexion (L2, L3) N (ratcheting) n  Knee extension (L3) n n  Knee flexion n n  Hip abduction      Hip extension      Hip external rotation      Hip internal rotation      Hip adduction      Ankle dorsiflexion (L4)      Ankle plantarflexion (S1) D D  Ankle inversion      Ankle eversion      Great Toe ext (L5)      Grossly        (Blank rows = not tested, score listed is out of 5 possible points.  N = WNL, D = diminished, C = clear for gross weakness with myotome testing, * = concordant pain with testing)   LE ROM:   ROM Right 04/14/2022 Left 04/14/2022  Hip flexion      Hip extension      Hip abduction 3+ 4  Hip adduction      Hip internal rotation      Hip external rotation      Knee flexion      Knee extension      Ankle dorsiflexion 5 5  Ankle plantarflexion      Ankle inversion      Ankle eversion        (Blank rows = not  tested, N = WNL, * = concordant pain with testing)   Functional Tests   Eval (04/14/2022)      Progressive balance screen (highest level completed for >/= 10''):   Tandem: R in rear 10'', L in rear 2''                                                                                                       PATIENT SURVEYS:  FOTO 52 -> 58     TODAY'S TREATMENT: [place]/HM - taping and instructing pt on taping practices at home.  Ridged achilles taping   Creating, reviewing, and completing below HEP     PATIENT EDUCATION:  POC, diagnosis, prognosis, HEP, and outcome measures.  Pt educated via explanation, demonstration, and handout (HEP).  Pt confirms understanding verbally.    ASTERISK SIGNS     Asterisk Signs Eval (04/14/2022) 5/30  6/6         Balance 3'' R in rear 30'' R semi tandem  45'' semi tandem (75%) on foam                                                                         HOME EXERCISE PROGRAM:   Access Code: [facility] [place] URL: [email or web address] Date: 04/14/2022 Prepared by: Shearon Balo  Exercises - Seated Calf Stretch with Strap  - 3 x daily - 7 x weekly - 1 sets - 3 reps - 1 minute hold - Seated Ankle Plantarflexion with Resistance  - 1 x daily - 7 x weekly - 3 sets - 10 reps    TREATMENT 6/6:  Therapeutic Exercise: - Bike L3 18mwhile taking subjective and planning session with patient - knee ext machine - 20# - 3x10 - knee flexion machine - 25# - 3x10 - hip abduction machine -  25# - 3x10 - slant board stretch - 45'' x3 - heel raise - 3x10 - step up - 4'' - 2x10   Neuromuscular re-ed: - semi tandem 50% on foam - semi tandem 75% on foam - wooden rocker board df/pf  Iontophoresis:  - 1 ml dexamethasone - 4-6 hour slow release patch - placed R ankle  TREATMENT 5/30:  Therapeutic Exercise: - Bike L3 [id number] taking subjective and planning session with patient - knee ext machine - 15# - 3x10 - knee flexion  machine - 25# - 3x10 - hip abduction machine -  17.5# - 3x10  Iontophoresis:  - 1 ml dexamethasone - 4-6 hour slow release patch - placed R ankle   ASSESSMENT:   CLINICAL IMPRESSION: Cambelle did well with therapy today.  She advance to semi tandem on foam showing improved balance and we were also able to advance to heel raises with more intense loading of achilles.  Were were able to progress to wooden rocker board but she did have some difficulty in achieving full PF.   OBJECTIVE IMPAIRMENTS: Pain, ankle ROM, ankle strength, hip strength, balance, gait   ACTIVITY LIMITATIONS: walking, standing, stairs   PERSONAL FACTORS: See medical history and pertinent history     REHAB POTENTIAL: Good   CLINICAL DECISION MAKING: Stable/uncomplicated   EVALUATION COMPLEXITY: Low     GOALS:     SHORT TERM GOALS: Target date: 05/05/2022   PeYasiraill be >75% HEP compliant to improve carryover between sessions and facilitate independent management of condition   Evaluation (04/14/2022): ongoing Goal status: MET 6/6     LONG TERM GOALS: Target date: 06/09/2022   PeBelinaill improve FOTO score to 58 as a proxy for functional improvement   Evaluation/Baseline (04/14/2022): 52 Goal status: INITIAL     2.  PeDajaneill be able to walk for 30 min, not limited by pain    Evaluation/Baseline (04/14/2022): 5-10 min Goal status: INITIAL     3.  PeLateciaill be able to perform 3x15 heel raises on step, not limited by pain   Evaluation/Baseline (04/14/2022): limited by pain and strength Goal status: INITIAL     4.  PeEtherill be able to stand for >45'' in tandem stance stance, to show a significant improvement in balance in order to reduce fall risk    Evaluation/Baseline (04/14/2022): 3'', R in rear Goal status: INITIAL       PLAN: PT FREQUENCY: 1-2x/week   PT DURATION: 8 weeks (Ending 06/09/2022)   PLANNED INTERVENTIONS: Therapeutic exercises, Aquatic therapy, Therapeutic activity, Neuro  Muscular re-education, Gait training, Patient/Family education, Joint mobilization, Dry Needling, Electrical stimulation, Spinal mobilization and/or manipulation, Moist heat, Taping, Vasopneumatic device, Ionotophoresis 65m30ml Dexamethasone, and Manual therapy   PLAN FOR NEXT SESSION: progressive ankle strengthening, ionto, taping, hip and core strengthening    KarKevan Nyinhartsen PT 05/01/2022, 4:12 PM

## 2022-05-01 NOTE — Telephone Encounter (Signed)
Pt is coming in at 28 today. Kh

## 2022-05-03 ENCOUNTER — Ambulatory Visit: Payer: Medicare Other

## 2022-05-03 ENCOUNTER — Other Ambulatory Visit: Payer: Self-pay | Admitting: Physician Assistant

## 2022-05-03 DIAGNOSIS — M8000XA Age-related osteoporosis with current pathological fracture, unspecified site, initial encounter for fracture: Secondary | ICD-10-CM

## 2022-05-03 NOTE — Telephone Encounter (Signed)
Next Visit: 06/06/2022  Last Visit: 12/07/2021  Last Fill: 01/09/2022  DX: Age-related osteoporosis with current pathological fracture  Current Dose per office note 12/07/2021: Forteo daily injections as prescribed.  She initiated Forteo on 08/07/2021.   Labs: 02/05/2022 Glucose 101, Alk Phos 225, ALT 33  Okay to refill Forteo?

## 2022-05-08 ENCOUNTER — Ambulatory Visit: Payer: Medicare Other | Admitting: Podiatry

## 2022-05-08 ENCOUNTER — Ambulatory Visit: Payer: Medicare Other | Admitting: Physical Therapy

## 2022-05-08 ENCOUNTER — Encounter: Payer: Self-pay | Admitting: Physical Therapy

## 2022-05-08 DIAGNOSIS — T148XXA Other injury of unspecified body region, initial encounter: Secondary | ICD-10-CM

## 2022-05-08 DIAGNOSIS — R262 Difficulty in walking, not elsewhere classified: Secondary | ICD-10-CM

## 2022-05-08 DIAGNOSIS — R600 Localized edema: Secondary | ICD-10-CM

## 2022-05-08 DIAGNOSIS — M25571 Pain in right ankle and joints of right foot: Secondary | ICD-10-CM | POA: Diagnosis not present

## 2022-05-08 MED ORDER — MUPIROCIN 2 % EX OINT: 1.0000 "application " | TOPICAL_OINTMENT | Freq: Two times a day (BID) | CUTANEOUS | 2 refills | Status: DC

## 2022-05-08 NOTE — Therapy (Signed)
OUTPATIENT PHYSICAL THERAPY TREATMENT NOTE   Patient Name: [name] MRN: [id number] DOB:01-12-1956, 66 y.o., female Today's Date: 05/08/2022  PCP: Rita Ohara, MD REFERRING PROVIDER:  [name]   PT End of Session - 05/08/22 0959     Visit Number 5    Date for PT Re-Evaluation 06/09/22   1-2x/week   Authorization Type [employer] - FOTO    Progress Note Due on Visit 10    PT Start Time 1000    PT Stop Time 1042    PT Time Calculation (min) 42 min             Past Medical History:  Diagnosis Date   Anemia    previously followed by Dr. Jamse Arn for anemia and elevated platelets   Anxiety    C. difficile colitis 10/01/2012   treated by [facility]   Chronic fatigue syndrome    Closed wedge compression fracture of T8 vertebra ([place]) 06/2021   DDD (degenerative disc disease), lumbar 08/19/2014   and facet arthroplasty & left lumbar radiculopathy (Dr.Ramos)   Depression    Dyssynergia    dyssynergenic defecation, contributing to fecal incontinence.   Edema    Fibromyalgia    Gastroparesis    followed at [facility]   GERD (gastroesophageal reflux disease)    History of kidney stones    History of vertebral fracture 06/30/2021   Hyperlipidemia    Kidney stone    Lumbar radiculopathy    Migraine    Neuropathy    Obstructive sleep apnea    Does  wear  CPAP   Osteoporosis    Paresthesia    Dr. Everette Rank at [facility]   Pelvic floor dysfunction    pelvic floor dyssynergy   Plantar fasciitis 02/2011   R foot   Pneumonia    2012   PONV (postoperative nausea and vomiting)    pt states has gastroparesis has difficulty taking antibiotics and narcotics has severe nausea and vomiting    Restless leg syndrome    S/P endometrial ablation 08/09/2006   Novasure Ablation   S/P epidural steroid injection 09/20/2014   Dr.Ramos   Tremor    Dr. Everette Rank   Urinary frequency    Urinary incontinence    Past Surgical History:  Procedure Laterality Date   CHOLECYSTECTOMY   07/2004   ENDOMETRIAL ABLATION  08/09/2006   Dr. Valentina Shaggy Ablation   FACET JOINT INJECTION  04/17/2017   Left L4-5 and L5-S1   GASTRIC ROUX-EN-Y N/A 09/29/2019   Procedure: LAPAROSCOPIC ROUX-EN-Y GASTRIC BYPASS WITH UPPER ENDOSCOPY, ERAS Pathway;  Surgeon: Johnathan Hausen, MD;  Location: [facility];  Service: General;  Laterality: N/A;   [place]   R knee, Dr. Eddie Dibbles, torn cartilage   KYPHOPLASTY  11/06/2021   T8-T9-Nundkumar   RETINAL LASER PROCEDURE Right 08/28/2018   laser retinopexy   RIGHT/LEFT HEART CATH AND CORONARY ANGIOGRAPHY N/A 01/01/2018   Procedure: RIGHT/LEFT HEART CATH AND CORONARY ANGIOGRAPHY;  Surgeon: Sherren Mocha, MD;  Location: [place] [facility];  Service: Cardiovascular;  Laterality: N/A;   TONSILLECTOMY  1968   TONSILLECTOMY     TOTAL KNEE ARTHROPLASTY Right 09/05/2015   Procedure: RIGHT TOTAL KNEE ARTHROPLASTY;  Surgeon: Gaynelle Arabian, MD;  Location: [facility];  Service: Orthopedics;  Laterality: Right;   TOTAL KNEE ARTHROPLASTY Left 07/14/2018   Procedure: LEFT TOTAL KNEE ARTHROPLASTY;  Surgeon: Gaynelle Arabian, MD;  Location: [facility];  Service: Orthopedics;  Laterality: Left;  ULTRASOUND GUIDANCE FOR VASCULAR ACCESS  01/01/2018   Procedure: Ultrasound Guidance For Vascular Access;  Surgeon: Sherren Mocha, MD;  Location: [place] [facility];  Service: Cardiovascular;;   UMBILICAL HERNIA REPAIR N/A 09/29/2019   Procedure: HERNIA REPAIR UMBILICAL ADULT;  Surgeon: Johnathan Hausen, MD;  Location: [facility];  Service: General;  Laterality: N/A;   Patient Active Problem List   Diagnosis Date Noted   Osteopenia 08/04/2021   Acute right-sided weakness 01/22/2021   Peripheral polyneuropathy 01/22/2021   History of migraine headaches 01/22/2021   Difficulty with speech    Gastric bypass status for obesity 09/29/2019   Aftercare 08/12/2018   Pain in left knee 06/03/2018   Dyspnea 12/26/2017   Restrictive lung disease secondary to  obesity 12/26/2017   Atypical chest pain 12/06/2017   Sinus tachycardia 12/06/2017   Chest pain 12/06/2017   DJD (degenerative joint disease), cervical 12/24/2016   Primary osteoarthritis of both hips 12/24/2016   Primary osteoarthritis of both knees 12/24/2016   H/O total knee replacement, right 12/24/2016   Spondylosis of lumbar region without myelopathy or radiculopathy 12/24/2016   Lumbosacral spondylosis without myelopathy 12/24/2016   Acute gout 05/30/2016   Gout 05/30/2016   Myalgia 03/29/2016   Other long term (current) drug therapy 03/29/2016   Idiopathic peripheral neuropathy 03/29/2016   Cannot sleep 03/29/2016   Migraine without aura and responsive to treatment 03/29/2016   Multifocal myoclonus 03/29/2016   Restless leg 03/29/2016   Has a tremor 03/29/2016   History of aspiration pneumonitis 01/25/2016   History of acute bronchitis 01/25/2016   LPRD (laryngopharyngeal reflux disease) 01/25/2016   Imbalance 01/09/2016   Serotonin syndrome 12/22/2015   Essential hypertension 09/10/2015   OA (osteoarthritis) of knee 09/05/2015   Obesity 05/17/2015   OSA (obstructive sleep apnea) 03/16/2013   Iron deficiency 11/06/2012   Thrombocythemia 11/06/2012   Leukocytosis 11/05/2012   Impaired fasting glucose 07/23/2012   Bronchitis 04/03/2012   Kidney stone on left side 03/05/2012   Kidney cysts 03/04/2012   Loose stools 03/04/2012   Urinary frequency 12/04/2011   Restless leg syndrome 12/04/2011   Polypharmacy 12/04/2011   Bladder incontinence 12/04/2011   Fibromyalgia    Mixed hyperlipidemia    Edema    S/P endometrial ablation    Allergic rhinitis 09/18/2011   Depression, major, single episode, in partial remission ([place]) 04/21/2011   PRECORDIAL PAIN 02/17/2010   Anxiety state 01/30/2010   Migraine 01/30/2010   GERD without esophagitis 01/30/2010   Gastroparesis 01/30/2010    THERAPY DIAG:  Difficulty in walking, not elsewhere classified  Pain in right ankle  and joints of right foot  Localized edema  REFERRING DIAG: Referral diagnosis: Achilles tendinitis, right leg [M76.61], Gastrocnemius equinus of right lower extremity [M21.861]  PERTINENT HISTORY: Chronic fatigue, T8 compression fx (2022), Osteoporosis, plantar fasciitis, idiopathic neuropathy (bil feet), fibromyalgia, R great toe pain  PRECAUTIONS/RESTRICTIONS:   Fall  SUBJECTIVE:  Pt reports a fall over the weekend when she caught her foot when she was walking.  She has some some small abrasions on her knee and minimal pain with palpation and minimal swelling.  She has an abrasion on her great toe and will return to her podiatrist today for further evaluation.  Pain:  Are you having pain? Yes Pain location: R achilles tendon (insertional?) NPRS scale:  current 2/10  Max pain: 8/10 Aggravating factors: walking, standing, activity Relieving factors: resting, ice Pain description: intermittent, constant, sharp, and aching Stage: Chronic  OBJECTIVE:   GENERAL OBSERVATION/GAIT:  Antalgic gait with reduced time in stance R.  Low arch bil with navicular drop   SENSATION:          Light touch: Deficits bil feet   PALPATION: TTP calcaneal insertion of R achilles   LE MMT:   MMT Right 04/14/2022 Left 04/14/2022  Hip flexion (L2, L3) N (ratcheting) n  Knee extension (L3) n n  Knee flexion n n  Hip abduction      Hip extension      Hip external rotation      Hip internal rotation      Hip adduction      Ankle dorsiflexion (L4)      Ankle plantarflexion (S1) D D  Ankle inversion      Ankle eversion      Great Toe ext (L5)      Grossly        (Blank rows = not tested, score listed is out of 5 possible points.  N = WNL, D = diminished, C = clear for gross weakness with myotome testing, * = concordant pain with testing)   LE ROM:   ROM Right 04/14/2022 Left 04/14/2022  Hip flexion      Hip extension      Hip abduction 3+ 4  Hip adduction      Hip  internal rotation      Hip external rotation      Knee flexion      Knee extension      Ankle dorsiflexion 5 5  Ankle plantarflexion      Ankle inversion      Ankle eversion        (Blank rows = not tested, N = WNL, * = concordant pain with testing)   Functional Tests   Eval (04/14/2022)      Progressive balance screen (highest level completed for >/= 10''):   Tandem: R in rear 10'', L in rear 2''                                                                                                       PATIENT SURVEYS:  FOTO 52 -> 58     TODAY'S TREATMENT: /HM - taping and instructing pt on taping practices at home.  Ridged achilles taping   Creating, reviewing, and completing below HEP     HOME EXERCISE PROGRAM:   Access Code: [facility] URL: [email or web address] Date: 04/14/2022 Prepared by: Shearon Balo   Exercises - Seated Calf Stretch with Strap  - 3 x daily - 7 x weekly - 1 sets - 3 reps - 1 minute hold - Seated Ankle Plantarflexion with Resistance  - 1 x daily - 7 x weekly - 3 sets - 10 reps  ASTERISK SIGNS   Asterisk Signs Eval (04/14/2022) 5/30  6/6   6/13      Balance 3'' R in rear 30'' R semi tandem  45'' semi tandem (75%) on foam   45'' semi tandem (100%) on foam       heel raises       3x15 on  step to neutral                                                      TREATMENT 6/13:  Therapeutic Exercise: - Bike L3 71mwhile taking subjective and planning session with patient - knee ext machine - 20# - 3x10 (NT) - knee flexion machine - 25# - 3x10 (NT) - slant board stretch - 45'' x3 - heel raise  on step - 3x10 - step up - 4'' - 2x10  - 2'' step down - 2x10  Neuromuscular re-ed: - SLS - 45'' bouts (difficulty) - semi tandem 100% on foam - wooden rocker board df/pf  Iontophoresis:  - 1 ml dexamethasone - 4-6 hour slow release patch - placed R ankle  TREATMENT 6/6:  Therapeutic Exercise: - Bike L3 552mhile  taking subjective and planning session with patient - knee ext machine - 20# - 3x10 - knee flexion machine - 25# - 3x10 - hip abduction machine -  25# - 3x10 - slant board stretch - 45'' x3 - heel raise - 3x10 - step up - 4'' - 2x10   Neuromuscular re-ed: - semi tandem 50% on foam - semi tandem 75% on foam - wooden rocker board df/pf  Iontophoresis:  - 1 ml dexamethasone - 4-6 hour slow release patch - placed R ankle  TREATMENT 5/30:  Therapeutic Exercise: - Bike L3 [id number] taking subjective and planning session with patient - knee ext machine - 15# - 3x10 - knee flexion machine - 25# - 3x10 - hip abduction machine -  17.5# - 3x10  Iontophoresis:  - 1 ml dexamethasone - 4-6 hour slow release patch - placed R ankle   ASSESSMENT:   CLINICAL IMPRESSION: Kaitlyn did well with therapy with no adverse events.  We were able to advance semi-tandem on foam to 100%.  We tried advancing to SLS on solid ground but this is still quite difficult.  Added in step down; this requires max cuing for form.   OBJECTIVE IMPAIRMENTS: Pain, ankle ROM, ankle strength, hip strength, balance, gait   ACTIVITY LIMITATIONS: walking, standing, stairs   PERSONAL FACTORS: See medical history and pertinent history     REHAB POTENTIAL: Good   CLINICAL DECISION MAKING: Stable/uncomplicated   EVALUATION COMPLEXITY: Low     GOALS:     SHORT TERM GOALS: Target date: 05/05/2022   PegKorriell be >75% HEP compliant to improve carryover between sessions and facilitate independent management of condition   Evaluation (04/14/2022): ongoing Goal status: MET 6/6     LONG TERM GOALS: Target date: 06/09/2022   PegSarelyll improve FOTO score to 58 as a proxy for functional improvement   Evaluation/Baseline (04/14/2022): 52 Goal status: INITIAL     2.  PegTanessall be able to walk for 30 min, not limited by pain    Evaluation/Baseline (04/14/2022): 5-10 min Goal status: INITIAL     3.  PegMarquashall be  able to perform 3x15 heel raises on step, not limited by pain   Evaluation/Baseline (04/14/2022): limited by pain and strength Goal status: INITIAL     4.  PegLadonnell be able to stand for >45'' in tandem stance stance, to show a significant improvement in balance in order to reduce fall risk    Evaluation/Baseline (04/14/2022): 3'', R in rear Goal status:  INITIAL       PLAN: PT FREQUENCY: 1-2x/week   PT DURATION: 8 weeks (Ending 06/09/2022)   PLANNED INTERVENTIONS: Therapeutic exercises, Aquatic therapy, Therapeutic activity, Neuro Muscular re-education, Gait training, Patient/Family education, Joint mobilization, Dry Needling, Electrical stimulation, Spinal mobilization and/or manipulation, Moist heat, Taping, Vasopneumatic device, Ionotophoresis 9m/ml Dexamethasone, and Manual therapy   PLAN FOR NEXT SESSION: progressive ankle strengthening, ionto, taping, hip and core strengthening    KKevan NyReinhartsen PT 05/08/2022, 10:42 AM

## 2022-05-10 ENCOUNTER — Encounter: Payer: Self-pay | Admitting: Physical Therapy

## 2022-05-10 ENCOUNTER — Ambulatory Visit
Admission: RE | Admit: 2022-05-10 | Discharge: 2022-05-10 | Disposition: A | Discharge status: Home / Self Care | Payer: Medicare Other | Source: Ambulatory Visit | Attending: Podiatry | Admitting: Podiatry

## 2022-05-10 ENCOUNTER — Ambulatory Visit: Payer: Medicare Other | Admitting: Physical Therapy

## 2022-05-10 DIAGNOSIS — M25571 Pain in right ankle and joints of right foot: Secondary | ICD-10-CM | POA: Diagnosis not present

## 2022-05-10 DIAGNOSIS — M9261 Juvenile osteochondrosis of tarsus, right ankle: Secondary | ICD-10-CM

## 2022-05-10 DIAGNOSIS — R600 Localized edema: Secondary | ICD-10-CM

## 2022-05-10 DIAGNOSIS — M21861 Other specified acquired deformities of right lower leg: Secondary | ICD-10-CM

## 2022-05-10 DIAGNOSIS — R262 Difficulty in walking, not elsewhere classified: Secondary | ICD-10-CM

## 2022-05-10 DIAGNOSIS — M7661 Achilles tendinitis, right leg: Secondary | ICD-10-CM

## 2022-05-10 DIAGNOSIS — S93491A Sprain of other ligament of right ankle, initial encounter: Secondary | ICD-10-CM | POA: Diagnosis not present

## 2022-05-10 NOTE — Therapy (Signed)
OUTPATIENT PHYSICAL THERAPY TREATMENT NOTE   Patient Name: [name] MRN: [id number] DOB:[birth year], 66 y.o., female Today's Date: 05/10/2022  PCP: Rita Ohara, MD REFERRING PROVIDER:  [name]   PT End of Session - 05/10/22 1216     Visit Number 6    Date for PT Re-Evaluation 06/09/22   1-2x/week   Authorization Type [employer] - FOTO    Progress Note Due on Visit 10    PT Start Time 1216    PT Stop Time 1257    PT Time Calculation (min) 41 min             Past Medical History:  Diagnosis Date   Anemia    previously followed by Dr. Jamse Arn for anemia and elevated platelets   Anxiety    C. difficile colitis 10/01/2012   treated by [facility]   Chronic fatigue syndrome    Closed wedge compression fracture of T8 vertebra ([place]) 06/2021   DDD (degenerative disc disease), lumbar 08/19/2014   and facet arthroplasty & left lumbar radiculopathy (Dr.Ramos)   Depression    Dyssynergia    dyssynergenic defecation, contributing to fecal incontinence.   Edema    Fibromyalgia    Gastroparesis    followed at [facility]   GERD (gastroesophageal reflux disease)    History of kidney stones    History of vertebral fracture 06/30/2021   Hyperlipidemia    Kidney stone    Lumbar radiculopathy    Migraine    Neuropathy    Obstructive sleep apnea    Does  wear  CPAP   Osteoporosis    Paresthesia    Dr. Everette Rank at [facility]   Pelvic floor dysfunction    pelvic floor dyssynergy   Plantar fasciitis 02/2011   R foot   Pneumonia    2012   PONV (postoperative nausea and vomiting)    pt states has gastroparesis has difficulty taking antibiotics and narcotics has severe nausea and vomiting    Restless leg syndrome    S/P endometrial ablation 08/09/2006   Novasure Ablation   S/P epidural steroid injection 09/20/2014   Dr.Ramos   Tremor    Dr. Everette Rank   Urinary frequency    Urinary incontinence    Past Surgical History:  Procedure Laterality Date   CHOLECYSTECTOMY   07/2004   ENDOMETRIAL ABLATION  08/09/2006   Dr. Valentina Shaggy Ablation   FACET JOINT INJECTION  04/17/2017   Left L4-5 and L5-S1   GASTRIC ROUX-EN-Y N/A 09/29/2019   Procedure: LAPAROSCOPIC ROUX-EN-Y GASTRIC BYPASS WITH UPPER ENDOSCOPY, ERAS Pathway;  Surgeon: Johnathan Hausen, MD;  Location: [facility];  Service: General;  Laterality: N/A;   [place]   R knee, Dr. Eddie Dibbles, torn cartilage   KYPHOPLASTY  11/06/2021   T8-T9-Nundkumar   RETINAL LASER PROCEDURE Right 08/28/2018   laser retinopexy   RIGHT/LEFT HEART CATH AND CORONARY ANGIOGRAPHY N/A 01/01/2018   Procedure: RIGHT/LEFT HEART CATH AND CORONARY ANGIOGRAPHY;  Surgeon: Sherren Mocha, MD;  Location: [place] [facility];  Service: Cardiovascular;  Laterality: N/A;   TONSILLECTOMY  1968   TONSILLECTOMY     TOTAL KNEE ARTHROPLASTY Right 09/05/2015   Procedure: RIGHT TOTAL KNEE ARTHROPLASTY;  Surgeon: Gaynelle Arabian, MD;  Location: [facility];  Service: Orthopedics;  Laterality: Right;   TOTAL KNEE ARTHROPLASTY Left 07/14/2018   Procedure: LEFT TOTAL KNEE ARTHROPLASTY;  Surgeon: Gaynelle Arabian, MD;  Location: [facility];  Service: Orthopedics;  Laterality: Left;  ULTRASOUND GUIDANCE FOR VASCULAR ACCESS  01/01/2018   Procedure: Ultrasound Guidance For Vascular Access;  Surgeon: Sherren Mocha, MD;  Location: [place] [facility];  Service: Cardiovascular;;   UMBILICAL HERNIA REPAIR N/A 09/29/2019   Procedure: HERNIA REPAIR UMBILICAL ADULT;  Surgeon: Johnathan Hausen, MD;  Location: [facility];  Service: General;  Laterality: N/A;   Patient Active Problem List   Diagnosis Date Noted   Osteopenia 08/04/2021   Acute right-sided weakness 01/22/2021   Peripheral polyneuropathy 01/22/2021   History of migraine headaches 01/22/2021   Difficulty with speech    Gastric bypass status for obesity 09/29/2019   Aftercare 08/12/2018   Pain in left knee 06/03/2018   Dyspnea 12/26/2017   Restrictive lung disease secondary to  obesity 12/26/2017   Atypical chest pain 12/06/2017   Sinus tachycardia 12/06/2017   Chest pain 12/06/2017   DJD (degenerative joint disease), cervical 12/24/2016   Primary osteoarthritis of both hips 12/24/2016   Primary osteoarthritis of both knees 12/24/2016   H/O total knee replacement, right 12/24/2016   Spondylosis of lumbar region without myelopathy or radiculopathy 12/24/2016   Lumbosacral spondylosis without myelopathy 12/24/2016   Acute gout 05/30/2016   Gout 05/30/2016   Myalgia 03/29/2016   Other long term (current) drug therapy 03/29/2016   Idiopathic peripheral neuropathy 03/29/2016   Cannot sleep 03/29/2016   Migraine without aura and responsive to treatment 03/29/2016   Multifocal myoclonus 03/29/2016   Restless leg 03/29/2016   Has a tremor 03/29/2016   History of aspiration pneumonitis 01/25/2016   History of acute bronchitis 01/25/2016   LPRD (laryngopharyngeal reflux disease) 01/25/2016   Imbalance 01/09/2016   Serotonin syndrome 12/22/2015   Essential hypertension 09/10/2015   OA (osteoarthritis) of knee 09/05/2015   Obesity 05/17/2015   OSA (obstructive sleep apnea) 03/16/2013   Iron deficiency 11/06/2012   Thrombocythemia 11/06/2012   Leukocytosis 11/05/2012   Impaired fasting glucose 07/23/2012   Bronchitis 04/03/2012   Kidney stone on left side 03/05/2012   Kidney cysts 03/04/2012   Loose stools 03/04/2012   Urinary frequency 12/04/2011   Restless leg syndrome 12/04/2011   Polypharmacy 12/04/2011   Bladder incontinence 12/04/2011   Fibromyalgia    Mixed hyperlipidemia    Edema    S/P endometrial ablation    Allergic rhinitis 09/18/2011   Depression, major, single episode, in partial remission ([place]) 04/21/2011   PRECORDIAL PAIN 02/17/2010   Anxiety state 01/30/2010   Migraine 01/30/2010   GERD without esophagitis 01/30/2010   Gastroparesis 01/30/2010    THERAPY DIAG:  Difficulty in walking, not elsewhere classified  Pain in right ankle  and joints of right foot  Localized edema  REFERRING DIAG: Referral diagnosis: Achilles tendinitis, right leg [M76.61], Gastrocnemius equinus of right lower extremity [M21.861]  PERTINENT HISTORY: Chronic fatigue, T8 compression fx (2022), Osteoporosis, plantar fasciitis, idiopathic neuropathy (bil feet), fibromyalgia, R great toe pain  PRECAUTIONS/RESTRICTIONS:   Fall  SUBJECTIVE:  Pt went to podiatrist yesterday and still plans on surgery for her big toe in August.  Pain:  Are you having pain? Yes Pain location: R achilles tendon (insertional?) NPRS scale:  current 2/10  Max pain: 8/10 Aggravating factors: walking, standing, activity Relieving factors: resting, ice Pain description: intermittent, constant, sharp, and aching Stage: Chronic  OBJECTIVE:   GENERAL OBSERVATION/GAIT:                     Antalgic gait with reduced time in stance R.  Low arch bil with navicular drop  SENSATION:          Light touch: Deficits bil feet   PALPATION: TTP calcaneal insertion of R achilles   LE MMT:   MMT Right 04/14/2022 Left 04/14/2022  Hip flexion (L2, L3) N (ratcheting) n  Knee extension (L3) n n  Knee flexion n n  Hip abduction      Hip extension      Hip external rotation      Hip internal rotation      Hip adduction      Ankle dorsiflexion (L4)      Ankle plantarflexion (S1) D D  Ankle inversion      Ankle eversion      Great Toe ext (L5)      Grossly        (Blank rows = not tested, score listed is out of 5 possible points.  N = WNL, D = diminished, C = clear for gross weakness with myotome testing, * = concordant pain with testing)   LE ROM:   ROM Right 04/14/2022 Left 04/14/2022  Hip flexion      Hip extension      Hip abduction 3+ 4  Hip adduction      Hip internal rotation      Hip external rotation      Knee flexion      Knee extension      Ankle dorsiflexion 5 5  Ankle plantarflexion      Ankle inversion      Ankle eversion        (Blank  rows = not tested, N = WNL, * = concordant pain with testing)   Functional Tests   Eval (04/14/2022)      Progressive balance screen (highest level completed for >/= 10''):   Tandem: R in rear 10'', L in rear 2''                                                                                                       PATIENT SURVEYS:  FOTO 52 -> 58     TODAY'S TREATMENT: [place]/HM - taping and instructing pt on taping practices at home.  Ridged achilles taping   Creating, reviewing, and completing below HEP     HOME EXERCISE PROGRAM:   Access Code: [facility] URL: [email or web address] Date: 04/14/2022 Prepared by: Shearon Balo   Exercises - Seated Calf Stretch with Strap  - 3 x daily - 7 x weekly - 1 sets - 3 reps - 1 minute hold - Seated Ankle Plantarflexion with Resistance  - 1 x daily - 7 x weekly - 3 sets - 10 reps  ASTERISK SIGNS   Asterisk Signs Eval (04/14/2022) 5/30  6/6   6/13      Balance 3'' R in rear 30'' R semi tandem  45'' semi tandem (75%) on foam   45'' semi tandem (100%) on foam       heel raises       3x15 on step to neutral  TREATMENT 6/15:  Therapeutic Exercise: - Bike L3 64mwhile taking subjective and planning session with patient - knee ext machine - 20# - 3x10 (NT) - knee flexion machine - 25# - 3x10 (NT) - slant board stretch - 45'' x3 - heel raise  on step - 3x10 - step up - 6'' - 2x10   -Lateral x10 - 4'' step down - 2x10  Neuromuscular re-ed: - SLS - 45'' bouts (difficulty) - semi tandem 100% on foam - tandem stance - 461' - wooden rocker board df/pf  Iontophoresis:  - 1 ml dexamethasone - 4-6 hour slow release patch - placed R ankle  TREATMENT 6/13:  Therapeutic Exercise: - Bike L3 563mhile taking subjective and planning session with patient - knee ext machine - 20# - 3x10 (NT) - knee flexion machine - 25# - 3x10 (NT) - slant board stretch - 45''  x3 - heel raise  on step - 3x10 - step up - 4'' - 2x10  - 2'' step down - 2x10  Neuromuscular re-ed: - SLS - 45'' bouts (difficulty) - semi tandem 100% on foam - wooden rocker board df/pf  Iontophoresis:  - 1 ml dexamethasone - 4-6 hour slow release patch - placed R ankle  TREATMENT 6/6:  Therapeutic Exercise: - Bike L3 40m8mile taking subjective and planning session with patient - knee ext machine - 20# - 3x10 - knee flexion machine - 25# - 3x10 - hip abduction machine -  25# - 3x10 - slant board stretch - 45'' x3 - heel raise - 3x10 - step up - 4'' - 2x10   Neuromuscular re-ed: - semi tandem 50% on foam - semi tandem 75% on foam - wooden rocker board df/pf  Iontophoresis:  - 1 ml dexamethasone - 4-6 hour slow release patch - placed R ankle  TREATMENT 5/30:  Therapeutic Exercise: - Bike L3 40m 70mle taking subjective and planning session with patient - knee ext machine - 15# - 3x10 - knee flexion machine - 25# - 3x10 - hip abduction machine -  17.5# - 3x10  Iontophoresis:  - 1 ml dexamethasone - 4-6 hour slow release patch - placed R ankle   ASSESSMENT:   CLINICAL IMPRESSION: PeggAnnalyseprogressing well with therapy.  Her baseline pain is down somewhat.  Her calf strength has improved significantly along with her balance.  Her walking tolerance is improving.  We will continue with progressive loading of the achilles as able.   OBJECTIVE IMPAIRMENTS: Pain, ankle ROM, ankle strength, hip strength, balance, gait   ACTIVITY LIMITATIONS: walking, standing, stairs   PERSONAL FACTORS: See medical history and pertinent history     REHAB POTENTIAL: Good   CLINICAL DECISION MAKING: Stable/uncomplicated   EVALUATION COMPLEXITY: Low     GOALS:     SHORT TERM GOALS: Target date: 05/05/2022   PeggMonayel be >75% HEP compliant to improve carryover between sessions and facilitate independent management of condition   Evaluation (04/14/2022): ongoing Goal  status: MET 6/6     LONG TERM GOALS: Target date: 06/09/2022   PeggMalayjahl improve FOTO score to 58 as a proxy for functional improvement   Evaluation/Baseline (04/14/2022): 52 6/15: 52 Goal status: INITIAL     2.  PeggAlizial be able to walk for 30 min, not limited by pain    Evaluation/Baseline (04/14/2022): 5-10 min 6/15: 15 min Goal status: progressing     3.  PeggOsmaral be able to perform 3x15 heel raises on step, not limited by  pain   Evaluation/Baseline (04/14/2022): limited by pain and strength 6/15: 3x10 with some pain Goal status: progressing     4.  Sherrine will be able to stand for >45'' in tandem stance stance, to show a significant improvement in balance in order to reduce fall risk    Evaluation/Baseline (04/14/2022): 3'', R in rear 6/15: 35'' Goal status: progressing       PLAN: PT FREQUENCY: 1-2x/week   PT DURATION: 8 weeks (Ending 06/09/2022)   PLANNED INTERVENTIONS: Therapeutic exercises, Aquatic therapy, Therapeutic activity, Neuro Muscular re-education, Gait training, Patient/Family education, Joint mobilization, Dry Needling, Electrical stimulation, Spinal mobilization and/or manipulation, Moist heat, Taping, Vasopneumatic device, Ionotophoresis 34m/ml Dexamethasone, and Manual therapy   PLAN FOR NEXT SESSION: progressive ankle strengthening, ionto, taping, hip and core strengthening    KKevan NyReinhartsen PT 05/10/2022, 1:01 PM

## 2022-05-11 ENCOUNTER — Encounter: Payer: Medicare Other | Admitting: Podiatry

## 2022-05-11 NOTE — Progress Notes (Signed)
  Subjective:  Patient ID: [name], female    DOB: 12-08-1955,  MRN: [id number]  Chief Complaint  Patient presents with   Foot Injury    Patient fell yesterday and hurt the toe shes suppose to have surgery on and skin peeled back.    66 y.o. female presents with the above complaint. History confirmed with patient.  She returns today for surgical planning visit for the right great toe arthritis from her gout as well as follow-up on her Achilles tendinitis.  The Achilles tendon is still been very painful for her and is not getting much better.  She not taking any of the anti-inflammatory because she discussed with her primary and that it may worsen her gastric reflux disease   Interval history: She returns for an urgent visit for evaluation of her right hallux she fell yesterday and peeled the skin on the toe of the right foot she has been applying antibiotic ointment.  She has her MRI scheduled  Objective:  Physical Exam: warm, good capillary refill, no trophic changes or ulcerative lesions, normal DP and PT pulses, normal sensory exam, and there is abrasion with epidermal lysis of the distal tip of the hallux no signs of infection no exposed subcutaneous tissue.  Severe arthrosis of the IPJ of the right hallux with deformity and edema she has pain and tenderness in the mid substance of the Achilles tendon as well as the insertion now.  Gastrocnemius equinus is noted  X-ray 12/20/2021 showed significant arthritis of the IPJ of the right hallux, Haglund deformity as well as minimal calcaneal spurring Assessment:   1. Abrasion of skin       Plan:  Patient was evaluated and treated and all questions answered.    Fortunately does not seem to have any recurrence of deep ulceration and has had an abrasion of the distal tip of the toe.  She will complete her MRI and I refilled her mupirocin ointment and she will continue using this until it heals.  I will see her back after the MRI in a  few weeks.  Surgery scheduled for August hopefully he is able to heal by then without event and will not compromise surgery.   No follow-ups on file.

## 2022-05-14 ENCOUNTER — Ambulatory Visit: Payer: Medicare Other | Admitting: Podiatry

## 2022-05-15 ENCOUNTER — Encounter: Payer: Self-pay | Admitting: Physical Therapy

## 2022-05-15 ENCOUNTER — Ambulatory Visit: Payer: Medicare Other | Admitting: Physical Therapy

## 2022-05-15 ENCOUNTER — Telehealth: Payer: Self-pay | Admitting: Podiatry

## 2022-05-15 DIAGNOSIS — M25571 Pain in right ankle and joints of right foot: Secondary | ICD-10-CM | POA: Diagnosis not present

## 2022-05-15 DIAGNOSIS — R262 Difficulty in walking, not elsewhere classified: Secondary | ICD-10-CM | POA: Diagnosis not present

## 2022-05-15 DIAGNOSIS — R600 Localized edema: Secondary | ICD-10-CM

## 2022-05-15 NOTE — Therapy (Signed)
OUTPATIENT PHYSICAL THERAPY TREATMENT NOTE   Patient Name: SHARYN BRILLIANT MRN: [id number] DOB:[birth year], 66 y.o., female Today's Date: 05/15/2022  PCP: Rita Ohara, MD REFERRING PROVIDER:  [name]   PT End of Session - 05/15/22 0914     Visit Number 7    Date for PT Re-Evaluation 06/09/22   1-2x/week   Authorization Type [employer] - FOTO    Progress Note Due on Visit 10    PT Start Time 0915    PT Stop Time 0957    PT Time Calculation (min) 42 min             Past Medical History:  Diagnosis Date   Anemia    previously followed by Dr. Jamse Arn for anemia and elevated platelets   Anxiety    C. difficile colitis 10/01/2012   treated by [facility]   Chronic fatigue syndrome    Closed wedge compression fracture of T8 vertebra ([place]) 06/2021   DDD (degenerative disc disease), lumbar 08/19/2014   and facet arthroplasty & left lumbar radiculopathy (Dr.Ramos)   Depression    Dyssynergia    dyssynergenic defecation, contributing to fecal incontinence.   Edema    Fibromyalgia    Gastroparesis    followed at [facility]   GERD (gastroesophageal reflux disease)    History of kidney stones    History of vertebral fracture 06/30/2021   Hyperlipidemia    Kidney stone    Lumbar radiculopathy    Migraine    Neuropathy    Obstructive sleep apnea    Does  wear  CPAP   Osteoporosis    Paresthesia    Dr. Everette Rank at [facility]   Pelvic floor dysfunction    pelvic floor dyssynergy   Plantar fasciitis 02/2011   R foot   Pneumonia    2012   PONV (postoperative nausea and vomiting)    pt states has gastroparesis has difficulty taking antibiotics and narcotics has severe nausea and vomiting    Restless leg syndrome    S/P endometrial ablation 08/09/2006   Novasure Ablation   S/P epidural steroid injection 09/20/2014   Dr.Ramos   Tremor    Dr. Everette Rank   Urinary frequency    Urinary incontinence    Past Surgical History:  Procedure Laterality Date   CHOLECYSTECTOMY   07/2004   ENDOMETRIAL ABLATION  08/09/2006   Dr. Valentina Shaggy Ablation   FACET JOINT INJECTION  04/17/2017   Left L4-5 and L5-S1   GASTRIC ROUX-EN-Y N/A 09/29/2019   Procedure: LAPAROSCOPIC ROUX-EN-Y GASTRIC BYPASS WITH UPPER ENDOSCOPY, ERAS Pathway;  Surgeon: Johnathan Hausen, MD;  Location: [facility];  Service: General;  Laterality: N/A;   [place]   R knee, Dr. Eddie Dibbles, torn cartilage   KYPHOPLASTY  11/06/2021   T8-T9-Nundkumar   RETINAL LASER PROCEDURE Right 08/28/2018   laser retinopexy   RIGHT/LEFT HEART CATH AND CORONARY ANGIOGRAPHY N/A 01/01/2018   Procedure: RIGHT/LEFT HEART CATH AND CORONARY ANGIOGRAPHY;  Surgeon: Sherren Mocha, MD;  Location: [place] [facility];  Service: Cardiovascular;  Laterality: N/A;   TONSILLECTOMY  1968   TONSILLECTOMY     TOTAL KNEE ARTHROPLASTY Right 09/05/2015   Procedure: RIGHT TOTAL KNEE ARTHROPLASTY;  Surgeon: Gaynelle Arabian, MD;  Location: [facility];  Service: Orthopedics;  Laterality: Right;   TOTAL KNEE ARTHROPLASTY Left 07/14/2018   Procedure: LEFT TOTAL KNEE ARTHROPLASTY;  Surgeon: Gaynelle Arabian, MD;  Location: [facility];  Service: Orthopedics;  Laterality: Left;  ULTRASOUND GUIDANCE FOR VASCULAR ACCESS  01/01/2018   Procedure: Ultrasound Guidance For Vascular Access;  Surgeon: Sherren Mocha, MD;  Location: [place] [facility];  Service: Cardiovascular;;   UMBILICAL HERNIA REPAIR N/A 09/29/2019   Procedure: HERNIA REPAIR UMBILICAL ADULT;  Surgeon: Johnathan Hausen, MD;  Location: [facility];  Service: General;  Laterality: N/A;   Patient Active Problem List   Diagnosis Date Noted   Osteopenia 08/04/2021   Acute right-sided weakness 01/22/2021   Peripheral polyneuropathy 01/22/2021   History of migraine headaches 01/22/2021   Difficulty with speech    Gastric bypass status for obesity 09/29/2019   Aftercare 08/12/2018   Pain in left knee 06/03/2018   Dyspnea 12/26/2017   Restrictive lung disease secondary to  obesity 12/26/2017   Atypical chest pain 12/06/2017   Sinus tachycardia 12/06/2017   Chest pain 12/06/2017   DJD (degenerative joint disease), cervical 12/24/2016   Primary osteoarthritis of both hips 12/24/2016   Primary osteoarthritis of both knees 12/24/2016   H/O total knee replacement, right 12/24/2016   Spondylosis of lumbar region without myelopathy or radiculopathy 12/24/2016   Lumbosacral spondylosis without myelopathy 12/24/2016   Acute gout 05/30/2016   Gout 05/30/2016   Myalgia 03/29/2016   Other long term (current) drug therapy 03/29/2016   Idiopathic peripheral neuropathy 03/29/2016   Cannot sleep 03/29/2016   Migraine without aura and responsive to treatment 03/29/2016   Multifocal myoclonus 03/29/2016   Restless leg 03/29/2016   Has a tremor 03/29/2016   History of aspiration pneumonitis 01/25/2016   History of acute bronchitis 01/25/2016   LPRD (laryngopharyngeal reflux disease) 01/25/2016   Imbalance 01/09/2016   Serotonin syndrome 12/22/2015   Essential hypertension 09/10/2015   OA (osteoarthritis) of knee 09/05/2015   Obesity 05/17/2015   OSA (obstructive sleep apnea) 03/16/2013   Iron deficiency 11/06/2012   Thrombocythemia 11/06/2012   Leukocytosis 11/05/2012   Impaired fasting glucose 07/23/2012   Bronchitis 04/03/2012   Kidney stone on left side 03/05/2012   Kidney cysts 03/04/2012   Loose stools 03/04/2012   Urinary frequency 12/04/2011   Restless leg syndrome 12/04/2011   Polypharmacy 12/04/2011   Bladder incontinence 12/04/2011   Fibromyalgia    Mixed hyperlipidemia    Edema    S/P endometrial ablation    Allergic rhinitis 09/18/2011   Depression, major, single episode, in partial remission ([place]) 04/21/2011   PRECORDIAL PAIN 02/17/2010   Anxiety state 01/30/2010   Migraine 01/30/2010   GERD without esophagitis 01/30/2010   Gastroparesis 01/30/2010    THERAPY DIAG:  Pain in right ankle and joints of right foot  Localized  edema  Difficulty in walking, not elsewhere classified  REFERRING DIAG: Referral diagnosis: Achilles tendinitis, right leg [M76.61], Gastrocnemius equinus of right lower extremity [M21.861]  PERTINENT HISTORY: Chronic fatigue, T8 compression fx (2022), Osteoporosis, plantar fasciitis, idiopathic neuropathy (bil feet), fibromyalgia, R great toe pain  PRECAUTIONS/RESTRICTIONS:   Fall  SUBJECTIVE:  Pt reports that she has had some improvement in her achilles but it does continue to hurt.  Pain:  Are you having pain? Yes Pain location: R achilles tendon (insertional?) NPRS scale:  current 2/10  Max pain: 8/10 Aggravating factors: walking, standing, activity Relieving factors: resting, ice Pain description: intermittent, constant, sharp, and aching Stage: Chronic  OBJECTIVE:   GENERAL OBSERVATION/GAIT:                     Antalgic gait with reduced time in stance R.  Low arch bil with navicular drop  SENSATION:          Light touch: Deficits bil feet   PALPATION: TTP calcaneal insertion of R achilles   LE MMT:   MMT Right 04/14/2022 Left 04/14/2022  Hip flexion (L2, L3) N (ratcheting) n  Knee extension (L3) n n  Knee flexion n n  Hip abduction      Hip extension      Hip external rotation      Hip internal rotation      Hip adduction      Ankle dorsiflexion (L4)      Ankle plantarflexion (S1) D D  Ankle inversion      Ankle eversion      Great Toe ext (L5)      Grossly        (Blank rows = not tested, score listed is out of 5 possible points.  N = WNL, D = diminished, C = clear for gross weakness with myotome testing, * = concordant pain with testing)   LE ROM:   ROM Right 04/14/2022 Left 04/14/2022  Hip flexion      Hip extension      Hip abduction 3+ 4  Hip adduction      Hip internal rotation      Hip external rotation      Knee flexion      Knee extension      Ankle dorsiflexion 5 5  Ankle plantarflexion      Ankle inversion      Ankle eversion         (Blank rows = not tested, N = WNL, * = concordant pain with testing)   Functional Tests   Eval (04/14/2022)      Progressive balance screen (highest level completed for >/= 10''):   Tandem: R in rear 10'', L in rear 2''                                                                                                       PATIENT SURVEYS:  FOTO 52 -> 58     TODAY'S TREATMENT: [place]/HM - taping and instructing pt on taping practices at home.  Ridged achilles taping   Creating, reviewing, and completing below HEP     HOME EXERCISE PROGRAM:   Access Code: [facility] URL: [email or web address] Date: 04/14/2022 Prepared by: Shearon Balo   Exercises - Seated Calf Stretch with Strap  - 3 x daily - 7 x weekly - 1 sets - 3 reps - 1 minute hold - Seated Ankle Plantarflexion with Resistance  - 1 x daily - 7 x weekly - 3 sets - 10 reps  ASTERISK SIGNS   Asterisk Signs Eval (04/14/2022) 5/30  6/6   6/13 6/20     Balance 3'' R in rear 30'' R semi tandem  45'' semi tandem (75%) on foam   45'' semi tandem (100%) on foam  45'' Tandem     heel raises       3x15 on step to neutral  TREATMENT 6/20:  Therapeutic Exercise: - Bike L3 56mwhile taking subjective and planning session with patient - knee ext machine - 20# - 3x10 (NT) - knee flexion machine - 25# - 3x10 (NT) - slant board stretch - 45'' x3 - heel raise  on step - 3x15 - FHL S/L heel raise - 3x10 - tib anterior toe raise - 3x10 - lateral walking with band - GTB at ankles - 3 laps - step up - 6'' - 2x10 (NT)  -Lateral x10 - 4'' step down - 3x10 - KT tape   Neuromuscular re-ed: - SLS - 45'' bouts (difficulty) - tandem stance - 45''   TREATMENT 6/15:  Therapeutic Exercise: - Bike L3 542mhile taking subjective and planning session with patient - knee ext machine - 20# - 3x10 (NT) - knee flexion machine - 25# - 3x10 (NT) - slant  board stretch - 45'' x3 - heel raise  on step - 3x10 - step up - 6'' - 2x10   -Lateral x10 - 4'' step down - 2x10  Neuromuscular re-ed: - SLS - 45'' bouts (difficulty) - semi tandem 100% on foam - tandem stance - 45'' - wooden rocker board df/pf  Iontophoresis:  - 1 ml dexamethasone - 4-6 hour slow release patch - placed R ankle   ASSESSMENT:   CLINICAL IMPRESSION: PeAllyses progressing slowly toward goals with therapy.  She met he balance goal today and is getting closer to eccentric heel raise goal.  She is improving her overall volume consistently.  Continue focusing on progressive achilles loading with integration of balance and general strengthening as tolerated.   OBJECTIVE IMPAIRMENTS: Pain, ankle ROM, ankle strength, hip strength, balance, gait   ACTIVITY LIMITATIONS: walking, standing, stairs   PERSONAL FACTORS: See medical history and pertinent history     REHAB POTENTIAL: Good   CLINICAL DECISION MAKING: Stable/uncomplicated   EVALUATION COMPLEXITY: Low     GOALS:     SHORT TERM GOALS: Target date: 05/05/2022   PeYailenill be >75% HEP compliant to improve carryover between sessions and facilitate independent management of condition   Evaluation (04/14/2022): ongoing Goal status: MET 6/6     LONG TERM GOALS: Target date: 06/09/2022   PeBrunaill improve FOTO score to 58 as a proxy for functional improvement   Evaluation/Baseline (04/14/2022): 52 6/15: 52 Goal status: INITIAL     2.  PeRenessaill be able to walk for 30 min, not limited by pain    Evaluation/Baseline (04/14/2022): 5-10 min 6/15: 15 min Goal status: progressing     3.  PeMiyaniill be able to perform 3x15 heel raises on step, not limited by pain   Evaluation/Baseline (04/14/2022): limited by pain and strength 6/15: 3x10 with some pain Goal status: progressing     4.  PeCollenill be able to stand for >45'' in tandem stance stance, to show a significant improvement in balance in order to  reduce fall risk    Evaluation/Baseline (04/14/2022): 3'', R in rear 6/15: 35'' 6/20: 4546 MET Goal status: progressing       PLAN: PT FREQUENCY: 1-2x/week   PT DURATION: 8 weeks (Ending 06/09/2022)   PLANNED INTERVENTIONS: Therapeutic exercises, Aquatic therapy, Therapeutic activity, Neuro Muscular re-education, Gait training, Patient/Family education, Joint mobilization, Dry Needling, Electrical stimulation, Spinal mobilization and/or manipulation, Moist heat, Taping, Vasopneumatic device, Ionotophoresis 62m51ml Dexamethasone, and Manual therapy   PLAN FOR NEXT SESSION: progressive ankle strengthening, ionto, taping, hip and core strengthening    KarMathis Dad  PT 05/15/2022, 9:58 AM

## 2022-05-15 NOTE — Telephone Encounter (Signed)
Patient would like someone to call her back with her MRI results, does not want to wait until 06/11/22

## 2022-05-16 ENCOUNTER — Telehealth: Payer: Self-pay | Admitting: Rheumatology

## 2022-05-16 ENCOUNTER — Other Ambulatory Visit: Payer: Self-pay | Admitting: Rheumatology

## 2022-05-16 ENCOUNTER — Other Ambulatory Visit (HOSPITAL_COMMUNITY): Payer: Self-pay

## 2022-05-16 MED ORDER — ARMODAFINIL 250 MG PO TABS
ORAL_TABLET | ORAL | 0 refills | Status: DC
Start: 2022-05-16 — End: 2022-06-12
  Filled 2022-05-16: qty 30, 30d supply, fill #0

## 2022-05-16 NOTE — Telephone Encounter (Signed)
I called the patient's cell phone, did not answer I left her a voicemail and let her know I am also sending her a MyChart message with the results and we will discuss further at her next visit

## 2022-05-16 NOTE — Telephone Encounter (Signed)
Pt called again checking on mri results as she has not heard anything back from yesterday. Pt would like a call back today if possible.

## 2022-05-16 NOTE — Telephone Encounter (Signed)
Next Visit: 06/06/2022   Last Visit: 12/07/2021   Last Fill: 04/12/2022   Dx: management of chronic fatigue   Current Dose per office note on 12/07/2021: armodafinil 250 mg daily with breakfast.   Okay to refill Armodafinil?

## 2022-05-16 NOTE — Telephone Encounter (Signed)
Patient advised we have received the refill request and it is in the doctors box to be signed and sent to the pharmacy. Patient advised per our office protocol we do not send refills on this medication, we only send a 30 day supply at a time. Patient expressed understanding.

## 2022-05-16 NOTE — Telephone Encounter (Signed)
Patient called requesting prescription refill of Armodafinil to be sent to [facility].  Patient is also asking for refills for this medication so she doesn't have to call the office every month.  Patient states she took her last pill today, 05/16/22.

## 2022-05-17 ENCOUNTER — Encounter: Payer: Self-pay | Admitting: Physical Therapy

## 2022-05-17 ENCOUNTER — Ambulatory Visit: Payer: Medicare Other | Admitting: Physical Therapy

## 2022-05-17 DIAGNOSIS — R262 Difficulty in walking, not elsewhere classified: Secondary | ICD-10-CM

## 2022-05-17 DIAGNOSIS — R600 Localized edema: Secondary | ICD-10-CM

## 2022-05-17 DIAGNOSIS — M25571 Pain in right ankle and joints of right foot: Secondary | ICD-10-CM

## 2022-05-17 NOTE — Therapy (Signed)
OUTPATIENT PHYSICAL THERAPY TREATMENT NOTE   Patient Name: [name] MRN: [id number] DOB:[birth year], 66 y.o., female Today's Date: 05/17/2022  PCP: Rita Ohara, MD REFERRING PROVIDER:  [name]   PT End of Session - 05/17/22 0914     Visit Number 8    Date for PT Re-Evaluation 06/09/22   1-2x/week   Authorization Type [employer] - FOTO    Progress Note Due on Visit 10    PT Start Time 0915    PT Stop Time 0958    PT Time Calculation (min) 43 min             Past Medical History:  Diagnosis Date   Anemia    previously followed by Dr. Jamse Arn for anemia and elevated platelets   Anxiety    C. difficile colitis 10/01/2012   treated by [facility]   Chronic fatigue syndrome    Closed wedge compression fracture of T8 vertebra ([place]) 06/2021   DDD (degenerative disc disease), lumbar 08/19/2014   and facet arthroplasty & left lumbar radiculopathy (Dr.Ramos)   Depression    Dyssynergia    dyssynergenic defecation, contributing to fecal incontinence.   Edema    Fibromyalgia    Gastroparesis    followed at [facility]   GERD (gastroesophageal reflux disease)    History of kidney stones    History of vertebral fracture 06/30/2021   Hyperlipidemia    Kidney stone    Lumbar radiculopathy    Migraine    Neuropathy    Obstructive sleep apnea    Does  wear  CPAP   Osteoporosis    Paresthesia    Dr. Everette Rank at [facility]   Pelvic floor dysfunction    pelvic floor dyssynergy   Plantar fasciitis 02/2011   R foot   Pneumonia    2012   PONV (postoperative nausea and vomiting)    pt states has gastroparesis has difficulty taking antibiotics and narcotics has severe nausea and vomiting    Restless leg syndrome    S/P endometrial ablation 08/09/2006   Novasure Ablation   S/P epidural steroid injection 09/20/2014   Dr.Ramos   Tremor    Dr. Everette Rank   Urinary frequency    Urinary incontinence    Past Surgical History:  Procedure Laterality Date   CHOLECYSTECTOMY   07/2004   ENDOMETRIAL ABLATION  08/09/2006   Dr. Valentina Shaggy Ablation   FACET JOINT INJECTION  04/17/2017   Left L4-5 and L5-S1   GASTRIC ROUX-EN-Y N/A 09/29/2019   Procedure: LAPAROSCOPIC ROUX-EN-Y GASTRIC BYPASS WITH UPPER ENDOSCOPY, ERAS Pathway;  Surgeon: Johnathan Hausen, MD;  Location: [facility];  Service: General;  Laterality: N/A;   [place]   R knee, Dr. Eddie Dibbles, torn cartilage   KYPHOPLASTY  11/06/2021   T8-T9-Nundkumar   RETINAL LASER PROCEDURE Right 08/28/2018   laser retinopexy   RIGHT/LEFT HEART CATH AND CORONARY ANGIOGRAPHY N/A 01/01/2018   Procedure: RIGHT/LEFT HEART CATH AND CORONARY ANGIOGRAPHY;  Surgeon: Sherren Mocha, MD;  Location: [place] [facility];  Service: Cardiovascular;  Laterality: N/A;   TONSILLECTOMY  1968   TONSILLECTOMY     TOTAL KNEE ARTHROPLASTY Right 09/05/2015   Procedure: RIGHT TOTAL KNEE ARTHROPLASTY;  Surgeon: Gaynelle Arabian, MD;  Location: [facility];  Service: Orthopedics;  Laterality: Right;   TOTAL KNEE ARTHROPLASTY Left 07/14/2018   Procedure: LEFT TOTAL KNEE ARTHROPLASTY;  Surgeon: Gaynelle Arabian, MD;  Location: [facility];  Service: Orthopedics;  Laterality: Left;  ULTRASOUND GUIDANCE FOR VASCULAR ACCESS  01/01/2018   Procedure: Ultrasound Guidance For Vascular Access;  Surgeon: Sherren Mocha, MD;  Location: [place] [facility];  Service: Cardiovascular;;   UMBILICAL HERNIA REPAIR N/A 09/29/2019   Procedure: HERNIA REPAIR UMBILICAL ADULT;  Surgeon: Johnathan Hausen, MD;  Location: [facility];  Service: General;  Laterality: N/A;   Patient Active Problem List   Diagnosis Date Noted   Osteopenia 08/04/2021   Acute right-sided weakness 01/22/2021   Peripheral polyneuropathy 01/22/2021   History of migraine headaches 01/22/2021   Difficulty with speech    Gastric bypass status for obesity 09/29/2019   Aftercare 08/12/2018   Pain in left knee 06/03/2018   Dyspnea 12/26/2017   Restrictive lung disease secondary to  obesity 12/26/2017   Atypical chest pain 12/06/2017   Sinus tachycardia 12/06/2017   Chest pain 12/06/2017   DJD (degenerative joint disease), cervical 12/24/2016   Primary osteoarthritis of both hips 12/24/2016   Primary osteoarthritis of both knees 12/24/2016   H/O total knee replacement, right 12/24/2016   Spondylosis of lumbar region without myelopathy or radiculopathy 12/24/2016   Lumbosacral spondylosis without myelopathy 12/24/2016   Acute gout 05/30/2016   Gout 05/30/2016   Myalgia 03/29/2016   Other long term (current) drug therapy 03/29/2016   Idiopathic peripheral neuropathy 03/29/2016   Cannot sleep 03/29/2016   Migraine without aura and responsive to treatment 03/29/2016   Multifocal myoclonus 03/29/2016   Restless leg 03/29/2016   Has a tremor 03/29/2016   History of aspiration pneumonitis 01/25/2016   History of acute bronchitis 01/25/2016   LPRD (laryngopharyngeal reflux disease) 01/25/2016   Imbalance 01/09/2016   Serotonin syndrome 12/22/2015   Essential hypertension 09/10/2015   OA (osteoarthritis) of knee 09/05/2015   Obesity 05/17/2015   OSA (obstructive sleep apnea) 03/16/2013   Iron deficiency 11/06/2012   Thrombocythemia 11/06/2012   Leukocytosis 11/05/2012   Impaired fasting glucose 07/23/2012   Bronchitis 04/03/2012   Kidney stone on left side 03/05/2012   Kidney cysts 03/04/2012   Loose stools 03/04/2012   Urinary frequency 12/04/2011   Restless leg syndrome 12/04/2011   Polypharmacy 12/04/2011   Bladder incontinence 12/04/2011   Fibromyalgia    Mixed hyperlipidemia    Edema    S/P endometrial ablation    Allergic rhinitis 09/18/2011   Depression, major, single episode, in partial remission ([facility]) 04/21/2011   PRECORDIAL PAIN 02/17/2010   Anxiety state 01/30/2010   Migraine 01/30/2010   GERD without esophagitis 01/30/2010   Gastroparesis 01/30/2010    THERAPY DIAG:  Pain in right ankle and joints of right foot  Localized  edema  Difficulty in walking, not elsewhere classified  REFERRING DIAG: Referral diagnosis: Achilles tendinitis, right leg [M76.61], Gastrocnemius equinus of right lower extremity [M21.861]  PERTINENT HISTORY: Chronic fatigue, T8 compression fx (2022), Osteoporosis, plantar fasciitis, idiopathic neuropathy (bil feet), fibromyalgia, R great toe pain  PRECAUTIONS/RESTRICTIONS:   Fall  SUBJECTIVE:  Pt reports that she had significant pain after last visit in her heal.  She is not sure exactly why.  Pain:  Are you having pain? Yes Pain location: R achilles tendon (insertional?) NPRS scale:  current 2/10  Max pain: 8/10 Aggravating factors: walking, standing, activity Relieving factors: resting, ice Pain description: intermittent, constant, sharp, and aching Stage: Chronic  OBJECTIVE:   GENERAL OBSERVATION/GAIT:                     Antalgic gait with reduced time in stance R.  Low arch bil with  navicular drop   SENSATION:          Light touch: Deficits bil feet   PALPATION: TTP calcaneal insertion of R achilles   LE MMT:   MMT Right 04/14/2022 Left 04/14/2022  Hip flexion (L2, L3) N (ratcheting) n  Knee extension (L3) n n  Knee flexion n n  Hip abduction      Hip extension      Hip external rotation      Hip internal rotation      Hip adduction      Ankle dorsiflexion (L4)      Ankle plantarflexion (S1) D D  Ankle inversion      Ankle eversion      Great Toe ext (L5)      Grossly        (Blank rows = not tested, score listed is out of 5 possible points.  N = WNL, D = diminished, C = clear for gross weakness with myotome testing, * = concordant pain with testing)   LE ROM:   ROM Right 04/14/2022 Left 04/14/2022  Hip flexion      Hip extension      Hip abduction 3+ 4  Hip adduction      Hip internal rotation      Hip external rotation      Knee flexion      Knee extension      Ankle dorsiflexion 5 5  Ankle plantarflexion      Ankle inversion      Ankle  eversion        (Blank rows = not tested, N = WNL, * = concordant pain with testing)   Functional Tests   Eval (04/14/2022)      Progressive balance screen (highest level completed for >/= 10''):   Tandem: R in rear 10'', L in rear 2''                                                                                                       PATIENT SURVEYS:  FOTO 52 -> 58     TODAY'S TREATMENT: [place]/HM - taping and instructing pt on taping practices at home.  Ridged achilles taping   Creating, reviewing, and completing below HEP     HOME EXERCISE PROGRAM:   Access Code: [facility] URL: [email or web address] Date: 04/14/2022 Prepared by: Shearon Balo   Exercises - Seated Calf Stretch with Strap  - 3 x daily - 7 x weekly - 1 sets - 3 reps - 1 minute hold - Seated Ankle Plantarflexion with Resistance  - 1 x daily - 7 x weekly - 3 sets - 10 reps  ASTERISK SIGNS   Asterisk Signs Eval (04/14/2022) 5/30  6/6   6/13 6/20     Balance 3'' R in rear 30'' R semi tandem  45'' semi tandem (75%) on foam   45'' semi tandem (100%) on foam  45'' Tandem     heel raises       3x15 on step to neutral  TREATMENT 6/22:  Therapeutic Exercise: - Bike L3 48mwhile taking subjective and planning session with patient - knee ext machine - 20# - 3x10 - knee flexion machine - 25# - 3x10 (NT) - slant board stretch - 45'' x3 - heel raise  on step - 3x15 - tib anterior toe raise - 3x10 - lateral walking with band - GTB at ankles - 3 laps - step up - 6'' - 2x10 (NT)  -Lateral x10 - 4'' step down - 3x10  Neuromuscular re-ed: - SLS - 45'' bouts (difficulty) - semi tandem (100%) on foam - 45'' bouts - tandem stance - 45''  Iontophoresis:  - 1 ml dexamethasone - 4-6 hour slow release patch - placed R ankle  TREATMENT 6/20:  Therapeutic Exercise: - Bike L3 573mhile taking subjective and planning session with  patient - knee ext machine - 20# - 3x10 (NT) - knee flexion machine - 25# - 3x10 (NT) - slant board stretch - 45'' x3 - heel raise  on step - 3x15 - FHL S/L heel raise - 3x10 - tib anterior toe raise - 3x10 - lateral walking with band - GTB at ankles - 3 laps - step up - 6'' - 2x10 (NT)  -Lateral x10 - 4'' step down - 3x10 - KT tape   Neuromuscular re-ed: - SLS - 45'' bouts (difficulty) - tandem stance - 45''   TREATMENT 6/15:  Therapeutic Exercise: - Bike L3 8m48mile taking subjective and planning session with patient - knee ext machine - 20# - 3x10 (NT) - knee flexion machine - 25# - 3x10 (NT) - slant board stretch - 45'' x3 - heel raise  on step - 3x10 - step up - 6'' - 2x10   -Lateral x10 - 4'' step down - 2x10  Neuromuscular re-ed: - SLS - 45'' bouts (difficulty) - semi tandem 100% on foam - tandem stance - 45'' - wooden rocker board df/pf  Iontophoresis:  - 1 ml dexamethasone - 4-6 hour slow release patch - placed R ankle   ASSESSMENT:   CLINICAL IMPRESSION: PegDesi slowly progressing toward her goals.  She did have increased achilles pain following last visit so intensity was kept roughly the same.  We did eliminate the S/L heel raise today, which may have contributed to her pain.  We added back in the ionto patch to help reduce pain.   OBJECTIVE IMPAIRMENTS: Pain, ankle ROM, ankle strength, hip strength, balance, gait   ACTIVITY LIMITATIONS: walking, standing, stairs   PERSONAL FACTORS: See medical history and pertinent history     REHAB POTENTIAL: Good   CLINICAL DECISION MAKING: Stable/uncomplicated   EVALUATION COMPLEXITY: Low     GOALS:     SHORT TERM GOALS: Target date: 05/05/2022   PegEleanorll be >75% HEP compliant to improve carryover between sessions and facilitate independent management of condition   Evaluation (04/14/2022): ongoing Goal status: MET 6/6     LONG TERM GOALS: Target date: 06/09/2022   PegTamariall improve FOTO score  to 58 as a proxy for functional improvement   Evaluation/Baseline (04/14/2022): 52 6/15: 52 Goal status: INITIAL     2.  PegBaileill be able to walk for 30 min, not limited by pain    Evaluation/Baseline (04/14/2022): 5-10 min 6/15: 15 min Goal status: progressing     3.  PegVerneshall be able to perform 3x15 heel raises on step, not limited by pain   Evaluation/Baseline (04/14/2022): limited by pain and strength 6/15: 3x10 with  some pain 6/22: 3/15 with some pain Goal status: progressing     4.  Geetika will be able to stand for >45'' in tandem stance stance, to show a significant improvement in balance in order to reduce fall risk    Evaluation/Baseline (04/14/2022): 3'', R in rear 6/15: 35'' 6/20: 54'' MET Goal status: MET       PLAN: PT FREQUENCY: 1-2x/week   PT DURATION: 8 weeks (Ending 06/09/2022)   PLANNED INTERVENTIONS: Therapeutic exercises, Aquatic therapy, Therapeutic activity, Neuro Muscular re-education, Gait training, Patient/Family education, Joint mobilization, Dry Needling, Electrical stimulation, Spinal mobilization and/or manipulation, Moist heat, Taping, Vasopneumatic device, Ionotophoresis 60m/ml Dexamethasone, and Manual therapy   PLAN FOR NEXT SESSION: progressive ankle strengthening, ionto, taping, hip and core strengthening    KKevan NyReinhartsen PT 05/17/2022, 11:38 AM

## 2022-05-22 ENCOUNTER — Ambulatory Visit: Payer: Medicare Other

## 2022-05-22 ENCOUNTER — Telehealth: Payer: Self-pay | Admitting: *Deleted

## 2022-05-23 MED ORDER — TRAMADOL HCL 50 MG PO TABS: 50.0000 mg | ORAL_TABLET | Freq: Four times a day (QID) | ORAL | 0 refills | Status: AC | PRN

## 2022-05-24 ENCOUNTER — Encounter: Payer: Medicare Other | Admitting: Podiatry

## 2022-05-24 ENCOUNTER — Ambulatory Visit: Payer: Medicare Other

## 2022-05-24 DIAGNOSIS — M25571 Pain in right ankle and joints of right foot: Secondary | ICD-10-CM | POA: Diagnosis not present

## 2022-05-24 DIAGNOSIS — R262 Difficulty in walking, not elsewhere classified: Secondary | ICD-10-CM | POA: Diagnosis not present

## 2022-05-24 DIAGNOSIS — R600 Localized edema: Secondary | ICD-10-CM | POA: Diagnosis not present

## 2022-05-24 NOTE — Therapy (Signed)
OUTPATIENT PHYSICAL THERAPY TREATMENT NOTE   Patient Name: [name] MRN: [id number] DOB:March 03, 1956, 66 y.o., female Today's Date: 05/24/2022  PCP: Rita Ohara, MD REFERRING PROVIDER:  [name]   PT End of Session - 05/24/22 1440     Visit Number 9    Number of Visits 12    Date for PT Re-Evaluation 06/09/22    Authorization Type [employer] Medicare - FOTO    Progress Note Due on Visit 10    PT Start Time 1445    PT Stop Time 1533    PT Time Calculation (min) 48 min    Activity Tolerance Patient tolerated treatment well    Behavior During Therapy [facility] for tasks assessed/performed              Past Medical History:  Diagnosis Date   Anemia    previously followed by Dr. Jamse Arn for anemia and elevated platelets   Anxiety    C. difficile colitis 10/01/2012   treated by [facility]   Chronic fatigue syndrome    Closed wedge compression fracture of T8 vertebra ([place]) 06/2021   DDD (degenerative disc disease), lumbar 08/19/2014   and facet arthroplasty & left lumbar radiculopathy (Dr.Ramos)   Depression    Dyssynergia    dyssynergenic defecation, contributing to fecal incontinence.   Edema    Fibromyalgia    Gastroparesis    followed at [facility]   GERD (gastroesophageal reflux disease)    History of kidney stones    History of vertebral fracture 06/30/2021   Hyperlipidemia    Kidney stone    Lumbar radiculopathy    Migraine    Neuropathy    Obstructive sleep apnea    Does  wear  CPAP   Osteoporosis    Paresthesia    Dr. Everette Rank at [facility]   Pelvic floor dysfunction    pelvic floor dyssynergy   Plantar fasciitis 02/2011   R foot   Pneumonia    2012   PONV (postoperative nausea and vomiting)    pt states has gastroparesis has difficulty taking antibiotics and narcotics has severe nausea and vomiting    Restless leg syndrome    S/P endometrial ablation 08/09/2006   Novasure Ablation   S/P epidural steroid injection 09/20/2014   Dr.Ramos   Tremor     Dr. Everette Rank   Urinary frequency    Urinary incontinence    Past Surgical History:  Procedure Laterality Date   CHOLECYSTECTOMY  07/2004   ENDOMETRIAL ABLATION  08/09/2006   Dr. Valentina Shaggy Ablation   FACET JOINT INJECTION  04/17/2017   Left L4-5 and L5-S1   GASTRIC ROUX-EN-Y N/A 09/29/2019   Procedure: LAPAROSCOPIC ROUX-EN-Y GASTRIC BYPASS WITH UPPER ENDOSCOPY, ERAS Pathway;  Surgeon: Johnathan Hausen, MD;  Location: [facility];  Service: General;  Laterality: N/A;   [place]   R knee, Dr. Eddie Dibbles, torn cartilage   KYPHOPLASTY  11/06/2021   T8-T9-Nundkumar   RETINAL LASER PROCEDURE Right 08/28/2018   laser retinopexy   RIGHT/LEFT HEART CATH AND CORONARY ANGIOGRAPHY N/A 01/01/2018   Procedure: RIGHT/LEFT HEART CATH AND CORONARY ANGIOGRAPHY;  Surgeon: Sherren Mocha, MD;  Location: [place] [facility];  Service: Cardiovascular;  Laterality: N/A;   TONSILLECTOMY  1968   TONSILLECTOMY     TOTAL KNEE ARTHROPLASTY Right 09/05/2015   Procedure: RIGHT TOTAL KNEE ARTHROPLASTY;  Surgeon: Gaynelle Arabian, MD;  Location: [facility];  Service: Orthopedics;  Laterality: Right;   TOTAL KNEE ARTHROPLASTY  Left 07/14/2018   Procedure: LEFT TOTAL KNEE ARTHROPLASTY;  Surgeon: Gaynelle Arabian, MD;  Location: [facility];  Service: Orthopedics;  Laterality: Left;   ULTRASOUND GUIDANCE FOR VASCULAR ACCESS  01/01/2018   Procedure: Ultrasound Guidance For Vascular Access;  Surgeon: Sherren Mocha, MD;  Location: [place] [facility];  Service: Cardiovascular;;   UMBILICAL HERNIA REPAIR N/A 09/29/2019   Procedure: HERNIA REPAIR UMBILICAL ADULT;  Surgeon: Johnathan Hausen, MD;  Location: [facility];  Service: General;  Laterality: N/A;   Patient Active Problem List   Diagnosis Date Noted   Osteopenia 08/04/2021   Acute right-sided weakness 01/22/2021   Peripheral polyneuropathy 01/22/2021   History of migraine headaches 01/22/2021   Difficulty with speech    Gastric bypass status for obesity  09/29/2019   Aftercare 08/12/2018   Pain in left knee 06/03/2018   Dyspnea 12/26/2017   Restrictive lung disease secondary to obesity 12/26/2017   Atypical chest pain 12/06/2017   Sinus tachycardia 12/06/2017   Chest pain 12/06/2017   DJD (degenerative joint disease), cervical 12/24/2016   Primary osteoarthritis of both hips 12/24/2016   Primary osteoarthritis of both knees 12/24/2016   H/O total knee replacement, right 12/24/2016   Spondylosis of lumbar region without myelopathy or radiculopathy 12/24/2016   Lumbosacral spondylosis without myelopathy 12/24/2016   Acute gout 05/30/2016   Gout 05/30/2016   Myalgia 03/29/2016   Other long term (current) drug therapy 03/29/2016   Idiopathic peripheral neuropathy 03/29/2016   Cannot sleep 03/29/2016   Migraine without aura and responsive to treatment 03/29/2016   Multifocal myoclonus 03/29/2016   Restless leg 03/29/2016   Has a tremor 03/29/2016   History of aspiration pneumonitis 01/25/2016   History of acute bronchitis 01/25/2016   LPRD (laryngopharyngeal reflux disease) 01/25/2016   Imbalance 01/09/2016   Serotonin syndrome 12/22/2015   Essential hypertension 09/10/2015   OA (osteoarthritis) of knee 09/05/2015   Obesity 05/17/2015   OSA (obstructive sleep apnea) 03/16/2013   Iron deficiency 11/06/2012   Thrombocythemia 11/06/2012   Leukocytosis 11/05/2012   Impaired fasting glucose 07/23/2012   Bronchitis 04/03/2012   Kidney stone on left side 03/05/2012   Kidney cysts 03/04/2012   Loose stools 03/04/2012   Urinary frequency 12/04/2011   Restless leg syndrome 12/04/2011   Polypharmacy 12/04/2011   Bladder incontinence 12/04/2011   Fibromyalgia    Mixed hyperlipidemia    Edema    S/P endometrial ablation    Allergic rhinitis 09/18/2011   Depression, major, single episode, in partial remission ([place]) 04/21/2011   PRECORDIAL PAIN 02/17/2010   Anxiety state 01/30/2010   Migraine 01/30/2010   GERD without esophagitis  01/30/2010   Gastroparesis 01/30/2010    THERAPY DIAG:  Pain in right ankle and joints of right foot  Localized edema  Difficulty in walking, not elsewhere classified  REFERRING DIAG: Referral diagnosis: Achilles tendinitis, right leg [M76.61], Gastrocnemius equinus of right lower extremity [M21.861]  PERTINENT HISTORY: Chronic fatigue, T8 compression fx (2022), Osteoporosis, plantar fasciitis, idiopathic neuropathy (bil feet), fibromyalgia, R great toe pain  PRECAUTIONS/RESTRICTIONS:   Fall  SUBJECTIVE: Patient reports continued heel pain at that the ionto helped her last time.  Pain:  Are you having pain? Yes Pain location: R achilles tendon (insertional?) NPRS scale:  current 5/10  Max pain: 8/10 Aggravating factors: walking, standing, activity Relieving factors: resting, ice Pain description: intermittent, constant, sharp, and aching Stage: Chronic  OBJECTIVE:   GENERAL OBSERVATION/GAIT:  Antalgic gait with reduced time in stance R.  Low arch bil with navicular drop   SENSATION:          Light touch: Deficits bil feet   PALPATION: TTP calcaneal insertion of R achilles   LE MMT:   MMT Right 04/14/2022 Left 04/14/2022  Hip flexion (L2, L3) N (ratcheting) n  Knee extension (L3) n n  Knee flexion n n  Hip abduction      Hip extension      Hip external rotation      Hip internal rotation      Hip adduction      Ankle dorsiflexion (L4)      Ankle plantarflexion (S1) D D  Ankle inversion      Ankle eversion      Great Toe ext (L5)      Grossly        (Blank rows = not tested, score listed is out of 5 possible points.  N = WNL, D = diminished, C = clear for gross weakness with myotome testing, * = concordant pain with testing)   LE ROM:   ROM Right 04/14/2022 Left 04/14/2022  Hip flexion      Hip extension      Hip abduction 3+ 4  Hip adduction      Hip internal rotation      Hip external rotation      Knee flexion      Knee  extension      Ankle dorsiflexion 5 5  Ankle plantarflexion      Ankle inversion      Ankle eversion        (Blank rows = not tested, N = WNL, * = concordant pain with testing)   Functional Tests   Eval (04/14/2022)      Progressive balance screen (highest level completed for >/= 10''):   Tandem: R in rear 10'', L in rear 2''                                                                                                       PATIENT SURVEYS:  FOTO 52 -> 58     TODAY'S TREATMENT: Candelaria Arenas/HM - taping and instructing pt on taping practices at home.  Ridged achilles taping   Creating, reviewing, and completing below HEP     HOME EXERCISE PROGRAM:   Access Code: [facility] URL: [email or web address] Date: 04/14/2022 Prepared by: Shearon Balo   Exercises - Seated Calf Stretch with Strap  - 3 x daily - 7 x weekly - 1 sets - 3 reps - 1 minute hold - Seated Ankle Plantarflexion with Resistance  - 1 x daily - 7 x weekly - 3 sets - 10 reps  ASTERISK SIGNS   Asterisk Signs Eval (04/14/2022) 5/30  6/6   6/13 6/20     Balance 3'' R in rear 30'' R semi tandem  45'' semi tandem (75%) on foam   45'' semi tandem (100%) on foam  45'' Tandem     heel raises       3x15  on step to neutral                                                      TREATMENT 6/29: Therapeutic Exercise: - Bike L3 69mwhile taking subjective and planning session with patient - knee ext machine - 20# - 3x10 - knee flexion machine - 25# - 3x10 - slant board stretch - 45'' x3 - heel raise  on step - 3x15 - tib anterior toe raise - 3x10 - lateral walking with band - GTB at ankles - 3 laps (NT) - step up - 8'' - 2x10  -Lateral x10 - 4'' step down - 3x10 NMRE: - SLS - 45'' bouts (difficulty) Rt - semi tandem (100%) on foam - 45'' bouts - tandem stance - 45'' Iontophoresis:  - 1 ml dexamethasone - 4-6 hour slow release patch - placed R ankle  TREATMENT 6/22:  Therapeutic  Exercise: - Bike L3 524mhile taking subjective and planning session with patient - knee ext machine - 20# - 3x10 - knee flexion machine - 25# - 3x10 (NT) - slant board stretch - 45'' x3 - heel raise  on step - 3x15 - tib anterior toe raise - 3x10 - lateral walking with band - GTB at ankles - 3 laps - step up - 6'' - 2x10 (NT)  -Lateral x10 - 4'' step down - 3x10  Neuromuscular re-ed: - SLS - 45'' bouts (difficulty) - semi tandem (100%) on foam - 45'' bouts - tandem stance - 45''  Iontophoresis:  - 1 ml dexamethasone - 4-6 hour slow release patch - placed R ankle  TREATMENT 6/20:  Therapeutic Exercise: - Bike L3 43m30mile taking subjective and planning session with patient - knee ext machine - 20# - 3x10 (NT) - knee flexion machine - 25# - 3x10 (NT) - slant board stretch - 45'' x3 - heel raise  on step - 3x15 - FHL S/L heel raise - 3x10 - tib anterior toe raise - 3x10 - lateral walking with band - GTB at ankles - 3 laps - step up - 6'' - 2x10 (NT)  -Lateral x10 - 4'' step down - 3x10 - KT tape   Neuromuscular re-ed: - SLS - 45'' bouts (difficulty) - tandem stance - 45''    ASSESSMENT:   CLINICAL IMPRESSION: Patient presents to PT reporting continued pain in her R heel and states she has been compliant with her HEP. Session today continued to focus on distal LE strengthening and stretching as well as balance training. Patient was able to tolerate all prescribed exercises with no adverse effects. Patient continues to benefit from skilled PT services and should be progressed as able to improve functional independence.    OBJECTIVE IMPAIRMENTS: Pain, ankle ROM, ankle strength, hip strength, balance, gait   ACTIVITY LIMITATIONS: walking, standing, stairs   PERSONAL FACTORS: See medical history and pertinent history     REHAB POTENTIAL: Good   CLINICAL DECISION MAKING: Stable/uncomplicated   EVALUATION COMPLEXITY: Low     GOALS:     SHORT TERM GOALS: Target  date: 05/05/2022   PegReniyahll be >75% HEP compliant to improve carryover between sessions and facilitate independent management of condition   Evaluation (04/14/2022): ongoing Goal status: MET 6/6     LONG TERM GOALS: Target date: 06/09/2022   PegBernettell improve FOTO  score to 58 as a proxy for functional improvement   Evaluation/Baseline (04/14/2022): 52 6/15: 52 Goal status: INITIAL     2.  Shanai will be able to walk for 30 min, not limited by pain    Evaluation/Baseline (04/14/2022): 5-10 min 6/15: 15 min Goal status: progressing     3.  Aamina will be able to perform 3x15 heel raises on step, not limited by pain   Evaluation/Baseline (04/14/2022): limited by pain and strength 6/15: 3x10 with some pain 6/22: 3/15 with some pain Goal status: progressing     4.  Kaeleigh will be able to stand for >45'' in tandem stance stance, to show a significant improvement in balance in order to reduce fall risk    Evaluation/Baseline (04/14/2022): 3'', R in rear 6/15: 35'' 6/20: 33'' MET Goal status: MET       PLAN: PT FREQUENCY: 1-2x/week   PT DURATION: 8 weeks (Ending 06/09/2022)   PLANNED INTERVENTIONS: Therapeutic exercises, Aquatic therapy, Therapeutic activity, Neuro Muscular re-education, Gait training, Patient/Family education, Joint mobilization, Dry Needling, Electrical stimulation, Spinal mobilization and/or manipulation, Moist heat, Taping, Vasopneumatic device, Ionotophoresis 66m/ml Dexamethasone, and Manual therapy   PLAN FOR NEXT SESSION: progressive ankle strengthening, ionto, taping, hip and core strengthening    SEvelene CroonPTA 05/24/2022, 3:34 PM

## 2022-05-30 ENCOUNTER — Ambulatory Visit: Payer: Medicare Other

## 2022-05-30 NOTE — Progress Notes (Addendum)
Office Visit Note  Patient: [name]             Date of Birth: 1956-07-23           MRN: [id number]             PCP: Rita Ohara, MD Referring: Rita Ohara, MD Visit Date: 06/06/2022 Occupation: '@GUAROCC'$ @  Subjective:  Generalized pain  History of Present Illness: [name] is a 66 y.o. female with history of gout, osteoarthritis, degenerative disc disease and fibromyalgia.  She states that she has been having increased pain and discomfort in her right first toe and right Achilles tendon.  She has been under care of Dr. Sherryle Lis at [facility].  She states she will be undergoing surgery in August for both issues.  She continues to have some stiffness in her bilateral knee joints which are replaced.  she continues to have some neck and lower back pain.  She has generalized pain and discomfort from fibromyalgia.  She denies having gout flare.  She has been taking allopurinol 300 mg p.o. daily prescribed by Dr. Tomi Bamberger.    Activities of Daily Living:  Patient reports morning stiffness for 0 minutes.   Patient Reports nocturnal pain.  Difficulty dressing/grooming: Denies Difficulty climbing stairs: Reports Difficulty getting out of chair: Reports Difficulty using hands for taps, buttons, cutlery, and/or writing: Denies  Review of Systems  Constitutional:  Positive for fatigue.  HENT:  Positive for mouth dryness. Negative for mouth sores.   Eyes:  Positive for dryness.  Respiratory:  Negative for shortness of breath.   Cardiovascular:  Negative for chest pain and palpitations.  Gastrointestinal:  Negative for blood in stool, constipation and diarrhea.  Endocrine: Negative for increased urination.  Genitourinary:  Negative for involuntary urination.  Musculoskeletal:  Positive for myalgias, muscle tenderness and myalgias. Negative for joint pain, joint pain, joint swelling, muscle weakness and morning stiffness.  Skin:  Negative for color change, rash, hair loss and  sensitivity to sunlight.  Allergic/Immunologic: Negative for susceptible to infections.  Neurological:  Positive for headaches. Negative for dizziness.  Hematological:  Negative for swollen glands.  Psychiatric/Behavioral:  Positive for sleep disturbance. Negative for depressed mood. The patient is not nervous/anxious.     PMFS History:  Patient Active Problem List   Diagnosis Date Noted   Osteopenia 08/04/2021   Acute right-sided weakness 01/22/2021   Peripheral polyneuropathy 01/22/2021   History of migraine headaches 01/22/2021   Difficulty with speech    Gastric bypass status for obesity 09/29/2019   Aftercare 08/12/2018   Pain in left knee 06/03/2018   Dyspnea 12/26/2017   Restrictive lung disease secondary to obesity 12/26/2017   Atypical chest pain 12/06/2017   Sinus tachycardia 12/06/2017   Chest pain 12/06/2017   DJD (degenerative joint disease), cervical 12/24/2016   Primary osteoarthritis of both hips 12/24/2016   Primary osteoarthritis of both knees 12/24/2016   H/O total knee replacement, right 12/24/2016   Spondylosis of lumbar region without myelopathy or radiculopathy 12/24/2016   Lumbosacral spondylosis without myelopathy 12/24/2016   Acute gout 05/30/2016   Gout 05/30/2016   Myalgia 03/29/2016   Other long term (current) drug therapy 03/29/2016   Idiopathic peripheral neuropathy 03/29/2016   Cannot sleep 03/29/2016   Migraine without aura and responsive to treatment 03/29/2016   Multifocal myoclonus 03/29/2016   Restless leg 03/29/2016   Has a tremor 03/29/2016   History of aspiration pneumonitis 01/25/2016   History of acute bronchitis 01/25/2016  LPRD (laryngopharyngeal reflux disease) 01/25/2016   Imbalance 01/09/2016   Serotonin syndrome 12/22/2015   Essential hypertension 09/10/2015   OA (osteoarthritis) of knee 09/05/2015   Obesity 05/17/2015   OSA (obstructive sleep apnea) 03/16/2013   Iron deficiency 11/06/2012   Thrombocythemia 11/06/2012    Leukocytosis 11/05/2012   Impaired fasting glucose 07/23/2012   Bronchitis 04/03/2012   Kidney stone on left side 03/05/2012   Kidney cysts 03/04/2012   Loose stools 03/04/2012   Urinary frequency 12/04/2011   Restless leg syndrome 12/04/2011   Polypharmacy 12/04/2011   Bladder incontinence 12/04/2011   Fibromyalgia    Mixed hyperlipidemia    Edema    S/P endometrial ablation    Allergic rhinitis 09/18/2011   Depression, major, single episode, in partial remission ([place]) 04/21/2011   PRECORDIAL PAIN 02/17/2010   Anxiety state 01/30/2010   Migraine 01/30/2010   GERD without esophagitis 01/30/2010   Gastroparesis 01/30/2010    Past Medical History:  Diagnosis Date   Anemia    previously followed by Dr. Jamse Arn for anemia and elevated platelets   Anxiety    C. difficile colitis 10/01/2012   treated by [facility]   Chronic fatigue syndrome    Closed wedge compression fracture of T8 vertebra ([place]) 06/2021   DDD (degenerative disc disease), lumbar 08/19/2014   and facet arthroplasty & left lumbar radiculopathy (Dr.Ramos)   Depression    Dyssynergia    dyssynergenic defecation, contributing to fecal incontinence.   Edema    Fibromyalgia    Gastroparesis    followed at [facility]   GERD (gastroesophageal reflux disease)    History of kidney stones    History of vertebral fracture 06/30/2021   Hyperlipidemia    Kidney stone    Lumbar radiculopathy    Migraine    Neuropathy    Obstructive sleep apnea    Does  wear  CPAP   Osteoporosis    Paresthesia    Dr. Everette Rank at [facility]   Pelvic floor dysfunction    pelvic floor dyssynergy   Plantar fasciitis 02/2011   R foot   Pneumonia    2012   PONV (postoperative nausea and vomiting)    pt states has gastroparesis has difficulty taking antibiotics and narcotics has severe nausea and vomiting    Restless leg syndrome    S/P endometrial ablation 08/09/2006   Novasure Ablation   S/P epidural steroid injection 09/20/2014    Dr.Ramos   Tremor    Dr. Everette Rank   Urinary frequency    Urinary incontinence     Family History  Problem Relation Age of Onset   Allergies Mother    Hypertension Mother    Heart disease Mother        possible valve problem - leaking valve   Macular degeneration Mother    Heart disease Father        pacemaker, CHF   Hypertension Father    Diabetes Father        borderline   Stroke Father 69   Kidney disease Father    Asthma Sister    Irritable bowel syndrome Sister    Allergies Sister    Heart disease Paternal Grandmother    Heart disease Paternal Grandfather    Cancer Maternal Aunt        leukemia   Cancer Maternal Aunt    Colon cancer Maternal Aunt        late 60's   Heart disease Maternal Grandmother    Heart disease Maternal  Grandfather    CAD Neg Hx    Past Surgical History:  Procedure Laterality Date   CHOLECYSTECTOMY  07/2004   ENDOMETRIAL ABLATION  08/09/2006   Dr. Valentina Shaggy Ablation   FACET JOINT INJECTION  04/17/2017   Left L4-5 and L5-S1   GASTRIC ROUX-EN-Y N/A 09/29/2019   Procedure: LAPAROSCOPIC ROUX-EN-Y GASTRIC BYPASS WITH UPPER ENDOSCOPY, ERAS Pathway;  Surgeon: Johnathan Hausen, MD;  Location: [facility];  Service: General;  Laterality: N/A;   [place]   R knee, Dr. Eddie Dibbles, torn cartilage   KYPHOPLASTY  11/06/2021   T8-T9-Nundkumar   RETINAL LASER PROCEDURE Right 08/28/2018   laser retinopexy   RIGHT/LEFT HEART CATH AND CORONARY ANGIOGRAPHY N/A 01/01/2018   Procedure: RIGHT/LEFT HEART CATH AND CORONARY ANGIOGRAPHY;  Surgeon: Sherren Mocha, MD;  Location: [place] [facility];  Service: Cardiovascular;  Laterality: N/A;   TONSILLECTOMY  1968   TONSILLECTOMY     TOTAL KNEE ARTHROPLASTY Right 09/05/2015   Procedure: RIGHT TOTAL KNEE ARTHROPLASTY;  Surgeon: Gaynelle Arabian, MD;  Location: [facility];  Service: Orthopedics;  Laterality: Right;   TOTAL KNEE ARTHROPLASTY Left 07/14/2018   Procedure: LEFT TOTAL KNEE  ARTHROPLASTY;  Surgeon: Gaynelle Arabian, MD;  Location: [facility];  Service: Orthopedics;  Laterality: Left;   ULTRASOUND GUIDANCE FOR VASCULAR ACCESS  01/01/2018   Procedure: Ultrasound Guidance For Vascular Access;  Surgeon: Sherren Mocha, MD;  Location: [place] [facility];  Service: Cardiovascular;;   UMBILICAL HERNIA REPAIR N/A 09/29/2019   Procedure: HERNIA REPAIR UMBILICAL ADULT;  Surgeon: Johnathan Hausen, MD;  Location: [facility];  Service: General;  Laterality: N/A;   Social History   Social History Narrative   Married, 1 dog. 1 son in [place] (grandson born 06/2017), 1 stepson in [place], with 2 children   Immunization History  Administered Date(s) Administered   Influenza Inj Mdck Quad Pf 09/13/2019   Influenza Split 09/17/2011, 09/03/2012, 09/04/2013   Influenza,inj,Quad PF,6+ Mos 08/16/2014, 07/27/2015, 07/31/2017, 08/13/2018, 07/28/2020   Influenza-Unspecified 10/04/2016, 07/31/2017, 09/23/2017, 09/18/2021   PFIZER Comirnaty(Gray Top)Covid-19 Tri-Sucrose Vaccine 06/01/2021   PFIZER(Purple Top)SARS-COV-2 Vaccination 02/20/2020, 03/12/2020, 09/20/2020   Pfizer Covid-19 Vaccine Bivalent Booster 52yr & up 02/05/2022   Tdap 01/16/2011, 06/20/2017   Zoster Recombinat (Shingrix) 02/05/2019, 07/14/2019     Objective: Vital Signs: BP 116/75 (BP Location: Left Arm, Patient Position: Sitting, Cuff Size: Normal)   Pulse 67   Ht '5\' 2"'$  (1.575 m)   Wt 186 lb 9.6 oz (84.6 kg)   LMP 07/27/2006   BMI 34.13 kg/m    Physical Exam Vitals and nursing note reviewed.  Constitutional:      Appearance: She is well-developed.  HENT:     Head: Normocephalic and atraumatic.  Eyes:     Conjunctiva/sclera: Conjunctivae normal.  Cardiovascular:     Rate and Rhythm: Normal rate and regular rhythm.     Heart sounds: Normal heart sounds.  Pulmonary:     Effort: Pulmonary effort is normal.     Breath sounds: Normal breath sounds.  Abdominal:     General: Bowel sounds are normal.     Palpations:  Abdomen is soft.  Musculoskeletal:     Cervical back: Normal range of motion.  Lymphadenopathy:     Cervical: No cervical adenopathy.  Skin:    General: Skin is warm and dry.     Capillary Refill: Capillary refill takes less than 2 seconds.     Comments: Skin abrasion was noted over right great toe  Neurological:     Mental Status: She is alert and oriented to person, place, and time.  Psychiatric:        Behavior: Behavior normal.      Musculoskeletal Exam: C-spine was in good range of motion.  She had bilateral trapezius spasm.  Shoulder joints, elbow joints, wrist joints, MCPs PIPs and DIPs with good range of motion with no synovitis.  Hip joints and knee joints with good range of motion.  She had bilateral PIP and DIP thickening.  She had tenderness over right Achilles tendon.  CDAI Exam: CDAI Score: -- Patient Global: --; Provider Global: -- Swollen: --; Tender: -- Joint Exam 06/06/2022   No joint exam has been documented for this visit   There is currently no information documented on the homunculus. Go to the Rheumatology activity and complete the homunculus joint exam.  Investigation: No additional findings.  Imaging: MR HEEL RIGHT WO CONTRAST  Result Date: 05/12/2022 CLINICAL DATA:  Achilles tendinitis. EXAM: MR OF THE RIGHT HEEL WITHOUT CONTRAST TECHNIQUE: Multiplanar, multisequence MR imaging of the right ankle was performed. No intravenous contrast was administered. COMPARISON:  Right foot radiographs 11/30/2021 and 08/24/2021; MRI right forefoot 04/03/2021 FINDINGS: TENDONS Peroneal: The peroneus longus and brevis tendons are intact. Posteromedial: Mild posterior tibial and flexor digitorum longus tenosynovitis at the level of the talus and more proximally. Mild flexor hallucis longus tenosynovitis at the level of the tibiotalar joint. Anterior: The tibialis anterior, extensor hallucis longus, and extensor digitorum longus tendons are intact. Achilles: Mild to moderate  intermediate T2 signal and thickening tendinosis of the distal Achilles tendon. Mild-to-moderate fluid within the pre-Achilles bursa. No Achilles fluid bright tear or tendon retraction. Plantar Fascia: Intact.  Tiny plantar calcaneal heel spur. LIGAMENTS Lateral: There is mild intermediate T2 signal and attenuation of the anterior talofibular ligament (axial series 6 images 17 and 18) likely remote partial-thickness tear. The calcaneofibular posterior talofibular and anterior and posterior tibiofibular ligaments are intact. There is mild chronic cystic change within the fibula at the superior aspect of the posterior talofibular ligament insertion. Medial: The tibiotalar deep deltoid and tibial spring ligaments are intact. CARTILAGE Ankle Joint: There is partial-thickness cartilage loss of the far medial aspect of the talar dome with mild subchondral marrow edema in the region measuring up to approximately 3 mm in transverse dimension and 5 mm in AP dimension. There is also partial-thickness cartilage loss within the adjacent medial tibial plafond. Subtalar Joints/Sinus Tarsi: Fat is preserved within sinus tarsi. Bones: Moderate cartilage thinning and degenerative osteophytosis at the dorsal aspect of the talonavicular joint. Mild-to-moderate navicular-cuneiform osteoarthritis. Moderate to high-grade tarsometatarsal osteoarthritis, greatest within the third tarsometatarsal joint where there is high-grade cartilage thinning and subchondral degenerative cystic change. Moderate degenerative changes of the calcaneocuboid articulation. Other: The tarsal tunnel is unremarkable. The Lisfranc ligament complex is intact. IMPRESSION: 1. Mild-to-moderate distal Achilles insertional tendinosis. Mild-to-moderate fluid within the pre Achilles bursa. 2. Remote partial-thickness tear of the anterior talofibular ligament. 3. Partial-thickness cartilage loss within the medial aspect of the tibial plafond and talar dome with mild talar  dome subchondral cystic change. 4. Moderate midfoot osteoarthritis, greatest within the third, second, and fourth tarsometatarsal joints. Electronically Signed   By: Yvonne Kendall M.D.   On: 05/12/2022 15:35    Recent Labs: Lab Results  Component Value Date   WBC 8.8 11/30/2021   HGB 12.4 11/30/2021   PLT 345 11/30/2021   NA 141 02/05/2022   K 4.3 02/05/2022   CL 96 02/05/2022  CO2 24 02/05/2022   GLUCOSE 101 (H) 02/05/2022   BUN 17 02/05/2022   CREATININE 0.72 02/05/2022   BILITOT <0.2 02/05/2022   ALKPHOS 268 (H) 03/12/2022   ALKPHOS 271 (H) 03/12/2022   AST 31 02/05/2022   ALT 33 (H) 02/05/2022   PROT 6.5 02/05/2022   ALBUMIN 4.0 02/05/2022   CALCIUM 9.6 02/05/2022   GFRAA 81 11/07/2020    Speciality Comments: Forteo started 08/07/21 (first dose in clinic)  Procedures:  Trigger Point Inj  Date/Time: 06/06/2022 11:38 AM  Performed by: Bo Merino, MD Authorized by: Bo Merino, MD   Consent Given by:  Patient Site marked: the procedure site was marked   Timeout: prior to procedure the correct patient, procedure, and site was verified   Indications:  Muscle spasm and pain Total # of Trigger Points:  2 Location: neck   Needle Size:  27 G Approach:  Dorsal Medications #1:  0.5 mL lidocaine 1 %; 10 mg triamcinolone acetonide 40 MG/ML Medications #2:  10 mg triamcinolone acetonide 40 MG/ML; 0.5 mL lidocaine 1 % Patient tolerance:  Patient tolerated the procedure well with no immediate complications  Allergies: Erythromycin, Meperidine hcl, Polyethyl glyc-propyl glyc pf, and Dilaudid [hydromorphone hcl]   Assessment / Plan:     Visit Diagnoses: Idiopathic chronic gout of multiple sites without tophus - allopurinol 300 mg 1 tablet by mouth daily. Her uric acid was within the desirable range 4.5 on 11/30/2021.  Patient denies having any gout flare.  Primary osteoarthritis of both hands-she has bilateral PIP and DIP thickening.  She had no tenderness on my  examination.  She continues to have some stiffness.  Primary osteoarthritis of both hips-she complains of some discomfort in her bilateral hips.  History of total knee replacement, bilateral-she has pain and discomfort in her bilateral knee joints which are replaced.  Abrasion of skin - Right great toe  Primary osteoarthritis of both feet-patient has severe arthritis in her feet especially her right foot.  She will be undergoing surgery for her right great toe.  Achilles tendinitis, right leg-she states that she will have surgery on the right Achilles tendon by Dr. Sherryle Lis.  DDD (degenerative disc disease), cervical-she has chronic pain and stiffness.  DDD (degenerative disc disease), lumbar - She has been following up with Dr. Maxie Better at [facility].  Fibromyalgia-she continues to have generalized pain and discomfort.  She has positive tender points.  Other fatigue -she has been on armodafinil 250 mg daily with breakfast for many years.  I had a detailed discussion with patient regarding tapering off armodafinil as some she is not working now.  Tapering off the medication over the next 2 months was advised.  We will refill it for the next 2 months.  We will stop providing refills on armodafinil in September 2023.  Patient voiced understanding.  Trapezius muscle spasm - methocarbamol 500 mg 1 tablet by mouth daily as needed for muscle spasms.  Has been experiencing increased pain and discomfort in bilateral trapezius region.  She requested trigger point injections.  After informed consent was obtained and side effects were discussed bilateral trapezius area were injected with lidocaine and Kenalog as described above.  She tolerated the procedure well.  Postprocedure instructions were given.  Age-related osteoporosis with current pathological fracture, initial encounter - DEXA 07/12/2020 reviewed today in the office: Right femoral neck: BMD 0.632 with T score -2.0.  H/o vertebral compression  fractures.Forteo started 08/07/21.  Alkaline phosphatase is elevated due to chronic Forteo use.  Her DEXA scan will be due in August 2022.  Has she recently started Forteo we will plan to get DEXA scan in August 2023.  History of vertebral compression fracture  Vitamin D deficiency-vitamin D was normal in July 15, 2021.  History of sleep apnea  History of migraine  History of depression  Orders: Orders Placed This Encounter  Procedures   Trigger Point Inj   No orders of the defined types were placed in this encounter.    Follow-Up Instructions: Return in about 6 months (around 12/07/2022) for Osteoarthritis, Osteoporosis, Gout.   Bo Merino, MD  Note - This record has been created using Editor, commissioning.  Chart creation errors have been sought, but may not always  have been located. Such creation errors do not reflect on  the standard of medical care.

## 2022-06-04 ENCOUNTER — Ambulatory Visit: Payer: Medicare Other

## 2022-06-04 ENCOUNTER — Other Ambulatory Visit: Payer: Self-pay | Admitting: Family Medicine

## 2022-06-05 ENCOUNTER — Telehealth: Payer: Self-pay | Admitting: Urology

## 2022-06-05 NOTE — Telephone Encounter (Addendum)
DOS - 06/29/22  ARTHRODESIS INTERPHAL JOINT RIGHT --- 26712 TENOLYSIS  --- [id number]  [employer] EFFECTIVE DATE - 11/26/21  PLAN DEDUCTIBLE - $0.00 OUT OF POCKET - $4,500.00 W/ $3,551.27 REMAINING COINSURANCE - 0% COPAY - $325.00  PER [employer] WEBSITE FOR CPT CODE 98338 Notification or Prior Authorization is not required for the requested services  Decision ID #:[id number]  PER [place] [id number] Notification or Prior Authorization is not required for the requested services  Decision ID #:[id number]

## 2022-06-06 ENCOUNTER — Ambulatory Visit: Payer: Medicare Other | Attending: Family Medicine | Admitting: Physical Therapy

## 2022-06-06 ENCOUNTER — Ambulatory Visit: Payer: Medicare Other | Admitting: Rheumatology

## 2022-06-06 ENCOUNTER — Encounter: Payer: Self-pay | Admitting: Rheumatology

## 2022-06-06 ENCOUNTER — Encounter: Payer: Self-pay | Admitting: Physical Therapy

## 2022-06-06 VITALS — BP 116/75 | HR 67 | Ht 62.0 in | Wt 186.6 lb

## 2022-06-06 DIAGNOSIS — M62838 Other muscle spasm: Secondary | ICD-10-CM

## 2022-06-06 DIAGNOSIS — Z8669 Personal history of other diseases of the nervous system and sense organs: Secondary | ICD-10-CM

## 2022-06-06 DIAGNOSIS — M25571 Pain in right ankle and joints of right foot: Secondary | ICD-10-CM | POA: Diagnosis not present

## 2022-06-06 DIAGNOSIS — Z79899 Other long term (current) drug therapy: Secondary | ICD-10-CM

## 2022-06-06 DIAGNOSIS — M1A09X0 Idiopathic chronic gout, multiple sites, without tophus (tophi): Secondary | ICD-10-CM | POA: Diagnosis not present

## 2022-06-06 DIAGNOSIS — T148XXA Other injury of unspecified body region, initial encounter: Secondary | ICD-10-CM | POA: Diagnosis not present

## 2022-06-06 DIAGNOSIS — M160 Bilateral primary osteoarthritis of hip: Secondary | ICD-10-CM | POA: Diagnosis not present

## 2022-06-06 DIAGNOSIS — Z8659 Personal history of other mental and behavioral disorders: Secondary | ICD-10-CM

## 2022-06-06 DIAGNOSIS — R262 Difficulty in walking, not elsewhere classified: Secondary | ICD-10-CM

## 2022-06-06 DIAGNOSIS — M5136 Other intervertebral disc degeneration, lumbar region: Secondary | ICD-10-CM

## 2022-06-06 DIAGNOSIS — M19041 Primary osteoarthritis, right hand: Secondary | ICD-10-CM | POA: Diagnosis not present

## 2022-06-06 DIAGNOSIS — Z96653 Presence of artificial knee joint, bilateral: Secondary | ICD-10-CM

## 2022-06-06 DIAGNOSIS — M19071 Primary osteoarthritis, right ankle and foot: Secondary | ICD-10-CM

## 2022-06-06 DIAGNOSIS — M8000XA Age-related osteoporosis with current pathological fracture, unspecified site, initial encounter for fracture: Secondary | ICD-10-CM

## 2022-06-06 DIAGNOSIS — E559 Vitamin D deficiency, unspecified: Secondary | ICD-10-CM

## 2022-06-06 DIAGNOSIS — L97512 Non-pressure chronic ulcer of other part of right foot with fat layer exposed: Secondary | ICD-10-CM

## 2022-06-06 DIAGNOSIS — R5383 Other fatigue: Secondary | ICD-10-CM | POA: Diagnosis not present

## 2022-06-06 DIAGNOSIS — M5030 Other cervical disc degeneration, unspecified cervical region: Secondary | ICD-10-CM | POA: Diagnosis not present

## 2022-06-06 DIAGNOSIS — R600 Localized edema: Secondary | ICD-10-CM | POA: Diagnosis not present

## 2022-06-06 DIAGNOSIS — M797 Fibromyalgia: Secondary | ICD-10-CM

## 2022-06-06 DIAGNOSIS — Z8781 Personal history of (healed) traumatic fracture: Secondary | ICD-10-CM

## 2022-06-06 DIAGNOSIS — M7661 Achilles tendinitis, right leg: Secondary | ICD-10-CM

## 2022-06-06 DIAGNOSIS — M19042 Primary osteoarthritis, left hand: Secondary | ICD-10-CM

## 2022-06-06 DIAGNOSIS — M19072 Primary osteoarthritis, left ankle and foot: Secondary | ICD-10-CM

## 2022-06-06 MED ORDER — LIDOCAINE HCL 1 % IJ SOLN
0.5000 mL | INTRAMUSCULAR | Status: AC | PRN
  Administered 2022-06-06: .5 mL

## 2022-06-06 MED ORDER — TRIAMCINOLONE ACETONIDE 40 MG/ML IJ SUSP
10.0000 mg | INTRAMUSCULAR | Status: AC | PRN
  Administered 2022-06-06: 10 mg via INTRAMUSCULAR

## 2022-06-06 NOTE — Therapy (Signed)
Progress Note Reporting Period 5/20 to 7/12  See note below for Objective Data and Assessment of Progress/Goals.      Patient Name: [name] MRN: [id number] DOB:[birth year], 66 y.o., female Today's Date: 06/06/2022  PCP: Rita Ohara, MD REFERRING PROVIDER:  [name]   PT End of Session - 06/06/22 1358     Visit Number 10    Number of Visits 12    Date for PT Re-Evaluation 06/09/22    Authorization Type [employer] - FOTO    Progress Note Due on Visit 10    PT Start Time 1400    PT Stop Time 1441    PT Time Calculation (min) 41 min    Activity Tolerance Patient tolerated treatment well    Behavior During Therapy WFL for tasks assessed/performed              Past Medical History:  Diagnosis Date   Anemia    previously followed by Dr. Jamse Arn for anemia and elevated platelets   Anxiety    C. difficile colitis 10/01/2012   treated by [facility]   Chronic fatigue syndrome    Closed wedge compression fracture of T8 vertebra ([place]) 06/2021   DDD (degenerative disc disease), lumbar 08/19/2014   and facet arthroplasty & left lumbar radiculopathy (Dr.Ramos)   Depression    Dyssynergia    dyssynergenic defecation, contributing to fecal incontinence.   Edema    Fibromyalgia    Gastroparesis    followed at [facility]   GERD (gastroesophageal reflux disease)    History of kidney stones    History of vertebral fracture 06/30/2021   Hyperlipidemia    Kidney stone    Lumbar radiculopathy    Migraine    Neuropathy    Obstructive sleep apnea    Does  wear  CPAP   Osteoporosis    Paresthesia    Dr. Everette Rank at [facility]   Pelvic floor dysfunction    pelvic floor dyssynergy   Plantar fasciitis 02/2011   R foot   Pneumonia    2012   PONV (postoperative nausea and vomiting)    pt states has gastroparesis has difficulty taking antibiotics and narcotics has severe nausea and vomiting    Restless leg syndrome    S/P endometrial ablation 08/09/2006   Novasure  Ablation   S/P epidural steroid injection 09/20/2014   Dr.Ramos   Tremor    Dr. Everette Rank   Urinary frequency    Urinary incontinence    Past Surgical History:  Procedure Laterality Date   CHOLECYSTECTOMY  07/2004   ENDOMETRIAL ABLATION  08/09/2006   Dr. Valentina Shaggy Ablation   FACET JOINT INJECTION  04/17/2017   Left L4-5 and L5-S1   GASTRIC ROUX-EN-Y N/A 09/29/2019   Procedure: LAPAROSCOPIC ROUX-EN-Y GASTRIC BYPASS WITH UPPER ENDOSCOPY, ERAS Pathway;  Surgeon: Johnathan Hausen, MD;  Location: [facility];  Service: General;  Laterality: N/A;   [place]   R knee, Dr. Eddie Dibbles, torn cartilage   KYPHOPLASTY  11/06/2021   T8-T9-Nundkumar   RETINAL LASER PROCEDURE Right 08/28/2018   laser retinopexy   RIGHT/LEFT HEART CATH AND CORONARY ANGIOGRAPHY N/A 01/01/2018   Procedure: RIGHT/LEFT HEART CATH AND CORONARY ANGIOGRAPHY;  Surgeon: Sherren Mocha, MD;  Location: [place] [facility];  Service: Cardiovascular;  Laterality: N/A;   TONSILLECTOMY  1968   TONSILLECTOMY     TOTAL KNEE ARTHROPLASTY Right 09/05/2015   Procedure: RIGHT TOTAL KNEE ARTHROPLASTY;  Surgeon: Gaynelle Arabian,  MD;  Location: [facility];  Service: Orthopedics;  Laterality: Right;   TOTAL KNEE ARTHROPLASTY Left 07/14/2018   Procedure: LEFT TOTAL KNEE ARTHROPLASTY;  Surgeon: Gaynelle Arabian, MD;  Location: [facility];  Service: Orthopedics;  Laterality: Left;   ULTRASOUND GUIDANCE FOR VASCULAR ACCESS  01/01/2018   Procedure: Ultrasound Guidance For Vascular Access;  Surgeon: Sherren Mocha, MD;  Location: [place] [facility];  Service: Cardiovascular;;   UMBILICAL HERNIA REPAIR N/A 09/29/2019   Procedure: HERNIA REPAIR UMBILICAL ADULT;  Surgeon: Johnathan Hausen, MD;  Location: [facility];  Service: General;  Laterality: N/A;   Patient Active Problem List   Diagnosis Date Noted   Osteopenia 08/04/2021   Acute right-sided weakness 01/22/2021   Peripheral polyneuropathy 01/22/2021   History of migraine  headaches 01/22/2021   Difficulty with speech    Gastric bypass status for obesity 09/29/2019   Aftercare 08/12/2018   Pain in left knee 06/03/2018   Dyspnea 12/26/2017   Restrictive lung disease secondary to obesity 12/26/2017   Atypical chest pain 12/06/2017   Sinus tachycardia 12/06/2017   Chest pain 12/06/2017   DJD (degenerative joint disease), cervical 12/24/2016   Primary osteoarthritis of both hips 12/24/2016   Primary osteoarthritis of both knees 12/24/2016   H/O total knee replacement, right 12/24/2016   Spondylosis of lumbar region without myelopathy or radiculopathy 12/24/2016   Lumbosacral spondylosis without myelopathy 12/24/2016   Acute gout 05/30/2016   Gout 05/30/2016   Myalgia 03/29/2016   Other long term (current) drug therapy 03/29/2016   Idiopathic peripheral neuropathy 03/29/2016   Cannot sleep 03/29/2016   Migraine without aura and responsive to treatment 03/29/2016   Multifocal myoclonus 03/29/2016   Restless leg 03/29/2016   Has a tremor 03/29/2016   History of aspiration pneumonitis 01/25/2016   History of acute bronchitis 01/25/2016   LPRD (laryngopharyngeal reflux disease) 01/25/2016   Imbalance 01/09/2016   Serotonin syndrome 12/22/2015   Essential hypertension 09/10/2015   OA (osteoarthritis) of knee 09/05/2015   Obesity 05/17/2015   OSA (obstructive sleep apnea) 03/16/2013   Iron deficiency 11/06/2012   Thrombocythemia 11/06/2012   Leukocytosis 11/05/2012   Impaired fasting glucose 07/23/2012   Bronchitis 04/03/2012   Kidney stone on left side 03/05/2012   Kidney cysts 03/04/2012   Loose stools 03/04/2012   Urinary frequency 12/04/2011   Restless leg syndrome 12/04/2011   Polypharmacy 12/04/2011   Bladder incontinence 12/04/2011   Fibromyalgia    Mixed hyperlipidemia    Edema    S/P endometrial ablation    Allergic rhinitis 09/18/2011   Depression, major, single episode, in partial remission ([place]) 04/21/2011   PRECORDIAL PAIN  02/17/2010   Anxiety state 01/30/2010   Migraine 01/30/2010   GERD without esophagitis 01/30/2010   Gastroparesis 01/30/2010    THERAPY DIAG:  Pain in right ankle and joints of right foot  Localized edema  Difficulty in walking, not elsewhere classified  REFERRING DIAG: Referral diagnosis: Achilles tendinitis, right leg [M76.61], Gastrocnemius equinus of right lower extremity [M21.861]  PERTINENT HISTORY: Chronic fatigue, T8 compression fx (2022), Osteoporosis, plantar fasciitis, idiopathic neuropathy (bil feet), fibromyalgia, R great toe pain  PRECAUTIONS/RESTRICTIONS:   Fall  SUBJECTIVE: Pt reports that she has increased foot pain on the lateral heal (not consistent with achilles)  Pain:  Are you having pain? Yes Pain location: R achilles tendon (insertional?) NPRS scale:  current 5/10  Max pain: 8/10 Aggravating factors: walking, standing, activity Relieving factors: resting, ice Pain description: intermittent, constant, sharp, and aching Stage: Chronic  OBJECTIVE:  GENERAL OBSERVATION/GAIT:                     Antalgic gait with reduced time in stance R.  Low arch bil with navicular drop   SENSATION:          Light touch: Deficits bil feet   PALPATION: TTP calcaneal insertion of R achilles   LE MMT:   MMT Right 04/14/2022 Left 04/14/2022  Hip flexion (L2, L3) N (ratcheting) n  Knee extension (L3) n n  Knee flexion n n  Hip abduction      Hip extension      Hip external rotation      Hip internal rotation      Hip adduction      Ankle dorsiflexion (L4)      Ankle plantarflexion (S1) D D  Ankle inversion      Ankle eversion      Great Toe ext (L5)      Grossly        (Blank rows = not tested, score listed is out of 5 possible points.  N = WNL, D = diminished, C = clear for gross weakness with myotome testing, * = concordant pain with testing)   LE ROM:   ROM Right 04/14/2022 Left 04/14/2022  Hip flexion      Hip extension      Hip abduction 3+  4  Hip adduction      Hip internal rotation      Hip external rotation      Knee flexion      Knee extension      Ankle dorsiflexion 5 5  Ankle plantarflexion      Ankle inversion      Ankle eversion        (Blank rows = not tested, N = WNL, * = concordant pain with testing)   Functional Tests   Eval (04/14/2022)      Progressive balance screen (highest level completed for >/= 10''):   Tandem: R in rear 10'', L in rear 2''                                                                                                       PATIENT SURVEYS:  FOTO 52 -> 58     TODAY'S TREATMENT: [place]/HM - taping and instructing pt on taping practices at home.  Ridged achilles taping   Creating, reviewing, and completing below HEP     HOME EXERCISE PROGRAM:   Access Code: [facility] URL: [email or web address] Date: 04/14/2022 Prepared by: Shearon Balo   Exercises - Seated Calf Stretch with Strap  - 3 x daily - 7 x weekly - 1 sets - 3 reps - 1 minute hold - Seated Ankle Plantarflexion with Resistance  - 1 x daily - 7 x weekly - 3 sets - 10 reps  ASTERISK SIGNS   Asterisk Signs Eval (04/14/2022) 5/30  6/6   6/13 6/20     Balance 3'' R in rear 30'' R semi tandem  45'' semi tandem (75%) on foam  71'' semi tandem (100%) on foam  45'' Tandem     heel raises       3x15 on step to neutral                                                       TREATMENT 7/12: Therapeutic Exercise: - Bike L3 57mwhile taking subjective and planning session with patient - knee ext machine - 20# - 3x10 - knee flexion machine - 25# - 3x10 - slant board stretch - 45'' x3 - knee bent heel raise - 3x15 - tib anterior toe raise - 3x10 - lateral walking with band - GTB at ankles - 3 laps - step up - 8'' - 2x10  -Lateral x10 (NT) - 4'' step down - 3x10 (NT)  Therapeutic Activity - collecting information for goals, checking progress, and reviewing with patient  NMRE: - SLS -  45'' bouts (difficulty) Rt (NT) - semi tandem (100%) on foam - 45'' bouts - tandem stance - 45''  Iontophoresis:  - 1 ml dexamethasone - 4-6 hour slow release patch - placed R ankle  TREATMENT 6/29: Therapeutic Exercise: - Bike L3 540mhile taking subjective and planning session with patient - knee ext machine - 20# - 3x10 - knee flexion machine - 25# - 3x10 - slant board stretch - 45'' x3 - heel raise  on step - 3x15 - tib anterior toe raise - 3x10 - lateral walking with band - GTB at ankles - 3 laps (NT) - step up - 8'' - 2x10  -Lateral x10 - 4'' step down - 3x10 NMRE: - SLS - 45'' bouts (difficulty) Rt - semi tandem (100%) on foam - 45'' bouts - tandem stance - 45'' Iontophoresis:  - 1 ml dexamethasone - 4-6 hour slow release patch - placed R ankle  TREATMENT 6/22:  Therapeutic Exercise: - Bike L3 51m50mile taking subjective and planning session with patient - knee ext machine - 20# - 3x10 - knee flexion machine - 25# - 3x10 (NT) - slant board stretch - 45'' x3 - heel raise  on step - 3x15 - tib anterior toe raise - 3x10 - lateral walking with band - GTB at ankles - 3 laps - step up - 6'' - 2x10 (NT)  -Lateral x10 - 4'' step down - 3x10  Neuromuscular re-ed: - SLS - 45'' bouts (difficulty) - semi tandem (100%) on foam - 45'' bouts - tandem stance - 45''  Iontophoresis:  - 1 ml dexamethasone - 4-6 hour slow release patch - placed R ankle  TREATMENT 6/20:  Therapeutic Exercise: - Bike L3 51m 67mle taking subjective and planning session with patient - knee ext machine - 20# - 3x10 (NT) - knee flexion machine - 25# - 3x10 (NT) - slant board stretch - 45'' x3 - heel raise  on step - 3x15 - FHL S/L heel raise - 3x10 - tib anterior toe raise - 3x10 - lateral walking with band - GTB at ankles - 3 laps - step up - 6'' - 2x10 (NT)  -Lateral x10 - 4'' step down - 3x10 - KT tape   Neuromuscular re-ed: - SLS - 45'' bouts (difficulty) - tandem stance -  45''    ASSESSMENT:   CLINICAL IMPRESSION: PeggKristyn progressed fair with therapy.  Improved impairments include: LE strength  and balance.  Functional improvements include: improved stability in gait, step navigation and transfers.  Progressions needed include: continued progressive loading of achilles as tolerated.  Barriers to progress include: chronic issue, neuropathy, diabetes.   Please see baseline and/or status section in "Goals" for specific progress on short term and long term goals established at evaluation.  I recommend continuation of PT to allow completion of remaining goals and continued functional progression.   OBJECTIVE IMPAIRMENTS: Pain, ankle ROM, ankle strength, hip strength, balance, gait   ACTIVITY LIMITATIONS: walking, standing, stairs   PERSONAL FACTORS: See medical history and pertinent history     REHAB POTENTIAL: Good   CLINICAL DECISION MAKING: Stable/uncomplicated   EVALUATION COMPLEXITY: Low     GOALS:     SHORT TERM GOALS: Target date: 05/05/2022   Aarianna will be >75% HEP compliant to improve carryover between sessions and facilitate independent management of condition   Evaluation (04/14/2022): ongoing Goal status: MET 6/6     LONG TERM GOALS: Target date: 06/09/2022   Daralyn will improve FOTO score to 58 as a proxy for functional improvement   Evaluation/Baseline (04/14/2022): 52 6/15: 52 7/12: 51 Goal status: ongoing     2.  Zannah will be able to walk for 30 min, not limited by pain    Evaluation/Baseline (04/14/2022): 5-10 min 6/15: 15 min 7/12: 20 min Goal status: progressing     3.  Nyari will be able to perform 3x15 heel raises on step, not limited by pain   Evaluation/Baseline (04/14/2022): limited by pain and strength 6/15: 3x10 with some pain 6/22: 3/15 with some pain Goal status: MET     4.  Chaunda will be able to stand for >45'' in tandem stance stance, to show a significant improvement in balance in order to reduce fall  risk    Evaluation/Baseline (04/14/2022): 3'', R in rear 6/15: 35'' 6/20: 73'' MET Goal status: MET       PLAN: PT FREQUENCY: 1-2x/week   PT DURATION: 8 weeks (Ending 06/09/2022)   PLANNED INTERVENTIONS: Therapeutic exercises, Aquatic therapy, Therapeutic activity, Neuro Muscular re-education, Gait training, Patient/Family education, Joint mobilization, Dry Needling, Electrical stimulation, Spinal mobilization and/or manipulation, Moist heat, Taping, Vasopneumatic device, Ionotophoresis 75m/ml Dexamethasone, and Manual therapy   PLAN FOR NEXT SESSION: progressive ankle strengthening, ionto, taping, hip and core strengthening    KKevan NyReinhartsen PT 06/06/2022, 2:49 PM

## 2022-06-08 ENCOUNTER — Encounter: Payer: Self-pay | Admitting: Physical Therapy

## 2022-06-08 ENCOUNTER — Ambulatory Visit: Payer: Medicare Other | Admitting: Physical Therapy

## 2022-06-08 DIAGNOSIS — R262 Difficulty in walking, not elsewhere classified: Secondary | ICD-10-CM | POA: Diagnosis not present

## 2022-06-08 DIAGNOSIS — M25571 Pain in right ankle and joints of right foot: Secondary | ICD-10-CM

## 2022-06-08 DIAGNOSIS — R600 Localized edema: Secondary | ICD-10-CM

## 2022-06-08 NOTE — Therapy (Signed)
PHYSICAL THERAPY DISCHARGE SUMMARY  Visits from Start of Care: 11  Current functional level related to goals / functional outcomes: See assessment/goals   Remaining deficits: See assessment/goals   Education / Equipment: HEP and D/C plans  Patient agrees to discharge. Patient goals were partially met. Patient is being discharged due to maximized rehab potential.      Patient Name: [name] MRN: [id number] DOB:[birth year], 66 y.o., female Today's Date: 06/08/2022  PCP: Rita Ohara, MD REFERRING PROVIDER:  [name]   PT End of Session - 06/08/22 1011     Visit Number 11    Number of Visits 12    Date for PT Re-Evaluation 06/09/22    Authorization Type [employer] Medicare - FOTO    Progress Note Due on Visit 10    PT Start Time 1005    PT Stop Time 1045    PT Time Calculation (min) 40 min    Activity Tolerance Patient tolerated treatment well    Behavior During Therapy WFL for tasks assessed/performed              Past Medical History:  Diagnosis Date   Anemia    previously followed by Dr. Jamse Arn for anemia and elevated platelets   Anxiety    C. difficile colitis 10/01/2012   treated by [facility]   Chronic fatigue syndrome    Closed wedge compression fracture of T8 vertebra ([place]) 06/2021   DDD (degenerative disc disease), lumbar 08/19/2014   and facet arthroplasty & left lumbar radiculopathy (Dr.Ramos)   Depression    Dyssynergia    dyssynergenic defecation, contributing to fecal incontinence.   Edema    Fibromyalgia    Gastroparesis    followed at [facility]   GERD (gastroesophageal reflux disease)    History of kidney stones    History of vertebral fracture 06/30/2021   Hyperlipidemia    Kidney stone    Lumbar radiculopathy    Migraine    Neuropathy    Obstructive sleep apnea    Does  wear  CPAP   Osteoporosis    Paresthesia    Dr. Everette Rank at [facility]   Pelvic floor dysfunction    pelvic floor dyssynergy   Plantar fasciitis 02/2011   R  foot   Pneumonia    2012   PONV (postoperative nausea and vomiting)    pt states has gastroparesis has difficulty taking antibiotics and narcotics has severe nausea and vomiting    Restless leg syndrome    S/P endometrial ablation 08/09/2006   Novasure Ablation   S/P epidural steroid injection 09/20/2014   Dr.Ramos   Tremor    Dr. Everette Rank   Urinary frequency    Urinary incontinence    Past Surgical History:  Procedure Laterality Date   CHOLECYSTECTOMY  07/2004   ENDOMETRIAL ABLATION  08/09/2006   Dr. Valentina Shaggy Ablation   FACET JOINT INJECTION  04/17/2017   Left L4-5 and L5-S1   GASTRIC ROUX-EN-Y N/A 09/29/2019   Procedure: LAPAROSCOPIC ROUX-EN-Y GASTRIC BYPASS WITH UPPER ENDOSCOPY, ERAS Pathway;  Surgeon: Johnathan Hausen, MD;  Location: [facility];  Service: General;  Laterality: N/A;   [place]   R knee, Dr. Eddie Dibbles, torn cartilage   KYPHOPLASTY  11/06/2021   T8-T9-Nundkumar   RETINAL LASER PROCEDURE Right 08/28/2018   laser retinopexy   RIGHT/LEFT HEART CATH AND CORONARY ANGIOGRAPHY N/A 01/01/2018   Procedure: RIGHT/LEFT HEART CATH AND CORONARY ANGIOGRAPHY;  Surgeon: Sherren Mocha, MD;  Location: [place] [facility];  Service: Cardiovascular;  Laterality: N/A;   TONSILLECTOMY  1968   TONSILLECTOMY     TOTAL KNEE ARTHROPLASTY Right 09/05/2015   Procedure: RIGHT TOTAL KNEE ARTHROPLASTY;  Surgeon: Gaynelle Arabian, MD;  Location: [facility];  Service: Orthopedics;  Laterality: Right;   TOTAL KNEE ARTHROPLASTY Left 07/14/2018   Procedure: LEFT TOTAL KNEE ARTHROPLASTY;  Surgeon: Gaynelle Arabian, MD;  Location: [facility];  Service: Orthopedics;  Laterality: Left;   ULTRASOUND GUIDANCE FOR VASCULAR ACCESS  01/01/2018   Procedure: Ultrasound Guidance For Vascular Access;  Surgeon: Sherren Mocha, MD;  Location: [place] [facility];  Service: Cardiovascular;;   UMBILICAL HERNIA REPAIR N/A 09/29/2019   Procedure: HERNIA REPAIR UMBILICAL ADULT;  Surgeon: Johnathan Hausen, MD;  Location: [facility];  Service: General;  Laterality: N/A;   Patient Active Problem List   Diagnosis Date Noted   Osteopenia 08/04/2021   Acute right-sided weakness 01/22/2021   Peripheral polyneuropathy 01/22/2021   History of migraine headaches 01/22/2021   Difficulty with speech    Gastric bypass status for obesity 09/29/2019   Aftercare 08/12/2018   Pain in left knee 06/03/2018   Dyspnea 12/26/2017   Restrictive lung disease secondary to obesity 12/26/2017   Atypical chest pain 12/06/2017   Sinus tachycardia 12/06/2017   Chest pain 12/06/2017   DJD (degenerative joint disease), cervical 12/24/2016   Primary osteoarthritis of both hips 12/24/2016   Primary osteoarthritis of both knees 12/24/2016   H/O total knee replacement, right 12/24/2016   Spondylosis of lumbar region without myelopathy or radiculopathy 12/24/2016   Lumbosacral spondylosis without myelopathy 12/24/2016   Acute gout 05/30/2016   Gout 05/30/2016   Myalgia 03/29/2016   Other long term (current) drug therapy 03/29/2016   Idiopathic peripheral neuropathy 03/29/2016   Cannot sleep 03/29/2016   Migraine without aura and responsive to treatment 03/29/2016   Multifocal myoclonus 03/29/2016   Restless leg 03/29/2016   Has a tremor 03/29/2016   History of aspiration pneumonitis 01/25/2016   History of acute bronchitis 01/25/2016   LPRD (laryngopharyngeal reflux disease) 01/25/2016   Imbalance 01/09/2016   Serotonin syndrome 12/22/2015   Essential hypertension 09/10/2015   OA (osteoarthritis) of knee 09/05/2015   Obesity 05/17/2015   OSA (obstructive sleep apnea) 03/16/2013   Iron deficiency 11/06/2012   Thrombocythemia 11/06/2012   Leukocytosis 11/05/2012   Impaired fasting glucose 07/23/2012   Bronchitis 04/03/2012   Kidney stone on left side 03/05/2012   Kidney cysts 03/04/2012   Loose stools 03/04/2012   Urinary frequency 12/04/2011   Restless leg syndrome 12/04/2011   Polypharmacy  12/04/2011   Bladder incontinence 12/04/2011   Fibromyalgia    Mixed hyperlipidemia    Edema    S/P endometrial ablation    Allergic rhinitis 09/18/2011   Depression, major, single episode, in partial remission ([place]) 04/21/2011   PRECORDIAL PAIN 02/17/2010   Anxiety state 01/30/2010   Migraine 01/30/2010   GERD without esophagitis 01/30/2010   Gastroparesis 01/30/2010    THERAPY DIAG:  Pain in right ankle and joints of right foot  Localized edema  Difficulty in walking, not elsewhere classified  REFERRING DIAG: Referral diagnosis: Achilles tendinitis, right leg [M76.61], Gastrocnemius equinus of right lower extremity [M21.861]  PERTINENT HISTORY: Chronic fatigue, T8 compression fx (2022), Osteoporosis, plantar fasciitis, idiopathic neuropathy (bil feet), fibromyalgia, R great toe pain  PRECAUTIONS/RESTRICTIONS:   Fall  SUBJECTIVE: Pt reports that her heel pain is somewhat better today  Pain:  Are you having pain? Yes Pain location:  R achilles tendon (insertional?) NPRS scale:  current 3/10  Max pain: 5/10 Aggravating factors: walking, standing, activity Relieving factors: resting, ice Pain description: intermittent, constant, sharp, and aching Stage: Chronic  OBJECTIVE:   GENERAL OBSERVATION/GAIT:                     Antalgic gait with reduced time in stance R.  Low arch bil with navicular drop   SENSATION:          Light touch: Deficits bil feet   PALPATION: TTP calcaneal insertion of R achilles   LE MMT:   MMT Right 04/14/2022 Left 04/14/2022  Hip flexion (L2, L3) N (ratcheting) n  Knee extension (L3) n n  Knee flexion n n  Hip abduction      Hip extension      Hip external rotation      Hip internal rotation      Hip adduction      Ankle dorsiflexion (L4)      Ankle plantarflexion (S1) D D  Ankle inversion      Ankle eversion      Great Toe ext (L5)      Grossly        (Blank rows = not tested, score listed is out of 5 possible points.  N =  WNL, D = diminished, C = clear for gross weakness with myotome testing, * = concordant pain with testing)   LE ROM:   ROM Right 04/14/2022 Left 04/14/2022  Hip flexion      Hip extension      Hip abduction 3+ 4  Hip adduction      Hip internal rotation      Hip external rotation      Knee flexion      Knee extension      Ankle dorsiflexion 5 5  Ankle plantarflexion      Ankle inversion      Ankle eversion        (Blank rows = not tested, N = WNL, * = concordant pain with testing)   Functional Tests   Eval (04/14/2022)      Progressive balance screen (highest level completed for >/= 10''):   Tandem: R in rear 10'', L in rear 2''                                                                                                       PATIENT SURVEYS:  FOTO 52 -> 58     TODAY'S TREATMENT: [place]/HM - taping and instructing pt on taping practices at home.  Ridged achilles taping   Creating, reviewing, and completing below HEP     HOME EXERCISE PROGRAM:   Access Code: [facility] URL: [email or web address] Date: 04/14/2022 Prepared by: Shearon Balo   Exercises - Seated Calf Stretch with Strap  - 3 x daily - 7 x weekly - 1 sets - 3 reps - 1 minute hold - Seated Ankle Plantarflexion with Resistance  - 1 x daily - 7 x weekly - 3 sets - 10 reps  ASTERISK SIGNS  Asterisk Signs Eval (04/14/2022) 5/30  6/6   6/13 6/20     Balance 3'' R in rear 71'' R semi tandem  45'' semi tandem (75%) on foam   45'' semi tandem (100%) on foam  45'' Tandem     heel raises       3x15 on step to neutral                                                       TREATMENT 7/14: Therapeutic Exercise: - Bike L3 78mwhile taking subjective and planning session with patient - knee ext machine - 25# - 3x10 - knee flexion machine - 25# - 3x10 - slant board stretch - 45'' x3 - lateral walking with band - GTB at ankles - 3 laps  Therapeutic Activity - collecting  information for goals, checking progress, and reviewing with patient  NMRE: - SLS - 45'' bouts (difficulty) Rt (NT) - semi tandem (100%) on foam - 45'' bouts  Iontophoresis:  - 1 ml dexamethasone - 4-6 hour slow release patch - placed R ankle  TREATMENT 7/12: Therapeutic Exercise: - Bike L3 552mhile taking subjective and planning session with patient - knee ext machine - 20# - 3x10 - knee flexion machine - 25# - 3x10 - slant board stretch - 45'' x3 - knee bent heel raise - 3x15 - tib anterior toe raise - 3x10 - lateral walking with band - GTB at ankles - 3 laps - step up - 8'' - 2x10  -Lateral x10 (NT) - 4'' step down - 3x10 (NT)  Therapeutic Activity - collecting information for goals, checking progress, and reviewing with patient  NMRE: - SLS - 45'' bouts (difficulty) Rt (NT) - semi tandem (100%) on foam - 45'' bouts - tandem stance - 45''  Iontophoresis:  - 1 ml dexamethasone - 4-6 hour slow release patch - placed R ankle  TREATMENT 6/29: Therapeutic Exercise: - Bike L3 52m72mile taking subjective and planning session with patient - knee ext machine - 20# - 3x10 - knee flexion machine - 25# - 3x10 - slant board stretch - 45'' x3 - heel raise  on step - 3x15 - tib anterior toe raise - 3x10 - lateral walking with band - GTB at ankles - 3 laps (NT) - step up - 8'' - 2x10  -Lateral x10 - 4'' step down - 3x10 NMRE: - SLS - 45'' bouts (difficulty) Rt - semi tandem (100%) on foam - 45'' bouts - tandem stance - 45'' Iontophoresis:  - 1 ml dexamethasone - 4-6 hour slow release patch - placed R ankle  TREATMENT 6/22:  Therapeutic Exercise: - Bike L3 52m 67mle taking subjective and planning session with patient - knee ext machine - 20# - 3x10 - knee flexion machine - 25# - 3x10 (NT) - slant board stretch - 45'' x3 - heel raise  on step - 3x15 - tib anterior toe raise - 3x10 - lateral walking with band - GTB at ankles - 3 laps - step up - 6'' - 2x10  (NT)  -Lateral x10 - 4'' step down - 3x10  Neuromuscular re-ed: - SLS - 45'' bouts (difficulty) - semi tandem (100%) on foam - 45'' bouts - tandem stance - 45''  Iontophoresis:  - 1 ml dexamethasone - 4-6 hour slow release patch -  placed R ankle  TREATMENT 6/20:  Therapeutic Exercise: - Bike L3 100mwhile taking subjective and planning session with patient - knee ext machine - 20# - 3x10 (NT) - knee flexion machine - 25# - 3x10 (NT) - slant board stretch - 45'' x3 - heel raise  on step - 3x15 - FHL S/L heel raise - 3x10 - tib anterior toe raise - 3x10 - lateral walking with band - GTB at ankles - 3 laps - step up - 6'' - 2x10 (NT)  -Lateral x10 - 4'' step down - 3x10 - KT tape   Neuromuscular re-ed: - SLS - 45'' bouts (difficulty) - tandem stance - 45''    ASSESSMENT:   CLINICAL IMPRESSION: PKANYLAH MUENCHhas progressed fair with therapy.  IImproved impairments include: LE strength and balance.  Functional improvements include: improved stability in gait, step navigation and transfers.  Progressions needed include: continued progressive loading of achilles as tolerated.  Barriers to progress include: chronic issue, neuropathy, diabetes.  PHomerhas improved functionally with therapy but continues to have moderate amounts of pain.  Please see GOALS section for progress on short term and long term goals established at evaluation.  I recommend D/C home with HEP; pt agrees with plan.    OBJECTIVE IMPAIRMENTS: Pain, ankle ROM, ankle strength, hip strength, balance, gait   ACTIVITY LIMITATIONS: walking, standing, stairs   PERSONAL FACTORS: See medical history and pertinent history     REHAB POTENTIAL: Good   CLINICAL DECISION MAKING: Stable/uncomplicated   EVALUATION COMPLEXITY: Low     GOALS:     SHORT TERM GOALS: Target date: 05/05/2022   PKamishawill be >75% HEP compliant to improve carryover between sessions and facilitate independent management of condition    Evaluation (04/14/2022): ongoing Goal status: MET 6/6     LONG TERM GOALS: Target date: 06/09/2022   PCaroleannwill improve FOTO score to 58 as a proxy for functional improvement   Evaluation/Baseline (04/14/2022): 52 6/15: 52 7/12: 51 7/14: 60 Goal status: MET     2.  PCarmelitawill be able to walk for 30 min, not limited by pain    Evaluation/Baseline (04/14/2022): 5-10 min 6/15: 15 min 7/12: 20 min Goal status: Partially met     3.  PYahairawill be able to perform 3x15 heel raises on step, not limited by pain   Evaluation/Baseline (04/14/2022): limited by pain and strength 6/15: 3x10 with some pain 6/22: 3/15 with some pain Goal status: MET     4.  POluwatosinwill be able to stand for >45'' in tandem stance stance, to show a significant improvement in balance in order to reduce fall risk    Evaluation/Baseline (04/14/2022): 3'', R in rear 6/15: 35'' 6/20: 434' MET Goal status: MET       PLAN: PT FREQUENCY: 1-2x/week   PT DURATION: 8 weeks (Ending 06/09/2022)   PLANNED INTERVENTIONS: Therapeutic exercises, Aquatic therapy, Therapeutic activity, Neuro Muscular re-education, Gait training, Patient/Family education, Joint mobilization, Dry Needling, Electrical stimulation, Spinal mobilization and/or manipulation, Moist heat, Taping, Vasopneumatic device, Ionotophoresis 485mml Dexamethasone, and Manual therapy   PLAN FOR NEXT SESSION: progressive ankle strengthening, ionto, taping, hip and core strengthening    KaKevan Nyeinhartsen PT 06/08/2022, 11:30 AM

## 2022-06-11 ENCOUNTER — Ambulatory Visit: Payer: Medicare Other | Admitting: Podiatry

## 2022-06-11 DIAGNOSIS — M7661 Achilles tendinitis, right leg: Secondary | ICD-10-CM | POA: Diagnosis not present

## 2022-06-12 ENCOUNTER — Telehealth: Payer: Self-pay | Admitting: Podiatry

## 2022-06-12 ENCOUNTER — Other Ambulatory Visit: Payer: Self-pay | Admitting: Physician Assistant

## 2022-06-12 ENCOUNTER — Other Ambulatory Visit (HOSPITAL_COMMUNITY): Payer: Self-pay

## 2022-06-12 MED ORDER — ARMODAFINIL 250 MG PO TABS
ORAL_TABLET | ORAL | 0 refills | Status: DC
Start: 2022-06-12 — End: 2022-07-26
  Filled 2022-06-12 – 2022-06-13 (×2): qty 30, 30d supply, fill #0

## 2022-06-12 MED ORDER — TRAMADOL HCL 50 MG PO TABS: 50.0000 mg | ORAL_TABLET | Freq: Four times a day (QID) | ORAL | 0 refills | Status: AC | PRN

## 2022-06-12 NOTE — Telephone Encounter (Signed)
Next Visit: 12/10/2022  Last Visit: 06/06/2022  Last Fill: 05/16/2022  Dx: Other fatigue   Current Dose per office note on 06/06/2022:  armodafinil 250 mg daily with breakfast for many years.  I had a detailed discussion with patient regarding tapering off armodafinil as some she is not working now.  Tapering off the medication over the next 2 months was advised.  We will refill it for the next 2 months.  We will stop providing refills on armodafinil in September 2023.   Okay to refill Armodafinil?

## 2022-06-12 NOTE — Progress Notes (Signed)
  Subjective:  Patient ID: [name], female    DOB: May 02, 1956,  MRN: [id number]  Chief Complaint  Patient presents with   Tendonitis    6wk for after MRI to review, re-check Achilles tendon.    66 y.o. female presents with the above complaint. History confirmed with patient.  She returns today for surgical planning visit for the right great toe arthritis from her gout as well as follow-up on her Achilles tendinitis.  The Achilles tendon is still been very painful for her and is not getting much better.  She not taking any of the anti-inflammatory because she discussed with her primary and that it may worsen her gastric reflux disease   Interval history: She returns for follow-up after completing the MRI overall the Achilles has improved some but has not completely resolved Objective:  Physical Exam: warm, good capillary refill, no trophic changes or ulcerative lesions, normal DP and PT pulses, normal sensory exam, and there is severe arthrosis of the IPJ of the right hallux with deformity and edema she has pain and tenderness in the mid substance of the Achilles tendon as well as the insertion now.  Gastrocnemius equinus is noted  X-ray 12/20/2021 showed significant arthritis of the IPJ of the right hallux, Haglund deformity as well as minimal calcaneal spurring    Assessment:   1. Achilles tendinitis, right leg        Plan:  Patient was evaluated and treated and all questions answered.  Surgery is currently scheduled for right hallux IPJ arthrodesis currently.  We reviewed the results of her MRI and her clinical progress with her Achilles tendinosis.  I discussed with her that at this point I would not recommend open repair of the Achilles with detachment and reattachment and would recommend we proceed with a minimally invasive option first such as Topaz and platelet rich plasma injection.  We discussed the rationale behind this as well as the risk benefits and potential  complications.  This should not add any major additional recovery time beyond what she will need for her IPJ arthrodesis.  Her informed consent was updated and added to her procedure list.  I will see her at surgery  No follow-ups on file.

## 2022-06-12 NOTE — Telephone Encounter (Signed)
Pt called stating she forgot to ask you yesterday at the appt but is wanting to know if you would send in some tramadol for her since her surgery is in a few weeks. The pharmacy is correct in the chart.

## 2022-06-13 ENCOUNTER — Other Ambulatory Visit (HOSPITAL_COMMUNITY): Payer: Self-pay

## 2022-06-13 NOTE — Telephone Encounter (Signed)
Notified pt medication was sent in and to only take as needed.

## 2022-06-14 ENCOUNTER — Encounter: Payer: Medicare Other | Admitting: Podiatry

## 2022-06-29 ENCOUNTER — Other Ambulatory Visit: Payer: Self-pay | Admitting: Podiatry

## 2022-06-29 DIAGNOSIS — M7661 Achilles tendinitis, right leg: Secondary | ICD-10-CM | POA: Diagnosis not present

## 2022-06-29 DIAGNOSIS — M19271 Secondary osteoarthritis, right ankle and foot: Secondary | ICD-10-CM | POA: Diagnosis not present

## 2022-06-29 DIAGNOSIS — G8918 Other acute postprocedural pain: Secondary | ICD-10-CM | POA: Diagnosis not present

## 2022-06-29 DIAGNOSIS — M19071 Primary osteoarthritis, right ankle and foot: Secondary | ICD-10-CM | POA: Diagnosis not present

## 2022-06-29 HISTORY — PX: FOOT SURGERY: SHX648

## 2022-06-29 MED ORDER — HYDROCODONE-ACETAMINOPHEN 5-325 MG PO TABS: 1.0000 | ORAL_TABLET | ORAL | 0 refills | Status: AC | PRN

## 2022-06-29 NOTE — Progress Notes (Signed)
06/29/22 IPJ fusion, topaz Achilles and PRP injection

## 2022-07-05 ENCOUNTER — Ambulatory Visit (INDEPENDENT_AMBULATORY_CARE_PROVIDER_SITE_OTHER): Payer: Medicare Other

## 2022-07-05 ENCOUNTER — Ambulatory Visit (INDEPENDENT_AMBULATORY_CARE_PROVIDER_SITE_OTHER): Payer: Medicare Other | Admitting: Podiatry

## 2022-07-05 DIAGNOSIS — M109 Gout, unspecified: Secondary | ICD-10-CM

## 2022-07-05 MED ORDER — CEPHALEXIN 500 MG PO CAPS: 500.0000 mg | ORAL_CAPSULE | Freq: Three times a day (TID) | ORAL | 0 refills | Status: DC

## 2022-07-09 NOTE — Progress Notes (Signed)
  Subjective:  Patient ID: [name], female    DOB: 28-Dec-1955,  MRN: [id number]  Chief Complaint  Patient presents with   Routine Post Op    POV #1 DOS 06/29/2022 FUSION OF JOINT IN BIG TOE RT FOOT    66 y.o. female returns for post-op check.  Says it feels well with swollen  Review of Systems: Negative except as noted in the HPI. Denies N/V/F/Ch.   Objective:  There were no vitals filed for this visit. There is no height or weight on file to calculate BMI. Constitutional Well developed. Well nourished.  Vascular Foot warm and well perfused. Capillary refill normal to all digits.  Calf is soft and supple, no posterior calf or knee pain, negative Homans' sign  Neurologic Normal speech. Oriented to person, place, and time. Epicritic sensation to light touch grossly present bilaterally.  Dermatologic Incision remains well coapted.  There is periwound erythema and some serous drainage  Orthopedic: Tenderness to palpation noted about the surgical site.   Multiple view plain film radiographs: Good correction noted with implants in position Assessment:   1. Gouty arthritis of toe of right foot    Plan:  Patient was evaluated and treated and all questions answered.  S/p foot surgery right -Does seem to be developing a postoperative infection.  I recommended Keflex and she was placed on this.  She will be seen back next week on the nursing visit.  Advised on signs and symptoms of worsening infection including local and systemic signs and to call if these worsen -XR: Stable fixation noted reviewed with patient -WB Status: WBAT in CAM boot -Sutures: Will not be removed for at least 2 weeks. -Medications: Keflex Rx sent to pharmacy -Foot redressed.  Return in about 1 week (around 07/12/2022) for nurse visit .

## 2022-07-11 ENCOUNTER — Ambulatory Visit (INDEPENDENT_AMBULATORY_CARE_PROVIDER_SITE_OTHER): Payer: Medicare Other | Admitting: Podiatry

## 2022-07-11 ENCOUNTER — Telehealth: Payer: Self-pay | Admitting: Podiatry

## 2022-07-11 ENCOUNTER — Ambulatory Visit: Payer: Medicare Other | Admitting: Family Medicine

## 2022-07-11 ENCOUNTER — Encounter: Payer: Self-pay | Admitting: Podiatry

## 2022-07-11 VITALS — BP 114/56 | HR 74 | Temp 98.8°F

## 2022-07-11 DIAGNOSIS — M109 Gout, unspecified: Secondary | ICD-10-CM

## 2022-07-11 NOTE — Progress Notes (Deleted)
Patient seen today for re-evaluation of right hallux. Patient denies any fever, chest pain, vomiting or chills. Vitals signs were obtained today; within normal limits. Sutures are intact. Patient was assessed by Dr. Posey Pronto and provider stated there is no signs of infection. Patient will continue taking Keflex as ordered and will return in a week to be seen by Dr. Sherryle Lis.

## 2022-07-11 NOTE — Telephone Encounter (Signed)
Patient called and stated that her sx site has some pain near the great toe on the right foot. She went ahead and changed the dressing because it was coming off. She wanted to know if she should wait to see the nurse tomorrow or come in today or tomorrow to see Dr. Sherryle Lis.

## 2022-07-11 NOTE — Progress Notes (Signed)
Patient seen today for re-evaluation of right hallux. Patient denies any fever, chest pain, vomiting or chills. Vitals signs were obtained today; within normal limits. Sutures are intact. Patient was assessed by Dr. Posey Pronto and provider stated there is no signs of infection. Patient will continue taking Keflex as ordered and will return in a week to be seen by Dr. Sherryle Lis.

## 2022-07-12 ENCOUNTER — Other Ambulatory Visit: Payer: Medicare Other

## 2022-07-15 ENCOUNTER — Other Ambulatory Visit: Payer: Self-pay | Admitting: Rheumatology

## 2022-07-15 DIAGNOSIS — M8000XA Age-related osteoporosis with current pathological fracture, unspecified site, initial encounter for fracture: Secondary | ICD-10-CM

## 2022-07-16 NOTE — Telephone Encounter (Addendum)
Next Visit: 12/10/2022  Last Visit: 06/06/2022  Last Fill: 05/03/2022  DX: Age-related osteoporosis with current pathological fracture, initial encounter  Current Dose per office note 06/06/2022: not dicussed   Labs: 02/05/2022 Alk. Phos 225, ALT 33, Glucose 101  Patient advised she is due to update labs and will come in this week to update.   Okay to refill Forteo?

## 2022-07-17 ENCOUNTER — Other Ambulatory Visit: Payer: Self-pay | Admitting: *Deleted

## 2022-07-17 ENCOUNTER — Ambulatory Visit (INDEPENDENT_AMBULATORY_CARE_PROVIDER_SITE_OTHER): Payer: Medicare Other

## 2022-07-17 ENCOUNTER — Ambulatory Visit (INDEPENDENT_AMBULATORY_CARE_PROVIDER_SITE_OTHER): Payer: Medicare Other | Admitting: Podiatry

## 2022-07-17 DIAGNOSIS — Z79899 Other long term (current) drug therapy: Secondary | ICD-10-CM

## 2022-07-17 DIAGNOSIS — M2041 Other hammer toe(s) (acquired), right foot: Secondary | ICD-10-CM

## 2022-07-17 DIAGNOSIS — M1A09X0 Idiopathic chronic gout, multiple sites, without tophus (tophi): Secondary | ICD-10-CM

## 2022-07-17 DIAGNOSIS — M109 Gout, unspecified: Secondary | ICD-10-CM

## 2022-07-17 DIAGNOSIS — T8142XA Infection following a procedure, deep incisional surgical site, initial encounter: Secondary | ICD-10-CM

## 2022-07-17 DIAGNOSIS — M8000XA Age-related osteoporosis with current pathological fracture, unspecified site, initial encounter for fracture: Secondary | ICD-10-CM

## 2022-07-18 LAB — CBC WITH DIFFERENTIAL/PLATELET
Basophils Absolute: 0.1 10*3/uL (ref 0.0–0.2)
Basos: 1 %
EOS (ABSOLUTE): 0.3 10*3/uL (ref 0.0–0.4)
Eos: 3 %
Hematocrit: 38 % (ref 34.0–46.6)
Hemoglobin: 12.5 g/dL (ref 11.1–15.9)
Immature Grans (Abs): 0.1 10*3/uL (ref 0.0–0.1)
Immature Granulocytes: 1 %
Lymphocytes Absolute: 3.2 10*3/uL — ABNORMAL HIGH (ref 0.7–3.1)
Lymphs: 25 %
MCH: 29.8 pg (ref 26.6–33.0)
MCHC: 32.9 g/dL (ref 31.5–35.7)
MCV: 91 fL (ref 79–97)
Monocytes Absolute: 1.3 10*3/uL — ABNORMAL HIGH (ref 0.1–0.9)
Monocytes: 10 %
Neutrophils Absolute: 8.1 10*3/uL — ABNORMAL HIGH (ref 1.4–7.0)
Neutrophils: 60 %
Platelets: 431 10*3/uL (ref 150–450)
RBC: 4.2 x10E6/uL (ref 3.77–5.28)
RDW: 13.2 % (ref 11.7–15.4)
WBC: 13.1 10*3/uL — ABNORMAL HIGH (ref 3.4–10.8)

## 2022-07-18 LAB — C-REACTIVE PROTEIN: CRP: 5 mg/L (ref 0–10)

## 2022-07-18 LAB — URIC ACID: Uric Acid: 4.7 mg/dL (ref 3.0–7.2)

## 2022-07-18 LAB — SEDIMENTATION RATE: Sed Rate: 14 mm/hr (ref 0–40)

## 2022-07-18 MED ORDER — DOXYCYCLINE HYCLATE 100 MG PO TABS: 100.0000 mg | ORAL_TABLET | Freq: Two times a day (BID) | ORAL | 0 refills | Status: DC

## 2022-07-18 MED ORDER — LEVOFLOXACIN 750 MG PO TABS: 750.0000 mg | ORAL_TABLET | Freq: Every day | ORAL | 0 refills | Status: DC

## 2022-07-18 NOTE — Progress Notes (Signed)
  Subjective:  Patient ID: [name], female    DOB: [birth year],  MRN: [id number]  Chief Complaint  Patient presents with   Routine Post Op    Post op #2- DOS 06/29/22 Fusion of joint in big right foot- patient stated her toes are swollen and it feels tight.     66 y.o. female returns for post-op check.  Still feels very swollen and tight she feels like is gotten worse  Review of Systems: Negative except as noted in the HPI. Denies N/V/F/Ch.   Objective:  There were no vitals filed for this visit. There is no height or weight on file to calculate BMI. Constitutional Well developed. Well nourished.  Vascular Foot warm and well perfused. Capillary refill normal to all digits.  Calf is soft and supple, no posterior calf or knee pain, negative Homans' sign  Neurologic Normal speech. Oriented to person, place, and time. Epicritic sensation to light touch grossly present bilaterally.  Dermatologic Today the incision is well-healed.  There is no active drainage.  Toe is swollen but there is no cellulitis  Orthopedic: Tenderness to palpation noted about the surgical site.  Moderate edema   Multiple view plain film radiographs: Loss of correction with diastases at the fusion site and pull-through of screws  Results  Collected Updated Procedure   07/17/2022 1614 07/18/2022 0738 Uric Acid [[id number]]   Blood   Component Value Units  Uric Acid 4.7  mg/dL       07/17/2022 1614 07/18/2022 0738 C-reactive protein [[id number]]   Blood   Component Value Units  CRP 5 mg/L       07/17/2022 1614 07/18/2022 0738 Sedimentation Rate [[id number]]   Blood   Component Value Units  Sed Rate 14 mm/hr       07/17/2022 1614 07/18/2022 0738 CBC with Differential [[id number]]   (Abnormal)  Blood   Component Value Units  WBC 13.1 High  x10E3/uL  RBC 4.20 x10E6/uL  Hemoglobin 12.5 g/dL  Hematocrit 38.0 %  MCV 91 fL  MCH 29.8 pg  MCHC 32.9 g/dL  RDW 13.2 %  Platelets 431 x10E3/uL   Neutrophils 60 %  Lymphs 25 %  Monocytes 10 %  Eos 3 %  Basos 1 %  Neutrophils Absolute 8.1 High  x10E3/uL  Lymphocytes Absolute 3.2 High  x10E3/uL  Monocytes Absolute 1.3 High  x10E3/uL  EOS (ABSOLUTE) 0.3 x10E3/uL  Basophils Absolute 0.1 x10E3/uL  Immature Granulocytes 1 %  Immature Grans (Abs) 0.1 x10E3/uL      Assessment:   1. Hammertoe of right foot   2. Gouty arthritis of toe of right foot   3. Deep postoperative wound infection    Plan:  Patient was evaluated and treated and all questions answered.  S/p foot surgery right -Unfortunate infection seems to have worsened and may be at risk of a deep postoperative infection.  Other etiology would include Charcot development of the IPJ but this is less likely.  I did order lab work and she did have an increased WBC with neutrophilia.  Suspect due to postoperative infection.  Referral placed for infectious disease.  I prescribed her her doxycycline and levofloxacin.  We will have her back for aspiration of the joint space, she may require I&D.  No follow-ups on file.

## 2022-07-19 ENCOUNTER — Encounter: Payer: Medicare Other | Admitting: Podiatry

## 2022-07-19 ENCOUNTER — Ambulatory Visit (INDEPENDENT_AMBULATORY_CARE_PROVIDER_SITE_OTHER): Payer: Medicare Other | Admitting: Podiatry

## 2022-07-19 DIAGNOSIS — T8142XA Infection following a procedure, deep incisional surgical site, initial encounter: Secondary | ICD-10-CM

## 2022-07-19 DIAGNOSIS — M8000XA Age-related osteoporosis with current pathological fracture, unspecified site, initial encounter for fracture: Secondary | ICD-10-CM | POA: Diagnosis not present

## 2022-07-19 DIAGNOSIS — M1A09X0 Idiopathic chronic gout, multiple sites, without tophus (tophi): Secondary | ICD-10-CM | POA: Diagnosis not present

## 2022-07-19 DIAGNOSIS — Z79899 Other long term (current) drug therapy: Secondary | ICD-10-CM | POA: Diagnosis not present

## 2022-07-19 NOTE — Progress Notes (Signed)
Patient returns today for fluid aspiration of surgical site at my request, risk and benefits of the procedure were discussed prior to the procedure.  A local field block was made with 1.5 cc each of 2% lidocaine and 0.5% Marcaine plain.  It was prepped and draped with Betadine.  Utilizing a syringe and sterile 18-gauge hypodermic needle the arthrodesis site was penetrated and aspirated.  No purulence was found.  A small amount of yellow bloody fluid was obtained.  This was sent for joint culture.  Sterile dressing was applied and post care instructions given.  She tolerated the procedure well.

## 2022-07-20 LAB — CBC WITH DIFFERENTIAL/PLATELET
Absolute Monocytes: 1080 cells/uL — ABNORMAL HIGH (ref 200–950)
Basophils Absolute: 97 cells/uL (ref 0–200)
Basophils Relative: 0.9 %
Eosinophils Absolute: 270 cells/uL (ref 15–500)
Eosinophils Relative: 2.5 %
HCT: 39 % (ref 35.0–45.0)
Hemoglobin: 13 g/dL (ref 11.7–15.5)
Lymphs Abs: 2419 cells/uL (ref 850–3900)
MCH: 29.9 pg (ref 27.0–33.0)
MCHC: 33.3 g/dL (ref 32.0–36.0)
MCV: 89.7 fL (ref 80.0–100.0)
MPV: 10 fL (ref 7.5–12.5)
Monocytes Relative: 10 %
Neutro Abs: 6934 cells/uL (ref 1500–7800)
Neutrophils Relative %: 64.2 %
Platelets: 400 10*3/uL (ref 140–400)
RBC: 4.35 10*6/uL (ref 3.80–5.10)
RDW: 12.8 % (ref 11.0–15.0)
Total Lymphocyte: 22.4 %
WBC: 10.8 10*3/uL (ref 3.8–10.8)

## 2022-07-20 LAB — COMPLETE METABOLIC PANEL WITH GFR
AG Ratio: 1.3 (calc) (ref 1.0–2.5)
ALT: 30 U/L — ABNORMAL HIGH (ref 6–29)
AST: 26 U/L (ref 10–35)
Albumin: 3.6 g/dL (ref 3.6–5.1)
Alkaline phosphatase (APISO): 138 U/L (ref 37–153)
BUN: 23 mg/dL (ref 7–25)
CO2: 31 mmol/L (ref 20–32)
Calcium: 9 mg/dL (ref 8.6–10.4)
Chloride: 103 mmol/L (ref 98–110)
Creat: 0.96 mg/dL (ref 0.50–1.05)
Globulin: 2.7 g/dL (calc) (ref 1.9–3.7)
Glucose, Bld: 101 mg/dL — ABNORMAL HIGH (ref 65–99)
Potassium: 4.5 mmol/L (ref 3.5–5.3)
Sodium: 143 mmol/L (ref 135–146)
Total Bilirubin: 0.2 mg/dL (ref 0.2–1.2)
Total Protein: 6.3 g/dL (ref 6.1–8.1)
eGFR: 66 mL/min/{1.73_m2} (ref >=60)

## 2022-07-20 LAB — VITAMIN D 25 HYDROXY (VIT D DEFICIENCY, FRACTURES): Vit D, 25-Hydroxy: 48 ng/mL (ref 30–100)

## 2022-07-20 LAB — URIC ACID: Uric Acid, Serum: 4.9 mg/dL (ref 2.5–7.0)

## 2022-07-20 NOTE — Progress Notes (Signed)
CBC is normal.  CMP shows mildly elevated liver function which is better than before.  Vitamin D is within normal limits.  Uric acid is in desirable range.

## 2022-07-23 LAB — WOUND CULTURE
MICRO NUMBER:: 13829967
RESULT:: NO GROWTH
SPECIMEN QUALITY:: ADEQUATE

## 2022-07-23 MED ORDER — HYDROCODONE-ACETAMINOPHEN 5-325 MG PO TABS: 1.0000 | ORAL_TABLET | Freq: Four times a day (QID) | ORAL | 0 refills | Status: AC | PRN

## 2022-07-24 ENCOUNTER — Encounter: Payer: Self-pay | Admitting: Internal Medicine

## 2022-07-24 ENCOUNTER — Ambulatory Visit: Payer: Medicare Other | Admitting: Internal Medicine

## 2022-07-24 ENCOUNTER — Other Ambulatory Visit: Payer: Self-pay

## 2022-07-24 VITALS — BP 119/80 | HR 75 | Temp 97.9°F | Ht 62.0 in | Wt 188.0 lb

## 2022-07-24 DIAGNOSIS — G629 Polyneuropathy, unspecified: Secondary | ICD-10-CM

## 2022-07-24 DIAGNOSIS — M1990 Unspecified osteoarthritis, unspecified site: Secondary | ICD-10-CM

## 2022-07-24 MED ORDER — CEFADROXIL 500 MG PO CAPS: 1000.0000 mg | ORAL_CAPSULE | Freq: Two times a day (BID) | ORAL | 1 refills | Status: DC

## 2022-07-24 NOTE — Patient Instructions (Signed)
You showed me pictures of the toe post surgery, and there was just mild duskiness and swelling without true erythema/purulence/wound dehiscence, NOT suggestive of infection. The inflammatory labs crp and esr on 8/22 were stone cold normal would be extremely unusual even with 7-10 days with antibiotics.   The xray finding though is concerning but can't alone by itself suggest infection. Furthermore, you already have ongoing erosion/sclerosis process in this joint much longer than before the screws were placed. I query if this process somehow reactivates when the screws were placed. You would benefit from rheumatology input. Further more you also had history of gout which can affect the joint   I gave you options to get off abx and monitor and perform other diagnostics evaluation and rheumatology evaluation vs continuing antibiotics with also the above, and you opt for the latter options. As I am 70-30 on this with 70% not suspecting an infectious process, I would rather have you take a non-toxic antibiotics. As mrsa/pseudmonas never grew and her culture was sterilized on cephalexin in the past, I will have you take cefadroxil alone. Will plan for 6-12 weeks depending on what your toes will look like

## 2022-07-24 NOTE — Progress Notes (Signed)
[facility]    Date of Admission:  (Not on file)     Reason for Consult: surgical site infection/hardware in place     Referring Provider: [name]   Abx: 8/23-c Doxy 8/23-c levoflox        Assessment: 66 yo female with polyarticular gout (sees rheum), bilateral feet numbness/neuropathy in feet unclear causes, OA bilateral hands, hips, s/p total knee arthroplasty, fibromyalgia, chronic fatigue, obesity, hx gastric bypass roux-en-Y 2020, s/p right big toe arthrodesis for IP joint instability from gout, now concerning for infection  8/04 right big toe ipj arthrodesis with 2 screws 8/10 podiatry visit concern for post-op surgical site infection given cephalexin 8/24 wound cx negative  I reviewed serial xray of the foot in august. There seems to be fractures of the distal phalanx of the right big toe at 2 places and more erosions with ?hardware loosening  He reports no pus but clinically appear to be a smoldering infectious process. Usually with early post-op infection would suggest staph aureus or strep or gram negative and would expect more purulence appearance with culture still high chance of growing.   She showed me pictures of the toe post op, and there was just mild duskiness and swelling without true erythema/purulence/wound dehiscence. The crp and esr on 8/22 were stone cold normal would be extremely unusual even with 7-10 days with antibiotics.   The xray finding though is concerning but can't alone by itself suggest infection. Furthermore, patient has ongoing erosion/sclerosis process in this joint much longer than before the screws were placed. I query if this process somehow reactivates when the screws were placed. She would benefit from rheumatology input. Further more she also had hx of gout. She is on allopurinol     I gave her options to get off abx and monitor and perform other diagnostics evaluation and rheum evaluation vs  continue abx with also the above, she opts to stay on antibiotics. As I am 70-30 on this with 70% not suspecting an infectious process, I would rather have her take a non-toxic antibiotics. As mrsa never grew and her culture was sterilized on cephalexin in the past, I will have her on cefadroxil alone. Will plan for 6-12 weeks depending on what her toes will look like   She inquires if this was charcot. She does have neuropathy so it potentially could be    Plan: Stop doxy/levo Start cefadroxil 1 gram twice a day Advise patient to discuss with her rheumatologist regarding other noninfectious possibility of why her toe can look like this especially it was sclerotic/eroded before surgery (arthrodesis) and also her having chronic gout Repeat crp/esr today See me in 4 weeks Will copy dr Sherryle Lis this chart If this all comes to conclusion of it being charcot, will stop antibiotics early especially if no sign of infection and labs remain normal    I have spent a total of 65 minutes of face-to-face and non-face-to-face time, excluding clinical staff time, preparing to see patient, ordering tests and/or medications, and provide counseling the patient       ------------------------------------------------ Active Problems:   * No active hospital problems. *    HPI: [name] is a 66 y.o. female here from podiatry referral for concern post op toe infection  I reviewed chart  Patient has polyarticulra gout and is on allopurinol She has chronic pain and swelling in the right big toe  Due to pain  she had visits with podiatry dr Sherryle Lis and ended up with arthrodesis on 8/4.  On 8/10 there was some concern for infection. I did review pictures from her today and there was only mild duskiness around the incision with mild swelling but no redness/purulence/dehiscence  She was placed on cephalexin and then after 10 days had toe aspirated which was negative on culture. Her esr/crp aorund  time of aspiration was normal  She has no fever/chill   The pain is better since surgery but she said the pain is different from prior pain before surgery. The swelling remains but again no redness/fever/chill/purulence  She was started on doxy/levo a week prior to this visit   Family History  Problem Relation Age of Onset   Allergies Mother    Hypertension Mother    Heart disease Mother        possible valve problem - leaking valve   Macular degeneration Mother    Heart disease Father        pacemaker, CHF   Hypertension Father    Diabetes Father        borderline   Stroke Father 26   Kidney disease Father    Asthma Sister    Irritable bowel syndrome Sister    Allergies Sister    Heart disease Paternal Grandmother    Heart disease Paternal Grandfather    Cancer Maternal Aunt        leukemia   Cancer Maternal Aunt    Colon cancer Maternal Aunt        late 60's   Heart disease Maternal Grandmother    Heart disease Maternal Grandfather    CAD Neg Hx     Social History   Tobacco Use   Smoking status: Never    Passive exposure: Never   Smokeless tobacco: Never  Vaping Use   Vaping Use: Never used  Substance Use Topics   Alcohol use: No    Alcohol/week: 0.0 standard drinks of alcohol   Drug use: No    Allergies  Allergen Reactions   Erythromycin Nausea Only    Abdominal pain Other reaction(s): Unknown   Meperidine Hcl    Polyethyl Glyc-Propyl Glyc Pf    Dilaudid [Hydromorphone Hcl] Itching    Review of Systems: ROS All Other ROS was negative, except mentioned above   Past Medical History:  Diagnosis Date   Anemia    previously followed by Dr. Jamse Arn for anemia and elevated platelets   Anxiety    C. difficile colitis 10/01/2012   treated by [facility]   Chronic fatigue syndrome    Closed wedge compression fracture of T8 vertebra ([place]) 06/2021   DDD (degenerative disc disease), lumbar 08/19/2014   and facet arthroplasty & left lumbar radiculopathy  (Dr.Ramos)   Depression    Dyssynergia    dyssynergenic defecation, contributing to fecal incontinence.   Edema    Fibromyalgia    Gastroparesis    followed at [facility]   GERD (gastroesophageal reflux disease)    History of kidney stones    History of vertebral fracture 06/30/2021   Hyperlipidemia    Kidney stone    Lumbar radiculopathy    Migraine    Neuropathy    Obstructive sleep apnea    Does  wear  CPAP   Osteoporosis    Paresthesia    Dr. Everette Rank at [facility] [place] [facility]   Pelvic floor dysfunction    pelvic floor dyssynergy   Plantar fasciitis 02/2011   R foot  Pneumonia    2012   PONV (postoperative nausea and vomiting)    pt states has gastroparesis has difficulty taking antibiotics and narcotics has severe nausea and vomiting    Restless leg syndrome    S/P endometrial ablation 08/09/2006   Novasure Ablation   S/P epidural steroid injection 09/20/2014   Dr.Ramos   Tremor    Dr. Everette Rank   Urinary frequency    Urinary incontinence        Scheduled Meds: Continuous Infusions: PRN Meds:.   OBJECTIVE: Blood pressure 119/80, pulse 75, temperature 97.9 F (36.6 C), temperature source Oral, height _0  (1.575 m), weight 188 lb (85.3 kg), last menstrual period 07/27/2006, SpO2 97 %.  Physical Exam General/constitutional: no distress, pleasant HEENT: Normocephalic, PER, Conj Clear, EOMI, Oropharynx clear Neck supple CV: rrr no mrg Lungs: clear to auscultation, normal respiratory effort Abd: Soft, Nontender Ext: no edema Skin/msk -- brawny hypertrophic changes of soft tissue in the right big toe; no purulence or dehiscence -- some scabbing. No tenderness Neuro: nonfocal MSK: no peripheral joint swelling/tenderness/warmth; back spines nontender  8/29      Lab Results Lab Results  Component Value Date   WBC 10.8 07/19/2022   HGB 13.0 07/19/2022   HCT 39.0 07/19/2022   MCV 89.7 07/19/2022   PLT 400 07/19/2022    Lab Results  Component Value Date    CREATININE 0.96 07/19/2022   BUN 23 07/19/2022   NA 143 07/19/2022   K 4.5 07/19/2022   CL 103 07/19/2022   CO2 31 07/19/2022    Lab Results  Component Value Date   ALT 30 (H) 07/19/2022   AST 26 07/19/2022   ALKPHOS 268 (H) 03/12/2022   ALKPHOS 271 (H) 03/12/2022   BILITOT 0.2 07/19/2022      Microbiology: Recent Results (from the past 240 hour(s))  WOUND CULTURE     Status: None   Collection Time: 07/19/22 11:35 AM   Specimen: Abscess; Wound  Result Value Ref Range Status   MICRO NUMBER: [id number]  Final   SPECIMEN QUALITY: Adequate  Final   SOURCE: NOT GIVEN  Final   STATUS: FINAL  Final   GRAM STAIN:   Final    No white blood cells seen No epithelial cells seen No organisms seen   RESULT: No Growth  Final   COMMENT:   Final    No source was provided. The specimen was tested and reported based upon the test code ordered. If this is incorrect, please contact client services.     Serology:    Imaging: If present, new imagings (plain films, ct scans, and mri) have been personally visualized and interpreted; radiology reports have been reviewed. Decision making incorporated into the Impression / Recommendations.  8/22 and 8/10 xra right foot reviewed  Jabier Mutton, [place] [facility] [place] [phone] pager    07/24/2022, 2:41 PM

## 2022-07-25 ENCOUNTER — Encounter: Payer: Self-pay | Admitting: Family Medicine

## 2022-07-25 LAB — BASIC METABOLIC PANEL
BUN: 21 mg/dL (ref 7–25)
CO2: 31 mmol/L (ref 20–32)
Calcium: 10.4 mg/dL (ref 8.6–10.4)
Chloride: 102 mmol/L (ref 98–110)
Creat: 0.94 mg/dL (ref 0.50–1.05)
Glucose, Bld: 95 mg/dL (ref 65–99)
Potassium: 4.4 mmol/L (ref 3.5–5.3)
Sodium: 140 mmol/L (ref 135–146)

## 2022-07-25 LAB — C-REACTIVE PROTEIN: CRP: 3.4 mg/L (ref <8.0)

## 2022-07-25 LAB — SEDIMENTATION RATE: Sed Rate: 31 mm/h — ABNORMAL HIGH (ref 0–30)

## 2022-07-25 NOTE — Patient Instructions (Incomplete)
  HEALTH MAINTENANCE RECOMMENDATIONS:  It is recommended that you get at least 30 minutes of aerobic exercise at least 5 days/week (for weight loss, you may need as much as 60-90 minutes). This can be any activity that gets your heart rate up. This can be divided in 10-15 minute intervals if needed, but try and build up your endurance at least once a week.  Weight bearing exercise is also recommended twice weekly.  Eat a healthy diet with lots of vegetables, fruits and fiber.  "Colorful" foods have a lot of vitamins (ie green vegetables, tomatoes, red peppers, etc).  Limit sweet tea, regular sodas and alcoholic beverages, all of which has a lot of calories and sugar.  Up to 1 alcoholic drink daily may be beneficial for women (unless trying to lose weight, watch sugars).  Drink a lot of water.  Calcium recommendations are 1200-1500 mg daily (1500 mg for postmenopausal women or women without ovaries), and vitamin D 1000 IU daily.  This should be obtained from diet and/or supplements (vitamins), and calcium should not be taken all at once, but in divided doses.  Monthly self breast exams and yearly mammograms for women over the age of 21 is recommended.  Sunscreen of at least SPF 30 should be used on all sun-exposed parts of the skin when outside between the hours of 10 am and 4 pm (not just when at beach or pool, but even with exercise, golf, tennis, and yard work!)  Use a sunscreen that says "broad spectrum" so it covers both UVA and UVB rays, and make sure to reapply every 1-2 hours.  Remember to change the batteries in your smoke detectors when changing your clock times in the spring and fall. Carbon monoxide detectors are recommended for your home.  Use your seat belt every time you are in a car, and please drive safely and not be distracted with cell phones and texting while driving.   [name] , Thank you for taking time to come for your Medicare Wellness Visit. I appreciate your ongoing  commitment to your health goals. Please review the following plan we discussed and let me know if I can assist you in the future.   This is a list of the screening recommended for you and due dates:  Health Maintenance  Topic Date Due   Pneumonia Vaccine (1 - PCV) Never done   COVID-19 Vaccine (6 - Pfizer risk series) 04/02/2022   Flu Shot  06/26/2022   Colon Cancer Screening  12/04/2022   Mammogram  12/25/2022   Pap Smear  02/26/2025   Tetanus Vaccine  06/21/2027   DEXA scan (bone density measurement)  Completed   Hepatitis C Screening: USPSTF Recommendation to screen - Ages 71-79 yo.  Completed   HIV Screening  Completed   Zoster (Shingles) Vaccine  Completed   HPV Vaccine  Aged Out   Please get high dose flu shot in September. I recommend that you get the new RSV vaccine (you need to get this from the pharmacy, covered by Medicare part D). I recommend you get the newly updated COVID booster when it becomes available in the Fall. Vaccines should either be given the same day, or at least 2 weeks apart.

## 2022-07-25 NOTE — Progress Notes (Signed)
Chief Complaint  Patient presents with   Medicare Wellness    Nonfasting AWV/CPE no pap-sees GYN. She would like to know if you would look at her foot?     [name] is a 66 y.o. female who presents for annual physical exam, Medicare wellness visit and follow-up on chronic medical conditions.   She has the following concerns:  Patient underwent foot surgery with Dr. Sherryle Lis on 8/4 (fusion at R great toe).  This appeared to be complicated by infection. She was started on antibiotics, didn't seem to respond, and was referred to infectious disease for evaluation.  He was 70% sure that this wasn't an infectious process.  He switched her ABX from doxy/levo to cefadroxil. She was advised to f/u with her rheumatologist. They discussed possibility of charcot joint (in which case, ABX can be stopped). She had a virtual visit today and the antibiotics were stopped. Close f/u planned, as well as with rheum.  CRP/ESR was repeated by ID-- Lab Results  Component Value Date   ESRSEDRATE 31 (H) 07/24/2022   Lab Results  Component Value Date   CRP 3.4 07/24/2022     Pre-diabetes and obesity:  A1c was up to 6.3% in 2019.  It was  5.7% at her physical last year. She had Roux-En-Y on 09/29/2019.  She hasn't had f/u with the surgeons because, per pt, when they have called she has had other things going on. She used to go to the bariatric exercise program 3x/week, but none since kyphoplasty, and the toe ulcer. She had ridden her stationary bike a few times/week, but not in the last month.  Hyperlipidemia follow-up:  Patient is trying to follow a low-fat, low cholesterol diet as best she can.  Compliant with medications (atorvastatin 29m) and denies medication side effects. Lipids were at goal on last check in March.  She has had elevated LFT's, improved on last check earlier this month (ALT down to 30, normal AST, alkphos). Lab Results  Component Value Date   CHOL 117 02/05/2022   HDL 48 02/05/2022    LDLCALC 45 02/05/2022   TRIG 137 02/05/2022   CHOLHDL 2.4 02/05/2022     Hypertension: (never formally diagnosed, because had been on diuretics for fluid retention (stopped a while ago) and beta blockers for tachycardia and tremor--but high BP's noted when not taking these medications in past).   She continues on metoprolol 576mBID (originally prescribed by Dr. NiJohnsie Cancelfor tachycardia). Denies chest pain, palpitations/tachycardia, lightheadedness, edema. Occasional headache. BP Readings from Last 3 Encounters:  07/26/22 118/68  07/24/22 119/80  07/11/22 (!) 114/56      She is under the care of rheumatology clinic for fibromyalgia, osteoarthritis, degenerative disc disease, chronic fatigue, osteoporosis.    She continues on cymbalta, gabapentin (also for neuropathy in feet), and robaxin 50081mID prn. They prescribe her armodafinil for her chronic fatigue. She has been weaning off of this, using it only when "very zapped out".   She gets massages 1-2x/month (took a break related to her back, but has restarted, none since foot problems).  She has pain daily but reports it is manageable most of the time. Bothers her the most to stand still (washing dishes), hasn't been doing this since her foot surgery.   She had T12 and L1 compression fractures after a fall in 01/2020. She had worsening back pain in July/August 2022, and was found to have new, acute compression fractures. She ultimately had kyphoplasty done 10/2021 by Dr. NunKathyrn Sheriffhe  was started on Forteo injections by rheum in 07/2021. DEXA in 2018 was normal.  DEXA 06/2020 T-2.0 R fem neck, osteopenia; per rheum notes, planning to recheck in Fall 2023.     Gout--she continues on allopurinol 382m daily, and hasn't had any flares in several years. Her colchicine has expired, never really needed to take it. Lab Results  Component Value Date   LABURIC 4.9 07/19/2022    She has OSA, under the care of Dr. TRadford Pax She is on CPAP.  Energy is  improved, but still requires Nuvigil, though is using this much less often now (mainly cut back due to insurance issues, mainly related to her age). She feels more tired since she hasn't been able to exercise regularly.   She is under the care of Dr. TValaria Goodfor RLS, neuropathy (pedal paresthesias), tremor (benign essential vs accentuated physiologic tremor), migraines. Last seen in 03/2022. She takes gabapentin for restless legs as well as her neuropathy pain.  He was trying to switch her from nortriptyline to oxcarbazepine, for migraine prevention. The oxcarbazepine has helped her neuropathy, but she didn't tolerate the higher doses of oxcarbazepine, so unable to get off pamelor.  RLS is well controlled most of the time. Her headaches are much better--denies migraines, getting temporal headaches about once a week, some sinus headaches.   Depression:  [facility] continues on Cymbalta and Abilify. She reports moods aren't as good since she hasn't been able to exercise, energy is worse.  Upset with herself about weight gain, feels like a failure.  She also worries about her foot, getting lots of different information from the different doctors.   GERD and h/o gastroparesis: Protonix was cut back to once daily after her Roux-en-Y.  Only occasionally has nausea and vomiting, gastroparesis is much better.  Uses zofran about once a week for nausea.   Immunization History  Administered Date(s) Administered   Influenza Inj Mdck Quad Pf 09/13/2019   Influenza Split 09/17/2011, 09/03/2012, 09/04/2013   Influenza,inj,Quad PF,6+ Mos 08/16/2014, 07/27/2015, 07/31/2017, 08/13/2018, 07/28/2020   Influenza-Unspecified 10/04/2016, 07/31/2017, 09/23/2017, 09/18/2021   PFIZER Comirnaty(Gray Top)Covid-19 Tri-Sucrose Vaccine 06/01/2021   PFIZER(Purple Top)SARS-COV-2 Vaccination 02/20/2020, 03/12/2020, 09/20/2020   PNEUMOCOCCAL CONJUGATE-20 07/26/2022   Pfizer Covid-19 Vaccine Bivalent Booster 133yr & up 02/05/2022   Tdap 01/16/2011, 06/20/2017   Zoster Recombinat (Shingrix) 02/05/2019, 07/14/2019   Last Pap smear: 02/2022. Normal, but showed atrophy. Started on an estrogen cream, to repeat in a year per GYN Last mammogram: 11/2021 [facility] [facility] T-2.0 R fem neck (prior was 06/2017, normal) Dentist: twice yearly Ophtho: yearly Exercise:  None since her foot surgery.  Had been using her exercise bike 4-5 days/week prior to that.  Hasn't done bariatric exercise class related to her back pain.  Vitamin D-OH 48 in 06/2022 Thyroid: Lab Results  Component Value Date   TSH 2.406 01/22/2021   Patient Care Team: KnRita OharaMD as PCP - General (Family Medicine) NiJosue HectorMD as PCP - Cardiology (Cardiology) TuSueanne MargaritaMD as PCP - Sleep Medicine (Cardiology) PrViona GilmoreRPForest Canyon [facility] Pharmacist (Pharmacist) GYN: TiMarny LowensteinNP Neuro: Dr. ToEverette RankSleep med/OSA: Dr. TrFransico Himhas previously seen Dr. DoBrett Fairyor OSA in past) Rheum: Dr. DeEstanislado Pandysych: ReRollene Farepreviously Dr. McCaprice BeaverGI: Dr. KoDerrill KayDr. FiRoney Mansodiatry: Dr. McLajoyce Lauberat [facility]: Dr. MuDeniece Reerologist: Dr. WrJeffie Pollockulmonologist: Dr. NaLenna GilfordSoHalford Chessmanrthopedist: Aluisio, Bean Derm: Dr. LoUbaldo Glassingariatric surgeon: Dr. MaHassell DoneD:  Dr. Gale Journey  Depression Screening:    07/26/2022    2:41 PM 07/26/2022    9:13 AM 07/24/2022    2:28 PM 01/17/2022   10:07 AM 06/28/2021   10:03 AM  Depression screen PHQ 2/9  Decreased Interest 1 0 0 0 1  Down, Depressed, Hopeless 1 1 0 0 0  PHQ - 2 Score 2 1 0 0 1  Altered sleeping 3   0 2  Tired, decreased energy 2   0 1  Change in appetite 0   0 1  Feeling bad or failure about yourself  0   1 1  Trouble concentrating 1   0 0  Moving slowly or fidgety/restless 1   0 1  Suicidal thoughts 0   0 0  PHQ-9 Score '9   1 7  ' Difficult doing work/chores Somewhat difficult   Not difficult at all Somewhat difficult    Falls screen:      07/26/2022    2:22 PM 07/26/2022    9:13 AM 07/24/2022    2:28 PM 02/05/2022   11:11 AM 01/17/2022   10:06 AM  Fall Risk   Falls in the past year? '1 1 1 1 1  ' Number falls in past yr: 0 1 0 1 1  Comment 12/31/21 ER visit fell out of bed   june last year, fx vertebrae   Injury with Fall? '1 1 1 1 1  ' Comment hematoma L leg Hematoma  hematoma of left leg-seen in ER 12/31/21   Risk for fall due to :  History of fall(s) History of fall(s);Impaired balance/gait  Impaired balance/gait  Follow up  Falls evaluation completed Falls evaluation completed Falls evaluation completed Falls evaluation completed     Functional Status Survey: Is the patient deaf or have difficulty hearing?: No Does the patient have difficulty seeing, even when wearing glasses/contacts?: No Does the patient have difficulty concentrating, remembering, or making decisions?: Yes (some memory issues) Does the patient have difficulty walking or climbing stairs?: Yes (knee pain and current toe pain) Does the patient have difficulty dressing or bathing?: No Does the patient have difficulty doing errands alone such as visiting a doctor's office or shopping?: Yes (not driving at the moment)  Mini-Cog Scoring: 5   She has advanced directives (living will, healthcare power of attorney). Scanned in chart in 05/2019.  PMH, PSH, SH and FH were updated and reviewed.  Outpatient Encounter Medications as of 07/26/2022  Medication Sig Note   allopurinol (ZYLOPRIM) 300 MG tablet Take 1 tablet (300 mg total) by mouth daily.    ALPRAZolam (XANAX) 0.25 MG tablet Take 1 tablet (0.25 mg total) by mouth 3 (three) times daily as needed for anxiety.    ARIPiprazole (ABILIFY) 5 MG tablet Take 1 tablet (5 mg total) by mouth daily.    aspirin 81 MG chewable tablet     atorvastatin (LIPITOR) 40 MG tablet Take 1 tablet (40 mg total) by mouth daily.    CALCIUM PO Take 500 mg by mouth in the morning, at noon, and at bedtime.    cetirizine (ZYRTEC) 10 MG  tablet Take 10 mg by mouth at bedtime.    DULoxetine (CYMBALTA) 60 MG capsule Take 1 capsule (60 mg total) by mouth 2 (two) times daily.    estradiol (ESTRACE VAGINAL) 0.1 MG/GM vaginal cream Place 1 g vaginally 2 (two) times a week.    fluticasone (FLONASE) 50 MCG/ACT nasal spray PLACE 2 SPRAYS INTO BOTH NOSTRILS DAILY AS NEEDED FOR ALLERGIES  FORTEO 600 MCG/2.4ML SOPN INJECT 20MCG UNDER THE SKIN ONCE DAILY    gabapentin (NEURONTIN) 300 MG capsule Take 300 mg by mouth 3 (three) times daily.    Insulin Pen Needle 31G X 5 MM MISC Use to inject Forteo once daily. DISCARD AFTER USE.    melatonin 5 MG TABS Take 5 mg by mouth at bedtime.     metoprolol tartrate (LOPRESSOR) 50 MG tablet TAKE 1 TABLET BY MOUTH TWICE A DAY    Multiple Vitamins-Minerals (BARIATRIC MULTIVITAMINS/IRON PO) Take 2 each by mouth daily.    Multiple Vitamins-Minerals (ICAPS AREDS 2) CAPS Take 1 capsule by mouth 2 (two) times daily.    nortriptyline (PAMELOR) 50 MG capsule Take 100 mg by mouth at bedtime.    OXcarbazepine (TRILEPTAL) 150 MG tablet Take 150 mg by mouth 2 (two) times daily.    pantoprazole (PROTONIX) 40 MG tablet Take 1 tablet (40 mg total) by mouth daily.    Probiotic Product (PROBIOTIC ADVANCED PO) Take by mouth.    RESTASIS 0.05 % ophthalmic emulsion 1 drop 2 (two) times daily.    acetaminophen (TYLENOL) 500 MG tablet Take 1,000 mg by mouth every 8 (eight) hours as needed for moderate pain. (Patient not taking: Reported on 07/26/2022) 07/26/2022: prn   albuterol (VENTOLIN HFA) 108 (90 Base) MCG/ACT inhaler TAKE 2 PUFFS BY MOUTH EVERY 6 HOURS AS NEEDED FOR WHEEZE OR SHORTNESS OF BREATH (Patient not taking: Reported on 07/26/2022) 07/26/2022: prn   Armodafinil 250 MG tablet Take 1 tablet by mouth daily with breakfast. (Patient not taking: Reported on 07/26/2022) 07/26/2022: Did not take today-taking as needed   cefadroxil (DURICEF) 500 MG capsule Take 2 capsules (1,000 mg total) by mouth 2 (two) times daily. (Patient  not taking: Reported on 07/26/2022) 07/26/2022: Stopped today   colchicine 0.6 MG tablet Take 1.66m (2 tablets) then 0.626m(1 tablet) 1 hour after. Then, take 1 tablet every day for 7 days. (Patient not taking: Reported on 07/26/2022) 02/05/2022: As needed   HYDROcodone-acetaminophen (NORCO/VICODIN) 5-325 MG tablet Take 1 tablet by mouth every 6 (six) hours as needed for up to 5 days. (Patient not taking: Reported on 07/26/2022) 07/26/2022: Last dose last night   meclizine (ANTIVERT) 25 MG tablet Take 1 tablet (25 mg total) by mouth 3 (three) times daily as needed for dizziness. (Patient not taking: Reported on 07/26/2022) 02/05/2022: As needed   methocarbamol (ROBAXIN) 500 MG tablet TAKE 1 TABLET BY MOUTH EVERY DAY AS NEEDED (Patient not taking: Reported on 07/26/2022) 07/26/2022: prn   mupirocin ointment (BACTROBAN) 2 % Apply 1 application  topically 2 (two) times daily. (Patient not taking: Reported on 07/26/2022)    ondansetron (ZOFRAN) 8 MG tablet Take 8 mg by mouth every 8 (eight) hours as needed for nausea. (Patient not taking: Reported on 07/26/2022) 05/02/2020: Uses prn, very rarely   promethazine (PHENERGAN) 25 MG tablet Take 25 mg by mouth as needed. (Patient not taking: Reported on 07/26/2022) 02/05/2022: As needed   [DISCONTINUED] Armodafinil 250 MG tablet Take 1 tablet by mouth daily with breakfast.    [DISCONTINUED] benzonatate (TESSALON) 200 MG capsule Take 1 capsule (200 mg total) by mouth 3 (three) times daily as needed for cough. (Patient not taking: Reported on 05/01/2022)    [DISCONTINUED] DM-Phenylephrine-Acetaminophen (ALKA-SELTZER PLS SINUS & COUGH PO) Take by mouth. (Patient not taking: Reported on 06/06/2022)    [DISCONTINUED] doxycycline (VIBRA-TABS) 100 MG tablet Take 1 tablet (100 mg total) by mouth 2 (two) times daily. (Patient not taking: Reported on  07/26/2022)    [DISCONTINUED] levofloxacin (LEVAQUIN) 750 MG tablet Take 1 tablet (750 mg total) by mouth daily. (Patient not taking: Reported on  07/26/2022)    [DISCONTINUED] methylPREDNISolone (MEDROL DOSEPAK) 4 MG TBPK tablet 6 day dose pack - take as directed (Patient not taking: Reported on 05/01/2022)    No facility-administered encounter medications on file as of 07/26/2022.   Allergies  Allergen Reactions   Erythromycin Nausea Only    Abdominal pain Other reaction(s): Unknown   Meperidine Hcl    Polyethyl Glyc-Propyl Glyc Pf    Dilaudid [Hydromorphone Hcl] Itching    ROS: The patient denies fever, vision changes, decreased hearing, breast concerns, palpitations, dizziness, syncope, cough, swelling, bowel changes (goes back and forth with diarrhea and constipation, per her usual; diarrhea isn't as often as it used to be), no abdominal pain, melena, hematochezia, indigestion/heartburn, hematuria, dysuria, vaginal bleeding, discharge, odor or itch, genital lesions, weakness, suspicious skin lesions, abnormal bleeding/bruising, or enlarged lymph nodes. Rare shortness of breath with stairs--got worse since she regained some weight. Fibromyalgia unchanged, manageable. Moods are worse since gained weight gain.   No gout flares. Chronic urinary leakage, stable. Upper back pain, daily, manageable. Neuropathy in LE's--burning in feet, chronic. Occasional headaches, overall improved, no migraines. R foot/toe pain per HPI. Last week her R heel started hurting again. She has some itching in her forearms--feels a little rough.  Comes/goes, sporadic. She has dry skin. Some mild memory issues--"said something stupid"--didn't have to do with what they were talking about. Infrequent. She states Dr. Valaria Good evaluated her memory and was fine.   PHYSICAL EXAM:  BP 118/68   Pulse 80   Ht 5' 0.5" (1.537 m)   Wt 189 lb 9.6 oz (86 kg)   LMP 07/27/2006   BMI 36.42 kg/m   Wt Readings from Last 3 Encounters:  07/26/22 189 lb 9.6 oz (86 kg)  07/24/22 188 lb (85.3 kg)  06/06/22 186 lb 9.6 oz (84.6 kg)  06/2021 wt 181#  General Appearance:     Alert, cooperative, no distress, appears stated age  Head:    Normocephalic, without obvious abnormality, atraumatic  Eyes:    PERRL, conjunctiva/corneas clear, EOM's intact, fundi    benign  Ears:    Normal TM's and external ear canals  Nose:   Normal, no drainage or sinus tenderness  Throat:  Normal no lesions  Neck:   Supple, no lymphadenopathy;  thyroid:  no enlargement/ tenderness/nodules; no carotid bruit or JVD  Back:    Spine nontender, no curvature, ROM normal, no CVA tenderness. Mild tenderness at R trapezius, though no palpable spasm.  Lungs:     Clear to auscultation bilaterally without wheezes, rales or  ronchi; respirations unlabored  Chest Wall:    No tenderness or deformity.    Heart:    Regular rate and rhythm, S1 and S2 normal, no murmur, rub or gallop  Breast Exam:    Deferred to GYN  Abdomen:     Soft, obese, nondistended, normoactive bowel sounds, no masses, no hepatosplenomegaly.  Minimally tender in epigastrium.  Genitalia:    Deferred to GYN       Extremities:   No clubbing, cyanosis or edema on left. Right foot--whole foot is somewhat swollen. The great toe is significantly swollen/enlarged, with a few different areas of scabs and scaly patches on this toe. 2nd toe is straight (since tendon clipped per pt), just slightly pink. Nontender. There is a small scab at the anterior ankle (from boot rubbing?)  No crusting, nontender, no surrounding erythema.  There is a firm area at L medial calf (prior trauma/hematoma), nontender  Pulses:   2+ and symmetric all extremities  Skin:   Skin color, texture, turgor normal, no rashes or lesions  Lymph nodes:   Cervical, supraclavicular nodes normal  Neurologic:   Normal strength, sensation and gait; reflexes 2+ and symmetric throughout. No significant tremor noted                             Psych:   Somewhat depressed mood, full range of affect, normal hygiene and grooming   Lab Results  Component Value Date   HGBA1C 5.9 (A)  07/26/2022     ASSESSMENT/PLAN:  Annual physical exam  Medicare annual wellness visit, subsequent  Mixed hyperlipidemia - at goal on current regimen per last check in March. Continue current meds, lowfat, low cholesterol diet  History of Roux-en-Y gastric bypass  Fibromyalgia - stable, under care of rheum  OSA (obstructive sleep apnea) - compliant with CPAP  Restless leg syndrome - controlled with current regimen  (per neuro)  Idiopathic peripheral neuropathy - controlled with current regimen (per neuro)  Migraine without status migrainosus, not intractable, unspecified migraine type - controlled with current regimen (per neuro); has some non-migrainous HA's  Impaired fasting glucose - reviewed proper diet, encouraged exercise when cleared, encouraged to cont to work on wt loss - Plan: HgB A1c  Gastroesophageal reflux disease without esophagitis - controlled with PPI  Acute gout of right foot, unspecified cause - normal uric acid level, on allopurinol  Osteoporosis, unspecified osteoporosis type, unspecified pathological fracture presence - cont Forteo injections. Discussed Ca, D, weight-bearing exercise.  Need for pneumococcal 20-valent conjugate vaccination - Plan: Pneumococcal conjugate vaccine 20-valent (Prevnar 20)  Discussed monthly self breast exams and yearly mammograms; at least 30 minutes of aerobic activity at least 5 days/week, weight-bearing exercise at least 2x/wk; proper sunscreen use reviewed; healthy diet, including goals of calcium and vitamin D intake and alcohol recommendations (less than or equal to 1 drink/day) reviewed; regular seatbelt use; changing batteries in smoke detectors, carbon monoxide detectors. Immunization recommendations discussed--continue yearly high dose flu shots in the Fall. updated COVID booster when it is available in the Fall.  RSV vaccine recommended from pharmacy.  [id number] given today. Colonoscopy is UTD per her GI, due again  11/2022.  Full Code, Full Care MOST form reviewed/updated   Medicare Attestation I have personally reviewed: The patient's medical and social history Their use of alcohol, tobacco or illicit drugs Their current medications and supplements The patient's functional ability including ADLs,fall risks, home safety risks, cognitive, and hearing and visual impairment Diet and physical activities Evidence for depression or mood disorders  The patient's weight, height, BMI have been recorded in the chart.  I have made referrals, counseling, and provided education to the patient based on review of the above and I have provided the patient with a written personalized care plan for preventive services.

## 2022-07-26 ENCOUNTER — Telehealth: Payer: Self-pay | Admitting: Rheumatology

## 2022-07-26 ENCOUNTER — Other Ambulatory Visit (HOSPITAL_COMMUNITY): Payer: Self-pay

## 2022-07-26 ENCOUNTER — Ambulatory Visit (INDEPENDENT_AMBULATORY_CARE_PROVIDER_SITE_OTHER): Payer: Medicare Other | Admitting: Family Medicine

## 2022-07-26 ENCOUNTER — Telehealth (INDEPENDENT_AMBULATORY_CARE_PROVIDER_SITE_OTHER): Payer: Medicare Other | Admitting: Internal Medicine

## 2022-07-26 ENCOUNTER — Other Ambulatory Visit: Payer: Self-pay | Admitting: Rheumatology

## 2022-07-26 ENCOUNTER — Other Ambulatory Visit: Payer: Self-pay

## 2022-07-26 ENCOUNTER — Encounter: Payer: Self-pay | Admitting: Family Medicine

## 2022-07-26 ENCOUNTER — Encounter: Payer: Self-pay | Admitting: Internal Medicine

## 2022-07-26 VITALS — BP 118/68 | HR 80 | Ht 60.5 in | Wt 189.6 lb

## 2022-07-26 DIAGNOSIS — M109 Gout, unspecified: Secondary | ICD-10-CM

## 2022-07-26 DIAGNOSIS — G2581 Restless legs syndrome: Secondary | ICD-10-CM

## 2022-07-26 DIAGNOSIS — E782 Mixed hyperlipidemia: Secondary | ICD-10-CM

## 2022-07-26 DIAGNOSIS — G4733 Obstructive sleep apnea (adult) (pediatric): Secondary | ICD-10-CM | POA: Diagnosis not present

## 2022-07-26 DIAGNOSIS — R7301 Impaired fasting glucose: Secondary | ICD-10-CM

## 2022-07-26 DIAGNOSIS — Z23 Encounter for immunization: Secondary | ICD-10-CM

## 2022-07-26 DIAGNOSIS — M810 Age-related osteoporosis without current pathological fracture: Secondary | ICD-10-CM

## 2022-07-26 DIAGNOSIS — G629 Polyneuropathy, unspecified: Secondary | ICD-10-CM

## 2022-07-26 DIAGNOSIS — G43909 Migraine, unspecified, not intractable, without status migrainosus: Secondary | ICD-10-CM | POA: Diagnosis not present

## 2022-07-26 DIAGNOSIS — R937 Abnormal findings on diagnostic imaging of other parts of musculoskeletal system: Secondary | ICD-10-CM | POA: Diagnosis not present

## 2022-07-26 DIAGNOSIS — Z9884 Bariatric surgery status: Secondary | ICD-10-CM

## 2022-07-26 DIAGNOSIS — Z0000 Encounter for general adult medical examination without abnormal findings: Secondary | ICD-10-CM | POA: Diagnosis not present

## 2022-07-26 DIAGNOSIS — G609 Hereditary and idiopathic neuropathy, unspecified: Secondary | ICD-10-CM

## 2022-07-26 DIAGNOSIS — K219 Gastro-esophageal reflux disease without esophagitis: Secondary | ICD-10-CM

## 2022-07-26 DIAGNOSIS — M797 Fibromyalgia: Secondary | ICD-10-CM

## 2022-07-26 LAB — POCT GLYCOSYLATED HEMOGLOBIN (HGB A1C): Hemoglobin A1C: 5.9 % — AB (ref 4.0–5.6)

## 2022-07-26 MED ORDER — ARMODAFINIL 250 MG PO TABS
ORAL_TABLET | ORAL | 0 refills | Status: DC
  Filled 2022-07-26: qty 30, 30d supply, fill #0

## 2022-07-26 NOTE — Telephone Encounter (Signed)
I returned patient call and discussed that I have reviewed the notes from her infectious disease doctor and the podiatrist.  We will discuss it further at the follow-up visit.

## 2022-07-26 NOTE — Telephone Encounter (Signed)
Next Visit: 12/10/2022   Last Visit: 06/06/2022   Last Fill: 06/12/2022   Dx: Other fatigue    Current Dose per office note on 06/06/2022:  armodafinil 250 mg daily with breakfast for many years.  I had a detailed discussion with patient regarding tapering off armodafinil as some she is not working now.  Tapering off the medication over the next 2 months was advised.  We will refill it for the next 2 months.  We will stop providing refills on armodafinil in September 2023.    Okay to refill Armodafinil?

## 2022-07-26 NOTE — Telephone Encounter (Signed)
I called patient.  She states this will be her last month for Nuvigil.  She will discontinue after this month.  I will refill the medication for September.

## 2022-07-26 NOTE — Telephone Encounter (Signed)
Patient has seen Podiatry and ID. ID would is questioning non-infectious inflammatory condition such as charcot present? Does patient need an appointment? She has a follow up scheduled for 08/01/2022.

## 2022-07-26 NOTE — Progress Notes (Signed)
[name] for Infectious Disease    Date of Admission:  (Not on file)     Reason for Consult: surgical site infection/hardware in place     Referring Provider: [name]   Abx: 8/23-c Doxy 8/23-c levoflox        Assessment: 66 yo female with polyarticular gout (sees rheum), bilateral feet numbness/neuropathy in feet unclear causes, OA bilateral hands, hips, s/p total knee arthroplasty, fibromyalgia, chronic fatigue, obesity, hx gastric bypass roux-en-Y 2020, s/p right big toe arthrodesis for IP joint instability from gout, now concerning for infection  8/04 right big toe ipj arthrodesis with 2 screws 8/10 podiatry visit concern for post-op surgical site infection given cephalexin 8/24 wound cx negative  I reviewed serial xray of the foot in august. There seems to be fractures of the distal phalanx of the right big toe at 2 places and more erosions with ?hardware loosening  He reports no pus but clinically appear to be a smoldering infectious process. Usually with early post-op infection would suggest staph aureus or strep or gram negative and would expect more purulence appearance with culture still high chance of growing.   She showed me pictures of the toe post op, and there was just mild duskiness and swelling without true erythema/purulence/wound dehiscence. The crp and esr on 8/22 were stone cold normal would be extremely unusual even with 7-10 days with antibiotics.   The xray finding though is concerning but can't alone by itself suggest infection. Furthermore, patient has ongoing erosion/sclerosis process in this joint much longer than before the screws were placed. I query if this process somehow reactivates when the screws were placed. She would benefit from rheumatology input. Further more she also had hx of gout. She is on allopurinol     I gave her options to get off abx and monitor and perform other diagnostics evaluation and rheum evaluation vs  continue abx with also the above, she opts to stay on antibiotics. As I am 70-30 on this with 70% not suspecting an infectious process, I would rather have her take a non-toxic antibiotics. As mrsa never grew and her culture was sterilized on cephalexin in the past, I will have her on cefadroxil alone. Will plan for 6-12 weeks depending on what her toes will look like   She inquires if this was charcot. She does have neuropathy so it potentially could be  ------------------- 8/31 assessment I verified that I was speaking with the correct person using two identifiers. Due to the COVID-19 Pandemic/nature of visit, this service was provided via telemedicine using audio/visual media.   The patient was located at home. The provider was located in the office. The patient did consent to this visit and is aware of charges through their insurance as well as the limitations of evaluation and management by telemedicine. Other persons participating in this telemedicine service were none. Time spent on visit was greater than 15 minutes on media and in coordination of care   I had discussed with her rheumatologist and podiatrist. The concensus appear to be charcot reaction in setting of lacking of sign of infection/persistent normal crp  Moving forward, I had advised patient to get off antibiotics as of today 8/31. Repeat labs/imaging closely and repeat clinical evaluation from me and podiatry to make sure she remains ok still   Plan: Stop cefadroxil Follow up in 3 weeks with me Follow up with dr Sherryle Lis Advise her to call rheumatology to see  if for this particular question (non-infectious inflammatory condition such as charcot present) still need a physical visit, as I have rheumatology's answer already      ------------------------------------------------ Active Problems:   * No active hospital problems. *    HPI: [name] is a 66 y.o. female here from podiatry referral for concern  post op toe infection   8/31 televisit f/u Discussed with her assessment from podiatry/rheum. Podiatry team doesn't really appreciate clinical hx consistent with infection either Rheum also thinks it looks charcot reaction   Initial visit ------------- I reviewed chart  Patient has polyarticulra gout and is on allopurinol She has chronic pain and swelling in the right big toe  Due to pain she had visits with podiatry dr Sherryle Lis and ended up with arthrodesis on 8/4.  On 8/10 there was some concern for infection. I did review pictures from her today and there was only mild duskiness around the incision with mild swelling but no redness/purulence/dehiscence  She was placed on cephalexin and then after 10 days had toe aspirated which was negative on culture. Her esr/crp aorund time of aspiration was normal  She has no fever/chill   The pain is better since surgery but she said the pain is different from prior pain before surgery. The swelling remains but again no redness/fever/chill/purulence  She was started on doxy/levo a week prior to this visit   Family History  Problem Relation Age of Onset   Allergies Mother    Hypertension Mother    Heart disease Mother        possible valve problem - leaking valve   Macular degeneration Mother    Heart disease Father        pacemaker, CHF   Hypertension Father    Diabetes Father        borderline   Stroke Father 92   Kidney disease Father    Asthma Sister    Irritable bowel syndrome Sister    Allergies Sister    Heart disease Paternal Grandmother    Heart disease Paternal Grandfather    Cancer Maternal Aunt        leukemia   Cancer Maternal Aunt    Colon cancer Maternal Aunt        late 60's   Heart disease Maternal Grandmother    Heart disease Maternal Grandfather    CAD Neg Hx     Social History   Tobacco Use   Smoking status: Never    Passive exposure: Never   Smokeless tobacco: Never  Vaping Use   Vaping Use:  Never used  Substance Use Topics   Alcohol use: No    Alcohol/week: 0.0 standard drinks of alcohol   Drug use: No    Allergies  Allergen Reactions   Erythromycin Nausea Only    Abdominal pain Other reaction(s): Unknown   Meperidine Hcl    Polyethyl Glyc-Propyl Glyc Pf    Dilaudid [Hydromorphone Hcl] Itching    Review of Systems: ROS All Other ROS was negative, except mentioned above   Past Medical History:  Diagnosis Date   Anemia    previously followed by Dr. Jamse Arn for anemia and elevated platelets   Anxiety    C. difficile colitis 10/01/2012   treated by [facility]   Chronic fatigue syndrome    Closed wedge compression fracture of T8 vertebra ([place]) 06/2021   DDD (degenerative disc disease), lumbar 08/19/2014   and facet arthroplasty & left lumbar radiculopathy (Dr.Ramos)   Depression  Dyssynergia    dyssynergenic defecation, contributing to fecal incontinence.   Edema    Fibromyalgia    Gastroparesis    followed at [facility]   GERD (gastroesophageal reflux disease)    History of kidney stones    History of vertebral fracture 06/30/2021   Hyperlipidemia    Kidney stone    Lumbar radiculopathy    Migraine    Neuropathy    Obstructive sleep apnea    Does  wear  CPAP   Osteoporosis    Paresthesia    Dr. Everette Rank at [facility]   Pelvic floor dysfunction    pelvic floor dyssynergy   Plantar fasciitis 02/2011   R foot   Pneumonia    2012   PONV (postoperative nausea and vomiting)    pt states has gastroparesis has difficulty taking antibiotics and narcotics has severe nausea and vomiting    Restless leg syndrome    S/P endometrial ablation 08/09/2006   Novasure Ablation   S/P epidural steroid injection 09/20/2014   Dr.Ramos   Tremor    Dr. Everette Rank   Urinary frequency    Urinary incontinence        Scheduled Meds: Continuous Infusions: PRN Meds:.   OBJECTIVE: Last menstrual period 07/27/2006.  Physical Exam Video visit today   Lab  Results Lab Results  Component Value Date   WBC 10.8 07/19/2022   HGB 13.0 07/19/2022   HCT 39.0 07/19/2022   MCV 89.7 07/19/2022   PLT 400 07/19/2022    Lab Results  Component Value Date   CREATININE 0.94 07/24/2022   BUN 21 07/24/2022   NA 140 07/24/2022   K 4.4 07/24/2022   CL 102 07/24/2022   CO2 31 07/24/2022    Lab Results  Component Value Date   ALT 30 (H) 07/19/2022   AST 26 07/19/2022   ALKPHOS 268 (H) 03/12/2022   ALKPHOS 271 (H) 03/12/2022   BILITOT 0.2 07/19/2022      Microbiology: Recent Results (from the past 240 hour(s))  WOUND CULTURE     Status: None   Collection Time: 07/19/22 11:35 AM   Specimen: Abscess; Wound  Result Value Ref Range Status   MICRO NUMBER: [id number]  Final   SPECIMEN QUALITY: Adequate  Final   SOURCE: NOT GIVEN  Final   STATUS: FINAL  Final   GRAM STAIN:   Final    No white blood cells seen No epithelial cells seen No organisms seen   RESULT: No Growth  Final   COMMENT:   Final    No source was provided. The specimen was tested and reported based upon the test code ordered. If this is incorrect, please contact client services.     Serology:    Imaging: If present, new imagings (plain films, ct scans, and mri) have been personally visualized and interpreted; radiology reports have been reviewed. Decision making incorporated into the Impression / Recommendations.  8/22 and 8/10 xra right foot reviewed  Jabier Mutton, [place] for Infectious [place] [phone] pager    07/26/2022, 9:37 AM

## 2022-07-26 NOTE — Telephone Encounter (Signed)
Patient called the office stating her podiatrist wanted her to see her rheumatologist. Patient states Dr. Gale Journey told her he spoke with Dr. Estanislado Pandy and now says it is up to Dr. Estanislado Pandy if she thinks the patient needs to be seen in office. Patient unsure if she needs to knee appointment she has next week.

## 2022-07-26 NOTE — Progress Notes (Signed)
Called patient today regarding visit. Is scheduled to do video today.  Leatrice Jewels, RMA

## 2022-07-27 ENCOUNTER — Other Ambulatory Visit (HOSPITAL_COMMUNITY): Payer: Self-pay

## 2022-07-30 ENCOUNTER — Emergency Department (HOSPITAL_BASED_OUTPATIENT_CLINIC_OR_DEPARTMENT_OTHER): Payer: Medicare Other

## 2022-07-30 ENCOUNTER — Encounter (HOSPITAL_COMMUNITY): Payer: Self-pay

## 2022-07-30 ENCOUNTER — Other Ambulatory Visit: Payer: Self-pay

## 2022-07-30 ENCOUNTER — Inpatient Hospital Stay (HOSPITAL_BASED_OUTPATIENT_CLINIC_OR_DEPARTMENT_OTHER)
Admission: EM | Admit: 2022-07-30 | Discharge: 2022-08-08 | DRG: 478 | Disposition: A | Discharge status: Home Health | Payer: Medicare Other | Attending: Internal Medicine | Admitting: Internal Medicine

## 2022-07-30 ENCOUNTER — Encounter (HOSPITAL_COMMUNITY): Payer: Self-pay | Admitting: Internal Medicine

## 2022-07-30 ENCOUNTER — Telehealth: Payer: Self-pay | Admitting: Podiatry

## 2022-07-30 DIAGNOSIS — M1A9XX0 Chronic gout, unspecified, without tophus (tophi): Secondary | ICD-10-CM | POA: Diagnosis not present

## 2022-07-30 DIAGNOSIS — G4733 Obstructive sleep apnea (adult) (pediatric): Secondary | ICD-10-CM | POA: Diagnosis not present

## 2022-07-30 DIAGNOSIS — M14679 Charcot's joint, unspecified ankle and foot: Secondary | ICD-10-CM | POA: Diagnosis not present

## 2022-07-30 DIAGNOSIS — Z841 Family history of disorders of kidney and ureter: Secondary | ICD-10-CM

## 2022-07-30 DIAGNOSIS — Z885 Allergy status to narcotic agent status: Secondary | ICD-10-CM

## 2022-07-30 DIAGNOSIS — G2581 Restless legs syndrome: Secondary | ICD-10-CM | POA: Diagnosis present

## 2022-07-30 DIAGNOSIS — Z1611 Resistance to penicillins: Secondary | ICD-10-CM | POA: Diagnosis not present

## 2022-07-30 DIAGNOSIS — M797 Fibromyalgia: Secondary | ICD-10-CM | POA: Diagnosis not present

## 2022-07-30 DIAGNOSIS — Z6836 Body mass index (BMI) 36.0-36.9, adult: Secondary | ICD-10-CM | POA: Diagnosis not present

## 2022-07-30 DIAGNOSIS — F419 Anxiety disorder, unspecified: Secondary | ICD-10-CM | POA: Diagnosis present

## 2022-07-30 DIAGNOSIS — L03031 Cellulitis of right toe: Secondary | ICD-10-CM | POA: Diagnosis present

## 2022-07-30 DIAGNOSIS — M86171 Other acute osteomyelitis, right ankle and foot: Secondary | ICD-10-CM | POA: Diagnosis not present

## 2022-07-30 DIAGNOSIS — Z79899 Other long term (current) drug therapy: Secondary | ICD-10-CM | POA: Diagnosis not present

## 2022-07-30 DIAGNOSIS — E782 Mixed hyperlipidemia: Secondary | ICD-10-CM | POA: Diagnosis present

## 2022-07-30 DIAGNOSIS — K3184 Gastroparesis: Secondary | ICD-10-CM | POA: Diagnosis present

## 2022-07-30 DIAGNOSIS — M79671 Pain in right foot: Secondary | ICD-10-CM | POA: Diagnosis not present

## 2022-07-30 DIAGNOSIS — Z981 Arthrodesis status: Secondary | ICD-10-CM

## 2022-07-30 DIAGNOSIS — Z800 Family history of malignant neoplasm of digestive organs: Secondary | ICD-10-CM

## 2022-07-30 DIAGNOSIS — T847XXA Infection and inflammatory reaction due to other internal orthopedic prosthetic devices, implants and grafts, initial encounter: Secondary | ICD-10-CM

## 2022-07-30 DIAGNOSIS — M869 Osteomyelitis, unspecified: Secondary | ICD-10-CM | POA: Diagnosis not present

## 2022-07-30 DIAGNOSIS — F319 Bipolar disorder, unspecified: Secondary | ICD-10-CM | POA: Diagnosis present

## 2022-07-30 DIAGNOSIS — Z1624 Resistance to multiple antibiotics: Secondary | ICD-10-CM | POA: Diagnosis not present

## 2022-07-30 DIAGNOSIS — M5136 Other intervertebral disc degeneration, lumbar region: Secondary | ICD-10-CM | POA: Diagnosis not present

## 2022-07-30 DIAGNOSIS — M810 Age-related osteoporosis without current pathological fracture: Secondary | ICD-10-CM | POA: Diagnosis not present

## 2022-07-30 DIAGNOSIS — Z833 Family history of diabetes mellitus: Secondary | ICD-10-CM

## 2022-07-30 DIAGNOSIS — G629 Polyneuropathy, unspecified: Secondary | ICD-10-CM | POA: Diagnosis not present

## 2022-07-30 DIAGNOSIS — Z794 Long term (current) use of insulin: Secondary | ICD-10-CM | POA: Diagnosis not present

## 2022-07-30 DIAGNOSIS — L089 Local infection of the skin and subcutaneous tissue, unspecified: Secondary | ICD-10-CM | POA: Diagnosis not present

## 2022-07-30 DIAGNOSIS — Z8619 Personal history of other infectious and parasitic diseases: Secondary | ICD-10-CM

## 2022-07-30 DIAGNOSIS — Z7982 Long term (current) use of aspirin: Secondary | ICD-10-CM

## 2022-07-30 DIAGNOSIS — I10 Essential (primary) hypertension: Secondary | ICD-10-CM | POA: Diagnosis not present

## 2022-07-30 DIAGNOSIS — M86179 Other acute osteomyelitis, unspecified ankle and foot: Secondary | ICD-10-CM | POA: Diagnosis not present

## 2022-07-30 DIAGNOSIS — M7989 Other specified soft tissue disorders: Secondary | ICD-10-CM | POA: Diagnosis not present

## 2022-07-30 DIAGNOSIS — Z8249 Family history of ischemic heart disease and other diseases of the circulatory system: Secondary | ICD-10-CM

## 2022-07-30 DIAGNOSIS — T8140XA Infection following a procedure, unspecified, initial encounter: Secondary | ICD-10-CM | POA: Diagnosis not present

## 2022-07-30 DIAGNOSIS — M61571 Other ossification of muscle, right ankle and foot: Secondary | ICD-10-CM | POA: Diagnosis not present

## 2022-07-30 DIAGNOSIS — M1A09X0 Idiopathic chronic gout, multiple sites, without tophus (tophi): Secondary | ICD-10-CM | POA: Diagnosis present

## 2022-07-30 DIAGNOSIS — G43909 Migraine, unspecified, not intractable, without status migrainosus: Secondary | ICD-10-CM | POA: Diagnosis present

## 2022-07-30 DIAGNOSIS — Z8701 Personal history of pneumonia (recurrent): Secondary | ICD-10-CM

## 2022-07-30 DIAGNOSIS — Y831 Surgical operation with implant of artificial internal device as the cause of abnormal reaction of the patient, or of later complication, without mention of misadventure at the time of the procedure: Secondary | ICD-10-CM | POA: Diagnosis present

## 2022-07-30 DIAGNOSIS — T8459XA Infection and inflammatory reaction due to other internal joint prosthesis, initial encounter: Principal | ICD-10-CM | POA: Diagnosis present

## 2022-07-30 DIAGNOSIS — Z96653 Presence of artificial knee joint, bilateral: Secondary | ICD-10-CM | POA: Diagnosis present

## 2022-07-30 DIAGNOSIS — M19041 Primary osteoarthritis, right hand: Secondary | ICD-10-CM | POA: Diagnosis present

## 2022-07-30 DIAGNOSIS — R600 Localized edema: Secondary | ICD-10-CM | POA: Diagnosis not present

## 2022-07-30 DIAGNOSIS — M722 Plantar fascial fibromatosis: Secondary | ICD-10-CM | POA: Diagnosis present

## 2022-07-30 DIAGNOSIS — G9332 Myalgic encephalomyelitis/chronic fatigue syndrome: Secondary | ICD-10-CM | POA: Diagnosis not present

## 2022-07-30 DIAGNOSIS — M19042 Primary osteoarthritis, left hand: Secondary | ICD-10-CM | POA: Diagnosis present

## 2022-07-30 DIAGNOSIS — E669 Obesity, unspecified: Secondary | ICD-10-CM | POA: Diagnosis present

## 2022-07-30 DIAGNOSIS — Z87442 Personal history of urinary calculi: Secondary | ICD-10-CM

## 2022-07-30 DIAGNOSIS — M109 Gout, unspecified: Secondary | ICD-10-CM | POA: Diagnosis present

## 2022-07-30 DIAGNOSIS — M19071 Primary osteoarthritis, right ankle and foot: Secondary | ICD-10-CM | POA: Diagnosis present

## 2022-07-30 DIAGNOSIS — K219 Gastro-esophageal reflux disease without esophagitis: Secondary | ICD-10-CM | POA: Diagnosis present

## 2022-07-30 DIAGNOSIS — Z9049 Acquired absence of other specified parts of digestive tract: Secondary | ICD-10-CM

## 2022-07-30 DIAGNOSIS — Z881 Allergy status to other antibiotic agents status: Secondary | ICD-10-CM

## 2022-07-30 DIAGNOSIS — M5416 Radiculopathy, lumbar region: Secondary | ICD-10-CM | POA: Diagnosis present

## 2022-07-30 DIAGNOSIS — Z792 Long term (current) use of antibiotics: Secondary | ICD-10-CM

## 2022-07-30 DIAGNOSIS — Z888 Allergy status to other drugs, medicaments and biological substances status: Secondary | ICD-10-CM

## 2022-07-30 DIAGNOSIS — T8141XA Infection following a procedure, superficial incisional surgical site, initial encounter: Principal | ICD-10-CM

## 2022-07-30 DIAGNOSIS — M14671 Charcot's joint, right ankle and foot: Secondary | ICD-10-CM | POA: Diagnosis present

## 2022-07-30 DIAGNOSIS — T847XXD Infection and inflammatory reaction due to other internal orthopedic prosthetic devices, implants and grafts, subsequent encounter: Secondary | ICD-10-CM | POA: Diagnosis not present

## 2022-07-30 DIAGNOSIS — Z806 Family history of leukemia: Secondary | ICD-10-CM

## 2022-07-30 DIAGNOSIS — Z9884 Bariatric surgery status: Secondary | ICD-10-CM

## 2022-07-30 DIAGNOSIS — Z823 Family history of stroke: Secondary | ICD-10-CM

## 2022-07-30 DIAGNOSIS — Z8781 Personal history of (healed) traumatic fracture: Secondary | ICD-10-CM

## 2022-07-30 DIAGNOSIS — Z825 Family history of asthma and other chronic lower respiratory diseases: Secondary | ICD-10-CM

## 2022-07-30 DIAGNOSIS — D649 Anemia, unspecified: Secondary | ICD-10-CM | POA: Diagnosis not present

## 2022-07-30 DIAGNOSIS — Z9989 Dependence on other enabling machines and devices: Secondary | ICD-10-CM | POA: Diagnosis not present

## 2022-07-30 DIAGNOSIS — I998 Other disorder of circulatory system: Secondary | ICD-10-CM | POA: Diagnosis not present

## 2022-07-30 LAB — COMPREHENSIVE METABOLIC PANEL
ALT: 42 U/L (ref 0–44)
AST: 37 U/L (ref 15–41)
Albumin: 3.7 g/dL (ref 3.5–5.0)
Alkaline Phosphatase: 134 U/L — ABNORMAL HIGH (ref 38–126)
Anion gap: 9 (ref 5–15)
BUN: 18 mg/dL (ref 8–23)
CO2: 27 mmol/L (ref 22–32)
Calcium: 9.3 mg/dL (ref 8.9–10.3)
Chloride: 104 mmol/L (ref 98–111)
Creatinine, Ser: 0.86 mg/dL (ref 0.44–1.00)
GFR, Estimated: >60 mL/min (ref >60)
Glucose, Bld: 110 mg/dL — ABNORMAL HIGH (ref 70–99)
Potassium: 4.1 mmol/L (ref 3.5–5.1)
Sodium: 140 mmol/L (ref 135–145)
Total Bilirubin: 0.3 mg/dL (ref 0.3–1.2)
Total Protein: 6.7 g/dL (ref 6.5–8.1)

## 2022-07-30 LAB — CBC WITH DIFFERENTIAL/PLATELET
Abs Immature Granulocytes: 0.04 10*3/uL (ref 0.00–0.07)
Basophils Absolute: 0.1 10*3/uL (ref 0.0–0.1)
Basophils Relative: 1 %
Eosinophils Absolute: 0.4 10*3/uL (ref 0.0–0.5)
Eosinophils Relative: 5 %
HCT: 38.4 % (ref 36.0–46.0)
Hemoglobin: 12.7 g/dL (ref 12.0–15.0)
Immature Granulocytes: 1 %
Lymphocytes Relative: 27 %
Lymphs Abs: 2.3 10*3/uL (ref 0.7–4.0)
MCH: 30.2 pg (ref 26.0–34.0)
MCHC: 33.1 g/dL (ref 30.0–36.0)
MCV: 91.4 fL (ref 80.0–100.0)
Monocytes Absolute: 0.8 10*3/uL (ref 0.1–1.0)
Monocytes Relative: 9 %
Neutro Abs: 5.1 10*3/uL (ref 1.7–7.7)
Neutrophils Relative %: 57 %
Platelets: 301 10*3/uL (ref 150–400)
RBC: 4.2 MIL/uL (ref 3.87–5.11)
RDW: 14.5 % (ref 11.5–15.5)
WBC: 8.6 10*3/uL (ref 4.0–10.5)
nRBC: 0 % (ref 0.0–0.2)

## 2022-07-30 LAB — LACTIC ACID, PLASMA: Lactic Acid, Venous: 1.4 mmol/L (ref 0.5–1.9)

## 2022-07-30 MED ORDER — ACETAMINOPHEN 650 MG RE SUPP: 650.0000 mg | Freq: Four times a day (QID) | RECTAL | Status: DC | PRN

## 2022-07-30 MED ORDER — VANCOMYCIN HCL 1500 MG/300ML IV SOLN
1500.0000 mg | Freq: Once | INTRAVENOUS | Status: DC
  Filled 2022-07-30: qty 300

## 2022-07-30 MED ORDER — MELATONIN 5 MG PO TABS
5.0000 mg | ORAL_TABLET | Freq: Once | ORAL | Status: AC
  Administered 2022-07-30: 5 mg via ORAL
  Filled 2022-07-30: qty 1

## 2022-07-30 MED ORDER — VANCOMYCIN HCL IN DEXTROSE 1-5 GM/200ML-% IV SOLN: 1000.0000 mg | INTRAVENOUS | Status: DC

## 2022-07-30 MED ORDER — ALPRAZOLAM 0.25 MG PO TABS
0.2500 mg | ORAL_TABLET | Freq: Once | ORAL | Status: AC | PRN
  Administered 2022-07-30: 0.25 mg via ORAL
  Filled 2022-07-30: qty 1

## 2022-07-30 MED ORDER — VANCOMYCIN HCL IN DEXTROSE 1-5 GM/200ML-% IV SOLN: 1000.0000 mg | Freq: Once | INTRAVENOUS | Status: DC

## 2022-07-30 MED ORDER — OXYCODONE-ACETAMINOPHEN 7.5-325 MG PO TABS
1.0000 | ORAL_TABLET | Freq: Three times a day (TID) | ORAL | Status: DC | PRN
  Administered 2022-07-30 – 2022-08-04 (×6): 1 via ORAL
  Filled 2022-07-30 (×6): qty 1

## 2022-07-30 MED ORDER — MORPHINE SULFATE (PF) 4 MG/ML IV SOLN: 4.0000 mg | INTRAVENOUS | Status: DC | PRN

## 2022-07-30 MED ORDER — ENOXAPARIN SODIUM 40 MG/0.4ML IJ SOSY
40.0000 mg | PREFILLED_SYRINGE | INTRAMUSCULAR | Status: DC
  Administered 2022-07-30 – 2022-08-07 (×9): 40 mg via SUBCUTANEOUS
  Filled 2022-07-30 (×9): qty 0.4

## 2022-07-30 MED ORDER — SODIUM CHLORIDE 0.9 % IV SOLN
2.0000 g | Freq: Three times a day (TID) | INTRAVENOUS | Status: DC
  Administered 2022-07-30 – 2022-08-04 (×14): 2 g via INTRAVENOUS
  Filled 2022-07-30 (×16): qty 12.5

## 2022-07-30 MED ORDER — VANCOMYCIN HCL IN DEXTROSE 1-5 GM/200ML-% IV SOLN
1000.0000 mg | INTRAVENOUS | Status: DC
  Filled 2022-07-30: qty 200

## 2022-07-30 MED ORDER — ONDANSETRON HCL 4 MG/2ML IJ SOLN
4.0000 mg | Freq: Four times a day (QID) | INTRAMUSCULAR | Status: DC | PRN
  Administered 2022-07-30 – 2022-08-03 (×3): 4 mg via INTRAVENOUS
  Filled 2022-07-30 (×3): qty 2

## 2022-07-30 MED ORDER — NORTRIPTYLINE HCL 25 MG PO CAPS
100.0000 mg | ORAL_CAPSULE | Freq: Once | ORAL | Status: AC
  Administered 2022-07-30: 100 mg via ORAL
  Filled 2022-07-30: qty 4

## 2022-07-30 MED ORDER — ONDANSETRON HCL 4 MG PO TABS
4.0000 mg | ORAL_TABLET | Freq: Four times a day (QID) | ORAL | Status: DC | PRN
  Administered 2022-07-31 – 2022-08-03 (×2): 4 mg via ORAL
  Filled 2022-07-30 (×2): qty 1

## 2022-07-30 MED ORDER — ACETAMINOPHEN 325 MG PO TABS
650.0000 mg | ORAL_TABLET | Freq: Four times a day (QID) | ORAL | Status: DC | PRN
  Administered 2022-07-31 – 2022-08-06 (×6): 650 mg via ORAL
  Filled 2022-07-30 (×6): qty 2

## 2022-07-30 NOTE — Progress Notes (Signed)
Patient arrived from by [facility] transport; admitted from [facility] ED; patient is alert and oriented; no IV access noted; ambulates to bathroom; admitting MD notified of patient arrival; will continue to monitor

## 2022-07-30 NOTE — Plan of Care (Signed)
  Problem: Education: Goal: Knowledge of General Education information will improve Description: Including pain rating scale, medication(s)/side effects and non-pharmacologic comfort measures Outcome: Progressing   Problem: Nutrition: Goal: Adequate nutrition will be maintained Outcome: Progressing   Problem: Coping: Goal: Level of anxiety will decrease Outcome: Progressing   Problem: Elimination: Goal: Will not experience complications related to urinary retention Outcome: Not Applicable   Problem: Pain Managment: Goal: General experience of comfort will improve Outcome: Progressing

## 2022-07-30 NOTE — Care Plan (Signed)
[place]: [facility] Requesting Physician/APP: Ronnald Nian, MD  History: [name] is a 66 year old female with past medical history significant for polyarticular gout, osteoarthritis, fibromyalgia, chronic fatigue, morbid obesity history of Roux-en-Y gastric bypass 2020, peripheral neuropathy, depression who presented to the ED by direction of podiatry for concerns of possible infection to right great toe surgical site.  Patient apparently had surgery roughly 1 month ago with Dr. Sherryle Lis.  And has since been followed by infectious disease and intermittently on antibiotics.  Patient is afebrile without leukocytosis.  X-ray concerning for osteomyelitis.  ED physician discussed with podiatry on-call to, Dr. Jacqualyn Posey who recommended admission at [facility] or [facility] and to start IV antibiotics with vancomycin and cefepime.  Plan of Care:  --Admit to MedSurg, observation --Vancomycin and cefepime --Recommend n.p.o. after midnight, podiatry to evaluate on 9/5 for need of surgical intervention   [facility] will assume care on arrival to accepting facility. Until arrival, care as per EDP. However, [facility] available 24/7 for questions and assistance.   Please page [place] and Consults [phone]) as soon as the patient arrives to the hospital.   Timothea Bodenheimer [place] DO

## 2022-07-30 NOTE — ED Provider Notes (Signed)
[facility] EMERGENCY DEPT Provider Note   CSN: [id number] Arrival date & time: 07/30/22  1200     History  Chief Complaint  Patient presents with   Toe Pain    Right   Post-op Problem    CHERON CORYELL is a 66 y.o. female.  Patient here with right toe pain and swelling.  Surgery several weeks ago for hammertoe surgery.  She is been on and off antibiotics for concern for postop infection.  She was sent here by her podiatry team for likely admission for IV antibiotics and consultation in the morning.  She has neuropathy history but no history of diabetes.  Nothing has made it worse or better.  She denies any fevers or chills.  Denies any nausea or vomiting.  No chest pain or shortness of breath.  She has been on Duricef for the last several days.  She is seeing infectious disease for this as well.  The history is provided by the patient.       Home Medications Prior to Admission medications   Medication Sig Start Date End Date Taking? Authorizing Provider  acetaminophen (TYLENOL) 500 MG tablet Take 1,000 mg by mouth every 8 (eight) hours as needed for moderate pain. Patient not taking: Reported on 07/26/2022    [provider]  albuterol (VENTOLIN HFA) 108 (90 Base) MCG/ACT inhaler TAKE 2 PUFFS BY MOUTH EVERY 6 HOURS AS NEEDED FOR WHEEZE OR SHORTNESS OF BREATH Patient not taking: Reported on 07/26/2022 02/08/22   Rita Ohara, MD  allopurinol (ZYLOPRIM) 300 MG tablet Take 1 tablet (300 mg total) by mouth daily. 02/05/22   Rita Ohara, MD  ALPRAZolam Duanne Moron) 0.25 MG tablet Take 1 tablet (0.25 mg total) by mouth 3 (three) times daily as needed for anxiety. 03/12/22   Mozingo, Berdie Ogren, NP  ARIPiprazole (ABILIFY) 5 MG tablet Take 1 tablet (5 mg total) by mouth daily. 03/12/22   Mozingo, Berdie Ogren, NP  Armodafinil 250 MG tablet Take 1 tablet by mouth daily with breakfast. Patient not taking: Reported on 07/26/2022 07/26/22   Bo Merino, MD  aspirin 81 MG  chewable tablet     [provider]  atorvastatin (LIPITOR) 40 MG tablet Take 1 tablet (40 mg total) by mouth daily. 02/06/22   Rita Ohara, MD  CALCIUM PO Take 500 mg by mouth in the morning, at noon, and at bedtime.    [provider]  cefadroxil (DURICEF) 500 MG capsule Take 2 capsules (1,000 mg total) by mouth 2 (two) times daily. Patient not taking: Reported on 07/26/2022 07/24/22 09/04/22  Prudencio Pair T, MD  cetirizine (ZYRTEC) 10 MG tablet Take 10 mg by mouth at bedtime.    [provider]  colchicine 0.6 MG tablet Take 1.'2mg'$  (2 tablets) then 0.'6mg'$  (1 tablet) 1 hour after. Then, take 1 tablet every day for 7 days. Patient not taking: Reported on 07/26/2022 08/24/21   Criselda Peaches, DPM  DULoxetine (CYMBALTA) 60 MG capsule Take 1 capsule (60 mg total) by mouth 2 (two) times daily. 03/12/22   Mozingo, Berdie Ogren, NP  estradiol (ESTRACE VAGINAL) 0.1 MG/GM vaginal cream Place 1 g vaginally 2 (two) times a week. 03/08/22   Tamela Gammon, NP  fluticasone (FLONASE) 50 MCG/ACT nasal spray PLACE 2 SPRAYS INTO BOTH NOSTRILS DAILY AS NEEDED FOR ALLERGIES 11/14/20   Rita Ohara, MD  FORTEO 600 MCG/2.4ML SOPN INJECT [facility] UNDER THE SKIN ONCE DAILY 07/16/22   Bo Merino, MD  gabapentin (NEURONTIN) 300 MG  capsule Take 300 mg by mouth 3 (three) times daily.    [provider]  Insulin Pen Needle 31G X 5 MM MISC Use to inject Forteo once daily. DISCARD AFTER USE. 07/27/21   Ofilia Neas, PA-C  meclizine (ANTIVERT) 25 MG tablet Take 1 tablet (25 mg total) by mouth 3 (three) times daily as needed for dizziness. Patient not taking: Reported on 07/26/2022 07/22/19   Rita Ohara, MD  melatonin 5 MG TABS Take 5 mg by mouth at bedtime.     [provider]  methocarbamol (ROBAXIN) 500 MG tablet TAKE 1 TABLET BY MOUTH EVERY DAY AS NEEDED Patient not taking: Reported on 07/26/2022 03/08/22   Bo Merino, MD  metoprolol tartrate (LOPRESSOR) 50 MG tablet TAKE 1  TABLET BY MOUTH TWICE A DAY 06/04/22   Rita Ohara, MD  Multiple Vitamins-Minerals (BARIATRIC MULTIVITAMINS/IRON PO) Take 2 each by mouth daily.    [provider]  Multiple Vitamins-Minerals (ICAPS AREDS 2) CAPS Take 1 capsule by mouth 2 (two) times daily.    [provider]  mupirocin ointment (BACTROBAN) 2 % Apply 1 application  topically 2 (two) times daily. Patient not taking: Reported on 07/26/2022 05/08/22   Criselda Peaches, DPM  nortriptyline (PAMELOR) 50 MG capsule Take 100 mg by mouth at bedtime.    [provider]  ondansetron (ZOFRAN) 8 MG tablet Take 8 mg by mouth every 8 (eight) hours as needed for nausea. Patient not taking: Reported on 07/26/2022 07/16/18   [provider]  OXcarbazepine (TRILEPTAL) 150 MG tablet Take 150 mg by mouth 2 (two) times daily. 10/24/20   [provider]  pantoprazole (PROTONIX) 40 MG tablet Take 1 tablet (40 mg total) by mouth daily. 10/01/19   Johnathan Hausen, MD  Probiotic Product (PROBIOTIC ADVANCED PO) Take by mouth.    [provider]  promethazine (PHENERGAN) 25 MG tablet Take 25 mg by mouth as needed. Patient not taking: Reported on 07/26/2022 10/27/20   [provider]  RESTASIS 0.05 % ophthalmic emulsion 1 drop 2 (two) times daily. 12/10/19   [provider]      Allergies    Erythromycin, Meperidine hcl, Polyethyl glyc-propyl glyc pf, and Dilaudid [hydromorphone hcl]    Review of Systems   Review of Systems  Physical Exam Updated Vital Signs BP 122/69   Pulse 68   Temp 98.2 F (36.8 C) (Oral)   Resp 16   Ht 5' (1.524 m)   Wt 85.3 kg   LMP 07/27/2006   SpO2 98%   BMI 36.72 kg/m  Physical Exam Vitals and nursing note reviewed.  Constitutional:      General: She is not in acute distress.    Appearance: She is well-developed.  HENT:     Head: Normocephalic and atraumatic.     Mouth/Throat:     Mouth: Mucous membranes are moist.  Eyes:     Extraocular Movements:  Extraocular movements intact.     Conjunctiva/sclera: Conjunctivae normal.     Pupils: Pupils are equal, round, and reactive to light.  Cardiovascular:     Rate and Rhythm: Normal rate and regular rhythm.     Pulses: Normal pulses.     Heart sounds: No murmur heard. Pulmonary:     Effort: Pulmonary effort is normal. No respiratory distress.     Breath sounds: Normal breath sounds.  Abdominal:     Palpations: Abdomen is soft.     Tenderness: There is no abdominal tenderness.  Musculoskeletal:  General: No swelling.     Cervical back: Neck supple.  Skin:    General: Skin is warm and dry.     Capillary Refill: Capillary refill takes less than 2 seconds.     Findings: Erythema present.     Comments: Right big toe is red and swollen but there is no obvious streaking of redness up the foot/leg  Neurological:     General: No focal deficit present.     Mental Status: She is alert.  Psychiatric:        Mood and Affect: Mood normal.     ED Results / Procedures / Treatments   Labs (all labs ordered are listed, but only abnormal results are displayed) Labs Reviewed  COMPREHENSIVE METABOLIC PANEL - Abnormal; Notable for the following components:      Result Value   Glucose, Bld 110 (*)    Alkaline Phosphatase 134 (*)    All other components within normal limits  LACTIC ACID, PLASMA  CBC WITH DIFFERENTIAL/PLATELET  LACTIC ACID, PLASMA    EKG None  Radiology DG Foot Complete Right  Result Date: 07/30/2022 CLINICAL DATA:  Big toe swelling. Surgery proximally 1 month ago. Progressive pain, redness and swelling EXAM: RIGHT FOOT COMPLETE - 3+ VIEW COMPARISON:  Radiograph 07/17/2022 FINDINGS: Two screws traverse the first metatarsal phalangeal joint. There is progressive lysis and erosive changes involving the great toe interphalangeal joint, including increasing lucency adjacent to the screws. Loss of normal joint space with increasing lucency and loss of normal articular borders.  There is some bony fragmentation. There is progressive soft tissue edema about the dorsum of the foot. Examination is otherwise unchanged. IMPRESSION: Increasing lucency and erosive changes about the great toe interphalangeal joint, including increasing lucency adjacent to the surgical screws. Findings suspicious for osteomyelitis/septic arthritis. Worsening radiographic findings since August 22nd exam. Electronically Signed   By: Keith Rake M.D.   On: 07/30/2022 15:27    Procedures Procedures    Medications Ordered in ED Medications  ceFEPIme (MAXIPIME) 2 g in sodium chloride 0.9 % 100 mL IVPB (has no administration in time range)  vancomycin (VANCOCIN) IVPB 1000 mg/200 mL premix (has no administration in time range)    ED Course/ Medical Decision Making/ A&P                           Medical Decision Making Amount and/or Complexity of Data Reviewed Labs: ordered. Radiology: ordered.  Risk Decision regarding hospitalization.   HARLO JASO is here with postop problem of the right toe.  Differential diagnosis likely postop infection, could be osteomyelitis, septic joint at this point.  Patient with hammertoe surgery several weeks ago by [facility].  They sent her here today for admission for IV antibiotics as she has been on outpatient antibiotics for postop infection with not much help.  She continues to have pain and swelling in the right.  She has a history of neuropathy but no history of diabetes.  She has normal vitals.  No fever.  Well-appearing.  Have no concern for sepsis.  Lab work including CBC, CMP, lactic acid was collected as well as an x-ray of the right foot.  She has no leukocytosis or lactic acidosis.  Per further review and interpretation of labs there is no significant anemia or electrolyte abnormality.  X-ray per radiology report shows increasing lucency and erosive changes around the great toe interphalangeal joint including increased lucency adjacent to  the surgical screws.  Findings are suspicious for osteomyelitis/septic arthritis.  This is worse from x-ray several weeks ago.  I talked with Dr. Jacqualyn Posey with podiatry and recommends IV antibiotics with vancomycin and cefepime and admission to medicine for further care.  Talked with hospitalist who will admit for further care.  This chart was dictated using voice recognition software.  Despite best efforts to proofread,  errors can occur which can change the documentation meaning.         Final Clinical Impression(s) / ED Diagnoses Final diagnoses:  Infection of superficial incisional surgical site after procedure, initial encounter  Right foot infection    Rx / DC Orders ED Discharge Orders     None         Lennice Sites, DO 07/30/22 1607

## 2022-07-30 NOTE — Progress Notes (Signed)
Pharmacy Antibiotic Note  [name] is a 67 y.o. female admitted on 07/30/2022 with  wound infection  - complications to R big toe surgical site. Recently on doxy/levofloxacin/cefadroxil PTA. Imaging concerning for osteomyelitis. Afebrile, WBC wnl. Pharmacy has been consulted for vancomycin/cefepime dosing. SCr 0.86 on presentation.  Plan: Cefepime 2g IV q8h Vancomycin 1g IV q24h. Goal AUC 400-550. Expected AUC: 542 SCr used: 0.86 Monitor clinical progress, c/s, renal function F/u de-escalation plan/LOT, vancomycin levels as indicated Podiatry to evaluate 9/5 for possible surgical intervention   Height: 5' (152.4 cm) Weight: 85.3 kg (188 lb) IBW/kg (Calculated) : 45.5  Temp (24hrs), Avg:98.2 F (36.8 C), Min:98.2 F (36.8 C), Max:98.2 F (36.8 C)  Recent Labs  Lab 07/24/22 1545 07/30/22 1232  WBC  --  8.6  CREATININE 0.94 0.86  LATICACIDVEN  --  1.4    Estimated Creatinine Clearance: 63.2 mL/min (by C-G formula based on SCr of 0.86 mg/dL).    Allergies  Allergen Reactions   Erythromycin Nausea Only    Abdominal pain Other reaction(s): Unknown   Meperidine Hcl    Polyethyl Glyc-Propyl Glyc Pf    Dilaudid [Hydromorphone Hcl] Itching    Antimicrobials this admission: 9/4 vancomycin >>  9/4 cefepime >>   Dose adjustments this admission:   Microbiology results:   Arturo Morton, PharmD, BCPS Please check AMION for all [facility] contact numbers Clinical Pharmacist 07/30/2022 3:22 PM

## 2022-07-30 NOTE — Telephone Encounter (Signed)
Patient called stating that she had surgery with Dr. Sherryle Lis and she has been having some issues with the toe. She states it has started to swell more. She apparently called Saturday for the on-call provider and she was directed to go back on the antibiotic. She states that the skin is splitting between the between the big toe and the second toe.   Despite being on antibiotics the foot is swelling more and increased pain. Due this I have recommended that the patient go to the ER for further evaluation.

## 2022-07-30 NOTE — ED Triage Notes (Addendum)
Patient arrives with complaints of complications to right big toe surgical site. Patient had surgery ~1 month ago. Patient has been seeing her doctor and infectious disease, being placed intermittently on antibiotics.  Patient reports worsening pain to right foot, redness/swelling to site. Rates pain a 10/10.  Patient was referred her by her doctor for IV antibiotics.

## 2022-07-30 NOTE — H&P (Signed)
History and Physical    Patient: [name] TML:[id number] DOB: [birth year] DOA: 07/30/2022 DOS: the patient was seen and examined on 07/30/2022 PCP: Rita Ohara, MD  Patient coming from: Home  Chief Complaint:  Chief Complaint  Patient presents with   Toe Pain    Right   Post-op Problem   HPI: KAHLEA [name] is a 66 y.o. female with medical history significant of HLD, gout, fibromyalgia, bipolar disorder. Presenting with right great toe pain and swelling. She had surgery on that toe last month. Over the last couple of weeks, she had developed increased swelling and pain. She has followed with podiatry and ID on this matter and had intermittent abx. However the swelling and pain persist. She has not re-injured her toe. She has not had any new drainage. She has not had any fever. When her symptoms did not improve this morning, she spoke with the on-call podiatrist. It was recommended that she come to the ED for evaluation. She denies any other aggravating or alleviating factors.    Review of Systems: As mentioned in the history of present illness. All other systems reviewed and are negative. Past Medical History:  Diagnosis Date   Anemia    previously followed by Dr. Jamse Arn for anemia and elevated platelets   Anxiety    C. difficile colitis 10/01/2012   treated by [facility]   Chronic fatigue syndrome    Closed wedge compression fracture of T8 vertebra ([place]) 06/2021   DDD (degenerative disc disease), lumbar 08/19/2014   and facet arthroplasty & left lumbar radiculopathy (Dr.Ramos)   Depression    Dyssynergia    dyssynergenic defecation, contributing to fecal incontinence.   Edema    Fibromyalgia    Gastroparesis    followed at [facility]   GERD (gastroesophageal reflux disease)    History of kidney stones    History of vertebral fracture 06/30/2021   Hyperlipidemia    Kidney stone    Lumbar radiculopathy    Migraine    Neuropathy    Obstructive sleep apnea    Does  wear  CPAP    Osteoporosis    Paresthesia    Dr. Everette Rank at [facility]   Pelvic floor dysfunction    pelvic floor dyssynergy   Plantar fasciitis 02/2011   R foot   Pneumonia    2012   PONV (postoperative nausea and vomiting)    pt states has gastroparesis has difficulty taking antibiotics and narcotics has severe nausea and vomiting    Restless leg syndrome    S/P endometrial ablation 08/09/2006   Novasure Ablation   S/P epidural steroid injection 09/20/2014   Dr.Ramos   Tremor    Dr. Everette Rank   Urinary frequency    Urinary incontinence    Past Surgical History:  Procedure Laterality Date   CHOLECYSTECTOMY  07/2004   ENDOMETRIAL ABLATION  08/09/2006   Dr. Valentina Shaggy Ablation   FACET JOINT INJECTION  04/17/2017   Left L4-5 and L5-S1   FOOT SURGERY Right 06/29/2022   FUSION OF JOINT IN BIG TOE RT FOOT, Dr. Sherryle Lis   GASTRIC ROUX-EN-Y N/A 09/29/2019   Procedure: LAPAROSCOPIC ROUX-EN-Y GASTRIC BYPASS WITH UPPER ENDOSCOPY, ERAS Pathway;  Surgeon: Johnathan Hausen, MD;  Location: [facility];  Service: General;  Laterality: N/A;   [place]   R knee, Dr. Eddie Dibbles, torn cartilage   KYPHOPLASTY  11/06/2021   [id number]-[id number]   RETINAL LASER PROCEDURE Right 08/28/2018   laser  retinopexy   RIGHT/LEFT HEART CATH AND CORONARY ANGIOGRAPHY N/A 01/01/2018   Procedure: RIGHT/LEFT HEART CATH AND CORONARY ANGIOGRAPHY;  Surgeon: Sherren Mocha, MD;  Location: [place] [facility];  Service: Cardiovascular;  Laterality: N/A;   TONSILLECTOMY  1968   TONSILLECTOMY     TOTAL KNEE ARTHROPLASTY Right 09/05/2015   Procedure: RIGHT TOTAL KNEE ARTHROPLASTY;  Surgeon: Gaynelle Arabian, MD;  Location: [facility];  Service: Orthopedics;  Laterality: Right;   TOTAL KNEE ARTHROPLASTY Left 07/14/2018   Procedure: LEFT TOTAL KNEE ARTHROPLASTY;  Surgeon: Gaynelle Arabian, MD;  Location: [facility];  Service: Orthopedics;  Laterality: Left;   ULTRASOUND GUIDANCE FOR VASCULAR ACCESS  01/01/2018    Procedure: Ultrasound Guidance For Vascular Access;  Surgeon: Sherren Mocha, MD;  Location: [place] [facility];  Service: Cardiovascular;;   UMBILICAL HERNIA REPAIR N/A 09/29/2019   Procedure: HERNIA REPAIR UMBILICAL ADULT;  Surgeon: Johnathan Hausen, MD;  Location: [facility];  Service: General;  Laterality: N/A;   Social History:  reports that she has never smoked. She has never been exposed to tobacco smoke. She has never used smokeless tobacco. She reports that she does not drink alcohol and does not use drugs.  Allergies  Allergen Reactions   Erythromycin Nausea Only    Abdominal pain Other reaction(s): Unknown   Meperidine Hcl    Polyethyl Glyc-Propyl Glyc Pf Nausea And Vomiting    RN confirmed with patient 9/4   Dilaudid [Hydromorphone Hcl] Itching    Family History  Problem Relation Age of Onset   Allergies Mother    Hypertension Mother    Heart disease Mother        possible valve problem - leaking valve   Macular degeneration Mother    Macular degeneration Father    Heart disease Father        pacemaker, CHF   Hypertension Father    Diabetes Father        borderline   Stroke Father 59   Kidney disease Father    Allergies Sister    Asthma Sister    Irritable bowel syndrome Sister    Macular degeneration Sister    Heart disease Maternal Grandmother    Heart disease Maternal Grandfather    Heart disease Paternal Grandmother    Heart disease Paternal Grandfather    Cancer Maternal Aunt        leukemia   Cancer Maternal Aunt    Colon cancer Maternal Aunt        late 60's   CAD Neg Hx     Prior to Admission medications   Medication Sig Start Date End Date Taking? Authorizing Provider  acetaminophen (TYLENOL) 500 MG tablet Take 1,000 mg by mouth every 8 (eight) hours as needed for moderate pain. Patient not taking: Reported on 07/26/2022    [provider]  albuterol (VENTOLIN HFA) 108 (90 Base) MCG/ACT inhaler TAKE 2 PUFFS BY MOUTH EVERY 6 HOURS AS NEEDED  FOR WHEEZE OR SHORTNESS OF BREATH Patient not taking: Reported on 07/26/2022 02/08/22   Rita Ohara, MD  allopurinol (ZYLOPRIM) 300 MG tablet Take 1 tablet (300 mg total) by mouth daily. 02/05/22   Rita Ohara, MD  ALPRAZolam Duanne Moron) 0.25 MG tablet Take 1 tablet (0.25 mg total) by mouth 3 (three) times daily as needed for anxiety. 03/12/22   Mozingo, Berdie Ogren, NP  ARIPiprazole (ABILIFY) 5 MG tablet Take 1 tablet (5 mg total) by mouth daily. 03/12/22   Mozingo, Berdie Ogren, NP  Armodafinil 250 MG tablet Take  1 tablet by mouth daily with breakfast. Patient not taking: Reported on 07/26/2022 07/26/22   Bo Merino, MD  aspirin 81 MG chewable tablet     [provider]  atorvastatin (LIPITOR) 40 MG tablet Take 1 tablet (40 mg total) by mouth daily. 02/06/22   Rita Ohara, MD  CALCIUM PO Take 500 mg by mouth in the morning, at noon, and at bedtime.    [provider]  cefadroxil (DURICEF) 500 MG capsule Take 2 capsules (1,000 mg total) by mouth 2 (two) times daily. Patient not taking: Reported on 07/26/2022 07/24/22 09/04/22  Prudencio Pair T, MD  cetirizine (ZYRTEC) 10 MG tablet Take 10 mg by mouth at bedtime.    [provider]  colchicine 0.6 MG tablet Take 1.33m (2 tablets) then 0.631m(1 tablet) 1 hour after. Then, take 1 tablet every day for 7 days. Patient not taking: Reported on 07/26/2022 08/24/21   McCriselda PeachesDPM  DULoxetine (CYMBALTA) 60 MG capsule Take 1 capsule (60 mg total) by mouth 2 (two) times daily. 03/12/22   Mozingo, ReBerdie OgrenNP  estradiol (ESTRACE VAGINAL) 0.1 MG/GM vaginal cream Place 1 g vaginally 2 (two) times a week. 03/08/22   WaTamela GammonNP  fluticasone (FLONASE) 50 MCG/ACT nasal spray PLACE 2 SPRAYS INTO BOTH NOSTRILS DAILY AS NEEDED FOR ALLERGIES 11/14/20   KnRita OharaMD  FORTEO 600 MCG/2.4ML SOPN INJECT [facility] 07/16/22   DeBo MerinoMD  gabapentin (NEURONTIN) 300 MG capsule Take 300 mg by mouth 3  (three) times daily.    [provider]  Insulin Pen Needle 31G X 5 MM MISC Use to inject Forteo once daily. DISCARD AFTER USE. 07/27/21   DaOfilia NeasPA-C  meclizine (ANTIVERT) 25 MG tablet Take 1 tablet (25 mg total) by mouth 3 (three) times daily as needed for dizziness. Patient not taking: Reported on 07/26/2022 07/22/19   KnRita OharaMD  melatonin 5 MG TABS Take 5 mg by mouth at bedtime.     [provider]  methocarbamol (ROBAXIN) 500 MG tablet TAKE 1 TABLET BY MOUTH EVERY DAY AS NEEDED Patient not taking: Reported on 07/26/2022 03/08/22   DeBo MerinoMD  metoprolol tartrate (LOPRESSOR) 50 MG tablet TAKE 1 TABLET BY MOUTH TWICE A DAY 06/04/22   KnRita OharaMD  Multiple Vitamins-Minerals (BARIATRIC MULTIVITAMINS/IRON PO) Take 2 each by mouth daily.    [provider]  Multiple Vitamins-Minerals (ICAPS AREDS 2) CAPS Take 1 capsule by mouth 2 (two) times daily.    [provider]  mupirocin ointment (BACTROBAN) 2 % Apply 1 application  topically 2 (two) times daily. Patient not taking: Reported on 07/26/2022 05/08/22   McCriselda PeachesDPM  nortriptyline (PAMELOR) 50 MG capsule Take 100 mg by mouth at bedtime.    [provider]  ondansetron (ZOFRAN) 8 MG tablet Take 8 mg by mouth every 8 (eight) hours as needed for nausea. Patient not taking: Reported on 07/26/2022 07/16/18   [provider]  OXcarbazepine (TRILEPTAL) 150 MG tablet Take 150 mg by mouth 2 (two) times daily. 10/24/20   [provider]  pantoprazole (PROTONIX) 40 MG tablet Take 1 tablet (40 mg total) by mouth daily. 10/01/19   MaJohnathan HausenMD  Probiotic Product (PROBIOTIC ADVANCED PO) Take by mouth.    [provider]  promethazine (PHENERGAN) 25 MG tablet Take 25 mg by mouth as needed. Patient not taking: Reported on 07/26/2022 10/27/20   [provider]  RESTASIS 0.05 % ophthalmic emulsion 1 drop 2 (two) times daily. 12/10/19   [provider]    Physical Exam: Vitals:   07/30/22 1222 07/30/22 1322 07/30/22 1430 07/30/22 1654  BP:  130/70 122/69 (!) 150/91  Pulse:  64 68 (!) 55  Resp:  '16 16 16  ' Temp:    98 F (36.7 C)  TempSrc:    Oral  SpO2: 97% 96% 98% 100%  Weight:      Height:       General: 66 y.o. female resting in bed in NAD Eyes: PERRL, normal sclera ENMT: Nares patent w/o discharge, orophaynx clear, dentition normal, ears w/o discharge/lesions/ulcers Neck: Supple, trachea midline Cardiovascular: RRR, +S1, S2, no m/g/r, equal pulses throughout Respiratory: CTABL, no w/r/r, normal WOB GI: BS+, NDNT, no masses noted, no organomegaly noted MSK: No c/c; rigth great toe edema/erythema/pain with flexion Neuro: A&O x 3, no focal deficits Psyc: Appropriate interaction and affect, calm/cooperative  Data Reviewed:  Lab Results  Component Value Date   NA 140 07/30/2022   K 4.1 07/30/2022   CO2 27 07/30/2022   GLUCOSE 110 (H) 07/30/2022   BUN 18 07/30/2022   CREATININE 0.86 07/30/2022   CALCIUM 9.3 07/30/2022   EGFR 66 07/19/2022   GFRNONAA >60 07/30/2022   Lab Results  Component Value Date   WBC 8.6 07/30/2022   HGB 12.7 07/30/2022   HCT 38.4 07/30/2022   MCV 91.4 07/30/2022   PLT 301 07/30/2022   XR right foot Increasing lucency and erosive changes about the great toe interphalangeal joint, including increasing lucency adjacent to the surgical screws. Findings suspicious for osteomyelitis/septic arthritis. Worsening radiographic findings since August 22nd exam.  Assessment and Plan: Right great toe osteomyelitis     - admit to inpt, med-surg     - continue current abx     - podiatry to see in AM, make NPOpMN     - spoke with ID, re-consult after podiatry plan     - pain control  Idiopathic chronic gout     - continue home regimen when confirmed  Bipolar d/o     - continue home regimen when confirmed  HLD     - continue home regimen when confirmed  OSA     - CPAP  qHS  Peripheral neuropathy     - continue home regimen when confirmed  Advance Care Planning:   Code Status: FULL  Consults: Podiatry  Family Communication: w/ husband at bedside  Severity of Illness: The appropriate patient status for this patient is INPATIENT. Inpatient status is judged to be reasonable and necessary in order to provide the required intensity of service to ensure the patient's safety. The patient's presenting symptoms, physical exam findings, and initial radiographic and laboratory data in the context of their chronic comorbidities is felt to place them at high risk for further clinical deterioration. Furthermore, it is not anticipated that the patient will be medically stable for discharge from the hospital within 2 midnights of admission.   * I certify that at the point of admission it is my clinical judgment that the patient will require inpatient hospital care spanning beyond 2 midnights from the point of admission due to high intensity of service, high risk for further deterioration and high frequency of surveillance required.*  Time spent in coordination of this H&P: 50 minutes   Author: Jonnie Finner, DO 07/30/2022 5:13 PM  For on call review [email or web address].

## 2022-07-31 DIAGNOSIS — Z6836 Body mass index (BMI) 36.0-36.9, adult: Secondary | ICD-10-CM | POA: Diagnosis not present

## 2022-07-31 DIAGNOSIS — G2581 Restless legs syndrome: Secondary | ICD-10-CM | POA: Diagnosis present

## 2022-07-31 DIAGNOSIS — M722 Plantar fascial fibromatosis: Secondary | ICD-10-CM | POA: Diagnosis present

## 2022-07-31 DIAGNOSIS — M86171 Other acute osteomyelitis, right ankle and foot: Secondary | ICD-10-CM | POA: Diagnosis not present

## 2022-07-31 DIAGNOSIS — Z1611 Resistance to penicillins: Secondary | ICD-10-CM | POA: Diagnosis present

## 2022-07-31 DIAGNOSIS — T847XXA Infection and inflammatory reaction due to other internal orthopedic prosthetic devices, implants and grafts, initial encounter: Secondary | ICD-10-CM | POA: Diagnosis not present

## 2022-07-31 DIAGNOSIS — L03031 Cellulitis of right toe: Secondary | ICD-10-CM | POA: Diagnosis not present

## 2022-07-31 DIAGNOSIS — M810 Age-related osteoporosis without current pathological fracture: Secondary | ICD-10-CM | POA: Diagnosis present

## 2022-07-31 DIAGNOSIS — Y831 Surgical operation with implant of artificial internal device as the cause of abnormal reaction of the patient, or of later complication, without mention of misadventure at the time of the procedure: Secondary | ICD-10-CM | POA: Diagnosis present

## 2022-07-31 DIAGNOSIS — M797 Fibromyalgia: Secondary | ICD-10-CM | POA: Diagnosis present

## 2022-07-31 DIAGNOSIS — F419 Anxiety disorder, unspecified: Secondary | ICD-10-CM | POA: Diagnosis present

## 2022-07-31 DIAGNOSIS — M5416 Radiculopathy, lumbar region: Secondary | ICD-10-CM | POA: Diagnosis present

## 2022-07-31 DIAGNOSIS — G4733 Obstructive sleep apnea (adult) (pediatric): Secondary | ICD-10-CM | POA: Diagnosis present

## 2022-07-31 DIAGNOSIS — M14679 Charcot's joint, unspecified ankle and foot: Secondary | ICD-10-CM | POA: Diagnosis not present

## 2022-07-31 DIAGNOSIS — D649 Anemia, unspecified: Secondary | ICD-10-CM | POA: Diagnosis present

## 2022-07-31 DIAGNOSIS — Z9989 Dependence on other enabling machines and devices: Secondary | ICD-10-CM | POA: Diagnosis not present

## 2022-07-31 DIAGNOSIS — M14671 Charcot's joint, right ankle and foot: Secondary | ICD-10-CM | POA: Diagnosis not present

## 2022-07-31 DIAGNOSIS — M5136 Other intervertebral disc degeneration, lumbar region: Secondary | ICD-10-CM | POA: Diagnosis present

## 2022-07-31 DIAGNOSIS — Z8619 Personal history of other infectious and parasitic diseases: Secondary | ICD-10-CM | POA: Diagnosis not present

## 2022-07-31 DIAGNOSIS — Z96653 Presence of artificial knee joint, bilateral: Secondary | ICD-10-CM | POA: Diagnosis present

## 2022-07-31 DIAGNOSIS — I10 Essential (primary) hypertension: Secondary | ICD-10-CM | POA: Diagnosis not present

## 2022-07-31 DIAGNOSIS — G9332 Myalgic encephalomyelitis/chronic fatigue syndrome: Secondary | ICD-10-CM | POA: Diagnosis present

## 2022-07-31 DIAGNOSIS — G629 Polyneuropathy, unspecified: Secondary | ICD-10-CM | POA: Diagnosis present

## 2022-07-31 DIAGNOSIS — M869 Osteomyelitis, unspecified: Secondary | ICD-10-CM | POA: Diagnosis present

## 2022-07-31 DIAGNOSIS — M79671 Pain in right foot: Secondary | ICD-10-CM | POA: Diagnosis not present

## 2022-07-31 DIAGNOSIS — K219 Gastro-esophageal reflux disease without esophagitis: Secondary | ICD-10-CM | POA: Diagnosis present

## 2022-07-31 DIAGNOSIS — E782 Mixed hyperlipidemia: Secondary | ICD-10-CM | POA: Diagnosis present

## 2022-07-31 DIAGNOSIS — M1A9XX0 Chronic gout, unspecified, without tophus (tophi): Secondary | ICD-10-CM | POA: Diagnosis not present

## 2022-07-31 DIAGNOSIS — T8140XA Infection following a procedure, unspecified, initial encounter: Secondary | ICD-10-CM | POA: Diagnosis not present

## 2022-07-31 DIAGNOSIS — M86179 Other acute osteomyelitis, unspecified ankle and foot: Secondary | ICD-10-CM | POA: Diagnosis not present

## 2022-07-31 DIAGNOSIS — Z79899 Other long term (current) drug therapy: Secondary | ICD-10-CM | POA: Diagnosis not present

## 2022-07-31 DIAGNOSIS — F319 Bipolar disorder, unspecified: Secondary | ICD-10-CM

## 2022-07-31 DIAGNOSIS — T847XXD Infection and inflammatory reaction due to other internal orthopedic prosthetic devices, implants and grafts, subsequent encounter: Secondary | ICD-10-CM | POA: Diagnosis not present

## 2022-07-31 DIAGNOSIS — Z794 Long term (current) use of insulin: Secondary | ICD-10-CM | POA: Diagnosis not present

## 2022-07-31 DIAGNOSIS — Z1624 Resistance to multiple antibiotics: Secondary | ICD-10-CM | POA: Diagnosis present

## 2022-07-31 DIAGNOSIS — T8459XA Infection and inflammatory reaction due to other internal joint prosthesis, initial encounter: Secondary | ICD-10-CM | POA: Diagnosis present

## 2022-07-31 DIAGNOSIS — Z9884 Bariatric surgery status: Secondary | ICD-10-CM | POA: Diagnosis not present

## 2022-07-31 DIAGNOSIS — K3184 Gastroparesis: Secondary | ICD-10-CM | POA: Diagnosis present

## 2022-07-31 LAB — COMPREHENSIVE METABOLIC PANEL
ALT: 46 U/L — ABNORMAL HIGH (ref 0–44)
AST: 44 U/L — ABNORMAL HIGH (ref 15–41)
Albumin: 3.3 g/dL — ABNORMAL LOW (ref 3.5–5.0)
Alkaline Phosphatase: 139 U/L — ABNORMAL HIGH (ref 38–126)
Anion gap: 8 (ref 5–15)
BUN: 17 mg/dL (ref 8–23)
CO2: 27 mmol/L (ref 22–32)
Calcium: 8.7 mg/dL — ABNORMAL LOW (ref 8.9–10.3)
Chloride: 105 mmol/L (ref 98–111)
Creatinine, Ser: 0.78 mg/dL (ref 0.44–1.00)
GFR, Estimated: >60 mL/min (ref >60)
Glucose, Bld: 114 mg/dL — ABNORMAL HIGH (ref 70–99)
Potassium: 4.1 mmol/L (ref 3.5–5.1)
Sodium: 140 mmol/L (ref 135–145)
Total Bilirubin: 0.4 mg/dL (ref 0.3–1.2)
Total Protein: 6.4 g/dL — ABNORMAL LOW (ref 6.5–8.1)

## 2022-07-31 LAB — CBC
HCT: 38.8 % (ref 36.0–46.0)
Hemoglobin: 12.6 g/dL (ref 12.0–15.0)
MCH: 30.4 pg (ref 26.0–34.0)
MCHC: 32.5 g/dL (ref 30.0–36.0)
MCV: 93.5 fL (ref 80.0–100.0)
Platelets: 278 10*3/uL (ref 150–400)
RBC: 4.15 MIL/uL (ref 3.87–5.11)
RDW: 14.4 % (ref 11.5–15.5)
WBC: 9 10*3/uL (ref 4.0–10.5)
nRBC: 0 % (ref 0.0–0.2)

## 2022-07-31 LAB — HIV ANTIBODY (ROUTINE TESTING W REFLEX): HIV Screen 4th Generation wRfx: NONREACTIVE

## 2022-07-31 MED ORDER — POLYVINYL ALCOHOL 1.4 % OP SOLN
1.0000 [drp] | OPHTHALMIC | Status: DC | PRN
  Filled 2022-07-31: qty 15

## 2022-07-31 MED ORDER — VANCOMYCIN HCL 1750 MG/350ML IV SOLN
1750.0000 mg | Freq: Once | INTRAVENOUS | Status: AC
  Administered 2022-07-31: 1750 mg via INTRAVENOUS
  Filled 2022-07-31: qty 350

## 2022-07-31 MED ORDER — ARIPIPRAZOLE 10 MG PO TABS
5.0000 mg | ORAL_TABLET | Freq: Every day | ORAL | Status: DC
  Administered 2022-07-31 – 2022-08-08 (×8): 5 mg via ORAL
  Filled 2022-07-31 (×9): qty 1

## 2022-07-31 MED ORDER — ALPRAZOLAM 0.25 MG PO TABS
0.2500 mg | ORAL_TABLET | Freq: Three times a day (TID) | ORAL | Status: DC | PRN
Start: 2022-07-31 — End: 2022-08-08
  Administered 2022-07-31 – 2022-08-07 (×12): 0.25 mg via ORAL
  Filled 2022-07-31 (×12): qty 1

## 2022-07-31 MED ORDER — PRESERVISION AREDS 2 PO CAPS
1.0000 | ORAL_CAPSULE | Freq: Every day | ORAL | Status: DC
Start: 2022-07-31 — End: 2022-07-31

## 2022-07-31 MED ORDER — PROCHLORPERAZINE EDISYLATE 10 MG/2ML IJ SOLN
10.0000 mg | Freq: Four times a day (QID) | INTRAMUSCULAR | Status: DC | PRN
  Administered 2022-07-31 – 2022-08-03 (×4): 10 mg via INTRAVENOUS
  Filled 2022-07-31 (×4): qty 2

## 2022-07-31 MED ORDER — METOPROLOL TARTRATE 50 MG PO TABS
50.0000 mg | ORAL_TABLET | Freq: Two times a day (BID) | ORAL | Status: DC
  Administered 2022-07-31 – 2022-08-08 (×17): 50 mg via ORAL
  Filled 2022-07-31 (×17): qty 1

## 2022-07-31 MED ORDER — CYCLOSPORINE 0.05 % OP EMUL
1.0000 [drp] | Freq: Two times a day (BID) | OPHTHALMIC | Status: DC
  Administered 2022-07-31 – 2022-08-08 (×17): 1 [drp] via OPHTHALMIC
  Filled 2022-07-31 (×18): qty 30

## 2022-07-31 MED ORDER — ATORVASTATIN CALCIUM 40 MG PO TABS
40.0000 mg | ORAL_TABLET | Freq: Every day | ORAL | Status: DC
  Administered 2022-07-31 – 2022-08-07 (×8): 40 mg via ORAL
  Filled 2022-07-31 (×8): qty 1

## 2022-07-31 MED ORDER — DULOXETINE HCL 60 MG PO CPEP
60.0000 mg | ORAL_CAPSULE | Freq: Two times a day (BID) | ORAL | Status: DC
  Administered 2022-07-31 – 2022-08-08 (×16): 60 mg via ORAL
  Filled 2022-07-31 (×17): qty 1

## 2022-07-31 MED ORDER — PANTOPRAZOLE SODIUM 40 MG PO TBEC
40.0000 mg | DELAYED_RELEASE_TABLET | Freq: Every day | ORAL | Status: DC
  Administered 2022-07-31 – 2022-08-07 (×8): 40 mg via ORAL
  Filled 2022-07-31 (×8): qty 1

## 2022-07-31 MED ORDER — VANCOMYCIN HCL IN DEXTROSE 1-5 GM/200ML-% IV SOLN
1000.0000 mg | INTRAVENOUS | Status: DC
  Administered 2022-08-01 – 2022-08-04 (×4): 1000 mg via INTRAVENOUS
  Filled 2022-07-31 (×4): qty 200

## 2022-07-31 MED ORDER — POLYETHYL GLYC-PROPYL GLYC PF 0.4-0.3 % OP SOLN: 1.0000 [drp] | Freq: Three times a day (TID) | OPHTHALMIC | Status: DC | PRN

## 2022-07-31 MED ORDER — METHOCARBAMOL 500 MG PO TABS
500.0000 mg | ORAL_TABLET | Freq: Two times a day (BID) | ORAL | Status: DC | PRN
  Administered 2022-08-04: 500 mg via ORAL
  Filled 2022-07-31: qty 1

## 2022-07-31 MED ORDER — ALLOPURINOL 300 MG PO TABS
300.0000 mg | ORAL_TABLET | Freq: Every day | ORAL | Status: DC
  Administered 2022-07-31 – 2022-08-08 (×7): 300 mg via ORAL
  Filled 2022-07-31 (×8): qty 1

## 2022-07-31 MED ORDER — NORTRIPTYLINE HCL 25 MG PO CAPS
100.0000 mg | ORAL_CAPSULE | Freq: Every day | ORAL | Status: DC
  Administered 2022-07-31 – 2022-08-07 (×6): 100 mg via ORAL
  Filled 2022-07-31 (×9): qty 4

## 2022-07-31 MED ORDER — PROSIGHT PO TABS
1.0000 | ORAL_TABLET | Freq: Every day | ORAL | Status: DC
  Administered 2022-07-31 – 2022-08-08 (×7): 1 via ORAL
  Filled 2022-07-31 (×8): qty 1

## 2022-07-31 MED ORDER — LORATADINE 10 MG PO TABS
10.0000 mg | ORAL_TABLET | Freq: Every day | ORAL | Status: DC
  Administered 2022-07-31 – 2022-08-08 (×7): 10 mg via ORAL
  Filled 2022-07-31 (×8): qty 1

## 2022-07-31 MED ORDER — GABAPENTIN 300 MG PO CAPS
300.0000 mg | ORAL_CAPSULE | Freq: Three times a day (TID) | ORAL | Status: DC
  Administered 2022-07-31 – 2022-08-08 (×23): 300 mg via ORAL
  Filled 2022-07-31 (×24): qty 1

## 2022-07-31 MED ORDER — OXCARBAZEPINE 150 MG PO TABS
150.0000 mg | ORAL_TABLET | Freq: Two times a day (BID) | ORAL | Status: DC
  Administered 2022-07-31 – 2022-08-08 (×17): 150 mg via ORAL
  Filled 2022-07-31 (×18): qty 1

## 2022-07-31 MED ORDER — MELATONIN 5 MG PO TABS
5.0000 mg | ORAL_TABLET | Freq: Every day | ORAL | Status: DC
  Administered 2022-07-31 – 2022-08-07 (×8): 5 mg via ORAL
  Filled 2022-07-31 (×8): qty 1

## 2022-07-31 NOTE — Plan of Care (Signed)
  Problem: Education: Goal: Knowledge of General Education information will improve Description: Including pain rating scale, medication(s)/side effects and non-pharmacologic comfort measures Outcome: Progressing   Problem: Activity: Goal: Risk for activity intolerance will decrease Outcome: Progressing   Problem: Pain Managment: Goal: General experience of comfort will improve Outcome: Progressing   

## 2022-07-31 NOTE — Consult Note (Addendum)
Reason for Consult:Cellulitis, Charcot right foot Referring Physician: Dr. Dwyane Dee, MD  [name] is an 66 y.o. female.  HPI: 66 year old female presents the hospital due to worsening swelling, redness of her foot.  She recently underwent surgical intervention with Dr. Ericka Pontiff for hallux IPJ fusion.  She has had multiple different procedures on the toe which she states started as an ingrown toenail that got infected she had a bone biopsy performed.  Ultimately she ended up with a hallux IPJ arthrodesis.  Afterwards she is continue to have swelling and intermittent erythema to the toe.  X-rays previously in office were concerning for infection versus Charcot.  Patient is concerned about any further surgery she states that every time and procedures performed the toe gets bigger.  Past Medical History:  Diagnosis Date   Anemia    previously followed by Dr. Jamse Arn for anemia and elevated platelets   Anxiety    C. difficile colitis 10/01/2012   treated by [facility]   Chronic fatigue syndrome    Closed wedge compression fracture of T8 vertebra ([place]) 06/2021   DDD (degenerative disc disease), lumbar 08/19/2014   and facet arthroplasty & left lumbar radiculopathy (Dr.Ramos)   Depression    Dyssynergia    dyssynergenic defecation, contributing to fecal incontinence.   Edema    Fibromyalgia    Gastroparesis    followed at [facility]   GERD (gastroesophageal reflux disease)    History of kidney stones    History of vertebral fracture 06/30/2021   Hyperlipidemia    Kidney stone    Lumbar radiculopathy    Migraine    Neuropathy    Obstructive sleep apnea    Does  wear  CPAP   Osteoporosis    Paresthesia    Dr. Everette Rank at [facility]   Pelvic floor dysfunction    pelvic floor dyssynergy   Plantar fasciitis 02/2011   R foot   Pneumonia    2012   PONV (postoperative nausea and vomiting)    pt states has gastroparesis has difficulty taking antibiotics and narcotics has severe  nausea and vomiting    Restless leg syndrome    S/P endometrial ablation 08/09/2006   Novasure Ablation   S/P epidural steroid injection 09/20/2014   Dr.Ramos   Tremor    Dr. Everette Rank   Urinary frequency    Urinary incontinence     Past Surgical History:  Procedure Laterality Date   CHOLECYSTECTOMY  07/2004   ENDOMETRIAL ABLATION  08/09/2006   Dr. Valentina Shaggy Ablation   FACET JOINT INJECTION  04/17/2017   Left L4-5 and L5-S1   FOOT SURGERY Right 06/29/2022   FUSION OF JOINT IN BIG TOE RT FOOT, Dr. Sherryle Lis   GASTRIC ROUX-EN-Y N/A 09/29/2019   Procedure: LAPAROSCOPIC ROUX-EN-Y GASTRIC BYPASS WITH UPPER ENDOSCOPY, ERAS Pathway;  Surgeon: Johnathan Hausen, MD;  Location: [facility];  Service: General;  Laterality: N/A;   [place]   R knee, Dr. Eddie Dibbles, torn cartilage   KYPHOPLASTY  11/06/2021   T8-T9-Nundkumar   RETINAL LASER PROCEDURE Right 08/28/2018   laser retinopexy   RIGHT/LEFT HEART CATH AND CORONARY ANGIOGRAPHY N/A 01/01/2018   Procedure: RIGHT/LEFT HEART CATH AND CORONARY ANGIOGRAPHY;  Surgeon: Sherren Mocha, MD;  Location: [place] [facility];  Service: Cardiovascular;  Laterality: N/A;   TONSILLECTOMY  1968   TONSILLECTOMY     TOTAL KNEE ARTHROPLASTY Right 09/05/2015   Procedure: RIGHT TOTAL KNEE ARTHROPLASTY;  Surgeon: Pilar Plate  Aluisio, MD;  Location: [facility];  Service: Orthopedics;  Laterality: Right;   TOTAL KNEE ARTHROPLASTY Left 07/14/2018   Procedure: LEFT TOTAL KNEE ARTHROPLASTY;  Surgeon: Gaynelle Arabian, MD;  Location: [facility];  Service: Orthopedics;  Laterality: Left;   ULTRASOUND GUIDANCE FOR VASCULAR ACCESS  01/01/2018   Procedure: Ultrasound Guidance For Vascular Access;  Surgeon: Sherren Mocha, MD;  Location: [place] [facility];  Service: Cardiovascular;;   UMBILICAL HERNIA REPAIR N/A 09/29/2019   Procedure: HERNIA REPAIR UMBILICAL ADULT;  Surgeon: Johnathan Hausen, MD;  Location: [facility];  Service: General;  Laterality: N/A;     Family History  Problem Relation Age of Onset   Allergies Mother    Hypertension Mother    Heart disease Mother        possible valve problem - leaking valve   Macular degeneration Mother    Macular degeneration Father    Heart disease Father        pacemaker, CHF   Hypertension Father    Diabetes Father        borderline   Stroke Father 21   Kidney disease Father    Allergies Sister    Asthma Sister    Irritable bowel syndrome Sister    Macular degeneration Sister    Heart disease Maternal Grandmother    Heart disease Maternal Grandfather    Heart disease Paternal Grandmother    Heart disease Paternal Grandfather    Cancer Maternal Aunt        leukemia   Cancer Maternal Aunt    Colon cancer Maternal Aunt        late 64's   CAD Neg Hx     Social History:  reports that she has never smoked. She has never been exposed to tobacco smoke. She has never used smokeless tobacco. She reports that she does not drink alcohol and does not use drugs.  Allergies:  Allergies  Allergen Reactions   Erythromycin Nausea Only and Other (See Comments)    Abdominal pain, also   Meperidine Hcl Other (See Comments)    Made the patient feel hot and her heart raced- made her feel "flushed," also   Polyethyl Glyc-Propyl Glyc Pf Nausea And Vomiting and Other (See Comments)    RN confirmed with patient 9/4 (Miralax)   Dilaudid [Hydromorphone Hcl] Itching    Medications: I have reviewed the patient's current medications.  Results for orders placed or performed during the hospital encounter of 07/30/22 (from the past 48 hour(s))  Lactic acid, plasma     Status: None   Collection Time: 07/30/22 12:32 PM  Result Value Ref Range   Lactic Acid, Venous 1.4 0.5 - 1.9 mmol/L    Comment: Performed at [facility], [address] [place], [place] [address]  Comprehensive metabolic panel     Status: Abnormal   Collection Time: 07/30/22 12:32 PM  Result Value Ref Range    Sodium 140 135 - 145 mmol/L   Potassium 4.1 3.5 - 5.1 mmol/L   Chloride 104 98 - 111 mmol/L   CO2 27 22 - 32 mmol/L   Glucose, Bld 110 (H) 70 - 99 mg/dL    Comment: Glucose reference range applies only to samples taken after fasting for at least 8 hours.   BUN 18 8 - 23 mg/dL   Creatinine, Ser 0.86 0.44 - 1.00 mg/dL   Calcium 9.3 8.9 - 10.3 mg/dL   Total Protein 6.7 6.5 - 8.1 g/dL   Albumin 3.7 3.5 -  5.0 g/dL   AST 37 15 - 41 U/L   ALT 42 0 - 44 U/L   Alkaline Phosphatase 134 (H) 38 - 126 U/L   Total Bilirubin 0.3 0.3 - 1.2 mg/dL   GFR, Estimated >60 >60 mL/min    Comment: (NOTE) Calculated using the CKD-EPI Creatinine Equation (2021)    Anion gap 9 5 - 15    Comment: Performed at [facility], [address] [place], [place] [address]  CBC with Differential     Status: None   Collection Time: 07/30/22 12:32 PM  Result Value Ref Range   WBC 8.6 4.0 - 10.5 K/uL   RBC 4.20 3.87 - 5.11 MIL/uL   Hemoglobin 12.7 12.0 - 15.0 g/dL   HCT 38.4 36.0 - 46.0 %   MCV 91.4 80.0 - 100.0 fL   MCH 30.2 26.0 - 34.0 pg   MCHC 33.1 30.0 - 36.0 g/dL   RDW 14.5 11.5 - 15.5 %   Platelets 301 150 - 400 K/uL   nRBC 0.0 0.0 - 0.2 %   Neutrophils Relative % 57 %   Neutro Abs 5.1 1.7 - 7.7 K/uL   Lymphocytes Relative 27 %   Lymphs Abs 2.3 0.7 - 4.0 K/uL   Monocytes Relative 9 %   Monocytes Absolute 0.8 0.1 - 1.0 K/uL   Eosinophils Relative 5 %   Eosinophils Absolute 0.4 0.0 - 0.5 K/uL   Basophils Relative 1 %   Basophils Absolute 0.1 0.0 - 0.1 K/uL   Immature Granulocytes 1 %   Abs Immature Granulocytes 0.04 0.00 - 0.07 K/uL    Comment: Performed at [facility], [place], [place] [address]  HIV Antibody (routine testing w rflx)     Status: None   Collection Time: 07/31/22  3:17 AM  Result Value Ref Range   HIV Screen 4th Generation wRfx Non Reactive Non Reactive    Comment: Performed at [facility], [address]  [place], [place] [address]  Comprehensive metabolic panel     Status: Abnormal   Collection Time: 07/31/22  3:17 AM  Result Value Ref Range   Sodium 140 135 - 145 mmol/L   Potassium 4.1 3.5 - 5.1 mmol/L   Chloride 105 98 - 111 mmol/L   CO2 27 22 - 32 mmol/L   Glucose, Bld 114 (H) 70 - 99 mg/dL    Comment: Glucose reference range applies only to samples taken after fasting for at least 8 hours.   BUN 17 8 - 23 mg/dL   Creatinine, Ser 0.78 0.44 - 1.00 mg/dL   Calcium 8.7 (L) 8.9 - 10.3 mg/dL   Total Protein 6.4 (L) 6.5 - 8.1 g/dL   Albumin 3.3 (L) 3.5 - 5.0 g/dL   AST 44 (H) 15 - 41 U/L   ALT 46 (H) 0 - 44 U/L   Alkaline Phosphatase 139 (H) 38 - 126 U/L   Total Bilirubin 0.4 0.3 - 1.2 mg/dL   GFR, Estimated >60 >60 mL/min    Comment: (NOTE) Calculated using the CKD-EPI Creatinine Equation (2021)    Anion gap 8 5 - 15    Comment: Performed at [facility], [place] [address] [place], [place] [address]  CBC     Status: None   Collection Time: 07/31/22  3:17 AM  Result Value Ref Range   WBC 9.0 4.0 - 10.5 K/uL   RBC 4.15 3.87 - 5.11 MIL/uL   Hemoglobin 12.6 12.0 -  15.0 g/dL   HCT 38.8 36.0 - 46.0 %   MCV 93.5 80.0 - 100.0 fL   MCH 30.4 26.0 - 34.0 pg   MCHC 32.5 30.0 - 36.0 g/dL   RDW 14.4 11.5 - 15.5 %   Platelets 278 150 - 400 K/uL   nRBC 0.0 0.0 - 0.2 %    Comment: Performed at [facility], [place] [address] [place], [place] [address]   *Note: Due to a large number of results and/or encounters for the requested time period, some results have not been displayed. A complete set of results can be found in Results Review.    DG Foot Complete Right  Result Date: 07/30/2022 CLINICAL DATA:  Big toe swelling. Surgery proximally 1 month ago. Progressive pain, redness and swelling EXAM: RIGHT FOOT COMPLETE - 3+ VIEW COMPARISON:  Radiograph 07/17/2022 FINDINGS: Two screws traverse the first metatarsal phalangeal joint. There is progressive lysis and erosive  changes involving the great toe interphalangeal joint, including increasing lucency adjacent to the screws. Loss of normal joint space with increasing lucency and loss of normal articular borders. There is some bony fragmentation. There is progressive soft tissue edema about the dorsum of the foot. Examination is otherwise unchanged. IMPRESSION: Increasing lucency and erosive changes about the great toe interphalangeal joint, including increasing lucency adjacent to the surgical screws. Findings suspicious for osteomyelitis/septic arthritis. Worsening radiographic findings since August 22nd exam. Electronically Signed   By: Keith Rake M.D.   On: 07/30/2022 15:27    Review of Systems Blood pressure 110/60, pulse 76, temperature 97.9 F (36.6 C), temperature source Oral, resp. rate 18, height 5' (1.524 m), weight 85.3 kg, last menstrual period 07/27/2006, SpO2 93 %. Physical Exam General: AAO x3, NAD  Dermatological: Incision and surgery is healed but there is quite a bit of edema present to the toe and erythema to the toe.  There is no fluctuance crepitation.  There is edema present to the foot as well.  Slight increased temperature compared to contralateral extremity.       Vascular: Dorsalis Pedis artery and Posterior Tibial artery pedal pulses are palpable bilateral with immedate capillary fill time.There is no pain with calf compression, swelling, warmth, erythema.   Neruologic: Sensation decreased  Musculoskeletal: Discomfort on exam.  Muscular strength 5/5 in all groups tested bilateral.   Assessment/Plan: Right foot as well as concern for osteomyelitis versus Charcot  Quite concerned about the swelling to the toe and multiple procedures on this toe.  X-rays reviewed.  Concerned about infection in the lucency on the hardware.  I discussed the case with Dr. Sherryle Lis as well.  There has been concerned about Charcot as well.  Given the findings of order three-phase bone scan to see  if this can help differentiate.  If still clinical concern for infection would recommend screw removal as well as bone biopsy and culture.  For now continue Vanco, cefepime.  Recommend nonweightbearing using the cam boot.  Elevation.  ABI ordered to evaluate to make sure of adequate circulation.  Podiatry will continue to follow.  Trula Slade 07/31/2022, 12:46 PM

## 2022-07-31 NOTE — Progress Notes (Signed)
Progress Note    [name]   WNU:[id number]  DOB: August 05, 1956  DOA: 07/30/2022     0 PCP: Leah Ohara, MD  Initial CC: right foot pain  Hospital Course: [name] is a 66 yo female with PMH depression/anxiety, chronic fatigue syndrome, DDD, fibromyalgia, gastroparesis, GERD, HLD, lumbar radiculopathy, neuropathy, OSA, osteoporosis, plantar fasciitis, RLS. Presenting with right great toe pain and swelling. She had surgery on that toe last month. Over the last couple of weeks, she had developed increased swelling and pain. She has followed with podiatry and ID on this matter and had intermittent abx. However the swelling and pain persisted. She has not re-injured her toe. She has not had any new drainage. She has not had any fever.  She discussed with podiatry and was recommended to present to the ER for further evaluation. She was started on antibiotics on admission. X-ray showed increasing lucency and erosive changes around the great toe interphalangeal joint including increasing lucency adjacent to the surgical screws.  This raised concern for possible underlying osteomyelitis or septic arthritis.   There has also been some concern for possible underlying Charcot arthropathy as well.  Interval History:  Seen this morning with husband present bedside as well.  Still complaining of ongoing right foot pain.  We discussed further work-up still needing to be done before a plan can be decided.  In the meantime she will remain on antibiotics.  Assessment and Plan:  Right great toe pain -Toe does not have any significant erythema nor any purulent drainage.  Appearance is enlarged/edematous and tenderness.  There is some concern for her having underlying Charcot arthropathy in general - X-ray on admission does note increased lucency and erosive changes around the great toe interphalangeal joint with increasing lucency adjacent to the surgical screws; this may suggest infection but may also still be  inconclusive via x-ray -Podiatry following, appreciate assistance.  Neck step is for bone scan for further evaluation - Continue vancomycin and cefepime for now   Idiopathic chronic gout -Resume allopurinol      Bipolar d/o Depression/anxiety -Continue Abilify, Cymbalta, nortriptyline, Trileptal -Resume Xanax, database reviewed   HLD -Continue Lipitor   OSA - CPAP qHS   Peripheral neuropathy -Continue gabapentin   Old records reviewed in assessment of this patient  Antimicrobials: Vancomycin 07/31/2022 >> current Cefepime 07/30/22 >> current   DVT prophylaxis:  enoxaparin (LOVENOX) injection 40 mg Start: 07/30/22 2000   Code Status:   Code Status: Full Code  Mobility Assessment (last 72 hours)     Mobility Assessment     Row Name 07/31/22 0701 07/30/22 2200 07/30/22 1726       Does patient have an order for bedrest or is patient medically unstable No - Continue assessment No - Continue assessment No - Continue assessment     What is the highest level of mobility based on the progressive mobility assessment? Level 6 (Walks independently in room and hall) - Balance while walking in room without assist - Complete Level 6 (Walks independently in room and hall) - Balance while walking in room without assist - Complete Level 6 (Walks independently in room and hall) - Balance while walking in room without assist - Complete              Barriers to discharge:  Disposition Plan:  Home Status is: Inpt  Objective: Blood pressure 110/60, pulse 76, temperature 97.9 F (36.6 C), temperature source Oral, resp. rate 18, height 5' (1.524 m), weight 85.3  kg, last menstrual period 07/27/2006, SpO2 93 %.  Examination:  Physical Exam Constitutional:      Appearance: Normal appearance.  HENT:     Head: Normocephalic and atraumatic.     Mouth/Throat:     Mouth: Mucous membranes are moist.  Eyes:     Extraocular Movements: Extraocular movements intact.  Cardiovascular:      Rate and Rhythm: Normal rate and regular rhythm.  Pulmonary:     Effort: Pulmonary effort is normal.     Breath sounds: Normal breath sounds.  Abdominal:     General: Bowel sounds are normal. There is no distension.     Palpations: Abdomen is soft.     Tenderness: There is no abdominal tenderness.  Musculoskeletal:     Cervical back: Normal range of motion and neck supple.     Comments: Right great toe enlarged; pink no overt erythema; some TTP and with ROM; no drainage or crepitus   Skin:    General: Skin is warm and dry.  Neurological:     General: No focal deficit present.     Mental Status: She is alert.  Psychiatric:        Mood and Affect: Mood normal.        Behavior: Behavior normal.      Consultants:  Podiatry  Procedures:    Data Reviewed: Results for orders placed or performed during the hospital encounter of 07/30/22 (from the past 24 hour(s))  HIV Antibody (routine testing w rflx)     Status: None   Collection Time: 07/31/22  3:17 AM  Result Value Ref Range   HIV Screen 4th Generation wRfx Non Reactive Non Reactive  Comprehensive metabolic panel     Status: Abnormal   Collection Time: 07/31/22  3:17 AM  Result Value Ref Range   Sodium 140 135 - 145 mmol/L   Potassium 4.1 3.5 - 5.1 mmol/L   Chloride 105 98 - 111 mmol/L   CO2 27 22 - 32 mmol/L   Glucose, Bld 114 (H) 70 - 99 mg/dL   BUN 17 8 - 23 mg/dL   Creatinine, Ser 0.78 0.44 - 1.00 mg/dL   Calcium 8.7 (L) 8.9 - 10.3 mg/dL   Total Protein 6.4 (L) 6.5 - 8.1 g/dL   Albumin 3.3 (L) 3.5 - 5.0 g/dL   AST 44 (H) 15 - 41 U/L   ALT 46 (H) 0 - 44 U/L   Alkaline Phosphatase 139 (H) 38 - 126 U/L   Total Bilirubin 0.4 0.3 - 1.2 mg/dL   GFR, Estimated >60 >60 mL/min   Anion gap 8 5 - 15  CBC     Status: None   Collection Time: 07/31/22  3:17 AM  Result Value Ref Range   WBC 9.0 4.0 - 10.5 K/uL   RBC 4.15 3.87 - 5.11 MIL/uL   Hemoglobin 12.6 12.0 - 15.0 g/dL   HCT 38.8 36.0 - 46.0 %   MCV 93.5 80.0 - 100.0  fL   MCH 30.4 26.0 - 34.0 pg   MCHC 32.5 30.0 - 36.0 g/dL   RDW 14.4 11.5 - 15.5 %   Platelets 278 150 - 400 K/uL   nRBC 0.0 0.0 - 0.2 %   *Note: Due to a large number of results and/or encounters for the requested time period, some results have not been displayed. A complete set of results can be found in Results Review.    I have Reviewed nursing notes, Vitals, and Lab results since pt's last  encounter. Pertinent lab results : see above I have ordered test including BMP, CBC, Mg I have reviewed the last note from staff over past 24 hours I have discussed pt's care plan and test results with nursing staff, case manager   LOS: 0 days   Dwyane Dee, MD [facility] 07/31/2022, 12:56 PM

## 2022-07-31 NOTE — Hospital Course (Addendum)
[name] is a 66 yo female with PMH depression/anxiety, chronic fatigue syndrome, DDD, fibromyalgia, gastroparesis, GERD, HLD, lumbar radiculopathy, neuropathy, OSA, osteoporosis, plantar fasciitis, RLS. Presenting with right great toe pain and swelling. She had surgery on that toe last month. Over the last couple of weeks, she had developed increased swelling and pain. She has followed with podiatry and ID on this matter and had intermittent abx. However the swelling and pain persisted. She has not re-injured her toe. She has not had any new drainage. She has not had any fever.  She discussed with podiatry and was recommended to present to the ER for further evaluation. She was started on antibiotics on admission. X-ray showed increasing lucency and erosive changes around the great toe interphalangeal joint including increasing lucency adjacent to the surgical screws.  This raised concern for possible underlying osteomyelitis or septic arthritis.   There has also been some concern for possible underlying Charcot arthropathy as well. She ultimately underwent removal of deep implant of the right great toe along with bone biopsy and irrigation and lavage of surgical site on 08/04/2022. Hardware cultures and wound cultures grew staph epi.  She was followed by infectious disease as well.  She was recommended to continue on a 6-week course of daptomycin with first day of therapy considered 08/03/2022.  End date 09/14/2022. She was placed into a total contact cast by podiatry prior to discharge with weightbearing as tolerated recommendations in the boot with cast. She will follow-up outpatient for a cast change in 1 week and ongoing outpatient follow-up with podiatry.

## 2022-07-31 NOTE — Progress Notes (Signed)
Mobility Specialist - Progress Note   07/31/22 1436  Mobility  HOB Elevated/Bed Position Self regulated  Activity Ambulated with assistance in hallway  Range of Motion/Exercises Active  Level of Assistance Independent after set-up  Assistive Device Other (Comment) (IV [place])  Distance Ambulated (ft) 350 ft  Activity Response Tolerated well  Transport method Ambulatory  $Mobility charge 1 Mobility   Pt received in bed and agreeable to mobility.  Pt to bed after session with all needs met.     [facility]

## 2022-07-31 NOTE — Plan of Care (Signed)
Plan of care reviewed and discussed. °

## 2022-08-01 ENCOUNTER — Encounter: Payer: Self-pay | Admitting: Internal Medicine

## 2022-08-01 ENCOUNTER — Ambulatory Visit: Payer: Medicare Other | Admitting: Physician Assistant

## 2022-08-01 ENCOUNTER — Inpatient Hospital Stay (HOSPITAL_COMMUNITY): Payer: Medicare Other

## 2022-08-01 DIAGNOSIS — M79671 Pain in right foot: Secondary | ICD-10-CM | POA: Diagnosis not present

## 2022-08-01 DIAGNOSIS — L03031 Cellulitis of right toe: Secondary | ICD-10-CM

## 2022-08-01 DIAGNOSIS — M86179 Other acute osteomyelitis, unspecified ankle and foot: Secondary | ICD-10-CM

## 2022-08-01 LAB — SEDIMENTATION RATE: Sed Rate: 35 mm/hr — ABNORMAL HIGH (ref 0–22)

## 2022-08-01 LAB — CBC WITH DIFFERENTIAL/PLATELET
Abs Immature Granulocytes: 0.06 10*3/uL (ref 0.00–0.07)
Basophils Absolute: 0.1 10*3/uL (ref 0.0–0.1)
Basophils Relative: 1 %
Eosinophils Absolute: 0.3 10*3/uL (ref 0.0–0.5)
Eosinophils Relative: 5 %
HCT: 37.2 % (ref 36.0–46.0)
Hemoglobin: 11.8 g/dL — ABNORMAL LOW (ref 12.0–15.0)
Immature Granulocytes: 1 %
Lymphocytes Relative: 30 %
Lymphs Abs: 2.3 10*3/uL (ref 0.7–4.0)
MCH: 29.9 pg (ref 26.0–34.0)
MCHC: 31.7 g/dL (ref 30.0–36.0)
MCV: 94.2 fL (ref 80.0–100.0)
Monocytes Absolute: 0.8 10*3/uL (ref 0.1–1.0)
Monocytes Relative: 10 %
Neutro Abs: 4 10*3/uL (ref 1.7–7.7)
Neutrophils Relative %: 53 %
Platelets: 278 10*3/uL (ref 150–400)
RBC: 3.95 MIL/uL (ref 3.87–5.11)
RDW: 14.6 % (ref 11.5–15.5)
WBC: 7.5 10*3/uL (ref 4.0–10.5)
nRBC: 0 % (ref 0.0–0.2)

## 2022-08-01 LAB — BASIC METABOLIC PANEL
Anion gap: 6 (ref 5–15)
BUN: 15 mg/dL (ref 8–23)
CO2: 29 mmol/L (ref 22–32)
Calcium: 8.8 mg/dL — ABNORMAL LOW (ref 8.9–10.3)
Chloride: 106 mmol/L (ref 98–111)
Creatinine, Ser: 0.82 mg/dL (ref 0.44–1.00)
GFR, Estimated: >60 mL/min (ref >60)
Glucose, Bld: 104 mg/dL — ABNORMAL HIGH (ref 70–99)
Potassium: 3.8 mmol/L (ref 3.5–5.1)
Sodium: 141 mmol/L (ref 135–145)

## 2022-08-01 LAB — C-REACTIVE PROTEIN: CRP: 0.5 mg/dL (ref <1.0)

## 2022-08-01 LAB — MAGNESIUM: Magnesium: 2 mg/dL (ref 1.7–2.4)

## 2022-08-01 NOTE — Progress Notes (Signed)
PODIATRY PROGRESS NOTE  NAME [name] MRN [id number] DOB [birth year] DOA 07/30/2022   Reason for consult:  Chief Complaint  Patient presents with   Toe Pain    Right   Post-op Problem     History of present illness: 66 y.o. female admitted for worsening swelling to her right big toe.  Concern for infection.  Bone scan was ordered yesterday and so awaiting this.  Vitals:   07/31/22 2228 08/01/22 0640  BP:  (!) 140/67  Pulse: 68 64  Resp: 18 12  Temp:  98 F (36.7 C)  SpO2: 97% 96%       Latest Ref Rng & Units 08/01/2022    3:29 AM 07/31/2022    3:17 AM 07/30/2022   12:32 PM  CBC  WBC 4.0 - 10.5 K/uL 7.5  9.0  8.6   Hemoglobin 12.0 - 15.0 g/dL 40.9  81.1  91.4   Hematocrit 36.0 - 46.0 % 37.2  38.8  38.4   Platelets 150 - 400 K/uL 278  278  301        Latest Ref Rng & Units 08/01/2022    3:29 AM 07/31/2022    3:17 AM 07/30/2022   12:32 PM  BMP  Glucose 70 - 99 mg/dL 782  956  213   BUN 8 - 23 mg/dL 15  17  18    Creatinine 0.44 - 1.00 mg/dL 0.86  5.78  4.69   Sodium 135 - 145 mmol/L 141  140  140   Potassium 3.5 - 5.1 mmol/L 3.8  4.1  4.1   Chloride 98 - 111 mmol/L 106  105  104   CO2 22 - 32 mmol/L 29  27  27    Calcium 8.9 - 10.3 mg/dL 8.8  8.7  9.3       Physical Exam: General: AAO x 3, NAD-husband at bedside and both are frustrated with not having the scan done yet  Dermatology: Incision partially well-healed.  There is edema present to the toe without any significant signs of.  No fluctuance or crepitation.  Exam mostly unchanged compared to yesterday.  Vascular: Pulses palpable  Neurological: Sensation decreased  Musculoskeletal Exam: Mild pain on exam    ASSESSMENT/PLAN OF CARE   -Awaiting bone scan. I called nuclear medicine about the scheduling or this. Apparently this will not be done until the morning. I have informed the patient.  -ESR on 8/29 was 31 and CRP 3.4. I have reordered this to trend it.  -Uric acid 8/24 was 4.9. -I have discussed the  treatment options but I like to get the results of the bone scan.  If there is still concern for infection recommend removal of hardware, bone biopsy.  However if Charcot will need to be nonweightbearing and likely total contact cast. -Podiatry will continue to follow.    Please contact me directly with any questions or concerns.     Ovid Curd, DPM [facility]  Dr. Lesia Sago. Li Bobo, DPM    [address] [place], [place] [address]                Office [phone]  Fax [phone]

## 2022-08-01 NOTE — Progress Notes (Signed)
ABI has been completed.   Results can be found under chart review under CV PROC. 08/01/2022 2:09 PM Mima Cranmore RVT, RDMS

## 2022-08-01 NOTE — Progress Notes (Signed)
PROGRESS NOTE  [name]  GBT:[id number] DOB: 15-Feb-1956 DOA: 07/30/2022 PCP: Rita Ohara, MD   Brief Narrative:  Patient is a 66 year old female with history of depression, anxiety, chronic fatigue syndrome, degenerative disc disease, fibromyalgia, gastroparesis, GERD, hyperlipidemia, lumbar radiculopathy, neuropathy, OSA who presented with complaints of right great toe pain, swelling.  Patient recently had surgery on the toe last month but for the last couple weeks, she developed increased pain and swelling and followed-up with podiatry and infectious disease and was on antibiotics.  Since the wound was not healing, she was directed  to the emergency department by podiatrist.  X-ray showed increased lucency/erosions  around the great toe interphalangeal joint including increased lucency adjacent to the surgical screws.  Concern for osteomyelitis/osteoarthritis.  Started on broad-spectrum antibiotics.  Planning for bone scan.  Assessment & Plan:  Principal Problem:   Right foot pain Active Problems:   Mixed hyperlipidemia   OSA (obstructive sleep apnea)   Gout   Peripheral polyneuropathy   Osteomyelitis (HCC)   Bipolar disorder (HCC)  Right great toe pain: History as above.  No significant erythema or purulence noted on admission.  There was edema, tenderness on the local site.  Concern for Charcot arthropathy.  Images findings as above.  Started on broad spectrum antibiotics.  Podiatry following.  Planning for bone scan.  Follow-up cultures  Idiopathic chronic gout: On allopurinol  Bipolar disorder/anxiety/depression: On Abilify, Cymbalta, nortriptyline, Trileptal, Xanax  Hyperlipidemia: On Lipitor  OSA: CPAP at night  Neuropathy: On Gabapentin  Obesity: BMI of 36.7       DVT prophylaxis:enoxaparin (LOVENOX) injection 40 mg Start: 07/30/22 2000     Code Status: Full Code  Family Communication: Husband at bedside  Patient status:Inpatient  Patient is from  :Home  Anticipated discharge XT:GGYI  Estimated DC date:1-2 days   Consultants: Podiatry  Procedures:None  Antimicrobials:  Anti-infectives (From admission, onward)    Start     Dose/Rate Route Frequency Ordered Stop   08/01/22 1200  vancomycin (VANCOCIN) IVPB 1000 mg/200 mL premix        1,000 mg 200 mL/hr over 60 Minutes Intravenous Every 24 hours 07/31/22 1124     07/31/22 1630  vancomycin (VANCOCIN) IVPB 1000 mg/200 mL premix  Status:  Discontinued        1,000 mg 200 mL/hr over 60 Minutes Intravenous Every 24 hours 07/30/22 1602 07/30/22 1604   07/31/22 1215  vancomycin (VANCOREADY) IVPB 1750 mg/350 mL        1,750 mg 175 mL/hr over 120 Minutes Intravenous  Once 07/31/22 1117 07/31/22 1457   07/30/22 1615  vancomycin (VANCOCIN) IVPB 1000 mg/200 mL premix  Status:  Discontinued       See Hyperspace for full Linked Orders Report.   1,000 mg 200 mL/hr over 60 Minutes Intravenous  Once 07/30/22 1602 07/30/22 1604   07/30/22 1615  vancomycin (VANCOCIN) IVPB 1000 mg/200 mL premix  Status:  Discontinued       See Hyperspace for full Linked Orders Report.   1,000 mg 200 mL/hr over 60 Minutes Intravenous  Once 07/30/22 1602 07/30/22 1604   07/30/22 1604  vancomycin (VANCOCIN) IVPB 1000 mg/200 mL premix  Status:  Discontinued        1,000 mg 200 mL/hr over 60 Minutes Intravenous Every 24 hours 07/30/22 1604 07/31/22 1117   07/30/22 1600  ceFEPIme (MAXIPIME) 2 g in sodium chloride 0.9 % 100 mL IVPB        2 g 200 mL/hr over 30  Minutes Intravenous Every 8 hours 07/30/22 1555     07/30/22 1600  vancomycin (VANCOREADY) IVPB 1500 mg/300 mL  Status:  Discontinued        1,500 mg 150 mL/hr over 120 Minutes Intravenous  Once 07/30/22 1555 07/30/22 1602       Subjective: Patient seen and examined at bedside this morning.  Hemodynamically stable.  Lying in bed.  Complains of nausea today.  Objective: Vitals:   07/31/22 1339 07/31/22 2120 07/31/22 2228 08/01/22 0640  BP: (!) 151/71  126/85  (!) 140/67  Pulse: 100 66 68 64  Resp: '16 18 18 12  '$ Temp: 97.9 F (36.6 C) 97.8 F (36.6 C)  98 F (36.7 C)  TempSrc: Oral Oral    SpO2: 97% 99% 97% 96%  Weight:      Height:        Intake/Output Summary (Last 24 hours) at 08/01/2022 0623 Last data filed at 08/01/2022 0600 Gross per 24 hour  Intake 1809.99 ml  Output --  Net 1809.99 ml   Filed Weights   07/30/22 1219  Weight: 85.3 kg    Examination:  General exam: Overall comfortable, not in distress.,  Obese HEENT: PERRL Respiratory system:  no wheezes or crackles  Cardiovascular system: S1 & S2 heard, RRR.  Gastrointestinal system: Abdomen is nondistended, soft and nontender. Central nervous system: Alert and oriented Extremities: no clubbing ,no cyanosis, edematous/right great toe Skin: No rashes, no ulcers,no icterus     Data Reviewed: I have personally reviewed following labs and imaging studies  CBC: Recent Labs  Lab 07/30/22 1232 07/31/22 0317 08/01/22 0329  WBC 8.6 9.0 7.5  NEUTROABS 5.1  --  4.0  HGB 12.7 12.6 11.8*  HCT 38.4 38.8 37.2  MCV 91.4 93.5 94.2  PLT 301 278 762   Basic Metabolic Panel: Recent Labs  Lab 07/30/22 1232 07/31/22 0317 08/01/22 0329  NA 140 140 141  K 4.1 4.1 3.8  CL 104 105 106  CO2 '27 27 29  '$ GLUCOSE 110* 114* 104*  BUN '18 17 15  '$ CREATININE 0.86 0.78 0.82  CALCIUM 9.3 8.7* 8.8*  MG  --   --  2.0     No results found for this or any previous visit (from the past 240 hour(s)).   Radiology Studies: DG Foot Complete Right  Result Date: 07/30/2022 CLINICAL DATA:  Big toe swelling. Surgery proximally 1 month ago. Progressive pain, redness and swelling EXAM: RIGHT FOOT COMPLETE - 3+ VIEW COMPARISON:  Radiograph 07/17/2022 FINDINGS: Two screws traverse the first metatarsal phalangeal joint. There is progressive lysis and erosive changes involving the great toe interphalangeal joint, including increasing lucency adjacent to the screws. Loss of normal joint space with  increasing lucency and loss of normal articular borders. There is some bony fragmentation. There is progressive soft tissue edema about the dorsum of the foot. Examination is otherwise unchanged. IMPRESSION: Increasing lucency and erosive changes about the great toe interphalangeal joint, including increasing lucency adjacent to the surgical screws. Findings suspicious for osteomyelitis/septic arthritis. Worsening radiographic findings since August 22nd exam. Electronically Signed   By: Keith Rake M.D.   On: 07/30/2022 15:27    Scheduled Meds:  allopurinol  300 mg Oral Daily   ARIPiprazole  5 mg Oral Daily   atorvastatin  40 mg Oral QHS   cycloSPORINE  1 drop Both Eyes BID   DULoxetine  60 mg Oral BID   enoxaparin (LOVENOX) injection  40 mg Subcutaneous Q24H   gabapentin  300  mg Oral TID   loratadine  10 mg Oral Daily   melatonin  5 mg Oral QHS   metoprolol tartrate  50 mg Oral BID   multivitamin  1 tablet Oral Daily   nortriptyline  100 mg Oral q1800   OXcarbazepine  150 mg Oral BID   pantoprazole  40 mg Oral QHS   Continuous Infusions:  ceFEPime (MAXIPIME) IV 2 g (08/01/22 0046)   vancomycin       LOS: 1 day   Shelly Coss, MD [facility] P9/04/2022, 8:33 AM

## 2022-08-01 NOTE — Progress Notes (Signed)
Transition of Care [facility]) Screening Note  Patient Details  Name: [name] Date of Birth: November 23, 1956  Transition of Care [facility]) CM/SW Contact:    Sherie Don, LCSW Phone Number: 08/01/2022, 10:17 AM  Transition of Care Department [facility]) has reviewed patient and no TOC needs have been identified at this time. We will continue to monitor patient advancement through interdisciplinary progression rounds. If new patient transition needs arise, please place a TOC consult.

## 2022-08-02 ENCOUNTER — Encounter: Payer: Medicare Other | Admitting: Podiatry

## 2022-08-02 ENCOUNTER — Inpatient Hospital Stay (HOSPITAL_COMMUNITY): Payer: Medicare Other

## 2022-08-02 DIAGNOSIS — M79671 Pain in right foot: Secondary | ICD-10-CM | POA: Diagnosis not present

## 2022-08-02 LAB — CBC WITH DIFFERENTIAL/PLATELET
Abs Immature Granulocytes: 0.05 10*3/uL (ref 0.00–0.07)
Basophils Absolute: 0.1 10*3/uL (ref 0.0–0.1)
Basophils Relative: 1 %
Eosinophils Absolute: 0.4 10*3/uL (ref 0.0–0.5)
Eosinophils Relative: 4 %
HCT: 38.4 % (ref 36.0–46.0)
Hemoglobin: 12.3 g/dL (ref 12.0–15.0)
Immature Granulocytes: 1 %
Lymphocytes Relative: 27 %
Lymphs Abs: 2.5 10*3/uL (ref 0.7–4.0)
MCH: 29.8 pg (ref 26.0–34.0)
MCHC: 32 g/dL (ref 30.0–36.0)
MCV: 93 fL (ref 80.0–100.0)
Monocytes Absolute: 0.9 10*3/uL (ref 0.1–1.0)
Monocytes Relative: 9 %
Neutro Abs: 5.5 10*3/uL (ref 1.7–7.7)
Neutrophils Relative %: 58 %
Platelets: 272 10*3/uL (ref 150–400)
RBC: 4.13 MIL/uL (ref 3.87–5.11)
RDW: 14.5 % (ref 11.5–15.5)
WBC: 9.4 10*3/uL (ref 4.0–10.5)
nRBC: 0 % (ref 0.0–0.2)

## 2022-08-02 LAB — BASIC METABOLIC PANEL
Anion gap: 7 (ref 5–15)
BUN: 17 mg/dL (ref 8–23)
CO2: 27 mmol/L (ref 22–32)
Calcium: 9 mg/dL (ref 8.9–10.3)
Chloride: 108 mmol/L (ref 98–111)
Creatinine, Ser: 0.8 mg/dL (ref 0.44–1.00)
GFR, Estimated: >60 mL/min (ref >60)
Glucose, Bld: 101 mg/dL — ABNORMAL HIGH (ref 70–99)
Potassium: 3.4 mmol/L — ABNORMAL LOW (ref 3.5–5.1)
Sodium: 142 mmol/L (ref 135–145)

## 2022-08-02 LAB — MAGNESIUM: Magnesium: 2.1 mg/dL (ref 1.7–2.4)

## 2022-08-02 MED ORDER — POTASSIUM CHLORIDE CRYS ER 20 MEQ PO TBCR
40.0000 meq | EXTENDED_RELEASE_TABLET | Freq: Once | ORAL | Status: AC
  Administered 2022-08-02: 40 meq via ORAL
  Filled 2022-08-02: qty 2

## 2022-08-02 MED ORDER — TECHNETIUM TC 99M EXAMETAZIME IV KIT
30.6000 | PACK | Freq: Once | INTRAVENOUS | Status: AC | PRN
  Administered 2022-08-02: 30.6 via INTRAVENOUS

## 2022-08-02 NOTE — Progress Notes (Signed)
Mobility Specialist Cancellation/Refusal Note:   Reason for Cancellation/Refusal: Pt declined mobility at this time. IV team in room. Will check back as schedule permits.       [facility]

## 2022-08-02 NOTE — Progress Notes (Signed)
Pharmacy Antibiotic Note  [name] is a 66 y.o. female admitted on 07/30/2022 with  wound infection  - complications to R big toe surgical site. Recently on doxy/levofloxacin/cefadroxil PTA. Imaging concerning for osteomyelitis/septic arthritis. Pharmacy has been consulted for Vancomycin and Cefepime dosing.   Plan: Continue Cefepime 2g IV q8h Continue Vancomycin 1g IV q24h Vancomycin levels at steady state, as indicated Monitor renal function, cultures, clinical course F/u results of bone scan    Height: 5' (152.4 cm) Weight: 85.3 kg (188 lb) IBW/kg (Calculated) : 45.5  Temp (24hrs), Avg:97.9 F (36.6 C), Min:97.7 F (36.5 C), Max:98.1 F (36.7 C)  Recent Labs  Lab 07/30/22 1232 07/31/22 0317 08/01/22 0329 08/02/22 0341  WBC 8.6 9.0 7.5 9.4  CREATININE 0.86 0.78 0.82 0.80  LATICACIDVEN 1.4  --   --   --      Estimated Creatinine Clearance: 68 mL/min (by C-G formula based on SCr of 0.8 mg/dL).    Allergies  Allergen Reactions   Erythromycin Nausea Only and Other (See Comments)    Abdominal pain, also   Meperidine Hcl Other (See Comments)    Made the patient feel hot and her heart raced- made her feel "flushed," also   Polyethyl Glyc-Propyl Glyc Pf Nausea And Vomiting and Other (See Comments)    RN confirmed with patient 9/4 (Miralax)   Dilaudid [Hydromorphone Hcl] Itching     Lindell Spar, PharmD, BCPS Clinical Pharmacist 08/02/2022 1:53 PM

## 2022-08-02 NOTE — Progress Notes (Signed)
PROGRESS NOTE  [name]  [id number]:[id number] DOB: 04/25/1956 DOA: 07/30/2022 PCP: Rita Ohara, MD   Brief Narrative:  Patient is a 66 year old female with history of depression, anxiety, chronic fatigue syndrome, degenerative disc disease, fibromyalgia, gastroparesis, GERD, hyperlipidemia, lumbar radiculopathy, neuropathy, OSA who presented with complaints of right great toe pain, swelling.  Patient recently had surgery on the toe last month but for the last couple weeks, she developed increased pain and swelling and followed-up with podiatry and infectious disease and was on antibiotics.  Since the wound was not healing, she was directed  to the emergency department by podiatrist.  X-ray showed increased lucency/erosions  around the great toe interphalangeal joint including increased lucency adjacent to the surgical screws.  Concern for osteomyelitis/osteoarthritis.  Started on broad-spectrum antibiotics.  Planning for bone scan.  Assessment & Plan:  Principal Problem:   Right foot pain Active Problems:   Mixed hyperlipidemia   OSA (obstructive sleep apnea)   Gout   Peripheral polyneuropathy   Osteomyelitis (HCC)   Bipolar disorder (HCC)  Right great toe pain: History as above.  No significant erythema or purulence noted on admission.  There was edema, tenderness on the local site.  Concern for Charcot arthropathy.  Images findings as above.  Started on broad spectrum antibiotics.  Podiatry following.  Planning for bone scan.  Follow-up cultures,NGTD  Idiopathic chronic gout: On allopurinol  Bipolar disorder/anxiety/depression: On Abilify, Cymbalta, nortriptyline, Trileptal, Xanax  Hyperlipidemia: On Lipitor  OSA: CPAP at night  Neuropathy: On Gabapentin  Obesity: BMI of 36.7       DVT prophylaxis:enoxaparin (LOVENOX) injection 40 mg Start: 07/30/22 2000     Code Status: Full Code  Family Communication: Husband at bedside on 9/6  Patient status:Inpatient  Patient is from  :Home  Anticipated discharge YB:OFBP  Estimated DC date:1-2 days, awaiting results of bone scan, needs podiatry clearance before discharge   Consultants: Podiatry  Procedures:None  Antimicrobials:  Anti-infectives (From admission, onward)    Start     Dose/Rate Route Frequency Ordered Stop   08/01/22 1200  vancomycin (VANCOCIN) IVPB 1000 mg/200 mL premix        1,000 mg 200 mL/hr over 60 Minutes Intravenous Every 24 hours 07/31/22 1124     07/31/22 1630  vancomycin (VANCOCIN) IVPB 1000 mg/200 mL premix  Status:  Discontinued        1,000 mg 200 mL/hr over 60 Minutes Intravenous Every 24 hours 07/30/22 1602 07/30/22 1604   07/31/22 1215  vancomycin (VANCOREADY) IVPB 1750 mg/350 mL        1,750 mg 175 mL/hr over 120 Minutes Intravenous  Once 07/31/22 1117 07/31/22 1457   07/30/22 1615  vancomycin (VANCOCIN) IVPB 1000 mg/200 mL premix  Status:  Discontinued       See Hyperspace for full Linked Orders Report.   1,000 mg 200 mL/hr over 60 Minutes Intravenous  Once 07/30/22 1602 07/30/22 1604   07/30/22 1615  vancomycin (VANCOCIN) IVPB 1000 mg/200 mL premix  Status:  Discontinued       See Hyperspace for full Linked Orders Report.   1,000 mg 200 mL/hr over 60 Minutes Intravenous  Once 07/30/22 1602 07/30/22 1604   07/30/22 1604  vancomycin (VANCOCIN) IVPB 1000 mg/200 mL premix  Status:  Discontinued        1,000 mg 200 mL/hr over 60 Minutes Intravenous Every 24 hours 07/30/22 1604 07/31/22 1117   07/30/22 1600  ceFEPIme (MAXIPIME) 2 g in sodium chloride 0.9 % 100 mL IVPB  2 g 200 mL/hr over 30 Minutes Intravenous Every 8 hours 07/30/22 1555     07/30/22 1600  vancomycin (VANCOREADY) IVPB 1500 mg/300 mL  Status:  Discontinued        1,500 mg 150 mL/hr over 120 Minutes Intravenous  Once 07/30/22 1555 07/30/22 1602       Subjective: Patient seen and examined at the bedside this morning.  Hemodynamically stable.  Overall comfortable, lying in bed.  No new complaints except  for persistent nausea. Objective: Vitals:   08/01/22 0640 08/01/22 1338 08/01/22 2203 08/02/22 0517  BP: (!) 140/67 123/69 (!) 126/58 (!) 140/78  Pulse: 64 (!) 57 (!) 56 62  Resp: '12 16 16 18  '$ Temp: 98 F (36.7 C) 98.1 F (36.7 C) 98.1 F (36.7 C) 97.7 F (36.5 C)  TempSrc:   Oral Oral  SpO2: 96% 98% 95% 100%  Weight:      Height:        Intake/Output Summary (Last 24 hours) at 08/02/2022 1108 Last data filed at 08/02/2022 0600 Gross per 24 hour  Intake 800 ml  Output --  Net 800 ml   Filed Weights   07/30/22 1219  Weight: 85.3 kg    Examination:  General exam: Overall comfortable, not in distress,obese HEENT: PERRL Respiratory system:  no wheezes or crackles  Cardiovascular system: S1 & S2 heard, RRR.  Gastrointestinal system: Abdomen is nondistended, soft and nontender. Central nervous system: Alert and oriented Extremities: Edematous/tender right great toe, no clubbing ,no cyanosis Skin: No rashes, no ulcers,no icterus       Data Reviewed: I have personally reviewed following labs and imaging studies  CBC: Recent Labs  Lab 07/30/22 1232 07/31/22 0317 08/01/22 0329 08/02/22 0341  WBC 8.6 9.0 7.5 9.4  NEUTROABS 5.1  --  4.0 5.5  HGB 12.7 12.6 11.8* 12.3  HCT 38.4 38.8 37.2 38.4  MCV 91.4 93.5 94.2 93.0  PLT 301 278 278 324   Basic Metabolic Panel: Recent Labs  Lab 07/30/22 1232 07/31/22 0317 08/01/22 0329 08/02/22 0341  NA 140 140 141 142  K 4.1 4.1 3.8 3.4*  CL 104 105 106 108  CO2 '27 27 29 27  '$ GLUCOSE 110* 114* 104* 101*  BUN '18 17 15 17  '$ CREATININE 0.86 0.78 0.82 0.80  CALCIUM 9.3 8.7* 8.8* 9.0  MG  --   --  2.0 2.1     Recent Results (from the past 240 hour(s))  Culture, blood (Routine X 2) w Reflex to ID Panel     Status: None (Preliminary result)   Collection Time: 08/01/22  9:44 AM   Specimen: BLOOD LEFT ARM  Result Value Ref Range Status   Specimen Description   Final    BLOOD LEFT ARM Performed at [facility], [place] [address] [place], [place] [address]    Special Requests   Final    BOTTLES DRAWN AEROBIC ONLY Blood Culture adequate volume Performed at [place] [address] [place], [place] [address]    Culture   Final    NO GROWTH < 24 HOURS Performed at [place] [address], [place], [place] [address]    Report Status PENDING  Incomplete  Culture, blood (Routine X 2) w Reflex to ID Panel     Status: None (Preliminary result)   Collection Time: 08/01/22 10:28 AM   Specimen: BLOOD  Result Value Ref Range Status   Specimen Description   Final  BLOOD BLOOD LEFT HAND Performed at [place] [address], [place], [place] [address]    Special Requests   Final    Blood Culture adequate volume AEROBIC BOTTLE ONLY Performed at [facility] [address], [place], [place] [address]    Culture   Final    NO GROWTH < 24 HOURS Performed at [place] [address] [place], [place] [address]    Report Status PENDING  Incomplete     Radiology Studies: VAS [place] ABI WITH/WO TBI  Result Date: 08/01/2022  LOWER EXTREMITY DOPPLER STUDY Patient Name:  [name]  Date of Exam:   08/01/2022 Medical Rec #: [id number]      Accession #:    [id number] Date of Birth: 1956/01/29       Patient Gender: F Patient Age:   53 years Exam Location:  [facility] Procedure:      VAS [place] ABI WITH/WO TBI Referring Phys: Celesta Gentile --------------------------------------------------------------------------------  Indications: Cellulitis right foot (great toe) High Risk Factors: Hyperlipidemia.  Comparison Study: No previous exams Performing Technologist: Hill, Jody RVT, RDMS  Examination Guidelines: A complete evaluation includes at minimum, Doppler waveform signals and systolic blood pressure reading at the level of bilateral brachial, anterior tibial, and posterior tibial arteries, when vessel segments are  accessible. Bilateral testing is considered an integral part of a complete examination. Photoelectric Plethysmograph (PPG) waveforms and toe systolic pressure readings are included as required and additional duplex testing as needed. Limited examinations for reoccurring indications may be performed as noted.  ABI Findings: +---------+------------------+-----+---------+------------------+ Right    Rt Pressure (mmHg)IndexWaveform Comment            +---------+------------------+-----+---------+------------------+ Brachial 132                    triphasic                   +---------+------------------+-----+---------+------------------+ PTA      154               1.12 triphasic                   +---------+------------------+-----+---------+------------------+ DP       160               1.17 triphasic                   +---------+------------------+-----+---------+------------------+ Great Toe                       Normal   too large for cuff +---------+------------------+-----+---------+------------------+ +---------+------------------+-----+---------+-------+ Left     Lt Pressure (mmHg)IndexWaveform Comment +---------+------------------+-----+---------+-------+ Brachial 137                    triphasic        +---------+------------------+-----+---------+-------+ PTA      143               1.04 triphasic        +---------+------------------+-----+---------+-------+ DP       143               1.04 triphasic        +---------+------------------+-----+---------+-------+ [place]               1.00 Normal           +---------+------------------+-----+---------+-------+ +-------+-----------+-----------+------------+------------+ ABI/TBIToday's ABIToday's TBIPrevious ABIPrevious TBI +-------+-----------+-----------+------------+------------+ Right  1.17                                            +-------+-----------+-----------+------------+------------+  Left   1.04       1.00                                +-------+-----------+-----------+------------+------------+  Unable to obtain TBI on right side due to size of toe.  Summary: Right: Resting right ankle-brachial index is within normal range. Left: Resting left ankle-brachial index is within normal range. The left toe-brachial index is normal. *See table(s) above for measurements and observations.  Electronically signed by Harold Barban MD on 08/01/2022 at 10:30:09 PM.    Final     Scheduled Meds:  allopurinol  300 mg Oral Daily   ARIPiprazole  5 mg Oral Daily   atorvastatin  40 mg Oral QHS   cycloSPORINE  1 drop Both Eyes BID   DULoxetine  60 mg Oral BID   enoxaparin (LOVENOX) injection  40 mg Subcutaneous Q24H   gabapentin  300 mg Oral TID   loratadine  10 mg Oral Daily   melatonin  5 mg Oral QHS   metoprolol tartrate  50 mg Oral BID   multivitamin  1 tablet Oral Daily   nortriptyline  100 mg Oral q1800   OXcarbazepine  150 mg Oral BID   pantoprazole  40 mg Oral QHS   Continuous Infusions:  ceFEPime (MAXIPIME) IV 2 g (08/02/22 0846)   vancomycin 1,000 mg (08/01/22 1223)     LOS: 2 days   Shelly Coss, MD [facility] P9/05/2022, 11:08 AM

## 2022-08-02 NOTE — Progress Notes (Signed)
PODIATRY PROGRESS NOTE  NAME [name] MRN [id number] DOB June 27, 1956 DOA 07/30/2022   Reason for consult:  Chief Complaint  Patient presents with   Toe Pain    Right   Post-op Problem     History of present illness: 66 y.o. female admitted for worsening swelling to her right big toe.  Concern for infection.  Bone scan was ordered and so awaiting this. States pain controlled.   Vitals:   08/01/22 2203 08/02/22 0517  BP: (!) 126/58 (!) 140/78  Pulse: (!) 56 62  Resp: 16 18  Temp: 98.1 F (36.7 C) 97.7 F (36.5 C)  SpO2: 95% 100%       Latest Ref Rng & Units 08/02/2022    3:41 AM 08/01/2022    3:29 AM 07/31/2022    3:17 AM  CBC  WBC 4.0 - 10.5 K/uL 9.4  7.5  9.0   Hemoglobin 12.0 - 15.0 g/dL 10.2  72.5  36.6   Hematocrit 36.0 - 46.0 % 38.4  37.2  38.8   Platelets 150 - 400 K/uL 272  278  278        Latest Ref Rng & Units 08/02/2022    3:41 AM 08/01/2022    3:29 AM 07/31/2022    3:17 AM  BMP  Glucose 70 - 99 mg/dL 440  347  425   BUN 8 - 23 mg/dL 17  15  17    Creatinine 0.44 - 1.00 mg/dL 9.56  3.87  5.64   Sodium 135 - 145 mmol/L 142  141  140   Potassium 3.5 - 5.1 mmol/L 3.4  3.8  4.1   Chloride 98 - 111 mmol/L 108  106  105   CO2 22 - 32 mmol/L 27  29  27    Calcium 8.9 - 10.3 mg/dL 9.0  8.8  8.7       Physical Exam: General: AAO x 3, NAD  Dermatology: Incision partially well-healed.  There is edema present to the toe without any significant signs of.  No fluctuance or crepitation.  Exam unchanged compared to yesterday.  Vascular: Pulses palpable  Neurological: Sensation decreased  Musculoskeletal Exam: Mild pain on exam    ASSESSMENT/PLAN OF CARE   -Awaiting bone scan. They are preparing to draw her blood this AM for the scan. She is aware of plans for this study. - Reviewed results of ABI with patient, ABI 1.17 RLE, TBI could not be obtained due to size of R hallux -ESR on 8/29 was 31 and CRP 3.4. Repeat ESR CRP from yesterday ESR 35 and CRP 0.5.   -Will continue to await results of the bone scan.  If there is still concern for infection recommend removal of hardware, bone biopsy.  However if Charcot will need to be nonweightbearing and likely total contact cast. -Podiatry will continue to follow.    Alex Deklen Popelka DPM   [address] [place], [place] [address]                Office [phone]  Fax [phone]

## 2022-08-02 NOTE — Progress Notes (Signed)
Mobility Specialist Cancellation/Refusal Note:    08/02/22 1116  Mobility  Activity Refused mobility    Reason for Cancellation/Refusal: Pt declined mobility at this time. Pt is nonweightbearing until tests are done. Will check back as schedule permits.    [facility]

## 2022-08-03 ENCOUNTER — Other Ambulatory Visit: Payer: Self-pay | Admitting: Rheumatology

## 2022-08-03 ENCOUNTER — Encounter (HOSPITAL_COMMUNITY)
Admission: EM | Disposition: A | Discharge status: Home Health | Payer: Self-pay | Source: Home / Self Care | Attending: Internal Medicine

## 2022-08-03 ENCOUNTER — Encounter (HOSPITAL_COMMUNITY): Payer: Self-pay | Admitting: Internal Medicine

## 2022-08-03 ENCOUNTER — Inpatient Hospital Stay (HOSPITAL_COMMUNITY): Payer: Medicare Other | Admitting: Anesthesiology

## 2022-08-03 DIAGNOSIS — Z9989 Dependence on other enabling machines and devices: Secondary | ICD-10-CM | POA: Diagnosis not present

## 2022-08-03 DIAGNOSIS — G4733 Obstructive sleep apnea (adult) (pediatric): Secondary | ICD-10-CM

## 2022-08-03 DIAGNOSIS — M79671 Pain in right foot: Secondary | ICD-10-CM | POA: Diagnosis not present

## 2022-08-03 DIAGNOSIS — T8140XA Infection following a procedure, unspecified, initial encounter: Secondary | ICD-10-CM | POA: Diagnosis not present

## 2022-08-03 DIAGNOSIS — M8000XA Age-related osteoporosis with current pathological fracture, unspecified site, initial encounter for fracture: Secondary | ICD-10-CM

## 2022-08-03 DIAGNOSIS — I10 Essential (primary) hypertension: Secondary | ICD-10-CM | POA: Diagnosis not present

## 2022-08-03 HISTORY — PX: IRRIGATION AND DEBRIDEMENT FOOT: SHX6602

## 2022-08-03 LAB — BASIC METABOLIC PANEL
Anion gap: 5 (ref 5–15)
BUN: 15 mg/dL (ref 8–23)
CO2: 26 mmol/L (ref 22–32)
Calcium: 8.4 mg/dL — ABNORMAL LOW (ref 8.9–10.3)
Chloride: 110 mmol/L (ref 98–111)
Creatinine, Ser: 0.69 mg/dL (ref 0.44–1.00)
GFR, Estimated: >60 mL/min (ref >60)
Glucose, Bld: 94 mg/dL (ref 70–99)
Potassium: 3.6 mmol/L (ref 3.5–5.1)
Sodium: 141 mmol/L (ref 135–145)

## 2022-08-03 LAB — SURGICAL PCR SCREEN
MRSA, PCR: NEGATIVE
Staphylococcus aureus: NEGATIVE

## 2022-08-03 SURGERY — IRRIGATION AND DEBRIDEMENT FOOT
Anesthesia: Monitor Anesthesia Care | Site: Toe | Laterality: Right

## 2022-08-03 MED ORDER — OXYCODONE HCL 5 MG/5ML PO SOLN: 5.0000 mg | Freq: Once | ORAL | Status: DC | PRN

## 2022-08-03 MED ORDER — ONDANSETRON HCL 4 MG/2ML IJ SOLN
INTRAMUSCULAR | Status: AC
  Filled 2022-08-03: qty 2

## 2022-08-03 MED ORDER — PROPOFOL 1000 MG/100ML IV EMUL
INTRAVENOUS | Status: AC
  Filled 2022-08-03: qty 100

## 2022-08-03 MED ORDER — ONDANSETRON HCL 4 MG/2ML IJ SOLN
INTRAMUSCULAR | Status: DC | PRN
  Administered 2022-08-03: 4 mg via INTRAVENOUS

## 2022-08-03 MED ORDER — SCOPOLAMINE 1 MG/3DAYS TD PT72
1.0000 | MEDICATED_PATCH | TRANSDERMAL | Status: DC
  Administered 2022-08-03: 1.5 mg via TRANSDERMAL
  Filled 2022-08-03: qty 1

## 2022-08-03 MED ORDER — VANCOMYCIN HCL 1000 MG IV SOLR
INTRAVENOUS | Status: AC
  Filled 2022-08-03: qty 20

## 2022-08-03 MED ORDER — PROPOFOL 500 MG/50ML IV EMUL
INTRAVENOUS | Status: DC | PRN
  Administered 2022-08-03: 100 ug/kg/min via INTRAVENOUS

## 2022-08-03 MED ORDER — EPHEDRINE 5 MG/ML INJ
INTRAVENOUS | Status: AC
  Filled 2022-08-03: qty 5

## 2022-08-03 MED ORDER — CHLORHEXIDINE GLUCONATE 0.12 % MT SOLN: 15.0000 mL | Freq: Once | OROMUCOSAL | Status: DC

## 2022-08-03 MED ORDER — KETOROLAC TROMETHAMINE 30 MG/ML IJ SOLN: 30.0000 mg | Freq: Once | INTRAMUSCULAR | Status: DC | PRN

## 2022-08-03 MED ORDER — EPHEDRINE SULFATE-NACL 50-0.9 MG/10ML-% IV SOSY
PREFILLED_SYRINGE | INTRAVENOUS | Status: DC | PRN
  Administered 2022-08-03: 5 mg via INTRAVENOUS

## 2022-08-03 MED ORDER — MIDAZOLAM HCL 2 MG/2ML IJ SOLN: 2.0000 mg | Freq: Once | INTRAMUSCULAR | Status: DC

## 2022-08-03 MED ORDER — MORPHINE SULFATE (PF) 2 MG/ML IV SOLN: 2.0000 mg | INTRAVENOUS | Status: DC | PRN

## 2022-08-03 MED ORDER — LIDOCAINE HCL (PF) 2 % IJ SOLN
INTRAMUSCULAR | Status: DC | PRN
  Administered 2022-08-03: 60 mg
  Administered 2022-08-03: 400 mg

## 2022-08-03 MED ORDER — LACTATED RINGERS IV SOLN: INTRAVENOUS | Status: DC

## 2022-08-03 MED ORDER — PROPOFOL 10 MG/ML IV BOLUS
INTRAVENOUS | Status: DC | PRN
  Administered 2022-08-03: 25 mg via INTRAVENOUS

## 2022-08-03 MED ORDER — CHLORHEXIDINE GLUCONATE CLOTH 2 % EX PADS
6.0000 | MEDICATED_PAD | Freq: Every day | CUTANEOUS | Status: DC
  Administered 2022-08-03 – 2022-08-04 (×2): 6 via TOPICAL

## 2022-08-03 MED ORDER — OXYCODONE HCL 5 MG PO TABS: 5.0000 mg | ORAL_TABLET | Freq: Once | ORAL | Status: DC | PRN

## 2022-08-03 MED ORDER — FENTANYL CITRATE PF 50 MCG/ML IJ SOSY: 25.0000 ug | PREFILLED_SYRINGE | INTRAMUSCULAR | Status: DC | PRN

## 2022-08-03 MED ORDER — BUPIVACAINE HCL (PF) 0.5 % IJ SOLN
INTRAMUSCULAR | Status: AC
  Filled 2022-08-03: qty 30

## 2022-08-03 MED ORDER — ONDANSETRON HCL 4 MG/2ML IJ SOLN: 4.0000 mg | Freq: Once | INTRAMUSCULAR | Status: DC | PRN

## 2022-08-03 MED ORDER — MIDAZOLAM HCL 2 MG/2ML IJ SOLN
INTRAMUSCULAR | Status: AC
  Administered 2022-08-03: 1 mg
  Filled 2022-08-03: qty 2

## 2022-08-03 SURGICAL SUPPLY — 56 items
APL PRP STRL LF DISP 70% ISPRP (MISCELLANEOUS)
BAG COUNTER SPONGE SURGICOUNT (BAG) ×2 IMPLANT
BAG SPNG CNTER NS LX DISP (BAG) ×1
BLADE SURG 15 STRL LF DISP TIS (BLADE) ×2 IMPLANT
BLADE SURG 15 STRL SS (BLADE) ×1
BNDG CMPR 9X4 STRL LF SNTH (GAUZE/BANDAGES/DRESSINGS)
BNDG ELASTIC 3X5.8 VLCR STR LF (GAUZE/BANDAGES/DRESSINGS) ×2 IMPLANT
BNDG ELASTIC 4X5.8 VLCR STR LF (GAUZE/BANDAGES/DRESSINGS) ×2 IMPLANT
BNDG ESMARK 4X9 LF (GAUZE/BANDAGES/DRESSINGS) IMPLANT
BNDG GAUZE DERMACEA FLUFF 4 (GAUZE/BANDAGES/DRESSINGS) ×2 IMPLANT
BNDG GZE DERMACEA 4 6PLY (GAUZE/BANDAGES/DRESSINGS) ×1
CHLORAPREP W/TINT 26 (MISCELLANEOUS) IMPLANT
CNTNR URN SCR LID CUP LEK RST (MISCELLANEOUS) IMPLANT
CONT SPEC 4OZ STRL OR WHT (MISCELLANEOUS)
COVER BACK TABLE 60X90IN (DRAPES) ×2 IMPLANT
CUFF TOURN SGL QUICK 18X4 (TOURNIQUET CUFF) IMPLANT
CUFF TOURN SGL QUICK 24 (TOURNIQUET CUFF)
CUFF TRNQT CYL 24X4X16.5-23 (TOURNIQUET CUFF) IMPLANT
DRAPE 3/4 80X56 (DRAPES) ×2 IMPLANT
DRAPE EXTREMITY T 121X128X90 (DISPOSABLE) ×2 IMPLANT
DRAPE SHEET LG 3/4 BI-LAMINATE (DRAPES) ×2 IMPLANT
DRAPE U-SHAPE 47X51 STRL (DRAPES) ×2 IMPLANT
DRSG XEROFORM 1X8 (GAUZE/BANDAGES/DRESSINGS) IMPLANT
ELECT REM PT RETURN 15FT ADLT (MISCELLANEOUS) ×2 IMPLANT
GAUZE SPONGE 4X4 12PLY STRL (GAUZE/BANDAGES/DRESSINGS) ×2 IMPLANT
GAUZE SPONGES 4X4 IMPLANT
GAUZE XEROFORM 1X8 LF (GAUZE/BANDAGES/DRESSINGS) IMPLANT
GLOVE BIO SURGEON STRL SZ7.5 (GLOVE) ×2 IMPLANT
GLOVE BIOGEL PI IND STRL 8 (GLOVE) ×2 IMPLANT
GOWN STRL REUS W/ TWL XL LVL3 (GOWN DISPOSABLE) ×2 IMPLANT
GOWN STRL REUS W/TWL XL LVL3 (GOWN DISPOSABLE) ×1
KIT BASIN OR (CUSTOM PROCEDURE TRAY) ×2 IMPLANT
KIT TURNOVER KIT A (KITS) IMPLANT
MANIFOLD NEPTUNE II (INSTRUMENTS) ×2 IMPLANT
NDL HYPO 25X1 1.5 SAFETY (NEEDLE) ×2 IMPLANT
NEEDLE HYPO 25X1 1.5 SAFETY (NEEDLE) ×1 IMPLANT
NS IRRIG 1000ML POUR BTL (IV SOLUTION) IMPLANT
PADDING CAST ABS COTTON 4X4 ST (CAST SUPPLIES) ×2 IMPLANT
SET IRRIG Y TYPE TUR BLADDER L (SET/KITS/TRAYS/PACK) IMPLANT
SPONGE T-LAP 4X18 ~~LOC~~+RFID (SPONGE) ×2 IMPLANT
STAPLER VISISTAT 35W (STAPLE) IMPLANT
STOCKINETTE 6  STRL (DRAPES) ×1
STOCKINETTE 6 STRL (DRAPES) ×2 IMPLANT
SUCTION FRAZIER HANDLE 10FR (MISCELLANEOUS)
SUCTION TUBE FRAZIER 10FR DISP (MISCELLANEOUS) IMPLANT
SUT ETHILON 3 0 PS 1 (SUTURE) IMPLANT
SUT ETHILON 4 0 PS 2 18 (SUTURE) IMPLANT
SUT MNCRL AB 3-0 PS2 18 (SUTURE) IMPLANT
SUT MNCRL AB 4-0 PS2 18 (SUTURE) IMPLANT
SUT VIC AB 2-0 SH 27 (SUTURE)
SUT VIC AB 2-0 SH 27XBRD (SUTURE) IMPLANT
SYR BULB EAR ULCER 3OZ GRN STR (SYRINGE) ×2 IMPLANT
SYR CONTROL 10ML LL (SYRINGE) ×2 IMPLANT
TUBE IRRIGATION SET MISONIX (TUBING) IMPLANT
UNDERPAD 30X36 HEAVY ABSORB (UNDERPADS AND DIAPERS) ×2 IMPLANT
YANKAUER SUCT BULB TIP NO VENT (SUCTIONS) ×2 IMPLANT

## 2022-08-03 NOTE — Anesthesia Postprocedure Evaluation (Signed)
Anesthesia Post Note  Patient: [name]  Procedure(s) Performed: foot bone biopsy and hardware removal (Right)     Patient location during evaluation: PACU Anesthesia Type: MAC Level of consciousness: awake and alert Pain management: pain level controlled Vital Signs Assessment: post-procedure vital signs reviewed and stable Respiratory status: spontaneous breathing, nonlabored ventilation, respiratory function stable and patient connected to nasal cannula oxygen Cardiovascular status: stable and blood pressure returned to baseline Postop Assessment: no apparent nausea or vomiting Anesthetic complications: no   No notable events documented.  Last Vitals:  Vitals:   08/03/22 1622 08/03/22 1830  BP: (!) 146/74 (!) 93/53  Pulse: 86 66  Resp: 18 13  Temp: 36.7 C 36.5 C  SpO2: 94% 99%    Last Pain:  Vitals:   08/03/22 1626  TempSrc:   PainSc: 4                  Autumm Hattery S

## 2022-08-03 NOTE — Transfer of Care (Signed)
Immediate Anesthesia Transfer of Care Note  Patient: [name]  Procedure(s) Performed: foot bone biopsy and hardware removal (Right)  Patient Location: PACU  Anesthesia Type:MAC  Level of Consciousness: sedated  Airway & Oxygen Therapy: Patient Spontanous Breathing and Patient connected to face mask oxygen  Post-op Assessment: Report given to RN and Post -op Vital signs reviewed and stable  Post vital signs: Reviewed and stable  Last Vitals:  Vitals Value Taken Time  BP 93/53 08/03/22 1830  Temp 36.5 C 08/03/22 1830  Pulse 69 08/03/22 1832  Resp 13 08/03/22 1832  SpO2 99 % 08/03/22 1832  Vitals shown include unvalidated device data.  Last Pain:  Vitals:   08/03/22 1626  TempSrc:   PainSc: 4       Patients Stated Pain Goal: 1 (97/74/14 2395)  Complications: No notable events documented.

## 2022-08-03 NOTE — Progress Notes (Signed)
PROGRESS NOTE  [name]  ONG:[id number] DOB: 06/28/1956 DOA: 07/30/2022 PCP: Rita Ohara, MD   Brief Narrative:  Patient is a 66 year old female with history of depression, anxiety, chronic fatigue syndrome, degenerative disc disease, fibromyalgia, gastroparesis, GERD, hyperlipidemia, lumbar radiculopathy, neuropathy, OSA who presented with complaints of right great toe pain, swelling.  Patient recently had surgery on the toe last month but for the last couple weeks, she developed increased pain and swelling and followed-up with podiatry and infectious disease and was on antibiotics.  Since the wound was not healing, she was directed  to the emergency department by podiatrist.  X-ray showed increased lucency/erosions  around the great toe interphalangeal joint including increased lucency adjacent to the surgical screws.  Concern for osteomyelitis/osteoarthritis.  Started on broad-spectrum antibiotics.  Bone scan confirmed osteomyelitis.  Further management as per podiatry  Assessment & Plan:  Principal Problem:   Right foot pain Active Problems:   Mixed hyperlipidemia   OSA (obstructive sleep apnea)   Gout   Peripheral polyneuropathy   Osteomyelitis (HCC)   Bipolar disorder (HCC)  Right great toe pain: History as above.  No significant erythema or purulence noted on admission.  There was edema, tenderness on the local site.  Concern for Charcot arthropathy.  Images findings as above.  Started on broad spectrum antibiotics.  Podiatry following. Follow-up cultures,NGTD. Bone scan showed hyperemia to the RIGHT foot and focal accumulation of tagged white blood cells in the proximal phalanx of the first RIGHT digit suggesting infectious/inflammatory process including osteomyelitis.  Idiopathic chronic gout: On allopurinol  Bipolar disorder/anxiety/depression: On Abilify, Cymbalta, nortriptyline, Trileptal, Xanax  Hyperlipidemia: On Lipitor  OSA: CPAP at night  Neuropathy: On  Gabapentin  Nausea: Continue antiemetics  Obesity: BMI of 36.7       DVT prophylaxis:enoxaparin (LOVENOX) injection 40 mg Start: 07/30/22 2000     Code Status: Full Code  Family Communication: Husband at bedside on 9/6  Patient status:Inpatient  Patient is from :Home  Anticipated discharge GM:[facility]  Estimated DC date:1-2 days, awaiting further management by podiatry  Consultants: Podiatry  Procedures:None  Antimicrobials:  Anti-infectives (From admission, onward)    Start     Dose/Rate Route Frequency Ordered Stop   08/01/22 1200  vancomycin (VANCOCIN) IVPB 1000 mg/200 mL premix        1,000 mg 200 mL/hr over 60 Minutes Intravenous Every 24 hours 07/31/22 1124     07/31/22 1630  vancomycin (VANCOCIN) IVPB 1000 mg/200 mL premix  Status:  Discontinued        1,000 mg 200 mL/hr over 60 Minutes Intravenous Every 24 hours 07/30/22 1602 07/30/22 1604   07/31/22 1215  vancomycin (VANCOREADY) IVPB 1750 mg/350 mL        1,750 mg 175 mL/hr over 120 Minutes Intravenous  Once 07/31/22 1117 07/31/22 1457   07/30/22 1615  vancomycin (VANCOCIN) IVPB 1000 mg/200 mL premix  Status:  Discontinued       See Hyperspace for full Linked Orders Report.   1,000 mg 200 mL/hr over 60 Minutes Intravenous  Once 07/30/22 1602 07/30/22 1604   07/30/22 1615  vancomycin (VANCOCIN) IVPB 1000 mg/200 mL premix  Status:  Discontinued       See Hyperspace for full Linked Orders Report.   1,000 mg 200 mL/hr over 60 Minutes Intravenous  Once 07/30/22 1602 07/30/22 1604   07/30/22 1604  vancomycin (VANCOCIN) IVPB 1000 mg/200 mL premix  Status:  Discontinued        1,000 mg 200 mL/hr over  60 Minutes Intravenous Every 24 hours 07/30/22 1604 07/31/22 1117   07/30/22 1600  ceFEPIme (MAXIPIME) 2 g in sodium chloride 0.9 % 100 mL IVPB        2 g 200 mL/hr over 30 Minutes Intravenous Every 8 hours 07/30/22 1555     07/30/22 1600  vancomycin (VANCOREADY) IVPB 1500 mg/300 mL  Status:  Discontinued        1,500  mg 150 mL/hr over 120 Minutes Intravenous  Once 07/30/22 1555 07/30/22 1602       Subjective:  Patient seen and examined at the bedside today.  Hemodynamically stable.  Complains of nausea like yesterday.  No new complaints   Objective: Vitals:   08/02/22 1447 08/02/22 2041 08/02/22 2044 08/03/22 0557  BP: (!) 126/94 (!) 156/86 136/73 122/68  Pulse: 71 71 68 61  Resp: '18 18  18  '$ Temp: 98 F (36.7 C) 97.9 F (36.6 C)  97.7 F (36.5 C)  TempSrc: Oral Oral  Oral  SpO2: 99% 100%  100%  Weight:      Height:        Intake/Output Summary (Last 24 hours) at 08/03/2022 1035 Last data filed at 08/03/2022 0741 Gross per 24 hour  Intake 680 ml  Output --  Net 680 ml   Filed Weights   07/30/22 1219  Weight: 85.3 kg    Examination:  General exam: Overall comfortable, not in distress,obese HEENT: PERRL Respiratory system:  no wheezes or crackles  Cardiovascular system: S1 & S2 heard, RRR.  Gastrointestinal system: Abdomen is nondistended, soft and nontender. Central nervous system: Alert and oriented Extremities: edematous/tender right great toe, no clubbing ,no cyanosis Skin: No rashes, no ulcers,no icterus        Data Reviewed: I have personally reviewed following labs and imaging studies  CBC: Recent Labs  Lab 07/30/22 1232 07/31/22 0317 08/01/22 0329 08/02/22 0341  WBC 8.6 9.0 7.5 9.4  NEUTROABS 5.1  --  4.0 5.5  HGB 12.7 12.6 11.8* 12.3  HCT 38.4 38.8 37.2 38.4  MCV 91.4 93.5 94.2 93.0  PLT 301 278 278 259   Basic Metabolic Panel: Recent Labs  Lab 07/30/22 1232 07/31/22 0317 08/01/22 0329 08/02/22 0341 08/03/22 0436  NA 140 140 141 142 141  K 4.1 4.1 3.8 3.4* 3.6  CL 104 105 106 108 110  CO2 '27 27 29 27 26  '$ GLUCOSE 110* 114* 104* 101* 94  BUN '18 17 15 17 15  '$ CREATININE 0.86 0.78 0.82 0.80 0.69  CALCIUM 9.3 8.7* 8.8* 9.0 8.4*  MG  --   --  2.0 2.1  --      Recent Results (from the past 240 hour(s))  Culture, blood (Routine X 2) w Reflex to ID  Panel     Status: None (Preliminary result)   Collection Time: 08/01/22  9:44 AM   Specimen: BLOOD LEFT ARM  Result Value Ref Range Status   Specimen Description   Final    BLOOD LEFT ARM Performed at [facility], [place] [address] [place], [place] [address]    Special Requests   Final    BOTTLES DRAWN AEROBIC ONLY Blood Culture adequate volume Performed at [facility] [address] [place], [place] [address]    Culture   Final    NO GROWTH 2 DAYS Performed at [facility] [address] [place], [place] [address]    Report Status PENDING  Incomplete  Culture, blood (Routine X 2) w  Reflex to ID Panel     Status: None (Preliminary result)   Collection Time: 08/01/22 10:28 AM   Specimen: BLOOD  Result Value Ref Range Status   Specimen Description   Final    BLOOD BLOOD LEFT HAND Performed at [facility] [address] [place], [place] [address]    Special Requests   Final    Blood Culture adequate volume AEROBIC BOTTLE ONLY Performed at [facility] [address] [place], [place] [address]    Culture   Final    NO GROWTH 2 DAYS Performed at [facility] [address], [place], [place] [address]    Report Status PENDING  Incomplete     Radiology Studies: NM WBC SCAN TUMOR LOC LIMITED  Result Date: 08/03/2022 CLINICAL DATA:  Suspicion of osteomyelitis. EXAM: NUCLEAR MEDICINE LEUKOCYTE SCAN TECHNIQUE: Following intravenous administration of radiolabeled white blood cells, images of the head, neck, trunk, and extremities were obtained on subsequent days. RADIOPHARMACEUTICALS:  30.6 technetium 99 Ceretec labeled autologous leukocytes IV COMPARISON:  Radiograph 07/30/2022 FINDINGS: There is hyperemia to the RIGHT foot on the early arterial phase imaging. Delayed imaging there is focal accumulation of tagged white blood cells in the first ray at the level of the proximal phalanx.  IMPRESSION: Hyperemia to the RIGHT foot and focal accumulation of tagged white blood cells in the proximal phalanx of the first RIGHT digit suggests infectious/inflammatory process including osteomyelitis. Electronically Signed   By: Suzy Bouchard M.D.   On: 08/03/2022 08:33   VAS [place] ABI WITH/WO TBI  Result Date: 08/01/2022  LOWER EXTREMITY DOPPLER STUDY Patient Name:  [name]  Date of Exam:   08/01/2022 Medical Rec #: [id number]      Accession #:    [id number] Date of Birth: [birth year]       Patient Gender: F Patient Age:   64 years Exam Location:  [facility] Procedure:      VAS [place] ABI WITH/WO TBI Referring Phys: Celesta Gentile --------------------------------------------------------------------------------  Indications: Cellulitis right foot (great toe) High Risk Factors: Hyperlipidemia.  Comparison Study: No previous exams Performing Technologist: Hill, Jody RVT, RDMS  Examination Guidelines: A complete evaluation includes at minimum, Doppler waveform signals and systolic blood pressure reading at the level of bilateral brachial, anterior tibial, and posterior tibial arteries, when vessel segments are accessible. Bilateral testing is considered an integral part of a complete examination. Photoelectric Plethysmograph (PPG) waveforms and toe systolic pressure readings are included as required and additional duplex testing as needed. Limited examinations for reoccurring indications may be performed as noted.  ABI Findings: +---------+------------------+-----+---------+------------------+ Right    Rt Pressure (mmHg)IndexWaveform Comment            +---------+------------------+-----+---------+------------------+ Brachial 132                    triphasic                   +---------+------------------+-----+---------+------------------+ PTA      154               1.12 triphasic                   +---------+------------------+-----+---------+------------------+ DP       160                1.17 triphasic                   +---------+------------------+-----+---------+------------------+ Great Toe  Normal   too large for cuff +---------+------------------+-----+---------+------------------+ +---------+------------------+-----+---------+-------+ Left     Lt Pressure (mmHg)IndexWaveform Comment +---------+------------------+-----+---------+-------+ Brachial 137                    triphasic        +---------+------------------+-----+---------+-------+ PTA      143               1.04 triphasic        +---------+------------------+-----+---------+-------+ DP       143               1.04 triphasic        +---------+------------------+-----+---------+-------+ [place]               1.00 Normal           +---------+------------------+-----+---------+-------+ +-------+-----------+-----------+------------+------------+ ABI/TBIToday's ABIToday's TBIPrevious ABIPrevious TBI +-------+-----------+-----------+------------+------------+ Right  1.17                                           +-------+-----------+-----------+------------+------------+ Left   1.04       1.00                                +-------+-----------+-----------+------------+------------+  Unable to obtain TBI on right side due to size of toe.  Summary: Right: Resting right ankle-brachial index is within normal range. Left: Resting left ankle-brachial index is within normal range. The left toe-brachial index is normal. *See table(s) above for measurements and observations.  Electronically signed by Harold Barban MD on 08/01/2022 at 10:30:09 PM.    Final     Scheduled Meds:  allopurinol  300 mg Oral Daily   ARIPiprazole  5 mg Oral Daily   atorvastatin  40 mg Oral QHS   cycloSPORINE  1 drop Both Eyes BID   DULoxetine  60 mg Oral BID   enoxaparin (LOVENOX) injection  40 mg Subcutaneous Q24H   gabapentin  300 mg Oral TID   loratadine  10 mg Oral Daily    melatonin  5 mg Oral QHS   metoprolol tartrate  50 mg Oral BID   multivitamin  1 tablet Oral Daily   nortriptyline  100 mg Oral q1800   OXcarbazepine  150 mg Oral BID   pantoprazole  40 mg Oral QHS   Continuous Infusions:  ceFEPime (MAXIPIME) IV 2 g (08/03/22 1638)   vancomycin 1,000 mg (08/02/22 1356)     LOS: 3 days   Shelly Coss, MD [facility] P9/06/2022, 10:35 AM

## 2022-08-03 NOTE — Progress Notes (Signed)
Bone scan results reivewed with patient, concern for osteomyelitis. Plan for bone biopsy and screw removal tonight. NPO. CHG cloth bathing order placed. Consent ordered   [name], DPM 08/03/2022

## 2022-08-03 NOTE — Anesthesia Procedure Notes (Signed)
Procedure Name: MAC Date/Time: 08/03/2022 5:47 PM  Performed by: Renato Shin, CRNAPre-anesthesia Checklist: Patient identified, Emergency Drugs available, Suction available and Patient being monitored Patient Re-evaluated:Patient Re-evaluated prior to induction Oxygen Delivery Method: Simple face mask Preoxygenation: Pre-oxygenation with 100% oxygen Induction Type: IV induction Placement Confirmation: positive ETCO2 and breath sounds checked- equal and bilateral Dental Injury: Teeth and Oropharynx as per pre-operative assessment

## 2022-08-03 NOTE — Care Management Important Message (Signed)
Important Message  Patient Details IM Letter given to the Patient. Name: [name] MRN: [id number] Date of Birth: 10/25/1956   Medicare Important Message Given:  Yes     Kerin Salen 08/03/2022, 10:31 AM

## 2022-08-03 NOTE — Telephone Encounter (Signed)
Patient advised Rx being sent to pharmacy.   Next Visit: 12/10/2022  Last Visit: 06/06/2022  Last Fill: 07/16/2022 (30 day supply)  DX: Age-related osteoporosis with current pathological fracture, initial encounter  Current Dose per office note 06/06/2022: dose not discussed.  Labs: 08/02/2022 CBC WNL 08/03/2022 Calcium 8.4  Okay to refill Forteo?

## 2022-08-03 NOTE — H&P (Signed)
History and Physical Interval Note:  08/03/2022 5:35 PM  [name]  has presented today for surgery, with the diagnosis of right foot infection.  The various methods of treatment have been discussed with the patient and family. After consideration of risks, benefits and other options for treatment, the patient has consented to   Procedure(s): foot bone biopsy and hardware removal (Right) as a surgical intervention.  The patient's history has been reviewed, patient examined, no change in status, stable for surgery.  I have reviewed the patient's chart and labs.  Questions were answered to the patient's satisfaction.     Criselda Peaches

## 2022-08-03 NOTE — Brief Op Note (Signed)
07/30/2022 - 08/03/2022  6:27 PM  PATIENT:  [name]  66 y.o. female  PRE-OPERATIVE DIAGNOSIS:  infection  POST-OPERATIVE DIAGNOSIS:  infection  PROCEDURE:  Procedure(s): foot bone biopsy and hardware removal (Right)  SURGEON:  Surgeon(s) and Role:    * Lyndol Vanderheiden, Stephan Minister, DPM - Primary   ASSISTANTS: Dr Celesta Gentile DPM    ANESTHESIA:   local and MAC  EBL:  15 mL   BLOOD ADMINISTERED:none  DRAINS: none   LOCAL MEDICATIONS USED:  NONE  SPECIMEN:  culture of joint fluid, culture of fluid around screws, distal phalanx bone biopsy and culture, proximal phalanx biopsy and culture  DISPOSITION OF SPECIMEN:  pathology and microbiology respectively   COUNTS:  YES  TOURNIQUET:  21 minutes ankle  DICTATION: .Note written in EPIC  PLAN OF CARE: Admit to inpatient   PATIENT DISPOSITION:  PACU - hemodynamically stable.   Delay start of Pharmacological VTE agent (>24hrs) due to surgical blood loss or risk of bleeding: no

## 2022-08-03 NOTE — Anesthesia Procedure Notes (Signed)
Anesthesia Regional Block: Ankle block   Pre-Anesthetic Checklist: , timeout performed,  Correct Patient, Correct Site, Correct Laterality,  Correct Procedure, Correct Position, site marked,  Risks and benefits discussed,  Surgical consent,  Pre-op evaluation,  At surgeon's request and post-op pain management  Laterality: Right  Prep: chloraprep       Needles:  Injection technique: Single-shot  Needle Type: Other     Needle Length: 4cm  Needle Gauge: 25     Additional Needles:   Narrative:  Start time: 08/03/2022 4:50 PM End time: 08/03/2022 4:55 PM Injection made incrementally with aspirations every 5 mL.  Performed by: Personally  Anesthesiologist: Myrtie Soman, MD  Additional Notes: Patient tolerated the procedure well without complications

## 2022-08-03 NOTE — Telephone Encounter (Unsigned)
Patient left a voicemail requesting prescription refill for Forteo.  Patient states she called the [facility] and was told when they received the prescription last month it was only for 1 month.  Patient states she only as enough medication for the next 2-3 days.  Patient states she is in the hospital and is concerned about getting her refill and requested a return call ASAP on her cell [phone]

## 2022-08-03 NOTE — Anesthesia Preprocedure Evaluation (Addendum)
Anesthesia Evaluation  Patient identified by MRN, date of birth, ID band Patient awake    Reviewed: Allergy & Precautions, NPO status , Patient's Chart, lab work & pertinent test results  History of Anesthesia Complications (+) PONV and history of anesthetic complications  Airway Mallampati: II  TM Distance: >3 FB Neck ROM: Full    Dental no notable dental hx.    Pulmonary sleep apnea and Continuous Positive Airway Pressure Ventilation ,    Pulmonary exam normal breath sounds clear to auscultation       Cardiovascular hypertension, Normal cardiovascular exam Rhythm:Regular Rate:Normal     Neuro/Psych Bipolar Disorder negative neurological ROS     GI/Hepatic Neg liver ROS, GERD  ,gastroparesis   Endo/Other  Morbid obesity  Renal/GU negative Renal ROS  negative genitourinary   Musculoskeletal  (+) Arthritis ,   Abdominal   Peds negative pediatric ROS (+)  Hematology negative hematology ROS (+)   Anesthesia Other Findings   Reproductive/Obstetrics negative OB ROS                             Anesthesia Physical Anesthesia Plan  ASA: 3  Anesthesia Plan: MAC   Post-op Pain Management: Regional block*   Induction: Intravenous  PONV Risk Score and Plan: Propofol infusion, Ondansetron and Treatment may vary due to age or medical condition  Airway Management Planned: Simple Face Mask  Additional Equipment:   Intra-op Plan:   Post-operative Plan:   Informed Consent: I have reviewed the patients History and Physical, chart, labs and discussed the procedure including the risks, benefits and alternatives for the proposed anesthesia with the patient or authorized representative who has indicated his/her understanding and acceptance.     Dental advisory given  Plan Discussed with: CRNA and Surgeon  Anesthesia Plan Comments: (Ankle block performed)       Anesthesia Quick  Evaluation

## 2022-08-04 ENCOUNTER — Encounter (HOSPITAL_COMMUNITY): Payer: Self-pay | Admitting: Podiatry

## 2022-08-04 DIAGNOSIS — M14671 Charcot's joint, right ankle and foot: Secondary | ICD-10-CM

## 2022-08-04 DIAGNOSIS — M14679 Charcot's joint, unspecified ankle and foot: Secondary | ICD-10-CM

## 2022-08-04 DIAGNOSIS — M1A9XX0 Chronic gout, unspecified, without tophus (tophi): Secondary | ICD-10-CM | POA: Diagnosis not present

## 2022-08-04 DIAGNOSIS — T847XXA Infection and inflammatory reaction due to other internal orthopedic prosthetic devices, implants and grafts, initial encounter: Secondary | ICD-10-CM | POA: Diagnosis not present

## 2022-08-04 DIAGNOSIS — M869 Osteomyelitis, unspecified: Secondary | ICD-10-CM | POA: Diagnosis not present

## 2022-08-04 DIAGNOSIS — G629 Polyneuropathy, unspecified: Secondary | ICD-10-CM

## 2022-08-04 DIAGNOSIS — M79671 Pain in right foot: Secondary | ICD-10-CM | POA: Diagnosis not present

## 2022-08-04 DIAGNOSIS — Z8619 Personal history of other infectious and parasitic diseases: Secondary | ICD-10-CM

## 2022-08-04 DIAGNOSIS — M86171 Other acute osteomyelitis, right ankle and foot: Secondary | ICD-10-CM

## 2022-08-04 LAB — BASIC METABOLIC PANEL
Anion gap: 7 (ref 5–15)
BUN: 16 mg/dL (ref 8–23)
CO2: 28 mmol/L (ref 22–32)
Calcium: 8.8 mg/dL — ABNORMAL LOW (ref 8.9–10.3)
Chloride: 108 mmol/L (ref 98–111)
Creatinine, Ser: 0.68 mg/dL (ref 0.44–1.00)
GFR, Estimated: >60 mL/min (ref >60)
Glucose, Bld: 85 mg/dL (ref 70–99)
Potassium: 4 mmol/L (ref 3.5–5.1)
Sodium: 143 mmol/L (ref 135–145)

## 2022-08-04 LAB — CK: Total CK: 57 U/L (ref 38–234)

## 2022-08-04 MED ORDER — FLUCONAZOLE 150 MG PO TABS
150.0000 mg | ORAL_TABLET | Freq: Once | ORAL | Status: AC
  Administered 2022-08-04: 150 mg via ORAL
  Filled 2022-08-04: qty 1

## 2022-08-04 MED ORDER — CLOTRIMAZOLE 2 % VA CREA
1.0000 | TOPICAL_CREAM | Freq: Every day | VAGINAL | Status: DC
Start: 2022-08-04 — End: 2022-08-04

## 2022-08-04 MED ORDER — OXYCODONE-ACETAMINOPHEN 7.5-325 MG PO TABS
1.0000 | ORAL_TABLET | Freq: Four times a day (QID) | ORAL | Status: DC | PRN
  Administered 2022-08-04 – 2022-08-08 (×11): 1 via ORAL
  Filled 2022-08-04 (×11): qty 1

## 2022-08-04 MED ORDER — SODIUM CHLORIDE 0.9 % IV SOLN
8.0000 mg/kg | Freq: Every day | INTRAVENOUS | Status: DC
  Administered 2022-08-05: 700 mg via INTRAVENOUS
  Filled 2022-08-04: qty 14

## 2022-08-04 NOTE — Op Note (Signed)
Patient Name: [name] DOB: [birth year]  MRN: [id number]   Date of Service: 07/30/2022 - 08/03/2022  Surgeon: Dr. [name], DPM Assistants: None Pre-operative Diagnosis:  #1 osteomyelitis of right great toe #2 infection of implanted hardware right great toe #3 possible Charcot arthropathy of right great toe Post-operative Diagnosis:  #1 osteomyelitis of right great toe #2 infection of implanted hardware right great toe #3 possible Charcot arthropathy of right great toe Procedures:  1) removal implant deep right great toe  2) bone biopsy open deep right great toe  3) irrigation and lavage of surgical site  Pathology/Specimens: ID Type Source Tests Collected by Time Destination  1 : distal bone phalanx bone biopsy right great toe Tissue PATH Bone biopsy SURGICAL PATHOLOGY Criselda Peaches, DPM 08/03/2022 1824   2 : proximal bone phalanx bone biopsy right great toe Tissue PATH Bone biopsy SURGICAL PATHOLOGY Criselda Peaches, DPM 08/03/2022 1826   A : righ t great big toe joint fluid culture Body Fluid Wound AEROBIC/ANAEROBIC CULTURE W GRAM STAIN (SURGICAL/DEEP WOUND), BODY FLUID CULTURE W GRAM STAIN (Canceled) Criselda Peaches, DPM 08/03/2022 1759   B : hardware right great big toe Body Fluid Wound AEROBIC/ANAEROBIC CULTURE W GRAM STAIN (SURGICAL/DEEP WOUND) Criselda Peaches, DPM 08/03/2022 1807   C : distal phalanx bone culture Wound Wound AEROBIC/ANAEROBIC CULTURE W GRAM STAIN (SURGICAL/DEEP WOUND) Criselda Peaches, DPM 08/03/2022 1815   D : proximal phalanx bone culture Wound Wound AEROBIC/ANAEROBIC CULTURE W GRAM STAIN (SURGICAL/DEEP WOUND) Criselda Peaches, DPM 08/03/2022 1820    Anesthesia: Monitored anesthesia care with local field block by the anesthesiologist Hemostasis:  Total Tourniquet Time Documented: Ankle (laterality) - 21 minutes Total: Ankle (laterality) - 21 minutes  Estimated Blood Loss: 15 mL Materials: * No implants in log * Medications: None given Complications: No  complications were noted  Indications for Procedure:  This is a 66 y.o. female with a history of peripheral neuropathy, previous gout, and arthropathy of the right great toe.  She previously had undergone biopsy in 2022 of the right great toe which was negative for osteomyelitis.  Due to continued arthropathy and pain she subsequently underwent arthrodesis of the right hallux IPJ with myself on 06/29/2022.  She developed persistent swelling and erythema to the right great toe.  Her incision did heal.  She was placed on antibiotics and her condition waxed and waned.  Aspiration of the arthrodesis site joint fluid in the office did not yield any bacterial growth.  She continued to have persistent symptoms and she was admitted for the presumption of osteomyelitis.  She underwent bone scan which was concerning for an inflammatory or infectious process.  She presents today for biopsy and removal of the implanted hardware.   Procedure in Detail: Patient was identified in pre-operative holding area. Formal consent was signed and the right lower extremity was marked. Patient was brought back to the operating room. Anesthesia was induced. The extremity was prepped and draped in the usual sterile fashion. Timeout was taken to confirm patient name, laterality, and procedure prior to incision.   Attention was then directed to the right great toe where the previous incision was opened and dissection was carried deep through subcutaneous tissue, the repaired tendon was tenotomized once more, the arthrodesis site was entered and there was serosanguineous fluid at this site.  This joint fluid was cultured.  There was no gross purulence.  The bone and soft tissue did not appear to be necrotic and did not  appear to be viable.  I then proceeded to make separate incisions into the distal tip of the phalanx to remove the screws.  These were removed without complication.  The screw sites were cultured as well.  I then proceeded to  obtain biopsy and culture of the bone of the distal phalanx and proximal phalanx respectively.  These were sent to pathology and microbiology respectively.  The joint was then thoroughly irrigated with normal sterile saline.  It was then closed in layers with 3-0 Monocryl and 3-0 nylon.  The foot was then dressed with Xeroform and dry sterile dressings. Patient tolerated the procedure well.   Disposition: Following a period of post-operative monitoring, patient will be transferred to the floor.  I recommended that she remain inpatient until her biopsy results are back.  If they are negative for osteomyelitis and we will plan to treat conservatively for Charcot arthropathy.  If the biopsies are positive for osteomyelitis then we will have further discussions with her of long-term antibiotics and/or amputation of the toe.  She remains at risk of limb loss.  I discussed the above with the patient and her husband following the surgery.

## 2022-08-04 NOTE — Progress Notes (Signed)
Pharmacy Antibiotic Note  [name] is a 66 y.o. female admitted on 07/30/2022 with  wound infection  - complications to R big toe surgical site. Recently on doxy/levofloxacin/cefadroxil PTA. Imaging concerning for osteomyelitis/septic arthritis; s/p foot bone biopsy, hardware removal on 9/8.[facility] has been consulted to transition to daptomycin per ID.  Today, 08/04/2022 Day #5 vancomycin/cefepime Afebrile WBC 9.4 (9/7) SCr 0.68, stable  Plan: Daptomycin '8mg'$ /kg IV q24h - start 9/10 since already received vancomycin today Check baseline ck now, then weekly Follow up renal function & cultures  Height: 5' (152.4 cm) Weight: 85.3 kg (188 lb) IBW/kg (Calculated) : 45.5  Temp (24hrs), Avg:98 F (36.7 C), Min:97.5 F (36.4 C), Max:98.4 F (36.9 C)  Recent Labs  Lab 07/30/22 1232 07/31/22 0317 08/01/22 0329 08/02/22 0341 08/03/22 0436 08/04/22 0313  WBC 8.6 9.0 7.5 9.4  --   --   CREATININE 0.86 0.78 0.82 0.80 0.69 0.68  LATICACIDVEN 1.4  --   --   --   --   --      Estimated Creatinine Clearance: 68 mL/min (by C-G formula based on SCr of 0.68 mg/dL).    Allergies  Allergen Reactions   Erythromycin Nausea Only and Other (See Comments)    Abdominal pain, also   Meperidine Hcl Other (See Comments)    Made the patient feel hot and her heart raced- made her feel "flushed," also   Polyethyl Glyc-Propyl Glyc Pf Nausea And Vomiting and Other (See Comments)    RN confirmed with patient 9/4 (Miralax)   Dilaudid [Hydromorphone Hcl] Itching   Antimicrobials this admission:  9/5 vancomycin >> 9/9 9/4 cefepime >> 9/9 9/9 daptomycin >>  Dose adjustments this admission:    Microbiology results:  9/6 BCx x2:  9/8 wound, R hardware great big toe:  9/8 wound, proximal phalnx bone: 9/8 wound: distal phalanx bone: 9/8 wound, right great big toe joint fluid: rare GPC    Peggyann Juba, PharmD, BCPS Pharmacy: [phone] 08/04/2022 3:11 PM

## 2022-08-04 NOTE — Progress Notes (Signed)
Orthopedic Tech Progress Note Patient Details:  [name] November 24, 1956 [id number]  Ortho Devices Type of Ortho Device: Postop shoe/boot Ortho Device/Splint Location: RLE Ortho Device/Splint Interventions: Ordered, Application   Post Interventions Patient Tolerated: Well Instructions Provided: Care of device  [name] 08/04/2022, 12:49 PM

## 2022-08-04 NOTE — Progress Notes (Signed)
PROGRESS NOTE  [name]  CZY:[id number] DOB: [birth year] DOA: 07/30/2022 PCP: Rita Ohara, MD   Brief Narrative:  Patient is a 66 year old female with history of depression, anxiety, chronic fatigue syndrome, degenerative disc disease, fibromyalgia, gastroparesis, GERD, hyperlipidemia, lumbar radiculopathy, neuropathy, OSA who presented with complaints of right great toe pain, swelling.  Patient recently had surgery on the toe last month but for the last couple weeks, she developed increased pain and swelling and followed-up with podiatry and infectious disease and was on antibiotics.  Since the wound was not healing, she was directed  to the emergency department by podiatrist.  X-ray showed increased lucency/erosions  around the great toe interphalangeal joint including increased lucency adjacent to the surgical screws.  Concern for osteomyelitis/osteoarthritis.  Started on broad-spectrum antibiotics.  Bone scan confirmed osteomyelitis.  Underwent foot bone biopsy, hardware removal on 9/8.  ID also consulted today.  Assessment & Plan:  Principal Problem:   Right foot pain Active Problems:   Mixed hyperlipidemia   OSA (obstructive sleep apnea)   Gout   Peripheral polyneuropathy   Osteomyelitis (HCC)   Bipolar disorder (HCC)  Right great toe pain: History as above.  No significant erythema or purulence noted on admission.  There was edema, tenderness on the local site.  Concern for Charcot arthropathy.  Images findings as above.  Started on broad spectrum antibiotics.  Blood cultures,NGTD. Bone scan showed hyperemia to the RIGHT foot and focal accumulation of tagged white blood cells in the proximal phalanx of the first RIGHT digit suggesting infectious/inflammatory process including osteomyelitis. Underwent foot bone biopsy, hardware removal on 9/8.  ID also consulted today.  Wound cultures has not shown any growth, no organisms seen.  Idiopathic chronic gout: On allopurinol  Bipolar  disorder/anxiety/depression: On Abilify, Cymbalta, nortriptyline, Trileptal, Xanax  Hyperlipidemia: On Lipitor  OSA: CPAP at night  Neuropathy: On Gabapentin  Nausea: Continue antiemetics  Obesity: BMI of 36.7       DVT prophylaxis:enoxaparin (LOVENOX) injection 40 mg Start: 07/30/22 2000     Code Status: Full Code  Family Communication: Husband at bedside on 9/6  Patient status:Inpatient  Patient is from :Home  Anticipated discharge UX:NATF  Estimated DC date:1-2 days, awaiting further management by podiatry/ID  Consultants: Podiatry  Procedures:None  Antimicrobials:  Anti-infectives (From admission, onward)    Start     Dose/Rate Route Frequency Ordered Stop   08/01/22 1200  vancomycin (VANCOCIN) IVPB 1000 mg/200 mL premix        1,000 mg 200 mL/hr over 60 Minutes Intravenous Every 24 hours 07/31/22 1124     07/31/22 1630  vancomycin (VANCOCIN) IVPB 1000 mg/200 mL premix  Status:  Discontinued        1,000 mg 200 mL/hr over 60 Minutes Intravenous Every 24 hours 07/30/22 1602 07/30/22 1604   07/31/22 1215  vancomycin (VANCOREADY) IVPB 1750 mg/350 mL        1,750 mg 175 mL/hr over 120 Minutes Intravenous  Once 07/31/22 1117 07/31/22 1457   07/30/22 1615  vancomycin (VANCOCIN) IVPB 1000 mg/200 mL premix  Status:  Discontinued       See Hyperspace for full Linked Orders Report.   1,000 mg 200 mL/hr over 60 Minutes Intravenous  Once 07/30/22 1602 07/30/22 1604   07/30/22 1615  vancomycin (VANCOCIN) IVPB 1000 mg/200 mL premix  Status:  Discontinued       See Hyperspace for full Linked Orders Report.   1,000 mg 200 mL/hr over 60 Minutes Intravenous  Once 07/30/22 1602  07/30/22 1604   07/30/22 1604  vancomycin (VANCOCIN) IVPB 1000 mg/200 mL premix  Status:  Discontinued        1,000 mg 200 mL/hr over 60 Minutes Intravenous Every 24 hours 07/30/22 1604 07/31/22 1117   07/30/22 1600  ceFEPIme (MAXIPIME) 2 g in sodium chloride 0.9 % 100 mL IVPB        2 g 200 mL/hr  over 30 Minutes Intravenous Every 8 hours 07/30/22 1555     07/30/22 1600  vancomycin (VANCOREADY) IVPB 1500 mg/300 mL  Status:  Discontinued        1,500 mg 150 mL/hr over 120 Minutes Intravenous  Once 07/30/22 1555 07/30/22 1602       Subjective:  Patient seen and examined the bedside today.  Hemodynamically stable.  Nausea is better today.  No new complaints  Objective: Vitals:   08/03/22 2208 08/04/22 0216 08/04/22 0546 08/04/22 0910  BP: 110/60 120/60 115/60 (!) 116/58  Pulse: 68 63 66 78  Resp: '16 18 16 18  '$ Temp: 98.4 F (36.9 C) 97.8 F (36.6 C) 98 F (36.7 C) 98.2 F (36.8 C)  TempSrc: Oral  Axillary Oral  SpO2: 94% 98% 96% 93%  Weight:      Height:        Intake/Output Summary (Last 24 hours) at 08/04/2022 1111 Last data filed at 08/04/2022 0555 Gross per 24 hour  Intake 1730 ml  Output 65 ml  Net 1665 ml   Filed Weights   07/30/22 1219  Weight: 85.3 kg    Examination:  General exam: Overall comfortable, not in distress,obese HEENT: PERRL Respiratory system:  no wheezes or crackles  Cardiovascular system: S1 & S2 heard, RRR.  Gastrointestinal system: Abdomen is nondistended, soft and nontender. Central nervous system: Alert and oriented Extremities: No edema, no clubbing ,no cyanosis, right foot covered with dressing Skin: No rashes, no ulcers,no icterus     Data Reviewed: I have personally reviewed following labs and imaging studies  CBC: Recent Labs  Lab 07/30/22 1232 07/31/22 0317 08/01/22 0329 08/02/22 0341  WBC 8.6 9.0 7.5 9.4  NEUTROABS 5.1  --  4.0 5.5  HGB 12.7 12.6 11.8* 12.3  HCT 38.4 38.8 37.2 38.4  MCV 91.4 93.5 94.2 93.0  PLT 301 278 278 102   Basic Metabolic Panel: Recent Labs  Lab 07/31/22 0317 08/01/22 0329 08/02/22 0341 08/03/22 0436 08/04/22 0313  NA 140 141 142 141 143  K 4.1 3.8 3.4* 3.6 4.0  CL 105 106 108 110 108  CO2 '27 29 27 26 28  '$ GLUCOSE 114* 104* 101* 94 85  BUN '17 15 17 15 16  '$ CREATININE 0.78 0.82 0.80  0.69 0.68  CALCIUM 8.7* 8.8* 9.0 8.4* 8.8*  MG  --  2.0 2.1  --   --      Recent Results (from the past 240 hour(s))  Culture, blood (Routine X 2) w Reflex to ID Panel     Status: None (Preliminary result)   Collection Time: 08/01/22  9:44 AM   Specimen: BLOOD LEFT ARM  Result Value Ref Range Status   Specimen Description   Final    BLOOD LEFT ARM Performed at [facility], [place] [address], [place], [place] [address]    Special Requests   Final    BOTTLES DRAWN AEROBIC ONLY Blood Culture adequate volume Performed at [facility] [address], [place], [place] [address]    Culture   Final    NO GROWTH  3 DAYS Performed at [facility], [place] [address] [place], [place] [address]    Report Status PENDING  Incomplete  Culture, blood (Routine X 2) w Reflex to ID Panel     Status: None (Preliminary result)   Collection Time: 08/01/22 10:28 AM   Specimen: BLOOD  Result Value Ref Range Status   Specimen Description   Final    BLOOD BLOOD LEFT HAND Performed at [facility] [address], [place], [place] [address]    Special Requests   Final    Blood Culture adequate volume AEROBIC BOTTLE ONLY Performed at [facility] [address], [place], [place] [address]    Culture   Final    NO GROWTH 3 DAYS Performed at [facility], [place] [address] [place], [place] [address]    Report Status PENDING  Incomplete  Surgical pcr screen     Status: None   Collection Time: 08/03/22  2:09 PM   Specimen: Nasal Mucosa; Nasal Swab  Result Value Ref Range Status   MRSA, PCR NEGATIVE NEGATIVE Final   Staphylococcus aureus NEGATIVE NEGATIVE Final    Comment: (NOTE) The Xpert SA Assay (FDA approved for NASAL specimens in patients 42 years of age and older), is one component of a comprehensive surveillance program. It is not intended to diagnose infection nor to guide or monitor treatment. Performed at  [facility], [place] [address] [place], [place] [address]   Aerobic/Anaerobic Culture w Gram Stain (surgical/deep wound)     Status: None (Preliminary result)   Collection Time: 08/03/22  5:59 PM   Specimen: Wound; Body Fluid  Result Value Ref Range Status   Specimen Description   Final    WOUND Performed at [facility] [address], [place], [place] [address]    Special Requests   Final    RIGHT GREAT BIG TOE JOINT FLUID Performed at [facility] [address] [place], [place] [address]    Gram Stain NO WBC SEEN RARE GRAM POSITIVE COCCI   Final   Culture   Final    NO GROWTH < 24 HOURS Performed at [facility], [place] [address] [place], [place] [address]    Report Status PENDING  Incomplete  Aerobic/Anaerobic Culture w Gram Stain (surgical/deep wound)     Status: None (Preliminary result)   Collection Time: 08/03/22  6:07 PM   Specimen: Wound; Body Fluid  Result Value Ref Range Status   Specimen Description   Final    WOUND Performed at [facility] [address], [place], [place] [address]    Special Requests   Final    HARDWARE RIGHT GREAT BIG TOE Performed at [facility] [address], [place], [place] [address]    Gram Stain NO ORGANISMS SEEN NO WBC SEEN   Final   Culture   Final    NO GROWTH < 24 HOURS Performed at [facility], [place] [address], [place], [place] [address]    Report Status PENDING  Incomplete  Aerobic/Anaerobic Culture w Gram Stain (surgical/deep wound)     Status: None (Preliminary result)   Collection Time: 08/03/22  6:15 PM   Specimen: Wound  Result Value Ref Range Status   Specimen Description   Final    WOUND Performed at [facility] [address] [place], [place] [address]    Special Requests   Final  NONE DISTAL PHALANX BONE Performed at [facility], [place] [address],  [place], [place] [address]    Gram Stain NO ORGANISMS SEEN NO WBC SEEN   Final   Culture   Final    NO GROWTH < 24 HOURS Performed at [facility], [place] [address], [place], [place] [address]    Report Status PENDING  Incomplete  Aerobic/Anaerobic Culture w Gram Stain (surgical/deep wound)     Status: None (Preliminary result)   Collection Time: 08/03/22  6:20 PM   Specimen: Wound  Result Value Ref Range Status   Specimen Description   Final    WOUND Performed at [facility] [address], [place], [place] [address]    Special Requests   Final    NONE PROXIMAL PHALANX BONE Performed at [facility], [place] [address] [place], [place] [address]    Gram Stain NO ORGANISMS SEEN NO WBC SEEN   Final   Culture   Final    NO GROWTH < 24 HOURS Performed at [facility], [place] [address], [place], [place] [address]    Report Status PENDING  Incomplete     Radiology Studies: NM WBC SCAN TUMOR LOC LIMITED  Result Date: 08/03/2022 CLINICAL DATA:  Suspicion of osteomyelitis. EXAM: NUCLEAR MEDICINE LEUKOCYTE SCAN TECHNIQUE: Following intravenous administration of radiolabeled white blood cells, images of the head, neck, trunk, and extremities were obtained on subsequent days. RADIOPHARMACEUTICALS:  30.6 technetium 99 Ceretec labeled autologous leukocytes IV COMPARISON:  Radiograph 07/30/2022 FINDINGS: There is hyperemia to the RIGHT foot on the early arterial phase imaging. Delayed imaging there is focal accumulation of tagged white blood cells in the first ray at the level of the proximal phalanx. IMPRESSION: Hyperemia to the RIGHT foot and focal accumulation of tagged white blood cells in the proximal phalanx of the first RIGHT digit suggests infectious/inflammatory process including osteomyelitis. Electronically Signed   By: Suzy Bouchard M.D.   On: 08/03/2022 08:33    Scheduled Meds:  allopurinol  300 mg Oral Daily   ARIPiprazole  5 mg Oral  Daily   atorvastatin  40 mg Oral QHS   Chlorhexidine Gluconate Cloth  6 each Topical Daily   cycloSPORINE  1 drop Both Eyes BID   DULoxetine  60 mg Oral BID   enoxaparin (LOVENOX) injection  40 mg Subcutaneous Q24H   gabapentin  300 mg Oral TID   loratadine  10 mg Oral Daily   melatonin  5 mg Oral QHS   metoprolol tartrate  50 mg Oral BID   multivitamin  1 tablet Oral Daily   nortriptyline  100 mg Oral q1800   OXcarbazepine  150 mg Oral BID   pantoprazole  40 mg Oral QHS   Continuous Infusions:  ceFEPime (MAXIPIME) IV 2 g (08/04/22 0751)   vancomycin 1,000 mg (08/04/22 1102)     LOS: 4 days   Shelly Coss, MD [facility] P9/07/2022, 11:11 AM

## 2022-08-04 NOTE — Progress Notes (Signed)
PODIATRY PROGRESS NOTE  NAME [name] MRN [id number] DOB Jul 13, 1956 DOA 07/30/2022   Reason for consult:  Chief Complaint  Patient presents with   Toe Pain    Right   Post-op Problem     History of present illness: 66 y.o. female POD #1 s/p right foot hardware removal, bone biopsy.  Doing well this morning.  No fevers or chills.  Vitals:   08/04/22 0546 08/04/22 0910  BP: 115/60 (!) 116/58  Pulse: 66 78  Resp: 16 18  Temp: 98 F (36.7 C) 98.2 F (36.8 C)  SpO2: 96% 93%       Latest Ref Rng & Units 08/02/2022    3:41 AM 08/01/2022    3:29 AM 07/31/2022    3:17 AM  CBC  WBC 4.0 - 10.5 K/uL 9.4  7.5  9.0   Hemoglobin 12.0 - 15.0 g/dL 08.6  57.8  46.9   Hematocrit 36.0 - 46.0 % 38.4  37.2  38.8   Platelets 150 - 400 K/uL 272  278  278        Latest Ref Rng & Units 08/04/2022    3:13 AM 08/03/2022    4:36 AM 08/02/2022    3:41 AM  BMP  Glucose 70 - 99 mg/dL 85  94  629   BUN 8 - 23 mg/dL 16  15  17    Creatinine 0.44 - 1.00 mg/dL 5.28  4.13  2.44   Sodium 135 - 145 mmol/L 143  141  142   Potassium 3.5 - 5.1 mmol/L 4.0  3.6  3.4   Chloride 98 - 111 mmol/L 108  110  108   CO2 22 - 32 mmol/L 28  26  27    Calcium 8.9 - 10.3 mg/dL 8.8  8.4  9.0       Physical Exam: General: AAO x 3, NAD    Dermatology: See pictures below incision well coapted.  There is still chronic appearing edema to the toe.  No drainage approximately fluctuance or crepitation.       Vascular: DP, PT pulses palpable  Neurological: Sensation decreased  Musculoskeletal Exam: No significant pain on exam    ASSESSMENT/PLAN OF CARE  POD # 1 s/p right hallux removal of hardware, bone biopsy  -Culture results are still pending.  Joint culture is growing gram-positive cocci.  Otherwise cultures remain negative.  Pathology is also pending. -Dressing changed today.  Xeroform applied followed by a dry dressing. -Infectious disease consult pending. -Patient today brought up if there is bone  infection she is considering amputation.  We discussed pros and cons of this versus salvaging but I like to await the results of the pathology, culture before making definitive decisions on this. -Podiatry will continue to follow    Please contact me directly with any questions or concerns.     Ovid Curd, DPM [facility]  Dr. Lesia Sago. Irene Collings, DPM    [address] [place], [place] [address]                Office [phone]  Fax [phone]

## 2022-08-04 NOTE — Consult Note (Addendum)
Date of Admission:  07/30/2022          Reason for Consult: Hardware associated osteomyelitis of great toe    Referring Provider: Shelly Coss, MD   Assessment:  Hardware associated osteomyelitis of great toe status post removal of hardware and bone biopsy Possible Charcot arthropathy History of gout Neuropathy of unclear etiology Gastric bypass History of C. difficile colitis  Plan:  I will narrow her to daptomycin for now Follow-up cultures Will ensure she has been screened for HIV and viral hepatitides \  Principal Problem:   Osteomyelitis of great toe of right foot ([place]) Active Problems:   Mixed hyperlipidemia   OSA (obstructive sleep apnea)   Gout   Peripheral polyneuropathy   Bipolar disorder (HCC)   Right foot pain   Infected orthopedic implant (HCC)   Charcot's arthropathy of forefoot   Scheduled Meds:  allopurinol  300 mg Oral Daily   ARIPiprazole  5 mg Oral Daily   atorvastatin  40 mg Oral QHS   Chlorhexidine Gluconate Cloth  6 each Topical Daily   cycloSPORINE  1 drop Both Eyes BID   DULoxetine  60 mg Oral BID   enoxaparin (LOVENOX) injection  40 mg Subcutaneous Q24H   gabapentin  300 mg Oral TID   loratadine  10 mg Oral Daily   melatonin  5 mg Oral QHS   metoprolol tartrate  50 mg Oral BID   multivitamin  1 tablet Oral Daily   nortriptyline  100 mg Oral q1800   OXcarbazepine  150 mg Oral BID   pantoprazole  40 mg Oral QHS   Continuous Infusions:  ceFEPime (MAXIPIME) IV 2 g (08/04/22 0751)   vancomycin 1,000 mg (08/04/22 1102)   PRN Meds:.acetaminophen **OR** acetaminophen, ALPRAZolam, methocarbamol, morphine injection, ondansetron **OR** ondansetron (ZOFRAN) IV, oxyCODONE-acetaminophen, polyvinyl alcohol, prochlorperazine  HPI: [name] is a 66 y.o. female polyarticular gout (sees rheum), bilateral feet numbness/neuropathy in feet unclear causes, OA bilateral hands, hips, s/p total knee arthroplasty, fibromyalgia, chronic fatigue,  obesity, hx gastric bypass roux-en-Y 2020, s/p right big toe arthrodesis for IP joint instability from gout on June 29, 2022 with 2 screws.  By 10 August there was concern about a postsurgical site infection and the patient was given cephalexin.  Films and shown some fractures and erosions in the distal phalanx and some hardware loosening.  Abrasion of the surgical site was performed on the 24th and failed to yield an organism.  This was while the patient was on exam.  She was then placed on doxycycline and levofloxacin and seen by my partner Dr. Gale Journey. Dr Gale Journey reasoned that the organism listed and susceptible to cephalexin and therefore he switched the patient over from doxycycline and levofloxacin to cefadroxil.  The patient was taking this though my partner Dr. Gearldine Shown was not completely convinced that she was was suffering from an infectious process.  That she came off of the cefadroxil but her toe began swelling and she was instructed by the on-call physician to resume it.  She was taking it up until her recent admission here to [facility].  X-ray had showed increasing lucency and further erosive changes around the great toe interphalangeal joint in particular lucency adjacent the surgical screws which were all concerning for progressive osteomyelitis septic arthritis..  Patient was admitted to hospitalist service.  Currently admitting physician spoke with our team Not sure with him it wise.  The patient was started on broad antibiotics than we typically would  have recommended with vancomycin and cefepime having been initiated.  Since then she has also had a tagged white blood cell scan hyperemia in the right foot with a focal cannulation to have white cells in the proximal phalanx of the first right digit.  She has now undergone surgery with Dr. Sherryle Lis with removal of the implant on the right great toe with bone biopsy and irrigation and lavage of surgical site.  Initial Gram  stain shows some gram-positive cocci though no organisms grown yet.  I will narrow her to daptomycin.  I would think that the culprit organism would still be something that would be treatable with a first generation cephalosporin given the negative culture from prior aspirate.  That being said she has not been on vancomycin for a few days prior to surgery and is certainly possible that because Staph epidermidis could be a culprit organism here.  Therefore I will place her on daptomycin.  He is apparently contemplating whether or not to have amputation of the toe and having conversations with Dr. Jacqualyn Posey. Path report is not yet back.   We discussed po vs IV antibiotics that we would use she is considering the different options.  Our preference would be to go with an IV antibiotic.  History of gastric bypass she also has a history of C. difficile colitis so we certainly would want to be careful about not giving her unnecessarily broader high risk antibiotics.  I will follow-up on her cultures and see her tomorrow and my partner Dr.  Linward Foster will be here on Monday.    I spent 81 minutes with the patient including than 50% of the time in face to face counseling of the patient guarding nature of osteomyelitis and her clinical course, personally reviewing films of the foot as well as tagged white blood cell along with review of medical records in preparation for the visit and during the visit and in coordination of her care.   Review of Systems: Review of Systems  Constitutional:  Negative for chills, fever, malaise/fatigue and weight loss.  HENT:  Negative for congestion and sore throat.   Eyes:  Negative for blurred vision and photophobia.  Respiratory:  Negative for cough, shortness of breath and wheezing.   Cardiovascular:  Negative for chest pain, palpitations and leg swelling.  Gastrointestinal:  Negative for abdominal pain, blood in stool, constipation, diarrhea, heartburn, melena, nausea  and vomiting.  Genitourinary:  Negative for dysuria, flank pain and hematuria.  Musculoskeletal:  Positive for joint pain. Negative for back pain, falls and myalgias.  Skin:  Negative for itching and rash.  Neurological:  Negative for dizziness, focal weakness, loss of consciousness, weakness and headaches.  Endo/Heme/Allergies:  Does not bruise/bleed easily.  Psychiatric/Behavioral:  Negative for depression and suicidal ideas. The patient does not have insomnia.     Past Medical History:  Diagnosis Date   Anemia    previously followed by Dr. Jamse Arn for anemia and elevated platelets   Anxiety    C. difficile colitis 10/01/2012   treated by [facility]   Chronic fatigue syndrome    Closed wedge compression fracture of T8 vertebra ([place]) 06/2021   DDD (degenerative disc disease), lumbar 08/19/2014   and facet arthroplasty & left lumbar radiculopathy (Dr.Ramos)   Depression    Dyssynergia    dyssynergenic defecation, contributing to fecal incontinence.   Edema    Fibromyalgia    Gastroparesis    followed at [facility]   GERD (gastroesophageal reflux disease)  History of kidney stones    History of vertebral fracture 06/30/2021   Hyperlipidemia    Kidney stone    Lumbar radiculopathy    Migraine    Neuropathy    Obstructive sleep apnea    Does  wear  CPAP   Osteoporosis    Paresthesia    Dr. Everette Rank at [facility]   Pelvic floor dysfunction    pelvic floor dyssynergy   Plantar fasciitis 02/2011   R foot   Pneumonia    2012   PONV (postoperative nausea and vomiting)    pt states has gastroparesis has difficulty taking antibiotics and narcotics has severe nausea and vomiting    Restless leg syndrome    S/P endometrial ablation 08/09/2006   Novasure Ablation   S/P epidural steroid injection 09/20/2014   Dr.Ramos   Tremor    Dr. Everette Rank   Urinary frequency    Urinary incontinence     Social History   Tobacco Use   Smoking status: Never    Passive exposure: Never    Smokeless tobacco: Never  Vaping Use   Vaping Use: Never used  Substance Use Topics   Alcohol use: No    Alcohol/week: 0.0 standard drinks of alcohol   Drug use: No    Family History  Problem Relation Age of Onset   Allergies Mother    Hypertension Mother    Heart disease Mother        possible valve problem - leaking valve   Macular degeneration Mother    Macular degeneration Father    Heart disease Father        pacemaker, CHF   Hypertension Father    Diabetes Father        borderline   Stroke Father 73   Kidney disease Father    Allergies Sister    Asthma Sister    Irritable bowel syndrome Sister    Macular degeneration Sister    Heart disease Maternal Grandmother    Heart disease Maternal Grandfather    Heart disease Paternal Grandmother    Heart disease Paternal Grandfather    Cancer Maternal Aunt        leukemia   Cancer Maternal Aunt    Colon cancer Maternal Aunt        late 60's   CAD Neg Hx    Allergies  Allergen Reactions   Erythromycin Nausea Only and Other (See Comments)    Abdominal pain, also   Meperidine Hcl Other (See Comments)    Made the patient feel hot and her heart raced- made her feel "flushed," also   Polyethyl Glyc-Propyl Glyc Pf Nausea And Vomiting and Other (See Comments)    RN confirmed with patient 9/4 (Miralax)   Dilaudid [Hydromorphone Hcl] Itching    OBJECTIVE: Blood pressure (!) 117/58, pulse 73, temperature 98.2 F (36.8 C), resp. rate 15, height 5' (1.524 m), weight 85.3 kg, last menstrual period 07/27/2006, SpO2 95 %.  Physical Exam Constitutional:      General: She is not in acute distress.    Appearance: Normal appearance. She is well-developed. She is not ill-appearing or diaphoretic.  HENT:     Head: Normocephalic and atraumatic.     Right Ear: Hearing and external ear normal.     Left Ear: Hearing and external ear normal.     Nose: No nasal deformity or rhinorrhea.  Eyes:     General: No scleral icterus.     Conjunctiva/sclera: Conjunctivae normal.  Right eye: Right conjunctiva is not injected.     Left eye: Left conjunctiva is not injected.     Pupils: Pupils are equal, round, and reactive to light.  Neck:     Vascular: No JVD.  Cardiovascular:     Rate and Rhythm: Normal rate and regular rhythm.     Heart sounds: Normal heart sounds, S1 normal and S2 normal. No murmur heard.    No friction rub.  Abdominal:     General: Bowel sounds are normal. There is no distension.     Palpations: Abdomen is soft.     Tenderness: There is no abdominal tenderness.  Musculoskeletal:     Right shoulder: Normal.     Left shoulder: Normal.     Cervical back: Normal range of motion and neck supple.     Right hip: Normal.     Left hip: Normal.     Right knee: Normal.     Left knee: Normal.  Lymphadenopathy:     Head:     Right side of head: No submandibular, preauricular or posterior auricular adenopathy.     Left side of head: No submandibular, preauricular or posterior auricular adenopathy.     Cervical: No cervical adenopathy.     Right cervical: No superficial or deep cervical adenopathy.    Left cervical: No superficial or deep cervical adenopathy.  Skin:    General: Skin is warm and dry.     Coloration: Skin is not pale.     Findings: No abrasion, bruising, ecchymosis, erythema, lesion or rash.     Nails: There is no clubbing.  Neurological:     General: No focal deficit present.     Mental Status: She is alert and oriented to person, place, and time.     Sensory: No sensory deficit.     Coordination: Coordination normal.     Gait: Gait normal.  Psychiatric:        Attention and Perception: She is attentive.        Mood and Affect: Mood normal.        Speech: Speech normal.        Behavior: Behavior normal. Behavior is cooperative.        Thought Content: Thought content normal.        Judgment: Judgment normal.    Foot bandaged  Lab Results Lab Results  Component Value Date    WBC 9.4 08/02/2022   HGB 12.3 08/02/2022   HCT 38.4 08/02/2022   MCV 93.0 08/02/2022   PLT 272 08/02/2022    Lab Results  Component Value Date   CREATININE 0.68 08/04/2022   BUN 16 08/04/2022   NA 143 08/04/2022   K 4.0 08/04/2022   CL 108 08/04/2022   CO2 28 08/04/2022    Lab Results  Component Value Date   ALT 46 (H) 07/31/2022   AST 44 (H) 07/31/2022   ALKPHOS 139 (H) 07/31/2022   BILITOT 0.4 07/31/2022     Microbiology: Recent Results (from the past 240 hour(s))  Culture, blood (Routine X 2) w Reflex to ID Panel     Status: None (Preliminary result)   Collection Time: 08/01/22  9:44 AM   Specimen: BLOOD LEFT ARM  Result Value Ref Range Status   Specimen Description   Final    BLOOD LEFT ARM Performed at [facility] [address], [place], [place] [address]    Special Requests   Final    BOTTLES DRAWN AEROBIC  ONLY Blood Culture adequate volume Performed at [facility] [address], [place], [place] [address]    Culture   Final    NO GROWTH 3 DAYS Performed at [facility], [place] [address] [place], [place] [address]    Report Status PENDING  Incomplete  Culture, blood (Routine X 2) w Reflex to ID Panel     Status: None (Preliminary result)   Collection Time: 08/01/22 10:28 AM   Specimen: BLOOD  Result Value Ref Range Status   Specimen Description   Final    BLOOD BLOOD LEFT HAND Performed at [facility] [address], [place], [place] [address]    Special Requests   Final    Blood Culture adequate volume AEROBIC BOTTLE ONLY Performed at [facility] [address], [place], [place] [address]    Culture   Final    NO GROWTH 3 DAYS Performed at [facility], [place] [address], [place], [place] [address]    Report Status PENDING  Incomplete  Surgical pcr screen     Status: None   Collection Time: 08/03/22  2:09 PM   Specimen: Nasal Mucosa; Nasal Swab  Result  Value Ref Range Status   MRSA, PCR NEGATIVE NEGATIVE Final   Staphylococcus aureus NEGATIVE NEGATIVE Final    Comment: (NOTE) The Xpert SA Assay (FDA approved for NASAL specimens in patients 74 years of age and older), is one component of a comprehensive surveillance program. It is not intended to diagnose infection nor to guide or monitor treatment. Performed at [facility], [place] [address], [place], [place] [address]   Aerobic/Anaerobic Culture w Gram Stain (surgical/deep wound)     Status: None (Preliminary result)   Collection Time: 08/03/22  5:59 PM   Specimen: Wound; Body Fluid  Result Value Ref Range Status   Specimen Description   Final    WOUND Performed at [facility] [address], [place], [place] [address]    Special Requests   Final    RIGHT GREAT BIG TOE JOINT FLUID Performed at [facility] [address] [place], [place] [address]    Gram Stain NO WBC SEEN RARE GRAM POSITIVE COCCI   Final   Culture   Final    NO GROWTH < 24 HOURS Performed at [facility], [place] [address] [place], [place] [address]    Report Status PENDING  Incomplete  Aerobic/Anaerobic Culture w Gram Stain (surgical/deep wound)     Status: None (Preliminary result)   Collection Time: 08/03/22  6:07 PM   Specimen: Wound; Body Fluid  Result Value Ref Range Status   Specimen Description   Final    WOUND Performed at [facility] [address], [place], [place] [address]    Special Requests   Final    HARDWARE RIGHT GREAT BIG TOE Performed at [facility] [address], [place], [place] [address]    Gram Stain NO ORGANISMS SEEN NO WBC SEEN   Final   Culture   Final    NO GROWTH < 24 HOURS Performed at [facility], [place] [address], [place], [place] [address]    Report Status PENDING  Incomplete  Aerobic/Anaerobic Culture w Gram Stain (surgical/deep wound)     Status: None  (Preliminary result)   Collection Time: 08/03/22  6:15 PM   Specimen: Wound  Result Value Ref Range Status  Specimen Description   Final    WOUND Performed at [facility] [address] [place], [place] [address]    Special Requests   Final    NONE DISTAL PHALANX BONE Performed at [facility] [address] [place], [place] [address]    Gram Stain NO ORGANISMS SEEN NO WBC SEEN   Final   Culture   Final    NO GROWTH < 24 HOURS Performed at [facility], [place] [address] [place], [place] [address]    Report Status PENDING  Incomplete  Aerobic/Anaerobic Culture w Gram Stain (surgical/deep wound)     Status: None (Preliminary result)   Collection Time: 08/03/22  6:20 PM   Specimen: Wound  Result Value Ref Range Status   Specimen Description   Final    WOUND Performed at [facility] [address], [place], [place] [address]    Special Requests   Final    NONE PROXIMAL PHALANX BONE Performed at [facility], [place] [address], [place], [place] [address]    Gram Stain NO ORGANISMS SEEN NO WBC SEEN   Final   Culture   Final    NO GROWTH < 24 HOURS Performed at [facility], [place] [address], [place], [place] [address]    Report Status PENDING  Incomplete    Alcide Evener, [place] [facility] [phone] pager  08/04/2022, 3:21 PM

## 2022-08-05 DIAGNOSIS — M86171 Other acute osteomyelitis, right ankle and foot: Secondary | ICD-10-CM | POA: Diagnosis not present

## 2022-08-05 DIAGNOSIS — G629 Polyneuropathy, unspecified: Secondary | ICD-10-CM | POA: Diagnosis not present

## 2022-08-05 DIAGNOSIS — T847XXD Infection and inflammatory reaction due to other internal orthopedic prosthetic devices, implants and grafts, subsequent encounter: Secondary | ICD-10-CM | POA: Diagnosis not present

## 2022-08-05 DIAGNOSIS — M869 Osteomyelitis, unspecified: Secondary | ICD-10-CM | POA: Diagnosis not present

## 2022-08-05 DIAGNOSIS — Z8619 Personal history of other infectious and parasitic diseases: Secondary | ICD-10-CM

## 2022-08-05 DIAGNOSIS — M14679 Charcot's joint, unspecified ankle and foot: Secondary | ICD-10-CM | POA: Diagnosis not present

## 2022-08-05 LAB — BASIC METABOLIC PANEL
Anion gap: 6 (ref 5–15)
BUN: 17 mg/dL (ref 8–23)
CO2: 28 mmol/L (ref 22–32)
Calcium: 8.8 mg/dL — ABNORMAL LOW (ref 8.9–10.3)
Chloride: 109 mmol/L (ref 98–111)
Creatinine, Ser: 0.75 mg/dL (ref 0.44–1.00)
GFR, Estimated: >60 mL/min (ref >60)
Glucose, Bld: 101 mg/dL — ABNORMAL HIGH (ref 70–99)
Potassium: 4 mmol/L (ref 3.5–5.1)
Sodium: 143 mmol/L (ref 135–145)

## 2022-08-05 LAB — CK: Total CK: 45 U/L (ref 38–234)

## 2022-08-05 MED ORDER — ORAL CARE MOUTH RINSE: 15.0000 mL | OROMUCOSAL | Status: DC | PRN

## 2022-08-05 MED ORDER — DIPHENHYDRAMINE HCL 25 MG PO CAPS
25.0000 mg | ORAL_CAPSULE | Freq: Four times a day (QID) | ORAL | Status: DC | PRN
Start: 2022-08-05 — End: 2022-08-08
  Administered 2022-08-05: 25 mg via ORAL
  Filled 2022-08-05: qty 1

## 2022-08-05 NOTE — Plan of Care (Signed)
Plan of care reviewed and discussed. °

## 2022-08-05 NOTE — Progress Notes (Signed)
PODIATRY PROGRESS NOTE  NAME [name] MRN [id number] DOB Jul 07, 1956 DOA 07/30/2022   Reason for consult:  Chief Complaint  Patient presents with   Toe Pain    Right   Post-op Problem     History of present illness: 66 y.o. female POD #2  s/p right foot hardware removal, bone biopsy.  States that she was up yesterday had some pain.  Noticed some bleeding on the bandage they had to reinforce the dressing.  Otherwise feeling well this morning.  Vitals:   08/04/22 2100 08/05/22 0447  BP: 114/66 109/60  Pulse: 66 (!) 56  Resp: 16 14  Temp: 98.9 F (37.2 C) 97.7 F (36.5 C)  SpO2: 93% 100%   CBC    Component Value Date/Time   WBC 9.4 08/02/2022 0341   RBC 4.13 08/02/2022 0341   HGB 12.3 08/02/2022 0341   HGB 12.5 07/17/2022 1614   HGB 11.7 05/02/2011 1412   HCT 38.4 08/02/2022 0341   HCT 38.0 07/17/2022 1614   HCT 37.1 05/02/2011 1412   PLT 272 08/02/2022 0341   PLT 431 07/17/2022 1614   MCV 93.0 08/02/2022 0341   MCV 91 07/17/2022 1614   MCV 94.4 05/02/2011 1412   MCH 29.8 08/02/2022 0341   MCHC 32.0 08/02/2022 0341   RDW 14.5 08/02/2022 0341   RDW 13.2 07/17/2022 1614   RDW 18.3 (H) 05/02/2011 1412   LYMPHSABS 2.5 08/02/2022 0341   LYMPHSABS 3.2 (H) 07/17/2022 1614   LYMPHSABS 2.4 05/02/2011 1412   MONOABS 0.9 08/02/2022 0341   MONOABS 0.8 05/02/2011 1412   EOSABS 0.4 08/02/2022 0341   EOSABS 0.3 07/17/2022 1614   BASOSABS 0.1 08/02/2022 0341   BASOSABS 0.1 07/17/2022 1614   BASOSABS 0.1 05/02/2011 1412        Latest Ref Rng & Units 08/05/2022    3:26 AM 08/04/2022    3:13 AM 08/03/2022    4:36 AM  BMP  Glucose 70 - 99 mg/dL 409  85  94   BUN 8 - 23 mg/dL 17  16  15    Creatinine 0.44 - 1.00 mg/dL 8.11  9.14  7.82   Sodium 135 - 145 mmol/L 143  143  141   Potassium 3.5 - 5.1 mmol/L 4.0  4.0  3.6   Chloride 98 - 111 mmol/L 109  108  110   CO2 22 - 32 mmol/L 28  28  26    Calcium 8.9 - 10.3 mg/dL 8.8  8.8  8.4         Physical Exam: General: AAO  x 3, NAD-laying in bed eating breakfast  Dermatology: Dressing intact with there was some bloody strikethrough on the bandage.  See pictures below incision well coapted.  There is still chronic appearing edema to the toe, unchanged.  No drainage approximately fluctuance or crepitation.  No active bleeding noted.     Vascular: DP, PT pulses palpable  Neurological: Sensation decreased  Musculoskeletal Exam: No significant pain on exam    ASSESSMENT/PLAN OF CARE  POD # 1 s/p right hallux removal of hardware, bone biopsy  -Pathology: pending  -Bone culture proximal Phalanx- No growth- prelim  -Bone culture distal phalanx- no growth- prelim  -Hardware culture: no growth- prelim -Joint fluid culture- rare GPC- prelim  -Appreciate ID recommendations -We again discussed treatment options but will await the results of the pathology before proceeding with further treatments.  She states that if there is bone infection she would likely want to proceed  with amputation.  Hopefully we get the pathology reports back tomorrow.  Will discuss with Dr. Lilian Kapur. -Podiatry will continue to follow    Please contact me directly with any questions or concerns.     Ovid Curd, DPM [facility]  Dr. Lesia Sago. Alexes Menchaca, DPM    [address] [place], [place] [address]                Office [phone]  Fax [phone]

## 2022-08-05 NOTE — Progress Notes (Signed)
Subjective: No new complaints   Antibiotics:  Anti-infectives (From admission, onward)    Start     Dose/Rate Route Frequency Ordered Stop   08/05/22 1000  DAPTOmycin (CUBICIN) 700 mg in sodium chloride 0.9 % IVPB        8 mg/kg  85.3 kg 128 mL/hr over 30 Minutes Intravenous Daily 08/04/22 1523     08/04/22 1415  fluconazole (DIFLUCAN) tablet 150 mg        150 mg Oral  Once 08/04/22 1326 08/04/22 1506   08/01/22 1200  vancomycin (VANCOCIN) IVPB 1000 mg/200 mL premix  Status:  Discontinued        1,000 mg 200 mL/hr over 60 Minutes Intravenous Every 24 hours 07/31/22 1124 08/04/22 1543   07/31/22 1630  vancomycin (VANCOCIN) IVPB 1000 mg/200 mL premix  Status:  Discontinued        1,000 mg 200 mL/hr over 60 Minutes Intravenous Every 24 hours 07/30/22 1602 07/30/22 1604   07/31/22 1215  vancomycin (VANCOREADY) IVPB 1750 mg/350 mL        1,750 mg 175 mL/hr over 120 Minutes Intravenous  Once 07/31/22 1117 07/31/22 1457   07/30/22 1615  vancomycin (VANCOCIN) IVPB 1000 mg/200 mL premix  Status:  Discontinued       See Hyperspace for full Linked Orders Report.   1,000 mg 200 mL/hr over 60 Minutes Intravenous  Once 07/30/22 1602 07/30/22 1604   07/30/22 1615  vancomycin (VANCOCIN) IVPB 1000 mg/200 mL premix  Status:  Discontinued       See Hyperspace for full Linked Orders Report.   1,000 mg 200 mL/hr over 60 Minutes Intravenous  Once 07/30/22 1602 07/30/22 1604   07/30/22 1604  vancomycin (VANCOCIN) IVPB 1000 mg/200 mL premix  Status:  Discontinued        1,000 mg 200 mL/hr over 60 Minutes Intravenous Every 24 hours 07/30/22 1604 07/31/22 1117   07/30/22 1600  ceFEPIme (MAXIPIME) 2 g in sodium chloride 0.9 % 100 mL IVPB  Status:  Discontinued        2 g 200 mL/hr over 30 Minutes Intravenous Every 8 hours 07/30/22 1555 08/04/22 1543   07/30/22 1600  vancomycin (VANCOREADY) IVPB 1500 mg/300 mL  Status:  Discontinued        1,500 mg 150 mL/hr over 120 Minutes Intravenous   Once 07/30/22 1555 07/30/22 1602       Medications: Scheduled Meds:  allopurinol  300 mg Oral Daily   ARIPiprazole  5 mg Oral Daily   atorvastatin  40 mg Oral QHS   cycloSPORINE  1 drop Both Eyes BID   DULoxetine  60 mg Oral BID   enoxaparin (LOVENOX) injection  40 mg Subcutaneous Q24H   gabapentin  300 mg Oral TID   loratadine  10 mg Oral Daily   melatonin  5 mg Oral QHS   metoprolol tartrate  50 mg Oral BID   multivitamin  1 tablet Oral Daily   nortriptyline  100 mg Oral q1800   OXcarbazepine  150 mg Oral BID   pantoprazole  40 mg Oral QHS   Continuous Infusions:  DAPTOmycin (CUBICIN) 700 mg in sodium chloride 0.9 % IVPB 700 mg (08/05/22 1015)   PRN Meds:.acetaminophen **OR** acetaminophen, ALPRAZolam, methocarbamol, morphine injection, ondansetron **OR** ondansetron (ZOFRAN) IV, mouth rinse, oxyCODONE-acetaminophen, polyvinyl alcohol, prochlorperazine    Objective: Weight change:   Intake/Output Summary (Last 24 hours) at 08/05/2022 1114 Last data filed at 08/04/2022 1707 Gross per 24  hour  Intake 100 ml  Output --  Net 100 ml   Blood pressure 109/60, pulse (!) 56, temperature 97.7 F (36.5 C), temperature source Oral, resp. rate 14, height 5' (1.524 m), weight 85.3 kg, last menstrual period 07/27/2006, SpO2 100 %. Temp:  [97.7 F (36.5 C)-98.9 F (37.2 C)] 97.7 F (36.5 C) (09/10 0447) Pulse Rate:  [56-73] 56 (09/10 0447) Resp:  [14-18] 14 (09/10 0447) BP: (109-119)/(58-66) 109/60 (09/10 0447) SpO2:  [93 %-100 %] 100 % (09/10 0447)  Physical Exam: Physical Exam Constitutional:      General: She is not in acute distress.    Appearance: She is well-developed. She is not diaphoretic.  HENT:     Head: Normocephalic and atraumatic.     Right Ear: External ear normal.     Left Ear: External ear normal.     Mouth/Throat:     Pharynx: No oropharyngeal exudate.  Eyes:     General: No scleral icterus.    Conjunctiva/sclera: Conjunctivae normal.     Pupils:  Pupils are equal, round, and reactive to light.  Cardiovascular:     Rate and Rhythm: Normal rate and regular rhythm.  Pulmonary:     Effort: Pulmonary effort is normal. No respiratory distress.     Breath sounds: No wheezing.  Abdominal:     General: There is no distension.  Musculoskeletal:        General: No tenderness. Normal range of motion.  Lymphadenopathy:     Cervical: No cervical adenopathy.  Skin:    General: Skin is warm and dry.     Coloration: Skin is not pale.     Findings: No erythema or rash.  Neurological:     General: No focal deficit present.     Mental Status: She is alert and oriented to person, place, and time.     Motor: No abnormal muscle tone.     Coordination: Coordination normal.  Psychiatric:        Mood and Affect: Mood normal.        Behavior: Behavior normal.        Thought Content: Thought content normal.        Judgment: Judgment normal.    Toe dressed  CBC:    BMET Recent Labs    08/04/22 0313 08/05/22 0326  NA 143 143  K 4.0 4.0  CL 108 109  CO2 28 28  GLUCOSE 85 101*  BUN 16 17  CREATININE 0.68 0.75  CALCIUM 8.8* 8.8*     Liver Panel  No results for input(s): "PROT", "ALBUMIN", "AST", "ALT", "ALKPHOS", "BILITOT", "BILIDIR", "IBILI" in the last 72 hours.     Sedimentation Rate No results for input(s): "ESRSEDRATE" in the last 72 hours. C-Reactive Protein No results for input(s): "CRP" in the last 72 hours.  Micro Results: Recent Results (from the past 720 hour(s))  WOUND CULTURE     Status: None   Collection Time: 07/19/22 11:35 AM   Specimen: Abscess; Wound  Result Value Ref Range Status   MICRO NUMBER: [id number]  Final   SPECIMEN QUALITY: Adequate  Final   SOURCE: NOT GIVEN  Final   STATUS: FINAL  Final   GRAM STAIN:   Final    No white blood cells seen No epithelial cells seen No organisms seen   RESULT: No Growth  Final   COMMENT:   Final    No source was provided. The specimen was tested and reported  based upon the test code  ordered. If this is incorrect, please contact client services.  Culture, blood (Routine X 2) w Reflex to ID Panel     Status: None (Preliminary result)   Collection Time: 08/01/22  9:44 AM   Specimen: BLOOD LEFT ARM  Result Value Ref Range Status   Specimen Description   Final    BLOOD LEFT ARM Performed at [facility] [address], [place], [place] [address]    Special Requests   Final    BOTTLES DRAWN AEROBIC ONLY Blood Culture adequate volume Performed at [facility] [address] [place], [place] [address]    Culture   Final    NO GROWTH 4 DAYS Performed at [facility], [place] [address] [place], [place] [address]    Report Status PENDING  Incomplete  Culture, blood (Routine X 2) w Reflex to ID Panel     Status: None (Preliminary result)   Collection Time: 08/01/22 10:28 AM   Specimen: BLOOD  Result Value Ref Range Status   Specimen Description   Final    BLOOD BLOOD LEFT HAND Performed at [facility] [address], [place], [place] [address]    Special Requests   Final    Blood Culture adequate volume AEROBIC BOTTLE ONLY Performed at [facility] [address], [place], [place] [address]    Culture   Final    NO GROWTH 4 DAYS Performed at [facility], [place] [address], [place], [place] [address]    Report Status PENDING  Incomplete  Surgical pcr screen     Status: None   Collection Time: 08/03/22  2:09 PM   Specimen: Nasal Mucosa; Nasal Swab  Result Value Ref Range Status   MRSA, PCR NEGATIVE NEGATIVE Final   Staphylococcus aureus NEGATIVE NEGATIVE Final    Comment: (NOTE) The Xpert SA Assay (FDA approved for NASAL specimens in patients 22 years of age and older), is one component of a comprehensive surveillance program. It is not intended to diagnose infection nor to guide or monitor treatment. Performed at [facility], [place] [address] [place], [place] [address]   Aerobic/Anaerobic Culture w Gram Stain (surgical/deep wound)     Status: None (Preliminary result)   Collection Time: 08/03/22  5:59 PM   Specimen: Wound; Body Fluid  Result Value Ref Range Status   Specimen Description   Final    WOUND Performed at [facility] [address], [place], [place] [address]    Special Requests   Final    RIGHT GREAT BIG TOE JOINT FLUID Performed at [facility] [address], [place], [place] [address]    Gram Stain NO WBC SEEN RARE GRAM POSITIVE COCCI   Final   Culture   Final    CULTURE REINCUBATED FOR BETTER GROWTH Performed at [facility], [place] [address], [place], [place] [address]    Report Status PENDING  Incomplete  Aerobic/Anaerobic Culture w Gram Stain (surgical/deep wound)     Status: None (Preliminary result)   Collection Time: 08/03/22  6:07 PM   Specimen: Wound; Body Fluid  Result Value Ref Range Status   Specimen Description   Final    WOUND Performed at [facility] [address] [place], [place] [address]    Special Requests   Final    HARDWARE RIGHT GREAT BIG TOE Performed at [facility] [address] [place],  [place] [address]    Gram Stain NO ORGANISMS SEEN NO WBC SEEN   Final   Culture   Final    CULTURE REINCUBATED FOR BETTER GROWTH Performed at [facility], [place] [address], [place], [place] [address]    Report Status PENDING  Incomplete  Aerobic/Anaerobic Culture w Gram Stain (surgical/deep wound)     Status: None (Preliminary result)   Collection Time: 08/03/22  6:15 PM   Specimen: Wound  Result Value Ref Range Status   Specimen Description   Final    WOUND Performed at [facility] [address], [place], [place] [address]    Special Requests   Final    NONE DISTAL PHALANX BONE Performed at [facility] [address],  [place], [place] [address]    Gram Stain NO ORGANISMS SEEN NO WBC SEEN   Final   Culture   Final    NO GROWTH 2 DAYS Performed at [facility], [place] [address], [place], [place] [address]    Report Status PENDING  Incomplete  Aerobic/Anaerobic Culture w Gram Stain (surgical/deep wound)     Status: None (Preliminary result)   Collection Time: 08/03/22  6:20 PM   Specimen: Wound  Result Value Ref Range Status   Specimen Description   Final    WOUND Performed at [facility] [address] [place], [place] [address]    Special Requests   Final    NONE PROXIMAL PHALANX BONE Performed at [facility], [place] [address] [place], [place] [address]    Gram Stain NO ORGANISMS SEEN NO WBC SEEN   Final   Culture   Final    CULTURE REINCUBATED FOR BETTER GROWTH Performed at [facility], [place] [address], [place], [place] [address]    Report Status PENDING  Incomplete    Studies/Results: No results found.    Assessment/Plan:  INTERVAL HISTORY: Cultures have still not yielded an organism   Principal Problem:   Osteomyelitis of great toe of right foot (HCC) Active Problems:   Mixed hyperlipidemia   OSA (obstructive sleep apnea)   Gout   Peripheral polyneuropathy   Bipolar disorder (HCC)   Right foot pain   Infected orthopedic implant ([place])   Charcot's arthropathy of forefoot    [name] is a 66 y.o. female with associated osteomyelitis of great toe status post removal of hardware and bone biopsy.  She is contemplating whether to undergo amputation of her great toe but wants to follow-up on the pathology reports on Monday.  She does have a history of gastric bypass surgery.  She is currently on daptomycin with cultures cooking.  #1 hardware associated osteomyelitis status post removal of hardware and bone biopsy:  I would favor amputation for cure, but I would want to follow-up with "mop up antibiotics.  For now  continue daptomycin follow-up intraoperative cultures.  #2 history of differential colitis: Want to be cautious with types of antibiotics were used with her.  I spent 38 minutes with the patient including than 50% of the time in face to face counseling of the patient regarding osteomyelitis along with review of medical records in preparation for the visit and during the visit and in coordination of her care.   My partner Dr. [place] Bali will take over the service tomorrow   LOS: 5 days   [name] 08/05/2022, 11:14 AM

## 2022-08-05 NOTE — Progress Notes (Signed)
PROGRESS NOTE  LYLIANNA FRAISER  LGX:[id number] DOB: 01-Aug-1956 DOA: 07/30/2022 PCP: Rita Ohara, MD   Brief Narrative:  Patient is a 66 year old female with history of depression, anxiety, chronic fatigue syndrome, degenerative disc disease, fibromyalgia, gastroparesis, GERD, hyperlipidemia, lumbar radiculopathy, neuropathy, OSA who presented with complaints of right great toe pain, swelling.  Patient recently had surgery on the toe last month but for the last couple weeks, she developed increased pain and swelling and followed-up with podiatry and infectious disease and was on antibiotics.  Since the wound was not healing, she was directed  to the emergency department by podiatrist.  X-ray showed increased lucency/erosions  around the great toe interphalangeal joint including increased lucency adjacent to the surgical screws.  Concern for osteomyelitis/osteoarthritis.  Started on broad-spectrum antibiotics.  Bone scan confirmed osteomyelitis.  Underwent foot bone biopsy, hardware removal on 9/8.  ID also consulted .  Assessment & Plan:  Principal Problem:   Osteomyelitis of great toe of right foot (HCC) Active Problems:   Right foot pain   Mixed hyperlipidemia   OSA (obstructive sleep apnea)   Gout   Peripheral polyneuropathy   Bipolar disorder ([place])   Infected orthopedic implant (HCC)   Charcot's arthropathy of forefoot  Right great toe pain: History as above.  No significant erythema or purulence noted on admission.  There was edema, tenderness on the local site.  Concern for Charcot arthropathy.  Images findings as above.  Started on broad spectrum antibiotics.  Blood cultures,NGTD. Bone scan showed hyperemia to the RIGHT foot and focal accumulation of tagged white blood cells in the proximal phalanx of the first RIGHT digit suggesting infectious/inflammatory process including osteomyelitis. Underwent foot bone biopsy, hardware removal on 9/8.  ID also consulted .  One of the wound culture  has shown rare gram-positive cocci.  On daptomycin.  If bone biopsy is positive, might need amputation of the toe  Idiopathic chronic gout: On allopurinol  Bipolar disorder/anxiety/depression: On Abilify, Cymbalta, nortriptyline, Trileptal, Xanax  Hyperlipidemia: On Lipitor  OSA: CPAP at night  Neuropathy: On Gabapentin  Nausea: Continue antiemetics  Obesity: BMI of 36.7       DVT prophylaxis:enoxaparin (LOVENOX) injection 40 mg Start: 07/30/22 2000     Code Status: Full Code  Family Communication: Husband at bedside on 9/6  Patient status:Inpatient  Patient is from :Home  Anticipated discharge CX:KGYJ  Estimated DC date: awaiting further management by podiatry/ID  Consultants: Podiatry  Procedures:None  Antimicrobials:  Anti-infectives (From admission, onward)    Start     Dose/Rate Route Frequency Ordered Stop   08/05/22 1000  DAPTOmycin (CUBICIN) 700 mg in sodium chloride 0.9 % IVPB        8 mg/kg  85.3 kg 128 mL/hr over 30 Minutes Intravenous Daily 08/04/22 1523     08/04/22 1415  fluconazole (DIFLUCAN) tablet 150 mg        150 mg Oral  Once 08/04/22 1326 08/04/22 1506   08/01/22 1200  vancomycin (VANCOCIN) IVPB 1000 mg/200 mL premix  Status:  Discontinued        1,000 mg 200 mL/hr over 60 Minutes Intravenous Every 24 hours 07/31/22 1124 08/04/22 1543   07/31/22 1630  vancomycin (VANCOCIN) IVPB 1000 mg/200 mL premix  Status:  Discontinued        1,000 mg 200 mL/hr over 60 Minutes Intravenous Every 24 hours 07/30/22 1602 07/30/22 1604   07/31/22 1215  vancomycin (VANCOREADY) IVPB 1750 mg/350 mL        1,750 mg  175 mL/hr over 120 Minutes Intravenous  Once 07/31/22 1117 07/31/22 1457   07/30/22 1615  vancomycin (VANCOCIN) IVPB 1000 mg/200 mL premix  Status:  Discontinued       See Hyperspace for full Linked Orders Report.   1,000 mg 200 mL/hr over 60 Minutes Intravenous  Once 07/30/22 1602 07/30/22 1604   07/30/22 1615  vancomycin (VANCOCIN) IVPB 1000  mg/200 mL premix  Status:  Discontinued       See Hyperspace for full Linked Orders Report.   1,000 mg 200 mL/hr over 60 Minutes Intravenous  Once 07/30/22 1602 07/30/22 1604   07/30/22 1604  vancomycin (VANCOCIN) IVPB 1000 mg/200 mL premix  Status:  Discontinued        1,000 mg 200 mL/hr over 60 Minutes Intravenous Every 24 hours 07/30/22 1604 07/31/22 1117   07/30/22 1600  ceFEPIme (MAXIPIME) 2 g in sodium chloride 0.9 % 100 mL IVPB  Status:  Discontinued        2 g 200 mL/hr over 30 Minutes Intravenous Every 8 hours 07/30/22 1555 08/04/22 1543   07/30/22 1600  vancomycin (VANCOREADY) IVPB 1500 mg/300 mL  Status:  Discontinued        1,500 mg 150 mL/hr over 120 Minutes Intravenous  Once 07/30/22 1555 07/30/22 1602       Subjective:  Patient seen and examined the bedside today.  Hemodynamically stable.  Comfortable without any complaints  Objective: Vitals:   08/04/22 1200 08/04/22 1603 08/04/22 2100 08/05/22 0447  BP: (!) 117/58 (!) 119/58 114/66 109/60  Pulse: 73 70 66 (!) 56  Resp: '15 18 16 14  '$ Temp: 98.2 F (36.8 C) 98.3 F (36.8 C) 98.9 F (37.2 C) 97.7 F (36.5 C)  TempSrc:  Oral Oral Oral  SpO2: 95% 95% 93% 100%  Weight:      Height:        Intake/Output Summary (Last 24 hours) at 08/05/2022 1014 Last data filed at 08/04/2022 1707 Gross per 24 hour  Intake 300 ml  Output --  Net 300 ml   Filed Weights   07/30/22 1219  Weight: 85.3 kg    Examination:  General exam: Overall comfortable, not in distress,obese HEENT: PERRL Respiratory system:  no wheezes or crackles  Cardiovascular system: S1 & S2 heard, RRR.  Gastrointestinal system: Abdomen is nondistended, soft and nontender. Central nervous system: Alert and oriented Extremities: No edema, no clubbing ,no cyanosis,right foot covered with dressing Skin: No rashes, no ulcers,no icterus      Data Reviewed: I have personally reviewed following labs and imaging studies  CBC: Recent Labs  Lab  07/30/22 1232 07/31/22 0317 08/01/22 0329 08/02/22 0341  WBC 8.6 9.0 7.5 9.4  NEUTROABS 5.1  --  4.0 5.5  HGB 12.7 12.6 11.8* 12.3  HCT 38.4 38.8 37.2 38.4  MCV 91.4 93.5 94.2 93.0  PLT 301 278 278 734   Basic Metabolic Panel: Recent Labs  Lab 08/01/22 0329 08/02/22 0341 08/03/22 0436 08/04/22 0313 08/05/22 0326  NA 141 142 141 143 143  K 3.8 3.4* 3.6 4.0 4.0  CL 106 108 110 108 109  CO2 '29 27 26 28 28  '$ GLUCOSE 104* 101* 94 85 101*  BUN '15 17 15 16 17  '$ CREATININE 0.82 0.80 0.69 0.68 0.75  CALCIUM 8.8* 9.0 8.4* 8.8* 8.8*  MG 2.0 2.1  --   --   --      Recent Results (from the past 240 hour(s))  Culture, blood (Routine X 2) w Reflex  to ID Panel     Status: None (Preliminary result)   Collection Time: 08/01/22  9:44 AM   Specimen: BLOOD LEFT ARM  Result Value Ref Range Status   Specimen Description   Final    BLOOD LEFT ARM Performed at [facility] [address], [place], [place] [address]    Special Requests   Final    BOTTLES DRAWN AEROBIC ONLY Blood Culture adequate volume Performed at [facility] [address], [place], [place] [address]    Culture   Final    NO GROWTH 4 DAYS Performed at [facility], [place] [address] [place], [place] [address]    Report Status PENDING  Incomplete  Culture, blood (Routine X 2) w Reflex to ID Panel     Status: None (Preliminary result)   Collection Time: 08/01/22 10:28 AM   Specimen: BLOOD  Result Value Ref Range Status   Specimen Description   Final    BLOOD BLOOD LEFT HAND Performed at [facility] [address], [place], [place] [address]    Special Requests   Final    Blood Culture adequate volume AEROBIC BOTTLE ONLY Performed at [facility] [address], [place], [place] [address]    Culture   Final    NO GROWTH 4 DAYS Performed at [facility], [place] [address], [place], [place] [address]    Report Status PENDING   Incomplete  Surgical pcr screen     Status: None   Collection Time: 08/03/22  2:09 PM   Specimen: Nasal Mucosa; Nasal Swab  Result Value Ref Range Status   MRSA, PCR NEGATIVE NEGATIVE Final   Staphylococcus aureus NEGATIVE NEGATIVE Final    Comment: (NOTE) The Xpert SA Assay (FDA approved for NASAL specimens in patients 60 years of age and older), is one component of a comprehensive surveillance program. It is not intended to diagnose infection nor to guide or monitor treatment. Performed at [facility], [place] [address] [place], [place] [address]   Aerobic/Anaerobic Culture w Gram Stain (surgical/deep wound)     Status: None (Preliminary result)   Collection Time: 08/03/22  5:59 PM   Specimen: Wound; Body Fluid  Result Value Ref Range Status   Specimen Description   Final    WOUND Performed at [facility] [address] [place], [place] [address]    Special Requests   Final    RIGHT GREAT BIG TOE JOINT FLUID Performed at [facility] [address] [place], [place] [address]    Gram Stain NO WBC SEEN RARE GRAM POSITIVE COCCI   Final   Culture   Final    CULTURE REINCUBATED FOR BETTER GROWTH Performed at [facility], [place] [address], [place], [place] [address]    Report Status PENDING  Incomplete  Aerobic/Anaerobic Culture w Gram Stain (surgical/deep wound)     Status: None (Preliminary result)   Collection Time: 08/03/22  6:07 PM   Specimen: Wound; Body Fluid  Result Value Ref Range Status   Specimen Description   Final    WOUND Performed at [facility] [address], [place], [place] [address]    Special Requests   Final    HARDWARE RIGHT GREAT BIG TOE Performed at [facility] [address], [place], [place] [address]    Gram Stain NO ORGANISMS SEEN NO WBC SEEN   Final  Culture   Final    NO GROWTH < 24 HOURS Performed at [facility], [place] [address], [place], [place] [address]    Report Status PENDING  Incomplete  Aerobic/Anaerobic Culture w Gram Stain (surgical/deep wound)     Status: None (Preliminary result)   Collection Time: 08/03/22  6:15 PM   Specimen: Wound  Result Value Ref Range Status   Specimen Description   Final    WOUND Performed at [facility] [address] [place], [place] [address]    Special Requests   Final    NONE DISTAL PHALANX BONE Performed at [facility] [address] [place], [place] [address]    Gram Stain NO ORGANISMS SEEN NO WBC SEEN   Final   Culture   Final    NO GROWTH < 24 HOURS Performed at [facility], [place] [address], [place], [place] [address]    Report Status PENDING  Incomplete  Aerobic/Anaerobic Culture w Gram Stain (surgical/deep wound)     Status: None (Preliminary result)   Collection Time: 08/03/22  6:20 PM   Specimen: Wound  Result Value Ref Range Status   Specimen Description   Final    WOUND Performed at [facility] [address] [place], [place] [address]    Special Requests   Final    NONE PROXIMAL PHALANX BONE Performed at [facility], [place] [address] [place], [place] [address]    Gram Stain NO ORGANISMS SEEN NO WBC SEEN   Final   Culture   Final    CULTURE REINCUBATED FOR BETTER GROWTH Performed at [facility], [place] [address] [place], [place] [address]    Report Status PENDING  Incomplete     Radiology Studies: No results found.  Scheduled Meds:  allopurinol  300 mg Oral Daily   ARIPiprazole  5 mg Oral Daily   atorvastatin  40 mg Oral QHS   cycloSPORINE  1 drop Both Eyes BID   DULoxetine  60 mg Oral BID   enoxaparin (LOVENOX) injection  40 mg Subcutaneous Q24H   gabapentin  300 mg Oral TID   loratadine  10 mg Oral Daily   melatonin  5 mg Oral QHS   metoprolol tartrate  50 mg Oral BID   multivitamin  1 tablet Oral Daily   nortriptyline  100 mg  Oral q1800   OXcarbazepine  150 mg Oral BID   pantoprazole  40 mg Oral QHS   Continuous Infusions:  DAPTOmycin (CUBICIN) 700 mg in sodium chloride 0.9 % IVPB       LOS: 5 days   Shelly Coss, MD [facility] P9/08/2022, 10:14 AM

## 2022-08-06 ENCOUNTER — Other Ambulatory Visit: Payer: Self-pay | Admitting: Physician Assistant

## 2022-08-06 DIAGNOSIS — M8000XA Age-related osteoporosis with current pathological fracture, unspecified site, initial encounter for fracture: Secondary | ICD-10-CM

## 2022-08-06 DIAGNOSIS — M869 Osteomyelitis, unspecified: Secondary | ICD-10-CM | POA: Diagnosis not present

## 2022-08-06 LAB — CULTURE, BLOOD (ROUTINE X 2)
Culture: NO GROWTH
Culture: NO GROWTH
Special Requests: ADEQUATE
Special Requests: ADEQUATE

## 2022-08-06 MED ORDER — FORTEO 600 MCG/2.4ML ~~LOC~~ SOPN: 20.0000 ug | PEN_INJECTOR | Freq: Every day | SUBCUTANEOUS | 0 refills | Status: DC

## 2022-08-06 MED ORDER — SODIUM CHLORIDE 0.9 % IV SOLN
8.0000 mg/kg | Freq: Every day | INTRAVENOUS | Status: DC
  Administered 2022-08-06 – 2022-08-08 (×3): 500 mg via INTRAVENOUS
  Filled 2022-08-06 (×3): qty 10

## 2022-08-06 NOTE — Telephone Encounter (Signed)
I sent her a MyChart message in response to hers, Dr Amalia Hailey will be seeing her later today and we discussed her case

## 2022-08-06 NOTE — Progress Notes (Signed)
PROGRESS NOTE  [name]  BDZ:[id number] DOB: 04/23/1956 DOA: 07/30/2022 PCP: Rita Ohara, MD   Brief Narrative:  Patient is a 66 year old female with history of depression, anxiety, chronic fatigue syndrome, degenerative disc disease, fibromyalgia, gastroparesis, GERD, hyperlipidemia, lumbar radiculopathy, neuropathy, OSA who presented with complaints of right great toe pain, swelling.  Patient recently had surgery on the toe last month but for the last couple weeks, she developed increased pain and swelling and followed-up with podiatry and infectious disease and was on antibiotics.  Since the wound was not healing, she was directed  to the emergency department by podiatrist.  X-ray showed increased lucency/erosions  around the great toe interphalangeal joint including increased lucency adjacent to the surgical screws.  Concern for osteomyelitis/osteoarthritis.  Started on broad-spectrum antibiotics.  Bone scan confirmed osteomyelitis.  Underwent foot bone biopsy, hardware removal on 9/8.  ID also consulted ,pending bone biopsy.  Assessment & Plan:  Principal Problem:   Osteomyelitis of great toe of right foot (HCC) Active Problems:   Right foot pain   Mixed hyperlipidemia   OSA (obstructive sleep apnea)   Gout   Peripheral polyneuropathy   Bipolar disorder ([place])   Infected orthopedic implant (HCC)   Charcot's arthropathy of forefoot   History of Clostridioides difficile colitis  Right great toe pain: History as above.  No significant erythema or purulence noted on admission.  There was edema, tenderness on the local site.  Concern for Charcot arthropathy.  Images findings as above.  Started on broad spectrum antibiotics.  Blood cultures,NGTD. Bone scan showed hyperemia to the RIGHT foot and focal accumulation of tagged white blood cells in the proximal phalanx of the first RIGHT digit suggesting infectious/inflammatory process including osteomyelitis. Underwent foot bone biopsy,  hardware removal on 9/8.  ID also consulted .  Wound culture has shown rare Staph epidermidis resistant to penicillin.  On daptomycin.  If bone biopsy is positive, might need amputation of the toe  Idiopathic chronic gout: On allopurinol  Bipolar disorder/anxiety/depression: On Abilify, Cymbalta, nortriptyline, Trileptal, Xanax  Hyperlipidemia: On Lipitor  OSA: CPAP at night  Neuropathy: On Gabapentin  Nausea: Continue antiemetics  Obesity: BMI of 36.7       DVT prophylaxis:enoxaparin (LOVENOX) injection 40 mg Start: 07/30/22 2000     Code Status: Full Code  Family Communication: Husband at bedside on 9/6  Patient status:Inpatient  Patient is from :Home  Anticipated discharge TM:HDQQ  Estimated DC date: awaiting further management by podiatry/ID  Consultants: Podiatry  Procedures:None  Antimicrobials:  Anti-infectives (From admission, onward)    Start     Dose/Rate Route Frequency Ordered Stop   08/06/22 2000  DAPTOmycin (CUBICIN) 500 mg in sodium chloride 0.9 % IVPB        8 mg/kg  61.4 kg (Adjusted) 120 mL/hr over 30 Minutes Intravenous Daily 08/06/22 0850     08/05/22 1000  DAPTOmycin (CUBICIN) 700 mg in sodium chloride 0.9 % IVPB  Status:  Discontinued        8 mg/kg  85.3 kg 128 mL/hr over 30 Minutes Intravenous Daily 08/04/22 1523 08/06/22 0850   08/04/22 1415  fluconazole (DIFLUCAN) tablet 150 mg        150 mg Oral  Once 08/04/22 1326 08/04/22 1506   08/01/22 1200  vancomycin (VANCOCIN) IVPB 1000 mg/200 mL premix  Status:  Discontinued        1,000 mg 200 mL/hr over 60 Minutes Intravenous Every 24 hours 07/31/22 1124 08/04/22 1543   07/31/22 1630  vancomycin (  VANCOCIN) IVPB 1000 mg/200 mL premix  Status:  Discontinued        1,000 mg 200 mL/hr over 60 Minutes Intravenous Every 24 hours 07/30/22 1602 07/30/22 1604   07/31/22 1215  vancomycin (VANCOREADY) IVPB 1750 mg/350 mL        1,750 mg 175 mL/hr over 120 Minutes Intravenous  Once 07/31/22 1117  07/31/22 1457   07/30/22 1615  vancomycin (VANCOCIN) IVPB 1000 mg/200 mL premix  Status:  Discontinued       See Hyperspace for full Linked Orders Report.   1,000 mg 200 mL/hr over 60 Minutes Intravenous  Once 07/30/22 1602 07/30/22 1604   07/30/22 1615  vancomycin (VANCOCIN) IVPB 1000 mg/200 mL premix  Status:  Discontinued       See Hyperspace for full Linked Orders Report.   1,000 mg 200 mL/hr over 60 Minutes Intravenous  Once 07/30/22 1602 07/30/22 1604   07/30/22 1604  vancomycin (VANCOCIN) IVPB 1000 mg/200 mL premix  Status:  Discontinued        1,000 mg 200 mL/hr over 60 Minutes Intravenous Every 24 hours 07/30/22 1604 07/31/22 1117   07/30/22 1600  ceFEPIme (MAXIPIME) 2 g in sodium chloride 0.9 % 100 mL IVPB  Status:  Discontinued        2 g 200 mL/hr over 30 Minutes Intravenous Every 8 hours 07/30/22 1555 08/04/22 1543   07/30/22 1600  vancomycin (VANCOREADY) IVPB 1500 mg/300 mL  Status:  Discontinued        1,500 mg 150 mL/hr over 120 Minutes Intravenous  Once 07/30/22 1555 07/30/22 1602       Subjective:  Patient seen and examined the bedside today.  Hemodynamically stable without any new complaints today.  Objective: Vitals:   08/04/22 2100 08/05/22 0447 08/05/22 1356 08/05/22 2049  BP: 114/66 109/60 114/62 134/76  Pulse: 66 (!) 56 (!) 58 63  Resp: '16 14 18 17  '$ Temp: 98.9 F (37.2 C) 97.7 F (36.5 C) 98.3 F (36.8 C) 98.1 F (36.7 C)  TempSrc: Oral Oral Oral Oral  SpO2: 93% 100% 98% 96%  Weight:      Height:        Intake/Output Summary (Last 24 hours) at 08/06/2022 1034 Last data filed at 08/05/2022 1542 Gross per 24 hour  Intake 64 ml  Output --  Net 64 ml   Filed Weights   07/30/22 1219  Weight: 85.3 kg    Examination:  General exam: Overall comfortable, not in distress,obese HEENT: PERRL Respiratory system:  no wheezes or crackles  Cardiovascular system: S1 & S2 heard, RRR.  Gastrointestinal system: Abdomen is nondistended, soft and  nontender. Central nervous system: Alert and oriented Extremities: No edema, no clubbing ,no cyanosis,right foot wound Skin: No rashes, no ulcers,no icterus      Data Reviewed: I have personally reviewed following labs and imaging studies  CBC: Recent Labs  Lab 07/30/22 1232 07/31/22 0317 08/01/22 0329 08/02/22 0341  WBC 8.6 9.0 7.5 9.4  NEUTROABS 5.1  --  4.0 5.5  HGB 12.7 12.6 11.8* 12.3  HCT 38.4 38.8 37.2 38.4  MCV 91.4 93.5 94.2 93.0  PLT 301 278 278 144   Basic Metabolic Panel: Recent Labs  Lab 08/01/22 0329 08/02/22 0341 08/03/22 0436 08/04/22 0313 08/05/22 0326  NA 141 142 141 143 143  K 3.8 3.4* 3.6 4.0 4.0  CL 106 108 110 108 109  CO2 '29 27 26 28 28  '$ GLUCOSE 104* 101* 94 85 101*  BUN 15 17  $'15 16 17  'k$ CREATININE 0.82 0.80 0.69 0.68 0.75  CALCIUM 8.8* 9.0 8.4* 8.8* 8.8*  MG 2.0 2.1  --   --   --      Recent Results (from the past 240 hour(s))  Culture, blood (Routine X 2) w Reflex to ID Panel     Status: None   Collection Time: 08/01/22  9:44 AM   Specimen: BLOOD LEFT ARM  Result Value Ref Range Status   Specimen Description   Final    BLOOD LEFT ARM Performed at [facility] [address], [place], [place] [address]    Special Requests   Final    BOTTLES DRAWN AEROBIC ONLY Blood Culture adequate volume Performed at [facility] [address] [place], [place] [address]    Culture   Final    NO GROWTH 5 DAYS Performed at [facility], [place] [address] [place], [place] [address]    Report Status 08/06/2022 FINAL  Final  Culture, blood (Routine X 2) w Reflex to ID Panel     Status: None   Collection Time: 08/01/22 10:28 AM   Specimen: BLOOD  Result Value Ref Range Status   Specimen Description   Final    BLOOD BLOOD LEFT HAND Performed at [facility] [address], [place], [place] [address]    Special Requests   Final    Blood Culture adequate volume AEROBIC BOTTLE  ONLY Performed at [facility] [address] [place], [place] [address]    Culture   Final    NO GROWTH 5 DAYS Performed at [facility], [place] [address], [place], [place] [address]    Report Status 08/06/2022 FINAL  Final  Surgical pcr screen     Status: None   Collection Time: 08/03/22  2:09 PM   Specimen: Nasal Mucosa; Nasal Swab  Result Value Ref Range Status   MRSA, PCR NEGATIVE NEGATIVE Final   Staphylococcus aureus NEGATIVE NEGATIVE Final    Comment: (NOTE) The Xpert SA Assay (FDA approved for NASAL specimens in patients 49 years of age and older), is one component of a comprehensive surveillance program. It is not intended to diagnose infection nor to guide or monitor treatment. Performed at [facility], [place] [address] [place], [place] [address]   Aerobic/Anaerobic Culture w Gram Stain (surgical/deep wound)     Status: None (Preliminary result)   Collection Time: 08/03/22  5:59 PM   Specimen: Wound; Body Fluid  Result Value Ref Range Status   Specimen Description   Final    WOUND Performed at [facility] [address] [place], [place] [address]    Special Requests   Final    RIGHT GREAT BIG TOE JOINT FLUID Performed at [facility] [address], [place], [place] [address]    Gram Stain   Final    NO WBC SEEN RARE GRAM POSITIVE COCCI Performed at [facility], [place] [address] [place], [place] [address]    Culture   Final    RARE STAPHYLOCOCCUS EPIDERMIDIS NO ANAEROBES ISOLATED; CULTURE IN PROGRESS FOR 5 DAYS    Report Status PENDING  Incomplete   Organism ID, Bacteria STAPHYLOCOCCUS EPIDERMIDIS  Final      Susceptibility   Staphylococcus epidermidis - MIC*    CIPROFLOXACIN 4 RESISTANT Resistant     ERYTHROMYCIN <=0.25 SENSITIVE Sensitive     GENTAMICIN 8 INTERMEDIATE Intermediate  OXACILLIN >=4 RESISTANT Resistant     TETRACYCLINE >=16 RESISTANT Resistant      VANCOMYCIN 2 SENSITIVE Sensitive     TRIMETH/SULFA 80 RESISTANT Resistant     CLINDAMYCIN <=0.25 SENSITIVE Sensitive     RIFAMPIN <=0.5 SENSITIVE Sensitive     Inducible Clindamycin NEGATIVE Sensitive     * RARE STAPHYLOCOCCUS EPIDERMIDIS  Aerobic/Anaerobic Culture w Gram Stain (surgical/deep wound)     Status: None (Preliminary result)   Collection Time: 08/03/22  6:07 PM   Specimen: Wound; Body Fluid  Result Value Ref Range Status   Specimen Description   Final    WOUND Performed at [facility] [address] [place], [place] [address]    Special Requests   Final    HARDWARE RIGHT GREAT BIG TOE Performed at [facility] [address], [place], [place] [address]    Gram Stain   Final    NO ORGANISMS SEEN NO WBC SEEN Performed at [facility], [place] [address], [place], [place] [address]    Culture   Final    RARE STAPHYLOCOCCUS EPIDERMIDIS NO ANAEROBES ISOLATED; CULTURE IN PROGRESS FOR 5 DAYS    Report Status PENDING  Incomplete   Organism ID, Bacteria STAPHYLOCOCCUS EPIDERMIDIS  Final      Susceptibility   Staphylococcus epidermidis - MIC*    CIPROFLOXACIN 4 RESISTANT Resistant     ERYTHROMYCIN <=0.25 SENSITIVE Sensitive     GENTAMICIN 8 INTERMEDIATE Intermediate     OXACILLIN >=4 RESISTANT Resistant     TETRACYCLINE >=16 RESISTANT Resistant     VANCOMYCIN 2 SENSITIVE Sensitive     TRIMETH/SULFA 80 RESISTANT Resistant     CLINDAMYCIN <=0.25 SENSITIVE Sensitive     RIFAMPIN <=0.5 SENSITIVE Sensitive     Inducible Clindamycin NEGATIVE Sensitive     * RARE STAPHYLOCOCCUS EPIDERMIDIS  Aerobic/Anaerobic Culture w Gram Stain (surgical/deep wound)     Status: None (Preliminary result)   Collection Time: 08/03/22  6:15 PM   Specimen: Wound  Result Value Ref Range Status   Specimen Description   Final    WOUND Performed at [facility], [place] [address] [place], [place] [address]    Special Requests   Final     NONE DISTAL PHALANX BONE Performed at [facility] [address] [place], [place] [address]    Gram Stain NO ORGANISMS SEEN NO WBC SEEN   Final   Culture   Final    NO GROWTH 3 DAYS NO ANAEROBES ISOLATED; CULTURE IN PROGRESS FOR 5 DAYS Performed at [facility], [place] [address] [place] [address], [place], [place] [address]    Report Status PENDING  Incomplete  Aerobic/Anaerobic Culture w Gram Stain (surgical/deep wound)     Status: None (Preliminary result)   Collection Time: 08/03/22  6:20 PM   Specimen: Wound  Result Value Ref Range Status   Specimen Description   Final    WOUND Performed at [facility] [address], [place], [place] [address]    Special Requests   Final    NONE PROXIMAL PHALANX BONE Performed at [facility] [place] [address] [place], [place] [address]    Gram Stain   Final    NO ORGANISMS SEEN NO WBC SEEN Performed at [facility], [place] [address], [place], [place] [address]    Culture   Final    RARE STAPHYLOCOCCUS EPIDERMIDIS SUSCEPTIBILITIES PERFORMED ON PREVIOUS CULTURE WITHIN THE LAST 5  DAYS. NO ANAEROBES ISOLATED; CULTURE IN PROGRESS FOR 5 DAYS    Report Status PENDING  Incomplete     Radiology Studies: No results found.  Scheduled Meds:  allopurinol  300 mg Oral Daily   ARIPiprazole  5 mg Oral Daily   atorvastatin  40 mg Oral QHS   cycloSPORINE  1 drop Both Eyes BID   DULoxetine  60 mg Oral BID   enoxaparin (LOVENOX) injection  40 mg Subcutaneous Q24H   gabapentin  300 mg Oral TID   loratadine  10 mg Oral Daily   melatonin  5 mg Oral QHS   metoprolol tartrate  50 mg Oral BID   multivitamin  1 tablet Oral Daily   nortriptyline  100 mg Oral q1800   OXcarbazepine  150 mg Oral BID   pantoprazole  40 mg Oral QHS   Continuous Infusions:  DAPTOmycin (CUBICIN) 500 mg in sodium chloride 0.9 % IVPB       LOS: 6 days   Shelly Coss, MD [facility] P9/09/2022, 10:34 AM

## 2022-08-06 NOTE — Telephone Encounter (Signed)
Next Visit: 12/10/2022   Last Visit: 06/06/2022   Last Fill: 07/27/2021  DX: Age-related osteoporosis with current pathological fracture, initial encounter  Okay to refill pen needles?

## 2022-08-06 NOTE — Progress Notes (Signed)
Pharmacy Antibiotic Note  [name] is a 66 y.o. female admitted on 07/30/2022 with  Osteomyelitis her R great toe associated with hardware placed last month . Infected hardware was removed on 9/9. Patient is currently considering amputation of the infected toe. Pharmacy has been consulted for daptomycin dosing.  Plan: Adjust daptomycin to '500mg'$  daily ('8mg'$ /kg of adjusted body weight) Monitor CK, renal function and s/s of infection F/u culture results and source control with amputation.   Height: 5' (152.4 cm) Weight: 85.3 kg (188 lb) IBW/kg (Calculated) : 45.5  Temp (24hrs), Avg:98.2 F (36.8 C), Min:98.1 F (36.7 C), Max:98.3 F (36.8 C)  Recent Labs  Lab 07/30/22 1232 07/31/22 0317 08/01/22 0329 08/02/22 0341 08/03/22 0436 08/04/22 0313 08/05/22 0326  WBC 8.6 9.0 7.5 9.4  --   --   --   CREATININE 0.86 0.78 0.82 0.80 0.69 0.68 0.75  LATICACIDVEN 1.4  --   --   --   --   --   --     Estimated Creatinine Clearance: 68 mL/min (by C-G formula based on SCr of 0.75 mg/dL).    Allergies  Allergen Reactions   Erythromycin Nausea Only and Other (See Comments)    Abdominal pain, also   Meperidine Hcl Other (See Comments)    Made the patient feel hot and her heart raced- made her feel "flushed," also   Polyethyl Glyc-Propyl Glyc Pf Nausea And Vomiting and Other (See Comments)    RN confirmed with patient 9/4 (Miralax)   Dilaudid [Hydromorphone Hcl] Itching    Antimicrobials this admission: Cefepime 9/4 >> 9/9 Vacomycin 9/4 >> 9/9 Daptomycin 9/10 >>   Dose adjustments this admission: Daptomycin '700mg'$  ('8mg'$ /kg TBW) >> Daptomycin '500mg'$  daily ('8mg'$ /kg AdjBW)  Microbiology results: 9/6 BCx x2: No growth, final 9/8 wound, R hardware great big toe: rare staph epidermidis, Cipro (R), Oxacillin (R), tetracycline (R), Bactrim (R), Gent (I) 9/8 wound, proximal phalnx bone: Rare staph epidermidis 9/8 wound: distal phalanx bone: ngtd 9/8 wound, right great big toe joint fluid: rare  staph epidermidis  Thank you for allowing pharmacy to be a part of this patient's care.  Titus Dubin, PharmD PGY1 Pharmacy Resident 08/06/2022 9:49 AM

## 2022-08-06 NOTE — Plan of Care (Signed)
Plan of care reviewed and discussed. °

## 2022-08-06 NOTE — Telephone Encounter (Signed)
Pt states she is in the hospital and is waiting on results of the bone biopsy and pt is wanting Dr. Sherryle Lis, Dr.Wagoner, or Dr Amalia Hailey to give her a call or come by to see her to let her know what her plans are and if surgery is needed.  Please advise.

## 2022-08-06 NOTE — Progress Notes (Addendum)
Subjective: 3 Days Post-Op Procedure(s) (LRB): foot bone biopsy and hardware removal (Right) Bone pathology results are still pending.  Discussion today amputation versus limb salvage.  Patient is okay with amputation of the toe, but will wait to make any final decisions until after pathology results are available.   Podiatry will continue to follow  [name] 08/06/2022, 8:21 PM

## 2022-08-07 ENCOUNTER — Other Ambulatory Visit: Payer: Self-pay | Admitting: Rheumatology

## 2022-08-07 DIAGNOSIS — G629 Polyneuropathy, unspecified: Secondary | ICD-10-CM | POA: Diagnosis not present

## 2022-08-07 DIAGNOSIS — M8000XA Age-related osteoporosis with current pathological fracture, unspecified site, initial encounter for fracture: Secondary | ICD-10-CM

## 2022-08-07 DIAGNOSIS — T847XXD Infection and inflammatory reaction due to other internal orthopedic prosthetic devices, implants and grafts, subsequent encounter: Secondary | ICD-10-CM | POA: Diagnosis not present

## 2022-08-07 DIAGNOSIS — M14679 Charcot's joint, unspecified ankle and foot: Secondary | ICD-10-CM | POA: Diagnosis not present

## 2022-08-07 DIAGNOSIS — M14671 Charcot's joint, right ankle and foot: Secondary | ICD-10-CM | POA: Diagnosis not present

## 2022-08-07 DIAGNOSIS — M869 Osteomyelitis, unspecified: Secondary | ICD-10-CM | POA: Diagnosis not present

## 2022-08-07 LAB — SURGICAL PATHOLOGY

## 2022-08-07 NOTE — Progress Notes (Signed)
Pathology results received and it is negative for acute or chronic inflammation no evidence of osteomyelitis.  I discussed the results with Dr. [place] Bali she will see today.  With the lack of osteomyelitis I do not think amputation is necessary.  I do think we should treat this as septic arthritis status post washout and hardware removal.  I discussed this with the patient.  I will see her later today and plan to reevaluate her toe and likely place her into a weightbearing total contact cast and boot and plan for outpatient follow-up.  N.p.o. has been discontinued, diet order placed  [name], DPM 08/07/2022

## 2022-08-07 NOTE — Progress Notes (Signed)
[facility] Infectious Diseases Follow Up Note  Patient Identification: Patient Name: [name] MRN: [id number] [place] Date: 07/30/2022  1:15 PM Age: 66 y.o.Today's Date: 08/07/2022  Reason for Visit: Osteomyelitis, charcot foot   Principal Problem:   Osteomyelitis of great toe of right foot ([place]) Active Problems:   Mixed hyperlipidemia   OSA (obstructive sleep apnea)   Gout   Peripheral polyneuropathy   Bipolar disorder ([place])   Right foot pain   Infected orthopedic implant ([place])   Charcot's arthropathy of forefoot   History of Clostridioides difficile colitis   Antibiotics:  Cefepime 9/4-c Vancomycin 9/5-c  Lines/Hardwares:   Interval Events: remains afebrile   Assessment # Hardware associated Osteomyelitis/septic arthritis of rt great toe, worsening on radiograph from 9/4 and concerning for osteomyelitis in NM scan 9/7 - s/p rt great toe arthrodesis for IP joint instability with 2 screws 06/29/22 - s/p I and D, removal of deep implant and bone biopsy. Path negative for OM but multiple cultures including joint and bone + for MRSE - Also concerns for charcot foot per Podiatry Dr Sherryle Lis  # Polyarticular Gout # Bilateral Feet Neuropathy of unclear cause   Recommendations Patient states she was considering for amputation until she was told about the negative path findings for osteomyelitis. She has questions about recurrence of infection if source control through amputation is not done.I explained her that there is certainly a risk for recurrence of infection without source control even with prolonged antibiotics and will have to  monitor the course. She would like to discuss this with Podiatry.   Although path findings are negative for osteomyelitis, multiple OR cultures including joint fluid and bone positive for MRSE which would indicate need to treat as septic arthritis and osteomyelitis if no source control procedure  done  Continue Daptomycin '8mg'$ /kg as is. Monitor CPK  Will plan for PICC line and IV abtx tomorrow pending discussion of podiatry with patient. Unfortunatel, there are not much PO options for her given the resistance profile of MRSE and DDIs.  D/w Dr Sherryle Lis   Rest of the management as per the primary team. Thank you for the consult. Please page with pertinent questions or concerns.  ______________________________________________________________________ Subjective patient seen and examined at the bedside. Family member at bedside.  She says she has been struggling with her rt great toe for approx 2 years. She has never had PICC line and IV abtx so far. She has multiple questions about the different options of management with or without amputation.  Denies any fevers, chills.  Denies nausea, vomiting and diarrhea   Vitals BP 105/86 (BP Location: Right Arm)   Pulse 66   Temp 97.9 F (36.6 C) (Oral)   Resp 18   Ht 5' (1.524 m)   Wt 85.3 kg   LMP 07/27/2006   SpO2 100%   BMI 36.72 kg/m     Physical Exam Constitutional:  Nor in acute distress and obese    Comments:   Cardiovascular:     Rate and Rhythm: Normal rate and regular rhythm.     Heart sounds:  Pulmonary:     Effort: Pulmonary effort is normal.     Comments: Normal breath sounds   Abdominal:     Palpations: Abdomen is soft.     Tenderness: non distended and non tender   Musculoskeletal:        General: No swelling or tenderness. Rt great toe bandaged - sutures intact with no dehiscence, active drainage, able to move  rt great toes.   Skin:    Comments: No obvious rashes   Neurological:     General: Grossly non focal, awake, alert and oriented   Psychiatric:        Mood and Affect: Mood normal.   Past Medical History:  Diagnosis Date   Anemia    previously followed by Dr. Jamse Arn for anemia and elevated platelets   Anxiety    C. difficile colitis 10/01/2012   treated by [facility]   Chronic fatigue  syndrome    Closed wedge compression fracture of T8 vertebra ([place]) 06/2021   DDD (degenerative disc disease), lumbar 08/19/2014   and facet arthroplasty & left lumbar radiculopathy (Dr.Ramos)   Depression    Dyssynergia    dyssynergenic defecation, contributing to fecal incontinence.   Edema    Fibromyalgia    Gastroparesis    followed at [facility]   GERD (gastroesophageal reflux disease)    History of kidney stones    History of vertebral fracture 06/30/2021   Hyperlipidemia    Kidney stone    Lumbar radiculopathy    Migraine    Neuropathy    Obstructive sleep apnea    Does  wear  CPAP   Osteoporosis    Paresthesia    Dr. Everette Rank at [facility]   Pelvic floor dysfunction    pelvic floor dyssynergy   Plantar fasciitis 02/2011   R foot   Pneumonia    2012   PONV (postoperative nausea and vomiting)    pt states has gastroparesis has difficulty taking antibiotics and narcotics has severe nausea and vomiting    Restless leg syndrome    S/P endometrial ablation 08/09/2006   Novasure Ablation   S/P epidural steroid injection 09/20/2014   Dr.Ramos   Tremor    Dr. Everette Rank   Urinary frequency    Urinary incontinence      Pertinent Microbiology Results for orders placed or performed during the hospital encounter of 07/30/22  Culture, blood (Routine X 2) w Reflex to ID Panel     Status: None   Collection Time: 08/01/22  9:44 AM   Specimen: BLOOD LEFT ARM  Result Value Ref Range Status   Specimen Description   Final    BLOOD LEFT ARM Performed at [facility] [address], [place], [place] [address]    Special Requests   Final    BOTTLES DRAWN AEROBIC ONLY Blood Culture adequate volume Performed at [facility] [address], [place], [place] [address]    Culture   Final    NO GROWTH 5 DAYS Performed at [facility], [place] [address], [place], [place] [address]    Report Status 08/06/2022 FINAL  Final  Culture, blood  (Routine X 2) w Reflex to ID Panel     Status: None   Collection Time: 08/01/22 10:28 AM   Specimen: BLOOD  Result Value Ref Range Status   Specimen Description   Final    BLOOD BLOOD LEFT HAND Performed at [facility] [address], [place], [place] [address]    Special Requests   Final    Blood Culture adequate volume AEROBIC BOTTLE ONLY Performed at [facility] [address] [place], [place] [address]    Culture   Final    NO GROWTH 5 DAYS Performed at [facility], [place] [address] [place], [place] [address]    Report Status 08/06/2022 FINAL  Final  Surgical  pcr screen     Status: None   Collection Time: 08/03/22  2:09 PM   Specimen: Nasal Mucosa; Nasal Swab  Result Value Ref Range Status   MRSA, PCR NEGATIVE NEGATIVE Final   Staphylococcus aureus NEGATIVE NEGATIVE Final    Comment: (NOTE) The Xpert SA Assay (FDA approved for NASAL specimens in patients 24 years of age and older), is one component of a comprehensive surveillance program. It is not intended to diagnose infection nor to guide or monitor treatment. Performed at [facility], [place] [address], [place], [place] [address]   Aerobic/Anaerobic Culture w Gram Stain (surgical/deep wound)     Status: None (Preliminary result)   Collection Time: 08/03/22  5:59 PM   Specimen: Wound; Body Fluid  Result Value Ref Range Status   Specimen Description   Final    WOUND Performed at [facility] [address] [place], [place] [address]    Special Requests   Final    RIGHT GREAT BIG TOE JOINT FLUID Performed at [facility] [address], [place], [place] [address]    Gram Stain   Final    NO WBC SEEN RARE GRAM POSITIVE COCCI Performed at [facility], [place] [address], [place], [place] [address]    Culture   Final    RARE STAPHYLOCOCCUS EPIDERMIDIS NO ANAEROBES ISOLATED; CULTURE IN PROGRESS FOR 5 DAYS     Report Status PENDING  Incomplete   Organism ID, Bacteria STAPHYLOCOCCUS EPIDERMIDIS  Final      Susceptibility   Staphylococcus epidermidis - MIC*    CIPROFLOXACIN 4 RESISTANT Resistant     ERYTHROMYCIN <=0.25 SENSITIVE Sensitive     GENTAMICIN 8 INTERMEDIATE Intermediate     OXACILLIN >=4 RESISTANT Resistant     TETRACYCLINE >=16 RESISTANT Resistant     VANCOMYCIN 2 SENSITIVE Sensitive     TRIMETH/SULFA 80 RESISTANT Resistant     CLINDAMYCIN <=0.25 SENSITIVE Sensitive     RIFAMPIN <=0.5 SENSITIVE Sensitive     Inducible Clindamycin NEGATIVE Sensitive     * RARE STAPHYLOCOCCUS EPIDERMIDIS  Aerobic/Anaerobic Culture w Gram Stain (surgical/deep wound)     Status: None (Preliminary result)   Collection Time: 08/03/22  6:07 PM   Specimen: Wound; Body Fluid  Result Value Ref Range Status   Specimen Description   Final    WOUND Performed at [facility] [address] [place], [place] [address]    Special Requests   Final    HARDWARE RIGHT GREAT BIG TOE Performed at [facility] [address], [place], [place] [address]    Gram Stain   Final    NO ORGANISMS SEEN NO WBC SEEN Performed at [facility], [place] [address] [place], [place] [address]    Culture   Final    RARE STAPHYLOCOCCUS EPIDERMIDIS NO ANAEROBES ISOLATED; CULTURE IN PROGRESS FOR 5 DAYS    Report Status PENDING  Incomplete   Organism ID, Bacteria STAPHYLOCOCCUS EPIDERMIDIS  Final      Susceptibility   Staphylococcus epidermidis - MIC*    CIPROFLOXACIN 4 RESISTANT Resistant     ERYTHROMYCIN <=0.25 SENSITIVE Sensitive     GENTAMICIN 8 INTERMEDIATE Intermediate     OXACILLIN >=4 RESISTANT Resistant     TETRACYCLINE >=16 RESISTANT Resistant     VANCOMYCIN 2 SENSITIVE Sensitive     TRIMETH/SULFA 80 RESISTANT Resistant     CLINDAMYCIN <=0.25 SENSITIVE Sensitive     RIFAMPIN <=0.5 SENSITIVE  Sensitive     Inducible Clindamycin NEGATIVE Sensitive     * RARE  STAPHYLOCOCCUS EPIDERMIDIS  Aerobic/Anaerobic Culture w Gram Stain (surgical/deep wound)     Status: None (Preliminary result)   Collection Time: 08/03/22  6:15 PM   Specimen: Wound  Result Value Ref Range Status   Specimen Description   Final    WOUND Performed at [facility] [address], [place], [place] [address]    Special Requests   Final    NONE DISTAL PHALANX BONE Performed at [facility] [address] [place], [place] [address]    Gram Stain NO ORGANISMS SEEN NO WBC SEEN   Final   Culture   Final    NO GROWTH 4 DAYS NO ANAEROBES ISOLATED; CULTURE IN PROGRESS FOR 5 DAYS Performed at [facility], [place] [address], [place], [place] [address]    Report Status PENDING  Incomplete  Aerobic/Anaerobic Culture w Gram Stain (surgical/deep wound)     Status: None (Preliminary result)   Collection Time: 08/03/22  6:20 PM   Specimen: Wound  Result Value Ref Range Status   Specimen Description   Final    WOUND Performed at [facility] [address], [place], [place] [address]    Special Requests   Final    NONE PROXIMAL PHALANX BONE Performed at [facility], [place] [address] [place], [place] [address]    Gram Stain   Final    NO ORGANISMS SEEN NO WBC SEEN Performed at [facility], [place] [address], [place],  [address]    Culture   Final    RARE STAPHYLOCOCCUS EPIDERMIDIS SUSCEPTIBILITIES PERFORMED ON PREVIOUS CULTURE WITHIN THE LAST 5 DAYS. NO ANAEROBES ISOLATED; CULTURE IN PROGRESS FOR 5 DAYS    Report Status PENDING  Incomplete   *Note: Due to a large number of results and/or encounters for the requested time period, some results have not been displayed. A complete set of results can be found in Results Review.    Pathology 08/03/22 FINAL MICROSCOPIC DIAGNOSIS:  A. RIGHT GREAT TOE, DISTAL PHALANX, BONE BIOPSY:  Reactive trabecular bone with fibrotic marrow  Negative  for acute and chronic inflammation  Negative for tumor   B. RIGHT GREAT TOE, PROXIMAL PHALANX, BONE BIOPSY:  Reactive articular cartilage showing endochondral ossification and  reactive/new trabecular bone with fibrotic marrow  Negative for acute and chronic inflammation  Negative for tumor   Pertinent Lab.    Latest Ref Rng & Units 08/02/2022    3:41 AM 08/01/2022    3:29 AM 07/31/2022    3:17 AM  CBC  WBC 4.0 - 10.5 K/uL 9.4  7.5  9.0   Hemoglobin 12.0 - 15.0 g/dL 12.3  11.8  12.6   Hematocrit 36.0 - 46.0 % 38.4  37.2  38.8   Platelets 150 - 400 K/uL 272  278  278       Latest Ref Rng & Units 08/05/2022    3:26 AM 08/04/2022    3:13 AM 08/03/2022    4:36 AM  CMP  Glucose 70 - 99 mg/dL 101  85  94   BUN 8 - 23 mg/dL '17  16  15   '$ Creatinine 0.44 - 1.00 mg/dL 0.75  0.68  0.69   Sodium 135 - 145 mmol/L 143  143  141   Potassium 3.5 - 5.1 mmol/L 4.0  4.0  3.6   Chloride 98 - 111 mmol/L 109  108  110   CO2 22 - 32 mmol/L '28  28  26   '$ Calcium 8.9 - 10.3 mg/dL 8.8  8.8  8.4    Pertinent Imaging today Plain films and CT images have been personally visualized and interpreted; radiology reports have been reviewed. Decision making incorporated into the Impression / Recommendations.  NM WBC SCAN TUMOR LOC LIMITED  Result Date: 08/03/2022 CLINICAL DATA:  Suspicion of osteomyelitis. EXAM: NUCLEAR MEDICINE LEUKOCYTE SCAN TECHNIQUE: Following intravenous administration of radiolabeled white blood cells, images of the head, neck, trunk, and extremities were obtained on subsequent days. RADIOPHARMACEUTICALS:  30.6 technetium 99 Ceretec labeled autologous leukocytes IV COMPARISON:  Radiograph 07/30/2022 FINDINGS: There is hyperemia to the RIGHT foot on the early arterial phase imaging. Delayed imaging there is focal accumulation of tagged white blood cells in the first ray at the level of the proximal phalanx. IMPRESSION: Hyperemia to the RIGHT foot and focal accumulation of tagged white blood cells in  the proximal phalanx of the first RIGHT digit suggests infectious/inflammatory process including osteomyelitis. Electronically Signed   By: Suzy Bouchard M.D.   On: 08/03/2022 08:33   VAS [place] ABI WITH/WO TBI  Result Date: 08/01/2022  LOWER EXTREMITY DOPPLER STUDY Patient Name:  SUNI JARNAGIN  Date of Exam:   08/01/2022 Medical Rec #: [id number]      Accession #:    [id number] Date of Birth: 02-02-1956       Patient Gender: F Patient Age:   58 years Exam Location:  [facility] Procedure:      VAS [place] ABI WITH/WO TBI Referring Phys: Celesta Gentile --------------------------------------------------------------------------------  Indications: Cellulitis right foot (great toe) High Risk Factors: Hyperlipidemia.  Comparison Study: No previous exams Performing Technologist: Hill, Jody RVT, RDMS  Examination Guidelines: A complete evaluation includes at minimum, Doppler waveform signals and systolic blood pressure reading at the level of bilateral brachial, anterior tibial, and posterior tibial arteries, when vessel segments are accessible. Bilateral testing is considered an integral part of a complete examination. Photoelectric Plethysmograph (PPG) waveforms and toe systolic pressure readings are included as required and additional duplex testing as needed. Limited examinations for reoccurring indications may be performed as noted.  ABI Findings: +---------+------------------+-----+---------+------------------+ Right    Rt Pressure (mmHg)IndexWaveform Comment            +---------+------------------+-----+---------+------------------+ Brachial 132                    triphasic                   +---------+------------------+-----+---------+------------------+ PTA      154               1.12 triphasic                   +---------+------------------+-----+---------+------------------+ DP       160               1.17 triphasic                    +---------+------------------+-----+---------+------------------+ Great Toe                       Normal   too large for cuff +---------+------------------+-----+---------+------------------+ +---------+------------------+-----+---------+-------+ Left     Lt Pressure (mmHg)IndexWaveform Comment +---------+------------------+-----+---------+-------+ Brachial 137                    triphasic        +---------+------------------+-----+---------+-------+  PTA      143               1.04 triphasic        +---------+------------------+-----+---------+-------+ DP       143               1.04 triphasic        +---------+------------------+-----+---------+-------+ [place]               1.00 Normal           +---------+------------------+-----+---------+-------+ +-------+-----------+-----------+------------+------------+ ABI/TBIToday's ABIToday's TBIPrevious ABIPrevious TBI +-------+-----------+-----------+------------+------------+ Right  1.17                                           +-------+-----------+-----------+------------+------------+ Left   1.04       1.00                                +-------+-----------+-----------+------------+------------+  Unable to obtain TBI on right side due to size of toe.  Summary: Right: Resting right ankle-brachial index is within normal range. Left: Resting left ankle-brachial index is within normal range. The left toe-brachial index is normal. *See table(s) above for measurements and observations.  Electronically signed by Harold Barban MD on 08/01/2022 at 10:30:09 PM.    Final    DG Foot Complete Right  Result Date: 07/30/2022 CLINICAL DATA:  Big toe swelling. Surgery proximally 1 month ago. Progressive pain, redness and swelling EXAM: RIGHT FOOT COMPLETE - 3+ VIEW COMPARISON:  Radiograph 07/17/2022 FINDINGS: Two screws traverse the first metatarsal phalangeal joint. There is progressive lysis and erosive changes involving  the great toe interphalangeal joint, including increasing lucency adjacent to the screws. Loss of normal joint space with increasing lucency and loss of normal articular borders. There is some bony fragmentation. There is progressive soft tissue edema about the dorsum of the foot. Examination is otherwise unchanged. IMPRESSION: Increasing lucency and erosive changes about the great toe interphalangeal joint, including increasing lucency adjacent to the surgical screws. Findings suspicious for osteomyelitis/septic arthritis. Worsening radiographic findings since August 22nd exam. Electronically Signed   By: Keith Rake M.D.   On: 07/30/2022 15:27   DG Foot Complete Right  Result Date: 07/17/2022 Please see detailed radiograph report in office note.    I spent 55  minutes for this patient encounter including review of prior medical records, coordination of care with primary/other specialist with greater than 50% of time being face to face/counseling and discussing diagnostics/treatment plan with the patient/family.  Electronically signed by:   Rosiland Oz, MD Infectious Disease Physician [facility] Pager: [phone]

## 2022-08-07 NOTE — Progress Notes (Signed)
PROGRESS NOTE  [name]  NIO:[id number] DOB: 06-Mar-1956 DOA: 07/30/2022 PCP: Rita Ohara, MD   Brief Narrative:  Patient is a 66 year old female with history of depression, anxiety, chronic fatigue syndrome, degenerative disc disease, fibromyalgia, gastroparesis, GERD, hyperlipidemia, lumbar radiculopathy, neuropathy, OSA who presented with complaints of right great toe pain, swelling.  Patient recently had surgery on the toe last month but for the last couple weeks, she developed increased pain and swelling and followed-up with podiatry and infectious disease and was on antibiotics.  Since the wound was not healing, she was directed  to the emergency department by podiatrist.  X-ray showed increased lucency/erosions  around the great toe interphalangeal joint including increased lucency adjacent to the surgical screws.  Concern for osteomyelitis/osteoarthritis.  Started on broad-spectrum antibiotics.  Bone scan confirmed osteomyelitis.  Underwent foot bone biopsy, hardware removal on 9/8.  ID also consulted ,pending bone biopsy.  Assessment & Plan:  Principal Problem:   Osteomyelitis of great toe of right foot (HCC) Active Problems:   Right foot pain   Mixed hyperlipidemia   OSA (obstructive sleep apnea)   Gout   Peripheral polyneuropathy   Bipolar disorder ([place])   Infected orthopedic implant (HCC)   Charcot's arthropathy of forefoot   History of Clostridioides difficile colitis  Right great toe pain: History as above.  No significant erythema or purulence noted on admission.  There was edema, tenderness on the local site.  Concern for Charcot arthropathy.  Images findings as above.  Started on broad spectrum antibiotics.  Blood cultures,NGTD. Bone scan showed hyperemia to the RIGHT foot and focal accumulation of tagged white blood cells in the proximal phalanx of the first RIGHT digit suggesting infectious/inflammatory process including osteomyelitis. Underwent foot bone biopsy,  hardware removal on 9/8.  ID also consulted .  Wound culture has shown rare Staph epidermidis resistant to penicillin.  On daptomycin.  If bone biopsy is positive, might need amputation of the toe  Idiopathic chronic gout: On allopurinol  Bipolar disorder/anxiety/depression: On Abilify, Cymbalta, nortriptyline, Trileptal, Xanax  Hyperlipidemia: On Lipitor  OSA: CPAP at night  Neuropathy: On Gabapentin  Nausea: Continue antiemetics  Obesity: BMI of 36.7       DVT prophylaxis:enoxaparin (LOVENOX) injection 40 mg Start: 07/30/22 2000     Code Status: Full Code  Family Communication: Husband at bedside on 9/6  Patient status:Inpatient  Patient is from :Home  Anticipated discharge GH:WEXH  Estimated DC date: awaiting further management by podiatry/ID  Consultants: Podiatry  Procedures:None  Antimicrobials:  Anti-infectives (From admission, onward)    Start     Dose/Rate Route Frequency Ordered Stop   08/06/22 2000  DAPTOmycin (CUBICIN) 500 mg in sodium chloride 0.9 % IVPB        8 mg/kg  61.4 kg (Adjusted) 120 mL/hr over 30 Minutes Intravenous Daily 08/06/22 0850     08/05/22 1000  DAPTOmycin (CUBICIN) 700 mg in sodium chloride 0.9 % IVPB  Status:  Discontinued        8 mg/kg  85.3 kg 128 mL/hr over 30 Minutes Intravenous Daily 08/04/22 1523 08/06/22 0850   08/04/22 1415  fluconazole (DIFLUCAN) tablet 150 mg        150 mg Oral  Once 08/04/22 1326 08/04/22 1506   08/01/22 1200  vancomycin (VANCOCIN) IVPB 1000 mg/200 mL premix  Status:  Discontinued        1,000 mg 200 mL/hr over 60 Minutes Intravenous Every 24 hours 07/31/22 1124 08/04/22 1543   07/31/22 1630  vancomycin (  VANCOCIN) IVPB 1000 mg/200 mL premix  Status:  Discontinued        1,000 mg 200 mL/hr over 60 Minutes Intravenous Every 24 hours 07/30/22 1602 07/30/22 1604   07/31/22 1215  vancomycin (VANCOREADY) IVPB 1750 mg/350 mL        1,750 mg 175 mL/hr over 120 Minutes Intravenous  Once 07/31/22 1117  07/31/22 1457   07/30/22 1615  vancomycin (VANCOCIN) IVPB 1000 mg/200 mL premix  Status:  Discontinued       See Hyperspace for full Linked Orders Report.   1,000 mg 200 mL/hr over 60 Minutes Intravenous  Once 07/30/22 1602 07/30/22 1604   07/30/22 1615  vancomycin (VANCOCIN) IVPB 1000 mg/200 mL premix  Status:  Discontinued       See Hyperspace for full Linked Orders Report.   1,000 mg 200 mL/hr over 60 Minutes Intravenous  Once 07/30/22 1602 07/30/22 1604   07/30/22 1604  vancomycin (VANCOCIN) IVPB 1000 mg/200 mL premix  Status:  Discontinued        1,000 mg 200 mL/hr over 60 Minutes Intravenous Every 24 hours 07/30/22 1604 07/31/22 1117   07/30/22 1600  ceFEPIme (MAXIPIME) 2 g in sodium chloride 0.9 % 100 mL IVPB  Status:  Discontinued        2 g 200 mL/hr over 30 Minutes Intravenous Every 8 hours 07/30/22 1555 08/04/22 1543   07/30/22 1600  vancomycin (VANCOREADY) IVPB 1500 mg/300 mL  Status:  Discontinued        1,500 mg 150 mL/hr over 120 Minutes Intravenous  Once 07/30/22 1555 07/30/22 1602       Subjective:  Patient seen and examined at the bedside today.  Hemodynamically stable without any new complaints today. Objective: Vitals:   08/05/22 2049 08/06/22 1326 08/06/22 2031 08/07/22 0447  BP: 134/76 (!) 109/56 (!) 113/57 105/86  Pulse: 63 65 70 66  Resp: '17 14 16 18  '$ Temp: 98.1 F (36.7 C) 98.1 F (36.7 C) 97.7 F (36.5 C) 97.9 F (36.6 C)  TempSrc: Oral Oral Oral Oral  SpO2: 96% 100% 97% 100%  Weight:      Height:        Intake/Output Summary (Last 24 hours) at 08/07/2022 1143 Last data filed at 08/06/2022 2200 Gross per 24 hour  Intake 300 ml  Output --  Net 300 ml   Filed Weights   07/30/22 1219  Weight: 85.3 kg    Examination:  General exam: Overall comfortable, not in distress,obese HEENT: PERRL Respiratory system:  no wheezes or crackles  Cardiovascular system: S1 & S2 heard, RRR.  Gastrointestinal system: Abdomen is nondistended, soft and  nontender. Central nervous system: Alert and oriented Extremities: No edema, no clubbing ,no cyanosis, right foot covered with dressing Skin: No rashes, no ulcers,no icterus     Data Reviewed: I have personally reviewed following labs and imaging studies  CBC: Recent Labs  Lab 08/01/22 0329 08/02/22 0341  WBC 7.5 9.4  NEUTROABS 4.0 5.5  HGB 11.8* 12.3  HCT 37.2 38.4  MCV 94.2 93.0  PLT 278 539   Basic Metabolic Panel: Recent Labs  Lab 08/01/22 0329 08/02/22 0341 08/03/22 0436 08/04/22 0313 08/05/22 0326  NA 141 142 141 143 143  K 3.8 3.4* 3.6 4.0 4.0  CL 106 108 110 108 109  CO2 '29 27 26 28 28  '$ GLUCOSE 104* 101* 94 85 101*  BUN '15 17 15 16 17  '$ CREATININE 0.82 0.80 0.69 0.68 0.75  CALCIUM 8.8* 9.0 8.4* 8.8*  8.8*  MG 2.0 2.1  --   --   --      Recent Results (from the past 240 hour(s))  Culture, blood (Routine X 2) w Reflex to ID Panel     Status: None   Collection Time: 08/01/22  9:44 AM   Specimen: BLOOD LEFT ARM  Result Value Ref Range Status   Specimen Description   Final    BLOOD LEFT ARM Performed at [facility] [address], [place], [place] [address]    Special Requests   Final    BOTTLES DRAWN AEROBIC ONLY Blood Culture adequate volume Performed at [facility] [address], [place], [place] [address]    Culture   Final    NO GROWTH 5 DAYS Performed at [facility], [place] [address] [place], [place] [address]    Report Status 08/06/2022 FINAL  Final  Culture, blood (Routine X 2) w Reflex to ID Panel     Status: None   Collection Time: 08/01/22 10:28 AM   Specimen: BLOOD  Result Value Ref Range Status   Specimen Description   Final    BLOOD BLOOD LEFT HAND Performed at [facility] [address], [place], [place] [address]    Special Requests   Final    Blood Culture adequate volume AEROBIC BOTTLE ONLY Performed at [facility] [address],  [place], [place] [address]    Culture   Final    NO GROWTH 5 DAYS Performed at [facility], [place] [address], [place], [place] [address]    Report Status 08/06/2022 FINAL  Final  Surgical pcr screen     Status: None   Collection Time: 08/03/22  2:09 PM   Specimen: Nasal Mucosa; Nasal Swab  Result Value Ref Range Status   MRSA, PCR NEGATIVE NEGATIVE Final   Staphylococcus aureus NEGATIVE NEGATIVE Final    Comment: (NOTE) The Xpert SA Assay (FDA approved for NASAL specimens in patients 39 years of age and older), is one component of a comprehensive surveillance program. It is not intended to diagnose infection nor to guide or monitor treatment. Performed at [facility], [place] [address], [place], [place] [address]   Aerobic/Anaerobic Culture w Gram Stain (surgical/deep wound)     Status: None (Preliminary result)   Collection Time: 08/03/22  5:59 PM   Specimen: Wound; Body Fluid  Result Value Ref Range Status   Specimen Description   Final    WOUND Performed at [facility] [address] [place], [place] [address]    Special Requests   Final    RIGHT GREAT BIG TOE JOINT FLUID Performed at [facility] [address] [place], [place] [address]    Gram Stain   Final    NO WBC SEEN RARE GRAM POSITIVE COCCI Performed at [facility], [place] [address], [place],  [address]    Culture   Final    RARE STAPHYLOCOCCUS EPIDERMIDIS NO ANAEROBES ISOLATED; CULTURE IN PROGRESS FOR 5 DAYS    Report Status PENDING  Incomplete   Organism ID, Bacteria STAPHYLOCOCCUS EPIDERMIDIS  Final      Susceptibility   Staphylococcus epidermidis - MIC*    CIPROFLOXACIN 4 RESISTANT Resistant     ERYTHROMYCIN <=0.25 SENSITIVE Sensitive     GENTAMICIN 8 INTERMEDIATE Intermediate     OXACILLIN >=4 RESISTANT Resistant     TETRACYCLINE >=16 RESISTANT Resistant  VANCOMYCIN 2 SENSITIVE Sensitive     TRIMETH/SULFA 80 RESISTANT  Resistant     CLINDAMYCIN <=0.25 SENSITIVE Sensitive     RIFAMPIN <=0.5 SENSITIVE Sensitive     Inducible Clindamycin NEGATIVE Sensitive     * RARE STAPHYLOCOCCUS EPIDERMIDIS  Aerobic/Anaerobic Culture w Gram Stain (surgical/deep wound)     Status: None (Preliminary result)   Collection Time: 08/03/22  6:07 PM   Specimen: Wound; Body Fluid  Result Value Ref Range Status   Specimen Description   Final    WOUND Performed at [facility] [address], [place], [place] [address]    Special Requests   Final    HARDWARE RIGHT GREAT BIG TOE Performed at [facility] [address] [place], [place] [address]    Gram Stain   Final    NO ORGANISMS SEEN NO WBC SEEN Performed at [facility], [place] [address], [place], [place] [address]    Culture   Final    RARE STAPHYLOCOCCUS EPIDERMIDIS NO ANAEROBES ISOLATED; CULTURE IN PROGRESS FOR 5 DAYS    Report Status PENDING  Incomplete   Organism ID, Bacteria STAPHYLOCOCCUS EPIDERMIDIS  Final      Susceptibility   Staphylococcus epidermidis - MIC*    CIPROFLOXACIN 4 RESISTANT Resistant     ERYTHROMYCIN <=0.25 SENSITIVE Sensitive     GENTAMICIN 8 INTERMEDIATE Intermediate     OXACILLIN >=4 RESISTANT Resistant     TETRACYCLINE >=16 RESISTANT Resistant     VANCOMYCIN 2 SENSITIVE Sensitive     TRIMETH/SULFA 80 RESISTANT Resistant     CLINDAMYCIN <=0.25 SENSITIVE Sensitive     RIFAMPIN <=0.5 SENSITIVE Sensitive     Inducible Clindamycin NEGATIVE Sensitive     * RARE STAPHYLOCOCCUS EPIDERMIDIS  Aerobic/Anaerobic Culture w Gram Stain (surgical/deep wound)     Status: None (Preliminary result)   Collection Time: 08/03/22  6:15 PM   Specimen: Wound  Result Value Ref Range Status   Specimen Description   Final    WOUND Performed at [facility], [place] [address] [place], [place] [address]    Special Requests   Final    NONE DISTAL PHALANX BONE Performed at [facility] [address] [place], [place] [address]    Gram Stain NO ORGANISMS SEEN NO WBC SEEN   Final   Culture   Final    NO GROWTH 3 DAYS NO ANAEROBES ISOLATED; CULTURE IN PROGRESS FOR 5 DAYS Performed at [facility], [place] [address] [place], [place] [address]    Report Status PENDING  Incomplete  Aerobic/Anaerobic Culture w Gram Stain (surgical/deep wound)     Status: None (Preliminary result)   Collection Time: 08/03/22  6:20 PM   Specimen: Wound  Result Value Ref Range Status   Specimen Description   Final    WOUND Performed at [facility] [address] [place], [place] [address]    Special Requests   Final    NONE PROXIMAL PHALANX BONE Performed at [facility], [place] [address] [place], [place] [address]    Gram Stain   Final    NO ORGANISMS SEEN NO WBC SEEN Performed at [facility], [place] [address] [place], [place] [address]    Culture   Final    RARE STAPHYLOCOCCUS EPIDERMIDIS SUSCEPTIBILITIES PERFORMED ON PREVIOUS CULTURE WITHIN THE LAST 5 DAYS. NO ANAEROBES ISOLATED; CULTURE IN PROGRESS FOR 5 DAYS    Report Status PENDING  Incomplete     Radiology Studies: No results found.  Scheduled Meds:  allopurinol  300 mg Oral Daily   ARIPiprazole  5 mg Oral Daily   atorvastatin  40 mg Oral QHS   cycloSPORINE  1 drop Both Eyes BID   DULoxetine  60 mg Oral BID   enoxaparin (LOVENOX) injection  40 mg Subcutaneous Q24H   gabapentin  300 mg Oral TID   loratadine  10 mg Oral Daily   melatonin  5 mg Oral QHS   metoprolol tartrate  50 mg Oral BID   multivitamin  1 tablet Oral Daily   nortriptyline  100 mg Oral q1800   OXcarbazepine  150 mg Oral BID   pantoprazole  40 mg Oral QHS   Continuous Infusions:  DAPTOmycin (CUBICIN) 500 mg in sodium chloride 0.9 % IVPB Stopped (08/06/22 2200)     LOS: 7 days   Shelly Coss, MD [facility] P9/10/2022, 11:43 AM

## 2022-08-07 NOTE — Progress Notes (Signed)
Mobility Specialist - Progress Note   08/07/22 0958  Mobility  HOB Elevated/Bed Position Self regulated  Activity Ambulated independently in hallway  Range of Motion/Exercises Active  Level of Assistance Independent  Assistive Device None  Distance Ambulated (ft) 350 ft  Activity Response Tolerated well  Transport method Ambulatory  $Mobility charge 1 Mobility   Pt received in bed and agreeable to mobility. Pt to bed after session with all needs met.    [facility]

## 2022-08-07 NOTE — Plan of Care (Signed)
Plan of care reviewed and discussed with the patient. 

## 2022-08-07 NOTE — Progress Notes (Signed)
Subjective:  Patient ID: [name], female    DOB: October 22, 1956,  MRN: [id number]  Patient seen at bedside resting comfortably with her husband present.  She has questions about prognosis for the toe and her biopsy results.  Not having much pain.  Achilles is painful  Negative for chest pain and shortness of breath Fever: no Night sweats: no Constitutional signs: no Objective:   Vitals:   08/06/22 2031 08/07/22 0447  BP: (!) 113/57 105/86  Pulse: 70 66  Resp: 16 18  Temp: 97.7 F (36.5 C) 97.9 F (36.6 C)  SpO2: 97% 100%   General AA&O x3. Normal mood and affect.  Vascular Dorsalis pedis and posterior tibial pulses 2/4 bilat. Brisk capillary refill to all digits. Pedal hair present.  Neurologic Epicritic sensation grossly absent.  Dermatologic Incision healing well no signs of infection, sutures coapted well  Orthopedic: No pain in toe   Results for orders placed or performed during the hospital encounter of 07/30/22  Culture, blood (Routine X 2) w Reflex to ID Panel     Status: None   Collection Time: 08/01/22  9:44 AM   Specimen: BLOOD LEFT ARM  Result Value Ref Range Status   Specimen Description   Final    BLOOD LEFT ARM Performed at [facility] [address] [place], [place] [address]    Special Requests   Final    BOTTLES DRAWN AEROBIC ONLY Blood Culture adequate volume Performed at [facility] [address], [place], [place] [address]    Culture   Final    NO GROWTH 5 DAYS Performed at [facility], [place] [address] [place], [place] [address]    Report Status 08/06/2022 FINAL  Final  Culture, blood (Routine X 2) w Reflex to ID Panel     Status: None   Collection Time: 08/01/22 10:28 AM   Specimen: BLOOD  Result Value Ref Range Status   Specimen Description   Final    BLOOD BLOOD LEFT HAND Performed at [facility] [address] [place], [place] [address]    Special Requests   Final     Blood Culture adequate volume AEROBIC BOTTLE ONLY Performed at [facility] [address] [place], [place] [address]    Culture   Final    NO GROWTH 5 DAYS Performed at [facility], [place] [address] [place], [place] [address]    Report Status 08/06/2022 FINAL  Final  Surgical pcr screen     Status: None   Collection Time: 08/03/22  2:09 PM   Specimen: Nasal Mucosa; Nasal Swab  Result Value Ref Range Status   MRSA, PCR NEGATIVE NEGATIVE Final   Staphylococcus aureus NEGATIVE NEGATIVE Final    Comment: (NOTE) The Xpert SA Assay (FDA approved for NASAL specimens in patients 31 years of age and older), is one component of a comprehensive surveillance program. It is not intended to diagnose infection nor to guide or monitor treatment. Performed at [facility], [place] [address] [place], [place] [address]   Aerobic/Anaerobic Culture w Gram Stain (surgical/deep wound)     Status: None (Preliminary result)   Collection Time: 08/03/22  5:59 PM   Specimen: Wound; Body Fluid  Result Value Ref Range Status   Specimen Description   Final    WOUND Performed at [facility] [address], [place], [place] [address]    Special Requests   Final  RIGHT GREAT BIG TOE JOINT FLUID Performed at [facility], [place] [address] [place], [place] [address]    Gram Stain   Final    NO WBC SEEN RARE GRAM POSITIVE COCCI Performed at [facility], [place] [address] [place], [place] [address]    Culture   Final    RARE STAPHYLOCOCCUS EPIDERMIDIS NO ANAEROBES ISOLATED; CULTURE IN PROGRESS FOR 5 DAYS    Report Status PENDING  Incomplete   Organism ID, Bacteria STAPHYLOCOCCUS EPIDERMIDIS  Final      Susceptibility   Staphylococcus epidermidis - MIC*    CIPROFLOXACIN 4 RESISTANT Resistant     ERYTHROMYCIN <=0.25 SENSITIVE Sensitive     GENTAMICIN 8 INTERMEDIATE Intermediate     OXACILLIN >=4 RESISTANT  Resistant     TETRACYCLINE >=16 RESISTANT Resistant     VANCOMYCIN 2 SENSITIVE Sensitive     TRIMETH/SULFA 80 RESISTANT Resistant     CLINDAMYCIN <=0.25 SENSITIVE Sensitive     RIFAMPIN <=0.5 SENSITIVE Sensitive     Inducible Clindamycin NEGATIVE Sensitive     * RARE STAPHYLOCOCCUS EPIDERMIDIS  Aerobic/Anaerobic Culture w Gram Stain (surgical/deep wound)     Status: None (Preliminary result)   Collection Time: 08/03/22  6:07 PM   Specimen: Wound; Body Fluid  Result Value Ref Range Status   Specimen Description   Final    WOUND Performed at [facility] [address], [place], [place] [address]    Special Requests   Final    HARDWARE RIGHT GREAT BIG TOE Performed at [facility] [address] [place], [place] [address]    Gram Stain   Final    NO ORGANISMS SEEN NO WBC SEEN Performed at [facility], [place] [address] [place], [place] [address]    Culture   Final    RARE STAPHYLOCOCCUS EPIDERMIDIS NO ANAEROBES ISOLATED; CULTURE IN PROGRESS FOR 5 DAYS    Report Status PENDING  Incomplete   Organism ID, Bacteria STAPHYLOCOCCUS EPIDERMIDIS  Final      Susceptibility   Staphylococcus epidermidis - MIC*    CIPROFLOXACIN 4 RESISTANT Resistant     ERYTHROMYCIN <=0.25 SENSITIVE Sensitive     GENTAMICIN 8 INTERMEDIATE Intermediate     OXACILLIN >=4 RESISTANT Resistant     TETRACYCLINE >=16 RESISTANT Resistant     VANCOMYCIN 2 SENSITIVE Sensitive     TRIMETH/SULFA 80 RESISTANT Resistant     CLINDAMYCIN <=0.25 SENSITIVE Sensitive     RIFAMPIN <=0.5 SENSITIVE Sensitive     Inducible Clindamycin NEGATIVE Sensitive     * RARE STAPHYLOCOCCUS EPIDERMIDIS  Aerobic/Anaerobic Culture w Gram Stain (surgical/deep wound)     Status: None (Preliminary result)   Collection Time: 08/03/22  6:15 PM   Specimen: Wound  Result Value Ref Range Status   Specimen Description   Final    WOUND Performed at [facility], [place] [address] [place], [place] [address]    Special Requests   Final    NONE DISTAL PHALANX BONE Performed at [facility] [address], [place], [place] [address]    Gram Stain NO ORGANISMS SEEN NO WBC SEEN   Final   Culture   Final    NO GROWTH 4 DAYS NO ANAEROBES ISOLATED; CULTURE IN PROGRESS FOR 5 DAYS Performed at [facility], [place] [address], [place], [place] [address]    Report Status PENDING  Incomplete  Aerobic/Anaerobic Culture w Gram Stain (surgical/deep wound)  Status: None (Preliminary result)   Collection Time: 08/03/22  6:20 PM   Specimen: Wound  Result Value Ref Range Status   Specimen Description   Final    WOUND Performed at [facility] [address], [place], [place] [address]    Special Requests   Final    NONE PROXIMAL PHALANX BONE Performed at [facility], [place] [address], [place], [place] [address]    Gram Stain   Final    NO ORGANISMS SEEN NO WBC SEEN Performed at [facility], [place] [address], [place], [place] [address]    Culture   Final    RARE STAPHYLOCOCCUS EPIDERMIDIS SUSCEPTIBILITIES PERFORMED ON PREVIOUS CULTURE WITHIN THE LAST 5 DAYS. NO ANAEROBES ISOLATED; CULTURE IN PROGRESS FOR 5 DAYS    Report Status PENDING  Incomplete   *Note: Due to a large number of results and/or encounters for the requested time period, some results have not been displayed. A complete set of results can be found in Results Review.   PATHOLOGY SURGICAL PATHOLOGY  CASE: [id number]  PATIENT: Briyonna Leech  Surgical Pathology Report      Clinical History: infection      FINAL MICROSCOPIC DIAGNOSIS:   A. RIGHT GREAT TOE, DISTAL PHALANX, BONE BIOPSY:  Reactive trabecular bone with fibrotic marrow  Negative for acute and chronic inflammation  Negative for tumor   B. RIGHT GREAT TOE, PROXIMAL PHALANX, BONE BIOPSY:  Reactive articular cartilage showing endochondral ossification and   reactive/new trabecular bone with fibrotic marrow  Negative for acute and chronic inflammation  Negative for tumor     Assessment & Plan:  Patient was evaluated and treated and all questions answered.  Right hallux postoperative infection, possible Charcot status post I&D, hardware removal, biopsy -I reviewed the results of the biopsy and culture results.  We discussed that the samples taken did not show any osteomyelitis or inflammation or bone death, however we have to treat for presumptive infection with positive bone cultures.  I discussed her case with Dr. [place] Bali from infectious disease and we will plan for 4 to 6 weeks IV antibiotics for multidrug-resistant staph epidermidis.  Discussed with the patient and her husband that if the toe resumes its previous state of inflammation and erythema and pain following this.  Then likely amputation would be necessary at that point. -At the same time plan to treat for possible Charcot arthropathy.  Placed into total contact cast at bedside.  May be WBAT in boot with cast -Outpatient follow-up will be scheduled for cast change in 1 week, will see me in 2 weeks for reevaluation and suture removal  Criselda Peaches, DPM  Accessible via secure chat for questions or concerns.

## 2022-08-07 NOTE — Care Management Important Message (Signed)
Important Message  Patient Details IM Letter given to the Patient. Name: [name] MRN: [id number] Date of Birth: 1955-12-21   Medicare Important Message Given:  Yes     Kerin Salen 08/07/2022, 1:25 PM

## 2022-08-07 NOTE — TOC Progression Note (Signed)
Transition of Care [facility]) - Progression Note    Patient Details  Name: [name] MRN: [id number] Date of Birth: Dec 31, 1955  Transition of Care [facility]) CM/SW Contact  Ross Ludwig, [place] Phone Number: 08/06/2022, 7:11 PM  Clinical Narrative:    CSW continuing to follow patient's progress throughout discharge planning.     Barriers to Discharge: Continued Medical Work up  Expected Discharge Plan and Services                                                 Social Determinants of Health (SDOH) Interventions    Readmission Risk Interventions     No data to display

## 2022-08-08 ENCOUNTER — Inpatient Hospital Stay: Payer: Self-pay

## 2022-08-08 DIAGNOSIS — M86171 Other acute osteomyelitis, right ankle and foot: Secondary | ICD-10-CM | POA: Diagnosis not present

## 2022-08-08 DIAGNOSIS — M869 Osteomyelitis, unspecified: Secondary | ICD-10-CM | POA: Diagnosis not present

## 2022-08-08 LAB — AEROBIC/ANAEROBIC CULTURE W GRAM STAIN (SURGICAL/DEEP WOUND)
Culture: NO GROWTH
Gram Stain: NONE SEEN
Gram Stain: NONE SEEN
Gram Stain: NONE SEEN

## 2022-08-08 MED ORDER — SODIUM CHLORIDE 0.9% FLUSH: 10.0000 mL | INTRAVENOUS | Status: DC | PRN

## 2022-08-08 MED ORDER — HEPARIN SOD (PORK) LOCK FLUSH 100 UNIT/ML IV SOLN
250.0000 [IU] | INTRAVENOUS | Status: AC | PRN
  Administered 2022-08-08: 250 [IU]

## 2022-08-08 MED ORDER — DAPTOMYCIN IV (FOR PTA / DISCHARGE USE ONLY): 500.0000 mg | INTRAVENOUS | 0 refills | Status: AC

## 2022-08-08 MED ORDER — CHLORHEXIDINE GLUCONATE CLOTH 2 % EX PADS: 6.0000 | MEDICATED_PAD | Freq: Every day | CUTANEOUS | Status: DC

## 2022-08-08 MED ORDER — HYDROCODONE-ACETAMINOPHEN 5-325 MG PO TABS: 1.0000 | ORAL_TABLET | ORAL | 0 refills | Status: AC | PRN

## 2022-08-08 MED ORDER — ACETAMINOPHEN 325 MG PO TABS: 650.0000 mg | ORAL_TABLET | ORAL | Status: DC | PRN

## 2022-08-08 NOTE — Progress Notes (Signed)
Patient discharged to home w/ family. Given all belongings, instructions, prescriptions, equipment. Verbalized understanding of instructions. Escorted to pov via w/c.

## 2022-08-08 NOTE — Progress Notes (Signed)
Mobility Specialist Cancellation/Refusal Note:    08/08/22 1023  Mobility  Activity Refused mobility     Reason for Cancellation/Refusal: Pt declined mobility at this time. Pt going home today.  Will check back as schedule permits.       [facility]

## 2022-08-08 NOTE — Progress Notes (Signed)
[facility] Infectious Diseases Follow Up Note  Patient Identification: Patient Name: [name] MRN: [id number] [place] Date: 07/30/2022  1:15 PM Age: 66 y.o.Today's Date: 08/08/2022  Reason for Visit: Osteomyelitis, charcot foot   Principal Problem:   Osteomyelitis of great toe of right foot ([place]) Active Problems:   Mixed hyperlipidemia   OSA (obstructive sleep apnea)   Gout   Peripheral polyneuropathy   Bipolar disorder ([place])   Right foot pain   Infected orthopedic implant ([place])   Charcot's arthropathy of forefoot   History of Clostridioides difficile colitis   Antibiotics:  Cefepime 9/4-9/9 Vancomycin 9/5-9/9, daptomycin 9/10-c  Lines/Hardwares -bilateral TKA  Interval Events: remains afebrile, path 9/8 negative for Osteomyelitis    Assessment # Hardware associated Osteomyelitis/septic arthritis of rt great toe, worsening on radiograph from 9/4 and concerning for osteomyelitis in NM scan 9/7 - s/p rt great toe arthrodesis for IP joint instability with 2 screws 06/29/22 - s/p I and D, removal of deep implant and bone biopsy. Path negative for OM but multiple cultures including joint and bone + for MRSE - samples taken did not show any osteomyelitis or inflammation or bone death but has concerns for charcot foot per Podiatry Dr Sherryle Lis  # h/o C diff colitis # Polyarticular Gout # Bilateral Feet Neuropathy of unclear cause   Recommendations Discussed with Dr Billee Cashing donald last evening as well as patient this morning, consensus is plan for one prolonged course of 6 weeks of IV daptomycin from 9/8 through PICC and fu to see for improvement.  If no improvement/worsening findings, plan for amputation at that time. Monitor CPK  I will order PICC  ID pharmacy to place OPAT orders Will fu OR cx to completion and arrange for ID fu  ID will sign off for now, please call with questions   Rest of the management as per the primary  team. Thank you for the consult. Please page with pertinent questions or concerns.  ______________________________________________________________________ Subjective patient seen and examined at the bedside. No complaints  She is willing for one prolonged course of IV abtx   Vitals BP 102/64 (BP Location: Right Arm)   Pulse 98   Temp (!) 97.5 F (36.4 C) (Oral)   Resp 17   Ht 5' (1.524 m)   Wt 85.3 kg   LMP 07/27/2006   SpO2 98%   BMI 36.72 kg/m     Physical Exam Constitutional:  Nor in acute distress and obese    Comments:   Cardiovascular:     Rate and Rhythm: Normal rate and regular rhythm.     Heart sounds:  Pulmonary:     Effort: Pulmonary effort is normal.     Comments: Normal breath sounds   Abdominal:     Palpations: Abdomen is soft.     Tenderness: non distended and non tender   Musculoskeletal:        General: No swelling or tenderness. Rt great foot in a total contact cast and immobilizer   Skin:    Comments: No obvious rashes   Neurological:     General: Grossly non focal, awake, alert and oriented   Psychiatric:        Mood and Affect: Mood normal.   Past Medical History:  Diagnosis Date   Anemia    previously followed by Dr. Jamse Arn for anemia and elevated platelets   Anxiety    C. difficile colitis 10/01/2012   treated by [facility]   Chronic fatigue syndrome  Closed wedge compression fracture of T8 vertebra ([place]) 06/2021   DDD (degenerative disc disease), lumbar 08/19/2014   and facet arthroplasty & left lumbar radiculopathy (Dr.Ramos)   Depression    Dyssynergia    dyssynergenic defecation, contributing to fecal incontinence.   Edema    Fibromyalgia    Gastroparesis    follow[facility] at [facility]   GERD (gastroesophageal reflux disease)    History of kidney stones    History of vertebral fracture 06/30/2021   Hyperlipidemia    Kidney stone    Lumbar radiculopathy    Migraine    Neuropathy    Obstructive sleep apnea    Does  wear  CPAP    Osteoporosis    Paresthesia    Dr. Everette Rank at [facility]   Pelvic floor dysfunction    pelvic floor dyssynergy   Plantar fasciitis 02/2011   R foot   Pneumonia    2012   PONV (postoperative nausea and vomiting)    pt states has gastroparesis has difficulty taking antibiotics and narcotics has severe nausea and vomiting    Restless leg syndrome    S/P endometrial ablation 08/09/2006   Novasure Ablation   S/P epidural steroid injection 09/20/2014   Dr.Ramos   Tremor    Dr. Everette Rank   Urinary frequency    Urinary incontinence    Past Surgical History:  Procedure Laterality Date   CHOLECYSTECTOMY  07/2004   ENDOMETRIAL ABLATION  08/09/2006   Dr. Valentina Shaggy Ablation   FACET JOINT INJECTION  04/17/2017   Left L4-5 and L5-S1   FOOT SURGERY Right 06/29/2022   FUSION OF JOINT IN BIG TOE RT FOOT, Dr. Sherryle Lis   GASTRIC ROUX-EN-Y N/A 09/29/2019   Procedure: LAPAROSCOPIC ROUX-EN-Y GASTRIC BYPASS WITH UPPER ENDOSCOPY, ERAS Pathway;  Surgeon: Johnathan Hausen, MD;  Location: [facility];  Service: General;  Laterality: N/A;   IRRIGATION AND DEBRIDEMENT FOOT Right 08/03/2022   Procedure: foot bone biopsy and hardware removal;  Surgeon: Criselda Peaches, DPM;  Location: [facility];  Service: Podiatry;  Laterality: Right;   KNEE ARTHROPLASTY     KNEE SURGERY  1999   R knee, Dr. Eddie Dibbles, torn cartilage   KYPHOPLASTY  11/06/2021   T8-T9-Nundkumar   RETINAL LASER PROCEDURE Right 08/28/2018   laser retinopexy   RIGHT/LEFT HEART CATH AND CORONARY ANGIOGRAPHY N/A 01/01/2018   Procedure: RIGHT/LEFT HEART CATH AND CORONARY ANGIOGRAPHY;  Surgeon: Sherren Mocha, MD;  Location: [facility];  Service: Cardiovascular;  Laterality: N/A;   TONSILLECTOMY  1968   TONSILLECTOMY     TOTAL KNEE ARTHROPLASTY Right 09/05/2015   Procedure: RIGHT TOTAL KNEE ARTHROPLASTY;  Surgeon: Gaynelle Arabian, MD;  Location: [facility];  Service: Orthopedics;  Laterality: Right;   TOTAL KNEE ARTHROPLASTY Left 07/14/2018    Procedure: LEFT TOTAL KNEE ARTHROPLASTY;  Surgeon: Gaynelle Arabian, MD;  Location: [facility];  Service: Orthopedics;  Laterality: Left;   ULTRASOUND GUIDANCE FOR VASCULAR ACCESS  01/01/2018   Procedure: Ultrasound Guidance For Vascular Access;  Surgeon: Sherren Mocha, MD;  Location: [facility];  Service: Cardiovascular;;   UMBILICAL HERNIA REPAIR N/A 09/29/2019   Procedure: HERNIA REPAIR UMBILICAL ADULT;  Surgeon: Johnathan Hausen, MD;  Location: [facility];  Service: General;  Laterality: N/A;     Pertinent Microbiology Results for orders placed or performed during the hospital encounter of 07/30/22  Culture, blood (Routine X 2) w Reflex to ID Panel     Status: None   Collection Time: 08/01/22  9:44 AM  Specimen: BLOOD LEFT ARM  Result Value Ref Range Status   Specimen Description   Final    BLOOD LEFT ARM Performed at [place] [address] [place], [place] [address]    Special Requests   Final    BOTTLES DRAWN AEROBIC ONLY Blood Culture adequate volume Performed at [place] [address] [place], [place] [address]    Culture   Final    NO GROWTH 5 DAYS Performed at [facility], [place] [address], [place], [place] [address]    Report Status 08/06/2022 FINAL  Final  Culture, blood (Routine X 2) w Reflex to ID Panel     Status: None   Collection Time: 08/01/22 10:28 AM   Specimen: BLOOD  Result Value Ref Range Status   Specimen Description   Final    BLOOD BLOOD LEFT HAND Performed at [place] [address], [place], [place] [address]    Special Requests   Final    Blood Culture adequate volume AEROBIC BOTTLE ONLY Performed at [place] [address], [place], [place] [address]    Culture   Final    NO GROWTH 5 DAYS Performed at [facility], [place] [address] [place], [place] [address]    Report Status 08/06/2022 FINAL  Final  Surgical pcr screen     Status:  None   Collection Time: 08/03/22  2:09 PM   Specimen: Nasal Mucosa; Nasal Swab  Result Value Ref Range Status   MRSA, PCR NEGATIVE NEGATIVE Final   Staphylococcus aureus NEGATIVE NEGATIVE Final    Comment: (NOTE) The Xpert SA Assay (FDA approved for NASAL specimens in patients 28 years of age and older), is one component of a comprehensive surveillance program. It is not intended to diagnose infection nor to guide or monitor treatment. Performed at [facility], [place] [address] [place], [place] [address]   Aerobic/Anaerobic Culture w Gram Stain (surgical/deep wound)     Status: None (Preliminary result)   Collection Time: 08/03/22  5:59 PM   Specimen: Wound; Body Fluid  Result Value Ref Range Status   Specimen Description   Final    WOUND Performed at [place] [address], [place], [place] [address]    Special Requests   Final    RIGHT GREAT BIG TOE JOINT FLUID Performed at [facility] [address], [place], [place] [address]    Gram Stain   Final    NO WBC SEEN RARE GRAM POSITIVE COCCI Performed at [facility], [place] [address], [place], [place] [address]    Culture   Final    RARE STAPHYLOCOCCUS EPIDERMIDIS NO ANAEROBES ISOLATED; CULTURE IN PROGRESS FOR 5 DAYS    Report Status PENDING  Incomplete   Organism ID, Bacteria STAPHYLOCOCCUS EPIDERMIDIS  Final      Susceptibility   Staphylococcus epidermidis - MIC*    CIPROFLOXACIN 4 RESISTANT Resistant     ERYTHROMYCIN <=0.25 SENSITIVE Sensitive     GENTAMICIN 8 INTERMEDIATE Intermediate     OXACILLIN >=4 RESISTANT Resistant     TETRACYCLINE >=16 RESISTANT Resistant     VANCOMYCIN 2 SENSITIVE Sensitive     TRIMETH/SULFA 80 RESISTANT Resistant     CLINDAMYCIN <=0.25 SENSITIVE Sensitive     RIFAMPIN <=0.5 SENSITIVE Sensitive     Inducible Clindamycin NEGATIVE Sensitive     * RARE STAPHYLOCOCCUS EPIDERMIDIS  Aerobic/Anaerobic Culture w Gram Stain  (  surgical/deep wound)     Status: None (Preliminary result)   Collection Time: 08/03/22  6:07 PM   Specimen: Wound; Body Fluid  Result Value Ref Range Status   Specimen Description   Final    WOUND Performed at [facility] [address], [place], [place] [address]    Special Requests   Final    HARDWARE RIGHT GREAT BIG TOE Performed at [place] [address] [place], [place] [address]    Gram Stain   Final    NO ORGANISMS SEEN NO WBC SEEN Performed at [facility], [place] [address] [place], [place] [address]    Culture   Final    RARE STAPHYLOCOCCUS EPIDERMIDIS NO ANAEROBES ISOLATED; CULTURE IN PROGRESS FOR 5 DAYS    Report Status PENDING  Incomplete   Organism ID, Bacteria STAPHYLOCOCCUS EPIDERMIDIS  Final      Susceptibility   Staphylococcus epidermidis - MIC*    CIPROFLOXACIN 4 RESISTANT Resistant     ERYTHROMYCIN <=0.25 SENSITIVE Sensitive     GENTAMICIN 8 INTERMEDIATE Intermediate     OXACILLIN >=4 RESISTANT Resistant     TETRACYCLINE >=16 RESISTANT Resistant     VANCOMYCIN 2 SENSITIVE Sensitive     TRIMETH/SULFA 80 RESISTANT Resistant     CLINDAMYCIN <=0.25 SENSITIVE Sensitive     RIFAMPIN <=0.5 SENSITIVE Sensitive     Inducible Clindamycin NEGATIVE Sensitive     * RARE STAPHYLOCOCCUS EPIDERMIDIS  Aerobic/Anaerobic Culture w Gram Stain (surgical/deep wound)     Status: None (Preliminary result)   Collection Time: 08/03/22  6:15 PM   Specimen: Wound  Result Value Ref Range Status   Specimen Description   Final    WOUND Performed at [facility], [place] [address] [place], [place] [address]    Special Requests   Final    NONE DISTAL PHALANX BONE Performed at [place] [address], [place], [place] [address]    Gram Stain NO ORGANISMS SEEN NO WBC SEEN   Final   Culture   Final    NO GROWTH 4 DAYS NO ANAEROBES ISOLATED; CULTURE IN PROGRESS FOR 5 DAYS Performed at  [facility], [place] [address], [place], [place] [address]    Report Status PENDING  Incomplete  Aerobic/Anaerobic Culture w Gram Stain (surgical/deep wound)     Status: None (Preliminary result)   Collection Time: 08/03/22  6:20 PM   Specimen: Wound  Result Value Ref Range Status   Specimen Description   Final    WOUND Performed at [facility] [address] [place], [place] [address]    Special Requests   Final    NONE PROXIMAL PHALANX BONE Performed at [facility], [place] [address] [place], [place] [address]    Gram Stain   Final    NO ORGANISMS SEEN NO WBC SEEN Performed at [facility], [place] [address] [place], [place] [address]    Culture   Final    RARE STAPHYLOCOCCUS EPIDERMIDIS SUSCEPTIBILITIES PERFORMED ON PREVIOUS CULTURE WITHIN THE LAST 5 DAYS. NO ANAEROBES ISOLATED; CULTURE IN PROGRESS FOR 5 DAYS    Report Status PENDING  Incomplete   *Note: Due to a large number of results and/or encounters for the requested time period, some results have not been displayed. A complete set of results can be found in Results Review.    Pathology 08/03/22 FINAL MICROSCOPIC DIAGNOSIS:  A. RIGHT GREAT TOE, DISTAL PHALANX,  BONE BIOPSY:  Reactive trabecular bone with fibrotic marrow  Negative for acute and chronic inflammation  Negative for tumor   B. RIGHT GREAT TOE, PROXIMAL PHALANX, BONE BIOPSY:  Reactive articular cartilage showing endochondral ossification and  reactive/new trabecular bone with fibrotic marrow  Negative for acute and chronic inflammation  Negative for tumor   Pertinent Lab.    Latest Ref Rng & Units 08/02/2022    3:41 AM 08/01/2022    3:29 AM 07/31/2022    3:17 AM  CBC  WBC 4.0 - 10.5 K/uL 9.4  7.5  9.0   Hemoglobin 12.0 - 15.0 g/dL 12.3  11.8  12.6   Hematocrit 36.0 - 46.0 % 38.4  37.2  38.8   Platelets 150 - 400 K/uL 272  278  278       Latest Ref Rng & Units 08/05/2022    3:26 AM 08/04/2022    3:13 AM  08/03/2022    4:36 AM  CMP  Glucose 70 - 99 mg/dL 101  85  94   BUN 8 - 23 mg/dL '17  16  15   '$ Creatinine 0.44 - 1.00 mg/dL 0.75  0.68  0.69   Sodium 135 - 145 mmol/L 143  143  141   Potassium 3.5 - 5.1 mmol/L 4.0  4.0  3.6   Chloride 98 - 111 mmol/L 109  108  110   CO2 22 - 32 mmol/L '28  28  26   '$ Calcium 8.9 - 10.3 mg/dL 8.8  8.8  8.4    Pertinent Imaging today Plain films and CT images have been personally visualized and interpreted; radiology reports have been reviewed. Decision making incorporated into the Impression / Recommendations.  [place] EKG SITE RITE  Result Date: 08/08/2022 If Site Rite image not attached, placement could not be confirmed due to current cardiac rhythm.    I spent 55  minutes for this patient encounter including review of prior medical records, coordination of care with primary/other specialist with greater than 50% of time being face to face/counseling and discussing diagnostics/treatment plan with the patient/family.  Electronically signed by:   Rosiland Oz, MD Infectious Disease Physician [facility] for Infectious Disease Pager: [phone]

## 2022-08-08 NOTE — Discharge Summary (Signed)
Physician Discharge Summary   [name] IOX:[id number] DOB: Jul 23, 1956 DOA: 07/30/2022  PCP: Rita Ohara, MD  Admit date: 07/30/2022 Discharge date: 08/08/2022 Barriers to discharge: none  Admitted From: Home Disposition:  Home with [facility] Discharging physician: Dwyane Dee, MD  Recommendations for Outpatient Follow-up:  Follow up with podiatry   Home Health: RN Equipment/Devices:   Discharge Condition: stable CODE STATUS: Full Diet recommendation:  Diet Orders (From admission, onward)     Start     Ordered   08/08/22 0000  Diet general        08/08/22 1331   08/07/22 1156  Diet regular Room service appropriate? Yes; Fluid consistency: Thin  Diet effective now       Question Answer Comment  Room service appropriate? Yes   Fluid consistency: Thin      08/07/22 1155            Hospital Course: [name] is a 66 yo female with PMH depression/anxiety, chronic fatigue syndrome, DDD, fibromyalgia, gastroparesis, GERD, HLD, lumbar radiculopathy, neuropathy, OSA, osteoporosis, plantar fasciitis, RLS. Presenting with right great toe pain and swelling. She had surgery on that toe last month. Over the last couple of weeks, she had developed increased swelling and pain. She has followed with podiatry and ID on this matter and had intermittent abx. However the swelling and pain persisted. She has not re-injured her toe. She has not had any new drainage. She has not had any fever.  She discussed with podiatry and was recommended to present to the ER for further evaluation. She was started on antibiotics on admission. X-ray showed increasing lucency and erosive changes around the great toe interphalangeal joint including increasing lucency adjacent to the surgical screws.  This raised concern for possible underlying osteomyelitis or septic arthritis.   There has also been some concern for possible underlying Charcot arthropathy as well. She ultimately underwent removal of deep implant of  the right great toe along with bone biopsy and irrigation and lavage of surgical site on 08/04/2022. Hardware cultures and wound cultures grew staph epi.  She was followed by infectious disease as well.  She was recommended to continue on a 6-week course of daptomycin with first day of therapy considered 08/03/2022.  End date 09/14/2022. She was placed into a total contact cast by podiatry prior to discharge with weightbearing as tolerated recommendations in the boot with cast. She will follow-up outpatient for a cast change in 1 week and ongoing outpatient follow-up with podiatry.    The patient's chronic medical conditions were treated accordingly per the patient's home medication regimen except as noted.  On day of discharge, patient was felt deemed stable for discharge. Patient/family member advised to call PCP or come back to ER if needed.   Principal Diagnosis: Osteomyelitis of great toe of right foot [facility])  Discharge Diagnoses: Active Hospital Problems   Diagnosis Date Noted   Osteomyelitis of great toe of right foot ([place]) 07/30/2022   Right foot pain 07/31/2022    Priority: 1.   History of Clostridioides difficile colitis    Infected orthopedic implant [facility])    Charcot's arthropathy of forefoot    Bipolar disorder ([place]) 07/30/2022   Peripheral polyneuropathy 01/22/2021   Gout 05/30/2016   OSA (obstructive sleep apnea) 03/16/2013   Mixed hyperlipidemia     Resolved Hospital Problems  No resolved problems to display.     Discharge Instructions     [facility] pharmacist to adjust dose for Vancomycin, Aminoglycosides and  other anti-infective therapies as requested by physician.   Complete by: As directed    [facility] to provide Cath Flo 38m   Complete by: As directed    Administer for PICC line occlusion and as ordered by physician for other access device issues.   Anaphylaxis Kit: Provided to treat any anaphylactic reaction to the medication being  provided to the patient if First Dose or when requested by physician   Complete by: As directed    Epinephrine 118mml vial / amp: Administer 0.10m12m0.10ml97mubcutaneously once for moderate to severe anaphylaxis, nurse to call physician and pharmacy when reaction occurs and call 911 if needed for immediate care   Diphenhydramine 50mg54mIV vial: Administer 25-50mg 36mM PRN for first dose reaction, rash, itching, mild reaction, nurse to call physician and pharmacy when reaction occurs   Sodium Chloride 0.9% NS 500ml I82mdminister if needed for hypovolemic blood pressure drop or as ordered by physician after call to physician with anaphylactic reaction   Change dressing on IV access line weekly and PRN   Complete by: As directed    Diet general   Complete by: As directed    Discharge wound care:   Complete by: As directed    Continue cast in place. Follow up with orthopedic surgery 1 week after discharge   Flush IV access with Sodium Chloride 0.9% and Heparin 10 units/ml or 100 units/ml   Complete by: As directed    Home infusion instructions - [facility]   Complete by: As directed    Instructions: Flush IV access with Sodium Chloride 0.9% and Heparin 10units/ml or 100units/ml   Change dressing on IV access line: Weekly and PRN   Instructions Cath Flo 2mg: Ad21mister for PICC Line occlusion and as ordered by physician for other access device   [facility] pharmacist to adjust dose for: Vancomycin, Aminoglycosides and other anti-infective therapies as requested by physician   Increase activity slowly   Complete by: As directed    Method of administration may be changed at the discretion of home infusion pharmacist based upon assessment of the patient and/or caregiver's ability to self-administer the medication ordered   Complete by: As directed       Allergies as of 08/08/2022       Reactions   Erythromycin Nausea Only, Other (See Comments)   Abdominal pain, also    Meperidine Hcl Other (See Comments)   Made the patient feel hot and her heart raced- made her feel "flushed," also   Polyethyl Glyc-propyl Glyc Pf Nausea And Vomiting, Other (See Comments)   RN confirmed with patient 9/4 (Miralax)   Dilaudid [hydromorphone Hcl] Itching        Medication List     STOP taking these medications    cefadroxil 500 MG capsule Commonly known as: DURICEF       TAKE these medications    acetaminophen 325 MG tablet Commonly known as: TYLENOL Take 2 tablets (650 mg total) by mouth every 4 (four) hours as needed for mild pain or moderate pain (or Fever >/= 101). What changed:  medication strength how much to take when to take this reasons to take this   albuterol 108 (90 Base) MCG/ACT inhaler Commonly known as: VENTOLIN HFA TAKE 2 PUFFS BY MOUTH EVERY 6 HOURS AS NEEDED FOR WHEEZE OR SHORTNESS OF BREATH What changed: See the new instructions.   allopurinol 300 MG tablet Commonly known as: ZYLOPRIM Take 1 tablet (300 mg total) by  mouth daily.   ALPRAZolam 0.25 MG tablet Commonly known as: XANAX Take 1 tablet (0.25 mg total) by mouth 3 (three) times daily as needed for anxiety. What changed: when to take this   ARIPiprazole 5 MG tablet Commonly known as: ABILIFY Take 1 tablet (5 mg total) by mouth daily.   Armodafinil 250 MG tablet Take 1 tablet by mouth daily with breakfast. What changed:  how much to take how to take this when to take this reasons to take this   aspirin EC 81 MG tablet Take 81 mg by mouth in the morning. Swallow whole.   atorvastatin 40 MG tablet Commonly known as: LIPITOR Take 1 tablet (40 mg total) by mouth daily. What changed: when to take this   B-D UF III MINI PEN NEEDLES 31G X 5 MM Misc Generic drug: Insulin Pen Needle USE TO INJECT FORTEO ONCE DAILY. DISCARD AFTER USE.   BARIATRIC MULTIVITAMINS/IRON PO Take 1 each by mouth in the morning and at bedtime.   PreserVision AREDS 2 Caps Take 1 capsule by  mouth in the morning and at bedtime.   CALCIUM PO Take 500 mg by mouth in the morning, at noon, and at bedtime.   cetirizine 10 MG tablet Commonly known as: ZYRTEC Take 10 mg by mouth at bedtime.   colchicine 0.6 MG tablet Take 1.73m (2 tablets) then 0.63m(1 tablet) 1 hour after. Then, take 1 tablet every day for 7 days. What changed:  how much to take how to take this when to take this additional instructions   daptomycin  IVPB Commonly known as: CUBICIN Inject 500 mg into the vein daily. Indication:  R-great osteo/septic joint First Dose: Yes Last Day of Therapy:  09/14/22 Labs - Once weekly:  CBC/D, BMP, and CPK Labs - Every other week:  ESR and CRP Method of administration: IV Push Pull PICC line at the completion of IV antibiotics Method of administration may be changed at the discretion of home infusion pharmacist based upon assessment of the patient and/or caregiver's ability to self-administer the medication ordered.   DULoxetine 60 MG capsule Commonly known as: CYMBALTA Take 1 capsule (60 mg total) by mouth 2 (two) times daily. What changed: when to take this   estradiol 0.1 MG/GM vaginal cream Commonly known as: ESTRACE VAGINAL Place 1 g vaginally 2 (two) times a week.   fluticasone 50 MCG/ACT nasal spray Commonly known as: FLONASE PLACE 2 SPRAYS INTO BOTH NOSTRILS DAILY AS NEEDED FOR ALLERGIES What changed: See the new instructions.   Forteo 600 MCG/2.4ML Sopn Generic drug: Teriparatide (Recombinant) Inject 20 mcg into the skin daily.   gabapentin 300 MG capsule Commonly known as: NEURONTIN Take 300 mg by mouth 3 (three) times daily.   HYDROcodone-acetaminophen 5-325 MG tablet Commonly known as: NORCO/VICODIN Take 1 tablet by mouth every 4 (four) hours as needed for up to 5 days for moderate pain. What changed: when to take this   meclizine 25 MG tablet Commonly known as: ANTIVERT Take 1 tablet (25 mg total) by mouth 3 (three) times daily as needed  for dizziness.   melatonin 5 MG Tabs Take 5 mg by mouth at bedtime.   methocarbamol 500 MG tablet Commonly known as: ROBAXIN TAKE 1 TABLET BY MOUTH EVERY DAY AS NEEDED What changed:  when to take this reasons to take this   metoprolol tartrate 50 MG tablet Commonly known as: LOPRESSOR TAKE 1 TABLET BY MOUTH TWICE A DAY What changed: when to take this  mupirocin ointment 2 % Commonly known as: BACTROBAN Apply 1 application  topically 2 (two) times daily. What changed:  when to take this reasons to take this   nortriptyline 50 MG capsule Commonly known as: PAMELOR Take 100 mg by mouth daily at 6 PM.   ondansetron 8 MG tablet Commonly known as: ZOFRAN Take 8 mg by mouth every 8 (eight) hours as needed for nausea.   OXcarbazepine 150 MG tablet Commonly known as: TRILEPTAL Take 150 mg by mouth in the morning and at bedtime.   pantoprazole 40 MG tablet Commonly known as: PROTONIX Take 1 tablet (40 mg total) by mouth daily. What changed: when to take this   PRESCRIPTION MEDICATION CPAP- At bedtime   PROBIOTIC ADVANCED PO Take 1 capsule by mouth at bedtime.   promethazine 25 MG tablet Commonly known as: PHENERGAN Take 25 mg by mouth every 6 (six) hours as needed for nausea or vomiting.   Restasis 0.05 % ophthalmic emulsion Generic drug: cycloSPORINE Place 1 drop into both eyes in the morning and at bedtime.   Systane Ultra PF 0.4-0.3 % Soln Generic drug: Polyethyl Glyc-Propyl Glyc PF Place 1 drop into both eyes 3 (three) times daily as needed (for dryness).               Discharge Care Instructions  (From admission, onward)           Start     Ordered   08/08/22 0000  Change dressing on IV access line weekly and PRN  (Home infusion instructions - [facility] )        08/08/22 1245   08/08/22 0000  Discharge wound care:       Comments: Continue cast in place. Follow up with orthopedic surgery 1 week after discharge   08/08/22 1331             Follow-up Information     [facility] Follow up.   Why: agency will provide home IV antibiotics.               Allergies  Allergen Reactions   Erythromycin Nausea Only and Other (See Comments)    Abdominal pain, also   Meperidine Hcl Other (See Comments)    Made the patient feel hot and her heart raced- made her feel "flushed," also   Polyethyl Glyc-Propyl Glyc Pf Nausea And Vomiting and Other (See Comments)    RN confirmed with patient 9/4 (Miralax)   Dilaudid [Hydromorphone Hcl] Itching    Consultations: Podiatry ID  Procedures: Procedures: 08/04/22             1) removal implant deep right great toe             2) bone biopsy open deep right great toe             3) irrigation and lavage of surgical site   Discharge Exam: BP 135/62 (BP Location: Right Arm)   Pulse 68   Temp 98 F (36.7 C)   Resp 18   Ht 5' (1.524 m)   Wt 85.3 kg   LMP 07/27/2006   SpO2 100%   BMI 36.72 kg/m  Physical Exam Constitutional:      Appearance: Normal appearance.  HENT:     Head: Normocephalic and atraumatic.     Mouth/Throat:     Mouth: Mucous membranes are moist.  Eyes:     Extraocular Movements: Extraocular movements intact.  Cardiovascular:     Rate and Rhythm: Normal  rate and regular rhythm.  Pulmonary:     Effort: Pulmonary effort is normal.     Breath sounds: Normal breath sounds.  Abdominal:     General: Bowel sounds are normal. There is no distension.     Palpations: Abdomen is soft.     Tenderness: There is no abdominal tenderness.  Musculoskeletal:     Cervical back: Normal range of motion and neck supple.     Comments: RLE wrapped in cast with boot in place  Skin:    General: Skin is warm and dry.  Neurological:     General: No focal deficit present.     Mental Status: She is alert.  Psychiatric:        Mood and Affect: Mood normal.        Behavior: Behavior normal.      The results of significant diagnostics from this hospitalization  (including imaging, microbiology, ancillary and laboratory) are listed below for reference.   Microbiology: Recent Results (from the past 240 hour(s))  Culture, blood (Routine X 2) w Reflex to ID Panel     Status: None   Collection Time: 08/01/22  9:44 AM   Specimen: BLOOD LEFT ARM  Result Value Ref Range Status   Specimen Description   Final    BLOOD LEFT ARM Performed at [facility] [address] [place], [place] [address]    Special Requests   Final    BOTTLES DRAWN AEROBIC ONLY Blood Culture adequate volume Performed at [facility] [address] [place], [place] [address]    Culture   Final    NO GROWTH 5 DAYS Performed at [facility], [place] [address] [place], [place] [address]    Report Status 08/06/2022 FINAL  Final  Culture, blood (Routine X 2) w Reflex to ID Panel     Status: None   Collection Time: 08/01/22 10:28 AM   Specimen: BLOOD  Result Value Ref Range Status   Specimen Description   Final    BLOOD BLOOD LEFT HAND Performed at [facility] [address], [place], [place] [address]    Special Requests   Final    Blood Culture adequate volume AEROBIC BOTTLE ONLY Performed at [facility] [address] [place], [place] [address]    Culture   Final    NO GROWTH 5 DAYS Performed at [facility], [place] [address], [place], [place] [address]    Report Status 08/06/2022 FINAL  Final  Surgical pcr screen     Status: None   Collection Time: 08/03/22  2:09 PM   Specimen: Nasal Mucosa; Nasal Swab  Result Value Ref Range Status   MRSA, PCR NEGATIVE NEGATIVE Final   Staphylococcus aureus NEGATIVE NEGATIVE Final    Comment: (NOTE) The Xpert SA Assay (FDA approved for NASAL specimens in patients 52 years of age and older), is one component of a comprehensive surveillance program. It is not intended to diagnose infection nor to guide or monitor treatment. Performed at  [facility], [place] [employer] [address] [place], [place] [address]   Aerobic/Anaerobic Culture w Gram Stain (surgical/deep wound)     Status: None   Collection Time: 08/03/22  5:59 PM   Specimen: Wound; Body Fluid  Result Value Ref Range Status   Specimen Description   Final    WOUND Performed at [facility] [address] [place], [place] [address]    Special Requests  Final    RIGHT GREAT BIG TOE JOINT FLUID Performed at [facility] [address] [place], [place] [address]    Gram Stain NO WBC SEEN RARE GRAM POSITIVE COCCI   Final   Culture   Final    RARE STAPHYLOCOCCUS EPIDERMIDIS NO ANAEROBES ISOLATED Performed at [facility], [place] [address], [place], [place] [address]    Report Status 08/08/2022 FINAL  Final   Organism ID, Bacteria STAPHYLOCOCCUS EPIDERMIDIS  Final      Susceptibility   Staphylococcus epidermidis - MIC*    CIPROFLOXACIN 4 RESISTANT Resistant     ERYTHROMYCIN <=0.25 SENSITIVE Sensitive     GENTAMICIN 8 INTERMEDIATE Intermediate     OXACILLIN >=4 RESISTANT Resistant     TETRACYCLINE >=16 RESISTANT Resistant     VANCOMYCIN 2 SENSITIVE Sensitive     TRIMETH/SULFA 80 RESISTANT Resistant     CLINDAMYCIN <=0.25 SENSITIVE Sensitive     RIFAMPIN <=0.5 SENSITIVE Sensitive     Inducible Clindamycin NEGATIVE Sensitive     * RARE STAPHYLOCOCCUS EPIDERMIDIS  Aerobic/Anaerobic Culture w Gram Stain (surgical/deep wound)     Status: None   Collection Time: 08/03/22  6:07 PM   Specimen: Wound; Body Fluid  Result Value Ref Range Status   Specimen Description   Final    WOUND Performed at [facility] [address], [place], [place] [address]    Special Requests   Final    HARDWARE RIGHT GREAT BIG TOE Performed at [facility] [address] [place], [place] [address]    Gram Stain NO ORGANISMS SEEN NO WBC SEEN   Final   Culture   Final    RARE  STAPHYLOCOCCUS EPIDERMIDIS NO ANAEROBES ISOLATED Performed at [facility], [place] [address], [place], [place] [address]    Report Status 08/08/2022 FINAL  Final   Organism ID, Bacteria STAPHYLOCOCCUS EPIDERMIDIS  Final      Susceptibility   Staphylococcus epidermidis - MIC*    CIPROFLOXACIN 4 RESISTANT Resistant     ERYTHROMYCIN <=0.25 SENSITIVE Sensitive     GENTAMICIN 8 INTERMEDIATE Intermediate     OXACILLIN >=4 RESISTANT Resistant     TETRACYCLINE >=16 RESISTANT Resistant     VANCOMYCIN 2 SENSITIVE Sensitive     TRIMETH/SULFA 80 RESISTANT Resistant     CLINDAMYCIN <=0.25 SENSITIVE Sensitive     RIFAMPIN <=0.5 SENSITIVE Sensitive     Inducible Clindamycin NEGATIVE Sensitive     * RARE STAPHYLOCOCCUS EPIDERMIDIS  Aerobic/Anaerobic Culture w Gram Stain (surgical/deep wound)     Status: None   Collection Time: 08/03/22  6:15 PM   Specimen: Wound  Result Value Ref Range Status   Specimen Description   Final    WOUND Performed at [facility] [address], [place], [place] [address]    Special Requests   Final    NONE DISTAL PHALANX BONE Performed at [facility] [address] [place], [place] [address]    Gram Stain NO ORGANISMS SEEN NO WBC SEEN   Final   Culture   Final    No growth aerobically or anaerobically. Performed at [facility], [place] [address], [place], [place] [address]    Report Status 08/08/2022 FINAL  Final  Aerobic/Anaerobic Culture w Gram Stain (surgical/deep wound)     Status: None   Collection Time: 08/03/22  6:20 PM   Specimen: Wound  Result Value Ref Range Status   Specimen Description  Final    WOUND Performed at [facility], [place] [address], [place], [place] [address]    Special Requests   Final    NONE PROXIMAL PHALANX BONE Performed at [facility], [place] [address] [place], [place] [address]    Gram Stain NO ORGANISMS SEEN NO WBC SEEN   Final    Culture   Final    RARE STAPHYLOCOCCUS EPIDERMIDIS SUSCEPTIBILITIES PERFORMED ON PREVIOUS CULTURE WITHIN THE LAST 5 DAYS. NO ANAEROBES ISOLATED Performed at [facility], [place] [address] [place], [place] [address]    Report Status 08/08/2022 FINAL  Final     Labs: BNP (last 3 results) No results for input(s): "BNP" in the last 8760 hours. Basic Metabolic Panel: Recent Labs  Lab 08/02/22 0341 08/03/22 0436 08/04/22 0313 08/05/22 0326  NA 142 141 143 143  K 3.4* 3.6 4.0 4.0  CL 108 110 108 109  CO2 _0 GLUCOSE 101* 94 85 101*  BUN _1 CREATININE 0.80 0.69 0.68 0.75  CALCIUM 9.0 8.4* 8.8* 8.8*  MG 2.1  --   --   --    Liver Function Tests: No results for input(s): "AST", "ALT", "ALKPHOS", "BILITOT", "PROT", "ALBUMIN" in the last 168 hours. No results for input(s): "LIPASE", "AMYLASE" in the last 168 hours. No results for input(s): "AMMONIA" in the last 168 hours. CBC: Recent Labs  Lab 08/02/22 0341  WBC 9.4  NEUTROABS 5.5  HGB 12.3  HCT 38.4  MCV 93.0  PLT 272   Cardiac Enzymes: Recent Labs  Lab 08/04/22 1624 08/05/22 0326  CKTOTAL 57 45   BNP: Invalid input(s): "POCBNP" CBG: No results for input(s): "GLUCAP" in the last 168 hours. D-Dimer No results for input(s): "DDIMER" in the last 72 hours. Hgb A1c No results for input(s): "HGBA1C" in the last 72 hours. Lipid Profile No results for input(s): "CHOL", "HDL", "LDLCALC", "TRIG", "CHOLHDL", "LDLDIRECT" in the last 72 hours. Thyroid function studies No results for input(s): "TSH", "T4TOTAL", "T3FREE", "THYROIDAB" in the last 72 hours.  Invalid input(s): "FREET3" Anemia work up No results for input(s): "VITAMINB12", "FOLATE", "FERRITIN", "TIBC", "IRON", "RETICCTPCT" in the last 72 hours. Urinalysis    Component Value Date/Time   COLORURINE YELLOW 01/22/2021 0329   APPEARANCEUR CLEAR 01/22/2021 0329   LABSPEC 1.010 06/28/2021 1005   PHURINE 6.0 01/22/2021 0329   GLUCOSEU  NEGATIVE 01/22/2021 0329   HGBUR NEGATIVE 01/22/2021 0329   BILIRUBINUR negative 06/28/2021 1005   BILIRUBINUR 1 04/23/2017 0913   BILIRUBINUR Negative 05/02/2011 1412   KETONESUR negative 06/28/2021 1005   KETONESUR NEGATIVE 01/22/2021 0329   PROTEINUR negative 06/28/2021 1005   PROTEINUR NEGATIVE 01/22/2021 0329   UROBILINOGEN negative (A) 04/23/2017 0913   UROBILINOGEN 0.2 08/25/2015 1045   NITRITE Negative 06/28/2021 1005   NITRITE NEGATIVE 01/22/2021 0329   LEUKOCYTESUR Negative 06/28/2021 1005   LEUKOCYTESUR TRACE (A) 01/22/2021 0329   LEUKOCYTESUR Moderate 05/02/2011 1412   Sepsis Labs Recent Labs  Lab 08/02/22 0341  WBC 9.4   Microbiology Recent Results (from the past 240 hour(s))  Culture, blood (Routine X 2) w Reflex to ID Panel     Status: None   Collection Time: 08/01/22  9:44 AM   Specimen: BLOOD LEFT ARM  Result Value Ref Range Status   Specimen Description   Final    BLOOD LEFT ARM Performed at [facility] [address], [place], [place] [address]    Special Requests  Final    BOTTLES DRAWN AEROBIC ONLY Blood Culture adequate volume Performed at [facility] [address] [place], [place] [address]    Culture   Final    NO GROWTH 5 DAYS Performed at [facility], [place] [address] [place], [place] [address]    Report Status 08/06/2022 FINAL  Final  Culture, blood (Routine X 2) w Reflex to ID Panel     Status: None   Collection Time: 08/01/22 10:28 AM   Specimen: BLOOD  Result Value Ref Range Status   Specimen Description   Final    BLOOD BLOOD LEFT HAND Performed at [facility] [address] [place], [place] [address]    Special Requests   Final    Blood Culture adequate volume AEROBIC BOTTLE ONLY Performed at [facility] [address], [place], [place] [address]    Culture   Final    NO GROWTH 5 DAYS Performed at [facility], [place] [address] [place], [place] [address]    Report Status 08/06/2022 FINAL  Final  Surgical pcr screen     Status: None   Collection Time: 08/03/22  2:09 PM   Specimen: Nasal Mucosa; Nasal Swab  Result Value Ref Range Status   MRSA, PCR NEGATIVE NEGATIVE Final   Staphylococcus aureus NEGATIVE NEGATIVE Final    Comment: (NOTE) The Xpert SA Assay (FDA approved for NASAL specimens in patients 37 years of age and older), is one component of a comprehensive surveillance program. It is not intended to diagnose infection nor to guide or monitor treatment. Performed at [facility], [place] [address], [place], [place] [address]   Aerobic/Anaerobic Culture w Gram Stain (surgical/deep wound)     Status: None   Collection Time: 08/03/22  5:59 PM   Specimen: Wound; Body Fluid  Result Value Ref Range Status   Specimen Description   Final    WOUND Performed at [facility] [address] [place], [place] [address]    Special Requests   Final    RIGHT GREAT BIG TOE JOINT FLUID Performed at [facility] [address] [place], [place] [address]    Gram Stain NO WBC SEEN RARE GRAM POSITIVE COCCI   Final   Culture   Final    RARE STAPHYLOCOCCUS EPIDERMIDIS NO ANAEROBES ISOLATED Performed at [facility], [place] [address] [place], [place] [address]    Report Status 08/08/2022 FINAL  Final   Organism ID, Bacteria STAPHYLOCOCCUS EPIDERMIDIS  Final      Susceptibility   Staphylococcus epidermidis - MIC*    CIPROFLOXACIN 4 RESISTANT Resistant     ERYTHROMYCIN <=0.25 SENSITIVE Sensitive     GENTAMICIN 8 INTERMEDIATE Intermediate     OXACILLIN >=4 RESISTANT Resistant     TETRACYCLINE >=16 RESISTANT Resistant     VANCOMYCIN 2 SENSITIVE Sensitive     TRIMETH/SULFA 80 RESISTANT Resistant     CLINDAMYCIN <=0.25 SENSITIVE Sensitive     RIFAMPIN <=0.5 SENSITIVE Sensitive     Inducible Clindamycin NEGATIVE Sensitive     * RARE STAPHYLOCOCCUS  EPIDERMIDIS  Aerobic/Anaerobic Culture w Gram Stain (surgical/deep wound)     Status: None   Collection Time: 08/03/22  6:07 PM   Specimen: Wound; Body Fluid  Result Value Ref Range Status   Specimen Description   Final    WOUND Performed at [facility] [address], [place], [place] [address]  Special Requests   Final    HARDWARE RIGHT GREAT BIG TOE Performed at [facility] [address] [place], [place] [address]    Gram Stain NO ORGANISMS SEEN NO WBC SEEN   Final   Culture   Final    RARE STAPHYLOCOCCUS EPIDERMIDIS NO ANAEROBES ISOLATED Performed at [facility], [place] [address] [place], [place] [address]    Report Status 08/08/2022 FINAL  Final   Organism ID, Bacteria STAPHYLOCOCCUS EPIDERMIDIS  Final      Susceptibility   Staphylococcus epidermidis - MIC*    CIPROFLOXACIN 4 RESISTANT Resistant     ERYTHROMYCIN <=0.25 SENSITIVE Sensitive     GENTAMICIN 8 INTERMEDIATE Intermediate     OXACILLIN >=4 RESISTANT Resistant     TETRACYCLINE >=16 RESISTANT Resistant     VANCOMYCIN 2 SENSITIVE Sensitive     TRIMETH/SULFA 80 RESISTANT Resistant     CLINDAMYCIN <=0.25 SENSITIVE Sensitive     RIFAMPIN <=0.5 SENSITIVE Sensitive     Inducible Clindamycin NEGATIVE Sensitive     * RARE STAPHYLOCOCCUS EPIDERMIDIS  Aerobic/Anaerobic Culture w Gram Stain (surgical/deep wound)     Status: None   Collection Time: 08/03/22  6:15 PM   Specimen: Wound  Result Value Ref Range Status   Specimen Description   Final    WOUND Performed at [facility] [address] [place], [place] [address]    Special Requests   Final    NONE DISTAL PHALANX BONE Performed at [facility] [address] [place], [place] [address]    Gram Stain NO ORGANISMS SEEN NO WBC SEEN   Final   Culture   Final    No growth aerobically or anaerobically. Performed at [facility], [place] [address], [place], [place]  [address]    Report Status 08/08/2022 FINAL  Final  Aerobic/Anaerobic Culture w Gram Stain (surgical/deep wound)     Status: None   Collection Time: 08/03/22  6:20 PM   Specimen: Wound  Result Value Ref Range Status   Specimen Description   Final    WOUND Performed at [facility] [address] [place], [place] [address]    Special Requests   Final    NONE PROXIMAL PHALANX BONE Performed at [facility], [place] [address], [place], [place] [address]    Gram Stain NO ORGANISMS SEEN NO WBC SEEN   Final   Culture   Final    RARE STAPHYLOCOCCUS EPIDERMIDIS SUSCEPTIBILITIES PERFORMED ON PREVIOUS CULTURE WITHIN THE LAST 5 DAYS. NO ANAEROBES ISOLATED Performed at [facility], [place] [address], [place], [place] [address]    Report Status 08/08/2022 FINAL  Final    Procedures/Studies: [place] EKG SITE RITE  Result Date: 08/08/2022 If Site Rite image not attached, placement could not be confirmed due to current cardiac rhythm.  NM WBC SCAN TUMOR LOC LIMITED  Result Date: 08/03/2022 CLINICAL DATA:  Suspicion of osteomyelitis. EXAM: NUCLEAR MEDICINE LEUKOCYTE SCAN TECHNIQUE: Following intravenous administration of radiolabeled white blood cells, images of the head, neck, trunk, and extremities were obtained on subsequent days. RADIOPHARMACEUTICALS:  30.6 technetium 99 Ceretec labeled autologous leukocytes IV COMPARISON:  Radiograph 07/30/2022 FINDINGS: There is hyperemia to the RIGHT foot on the early arterial phase imaging. Delayed imaging there is focal accumulation of tagged white blood cells in the first ray at the level of the proximal phalanx. IMPRESSION: Hyperemia to the RIGHT foot and focal accumulation of tagged white blood  cells in the proximal phalanx of the first RIGHT digit suggests infectious/inflammatory process including osteomyelitis. Electronically Signed   By: Suzy Bouchard M.D.   On: 08/03/2022 08:33   VAS [place] ABI WITH/WO TBI  Result  Date: 08/01/2022  LOWER EXTREMITY DOPPLER STUDY Patient Name:  GLENDINE SWETZ  Date of Exam:   08/01/2022 Medical Rec #: [id number]      Accession #:    [id number] Date of Birth: 1955-12-26       Patient Gender: F Patient Age:   66 years Exam Location:  [facility] Procedure:      VAS [place] ABI WITH/WO TBI Referring Phys: Celesta Gentile --------------------------------------------------------------------------------  Indications: Cellulitis right foot (great toe) High Risk Factors: Hyperlipidemia.  Comparison Study: No previous exams Performing Technologist: Hill, Jody RVT, RDMS  Examination Guidelines: A complete evaluation includes at minimum, Doppler waveform signals and systolic blood pressure reading at the level of bilateral brachial, anterior tibial, and posterior tibial arteries, when vessel segments are accessible. Bilateral testing is considered an integral part of a complete examination. Photoelectric Plethysmograph (PPG) waveforms and toe systolic pressure readings are included as required and additional duplex testing as needed. Limited examinations for reoccurring indications may be performed as noted.  ABI Findings: +---------+------------------+-----+---------+------------------+ Right    Rt Pressure (mmHg)IndexWaveform Comment            +---------+------------------+-----+---------+------------------+ Brachial 132                    triphasic                   +---------+------------------+-----+---------+------------------+ PTA      154               1.12 triphasic                   +---------+------------------+-----+---------+------------------+ DP       160               1.17 triphasic                   +---------+------------------+-----+---------+------------------+ Great Toe                       Normal   too large for cuff +---------+------------------+-----+---------+------------------+ +---------+------------------+-----+---------+-------+ Left     Lt  Pressure (mmHg)IndexWaveform Comment +---------+------------------+-----+---------+-------+ Brachial 137                    triphasic        +---------+------------------+-----+---------+-------+ PTA      143               1.04 triphasic        +---------+------------------+-----+---------+-------+ DP       143               1.04 triphasic        +---------+------------------+-----+---------+-------+ [place]               1.00 Normal           +---------+------------------+-----+---------+-------+ +-------+-----------+-----------+------------+------------+ ABI/TBIToday's ABIToday's TBIPrevious ABIPrevious TBI +-------+-----------+-----------+------------+------------+ Right  1.17                                           +-------+-----------+-----------+------------+------------+ Left   1.04       1.00                                +-------+-----------+-----------+------------+------------+  Unable to obtain TBI on right side due to size of toe.  Summary: Right: Resting right ankle-brachial index is within normal range. Left: Resting left ankle-brachial index is within normal range. The left toe-brachial index is normal. *See table(s) above for measurements and observations.  Electronically signed by Harold Barban MD on 08/01/2022 at 10:30:09 PM.    Final    DG Foot Complete Right  Result Date: 07/30/2022 CLINICAL DATA:  Big toe swelling. Surgery proximally 1 month ago. Progressive pain, redness and swelling EXAM: RIGHT FOOT COMPLETE - 3+ VIEW COMPARISON:  Radiograph 07/17/2022 FINDINGS: Two screws traverse the first metatarsal phalangeal joint. There is progressive lysis and erosive changes involving the great toe interphalangeal joint, including increasing lucency adjacent to the screws. Loss of normal joint space with increasing lucency and loss of normal articular borders. There is some bony fragmentation. There is progressive soft tissue edema about the  dorsum of the foot. Examination is otherwise unchanged. IMPRESSION: Increasing lucency and erosive changes about the great toe interphalangeal joint, including increasing lucency adjacent to the surgical screws. Findings suspicious for osteomyelitis/septic arthritis. Worsening radiographic findings since August 22nd exam. Electronically Signed   By: Keith Rake M.D.   On: 07/30/2022 15:27   DG Foot Complete Right  Result Date: 07/17/2022 Please see detailed radiograph report in office note.    Time coordinating discharge: Over 30 minutes    Dwyane Dee, MD  [facility] 08/08/2022, 6:23 PM

## 2022-08-08 NOTE — Progress Notes (Signed)
PHARMACY CONSULT NOTE FOR:  OUTPATIENT  PARENTERAL ANTIBIOTIC THERAPY (OPAT)  Indication: R-great toe osteo/septic joint Regimen: Daptomycin 500 mg IV every 24 hours End date: 09/14/22  IV antibiotic discharge orders are pended. To discharging provider:  please sign these orders via discharge navigator,  Select New Orders & click on the button choice - Manage This Unsigned Work.     Thank you for allowing pharmacy to be a part of this patient's care.  Alycia Rossetti, PharmD, BCPS Infectious Diseases Clinical Pharmacist 08/08/2022 8:31 AM   **Pharmacist phone directory can now be found on [email or web address] ([email or web address]).  Listed under [place].

## 2022-08-08 NOTE — TOC Transition Note (Signed)
Transition of Care [facility]) - CM/SW Discharge Note   Patient Details  Name: [name] MRN: [id number] Date of Birth: 02-06-1956  Transition of Care (TOC) CM/SW Contact:  Joaquin Courts, RN Phone Number: 08/08/2022, 1:03 PM   Clinical Narrative:    Patient is set up for home IV antibiotics with [facility], [facility] to provide [facility] services.  Carolynn Sayers to arrive at bedside for hands on teaching.  No further TOC needs identified.    Final next level of care: Home w Home Health Services Barriers to Discharge: Other (must enter comment) (pending picc placement)   Patient Goals and CMS Choice Patient states their goals for this hospitalization and ongoing recovery are:: to go home CMS Medicare.gov Compare Post Acute Care list provided to:: Patient Choice offered to / list presented to : Patient  Discharge Placement                       Discharge Plan and Services     Post Acute Care Choice: Home Health                    HH Arranged: IV Antibiotics HH Agency: [facility] Date HH Agency Contacted: 08/08/22 Time HH Agency Contacted: 1300 Representative spoke with at [place]: [place]) Interventions     Readmission Risk Interventions     No data to display

## 2022-08-08 NOTE — Plan of Care (Signed)
Plan of care reviewed and discussed. °

## 2022-08-08 NOTE — Progress Notes (Signed)
Peripherally Inserted Central Catheter Placement  The IV Nurse has discussed with the patient and/or persons authorized to consent for the patient, the purpose of this procedure and the potential benefits and risks involved with this procedure.  The benefits include less needle sticks, lab draws from the catheter, and the patient may be discharged home with the catheter. Risks include, but not limited to, infection, bleeding, blood clot (thrombus formation), and puncture of an artery; nerve damage and irregular heartbeat and possibility to perform a PICC exchange if needed/ordered by physician.  Alternatives to this procedure were also discussed.  Bard Power PICC patient education guide, fact sheet on infection prevention and patient information card has been provided to patient /or left at bedside.  PICC inserted by Valentina Shaggy, RN   PICC Placement Documentation  PICC Single Lumen 28/78/67 Right Basilic 35 cm 0 cm (Active)  Indication for Insertion or Continuance of Line Home intravenous therapies (PICC only) 08/08/22 1507  Exposed Catheter (cm) 0 cm 08/08/22 1507  Site Assessment Clean;Dry;Intact 08/08/22 1507  Line Status Flushed;Saline locked;Blood return noted 08/08/22 1507  Dressing Type Transparent;Securing device 08/08/22 1507  Dressing Status Antimicrobial disc in place;Clean, Dry, Intact 08/08/22 1507  Safety Lock Not Applicable 67/20/94 7096  Line Care Connections checked and tightened 08/08/22 1507  Line Adjustment (NICU/IV Team Only) No 08/08/22 1507  Dressing Intervention New dressing 08/08/22 1507  Dressing Change Due 08/15/22 08/08/22 [place], Nicolette Bang 08/08/2022, 3:08 PM

## 2022-08-09 ENCOUNTER — Telehealth: Payer: Self-pay

## 2022-08-09 ENCOUNTER — Encounter: Payer: Medicare Other | Admitting: Podiatry

## 2022-08-09 ENCOUNTER — Telehealth: Payer: Self-pay | Admitting: Podiatry

## 2022-08-09 DIAGNOSIS — M869 Osteomyelitis, unspecified: Secondary | ICD-10-CM | POA: Diagnosis not present

## 2022-08-09 NOTE — Telephone Encounter (Signed)
Pt called stating she was released from the hospital yesterday and needs appt for next week per Dr Joretta Bachelor to have cast change. I have scheduled her for appts for weekly appts for the next 5 weeks after next week. Where can I add pt next week to nurse schedule?

## 2022-08-09 NOTE — Telephone Encounter (Signed)
Transition Care Management Follow-up Telephone Call Date of discharge and from where: [name] 08/08/22 How have you been since you were released from the hospital? [facility] a little tired  Any questions or concerns? No pt. Stated they told her to hold on her Atorvastatin will on this home IV anti biotic which she is supposed to be on for 1 month.   Items Reviewed: Did the pt receive and understand the discharge instructions provided? Yes  Medications obtained and verified? Yes  Other? No  Any new allergies since your discharge? No  Dietary orders reviewed? Yes Do you have support at home? Yes   Home Care and Equipment/Supplies: Were home health services ordered? yes If so, what is the name of the agency? [facility]  Has the agency set up a time to come to the patient's home? Yes 08/09/22 at 10a.m.  Follow up appointments reviewed:  PCP Hospital f/u appt confirmed? No  pt. Does not need f/u with PCP  [facility] f/u appt confirmed? No  pt. Is to f/u with home health  Are transportation arrangements needed? No  If their condition worsens, is the pt aware to call PCP or go to the Emergency Dept.? Yes Was the patient provided with contact information for the PCP's office or ED? Yes Was to pt encouraged to call back with questions or concerns? Yes

## 2022-08-10 ENCOUNTER — Encounter: Payer: Self-pay | Admitting: Infectious Diseases

## 2022-08-10 DIAGNOSIS — M869 Osteomyelitis, unspecified: Secondary | ICD-10-CM | POA: Diagnosis not present

## 2022-08-10 NOTE — Progress Notes (Signed)
     Component 7 d ago  Specimen Description WOUND Performed at [facility], [place] [address], [place], [place] [address]  Special Requests RIGHT GREAT BIG TOE JOINT FLUID Performed at [facility], [place] [address] [place], [place] [address]  Gram Stain NO WBC SEEN RARE GRAM POSITIVE COCCI  Culture RARE STAPHYLOCOCCUS EPIDERMIDIS NO ANAEROBES ISOLATED Performed at [facility], [place] [address] [place], [place] [address]  Report Status 08/08/2022 FINAL  Organism ID, Bacteria STAPHYLOCOCCUS EPIDERMIDIS  Resulting Agency [facility]     Susceptibility   Staphylococcus epidermidis    MIC    CIPROFLOXACIN 4 RESISTANT Resistant    CLINDAMYCIN <=0.25 SENS... Sensitive    ERYTHROMYCIN <=0.25 SENS... Sensitive    GENTAMICIN 8 INTERMEDI... Intermediate    Inducible Clindamycin NEGATIVE Sensitive    OXACILLIN >=4 RESISTANT Resistant    RIFAMPIN <=0.5 SENSI... Sensitive    TETRACYCLINE >=16 RESIST... Resistant    TRIMETH/SULFA 80 RESISTANT Resistant    VANCOMYCIN 2 SENSITIVE Sensitive           Susceptibility Comments  Staphylococcus epidermidis  RARE STAPHYLOCOCCUS EPIDERMIDIS

## 2022-08-13 ENCOUNTER — Ambulatory Visit (INDEPENDENT_AMBULATORY_CARE_PROVIDER_SITE_OTHER): Payer: Medicare Other

## 2022-08-13 DIAGNOSIS — M86171 Other acute osteomyelitis, right ankle and foot: Secondary | ICD-10-CM | POA: Diagnosis not present

## 2022-08-13 DIAGNOSIS — M109 Gout, unspecified: Secondary | ICD-10-CM | POA: Diagnosis not present

## 2022-08-13 DIAGNOSIS — M869 Osteomyelitis, unspecified: Secondary | ICD-10-CM | POA: Diagnosis not present

## 2022-08-13 LAB — MINIMUM INHIBITORY CONC. (1 DRUG): Source CRE: 96388

## 2022-08-13 LAB — AEROBIC/ANAEROBIC CULTURE W GRAM STAIN (SURGICAL/DEEP WOUND): Gram Stain: NONE SEEN

## 2022-08-13 LAB — MIC RESULT

## 2022-08-13 NOTE — Progress Notes (Signed)
Patient in office today for total contact case removal and application.  Total contact case removed without complication. Patient denies nausea, vomiting, fever, and chills. Betadine ointment was applied to right hallux followed by sterile gauze. Total contact case applied and dried in office.   Advised patient to call the office with any concerns or questions. Patient verbalized understanding.

## 2022-08-16 ENCOUNTER — Ambulatory Visit: Payer: Medicare Other | Admitting: Internal Medicine

## 2022-08-16 DIAGNOSIS — M869 Osteomyelitis, unspecified: Secondary | ICD-10-CM | POA: Diagnosis not present

## 2022-08-20 DIAGNOSIS — M869 Osteomyelitis, unspecified: Secondary | ICD-10-CM | POA: Diagnosis not present

## 2022-08-21 ENCOUNTER — Ambulatory Visit (INDEPENDENT_AMBULATORY_CARE_PROVIDER_SITE_OTHER): Payer: Medicare Other

## 2022-08-21 ENCOUNTER — Ambulatory Visit (INDEPENDENT_AMBULATORY_CARE_PROVIDER_SITE_OTHER): Payer: Medicare Other | Admitting: Podiatry

## 2022-08-21 DIAGNOSIS — M14671 Charcot's joint, right ankle and foot: Secondary | ICD-10-CM

## 2022-08-21 DIAGNOSIS — T8142XA Infection following a procedure, deep incisional surgical site, initial encounter: Secondary | ICD-10-CM | POA: Diagnosis not present

## 2022-08-21 DIAGNOSIS — M2041 Other hammer toe(s) (acquired), right foot: Secondary | ICD-10-CM

## 2022-08-23 ENCOUNTER — Other Ambulatory Visit: Payer: Self-pay | Admitting: Adult Health

## 2022-08-23 ENCOUNTER — Ambulatory Visit: Payer: Medicare Other | Admitting: Infectious Diseases

## 2022-08-23 DIAGNOSIS — M869 Osteomyelitis, unspecified: Secondary | ICD-10-CM | POA: Diagnosis not present

## 2022-08-23 DIAGNOSIS — G4700 Insomnia, unspecified: Secondary | ICD-10-CM

## 2022-08-23 NOTE — Telephone Encounter (Signed)
As far back as 2021 you are the only one who is prescribing alprazolam. She is just on a lot of other controlled medications from other providers. I copy the info from the PMP database so that you can see what else patient is getting.

## 2022-08-23 NOTE — Telephone Encounter (Signed)
Was this ordered by someone else?

## 2022-08-23 NOTE — Progress Notes (Signed)
Subjective:  Patient ID: [name], female    DOB: [birth year],  MRN: [id number]  Chief Complaint  Patient presents with   Routine Post Op     POV #4 DOS 06/29/2022 FUSION OF JOINT IN BIG TOE RT FOOT. PER [name]    DOS: 08/03/2022 Procedure: Hardware removal and bone biopsies  66 y.o. female returns for post-op check.  She is doing well.  She is on her IV antibiotics via PICC line.  Cast was changed last week.  Some spots along the heel that were causing pressure  Review of Systems: Negative except as noted in the HPI. Denies N/V/F/Ch.   Objective:  There were no vitals filed for this visit. There is no height or weight on file to calculate BMI. Constitutional Well developed. Well nourished.  Vascular Foot warm and well perfused. Capillary refill normal to all digits.  Calf is soft and supple, no posterior calf or knee pain, negative Homans' sign  Neurologic Normal speech. Oriented to person, place, and time. Epicritic sensation to light touch grossly reduced bilaterally.  Dermatologic Toe incision is well-healed.  There is hyperkeratosis on the medial heel.  No ulceration  Orthopedic: Moderate edema and mild tenderness to palpation noted about the surgical site.   Multiple view plain film radiographs: Interval removal of the hardware increasing destruction of the IPJ of the hallux  Blood culture with staph epidermidis, bone biopsy was negative Assessment:   1. Charcot's joint of right foot    Plan:  Patient was evaluated and treated and all questions answered.  S/p foot surgery right -She will continue her IV antibiotics -XR: X-rays reviewed with patient -WB Status: WBAT in total contact cast with walking boot -Sutures: Removed today. -Povidone ointment applied to incision.  Well-padded below-knee rigid total contact cast was reapplied  Return in about 9 days (around 08/30/2022) for total contact casting.

## 2022-08-24 NOTE — Telephone Encounter (Signed)
Noted. Ty!

## 2022-08-26 DIAGNOSIS — M869 Osteomyelitis, unspecified: Secondary | ICD-10-CM | POA: Diagnosis not present

## 2022-08-27 DIAGNOSIS — M869 Osteomyelitis, unspecified: Secondary | ICD-10-CM | POA: Diagnosis not present

## 2022-08-28 ENCOUNTER — Telehealth: Payer: Self-pay | Admitting: *Deleted

## 2022-08-28 DIAGNOSIS — R208 Other disturbances of skin sensation: Secondary | ICD-10-CM | POA: Diagnosis not present

## 2022-08-28 DIAGNOSIS — R519 Headache, unspecified: Secondary | ICD-10-CM | POA: Diagnosis not present

## 2022-08-28 DIAGNOSIS — G2581 Restless legs syndrome: Secondary | ICD-10-CM | POA: Diagnosis not present

## 2022-08-28 DIAGNOSIS — R42 Dizziness and giddiness: Secondary | ICD-10-CM | POA: Diagnosis not present

## 2022-08-28 MED ORDER — TRAMADOL HCL 50 MG PO TABS: 50.0000 mg | ORAL_TABLET | Freq: Four times a day (QID) | ORAL | 0 refills | Status: AC | PRN

## 2022-08-28 NOTE — Telephone Encounter (Signed)
Patient is requesting pain medication refill of tramadol,having much pain.  Please advise.

## 2022-08-29 ENCOUNTER — Other Ambulatory Visit: Payer: Self-pay

## 2022-08-29 ENCOUNTER — Ambulatory Visit: Payer: Medicare Other | Admitting: Infectious Diseases

## 2022-08-29 VITALS — BP 135/85 | HR 84 | Temp 98.1°F | Wt 191.0 lb

## 2022-08-29 DIAGNOSIS — M14679 Charcot's joint, unspecified ankle and foot: Secondary | ICD-10-CM

## 2022-08-29 DIAGNOSIS — T847XXD Infection and inflammatory reaction due to other internal orthopedic prosthetic devices, implants and grafts, subsequent encounter: Secondary | ICD-10-CM | POA: Diagnosis not present

## 2022-08-29 DIAGNOSIS — Z452 Encounter for adjustment and management of vascular access device: Secondary | ICD-10-CM | POA: Diagnosis not present

## 2022-08-29 DIAGNOSIS — H5213 Myopia, bilateral: Secondary | ICD-10-CM | POA: Diagnosis not present

## 2022-08-29 DIAGNOSIS — Z5181 Encounter for therapeutic drug level monitoring: Secondary | ICD-10-CM | POA: Diagnosis not present

## 2022-08-29 DIAGNOSIS — H52223 Regular astigmatism, bilateral: Secondary | ICD-10-CM | POA: Diagnosis not present

## 2022-08-29 DIAGNOSIS — M869 Osteomyelitis, unspecified: Secondary | ICD-10-CM

## 2022-08-29 DIAGNOSIS — M86171 Other acute osteomyelitis, right ankle and foot: Secondary | ICD-10-CM | POA: Diagnosis not present

## 2022-08-29 DIAGNOSIS — H2513 Age-related nuclear cataract, bilateral: Secondary | ICD-10-CM | POA: Diagnosis not present

## 2022-08-29 DIAGNOSIS — H353131 Nonexudative age-related macular degeneration, bilateral, early dry stage: Secondary | ICD-10-CM | POA: Diagnosis not present

## 2022-08-29 DIAGNOSIS — H524 Presbyopia: Secondary | ICD-10-CM | POA: Diagnosis not present

## 2022-08-29 DIAGNOSIS — H04123 Dry eye syndrome of bilateral lacrimal glands: Secondary | ICD-10-CM | POA: Diagnosis not present

## 2022-08-29 NOTE — Progress Notes (Signed)
Patient Active Problem List   Diagnosis Date Noted   History of Clostridioides difficile colitis    Infected orthopedic implant ([place])    Charcot's arthropathy of forefoot    Right foot pain 07/31/2022   Osteomyelitis of great toe of right foot ([place]) 07/30/2022   Bipolar disorder ([place]) 07/30/2022   Osteopenia 08/04/2021   Acute right-sided weakness 01/22/2021   Peripheral polyneuropathy 01/22/2021   History of migraine headaches 01/22/2021   Difficulty with speech    Gastric bypass status for obesity 09/29/2019   Aftercare 08/12/2018   Pain in left knee 06/03/2018   Dyspnea 12/26/2017   Restrictive lung disease secondary to obesity 12/26/2017   Atypical chest pain 12/06/2017   Sinus tachycardia 12/06/2017   Chest pain 12/06/2017   DJD (degenerative joint disease), cervical 12/24/2016   Primary osteoarthritis of both hips 12/24/2016   Primary osteoarthritis of both knees 12/24/2016   H/O total knee replacement, right 12/24/2016   Spondylosis of lumbar region without myelopathy or radiculopathy 12/24/2016   Lumbosacral spondylosis without myelopathy 12/24/2016   Acute gout 05/30/2016   Gout 05/30/2016   Myalgia 03/29/2016   Other long term (current) drug therapy 03/29/2016   Idiopathic peripheral neuropathy 03/29/2016   Cannot sleep 03/29/2016   Migraine without aura and responsive to treatment 03/29/2016   Multifocal myoclonus 03/29/2016   Restless leg 03/29/2016   Has a tremor 03/29/2016   History of aspiration pneumonitis 01/25/2016   History of acute bronchitis 01/25/2016   LPRD (laryngopharyngeal reflux disease) 01/25/2016   Imbalance 01/09/2016   Serotonin syndrome 12/22/2015   Essential hypertension 09/10/2015   OA (osteoarthritis) of knee 09/05/2015   Obesity 05/17/2015   OSA (obstructive sleep apnea) 03/16/2013   Iron deficiency 11/06/2012   Thrombocythemia 11/06/2012   Leukocytosis 11/05/2012   Impaired fasting glucose 07/23/2012   Bronchitis  04/03/2012   Kidney stone on left side 03/05/2012   Kidney cysts 03/04/2012   Loose stools 03/04/2012   Urinary frequency 12/04/2011   Restless leg syndrome 12/04/2011   Polypharmacy 12/04/2011   Bladder incontinence 12/04/2011   Fibromyalgia    Mixed hyperlipidemia    Edema    S/P endometrial ablation    Allergic rhinitis 09/18/2011   Depression, major, single episode, in partial remission ([place]) 04/21/2011   PRECORDIAL PAIN 02/17/2010   Anxiety state 01/30/2010   Migraine 01/30/2010   GERD without esophagitis 01/30/2010   Gastroparesis 01/30/2010    Patient's Medications  New Prescriptions   No medications on file  Previous Medications   ACETAMINOPHEN (TYLENOL) 325 MG TABLET    Take 2 tablets (650 mg total) by mouth every 4 (four) hours as needed for mild pain or moderate pain (or Fever >/= 101).   ALBUTEROL (VENTOLIN HFA) 108 (90 BASE) MCG/ACT INHALER    TAKE 2 PUFFS BY MOUTH EVERY 6 HOURS AS NEEDED FOR WHEEZE OR SHORTNESS OF BREATH   ALLOPURINOL (ZYLOPRIM) 300 MG TABLET    Take 1 tablet (300 mg total) by mouth daily.   ALPRAZOLAM (XANAX) 0.25 MG TABLET    TAKE 1 TABLET BY MOUTH 3 TIMES DAILY AS NEEDED FOR ANXIETY.   ARIPIPRAZOLE (ABILIFY) 5 MG TABLET    Take 1 tablet (5 mg total) by mouth daily.   ARMODAFINIL 250 MG TABLET    Take 1 tablet by mouth daily with breakfast.   ASPIRIN EC 81 MG TABLET    Take 81 mg by mouth in the morning. Swallow whole.   ATORVASTATIN (LIPITOR) 40 MG TABLET  Take 1 tablet (40 mg total) by mouth daily.   CALCIUM PO    Take 500 mg by mouth in the morning, at noon, and at bedtime.   CETIRIZINE (ZYRTEC) 10 MG TABLET    Take 10 mg by mouth at bedtime.   COLCHICINE 0.6 MG TABLET    Take 1.71m (2 tablets) then 0.626m(1 tablet) 1 hour after. Then, take 1 tablet every day for 7 days.   DAPTOMYCIN (CUBICIN) IVPB    Inject 500 mg into the vein daily. Indication:  R-great osteo/septic joint First Dose: Yes Last Day of Therapy:  09/14/22 Labs - Once weekly:   CBC/D, BMP, and CPK Labs - Every other week:  ESR and CRP Method of administration: IV Push Pull PICC line at the completion of IV antibiotics Method of administration may be changed at the discretion of home infusion pharmacist based upon assessment of the patient and/or caregiver's ability to self-administer the medication ordered.   DULOXETINE (CYMBALTA) 60 MG CAPSULE    Take 1 capsule (60 mg total) by mouth 2 (two) times daily.   ESTRADIOL (ESTRACE VAGINAL) 0.1 MG/GM VAGINAL CREAM    Place 1 g vaginally 2 (two) times a week.   FLUTICASONE (FLONASE) 50 MCG/ACT NASAL SPRAY    PLACE 2 SPRAYS INTO BOTH NOSTRILS DAILY AS NEEDED FOR ALLERGIES   GABAPENTIN (NEURONTIN) 300 MG CAPSULE    Take 300 mg by mouth 3 (three) times daily.   INSULIN PEN NEEDLE (B-D UF III MINI PEN NEEDLES) 31G X 5 MM MISC    USE TO INJECT FORTEO ONCE DAILY. DISCARD AFTER USE.   MECLIZINE (ANTIVERT) 25 MG TABLET    Take 1 tablet (25 mg total) by mouth 3 (three) times daily as needed for dizziness.   MELATONIN 5 MG TABS    Take 5 mg by mouth at bedtime.    METHOCARBAMOL (ROBAXIN) 500 MG TABLET    TAKE 1 TABLET BY MOUTH EVERY DAY AS NEEDED   METOPROLOL TARTRATE (LOPRESSOR) 50 MG TABLET    TAKE 1 TABLET BY MOUTH TWICE A DAY   MULTIPLE VITAMINS-MINERALS (BARIATRIC MULTIVITAMINS/IRON PO)    Take 1 each by mouth in the morning and at bedtime.   MULTIPLE VITAMINS-MINERALS (PRESERVISION AREDS 2) CAPS    Take 1 capsule by mouth in the morning and at bedtime.   MUPIROCIN OINTMENT (BACTROBAN) 2 %    Apply 1 application  topically 2 (two) times daily.   NORTRIPTYLINE (PAMELOR) 50 MG CAPSULE    Take 100 mg by mouth daily at 6 PM.   ONDANSETRON (ZOFRAN) 8 MG TABLET    Take 8 mg by mouth every 8 (eight) hours as needed for nausea.   OXCARBAZEPINE (TRILEPTAL) 150 MG TABLET    Take 150 mg by mouth in the morning and at bedtime.   PANTOPRAZOLE (PROTONIX) 40 MG TABLET    Take 1 tablet (40 mg total) by mouth daily.   PRESCRIPTION MEDICATION     CPAP- At bedtime   PROBIOTIC PRODUCT (PROBIOTIC ADVANCED PO)    Take 1 capsule by mouth at bedtime.   PROMETHAZINE (PHENERGAN) 25 MG TABLET    Take 25 mg by mouth every 6 (six) hours as needed for nausea or vomiting.   RESTASIS 0.05 % OPHTHALMIC EMULSION    Place 1 drop into both eyes in the morning and at bedtime.   SYSTANE ULTRA PF 0.4-0.3 % SOLN    Place 1 drop into both eyes 3 (three) times daily as needed (for  dryness).   TERIPARATIDE, RECOMBINANT, (FORTEO) 600 MCG/2.4ML SOPN    Inject 20 mcg into the skin daily.   TRAMADOL (ULTRAM) 50 MG TABLET    Take 1 tablet (50 mg total) by mouth every 6 (six) hours as needed for up to 5 days.  Modified Medications   No medications on file  Discontinued Medications   No medications on file    Subjective: Here for HFU for Hardware associated Osteomyelitis/septic arthritis of rt great toe. Patient is accompanied by her husband. She is getting IV daptomycin and ceftriaxone from rt arm PICC. She has mild redness at th entry site but nothing else like swelling, warmth and tenderness. Denies fevers, chills, Nasuea + , denies vomiting, abdominal pain and diarrhea. Fu with Dr Sherryle Lis 9/26 - cast was changed. Toe incision is well healed., no ulceration, moderate edema and mild tenderness at the surgical site. Patient says Dr Billee Cashing donald says foot has deteriorated. She is concerned about whether antibiotics would make any difference and she may eventually need amputation.   Review of Systems: all systems reviewed with pertinent positives and negatives as listed above  Past Medical History:  Diagnosis Date   Anemia    previously followed by Dr. Jamse Arn for anemia and elevated platelets   Anxiety    C. difficile colitis 10/01/2012   treated by [facility]   Chronic fatigue syndrome    Closed wedge compression fracture of T8 vertebra ([place]) 06/2021   DDD (degenerative disc disease), lumbar 08/19/2014   and facet arthroplasty & left lumbar radiculopathy (Dr.Ramos)    Depression    Dyssynergia    dyssynergenic defecation, contributing to fecal incontinence.   Edema    Fibromyalgia    Gastroparesis    followed at [facility]   GERD (gastroesophageal reflux disease)    History of kidney stones    History of vertebral fracture 06/30/2021   Hyperlipidemia    Kidney stone    Lumbar radiculopathy    Migraine    Neuropathy    Obstructive sleep apnea    Does  wear  CPAP   Osteoporosis    Paresthesia    [facility] Everette [facility]   Pelvic floor dysfunction    pelvic floor dyssynergy   Plantar fasciitis 02/2011   R foot   Pneumonia    2012   PONV (postoperative nausea and vomiting)    pt states has gastroparesis has difficulty taking antibiotics and narcotics has severe nausea and vomiting    Restless leg syndrome    S/P endometrial ablation 08/09/2006   Novasure Ablation   S/P epidural steroid injection 09/20/2014   Dr.Ramos   Tremor    Dr. Everette Rank   Urinary frequency    Urinary incontinence    Past Surgical History:  Procedure Laterality Date   CHOLECYSTECTOMY  07/2004   ENDOMETRIAL ABLATION  08/09/2006   Dr. Valentina Shaggy Ablation   FACET JOINT INJECTION  04/17/2017   Left L4-5 and L5-S1   FOOT SURGERY Right 06/29/2022   FUSION OF JOINT IN BIG TOE RT FOOT, Dr. Sherryle Lis   GASTRIC ROUX-EN-Y N/A 09/29/2019   Procedure: LAPAROSCOPIC ROUX-EN-Y GASTRIC BYPASS WITH UPPER ENDOSCOPY, ERAS Pathway;  Surgeon: Johnathan Hausen, MD;  Location: [facility];  Service: General;  Laterality: N/A;   IRRIGATION AND DEBRIDEMENT FOOT Right 08/03/2022   Procedure: foot bone biopsy and hardware removal;  Surgeon: Criselda Peaches, DPM;  Location: [facility];  Service: Podiatry;  Laterality: Right;   KNEE ARTHROPLASTY     KNEE  SURGERY  1999   R knee, Dr. Eddie Dibbles, torn cartilage   KYPHOPLASTY  11/06/2021   [id number]   RETINAL LASER PROCEDURE Right 08/28/2018   laser retinopexy   RIGHT/LEFT HEART CATH AND CORONARY ANGIOGRAPHY N/A 01/01/2018   Procedure:  RIGHT/LEFT HEART CATH AND CORONARY ANGIOGRAPHY;  Surgeon: Sherren Mocha, MD;  Location: [facility];  Service: Cardiovascular;  Laterality: N/A;   TONSILLECTOMY  1968   TONSILLECTOMY     TOTAL KNEE ARTHROPLASTY Right 09/05/2015   Procedure: RIGHT TOTAL KNEE ARTHROPLASTY;  Surgeon: Gaynelle Arabian, MD;  Location: [facility];  Service: Orthopedics;  Laterality: Right;   TOTAL KNEE ARTHROPLASTY Left 07/14/2018   Procedure: LEFT TOTAL KNEE ARTHROPLASTY;  Surgeon: Gaynelle Arabian, MD;  Location: [facility];  Service: Orthopedics;  Laterality: Left;   ULTRASOUND GUIDANCE FOR VASCULAR ACCESS  01/01/2018   Procedure: Ultrasound Guidance For Vascular Access;  Surgeon: Sherren Mocha, MD;  Location: [facility];  Service: Cardiovascular;;   UMBILICAL HERNIA REPAIR N/A 09/29/2019   Procedure: HERNIA REPAIR UMBILICAL ADULT;  Surgeon: Johnathan Hausen, MD;  Location: [facility];  Service: General;  Laterality: N/A;     Social History   Tobacco Use   Smoking status: Never    Passive exposure: Never   Smokeless tobacco: Never  Vaping Use   Vaping Use: Never used  Substance Use Topics   Alcohol use: No    Alcohol/week: 0.0 standard drinks of alcohol   Drug use: No    Family History  Problem Relation Age of Onset   Allergies Mother    Hypertension Mother    Heart disease Mother        possible valve problem - leaking valve   Macular degeneration Mother    Macular degeneration Father    Heart disease Father        pacemaker, CHF   Hypertension Father    Diabetes Father        borderline   Stroke Father 43   Kidney disease Father    Allergies Sister    Asthma Sister    Irritable bowel syndrome Sister    Macular degeneration Sister    Heart disease Maternal Grandmother    Heart disease Maternal Grandfather    Heart disease Paternal Grandmother    Heart disease Paternal Grandfather    Cancer Maternal Aunt        leukemia   Cancer Maternal Aunt    Colon cancer Maternal Aunt         late 60's   CAD Neg Hx     Allergies  Allergen Reactions   Erythromycin Nausea Only and Other (See Comments)    Abdominal pain, also   Meperidine Hcl Other (See Comments)    Made the patient feel hot and her heart raced- made her feel "flushed," also   Polyethyl Glyc-Propyl Glyc Pf Nausea And Vomiting and Other (See Comments)    RN confirmed with patient 9/4 (Miralax)   Dilaudid [Hydromorphone Hcl] Itching    Health Maintenance  Topic Date Due   COVID-19 Vaccine (6 - Pfizer risk series) 04/02/2022   INFLUENZA VACCINE  06/26/2022   COLONOSCOPY (Pts 45-68yr Insurance coverage will need to be confirmed)  12/04/2022   MAMMOGRAM  12/25/2022   PAP SMEAR-Modifier  02/26/2025   TETANUS/TDAP  06/21/2027   Pneumonia Vaccine 65 Years old  Completed   DEXA SCAN  Completed   Hepatitis C Screening  Completed   HIV Screening  Completed   Zoster Vaccines- Shingrix  Completed   HPV VACCINES  Aged Out    Objective: BP 135/85   Pulse 84   Temp 98.1 F (36.7 C) (Oral)   Wt 191 lb (86.6 kg)   LMP 07/27/2006   BMI 37.30 kg/m    Physical Exam Constitutional:      Appearance: Normal appearance. Obese  HENT:     Head: Normocephalic and atraumatic.      Mouth: Mucous membranes are moist.  Eyes:    Conjunctiva/sclera: Conjunctivae normal.     Pupils:   Cardiovascular:     Rate and Rhythm: Normal rate and regular rhythm.     Heart sounds:  Pulmonary:     Effort: Pulmonary effort is normal.     Breath sounds: Normal breath sounds.   Abdominal:     General: Non distended     Palpations: soft.   Musculoskeletal:        General: Normal range of motion.  Rt foot and leg is wrapped in a total contact cast with a walking boot. Picture of rt foot a week ago from husband's phone is below    Skin:    General: Skin is warm and dry.     Comments: Rt arm PICC with minimal redness at the entry site but no swelling, tenderness and warmth. Distal NV status intact    Neurological:      General: grossly non focal     Mental Status: awake, alert and oriented to person, place, and time.   Psychiatric:        Mood and Affect: Mood normal.   Lab Results Lab Results  Component Value Date   WBC 9.4 08/02/2022   HGB 12.3 08/02/2022   HCT 38.4 08/02/2022   MCV 93.0 08/02/2022   PLT 272 08/02/2022    Lab Results  Component Value Date   CREATININE 0.75 08/05/2022   BUN 17 08/05/2022   NA 143 08/05/2022   K 4.0 08/05/2022   CL 109 08/05/2022   CO2 28 08/05/2022    Lab Results  Component Value Date   ALT 46 (H) 07/31/2022   AST 44 (H) 07/31/2022   ALKPHOS 139 (H) 07/31/2022   BILITOT 0.4 07/31/2022    Lab Results  Component Value Date   CHOL 117 02/05/2022   HDL 48 02/05/2022   LDLCALC 45 02/05/2022   TRIG 137 02/05/2022   CHOLHDL 2.4 02/05/2022   No results found for: "LABRPR", "RPRTITER" No results found for: "HIV1RNAQUANT", "HIV1RNAVL", "CD4TABS"   Microbiology Results for orders placed or performed during the hospital encounter of 07/30/22  Culture, blood (Routine X 2) w Reflex to ID Panel     Status: None   Collection Time: 08/01/22  9:44 AM   Specimen: BLOOD LEFT ARM  Result Value Ref Range Status   Specimen Description   Final    BLOOD LEFT ARM Performed at [facility] [address], [place], [place] [address]    Special Requests   Final    BOTTLES DRAWN AEROBIC ONLY Blood Culture adequate volume Performed at [facility] [address] [place], [place] [address]    Culture   Final    NO GROWTH 5 DAYS Performed at [facility], [place] [address], [place], [place] [address]    Report Status 08/06/2022 FINAL  Final  Culture, blood (Routine X 2) w Reflex to ID Panel     Status: None   Collection Time: 08/01/22 10:28 AM   Specimen: BLOOD  Result Value Ref Range Status   Specimen Description   Final    BLOOD BLOOD LEFT HAND Performed at [facility] [address]  [place], [place] [address]    Special Requests   Final    Blood Culture adequate volume AEROBIC BOTTLE ONLY Performed at [facility] [address], [place], [place] [address]    Culture   Final    NO GROWTH 5 DAYS Performed at [facility], [place] [address] [place], [place] [address]    Report Status 08/06/2022 FINAL  Final  Surgical pcr screen     Status: None   Collection Time: 08/03/22  2:09 PM   Specimen: Nasal Mucosa; Nasal Swab  Result Value Ref Range Status   MRSA, PCR NEGATIVE NEGATIVE Final   Staphylococcus aureus NEGATIVE NEGATIVE Final    Comment: (NOTE) The Xpert SA Assay (FDA approved for NASAL specimens in patients 40 years of age and older), is one component of a comprehensive surveillance program. It is not intended to diagnose infection nor to guide or monitor treatment. Performed at [facility], [place] [address] [place], [place] [address]   Aerobic/Anaerobic Culture w Gram Stain (surgical/deep wound)     Status: None   Collection Time: 08/03/22  5:59 PM   Specimen: Wound; Body Fluid  Result Value Ref Range Status   Specimen Description   Final    WOUND Performed at [facility] [address] [place], [place] [address]    Special Requests   Final    RIGHT GREAT BIG TOE JOINT FLUID Performed at [facility] [address] [place], [place] [address]    Gram Stain NO WBC SEEN RARE GRAM POSITIVE COCCI   Final   Culture   Final    RARE STAPHYLOCOCCUS EPIDERMIDIS NO ANAEROBES ISOLATED Performed at [facility], [place] [address], [place], [place] [address]    Report Status 08/08/2022 FINAL  Final   Organism ID, Bacteria STAPHYLOCOCCUS EPIDERMIDIS  Final      Susceptibility   Staphylococcus epidermidis - MIC*    CIPROFLOXACIN 4 RESISTANT Resistant     ERYTHROMYCIN <=0.25 SENSITIVE Sensitive     GENTAMICIN 8 INTERMEDIATE Intermediate     OXACILLIN >=4 RESISTANT Resistant      TETRACYCLINE >=16 RESISTANT Resistant     VANCOMYCIN 2 SENSITIVE Sensitive     TRIMETH/SULFA 80 RESISTANT Resistant     CLINDAMYCIN <=0.25 SENSITIVE Sensitive     RIFAMPIN <=0.5 SENSITIVE Sensitive     Inducible Clindamycin NEGATIVE Sensitive     * RARE STAPHYLOCOCCUS EPIDERMIDIS  Aerobic/Anaerobic Culture w Gram Stain (surgical/deep wound)     Status: None   Collection Time: 08/03/22  6:07 PM   Specimen: Wound; Body Fluid  Result Value Ref Range Status   Specimen Description   Final    WOUND Performed at [facility] [address], [place], [place] [address]    Special Requests   Final    HARDWARE RIGHT GREAT BIG TOE Performed at [facility] [address] [place], [place] [address]    Gram Stain NO ORGANISMS SEEN NO WBC SEEN   Final   Culture   Final    RARE STAPHYLOCOCCUS EPIDERMIDIS NO ANAEROBES ISOLATED SEE SEPARATE REPORT Performed at [facility], [address], [place],  [address]    Report Status 08/13/2022 FINAL  Final   Organism ID, Bacteria STAPHYLOCOCCUS EPIDERMIDIS  Final      Susceptibility   Staphylococcus epidermidis - MIC*    CIPROFLOXACIN 4 RESISTANT Resistant     ERYTHROMYCIN <=0.25 SENSITIVE Sensitive     GENTAMICIN 8 INTERMEDIATE Intermediate     OXACILLIN >=4 RESISTANT Resistant     TETRACYCLINE >=16 RESISTANT Resistant     VANCOMYCIN 2 SENSITIVE Sensitive     TRIMETH/SULFA 80 RESISTANT Resistant     CLINDAMYCIN <=0.25 SENSITIVE Sensitive     RIFAMPIN <=0.5 SENSITIVE Sensitive     Inducible Clindamycin NEGATIVE Sensitive     * RARE STAPHYLOCOCCUS EPIDERMIDIS  Aerobic/Anaerobic Culture w Gram Stain (surgical/deep wound)     Status: None   Collection Time: 08/03/22  6:15 PM   Specimen: Wound  Result Value Ref Range Status   Specimen Description   Final    WOUND Performed at [facility], [place] [address], [place], [place] [address]    Special Requests   Final    NONE  DISTAL PHALANX BONE Performed at [facility] [address], [place], [place] [address]    Gram Stain NO ORGANISMS SEEN NO WBC SEEN   Final   Culture   Final    No growth aerobically or anaerobically. Performed at [facility], [place] [address] [place], [place] [address]    Report Status 08/08/2022 FINAL  Final  Aerobic/Anaerobic Culture w Gram Stain (surgical/deep wound)     Status: None   Collection Time: 08/03/22  6:20 PM   Specimen: Wound  Result Value Ref Range Status   Specimen Description   Final    WOUND Performed at [facility] [address], [place], [place] [address]    Special Requests   Final    NONE PROXIMAL PHALANX BONE Performed at [facility], [place] [address], [place], [place] [address]    Gram Stain NO ORGANISMS SEEN NO WBC SEEN   Final   Culture   Final    RARE STAPHYLOCOCCUS EPIDERMIDIS SUSCEPTIBILITIES PERFORMED ON PREVIOUS CULTURE WITHIN THE LAST 5 DAYS. NO ANAEROBES ISOLATED Performed at [facility], [place] [address], [place], [place] [address]    Report Status 08/08/2022 FINAL  Final   *Note: Due to a large number of results and/or encounters for the requested time period, some results have not been displayed. A complete set of results can be found in Results Review.   Imaging DG Foot Complete Right  Result Date: 08/21/2022 Please see detailed radiograph report in office note.  [place] EKG SITE RITE  Result Date: 08/08/2022 If Site Rite image not attached, placement could not be confirmed due to current cardiac rhythm.  NM WBC SCAN TUMOR LOC LIMITED  Result Date: 08/03/2022 CLINICAL DATA:  Suspicion of osteomyelitis. EXAM: NUCLEAR MEDICINE LEUKOCYTE SCAN TECHNIQUE: Following intravenous administration of radiolabeled white blood cells, images of the head, neck, trunk, and extremities were obtained on subsequent days. RADIOPHARMACEUTICALS:  30.6 technetium 99 Ceretec labeled  autologous leukocytes IV COMPARISON:  Radiograph 07/30/2022 FINDINGS: There is hyperemia to the RIGHT foot on the early arterial phase imaging. Delayed imaging there is focal accumulation of tagged white blood cells in the first ray at the level of the proximal phalanx. IMPRESSION: Hyperemia to the RIGHT foot and focal accumulation of tagged white blood cells in the proximal phalanx of the first RIGHT digit suggests infectious/inflammatory process including osteomyelitis. Electronically Signed   By: Suzy Bouchard M.D.   On: 08/03/2022 08:33   VAS [place] ABI  WITH/WO TBI  Result Date: 08/01/2022  LOWER EXTREMITY DOPPLER STUDY Patient Name:  BRYN SALINE  Date of Exam:   08/01/2022 Medical Rec #: [id number]      Accession #:    [id number] Date of Birth: 27-Jun-1956       Patient Gender: F Patient Age:   66 years Exam Location:  [facility] Procedure:      VAS [place] ABI WITH/WO TBI Referring Phys: Celesta Gentile --------------------------------------------------------------------------------  Indications: Cellulitis right foot (great toe) High Risk Factors: Hyperlipidemia.  Comparison Study: No previous exams Performing Technologist: Hill, Jody RVT, RDMS  Examination Guidelines: A complete evaluation includes at minimum, Doppler waveform signals and systolic blood pressure reading at the level of bilateral brachial, anterior tibial, and posterior tibial arteries, when vessel segments are accessible. Bilateral testing is considered an integral part of a complete examination. Photoelectric Plethysmograph (PPG) waveforms and toe systolic pressure readings are included as required and additional duplex testing as needed. Limited examinations for reoccurring indications may be performed as noted.  ABI Findings: +---------+------------------+-----+---------+------------------+ Right    Rt Pressure (mmHg)IndexWaveform Comment            +---------+------------------+-----+---------+------------------+ Brachial 132                     triphasic                   +---------+------------------+-----+---------+------------------+ PTA      154               1.12 triphasic                   +---------+------------------+-----+---------+------------------+ DP       160               1.17 triphasic                   +---------+------------------+-----+---------+------------------+ Great Toe                       Normal   too large for cuff +---------+------------------+-----+---------+------------------+ +---------+------------------+-----+---------+-------+ Left     Lt Pressure (mmHg)IndexWaveform Comment +---------+------------------+-----+---------+-------+ Brachial 137                    triphasic        +---------+------------------+-----+---------+-------+ PTA      143               1.04 triphasic        +---------+------------------+-----+---------+-------+ DP       143               1.04 triphasic        +---------+------------------+-----+---------+-------+ [place]               1.00 Normal           +---------+------------------+-----+---------+-------+ +-------+-----------+-----------+------------+------------+ ABI/TBIToday's ABIToday's TBIPrevious ABIPrevious TBI +-------+-----------+-----------+------------+------------+ Right  1.17                                           +-------+-----------+-----------+------------+------------+ Left   1.04       1.00                                +-------+-----------+-----------+------------+------------+  Unable to obtain TBI on right side due to size  of toe.  Summary: Right: Resting right ankle-brachial index is within normal range. Left: Resting left ankle-brachial index is within normal range. The left toe-brachial index is normal. *See table(s) above for measurements and observations.  Electronically signed by Harold Barban MD on 08/01/2022 at 10:30:09 PM.    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 Path negative for OM but multiple cultures including joint and bone + for MRSE - Also concerns for charcot foot per Podiatry Dr Sherryle Lis  Complete 6 weeks of IV daptomycin and IV ceftriaxone on 09/14/22 PICC to be removed after last dose on 09/14/22 Fu in 2-3 weeks  Fu with Orthopedics as instructed   # Medication Monitoring  9/18 CBC and CMP noted, CRP 3, CPK 59  # PICC  - some redness at the entry site but otherwise no symptoms - Discussed to immediately notify in case of any increasing redness, pain, swelling and tenderness in which case will need to be removed   I have personally spent 45  minutes involved in face-to-face and non-face-to-face activities for this patient on the day of the visit. Professional time spent includes the following activities: Preparing to see the patient (review of tests), Obtaining and/or reviewing separately obtained history (admission/discharge record), Performing a medically appropriate examination and/or evaluation , Ordering medications/tests/procedures, referring and communicating with other health care professionals, Documenting clinical information in the EMR, Independently interpreting results (not separately reported), Communicating results to the patient/family/caregiver, Counseling and educating the patient/family/caregiver and Care coordination (not separately  reported).   Wilber Oliphant, [facility] 08/29/2022, 3:27 PM

## 2022-08-30 ENCOUNTER — Ambulatory Visit: Payer: Medicare Other | Admitting: Podiatry

## 2022-08-30 DIAGNOSIS — M869 Osteomyelitis, unspecified: Secondary | ICD-10-CM | POA: Diagnosis not present

## 2022-08-30 DIAGNOSIS — M14671 Charcot's joint, right ankle and foot: Secondary | ICD-10-CM | POA: Diagnosis not present

## 2022-08-31 ENCOUNTER — Telehealth: Payer: Self-pay | Admitting: Pharmacist

## 2022-08-31 DIAGNOSIS — K219 Gastro-esophageal reflux disease without esophagitis: Secondary | ICD-10-CM | POA: Diagnosis not present

## 2022-08-31 DIAGNOSIS — R112 Nausea with vomiting, unspecified: Secondary | ICD-10-CM | POA: Diagnosis not present

## 2022-08-31 DIAGNOSIS — Z79899 Other long term (current) drug therapy: Secondary | ICD-10-CM | POA: Diagnosis not present

## 2022-08-31 NOTE — Chronic Care Management (AMB) (Signed)
Chronic Care Management Pharmacy Assistant   Name: [name]  MRN: [id number] DOB: 1956/01/30  Reason for Encounter: Hospital Follow up per Jeni Salles   Recent office visits:  07/26/22 Rita Ohara, MD - Patient presented for Annual physical exam and other concerns. Stopped Benzonatate, Stopped Alka -seltzer Plus. Stopped Doxycycline, Stopped Levofloxacin, Stopped Methylprednisolone.   05/01/22 Denita Lung, MD - Patient presented for Viral URI. No medication changes.  03/23/22 Denita Lung, MD - Patient presented for Cough productive of purulent sputum and other concerns. Prescribed Amoxicillin. Stopped Doxycycline.  03/16/22 Irene Pap, PA-C - Patient presented via video for Cough productive of purulent sputum and other concerns. Prescribed Benzonatate. Prescribed Doxycycline.    Recent consult visits:  08/30/22 Criselda Peaches, DPM (Podiatry) Patient presented for Charcot's joint of right foot. No medication changes.  08/29/22 Rosiland Oz, MD (Inf Disease) - Patient presented for Osteomyelitis of great toe of right foot and other concerns. No medication changes.  08/28/22 Karl Luke, MD (Neurology) - Patient presented for Idiopathic peripheral neuropathy and other concerns. Stopped Nortriptyline.  08/21/22 Criselda Peaches, DPM (Podiatry) Patient presented for Charcot's joint of right foot. No medication changes.  08/13/22 Abdul-Mutakallim, Joycelyn Schmid, RN - Patient presented for Gouty arthritis of toe of right foot. Area dressed and cast applied no medication changes.  08/03/22 Criselda Peaches, DPM (Podiatry) - Patient presented for Foot Bone Biopsy and hardware removal of right toe. No medication changes noted.  07/26/22 Vu, Rockey Situ, MD (Inf Disease) - Patient presented via video for Abnormal musculoskeletal diagnostic imaging and other concerns. Stopped Cefadroxil.  07/24/22 Vu, Rockey Situ, MD (Inf Disease) - Patient presented for Joint inflammation and other  concerns. Prescribed Cefadroxil.  07/19/22 Criselda Peaches, DPM (Podiatry) - Patient presented for Deep postoperative wound infection. Area Aspirated no medication changes.  07/17/22 Criselda Peaches, DPM (Podiatry) - Patient presented for [facility] of right foot and other concerns. Prescribed Doxycycline Hyclate. Prescribed Levofloxacin. Stopped Cephalexin.  07/11/22 Ammie Ferrier, LPN (Podiatry) - Patient presented for Gouty arthritis of toe and Re-evaluation of right hallux. No medication changes.  07/05/22 Criselda Peaches, DPM (Podiatry) - Patient presented for gouty arthritis of right toe. Prescribed Cephalexin. Stopped Amoxicillin.  06/11/22 Criselda Peaches, DPM (Podiatry) - Patient presented for Achilles tendinitis of right leg. No medication changes.  06/08/22 Reinhartsen, Kevan Ny, PT - Patient presented for Pain in right ankle and joints of right foot and other concerns. No medication changes.  06/06/22 Reinhartsen, Kevan Ny, PT - Patient presented for Pain in right ankle and joints of right foot and other concerns. No medication changes.  06/06/22 Bo Merino, MD (Rheumatology) - Patient presented for Idiopathic chronic gout of multiple sites without tophus and other concerns. Administered Trigger point Injection. No medication changes.  05/24/22 Evelene Croon, PTA - Patient presented for Pain in right ankle joints of right foot and other concerns. No medication changes.  05/08/22 McDonald, Stephan Minister, DPM (Podiatry) - Patient presented for Abrasion of skin. Prescribed Mupirocin 2%.  04/26/22 Criselda Peaches, DPM (Podiatry) - Patient presented for Achilles tendinitis of right leg and other concerns. Prescribed Tramadol HCL.  04/24/22 Reinhartsen, Kevan Ny, PT - Patient presented for Difficulty in walking not elsewhere classified and other concerns. No medication changes.  04/17/22 Karl Luke, MD (Neurology) - Patient presented for Idiopathic peripheral neuropathy. No  medication changes.  03/22/22 Criselda Peaches, DPM - Patient presented for Achilles tendinitis of right leg and other  concerns. Prescribed Methylprednisolone.   03/12/22 Mozingo, [facility] Ogren [facility] - Patient presented for Insomnia unspecified and other concerns. No other visit details available.   Hospital visits:  Medication Reconciliation was completed by comparing discharge summary, patient's EMR and Pharmacy list, and upon discussion with patient.  Patient presented to [facility] on 07/30/22 due to Osteomyelitis of great toe of right foot. Patient was present for 9 days.  New?Medications Started at [facility] Discharge:?? -started  daptomycin IVPB (CUBICIN)  Medication Changes at Hospital Discharge: -Changed  acetaminophen (TYLENOL) HYDROcodone-acetaminophen (NORCO/VICODIN)  Medications Discontinued at Hospital Discharge: -Stopped  cefadroxil 500 MG capsule (DURICEF  Medications that remain the same after Hospital Discharge:??  -All other medications will remain the same.    Medications: Outpatient Encounter Medications as of 08/31/2022  Medication Sig   acetaminophen (TYLENOL) 325 MG tablet Take 2 tablets (650 mg total) by mouth every 4 (four) hours as needed for mild pain or moderate pain (or Fever >/= 101).   albuterol (VENTOLIN HFA) 108 (90 Base) MCG/ACT inhaler TAKE 2 PUFFS BY MOUTH EVERY 6 HOURS AS NEEDED FOR WHEEZE OR SHORTNESS OF BREATH (Patient taking differently: Inhale 2 puffs into the lungs every 6 (six) hours as needed for wheezing or shortness of breath.)   allopurinol (ZYLOPRIM) 300 MG tablet Take 1 tablet (300 mg total) by mouth daily.   ALPRAZolam (XANAX) 0.25 MG tablet TAKE 1 TABLET BY MOUTH 3 TIMES DAILY AS NEEDED FOR ANXIETY.   ARIPiprazole (ABILIFY) 5 MG tablet Take 1 tablet (5 mg total) by mouth daily.   Armodafinil 250 MG tablet Take 1 tablet by mouth daily with breakfast. (Patient taking differently: Take 125 mg by mouth daily as  needed (for extreme sleepiness).)   aspirin EC 81 MG tablet Take 81 mg by mouth in the morning. Swallow whole.   atorvastatin (LIPITOR) 40 MG tablet Take 1 tablet (40 mg total) by mouth daily. (Patient not taking: Reported on 08/29/2022)   CALCIUM PO Take 500 mg by mouth in the morning, at noon, and at bedtime.   cetirizine (ZYRTEC) 10 MG tablet Take 10 mg by mouth at bedtime.   colchicine 0.6 MG tablet Take 1.67m (2 tablets) then 0.672m(1 tablet) 1 hour after. Then, take 1 tablet every day for 7 days. (Patient taking differently: Take 0.6-1.2 mg by mouth See admin instructions. Take 1.2 mg by mouth, then 0.6 mg one hour later as directed for gout flares. Take 0.6 mg once a day for 7 days thereafter.)   daptomycin (CUBICIN) IVPB Inject 500 mg into the vein daily. Indication:  R-great osteo/septic joint First Dose: Yes Last Day of Therapy:  09/14/22 Labs - Once weekly:  CBC/D, BMP, and CPK Labs - Every other week:  ESR and CRP Method of administration: IV Push Pull PICC line at the completion of IV antibiotics Method of administration may be changed at the discretion of home infusion pharmacist based upon assessment of the patient and/or caregiver's ability to self-administer the medication ordered.   DULoxetine (CYMBALTA) 60 MG capsule Take 1 capsule (60 mg total) by mouth 2 (two) times daily. (Patient taking differently: Take 60 mg by mouth in the morning and at bedtime.)   estradiol (ESTRACE VAGINAL) 0.1 MG/GM vaginal cream Place 1 g vaginally 2 (two) times a week.   fluticasone (FLONASE) 50 MCG/ACT nasal spray PLACE 2 SPRAYS INTO BOTH NOSTRILS DAILY AS NEEDED FOR ALLERGIES (Patient taking differently: Place 2 sprays into both nostrils daily.)   gabapentin (NEURONTIN) 300 MG  capsule Take 300 mg by mouth 3 (three) times daily.   Insulin Pen Needle (B-D UF III MINI PEN NEEDLES) 31G X 5 MM MISC USE TO INJECT FORTEO ONCE DAILY. DISCARD AFTER USE.   meclizine (ANTIVERT) 25 MG tablet Take 1 tablet (25  mg total) by mouth 3 (three) times daily as needed for dizziness.   melatonin 5 MG TABS Take 5 mg by mouth at bedtime.    methocarbamol (ROBAXIN) 500 MG tablet TAKE 1 TABLET BY MOUTH EVERY DAY AS NEEDED (Patient taking differently: Take 500 mg by mouth 2 (two) times daily as needed for muscle spasms.)   metoprolol tartrate (LOPRESSOR) 50 MG tablet TAKE 1 TABLET BY MOUTH TWICE A DAY (Patient taking differently: Take 50 mg by mouth in the morning and at bedtime.)   Multiple Vitamins-Minerals (BARIATRIC MULTIVITAMINS/IRON PO) Take 1 each by mouth in the morning and at bedtime.   Multiple Vitamins-Minerals (PRESERVISION AREDS 2) CAPS Take 1 capsule by mouth in the morning and at bedtime.   mupirocin ointment (BACTROBAN) 2 % Apply 1 application  topically 2 (two) times daily. (Patient taking differently: Apply 1 application  topically 2 (two) times daily as needed (to affected areas).)   nortriptyline (PAMELOR) 50 MG capsule Take 100 mg by mouth daily at 6 PM. (Patient not taking: Reported on 08/29/2022)   ondansetron (ZOFRAN) 8 MG tablet Take 8 mg by mouth every 8 (eight) hours as needed for nausea.   OXcarbazepine (TRILEPTAL) 150 MG tablet Take 150 mg by mouth in the morning and at bedtime.   pantoprazole (PROTONIX) 40 MG tablet Take 1 tablet (40 mg total) by mouth daily. (Patient taking differently: Take 40 mg by mouth at bedtime.)   PRESCRIPTION MEDICATION CPAP- At bedtime   Probiotic Product (PROBIOTIC ADVANCED PO) Take 1 capsule by mouth at bedtime.   promethazine (PHENERGAN) 25 MG tablet Take 25 mg by mouth every 6 (six) hours as needed for nausea or vomiting.   RESTASIS 0.05 % ophthalmic emulsion Place 1 drop into both eyes in the morning and at bedtime.   SYSTANE ULTRA PF 0.4-0.3 % SOLN Place 1 drop into both eyes 3 (three) times daily as needed (for dryness).   Teriparatide, Recombinant, (FORTEO) 600 MCG/2.4ML SOPN Inject 20 mcg into the skin daily.   traMADol (ULTRAM) 50 MG tablet Take 1 tablet  (50 mg total) by mouth every 6 (six) hours as needed for up to 5 days.   No facility-administered encounter medications on file as of 08/31/2022.  [place] for General Review Call  Adherence Review:  Does the Clinical Pharmacist Assistant have access to adherence rates? Yes Adherence rates for STAR metric medications  Atorvastatin 40 mg - Last filled 08/04/22 90 [facility] Atorvastatin 40 mg - Last filled 05/07/22 90 [facility] at [facility]  Does the patient have >5 day gap between last estimated fill dates for any of the above medications or other medication gaps? No    Disease State Questions:  Able to connect with Patient? Yes Did patient have any problems with their health recently? Yes Patient reports her foot is her main issue at present. Have you had any admissions or emergency room visits or worsening of your condition(s) since last visit? Yes  Have you had any visits with new specialists or providers since your last visit? No  Have you had any new health care problem(s) since your last visit? No New problem(s) reported: Have you run out of any of your medications since  you last spoke with clinical pharmacist? No  Are there any medications you are not taking as prescribed? No  Are you having any issues or side effects with your medications? No  Do you have any other health concerns or questions you want to discuss with your Clinical Pharmacist before your next visit? No  Are there any health concerns that you feel we can do a better job addressing? No  Are you having any problems with any of the following since the last visit: (select all that apply)  None  12. Any falls since last visit? No   13. Any increased or uncontrolled pain since last visit? No  Additional Details? Patient reports she has been in contact with Podiatry and infectious disease for her pic line. She reports no concerns at this time or questions.  Care Gaps: COVID Booster - Overdue Flu Vaccine -  Overdue CCM- Declined BP - 134/85 08/29/22 AWV- 07/26/22 Lab Results  Component Value Date   HGBA1C 5.9 (A) 07/26/2022    Star Rating Drugs: Atorvastatin 40 mg - Last filled 08/04/22 90 DS at [facility] Pharmacist Assistant [phone]

## 2022-09-02 ENCOUNTER — Other Ambulatory Visit: Payer: Self-pay | Admitting: Family Medicine

## 2022-09-03 DIAGNOSIS — M869 Osteomyelitis, unspecified: Secondary | ICD-10-CM | POA: Diagnosis not present

## 2022-09-03 NOTE — Progress Notes (Signed)
Subjective:  Patient ID: [name], female    DOB: 1956/04/25,  MRN: [id number]  Chief Complaint  Patient presents with   Routine Post Op    DOS 06/29/2022 FUSION OF JOINT IN BIG TOE RT FOOT. PER Emili Mcloughlin - total contact cast change, wound care    DOS: 08/03/2022 Procedure: Hardware removal and bone biopsies  66 y.o. female returns for post-op check.  Here for cast change today  Review of Systems: Negative except as noted in the HPI. Denies N/V/F/Ch.   Objective:  There were no vitals filed for this visit. There is no height or weight on file to calculate BMI. Constitutional Well developed. Well nourished.  Vascular Foot warm and well perfused. Capillary refill normal to all digits.  Calf is soft and supple, no posterior calf or knee pain, negative Homans' sign  Neurologic Normal speech. Oriented to person, place, and time. Epicritic sensation to light touch grossly reduced bilaterally.  Dermatologic Toe incision is well-healed.  Medial heel hyperkeratosis is improved.  Erythema has resolved  Orthopedic: No tenderness in the toe.  Moderate edema, somewhat improved since last visit.   Multiple view plain film radiographs: Interval removal of the hardware increasing destruction of the IPJ of the hallux  Blood culture with staph epidermidis, bone biopsy was negative Assessment:   1. Charcot's joint of right foot    Plan:  Patient was evaluated and treated and all questions answered.  S/p foot surgery right -She will continue her IV antibiotics.  We will continue casting for this until she has completed her IV antibiotics -WB Status: WBAT in total contact cast with walking boot, new cast was applied today   Return in about 1 week (around 09/06/2022) for total contact casting.

## 2022-09-04 ENCOUNTER — Encounter: Payer: Self-pay | Admitting: Internal Medicine

## 2022-09-06 ENCOUNTER — Ambulatory Visit (INDEPENDENT_AMBULATORY_CARE_PROVIDER_SITE_OTHER): Payer: Medicare Other

## 2022-09-06 ENCOUNTER — Ambulatory Visit: Payer: Medicare Other | Admitting: Podiatry

## 2022-09-06 DIAGNOSIS — L97521 Non-pressure chronic ulcer of other part of left foot limited to breakdown of skin: Secondary | ICD-10-CM

## 2022-09-06 DIAGNOSIS — M869 Osteomyelitis, unspecified: Secondary | ICD-10-CM | POA: Diagnosis not present

## 2022-09-06 DIAGNOSIS — M14671 Charcot's joint, right ankle and foot: Secondary | ICD-10-CM | POA: Diagnosis not present

## 2022-09-06 MED ORDER — GENTAMICIN SULFATE 0.1 % EX CREA: 1.0000 | TOPICAL_CREAM | Freq: Two times a day (BID) | CUTANEOUS | 0 refills | Status: DC

## 2022-09-09 NOTE — Progress Notes (Signed)
Subjective:  Patient ID: [name], female    DOB: 06/12/1956,  MRN: [id number]  Chief Complaint  Patient presents with   Charcot's joint    Right foot -  **Total contact cast change* DOS 06/29/2022 FUSION OF JOINT IN BIG TOE RT FOOT. PER Azalia Neuberger    DOS: 08/03/2022 Procedure: Hardware removal and bone biopsies  66 y.o. female returns for post-op check.  Here for cast change today for evaluation of the left great toe ulcer  Review of Systems: Negative except as noted in the HPI. Denies N/V/F/Ch.   Objective:  There were no vitals filed for this visit. There is no height or weight on file to calculate BMI. Constitutional Well developed. Well nourished.  Vascular Foot warm and well perfused. Capillary refill normal to all digits.  Calf is soft and supple, no posterior calf or knee pain, negative Homans' sign  Neurologic Normal speech. Oriented to person, place, and time. Epicritic sensation to light touch grossly reduced bilaterally.  Dermatologic Toe incision is well-healed.  Medial heel hyperkeratosis is improved.  Erythema has resolved, there is no cellulitis  The left great toe ulcer has a stable appearance there is no signs of infection there is a fully granular wound bed mild hyperkeratosis, limited to breakdown of skin  Orthopedic: No tenderness in the toe.  Moderate edema, somewhat improved since last visit.       Multiple view plain film radiographs: New films taken today and compared to last films show no significant increase in destructive changes, appears to be stable at this point  Blood culture with staph epidermidis, bone biopsy was negative Assessment:   1. Charcot's joint of right foot   2. Skin ulcer of left great toe, limited to breakdown of skin [facility])    Plan:  Patient was evaluated and treated and all questions answered.  S/p foot surgery right -She will continue her IV antibiotics.  We will continue casting for this until she has completed her IV  antibiotics -WB Status: WBAT in total contact cast with walking boot, new cast was applied today -X-rays taken and reviewed appears to be stable and no further destructive changes noted   Regarding her left hallux ulceration this appears to be limited to breakdown of skin I did debride the mild hyperkeratosis surrounding this in an excisional manner with a sharp scalpel.  There are no signs of infection.  She will continue to change with Garamycin ointment which was sent to her pharmacy.   Return in about 1 week (around 09/13/2022) for total contact casting.

## 2022-09-10 DIAGNOSIS — M869 Osteomyelitis, unspecified: Secondary | ICD-10-CM | POA: Diagnosis not present

## 2022-09-11 ENCOUNTER — Ambulatory Visit: Payer: Medicare Other | Admitting: Podiatry

## 2022-09-11 ENCOUNTER — Encounter: Payer: Self-pay | Admitting: Adult Health

## 2022-09-11 ENCOUNTER — Ambulatory Visit: Payer: Medicare Other | Admitting: Adult Health

## 2022-09-11 DIAGNOSIS — F331 Major depressive disorder, recurrent, moderate: Secondary | ICD-10-CM | POA: Diagnosis not present

## 2022-09-11 DIAGNOSIS — G4700 Insomnia, unspecified: Secondary | ICD-10-CM | POA: Diagnosis not present

## 2022-09-11 DIAGNOSIS — M14671 Charcot's joint, right ankle and foot: Secondary | ICD-10-CM

## 2022-09-11 DIAGNOSIS — F411 Generalized anxiety disorder: Secondary | ICD-10-CM

## 2022-09-11 DIAGNOSIS — F3181 Bipolar II disorder: Secondary | ICD-10-CM | POA: Diagnosis not present

## 2022-09-11 MED ORDER — ESZOPICLONE 2 MG PO TABS: 2.0000 mg | ORAL_TABLET | Freq: Every evening | ORAL | 2 refills | Status: DC | PRN

## 2022-09-11 NOTE — Progress Notes (Signed)
[name] [id number] 12-12-1955 66 y.o.  Subjective:   Patient ID:  [name] is a 66 y.o. (DOB 1956/11/03) female.  Chief Complaint: No chief complaint on file.   HPI [name] presents to the office today for follow-up of anxiety, depression, and insomnia.  Describes mood today as "ok". Pleasant. Mood symptoms - reports depression and anxiety - "some with situational stressors". Denies irritability - "occasionally". Denies worry and ruminations - "some about infection in foot". Mood is consistent. Reports health concerns - right foot - infection in her toe - limited mobility. Stating "I'm doing alright". Feels like medications are helpful - willing to consider a sleep aid. Stable interest and motivation. Taking medications as prescribed.  Energy levels lower. Active, does not have a regular exercise routine with current physical disabilities.  Enjoys some usual interests and activities. Married. Lives with husband. Has 2 sons - one local and another one in [place]. Mother local - 67. Spending time with family. Appetite ade quate. Weight gain with inactivity - 185 pounds. Sleeps better some nights than others. Averages 5 hours. Focus and concentration stable. Completing tasks. Managing aspects of household. Retired. Denies SI or HI.  Denies AH or VH Denies self harm. Denies substance use.  Bariatric surgery 11/20.   [place] Office Visit from 07/26/2022 in [place] Most recent reading at 07/26/2022  2:41 PM Video Visit from 07/26/2022 in [facility] Most recent reading at 07/26/2022  9:13 AM Office Visit from 07/24/2022 in [facility] Most recent reading at 07/24/2022  2:28 PM Video Visit from 01/17/2022 in [facility] Most recent reading at 01/17/2022 10:07 AM Office Visit from 06/28/2021 in [facility] Most recent reading at 06/28/2021 10:03 AM   PHQ-2 Total Score 2 1 0 0 1  PHQ-9 Total Score 9 -- -- 1 7      Flowsheet Row ED to Hosp-Admission (Discharged) from 07/30/2022 in [facility] from 12/31/2021 in [facility] ED from 07/04/2021 in [facility]  C-SSRS RISK CATEGORY No Risk No Risk No Risk        Review of Systems:  Review of Systems  Musculoskeletal:  Negative for gait problem.  Neurological:  Negative for tremors.  Psychiatric/Behavioral:         Please refer to HPI    Medications: I have reviewed the patient's current medications.  Current Outpatient Medications  Medication Sig Dispense Refill   acetaminophen (TYLENOL) 325 MG tablet Take 2 tablets (650 mg total) by mouth every 4 (four) hours as needed for mild pain or moderate pain (or Fever >/= 101).     albuterol (VENTOLIN HFA) 108 (90 Base) MCG/ACT inhaler TAKE 2 PUFFS BY MOUTH EVERY 6 HOURS AS NEEDED FOR WHEEZE OR SHORTNESS OF BREATH (Patient taking differently: Inhale 2 puffs into the lungs every 6 (six) hours as needed for wheezing or shortness of breath.) 8.5 each 0   allopurinol (ZYLOPRIM) 300 MG tablet Take 1 tablet (300 mg total) by mouth daily. 90 tablet 3   ALPRAZolam (XANAX) 0.25 MG tablet TAKE 1 TABLET BY MOUTH 3 TIMES DAILY AS NEEDED FOR ANXIETY. 90 tablet 2   ARIPiprazole (ABILIFY) 5 MG tablet Take 1 tablet (5 mg total) by mouth daily. 90 tablet 3   Armodafinil 250 MG tablet Take 1 tablet by mouth daily with breakfast. (Patient taking differently: Take 125 mg  by mouth daily as needed (for extreme sleepiness).) 30 tablet 0   aspirin EC 81 MG tablet Take 81 mg by mouth in the morning. Swallow whole.     atorvastatin (LIPITOR) 40 MG tablet Take 1 tablet (40 mg total) by mouth daily. (Patient not taking: Reported on 08/29/2022) 90 tablet 3   CALCIUM PO Take 500 mg by mouth in the morning, at noon, and at bedtime.     cetirizine (ZYRTEC) 10 MG tablet Take 10 mg by mouth at bedtime.      colchicine 0.6 MG tablet Take 1.30m (2 tablets) then 0.613m(1 tablet) 1 hour after. Then, take 1 tablet every day for 7 days. (Patient taking differently: Take 0.6-1.2 mg by mouth See admin instructions. Take 1.2 mg by mouth, then 0.6 mg one hour later as directed for gout flares. Take 0.6 mg once a day for 7 days thereafter.) 10 tablet 2   daptomycin (CUBICIN) IVPB Inject 500 mg into the vein daily. Indication:  R-great osteo/septic joint First Dose: Yes Last Day of Therapy:  09/14/22 Labs - Once weekly:  CBC/D, BMP, and CPK Labs - Every other week:  ESR and CRP Method of administration: IV Push Pull PICC line at the completion of IV antibiotics Method of administration may be changed at the discretion of home infusion pharmacist based upon assessment of the patient and/or caregiver's ability to self-administer the medication ordered. 37 Units 0   DULoxetine (CYMBALTA) 60 MG capsule Take 1 capsule (60 mg total) by mouth 2 (two) times daily. (Patient taking differently: Take 60 mg by mouth in the morning and at bedtime.) 180 capsule 3   estradiol (ESTRACE VAGINAL) 0.1 MG/GM vaginal cream Place 1 g vaginally 2 (two) times a week. 42.5 g 2   fluticasone (FLONASE) 50 MCG/ACT nasal spray PLACE 2 SPRAYS INTO BOTH NOSTRILS DAILY AS NEEDED FOR ALLERGIES (Patient taking differently: Place 2 sprays into both nostrils daily.) 16 mL 2   gabapentin (NEURONTIN) 300 MG capsule Take 300 mg by mouth 3 (three) times daily.     gentamicin cream (GARAMYCIN) 0.1 % Apply 1 Application topically in the morning and at bedtime. 30 g 0   Insulin Pen Needle (B-D UF III MINI PEN NEEDLES) 31G X 5 MM MISC USE TO INJECT FORTEO ONCE DAILY. DISCARD AFTER USE. 100 each 2   meclizine (ANTIVERT) 25 MG tablet Take 1 tablet (25 mg total) by mouth 3 (three) times daily as needed for dizziness. 30 tablet 0   melatonin 5 MG TABS Take 5 mg by mouth at bedtime.      methocarbamol (ROBAXIN) 500 MG tablet TAKE 1 TABLET BY MOUTH EVERY DAY  AS NEEDED (Patient taking differently: Take 500 mg by mouth 2 (two) times daily as needed for muscle spasms.) 30 tablet 2   metoprolol tartrate (LOPRESSOR) 50 MG tablet TAKE 1 TABLET BY MOUTH TWICE A DAY 180 tablet 1   Multiple Vitamins-Minerals (BARIATRIC MULTIVITAMINS/IRON PO) Take 1 each by mouth in the morning and at bedtime.     Multiple Vitamins-Minerals (PRESERVISION AREDS 2) CAPS Take 1 capsule by mouth in the morning and at bedtime.     mupirocin ointment (BACTROBAN) 2 % Apply 1 application  topically 2 (two) times daily. (Patient taking differently: Apply 1 application  topically 2 (two) times daily as needed (to affected areas).) 30 g 2   nortriptyline (PAMELOR) 50 MG capsule Take 100 mg by mouth daily at 6 PM. (Patient not taking: Reported on 08/29/2022)  ondansetron (ZOFRAN) 8 MG tablet Take 8 mg by mouth every 8 (eight) hours as needed for nausea.  0   OXcarbazepine (TRILEPTAL) 150 MG tablet Take 150 mg by mouth in the morning and at bedtime.     pantoprazole (PROTONIX) 40 MG tablet Take 1 tablet (40 mg total) by mouth daily. (Patient taking differently: Take 40 mg by mouth at bedtime.) 90 tablet 0   PRESCRIPTION MEDICATION CPAP- At bedtime     Probiotic Product (PROBIOTIC ADVANCED PO) Take 1 capsule by mouth at bedtime.     promethazine (PHENERGAN) 25 MG tablet Take 25 mg by mouth every 6 (six) hours as needed for nausea or vomiting.     RESTASIS 0.05 % ophthalmic emulsion Place 1 drop into both eyes in the morning and at bedtime.     SYSTANE ULTRA PF 0.4-0.3 % SOLN Place 1 drop into both eyes 3 (three) times daily as needed (for dryness).     Teriparatide, Recombinant, (FORTEO) 600 MCG/2.4ML SOPN Inject 20 mcg into the skin daily. 7.2 mL 0   No current facility-administered medications for this visit.    Medication Side Effects: None  Allergies:  Allergies  Allergen Reactions   Erythromycin Nausea Only and Other (See Comments)    Abdominal pain, also   Meperidine Hcl Other  (See Comments)    Made the patient feel hot and her heart raced- made her feel "flushed," also   Polyethyl Glyc-Propyl Glyc Pf Nausea And Vomiting and Other (See Comments)    RN confirmed with patient 9/4 (Miralax)   Dilaudid [Hydromorphone Hcl] Itching    Past Medical History:  Diagnosis Date   Anemia    previously followed by Dr. Jamse Arn for anemia and elevated platelets   Anxiety    C. difficile colitis 10/01/2012   treated by [facility]   Chronic fatigue syndrome    Closed wedge compression fracture of T8 vertebra ([place]) 06/2021   DDD (degenerative disc disease), lumbar 08/19/2014   and facet arthroplasty & left lumbar radiculopathy (Dr.Ramos)   Depression    Dyssynergia    dyssynergenic defecation, contributing to fecal incontinence.   Edema    Fibromyalgia    Gastroparesis    follow[facility] at [facility]   GERD (gastroesophageal reflux disease)    History of kidney stones    History of vertebral fracture 06/30/2021   Hyperlipidemia    Kidney stone    Lumbar radiculopathy    Migraine    Neuropathy    Obstructive sleep apnea    Does  wear  CPAP   Osteoporosis    Paresthesia    Dr. Everette Rank at [facility]   Pelvic floor dysfunction    pelvic floor dyssynergy   Plantar fasciitis 02/2011   R foot   Pneumonia    2012   PONV (postoperative nausea and vomiting)    pt states has gastroparesis has difficulty taking antibiotics and narcotics has severe nausea and vomiting    Restless leg syndrome    S/P endometrial ablation 08/09/2006   Novasure Ablation   S/P epidural steroid injection 09/20/2014   Dr.Ramos   Tremor    Dr. Everette Rank   Urinary frequency    Urinary incontinence     Past Medical History, Surgical history, Social history, and Family history were reviewed and updated as appropriate.   Please see review of systems for further details on the patient's review from today.   Objective:   Physical Exam:  LMP 07/27/2006   Physical Exam Constitutional:  General: She is not in acute distress. Musculoskeletal:        General: No deformity.  Neurological:     Mental Status: She is alert and oriented to person, place, and time.     Coordination: Coordination normal.  Psychiatric:        Attention and Perception: Attention and perception normal. She does not perceive auditory or visual hallucinations.        Mood and Affect: Mood normal. Mood is not anxious or depressed. Affect is not labile, blunt, angry or inappropriate.        Speech: Speech normal.        Behavior: Behavior normal.        Thought Content: Thought content normal. Thought content is not paranoid or delusional. Thought content does not include homicidal or suicidal ideation. Thought content does not include homicidal or suicidal plan.        Cognition and Memory: Cognition and memory normal.        Judgment: Judgment normal.     Comments: Insight intact     Lab Review:     Component Value Date/Time   NA 143 08/05/2022 0326   NA 141 02/05/2022 1204   K 4.0 08/05/2022 0326   CL 109 08/05/2022 0326   CO2 28 08/05/2022 0326   GLUCOSE 101 (H) 08/05/2022 0326   BUN 17 08/05/2022 0326   BUN 17 02/05/2022 1204   CREATININE 0.75 08/05/2022 0326   CREATININE 0.94 07/24/2022 1545   CALCIUM 8.8 (L) 08/05/2022 0326   PROT 6.4 (L) 07/31/2022 0317   PROT 6.5 02/05/2022 1204   ALBUMIN 3.3 (L) 07/31/2022 0317   ALBUMIN 4.0 02/05/2022 1204   AST 44 (H) 07/31/2022 0317   ALT 46 (H) 07/31/2022 0317   ALKPHOS 139 (H) 07/31/2022 0317   BILITOT 0.4 07/31/2022 0317   BILITOT <0.2 02/05/2022 1204   GFRNONAA >60 08/05/2022 0326   GFRNONAA 68 07/04/2020 1129   GFRAA 81 11/07/2020 1029   GFRAA 79 07/04/2020 1129       Component Value Date/Time   WBC 9.4 08/02/2022 0341   RBC 4.13 08/02/2022 0341   HGB 12.3 08/02/2022 0341   HGB 12.5 07/17/2022 1614   HGB 11.7 05/02/2011 1412   HCT 38.4 08/02/2022 0341   HCT 38.0 07/17/2022 1614   HCT 37.1 05/02/2011 1412   PLT 272  08/02/2022 0341   PLT 431 07/17/2022 1614   MCV 93.0 08/02/2022 0341   MCV 91 07/17/2022 1614   MCV 94.4 05/02/2011 1412   MCH 29.8 08/02/2022 0341   MCHC 32.0 08/02/2022 0341   RDW 14.5 08/02/2022 0341   RDW 13.2 07/17/2022 1614   RDW 18.3 (H) 05/02/2011 1412   LYMPHSABS 2.5 08/02/2022 0341   LYMPHSABS 3.2 (H) 07/17/2022 1614   LYMPHSABS 2.4 05/02/2011 1412   MONOABS 0.9 08/02/2022 0341   MONOABS 0.8 05/02/2011 1412   EOSABS 0.4 08/02/2022 0341   EOSABS 0.3 07/17/2022 1614   BASOSABS 0.1 08/02/2022 0341   BASOSABS 0.1 07/17/2022 1614   BASOSABS 0.1 05/02/2011 1412    No results found for: "POCLITH", "LITHIUM"   No results found for: "PHENYTOIN", "PHENOBARB", "VALPROATE", "CBMZ"   .res Assessment: Plan:    Plan:  1. Xanax 0.34m TID 2. Cymbalta 6370mBID 3. Abilify 70m29maily 4. Add Lunesta 2mg7mily  Melatonin PRN  RTC 6 months  Patient advised to contact office with any questions, adverse effects, or acute worsening in signs and symptoms.  Discussed potential metabolic  side effects associated with atypical antipsychotics, as well as potential risk for movement side effects. Advised pt to contact office if movement side effects occur.   There are no diagnoses linked to this encounter.   Please see After Visit Summary for patient specific instructions.  Future Appointments  Date Time Provider [place]  09/11/2022  1:45 PM Criselda Peaches, DPM [facility]  09/13/2022  1:15 PM Criselda Peaches, DPM [facility]  09/20/2022  4:15 PM Sherryle Lis, Stephan Minister, DPM [facility]  09/25/2022  9:30 AM Rosiland Oz, MD [facility]  12/10/2022  9:20 AM Ofilia Neas, PA-C [facility] None  01/31/2023  9:30 AM Rita Ohara, MD [facility]  08/22/2023  8:45 AM Rita Ohara, MD [facility]    No orders of the defined types were placed in this encounter.   -------------------------------

## 2022-09-12 ENCOUNTER — Other Ambulatory Visit: Payer: Self-pay | Admitting: Rheumatology

## 2022-09-12 NOTE — Telephone Encounter (Signed)
Next Visit: 12/10/2022   Last Visit: 06/06/2022   Last Fill: 03/08/2022  Dx: Trapezius muscle spasm   Current Dose per office note 06/06/2022: methocarbamol 500 mg 1 tablet by mouth daily as needed for muscle spasms  Okay to refill Methocarbamol?

## 2022-09-12 NOTE — Progress Notes (Signed)
  Subjective:  Patient ID: [name], female    DOB: 1956-01-06,  MRN: [id number]  Chief Complaint  Patient presents with   charcot's disease    Total contact cast got wet - needs to be changed    DOS: 08/03/2022 Procedure: Hardware removal and bone biopsies  66 y.o. female returns for post-op check.  Here for cast removal after it got wet  Review of Systems: Negative except as noted in the HPI. Denies N/V/F/Ch.   Objective:  There were no vitals filed for this visit. There is no height or weight on file to calculate BMI. Constitutional Well developed. Well nourished.  Vascular Foot warm and well perfused. Capillary refill normal to all digits.  Calf is soft and supple, no posterior calf or knee pain, negative Homans' sign  Neurologic Normal speech. Oriented to person, place, and time. Epicritic sensation to light touch grossly reduced bilaterally.  Dermatologic Incision remains well-healed and not hypertrophic.  No erythema.  Toe is still edematous.  No pain.  No heat.  Callus on posterior medial heel is stable.  Maceration of foot, no ulceration  The left great toe ulcer has a stable appearance there is no signs of infection there is a fully granular wound bed mild hyperkeratosis, limited to breakdown of skin  Orthopedic: No tenderness in the toe.  Moderate edema, somewhat improved since last visit.       Multiple view plain film radiographs: New films taken today and compared to last films show no significant increase in destructive changes, appears to be stable at this point  Blood culture with staph epidermidis, bone biopsy was negative Assessment:   1. Charcot's joint of right foot    Plan:  Patient was evaluated and treated and all questions answered.  S/p foot surgery right -Cast was removed today.  We will return to the CAM boot for 2 days and she will return on Thursday for cast application to allow the skin to resolve and the maceration to improve.  She may  bathe the foot regularly until then   Continue gentamicin ointment for left hallux   No follow-ups on file.

## 2022-09-13 ENCOUNTER — Ambulatory Visit (INDEPENDENT_AMBULATORY_CARE_PROVIDER_SITE_OTHER): Payer: Medicare Other | Admitting: Podiatry

## 2022-09-13 DIAGNOSIS — M14671 Charcot's joint, right ankle and foot: Secondary | ICD-10-CM

## 2022-09-13 DIAGNOSIS — L97521 Non-pressure chronic ulcer of other part of left foot limited to breakdown of skin: Secondary | ICD-10-CM

## 2022-09-14 NOTE — Progress Notes (Signed)
Subjective:  Patient ID: [name], female    DOB: 19-Dec-1955,  MRN: [id number]  Chief Complaint  Patient presents with   Routine Post Op    DOS 06/29/2022 FUSION OF JOINT IN BIG TOE RT FOOT. PER Pegge Cumberledge    DOS: 08/03/2022 Procedure: Hardware removal and bone biopsies  66 y.o. female returns for post-op check.  Here for cast reapplication to right foot and left toe ulcer  Review of Systems: Negative except as noted in the HPI. Denies N/V/F/Ch.   Objective:  There were no vitals filed for this visit. There is no height or weight on file to calculate BMI. Constitutional Well developed. Well nourished.  Vascular Foot warm and well perfused. Capillary refill normal to all digits.  Calf is soft and supple, no posterior calf or knee pain, negative Homans' sign  Neurologic Normal speech. Oriented to person, place, and time. Epicritic sensation to light touch grossly reduced bilaterally.  Dermatologic Toe incision is well-healed.  Medial heel hyperkeratosis is improved.  Erythema has resolved, there is no cellulitis  The left great toe ulcer has a stable appearance there is no signs of infection there is a fully granular wound bed mild hyperkeratosis, there is exposed subcutaneous tissues measure 0.7 x 0.7 x 0.2 cm  Orthopedic: No tenderness in the bilateral toe.  Moderate edema, somewhat improved since last visit.        Multiple view plain film radiographs: New films taken today and compared to last films show no significant increase in destructive changes, appears to be stable at this point  Blood culture with staph epidermidis, bone biopsy was negative Assessment:   1. Charcot's joint of right foot   2. Skin ulcer of left great toe, limited to breakdown of skin [facility])    Plan:  Patient was evaluated and treated and all questions answered.  S/p foot surgery right -IV antibiotics will be completed tomorrow.  She has her PICC line removed next week.  This will be her last  week of casting and then we will observe and transition back to a cam walker boot -WB Status: WBAT in total contact cast with walking boot, new cast was applied today -X-rays will be updated next week   Left ulcer is now with exposed subcutaneous tissue and fat layer.  I again debrided the surrounding hyperkeratosis and the wound bed of exudate and biofilm.  Fully granular.  Surrounding this in an excisional manner with a sharp scalpel.  Postdebridement measurements are noted above.  There are no signs of infection.  She will continue to change with Garamycin ointment   No follow-ups on file.

## 2022-09-20 ENCOUNTER — Ambulatory Visit (INDEPENDENT_AMBULATORY_CARE_PROVIDER_SITE_OTHER): Payer: Medicare Other | Admitting: Podiatry

## 2022-09-20 ENCOUNTER — Ambulatory Visit (INDEPENDENT_AMBULATORY_CARE_PROVIDER_SITE_OTHER): Payer: Medicare Other

## 2022-09-20 DIAGNOSIS — M109 Gout, unspecified: Secondary | ICD-10-CM | POA: Diagnosis not present

## 2022-09-20 DIAGNOSIS — L97521 Non-pressure chronic ulcer of other part of left foot limited to breakdown of skin: Secondary | ICD-10-CM

## 2022-09-20 DIAGNOSIS — M14671 Charcot's joint, right ankle and foot: Secondary | ICD-10-CM | POA: Diagnosis not present

## 2022-09-21 ENCOUNTER — Ambulatory Visit: Payer: Medicare Other | Admitting: Infectious Diseases

## 2022-09-24 ENCOUNTER — Telehealth: Payer: Self-pay | Admitting: Pharmacist

## 2022-09-24 NOTE — Telephone Encounter (Signed)
Received fax from [facility] that they are taking 2024 patient assistance renewal application. Patient receives FORTEO through patient assistance program.   Left VM for patient that application is mailed to her home. Patient portion mailed today.   Provider portion placed in Dr. Arlean Hopping folder to be signed  Submitted a Prior Authorization RENEWAL request to [facility] for [place] via CoverMyMeds. Will need to attach to PA approval to renewal application  Key; [id number]   Knox Saliva, PharmD, MPH, BCPS, CPP Clinical Pharmacist (Rheumatology and Pulmonology)

## 2022-09-24 NOTE — Progress Notes (Signed)
  Subjective:  Patient ID: [name], female    DOB: [birth year],  MRN: [id number]  Chief Complaint  Patient presents with   Routine Post Op    DOS: 08/03/2022 Procedure: Hardware removal and bone biopsies  66 y.o. female returns for post-op check.  Here for cast removal to right foot and left toe ulcer  Review of Systems: Negative except as noted in the HPI. Denies N/V/F/Ch.   Objective:  There were no vitals filed for this visit. There is no height or weight on file to calculate BMI. Constitutional Well developed. Well nourished.  Vascular Foot warm and well perfused. Capillary refill normal to all digits.  Calf is soft and supple, no posterior calf or knee pain, negative Homans' sign  Neurologic Normal speech. Oriented to person, place, and time. Epicritic sensation to light touch grossly reduced bilaterally.  Dermatologic Right hallux toe incision is well-healed.  Medial heel hyperkeratosis is improved.  Toe is edematous but there is no cellulitis or erythema  The left great toe ulcer has a stable appearance there is no signs of infection there is a fully granular wound bed mild hyperkeratosis, there is exposed subcutaneous tissues measure 0.5x0.5 x 0.2 cm.  Continues to gradually improve  Orthopedic: No tenderness in the bilateral toe.  Moderate edema, somewhat improved since last visit.        Multiple view plain film radiographs: New radiographs taken today show possible increase in density no increased lysis or destruction  Blood culture with staph epidermidis, bone biopsy was negative Assessment:   1. Charcot's joint of right foot   2. Skin ulcer of left great toe, limited to breakdown of skin [facility])    Plan:  Patient was evaluated and treated and all questions answered.  S/p foot surgery right -She has completed her antibiotics.  She sees infectious disease again next week.  Discussed with her I would like to reevaluate some of her lab work including an ESR.   We will wait until she sees Dr. [place] Bali so any lab work does not overlap.  I will see her back in 2 weeks for follow-up, now will be the critical time point to see if this deteriorates now that antibiotics have been finished.  Radiograph so far are showing no increasing destruction and possibly some increasing radiodensity-WB Status: She may begin WBAT in the cam boot -Return in 2 weeks for new x-rays and reevaluation   Left ulcer is now with exposed subcutaneous tissue and fat layer.  I again debrided the surrounding hyperkeratosis and the wound bed of exudate and biofilm.  Fully granular.  Surrounding this in an excisional manner with a sharp scalpel.  Postdebridement measurements are noted above.  There are no signs of infection.  She will continue to change with Garamycin ointment.  So far this is responding well to debridement and local wound care   No follow-ups on file.

## 2022-09-25 ENCOUNTER — Encounter: Payer: Self-pay | Admitting: Infectious Diseases

## 2022-09-25 ENCOUNTER — Other Ambulatory Visit: Payer: Self-pay

## 2022-09-25 ENCOUNTER — Ambulatory Visit: Payer: Medicare Other | Admitting: Infectious Diseases

## 2022-09-25 VITALS — BP 128/74 | HR 67 | Resp 16 | Ht 60.0 in | Wt 189.0 lb

## 2022-09-25 DIAGNOSIS — Z5181 Encounter for therapeutic drug level monitoring: Secondary | ICD-10-CM

## 2022-09-25 DIAGNOSIS — L97521 Non-pressure chronic ulcer of other part of left foot limited to breakdown of skin: Secondary | ICD-10-CM | POA: Diagnosis not present

## 2022-09-25 DIAGNOSIS — M14679 Charcot's joint, unspecified ankle and foot: Secondary | ICD-10-CM | POA: Diagnosis not present

## 2022-09-25 DIAGNOSIS — M869 Osteomyelitis, unspecified: Secondary | ICD-10-CM

## 2022-09-25 NOTE — Telephone Encounter (Signed)
Received notification from [facility] regarding a prior authorization for [place]. Authorization has been APPROVED from 09/24/22 to 11/26/2023.   Authorization # [id number]  Attached PA approval letter to PAP application. Signed provider portion received from Dr. Estanislado Pandy. Submission is pending return of patient portion  Knox Saliva, PharmD, MPH, BCPS, CPP Clinical Pharmacist (Rheumatology and Pulmonology)

## 2022-09-25 NOTE — Progress Notes (Signed)
Patient Active Problem List   Diagnosis Date Noted   PICC (peripherally inserted central catheter) in place 08/29/2022   Medication monitoring encounter 08/29/2022   History of Clostridioides difficile colitis    Infected orthopedic implant ([place])    Charcot's arthropathy of forefoot    Right foot pain 07/31/2022   Osteomyelitis of great toe of right foot ([place]) 07/30/2022   Bipolar disorder ([place]) 07/30/2022   Osteopenia 08/04/2021   Acute right-sided weakness 01/22/2021   Peripheral polyneuropathy 01/22/2021   History of migraine headaches 01/22/2021   Difficulty with speech    Gastric bypass status for obesity 09/29/2019   Aftercare 08/12/2018   Pain in left knee 06/03/2018   Dyspnea 12/26/2017   Restrictive lung disease secondary to obesity 12/26/2017   Atypical chest pain 12/06/2017   Sinus tachycardia 12/06/2017   Chest pain 12/06/2017   DJD (degenerative joint disease), cervical 12/24/2016   Primary osteoarthritis of both hips 12/24/2016   Primary osteoarthritis of both knees 12/24/2016   H/O total knee replacement, right 12/24/2016   Spondylosis of lumbar region without myelopathy or radiculopathy 12/24/2016   Lumbosacral spondylosis without myelopathy 12/24/2016   Acute gout 05/30/2016   Gout 05/30/2016   Myalgia 03/29/2016   Other long term (current) drug therapy 03/29/2016   Idiopathic peripheral neuropathy 03/29/2016   Cannot sleep 03/29/2016   Migraine without aura and responsive to treatment 03/29/2016   Multifocal myoclonus 03/29/2016   Restless leg 03/29/2016   Has a tremor 03/29/2016   History of aspiration pneumonitis 01/25/2016   History of acute bronchitis 01/25/2016   LPRD (laryngopharyngeal reflux disease) 01/25/2016   Imbalance 01/09/2016   Serotonin syndrome 12/22/2015   Essential hypertension 09/10/2015   OA (osteoarthritis) of knee 09/05/2015   Obesity 05/17/2015   OSA (obstructive sleep apnea) 03/16/2013   Iron deficiency 11/06/2012    Thrombocythemia 11/06/2012   Leukocytosis 11/05/2012   Impaired fasting glucose 07/23/2012   Bronchitis 04/03/2012   Kidney stone on left side 03/05/2012   Kidney cysts 03/04/2012   Loose stools 03/04/2012   Urinary frequency 12/04/2011   Restless leg syndrome 12/04/2011   Polypharmacy 12/04/2011   Bladder incontinence 12/04/2011   Fibromyalgia    Mixed hyperlipidemia    Edema    S/P endometrial ablation    Allergic rhinitis 09/18/2011   Depression, major, single episode, in partial remission ([place]) 04/21/2011   PRECORDIAL PAIN 02/17/2010   Anxiety state 01/30/2010   Migraine 01/30/2010   GERD without esophagitis 01/30/2010   Gastroparesis 01/30/2010   Current Outpatient Medications on File Prior to Visit  Medication Sig Dispense Refill   acetaminophen (TYLENOL) 325 MG tablet Take 2 tablets (650 mg total) by mouth every 4 (four) hours as needed for mild pain or moderate pain (or Fever >/= 101).     albuterol (VENTOLIN HFA) 108 (90 Base) MCG/ACT inhaler TAKE 2 PUFFS BY MOUTH EVERY 6 HOURS AS NEEDED FOR WHEEZE OR SHORTNESS OF BREATH (Patient taking differently: Inhale 2 puffs into the lungs every 6 (six) hours as needed for wheezing or shortness of breath.) 8.5 each 0   allopurinol (ZYLOPRIM) 300 MG tablet Take 1 tablet (300 mg total) by mouth daily. 90 tablet 3   ALPRAZolam (XANAX) 0.25 MG tablet TAKE 1 TABLET BY MOUTH 3 TIMES DAILY AS NEEDED FOR ANXIETY. 90 tablet 2   ARIPiprazole (ABILIFY) 5 MG tablet Take 1 tablet (5 mg total) by mouth daily. 90 tablet 3   aspirin EC 81 MG tablet Take 81 mg by  mouth in the morning. Swallow whole.     CALCIUM PO Take 500 mg by mouth in the morning, at noon, and at bedtime.     cetirizine (ZYRTEC) 10 MG tablet Take 10 mg by mouth at bedtime.     colchicine 0.6 MG tablet Take 1.38m (2 tablets) then 0.611m(1 tablet) 1 hour after. Then, take 1 tablet every day for 7 days. (Patient taking differently: Take 0.6-1.2 mg by mouth See admin instructions. Take  1.2 mg by mouth, then 0.6 mg one hour later as directed for gout flares. Take 0.6 mg once a day for 7 days thereafter.) 10 tablet 2   DULoxetine (CYMBALTA) 60 MG capsule Take 1 capsule (60 mg total) by mouth 2 (two) times daily. (Patient taking differently: Take 60 mg by mouth in the morning and at bedtime.) 180 capsule 3   estradiol (ESTRACE VAGINAL) 0.1 MG/GM vaginal cream Place 1 g vaginally 2 (two) times a week. 42.5 g 2   eszopiclone (LUNESTA) 2 MG TABS tablet Take 1 tablet (2 mg total) by mouth at bedtime as needed for sleep. Take immediately before bedtime 30 tablet 2   fluticasone (FLONASE) 50 MCG/ACT nasal spray PLACE 2 SPRAYS INTO BOTH NOSTRILS DAILY AS NEEDED FOR ALLERGIES (Patient taking differently: Place 2 sprays into both nostrils daily.) 16 mL 2   gabapentin (NEURONTIN) 300 MG capsule Take 300 mg by mouth 3 (three) times daily.     gentamicin cream (GARAMYCIN) 0.1 % Apply 1 Application topically in the morning and at bedtime. 30 g 0   Insulin Pen Needle (B-D UF III MINI PEN NEEDLES) 31G X 5 MM MISC USE TO INJECT FORTEO ONCE DAILY. DISCARD AFTER USE. 100 each 2   meclizine (ANTIVERT) 25 MG tablet Take 1 tablet (25 mg total) by mouth 3 (three) times daily as needed for dizziness. 30 tablet 0   melatonin 5 MG TABS Take 5 mg by mouth at bedtime.      methocarbamol (ROBAXIN) 500 MG tablet TAKE 1 TABLET BY MOUTH EVERY DAY AS NEEDED 30 tablet 2   metoprolol tartrate (LOPRESSOR) 50 MG tablet TAKE 1 TABLET BY MOUTH TWICE A DAY 180 tablet 1   Multiple Vitamins-Minerals (PRESERVISION AREDS 2) CAPS Take 1 capsule by mouth in the morning and at bedtime.     ondansetron (ZOFRAN) 8 MG tablet Take 8 mg by mouth every 8 (eight) hours as needed for nausea.  0   OXcarbazepine (TRILEPTAL) 150 MG tablet Take 150 mg by mouth in the morning and at bedtime.     pantoprazole (PROTONIX) 40 MG tablet Take 1 tablet (40 mg total) by mouth daily. (Patient taking differently: Take 40 mg by mouth at bedtime.) 90  tablet 0   PRESCRIPTION MEDICATION CPAP- At bedtime     Probiotic Product (PROBIOTIC ADVANCED PO) Take 1 capsule by mouth at bedtime.     promethazine (PHENERGAN) 25 MG tablet Take 25 mg by mouth every 6 (six) hours as needed for nausea or vomiting.     RESTASIS 0.05 % ophthalmic emulsion Place 1 drop into both eyes in the morning and at bedtime.     SYSTANE ULTRA PF 0.4-0.3 % SOLN Place 1 drop into both eyes 3 (three) times daily as needed (for dryness).     Teriparatide, Recombinant, (FORTEO) 600 MCG/2.4ML SOPN Inject 20 mcg into the skin daily. 7.2 mL 0   No current facility-administered medications on file prior to visit.    Subjective: Here for HFU for Hardware associated  Osteomyelitis/septic arthritis of rt great toe. Patient is accompanied by her husband. She has completed her IV abtx. She has been closely followed by Podiatry Dr Sherryle Lis. Last seen on 10/26 with well healed rt great toe incision and stable left great toe ulcer with no signs of infection. States she developed a small friction ulcer in the bottom surface of left great toe that started a month ago Denies fevers, chills. Denies nausea, vomiting, abdominal pain and diarrhea. Denies any pain/tendernesss swelling/drainage in the rt great toe as well as left great toe ulcer. Doing well with no concerns otherwise.   Review of Systems: all systems reviewed with pertinent positives and negatives as listed above  Past Medical History:  Diagnosis Date   Anemia    previously followed by Dr. Jamse Arn for anemia and elevated platelets   Anxiety    C. difficile colitis 10/01/2012   treated by [facility]   Chronic fatigue syndrome    Closed wedge compression fracture of T8 vertebra ([place]) 06/2021   DDD (degenerative disc disease), lumbar 08/19/2014   and facet arthroplasty & left lumbar radiculopathy (Dr.Ramos)   Depression    Dyssynergia    dyssynergenic defecation, contributing to fecal incontinence.   Edema    Fibromyalgia     Gastroparesis    [facility]   GERD (gastroesophageal reflux disease)    History of kidney stones    History of vertebral fracture 06/30/2021   Hyperlipidemia    Kidney stone    Lumbar radiculopathy    Migraine    Neuropathy    Obstructive sleep apnea    Does  wear  CPAP   Osteoporosis    Paresthesia    Dr. Everette Rank at [facility]   Pelvic floor dysfunction    pelvic floor dyssynergy   Plantar fasciitis 02/2011   R foot   Pneumonia    2012   PONV (postoperative nausea and vomiting)    pt states has gastroparesis has difficulty taking antibiotics and narcotics has severe nausea and vomiting    Restless leg syndrome    S/P endometrial ablation 08/09/2006   Novasure Ablation   S/P epidural steroid injection 09/20/2014   Dr.Ramos   Tremor    Dr. Everette Rank   Urinary frequency    Urinary incontinence    Past Surgical History:  Procedure Laterality Date   CHOLECYSTECTOMY  07/2004   ENDOMETRIAL ABLATION  08/09/2006   Dr. Valentina Shaggy Ablation   FACET JOINT INJECTION  04/17/2017   Left L4-5 and L5-S1   FOOT SURGERY Right 06/29/2022   FUSION OF JOINT IN BIG TOE RT FOOT, Dr. Sherryle Lis   GASTRIC ROUX-EN-Y N/A 09/29/2019   Procedure: LAPAROSCOPIC ROUX-EN-Y GASTRIC BYPASS WITH UPPER ENDOSCOPY, ERAS Pathway;  Surgeon: Johnathan Hausen, MD;  Location: [facility];  Service: General;  Laterality: N/A;   IRRIGATION AND DEBRIDEMENT FOOT Right 08/03/2022   Procedure: foot bone biopsy and hardware removal;  Surgeon: Criselda Peaches, DPM;  Location: [facility];  Service: Podiatry;  Laterality: Right;   KNEE ARTHROPLASTY     KNEE SURGERY  1999   R knee, Dr. Eddie Dibbles, torn cartilage   KYPHOPLASTY  11/06/2021   [id number]   RETINAL LASER PROCEDURE Right 08/28/2018   laser retinopexy   RIGHT/LEFT HEART CATH AND CORONARY ANGIOGRAPHY N/A 01/01/2018   Procedure: RIGHT/LEFT HEART CATH AND CORONARY ANGIOGRAPHY;  Surgeon: Sherren Mocha, MD;  Location: [facility];  Service:  Cardiovascular;  Laterality: N/A;   TONSILLECTOMY  1968   TONSILLECTOMY  TOTAL KNEE ARTHROPLASTY Right 09/05/2015   Procedure: RIGHT TOTAL KNEE ARTHROPLASTY;  Surgeon: Gaynelle Arabian, MD;  Location: [facility];  Service: Orthopedics;  Laterality: Right;   TOTAL KNEE ARTHROPLASTY Left 07/14/2018   Procedure: LEFT TOTAL KNEE ARTHROPLASTY;  Surgeon: Gaynelle Arabian, MD;  Location[facility];  Service: Orthopedics;  Laterality: Left;   ULTRASOUND GUIDANCE FOR VASCULAR ACCESS  01/01/2018   Procedure: Ultrasound Guidance For Vascular Access;  Surgeon: Sherren Mocha, MD;  Location: [facility];  Service: Cardiovascular;;   UMBILICAL HERNIA REPAIR N/A 09/29/2019   Procedure: HERNIA REPAIR UMBILICAL ADULT;  Surgeon: Johnathan Hausen, MD;  Location: [facility];  Service: General;  Laterality: N/A;     Social History   Tobacco Use   Smoking status: Never    Passive exposure: Never   Smokeless tobacco: Never  Vaping Use   Vaping Use: Never used  Substance Use Topics   Alcohol use: No    Alcohol/week: 0.0 standard drinks of alcohol   Drug use: No    Family History  Problem Relation Age of Onset   Allergies Mother    Hypertension Mother    Heart disease Mother        possible valve problem - leaking valve   Macular degeneration Mother    Macular degeneration Father    Heart disease Father        pacemaker, CHF   Hypertension Father    Diabetes Father        borderline   Stroke Father 24   Kidney disease Father    Allergies Sister    Asthma Sister    Irritable bowel syndrome Sister    Macular degeneration Sister    Heart disease Maternal Grandmother    Heart disease Maternal Grandfather    Heart disease Paternal Grandmother    Heart disease Paternal Grandfather    Cancer Maternal Aunt        leukemia   Cancer Maternal Aunt    Colon cancer Maternal Aunt        late 60's   CAD Neg Hx     Allergies  Allergen Reactions   Erythromycin Nausea Only and Other (See Comments)     Abdominal pain, also   Meperidine Hcl Other (See Comments)    Made the patient feel hot and her heart raced- made her feel "flushed," also   Polyethyl Glyc-Propyl Glyc Pf Nausea And Vomiting and Other (See Comments)    RN confirmed with patient 9/4 (Miralax)   Dilaudid [Hydromorphone Hcl] Itching    Health Maintenance  Topic Date Due   COVID-19 Vaccine (6 - Pfizer risk series) 04/02/2022   Medicare Annual Wellness (AWV)  06/28/2022   COLONOSCOPY (Pts 45-85yr Insurance coverage will need to be confirmed)  12/04/2022   MAMMOGRAM  12/25/2022   PAP SMEAR-Modifier  02/26/2025   TETANUS/TDAP  06/21/2027   Pneumonia Vaccine 65 Years old  Completed   INFLUENZA VACCINE  Completed   DEXA SCAN  Completed   Hepatitis C Screening  Completed   HIV Screening  Completed   Zoster Vaccines- Shingrix  Completed   HPV VACCINES  Aged Out    Objective: BP 128/74   Pulse 67   Resp 16   Ht 5' (1.524 m)   Wt 189 lb (85.7 kg)   LMP 07/27/2006   SpO2 97%   BMI 36.91 kg/m    Physical Exam Constitutional:      Appearance: Normal appearance. Obese  HENT:     Head:  Normocephalic and atraumatic.      Mouth: Mucous membranes are moist.  Eyes:    Conjunctiva/sclera: Conjunctivae normal.     Pupils:   Cardiovascular:     Rate and Rhythm: Normal rate and regular rhythm.     Heart sounds:  Pulmonary:     Effort: Pulmonary effort is normal.     Breath sounds: Normal breath sounds.   Abdominal:     General: Non distended     Palpations: soft.   Musculoskeletal:        General: Normal range of motion.   Rt great toe incision has healed with mild edema and no signs of infection. ROM of rt great toe is good.  Left great toe ulcer with pink granulation tissue, no signs of acute infection         Skin:    General: Skin is warm and dry.     Comments:   Neurological:     General: grossly non focal     Mental Status: awake, alert and oriented to person, place, and time.    Psychiatric:        Mood and Affect: Mood normal.   Lab Results Lab Results  Component Value Date   WBC 9.4 08/02/2022   HGB 12.3 08/02/2022   HCT 38.4 08/02/2022   MCV 93.0 08/02/2022   PLT 272 08/02/2022    Lab Results  Component Value Date   CREATININE 0.75 08/05/2022   BUN 17 08/05/2022   NA 143 08/05/2022   K 4.0 08/05/2022   CL 109 08/05/2022   CO2 28 08/05/2022    Lab Results  Component Value Date   ALT 46 (H) 07/31/2022   AST 44 (H) 07/31/2022   ALKPHOS 139 (H) 07/31/2022   BILITOT 0.4 07/31/2022    Lab Results  Component Value Date   CHOL 117 02/05/2022   HDL 48 02/05/2022   LDLCALC 45 02/05/2022   TRIG 137 02/05/2022   CHOLHDL 2.4 02/05/2022   No results found for: "LABRPR", "RPRTITER" No results found for: "HIV1RNAQUANT", "HIV1RNAVL", "CD4TABS"   Microbiology Results for orders placed or performed during the hospital encounter of 07/30/22  Culture, blood (Routine X 2) w Reflex to ID Panel     Status: None   Collection Time: 08/01/22  9:44 AM   Specimen: BLOOD LEFT ARM  Result Value Ref Range Status   Specimen Description   Final    BLOOD LEFT ARM Performed at [facility] [address] [place], [place] [address]    Special Requests   Final    BOTTLES DRAWN AEROBIC ONLY Blood Culture adequate volume Performed at [facility] [address], [place], [place] [address]    Culture   Final    NO GROWTH 5 DAYS Performed at [facility], [place] [address] [place], [place] [address]    Report Status 08/06/2022 FINAL  Final  Culture, blood (Routine X 2) w Reflex to ID Panel     Status: None   Collection Time: 08/01/22 10:28 AM   Specimen: BLOOD  Result Value Ref Range Status   Specimen Description   Final    BLOOD BLOOD LEFT HAND Performed at [facility] [address] [place], [place] [address]    Special Requests   Final    Blood Culture adequate volume AEROBIC BOTTLE  ONLY Performed at [facility] [address] [place], [place] [address]    Culture  Final    NO GROWTH 5 DAYS Performed at [facility], [place] [address], [place], [place] [address]    Report Status 08/06/2022 FINAL  Final  Surgical pcr screen     Status: None   Collection Time: 08/03/22  2:09 PM   Specimen: Nasal Mucosa; Nasal Swab  Result Value Ref Range Status   MRSA, PCR NEGATIVE NEGATIVE Final   Staphylococcus aureus NEGATIVE NEGATIVE Final    Comment: (NOTE) The Xpert SA Assay (FDA approved for NASAL specimens in patients 34 years of age and older), is one component of a comprehensive surveillance program. It is not intended to diagnose infection nor to guide or monitor treatment. Performed at [facility], [place] [address] [place], [place] [address]   Aerobic/Anaerobic Culture w Gram Stain (surgical/deep wound)     Status: None   Collection Time: 08/03/22  5:59 PM   Specimen: Wound; Body Fluid  Result Value Ref Range Status   Specimen Description   Final    WOUND Performed at [facility] [address], [place], [place] [address]    Special Requests   Final    RIGHT GREAT BIG TOE JOINT FLUID Performed at [facility] [address] [place], [place] [address]    Gram Stain NO WBC SEEN RARE GRAM POSITIVE COCCI   Final   Culture   Final    RARE STAPHYLOCOCCUS EPIDERMIDIS NO ANAEROBES ISOLATED Performed at [facility], [place] [address] [place], [place] [address]    Report Status 08/08/2022 FINAL  Final   Organism ID, Bacteria STAPHYLOCOCCUS EPIDERMIDIS  Final      Susceptibility   Staphylococcus epidermidis - MIC*    CIPROFLOXACIN 4 RESISTANT Resistant     ERYTHROMYCIN <=0.25 SENSITIVE Sensitive     GENTAMICIN 8 INTERMEDIATE Intermediate     OXACILLIN >=4 RESISTANT Resistant     TETRACYCLINE >=16 RESISTANT Resistant     VANCOMYCIN 2 SENSITIVE Sensitive     TRIMETH/SULFA  80 RESISTANT Resistant     CLINDAMYCIN <=0.25 SENSITIVE Sensitive     RIFAMPIN <=0.5 SENSITIVE Sensitive     Inducible Clindamycin NEGATIVE Sensitive     * RARE STAPHYLOCOCCUS EPIDERMIDIS  Aerobic/Anaerobic Culture w Gram Stain (surgical/deep wound)     Status: None   Collection Time: 08/03/22  6:07 PM   Specimen: Wound; Body Fluid  Result Value Ref Range Status   Specimen Description   Final    WOUND Performed at [facility] [address], [place], [place] [address]    Special Requests   Final    HARDWARE RIGHT GREAT BIG TOE Performed at [facility] [address] [place], [place] [address]    Gram Stain NO ORGANISMS SEEN NO WBC SEEN   Final   Culture   Final    RARE STAPHYLOCOCCUS EPIDERMIDIS NO ANAEROBES ISOLATED SEE SEPARATE REPORT Performed at [facility], [address], [place],  [address]    Report Status 08/13/2022 FINAL  Final   Organism ID, Bacteria STAPHYLOCOCCUS EPIDERMIDIS  Final      Susceptibility   Staphylococcus epidermidis - MIC*    CIPROFLOXACIN 4 RESISTANT Resistant     ERYTHROMYCIN <=0.25 SENSITIVE Sensitive     GENTAMICIN 8 INTERMEDIATE Intermediate     OXACILLIN >=4 RESISTANT Resistant     TETRACYCLINE >=16 RESISTANT Resistant     VANCOMYCIN 2 SENSITIVE Sensitive     TRIMETH/SULFA 80 RESISTANT Resistant  CLINDAMYCIN <=0.25 SENSITIVE Sensitive     RIFAMPIN <=0.5 SENSITIVE Sensitive     Inducible Clindamycin NEGATIVE Sensitive     * RARE STAPHYLOCOCCUS EPIDERMIDIS  Aerobic/Anaerobic Culture w Gram Stain (surgical/deep wound)     Status: None   Collection Time: 08/03/22  6:15 PM   Specimen: Wound  Result Value Ref Range Status   Specimen Description   Final    WOUND Performed at [facility] [address], [place], [place] [address]    Special Requests   Final    NONE DISTAL PHALANX BONE Performed at [facility] [address],  [place], [place] [address]    Gram Stain NO ORGANISMS SEEN NO WBC SEEN   Final   Culture   Final    No growth aerobically or anaerobically. Performed at [facility], [place] [address] [place], [place] [address]    Report Status 08/08/2022 FINAL  Final  Aerobic/Anaerobic Culture w Gram Stain (surgical/deep wound)     Status: None   Collection Time: 08/03/22  6:20 PM   Specimen: Wound  Result Value Ref Range Status   Specimen Description   Final    WOUND Performed at [facility] [address] [place], [place] [address]    Special Requests   Final    NONE PROXIMAL PHALANX BONE Performed at [facility], [place] [address], [place], [place] [address]    Gram Stain NO ORGANISMS SEEN NO WBC SEEN   Final   Culture   Final    RARE STAPHYLOCOCCUS EPIDERMIDIS SUSCEPTIBILITIES PERFORMED ON PREVIOUS CULTURE WITHIN THE LAST 5 DAYS. NO ANAEROBES ISOLATED Performed at [facility], [place] [address] [place], [place] [address]    Report Status 08/08/2022 FINAL  Final   *Note: Due to a large number of results and/or encounters for the requested time period, some results have not been displayed. A complete set of results can be found in Results Review.   Imaging DG Foot Complete Right  Result Date: 09/07/2022 Please see detailed radiograph report in office note.   Assessment/Plan 66 Y O female with multiple medical comorbidity including C diff colitis, polyarticular gout, bilateral feet numbness/neuropathy in feet unclear cause, OA bilateral hands, hips, s/p BL total knee arthroplasty, fibromyalgia, chronic fatigue syndrome, obesity, hx gastric bypass roux-en-Y with   # Hardware associated Osteomyelitis/septic arthritis of rt great toe, worsening on radiograph from 9/4 and concerning for osteomyelitis in NM scan 9/7 # Charcot's arthropathy of rt foot - s/p rt great toe arthrodesis for IP joint instability with 2 screws 06/29/22 - s/p I and D,  removal of deep implant and bone biopsy. Path negative for OM but multiple cultures including joint and bone + for MRSE - Also concerns for charcot foot per Podiatry Dr Sherryle Lis  Completed 6 weeks of IV daptomycin and IV ceftriaxone on 09/14/22 Overall clinically improving Monitor off antibiotics  May not need amputation if continues to heal off abtx  Will fu in a month or in case of any worsening symptoms  Fu with podiatry as instructed   # Left great toe plantar ulcer - superficial, no signs of infection - continue wound care  # Medication Monitoring  Discussed to start taking her prior dose of atorvastatin as she is off daptomycin  ESR and CRP today   # PICC  - has been removed   I have personally spent 45  minutes involved in face-to-face and non-face-to-face activities for  this patient on the day of the visit. Professional time spent includes the following activities: Preparing to see the patient (review of tests), Obtaining and/or reviewing separately obtained history (admission/discharge record), Performing a medically appropriate examination and/or evaluation , Ordering medications/tests/procedures, referring and communicating with other health care professionals, Documenting clinical information in the EMR, Independently interpreting results (not separately reported), Communicating results to the patient/family/caregiver, Counseling and educating the patient/family/caregiver and Care coordination (not separately reported).   Wilber Oliphant, [facility] 09/25/2022, 10:07 AM

## 2022-09-26 ENCOUNTER — Telehealth (INDEPENDENT_AMBULATORY_CARE_PROVIDER_SITE_OTHER): Payer: Medicare Other | Admitting: Family Medicine

## 2022-09-26 ENCOUNTER — Encounter: Payer: Self-pay | Admitting: Family Medicine

## 2022-09-26 ENCOUNTER — Telehealth: Payer: Self-pay | Admitting: Podiatry

## 2022-09-26 VITALS — BP 147/77 | HR 68 | Ht 60.0 in | Wt 185.0 lb

## 2022-09-26 DIAGNOSIS — J0100 Acute maxillary sinusitis, unspecified: Secondary | ICD-10-CM | POA: Diagnosis not present

## 2022-09-26 LAB — SEDIMENTATION RATE: Sed Rate: 39 mm/h — ABNORMAL HIGH (ref 0–30)

## 2022-09-26 LAB — C-REACTIVE PROTEIN: CRP: 8 mg/L — ABNORMAL HIGH (ref <8.0)

## 2022-09-26 MED ORDER — AZITHROMYCIN 250 MG PO TABS: ORAL_TABLET | ORAL | 0 refills | Status: DC

## 2022-09-26 NOTE — Progress Notes (Signed)
Start time: 12:17 End time: 12:36  Virtual Visit via Video Note  I connected with [name] on 09/26/22 by a video enabled telemedicine application and verified that I am speaking with the correct person using two identifiers.  Location: Patient: home Provider: office   I discussed the limitations of evaluation and management by telemedicine and the availability of in person appointments. The patient expressed understanding and agreed to proceed.  History of Present Illness:  Chief Complaint  Patient presents with   Nasal Congestion    VIRTUAL nasal congestion, coughing and HA. Symptoms started 2 weeks ago. Mucus is yellow/green in color. No fever, chills or body aches. No home covid tests.    She has been having runny nose, congestion and cough for the last 2 weeks.  Mucus had been clear until this past weekend.  Nasal drainage has been discolored for the last 3-4 days. It is greenish-yellow, all day long.  She is having some pain at the R cheek, and under her eye; this has been bothering her for the last 2 weeks. It had been coming and going, but more constant since Sunday.  This week she started coughing up some phlegm, also discolored. Cough isn't keeping her awake at night, not too bad.   Taking OTC alka selzer med containing--acetaminophen, dextromethorphan, phenylephrine. Taking this three times daily. Also taking Delsym twice daily.  Medications seemed to help some. Always feel worse in the morning, felt worse today.  Denies any sick contacts.   She finished course of IV antibiotics (Daptomycin) for her foot about 10 days ago.  PMH, PSH, SH reviewed  Outpatient Encounter Medications as of 09/26/2022  Medication Sig Note   acetaminophen (TYLENOL) 325 MG tablet Take 2 tablets (650 mg total) by mouth every 4 (four) hours as needed for mild pain or moderate pain (or Fever >/= 101). 09/26/2022: Took 2 at 7pm last night   allopurinol (ZYLOPRIM) 300 MG tablet Take 1 tablet  (300 mg total) by mouth daily.    ALPRAZolam (XANAX) 0.25 MG tablet TAKE 1 TABLET BY MOUTH 3 TIMES DAILY AS NEEDED FOR ANXIETY.    ARIPiprazole (ABILIFY) 5 MG tablet Take 1 tablet (5 mg total) by mouth daily.    aspirin EC 81 MG tablet Take 81 mg by mouth in the morning. Swallow whole.    CALCIUM PO Take 500 mg by mouth in the morning, at noon, and at bedtime.    cetirizine (ZYRTEC) 10 MG tablet Take 10 mg by mouth at bedtime.    Dextromethorphan HBr (DELSYM PO) Take 10 mLs by mouth as needed. 09/26/2022: Took at 8am   DULoxetine (CYMBALTA) 60 MG capsule Take 1 capsule (60 mg total) by mouth 2 (two) times daily. (Patient taking differently: Take 60 mg by mouth in the morning and at bedtime.)    estradiol (ESTRACE VAGINAL) 0.1 MG/GM vaginal cream Place 1 g vaginally 2 (two) times a week.    eszopiclone (LUNESTA) 2 MG TABS tablet Take 1 tablet (2 mg total) by mouth at bedtime as needed for sleep. Take immediately before bedtime    fluticasone (FLONASE) 50 MCG/ACT nasal spray PLACE 2 SPRAYS INTO BOTH NOSTRILS DAILY AS NEEDED FOR ALLERGIES (Patient taking differently: Place 2 sprays into both nostrils daily.)    gabapentin (NEURONTIN) 300 MG capsule Take 300 mg by mouth 3 (three) times daily.    gentamicin cream (GARAMYCIN) 0.1 % Apply 1 Application topically in the morning and at bedtime.    Insulin Pen Needle (  B-D UF III MINI PEN NEEDLES) 31G X 5 MM MISC USE TO INJECT FORTEO ONCE DAILY. DISCARD AFTER USE.    melatonin 5 MG TABS Take 5 mg by mouth at bedtime.  09/26/2022: Took last night   metoprolol tartrate (LOPRESSOR) 50 MG tablet TAKE 1 TABLET BY MOUTH TWICE A DAY    Multiple Vitamins-Minerals (PRESERVISION AREDS 2) CAPS Take 1 capsule by mouth in the morning and at bedtime.    OXcarbazepine (TRILEPTAL) 150 MG tablet Take 150 mg by mouth in the morning and at bedtime.    pantoprazole (PROTONIX) 40 MG tablet Take 1 tablet (40 mg total) by mouth daily. (Patient taking differently: Take 40 mg by mouth  at bedtime.)    Phenylephrine-Aspirin (ALKA-SELTZER PLUS SINUS PO) Take 2 capsules by mouth as needed. 09/26/2022: Took this am @ 8am, acetaminaphen,dextromethophan and phenylephrine     PRESCRIPTION MEDICATION CPAP- At bedtime    Probiotic Product (PROBIOTIC ADVANCED PO) Take 1 capsule by mouth at bedtime.    promethazine (PHENERGAN) 25 MG tablet Take 25 mg by mouth every 6 (six) hours as needed for nausea or vomiting. 09/26/2022: Took 6pm last night   RESTASIS 0.05 % ophthalmic emulsion Place 1 drop into both eyes in the morning and at bedtime.    SYSTANE ULTRA PF 0.4-0.3 % SOLN Place 1 drop into both eyes 3 (three) times daily as needed (for dryness).    Teriparatide, Recombinant, (FORTEO) 600 MCG/2.4ML SOPN Inject 20 mcg into the skin daily.    albuterol (VENTOLIN HFA) 108 (90 Base) MCG/ACT inhaler TAKE 2 PUFFS BY MOUTH EVERY 6 HOURS AS NEEDED FOR WHEEZE OR SHORTNESS OF BREATH (Patient not taking: Reported on 09/26/2022) 09/26/2022: prn   colchicine 0.6 MG tablet Take 1.'2mg'$  (2 tablets) then 0.'6mg'$  (1 tablet) 1 hour after. Then, take 1 tablet every day for 7 days. (Patient not taking: Reported on 09/26/2022) 09/26/2022: prn   meclizine (ANTIVERT) 25 MG tablet Take 1 tablet (25 mg total) by mouth 3 (three) times daily as needed for dizziness. (Patient not taking: Reported on 09/26/2022) 09/26/2022: prn   methocarbamol (ROBAXIN) 500 MG tablet TAKE 1 TABLET BY MOUTH EVERY DAY AS NEEDED (Patient not taking: Reported on 09/26/2022) 09/26/2022: prn   ondansetron (ZOFRAN) 8 MG tablet Take 8 mg by mouth every 8 (eight) hours as needed for nausea. (Patient not taking: Reported on 09/26/2022) 09/26/2022: prn   No facility-administered encounter medications on file as of 09/26/2022.   Allergies  Allergen Reactions   Erythromycin Nausea Only and Other (See Comments)    Abdominal pain, also   Meperidine Hcl Other (See Comments)    Made the patient feel hot and her heart raced- made her feel "flushed," also    Polyethyl Glyc-Propyl Glyc Pf Nausea And Vomiting and Other (See Comments)    RN confirmed with patient 9/4 (Miralax)   Dilaudid [Hydromorphone Hcl] Itching   ROS:  No fever, chills.  No significant worsening of n/v (at baseline).  Some diarrhea recenty, realized she didn't take her probiotic. Toe pain improved, still is big/swollen.    Observations/Objective:  BP (!) 147/77   Pulse 68   Ht 5' (1.524 m)   Wt 185 lb (83.9 kg)   LMP 07/27/2006   BMI 36.13 kg/m   Pleasant, well-appearing female in no distress She is alert and oriented, cranial nerves grossly intact, EOMI No throat clearing, sniffling or coughing during visit. She is speaking comfortably. Exam is limited due to the virtual nature of the visit.  Assessment and Plan:  Acute non-recurrent maxillary sinusitis - zpak, sinus rinses, mucinex; to stop Delsym--getting DM from BOTH OTC meds. May continue Alka Selzer med if BP/pulse ok - Plan: azithromycin (ZITHROMAX) 250 MG tablet  Zpak R maxillary sinus infection.   Follow Up Instructions:    I discussed the assessment and treatment plan with the patient. The patient was provided an opportunity to ask questions and all were answered. The patient agreed with the plan and demonstrated an understanding of the instructions.   The patient was advised to call back or seek an in-person evaluation if the symptoms worsen or if the condition fails to improve as anticipated.  I spent 21 minutes dedicated to the care of this patient, including pre-visit review of records, face to face time, post-visit ordering of testing and documentation.    Vikki Ports, MD

## 2022-09-26 NOTE — Patient Instructions (Addendum)
Stay well hydrated. Stop using the Delsym syrup, as the same ingredient is also in the [employer] medication that you are taking.  You may continue the alka selzer medication--which contains tylenol (acetaminophen), decongestant and dextromethorphan (the cough suppressant in Delsym).  The decongestant (phenylephrine) can raise blood pressure and pulse.  You should back down on this medication if you are having palpitations, heart racing, or if you blood pressure stays too high.  Consider a plain guaifenesin (mucinex, PLAIN) which is an expectorant that will loosen the mucus and phlegm. I also recommend restarting sinus rinses once or twice daily, until the cheek pain improves.  Take the antibiotics as directed. Remember that you only take them for 5 days, but they stay working for a full 10 days. Follow up if you are not improving.

## 2022-09-26 NOTE — Telephone Encounter (Signed)
Left message for pt to call to change appt time we are changing to the nurse schedule and need to change time.Marland Kitchen

## 2022-10-04 ENCOUNTER — Ambulatory Visit (INDEPENDENT_AMBULATORY_CARE_PROVIDER_SITE_OTHER): Payer: Medicare Other

## 2022-10-04 ENCOUNTER — Ambulatory Visit (INDEPENDENT_AMBULATORY_CARE_PROVIDER_SITE_OTHER): Payer: Medicare Other | Admitting: Podiatry

## 2022-10-04 DIAGNOSIS — M14671 Charcot's joint, right ankle and foot: Secondary | ICD-10-CM

## 2022-10-04 DIAGNOSIS — M869 Osteomyelitis, unspecified: Secondary | ICD-10-CM

## 2022-10-04 DIAGNOSIS — M7661 Achilles tendinitis, right leg: Secondary | ICD-10-CM

## 2022-10-04 NOTE — Progress Notes (Signed)
Subjective:  Patient ID: [name], female    DOB: [birth year],  MRN: [id number]  Chief Complaint  Patient presents with   Charcot    2wks for f/u/onDOS 06/29/2022 FUSION OF JOINT IN BIG TOE RT FOOT. PER Aaira Oestreicher    DOS: 08/03/2022 Procedure: Hardware removal and bone biopsies  66 y.o. female returns for post-op check.  She has been doing okay.  She has noted some increased swelling and random pain happening in the toe  Review of Systems: Negative except as noted in the HPI. Denies N/V/F/Ch.   Objective:  There were no vitals filed for this visit. There is no height or weight on file to calculate BMI. Constitutional Well developed. Well nourished.  Vascular Foot warm and well perfused. Capillary refill normal to all digits.  Calf is soft and supple, no posterior calf or knee pain, negative Homans' sign  Neurologic Normal speech. Oriented to person, place, and time. Epicritic sensation to light touch grossly reduced bilaterally.  Dermatologic Right hallux toe incision is well-healed.  Medial heel hyperkeratosis is nearly fully resolved.  Toe is edematous, there is no active cellulitis, no open drainage  The left great toe ulcer has a stable appearance there is no signs of infection there is a fully granular wound bed mild hyperkeratosis, there is exposed subcutaneous tissues measure 0.2x0.2 x 0.2 cm.  Continues to gradually improve  Orthopedic: No tenderness in the bilateral toe.  Moderate edema, somewhat improved since last visit.        Multiple view plain film radiographs: New radiographs taken today show stable position and appearance of the toe  Blood culture with staph epidermidis, bone biopsy was negative Assessment:   1. Charcot's joint of right foot   2. Achilles tendinitis, right leg   3. Osteomyelitis of great toe of right foot (HCC)    Plan:  Patient was evaluated and treated and all questions answered.  S/p foot surgery right -So far seems to be doing  okay.  She has noticed some increase edema and random pain here and there.  Seems to be inconsistent.  Clinically there does not seem to be any cellulitis.  I recommend that she begin to transition from the cam boot back to regular shoe gear and may resume all her normal activities including driving.  We will continue to monitor to see how she does and how she is improving.  I recommended rechecking her CBC ER Sarahn CRP in 4 weeks.  Rx was given to her for this.  I will see her shortly after that and we will reevaluate.  I discussed with her if this continues to worsen then we will proceed with amputation of the toe.   Left ulcer is nearly fully healed continue home dressing changes with gentamicin ointment.  No debridement necessary today  No follow-ups on file.

## 2022-10-04 NOTE — Patient Instructions (Signed)

## 2022-10-08 NOTE — Telephone Encounter (Signed)
Patient contacted the office requesting a call back to see if there is anything she needs to do on her end for the [employer].

## 2022-10-09 ENCOUNTER — Telehealth: Payer: Self-pay | Admitting: Podiatry

## 2022-10-09 MED ORDER — TRAMADOL HCL 50 MG PO TABS: 50.0000 mg | ORAL_TABLET | Freq: Three times a day (TID) | ORAL | 0 refills | Status: AC | PRN

## 2022-10-09 NOTE — Addendum Note (Signed)
Addended bySherryle Lis, Nikcole Eischeid R on: 10/09/2022 12:43 PM   Modules accepted: Orders

## 2022-10-09 NOTE — Telephone Encounter (Signed)
Pt is in a lot of pain and is requesting tramadol for pain.  [id number]- [place], [place] - [address]    Please advise

## 2022-10-09 NOTE — Telephone Encounter (Signed)
Returned call to patient. She has not received her portion of application that was mailed on 09/24/22. Remailed today. Advised her to complete and return to clinic asap  Knox Saliva, PharmD, MPH, BCPS, CPP Clinical Pharmacist (Rheumatology and Pulmonology)

## 2022-10-10 DIAGNOSIS — M79675 Pain in left toe(s): Secondary | ICD-10-CM | POA: Diagnosis not present

## 2022-10-10 DIAGNOSIS — M79674 Pain in right toe(s): Secondary | ICD-10-CM | POA: Diagnosis not present

## 2022-10-13 ENCOUNTER — Other Ambulatory Visit: Payer: Self-pay | Admitting: Physician Assistant

## 2022-10-13 DIAGNOSIS — M8000XA Age-related osteoporosis with current pathological fracture, unspecified site, initial encounter for fracture: Secondary | ICD-10-CM

## 2022-10-15 NOTE — Telephone Encounter (Signed)
Next Visit: 12/10/2022  Last Visit: 06/06/2022  Last Fill: 08/06/2022  DX: Age-related osteoporosis with current pathological fracture   Current Dose per office note on 06/06/2022: Forteo started 08/07/21.   Labs: 08/05/2022 BMP: glucose 101, calcium 8.8 08/02/2022 CBC   Okay to refill forteo?

## 2022-10-22 ENCOUNTER — Telehealth: Payer: Self-pay | Admitting: Rheumatology

## 2022-10-22 NOTE — Telephone Encounter (Signed)
Received completed patient portion.   Submitted Patient Assistance Application to [employer] for [place] along with provider portion, patient portion, PA, insurance cards, and medication list. Will update patient when we receive a response.  Fax# [phone] Phone# [phone]  [facility] [employer] [facility] PharmD Candidate [phone]

## 2022-10-22 NOTE — Telephone Encounter (Signed)
Thank you. Will f/u once response is received from [facility], PharmD, MPH, BCPS, CPP Clinical Pharmacist (Rheumatology and Pulmonology)

## 2022-10-22 NOTE — Telephone Encounter (Signed)
Patient stopped by the office to drop of paperwork for [facility]. Patient states she would like a call back if there is anything additional she needs to do.

## 2022-10-24 NOTE — Telephone Encounter (Signed)
Received fax from [facility] for [place] patient assistance, patient's application has been DENIED due to: exceeding financial criteria of program. Will need to appeal    Phone# [phone]  ATC patient regarding denial and need to move forward with reconsideraton process as we did last year including collection of income documents from 2023 for her and her husband. Left VM.  Knox Saliva, PharmD, MPH, BCPS, CPP Clinical Pharmacist (Rheumatology and Pulmonology)

## 2022-10-29 ENCOUNTER — Encounter: Payer: Medicare Other | Admitting: Podiatry

## 2022-10-29 NOTE — Telephone Encounter (Signed)
I spoke with patient regarding Forteo [employer] PAP denial. Advised her to prepare costs for 2023 out of pocket costs for both her and her husband. Advised her to call her insurance so I have a definite number to write on reconsideration letter.  Knox Saliva, PharmD, MPH, BCPS, CPP Clinical Pharmacist (Rheumatology and Pulmonology)

## 2022-10-30 ENCOUNTER — Ambulatory Visit: Payer: Medicare Other | Admitting: Infectious Diseases

## 2022-11-05 ENCOUNTER — Encounter: Payer: Self-pay | Admitting: Infectious Diseases

## 2022-11-05 ENCOUNTER — Ambulatory Visit: Payer: Medicare Other | Admitting: Infectious Diseases

## 2022-11-05 ENCOUNTER — Other Ambulatory Visit: Payer: Self-pay

## 2022-11-05 VITALS — BP 130/80 | HR 67 | Temp 97.7°F | Ht 62.0 in | Wt 190.0 lb

## 2022-11-05 DIAGNOSIS — Z5181 Encounter for therapeutic drug level monitoring: Secondary | ICD-10-CM

## 2022-11-05 DIAGNOSIS — M869 Osteomyelitis, unspecified: Secondary | ICD-10-CM

## 2022-11-05 NOTE — Progress Notes (Signed)
Patient Active Problem List   Diagnosis Date Noted   Skin ulcer of left great toe, limited to breakdown of skin ([employer]) 09/25/2022   PICC (peripherally inserted central catheter) in place 08/29/2022   Medication monitoring encounter 08/29/2022   History of Clostridioides difficile colitis    Infected orthopedic implant ([place])    Charcot's arthropathy of forefoot    Right foot pain 07/31/2022   Osteomyelitis of great toe of right foot ([place]) 07/30/2022   Bipolar disorder ([place]) 07/30/2022   Osteopenia 08/04/2021   Acute right-sided weakness 01/22/2021   Peripheral polyneuropathy 01/22/2021   History of migraine headaches 01/22/2021   Difficulty with speech    Gastric bypass status for obesity 09/29/2019   Aftercare 08/12/2018   Pain in left knee 06/03/2018   Dyspnea 12/26/2017   Restrictive lung disease secondary to obesity 12/26/2017   Atypical chest pain 12/06/2017   Sinus tachycardia 12/06/2017   Chest pain 12/06/2017   DJD (degenerative joint disease), cervical 12/24/2016   Primary osteoarthritis of both hips 12/24/2016   Primary osteoarthritis of both knees 12/24/2016   H/O total knee replacement, right 12/24/2016   Spondylosis of lumbar region without myelopathy or radiculopathy 12/24/2016   Lumbosacral spondylosis without myelopathy 12/24/2016   Acute gout 05/30/2016   Gout 05/30/2016   Myalgia 03/29/2016   Other long term (current) drug therapy 03/29/2016   Idiopathic peripheral neuropathy 03/29/2016   Cannot sleep 03/29/2016   Migraine without aura and responsive to treatment 03/29/2016   Multifocal myoclonus 03/29/2016   Restless leg 03/29/2016   Has a tremor 03/29/2016   History of aspiration pneumonitis 01/25/2016   History of acute bronchitis 01/25/2016   LPRD (laryngopharyngeal reflux disease) 01/25/2016   Imbalance 01/09/2016   Serotonin syndrome 12/22/2015   Essential hypertension 09/10/2015   OA (osteoarthritis) of knee 09/05/2015   Obesity 05/17/2015    OSA (obstructive sleep apnea) 03/16/2013   Iron deficiency 11/06/2012   Thrombocythemia 11/06/2012   Leukocytosis 11/05/2012   Impaired fasting glucose 07/23/2012   Bronchitis 04/03/2012   Kidney stone on left side 03/05/2012   Kidney cysts 03/04/2012   Loose stools 03/04/2012   Urinary frequency 12/04/2011   Restless leg syndrome 12/04/2011   Polypharmacy 12/04/2011   Bladder incontinence 12/04/2011   Fibromyalgia    Mixed hyperlipidemia    Edema    S/P endometrial ablation    Allergic rhinitis 09/18/2011   Depression, major, single episode, in partial remission ([place]) 04/21/2011   PRECORDIAL PAIN 02/17/2010   Anxiety state 01/30/2010   Migraine 01/30/2010   GERD without esophagitis 01/30/2010   Gastroparesis 01/30/2010   Current Outpatient Medications on File Prior to Visit  Medication Sig Dispense Refill   acetaminophen (TYLENOL) 325 MG tablet Take 2 tablets (650 mg total) by mouth every 4 (four) hours as needed for mild pain or moderate pain (or Fever >/= 101).     albuterol (VENTOLIN HFA) 108 (90 Base) MCG/ACT inhaler TAKE 2 PUFFS BY MOUTH EVERY 6 HOURS AS NEEDED FOR WHEEZE OR SHORTNESS OF BREATH (Patient not taking: Reported on 09/26/2022) 8.5 each 0   allopurinol (ZYLOPRIM) 300 MG tablet Take 1 tablet (300 mg total) by mouth daily. 90 tablet 3   ALPRAZolam (XANAX) 0.25 MG tablet TAKE 1 TABLET BY MOUTH 3 TIMES DAILY AS NEEDED FOR ANXIETY. 90 tablet 2   ARIPiprazole (ABILIFY) 5 MG tablet Take 1 tablet (5 mg total) by mouth daily. 90 tablet 3   aspirin EC 81 MG tablet Take 81 mg by  mouth in the morning. Swallow whole.     azithromycin (ZITHROMAX) 250 MG tablet Take 2 tablets by mouth on first day, then 1 tablet by mouth on days 2 through 5 6 tablet 0   CALCIUM PO Take 500 mg by mouth in the morning, at noon, and at bedtime.     cetirizine (ZYRTEC) 10 MG tablet Take 10 mg by mouth at bedtime.     colchicine 0.6 MG tablet Take 1.13m (2 tablets) then 0.672m(1 tablet) 1 hour  after. Then, take 1 tablet every day for 7 days. (Patient not taking: Reported on 09/26/2022) 10 tablet 2   Dextromethorphan HBr (DELSYM PO) Take 10 mLs by mouth as needed.     DULoxetine (CYMBALTA) 60 MG capsule Take 1 capsule (60 mg total) by mouth 2 (two) times daily. (Patient taking differently: Take 60 mg by mouth in the morning and at bedtime.) 180 capsule 3   estradiol (ESTRACE VAGINAL) 0.1 MG/GM vaginal cream Place 1 g vaginally 2 (two) times a week. 42.5 g 2   eszopiclone (LUNESTA) 2 MG TABS tablet Take 1 tablet (2 mg total) by mouth at bedtime as needed for sleep. Take immediately before bedtime 30 tablet 2   fluticasone (FLONASE) 50 MCG/ACT nasal spray PLACE 2 SPRAYS INTO BOTH NOSTRILS DAILY AS NEEDED FOR ALLERGIES (Patient taking differently: Place 2 sprays into both nostrils daily.) 16 mL 2   FORTEO 600 MCG/2.4ML SOPN INJECT 20MCG UNDER THE SKIN ONCE DAILY 7.2 mL 0   gabapentin (NEURONTIN) 300 MG capsule Take 300 mg by mouth 3 (three) times daily.     gentamicin cream (GARAMYCIN) 0.1 % Apply 1 Application topically in the morning and at bedtime. 30 g 0   Insulin Pen Needle (B-D UF III MINI PEN NEEDLES) 31G X 5 MM MISC USE TO INJECT FORTEO ONCE DAILY. DISCARD AFTER USE. 100 each 2   meclizine (ANTIVERT) 25 MG tablet Take 1 tablet (25 mg total) by mouth 3 (three) times daily as needed for dizziness. (Patient not taking: Reported on 09/26/2022) 30 tablet 0   melatonin 5 MG TABS Take 5 mg by mouth at bedtime.      methocarbamol (ROBAXIN) 500 MG tablet TAKE 1 TABLET BY MOUTH EVERY DAY AS NEEDED (Patient not taking: Reported on 09/26/2022) 30 tablet 2   metoprolol tartrate (LOPRESSOR) 50 MG tablet TAKE 1 TABLET BY MOUTH TWICE A DAY 180 tablet 1   Multiple Vitamins-Minerals (PRESERVISION AREDS 2) CAPS Take 1 capsule by mouth in the morning and at bedtime.     ondansetron (ZOFRAN) 8 MG tablet Take 8 mg by mouth every 8 (eight) hours as needed for nausea. (Patient not taking: Reported on 09/26/2022)   0   OXcarbazepine (TRILEPTAL) 150 MG tablet Take 150 mg by mouth in the morning and at bedtime.     pantoprazole (PROTONIX) 40 MG tablet Take 1 tablet (40 mg total) by mouth daily. (Patient taking differently: Take 40 mg by mouth at bedtime.) 90 tablet 0   Phenylephrine-Aspirin (ALKA-SELTZER PLUS SINUS PO) Take 2 capsules by mouth as needed.     PRESCRIPTION MEDICATION CPAP- At bedtime     Probiotic Product (PROBIOTIC ADVANCED PO) Take 1 capsule by mouth at bedtime.     promethazine (PHENERGAN) 25 MG tablet Take 25 mg by mouth every 6 (six) hours as needed for nausea or vomiting.     RESTASIS 0.05 % ophthalmic emulsion Place 1 drop into both eyes in the morning and at bedtime.  SYSTANE ULTRA PF 0.4-0.3 % SOLN Place 1 drop into both eyes 3 (three) times daily as needed (for dryness).     No current facility-administered medications on file prior to visit.    Subjective: Here for HFU for Hardware associated Osteomyelitis/septic arthritis of rt great toe. Patient is accompanied by her husband. She has completed her IV abtx on 10/20 and off abtx since then. After last seen, she saw Podiatry 11/9 with some increased edema but no concerns for infection developing. Plan  was to repeat ESR and CRP in 4 weeks, She has seen however Dr Lucia Gaskins for second opinion in November. I can't see their notes( have been requested today). Complains of mild swelling in rt great toe, feels right great toe is red sometimes but not consistent. She has shooting pain in her bilateral feet. Denies increased pain/tenderness/warmth in the rt great toe. She is able to walk without any difficulty. Denies fevers, chills and sweats. She has a fu with Dr Lucia Gaskins on 12/27.  Review of Systems: all systems reviewed with pertinent positives and negatives as listed above  Past Medical History:  Diagnosis Date   Anemia    previously followed by Dr. Jamse Arn for anemia and elevated platelets   Anxiety    C. difficile colitis 10/01/2012    treated by [facility]   Chronic fatigue syndrome    Closed wedge compression fracture of T8 vertebra ([place]) 06/2021   DDD (degenerative disc disease), lumbar 08/19/2014   and facet arthroplasty & left lumbar radiculopathy (Dr.Ramos)   Depression    Dyssynergia    dyssynergenic defecation, contributing to fecal incontinence.   Edema    Fibromyalgia    Gastroparesis    [facility]   GERD (gastroesophageal reflux disease)    History of kidney stones    History of vertebral fracture 06/30/2021   Hyperlipidemia    Kidney stone    Lumbar radiculopathy    Migraine    Neuropathy    Obstructive sleep apnea    Does  wear  CPAP   Osteoporosis    Paresthesia    Dr. Everette Rank at [facility]   Pelvic floor dysfunction    pelvic floor dyssynergy   Plantar fasciitis 02/2011   R foot   Pneumonia    2012   PONV (postoperative nausea and vomiting)    pt states has gastroparesis has difficulty taking antibiotics and narcotics has severe nausea and vomiting    Restless leg syndrome    S/P endometrial ablation 08/09/2006   Novasure Ablation   S/P epidural steroid injection 09/20/2014   Dr.Ramos   Tremor    Dr. Everette Rank   Urinary frequency    Urinary incontinence    Past Surgical History:  Procedure Laterality Date   CHOLECYSTECTOMY  07/2004   ENDOMETRIAL ABLATION  08/09/2006   Dr. Valentina Shaggy Ablation   FACET JOINT INJECTION  04/17/2017   Left L4-5 and L5-S1   FOOT SURGERY Right 06/29/2022   FUSION OF JOINT IN BIG TOE RT FOOT, Dr. Sherryle Lis   GASTRIC ROUX-EN-Y N/A 09/29/2019   Procedure: LAPAROSCOPIC ROUX-EN-Y GASTRIC BYPASS WITH UPPER ENDOSCOPY, ERAS Pathway;  Surgeon: Johnathan Hausen, MD;  Location: [facility];  Service: General;  Laterality: N/A;   IRRIGATION AND DEBRIDEMENT FOOT Right 08/03/2022   Procedure: foot bone biopsy and hardware removal;  Surgeon: Criselda Peaches, DPM;  Location: [facility];  Service: Podiatry;  Laterality: Right;   [place]  R knee, Dr. Eddie Dibbles, torn cartilage   KYPHOPLASTY  11/06/2021   T8-T9-Nundkumar   RETINAL LASER PROCEDURE Right 08/28/2018   laser retinopexy   RIGHT/LEFT HEART CATH AND CORONARY ANGIOGRAPHY N/A 01/01/2018   Procedure: RIGHT/LEFT HEART CATH AND CORONARY ANGIOGRAPHY;  Surgeon: Sherren Mocha, MD;  Location: [facility];  Service: Cardiovascular;  Laterality: N/A;   TONSILLECTOMY  1968   TONSILLECTOMY     TOTAL KNEE ARTHROPLASTY Right 09/05/2015   Procedure: RIGHT TOTAL KNEE ARTHROPLASTY;  Surgeon: Gaynelle Arabian, MD;  Location: [facility];  Service: Orthopedics;  Laterality: Right;   TOTAL KNEE ARTHROPLASTY Left 07/14/2018   Procedure: LEFT TOTAL KNEE ARTHROPLASTY;  Surgeon: Gaynelle Arabian, MD;  Location[facility];  Service: Orthopedics;  Laterality: Left;   ULTRASOUND GUIDANCE FOR VASCULAR ACCESS  01/01/2018   Procedure: Ultrasound Guidance For Vascular Access;  Surgeon: Sherren Mocha, MD;  Location: [facility];  Service: Cardiovascular;;   UMBILICAL HERNIA REPAIR N/A 09/29/2019   Procedure: HERNIA REPAIR UMBILICAL ADULT;  Surgeon: Johnathan Hausen, MD;  Location: [facility];  Service: General;  Laterality: N/A;     Social History   Tobacco Use   Smoking status: Never    Passive exposure: Never   Smokeless tobacco: Never  Vaping Use   Vaping Use: Never used  Substance Use Topics   Alcohol use: No    Alcohol/week: 0.0 standard drinks of alcohol   Drug use: No    Family History  Problem Relation Age of Onset   Allergies Mother    Hypertension Mother    Heart disease Mother        possible valve problem - leaking valve   Macular degeneration Mother    Macular degeneration Father    Heart disease Father        pacemaker, CHF   Hypertension Father    Diabetes Father        borderline   Stroke Father 71   Kidney disease Father    Allergies Sister    Asthma Sister    Irritable bowel syndrome Sister    Macular degeneration Sister    Heart disease Maternal  Grandmother    Heart disease Maternal Grandfather    Heart disease Paternal Grandmother    Heart disease Paternal Grandfather    Cancer Maternal Aunt        leukemia   Cancer Maternal Aunt    Colon cancer Maternal Aunt        late 60's   CAD Neg Hx     Allergies  Allergen Reactions   Erythromycin Nausea Only and Other (See Comments)    Abdominal pain, also   Meperidine Hcl Other (See Comments)    Made the patient feel hot and her heart raced- made her feel "flushed," also   Polyethyl Glyc-Propyl Glyc Pf Nausea And Vomiting and Other (See Comments)    RN confirmed with patient 9/4 (Miralax)   Dilaudid [Hydromorphone Hcl] Itching    Health Maintenance  Topic Date Due   Medicare Annual Wellness (AWV)  06/28/2022   COVID-19 Vaccine (6 - 2023-24 season) 07/27/2022   COLONOSCOPY (Pts 45-56yr Insurance coverage will need to be confirmed)  12/04/2022   MAMMOGRAM  12/25/2022   PAP SMEAR-Modifier  02/26/2025   DTaP/Tdap/Td (3 - Td or Tdap) 06/21/2027   Pneumonia Vaccine 65 Years old  Completed   INFLUENZA VACCINE  Completed   DEXA SCAN  Completed   Hepatitis C Screening  Completed   HIV Screening  Completed  Zoster Vaccines- Shingrix  Completed   HPV VACCINES  Aged Out    Objective: Ht _0  (1.575 m)   Wt 190 lb (86.2 kg)   LMP 07/27/2006   BMI 34.75 kg/m    Physical Exam Constitutional:      Appearance: Normal appearance. Obese  HENT:     Head: Normocephalic and atraumatic.      Mouth: Mucous membranes are moist.  Eyes:    Conjunctiva/sclera: Conjunctivae normal.     Pupils:   Cardiovascular:     Rate and Rhythm: Normal rate and regular rhythm.     Heart sounds:  Pulmonary:     Effort: Pulmonary effort is normal.     Breath sounds: Normal breath sounds.   Abdominal:     General: Non distended     Palpations: soft.   Musculoskeletal:        General: Normal range of motion.   Rt great toe incision has healed with mild edema and no signs of infection.  No warmth/tenderness/redness. ROM of rt great toe is good.  Left great toe ulcer, no signs of acute infection   Skin:    General: Skin is warm and dry.     Comments:   Neurological:     General: grossly non focal     Mental Status: awake, alert and oriented to person, place, and time.   Psychiatric:        Mood and Affect: Mood normal.   Lab Results Lab Results  Component Value Date   WBC 9.4 08/02/2022   HGB 12.3 08/02/2022   HCT 38.4 08/02/2022   MCV 93.0 08/02/2022   PLT 272 08/02/2022    Lab Results  Component Value Date   CREATININE 0.75 08/05/2022   BUN 17 08/05/2022   NA 143 08/05/2022   K 4.0 08/05/2022   CL 109 08/05/2022   CO2 28 08/05/2022    Lab Results  Component Value Date   ALT 46 (H) 07/31/2022   AST 44 (H) 07/31/2022   ALKPHOS 139 (H) 07/31/2022   BILITOT 0.4 07/31/2022    Lab Results  Component Value Date   CHOL 117 02/05/2022   HDL 48 02/05/2022   LDLCALC 45 02/05/2022   TRIG 137 02/05/2022   CHOLHDL 2.4 02/05/2022   No results found for: "LABRPR", "RPRTITER" No results found for: "HIV1RNAQUANT", "HIV1RNAVL", "CD4TABS"   Microbiology Results for orders placed or performed during the hospital encounter of 07/30/22  Culture, blood (Routine X 2) w Reflex to ID Panel     Status: None   Collection Time: 08/01/22  9:44 AM   Specimen: BLOOD LEFT ARM  Result Value Ref Range Status   Specimen Description   Final    BLOOD LEFT ARM Performed at [facility] [address], [place], [place] [address]    Special Requests   Final    BOTTLES DRAWN AEROBIC ONLY Blood Culture adequate volume Performed at [facility] [address], [place], [place] [address]    Culture   Final    NO GROWTH 5 DAYS Performed at [facility], [place] [address] [place], [place] [address]    Report Status 08/06/2022 FINAL  Final  Culture, blood (Routine X 2) w Reflex to ID Panel     Status: None   Collection Time:  08/01/22 10:28 AM   Specimen: BLOOD  Result Value Ref Range Status   Specimen Description   Final    BLOOD BLOOD LEFT  HAND Performed at [facility], [place] [address], [place], [place] [address]    Special Requests   Final    Blood Culture adequate volume AEROBIC BOTTLE ONLY Performed at [facility] [address] [place], [place] [address]    Culture   Final    NO GROWTH 5 DAYS Performed at [facility], [place] [address] [place], [place] [address]    Report Status 08/06/2022 FINAL  Final  Surgical pcr screen     Status: None   Collection Time: 08/03/22  2:09 PM   Specimen: Nasal Mucosa; Nasal Swab  Result Value Ref Range Status   MRSA, PCR NEGATIVE NEGATIVE Final   Staphylococcus aureus NEGATIVE NEGATIVE Final    Comment: (NOTE) The Xpert SA Assay (FDA approved for NASAL specimens in patients 45 years of age and older), is one component of a comprehensive surveillance program. It is not intended to diagnose infection nor to guide or monitor treatment. Performed at [facility], [place] [address], [place], [place] [address]   Aerobic/Anaerobic Culture w Gram Stain (surgical/deep wound)     Status: None   Collection Time: 08/03/22  5:59 PM   Specimen: Wound; Body Fluid  Result Value Ref Range Status   Specimen Description   Final    WOUND Performed at [facility] [address], [employer] [place], [place] [address]    Special Requests   Final    RIGHT GREAT BIG TOE JOINT FLUID Performed at [facility] [address] [place], [place] [address]    Gram Stain NO WBC SEEN RARE GRAM POSITIVE COCCI   Final   Culture   Final    RARE STAPHYLOCOCCUS EPIDERMIDIS NO ANAEROBES ISOLATED Performed at [facility], [place] [address] [place], [place] [address]    Report Status 08/08/2022 FINAL  Final   Organism ID, Bacteria STAPHYLOCOCCUS EPIDERMIDIS  Final      Susceptibility    Staphylococcus epidermidis - MIC*    CIPROFLOXACIN 4 RESISTANT Resistant     ERYTHROMYCIN <=0.25 SENSITIVE Sensitive     GENTAMICIN 8 INTERMEDIATE Intermediate     OXACILLIN >=4 RESISTANT Resistant     TETRACYCLINE >=16 RESISTANT Resistant     VANCOMYCIN 2 SENSITIVE Sensitive     TRIMETH/SULFA 80 RESISTANT Resistant     CLINDAMYCIN <=0.25 SENSITIVE Sensitive     RIFAMPIN <=0.5 SENSITIVE Sensitive     Inducible Clindamycin NEGATIVE Sensitive     * RARE STAPHYLOCOCCUS EPIDERMIDIS  Aerobic/Anaerobic Culture w Gram Stain (surgical/deep wound)     Status: None   Collection Time: 08/03/22  6:07 PM   Specimen: Wound; Body Fluid  Result Value Ref Range Status   Specimen Description   Final    WOUND Performed at [facility] [address], [place], [place] [address]    Special Requests   Final    HARDWARE RIGHT GREAT BIG TOE Performed at [facility] [address], [place], [place] [address]    Gram Stain NO ORGANISMS SEEN NO WBC SEEN   Final   Culture   Final    RARE STAPHYLOCOCCUS EPIDERMIDIS NO ANAEROBES ISOLATED SEE SEPARATE REPORT Performed at [facility], [address] [place],  [address]    Report Status 08/13/2022 FINAL  Final   Organism ID, Bacteria STAPHYLOCOCCUS EPIDERMIDIS  Final      Susceptibility   Staphylococcus epidermidis - MIC*    CIPROFLOXACIN 4 RESISTANT  Resistant     ERYTHROMYCIN <=0.25 SENSITIVE Sensitive     GENTAMICIN 8 INTERMEDIATE Intermediate     OXACILLIN >=4 RESISTANT Resistant     TETRACYCLINE >=16 RESISTANT Resistant     VANCOMYCIN 2 SENSITIVE Sensitive     TRIMETH/SULFA 80 RESISTANT Resistant     CLINDAMYCIN <=0.25 SENSITIVE Sensitive     RIFAMPIN <=0.5 SENSITIVE Sensitive     Inducible Clindamycin NEGATIVE Sensitive     * RARE STAPHYLOCOCCUS EPIDERMIDIS  Aerobic/Anaerobic Culture w Gram Stain (surgical/deep wound)     Status: None   Collection Time: 08/03/22  6:15 PM   Specimen: Wound   Result Value Ref Range Status   Specimen Description   Final    WOUND Performed at [facility] [address], [place], [place] [address]    Special Requests   Final    NONE DISTAL PHALANX BONE Performed at [facility] [address], [place], [place] [address]    Gram Stain NO ORGANISMS SEEN NO WBC SEEN   Final   Culture   Final    No growth aerobically or anaerobically. Performed at [facility], [place] [address] [place], [place] [address]    Report Status 08/08/2022 FINAL  Final  Aerobic/Anaerobic Culture w Gram Stain (surgical/deep wound)     Status: None   Collection Time: 08/03/22  6:20 PM   Specimen: Wound  Result Value Ref Range Status   Specimen Description   Final    WOUND Performed at [facility] [address] [place], [place] [address]    Special Requests   Final    NONE PROXIMAL PHALANX BONE Performed at [facility], [place] [address] [place], [place] [address]    Gram Stain NO ORGANISMS SEEN NO WBC SEEN   Final   Culture   Final    RARE STAPHYLOCOCCUS EPIDERMIDIS SUSCEPTIBILITIES PERFORMED ON PREVIOUS CULTURE WITHIN THE LAST 5 DAYS. NO ANAEROBES ISOLATED Performed at [facility], [place] [address] [place], [place] [address]    Report Status 08/08/2022 FINAL  Final   *Note: Due to a large number of results and/or encounters for the requested time period, some results have not been displayed. A complete set of results can be found in Results Review.   Imaging No results found.  Assessment/Plan 66 Y O female with multiple medical comorbidity including C diff colitis, polyarticular gout, bilateral feet numbness/neuropathy in feet unclear cause, OA bilateral hands, hips, s/p BL total knee arthroplasty, fibromyalgia, chronic fatigue syndrome, obesity, hx gastric bypass roux-en-Y with   # Hardware associated Osteomyelitis/septic arthritis of rt great toe, worsening on  radiograph from 9/4 and concerning for osteomyelitis in [place] scan 9/7 # Charcot's arthropathy of rt foot - s/p rt great toe arthrodesis for IP joint instability with 2 screws 06/29/22 - s/p I and D, removal of deep implant and bone biopsy. Path negative for OM but multiple cultures including joint and bone + for MRSE - Also concerns for charcot foot per Podiatry Dr Sherryle Lis  Completed 6 weeks of IV daptomycin and IV ceftriaxone on 09/14/22 Overall clinically improving. Monitor off antibiotics  May not need amputation if continues to heal off abtx  Will check ESR and CRP for trend but do not expect will need abtx  Fu with orthopedics, will get last office note from Dr Lucia Gaskins.  Fu with [place] as needed or with symptoms and signs of clinical worsening. Patient is agreeable to the plan  #  Left great toe plantar ulcer - superficial, no signs of infection - continue wound care  # Pedal paresthesia/peripheral polyneuropathy - follows Neurology   I have personally spent 35  minutes involved in face-to-face and non-face-to-face activities for this patient on the day of the visit. Professional time spent includes the following activities: Preparing to see the patient (review of tests), Obtaining and/or reviewing separately obtained history (admission/discharge record), Performing a medically appropriate examination and/or evaluation , Ordering medications/tests/procedures, referring and communicating with other health care professionals, Documenting clinical information in the EMR, Independently interpreting results (not separately reported), Communicating results to the patient/family/caregiver, Counseling and educating the patient/family/caregiver and Care coordination (not separately reported).   Wilber Oliphant, [place] [facility] 11/05/2022, 9:32 AM

## 2022-11-05 NOTE — Telephone Encounter (Signed)
Patient dropped off documents for appeal with 2023 bills  Knox Saliva, PharmD, MPH, BCPS, CPP Clinical Pharmacist (Rheumatology and Pulmonology)

## 2022-11-06 ENCOUNTER — Telehealth: Payer: Self-pay

## 2022-11-06 LAB — SEDIMENTATION RATE: Sed Rate: 33 mm/h — ABNORMAL HIGH (ref 0–30)

## 2022-11-06 LAB — C-REACTIVE PROTEIN: CRP: 3.1 mg/L (ref <8.0)

## 2022-11-06 NOTE — Telephone Encounter (Signed)
-----   Message from Rosiland Oz, MD sent at 11/06/2022 10:44 AM EST ----- Please let her know her inflammatory markers are reassuring. Recommend to fu with Ortho as planned.

## 2022-11-06 NOTE — Telephone Encounter (Signed)
Patient aware of results and to follow up with ortho.   [place], CMA

## 2022-11-14 ENCOUNTER — Other Ambulatory Visit (HOSPITAL_COMMUNITY): Payer: Self-pay

## 2022-11-14 NOTE — Telephone Encounter (Signed)
Appeal submitted to [facility] for [place]  Fax: [phone] Phone# [phone] [id number]  [place]ox Saliva, PharmD, MPH, BCPS, CPP Clinical Pharmacist (Rheumatology and Pulmonology)

## 2022-11-21 DIAGNOSIS — M79674 Pain in right toe(s): Secondary | ICD-10-CM | POA: Diagnosis not present

## 2022-11-29 NOTE — Progress Notes (Signed)
Office Visit Note  Patient: [name]             Date of Birth: [birth year]           MRN: [id number]             PCP: Rita Ohara, MD Referring: Rita Ohara, MD Visit Date: 12/10/2022 Occupation: '@GUAROCC'$ @  Subjective:  Fatigue   History of Present Illness: [name] is a 67 y.o. female with history of gout and osteoarthritis.  She remains on allopurinol 100 mg daily and colchicine 0.6 mg PRN during gout flares.  She denies any signs or symptoms of a gout flare.  She remains on Forteo daily injections for management of osteoporosis.  She is due to updated bone density. She experiences intermittent arthralgias and joint stiffness but denies any joint swelling.  She remains on Cymbalta as prescribed.  She continues to have significant fatigue as well as insomnia.  She has been taking Lunesta at bedtime to help her sleep at night but continues to have interrupted sleep at night.  She has tried tapering off of armodafinil but has been experiencing significant fatigue on a daily basis.  Her quality of life has been suffering due to her daytime drowsiness and fatigue.  She has been very sedentary throughout the day since discontinuing armodafinil.  She is aware of the risks of taking armodafinil but would like to retry taking it PRN if possible.      Activities of Daily Living:  Patient reports morning stiffness for 0 minutes  Patient Denies nocturnal pain.  Difficulty dressing/grooming: Denies Difficulty climbing stairs: Reports Difficulty getting out of chair: Denies Difficulty using hands for taps, buttons, cutlery, and/or writing: Denies  Review of Systems  Constitutional:  Positive for fatigue.  HENT:  Positive for mouth dryness. Negative for mouth sores.   Eyes:  Positive for dryness.  Respiratory:  Positive for shortness of breath.   Cardiovascular:  Negative for chest pain and palpitations.  Gastrointestinal:  Positive for diarrhea. Negative for blood in stool and  constipation.  Endocrine: Positive for increased urination.  Genitourinary:  Positive for involuntary urination.  Musculoskeletal:  Positive for gait problem and joint swelling. Negative for joint pain, joint pain, myalgias, muscle weakness, morning stiffness, muscle tenderness and myalgias.  Skin:  Negative for color change, rash, hair loss and sensitivity to sunlight.  Allergic/Immunologic: Negative for susceptible to infections.  Neurological:  Positive for headaches. Negative for dizziness.  Hematological:  Negative for swollen glands.  Psychiatric/Behavioral:  Positive for sleep disturbance. Negative for depressed mood. The patient is nervous/anxious.     PMFS History:  Patient Active Problem List   Diagnosis Date Noted   Skin ulcer of left great toe, limited to breakdown of skin ([place]) 09/25/2022   Medication monitoring encounter 08/29/2022   History of Clostridioides difficile colitis    Infected orthopedic implant ([place])    Charcot's arthropathy of forefoot    Right foot pain 07/31/2022   Osteomyelitis of great toe of right foot ([place]) 07/30/2022   Bipolar disorder ([place]) 07/30/2022   Osteopenia 08/04/2021   Acute right-sided weakness 01/22/2021   Peripheral polyneuropathy 01/22/2021   History of migraine headaches 01/22/2021   Difficulty with speech    Gastric bypass status for obesity 09/29/2019   Aftercare 08/12/2018   Pain in left knee 06/03/2018   Dyspnea 12/26/2017   Restrictive lung disease secondary to obesity 12/26/2017   Atypical chest pain 12/06/2017   Sinus tachycardia 12/06/2017  Chest pain 12/06/2017   DJD (degenerative joint disease), cervical 12/24/2016   Primary osteoarthritis of both hips 12/24/2016   Primary osteoarthritis of both knees 12/24/2016   H/O total knee replacement, right 12/24/2016   Spondylosis of lumbar region without myelopathy or radiculopathy 12/24/2016   Lumbosacral spondylosis without myelopathy 12/24/2016   Acute gout 05/30/2016    Gout 05/30/2016   Myalgia 03/29/2016   Other long term (current) drug therapy 03/29/2016   Idiopathic peripheral neuropathy 03/29/2016   Cannot sleep 03/29/2016   Migraine without aura and responsive to treatment 03/29/2016   Multifocal myoclonus 03/29/2016   Restless leg 03/29/2016   Has a tremor 03/29/2016   History of aspiration pneumonitis 01/25/2016   History of acute bronchitis 01/25/2016   LPRD (laryngopharyngeal reflux disease) 01/25/2016   Imbalance 01/09/2016   Serotonin syndrome 12/22/2015   Essential hypertension 09/10/2015   OA (osteoarthritis) of knee 09/05/2015   Obesity 05/17/2015   OSA (obstructive sleep apnea) 03/16/2013   Iron deficiency 11/06/2012   Thrombocythemia 11/06/2012   Leukocytosis 11/05/2012   Impaired fasting glucose 07/23/2012   Bronchitis 04/03/2012   Kidney stone on left side 03/05/2012   Kidney cysts 03/04/2012   Loose stools 03/04/2012   Urinary frequency 12/04/2011   Restless leg syndrome 12/04/2011   Polypharmacy 12/04/2011   Bladder incontinence 12/04/2011   Fibromyalgia    Mixed hyperlipidemia    Edema    S/P endometrial ablation    Allergic rhinitis 09/18/2011   Depression, major, single episode, in partial remission ([place]) 04/21/2011   PRECORDIAL PAIN 02/17/2010   Anxiety state 01/30/2010   Migraine 01/30/2010   GERD without esophagitis 01/30/2010   Gastroparesis 01/30/2010    Past Medical History:  Diagnosis Date   Anemia    previously followed by Dr. Jamse Arn for anemia and elevated platelets   Anxiety    C. difficile colitis 10/01/2012   treated by [facility]   Chronic fatigue syndrome    Closed wedge compression fracture of T8 vertebra ([place]) 06/2021   DDD (degenerative disc disease), lumbar 08/19/2014   and facet arthroplasty & left lumbar radiculopathy (Dr.Ramos)   Depression    Dyssynergia    dyssynergenic defecation, contributing to fecal incontinence.   Edema    Fibromyalgia    Gastroparesis    followed at [facility]    GERD (gastroesophageal reflux disease)    History of kidney stones    History of vertebral fracture 06/30/2021   Hyperlipidemia    Kidney stone    Lumbar radiculopathy    Migraine    Neuropathy    Obstructive sleep apnea    Does  wear  CPAP   Osteoporosis    Paresthesia    Dr. Everette Rank at [facility]   Pelvic floor dysfunction    pelvic floor dyssynergy   Plantar fasciitis 02/2011   R foot   Pneumonia    2012   PONV (postoperative nausea and vomiting)    pt states has gastroparesis has difficulty taking antibiotics and narcotics has severe nausea and vomiting    Restless leg syndrome    S/P endometrial ablation 08/09/2006   Novasure Ablation   S/P epidural steroid injection 09/20/2014   Dr.Ramos   Tremor    Dr. Everette Rank   Urinary frequency    Urinary incontinence     Family History  Problem Relation Age of Onset   Allergies Mother    Hypertension Mother    Heart disease Mother        possible valve problem -  leaking valve   Macular degeneration Mother    Macular degeneration Father    Heart disease Father        pacemaker, CHF   Hypertension Father    Diabetes Father        borderline   Stroke Father 71   Kidney disease Father    Allergies Sister    Asthma Sister    Irritable bowel syndrome Sister    Macular degeneration Sister    Heart disease Maternal Grandmother    Heart disease Maternal Grandfather    Heart disease Paternal Grandmother    Heart disease Paternal Grandfather    Cancer Maternal Aunt        leukemia   Cancer Maternal Aunt    Colon cancer Maternal Aunt        late 60's   CAD Neg Hx    Past Surgical History:  Procedure Laterality Date   CHOLECYSTECTOMY  07/2004   ENDOMETRIAL ABLATION  08/09/2006   Dr. Valentina Shaggy Ablation   FACET JOINT INJECTION  04/17/2017   Left L4-5 and L5-S1   FOOT SURGERY Right 06/29/2022   FUSION OF JOINT IN BIG TOE RT FOOT, Dr. Sherryle Lis   GASTRIC ROUX-EN-Y N/A 09/29/2019   Procedure: LAPAROSCOPIC  ROUX-EN-Y GASTRIC BYPASS WITH UPPER ENDOSCOPY, ERAS Pathway;  Surgeon: Johnathan Hausen, MD;  Location: [facility];  Service: General;  Laterality: N/A;   IRRIGATION AND DEBRIDEMENT FOOT Right 08/03/2022   Procedure: foot bone biopsy and hardware removal;  Surgeon: Criselda Peaches, DPM;  Location: [facility];  Service: Podiatry;  Laterality: Right;   KNEE ARTHROPLASTY     KNEE SURGERY  1999   R knee, Dr. Eddie Dibbles, torn cartilage   KYPHOPLASTY  11/06/2021   [id number]   RETINAL LASER PROCEDURE Right 08/28/2018   laser retinopexy   RIGHT/LEFT HEART CATH AND CORONARY ANGIOGRAPHY N/A 01/01/2018   Procedure: RIGHT/LEFT HEART CATH AND CORONARY ANGIOGRAPHY;  Surgeon: Sherren Mocha, MD;  Location: [place] [facility];  Service: Cardiovascular;  Laterality: N/A;   TONSILLECTOMY  1968   TONSILLECTOMY     TOTAL KNEE ARTHROPLASTY Right 09/05/2015   Procedure: RIGHT TOTAL KNEE ARTHROPLASTY;  Surgeon: Gaynelle Arabian, MD;  Location: [facility];  Service: Orthopedics;  Laterality: Right;   TOTAL KNEE ARTHROPLASTY Left 07/14/2018   Procedure: LEFT TOTAL KNEE ARTHROPLASTY;  Surgeon: Gaynelle Arabian, MD;  Location: [facility];  Service: Orthopedics;  Laterality: Left;   ULTRASOUND GUIDANCE FOR VASCULAR ACCESS  01/01/2018   Procedure: Ultrasound Guidance For Vascular Access;  Surgeon: Sherren Mocha, MD;  Location: [place] [facility];  Service: Cardiovascular;;   UMBILICAL HERNIA REPAIR N/A 09/29/2019   Procedure: HERNIA REPAIR UMBILICAL ADULT;  Surgeon: Johnathan Hausen, MD;  Location: [facility];  Service: General;  Laterality: N/A;   Social History   Social History Narrative   Married, 1 son in [place] (grandson born 06/2017), 1 stepson in [place], with 2 children      Updated 06/2022   Immunization History  Administered Date(s) Administered   Fluad Quad(high Dose 65+) 09/06/2022   Influenza Inj Mdck Quad Pf 09/13/2019   Influenza Split 09/17/2011, 09/03/2012, 09/04/2013   Influenza,inj,Quad PF,6+ Mos 08/16/2014,  07/27/2015, 07/31/2017, 08/13/2018, 07/28/2020   Influenza-Unspecified 10/04/2016, 07/31/2017, 09/23/2017, 09/18/2021   PFIZER Comirnaty(Gray Top)Covid-19 Tri-Sucrose Vaccine 06/01/2021   PFIZER(Purple Top)SARS-COV-2 Vaccination 02/20/2020, 03/12/2020, 09/20/2020   PNEUMOCOCCAL CONJUGATE-20 07/26/2022   Pfizer Covid-19 Vaccine Bivalent Booster 90yr & up 02/05/2022   Tdap 01/16/2011, 06/20/2017   Zoster Recombinat (Shingrix) 02/05/2019, 07/14/2019  Objective: Vital Signs: BP 129/81 (BP Location: Left Arm, Patient Position: Sitting, Cuff Size: Normal)   Pulse 83   Resp 16   Ht '5\' 2"'$  (1.575 m)   Wt 199 lb (90.3 kg)   LMP 07/27/2006   BMI 36.40 kg/m    Physical Exam Vitals and nursing note reviewed.  Constitutional:      Appearance: She is well-developed.  HENT:     Head: Normocephalic and atraumatic.  Eyes:     Conjunctiva/sclera: Conjunctivae normal.  Cardiovascular:     Rate and Rhythm: Normal rate and regular rhythm.     Heart sounds: Normal heart sounds.  Pulmonary:     Effort: Pulmonary effort is normal.     Breath sounds: Normal breath sounds.  Abdominal:     General: Bowel sounds are normal.     Palpations: Abdomen is soft.  Musculoskeletal:     Cervical back: Normal range of motion.  Skin:    General: Skin is warm and dry.     Capillary Refill: Capillary refill takes less than 2 seconds.  Neurological:     Mental Status: She is alert and oriented to person, place, and time.  Psychiatric:        Behavior: Behavior normal.      Musculoskeletal Exam: C-spine has good range of motion with no discomfort.  No midline spinal tenderness or SI joint tenderness.  Some trapezius muscle tension tenderness bilaterally.  Shoulder joints, elbow joints, wrist joints, MCPs, PIPs, DIPs have good range of motion with no synovitis.  Some PIP and DIP thickening noted.  Hip joints have good range of motion with no groin pain.  Some tenderness over the left trochanteric bursa.   Knee joints have good range of motion with no warmth or effusion.  Ankle joints have good range of motion with no tenderness or joint swelling.  CDAI Exam: CDAI Score: -- Patient Global: --; Provider Global: -- Swollen: --; Tender: -- Joint Exam 12/10/2022   No joint exam has been documented for this visit   There is currently no information documented on the homunculus. Go to the Rheumatology activity and complete the homunculus joint exam.  Investigation: No additional findings.  Imaging: No results found.  Recent Labs: Lab Results  Component Value Date   WBC 9.4 08/02/2022   HGB 12.3 08/02/2022   PLT 272 08/02/2022   NA 143 08/05/2022   K 4.0 08/05/2022   CL 109 08/05/2022   CO2 28 08/05/2022   GLUCOSE 101 (H) 08/05/2022   BUN 17 08/05/2022   CREATININE 0.75 08/05/2022   BILITOT 0.4 07/31/2022   ALKPHOS 139 (H) 07/31/2022   AST 44 (H) 07/31/2022   ALT 46 (H) 07/31/2022   PROT 6.4 (L) 07/31/2022   ALBUMIN 3.3 (L) 07/31/2022   CALCIUM 8.8 (L) 08/05/2022   GFRAA 81 11/07/2020    Speciality Comments: Forteo started 08/07/21 (first dose in clinic)  Procedures:  No procedures performed Allergies: Erythromycin, Meperidine hcl, Polyethyl glyc-propyl glyc pf, and Dilaudid [hydromorphone hcl]   Assessment / Plan:     Visit Diagnoses: Idiopathic chronic gout of multiple sites without tophus - She has not had any signs or symptoms of a gout flare.  She has clinically been doing well taking allopurinol 300 mg 1 tablet by mouth daily.  Uric acid was 4.7 on 07/17/2022-within the desirable range.  Her prescription for colchicine has expired since she has not needed to take it recently.  A refill of colchicine will be sent to  the pharmacy today.  She will remain on allopurinol as prescribed.  She was advised to notify us if she develops signs or symptoms of a gout flare.- Plan: CBC with Differential/Platelet, COMPLETE METABOLIC PANEL WITH GFR, Uric acid  Medication monitoring  encounter -orders for CBC, CMP, and uric acid level be checked today.  Plan: CBC with Differential/Platelet, COMPLETE METABOLIC PANEL WITH GFR  Primary osteoarthritis of both hands: She has PIP and DIP thickening consistent with osteoarthritis of both hands.  No tenderness or synovitis noted.  Complete fist formation bilaterally.  Primary osteoarthritis of both hips: Good range of motion of both hip joints with no groin pain.  Some tenderness over the left trochanteric bursa.  History of total knee replacement, bilateral: Doing well.  Good range of motion with no discomfort.  No warmth or effusion noted.  Abrasion of skin - Right great toe-History of osteomyelitis.  Completed IV antibiotics on 09/14/22. Under care of Dr West Bali.  She has been taking tramadol as needed for pain relief.  Primary osteoarthritis of both feet:She is not experiencing any increased joint pain or joint swelling at this time.  Good ROM of bot ankle joints with no tenderness or joint swelling.   Achilles tendinitis, right leg: Asymptomatic currently.   DDD (degenerative disc disease), cervical: Good range of motion with no discomfort.  No symptoms of radiculopathy.  She has some trapezius muscle tension and tenderness bilaterally.  She had trigger point injections performed on 06/06/2022.  DDD (degenerative disc disease), lumbar - She has been following up with Dr. Maxie Better at [facility].  No midline spinal tenderness at this time.   Fibromyalgia: She continues to experience intermittent myalgias and muscle tenderness due to fibromyalgia.  She is trapezius muscle tension and tenderness bilaterally.  Tenderness over the left trochanteric bursa noted.  She remains on Cymbalta and gabapentin as prescribed.  She takes tramadol as needed for pain relief.  Discussed the importance of regular exercise and good sleep hygiene.  Other fatigue: She has been experiencing significant fatigue on a daily basis since discontinuing  armodafinil.  Patient reports that she was titrating off of armodafinil as advised but her level of fatigue has been negatively affecting her quality of life.  She has been unable to complete some of her daily tasks and has been more sedentary due to the level of fatigue and daytime drowsiness.  Discussed the importance of regular exercise and good sleep hygiene.  Discussed the risks of long-term daily armodafinil use.  Patient would like to restart on armodafinil as needed basis when she is experiencing significant fatigue.  A new prescription will be sent to the pharmacy today written for 250 mg 1 tablet in the morning as needed for severe fatigue.  Trapezius muscle spasm: She has trigger point injections bilaterally on 06/07/22.   Age-related osteoporosis with current pathological fracture, initial encounter - DEXA 07/12/2020 reviewed today in the office: Right femoral neck: BMD 0.632 with T score -2.0.  H/o vertebral compression fractures. Due to update DEXA.  Patient plans on reaching out to her PCP to schedule an updated bone density. We are unable to order the DEXA at [facility] due to conflict of interest.  We can review the results with her once completed. Forteo started 08/07/21.  She remains on Forteo daily injections without any side effects or injection site reactions.  She is taking a calcium and vitamin D supplement daily.  History of vertebral compression fracture: No midline spinal tenderness.  Vitamin D deficiency: Vitamin D was 48 on 07/19/22.  She is taking a calcium and vitamin D supplement daily.  Other medical conditions are listed as follows:  History of migraine  History of depression  History of sleep apnea    Orders: Orders Placed This Encounter  Procedures   CBC with Differential/Platelet   COMPLETE METABOLIC PANEL WITH GFR   Uric acid   No orders of the defined types were placed in this encounter.    Follow-Up Instructions: Return in  about 6 months (around 06/10/2023) for Osteoarthritis, Gout.   Ofilia Neas, PA-C  Note - This record has been created using Dragon software.  Chart creation errors have been sought, but may not always  have been located. Such creation errors do not reflect on  the standard of medical care.

## 2022-12-10 ENCOUNTER — Encounter: Payer: Self-pay | Admitting: Physician Assistant

## 2022-12-10 ENCOUNTER — Other Ambulatory Visit: Payer: Self-pay

## 2022-12-10 ENCOUNTER — Ambulatory Visit: Payer: Medicare Other | Attending: Physician Assistant | Admitting: Physician Assistant

## 2022-12-10 VITALS — BP 129/81 | HR 83 | Resp 16 | Ht 62.0 in | Wt 199.0 lb

## 2022-12-10 DIAGNOSIS — M5136 Other intervertebral disc degeneration, lumbar region: Secondary | ICD-10-CM

## 2022-12-10 DIAGNOSIS — M19041 Primary osteoarthritis, right hand: Secondary | ICD-10-CM | POA: Diagnosis not present

## 2022-12-10 DIAGNOSIS — R5383 Other fatigue: Secondary | ICD-10-CM

## 2022-12-10 DIAGNOSIS — M19042 Primary osteoarthritis, left hand: Secondary | ICD-10-CM

## 2022-12-10 DIAGNOSIS — M797 Fibromyalgia: Secondary | ICD-10-CM

## 2022-12-10 DIAGNOSIS — E559 Vitamin D deficiency, unspecified: Secondary | ICD-10-CM

## 2022-12-10 DIAGNOSIS — Z8669 Personal history of other diseases of the nervous system and sense organs: Secondary | ICD-10-CM

## 2022-12-10 DIAGNOSIS — Z96653 Presence of artificial knee joint, bilateral: Secondary | ICD-10-CM

## 2022-12-10 DIAGNOSIS — M1A09X0 Idiopathic chronic gout, multiple sites, without tophus (tophi): Secondary | ICD-10-CM

## 2022-12-10 DIAGNOSIS — M8000XA Age-related osteoporosis with current pathological fracture, unspecified site, initial encounter for fracture: Secondary | ICD-10-CM

## 2022-12-10 DIAGNOSIS — M7661 Achilles tendinitis, right leg: Secondary | ICD-10-CM

## 2022-12-10 DIAGNOSIS — M62838 Other muscle spasm: Secondary | ICD-10-CM | POA: Diagnosis not present

## 2022-12-10 DIAGNOSIS — Z5181 Encounter for therapeutic drug level monitoring: Secondary | ICD-10-CM | POA: Diagnosis not present

## 2022-12-10 DIAGNOSIS — Z8659 Personal history of other mental and behavioral disorders: Secondary | ICD-10-CM

## 2022-12-10 DIAGNOSIS — M5030 Other cervical disc degeneration, unspecified cervical region: Secondary | ICD-10-CM | POA: Diagnosis not present

## 2022-12-10 DIAGNOSIS — M160 Bilateral primary osteoarthritis of hip: Secondary | ICD-10-CM

## 2022-12-10 DIAGNOSIS — T148XXA Other injury of unspecified body region, initial encounter: Secondary | ICD-10-CM

## 2022-12-10 DIAGNOSIS — Z8781 Personal history of (healed) traumatic fracture: Secondary | ICD-10-CM

## 2022-12-10 DIAGNOSIS — M19071 Primary osteoarthritis, right ankle and foot: Secondary | ICD-10-CM | POA: Diagnosis not present

## 2022-12-10 DIAGNOSIS — M19072 Primary osteoarthritis, left ankle and foot: Secondary | ICD-10-CM

## 2022-12-10 MED ORDER — COLCHICINE 0.6 MG PO TABS: 0.6000 mg | ORAL_TABLET | ORAL | 0 refills | Status: AC | PRN

## 2022-12-10 MED ORDER — ARMODAFINIL 250 MG PO TABS: 250.0000 mg | ORAL_TABLET | Freq: Every day | ORAL | 0 refills | Status: DC | PRN

## 2022-12-10 NOTE — Telephone Encounter (Signed)
Please review pended refills that were requested today at patient's appointment and sign. Thanks!

## 2022-12-11 ENCOUNTER — Other Ambulatory Visit (HOSPITAL_COMMUNITY): Payer: Self-pay

## 2022-12-11 ENCOUNTER — Other Ambulatory Visit: Payer: Self-pay | Admitting: *Deleted

## 2022-12-11 LAB — COMPLETE METABOLIC PANEL WITH GFR
AG Ratio: 1.3 (calc) (ref 1.0–2.5)
ALT: 18 U/L (ref 6–29)
AST: 21 U/L (ref 10–35)
Albumin: 3.9 g/dL (ref 3.6–5.1)
Alkaline phosphatase (APISO): 119 U/L (ref 37–153)
BUN: 21 mg/dL (ref 7–25)
CO2: 31 mmol/L (ref 20–32)
Calcium: 9.5 mg/dL (ref 8.6–10.4)
Chloride: 103 mmol/L (ref 98–110)
Creat: 0.92 mg/dL (ref 0.50–1.05)
Globulin: 2.9 g/dL (calc) (ref 1.9–3.7)
Glucose, Bld: 100 mg/dL — ABNORMAL HIGH (ref 65–99)
Potassium: 4.7 mmol/L (ref 3.5–5.3)
Sodium: 142 mmol/L (ref 135–146)
Total Bilirubin: 0.2 mg/dL (ref 0.2–1.2)
Total Protein: 6.8 g/dL (ref 6.1–8.1)
eGFR: 69 mL/min/{1.73_m2} (ref >=60)

## 2022-12-11 LAB — CBC WITH DIFFERENTIAL/PLATELET
Absolute Monocytes: 800 cells/uL (ref 200–950)
Basophils Absolute: 110 cells/uL (ref 0–200)
Basophils Relative: 1.2 %
Eosinophils Absolute: 202 cells/uL (ref 15–500)
Eosinophils Relative: 2.2 %
HCT: 39.9 % (ref 35.0–45.0)
Hemoglobin: 13.2 g/dL (ref 11.7–15.5)
Lymphs Abs: 2401 cells/uL (ref 850–3900)
MCH: 28.9 pg (ref 27.0–33.0)
MCHC: 33.1 g/dL (ref 32.0–36.0)
MCV: 87.3 fL (ref 80.0–100.0)
MPV: 10.6 fL (ref 7.5–12.5)
Monocytes Relative: 8.7 %
Neutro Abs: 5686 cells/uL (ref 1500–7800)
Neutrophils Relative %: 61.8 %
Platelets: 364 10*3/uL (ref 140–400)
RBC: 4.57 10*6/uL (ref 3.80–5.10)
RDW: 14.1 % (ref 11.0–15.0)
Total Lymphocyte: 26.1 %
WBC: 9.2 10*3/uL (ref 3.8–10.8)

## 2022-12-11 LAB — URIC ACID: Uric Acid, Serum: 5.9 mg/dL (ref 2.5–7.0)

## 2022-12-11 MED ORDER — ARMODAFINIL 250 MG PO TABS
250.0000 mg | ORAL_TABLET | Freq: Every day | ORAL | 0 refills | Status: DC | PRN
  Filled 2022-12-11: qty 30, 30d supply, fill #0

## 2022-12-11 MED ORDER — ARMODAFINIL 250 MG PO TABS: 250.0000 mg | ORAL_TABLET | Freq: Every day | ORAL | 0 refills | Status: DC | PRN

## 2022-12-11 NOTE — Progress Notes (Signed)
CBC and CMP WNL.  Uric acid is within the desirable range.

## 2022-12-17 ENCOUNTER — Other Ambulatory Visit: Payer: Self-pay | Admitting: Adult Health

## 2022-12-17 DIAGNOSIS — G4700 Insomnia, unspecified: Secondary | ICD-10-CM

## 2022-12-21 NOTE — Telephone Encounter (Signed)
Called [employer] for update on patient's PAP renewal. Per rep, appeal has not been processed despite it being sent in >1 month ago. Rep unable to verify why appeal has not been processed as all documents have been received.  Phone# [phone]  Knox Saliva, PharmD, MPH, BCPS, CPP Clinical Pharmacist (Rheumatology and Pulmonology)

## 2022-12-26 ENCOUNTER — Encounter: Payer: Self-pay | Admitting: Family Medicine

## 2022-12-26 ENCOUNTER — Ambulatory Visit (INDEPENDENT_AMBULATORY_CARE_PROVIDER_SITE_OTHER): Payer: Medicare Other | Admitting: Family Medicine

## 2022-12-26 VITALS — BP 130/82 | HR 64 | Temp 97.6°F | Ht 62.0 in | Wt 198.0 lb

## 2022-12-26 DIAGNOSIS — H5789 Other specified disorders of eye and adnexa: Secondary | ICD-10-CM

## 2022-12-26 DIAGNOSIS — H9201 Otalgia, right ear: Secondary | ICD-10-CM

## 2022-12-26 NOTE — Patient Instructions (Signed)
  There is no evidence of any eye infection.  Currently there is no redness or swelling. If you have recurrent symptoms, you can continue to use cool compresses, and eye drops as needed (systane or natural tears, something that keeps the eye moist). Your ears looked normal. Hard to predict if you are going to end up having a cold or some other illness (with recent onset of congestion), vs if this might be a mild allergy. Follow up if symptoms persist or worsen. Everything looks fine currently.

## 2022-12-26 NOTE — Progress Notes (Signed)
Chief Complaint  Patient presents with   Eye Drainage    Right eye was matted shut this morning, also red. Right ear pain and fullness that started yesterday. Drainage in her throat. No coughing or sneezing. No fever chills, or body aches. Husband had norovirus last week, diarrhea.    She woke up this morning with some crusting on the right eye, and her eye was very red and somewhat painful. The eye had crusting, but was not crusted shut. She also noted swelling below her eye. She applied a warm washcloth to help remove the crusts, and then applied cool compresses. She had some swelling of the lower eyelid, and the cold compress helped.  She hasn't used any eye drops. Denies trauma, injury, foreign body  Currently--less puffy, less red, isn't painful. She keeps feeling like there is stuff in the corner of her eye.  She had a slight earache yesterday, resolved, but now again has slight discomfort in her right ear currently. She has some right sided congestion. She has some clear drainage from the nose. Some PND, rare cough.  No sick contacts   PMH, PSH, SH reviewed  Outpatient Encounter Medications as of 12/26/2022  Medication Sig Note   acetaminophen (TYLENOL) 325 MG tablet Take 2 tablets (650 mg total) by mouth every 4 (four) hours as needed for mild pain or moderate pain (or Fever >/= 101). 12/26/2022: Took 2 this am   allopurinol (ZYLOPRIM) 300 MG tablet Take 1 tablet (300 mg total) by mouth daily.    ALPRAZolam (XANAX) 0.25 MG tablet TAKE 1 TABLET BY MOUTH 3 TIMES DAILY AS NEEDED FOR ANXIETY. 12/26/2022: Took one last night   ARIPiprazole (ABILIFY) 5 MG tablet Take 1 tablet (5 mg total) by mouth daily.    aspirin EC 81 MG tablet Take 81 mg by mouth in the morning. Swallow whole.    atorvastatin (LIPITOR) 40 MG tablet Take 40 mg by mouth daily.    CALCIUM PO Take 500 mg by mouth in the morning, at noon, and at bedtime.    cetirizine (ZYRTEC) 10 MG tablet Take 10 mg by mouth at  bedtime.    DULoxetine (CYMBALTA) 60 MG capsule Take 1 capsule (60 mg total) by mouth 2 (two) times daily. (Patient taking differently: Take 60 mg by mouth in the morning and at bedtime.)    estradiol (ESTRACE VAGINAL) 0.1 MG/GM vaginal cream Place 1 g vaginally 2 (two) times a week.    eszopiclone (LUNESTA) 2 MG TABS tablet TAKE 1 TABLET (2 MG TOTAL) BY MOUTH IMMEDIATELY BEFORE BEDTIME AS NEEDED FOR SLEEP    fluticasone (FLONASE) 50 MCG/ACT nasal spray PLACE 2 SPRAYS INTO BOTH NOSTRILS DAILY AS NEEDED FOR ALLERGIES (Patient taking differently: Place 2 sprays into both nostrils daily.)    FORTEO 600 MCG/2.4ML SOPN INJECT 20MCG UNDER THE SKIN ONCE DAILY    gabapentin (NEURONTIN) 300 MG capsule Take 300 mg by mouth 3 (three) times daily.    Insulin Pen Needle (B-D UF III MINI PEN NEEDLES) 31G X 5 MM MISC USE TO INJECT FORTEO ONCE DAILY. DISCARD AFTER USE.    metoprolol tartrate (LOPRESSOR) 50 MG tablet TAKE 1 TABLET BY MOUTH TWICE A DAY    Multiple Vitamins-Minerals (PRESERVISION AREDS 2) CAPS Take 1 capsule by mouth in the morning and at bedtime.    OXcarbazepine (TRILEPTAL) 150 MG tablet Take 150 mg by mouth in the morning and at bedtime.    pantoprazole (PROTONIX) 40 MG tablet Take 1 tablet (40 mg  total) by mouth daily. (Patient taking differently: Take 40 mg by mouth at bedtime.)    PRESCRIPTION MEDICATION CPAP- At bedtime    Probiotic Product (PROBIOTIC ADVANCED PO) Take 1 capsule by mouth at bedtime.    RESTASIS 0.05 % ophthalmic emulsion Place 1 drop into both eyes in the morning and at bedtime.    SYSTANE ULTRA PF 0.4-0.3 % SOLN Place 1 drop into both eyes 3 (three) times daily as needed (for dryness).    traMADol (ULTRAM) 50 MG tablet Take 50 mg by mouth 3 (three) times daily as needed. 12/26/2022: Took one two days ago   albuterol (VENTOLIN HFA) 108 (90 Base) MCG/ACT inhaler TAKE 2 PUFFS BY MOUTH EVERY 6 HOURS AS NEEDED FOR WHEEZE OR SHORTNESS OF BREATH (Patient not taking: Reported on  12/26/2022) 09/26/2022: prn   Armodafinil 250 MG tablet Take 1 tablet (250 mg total) by mouth daily as needed. (Patient not taking: Reported on 12/26/2022) 12/26/2022: prn   colchicine 0.6 MG tablet Take 1 tablet (0.6 mg total) by mouth as needed. (Patient not taking: Reported on 12/26/2022) 12/26/2022: prn   Dextromethorphan HBr (DELSYM PO) Take 10 mLs by mouth as needed. (Patient not taking: Reported on 12/26/2022) 12/26/2022: prn   meclizine (ANTIVERT) 25 MG tablet Take 1 tablet (25 mg total) by mouth 3 (three) times daily as needed for dizziness. (Patient not taking: Reported on 12/26/2022) 09/26/2022: prn   methocarbamol (ROBAXIN) 500 MG tablet TAKE 1 TABLET BY MOUTH EVERY DAY AS NEEDED (Patient not taking: Reported on 12/26/2022) 09/26/2022: prn   ondansetron (ZOFRAN) 8 MG tablet Take 8 mg by mouth every 8 (eight) hours as needed for nausea. (Patient not taking: Reported on 12/26/2022) 09/26/2022: prn   promethazine (PHENERGAN) 25 MG tablet Take 25 mg by mouth every 6 (six) hours as needed for nausea or vomiting. (Patient not taking: Reported on 12/26/2022) 12/26/2022: Took one yesterday   [DISCONTINUED] gentamicin cream (GARAMYCIN) 0.1 % Apply 1 Application topically in the morning and at bedtime. (Patient not taking: Reported on 12/10/2022)    [DISCONTINUED] melatonin 5 MG TABS Take 5 mg by mouth at bedtime.  (Patient not taking: Reported on 12/10/2022) 09/26/2022: Took last night   [DISCONTINUED] Phenylephrine-Aspirin (ALKA-SELTZER PLUS SINUS PO) Take 2 capsules by mouth as needed. (Patient not taking: Reported on 12/10/2022) 09/26/2022: Took this am @ 8am, acetaminaphen,dextromethophan and phenylephrine     No facility-administered encounter medications on file as of 12/26/2022.   Allergies  Allergen Reactions   Erythromycin Nausea Only and Other (See Comments)    Abdominal pain, also   Meperidine Hcl Other (See Comments)    Made the patient feel hot and her heart raced- made her feel "flushed," also    Polyethyl Glyc-Propyl Glyc Pf Nausea And Vomiting and Other (See Comments)    RN confirmed with patient 9/4 (Miralax)   Dilaudid [Hydromorphone Hcl] Itching   ROS:  R ear/eye symptoms per HPI.  Swelling and redness of R eye has improved.  Denies eye pain or vision changes. No f/c, body aches.  No diarrhea (husband recently had norovirus). +HA today. Denies dizziness, chest pain, shortness of breath.  PHYSICAL EXAM:  BP 130/82   Pulse 64   Temp 97.6 F (36.4 C) (Tympanic)   Ht '5\' 2"'$  (1.575 m)   Wt 198 lb (89.8 kg)   LMP 07/27/2006   BMI 36.21 kg/m   Wt Readings from Last 3 Encounters:  12/26/22 198 lb (89.8 kg)  12/10/22 199 lb (90.3 kg)  11/05/22  190 lb (86.2 kg)   Pleasant, well-appearing female in no distress HEENT: conjunctiva and sclera are clear.  No crusting or purulence noted. No abnormal fluorescein uptake. Nl TMs bilaterally.  Nasal mucosa with mild edema, clear-white mucus R>L OP clear. Sinuses are nontender Neck: no lymphadenopathy or mass Heart: regular rate and rhythm Lungs: clear bilaterally Neuro: alert and oriented, cranial nerves intact, normal gait Psych: normal mood, affect, hygiene and grooming   ASSESSMENT/PLAN:  Eye redness - redness, swelling and crusting have resolved, normal exam today, reassured.  Acute otalgia, right - normal exam.  Has some congestion--poss start of URI (vs allergies), to use supportive measures. No e/o bacterial infection.  There is no evidence of any eye infection.  Currently there is no redness or swelling. If you have recurrent symptoms, you can continue to use cool compresses, and eye drops as needed (systane or natural tears, something that keeps the eye moist). Your ears looked normal. Hard to predict if you are going to end up having a cold or some other illness (with recent onset of congestion), vs if this might be a mild allergy. Follow up if symptoms persist or worsen. Everything looks fine currently.

## 2022-12-27 ENCOUNTER — Telehealth: Payer: Self-pay

## 2022-12-27 NOTE — Progress Notes (Signed)
Patient ID: [name], female   DOB: 07/07/1956, 67 y.o.   MRN: [id number]  Care Management & Coordination Services Pharmacy Team   Reason for Encounter: General adherence update   Contacted patient for general health update and medication adherence call.  Spoke with patient on 12/28/2022    What concerns do you have about your medications?  The patient denies side effects with their medications.   How often do you forget or accidentally miss a dose? Never  Do you use a pillbox? No  Are you having any problems getting your medications from your pharmacy? No  Has the cost of your medications been a concern? No  Since last visit with PharmD, no interventions have been made.   The patient has had an ED visit since last contact.   The patient denies problems with their health.   Patient denies concerns or questions for Theo Dills, PharmD at this time.   Counseled patient on: Great job taking medications, Patient reports she does not need an appointment to follow up with Pharmacist at this time.      Chart Review:  Recent office visits:  12/26/22 Rita Ohara, MD - Patient presented for Eye redness and other concerns. Stopped Gentamicin. Stopped Melatonin. Stopped Phenylephrine.   09/26/22 Rita Ohara, MD - Patient presented for Acute non recurrent maxillary sinusitis. Prescribed Azithromycin.    Recent consult visits:  12/10/22 Ofilia Neas PA-C (Rheumatology) - Patient presented for Idiopathic chronic gout of multiple sites without tophus and other concerns. No medication changes.   11/05/22 Rosiland Oz, MD ( Inf Disease) - Patient presented for Osteomyelitis of great toe of right foot and other concerns. Stopped Azithromycin.   10/04/22 Criselda Peaches, DPM (Podiatry) - Patient presented for Charcot's joint of right foot and other concerns. No medication changes.   09/25/22 Rosiland Oz, MD - Patient presented for Osteomyelitis of great toe of right foot and  other concerns. Patient reported not taking : Armodafinil, Atorvastatin, Daptomycin, Mupirocin &Nortriptyline.   09/20/22 Criselda Peaches, DPM - Pateint presented for Charcot's joint of right foot and other concerns. No medication changes.  09/21/22 Mozingo, [facility] Ogren [facility] - Patient presented for Major depressive disorder. No other visit details available.   09/06/22 Criselda Peaches, DPM - Pateint presented for Charcot's joint of right foot and other concerns. Prescribed Gentamicin Sulfate.  Hospital visits:  Medication Reconciliation was completed by comparing discharge summary, patient's EMR and Pharmacy list, and upon discussion with patient.   Patient presented to [facility] on 07/[facility]/22 due to Osteomyelitis of great toe of right foot. Patient was present for 9 days.   New?Medications Started at [facility] Discharge:?? -started  daptomycin IVPB (CUBICIN)   Medication Changes at Hospital Discharge: -Changed  acetaminophen (TYLENOL) HYDROcodone-acetaminophen (NORCO/VICODIN)   Medications Discontinued at Hospital Discharge: -Stopped  cefadroxil 500 MG capsule (DURICEF   Medications that remain the same after Hospital Discharge:??  -All other medications will remain the same Medications: Outpatient Encounter Medications as of 12/27/2022  Medication Sig Note   acetaminophen (TYLENOL) 325 MG tablet Take 2 tablets (650 mg total) by mouth every 4 (four) hours as needed for mild pain or moderate pain (or Fever >/= 101). 12/26/2022: Took 2 this am   albuterol (VENTOLIN HFA) 108 (90 Base) MCG/ACT inhaler TAKE 2 PUFFS BY MOUTH EVERY 6 HOURS AS NEEDED FOR WHEEZE OR SHORTNESS OF BREATH (Patient not taking: Reported on 12/26/2022) 09/26/2022: prn   allopurinol (ZYLOPRIM) 300 MG tablet Take 1  tablet (300 mg total) by mouth daily.    ALPRAZolam (XANAX) 0.25 MG tablet TAKE 1 TABLET BY MOUTH 3 TIMES DAILY AS NEEDED FOR ANXIETY. 12/26/2022: Took one last night   ARIPiprazole  (ABILIFY) 5 MG tablet Take 1 tablet (5 mg total) by mouth daily.    Armodafinil 250 MG tablet Take 1 tablet (250 mg total) by mouth daily as needed. (Patient not taking: Reported on 12/26/2022) 12/26/2022: prn   aspirin EC 81 MG tablet Take 81 mg by mouth in the morning. Swallow whole.    atorvastatin (LIPITOR) 40 MG tablet Take 40 mg by mouth daily.    CALCIUM PO Take 500 mg by mouth in the morning, at noon, and at bedtime.    cetirizine (ZYRTEC) 10 MG tablet Take 10 mg by mouth at bedtime.    colchicine 0.6 MG tablet Take 1 tablet (0.6 mg total) by mouth as needed. (Patient not taking: Reported on 12/26/2022) 12/26/2022: prn   Dextromethorphan HBr (DELSYM PO) Take 10 mLs by mouth as needed. (Patient not taking: Reported on 12/26/2022) 12/26/2022: prn   DULoxetine (CYMBALTA) 60 MG capsule Take 1 capsule (60 mg total) by mouth 2 (two) times daily. (Patient taking differently: Take 60 mg by mouth in the morning and at bedtime.)    estradiol (ESTRACE VAGINAL) 0.1 MG/GM vaginal cream Place 1 g vaginally 2 (two) times a week.    eszopiclone (LUNESTA) 2 MG TABS tablet TAKE 1 TABLET (2 MG TOTAL) BY MOUTH IMMEDIATELY BEFORE BEDTIME AS NEEDED FOR SLEEP    fluticasone (FLONASE) 50 MCG/ACT nasal spray PLACE 2 SPRAYS INTO BOTH NOSTRILS DAILY AS NEEDED FOR ALLERGIES (Patient taking differently: Place 2 sprays into both nostrils daily.)    FORTEO 600 MCG/2.4ML SOPN INJECT 20MCG UNDER THE SKIN ONCE DAILY    gabapentin (NEURONTIN) 300 MG capsule Take 300 mg by mouth 3 (three) times daily.    Insulin Pen Needle (B-D UF III MINI PEN NEEDLES) 31G X 5 MM MISC USE TO INJECT FORTEO ONCE DAILY. DISCARD AFTER USE.    meclizine (ANTIVERT) 25 MG tablet Take 1 tablet (25 mg total) by mouth 3 (three) times daily as needed for dizziness. (Patient not taking: Reported on 12/26/2022) 09/26/2022: prn   methocarbamol (ROBAXIN) 500 MG tablet TAKE 1 TABLET BY MOUTH EVERY DAY AS NEEDED (Patient not taking: Reported on 12/26/2022) 09/26/2022:  prn   metoprolol tartrate (LOPRESSOR) 50 MG tablet TAKE 1 TABLET BY MOUTH TWICE A DAY    Multiple Vitamins-Minerals (PRESERVISION AREDS 2) CAPS Take 1 capsule by mouth in the morning and at bedtime.    ondansetron (ZOFRAN) 8 MG tablet Take 8 mg by mouth every 8 (eight) hours as needed for nausea. (Patient not taking: Reported on 12/26/2022) 09/26/2022: prn   OXcarbazepine (TRILEPTAL) 150 MG tablet Take 150 mg by mouth in the morning and at bedtime.    pantoprazole (PROTONIX) 40 MG tablet Take 1 tablet (40 mg total) by mouth daily. (Patient taking differently: Take 40 mg by mouth at bedtime.)    PRESCRIPTION MEDICATION CPAP- At bedtime    Probiotic Product (PROBIOTIC ADVANCED PO) Take 1 capsule by mouth at bedtime.    promethazine (PHENERGAN) 25 MG tablet Take 25 mg by mouth every 6 (six) hours as needed for nausea or vomiting. (Patient not taking: Reported on 12/26/2022) 12/26/2022: Took one yesterday   RESTASIS 0.05 % ophthalmic emulsion Place 1 drop into both eyes in the morning and at bedtime.    SYSTANE ULTRA PF 0.4-0.3 % SOLN Place  1 drop into both eyes 3 (three) times daily as needed (for dryness).    traMADol (ULTRAM) 50 MG tablet Take 50 mg by mouth 3 (three) times daily as needed. 12/26/2022: Took one two days ago   No facility-administered encounter medications on file as of 12/27/2022.   Care Gaps: AWV- 06/2021 COVID Booster - Overdue Colonoscopy - Overdue Mammogram - Overdue  Star Rating Drugs: Atorvastatin 40 mg - Last filled 11/02/22 90 DS at [place] Pharmacist Assistant [phone]

## 2023-01-02 ENCOUNTER — Other Ambulatory Visit: Payer: Self-pay | Admitting: Physician Assistant

## 2023-01-10 NOTE — Telephone Encounter (Signed)
Received call from [facility] regarding [employer] application. Per rep, prescriber consent is needed again. Rep states this expired at the end of January 2024. I advised that if they had reviewed the application on time, this would not have expired. Nevertheless form has been refaxed.  [phone] Phone# [phone]  Knox Saliva, PharmD, MPH, BCPS, CPP Clinical Pharmacist (Rheumatology and Pulmonology)

## 2023-01-10 NOTE — Telephone Encounter (Signed)
Called [employer] for update on patient's [employer] PAP renewal application appeal. Per rep, application is still under review. No turnaround time. I have again requested case to be expedited.   Phone# [phone]   Knox Saliva, PharmD, MPH, BCPS, CPP Clinical Pharmacist (Rheumatology and Pulmonology)

## 2023-01-10 NOTE — Telephone Encounter (Signed)
Received a fax from  [facility] regarding an approval for [employer] patient assistance from 01/10/2023 to 11/26/2023. Approval letter sent to scan center.  Phone# [phone]  Mychart message sent to patient to advise.  Knox Saliva, PharmD, MPH, BCPS, CPP Clinical Pharmacist (Rheumatology and Pulmonology)

## 2023-01-21 DIAGNOSIS — M79671 Pain in right foot: Secondary | ICD-10-CM | POA: Diagnosis not present

## 2023-01-21 DIAGNOSIS — M79672 Pain in left foot: Secondary | ICD-10-CM | POA: Diagnosis not present

## 2023-01-22 DIAGNOSIS — M79674 Pain in right toe(s): Secondary | ICD-10-CM | POA: Diagnosis not present

## 2023-01-22 DIAGNOSIS — M79671 Pain in right foot: Secondary | ICD-10-CM | POA: Diagnosis not present

## 2023-01-30 NOTE — Progress Notes (Signed)
Chief Complaint  Patient presents with   Pre-diabetes    Nonfasting(patient ate) med check. Will come back for NV for lipids. Did not get RSV, wants to know if she should go ahead and get now or wait? Will take covid booster today. Albuterol is expired, needs refill. Also needs refill on Flonase.   Patient presents for 6 month follow-up on chronic problems. She has no specific complaints.  Pre-diabetes and obesity:  A1c was up to 6.3% in 2019.  It was  5.9% on last check in 07/26/22. She had Roux-En-Y on 09/29/2019.  She had gotten down into the 160's.  She has regained some weight. She hasn't had f/u with the surgeons. She is frustrated by her weight gain, relates this to her issues with her feet, and affecting her moods. She sees GI regularly, last in 08/2022.  Had been doing well, with some nausea (and some heaving) 2x/week, r/b zofran prn. GERD controlled with PPI.  Lab Results  Component Value Date   HGBA1C 5.9 (A) 07/26/2022   Wt Readings from Last 3 Encounters:  01/31/23 195 lb 9.6 oz (88.7 kg)  12/26/22 198 lb (89.8 kg)  12/10/22 199 lb (90.3 kg)    Hyperlipidemia follow-up:  Patient is trying to follow a low-fat, low cholesterol diet as best she can.  Eats red meat 1-2x/week. Occasional eggs, no butter or cheese.  Uses vinaigrette dressing.  Compliant with medications (atorvastatin '40mg'$ ) and denies medication side effects. Lipids were at goal on last check, due for recheck. She is not fasting today. She was off the statin while taking daptomycin in the fall. Lab Results  Component Value Date   CHOL 117 02/05/2022   HDL 48 02/05/2022   LDLCALC 45 02/05/2022   TRIG 137 02/05/2022   CHOLHDL 2.4 02/05/2022    Hypertension: (never formally diagnosed, because had been on diuretics for fluid retention (stopped a while ago) and beta blockers for tachycardia and tremor--but high BP's noted when not taking these medications in past).   She continues on metoprolol '50mg'$  BID (originally  prescribed by Dr. Johnsie Cancel, for tachycardia). Denies chest pain, palpitations/tachycardia, lightheadedness, edema. Occasional headache. BP Readings from Last 3 Encounters:  01/31/23 132/80  12/26/22 130/82  12/10/22 129/81     She is under the care of rheumatology clinic for fibromyalgia, osteoarthritis, degenerative disc disease, chronic fatigue, osteoporosis.    She continues on cymbalta, gabapentin (also for neuropathy in feet), and robaxin '500mg'$  BID prn. They prescribe her armodafinil for her chronic fatigue. She has been weaning off of this, using it only when "very zapped out".   She tried stopping it, but fatigue was much worse.  She uses 1/2 tablet 2-3x/week. She stopped getting massages (when she had the foot trouble and surgeries), hasn't restarted. Her fibromyalgia hasn't been too bad, has pain daily but reports it is manageable most of the time. Bothers her the most to stand still (washing dishes), but has been managing to do this (husband recently had hand surgery).   She had T12 and L1 compression fractures after a fall in 01/2020. She had worsening back pain in July/August 2022, and was found to have new, acute compression fractures. She ultimately had kyphoplasty done 10/2021 by Dr. Kathyrn Sheriff. She was started on Forteo injections by rheum in 07/2021. DEXA in 2018 was normal.  DEXA 06/2020 T-2.0 R fem neck, osteopenia; per rheum notes, planning to recheck in Fall 2023.  This has not been done/ordered yet.   Gout--she continues on allopurinol '300mg'$   daily, and hasn't had any flares in several years. Her colchicine has expired, never really needed to take it. Lab Results  Component Value Date   LABURIC 5.9 12/10/2022     She has OSA, under the care of Dr. Radford Pax. She is on CPAP.  Energy is improved, but still requires Nuvigil, though is using this much less often now, as reported above.   She is under the care of Dr. Valaria Good for RLS, neuropathy (pedal paresthesias), tremor (benign  essential vs accentuated physiologic tremor), migraines. Last seen in 08/2022. She takes gabapentin for restless legs as well as her neuropathy pain.  RLS is well controlled.  She has headaches often, but not migraines.  Headaches are at her temples most of the time. Tylenol sometimes helps with HA, uses tramadol prn.   Depression:  Sees Regina at [facility], continues on Cymbalta and Abilify. At her last visit she reported her moods weren't as good since she hadn't been able to exercise, energy is worse.  Upset with herself about weight gain, feels like a failure. This was related to the issues with her great toe/foot. She continues to have issues with her feet--having pain and swelling in the left foot.  She sees Dr. Lucia Gaskins.  She was treated with ABX, f/b prednisone--completed course today.  Noticed significant improvement with the steroids, better x 3 days. Gabapentin dose was increased recently by Dr. Lucia Gaskins Today has been a rough day--besides today, her moods are better than last time.   GERD and h/o gastroparesis: Protonix was cut back to once daily after her Roux-en-Y.  Only occasionally has nausea and vomiting, gastroparesis is much better.  Uses zofran about 1-2x/week for nausea/heaving  She is requesting refill of albuterol--was first put on it several years ago, when she was sick.  She was seeing pulmonary docs at one point. Would like to have an unexpired inhaler on hand, for prn use.  PMH, PSH, SH reviewed  Outpatient Encounter Medications as of 01/31/2023  Medication Sig Note   acetaminophen (TYLENOL) 325 MG tablet Take 2 tablets (650 mg total) by mouth every 4 (four) hours as needed for mild pain or moderate pain (or Fever >/= 101). 01/31/2023: Took 2 last night   allopurinol (ZYLOPRIM) 300 MG tablet Take 1 tablet (300 mg total) by mouth daily.    ALPRAZolam (XANAX) 0.25 MG tablet TAKE 1 TABLET BY MOUTH 3 TIMES DAILY AS NEEDED FOR ANXIETY. 01/31/2023: Last dose last night   ARIPiprazole  (ABILIFY) 5 MG tablet Take 1 tablet (5 mg total) by mouth daily.    aspirin EC 81 MG tablet Take 81 mg by mouth in the morning. Swallow whole.    atorvastatin (LIPITOR) 40 MG tablet Take 40 mg by mouth daily.    CALCIUM PO Take 500 mg by mouth in the morning, at noon, and at bedtime.    cetirizine (ZYRTEC) 10 MG tablet Take 10 mg by mouth at bedtime.    DULoxetine (CYMBALTA) 60 MG capsule Take 1 capsule (60 mg total) by mouth 2 (two) times daily. (Patient taking differently: Take 60 mg by mouth in the morning and at bedtime.)    estradiol (ESTRACE VAGINAL) 0.1 MG/GM vaginal cream Place 1 g vaginally 2 (two) times a week.    eszopiclone (LUNESTA) 2 MG TABS tablet TAKE 1 TABLET (2 MG TOTAL) BY MOUTH IMMEDIATELY BEFORE BEDTIME AS NEEDED FOR SLEEP    fluticasone (FLONASE) 50 MCG/ACT nasal spray PLACE 2 SPRAYS INTO BOTH NOSTRILS DAILY AS NEEDED FOR ALLERGIES (  Patient taking differently: Place 2 sprays into both nostrils daily.)    FORTEO 600 MCG/2.4ML SOPN INJECT 20MCG UNDER THE SKIN ONCE DAILY    gabapentin (NEURONTIN) 300 MG capsule Take 300 mg by mouth 3 (three) times daily. 01/31/2023: Increased to QID (2qhs)   Insulin Pen Needle (B-D UF III MINI PEN NEEDLES) 31G X 5 MM MISC USE TO INJECT FORTEO ONCE DAILY. DISCARD AFTER USE.    metoprolol tartrate (LOPRESSOR) 50 MG tablet TAKE 1 TABLET BY MOUTH TWICE A DAY    Multiple Vitamins-Minerals (PRESERVISION AREDS 2) CAPS Take 1 capsule by mouth in the morning and at bedtime.    OXcarbazepine (TRILEPTAL) 150 MG tablet Take 150 mg by mouth in the morning and at bedtime.    pantoprazole (PROTONIX) 40 MG tablet Take 1 tablet (40 mg total) by mouth daily. (Patient taking differently: Take 40 mg by mouth at bedtime.) 01/31/2023: One daily   PRESCRIPTION MEDICATION CPAP- At bedtime    Probiotic Product (PROBIOTIC ADVANCED PO) Take 1 capsule by mouth at bedtime.    RESTASIS 0.05 % ophthalmic emulsion Place 1 drop into both eyes in the morning and at bedtime.     SYSTANE ULTRA PF 0.4-0.3 % SOLN Place 1 drop into both eyes 3 (three) times daily as needed (for dryness).    traMADol (ULTRAM) 50 MG tablet Take 50 mg by mouth 3 (three) times daily as needed. 01/31/2023: prn   albuterol (VENTOLIN HFA) 108 (90 Base) MCG/ACT inhaler TAKE 2 PUFFS BY MOUTH EVERY 6 HOURS AS NEEDED FOR WHEEZE OR SHORTNESS OF BREATH (Patient not taking: Reported on 12/26/2022) 01/31/2023: prn   Armodafinil 250 MG tablet Take 1 tablet (250 mg total) by mouth daily as needed. (Patient not taking: Reported on 12/26/2022) 01/31/2023: 1/2 tablet about 3x/week   colchicine 0.6 MG tablet Take 1 tablet (0.6 mg total) by mouth as needed. (Patient not taking: Reported on 12/26/2022) 01/31/2023: prn   meclizine (ANTIVERT) 25 MG tablet Take 1 tablet (25 mg total) by mouth 3 (three) times daily as needed for dizziness. (Patient not taking: Reported on 12/26/2022) 01/31/2023: prn   methocarbamol (ROBAXIN) 500 MG tablet TAKE 1 TABLET BY MOUTH EVERY DAY AS NEEDED (Patient not taking: Reported on 12/26/2022) 01/31/2023: prn   ondansetron (ZOFRAN) 8 MG tablet Take 8 mg by mouth every 8 (eight) hours as needed for nausea. (Patient not taking: Reported on 12/26/2022) 01/31/2023: prn   promethazine (PHENERGAN) 25 MG tablet Take 25 mg by mouth every 6 (six) hours as needed for nausea or vomiting. (Patient not taking: Reported on 12/26/2022) 01/31/2023: Took one yesterday   [DISCONTINUED] Dextromethorphan HBr (DELSYM PO) Take 10 mLs by mouth as needed. (Patient not taking: Reported on 12/26/2022) 12/26/2022: prn   No facility-administered encounter medications on file as of 01/31/2023.   Allergies  Allergen Reactions   Erythromycin Nausea Only and Other (See Comments)    Abdominal pain, also   Meperidine Hcl Other (See Comments)    Made the patient feel hot and her heart raced- made her feel "flushed," also   Polyethyl Glyc-Propyl Glyc Pf Nausea And Vomiting and Other (See Comments)    RN confirmed with patient 9/4 (Miralax)    Dilaudid [Hydromorphone Hcl] Itching    ROS: no fever, chills, URI symptoms. No chest pain, palpitations. No abdominal pain, bowel changes.  Nausea, per HPI. No dysphagia Headaches (temporal) per HPI. Fibromyalgia and back pain are stable. Fatigue unchanged. Moods are controlled. L foot pain improved with course of prednisone. See  HPI   PHYSICAL EXAM:  BP 132/80   Pulse 64   Ht '5\' 2"'$  (1.575 m)   Wt 195 lb 9.6 oz (88.7 kg)   LMP 07/27/2006   BMI 35.78 kg/m   Wt Readings from Last 3 Encounters:  01/31/23 195 lb 9.6 oz (88.7 kg)  12/26/22 198 lb (89.8 kg)  12/10/22 199 lb (90.3 kg)   Pleasant female, in good spirits, in no distress HEENT: conjunctiva and sclera are clear, EOMI.   Neck: no lymphadenopathy or mass, no bruit. Heart: regular rate and rhythm Lungs: clear bilaterally, no wheezes, rales, ronchi Abdomen: soft, nontender. No organomegaly or mass Back: no spinal or CVA tenderness, no SI tenderness. No trigger points or back tenderness Extremities: no edema.  Wearing a post-op shoe on the left. Feet were not examined. Skin: normal turgor, no rash Psych: normal mood, affect, hygiene and grooming Neuro: alert and oriented. Normal gait. No significant tremor noted.  Lab Results  Component Value Date   HGBA1C 6.3 (A) 01/31/2023    ASSESSMENT/PLAN:  Mixed hyperlipidemia - cont statin and lowfat, low cholesterol diet. Return for fasting labs - Plan: Lipid panel  Fibromyalgia - stable  History of Roux-en-Y gastric bypass - Has regained some weight; depression and foot issues are a factor. Counseled re: portions, good food choices, non-weight-bearing exercise  Impaired fasting glucose - Plan: HgB A1c  Gastroesophageal reflux disease without esophagitis - controlled  OSA (obstructive sleep apnea) - cont use of CPAP  Osteoporosis, unspecified osteoporosis type, unspecified pathological fracture presence - on Forteo through rheum. Advised to check with them re:  DEXA (due). Cont Ca, D, weight-bearing exercise  Reactive airway disease without complication, unspecified asthma severity, unspecified whether persistent - refilled albuterol, which is mainly used just prn when sick - Plan: albuterol (VENTOLIN HFA) 108 (90 Base) MCG/ACT inhaler  Allergic rhinitis, unspecified seasonality, unspecified trigger - Plan: fluticasone (FLONASE) 50 MCG/ACT nasal spray  Need for COVID-19 vaccine - Plan: Pfizer Fall 2023 Covid-19 Vaccine 33yr and older  Discussed RSV vaccine, encouraged her to get in the Fall, from the pharmacy.  Recent cbc, c-met and uric acid (11/2022) from rheum were reviewed.   F/u as scheduled in September for [facility]

## 2023-01-31 ENCOUNTER — Other Ambulatory Visit: Payer: Self-pay | Admitting: Family Medicine

## 2023-01-31 ENCOUNTER — Encounter: Payer: Self-pay | Admitting: Family Medicine

## 2023-01-31 ENCOUNTER — Ambulatory Visit (INDEPENDENT_AMBULATORY_CARE_PROVIDER_SITE_OTHER): Payer: Medicare Other | Admitting: Family Medicine

## 2023-01-31 VITALS — BP 132/80 | HR 64 | Ht 62.0 in | Wt 195.6 lb

## 2023-01-31 DIAGNOSIS — Z23 Encounter for immunization: Secondary | ICD-10-CM | POA: Diagnosis not present

## 2023-01-31 DIAGNOSIS — G4733 Obstructive sleep apnea (adult) (pediatric): Secondary | ICD-10-CM

## 2023-01-31 DIAGNOSIS — J45909 Unspecified asthma, uncomplicated: Secondary | ICD-10-CM | POA: Diagnosis not present

## 2023-01-31 DIAGNOSIS — K219 Gastro-esophageal reflux disease without esophagitis: Secondary | ICD-10-CM

## 2023-01-31 DIAGNOSIS — Z9884 Bariatric surgery status: Secondary | ICD-10-CM

## 2023-01-31 DIAGNOSIS — J309 Allergic rhinitis, unspecified: Secondary | ICD-10-CM

## 2023-01-31 DIAGNOSIS — R7301 Impaired fasting glucose: Secondary | ICD-10-CM

## 2023-01-31 DIAGNOSIS — E782 Mixed hyperlipidemia: Secondary | ICD-10-CM

## 2023-01-31 DIAGNOSIS — M797 Fibromyalgia: Secondary | ICD-10-CM | POA: Diagnosis not present

## 2023-01-31 DIAGNOSIS — M810 Age-related osteoporosis without current pathological fracture: Secondary | ICD-10-CM

## 2023-01-31 LAB — POCT GLYCOSYLATED HEMOGLOBIN (HGB A1C): Hemoglobin A1C: 6.3 % — AB (ref 4.0–5.6)

## 2023-01-31 MED ORDER — ALBUTEROL SULFATE HFA 108 (90 BASE) MCG/ACT IN AERS: INHALATION_SPRAY | RESPIRATORY_TRACT | 0 refills | Status: AC

## 2023-01-31 MED ORDER — FLUTICASONE PROPIONATE 50 MCG/ACT NA SUSP: 2.0000 | Freq: Every day | NASAL | 11 refills | Status: DC

## 2023-01-31 NOTE — Patient Instructions (Addendum)
I recommend getting the RSV vaccine from the pharmacy in the Fall.  Talk about when you are due for your next bone density test when you see the rheumatologist next month.  (Last one was 06/2020).    Your sugars were up some--still in the pre-diabetic range.  Please continue to try and limit the sugar and sweets and carbs in your diet. Continue to work on your weight. Try and get some aerobic exercise, even if you need to do seated exercises until your foot is better.

## 2023-02-05 ENCOUNTER — Other Ambulatory Visit: Payer: Self-pay | Admitting: Family Medicine

## 2023-02-05 ENCOUNTER — Other Ambulatory Visit: Payer: Medicare Other

## 2023-02-05 ENCOUNTER — Telehealth: Payer: Self-pay | Admitting: Adult Health

## 2023-02-05 DIAGNOSIS — Z1231 Encounter for screening mammogram for malignant neoplasm of breast: Secondary | ICD-10-CM

## 2023-02-05 MED ORDER — HYDROXYZINE HCL 25 MG PO TABS: 25.0000 mg | ORAL_TABLET | Freq: Every day | ORAL | 0 refills | Status: DC

## 2023-02-05 NOTE — Telephone Encounter (Signed)
Patient called c/o of not sleeping with the Lunesta. She said she has been having issues for a while but she keeps thinking it will get better and then when she thinks she should call she sleeps. She tries to go to bed the same time, watches TV or crafts before bed. If she is able to get to sleep she doesn't sleep but 3-4 hours and can't get back to sleep. She takes Armodafinil 250 mg prescribed by a different provider. She said she doesn't take it every day, is only taking 1/2 tab, and takes it first thing in the morning. She rates depression as 2/10, anxiety as 3/10. No new stressors. She has been on Lunesta 2 mg for a while. In looking at her medication history she had been on 3 mg previously, unsure of results.

## 2023-02-05 NOTE — Telephone Encounter (Signed)
We can offer Hydroxyzine - '25mg'$  - 1 to 2 at hs.

## 2023-02-05 NOTE — Telephone Encounter (Signed)
Patient called in stating that her Lunesta isn't working. "Haven't slept the past three nights." She would like a rtc to discuss trying something else. Ph: [phone] Appt 4/17

## 2023-02-05 NOTE — Telephone Encounter (Signed)
Rx sent, patient notified

## 2023-02-06 ENCOUNTER — Other Ambulatory Visit: Payer: Medicare Other

## 2023-02-06 DIAGNOSIS — E782 Mixed hyperlipidemia: Secondary | ICD-10-CM | POA: Diagnosis not present

## 2023-02-07 ENCOUNTER — Other Ambulatory Visit: Payer: Self-pay | Admitting: Family Medicine

## 2023-02-07 ENCOUNTER — Telehealth: Payer: Self-pay | Admitting: Adult Health

## 2023-02-07 LAB — LIPID PANEL
Chol/HDL Ratio: 2.6 ratio (ref 0.0–4.4)
Cholesterol, Total: 110 mg/dL (ref 100–199)
HDL: 43 mg/dL (ref >39)
LDL Chol Calc (NIH): 42 mg/dL (ref 0–99)
Triglycerides: 148 mg/dL (ref 0–149)
VLDL Cholesterol Cal: 25 mg/dL (ref 5–40)

## 2023-02-07 MED ORDER — ATORVASTATIN CALCIUM 40 MG PO TABS: 40.0000 mg | ORAL_TABLET | Freq: Every day | ORAL | 3 refills | Status: DC

## 2023-02-07 NOTE — Telephone Encounter (Signed)
Please call to schedule an earlier appt.

## 2023-02-07 NOTE — Telephone Encounter (Signed)
Pt LVM @ 11:31a.  She said she called on Monday.  She said she had a new script sent in 2 days ago and she hasn't slept the last 2 nights.  It's been a total of 4 nights that she hasn't been able to sleep.  She wants to know what she can do.  Next appt 4/17

## 2023-02-07 NOTE — Telephone Encounter (Signed)
Pt is scheduled for 02/11/23

## 2023-02-08 NOTE — Telephone Encounter (Signed)
Patient said she slept last night. She woke up at 3 AM, but was able to go back to sleep. She has an appt with [name] on 3/18.

## 2023-02-11 ENCOUNTER — Ambulatory Visit: Payer: Medicare Other | Admitting: Adult Health

## 2023-02-11 ENCOUNTER — Encounter: Payer: Self-pay | Admitting: Adult Health

## 2023-02-11 DIAGNOSIS — F411 Generalized anxiety disorder: Secondary | ICD-10-CM | POA: Diagnosis not present

## 2023-02-11 DIAGNOSIS — F3181 Bipolar II disorder: Secondary | ICD-10-CM | POA: Diagnosis not present

## 2023-02-11 DIAGNOSIS — G4700 Insomnia, unspecified: Secondary | ICD-10-CM

## 2023-02-11 MED ORDER — HYDROXYZINE HCL 25 MG PO TABS: ORAL_TABLET | ORAL | 2 refills | Status: DC

## 2023-02-11 MED ORDER — ALPRAZOLAM 0.25 MG PO TABS: 0.2500 mg | ORAL_TABLET | Freq: Three times a day (TID) | ORAL | 2 refills | Status: DC | PRN

## 2023-02-11 NOTE — Progress Notes (Signed)
[name] [id number] [birth year] 67 y.o.  Subjective:   Patient ID:  [name] is a 67 y.o. (DOB Jun 06, 1956) female.  Chief Complaint: No chief complaint on file.   HPI HENDY SONNE presents to the office today for follow-up of anxiety, depression, and insomnia.  Describes mood today as "ok". Pleasant. Mood symptoms - reports depression and anxiety - "situational stressors". Has been feeling "down" about her weight. Reports some irritability - "at times". Reports some worry, rumination and over thinking. Mood is consistent. Stating "today has been a rough day - doing better overall". Feels like medications are helpful. Stable interest and motivation. Taking medications as prescribed.  Energy levels lower. Active, does not have a regular exercise routine with current physical disabilities.  Enjoys some usual interests and activities. Married. Lives with husband. Has 2 sons - one local and another one in [place]. Mother local - 4. Spending time with family. Appetite ade quate. Weight gain with inactivity - 185 pounds. Sleeps better some nights than others.  Focus and concentration stable. Completing tasks. Managing aspects of household. Retired. Denies SI or HI.  Denies AH or VH Denies self harm. Denies substance use.  Bariatric surgery 11/20.   [place] Office Visit from 11/05/2022 in [facility] for Infectious Disease Most recent reading at 11/05/2022  9:32 AM Office Visit from 09/25/2022 in [facility] for Infectious Disease Most recent reading at 09/25/2022  9:38 AM Office Visit from 07/26/2022 in [place] Most recent reading at 07/26/2022  2:41 PM Video Visit from 07/26/2022 in [facility] for Infectious Disease Most recent reading at 07/26/2022  9:13 AM Office Visit from 07/24/2022 in [facility] for Infectious Disease Most recent reading at 07/24/2022  2:28 PM  PHQ-2 Total Score 1  0 2 1 0  PHQ-9 Total Score -- -- 9 -- --      Flowsheet Row ED to Hosp-Admission (Discharged) from 07/30/2022 in [place] ED from 12/31/2021 in [facility] Emergency Department at [facility] ED from 07/04/2021 in [facility] Emergency Department at [place] No Risk No Risk No Risk        Review of Systems:  Review of Systems  Musculoskeletal:  Negative for gait problem.  Neurological:  Negative for tremors.  Psychiatric/Behavioral:         Please refer to HPI    Medications: I have reviewed the patient's current medications.  Current Outpatient Medications  Medication Sig Dispense Refill   acetaminophen (TYLENOL) 325 MG tablet Take 2 tablets (650 mg total) by mouth every 4 (four) hours as needed for mild pain or moderate pain (or Fever >/= 101).     albuterol (VENTOLIN HFA) 108 (90 Base) MCG/ACT inhaler TAKE 2 PUFFS BY MOUTH EVERY 6 HOURS AS NEEDED FOR WHEEZE OR SHORTNESS OF BREATH 8.5 each 0   allopurinol (ZYLOPRIM) 300 MG tablet Take 1 tablet (300 mg total) by mouth daily. 90 tablet 3   ALPRAZolam (XANAX) 0.25 MG tablet Take 1 tablet (0.25 mg total) by mouth 3 (three) times daily as needed for anxiety. 90 tablet 2   ARIPiprazole (ABILIFY) 5 MG tablet Take 1 tablet (5 mg total) by mouth daily. 90 tablet 3   Armodafinil 250 MG tablet Take 1 tablet (250 mg total) by mouth daily as needed. (Patient not taking: Reported on 12/26/2022) 30 tablet 0   aspirin EC 81 MG tablet  Take 81 mg by mouth in the morning. Swallow whole.     atorvastatin (LIPITOR) 40 MG tablet Take 1 tablet (40 mg total) by mouth daily. 90 tablet 3   CALCIUM PO Take 500 mg by mouth in the morning, at noon, and at bedtime.     cetirizine (ZYRTEC) 10 MG tablet Take 10 mg by mouth at bedtime.     colchicine 0.6 MG tablet Take 1 tablet (0.6 mg total) by mouth as needed. (Patient not taking: Reported on 12/26/2022) 30 tablet 0   DULoxetine (CYMBALTA) 60 MG capsule Take  1 capsule (60 mg total) by mouth 2 (two) times daily. (Patient taking differently: Take 60 mg by mouth in the morning and at bedtime.) 180 capsule 3   estradiol (ESTRACE VAGINAL) 0.1 MG/GM vaginal cream Place 1 g vaginally 2 (two) times a week. 42.5 g 2   fluticasone (FLONASE) 50 MCG/ACT nasal spray Place 2 sprays into both nostrils daily. 16 g 11   FORTEO 600 MCG/2.4ML SOPN INJECT 20MCG UNDER THE SKIN ONCE DAILY 7.2 mL 0   gabapentin (NEURONTIN) 300 MG capsule Take 300 mg by mouth 3 (three) times daily.     hydrOXYzine (ATARAX) 25 MG tablet Take one tablet four times daily. 120 tablet 2   Insulin Pen Needle (B-D UF III MINI PEN NEEDLES) 31G X 5 MM MISC USE TO INJECT FORTEO ONCE DAILY. DISCARD AFTER USE. 100 each 2   meclizine (ANTIVERT) 25 MG tablet Take 1 tablet (25 mg total) by mouth 3 (three) times daily as needed for dizziness. (Patient not taking: Reported on 12/26/2022) 30 tablet 0   methocarbamol (ROBAXIN) 500 MG tablet TAKE 1 TABLET BY MOUTH EVERY DAY AS NEEDED (Patient not taking: Reported on 12/26/2022) 30 tablet 2   metoprolol tartrate (LOPRESSOR) 50 MG tablet TAKE 1 TABLET BY MOUTH TWICE A DAY 180 tablet 1   Multiple Vitamins-Minerals (PRESERVISION AREDS 2) CAPS Take 1 capsule by mouth in the morning and at bedtime.     ondansetron (ZOFRAN) 8 MG tablet Take 8 mg by mouth every 8 (eight) hours as needed for nausea. (Patient not taking: Reported on 12/26/2022)  0   OXcarbazepine (TRILEPTAL) 150 MG tablet Take 150 mg by mouth in the morning and at bedtime.     pantoprazole (PROTONIX) 40 MG tablet Take 1 tablet (40 mg total) by mouth daily. (Patient taking differently: Take 40 mg by mouth at bedtime.) 90 tablet 0   PRESCRIPTION MEDICATION CPAP- At bedtime     Probiotic Product (PROBIOTIC ADVANCED PO) Take 1 capsule by mouth at bedtime.     promethazine (PHENERGAN) 25 MG tablet Take 25 mg by mouth every 6 (six) hours as needed for nausea or vomiting. (Patient not taking: Reported on 12/26/2022)      RESTASIS 0.05 % ophthalmic emulsion Place 1 drop into both eyes in the morning and at bedtime.     SYSTANE ULTRA PF 0.4-0.3 % SOLN Place 1 drop into both eyes 3 (three) times daily as needed (for dryness).     traMADol (ULTRAM) 50 MG tablet Take 50 mg by mouth 3 (three) times daily as needed.     No current facility-administered medications for this visit.    Medication Side Effects: None  Allergies:  Allergies  Allergen Reactions   Erythromycin Nausea Only and Other (See Comments)    Abdominal pain, also   Meperidine Hcl Other (See Comments)    Made the patient feel hot and her heart raced- made her feel "  flushed," also   Polyethyl Glyc-Propyl Glyc Pf Nausea And Vomiting and Other (See Comments)    RN confirmed with patient 9/4 (Miralax)   Dilaudid [Hydromorphone Hcl] Itching    Past Medical History:  Diagnosis Date   Anemia    previously followed by Dr. Jamse Arn for anemia and elevated platelets   Anxiety    C. difficile colitis 10/01/2012   treated by [facility]   Chronic fatigue syndrome    Closed wedge compression fracture of T8 vertebra ([place]) 06/2021   DDD (degenerative disc disease), lumbar 08/19/2014   and facet arthroplasty & left lumbar radiculopathy (Dr.Ramos)   Depression    Dyssynergia    dyssynergenic defecation, contributing to fecal incontinence.   Edema    Fibromyalgia    Gastroparesis    followed at [facility]   GERD (gastroesophageal reflux disease)    History of kidney stones    History of vertebral fracture 06/30/2021   Hyperlipidemia    Kidney stone    Lumbar radiculopathy    Migraine    Neuropathy    Obstructive sleep apnea    Does  wear  CPAP   Osteoporosis    Paresthesia    Dr. Everette Rank at [facility]   Pelvic floor dysfunction    pelvic floor dyssynergy   Plantar fasciitis 02/2011   R foot   Pneumonia    2012   PONV (postoperative nausea and vomiting)    pt states has gastroparesis has difficulty taking antibiotics and narcotics has  severe nausea and vomiting    Restless leg syndrome    S/P endometrial ablation 08/09/2006   Novasure Ablation   S/P epidural steroid injection 09/20/2014   Dr.Ramos   Tremor    Dr. Everette Rank   Urinary frequency    Urinary incontinence     Past Medical History, Surgical history, Social history, and Family history were reviewed and updated as appropriate.   Please see review of systems for further details on the patient's review from today.   Objective:   Physical Exam:  LMP 07/27/2006   Physical Exam Constitutional:      General: She is not in acute distress. Musculoskeletal:        General: No deformity.  Neurological:     Mental Status: She is alert and oriented to person, place, and time.     Coordination: Coordination normal.  Psychiatric:        Attention and Perception: Attention and perception normal. She does not perceive auditory or visual hallucinations.        Mood and Affect: Mood normal. Mood is not anxious or depressed. Affect is not labile, blunt, angry or inappropriate.        Speech: Speech normal.        Behavior: Behavior normal.        Thought Content: Thought content normal. Thought content is not paranoid or delusional. Thought content does not include homicidal or suicidal ideation. Thought content does not include homicidal or suicidal plan.        Cognition and Memory: Cognition and memory normal.        Judgment: Judgment normal.     Comments: Insight intact     Lab Review:     Component Value Date/Time   NA 142 12/10/2022 0954   NA 141 02/05/2022 1204   K 4.7 12/10/2022 0954   CL 103 12/10/2022 0954   CO2 31 12/10/2022 0954   GLUCOSE 100 (H) 12/10/2022 0954   BUN 21 12/10/2022 0954  BUN 17 02/05/2022 1204   CREATININE 0.92 12/10/2022 0954   CALCIUM 9.5 12/10/2022 0954   PROT 6.8 12/10/2022 0954   PROT 6.5 02/05/2022 1204   ALBUMIN 3.3 (L) 07/31/2022 0317   ALBUMIN 4.0 02/05/2022 1204   AST 21 12/10/2022 0954   ALT 18 12/10/2022 0954    ALKPHOS 139 (H) 07/31/2022 0317   BILITOT 0.2 12/10/2022 0954   BILITOT <0.2 02/05/2022 1204   GFRNONAA >60 08/05/2022 0326   GFRNONAA 68 07/04/2020 1129   GFRAA 81 11/07/2020 1029   GFRAA 79 07/04/2020 1129       Component Value Date/Time   WBC 9.2 12/10/2022 0954   RBC 4.57 12/10/2022 0954   HGB 13.2 12/10/2022 0954   HGB 12.5 07/17/2022 1614   HGB 11.7 05/02/2011 1412   HCT 39.9 12/10/2022 0954   HCT 38.0 07/17/2022 1614   HCT 37.1 05/02/2011 1412   PLT 364 12/10/2022 0954   PLT 431 07/17/2022 1614   MCV 87.3 12/10/2022 0954   MCV 91 07/17/2022 1614   MCV 94.4 05/02/2011 1412   MCH 28.9 12/10/2022 0954   MCHC 33.1 12/10/2022 0954   RDW 14.1 12/10/2022 0954   RDW 13.2 07/17/2022 1614   RDW 18.3 (H) 05/02/2011 1412   LYMPHSABS 2,401 12/10/2022 0954   LYMPHSABS 3.2 (H) 07/17/2022 1614   LYMPHSABS 2.4 05/02/2011 1412   MONOABS 0.9 08/02/2022 0341   MONOABS 0.8 05/02/2011 1412   EOSABS 202 12/10/2022 0954   EOSABS 0.3 07/17/2022 1614   BASOSABS 110 12/10/2022 0954   BASOSABS 0.1 07/17/2022 1614   BASOSABS 0.1 05/02/2011 1412    No results found for: "POCLITH", "LITHIUM"   No results found for: "PHENYTOIN", "PHENOBARB", "VALPROATE", "CBMZ"   .res Assessment: Plan:    Plan:  Xanax 0.25mg  TID Cymbalta 60mg  BID Abilify 5mg  daily  Added between visits - Hydroxyzine 25mg  - take one to four tablets at hs  Melatonin PRN  RTC 6 months  Patient advised to contact office with any questions, adverse effects, or acute worsening in signs and symptoms.  Discussed potential metabolic side effects associated with atypical antipsychotics, as well as potential risk for movement side effects. Advised pt to contact office if movement side effects occur.    Time spent with patient was 25 minutes. Greater than 50% of face to face time with patient was spent on counseling and coordination of care.    Diagnoses and all orders for this visit:  Insomnia, unspecified type -      ALPRAZolam (XANAX) 0.25 MG tablet; Take 1 tablet (0.25 mg total) by mouth 3 (three) times daily as needed for anxiety.  Generalized anxiety disorder -     hydrOXYzine (ATARAX) 25 MG tablet; Take one tablet four times daily.  Bipolar II disorder ([place])     Please see After Visit Summary for patient specific instructions.  Future Appointments  Date Time Provider [place]  03/13/2023 11:20 AM Keimon Basaldua, Berdie Ogren, NP CP-CP None  03/20/2023 10:00 AM Tamela Gammon, NP GCG-GCG None  03/25/2023 11:50 AM GI-BCG MM 2 GI-BCGMM GI-BREAST CE  06/17/2023  9:40 AM Bo Merino, MD CR-GSO None  08/22/2023  8:45 AM Rita Ohara, MD PFM-PFM PFSM    No orders of the defined types were placed in this encounter.   -------------------------------

## 2023-02-12 ENCOUNTER — Other Ambulatory Visit: Payer: Self-pay | Admitting: Physician Assistant

## 2023-02-12 ENCOUNTER — Other Ambulatory Visit (HOSPITAL_COMMUNITY): Payer: Self-pay

## 2023-02-12 MED ORDER — ARMODAFINIL 250 MG PO TABS
250.0000 mg | ORAL_TABLET | Freq: Every day | ORAL | 0 refills | Status: DC | PRN
  Filled 2023-02-12: qty 30, 30d supply, fill #0

## 2023-02-12 NOTE — Telephone Encounter (Signed)
Next Visit: 06/17/2023  Last Visit: 12/10/2022  Last Fill: 12/11/2022  Dx: Other fatigue   Current Dose per office note on 12/10/2022: armodafinil  250 mg 1 tablet in the morning as needed for severe fatigue   Okay to refill Armodafinil?

## 2023-02-18 ENCOUNTER — Other Ambulatory Visit: Payer: Self-pay | Admitting: Adult Health

## 2023-02-18 DIAGNOSIS — F411 Generalized anxiety disorder: Secondary | ICD-10-CM

## 2023-02-24 ENCOUNTER — Other Ambulatory Visit: Payer: Self-pay | Admitting: Adult Health

## 2023-02-24 DIAGNOSIS — F411 Generalized anxiety disorder: Secondary | ICD-10-CM

## 2023-03-05 ENCOUNTER — Telehealth: Payer: Self-pay | Admitting: Pharmacist

## 2023-03-05 NOTE — Telephone Encounter (Signed)
Patient left VM stating that she is having difficulty re-ordering her Forteo refill. Returned call to patient and spoke with her husband. Provided with phone number for both [facility] and [facility].  Chesley Mires, PharmD, MPH, BCPS, CPP Clinical Pharmacist (Rheumatology and Pulmonology)

## 2023-03-06 ENCOUNTER — Ambulatory Visit: Payer: Medicare Other | Admitting: Nurse Practitioner

## 2023-03-06 ENCOUNTER — Encounter: Payer: Self-pay | Admitting: Family Medicine

## 2023-03-06 ENCOUNTER — Telehealth: Payer: Self-pay | Admitting: Adult Health

## 2023-03-06 ENCOUNTER — Ambulatory Visit (INDEPENDENT_AMBULATORY_CARE_PROVIDER_SITE_OTHER): Payer: Medicare Other | Admitting: Family Medicine

## 2023-03-06 VITALS — BP 112/72 | HR 64 | Temp 97.7°F | Ht 62.0 in | Wt 196.0 lb

## 2023-03-06 DIAGNOSIS — J302 Other seasonal allergic rhinitis: Secondary | ICD-10-CM

## 2023-03-06 DIAGNOSIS — J029 Acute pharyngitis, unspecified: Secondary | ICD-10-CM

## 2023-03-06 DIAGNOSIS — J020 Streptococcal pharyngitis: Secondary | ICD-10-CM | POA: Diagnosis not present

## 2023-03-06 LAB — POCT RAPID STREP A (OFFICE): Rapid Strep A Screen: POSITIVE — AB

## 2023-03-06 MED ORDER — AMOXICILLIN 875 MG PO TABS: 875.0000 mg | ORAL_TABLET | Freq: Two times a day (BID) | ORAL | 0 refills | Status: DC

## 2023-03-06 NOTE — Patient Instructions (Signed)
Take the antibiotic twice daily for 10 days. Change your toothbrush after 24-48 hours. You also have either allergies or a cold also contributing to your symptoms (you don't have nasal congestion and mucus from a simple strep throat). You may continue your flonase, zyrtec and Alka seltzer medication as needed.

## 2023-03-06 NOTE — Progress Notes (Signed)
Chief Complaint  Patient presents with   Sore Throat    Sore throat, nasal congestion, slight cough and ear fullness. Mucus is clear in color. Started Friday. Did covid test last night and was negative. Strep is positive.    4/5 she started with slight sore throat, some chills.  She thought it was getting better, but throat got worse last night.  She thinks allergies are also flaring--sniffling, sneezing, runny nose.  Taking alka selzer gelcaps for allergies, sinus and cough, which is helping.  She is taking it 3-4x/day. She continues on flonase and cetirizine daily. Nasal mucus is clear.  Fullness to her left ear, denies pain, hearing is normal.  Tylenol helps just a little. Throat spray and lozenges help some.  No known sick contacts. Took grandson to a children's museum Friday. COVID test negative last night.  PMH, PSH, SH reviewed  Outpatient Encounter Medications as of 03/06/2023  Medication Sig Note   acetaminophen (TYLENOL) 325 MG tablet Take 2 tablets (650 mg total) by mouth every 4 (four) hours as needed for mild pain or moderate pain (or Fever >/= 101). 03/06/2023: Took 2 this am   allopurinol (ZYLOPRIM) 300 MG tablet Take 1 tablet (300 mg total) by mouth daily.    ALPRAZolam (XANAX) 0.25 MG tablet Take 1 tablet (0.25 mg total) by mouth 3 (three) times daily as needed for anxiety. 03/06/2023: Took one last night   ARIPiprazole (ABILIFY) 5 MG tablet Take 1 tablet (5 mg total) by mouth daily.    Armodafinil 250 MG tablet Take 1 tablet (250 mg total) by mouth daily as needed.    aspirin EC 81 MG tablet Take 81 mg by mouth in the morning. Swallow whole.    atorvastatin (LIPITOR) 40 MG tablet Take 1 tablet (40 mg total) by mouth daily.    CALCIUM PO Take 500 mg by mouth in the morning, at noon, and at bedtime.    cetirizine (ZYRTEC) 10 MG tablet Take 10 mg by mouth at bedtime.    DM-Phenylephrine-Acetaminophen (ALKA-SELTZER PLS SINUS & COUGH) 10-5-325 MG CAPS Take 2 each by mouth as  needed. 03/06/2023: Took 2 at 10:00 (gel caps)   DULoxetine (CYMBALTA) 60 MG capsule Take 1 capsule (60 mg total) by mouth 2 (two) times daily. (Patient taking differently: Take 60 mg by mouth in the morning and at bedtime.)    estradiol (ESTRACE VAGINAL) 0.1 MG/GM vaginal cream Place 1 g vaginally 2 (two) times a week.    fluticasone (FLONASE) 50 MCG/ACT nasal spray Place 2 sprays into both nostrils daily.    FORTEO 600 MCG/2.4ML SOPN INJECT UNDER THE SKIN ONCE DAILY    gabapentin (NEURONTIN) 300 MG capsule Take 300 mg by mouth 4 (four) times daily.    hydrOXYzine (ATARAX) 25 MG tablet Take one tablet four times daily. 03/06/2023: Took one last night   Insulin Pen Needle (B-D UF III MINI PEN NEEDLES) 31G X 5 MM MISC USE TO INJECT FORTEO ONCE DAILY. DISCARD AFTER USE.    metoprolol tartrate (LOPRESSOR) 50 MG tablet TAKE 1 TABLET BY MOUTH TWICE A DAY    Multiple Vitamins-Minerals (PRESERVISION AREDS 2) CAPS Take 1 capsule by mouth in the morning and at bedtime.    OXcarbazepine (TRILEPTAL) 150 MG tablet Take 150 mg by mouth in the morning and at bedtime.    pantoprazole (PROTONIX) 40 MG tablet Take 1 tablet (40 mg total) by mouth daily. (Patient taking differently: Take 40 mg by mouth at bedtime.) 01/31/2023: One daily  PRESCRIPTION MEDICATION CPAP- At bedtime    Probiotic Product (PROBIOTIC ADVANCED PO) Take 1 capsule by mouth at bedtime.    RESTASIS 0.05 % ophthalmic emulsion Place 1 drop into both eyes in the morning and at bedtime.    SYSTANE ULTRA PF 0.4-0.3 % SOLN Place 1 drop into both eyes 3 (three) times daily as needed (for dryness).    albuterol (VENTOLIN HFA) 108 (90 Base) MCG/ACT inhaler TAKE 2 PUFFS BY MOUTH EVERY 6 HOURS AS NEEDED FOR WHEEZE OR SHORTNESS OF BREATH (Patient not taking: Reported on 03/06/2023) 03/06/2023: As needed   colchicine 0.6 MG tablet Take 1 tablet (0.6 mg total) by mouth as needed. (Patient not taking: Reported on 12/26/2022) 03/06/2023: As needed   meclizine  (ANTIVERT) 25 MG tablet Take 1 tablet (25 mg total) by mouth 3 (three) times daily as needed for dizziness. (Patient not taking: Reported on 12/26/2022) 03/06/2023: As needed   methocarbamol (ROBAXIN) 500 MG tablet TAKE 1 TABLET BY MOUTH EVERY DAY AS NEEDED (Patient not taking: Reported on 12/26/2022) 03/06/2023: As needed   ondansetron (ZOFRAN) 8 MG tablet Take 8 mg by mouth every 8 (eight) hours as needed for nausea. (Patient not taking: Reported on 12/26/2022) 03/06/2023: As needed   promethazine (PHENERGAN) 25 MG tablet Take 25 mg by mouth every 6 (six) hours as needed for nausea or vomiting. (Patient not taking: Reported on 12/26/2022) 03/06/2023: As needed   traMADol (ULTRAM) 50 MG tablet Take 50 mg by mouth 3 (three) times daily as needed. (Patient not taking: Reported on 03/06/2023) 03/06/2023: prn   No facility-administered encounter medications on file as of 03/06/2023.   Allergies  Allergen Reactions   Erythromycin Nausea Only and Other (See Comments)    Abdominal pain, also   Meperidine Hcl Other (See Comments)    Made the patient feel hot and her heart raced- made her feel "flushed," also   Polyethyl Glyc-Propyl Glyc Pf Nausea And Vomiting and Other (See Comments)    RN confirmed with patient 9/4 (Miralax)   Dilaudid [Hydromorphone Hcl] Itching   ROS: Had chills initially, resolved. No known fevers.  URI/allergy symptoms per HPI.  Sore throat and L ear fullness. More frequent BM's, after eating, and looked green, not her normal BM's over the weekend.  Better the last couple of days.  Still has some occasional N/V, no more than usual. No urinary complaints, bleeding, bruising, or rashes   PHYSICAL EXAM:  BP 112/72   Pulse 64   Temp 97.7 F (36.5 C) (Tympanic)   Ht 5\' 2"  (1.575 m)   Wt 196 lb (88.9 kg)   LMP 07/27/2006   BMI 35.85 kg/m   Well-appearing, pleasant female in no distress HEENT: conjunctiva and sclera are clear, EOMI. Mild-mod edema of nasal mucosa.  No purulence.  Sinuses nontender. TM's and EAC's normal. OP--tonsils are absent.  Very mild erythema posteriorly Neck: no lymphadenopathy or mass Heart: regular rate and rhythm Lungs: clear bilaterally Extremities: no edema Neuro: alert and oriented, cranial nerves grossly intact, normal gait.   Rapid strep +   ASSESSMENT/PLAN:  Strep pharyngitis - counseled re: meds, how long contagious, expected course, change toothbrush. May have some ST contrib from PND/allergies - Plan: amoxicillin (AMOXIL) 875 MG tablet  Sore throat - Plan: Rapid Strep A  Seasonal allergic rhinitis, unspecified trigger - cont flonase, zyrtec.  This may contribute to sore throat, PND.

## 2023-03-06 NOTE — Telephone Encounter (Signed)
Has she tried taking the Xanax with it?

## 2023-03-06 NOTE — Telephone Encounter (Signed)
Patient taking 4 tabs of hydroxyzine and is not sleeping, has trouble getting to sleep and staying asleep. She takes armodafinil. She doesn't take every day and said she takes it early when needed. Denies late caffeine use. No regular bedtime routine. Has tried [place] without benefit. Hasn't tried trazodone.   Last seen 3/18.

## 2023-03-06 NOTE — Telephone Encounter (Signed)
Pt LVM for Almira Coaster stating she needs to try another medicine for sleep.  Next appt 9/17

## 2023-03-07 ENCOUNTER — Other Ambulatory Visit: Payer: Self-pay | Admitting: Family Medicine

## 2023-03-07 NOTE — Telephone Encounter (Signed)
How much Hydroxyzine did she take it with?

## 2023-03-07 NOTE — Telephone Encounter (Signed)
Patient has taken Xanax with hydroxyzine and she said it didn't help. She said she slept last night, probably because she was "so exhausted." She has just been diagnosed with strep throat.

## 2023-03-08 DIAGNOSIS — M79671 Pain in right foot: Secondary | ICD-10-CM | POA: Diagnosis not present

## 2023-03-08 DIAGNOSIS — M79672 Pain in left foot: Secondary | ICD-10-CM | POA: Diagnosis not present

## 2023-03-08 NOTE — Telephone Encounter (Signed)
Patient said she took 4 hydroxyzine and when she still couldn't sleep she took a Xanax. She said she was put on prednisone today for a foot issue.

## 2023-03-08 NOTE — Telephone Encounter (Signed)
LVM to RC 

## 2023-03-11 ENCOUNTER — Other Ambulatory Visit: Payer: Self-pay

## 2023-03-11 ENCOUNTER — Other Ambulatory Visit: Payer: Self-pay | Admitting: Family Medicine

## 2023-03-11 ENCOUNTER — Ambulatory Visit: Payer: Medicare Other | Admitting: Nurse Practitioner

## 2023-03-11 NOTE — Telephone Encounter (Signed)
No Xanax - may use hydroxyzine. Try by itself first - prednisone may be an issue, how long is she taking that for?

## 2023-03-11 NOTE — Telephone Encounter (Signed)
The Prednisone could interfere with sleep. We could also try Ambien 5mg  temporarily.

## 2023-03-11 NOTE — Telephone Encounter (Signed)
Pended.

## 2023-03-12 ENCOUNTER — Ambulatory Visit: Payer: Medicare Other | Admitting: Adult Health

## 2023-03-12 MED ORDER — ZOLPIDEM TARTRATE 5 MG PO TABS: 5.0000 mg | ORAL_TABLET | Freq: Every evening | ORAL | 0 refills | Status: DC | PRN

## 2023-03-13 ENCOUNTER — Ambulatory Visit: Payer: Medicare Other | Admitting: Adult Health

## 2023-03-19 ENCOUNTER — Other Ambulatory Visit: Payer: Self-pay | Admitting: Adult Health

## 2023-03-19 DIAGNOSIS — F411 Generalized anxiety disorder: Secondary | ICD-10-CM

## 2023-03-20 ENCOUNTER — Other Ambulatory Visit: Payer: Self-pay | Admitting: Rheumatology

## 2023-03-20 ENCOUNTER — Ambulatory Visit: Payer: Medicare Other | Admitting: Nurse Practitioner

## 2023-03-20 DIAGNOSIS — M8000XA Age-related osteoporosis with current pathological fracture, unspecified site, initial encounter for fracture: Secondary | ICD-10-CM

## 2023-03-20 MED ORDER — TERIPARATIDE 600 MCG/2.4ML ~~LOC~~ SOPN: PEN_INJECTOR | SUBCUTANEOUS | 0 refills | Status: DC

## 2023-03-20 NOTE — Telephone Encounter (Signed)
Patient called stating she received a call from [facility] stating they need a new prescription for her Forteo medication.  Patient was told the prescription needs to be 3-4 month supply and cannot be faxed.  Prescription needs to be called into the pharmacy or can be sent electronically.

## 2023-03-20 NOTE — Telephone Encounter (Signed)
Last Fill: 10/16/2022  Labs: 12/10/2022 CBC and CMP WNL.   Next Visit: 06/17/2023  Last Visit: 12/10/2022  DX: Age-related osteoporosis with current pathological fracture, initial encounter   Current Dose per office note 12/10/2022: Forteo daily injections   Okay to refill Forteo?

## 2023-03-21 ENCOUNTER — Ambulatory Visit: Payer: Medicare Other | Admitting: Nurse Practitioner

## 2023-03-25 ENCOUNTER — Ambulatory Visit
Admission: RE | Admit: 2023-03-25 | Discharge: 2023-03-25 | Disposition: A | Discharge status: Home / Self Care | Payer: Medicare Other | Source: Ambulatory Visit | Attending: Family Medicine | Admitting: Family Medicine

## 2023-03-25 DIAGNOSIS — Z1231 Encounter for screening mammogram for malignant neoplasm of breast: Secondary | ICD-10-CM

## 2023-03-26 ENCOUNTER — Telehealth: Payer: Self-pay

## 2023-03-26 NOTE — Telephone Encounter (Signed)
I called patient, patient verbalized understanding. 

## 2023-03-26 NOTE — Telephone Encounter (Signed)
Patient called need refill for Forteo, [facility] pharmacy and was told they are waiting on approval. Would like a call back at  [phone]. Last injection was Friday 03/22/23

## 2023-03-29 ENCOUNTER — Other Ambulatory Visit: Payer: Self-pay | Admitting: Physician Assistant

## 2023-04-01 ENCOUNTER — Other Ambulatory Visit (HOSPITAL_COMMUNITY): Payer: Self-pay

## 2023-04-01 MED ORDER — ARMODAFINIL 250 MG PO TABS
250.0000 mg | ORAL_TABLET | Freq: Every day | ORAL | 0 refills | Status: DC | PRN
  Filled 2023-04-01: qty 30, 30d supply, fill #0

## 2023-04-01 NOTE — Telephone Encounter (Signed)
Last Fill: 02/12/2023  Next Visit: 06/17/2023  Last Visit: 12/10/2022  Dx: Other fatigue   Current Dose per office note on 12/10/2022: armodafinil 250 mg 1 tablet in the morning as needed for severe fatigue   Okay to refill Armodafinil?

## 2023-04-03 ENCOUNTER — Other Ambulatory Visit (HOSPITAL_COMMUNITY)
Admission: RE | Admit: 2023-04-03 | Discharge: 2023-04-03 | Disposition: A | Discharge status: Home / Self Care | Payer: Medicare Other | Source: Ambulatory Visit | Attending: Nurse Practitioner | Admitting: Nurse Practitioner

## 2023-04-03 ENCOUNTER — Encounter: Payer: Self-pay | Admitting: Nurse Practitioner

## 2023-04-03 ENCOUNTER — Ambulatory Visit (INDEPENDENT_AMBULATORY_CARE_PROVIDER_SITE_OTHER): Payer: Medicare Other | Admitting: Nurse Practitioner

## 2023-04-03 ENCOUNTER — Telehealth: Payer: Self-pay | Admitting: Adult Health

## 2023-04-03 VITALS — BP 110/78 | Ht 61.5 in | Wt 196.0 lb

## 2023-04-03 DIAGNOSIS — R87610 Atypical squamous cells of undetermined significance on cytologic smear of cervix (ASC-US): Secondary | ICD-10-CM

## 2023-04-03 DIAGNOSIS — Z01419 Encounter for gynecological examination (general) (routine) without abnormal findings: Secondary | ICD-10-CM

## 2023-04-03 DIAGNOSIS — Z124 Encounter for screening for malignant neoplasm of cervix: Secondary | ICD-10-CM | POA: Diagnosis not present

## 2023-04-03 DIAGNOSIS — M810 Age-related osteoporosis without current pathological fracture: Secondary | ICD-10-CM

## 2023-04-03 DIAGNOSIS — Z9189 Other specified personal risk factors, not elsewhere classified: Secondary | ICD-10-CM

## 2023-04-03 NOTE — Telephone Encounter (Signed)
Yes

## 2023-04-03 NOTE — Telephone Encounter (Signed)
Patient taking Ambien and it is helping with sleep. She stopped the hydroxyzine when starting the Ambien. She takes Xanax occasionally, taking at least 2 hours a part. She reports she took Ambien previously and she gets up to eat during the night. She said at the time she doesn't know what she is going, but later in the morning she knows what she did. Patient doesn't have F/U until 9/17. Should she be scheduled for an earlier appt?

## 2023-04-03 NOTE — Progress Notes (Signed)
EZZIE SLACK 1956/03/07 [id number]   History:  67 y.o. [name] presents for breast and pelvic exam. Postmenopausal - no HRT, no bleeding. Normal pap history. Started on vaginal estrogen last year after pap showed atrophic pattern with epithelial atypia. Used for short period and not consistently. Clinical osteoporosis - T-score -2.0, hx of vertebral fracture 01/2020, 06/2021. Forteo prescribed by rheumatology. Roux-en y in 2020. HLD, depression, fibromyalgia, migraines managed by PCP.   Gynecologic History Patient's last menstrual period was 07/27/2006.   Contraception: post menopausal status Sexually active: Yes  Health Maintenance Last Pap: 02/26/2022. Results were: Atrophic pattern with epithelial atypia Last mammogram: 03/25/2023. Results were: Normal Last colonoscopy: 07/16/2019. Results were: Normal Last Dexa: 07/12/2020. Results were: T-score -2.0 (hx of vertebral fx 2021, 2022)  Past medical history, past surgical history, family history and social history were all reviewed and documented in the EPIC chart. Married. Retired from [employer]. 2 children, 3 grandchildren.   ROS:  A ROS was performed and pertinent positives and negatives are included.  Exam:  Vitals:   04/03/23 1525  BP: 110/78  Weight: 196 lb (88.9 kg)  Height: 5' 1.5" (1.562 m)    Body mass index is 36.43 kg/m.  General appearance:  Normal Thyroid:  Symmetrical, normal in size, without palpable masses or nodularity. Respiratory  Auscultation:  Clear without wheezing or rhonchi Cardiovascular  Auscultation:  Regular rate, without rubs, murmurs or gallops  Edema/varicosities:  Not grossly evident Abdominal  Soft,nontender, without masses, guarding or rebound.  Liver/spleen:  No organomegaly noted  Hernia:  None appreciated  Skin  Inspection:  Grossly normal Breasts: Examined lying and sitting.   Right: Without masses, retractions, nipple discharge or axillary adenopathy.   Left: Without masses,  retractions, nipple discharge or axillary adenopathy. Genitourinary   Inguinal/mons:  Normal without inguinal adenopathy  External genitalia:  Normal appearing vulva with no masses, tenderness, or lesions  BUS/Urethra/Skene's glands:  Normal  Vagina:  Normal appearing with normal color and discharge, no lesions. Atrophic changes  Cervix:  Normal appearing without discharge or lesions  Uterus:  Normal in size, shape and contour.  Midline and mobile, nontender  Adnexa/parametria:     Rt: Normal in size, without masses or tenderness.   Lt: Normal in size, without masses or tenderness.  Anus and perineum: Normal  Digital rectal exam: Deferred  Patient informed chaperone available to be present for breast and pelvic exam. Patient has requested no chaperone to be present. Patient has been advised what will be completed during breast and pelvic exam.   Assessment/Plan:  67 y.o. [name] for breast and pelvic exam.   Encounter for breast and pelvic examination - Education provided on SBEs, importance of preventative screenings, current guidelines, high calcium diet, regular exercise, and multivitamin daily. Labs with PCP.   Postmenopausal - no HRT, no bleeding  Screening for cervical cancer - Plan: Cytology - PAP( [place]). Pap 02/2022 showed atrophic pattern with epithelial atypia. Vaginal estrogen recommended but used for short period and not consistently. Repeat pap today.   Age-related osteoporosis without current pathological fracture - T-score -2.0, has had multiple vertebral fractures. Started on Forteo last year by rheumatology. Plans to have DXA in August.   Screening for breast cancer - Normal mammogram history.  Continue annual screenings.  Normal breast exam today.  Screening for colon cancer - 2020 colonoscopy. Will repeat at GI's recommended interval.   Return in 2 years for breast and pelvic exam.     Carlene Bickley A  Loni Beckwith, 3:59 PM 04/03/2023

## 2023-04-03 NOTE — Telephone Encounter (Signed)
Please call to schedule an earlier appt with Gina.  

## 2023-04-03 NOTE — Telephone Encounter (Signed)
Pt LVM@ 11:16a.  She said she the latest medicine that Almira Coaster put her on for sleep is making her sleep better, but it's also making her so she can't "get going" in the morning.   Pls call back to discuss adjusting the dose.  Next appt 9/17

## 2023-04-04 NOTE — Telephone Encounter (Signed)
Pt has scheduled appt for Monday, 5/13 at 4:20p.  She said she tookd 3 hydroxyzine last night but she didn't sleep.  She is asking for someone to call her back and advise what she needs to do between now and her appt on Monday

## 2023-04-04 NOTE — Telephone Encounter (Signed)
LVM to RC 

## 2023-04-05 LAB — CYTOLOGY - PAP: Diagnosis: NEGATIVE

## 2023-04-07 NOTE — Telephone Encounter (Signed)
Has appt with 5/13.

## 2023-04-08 ENCOUNTER — Other Ambulatory Visit: Payer: Self-pay | Admitting: Adult Health

## 2023-04-08 ENCOUNTER — Ambulatory Visit: Payer: Medicare Other | Admitting: Adult Health

## 2023-04-08 DIAGNOSIS — F411 Generalized anxiety disorder: Secondary | ICD-10-CM

## 2023-04-08 DIAGNOSIS — F331 Major depressive disorder, recurrent, moderate: Secondary | ICD-10-CM

## 2023-04-08 NOTE — Telephone Encounter (Signed)
Has appt with Gina today.  

## 2023-04-19 ENCOUNTER — Ambulatory Visit: Payer: Medicare Other | Admitting: Adult Health

## 2023-04-19 ENCOUNTER — Encounter: Payer: Self-pay | Admitting: Adult Health

## 2023-04-19 DIAGNOSIS — F3181 Bipolar II disorder: Secondary | ICD-10-CM

## 2023-04-19 DIAGNOSIS — G4700 Insomnia, unspecified: Secondary | ICD-10-CM | POA: Diagnosis not present

## 2023-04-19 DIAGNOSIS — F411 Generalized anxiety disorder: Secondary | ICD-10-CM | POA: Diagnosis not present

## 2023-04-19 MED ORDER — ALPRAZOLAM 0.25 MG PO TABS
0.2500 mg | ORAL_TABLET | Freq: Three times a day (TID) | ORAL | 2 refills | Status: DC | PRN
Start: 2023-04-19 — End: 2023-08-21

## 2023-04-19 MED ORDER — HYDROXYZINE HCL 25 MG PO TABS
ORAL_TABLET | ORAL | 3 refills | Status: DC
Start: 2023-04-19 — End: 2023-07-03

## 2023-04-19 NOTE — Progress Notes (Signed)
[name] [id number] 28-Dec-1955 67 y.o.  Subjective:   Patient ID:  [name] is a 67 y.o. (DOB Apr 08, 1956) female.  Chief Complaint: No chief complaint on file.   HPI [name] presents to the office today for follow-up of  anxiety, depression, and insomnia.  Describes mood today as "ok". Pleasant. Mood symptoms - reports depression and anxiety - "sometimes". stressors". Reports some irritability. Reports some worry, rumination and over thinking. Mood is consistent. Stating "I feel like I'm doing ok, still struggling". Feels like medications are helpful. Stable interest and motivation. Taking medications as prescribed.  Energy levels lower. Active, does not have a regular exercise. Enjoys some usual interests and activities. Married. Lives with husband. Has 2 sons - one local and another one in [place]. Mother local - 77. Spending time with family. Appetite adequate. Weight gain. Sleeps better some nights than others. Averages 4 to 5 hours. Reports occasional daytime napping. Using CPAP machine. Focus and concentration stable. Completing tasks. Managing aspects of household. Retired. Denies SI or HI.  Denies AH or VH Denies self harm. Denies substance use.  Bariatric surgery 11/20.  PHQ2-9    Flowsheet Row Office Visit from 11/05/2022 in [facility] for Infectious Disease Most recent reading at 11/05/2022  9:32 AM Office Visit from 09/25/2022 in [facility] for Infectious Disease Most recent reading at 09/25/2022  9:38 AM Office Visit from 07/26/2022 in [facility] Most recent reading at 07/26/2022  2:41 PM Video Visit from 07/26/2022 in [facility] for Infectious Disease Most recent reading at 07/26/2022  9:13 AM Office Visit from 07/24/2022 in [facility] for Infectious Disease Most recent reading at 07/24/2022  2:28 PM  PHQ-2 Total Score 1 0 2 1 0  PHQ-9 Total Score -- -- 9 -- --       Flowsheet Row ED to Hosp-Admission (Discharged) from 07/30/2022 in [facility] from 12/31/2021 in [facility] at [facility] ED from 07/04/2021 in [facility] Department at [facility]  C-SSRS RISK CATEGORY No Risk No Risk No Risk        Review of Systems:  Review of Systems  Musculoskeletal:  Negative for gait problem.  Neurological:  Negative for tremors.  Psychiatric/Behavioral:         Please refer to HPI    Medications: I have reviewed the patient's current medications.  Current Outpatient Medications  Medication Sig Dispense Refill   hydrOXYzine (ATARAX) 25 MG tablet Take one to four tablets at bedtime. 120 tablet 3   acetaminophen (TYLENOL) 325 MG tablet Take 2 tablets (650 mg total) by mouth every 4 (four) hours as needed for mild pain or moderate pain (or Fever >/= 101).     albuterol (VENTOLIN HFA) 108 (90 Base) MCG/ACT inhaler TAKE 2 PUFFS BY MOUTH EVERY 6 HOURS AS NEEDED FOR WHEEZE OR SHORTNESS OF BREATH 8.5 each 0   allopurinol (ZYLOPRIM) 300 MG tablet TAKE 1 TABLET BY MOUTH EVERY DAY 90 tablet 1   ALPRAZolam (XANAX) 0.25 MG tablet Take 1 tablet (0.25 mg total) by mouth 3 (three) times daily as needed for anxiety. 90 tablet 2   ARIPiprazole (ABILIFY) 5 MG tablet TAKE 1 TABLET (5 MG TOTAL) BY MOUTH DAILY. 90 tablet 0   Armodafinil 250 MG tablet Take 1 tablet (250 mg total) by mouth daily as needed. 30 tablet 0   aspirin EC 81 MG tablet Take 81 mg by  mouth in the morning. Swallow whole.     atorvastatin (LIPITOR) 40 MG tablet Take 1 tablet (40 mg total) by mouth daily. 90 tablet 3   CALCIUM PO Take 500 mg by mouth in the morning, at noon, and at bedtime.     cetirizine (ZYRTEC) 10 MG tablet Take 10 mg by mouth at bedtime.     colchicine 0.6 MG tablet Take 1 tablet (0.6 mg total) by mouth as needed. 30 tablet 0   DULoxetine (CYMBALTA) 60 MG capsule Take 1 capsule (60 mg total) by mouth 2 (two) times daily.  (Patient taking differently: Take 60 mg by mouth in the morning and at bedtime.) 180 capsule 3   fluticasone (FLONASE) 50 MCG/ACT nasal spray Place 2 sprays into both nostrils daily. 16 g 11   gabapentin (NEURONTIN) 300 MG capsule Take 300 mg by mouth 4 (four) times daily.     Insulin Pen Needle (B-D UF III MINI PEN NEEDLES) 31G X 5 MM MISC USE TO INJECT FORTEO ONCE DAILY. DISCARD AFTER USE. 100 each 2   meclizine (ANTIVERT) 25 MG tablet Take 1 tablet (25 mg total) by mouth 3 (three) times daily as needed for dizziness. 30 tablet 0   methocarbamol (ROBAXIN) 500 MG tablet TAKE 1 TABLET BY MOUTH EVERY DAY AS NEEDED 30 tablet 2   metoprolol tartrate (LOPRESSOR) 50 MG tablet TAKE 1 TABLET BY MOUTH TWICE A DAY 180 tablet 1   Multiple Vitamins-Minerals (PRESERVISION AREDS 2) CAPS Take 1 capsule by mouth in the morning and at bedtime.     ondansetron (ZOFRAN) 8 MG tablet Take 8 mg by mouth every 8 (eight) hours as needed for nausea.  0   OXcarbazepine (TRILEPTAL) 150 MG tablet Take 150 mg by mouth in the morning and at bedtime.     Oyster Shell Calcium 500 MG TABS Take by mouth.     pantoprazole (PROTONIX) 40 MG tablet Take 1 tablet (40 mg total) by mouth daily. (Patient taking differently: Take 40 mg by mouth at bedtime.) 90 tablet 0   PRESCRIPTION MEDICATION CPAP- At bedtime     Probiotic Product (PROBIOTIC ADVANCED PO) Take 1 capsule by mouth at bedtime.     promethazine (PHENERGAN) 25 MG tablet Take 25 mg by mouth every 6 (six) hours as needed for nausea or vomiting.     RESTASIS 0.05 % ophthalmic emulsion Place 1 drop into both eyes in the morning and at bedtime.     SYSTANE ULTRA PF 0.4-0.3 % SOLN Place 1 drop into both eyes 3 (three) times daily as needed (for dryness).     Teriparatide, Recombinant, (FORTEO) 600 MCG/2.4ML SOPN INJECT UNDER THE SKIN ONCE DAILY 7.2 mL 0   traMADol (ULTRAM) 50 MG tablet Take 50 mg by mouth 3 (three) times daily as needed.     No current facility-administered  medications for this visit.    Medication Side Effects: None  Allergies:  Allergies  Allergen Reactions   Erythromycin Nausea Only and Other (See Comments)    Abdominal pain, also   Meperidine Hcl Other (See Comments)    Made the patient feel hot and her heart raced- made her feel "flushed," also   Polyethyl Glyc-Propyl Glyc Pf Nausea And Vomiting and Other (See Comments)    RN confirmed with patient 9/4 (Miralax)   Polyethylene Glycol 3350 Other (See Comments)    Other Reaction(s): GI Intolerance  RN confirmed with patient 9/4 (Miralax)   Zolpidem Other (See Comments)    Lost  balance   Dilaudid [Hydromorphone Hcl] Itching    Past Medical History:  Diagnosis Date   Anemia    previously followed by Dr. Dalene Carrow for anemia and elevated platelets   Anxiety    C. difficile colitis 10/01/2012   treated by [facility]   Chronic fatigue syndrome    Closed wedge compression fracture of T8 vertebra (HCC) 06/2021   DDD (degenerative disc disease), lumbar 08/19/2014   and facet arthroplasty & left lumbar radiculopathy (Dr.Ramos)   Depression    Dyssynergia    dyssynergenic defecation, contributing to fecal incontinence.   Edema    Fibromyalgia    Gastroparesis    followed at [facility]   GERD (gastroesophageal reflux disease)    History of kidney stones    History of vertebral fracture 06/30/2021   Hyperlipidemia    Kidney stone    Lumbar radiculopathy    Migraine    Neuropathy    Obstructive sleep apnea    Does  wear  CPAP   Osteoporosis    Paresthesia    Dr. Antonietta Barcelona at [facility]   Pelvic floor dysfunction    pelvic floor dyssynergy   Plantar fasciitis 02/2011   R foot   Pneumonia    2012   PONV (postoperative nausea and vomiting)    pt states has gastroparesis has difficulty taking antibiotics and narcotics has severe nausea and vomiting    Restless leg syndrome    S/P endometrial ablation 08/09/2006   Novasure Ablation   S/P epidural steroid injection 09/20/2014    Dr.Ramos   Tremor    Dr. Antonietta Barcelona   Urinary frequency    Urinary incontinence     Past Medical History, Surgical history, Social history, and Family history were reviewed and updated as appropriate.   Please see review of systems for further details on the patient's review from today.   Objective:   Physical Exam:  LMP 07/27/2006   Physical Exam Constitutional:      General: She is not in acute distress. Cardiovascular:     Heart sounds:     No gallop.  Musculoskeletal:        General: No deformity.  Neurological:     Mental Status: She is alert and oriented to person, place, and time.     Coordination: Coordination normal.  Psychiatric:        Attention and Perception: Attention and perception normal. She does not perceive auditory or visual hallucinations.        Mood and Affect: Mood normal. Mood is not anxious or depressed. Affect is not labile, blunt, angry or inappropriate.        Speech: Speech normal.        Behavior: Behavior normal.        Thought Content: Thought content normal. Thought content is not paranoid or delusional. Thought content does not include homicidal or suicidal ideation. Thought content does not include homicidal or suicidal plan.        Cognition and Memory: Cognition and memory normal.        Judgment: Judgment normal.     Comments: Insight intact     Lab Review:     Component Value Date/Time   NA 142 12/10/2022 0954   NA 141 02/05/2022 1204   K 4.7 12/10/2022 0954   CL 103 12/10/2022 0954   CO2 31 12/10/2022 0954   GLUCOSE 100 (H) 12/10/2022 0954   BUN 21 12/10/2022 0954   BUN 17 02/05/2022 1204   CREATININE  0.92 12/10/2022 0954   CALCIUM 9.5 12/10/2022 0954   PROT 6.8 12/10/2022 0954   PROT 6.5 02/05/2022 1204   ALBUMIN 3.3 (L) 07/31/2022 0317   ALBUMIN 4.0 02/05/2022 1204   AST 21 12/10/2022 0954   ALT 18 12/10/2022 0954   ALKPHOS 139 (H) 07/31/2022 0317   BILITOT 0.2 12/10/2022 0954   BILITOT <0.2 02/05/2022 1204    GFRNONAA >60 08/05/2022 0326   GFRNONAA 68 07/04/2020 1129   GFRAA 81 11/07/2020 1029   GFRAA 79 07/04/2020 1129       Component Value Date/Time   WBC 9.2 12/10/2022 0954   RBC 4.57 12/10/2022 0954   HGB 13.2 12/10/2022 0954   HGB 12.5 07/17/2022 1614   HGB 11.7 05/02/2011 1412   HCT 39.9 12/10/2022 0954   HCT 38.0 07/17/2022 1614   HCT 37.1 05/02/2011 1412   PLT 364 12/10/2022 0954   PLT 431 07/17/2022 1614   MCV 87.3 12/10/2022 0954   MCV 91 07/17/2022 1614   MCV 94.4 05/02/2011 1412   MCH 28.9 12/10/2022 0954   MCHC 33.1 12/10/2022 0954   RDW 14.1 12/10/2022 0954   RDW 13.2 07/17/2022 1614   RDW 18.3 (H) 05/02/2011 1412   LYMPHSABS 2,401 12/10/2022 0954   LYMPHSABS 3.2 (H) 07/17/2022 1614   LYMPHSABS 2.4 05/02/2011 1412   MONOABS 0.9 08/02/2022 0341   MONOABS 0.8 05/02/2011 1412   EOSABS 202 12/10/2022 0954   EOSABS 0.3 07/17/2022 1614   BASOSABS 110 12/10/2022 0954   BASOSABS 0.1 07/17/2022 1614   BASOSABS 0.1 05/02/2011 1412    No results found for: "POCLITH", "LITHIUM"   No results found for: "PHENYTOIN", "PHENOBARB", "VALPROATE", "CBMZ"   .res Assessment: Plan:    Plan:  Xanax 0.25mg  TID Cymbalta 60mg  BID Abilify 5mg  daily Hydroxyzine 25mg  - take one to four tablets at hs  D/C Ambien 5mg   RTC 3 months  Patient advised to contact office with any questions, adverse effects, or acute worsening in signs and symptoms.  Discussed potential metabolic side effects associated with atypical antipsychotics, as well as potential risk for movement side effects. Advised pt to contact office if movement side effects occur.   Time spent with patient was 25 minutes. Greater than 50% of face to face time with patient was spent on counseling and coordination of care.    Diagnoses and all orders for this visit:  Bipolar II disorder (HCC)  Insomnia, unspecified type -     ALPRAZolam (XANAX) 0.25 MG tablet; Take 1 tablet (0.25 mg total) by mouth 3 (three) times  daily as needed for anxiety. -     hydrOXYzine (ATARAX) 25 MG tablet; Take one to four tablets at bedtime.  Generalized anxiety disorder     Please see After Visit Summary for patient specific instructions.  Future Appointments  Date Time Provider Department Center  06/17/2023  9:40 AM Pollyann Savoy, MD [facility] None  08/13/2023 10:40 AM Vanette Noguchi, Thereasa Solo, NP [facility] None  08/22/2023  8:45 AM Joselyn Arrow, MD [facility]    No orders of the defined types were placed in this encounter.   -------------------------------

## 2023-04-29 DIAGNOSIS — R112 Nausea with vomiting, unspecified: Secondary | ICD-10-CM | POA: Diagnosis not present

## 2023-05-02 DIAGNOSIS — R112 Nausea with vomiting, unspecified: Secondary | ICD-10-CM | POA: Diagnosis not present

## 2023-05-02 DIAGNOSIS — Z9884 Bariatric surgery status: Secondary | ICD-10-CM | POA: Diagnosis not present

## 2023-05-06 ENCOUNTER — Telehealth: Payer: Self-pay | Admitting: Adult Health

## 2023-05-06 MED ORDER — TRAZODONE HCL 50 MG PO TABS: 50.0000 mg | ORAL_TABLET | Freq: Every day | ORAL | 0 refills | Status: DC

## 2023-05-06 NOTE — Telephone Encounter (Signed)
Patient reporting taking 4 hydroxyzine along with alprazolam and still not sleeping. She reports sleeping every 3-4 days, probably because she is so exhausted.   Has tried: Alprazolam Clonazepam Lunesta Nortriptyline Ambien

## 2023-05-06 NOTE — Telephone Encounter (Signed)
Trazadone 50mg  or Seroquel 25mg  - 1 to 2 at hs.

## 2023-05-06 NOTE — Telephone Encounter (Signed)
Pt left message, stating still having issues with sleep med. RTC [phone]. Now drys her eyes out and already has issues with eyes.   Apt 9/17

## 2023-05-06 NOTE — Telephone Encounter (Signed)
Trazodone sent to requested pharmacy.

## 2023-05-08 DIAGNOSIS — Z9884 Bariatric surgery status: Secondary | ICD-10-CM | POA: Diagnosis not present

## 2023-05-08 DIAGNOSIS — R112 Nausea with vomiting, unspecified: Secondary | ICD-10-CM | POA: Diagnosis not present

## 2023-05-20 ENCOUNTER — Other Ambulatory Visit: Payer: Self-pay | Admitting: Physician Assistant

## 2023-05-20 ENCOUNTER — Other Ambulatory Visit (HOSPITAL_COMMUNITY): Payer: Self-pay

## 2023-05-20 ENCOUNTER — Other Ambulatory Visit: Payer: Self-pay | Admitting: Adult Health

## 2023-05-20 MED ORDER — ARMODAFINIL 250 MG PO TABS
250.0000 mg | ORAL_TABLET | Freq: Every day | ORAL | 0 refills | Status: DC | PRN
  Filled 2023-05-20: qty 30, 30d supply, fill #0

## 2023-05-20 NOTE — Telephone Encounter (Signed)
Last Fill: 04/01/2023  Next Visit: 06/17/2023  Last Visit: 12/10/2022  DX: Other fatigue:   Current Dose per office note on 12/10/2022: Patient would like to restart on armodafinil as needed basis when she is experiencing significant fatigue. A new prescription will be sent to the pharmacy today written for 250 mg 1 tablet in the morning as needed for severe fatigue.   Okay to refill armodafinil?

## 2023-05-20 NOTE — Telephone Encounter (Signed)
Last Fill: 09/12/2022  Next Visit: 06/17/2023  Last Visit: 12/10/2022  Dx: Trapezius muscle spasm   Current Dose per office note on 12/10/2022: not mentioned  Okay to refill Methocarbamol?

## 2023-05-24 DIAGNOSIS — Z9884 Bariatric surgery status: Secondary | ICD-10-CM | POA: Diagnosis not present

## 2023-05-24 DIAGNOSIS — R109 Unspecified abdominal pain: Secondary | ICD-10-CM | POA: Diagnosis not present

## 2023-05-28 ENCOUNTER — Other Ambulatory Visit: Payer: Self-pay | Admitting: Adult Health

## 2023-05-28 DIAGNOSIS — F411 Generalized anxiety disorder: Secondary | ICD-10-CM

## 2023-05-28 DIAGNOSIS — F331 Major depressive disorder, recurrent, moderate: Secondary | ICD-10-CM

## 2023-06-02 ENCOUNTER — Other Ambulatory Visit: Payer: Self-pay | Admitting: Rheumatology

## 2023-06-02 DIAGNOSIS — M8000XA Age-related osteoporosis with current pathological fracture, unspecified site, initial encounter for fracture: Secondary | ICD-10-CM

## 2023-06-03 ENCOUNTER — Other Ambulatory Visit: Payer: Self-pay | Admitting: Adult Health

## 2023-06-03 NOTE — Telephone Encounter (Signed)
Last Fill: 03/20/2023  Labs: 04/29/2023 glucose 115, alkaline phosphatase 118  Next Visit: 06/17/2023  Last Visit: 12/10/2022  DX: Age-related osteoporosis with current pathological fracture   Current Dose per office note on 12/10/2022: Forteo started 08/07/21.  She remains on Forteo daily injections without any side effects or injection site reactions.   Okay to refill forteo?

## 2023-06-04 NOTE — Telephone Encounter (Signed)
Pt also called requesting RF on Trazodone.

## 2023-06-04 NOTE — Telephone Encounter (Signed)
Pt called at 4:48p stating pharmacy has not received refill of Trazadone.  She is out of meds today.  Pls send to [facility]  Next appt 9/17

## 2023-06-04 NOTE — Progress Notes (Signed)
Office Visit Note  Patient: [name]             Date of Birth: [birth year]           MRN: [id number]             PCP: Joselyn Arrow, MD Referring: Joselyn Arrow, MD Visit Date: 06/17/2023 Occupation: @GUAROCC @  Subjective:  Medication management  History of Present Illness: [name] is a 67 y.o. female with gouty arthropathy, osteoarthritis and osteoporosis.  Patient states she has not had any gout flares.  She has been taking allopurinol 300 mg daily without any interruption.  She continues to have a stiffness in her hands, knees and her hips.  Her bilateral knee joints are replaced.  She has an appointment coming up with orthopedics.  She continues to have some fullness in her right toe without any infection.  She had no recurrence of Achilles tendinitis in a long time.  She continues to have some discomfort in her cervical spine especially in the trapezius region.  She has intermittent discomfort in her lower back for which she is followed by orthopedics.  She has been taking Forteo injections since August 07, 2021.  She had an interruption of about 3 months in the beginning.  She refinishing Forteo injections in 3 months.  Her last DEXA scan was on July 12, 2020.  Takes vitamin D on a regular basis.  She states she has gastroesophageal reflux and she has to take pantoprazole.    Activities of Daily Living:  Patient reports morning stiffness for 0 minutes.   Patient Reports nocturnal pain.  Difficulty dressing/grooming: Denies Difficulty climbing stairs: Denies Difficulty getting out of chair: Denies Difficulty using hands for taps, buttons, cutlery, and/or writing: Denies  Review of Systems  Constitutional:  Positive for fatigue.  HENT:  Positive for mouth dryness. Negative for mouth sores.   Eyes:  Positive for dryness.  Respiratory:  Positive for shortness of breath.        With exertion, relates to weight gain  Cardiovascular:  Negative for chest pain and palpitations.   Gastrointestinal:  Positive for constipation and diarrhea. Negative for blood in stool.  Endocrine: Positive for increased urination.  Genitourinary:  Negative for painful urination and involuntary urination.  Musculoskeletal:  Positive for myalgias, muscle tenderness and myalgias. Negative for joint pain, gait problem, joint pain, joint swelling, muscle weakness and morning stiffness.  Skin:  Negative for color change, rash, hair loss and sensitivity to sunlight.  Allergic/Immunologic: Negative for susceptible to infections.  Neurological:  Positive for headaches. Negative for dizziness.  Hematological:  Negative for swollen glands.  Psychiatric/Behavioral:  Positive for sleep disturbance. Negative for depressed mood. The patient is nervous/anxious.     PMFS History:  Patient Active Problem List   Diagnosis Date Noted   Skin ulcer of left great toe, limited to breakdown of skin (HCC) 09/25/2022   Medication monitoring encounter 08/29/2022   History of Clostridioides difficile colitis    Infected orthopedic implant (HCC)    Charcot's arthropathy of forefoot    Right foot pain 07/31/2022   Osteomyelitis of great toe of right foot (HCC) 07/30/2022   Bipolar disorder (HCC) 07/30/2022   Osteopenia 08/04/2021   Acute right-sided weakness 01/22/2021   Peripheral polyneuropathy 01/22/2021   History of migraine headaches 01/22/2021   Difficulty with speech    Gastric bypass status for obesity 09/29/2019   Aftercare 08/12/2018   Pain in left knee 06/03/2018   Dyspnea  12/26/2017   Restrictive lung disease secondary to obesity 12/26/2017   Atypical chest pain 12/06/2017   Sinus tachycardia 12/06/2017   Chest pain 12/06/2017   DJD (degenerative joint disease), cervical 12/24/2016   Primary osteoarthritis of both hips 12/24/2016   Primary osteoarthritis of both knees 12/24/2016   H/O total knee replacement, right 12/24/2016   Spondylosis of lumbar region without myelopathy or  radiculopathy 12/24/2016   Lumbosacral spondylosis without myelopathy 12/24/2016   Acute gout 05/30/2016   Gout 05/30/2016   Myalgia 03/29/2016   Other long term (current) drug therapy 03/29/2016   Idiopathic peripheral neuropathy 03/29/2016   Cannot sleep 03/29/2016   Migraine without aura and responsive to treatment 03/29/2016   Multifocal myoclonus 03/29/2016   Restless leg 03/29/2016   Has a tremor 03/29/2016   History of aspiration pneumonitis 01/25/2016   History of acute bronchitis 01/25/2016   LPRD (laryngopharyngeal reflux disease) 01/25/2016   Imbalance 01/09/2016   Serotonin syndrome 12/22/2015   Essential hypertension 09/10/2015   OA (osteoarthritis) of knee 09/05/2015   Obesity 05/17/2015   OSA (obstructive sleep apnea) 03/16/2013   Iron deficiency 11/06/2012   Thrombocythemia 11/06/2012   Leukocytosis 11/05/2012   Impaired fasting glucose 07/23/2012   Bronchitis 04/03/2012   Kidney stone on left side 03/05/2012   Kidney cysts 03/04/2012   Loose stools 03/04/2012   Urinary frequency 12/04/2011   Restless leg syndrome 12/04/2011   Polypharmacy 12/04/2011   Bladder incontinence 12/04/2011   Fibromyalgia    Mixed hyperlipidemia    Edema    S/P endometrial ablation    Allergic rhinitis 09/18/2011   Depression, major, single episode, in partial remission (HCC) 04/21/2011   PRECORDIAL PAIN 02/17/2010   Anxiety state 01/30/2010   Migraine 01/30/2010   GERD without esophagitis 01/30/2010   Gastroparesis 01/30/2010    Past Medical History:  Diagnosis Date   Anemia    previously followed by Dr. Dalene Carrow for anemia and elevated platelets   Anxiety    C. difficile colitis 10/01/2012   treated by [facility]   Chronic fatigue syndrome    Closed wedge compression fracture of T8 vertebra (HCC) 06/2021   DDD (degenerative disc disease), lumbar 08/19/2014   and facet arthroplasty & left lumbar radiculopathy (Dr.Ramos)   Depression    Dyssynergia    dyssynergenic  defecation, contributing to fecal incontinence.   Edema    Fibromyalgia    Gastroparesis    followed at [facility]   GERD (gastroesophageal reflux disease)    History of kidney stones    History of vertebral fracture 06/30/2021   Hyperlipidemia    Kidney stone    Lumbar radiculopathy    Migraine    Neuropathy    Obstructive sleep apnea    Does  wear  CPAP   Osteoporosis    Paresthesia    Dr. Antonietta Barcelona at [facility]   Pelvic floor dysfunction    pelvic floor dyssynergy   Plantar fasciitis 02/2011   R foot   Pneumonia    2012   PONV (postoperative nausea and vomiting)    pt states has gastroparesis has difficulty taking antibiotics and narcotics has severe nausea and vomiting    Restless leg syndrome    S/P endometrial ablation 08/09/2006   Novasure Ablation   S/P epidural steroid injection 09/20/2014   Dr.Ramos   Tremor    Dr. Antonietta Barcelona   Urinary frequency    Urinary incontinence     Family History  Problem Relation Age of Onset  Allergies Mother    Hypertension Mother    Heart disease Mother        possible valve problem - leaking valve   Macular degeneration Mother    Macular degeneration Father    Heart disease Father        pacemaker, CHF   Hypertension Father    Diabetes Father        borderline   Stroke Father 24   Kidney disease Father    Allergies Sister    Asthma Sister    Irritable bowel syndrome Sister    Macular degeneration Sister    Heart disease Maternal Grandmother    Heart disease Maternal Grandfather    Heart disease Paternal Grandmother    Heart disease Paternal Grandfather    Cancer Maternal Aunt        leukemia   Cancer Maternal Aunt    Colon cancer Maternal Aunt        late 60's   CAD Neg Hx    Past Surgical History:  Procedure Laterality Date   CHOLECYSTECTOMY  07/2004   ENDOMETRIAL ABLATION  08/09/2006   Dr. Kingsley Plan Ablation   FACET JOINT INJECTION  04/17/2017   Left L4-5 and L5-S1   FOOT SURGERY Right 06/29/2022    FUSION OF JOINT IN BIG TOE RT FOOT, Dr. Lilian Kapur   GASTRIC ROUX-EN-Y N/A 09/29/2019   Procedure: LAPAROSCOPIC ROUX-EN-Y GASTRIC BYPASS WITH UPPER ENDOSCOPY, ERAS Pathway;  Surgeon: Luretha Murphy, MD;  Location: [facility];  Service: General;  Laterality: N/A;   IRRIGATION AND DEBRIDEMENT FOOT Right 08/03/2022   Procedure: foot bone biopsy and hardware removal;  Surgeon: Edwin Cap, DPM;  Location: [facility];  Service: Podiatry;  Laterality: Right;   KNEE ARTHROPLASTY     KNEE SURGERY  1999   R knee, Dr. Renae Fickle, torn cartilage   KYPHOPLASTY  11/06/2021   [id number]   RETINAL LASER PROCEDURE Right 08/28/2018   laser retinopexy   RIGHT/LEFT HEART CATH AND CORONARY ANGIOGRAPHY N/A 01/01/2018   Procedure: RIGHT/LEFT HEART CATH AND CORONARY ANGIOGRAPHY;  Surgeon: Tonny Bollman, MD;  Location: [facility] INVASIVE CV LAB;  Service: Cardiovascular;  Laterality: N/A;   TONSILLECTOMY  1968   TONSILLECTOMY     TOTAL KNEE ARTHROPLASTY Right 09/05/2015   Procedure: RIGHT TOTAL KNEE ARTHROPLASTY;  Surgeon: Ollen Gross, MD;  Location: [facility];  Service: Orthopedics;  Laterality: Right;   TOTAL KNEE ARTHROPLASTY Left 07/14/2018   Procedure: LEFT TOTAL KNEE ARTHROPLASTY;  Surgeon: Ollen Gross, MD;  Location: [facility];  Service: Orthopedics;  Laterality: Left;   ULTRASOUND GUIDANCE FOR VASCULAR ACCESS  01/01/2018   Procedure: Ultrasound Guidance For Vascular Access;  Surgeon: Tonny Bollman, MD;  Location: [facility]VASIVE CV LAB;  Service: Cardiovascular;;   UMBILICAL HERNIA REPAIR N/A 09/29/2019   Procedure: HERNIA REPAIR UMBILICAL ADULT;  Surgeon: Luretha Murphy, MD;  Location: [facility];  Service: General;  Laterality: N/A;   Social History   Social History Narrative   Married, 1 son in [place] (grandson born 06/2017), 1 stepson in [place], with 2 children      Updated 06/2022   Immunization History  Administered Date(s) Administered   COVID-19, mRNA, vaccine(Comirnaty)12 years and older 01/31/2023    Fluad Quad(high Dose 65+) 09/06/2022   Influenza Inj Mdck Quad Pf 09/13/2019   Influenza Split 09/17/2011, 09/03/2012, 09/04/2013   Influenza,inj,Quad PF,6+ Mos 08/16/2014, 07/27/2015, 07/31/2017, 08/13/2018, 07/28/2020   Influenza-Unspecified 10/04/2016, 07/31/2017, 09/23/2017, 09/18/2021   PFIZER Comirnaty(Gray Top)Covid-19 Tri-Sucrose Vaccine 06/01/2021   PFIZER(Purple Top)SARS-COV-2 Vaccination  02/20/2020, 03/12/2020, 09/20/2020   PNEUMOCOCCAL CONJUGATE-20 07/26/2022   Pfizer Covid-19 Vaccine Bivalent Booster 34yrs & up 02/05/2022   Tdap 01/16/2011, 06/20/2017   Zoster Recombinant(Shingrix) 02/05/2019, 07/14/2019     Objective: Vital Signs: BP 128/81 (BP Location: Left Arm, Patient Position: Sitting, Cuff Size: Normal)   Pulse 67   Resp 15   Ht 5\' 2"  (1.575 m)   Wt 197 lb 12.8 oz (89.7 kg)   LMP 07/27/2006   BMI 36.18 kg/m    Physical Exam Vitals and nursing note reviewed.  Constitutional:      Appearance: She is well-developed.  HENT:     Head: Normocephalic and atraumatic.  Eyes:     Conjunctiva/sclera: Conjunctivae normal.  Cardiovascular:     Rate and Rhythm: Normal rate and regular rhythm.     Heart sounds: Normal heart sounds.  Pulmonary:     Effort: Pulmonary effort is normal.     Breath sounds: Normal breath sounds.  Abdominal:     General: Bowel sounds are normal.     Palpations: Abdomen is soft.  Musculoskeletal:     Cervical back: Normal range of motion.  Lymphadenopathy:     Cervical: No cervical adenopathy.  Skin:    General: Skin is warm and dry.     Capillary Refill: Capillary refill takes less than 2 seconds.  Neurological:     Mental Status: She is alert and oriented to person, place, and time.  Psychiatric:        Behavior: Behavior normal.      Musculoskeletal Exam: She had limited lateral rotation of the cervical spine without discomfort.  Thoracic kyphosis was noted.  There was no discomfort on palpation of the lumbar spine.  Shoulder  joints, elbow joints, wrist joints, MCPs PIPs and DIPs been good range of motion.  Mild DIP and PIP prominence was noted.  Hip joints were in good range of motion.  Knee joints were in good range of motion without any warmth swelling or effusion.  There was no tenderness over ankles or MTPs.  CDAI Exam: CDAI Score: -- Patient Global: --; Provider Global: -- Swollen: --; Tender: -- Joint Exam 06/17/2023   No joint exam has been documented for this visit   There is currently no information documented on the homunculus. Go to the Rheumatology activity and complete the homunculus joint exam.  Investigation: No additional findings.  Imaging: No results found.  Recent Labs: Lab Results  Component Value Date   WBC 9.2 12/10/2022   HGB 13.2 12/10/2022   PLT 364 12/10/2022   NA 142 12/10/2022   K 4.7 12/10/2022   CL 103 12/10/2022   CO2 31 12/10/2022   GLUCOSE 100 (H) 12/10/2022   BUN 21 12/10/2022   CREATININE 0.92 12/10/2022   BILITOT 0.2 12/10/2022   ALKPHOS 139 (H) 07/31/2022   AST 21 12/10/2022   ALT 18 12/10/2022   PROT 6.8 12/10/2022   ALBUMIN 3.3 (L) 07/31/2022   CALCIUM 9.5 12/10/2022   GFRAA 81 11/07/2020    Speciality Comments: Forteo started 08/07/21 (first dose in clinic)  Procedures:  No procedures performed Allergies: Erythromycin, Meperidine hcl, Polyethyl glyc-propyl glyc pf, Polyethylene glycol 3350, Zolpidem, and Dilaudid [hydromorphone hcl]   Assessment / Plan:     Visit Diagnoses: Idiopathic chronic gout of multiple sites without tophus -patient denies any flares of gout.  She has been taking allopurinol 300 mg 1 tablet by mouth daily prescribed by her PCP.  Uric acid was 5.9 on December 10, 2022.  The goal is to keep uric acid below 6.0.  Dietary modifications were discussed with the patient.  Patient did not have to take colchicine since the last visit.  Medication monitoring encounter-I will check CBC with differential, CMP with GFR and uric acid  today.  Primary osteoarthritis of both hands-she has mild PIP and DIP thickening.  She denies any discomfort today.  No synovitis was noted.  Primary osteoarthritis of both hips-she has off-and-on discomfort in the hip joints.  She had good range of motion without discomfort today.  History of total knee replacement, bilateral - 2016, 2019 by Dr. Despina Hick.  She continues to have pain in her bilateral knee joints.  She has an appointment coming up with the orthopedics.  No warmth swelling or effusion was noted.  Abrasion of skin - Right great toe-History of osteomyelitis.  Completed IV antibiotics on 09/14/22. Under care of Dr Elinor Parkinson.  She had no ulceration on the examination.  Thickening of the great toe was noted.  Primary osteoarthritis of both feet-no tenderness was noted on the examination today.  Achilles tendinitis, right leg-resolved.  DDD (degenerative disc disease), cervical-she continues to have some stiffness in her cervical spine especially with the trapezius region.  Range of motion exercises were advised.  DDD (degenerative disc disease), lumbar -she has intermittent pain in the lumbar spine.  She has been following up with Dr. Jillyn Hidden at [facility].  She takes methocarbamol on as needed basis.   Fibromyalgia-she continues to have generalized pain and discomfort from fibromyalgia.  She takes methocarbamol as needed.  Need for regular exercise and stretching was emphasized.  Other fatigue-related to fibromyalgia.  She restarted Nuvigil to 50 mg p.o. every morning in January 2024 due to ongoing severe fatigue.  She is able to function better with Nuvigil.  Trapezius muscle spasm -she continue to have intermittent discomfort.  She has trigger point injections bilaterally on 06/07/22.  Age-related osteoporosis with current pathological fracture, initial encounter - DEXA 07/12/2020: Right femoral neck: BMD 0.632 with T score -2.0.  H/o vertebral compression fractures. Forteo  started 08/07/21.  Patient will be finishing Forteo injections soon.  We will obtain repeat DEXA scan.  We had a detailed discussion regarding this phosphonate therapy.  History of vertebral compression fracture-she had no point tenderness on the examination today.  Vitamin D deficiency-patient been taking vitamin D supplement.  Will check vitamin D level today.  Gastroesophageal reflux disease without esophagitis-she takes pantoprazole intermittently.  Other medical problems are listed as follows:  History of depression  History of migraine  History of sleep apnea  Orders: Orders Placed This Encounter  Procedures   DG Bone Density   CBC with Differential/Platelet   COMPLETE METABOLIC PANEL WITH GFR   Uric acid   VITAMIN D 25 Hydroxy (Vit-D Deficiency, Fractures)   No orders of the defined types were placed in this encounter.    Follow-Up Instructions: Return in about 3 months (around 09/17/2023) for Gout, Osteoarthritis, Osteoporosis.   Pollyann Savoy, MD  Note - This record has been created using Animal nutritionist.  Chart creation errors have been sought, but may not always  have been located. Such creation errors do not reflect on  the standard of medical care.

## 2023-06-17 ENCOUNTER — Ambulatory Visit: Payer: Medicare Other | Attending: Rheumatology | Admitting: Rheumatology

## 2023-06-17 ENCOUNTER — Encounter: Payer: Self-pay | Admitting: Rheumatology

## 2023-06-17 ENCOUNTER — Telehealth: Payer: Self-pay | Admitting: Pharmacist

## 2023-06-17 VITALS — BP 128/81 | HR 67 | Resp 15 | Ht 62.0 in | Wt 197.8 lb

## 2023-06-17 DIAGNOSIS — M797 Fibromyalgia: Secondary | ICD-10-CM | POA: Diagnosis not present

## 2023-06-17 DIAGNOSIS — M5030 Other cervical disc degeneration, unspecified cervical region: Secondary | ICD-10-CM | POA: Diagnosis not present

## 2023-06-17 DIAGNOSIS — M1A09X0 Idiopathic chronic gout, multiple sites, without tophus (tophi): Secondary | ICD-10-CM | POA: Diagnosis not present

## 2023-06-17 DIAGNOSIS — Z5181 Encounter for therapeutic drug level monitoring: Secondary | ICD-10-CM

## 2023-06-17 DIAGNOSIS — M19072 Primary osteoarthritis, left ankle and foot: Secondary | ICD-10-CM

## 2023-06-17 DIAGNOSIS — M5136 Other intervertebral disc degeneration, lumbar region: Secondary | ICD-10-CM

## 2023-06-17 DIAGNOSIS — M160 Bilateral primary osteoarthritis of hip: Secondary | ICD-10-CM | POA: Diagnosis not present

## 2023-06-17 DIAGNOSIS — R5383 Other fatigue: Secondary | ICD-10-CM | POA: Diagnosis not present

## 2023-06-17 DIAGNOSIS — T148XXA Other injury of unspecified body region, initial encounter: Secondary | ICD-10-CM | POA: Diagnosis not present

## 2023-06-17 DIAGNOSIS — M19071 Primary osteoarthritis, right ankle and foot: Secondary | ICD-10-CM | POA: Diagnosis not present

## 2023-06-17 DIAGNOSIS — Z96653 Presence of artificial knee joint, bilateral: Secondary | ICD-10-CM

## 2023-06-17 DIAGNOSIS — Z8669 Personal history of other diseases of the nervous system and sense organs: Secondary | ICD-10-CM

## 2023-06-17 DIAGNOSIS — M19041 Primary osteoarthritis, right hand: Secondary | ICD-10-CM | POA: Diagnosis not present

## 2023-06-17 DIAGNOSIS — E559 Vitamin D deficiency, unspecified: Secondary | ICD-10-CM

## 2023-06-17 DIAGNOSIS — M7661 Achilles tendinitis, right leg: Secondary | ICD-10-CM | POA: Diagnosis not present

## 2023-06-17 DIAGNOSIS — M8000XA Age-related osteoporosis with current pathological fracture, unspecified site, initial encounter for fracture: Secondary | ICD-10-CM

## 2023-06-17 DIAGNOSIS — Z8659 Personal history of other mental and behavioral disorders: Secondary | ICD-10-CM

## 2023-06-17 DIAGNOSIS — Z8781 Personal history of (healed) traumatic fracture: Secondary | ICD-10-CM

## 2023-06-17 DIAGNOSIS — K219 Gastro-esophageal reflux disease without esophagitis: Secondary | ICD-10-CM

## 2023-06-17 DIAGNOSIS — M19042 Primary osteoarthritis, left hand: Secondary | ICD-10-CM

## 2023-06-17 DIAGNOSIS — M62838 Other muscle spasm: Secondary | ICD-10-CM

## 2023-06-17 LAB — CBC WITH DIFFERENTIAL/PLATELET
Basophils Absolute: 83 cells/uL (ref 0–200)
Basophils Relative: 1.2 %
Eosinophils Relative: 2.7 %
HCT: 37.5 % (ref 35.0–45.0)
Hemoglobin: 12.4 g/dL (ref 11.7–15.5)
Platelets: 316 10*3/uL (ref 140–400)
RBC: 4.26 10*6/uL (ref 3.80–5.10)
Total Lymphocyte: 28.5 %
WBC: 6.9 10*3/uL (ref 3.8–10.8)

## 2023-06-17 NOTE — Progress Notes (Signed)
Pharmacy Note  Subjective: Patient presents today to the [facility] for follow up office visit.   Patient seen by pharmacist for counseling on Reclast therapy for osteoporosis. Prior osteoporosis treatment includes: Forteo.  Objective: DEXA 07/12/2020: Right femoral neck: BMD 0.632 with T score -2.0. H/o vertebral compression fractures. Forteo started 08/07/21. Patient will be finishing Forteo injections 08/11/23. We will obtain repeat DEXA scan.   Lab Results  Component Value Date   VD25OH 48 07/19/2022   CMP     Component Value Date/Time   NA 142 12/10/2022 0954   NA 141 02/05/2022 1204   K 4.7 12/10/2022 0954   CL 103 12/10/2022 0954   CO2 31 12/10/2022 0954   GLUCOSE 100 (H) 12/10/2022 0954   BUN 21 12/10/2022 0954   BUN 17 02/05/2022 1204   CREATININE 0.92 12/10/2022 0954   CALCIUM 9.5 12/10/2022 0954   PROT 6.8 12/10/2022 0954   PROT 6.5 02/05/2022 1204   ALBUMIN 3.3 (L) 07/31/2022 0317   ALBUMIN 4.0 02/05/2022 1204   AST 21 12/10/2022 0954   ALT 18 12/10/2022 0954   ALKPHOS 139 (H) 07/31/2022 0317   BILITOT 0.2 12/10/2022 0954   BILITOT <0.2 02/05/2022 1204   GFRNONAA >60 08/05/2022 0326   GFRNONAA 68 07/04/2020 1129   GFRAA 81 11/07/2020 1029   GFRAA 79 07/04/2020 1129    Assessment and Plan: Discussed both risendronate and zolendronic acid bisphosphonate options. Patient stated she will not be able to drink a full glass of water without any food, so risendronate/ oral bisphosphonates are not a good option for this patient. Therefore, we will proceed with zolendronic acid.  Counseled patient that Reclast is an IV bisphosphonate that reduces bone turnover by inhibiting osteoclasts that chew up bone.  Counseled patient on purpose, proper use, and adverse effects of Reclast.  Reviewed adverse events of Reclast including risk of nausea & diarrhea, headache, and muscle & bone pain.  Reviewed rare adverse effect of osteonecrosis of the jaw and advised patient  to alert her dentist that she is on Reclast prior to any major dental work.  Patient confirms she does not have any major dental work scheduled at this time.    Reviewed importance of taking calcium and vitamin D with bisphosphonate therapy. Recommended daily amount of calcium is 1200mg  and vitamin D [phone] units. Patient verified she is currently taking vitamin D and calcium supplements. Advised calcium is better obtained through diet vs supplement.  Counseled about risk of excess calcium supplementation such a kidney stones and increased risk of heart disease.    Provided patient with medication education material and answered all questions. Patient agrees to trial of Reclast IV infusion 5 mg yearly at this time.  Reclast is covered 80% by Medicare Part B and the remaining 20% can be covered by a supplemental plan.  Unable to provide exact co-pay amount as it is billed through medical not pharmacy benefit.  Patient verbalized understanding.  Rickey Primus, PharmD Candidate [employer] Class of [phone]

## 2023-06-17 NOTE — Telephone Encounter (Signed)
Please start RECLAST BIV through medical benefit (patient will receive at [facility])  Reclast - [phone]  Dose: 5mg  IV every 12 months  Dx: Age-related osteoporosis (M80.0)  Previously tried therapies: Forteo - completing treatment on 08/11/2023  Therapies patient unable to try: Oral bisphosphonates - GERD and unable to drink water on empty stomach  Chesley Mires, PharmD, MPH, BCPS, CPP Clinical Pharmacist (Rheumatology and Pulmonology)

## 2023-06-17 NOTE — Patient Instructions (Signed)
PLEASE STOP FORTEO INJECTIONS ON 08/11/2023. THIS WILL COMPLETE TWO YEARS OF TREATMENT.  WE WILL START RECLAST AFTER THIS ONCE WE RUN BENEFITS THROUGH THE INSURANCE  Zoledronic Acid Injection (Bone Disorders) What is this medication? ZOLEDRONIC ACID (ZOE le dron ik AS id) prevents and treats osteoporosis. It may also be used to treat Paget's disease of the bone. It works by Interior and spatial designer stronger and less likely to break (fracture). It belongs to a group of medications called bisphosphonates. This medicine may be used for other purposes; ask your health care provider or pharmacist if you have questions. COMMON BRAND NAME(S): Reclast What should I tell my care team before I take this medication? They need to know if you have any of these conditions: Bleeding disorder Cancer Dental disease Kidney disease Low levels of calcium in the blood Low red blood cell counts Lung or breathing disease, such as asthma Receiving steroids, such as dexamethasone or prednisone An unusual or allergic reaction to zoledronic acid, other medications, foods, dyes, or preservatives Pregnant or trying to get pregnant Breast-feeding How should I use this medication? This medication is injected into a vein. It is given by your care team in a hospital or clinic setting. A special MedGuide will be given to you before each treatment. Be sure to read this information carefully each time. Talk to your care team about the use of this medication in children. Special care may be needed. Overdosage: If you think you have taken too much of this medicine contact a poison control center or emergency room at once. NOTE: This medicine is only for you. Do not share this medicine with others. What if I miss a dose? Keep appointments for follow-up doses. It is important not to miss your dose. Call your care team if you are unable to keep an appointment. What may interact with this medication? Certain antibiotics given by  injection Medications for pain and inflammation, such as ibuprofen, naproxen, NSAIDs Some diuretics, such as bumetanide, furosemide Teriparatide This list may not describe all possible interactions. Give your health care provider a list of all the medicines, herbs, non-prescription drugs, or dietary supplements you use. Also tell them if you smoke, drink alcohol, or use illegal drugs. Some items may interact with your medicine. What should I watch for while using this medication? Visit your care team for regular checks on your progress. It may be some time before you see the benefit from this medication. Some people who take this medication have severe bone, joint, or muscle pain. This medication may also increase your risk for jaw problems or a broken thigh bone. Tell your care team right away if you have severe pain in your jaw, bones, joints, or muscles. Tell your care team if you have any pain that does not go away or that gets worse. You should make sure you get enough calcium and vitamin D while you are taking this medication. Discuss the foods you eat and the vitamins you take with your care team. You may need bloodwork while taking this medication. Tell your dentist and dental surgeon that you are taking this medication. You should not have major dental surgery while on this medication. See your dentist to have a dental exam and fix any dental problems before starting this medication. Take good care of your teeth while on this medication. Make sure you see your dentist for regular follow-up appointments. What side effects may I notice from receiving this medication? Side effects that you should report  to your care team as soon as possible: Allergic reactions--skin rash, itching, hives, swelling of the face, lips, tongue, or throat Kidney injury--decrease in the amount of urine, swelling of the ankles, hands, or feet Low calcium level--muscle pain or cramps, confusion, tingling, or numbness in  the hands or feet Osteonecrosis of the jaw--pain, swelling, or redness in the mouth, numbness of the jaw, poor healing after dental work, unusual discharge from the mouth, visible bones in the mouth Severe bone, joint, or muscle pain Side effects that usually do not require medical attention (report to your care team if they continue or are bothersome): Diarrhea Dizziness Headache Nausea Stomach pain Vomiting This list may not describe all possible side effects. Call your doctor for medical advice about side effects. You may report side effects to FDA at 1-800-FDA-1088. Where should I keep my medication? This medication is given in a hospital or clinic. It will not be stored at home. NOTE: This sheet is a summary. It may not cover all possible information. If you have questions about this medicine, talk to your doctor, pharmacist, or health care provider.  2024 Elsevier/Gold Standard (2021-12-29 00:00:00)

## 2023-06-18 ENCOUNTER — Telehealth: Payer: Self-pay | Admitting: *Deleted

## 2023-06-18 DIAGNOSIS — Z96653 Presence of artificial knee joint, bilateral: Secondary | ICD-10-CM | POA: Diagnosis not present

## 2023-06-18 DIAGNOSIS — Z471 Aftercare following joint replacement surgery: Secondary | ICD-10-CM | POA: Diagnosis not present

## 2023-06-18 DIAGNOSIS — R7989 Other specified abnormal findings of blood chemistry: Secondary | ICD-10-CM

## 2023-06-18 LAB — CBC WITH DIFFERENTIAL/PLATELET
Absolute Monocytes: 580 cells/uL (ref 200–950)
Eosinophils Absolute: 186 cells/uL (ref 15–500)
Lymphs Abs: 1967 cells/uL (ref 850–3900)
MCH: 29.1 pg (ref 27.0–33.0)
MCHC: 33.1 g/dL (ref 32.0–36.0)
MCV: 88 fL (ref 80.0–100.0)
MPV: 10.4 fL (ref 7.5–12.5)
Monocytes Relative: 8.4 %
Neutro Abs: 4085 cells/uL (ref 1500–7800)
Neutrophils Relative %: 59.2 %
RDW: 14.3 % (ref 11.0–15.0)

## 2023-06-18 LAB — COMPLETE METABOLIC PANEL WITH GFR
AG Ratio: 1.4 (calc) (ref 1.0–2.5)
ALT: 206 U/L — ABNORMAL HIGH (ref 6–29)
AST: 201 U/L — ABNORMAL HIGH (ref 10–35)
Albumin: 3.8 g/dL (ref 3.6–5.1)
Alkaline phosphatase (APISO): 134 U/L (ref 37–153)
BUN: 20 mg/dL (ref 7–25)
CO2: 31 mmol/L (ref 20–32)
Calcium: 9.2 mg/dL (ref 8.6–10.4)
Chloride: 103 mmol/L (ref 98–110)
Creat: 0.83 mg/dL (ref 0.50–1.05)
Globulin: 2.7 g/dL (calc) (ref 1.9–3.7)
Glucose, Bld: 114 mg/dL — ABNORMAL HIGH (ref 65–99)
Potassium: 4.1 mmol/L (ref 3.5–5.3)
Sodium: 140 mmol/L (ref 135–146)
Total Bilirubin: 0.2 mg/dL (ref 0.2–1.2)
Total Protein: 6.5 g/dL (ref 6.1–8.1)
eGFR: 78 mL/min/{1.73_m2} (ref >=60)

## 2023-06-18 LAB — URIC ACID: Uric Acid, Serum: 5.1 mg/dL (ref 2.5–7.0)

## 2023-06-18 LAB — VITAMIN D 25 HYDROXY (VIT D DEFICIENCY, FRACTURES): Vit D, 25-Hydroxy: 71 ng/mL (ref 30–100)

## 2023-06-18 NOTE — Telephone Encounter (Signed)
-----   Message from [facility] sent at 06/18/2023  8:13 AM EDT ----- Patient's liver functions are elevated.  She has been taking Tylenol on a regular basis.  She states she is not taking any other NSAIDs.  She states that she has been taking Lipitor for a while and the dose has not been increased.  She mentioned that  her liver functions were recently elevated to Dr. Delford Field office.  Please forward results to Dr. Lynelle Doctor.  Please refer patient to gastroenterology for evaluation of elevated LFTs.  I advised her to avoid Tylenol and alcohol use.

## 2023-06-18 NOTE — Progress Notes (Signed)
Vitamin D is high normal at 71.  She may reduce the dose of vitamin D.

## 2023-06-18 NOTE — Progress Notes (Signed)
Patient's liver functions are elevated.  She has been taking Tylenol on a regular basis.  She states she is not taking any other NSAIDs.  She states that she has been taking Lipitor for a while and the dose has not been increased.  She mentioned that her liver functions were recently elevated to Dr. Delford Field office.  Please forward results to Dr. Lynelle Doctor.  Please refer patient to gastroenterology for evaluation of elevated LFTs.  I advised her to avoid Tylenol and alcohol use.

## 2023-06-18 NOTE — Progress Notes (Signed)
Please cancel GI referral.  Patient has been seeing [facility] per Dr. Lynelle Doctor.  Please advise patient to schedule an appointment with the gastroenterologist.  Please forward labs to her gastroenterologist.

## 2023-06-21 DIAGNOSIS — R945 Abnormal results of liver function studies: Secondary | ICD-10-CM | POA: Diagnosis not present

## 2023-06-21 DIAGNOSIS — R7989 Other specified abnormal findings of blood chemistry: Secondary | ICD-10-CM | POA: Diagnosis not present

## 2023-06-28 ENCOUNTER — Other Ambulatory Visit: Payer: Self-pay | Admitting: Adult Health

## 2023-07-02 ENCOUNTER — Ambulatory Visit (HOSPITAL_BASED_OUTPATIENT_CLINIC_OR_DEPARTMENT_OTHER)
Admission: RE | Admit: 2023-07-02 | Discharge: 2023-07-02 | Disposition: A | Discharge status: Home / Self Care | Payer: Medicare Other | Source: Ambulatory Visit | Attending: Rheumatology | Admitting: Rheumatology

## 2023-07-02 DIAGNOSIS — Z8262 Family history of osteoporosis: Secondary | ICD-10-CM | POA: Insufficient documentation

## 2023-07-02 DIAGNOSIS — M85832 Other specified disorders of bone density and structure, left forearm: Secondary | ICD-10-CM | POA: Diagnosis not present

## 2023-07-02 DIAGNOSIS — M8000XA Age-related osteoporosis with current pathological fracture, unspecified site, initial encounter for fracture: Secondary | ICD-10-CM | POA: Insufficient documentation

## 2023-07-02 DIAGNOSIS — M80051A Age-related osteoporosis with current pathological fracture, right femur, initial encounter for fracture: Secondary | ICD-10-CM | POA: Diagnosis not present

## 2023-07-02 NOTE — Progress Notes (Signed)
DEXA scan is consistent with osteopenia with T-score of -1.9.  No comparison was given.  T-score has improved.  Patient will be switching from Forteo to bisphosphonates.  She should continue calcium and vitamin D and resistive exercises.  We will discuss results at the follow-up visit at length.

## 2023-07-03 ENCOUNTER — Telehealth (INDEPENDENT_AMBULATORY_CARE_PROVIDER_SITE_OTHER): Payer: Medicare Other | Admitting: Family Medicine

## 2023-07-03 ENCOUNTER — Encounter: Payer: Self-pay | Admitting: Family Medicine

## 2023-07-03 VITALS — BP 131/74 | HR 53 | Temp 96.5°F | Ht 62.0 in | Wt 195.0 lb

## 2023-07-03 DIAGNOSIS — U071 COVID-19: Secondary | ICD-10-CM | POA: Diagnosis not present

## 2023-07-03 MED ORDER — NIRMATRELVIR/RITONAVIR (PAXLOVID)TABLET
3.0000 | ORAL_TABLET | Freq: Two times a day (BID) | ORAL | 0 refills | Status: AC
Start: 2023-07-03 — End: 2023-07-08

## 2023-07-03 NOTE — Progress Notes (Signed)
Start time:1:27 End time: 1:50  Virtual Visit via Video Note  I connected with [name] on 07/03/23 by a video enabled telemedicine application and verified that I am speaking with the correct person using two identifiers.  Location: Patient: [place] Provider: office   I discussed the limitations of evaluation and management by telemedicine and the availability of in person appointments. The patient expressed understanding and agreed to proceed.  History of Present Illness:  Chief Complaint  Patient presents with   Covid Positive    VIRTUAL positive covid. Had a real bad HA Saturday. Sunday her stomach was upset. Monday started with nasal congestion. Cough started yesterday am. Sometimes her body aches a little bit. Has had some SOB.   Sunday 8/4 she was sick on her stomach and having diarrhea (more chronic, not new). 8/5 she started with runny nose, congestion, sore throat.  Yesterday afternoon she started with some shortness of breath (noticed walking up a slight incline, quickly resolved). She "felt really bad" Monday. Some body aches. No fever or chills. Coughing isn't as bad today as it was yesterday.  Yesterday afternoon and last night she was coughing more. Unable to expectorate the phlegm (but not a dry cough). Nasal drainage is clear. Denies sinus pain.  Tested positive for COVID this morning.  Her husband and mother also have COVID.  She started taking alka selzer sinus and allergy, which helped some.  She has taken it 4x since Monday night. She wasn't aware that it had tylenol in it, which she shouldn't be taking due to elevated liver tests.  AST/ALT were normal 7/18, then went up on 7/22; improved on 7/26 (AST 28, ALT 68. She is scheduled for abdominal US.  Lab Results  Component Value Date   ALT 206 (H) 06/17/2023   AST 201 (H) 06/17/2023   ALKPHOS 139 (H) 07/31/2022   BILITOT 0.2 06/17/2023      PMH, PSH, SH reviewed   COVID vaccines are UTD, last booster  was 01/2023.  Outpatient Encounter Medications as of 07/03/2023  Medication Sig Note   allopurinol (ZYLOPRIM) 300 MG tablet TAKE 1 TABLET BY MOUTH EVERY DAY    ALPRAZolam (XANAX) 0.25 MG tablet Take 1 tablet (0.25 mg total) by mouth 3 (three) times daily as needed for anxiety. 07/03/2023: Took one today   ARIPiprazole (ABILIFY) 5 MG tablet TAKE 1 TABLET (5 MG TOTAL) BY MOUTH DAILY.    Armodafinil 250 MG tablet Take 1 tablet (250 mg) by mouth daily as needed.    aspirin EC 81 MG tablet Take 81 mg by mouth in the morning. Swallow whole.    atorvastatin (LIPITOR) 40 MG tablet Take 1 tablet (40 mg total) by mouth daily.    CALCIUM PO Take 500 mg by mouth in the morning, at noon, and at bedtime.    cetirizine (ZYRTEC) 10 MG tablet Take 10 mg by mouth at bedtime.    DULoxetine (CYMBALTA) 60 MG capsule TAKE 1 CAPSULE BY MOUTH 2 TIMES DAILY.    fluticasone (FLONASE) 50 MCG/ACT nasal spray Place 2 sprays into both nostrils daily.    FORTEO 600 MCG/2.4ML SOPN INJECT UNDER THE SKIN ONCE DAILY 07/03/2023: Until Sept 12th   gabapentin (NEURONTIN) 300 MG capsule Take 300 mg by mouth 4 (four) times daily.    Insulin Pen Needle (B-D UF III MINI PEN NEEDLES) 31G X 5 MM MISC USE TO INJECT FORTEO ONCE DAILY. DISCARD AFTER USE.    methocarbamol (ROBAXIN) 500 MG tablet TAKE  1 TABLET BY MOUTH EVERY DAY AS NEEDED 07/03/2023: Took one last night   metoprolol tartrate (LOPRESSOR) 50 MG tablet TAKE 1 TABLET BY MOUTH TWICE A DAY    Multiple Vitamins-Minerals (PRESERVISION AREDS 2) CAPS Take 1 capsule by mouth in the morning and at bedtime.    OXcarbazepine (TRILEPTAL) 150 MG tablet Take 150 mg by mouth in the morning and at bedtime.    pantoprazole (PROTONIX) 40 MG tablet Take 1 tablet (40 mg total) by mouth daily. (Patient taking differently: Take 40 mg by mouth at bedtime.)    Phenyleph-Doxylamine-DM-APAP (ALKA-SELTZER PLS ALLERGY & CGH) 5-6.25-10-325 MG CAPS Take 2 capsules by mouth as needed. 07/03/2023: Took 2 after  breakfast this morning   PRESCRIPTION MEDICATION CPAP- At bedtime    Probiotic Product (PROBIOTIC ADVANCED PO) Take 1 capsule by mouth at bedtime.    promethazine (PHENERGAN) 25 MG tablet Take 25 mg by mouth every 6 (six) hours as needed for nausea or vomiting. 07/03/2023: Took one last night   RESTASIS 0.05 % ophthalmic emulsion Place 1 drop into both eyes in the morning and at bedtime.    SYSTANE ULTRA PF 0.4-0.3 % SOLN Place 1 drop into both eyes 3 (three) times daily as needed (for dryness).    traZODone (DESYREL) 50 MG tablet TAKE 1 TABLET BY MOUTH EVERYDAY AT BEDTIME 07/03/2023: Took one last night   albuterol (VENTOLIN HFA) 108 (90 Base) MCG/ACT inhaler TAKE 2 PUFFS BY MOUTH EVERY 6 HOURS AS NEEDED FOR WHEEZE OR SHORTNESS OF BREATH (Patient not taking: Reported on 07/03/2023) 07/03/2023: As needed   colchicine 0.6 MG tablet Take 1 tablet (0.6 mg total) by mouth as needed. (Patient not taking: Reported on 07/03/2023) 07/03/2023: As needed   meclizine (ANTIVERT) 25 MG tablet Take 1 tablet (25 mg total) by mouth 3 (three) times daily as needed for dizziness. (Patient not taking: Reported on 07/03/2023) 07/03/2023: As needed   ondansetron (ZOFRAN) 8 MG tablet Take 8 mg by mouth every 8 (eight) hours as needed for nausea. (Patient not taking: Reported on 07/03/2023) 07/03/2023: As needed   traMADol (ULTRAM) 50 MG tablet Take 50 mg by mouth 3 (three) times daily as needed. (Patient not taking: Reported on 06/17/2023) 07/03/2023: As needed   [DISCONTINUED] acetaminophen (TYLENOL) 325 MG tablet Take 2 tablets (650 mg total) by mouth every 4 (four) hours as needed for mild pain or moderate pain (or Fever >/= 101). (Patient not taking: Reported on 07/03/2023)    [DISCONTINUED] hydrOXYzine (ATARAX) 25 MG tablet Take one to four tablets at bedtime. (Patient not taking: Reported on 06/17/2023)    [DISCONTINUED] Ethelda Chick Calcium 500 MG TABS Take by mouth. (Patient not taking: Reported on 06/17/2023)    No facility-administered  encounter medications on file as of 07/03/2023.   Allergies  Allergen Reactions   Erythromycin Nausea Only and Other (See Comments)    Abdominal pain, also   Meperidine Hcl Other (See Comments)    Made the patient feel hot and her heart raced- made her feel "flushed," also   Polyethyl Glyc-Propyl Glyc Pf Nausea And Vomiting and Other (See Comments)    RN confirmed with patient 9/4 (Miralax)   Polyethylene Glycol 3350 Other (See Comments)    Other Reaction(s): GI Intolerance  RN confirmed with patient 9/4 (Miralax)   Zolpidem Other (See Comments)    Lost balance   Dilaudid [Hydromorphone Hcl] Itching    ROS:  URI symptoms per HPI. No fever, chills No loss of smell or taste. No  changes to GI issues--some n/v/d, not more than usual. No urinary complaints. No bleeding, bruising, rashes. No chest pain. Some DOE as reported in HPI.    Observations/Objective:  BP 131/74   Pulse (!) 53   Temp (!) 96.5 F (35.8 C) (Temporal)   Ht 5\' 2"  (1.575 m)   Wt 195 lb (88.5 kg)   LMP 07/27/2006   BMI 35.67 kg/m   Appears well. Scratchy voice No coughing, speaking comfortably. She is alert and oriented. Cranial nerves grossly intact. Exam is limited due to virtual nature of the visit.   Assessment and Plan:   COVID-19 virus infection - rec Paxlovid, Risks/SE reviewed, to stop statin while taking. Supportive measures reviewed. Discussed isolation/masking recs - Plan: nirmatrelvir/ritonavir (PAXLOVID) 20 x 150 MG & 10 x 100MG  TABS   Take Paxlovid to treat COVID. You need to wait until tomorrow to start this, if you have already taken your atorvastatin today. You need to STOP TAKING ATORVASTATIN FOR THE 5 DAYS THAT YOU TAKE THE PAXLOVID. Do not use Colchicine while taking the paxlovid either.  Stop the alka selzer medications. Instead use Mucinex DM 12 hour tablets, and take them twice a day. This has an expectorant to loosen up the mucus and phlegm, and it has a 12 hour acting  cough suppressant. It has no tylenol in it. You should use a low dose ibuprofen (1-2 advil/motrin) if you have a high fever or pain that isn't responding to things like heat/ice.  Since you have COVID now, you can wait 3 months to get the booster that should be coming out soon.  Get it in November/December. You should get the high dose flu shot and the RSV vaccine in the Fall.  Get this from the pharmacy.  Use the albuterol inhaler for any shortness of breath    Follow Up Instructions:    I discussed the assessment and treatment plan with the patient. The patient was provided an opportunity to ask questions and all were answered. The patient agreed with the plan and demonstrated an understanding of the instructions.   The patient was advised to call back or seek an in-person evaluation if the symptoms worsen or if the condition fails to improve as anticipated.  I spent 25 minutes dedicated to the care of this patient, including pre-visit review of records, face to face time, post-visit ordering of testing and documentation.    Lavonda Jumbo, MD

## 2023-07-03 NOTE — Patient Instructions (Signed)
Take Paxlovid to treat COVID. You need to wait until tomorrow to start this, if you have already taken your atorvastatin today. You need to STOP TAKING ATORVASTATIN FOR THE 5 DAYS THAT YOU TAKE THE PAXLOVID. Do not use Colchicine while taking the paxlovid either.  Stop the alka selzer medications. Instead use Mucinex DM 12 hour tablets, and take them twice a day. This has an expectorant to loosen up the mucus and phlegm, and it has a 12 hour acting cough suppressant. It has no tylenol in it. You should use a low dose ibuprofen (1-2 advil/motrin) if you have a high fever or pain that isn't responding to things like heat/ice.   Use the albuterol inhaler for any shortness of breath  Stay at home (isolate) until you are fever-free for 24 hours and your respiratory symptoms are significantly improved.  You may then leave your house, but wear a mask for 5 days after feeling better.  Since you have COVID now, you can wait 3 months to get the booster that should be coming out soon.  Get it in November/December. You should get the high dose flu shot and the RSV vaccine in the Fall.  Get this from the pharmacy.

## 2023-07-10 ENCOUNTER — Other Ambulatory Visit: Payer: Self-pay | Admitting: Adult Health

## 2023-07-10 DIAGNOSIS — F411 Generalized anxiety disorder: Secondary | ICD-10-CM

## 2023-07-10 DIAGNOSIS — F331 Major depressive disorder, recurrent, moderate: Secondary | ICD-10-CM

## 2023-07-16 ENCOUNTER — Other Ambulatory Visit: Payer: Self-pay | Admitting: Physician Assistant

## 2023-07-16 ENCOUNTER — Other Ambulatory Visit (HOSPITAL_COMMUNITY): Payer: Self-pay

## 2023-07-16 DIAGNOSIS — R7989 Other specified abnormal findings of blood chemistry: Secondary | ICD-10-CM | POA: Diagnosis not present

## 2023-07-16 DIAGNOSIS — R748 Abnormal levels of other serum enzymes: Secondary | ICD-10-CM | POA: Diagnosis not present

## 2023-07-16 DIAGNOSIS — K760 Fatty (change of) liver, not elsewhere classified: Secondary | ICD-10-CM | POA: Diagnosis not present

## 2023-07-16 DIAGNOSIS — R112 Nausea with vomiting, unspecified: Secondary | ICD-10-CM | POA: Diagnosis not present

## 2023-07-16 DIAGNOSIS — K838 Other specified diseases of biliary tract: Secondary | ICD-10-CM | POA: Diagnosis not present

## 2023-07-16 MED ORDER — ARMODAFINIL 250 MG PO TABS
250.0000 mg | ORAL_TABLET | Freq: Every day | ORAL | 0 refills | Status: DC | PRN
  Filled 2023-07-16: qty 30, 30d supply, fill #0

## 2023-07-16 NOTE — Telephone Encounter (Signed)
Last Fill: 05/20/2023  Next Visit: 09/17/2023  Last Visit: 06/17/2023  Dx: Other fatigue-related to fibromyalgia   Current Dose per office note on 06/17/2023: Nuvigil to 50 mg p.o. every morning in January 2024 due to ongoing severe fatigue.   Okay to refill Armodafinil?

## 2023-07-23 DIAGNOSIS — H9201 Otalgia, right ear: Secondary | ICD-10-CM | POA: Diagnosis not present

## 2023-07-24 DIAGNOSIS — R7989 Other specified abnormal findings of blood chemistry: Secondary | ICD-10-CM | POA: Diagnosis not present

## 2023-07-26 DIAGNOSIS — M6281 Muscle weakness (generalized): Secondary | ICD-10-CM | POA: Diagnosis not present

## 2023-07-26 NOTE — Telephone Encounter (Signed)
Contacted [facility] to investigate [phone] through patient's medical benefits. Per rep, provider is In-Network and a Pre-Authorization is NOT required for this J-Code. The patient will be responsible for a 20% coinsurance charge until any Deductible and/or Maximum Out of Pocket requirements have been met for the current calendar year.   At this time the patient has no deductible requirement and has met $271.45 out of their $3,600 Maximum Out of Pocket requirement.  Call Ref#: [id number] Phone#: [phone]

## 2023-07-31 ENCOUNTER — Other Ambulatory Visit: Payer: Self-pay | Admitting: Rheumatology

## 2023-07-31 ENCOUNTER — Ambulatory Visit: Payer: Medicare Other | Admitting: Medical

## 2023-07-31 DIAGNOSIS — M8000XA Age-related osteoporosis with current pathological fracture, unspecified site, initial encounter for fracture: Secondary | ICD-10-CM

## 2023-07-31 MED ORDER — BD PEN NEEDLE MINI U/F 31G X 5 MM MISC
0 refills | Status: DC
Start: 2023-07-31 — End: 2023-08-05

## 2023-07-31 NOTE — Telephone Encounter (Signed)
Patient states she only needs a few more needles. The smallest amount we can send in is 30. Patient is agreeable to that.

## 2023-07-31 NOTE — Telephone Encounter (Signed)
Can a fewer quantity of needles not be dispensed?

## 2023-07-31 NOTE — Telephone Encounter (Signed)
Pt called stating that she was told to end the Forteo in a few weeks. Pt states that she needs 5 more needles but would have to buy 100. Pt would rather stop early and start new medication. Pt would like to be reached at [phone].

## 2023-08-02 ENCOUNTER — Other Ambulatory Visit: Payer: Self-pay | Admitting: Rheumatology

## 2023-08-02 DIAGNOSIS — M8000XA Age-related osteoporosis with current pathological fracture, unspecified site, initial encounter for fracture: Secondary | ICD-10-CM

## 2023-08-02 NOTE — Telephone Encounter (Signed)
Patient left a voicemail requesting a return call regarding her needles for her medication.  Patient states she was told by someone from the office that a prescription for 30 would be sent to the pharmacy.  Patient states the pharmacy told her she coulld only get them in 100 pack and they would not be available until Monday, 08/05/23.

## 2023-08-02 NOTE — Telephone Encounter (Signed)
Attempted to contact patient and left message to advise patient to contact the office.

## 2023-08-05 ENCOUNTER — Telehealth: Payer: Self-pay | Admitting: Rheumatology

## 2023-08-05 ENCOUNTER — Other Ambulatory Visit: Payer: Self-pay

## 2023-08-05 ENCOUNTER — Other Ambulatory Visit (HOSPITAL_COMMUNITY): Payer: Self-pay

## 2023-08-05 MED ORDER — BD PEN NEEDLE MINI U/F 31G X 5 MM MISC
0 refills | Status: AC
Start: 2023-08-05 — End: ?
  Filled 2023-08-05: qty 100, 90d supply, fill #0
  Filled 2023-08-05: qty 100, 100d supply, fill #0

## 2023-08-05 NOTE — Telephone Encounter (Signed)
See previous phone note for details 

## 2023-08-05 NOTE — Telephone Encounter (Signed)
Patient returned call to the office and requested the prescription to be sent to [facility] for 5 needles.

## 2023-08-05 NOTE — Telephone Encounter (Signed)
Patient left a voicemail (Friday, 08/02/23 5:30 pm) stating she was returning Marissa's call.  Patient states she also uses [facility].

## 2023-08-13 ENCOUNTER — Ambulatory Visit (INDEPENDENT_AMBULATORY_CARE_PROVIDER_SITE_OTHER): Payer: Self-pay | Admitting: Adult Health

## 2023-08-13 DIAGNOSIS — Z9884 Bariatric surgery status: Secondary | ICD-10-CM | POA: Diagnosis not present

## 2023-08-13 DIAGNOSIS — T188XXA Foreign body in other parts of alimentary tract, initial encounter: Secondary | ICD-10-CM | POA: Diagnosis not present

## 2023-08-13 DIAGNOSIS — Z4802 Encounter for removal of sutures: Secondary | ICD-10-CM | POA: Diagnosis not present

## 2023-08-13 DIAGNOSIS — Z0389 Encounter for observation for other suspected diseases and conditions ruled out: Secondary | ICD-10-CM

## 2023-08-13 DIAGNOSIS — R112 Nausea with vomiting, unspecified: Secondary | ICD-10-CM | POA: Diagnosis not present

## 2023-08-13 NOTE — Progress Notes (Signed)
Patient no show appointment. ? ?

## 2023-08-17 ENCOUNTER — Other Ambulatory Visit: Payer: Self-pay | Admitting: Physician Assistant

## 2023-08-17 DIAGNOSIS — M8000XA Age-related osteoporosis with current pathological fracture, unspecified site, initial encounter for fracture: Secondary | ICD-10-CM

## 2023-08-21 ENCOUNTER — Ambulatory Visit: Payer: Medicare Other | Admitting: Adult Health

## 2023-08-21 ENCOUNTER — Encounter: Payer: Self-pay | Admitting: Adult Health

## 2023-08-21 DIAGNOSIS — G4700 Insomnia, unspecified: Secondary | ICD-10-CM | POA: Diagnosis not present

## 2023-08-21 DIAGNOSIS — F411 Generalized anxiety disorder: Secondary | ICD-10-CM

## 2023-08-21 DIAGNOSIS — F331 Major depressive disorder, recurrent, moderate: Secondary | ICD-10-CM | POA: Diagnosis not present

## 2023-08-21 MED ORDER — ALPRAZOLAM 0.25 MG PO TABS
0.2500 mg | ORAL_TABLET | Freq: Three times a day (TID) | ORAL | 2 refills | Status: DC | PRN
Start: 2023-08-21 — End: 2023-11-14

## 2023-08-21 MED ORDER — ARIPIPRAZOLE 5 MG PO TABS
5.0000 mg | ORAL_TABLET | Freq: Every day | ORAL | 5 refills | Status: DC
Start: 2023-08-21 — End: 2023-10-03

## 2023-08-21 NOTE — Progress Notes (Deleted)
No chief complaint on file.   [name] is a 67 y.o. female who presents for annual physical exam, Medicare wellness visit and follow-up on chronic medical conditions.   She has the following concerns:     Pre-diabetes and obesity:  A1c was up to 6.3% on last check in 01/2023. She had Roux-En-Y on 09/29/2019.  She had gotten down into the 160's.  She has regained some weight, which had been partly related to issues with her feet/surgeries, and then affected her moods.  She sees GI regularly. She has some chronic nausea and heaving. She underwent EGD on 07/1723, which was normal. Some retained staples were noted, which were removed. GERD controlled with PPI. She was noted to have elevated lft's per Dr. Fatima Sanger labs in July 2024 (had been normal at GI earlier that month). She f/u with GI, and had further testing done. Lab Results  Component Value Date   ALT 206 (H) 06/17/2023   AST 201 (H) 06/17/2023   ALKPHOS 139 (H) 07/31/2022   BILITOT 0.2 06/17/2023   AST 28 and ALT 68 on recheck a few days later. Repeated 8/28 and AST and ALT were normal, alk phos mildly elevated at 127.   Wt Readings from Last 3 Encounters:  07/03/23 195 lb (88.5 kg)  06/17/23 197 lb 12.8 oz (89.7 kg)  04/03/23 196 lb (88.9 kg)    Hyperlipidemia follow-up:  Patient is trying to follow a low-fat, low cholesterol diet as best she can.  Eats red meat 1-2x/week. Occasional eggs, no butter or cheese.  Uses vinaigrette dressing.  Compliant with medications (atorvastatin 40mg ) and denies medication side effects.  Lipids were at goal on last check: Lab Results  Component Value Date   CHOL 110 02/06/2023   HDL 43 02/06/2023   LDLCALC 42 02/06/2023   TRIG 148 02/06/2023   CHOLHDL 2.6 02/06/2023    Hypertension: (never formally diagnosed, because had been on diuretics for fluid retention (stopped a while ago) and beta blockers for tachycardia and tremor--but high BP's noted when not taking these medications  in past).   She continues on metoprolol 50mg  BID (originally prescribed by Dr. Eden Emms, for tachycardia). Denies chest pain, palpitations/tachycardia, lightheadedness, edema. Occasional headache.  BP Readings from Last 3 Encounters:  07/03/23 131/74  06/17/23 128/81  04/03/23 110/78     She is under the care of rheumatology clinic for fibromyalgia, osteoarthritis, degenerative disc disease, chronic fatigue, osteoporosis.    She continues on cymbalta, gabapentin (also for neuropathy in feet), and robaxin 500mg  BID prn. They prescribe her armodafinil for her chronic fatigue, using it only when "very zapped out".   She uses 1/2 tablet 2-3x/week. *** Her fibromyalgia hasn't been too bad, has pain daily but reports it is manageable most of the time.   She had T12 and L1 compression fractures after a fall in 01/2020. She had worsening back pain in July/August 2022, and was found to have new, acute compression fractures. She ultimately had kyphoplasty done 10/2021 by Dr. Conchita Paris. She was started on Forteo injections by rheum in 07/2021. DEXA in 2018 was normal.  DEXA 06/2020 T-2.0 R fem neck, osteopenia Dexa was repeated 06/2023 by rheum--T-1.9 L wrist, -1.4 L fem neck. Switched from [place] to Bisphosphonate. ??Started??***    Gout--she continues on allopurinol 300mg  daily, and hasn't had any flares in several years.  Lab Results  Component Value Date   LABURIC 5.1 06/17/2023      She has OSA, under the care of Dr.  Turner. She is on CPAP.  Energy is improved, but still requires Nuvigil, though is using this much less often now, as reported above.   She is under the care of Dr. Delphia Grates for RLS, neuropathy (pedal paresthesias), tremor (benign essential vs accentuated physiologic tremor), migraines. She takes gabapentin for restless legs as well as her neuropathy pain.  RLS is well controlled.  She has headaches often, but not migraines.  Headaches are at her temples most of the time. Tylenol  sometimes helps with HA, uses tramadol prn.   Depression:  Sees Regina at [facility], continues on Cymbalta and Abilify.    Immunization History  Administered Date(s) Administered   Fluad Quad(high Dose 65+) 09/06/2022   Influenza Inj Mdck Quad Pf 09/13/2019   Influenza Split 09/17/2011, 09/03/2012, 09/04/2013   Influenza,inj,Quad PF,6+ Mos 08/16/2014, 07/27/2015, 07/31/2017, 08/13/2018, 07/28/2020   Influenza-Unspecified 10/04/2016, 07/31/2017, 09/23/2017, 09/18/2021   PFIZER Comirnaty(Gray Top)Covid-19 Tri-Sucrose Vaccine 06/01/2021   PFIZER(Purple Top)SARS-COV-2 Vaccination 02/20/2020, 03/12/2020, 09/20/2020   PNEUMOCOCCAL CONJUGATE-20 07/26/2022   Pfizer Covid-19 Vaccine Bivalent Booster 82yrs & up 02/05/2022   Pfizer(Comirnaty)Fall Seasonal Vaccine 12 years and older 01/31/2023   Tdap 01/16/2011, 06/20/2017   Zoster Recombinant(Shingrix) 02/05/2019, 07/14/2019   Last Pap smear: 03/2023 normal, per GYN Last mammogram: 02/2023 Last colonoscopy: 11/2012 at [facility] Last DEXA: 06/2023 T-1.9 L wrist, -1.4 L fem neck. Dentist: twice yearly Ophtho: yearly Exercise:    exercise bike 4-5 days/week??   Vitamin D-OH 48 in 06/2022; level was up to 71 in 05/2023, and Dr. Corliss Skains recommended lowering her dose. UPDATE ***   Thyroid: Lab Results  Component Value Date   TSH 2.406 01/22/2021   Patient Care Team: Joselyn Arrow, MD as PCP - General (Family Medicine) Wendall Stade, MD as PCP - Cardiology (Cardiology) Quintella Reichert, MD as PCP - Sleep Medicine (Cardiology) Verner Chol, [facility] (Inactive) as Pharmacist (Pharmacist) Olivia Mackie, NP as Nurse Practitioner (Gynecology) Curt Bears, MD as Referring Physician (Neurology) Pollyann Savoy, MD as Consulting Physician (Rheumatology) Violet Baldy, MD as Referring Physician (Gastroenterology) Redgie Grayer, MD as Referring Physician (Internal Medicine) Edwin Cap, DPM as Consulting Physician (Podiatry) Nelson Chimes, MD as Attending Physician (Ophthalmology) Bjorn Pippin, MD as Attending Physician (Urology) Michele Mcalpine, MD (Pulmonary Disease) Coralyn Helling, MD as Consulting Physician (Pulmonary Disease) Ollen Gross, MD as Consulting Physician (Orthopedic Surgery) Jene Every, MD as Consulting Physician (Orthopedic Surgery) Venancio Poisson, MD as Consulting Physician (Dermatology) Raymondo Band, MD as Consulting Physician (Infectious Diseases) GYN: Wyline Beady, NP Psych: Rene Kocher (previously Dr. Nolen Mu) Dentist: Dr. Cheri Guppy Bariatric surgeon: Dr. Daphine Deutscher   Depression Screening:    11/05/2022    9:32 AM 09/25/2022    9:38 AM 07/26/2022    2:41 PM 07/26/2022    9:13 AM 07/24/2022    2:28 PM  Depression screen PHQ 2/9  Decreased Interest 0 0 1 0 0  Down, Depressed, Hopeless 1 0 1 1 0  PHQ - 2 Score 1 0 2 1 0  Altered sleeping   3    Tired, decreased energy   2    Change in appetite   0    Feeling bad or failure about yourself    0    Trouble concentrating   1    Moving slowly or fidgety/restless   1    Suicidal thoughts   0    PHQ-9 Score   9    Difficult doing work/chores   Somewhat difficult  Falls screen:     04/03/2023    3:27 PM 01/31/2023    3:19 PM 12/26/2022   10:32 AM 11/05/2022    9:32 AM 09/25/2022    9:38 AM  Fall Risk   Falls in the past year? 1 0 0 0 0  Number falls in past yr: 1 0 0  0  Injury with Fall? 0 0 0  0  Comment fell last night      Risk for fall due to : History of fall(s) No Fall Risks No Fall Risks No Fall Risks   Follow up  Falls evaluation completed Falls evaluation completed Falls evaluation completed      Functional Status Survey:        She has advanced directives (living will, healthcare power of attorney). Scanned in chart in 05/2019.  PMH, PSH, SH and FH were updated and reviewed.    ROS: The patient denies fever, vision changes, decreased hearing, breast concerns, palpitations, dizziness, syncope, cough, swelling, bowel  changes (goes back and forth with diarrhea and constipation, per her usual; diarrhea isn't as often as it used to be), no abdominal pain, melena, hematochezia, indigestion/heartburn, hematuria, dysuria, vaginal bleeding, discharge, odor or itch, genital lesions, weakness, suspicious skin lesions, abnormal bleeding/bruising, or enlarged lymph nodes. Rare shortness of breath with stairs--got worse since she regained some weight. Fibromyalgia unchanged, manageable.   No gout flares. Chronic urinary leakage, stable. Upper back pain, daily, manageable. Neuropathy in LE's--burning in feet, chronic. Occasional headaches, overall improved, no migraines. Moods? Foot pain?   PHYSICAL EXAM:  LMP 07/27/2006   Wt Readings from Last 3 Encounters:  07/03/23 195 lb (88.5 kg)  06/17/23 197 lb 12.8 oz (89.7 kg)  04/03/23 196 lb (88.9 kg)  06/2021 wt 181#  General Appearance:    Alert, cooperative, no distress, appears stated age  Head:    Normocephalic, without obvious abnormality, atraumatic  Eyes:    PERRL, conjunctiva/corneas clear, EOM's intact, fundi    benign  Ears:    Normal TM's and external ear canals  Nose:   Normal, no drainage or sinus tenderness  Throat:  Normal no lesions  Neck:   Supple, no lymphadenopathy;  thyroid:  no enlargement/ tenderness/nodules; no carotid bruit or JVD  Back:    Spine nontender, no curvature, ROM normal, no CVA tenderness. Mild tenderness at R trapezius, though no palpable spasm.  Lungs:     Clear to auscultation bilaterally without wheezes, rales or  ronchi; respirations unlabored  Chest Wall:    No tenderness or deformity.    Heart:    Regular rate and rhythm, S1 and S2 normal, no murmur, rub or gallop  Breast Exam:    Deferred to GYN  Abdomen:     Soft, obese, nondistended, normoactive bowel sounds, no masses, no hepatosplenomegaly.  Minimally tender in epigastrium.  Genitalia:    Deferred to GYN       Extremities:   No clubbing, cyanosis or edema   Pulses:   2+ and symmetric all extremities  Skin:   Skin color, texture, turgor normal, no rashes or lesions  Lymph nodes:   Cervical, supraclavicular nodes normal  Neurologic:   Normal strength, sensation and gait; reflexes 2+ and symmetric throughout. No significant tremor noted                             Psych:   Somewhat depressed mood, full range of affect, normal hygiene and  grooming   ***UPDATE foot exam  ASSESSMENT/PLAN:  A1c Had COVID in 06/2023, can delay COVID until November. Flu shot RSV from pharmacy if she hasn't had yet  She had EGD recently. Did she ever have colonoscopy??? Was due 11/2022  Per DEXA 06/2023 from rheum, supposed to stop Forteo and start bisphosphonates.  Was she prescribed one???   Discussed monthly self breast exams and yearly mammograms; at least 30 minutes of aerobic activity at least 5 days/week, weight-bearing exercise at least 2x/wk; proper sunscreen use reviewed; healthy diet, including goals of calcium and vitamin D intake and alcohol recommendations (less than or equal to 1 drink/day) reviewed; regular seatbelt use; changing batteries in smoke detectors, carbon monoxide detectors. Immunization recommendations discussed--continue yearly high dose flu shots COVID booster can be delayed until November due to infection in August. RSV vaccine recommended from pharmacy.  Prevnar-20 given today. Colonoscopy is UTD per her GI, due again 11/2022.  Full Code, Full Care MOST form reviewed/updated   Medicare Attestation I have personally reviewed: The patient's medical and social history Their use of alcohol, tobacco or illicit drugs Their current medications and supplements The patient's functional ability including ADLs,fall risks, home safety risks, cognitive, and hearing and visual impairment Diet and physical activities Evidence for depression or mood disorders  The patient's weight, height, BMI have been recorded in the chart.  I have made  referrals, counseling, and provided education to the patient based on review of the above and I have provided the patient with a written personalized care plan for preventive services.

## 2023-08-21 NOTE — Progress Notes (Signed)
[name] [id number] 1956-05-18 67 y.o.  Subjective:   Patient ID:  [name] is a 67 y.o. (DOB [birth year]) female.  Chief Complaint: No chief complaint on file.   HPI JANILL FUSCO presents to the office today for follow-up of anxiety, depression, and insomnia.  Describes mood today as "not the best". Pleasant. Tearful throughout interview. Mood symptoms - reports some situational depression and anxiety. Reports some irritability. Reports some worry, rumination and over thinking. Reports increased situational stressors with husband's recent health issues. Mood is consistent. Stating "I feel like I'm doing ok, still struggling". Feels like medications are helpful. Stable interest and motivation. Taking medications as prescribed.  Energy levels lower. Active, does not have a regular exercise. Enjoys some usual interests and activities. Married. Lives with husband. Has 2 sons - one local and another one in [place]. Mother local - 73. Spending time with family. Appetite adequate.  Sleeps better some nights than others - up and down with husband. Using CPAP machine. Focus and concentration stable. Completing tasks. Managing aspects of household. Retired. Denies SI or HI.  Denies AH or VH Denies self harm. Denies substance use.  Bariatric surgery 11/20.    PHQ2-9    Flowsheet Row Office Visit from 11/05/2022 in [facility] for Infectious Disease Most recent reading at 11/05/2022  9:32 AM Office Visit from 09/25/2022 in [facility] for Infectious Disease Most recent reading at 09/25/2022  9:38 AM Office Visit from 07/26/2022 in [facility] Most recent reading at 07/26/2022  2:41 PM Video Visit from 07/26/2022 in [facility] for Infectious Disease Most recent reading at 07/26/2022  9:13 AM Office Visit from 07/24/2022 in [facility] for Infectious Disease Most recent reading at 07/24/2022  2:28 PM  PHQ-2 Total  Score 1 0 2 1 0  PHQ-9 Total Score -- -- 9 -- --      Flowsheet Row ED to Hosp-Admission (Discharged) from 07/30/2022 in [facility] ED from 12/31/2021 in [facility] Emergency Department at [facility] ED from 07/04/2021 in [facility] at [facility]  C-SSRS RISK CATEGORY No Risk No Risk No Risk        Review of Systems:  Review of Systems  Musculoskeletal:  Negative for gait problem.  Neurological:  Negative for tremors.  Psychiatric/Behavioral:         Please refer to HPI    Medications: I have reviewed the patient's current medications.  Current Outpatient Medications  Medication Sig Dispense Refill   albuterol (VENTOLIN HFA) 108 (90 Base) MCG/ACT inhaler TAKE 2 PUFFS BY MOUTH EVERY 6 HOURS AS NEEDED FOR WHEEZE OR SHORTNESS OF BREATH (Patient not taking: Reported on 07/03/2023) 8.5 each 0   allopurinol (ZYLOPRIM) 300 MG tablet TAKE 1 TABLET BY MOUTH EVERY DAY 90 tablet 1   ALPRAZolam (XANAX) 0.25 MG tablet Take 1 tablet (0.25 mg total) by mouth 3 (three) times daily as needed for anxiety. 90 tablet 2   ARIPiprazole (ABILIFY) 5 MG tablet Take 1 tablet (5 mg total) by mouth daily. 30 tablet 5   Armodafinil 250 MG tablet Take 1 tablet (250 mg) by mouth daily as needed. 30 tablet 0   aspirin EC 81 MG tablet Take 81 mg by mouth in the morning. Swallow whole.     atorvastatin (LIPITOR) 40 MG tablet Take 1 tablet (40 mg total) by mouth daily. 90 tablet 3   CALCIUM PO Take 500 mg by  mouth in the morning, at noon, and at bedtime.     cetirizine (ZYRTEC) 10 MG tablet Take 10 mg by mouth at bedtime.     colchicine 0.6 MG tablet Take 1 tablet (0.6 mg total) by mouth as needed. (Patient not taking: Reported on 07/03/2023) 30 tablet 0   DULoxetine (CYMBALTA) 60 MG capsule TAKE 1 CAPSULE BY MOUTH 2 TIMES DAILY. 180 capsule 3   fluticasone (FLONASE) 50 MCG/ACT nasal spray Place 2 sprays into both nostrils daily. 16 g 11   FORTEO 600 MCG/2.4ML SOPN  INJECT UNDER THE SKIN ONCE DAILY 7.2 mL 0   gabapentin (NEURONTIN) 300 MG capsule Take 300 mg by mouth 4 (four) times daily.     Insulin Pen Needle (B-D UF III MINI PEN NEEDLES) 31G X 5 MM MISC USE TO INJECT FORTEO ONCE DAILY. DISCARD AFTER USE. 100 each 0   meclizine (ANTIVERT) 25 MG tablet Take 1 tablet (25 mg total) by mouth 3 (three) times daily as needed for dizziness. (Patient not taking: Reported on 07/03/2023) 30 tablet 0   methocarbamol (ROBAXIN) 500 MG tablet TAKE 1 TABLET BY MOUTH EVERY DAY AS NEEDED 30 tablet 2   metoprolol tartrate (LOPRESSOR) 50 MG tablet TAKE 1 TABLET BY MOUTH TWICE A DAY 180 tablet 1   Multiple Vitamins-Minerals (PRESERVISION AREDS 2) CAPS Take 1 capsule by mouth in the morning and at bedtime.     ondansetron (ZOFRAN) 8 MG tablet Take 8 mg by mouth every 8 (eight) hours as needed for nausea. (Patient not taking: Reported on 07/03/2023)  0   OXcarbazepine (TRILEPTAL) 150 MG tablet Take 150 mg by mouth in the morning and at bedtime.     pantoprazole (PROTONIX) 40 MG tablet Take 1 tablet (40 mg total) by mouth daily. (Patient taking differently: Take 40 mg by mouth at bedtime.) 90 tablet 0   Phenyleph-Doxylamine-DM-APAP (ALKA-SELTZER PLS ALLERGY & CGH) 5-6.25-10-325 MG CAPS Take 2 capsules by mouth as needed.     PRESCRIPTION MEDICATION CPAP- At bedtime     Probiotic Product (PROBIOTIC ADVANCED PO) Take 1 capsule by mouth at bedtime.     promethazine (PHENERGAN) 25 MG tablet Take 25 mg by mouth every 6 (six) hours as needed for nausea or vomiting.     RESTASIS 0.05 % ophthalmic emulsion Place 1 drop into both eyes in the morning and at bedtime.     SYSTANE ULTRA PF 0.4-0.3 % SOLN Place 1 drop into both eyes 3 (three) times daily as needed (for dryness).     traMADol (ULTRAM) 50 MG tablet Take 50 mg by mouth 3 (three) times daily as needed. (Patient not taking: Reported on 06/17/2023)     traZODone (DESYREL) 50 MG tablet TAKE 1 TABLET BY MOUTH EVERYDAY AT BEDTIME 90  tablet 0   No current facility-administered medications for this visit.    Medication Side Effects: None  Allergies:  Allergies  Allergen Reactions   Erythromycin Nausea Only and Other (See Comments)    Abdominal pain, also   Meperidine Hcl Other (See Comments)    Made the patient feel hot and her heart raced- made her feel "flushed," also   Polyethyl Glyc-Propyl Glyc Pf Nausea And Vomiting and Other (See Comments)    RN confirmed with patient 9/4 (Miralax)   Polyethylene Glycol 3350 Other (See Comments)    Other Reaction(s): GI Intolerance  RN confirmed with patient 9/4 (Miralax)   Zolpidem Other (See Comments)    Lost balance   Dilaudid [Hydromorphone Hcl]  Itching    Past Medical History:  Diagnosis Date   Anemia    previously followed by Dr. Dalene Carrow for anemia and elevated platelets   Anxiety    C. difficile colitis 10/01/2012   treated by [facility]   Chronic fatigue syndrome    Closed wedge compression fracture of T8 vertebra (HCC) 06/2021   DDD (degenerative disc disease), lumbar 08/19/2014   and facet arthroplasty & left lumbar radiculopathy (Dr.Ramos)   Depression    Dyssynergia    dyssynergenic defecation, contributing to fecal incontinence.   Edema    Fibromyalgia    Gastroparesis    followed at [facility]   GERD (gastroesophageal reflux disease)    History of kidney stones    History of vertebral fracture 06/30/2021   Hyperlipidemia    Kidney stone    Lumbar radiculopathy    Migraine    Neuropathy    Obstructive sleep apnea    Does  wear  CPAP   Osteoporosis    Paresthesia    Dr. Antonietta Barcelona at [facility]   Pelvic floor dysfunction    pelvic floor dyssynergy   Plantar fasciitis 02/2011   R foot   Pneumonia    2012   PONV (postoperative nausea and vomiting)    pt states has gastroparesis has difficulty taking antibiotics and narcotics has severe nausea and vomiting    Restless leg syndrome    S/P endometrial ablation 08/09/2006   Novasure Ablation    S/P epidural steroid injection 09/20/2014   Dr.Ramos   Tremor    Dr. Antonietta Barcelona   Urinary frequency    Urinary incontinence     Past Medical History, Surgical history, Social history, and Family history were reviewed and updated as appropriate.   Please see review of systems for further details on the patient's review from today.   Objective:   Physical Exam:  LMP 07/27/2006   Physical Exam Constitutional:      General: She is not in acute distress. Musculoskeletal:        General: No deformity.  Neurological:     Mental Status: She is alert and oriented to person, place, and time.     Coordination: Coordination normal.  Psychiatric:        Attention and Perception: Attention and perception normal. She does not perceive auditory or visual hallucinations.        Mood and Affect: Affect is not labile, blunt, angry or inappropriate.        Speech: Speech normal.        Behavior: Behavior normal.        Thought Content: Thought content normal. Thought content is not paranoid or delusional. Thought content does not include homicidal or suicidal ideation. Thought content does not include homicidal or suicidal plan.        Cognition and Memory: Cognition and memory normal.        Judgment: Judgment normal.     Comments: Insight intact     Lab Review:     Component Value Date/Time   NA 140 06/17/2023 0955   NA 141 02/05/2022 1204   K 4.1 06/17/2023 0955   CL 103 06/17/2023 0955   CO2 31 06/17/2023 0955   GLUCOSE 114 (H) 06/17/2023 0955   BUN 20 06/17/2023 0955   BUN 17 02/05/2022 1204   CREATININE 0.83 06/17/2023 0955   CALCIUM 9.2 06/17/2023 0955   PROT 6.5 06/17/2023 0955   PROT 6.5 02/05/2022 1204   ALBUMIN 3.3 (L) 07/31/2022 6962  ALBUMIN 4.0 02/05/2022 1204   AST 201 (H) 06/17/2023 0955   ALT 206 (H) 06/17/2023 0955   ALKPHOS 139 (H) 07/31/2022 0317   BILITOT 0.2 06/17/2023 0955   BILITOT <0.2 02/05/2022 1204   GFRNONAA >60 08/05/2022 0326   GFRNONAA 68  07/04/2020 1129   GFRAA 81 11/07/2020 1029   GFRAA 79 07/04/2020 1129       Component Value Date/Time   WBC 6.9 06/17/2023 0955   RBC 4.26 06/17/2023 0955   HGB 12.4 06/17/2023 0955   HGB 12.5 07/17/2022 1614   HGB 11.7 05/02/2011 1412   HCT 37.5 06/17/2023 0955   HCT 38.0 07/17/2022 1614   HCT 37.1 05/02/2011 1412   PLT 316 06/17/2023 0955   PLT 431 07/17/2022 1614   MCV 88.0 06/17/2023 0955   MCV 91 07/17/2022 1614   MCV 94.4 05/02/2011 1412   MCH 29.1 06/17/2023 0955   MCHC 33.1 06/17/2023 0955   RDW 14.3 06/17/2023 0955   RDW 13.2 07/17/2022 1614   RDW 18.3 (H) 05/02/2011 1412   LYMPHSABS 1,967 06/17/2023 0955   LYMPHSABS 3.2 (H) 07/17/2022 1614   LYMPHSABS 2.4 05/02/2011 1412   MONOABS 0.9 08/02/2022 0341   MONOABS 0.8 05/02/2011 1412   EOSABS 186 06/17/2023 0955   EOSABS 0.3 07/17/2022 1614   BASOSABS 83 06/17/2023 0955   BASOSABS 0.1 07/17/2022 1614   BASOSABS 0.1 05/02/2011 1412    No results found for: "POCLITH", "LITHIUM"   No results found for: "PHENYTOIN", "PHENOBARB", "VALPROATE", "CBMZ"   .res Assessment: Plan:    Plan:  Xanax 0.25mg  TID Cymbalta 60mg  BID Abilify 5mg  daily Hydroxyzine 25mg  - take one to four tablets at hs  D/C Ambien 5mg   RTC 3 months  Patient advised to contact office with any questions, adverse effects, or acute worsening in signs and symptoms.  Discussed potential metabolic side effects associated with atypical antipsychotics, as well as potential risk for movement side effects. Advised pt to contact office if movement side effects occur.   Time spent with patient was 25 minutes. Greater than 50% of face to face time with patient was spent on counseling and coordination of care.   Diagnoses and all orders for this visit:  Insomnia, unspecified type -     ALPRAZolam (XANAX) 0.25 MG tablet; Take 1 tablet (0.25 mg total) by mouth 3 (three) times daily as needed for anxiety.  Major depressive disorder, recurrent episode,  moderate (HCC) -     ARIPiprazole (ABILIFY) 5 MG tablet; Take 1 tablet (5 mg total) by mouth daily.  Generalized anxiety disorder -     ARIPiprazole (ABILIFY) 5 MG tablet; Take 1 tablet (5 mg total) by mouth daily.     Please see After Visit Summary for patient specific instructions.  Future Appointments  Date Time Provider Department Center  08/22/2023  8:45 AM Joselyn Arrow, MD [facility]  09/17/2023  9:50 AM Gearldine Bienenstock, PA-C [facility] None    No orders of the defined types were placed in this encounter.   -------------------------------

## 2023-08-22 ENCOUNTER — Ambulatory Visit: Payer: Medicare Other | Admitting: Family Medicine

## 2023-08-22 DIAGNOSIS — Z9884 Bariatric surgery status: Secondary | ICD-10-CM

## 2023-08-22 DIAGNOSIS — Z0000 Encounter for general adult medical examination without abnormal findings: Secondary | ICD-10-CM

## 2023-08-22 DIAGNOSIS — E782 Mixed hyperlipidemia: Secondary | ICD-10-CM

## 2023-08-22 DIAGNOSIS — I10 Essential (primary) hypertension: Secondary | ICD-10-CM

## 2023-08-22 DIAGNOSIS — R7301 Impaired fasting glucose: Secondary | ICD-10-CM

## 2023-08-22 DIAGNOSIS — G4733 Obstructive sleep apnea (adult) (pediatric): Secondary | ICD-10-CM

## 2023-08-22 DIAGNOSIS — M109 Gout, unspecified: Secondary | ICD-10-CM

## 2023-08-22 DIAGNOSIS — Z23 Encounter for immunization: Secondary | ICD-10-CM

## 2023-08-22 DIAGNOSIS — M797 Fibromyalgia: Secondary | ICD-10-CM

## 2023-08-22 DIAGNOSIS — M810 Age-related osteoporosis without current pathological fracture: Secondary | ICD-10-CM

## 2023-08-22 DIAGNOSIS — R7989 Other specified abnormal findings of blood chemistry: Secondary | ICD-10-CM

## 2023-08-22 DIAGNOSIS — K219 Gastro-esophageal reflux disease without esophagitis: Secondary | ICD-10-CM

## 2023-08-23 ENCOUNTER — Telehealth: Payer: Self-pay | Admitting: Family Medicine

## 2023-08-23 NOTE — Telephone Encounter (Signed)
Do you want her to have fasting labs for her appt on Wednesday?  She is now an afternoon appt and if needs fasting labs, she wants to come in day or so before appt

## 2023-08-23 NOTE — Telephone Encounter (Signed)
She doesn't need to fast. Only A1c at visit. Just a reminder--I will have no computer access until Sunday evening. (Turning off computer and driving in a minute)

## 2023-08-27 NOTE — Progress Notes (Unsigned)
No chief complaint on file.   [name] is a 67 y.o. female who presents for annual physical exam, Medicare wellness visit and follow-up on chronic medical conditions.   She has the following concerns:     Pre-diabetes and obesity:  A1c was up to 6.3% on last check in 01/2023. She had Roux-En-Y on 09/29/2019.  She had gotten down into the 160's.  She has regained some weight, which had been partly related to issues with her feet/surgeries, and then affected her moods.  She sees GI regularly. She has some chronic nausea and heaving. She underwent EGD on 08/13/23, which was normal. Some retained staples were noted, which were removed. GERD controlled with PPI. She was noted to have elevated LFT's per Dr. Fatima Sanger labs in July 2024 (had been normal at GI earlier that month). She f/u with GI, and had further testing done. Lab Results  Component Value Date   ALT 206 (H) 06/17/2023   AST 201 (H) 06/17/2023   ALKPHOS 139 (H) 07/31/2022   BILITOT 0.2 06/17/2023   AST 28 and ALT 68 on recheck a few days later. Repeated 8/28 and AST and ALT were normal, alk phos mildly elevated at 127.   Wt Readings from Last 3 Encounters:  07/03/23 195 lb (88.5 kg)  06/17/23 197 lb 12.8 oz (89.7 kg)  04/03/23 196 lb (88.9 kg)    Hyperlipidemia follow-up:  Patient is trying to follow a low-fat, low cholesterol diet as best she can.  Eats red meat 1-2x/week. Occasional eggs, no butter or cheese.  Uses vinaigrette dressing.  Compliant with medications (atorvastatin 40mg ) and denies medication side effects.  Lipids were at goal on last check: Lab Results  Component Value Date   CHOL 110 02/06/2023   HDL 43 02/06/2023   LDLCALC 42 02/06/2023   TRIG 148 02/06/2023   CHOLHDL 2.6 02/06/2023    Hypertension: (never formally diagnosed, because had been on diuretics for fluid retention (stopped a while ago) and beta blockers for tachycardia and tremor--but high BP's noted when not taking these  medications in past).   She continues on metoprolol 50mg  BID (originally prescribed by Dr. Eden Emms, for tachycardia). Denies chest pain, palpitations/tachycardia, lightheadedness, edema. Occasional headache.  BP Readings from Last 3 Encounters:  07/03/23 131/74  06/17/23 128/81  04/03/23 110/78     She is under the care of rheumatology clinic for fibromyalgia, osteoarthritis, degenerative disc disease, chronic fatigue, osteoporosis.    She continues on cymbalta, gabapentin (also for neuropathy in feet), and robaxin 500mg  BID prn. They prescribe her armodafinil for her chronic fatigue, using it only when "very zapped out".   She uses 1/2 tablet 2-3x/week. *** Her fibromyalgia hasn't been too bad, has pain daily but reports it is manageable most of the time.   She had T12 and L1 compression fractures after a fall in 01/2020. She had worsening back pain in July/August 2022, and was found to have new, acute compression fractures. She ultimately had kyphoplasty done 10/2021 by Dr. Conchita Paris. She was started on Forteo injections by rheum in 07/2021. DEXA in 2018 was normal.  DEXA 06/2020 T-2.0 R fem neck, osteopenia Dexa was repeated 06/2023 by rheum--T-1.9 L wrist, -1.4 L fem neck. They are switching her from Forteo to a bisphosphonate. Plan is for reclast. The pharmacist from their office reached out and asked [place] to draw c-met today, prior to infusion.    Gout--she continues on allopurinol 300mg  daily, and hasn't had any flares in several years.  Lab Results  Component Value Date   LABURIC 5.1 06/17/2023      She has OSA, under the care of Dr. Mayford Knife. She is on CPAP.  Energy is improved, but still requires Nuvigil, though is using this much less often now, as reported above.   She is under the care of Dr. Delphia Grates for RLS, neuropathy (pedal paresthesias), tremor (benign essential vs accentuated physiologic tremor), migraines. She takes gabapentin for restless legs as well as her neuropathy pain.   RLS is well controlled.  She has headaches often, but not migraines.  Headaches are at her temples most of the time. Tylenol sometimes helps with HA, uses tramadol prn.   Depression:  Sees Regina at [place], continues on Cymbalta and Abilify.    Immunization History  Administered Date(s) Administered   Fluad Quad(high Dose 65+) 09/06/2022   Influenza Inj Mdck Quad Pf 09/13/2019   Influenza Split 09/17/2011, 09/03/2012, 09/04/2013   Influenza,inj,Quad PF,6+ Mos 08/16/2014, 07/27/2015, 07/31/2017, 08/13/2018, 07/28/2020   Influenza-Unspecified 10/04/2016, 07/31/2017, 09/23/2017, 09/18/2021   PFIZER Comirnaty(Gray Top)Covid-19 Tri-Sucrose Vaccine 06/01/2021   PFIZER(Purple Top)SARS-COV-2 Vaccination 02/20/2020, 03/12/2020, 09/20/2020   PNEUMOCOCCAL CONJUGATE-20 07/26/2022   Pfizer Covid-19 Vaccine Bivalent Booster 39yrs & up 02/05/2022   Pfizer(Comirnaty)Fall Seasonal Vaccine 12 years and older 01/31/2023   Tdap 01/16/2011, 06/20/2017   Zoster Recombinant(Shingrix) 02/05/2019, 07/14/2019   Last Pap smear: 03/2023 normal, per GYN Last mammogram: 02/2023 Last colonoscopy: 11/2012 at [facility] Last DEXA: 06/2023 T-1.9 L wrist, -1.4 L fem neck. Dentist: twice yearly Ophtho: yearly Exercise:    exercise bike 4-5 days/week??   Vitamin D-OH 48 in 06/2022; level was up to 71 in 05/2023, and Dr. Corliss Skains recommended lowering her dose. UPDATE ***   Thyroid: Lab Results  Component Value Date   TSH 2.406 01/22/2021   Patient Care Team: Joselyn Arrow, MD as PCP - General (Family Medicine) Wendall Stade, MD as PCP - Cardiology (Cardiology) Quintella Reichert, MD as PCP - Sleep Medicine (Cardiology) Verner Chol, [facility] (Inactive) as Pharmacist (Pharmacist) Olivia Mackie, NP as Nurse Practitioner (Gynecology) Curt Bears, MD as Referring Physician (Neurology) Pollyann Savoy, MD as Consulting Physician (Rheumatology) Violet Baldy, MD as Referring Physician (Gastroenterology) Redgie Grayer, MD as Referring Physician (Internal Medicine) Edwin Cap, DPM as Consulting Physician (Podiatry) Nelson Chimes, MD as Attending Physician (Ophthalmology) Bjorn Pippin, MD as Attending Physician (Urology) Michele Mcalpine, MD (Pulmonary Disease) Coralyn Helling, MD as Consulting Physician (Pulmonary Disease) Ollen Gross, MD as Consulting Physician (Orthopedic Surgery) Jene Every, MD as Consulting Physician (Orthopedic Surgery) Venancio Poisson, MD as Consulting Physician (Dermatology) Raymondo Band, MD as Consulting Physician (Infectious Diseases)  Psych: Yvette Rack, NP Dentist: Dr. Cheri Guppy Bariatric surgeon: Dr. Daphine Deutscher   Depression Screening:    11/05/2022    9:32 AM 09/25/2022    9:38 AM 07/26/2022    2:41 PM 07/26/2022    9:13 AM 07/24/2022    2:28 PM  Depression screen PHQ 2/9  Decreased Interest 0 0 1 0 0  Down, Depressed, Hopeless 1 0 1 1 0  PHQ - 2 Score 1 0 2 1 0  Altered sleeping   3    Tired, decreased energy   2    Change in appetite   0    Feeling bad or failure about yourself    0    Trouble concentrating   1    Moving slowly or fidgety/restless   1    Suicidal thoughts   0  PHQ-9 Score   9    Difficult doing work/chores   Somewhat difficult      Falls screen:     04/03/2023    3:27 PM 01/31/2023    3:19 PM 12/26/2022   10:32 AM 11/05/2022    9:32 AM 09/25/2022    9:38 AM  Fall Risk   Falls in the past year? 1 0 0 0 0  Number falls in past yr: 1 0 0  0  Injury with Fall? 0 0 0  0  Comment fell last night      Risk for fall due to : History of fall(s) No Fall Risks No Fall Risks No Fall Risks   Follow up  Falls evaluation completed Falls evaluation completed Falls evaluation completed      Functional Status Survey:        She has advanced directives (living will, healthcare power of attorney). Scanned in chart in 05/2019.  PMH, PSH, SH and FH were updated and reviewed.    ROS: The patient denies fever, vision changes, decreased  hearing, breast concerns, palpitations, dizziness, syncope, cough, swelling, bowel changes (goes back and forth with diarrhea and constipation, per her usual; diarrhea isn't as often as it used to be), no abdominal pain, melena, hematochezia, indigestion/heartburn, hematuria, dysuria, vaginal bleeding, discharge, odor or itch, genital lesions, weakness, suspicious skin lesions, abnormal bleeding/bruising, or enlarged lymph nodes. Rare shortness of breath with stairs--got worse since she regained some weight. Fibromyalgia unchanged, manageable.   No gout flares. Chronic urinary leakage, stable. Upper back pain, daily, manageable. Neuropathy in LE's--burning in feet, chronic. Occasional headaches, overall improved, no migraines. Moods? Foot pain?   PHYSICAL EXAM:  LMP 07/27/2006   Wt Readings from Last 3 Encounters:  07/03/23 195 lb (88.5 kg)  06/17/23 197 lb 12.8 oz (89.7 kg)  04/03/23 196 lb (88.9 kg)  06/2021 wt 181#  General Appearance:    Alert, cooperative, no distress, appears stated age  Head:    Normocephalic, without obvious abnormality, atraumatic  Eyes:    PERRL, conjunctiva/corneas clear, EOM's intact, fundi    benign  Ears:    Normal TM's and external ear canals  Nose:   Normal, no drainage or sinus tenderness  Throat:  Normal no lesions  Neck:   Supple, no lymphadenopathy;  thyroid:  no enlargement/tenderness/nodules; no carotid bruit or JVD  Back:    Spine nontender, no curvature, ROM normal, no CVA tenderness. Mild tenderness at R trapezius, though no palpable spasm.  Lungs:     Clear to auscultation bilaterally without wheezes, rales or  ronchi; respirations unlabored  Chest Wall:    No tenderness or deformity.    Heart:    Regular rate and rhythm, S1 and S2 normal, no murmur, rub or gallop  Breast Exam:    Deferred to GYN  Abdomen:     Soft, obese, nondistended, normoactive bowel sounds, no masses, no hepatosplenomegaly.  Minimally tender in epigastrium.   Genitalia:    Deferred to GYN       Extremities:   No clubbing, cyanosis or edema  Pulses:   2+ and symmetric all extremities  Skin:   Skin color, texture, turgor normal, no rashes or lesions  Lymph nodes:   Cervical, supraclavicular nodes normal  Neurologic:   Normal strength, sensation and gait; reflexes 2+ and symmetric throughout. No significant tremor noted  Psych:   Somewhat depressed mood, full range of affect, normal hygiene and grooming   ***UPDATE foot exam Tender at R trap? Epigastrium?  ASSESSMENT/PLAN:  A1c Pharmacist at Dr. Fatima Sanger requested that we do c-met today (they will be starting her on reclast infusion)   Had COVID in 06/2023, can delay COVID until November. Flu shot RSV from pharmacy if she hasn't had yet  She had EGD recently. Did she ever have colonoscopy??? Was due 11/2022  Per DEXA 06/2023 from rheum, supposed to stop Forteo and start bisphosphonates.  Was she prescribed one???   Discussed monthly self breast exams and yearly mammograms; at least 30 minutes of aerobic activity at least 5 days/week, weight-bearing exercise at least 2x/wk; proper sunscreen use reviewed; healthy diet, including goals of calcium and vitamin D intake and alcohol recommendations (less than or equal to 1 drink/day) reviewed; regular seatbelt use; changing batteries in smoke detectors, carbon monoxide detectors. Immunization recommendations discussed--continue yearly high dose flu shots COVID booster can be delayed until November due to infection in August. RSV vaccine recommended from pharmacy.  Colonoscopy is due (was due 11/2022.)  Full Code, Full Care MOST form reviewed/updated   Medicare Attestation I have personally reviewed: The patient's medical and social history Their use of alcohol, tobacco or illicit drugs Their current medications and supplements The patient's functional ability including ADLs,fall risks, home safety risks,  cognitive, and hearing and visual impairment Diet and physical activities Evidence for depression or mood disorders  The patient's weight, height, BMI have been recorded in the chart.  I have made referrals, counseling, and provided education to the patient based on review of the above and I have provided the patient with a written personalized care plan for preventive services.

## 2023-08-27 NOTE — Telephone Encounter (Signed)
Called patient to inform her that we need a CMP prior to scheduling Reclast infusion. She sees Dr. Zelphia Cairo later this week and will obtain the lab work at that time. She was also made aware of the estimated cost of the infusion with her insurance (< $200). She is aware that the bill will not be due at the time of infusion and there is an option to pay for the infusion via payment plan.   Sofie Rower, PharmD [employer] PGY1

## 2023-08-27 NOTE — Patient Instructions (Incomplete)
HEALTH MAINTENANCE RECOMMENDATIONS:  It is recommended that you get at least 30 minutes of aerobic exercise at least 5 days/week (for weight loss, you may need as much as 60-90 minutes). This can be any activity that gets your heart rate up. This can be divided in 10-15 minute intervals if needed, but try and build up your endurance at least once a week.  Weight bearing exercise is also recommended twice weekly.  Eat a healthy diet with lots of vegetables, fruits and fiber.  "Colorful" foods have a lot of vitamins (ie green vegetables, tomatoes, red peppers, etc).  Limit sweet tea, regular sodas and alcoholic beverages, all of which has a lot of calories and sugar.  Up to 1 alcoholic drink daily may be beneficial for women (unless trying to lose weight, watch sugars).  Drink a lot of water.  Calcium recommendations are 1200-1500 mg daily (1500 mg for postmenopausal women or women without ovaries), and vitamin D 1000 IU daily.  This should be obtained from diet and/or supplements (vitamins), and calcium should not be taken all at once, but in divided doses.  Monthly self breast exams and yearly mammograms for women over the age of 29 is recommended.  Sunscreen of at least SPF 30 should be used on all sun-exposed parts of the skin when outside between the hours of 10 am and 4 pm (not just when at beach or pool, but even with exercise, golf, tennis, and yard work!)  Use a sunscreen that says "broad spectrum" so it covers both UVA and UVB rays, and make sure to reapply every 1-2 hours.  Remember to change the batteries in your smoke detectors when changing your clock times in the spring and fall. Carbon monoxide detectors are recommended for your home.  Use your seat belt every time you are in a car, and please drive safely and not be distracted with cell phones and texting while driving.   [name] , Thank you for taking time to come for your Medicare Wellness Visit. I appreciate your ongoing  commitment to your health goals. Please review the following plan we discussed and let me know if I can assist you in the future.   This is a list of the screening recommended for you and due dates:  Health Maintenance  Topic Date Due   Flu Shot  06/27/2023   COVID-19 Vaccine (7 - 2023-24 season) 11/26/2023*   Mammogram  03/24/2024   Medicare Annual Wellness Visit  08/27/2024   DTaP/Tdap/Td vaccine (3 - Td or Tdap) 06/21/2027   Colon Cancer Screening  07/15/2029   Pneumonia Vaccine  Completed   DEXA scan (bone density measurement)  Completed   Hepatitis C Screening  Completed   Zoster (Shingles) Vaccine  Completed   HPV Vaccine  Aged Out  *Topic was postponed. The date shown is not the original due date.   Due to having COVID in 06/2023, you can delay getting the COVID booster until November. I recommend getting the RSV vaccine from the pharmacy (wait 2 weeks from today's vaccine).  Please discuss colon cancer screening with your gastroenterologist (per our records, it was last done in 11/2012,and you are due for screening).  Your vitamin D dose was supposed to be decreased, not stopped entirely.  I don't know what you had been taking prior to the July blood test by the rheumatologist. Typically our vitamin D levels drop a lot over the colder months (less sun exposure and shorter days). You can check with  Dr. Corliss Skains for more details regarding dose, if needed. Be sure to add in weight-bearing exercise, as we discussed.

## 2023-08-28 ENCOUNTER — Encounter: Payer: Self-pay | Admitting: Family Medicine

## 2023-08-28 ENCOUNTER — Ambulatory Visit: Payer: Medicare Other | Admitting: Family Medicine

## 2023-08-28 VITALS — BP 112/64 | HR 60 | Ht 62.0 in | Wt 190.0 lb

## 2023-08-28 DIAGNOSIS — F324 Major depressive disorder, single episode, in partial remission: Secondary | ICD-10-CM

## 2023-08-28 DIAGNOSIS — K219 Gastro-esophageal reflux disease without esophagitis: Secondary | ICD-10-CM | POA: Diagnosis not present

## 2023-08-28 DIAGNOSIS — G4733 Obstructive sleep apnea (adult) (pediatric): Secondary | ICD-10-CM | POA: Diagnosis not present

## 2023-08-28 DIAGNOSIS — Z9884 Bariatric surgery status: Secondary | ICD-10-CM

## 2023-08-28 DIAGNOSIS — Z23 Encounter for immunization: Secondary | ICD-10-CM

## 2023-08-28 DIAGNOSIS — K3184 Gastroparesis: Secondary | ICD-10-CM

## 2023-08-28 DIAGNOSIS — G2581 Restless legs syndrome: Secondary | ICD-10-CM | POA: Diagnosis not present

## 2023-08-28 DIAGNOSIS — M797 Fibromyalgia: Secondary | ICD-10-CM | POA: Diagnosis not present

## 2023-08-28 DIAGNOSIS — M810 Age-related osteoporosis without current pathological fracture: Secondary | ICD-10-CM | POA: Diagnosis not present

## 2023-08-28 DIAGNOSIS — R7301 Impaired fasting glucose: Secondary | ICD-10-CM

## 2023-08-28 DIAGNOSIS — J302 Other seasonal allergic rhinitis: Secondary | ICD-10-CM

## 2023-08-28 DIAGNOSIS — Z5181 Encounter for therapeutic drug level monitoring: Secondary | ICD-10-CM

## 2023-08-28 DIAGNOSIS — Z0000 Encounter for general adult medical examination without abnormal findings: Secondary | ICD-10-CM | POA: Diagnosis not present

## 2023-08-28 DIAGNOSIS — E782 Mixed hyperlipidemia: Secondary | ICD-10-CM | POA: Diagnosis not present

## 2023-08-28 LAB — POCT GLYCOSYLATED HEMOGLOBIN (HGB A1C): Hemoglobin A1C: 5.9 % — AB (ref 4.0–5.6)

## 2023-08-29 ENCOUNTER — Other Ambulatory Visit: Payer: Self-pay | Admitting: Pharmacist

## 2023-08-29 ENCOUNTER — Other Ambulatory Visit: Payer: Self-pay | Admitting: Rheumatology

## 2023-08-29 ENCOUNTER — Other Ambulatory Visit (HOSPITAL_COMMUNITY): Payer: Self-pay

## 2023-08-29 ENCOUNTER — Encounter: Payer: Self-pay | Admitting: Family Medicine

## 2023-08-29 DIAGNOSIS — Z8781 Personal history of (healed) traumatic fracture: Secondary | ICD-10-CM

## 2023-08-29 DIAGNOSIS — M810 Age-related osteoporosis without current pathological fracture: Secondary | ICD-10-CM

## 2023-08-29 LAB — CMP14+EGFR
ALT: 19 [IU]/L (ref 0–32)
AST: 23 [IU]/L (ref 0–40)
Albumin: 4 g/dL (ref 3.9–4.9)
Alkaline Phosphatase: 137 [IU]/L — ABNORMAL HIGH (ref 44–121)
BUN/Creatinine Ratio: 23 (ref 12–28)
BUN: 16 mg/dL (ref 8–27)
Bilirubin Total: <0.2 mg/dL (ref 0.0–1.2)
CO2: 26 mmol/L (ref 20–29)
Calcium: 9.2 mg/dL (ref 8.7–10.3)
Chloride: 101 mmol/L (ref 96–106)
Creatinine, Ser: 0.7 mg/dL (ref 0.57–1.00)
Globulin, Total: 2.5 g/dL (ref 1.5–4.5)
Glucose: 93 mg/dL (ref 70–99)
Potassium: 4.8 mmol/L (ref 3.5–5.2)
Sodium: 140 mmol/L (ref 134–144)
Total Protein: 6.5 g/dL (ref 6.0–8.5)
eGFR: 95 mL/min/{1.73_m2} (ref >59)

## 2023-08-29 MED ORDER — ARMODAFINIL 250 MG PO TABS
250.0000 mg | ORAL_TABLET | Freq: Every day | ORAL | 0 refills | Status: DC | PRN
  Filled 2023-08-29: qty 30, 30d supply, fill #0

## 2023-08-29 NOTE — Telephone Encounter (Signed)
Last Fill: 07/16/2023  Next Visit: 09/17/2023  Last Visit: 722/2024  Dx: Other fatigue-related to fibromyalgia   Current Dose per office note on 06/17/2023: Nuvigil to 50 mg p.o. every morning in January 2024 due to ongoing severe fatigue. The dose in the office note does not match the dose we have always prescribed for patient.   Okay to refill Armodafinil?

## 2023-08-29 NOTE — Progress Notes (Signed)
Patient is new start to Reclast IV [phone]) and due for updated orders. She has completed 2 years of Forteo treatment Diagnosis: age-related osteoporosis;  Dose: 5mg  IV every 12 months  Last Clinic Visit: 06/17/23 Next Clinic Visit: 09/17/2023  Labs: CMP on 08/28/2023 stable, Vitamin D on 06/17/23 wnl  Orders placed for Reclast IV ([id number]) x 1 dose along with premedication of acetaminophen and diphenhydramine to be administered 30 minutes before medication infusion.  ATC  patient and provide  with phone number for: [facility] Medical Day [phone]) - Merdis Delay, PharmD, MPH, BCPS, CPP Clinical Pharmacist (Rheumatology and Pulmonology)

## 2023-08-29 NOTE — Progress Notes (Signed)
Patient returned call. Provided her with Medical Day number to call and schedule Reclast infusion  Chesley Mires, PharmD, MPH, BCPS, CPP Clinical Pharmacist (Rheumatology and Pulmonology)

## 2023-08-29 NOTE — Telephone Encounter (Signed)
CMP from 08/28/23 stable to proceed with Reclast. Order placed w Medical DAy

## 2023-08-30 ENCOUNTER — Other Ambulatory Visit (HOSPITAL_COMMUNITY): Payer: Self-pay

## 2023-08-31 ENCOUNTER — Other Ambulatory Visit: Payer: Self-pay | Admitting: Family Medicine

## 2023-09-02 ENCOUNTER — Other Ambulatory Visit: Payer: Self-pay | Admitting: Adult Health

## 2023-09-02 DIAGNOSIS — F331 Major depressive disorder, recurrent, moderate: Secondary | ICD-10-CM

## 2023-09-02 DIAGNOSIS — F411 Generalized anxiety disorder: Secondary | ICD-10-CM

## 2023-09-03 ENCOUNTER — Encounter: Payer: Self-pay | Admitting: Pharmacist

## 2023-09-03 NOTE — Progress Notes (Deleted)
Office Visit Note  Patient: [name]             Date of Birth: 1956/11/17           MRN: [id number]             PCP: Joselyn Arrow, MD Referring: Joselyn Arrow, MD Visit Date: 09/17/2023 Occupation: @GUAROCC @  Subjective:  No chief complaint on file.   History of Present Illness: [name] is a 67 y.o. female ***     Activities of Daily Living:  Patient reports morning stiffness for *** {minute/hour:19697}.   Patient {ACTIONS;DENIES/REPORTS:21021675::"Denies"} nocturnal pain.  Difficulty dressing/grooming: {ACTIONS;DENIES/REPORTS:21021675::"Denies"} Difficulty climbing stairs: {ACTIONS;DENIES/REPORTS:21021675::"Denies"} Difficulty getting out of chair: {ACTIONS;DENIES/REPORTS:21021675::"Denies"} Difficulty using hands for taps, buttons, cutlery, and/or writing: {ACTIONS;DENIES/REPORTS:21021675::"Denies"}  No Rheumatology ROS completed.   PMFS History:  Patient Active Problem List   Diagnosis Date Noted   Skin ulcer of left great toe, limited to breakdown of skin (HCC) 09/25/2022   Medication monitoring encounter 08/29/2022   History of Clostridioides difficile colitis    Infected orthopedic implant (HCC)    Charcot's arthropathy of forefoot    Right foot pain 07/31/2022   Osteomyelitis of great toe of right foot (HCC) 07/30/2022   Bipolar disorder (HCC) 07/30/2022   Osteopenia 08/04/2021   Acute right-sided weakness 01/22/2021   Peripheral polyneuropathy 01/22/2021   History of migraine headaches 01/22/2021   Difficulty with speech    Gastric bypass status for obesity 09/29/2019   Aftercare 08/12/2018   Pain in left knee 06/03/2018   Dyspnea 12/26/2017   Restrictive lung disease secondary to obesity 12/26/2017   Atypical chest pain 12/06/2017   Sinus tachycardia 12/06/2017   Chest pain 12/06/2017   DJD (degenerative joint disease), cervical 12/24/2016   Primary osteoarthritis of both hips 12/24/2016   Primary osteoarthritis of both knees 12/24/2016   H/O  total knee replacement, right 12/24/2016   Spondylosis of lumbar region without myelopathy or radiculopathy 12/24/2016   Lumbosacral spondylosis without myelopathy 12/24/2016   Acute gout 05/30/2016   Gout 05/30/2016   Myalgia 03/29/2016   Other long term (current) drug therapy 03/29/2016   Idiopathic peripheral neuropathy 03/29/2016   Cannot sleep 03/29/2016   Migraine without aura and responsive to treatment 03/29/2016   Multifocal myoclonus 03/29/2016   Restless leg 03/29/2016   Has a tremor 03/29/2016   History of aspiration pneumonitis 01/25/2016   History of acute bronchitis 01/25/2016   LPRD (laryngopharyngeal reflux disease) 01/25/2016   Imbalance 01/09/2016   Serotonin syndrome 12/22/2015   Essential hypertension 09/10/2015   OA (osteoarthritis) of knee 09/05/2015   Obesity 05/17/2015   OSA (obstructive sleep apnea) 03/16/2013   Iron deficiency 11/06/2012   Thrombocythemia 11/06/2012   Leukocytosis 11/05/2012   Impaired fasting glucose 07/23/2012   Bronchitis 04/03/2012   Kidney stone on left side 03/05/2012   Kidney cysts 03/04/2012   Loose stools 03/04/2012   Urinary frequency 12/04/2011   Restless leg syndrome 12/04/2011   Polypharmacy 12/04/2011   Bladder incontinence 12/04/2011   Fibromyalgia    Mixed hyperlipidemia    Edema    S/P endometrial ablation    Allergic rhinitis 09/18/2011   Depression, major, single episode, in partial remission (HCC) 04/21/2011   PRECORDIAL PAIN 02/17/2010   Anxiety state 01/30/2010   Migraine 01/30/2010   GERD without esophagitis 01/30/2010   Gastroparesis 01/30/2010    Past Medical History:  Diagnosis Date   Anemia    previously followed by Dr. Dalene Carrow for anemia and elevated platelets  Anxiety    C. difficile colitis 10/01/2012   treated by [facility]   Chronic fatigue syndrome    Closed wedge compression fracture of T8 vertebra (HCC) 06/2021   DDD (degenerative disc disease), lumbar 08/19/2014   and facet arthroplasty  & left lumbar radiculopathy (Dr.Ramos)   Depression    Dyssynergia    dyssynergenic defecation, contributing to fecal incontinence.   Edema    Fibromyalgia    Gastroparesis    followed at [facility]   GERD (gastroesophageal reflux disease)    History of kidney stones    History of vertebral fracture 06/30/2021   Hyperlipidemia    Kidney stone    Lumbar radiculopathy    Migraine    Neuropathy    Obstructive sleep apnea    Does  wear  CPAP   Osteoporosis    Paresthesia    Dr. Antonietta Barcelona at [facility]   Pelvic floor dysfunction    pelvic floor dyssynergy   Plantar fasciitis 02/2011   R foot   Pneumonia    2012   PONV (postoperative nausea and vomiting)    pt states has gastroparesis has difficulty taking antibiotics and narcotics has severe nausea and vomiting    Restless leg syndrome    S/P endometrial ablation 08/09/2006   Novasure Ablation   S/P epidural steroid injection 09/20/2014   Dr.Ramos   Tremor    Dr. Antonietta Barcelona   Urinary frequency    Urinary incontinence     Family History  Problem Relation Age of Onset   Allergies Mother    Hypertension Mother    Heart disease Mother        possible valve problem - leaking valve   Macular degeneration Mother    Macular degeneration Father    Heart disease Father        pacemaker, CHF   Hypertension Father    Diabetes Father        borderline   Stroke Father 62   Kidney disease Father    Allergies Sister    Asthma Sister    Irritable bowel syndrome Sister    Macular degeneration Sister    Heart disease Maternal Grandmother    Heart disease Maternal Grandfather    Heart disease Paternal Grandmother    Heart disease Paternal Grandfather    Cancer Maternal Aunt        leukemia   Cancer Maternal Aunt    Colon cancer Maternal Aunt        late 60's   CAD Neg Hx    Past Surgical History:  Procedure Laterality Date   CHOLECYSTECTOMY  07/2004   ENDOMETRIAL ABLATION  08/09/2006   Dr. Kingsley Plan Ablation    FACET JOINT INJECTION  04/17/2017   Left L4-5 and L5-S1   FOOT SURGERY Right 06/29/2022   FUSION OF JOINT IN BIG TOE RT FOOT, Dr. Lilian Kapur   GASTRIC ROUX-EN-Y N/A 09/29/2019   Procedure: LAPAROSCOPIC ROUX-EN-Y GASTRIC BYPASS WITH UPPER ENDOSCOPY, ERAS Pathway;  Surgeon: Luretha Murphy, MD;  Location: [facility];  Service: General;  Laterality: N/A;   IRRIGATION AND DEBRIDEMENT FOOT Right 08/03/2022   Procedure: foot bone biopsy and hardware removal;  Surgeon: Edwin Cap, DPM;  Location: [facility];  Service: Podiatry;  Laterality: Right;   KNEE ARTHROPLASTY     KNEE SURGERY  1999   R knee, Dr. Renae Fickle, torn cartilage   KYPHOPLASTY  11/06/2021   [id number]-[id number]   RETINAL LASER PROCEDURE Right 08/28/2018   laser retinopexy   RIGHT/LEFT  HEART CATH AND CORONARY ANGIOGRAPHY N/A 01/01/2018   Procedure: RIGHT/LEFT HEART CATH AND CORONARY ANGIOGRAPHY;  Surgeon: Tonny Bollman, MD;  Location[facility] INVASIVE CV LAB;  Service: Cardiovascular;  Laterality: N/A;   TONSILLECTOMY  1968   TONSILLECTOMY     TOTAL KNEE ARTHROPLASTY Right 09/05/2015   Procedure: RIGHT TOTAL KNEE ARTHROPLASTY;  Surgeon: Ollen Gross, MD;  Location: [facility];  Service: Orthopedics;  Laterality: Right;   TOTAL KNEE ARTHROPLASTY Left 07/14/2018   Procedure: LEFT TOTAL KNEE ARTHROPLASTY;  Surgeon: Ollen Gross, MD;  Location: [facility];  Service: Orthopedics;  Laterality: Left;   ULTRASOUND GUIDANCE FOR VASCULAR ACCESS  01/01/2018   Procedure: Ultrasound Guidance For Vascular Access;  Surgeon: Tonny Bollman, MD;  Location: [facility] INVASIVE CV LAB;  Service: Cardiovascular;;   UMBILICAL HERNIA REPAIR N/A 09/29/2019   Procedure: HERNIA REPAIR UMBILICAL ADULT;  Surgeon: Luretha Murphy, MD;  Location: [facility];  Service: General;  Laterality: N/A;   Social History   Social History Narrative   Married, 1 son in [place] (grandson born 06/2017), 1 stepson in [place], with 2 children      Updated 07/2023   Immunization History  Administered  Date(s) Administered   Fluad Quad(high Dose 65+) 09/06/2022   Fluad Trivalent(High Dose 65+) 08/28/2023   Influenza Inj Mdck Quad Pf 09/13/2019   Influenza Split 09/17/2011, 09/03/2012, 09/04/2013   Influenza,inj,Quad PF,6+ Mos 08/16/2014, 07/27/2015, 07/31/2017, 08/13/2018, 07/28/2020   Influenza-Unspecified 10/04/2016, 07/31/2017, 09/23/2017, 09/18/2021   PFIZER Comirnaty(Gray Top)Covid-19 Tri-Sucrose Vaccine 06/01/2021   PFIZER(Purple Top)SARS-COV-2 Vaccination 02/20/2020, 03/12/2020, 09/20/2020   PNEUMOCOCCAL CONJUGATE-20 07/26/2022   Pfizer Covid-19 Vaccine Bivalent Booster 75yrs & up 02/05/2022   Pfizer(Comirnaty)Fall Seasonal Vaccine 12 years and older 01/31/2023   Tdap 01/16/2011, 06/20/2017   Zoster Recombinant(Shingrix) 02/05/2019, 07/14/2019     Objective: Vital Signs: LMP 07/27/2006    Physical Exam   Musculoskeletal Exam: ***  CDAI Exam: CDAI Score: -- Patient Global: --; Provider Global: -- Swollen: --; Tender: -- Joint Exam 09/17/2023   No joint exam has been documented for this visit   There is currently no information documented on the homunculus. Go to the Rheumatology activity and complete the homunculus joint exam.  Investigation: No additional findings.  Imaging: No results found.  Recent Labs: Lab Results  Component Value Date   WBC 6.9 06/17/2023   HGB 12.4 06/17/2023   PLT 316 06/17/2023   NA 140 08/28/2023   K 4.8 08/28/2023   CL 101 08/28/2023   CO2 26 08/28/2023   GLUCOSE 93 08/28/2023   BUN 16 08/28/2023   CREATININE 0.70 08/28/2023   BILITOT <0.2 08/28/2023   ALKPHOS 137 (H) 08/28/2023   AST 23 08/28/2023   ALT 19 08/28/2023   PROT 6.5 08/28/2023   ALBUMIN 4.0 08/28/2023   CALCIUM 9.2 08/28/2023   GFRAA 81 11/07/2020    Speciality Comments: Forteo started 08/07/21 (first dose in clinic)  Procedures:  No procedures performed Allergies: Erythromycin, Meperidine hcl, Polyethyl glyc-propyl glyc pf, Polyethylene glycol 3350,  Zolpidem, and Dilaudid [hydromorphone hcl]   Assessment / Plan:     Visit Diagnoses: Age-related osteoporosis without current pathological fracture  History of vertebral compression fracture  Vitamin D deficiency  Idiopathic chronic gout of multiple sites without tophus  Medication monitoring encounter  Elevated LFTs  Primary osteoarthritis of both hands  Primary osteoarthritis of both hips  History of total knee replacement, bilateral  Primary osteoarthritis of both feet  Achilles tendinitis, right leg  DDD (degenerative disc disease), cervical  Degeneration of  intervertebral disc of lumbar region without discogenic back pain or lower extremity pain  Fibromyalgia  Other fatigue  Gastroesophageal reflux disease without esophagitis  History of depression  History of migraine  History of sleep apnea  Orders: No orders of the defined types were placed in this encounter.  No orders of the defined types were placed in this encounter.   Face-to-face time spent with patient was *** minutes. Greater than 50% of time was spent in counseling and coordination of care.  Follow-Up Instructions: No follow-ups on file.   Gearldine Bienenstock, PA-C  Note - This record has been created using Dragon software.  Chart creation errors have been sought, but may not always  have been located. Such creation errors do not reflect on  the standard of medical care.

## 2023-09-03 NOTE — Progress Notes (Signed)
Spoke with patient - she forgot to schedule. MyChart message sent to patient with scheduling information.  Chesley Mires, PharmD, MPH, BCPS, CPP Clinical Pharmacist (Rheumatology and Pulmonology)

## 2023-09-03 NOTE — Progress Notes (Signed)
Reclast scheduled for 09/09/2023

## 2023-09-05 DIAGNOSIS — H524 Presbyopia: Secondary | ICD-10-CM | POA: Diagnosis not present

## 2023-09-05 DIAGNOSIS — H353131 Nonexudative age-related macular degeneration, bilateral, early dry stage: Secondary | ICD-10-CM | POA: Diagnosis not present

## 2023-09-05 DIAGNOSIS — H04123 Dry eye syndrome of bilateral lacrimal glands: Secondary | ICD-10-CM | POA: Diagnosis not present

## 2023-09-05 DIAGNOSIS — H5213 Myopia, bilateral: Secondary | ICD-10-CM | POA: Diagnosis not present

## 2023-09-05 DIAGNOSIS — Z79899 Other long term (current) drug therapy: Secondary | ICD-10-CM | POA: Diagnosis not present

## 2023-09-05 DIAGNOSIS — H2513 Age-related nuclear cataract, bilateral: Secondary | ICD-10-CM | POA: Diagnosis not present

## 2023-09-05 DIAGNOSIS — H52223 Regular astigmatism, bilateral: Secondary | ICD-10-CM | POA: Diagnosis not present

## 2023-09-06 ENCOUNTER — Other Ambulatory Visit: Payer: Self-pay | Admitting: Family Medicine

## 2023-09-09 ENCOUNTER — Ambulatory Visit (HOSPITAL_COMMUNITY)
Admission: RE | Admit: 2023-09-09 | Discharge: 2023-09-09 | Disposition: A | Discharge status: Home / Self Care | Payer: Medicare Other | Source: Ambulatory Visit | Attending: Rheumatology | Admitting: Rheumatology

## 2023-09-09 DIAGNOSIS — M810 Age-related osteoporosis without current pathological fracture: Secondary | ICD-10-CM | POA: Diagnosis not present

## 2023-09-09 DIAGNOSIS — Z8781 Personal history of (healed) traumatic fracture: Secondary | ICD-10-CM | POA: Insufficient documentation

## 2023-09-09 MED ORDER — DIPHENHYDRAMINE HCL 25 MG PO CAPS
ORAL_CAPSULE | ORAL | Status: AC
  Administered 2023-09-09: 25 mg via ORAL
  Filled 2023-09-09: qty 1

## 2023-09-09 MED ORDER — ZOLEDRONIC ACID 5 MG/100ML IV SOLN: 5.0000 mg | Freq: Once | INTRAVENOUS | Status: AC

## 2023-09-09 MED ORDER — ZOLEDRONIC ACID 5 MG/100ML IV SOLN
INTRAVENOUS | Status: AC
  Administered 2023-09-09: 5 mg via INTRAVENOUS
  Filled 2023-09-09: qty 100

## 2023-09-09 MED ORDER — ACETAMINOPHEN 325 MG PO TABS
ORAL_TABLET | ORAL | Status: AC
  Administered 2023-09-09: 650 mg via ORAL
  Filled 2023-09-09: qty 2

## 2023-09-09 MED ORDER — ACETAMINOPHEN 325 MG PO TABS: 650.0000 mg | ORAL_TABLET | Freq: Once | ORAL | Status: AC

## 2023-09-09 MED ORDER — DIPHENHYDRAMINE HCL 25 MG PO CAPS: 25.0000 mg | ORAL_CAPSULE | Freq: Once | ORAL | Status: AC

## 2023-09-09 NOTE — Addendum Note (Signed)
Addended by: Murrell Redden on: 09/09/2023 09:55 AM   Modules accepted: Orders

## 2023-09-10 IMAGING — MR MR HEEL *R* W/O CM
5 series · 40 of 40 positions shown · non-contrast
Comparison: Right foot radiographs 11/30/2021 and 08/24/2021; MRI
right forefoot 04/03/2021

CLINICAL DATA: Achilles tendinitis.

EXAM:
MR OF THE RIGHT HEEL WITHOUT CONTRAST
TECHNIQUE: Multiplanar, multisequence MR imaging of the right ankle was
performed. No intravenous contrast was administered.

[Series 4: T1 · axial · 3.0mm · 0.50mm/px · z∈[-68,+69]mm · 9 of 36 slices shown (1 of 2)]
[im 1/36]
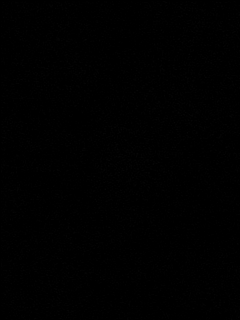
[im 5/36]
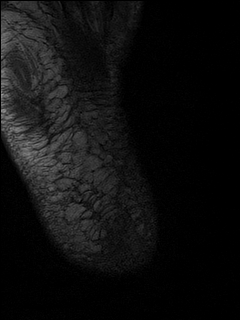
[im 9/36]
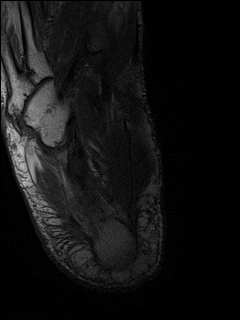
[im 14/36]
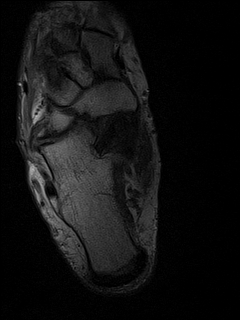
[im 18/36]
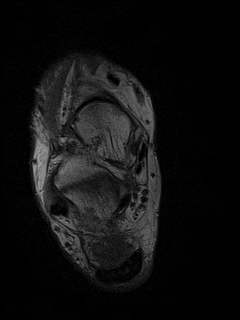
[im 22/36]
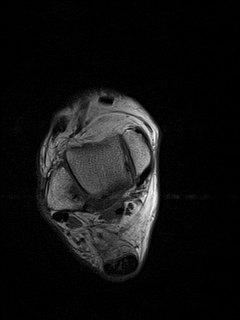
[im 27/36]
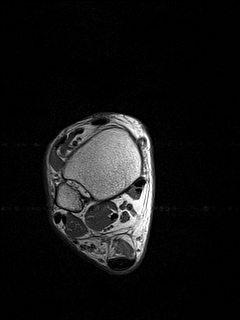
[im 31/36]
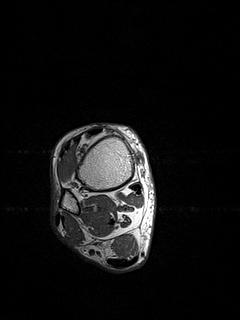
[im 36/36]
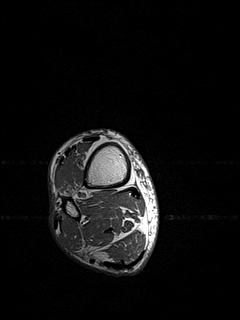

[Series 5: T1 · sagittal · 4.0mm · 0.56mm/px · 6 of 22 slices shown (2 of 2)]
[im 1/22]
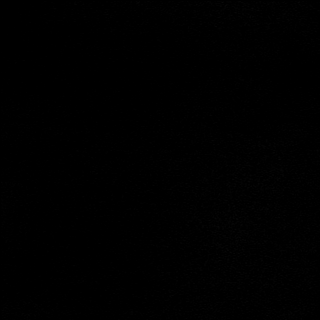
[im 5/22]
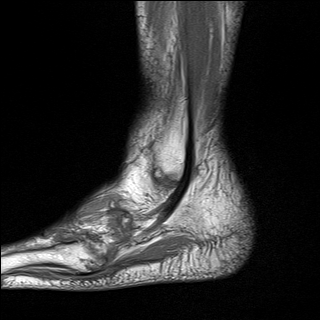
[im 9/22]
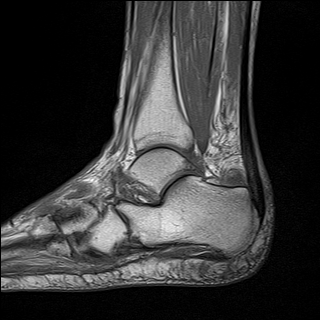
[im 13/22]
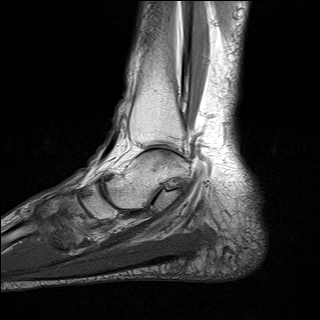
[im 17/22]
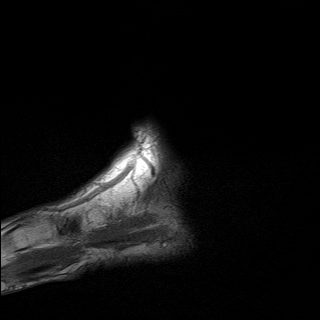
[im 22/22]
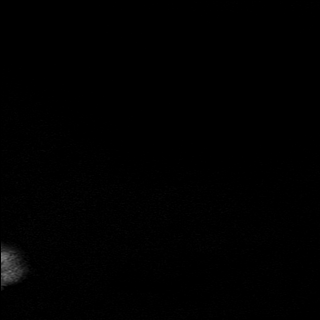

[Series 6: T2 fat-sat · axial · 3.0mm · 0.62mm/px · z∈[-68,+68]mm · 10 of 36 slices shown (1 of 2)]
[im 1/36]
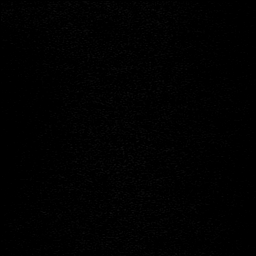
[im 4/36]
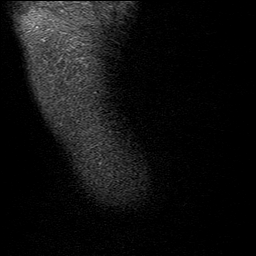
[im 8/36]
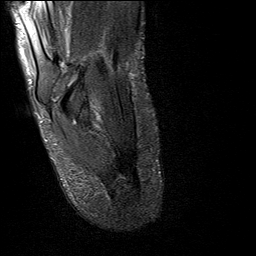
[im 12/36]
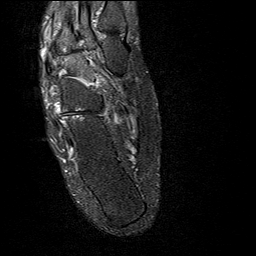
[im 16/36]
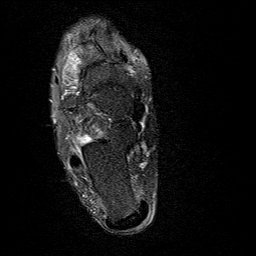
[im 20/36]
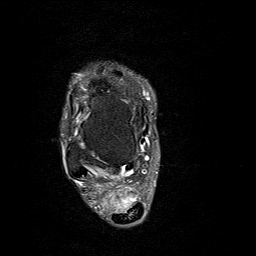
[im 24/36]
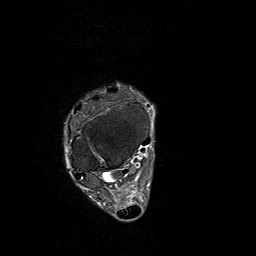
[im 28/36]
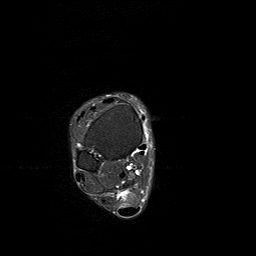
[im 32/36]
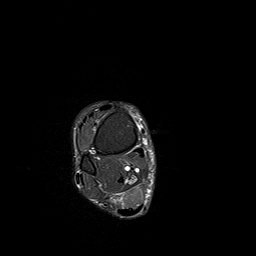
[im 36/36]
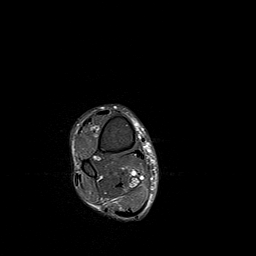

[Series 7: STIR · sagittal · 4.0mm · 0.35mm/px · 6 of 22 slices shown]
[im 1/22]
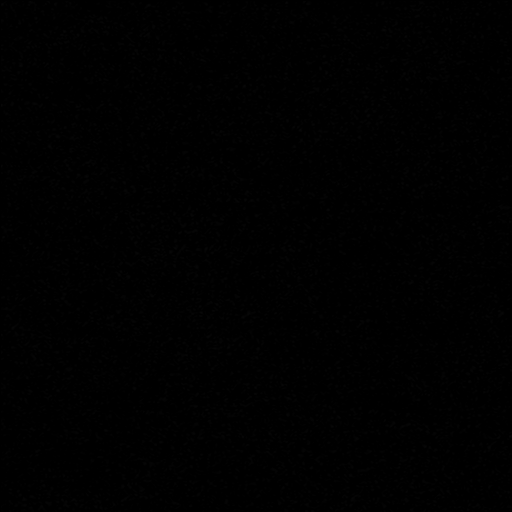
[im 5/22]
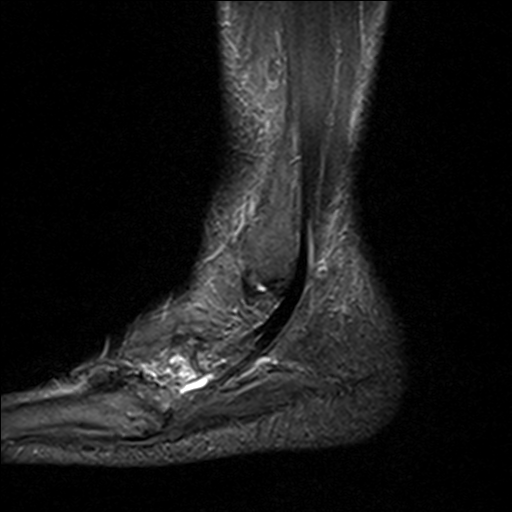
[im 9/22]
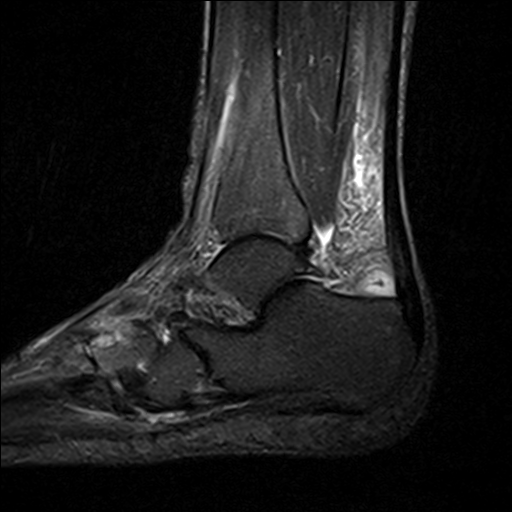
[im 13/22]
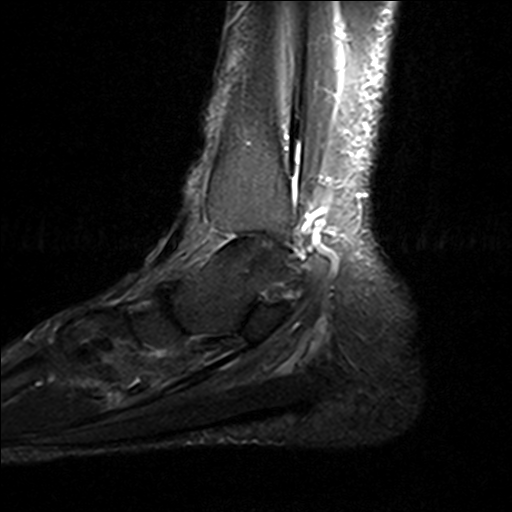
[im 17/22]
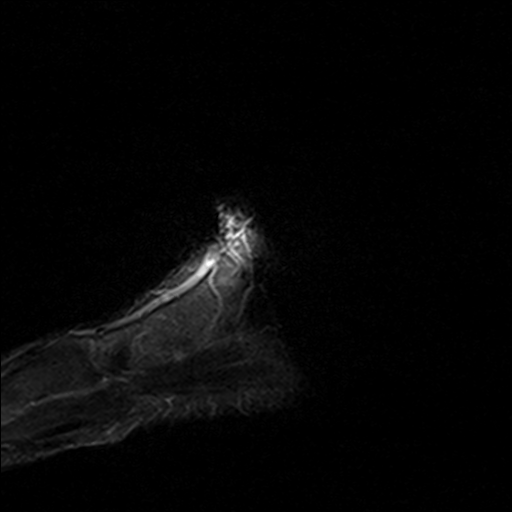
[im 22/22]
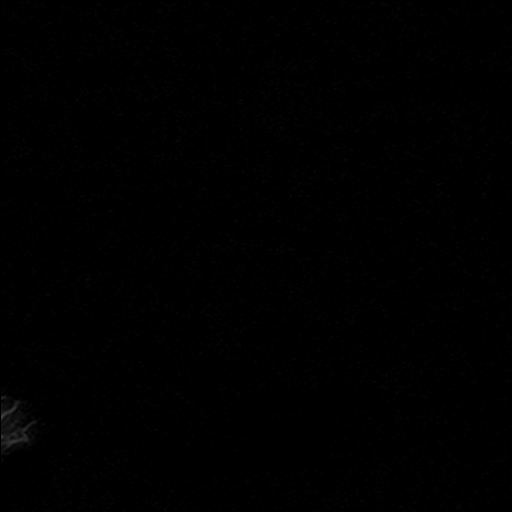

[Series 8: T2 fat-sat · coronal · 3.0mm · 0.50mm/px · 9 of 35 slices shown (2 of 2)]
[im 1/35]
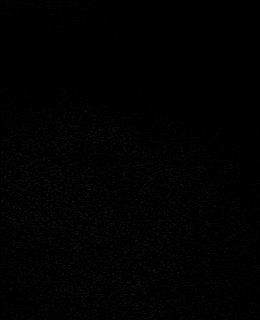
[im 5/35]
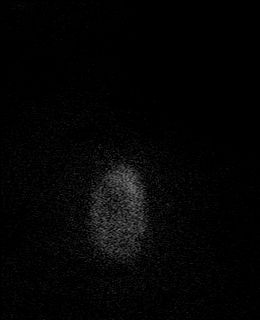
[im 9/35]
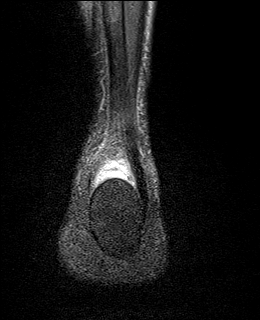
[im 13/35]
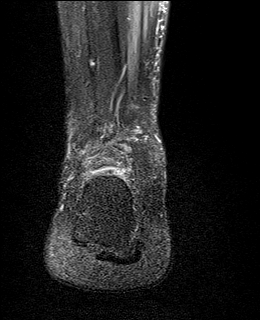
[im 18/35]
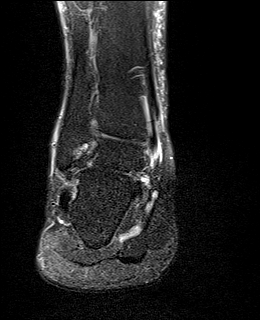
[im 22/35]
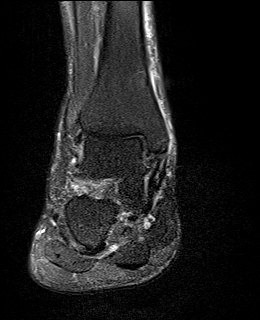
[im 26/35]
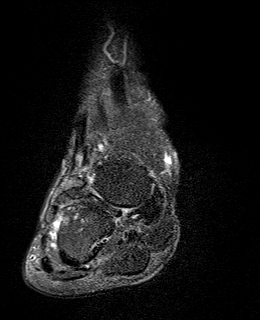
[im 30/35]
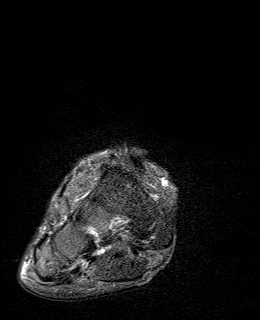
[im 35/35]
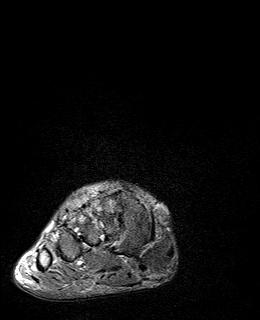

[40 of 40 positions shown; findings below may reference images not displayed]

FINDINGS: TENDONS

Peroneal: The peroneus longus and brevis tendons are intact.

Posteromedial: Mild posterior tibial and flexor digitorum longus
tenosynovitis at the level of the talus and more proximally. Mild
flexor hallucis longus tenosynovitis at the level of the tibiotalar
joint.

Anterior: The tibialis anterior, extensor hallucis longus, and
extensor digitorum longus tendons are intact.

Achilles: Mild to moderate intermediate T2 signal and thickening
tendinosis of the distal Achilles tendon. Mild-to-moderate fluid
within the pre-Achilles bursa. No Achilles fluid bright tear or
tendon retraction.

Plantar Fascia: Intact.  Tiny plantar calcaneal heel spur.

LIGAMENTS

Lateral: There is mild intermediate T2 signal and attenuation of the
anterior talofibular ligament (axial series 6 images 17 and 18)
likely remote partial-thickness tear. The calcaneofibular posterior
talofibular and anterior and posterior tibiofibular ligaments are
intact. There is mild chronic cystic change within the fibula at the
superior aspect of the posterior talofibular ligament insertion.

Medial: The tibiotalar deep deltoid and tibial spring ligaments are
intact.

CARTILAGE

Ankle Joint: There is partial-thickness cartilage loss of the far
medial aspect of the talar dome with mild subchondral marrow edema
in the region measuring up to approximately 3 mm in transverse
dimension and 5 mm in AP dimension. There is also partial-thickness
cartilage loss within the adjacent medial tibial plafond.

Subtalar Joints/Sinus Tarsi: Fat is preserved within sinus tarsi.

Bones: Moderate cartilage thinning and degenerative osteophytosis at
the dorsal aspect of the talonavicular joint. Mild-to-moderate
navicular-cuneiform osteoarthritis. Moderate to high-grade
tarsometatarsal osteoarthritis, greatest within the third
tarsometatarsal joint where there is high-grade cartilage thinning
and subchondral degenerative cystic change. Moderate degenerative
changes of the calcaneocuboid articulation.

Other: The tarsal tunnel is unremarkable. The Lisfranc ligament
complex is intact.
IMPRESSION: 1. Mild-to-moderate distal Achilles insertional tendinosis.
Mild-to-moderate fluid within the pre Achilles bursa.
2. Remote partial-thickness tear of the anterior talofibular
ligament.
3. Partial-thickness cartilage loss within the medial aspect of the
tibial plafond and talar dome with mild talar dome subchondral
cystic change.
4. Moderate midfoot osteoarthritis, greatest within the third,
second, and fourth tarsometatarsal joints.

## 2023-09-15 DIAGNOSIS — H2513 Age-related nuclear cataract, bilateral: Secondary | ICD-10-CM | POA: Diagnosis not present

## 2023-09-15 DIAGNOSIS — H15102 Unspecified episcleritis, left eye: Secondary | ICD-10-CM | POA: Diagnosis not present

## 2023-09-15 DIAGNOSIS — H43813 Vitreous degeneration, bilateral: Secondary | ICD-10-CM | POA: Diagnosis not present

## 2023-09-17 ENCOUNTER — Ambulatory Visit: Payer: Medicare Other | Admitting: Physician Assistant

## 2023-09-17 DIAGNOSIS — M51369 Other intervertebral disc degeneration, lumbar region without mention of lumbar back pain or lower extremity pain: Secondary | ICD-10-CM

## 2023-09-17 DIAGNOSIS — Z5181 Encounter for therapeutic drug level monitoring: Secondary | ICD-10-CM

## 2023-09-17 DIAGNOSIS — M19041 Primary osteoarthritis, right hand: Secondary | ICD-10-CM

## 2023-09-17 DIAGNOSIS — M19071 Primary osteoarthritis, right ankle and foot: Secondary | ICD-10-CM

## 2023-09-17 DIAGNOSIS — M810 Age-related osteoporosis without current pathological fracture: Secondary | ICD-10-CM

## 2023-09-17 DIAGNOSIS — Z96653 Presence of artificial knee joint, bilateral: Secondary | ICD-10-CM

## 2023-09-17 DIAGNOSIS — H04123 Dry eye syndrome of bilateral lacrimal glands: Secondary | ICD-10-CM | POA: Diagnosis not present

## 2023-09-17 DIAGNOSIS — R7989 Other specified abnormal findings of blood chemistry: Secondary | ICD-10-CM

## 2023-09-17 DIAGNOSIS — R5383 Other fatigue: Secondary | ICD-10-CM

## 2023-09-17 DIAGNOSIS — M1A09X0 Idiopathic chronic gout, multiple sites, without tophus (tophi): Secondary | ICD-10-CM

## 2023-09-17 DIAGNOSIS — M5030 Other cervical disc degeneration, unspecified cervical region: Secondary | ICD-10-CM

## 2023-09-17 DIAGNOSIS — Z8659 Personal history of other mental and behavioral disorders: Secondary | ICD-10-CM

## 2023-09-17 DIAGNOSIS — Z8781 Personal history of (healed) traumatic fracture: Secondary | ICD-10-CM

## 2023-09-17 DIAGNOSIS — E559 Vitamin D deficiency, unspecified: Secondary | ICD-10-CM

## 2023-09-17 DIAGNOSIS — Z8669 Personal history of other diseases of the nervous system and sense organs: Secondary | ICD-10-CM

## 2023-09-17 DIAGNOSIS — M7661 Achilles tendinitis, right leg: Secondary | ICD-10-CM

## 2023-09-17 DIAGNOSIS — M160 Bilateral primary osteoarthritis of hip: Secondary | ICD-10-CM

## 2023-09-17 DIAGNOSIS — M797 Fibromyalgia: Secondary | ICD-10-CM

## 2023-09-17 DIAGNOSIS — K219 Gastro-esophageal reflux disease without esophagitis: Secondary | ICD-10-CM

## 2023-09-25 DIAGNOSIS — R7989 Other specified abnormal findings of blood chemistry: Secondary | ICD-10-CM | POA: Diagnosis not present

## 2023-09-25 DIAGNOSIS — K760 Fatty (change of) liver, not elsewhere classified: Secondary | ICD-10-CM | POA: Diagnosis not present

## 2023-09-25 DIAGNOSIS — R748 Abnormal levels of other serum enzymes: Secondary | ICD-10-CM | POA: Diagnosis not present

## 2023-09-26 ENCOUNTER — Other Ambulatory Visit: Payer: Self-pay | Admitting: Physician Assistant

## 2023-09-26 NOTE — Telephone Encounter (Signed)
Last Fill: 05/20/2023  Next Visit: Due October 2024. Message sent to the front to schedule.   Last Visit: 06/17/2023  Dx: Fibromyalgia   Current Dose per office note on 06/17/2023: not discussed  Okay to refill Methocarbamol?

## 2023-09-26 NOTE — Telephone Encounter (Signed)
Please schedule patient a follow up visit. Patient due October 2024. Thanks!   Follow-Up Instructions: Return in about 3 months (around 09/17/2023) for Gout, Osteoarthritis, Osteoporosis.

## 2023-09-27 ENCOUNTER — Other Ambulatory Visit: Payer: Self-pay | Admitting: Adult Health

## 2023-09-30 DIAGNOSIS — G43009 Migraine without aura, not intractable, without status migrainosus: Secondary | ICD-10-CM | POA: Diagnosis not present

## 2023-10-02 ENCOUNTER — Other Ambulatory Visit: Payer: Self-pay | Admitting: Adult Health

## 2023-10-02 DIAGNOSIS — F411 Generalized anxiety disorder: Secondary | ICD-10-CM

## 2023-10-02 DIAGNOSIS — F331 Major depressive disorder, recurrent, moderate: Secondary | ICD-10-CM

## 2023-10-03 DIAGNOSIS — Z9049 Acquired absence of other specified parts of digestive tract: Secondary | ICD-10-CM | POA: Diagnosis not present

## 2023-10-03 DIAGNOSIS — K760 Fatty (change of) liver, not elsewhere classified: Secondary | ICD-10-CM | POA: Diagnosis not present

## 2023-10-03 DIAGNOSIS — N281 Cyst of kidney, acquired: Secondary | ICD-10-CM | POA: Diagnosis not present

## 2023-10-03 DIAGNOSIS — R748 Abnormal levels of other serum enzymes: Secondary | ICD-10-CM | POA: Diagnosis not present

## 2023-10-03 DIAGNOSIS — Z9884 Bariatric surgery status: Secondary | ICD-10-CM | POA: Diagnosis not present

## 2023-10-03 DIAGNOSIS — K838 Other specified diseases of biliary tract: Secondary | ICD-10-CM | POA: Diagnosis not present

## 2023-10-07 NOTE — Progress Notes (Signed)
Office Visit Note  Patient: [name]             Date of Birth: [birth year]           MRN: [id number]             PCP: Leah Arrow, MD Referring: Leah Arrow, MD Visit Date: 10/21/2023 Occupation: @GUAROCC @  Subjective:  Right trapezius muscle tension and tenderness   History of Present Illness: [name] is a 67 y.o. female with history of osteoporosis, gout, and osteoarthritis.  Patient initiated the first IV reclast infusion on 09/09/23 after completing forteo treatment x2 years.  Patient states for 24 to 36 hours she was experiencing increased total body pain.  Patient states that the discomfort was more severe than flulike symptoms but were short-lived.  She continues to take calcium 500 mg 3 times daily.   She denies any signs or symptoms of a gout flare.  She is taking allopurinol 300 mg 1 tablet daily and colchicine 0.6 mg 1 tablet daily as needed during flares.  She has not needed to take colchicine recently. Patient presents today with increased trapezius muscle tension and tenderness bilaterally, right greater than left.  She has been under tremendous amount of stress caring for both her mother and her husband off and on since August 2024.  Patient requested a right trapezius trigger point injection today which has helped to alleviate her symptoms in the past.  She took methocarbamol this morning which has provided mild relief.  She remains on Cymbalta as prescribed.  Patient continues to have difficulty sleeping at night due to nocturnal pain.  She continues to have chronic fatigue and daytime drowsiness which she attributes to insomnia.  She takes armodafinil daily.    Activities of Daily Living:  Patient reports morning stiffness for 0 minutes.   Patient Denies nocturnal pain.  Difficulty dressing/grooming: Denies Difficulty climbing stairs: Denies Difficulty getting out of chair: Denies Difficulty using hands for taps, buttons, cutlery, and/or writing: Denies  Review  of Systems  Constitutional:  Positive for fatigue.  HENT:  Positive for mouth sores and mouth dryness.   Eyes:  Positive for dryness. Negative for pain, itching and visual disturbance.  Respiratory:  Negative for shortness of breath.   Cardiovascular:  Negative for chest pain and palpitations.  Gastrointestinal:  Positive for diarrhea. Negative for blood in stool and constipation.  Endocrine: Negative for increased urination.  Genitourinary:  Positive for involuntary urination.  Musculoskeletal:  Positive for joint pain, joint pain, myalgias, muscle weakness, muscle tenderness and myalgias. Negative for gait problem, joint swelling and morning stiffness.  Skin:  Positive for hair loss. Negative for color change, rash and sensitivity to sunlight.  Allergic/Immunologic: Negative for susceptible to infections.  Neurological:  Positive for headaches. Negative for dizziness.  Hematological:  Negative for swollen glands.  Psychiatric/Behavioral:  Positive for sleep disturbance. Negative for depressed mood. The patient is nervous/anxious.     PMFS History:  Patient Active Problem List   Diagnosis Date Noted  . Skin ulcer of left great toe, limited to breakdown of skin (HCC) 09/25/2022  . Medication monitoring encounter 08/29/2022  . History of Clostridioides difficile colitis   . Infected orthopedic implant (HCC)   . Charcot's arthropathy of forefoot   . Right foot pain 07/31/2022  . Osteomyelitis of great toe of right foot (HCC) 07/30/2022  . Bipolar disorder (HCC) 07/30/2022  . Osteopenia 08/04/2021  . Acute right-sided weakness 01/22/2021  . Peripheral polyneuropathy 01/22/2021  .  History of migraine headaches 01/22/2021  . Difficulty with speech   . Gastric bypass status for obesity 09/29/2019  . Aftercare 08/12/2018  . Pain in left knee 06/03/2018  . Dyspnea 12/26/2017  . Restrictive lung disease secondary to obesity 12/26/2017  . Atypical chest pain 12/06/2017  . Sinus  tachycardia 12/06/2017  . Chest pain 12/06/2017  . DJD (degenerative joint disease), cervical 12/24/2016  . Primary osteoarthritis of both hips 12/24/2016  . Primary osteoarthritis of both knees 12/24/2016  . H/O total knee replacement, right 12/24/2016  . Spondylosis of lumbar region without myelopathy or radiculopathy 12/24/2016  . Lumbosacral spondylosis without myelopathy 12/24/2016  . Acute gout 05/30/2016  . Gout 05/30/2016  . Myalgia 03/29/2016  . Other long term (current) drug therapy 03/29/2016  . Idiopathic peripheral neuropathy 03/29/2016  . Cannot sleep 03/29/2016  . Migraine without aura and responsive to treatment 03/29/2016  . Multifocal myoclonus 03/29/2016  . Restless leg 03/29/2016  . Has a tremor 03/29/2016  . History of aspiration pneumonitis 01/25/2016  . History of acute bronchitis 01/25/2016  . LPRD (laryngopharyngeal reflux disease) 01/25/2016  . Imbalance 01/09/2016  . Serotonin syndrome 12/22/2015  . Essential hypertension 09/10/2015  . OA (osteoarthritis) of knee 09/05/2015  . Obesity 05/17/2015  . OSA (obstructive sleep apnea) 03/16/2013  . Iron deficiency 11/06/2012  . Thrombocythemia 11/06/2012  . Leukocytosis 11/05/2012  . Impaired fasting glucose 07/23/2012  . Bronchitis 04/03/2012  . Kidney stone on left side 03/05/2012  . Kidney cysts 03/04/2012  . Loose stools 03/04/2012  . Urinary frequency 12/04/2011  . Restless leg syndrome 12/04/2011  . Polypharmacy 12/04/2011  . Bladder incontinence 12/04/2011  . Fibromyalgia   . Mixed hyperlipidemia   . Edema   . S/P endometrial ablation   . Allergic rhinitis 09/18/2011  . Depression, major, single episode, in partial remission (HCC) 04/21/2011  . PRECORDIAL PAIN 02/17/2010  . Anxiety state 01/30/2010  . Migraine 01/30/2010  . GERD without esophagitis 01/30/2010  . Gastroparesis 01/30/2010    Past Medical History:  Diagnosis Date  . Anemia    previously followed by Dr. Dalene Carrow for anemia  and elevated platelets  . Anxiety   . C. difficile colitis 10/01/2012   treated by [facility]  . Chronic fatigue syndrome   . Closed wedge compression fracture of T8 vertebra (HCC) 06/2021  . DDD (degenerative disc disease), lumbar 08/19/2014   and facet arthroplasty & left lumbar radiculopathy (Dr.Ramos)  . Depression   . Dyssynergia    dyssynergenic defecation, contributing to fecal incontinence.  . Edema   . Fibromyalgia   . Gastroparesis    follow[facility] at [facility]  . GERD (gastroesophageal reflux disease)   . History of kidney stones   . History of vertebral fracture 06/30/2021  . Hyperlipidemia   . Kidney stone   . Lumbar radiculopathy   . Migraine   . Neuropathy   . Obstructive sleep apnea    Does  wear  CPAP  . Osteoporosis   . Paresthesia    Dr. Antonietta Barcelona at [facility]  . Pelvic floor dysfunction    pelvic floor dyssynergy  . Plantar fasciitis 02/2011   R foot  . Pneumonia    2012  . PONV (postoperative nausea and vomiting)    pt states has gastroparesis has difficulty taking antibiotics and narcotics has severe nausea and vomiting   . Restless leg syndrome   . S/P endometrial ablation 08/09/2006   Novasure Ablation  . S/P epidural steroid injection 09/20/2014  Dr.Ramos  . Tremor    Dr. Antonietta Barcelona  . Urinary frequency   . Urinary incontinence     Family History  Problem Relation Age of Onset  . Allergies Mother   . Hypertension Mother   . Heart disease Mother        possible valve problem - leaking valve  . Macular degeneration Mother   . Macular degeneration Father   . Heart disease Father        pacemaker, CHF  . Hypertension Father   . Diabetes Father        borderline  . Stroke Father 3  . Kidney disease Father   . Allergies Sister   . Asthma Sister   . Irritable bowel syndrome Sister   . Macular degeneration Sister   . Heart disease Maternal Grandmother   . Heart disease Maternal Grandfather   . Heart disease Paternal Grandmother   . Heart  disease Paternal Grandfather   . Cancer Maternal Aunt        leukemia  . Cancer Maternal Aunt   . Colon cancer Maternal Aunt        late 57's  . CAD Neg Hx    Past Surgical History:  Procedure Laterality Date  . CHOLECYSTECTOMY  07/2004  . ENDOMETRIAL ABLATION  08/09/2006   Dr. Kingsley Plan Ablation  . FACET JOINT INJECTION  04/17/2017   Left L4-5 and L5-S1  . FOOT SURGERY Right 06/29/2022   FUSION OF JOINT IN BIG TOE RT FOOT, Dr. Lilian Kapur  . GASTRIC ROUX-EN-Y N/A 09/29/2019   Procedure: LAPAROSCOPIC ROUX-EN-Y GASTRIC BYPASS WITH UPPER ENDOSCOPY, ERAS Pathway;  Surgeon: Luretha Murphy, MD;  Location: [facility];  Service: General;  Laterality: N/A;  . IRRIGATION AND DEBRIDEMENT FOOT Right 08/03/2022   Procedure: foot bone biopsy and hardware removal;  Surgeon: Edwin Cap, DPM;  Location: [facility];  Service: Podiatry;  Laterality: Right;  . KNEE ARTHROPLASTY    . KNEE SURGERY  1999   R knee, Dr. Renae Fickle, torn cartilage  . KYPHOPLASTY  11/06/2021   [id number]-[id number]  . RETINAL LASER PROCEDURE Right 08/28/2018   laser retinopexy  . RIGHT/LEFT HEART CATH AND CORONARY ANGIOGRAPHY N/A 01/01/2018   Procedure: RIGHT/LEFT HEART CATH AND CORONARY ANGIOGRAPHY;  Surgeon: Tonny Bollman, MD;  Location: [facility] INVASIVE CV LAB;  Service: Cardiovascular;  Laterality: N/A;  . TONSILLECTOMY  1968  . TONSILLECTOMY    . TOTAL KNEE ARTHROPLASTY Right 09/05/2015   Procedure: RIGHT TOTAL KNEE ARTHROPLASTY;  Surgeon: Ollen Gross, MD;  Location: [facility];  Service: Orthopedics;  Laterality: Right;  . TOTAL KNEE ARTHROPLASTY Left 07/14/2018   Procedure: LEFT TOTAL KNEE ARTHROPLASTY;  Surgeon: Ollen Gross, MD;  Location: [facility];  Service: Orthopedics;  Laterality: Left;  . ULTRASOUND GUIDANCE FOR VASCULAR ACCESS  01/01/2018   Procedure: Ultrasound Guidance For Vascular Access;  Surgeon: Tonny Bollman, MD;  Location: [facility] INVASIVE CV LAB;  Service: Cardiovascular;;  . UMBILICAL HERNIA REPAIR N/A 09/29/2019    Procedure: HERNIA REPAIR UMBILICAL ADULT;  Surgeon: Luretha Murphy, MD;  Location: [facility];  Service: General;  Laterality: N/A;   Social History   Social History Narrative   Married, 1 son in [place] (grandson born 06/2017), 1 stepson in [place], with 2 children      Updated 07/2023   Immunization History  Administered Date(s) Administered  . Fluad Quad(high Dose 65+) 09/06/2022  . Fluad Trivalent(High Dose 65+) 08/28/2023  . Influenza Inj Mdck Quad Pf 09/13/2019  . Influenza Split 09/17/2011, 09/03/2012, 09/04/2013  .  Influenza,inj,Quad PF,6+ Mos 08/16/2014, 07/27/2015, 07/31/2017, 08/13/2018, 07/28/2020  . Influenza-Unspecified 10/04/2016, 07/31/2017, 09/23/2017, 09/18/2021  . PFIZER Comirnaty(Gray Top)Covid-19 Tri-Sucrose Vaccine 06/01/2021  . PFIZER(Purple Top)SARS-COV-2 Vaccination 02/20/2020, 03/12/2020, 09/20/2020  . PNEUMOCOCCAL CONJUGATE-20 07/26/2022  . [employer], [employer] 12yrs & up 02/05/2022  . Pfizer(Comirnaty)Fall Seasonal Vaccine 12 years and older 01/31/2023  . Tdap 01/16/2011, 06/20/2017  . Zoster Recombinant(Shingrix) 02/05/2019, 07/14/2019     Objective: Vital Signs: BP 132/87 (BP Location: Left Arm, Patient Position: Sitting, Cuff Size: Normal)   Pulse 62   Resp 14   Ht 5' 1.25" (1.556 m)   Wt 193 lb 9.6 oz (87.8 kg)   LMP 07/27/2006   BMI 36.28 kg/m    Physical Exam Vitals and nursing note reviewed.  Constitutional:      Appearance: She is well-developed.  HENT:     Head: Normocephalic and atraumatic.  Eyes:     Conjunctiva/sclera: Conjunctivae normal.  Cardiovascular:     Rate and Rhythm: Normal rate and regular rhythm.     Heart sounds: Normal heart sounds.  Pulmonary:     Effort: Pulmonary effort is normal.     Breath sounds: Normal breath sounds.  Abdominal:     General: Bowel sounds are normal.     Palpations: Abdomen is soft.  Musculoskeletal:     Cervical back: Normal range of motion.  Lymphadenopathy:      Cervical: No cervical adenopathy.  Skin:    General: Skin is warm and dry.     Capillary Refill: Capillary refill takes less than 2 seconds.  Neurological:     Mental Status: She is alert and oriented to person, place, and time.  Psychiatric:        Behavior: Behavior normal.     Musculoskeletal Exam: C-spine has limited ROM with lateral rotation.  Right trapezius muscle tension and tenderness bilaterally. Shoulder joints, elbow joints, wrist joints, MCPs, PIPs, and DIPs good ROM with no synovitis.  Complete fist formation bilaterally.  PIP and DIP thickening consistent with osteoarthritis of both hands. Hip joints have good ROM with no groin pain.  Knee joints have good ROM with no warmth or effusion.  Ankle joints have good ROM with no tenderness or swelling. No tenderness or synovitis of MTP joints.   CDAI Exam: CDAI Score: -- Patient Global: --; Provider Global: -- Swollen: --; Tender: -- Joint Exam 10/21/2023   No joint exam has been documented for this visit   There is currently no information documented on the homunculus. Go to the Rheumatology activity and complete the homunculus joint exam.  Investigation: No additional findings.  Imaging: No results found.  Recent Labs: Lab Results  Component Value Date   WBC 6.9 06/17/2023   HGB 12.4 06/17/2023   PLT 316 06/17/2023   NA 140 08/28/2023   K 4.8 08/28/2023   CL 101 08/28/2023   CO2 26 08/28/2023   GLUCOSE 93 08/28/2023   BUN 16 08/28/2023   CREATININE 0.70 08/28/2023   BILITOT <0.2 08/28/2023   ALKPHOS 137 (H) 08/28/2023   AST 23 08/28/2023   ALT 19 08/28/2023   PROT 6.5 08/28/2023   ALBUMIN 4.0 08/28/2023   CALCIUM 9.2 08/28/2023   GFRAA 81 11/07/2020    Speciality Comments: Forteo started 08/07/21 (first dose in clinic)  Procedures:  Trigger Point Inj  Date/Time: 10/21/2023 10:01 AM  Performed by: Gearldine Bienenstock, PA-C Authorized by: Gearldine Bienenstock, PA-C   Consent Given by:  Patient Site marked:  the procedure  site was marked   Timeout: prior to procedure the correct patient, procedure, and site was verified   Indications:  Pain Total # of Trigger Points:  1 (Right Trapezius trigger point injection) Location: neck   Needle Size:  27 G Approach:  Dorsal Medications #1:  0.5 mL lidocaine 1 %; 10 mg triamcinolone acetonide 40 MG/ML Patient tolerance:  Patient tolerated the procedure well with no immediate complications  Allergies: Erythromycin, Meperidine hcl, Polyethyl glyc-propyl glyc pf, Polyethylene glycol 3350, Zolpidem, and Dilaudid [hydromorphone hcl]      Assessment / Plan:     Visit Diagnoses: Age-related osteoporosis without current pathological fracture - Previous DEXA 07/12/2020: Right femoral neck: BMD 0.632 with T score -2.0.  H/o vertebral compression fractures. History of gastric bypass.  Completed 2 years of Forteo-completed early fall 2024.   DEXA updated on 07/02/23: The BMD measured at Left Forearm Radius 33% is 0.713 g/cm2 with a T-score of -1.9 Patient was switched to IV Reclast on 09/09/2023.  She experienced some increased arthralgias and myalgias 24 to 36 hours after the infusion but has otherwise not had any lasting side effects.  She continues to take calcium 500 mg 3 times daily.  Vitamin D was within normal limits: 71 on 06/17/2023. She will follow up in 6 months or sooner if needed.   History of vertebral compression fracture: Completed 2 years of Forteo.    Medication monitoring encounter: Patient initiated IV Reclast on 09/09/2023.  She experienced increased arthralgias and myalgias 24 to 36 hours after the infusion which have resolved.  Calcium was low at 8.2 on 09/25/2023.  She is taking calcium 500 mg 3 times daily.  Vitamin D deficiency: Vitamin D was 71 on 06/17/2023.  Idiopathic chronic gout of multiple sites without tophus -She has not had any signs or symptoms of a gout flare.  She has clinically been doing well taking allopurinol 300 mg 1 tablet by  mouth daily prescribed by her PCP.  She takes colchicine 0.6 mg 1 tablet by mouth daily as needed during flares.  Uric acid was within desirable range: 5.1 on 06/17/2023.  Elevated LFTs: AST and ALT were within normal limits on 09/25/2023.  Undergoing workup by GI.   Primary osteoarthritis of both hands: She has CMC, PIP, DIP thickening consistent with osteoarthritis of both hands.  No synovitis noted on examination today.  Discussed the importance of joint protection and muscle strengthening.  Primary osteoarthritis of both hips: Patient has tenderness palpation over bilateral trochanteric bursa.  Encouraged the patient to perform stretching exercises daily.  History of total knee replacement, bilateral - 2016, 2019 by Dr. Despina Hick.  Doing well.  Primary osteoarthritis of both feet: Chronic pain right great toe.  No synovitis noted today.   Achilles tendinitis, right leg: Not currently symptomatic.   DDD (degenerative disc disease), cervical: C-spine has limited ROM with lateral rotation.   Degeneration of intervertebral disc of lumbar region without discogenic back pain or lower extremity pain - Dr. Shelle Iron: Chronic pain.  Fibromyalgia: She has generalized hyperalgesia and positive tender points on exam.  Patient has been under tremendous amount of stress caring for both her mother and her husband off and on since August 2024.  She has difficulty sleeping at night due to her stress levels as well as nocturnal pain.  She continues to experience fatigue on a daily basis.  She continues to take armodafinil as prescribed.  She also remains on Cymbalta as prescribed.  Patient presents today with right trapezius  muscle tension and tenderness.  She took methocarbamol this morning but her symptoms have not improved.  She has requested a right trapezius trigger point injection today.  She tolerated the procedure well.  Procedure note was completed above. Discussed the importance of regular exercise and good  sleep hygiene.  Other fatigue: Chronic, secondary to insomnia.  Discussed the importance of regular exercise and good sleep hygiene.  She continues to take armodafinil as prescribed.   Trapezius muscle spasm - Patient presents today with trapezius muscle tension and tenderness bilaterally, right greater than left.  She tried taking methocarbamol this morning but her symptoms have persisted.  She is having significant discomfort on the right side of her neck.  Right trapezius muscle trigger point injection performed today in the office as requested.  She tolerated the procedure well.  Aftercare was dicussed.  Procedure note completed above. Plan: Trigger Point Inj  Other medical conditions are listed as follows:   History of osteomyelitis - Right great toe-History of osteomyelitis.  Completed IV antibiotics on 09/14/22. Under care of Dr Elinor Parkinson.  Gastroesophageal reflux disease without esophagitis  History of depression  History of migraine  History of sleep apnea  Orders: Orders Placed This Encounter  Procedures  . Trigger Point Inj   No orders of the defined types were placed in this encounter.     Follow-Up Instructions: Return in about 6 months (around 04/19/2024) for Osteoporosis, Osteoarthritis, Gout.   Gearldine Bienenstock, PA-C  Note - This record has been created using Dragon software.  Chart creation errors have been sought, but may not always  have been located. Such creation errors do not reflect on  the standard of medical care.

## 2023-10-08 ENCOUNTER — Other Ambulatory Visit: Payer: Self-pay

## 2023-10-08 ENCOUNTER — Other Ambulatory Visit: Payer: Self-pay | Admitting: Physician Assistant

## 2023-10-08 ENCOUNTER — Other Ambulatory Visit (HOSPITAL_COMMUNITY): Payer: Self-pay

## 2023-10-08 MED ORDER — ARMODAFINIL 250 MG PO TABS
250.0000 mg | ORAL_TABLET | Freq: Every day | ORAL | 0 refills | Status: DC | PRN
Start: 2023-10-08 — End: 2023-11-07
  Filled 2023-10-08: qty 30, 30d supply, fill #0

## 2023-10-08 NOTE — Telephone Encounter (Signed)
Last Fill: 08/29/2023   Next Visit: 09/17/2023   Last Visit: 722/2024   Dx: Other fatigue-related to fibromyalgia    Current Dose per office note on 06/17/2023: Nuvigil to 50 mg p.o. every morning in January 2024 due to ongoing severe fatigue. The dose in the office note does not match the dose we have always prescribed for patient.    Okay to refill Armodafinil?

## 2023-10-09 ENCOUNTER — Other Ambulatory Visit (HOSPITAL_COMMUNITY): Payer: Self-pay

## 2023-10-14 ENCOUNTER — Encounter: Payer: Self-pay | Admitting: Family Medicine

## 2023-10-14 ENCOUNTER — Ambulatory Visit: Payer: Medicare Other | Admitting: Family Medicine

## 2023-10-14 VITALS — BP 120/80 | HR 64 | Temp 98.4°F | Ht 62.0 in | Wt 193.2 lb

## 2023-10-14 DIAGNOSIS — J302 Other seasonal allergic rhinitis: Secondary | ICD-10-CM

## 2023-10-14 DIAGNOSIS — K760 Fatty (change of) liver, not elsewhere classified: Secondary | ICD-10-CM | POA: Diagnosis not present

## 2023-10-14 DIAGNOSIS — R0989 Other specified symptoms and signs involving the circulatory and respiratory systems: Secondary | ICD-10-CM | POA: Diagnosis not present

## 2023-10-14 NOTE — Progress Notes (Signed)
Chief Complaint  Patient presents with   Nasal Congestion    Started last Tuesday with ST, nasal congestion and right ear pain. Has slight cough, no fever, chills. Has some body aches. Neg covid test yesterday.    11/12 she started with scratchy throat, watery eyes, nasal congestion. She is now coughing. She has "felt so bad"--decrease in energy, feeling "yucky". Yesterday was her worst day--felt achey, R ear pain, throat hurt worse. She feels a little better today. No ear pain today, less achey.  Nasal drainage is clear. Feels like she has phlegm, but hasn't been able to expectorate any. Denies shortness of breath (just slight with activity) or wheezing.  No sick contacts. Hasn't been outside a lot or had other allergen exposures. +known seasonal allergies, in both spring and fall  Usually takes alka selzer cold tablets prn for cold/allergies, which usually helps. She started with this, but then switched to Mucinex (plain, 12 hour). She added Astepro 2 days ago. She thought it helped some, but felt worse yesterday overall. She continues to take zyrtec and flonase daily. Sometimes can feel the flonase down her throat.  She mentions recent visit with liver doctor, and need for hepatitis A and B vaccines. Chart reviewed, notes, labs and imaging reviewed.  Non-immune to HepB.   PMH, PSH, SH reviewed  Outpatient Encounter Medications as of 10/14/2023  Medication Sig Note   acetaminophen (TYLENOL) 500 MG tablet Take 1,000 mg by mouth every 6 (six) hours as needed. 10/14/2023: Took 1000mg  yesterday   allopurinol (ZYLOPRIM) 300 MG tablet TAKE 1 TABLET BY MOUTH EVERY DAY    ALPRAZolam (XANAX) 0.25 MG tablet Take 1 tablet (0.25 mg total) by mouth 3 (three) times daily as needed for anxiety. 10/14/2023: Took one last night   ARIPiprazole (ABILIFY) 5 MG tablet TAKE 1 TABLET (5 MG TOTAL) BY MOUTH DAILY.    Armodafinil 250 MG tablet Take 1 tablet (250 mg) by mouth daily as needed.    aspirin  EC 81 MG tablet Take 81 mg by mouth in the morning. Swallow whole.    atorvastatin (LIPITOR) 40 MG tablet Take 1 tablet (40 mg total) by mouth daily.    azelastine (ASTELIN) 0.1 % nasal spray Place 1 spray into both nostrils 2 (two) times daily. Use in each nostril as directed 10/14/2023: OTC Astepro--taking x 2 days, BID   CALCIUM PO Take 500 mg by mouth in the morning, at noon, and at bedtime.    cetirizine (ZYRTEC) 10 MG tablet Take 10 mg by mouth at bedtime.    DULoxetine (CYMBALTA) 60 MG capsule TAKE 1 CAPSULE BY MOUTH 2 TIMES DAILY.    fluticasone (FLONASE) 50 MCG/ACT nasal spray Place 2 sprays into both nostrils daily.    gabapentin (NEURONTIN) 300 MG capsule Take 300 mg by mouth 4 (four) times daily.    guaiFENesin (MUCINEX) 600 MG 12 hr tablet Take 600 mg by mouth 2 (two) times daily. 10/14/2023: Took one today   methocarbamol (ROBAXIN) 500 MG tablet TAKE 1 TABLET BY MOUTH EVERY DAY AS NEEDED 10/14/2023: Took one last night   metoprolol tartrate (LOPRESSOR) 50 MG tablet TAKE 1 TABLET BY MOUTH TWICE A DAY    Multiple Vitamins-Minerals (PRESERVISION AREDS 2) CAPS Take 1 capsule by mouth in the morning and at bedtime.    ondansetron (ZOFRAN) 8 MG tablet Take 8 mg by mouth every 8 (eight) hours as needed for nausea. 10/14/2023: Took one last night   OXcarbazepine (TRILEPTAL) 150 MG tablet Take 150  mg by mouth in the morning and at bedtime.    pantoprazole (PROTONIX) 40 MG tablet Take 1 tablet (40 mg total) by mouth daily. (Patient taking differently: Take 40 mg by mouth at bedtime.) 10/14/2023: Daily at bedtime   PRESCRIPTION MEDICATION CPAP- At bedtime    Probiotic Product (PROBIOTIC ADVANCED PO) Take 1 capsule by mouth at bedtime.    promethazine (PHENERGAN) 25 MG tablet Take 25 mg by mouth every 6 (six) hours as needed for nausea or vomiting. 10/14/2023: Took one yesterday   RESTASIS 0.05 % ophthalmic emulsion Place 1 drop into both eyes in the morning and at bedtime.    SYSTANE ULTRA PF  0.4-0.3 % SOLN Place 1 drop into both eyes 3 (three) times daily as needed (for dryness).    traZODone (DESYREL) 50 MG tablet TAKE 1 TABLET BY MOUTH EVERYDAY AT BEDTIME    Zoledronic Acid (RECLAST IV) Inject 5 mg into the vein. Every 12 months    albuterol (VENTOLIN HFA) 108 (90 Base) MCG/ACT inhaler TAKE 2 PUFFS BY MOUTH EVERY 6 HOURS AS NEEDED FOR WHEEZE OR SHORTNESS OF BREATH (Patient not taking: Reported on 07/03/2023) 10/14/2023: As needed   colchicine 0.6 MG tablet Take 1 tablet (0.6 mg total) by mouth as needed. (Patient not taking: Reported on 07/03/2023) 10/14/2023: As needed   meclizine (ANTIVERT) 25 MG tablet Take 1 tablet (25 mg total) by mouth 3 (three) times daily as needed for dizziness. (Patient not taking: Reported on 07/03/2023) 10/14/2023: As needed   traMADol (ULTRAM) 50 MG tablet Take 50 mg by mouth 3 (three) times daily as needed. (Patient not taking: Reported on 06/17/2023) 10/14/2023: As needed   [DISCONTINUED] clindamycin (CLEOCIN) 150 MG capsule Take 150 mg by mouth 3 (three) times daily.    No facility-administered encounter medications on file as of 10/14/2023.   Allergies  Allergen Reactions   Erythromycin Nausea Only and Other (See Comments)    Abdominal pain, also   Meperidine Hcl Other (See Comments)    Made the patient feel hot and her heart raced- made her feel "flushed," also   Polyethyl Glyc-Propyl Glyc Pf Nausea And Vomiting and Other (See Comments)    RN confirmed with patient 9/4 (Miralax)   Polyethylene Glycol 3350 Other (See Comments)    Other Reaction(s): GI Intolerance  RN confirmed with patient 9/4 (Miralax)   Zolpidem Other (See Comments)    Lost balance   Dilaudid [Hydromorphone Hcl] Itching     ROS: No known fever, chills. No v/d, some nausea (chronic) Some headaches (R temple, not sinus). No dizziness. No bleeding, bruising, rashes. See HPI.    PHYSICAL EXAM:  BP 120/80   Pulse 64   Temp 98.4 F (36.9 C) (Tympanic)   Ht 5\' 2"  (1.575  m)   Wt 193 lb 3.2 oz (87.6 kg)   LMP 07/27/2006   BMI 35.34 kg/m   Pleasant, well-appearing female in no distress No sniffling, throat-clearing or coughing during visit HEENT: conjunctiva and sclera are clear, EOMI. TM's and EAC's normal bilaterally. Nasal mucosa--mod edema, clear mucus, some slight yellow crusting OP clear, no erythema or lesions, no purulent drainage. Sinuses nontender Neck: No lymphadenopathy or mass Heart: regular rate and rhythm Lungs: clear bilaterally Psych: normal mood, affect, hygiene and grooming Neuro: alert and oriented, cranial nerves grossly intact, normal gait    ASSESSMENT/PLAN:   Seasonal allergic rhinitis, unspecified trigger - cont nasal steroid an antihistamines--can switch up to see if others more effective. Can cont Astepro. Mucinex prn.  no e/o bacterial infection  Upper respiratory symptom - suspect related to allergies. Given worseing sx yesterday, cannot r/o URI/viral illness. Cont supportive measures. Bacterial sx reviewed  Hepatic steatosis - under care of GI at [facility] (Dr. Fransisco Beau).  Hep A&B vaccines recommended. Will return for NV when feeling better  Chart reviewed--HepB non-immune Rec Heplisav-B and Hep A vaccines. Discussed timing--will return for NV's.   I spent 36 minutes dedicated to the care of this patient, including pre-visit review of records, face to face time (29 mins), post-visit ordering of testing and documentation.  Stay well hydrated. Continue the zyrtec daily (vs switching to allegra or claritin). Continue nasal steroids--consider switching from Flonase to a different one, such as nasacort or nasonex. Be sure to only use gentle sniffs. Continue the astepro twice daily as needed. Continue mucinex twice daily. Continue nasal saline spray, and consider sinus rinses as needed.  If allergy symptoms persist or worsen (with no fever or discolored mucus), we can consider a course of steroids).  I see no evidence to  suggest a bacterial infection. This could be a flare of seasonal allergies, versus a mild viral infection.  Repeat COVID test if you spike a fever, or are getting worse.  Schedule nurse visits for your vaccines when feeling better-- Hepatitis A vaccine is 2 doses, 6 months apart. Heplisav-B is 2 doses, a month apart.

## 2023-10-14 NOTE — Patient Instructions (Addendum)
  Stay well hydrated. Continue the zyrtec daily (vs switching to allegra or claritin). Continue nasal steroids--consider switching from Flonase to a different one, such as nasacort or nasonex. Be sure to only use gentle sniffs. Continue the astepro twice daily as needed. Continue mucinex twice daily. Continue nasal saline spray, and consider sinus rinses as needed.  If allergy symptoms persist or worsen (with no fever or discolored mucus), we can consider a course of steroids).  I see no evidence to suggest a bacterial infection. This could be a flare of seasonal allergies, versus a mild viral infection.  Repeat COVID test if you spike a fever, or are getting worse.  Schedule nurse visits for your vaccines when feeling better-- Hepatitis A vaccine is 2 doses, 6 months apart. Heplisav-B is 2 doses, a month apart.

## 2023-10-21 ENCOUNTER — Encounter: Payer: Self-pay | Admitting: Physician Assistant

## 2023-10-21 ENCOUNTER — Ambulatory Visit: Payer: Medicare Other | Attending: Physician Assistant | Admitting: Physician Assistant

## 2023-10-21 VITALS — BP 132/87 | HR 62 | Resp 14 | Ht 61.25 in | Wt 193.6 lb

## 2023-10-21 DIAGNOSIS — M160 Bilateral primary osteoarthritis of hip: Secondary | ICD-10-CM

## 2023-10-21 DIAGNOSIS — M5030 Other cervical disc degeneration, unspecified cervical region: Secondary | ICD-10-CM | POA: Diagnosis not present

## 2023-10-21 DIAGNOSIS — Z8739 Personal history of other diseases of the musculoskeletal system and connective tissue: Secondary | ICD-10-CM

## 2023-10-21 DIAGNOSIS — Z5181 Encounter for therapeutic drug level monitoring: Secondary | ICD-10-CM

## 2023-10-21 DIAGNOSIS — Z8659 Personal history of other mental and behavioral disorders: Secondary | ICD-10-CM

## 2023-10-21 DIAGNOSIS — E559 Vitamin D deficiency, unspecified: Secondary | ICD-10-CM | POA: Diagnosis not present

## 2023-10-21 DIAGNOSIS — M810 Age-related osteoporosis without current pathological fracture: Secondary | ICD-10-CM

## 2023-10-21 DIAGNOSIS — R7989 Other specified abnormal findings of blood chemistry: Secondary | ICD-10-CM

## 2023-10-21 DIAGNOSIS — R5383 Other fatigue: Secondary | ICD-10-CM

## 2023-10-21 DIAGNOSIS — M797 Fibromyalgia: Secondary | ICD-10-CM

## 2023-10-21 DIAGNOSIS — M7661 Achilles tendinitis, right leg: Secondary | ICD-10-CM | POA: Diagnosis not present

## 2023-10-21 DIAGNOSIS — M25511 Pain in right shoulder: Secondary | ICD-10-CM | POA: Diagnosis not present

## 2023-10-21 DIAGNOSIS — M62838 Other muscle spasm: Secondary | ICD-10-CM

## 2023-10-21 DIAGNOSIS — M1A09X0 Idiopathic chronic gout, multiple sites, without tophus (tophi): Secondary | ICD-10-CM | POA: Diagnosis not present

## 2023-10-21 DIAGNOSIS — M19041 Primary osteoarthritis, right hand: Secondary | ICD-10-CM | POA: Diagnosis not present

## 2023-10-21 DIAGNOSIS — M19071 Primary osteoarthritis, right ankle and foot: Secondary | ICD-10-CM

## 2023-10-21 DIAGNOSIS — M19042 Primary osteoarthritis, left hand: Secondary | ICD-10-CM

## 2023-10-21 DIAGNOSIS — K219 Gastro-esophageal reflux disease without esophagitis: Secondary | ICD-10-CM

## 2023-10-21 DIAGNOSIS — Z8669 Personal history of other diseases of the nervous system and sense organs: Secondary | ICD-10-CM

## 2023-10-21 DIAGNOSIS — Z8781 Personal history of (healed) traumatic fracture: Secondary | ICD-10-CM | POA: Diagnosis not present

## 2023-10-21 DIAGNOSIS — M19072 Primary osteoarthritis, left ankle and foot: Secondary | ICD-10-CM

## 2023-10-21 DIAGNOSIS — M51369 Other intervertebral disc degeneration, lumbar region without mention of lumbar back pain or lower extremity pain: Secondary | ICD-10-CM

## 2023-10-21 DIAGNOSIS — Z96653 Presence of artificial knee joint, bilateral: Secondary | ICD-10-CM | POA: Diagnosis not present

## 2023-10-21 MED ORDER — LIDOCAINE HCL 1 % IJ SOLN
0.5000 mL | INTRAMUSCULAR | Status: AC | PRN
Start: 2023-10-21 — End: 2023-10-21
  Administered 2023-10-21: .5 mL

## 2023-10-21 MED ORDER — TRIAMCINOLONE ACETONIDE 40 MG/ML IJ SUSP
10.0000 mg | INTRAMUSCULAR | Status: AC | PRN
Start: 2023-10-21 — End: 2023-10-21
  Administered 2023-10-21: 10 mg via INTRAMUSCULAR

## 2023-11-07 ENCOUNTER — Other Ambulatory Visit (HOSPITAL_COMMUNITY): Payer: Self-pay

## 2023-11-07 ENCOUNTER — Other Ambulatory Visit: Payer: Self-pay | Admitting: Rheumatology

## 2023-11-07 MED ORDER — ARMODAFINIL 250 MG PO TABS
250.0000 mg | ORAL_TABLET | Freq: Every day | ORAL | 0 refills | Status: DC | PRN
  Filled 2023-11-07: qty 30, 30d supply, fill #0

## 2023-11-07 NOTE — Telephone Encounter (Signed)
Last Fill: 10/08/2023  Next Visit: 04/21/2024  Last Visit: 10/21/2023  Dx: Other fatigue   Current Dose per office note on 10/21/2023: continues to take armodafinil as prescribed.   Okay to refill Armodafinil?

## 2023-11-14 ENCOUNTER — Ambulatory Visit: Payer: Medicare Other | Admitting: Adult Health

## 2023-11-14 ENCOUNTER — Encounter: Payer: Self-pay | Admitting: Adult Health

## 2023-11-14 DIAGNOSIS — F411 Generalized anxiety disorder: Secondary | ICD-10-CM

## 2023-11-14 DIAGNOSIS — G4700 Insomnia, unspecified: Secondary | ICD-10-CM | POA: Diagnosis not present

## 2023-11-14 DIAGNOSIS — F331 Major depressive disorder, recurrent, moderate: Secondary | ICD-10-CM

## 2023-11-14 MED ORDER — TRAZODONE HCL 50 MG PO TABS
50.0000 mg | ORAL_TABLET | Freq: Every day | ORAL | 1 refills | Status: DC
Start: 2023-11-14 — End: 2024-05-14

## 2023-11-14 MED ORDER — ARIPIPRAZOLE 5 MG PO TABS: 5.0000 mg | ORAL_TABLET | Freq: Every day | ORAL | 1 refills | Status: DC

## 2023-11-14 MED ORDER — ALPRAZOLAM 0.25 MG PO TABS
0.2500 mg | ORAL_TABLET | Freq: Three times a day (TID) | ORAL | 2 refills | Status: DC | PRN
Start: 2023-11-14 — End: 2024-02-12

## 2023-11-14 NOTE — Progress Notes (Signed)
[name] [id number] 1956-04-01 67 y.o.  Subjective:   Patient ID:  [name] is a 67 y.o. (DOB [birth year]) female.  Chief Complaint: No chief complaint on file.   HPI [name] presents to the office today for follow-up of anxiety, depression, and insomnia.  Describes mood today as "it could be better". Pleasant. Tearful at times. Mood symptoms - reports some situational depression, anxiety and irritability. Reports some worry, rumination and over thinking. Reports increased situational stressors with mother's health issues. Mood is consistent. Stating "I feel like I'm doing alright". Feels like medications are helpful. Stable interest and motivation. Taking medications as prescribed.  Energy levels lower. Active, does not have a regular exercise. Enjoys some usual interests and activities. Married. Lives with husband. Has 2 sons - one local and another one in [place]. Mother local - 24. Spending time with family. Appetite adequate.  Sleeps better some nights than others. Using CPAP machine. Focus and concentration stable. Completing tasks. Managing aspects of household. Retired. Denies SI or HI.  Denies AH or VH Denies self harm. Denies substance use.  Bariatric surgery 11/20.    PHQ2-9    Flowsheet Row Office Visit from 08/28/2023 in [facility] Most recent reading at 08/28/2023  3:10 PM Office Visit from 11/05/2022 in [facility] - A Dept Of [place] Most recent reading at 11/05/2022  9:32 AM Office Visit from 09/25/2022 in [facility] - A Dept Of [place]. [facility] Most recent reading at 09/25/2022  9:38 AM Office Visit from 07/26/2022 in [facility] Most recent reading at 07/26/2022  2:41 PM Video Visit from 07/26/2022 in [facility] - A Dept Of [place] Most recent reading at 07/26/2022  9:13 AM  PHQ-2 Total Score 0 1 0 2 1   PHQ-9 Total Score 4 -- -- 9 --      Flowsheet Row IV Medication 120 from 09/09/2023 in [facility] ED to Hosp-Admission (Discharged) from 07/30/2022 in [place] [facility] ED from 12/31/2021 in [facility] Department at [facility]  C-SSRS RISK CATEGORY No Risk No Risk No Risk        Review of Systems:  Review of Systems  Musculoskeletal:  Negative for gait problem.  Neurological:  Negative for tremors.  Psychiatric/Behavioral:         Please refer to HPI    Medications: I have reviewed the patient's current medications.  Current Outpatient Medications  Medication Sig Dispense Refill   acetaminophen (TYLENOL) 500 MG tablet Take 1,000 mg by mouth every 6 (six) hours as needed.     albuterol (VENTOLIN HFA) 108 (90 Base) MCG/ACT inhaler TAKE 2 PUFFS BY MOUTH EVERY 6 HOURS AS NEEDED FOR WHEEZE OR SHORTNESS OF BREATH 8.5 each 0   allopurinol (ZYLOPRIM) 300 MG tablet TAKE 1 TABLET BY MOUTH EVERY DAY 90 tablet 1   ALPRAZolam (XANAX) 0.25 MG tablet Take 1 tablet (0.25 mg total) by mouth 3 (three) times daily as needed for anxiety. 90 tablet 2   ARIPiprazole (ABILIFY) 5 MG tablet TAKE 1 TABLET (5 MG TOTAL) BY MOUTH DAILY. 90 tablet 0   Armodafinil 250 MG tablet Take 1 tablet (250 mg) by mouth daily as needed. 30 tablet 0   aspirin EC 81 MG tablet Take 81 mg by mouth in the morning. Swallow whole.  atorvastatin (LIPITOR) 40 MG tablet Take 1 tablet (40 mg total) by mouth daily. 90 tablet 3   azelastine (ASTELIN) 0.1 % nasal spray Place 1 spray into both nostrils 2 (two) times daily. Use in each nostril as directed     CALCIUM PO Take 500 mg by mouth in the morning, at noon, and at bedtime.     cetirizine (ZYRTEC) 10 MG tablet Take 10 mg by mouth at bedtime.     colchicine 0.6 MG tablet Take 1 tablet (0.6 mg total) by mouth as needed. 30 tablet 0   DULoxetine (CYMBALTA) 60 MG capsule TAKE 1 CAPSULE BY MOUTH 2 TIMES DAILY. 180 capsule  3   fluticasone (FLONASE) 50 MCG/ACT nasal spray Place 2 sprays into both nostrils daily. 16 g 11   gabapentin (NEURONTIN) 300 MG capsule Take 300 mg by mouth 4 (four) times daily.     guaiFENesin (MUCINEX) 600 MG 12 hr tablet Take 600 mg by mouth 2 (two) times daily. (Patient not taking: Reported on 10/21/2023)     meclizine (ANTIVERT) 25 MG tablet Take 1 tablet (25 mg total) by mouth 3 (three) times daily as needed for dizziness. 30 tablet 0   methocarbamol (ROBAXIN) 500 MG tablet TAKE 1 TABLET BY MOUTH EVERY DAY AS NEEDED 30 tablet 2   metoprolol tartrate (LOPRESSOR) 50 MG tablet TAKE 1 TABLET BY MOUTH TWICE A DAY 180 tablet 1   Multiple Vitamins-Minerals (PRESERVISION AREDS 2) CAPS Take 1 capsule by mouth in the morning and at bedtime.     ondansetron (ZOFRAN) 8 MG tablet Take 8 mg by mouth every 8 (eight) hours as needed for nausea.  0   OXcarbazepine (TRILEPTAL) 150 MG tablet Take 150 mg by mouth in the morning and at bedtime.     pantoprazole (PROTONIX) 40 MG tablet Take 1 tablet (40 mg total) by mouth daily. (Patient taking differently: Take 40 mg by mouth at bedtime.) 90 tablet 0   PRESCRIPTION MEDICATION CPAP- At bedtime     Probiotic Product (PROBIOTIC ADVANCED PO) Take 1 capsule by mouth at bedtime.     promethazine (PHENERGAN) 25 MG tablet Take 25 mg by mouth every 6 (six) hours as needed for nausea or vomiting.     RESTASIS 0.05 % ophthalmic emulsion Place 1 drop into both eyes in the morning and at bedtime.     SYSTANE ULTRA PF 0.4-0.3 % SOLN Place 1 drop into both eyes 3 (three) times daily as needed (for dryness).     traMADol (ULTRAM) 50 MG tablet Take 50 mg by mouth 3 (three) times daily as needed.     traZODone (DESYREL) 50 MG tablet TAKE 1 TABLET BY MOUTH EVERYDAY AT BEDTIME 90 tablet 0   Zoledronic Acid (RECLAST IV) Infusion: 09/09/2023     No current facility-administered medications for this visit.    Medication Side Effects: None  Allergies:  Allergies  Allergen  Reactions   Erythromycin Nausea Only and Other (See Comments)    Abdominal pain, also   Meperidine Hcl Other (See Comments)    Made the patient feel hot and her heart raced- made her feel "flushed," also   Polyethyl Glyc-Propyl Glyc Pf Nausea And Vomiting and Other (See Comments)    RN confirmed with patient 9/4 (Miralax)   Polyethylene Glycol 3350 Other (See Comments)    Other Reaction(s): GI Intolerance  RN confirmed with patient 9/4 (Miralax)   Zolpidem Other (See Comments)    Lost balance   Dilaudid [Hydromorphone Hcl] Itching  Past Medical History:  Diagnosis Date   Anemia    previously followed by Dr. Dalene Carrow for anemia and elevated platelets   Anxiety    C. difficile colitis 10/01/2012   treated by [facility]   Chronic fatigue syndrome    Closed wedge compression fracture of T8 vertebra (HCC) 06/2021   DDD (degenerative disc disease), lumbar 08/19/2014   and facet arthroplasty & left lumbar radiculopathy (Dr.Ramos)   Depression    Dyssynergia    dyssynergenic defecation, contributing to fecal incontinence.   Edema    Fibromyalgia    Gastroparesis    followed at [facility]   GERD (gastroesophageal reflux disease)    History of kidney stones    History of vertebral fracture 06/30/2021   Hyperlipidemia    Kidney stone    Lumbar radiculopathy    Migraine    Neuropathy    Obstructive sleep apnea    Does  wear  CPAP   Osteoporosis    Paresthesia    Dr. Antonietta Barcelona at [facility]   Pelvic floor dysfunction    pelvic floor dyssynergy   Plantar fasciitis 02/2011   R foot   Pneumonia    2012   PONV (postoperative nausea and vomiting)    pt states has gastroparesis has difficulty taking antibiotics and narcotics has severe nausea and vomiting    Restless leg syndrome    S/P endometrial ablation 08/09/2006   Novasure Ablation   S/P epidural steroid injection 09/20/2014   Dr.Ramos   Tremor    Dr. Antonietta Barcelona   Urinary frequency    Urinary incontinence     Past Medical  History, Surgical history, Social history, and Family history were reviewed and updated as appropriate.   Please see review of systems for further details on the patient's review from today.   Objective:   Physical Exam:  LMP 07/27/2006   Physical Exam Constitutional:      General: She is not in acute distress. Musculoskeletal:        General: No deformity.  Neurological:     Mental Status: She is alert and oriented to person, place, and time.     Coordination: Coordination normal.  Psychiatric:        Attention and Perception: Attention and perception normal. She does not perceive auditory or visual hallucinations.        Mood and Affect: Affect is not labile, blunt, angry or inappropriate.        Speech: Speech normal.        Behavior: Behavior normal.        Thought Content: Thought content normal. Thought content is not paranoid or delusional. Thought content does not include homicidal or suicidal ideation. Thought content does not include homicidal or suicidal plan.        Cognition and Memory: Cognition and memory normal.        Judgment: Judgment normal.     Comments: Insight intact     Lab Review:     Component Value Date/Time   NA 140 08/28/2023 1625   K 4.8 08/28/2023 1625   CL 101 08/28/2023 1625   CO2 26 08/28/2023 1625   GLUCOSE 93 08/28/2023 1625   GLUCOSE 114 (H) 06/17/2023 0955   BUN 16 08/28/2023 1625   CREATININE 0.70 08/28/2023 1625   CREATININE 0.83 06/17/2023 0955   CALCIUM 9.2 08/28/2023 1625   PROT 6.5 08/28/2023 1625   ALBUMIN 4.0 08/28/2023 1625   AST 23 08/28/2023 1625   ALT 19 08/28/2023  1625   ALKPHOS 137 (H) 08/28/2023 1625   BILITOT <0.2 08/28/2023 1625   GFRNONAA >60 08/05/2022 0326   GFRNONAA 68 07/04/2020 1129   GFRAA 81 11/07/2020 1029   GFRAA 79 07/04/2020 1129       Component Value Date/Time   WBC 6.9 06/17/2023 0955   RBC 4.26 06/17/2023 0955   HGB 12.4 06/17/2023 0955   HGB 12.5 07/17/2022 1614   HGB 11.7 05/02/2011  1412   HCT 37.5 06/17/2023 0955   HCT 38.0 07/17/2022 1614   HCT 37.1 05/02/2011 1412   PLT 316 06/17/2023 0955   PLT 431 07/17/2022 1614   MCV 88.0 06/17/2023 0955   MCV 91 07/17/2022 1614   MCV 94.4 05/02/2011 1412   MCH 29.1 06/17/2023 0955   MCHC 33.1 06/17/2023 0955   RDW 14.3 06/17/2023 0955   RDW 13.2 07/17/2022 1614   RDW 18.3 (H) 05/02/2011 1412   LYMPHSABS 1,967 06/17/2023 0955   LYMPHSABS 3.2 (H) 07/17/2022 1614   LYMPHSABS 2.4 05/02/2011 1412   MONOABS 0.9 08/02/2022 0341   MONOABS 0.8 05/02/2011 1412   EOSABS 186 06/17/2023 0955   EOSABS 0.3 07/17/2022 1614   BASOSABS 83 06/17/2023 0955   BASOSABS 0.1 07/17/2022 1614   BASOSABS 0.1 05/02/2011 1412    No results found for: "POCLITH", "LITHIUM"   No results found for: "PHENYTOIN", "PHENOBARB", "VALPROATE", "CBMZ"   .res Assessment: Plan:    Plan:  Xanax 0.25mg  TID Cymbalta 60mg  BID Abilify 5mg  daily Hydroxyzine 25mg  - take one to four tablets at hs  RTC 3 months  Patient advised to contact office with any questions, adverse effects, or acute worsening in signs and symptoms.  Discussed potential metabolic side effects associated with atypical antipsychotics, as well as potential risk for movement side effects. Advised pt to contact office if movement side effects occur.   Time spent with patient was 25 minutes. Greater than 50% of face to face time with patient was spent on counseling and coordination of care.    There are no diagnoses linked to this encounter.   Please see After Visit Summary for patient specific instructions.  Future Appointments  Date Time Provider Department Center  11/14/2023 11:20 AM Dam Ashraf, Thereasa Solo, NP [facility] None  03/11/2024  9:30 AM Joselyn Arrow, MD [facility]  04/21/2024  9:40 AM Pollyann Savoy, MD [facility] None    No orders of the defined types were placed in this encounter.   -------------------------------

## 2023-11-19 ENCOUNTER — Ambulatory Visit (INDEPENDENT_AMBULATORY_CARE_PROVIDER_SITE_OTHER): Payer: Medicare Other | Admitting: Medical

## 2023-11-19 VITALS — BP 130/80 | HR 72 | Temp 97.8°F | Ht 61.25 in | Wt 191.6 lb

## 2023-11-19 DIAGNOSIS — R350 Frequency of micturition: Secondary | ICD-10-CM

## 2023-11-19 DIAGNOSIS — R309 Painful micturition, unspecified: Secondary | ICD-10-CM | POA: Diagnosis not present

## 2023-11-19 DIAGNOSIS — R3915 Urgency of urination: Secondary | ICD-10-CM | POA: Diagnosis not present

## 2023-11-19 LAB — POCT URINALYSIS DIP (PROADVANTAGE DEVICE)
Bilirubin, UA: NEGATIVE
Blood, UA: NEGATIVE
Glucose, UA: NEGATIVE mg/dL
Ketones, POC UA: NEGATIVE mg/dL
Nitrite, UA: NEGATIVE
Specific Gravity, Urine: 1.02
Urobilinogen, Ur: 0.2
pH, UA: 6 (ref 5.0–8.0)

## 2023-11-19 MED ORDER — SULFAMETHOXAZOLE-TRIMETHOPRIM 800-160 MG PO TABS: 1.0000 | ORAL_TABLET | Freq: Two times a day (BID) | ORAL | 0 refills | Status: DC

## 2023-11-19 NOTE — Patient Instructions (Signed)
Recommendations: Drink at least 100 ounces of water throughout the day We will call with urine culture results which may take a few days to result You can either begin Bactrim antibiotic now or you could do a watch and wait approach today and tomorrow as you do not have a whole lot of symptoms currently and the urinalysis does not show an obvious infection currently If any worse in the next 24 to 48 hours such as ongoing burning with urination, continued urinary frequency or urgency, odor in urine, blood in the urine then begin antibiotic If much worse such as fever, uncontrollable nausea and vomiting, but sicker feeling , then go to the emergency department

## 2023-11-19 NOTE — Progress Notes (Signed)
Subjective:   [name] is a 67 y.o. female who complains of possible urinary tract infection.   Chief Complaint  Patient presents with   Urinary Frequency    Started yesterday with urinary frequency and last night urgency started. Pain and pressure as well as burning began last night as well.    Here for possible UTI.   Yesterday stared having frequency, urgency, and had some incontinence last night.  Last night started having burning with urination.   No blood in urine, no odor in urine.   No cloudy urine.  No fever.  No body aches or chills.  Stomach hurts a little today, some nausea.   No diarrhea.  Last UTI was over a year ago.   Using nothing for current symptoms.    They deny any recent change in soap or hygiene products.   They deny  discharge or suspected genital infection.  Last antibiotic was with dental work few months ago.  No other aggravating or relieving factors.  No other c/o.  Past Medical History:  Diagnosis Date   Anemia    previously followed by Dr. Dalene Carrow for anemia and elevated platelets   Anxiety    C. difficile colitis 10/01/2012   treated by [facility]   Chronic fatigue syndrome    Closed wedge compression fracture of T8 vertebra (HCC) 06/2021   DDD (degenerative disc disease), lumbar 08/19/2014   and facet arthroplasty & left lumbar radiculopathy (Dr.Ramos)   Depression    Dyssynergia    dyssynergenic defecation, contributing to fecal incontinence.   Edema    Fibromyalgia    Gastroparesis    followed at [facility]   GERD (gastroesophageal reflux disease)    History of kidney stones    History of vertebral fracture 06/30/2021   Hyperlipidemia    Kidney stone    Lumbar radiculopathy    Migraine    Neuropathy    Obstructive sleep apnea    Does  wear  CPAP   Osteoporosis    Paresthesia    Dr. Antonietta Barcelona at [facility]   Pelvic floor dysfunction    pelvic floor dyssynergy   Plantar fasciitis 02/2011   R foot   Pneumonia    2012   PONV  (postoperative nausea and vomiting)    pt states has gastroparesis has difficulty taking antibiotics and narcotics has severe nausea and vomiting    Restless leg syndrome    S/P endometrial ablation 08/09/2006   Novasure Ablation   S/P epidural steroid injection 09/20/2014   Dr.Ramos   Tremor    Dr. Antonietta Barcelona   Urinary frequency    Urinary incontinence     Current Outpatient Medications on File Prior to Visit  Medication Sig Dispense Refill   acetaminophen (TYLENOL) 500 MG tablet Take 1,000 mg by mouth every 6 (six) hours as needed.     allopurinol (ZYLOPRIM) 300 MG tablet TAKE 1 TABLET BY MOUTH EVERY DAY 90 tablet 1   ALPRAZolam (XANAX) 0.25 MG tablet Take 1 tablet (0.25 mg total) by mouth 3 (three) times daily as needed for anxiety. 90 tablet 2   ARIPiprazole (ABILIFY) 5 MG tablet Take 1 tablet (5 mg total) by mouth daily. 90 tablet 1   Armodafinil 250 MG tablet Take 1 tablet (250 mg) by mouth daily as needed. 30 tablet 0   aspirin EC 81 MG tablet Take 81 mg by mouth in the morning. Swallow whole.     atorvastatin (LIPITOR) 40 MG tablet Take 1 tablet (  40 mg total) by mouth daily. 90 tablet 3   azelastine (ASTELIN) 0.1 % nasal spray Place 1 spray into both nostrils 2 (two) times daily. Use in each nostril as directed     CALCIUM PO Take 500 mg by mouth in the morning, at noon, and at bedtime.     cetirizine (ZYRTEC) 10 MG tablet Take 10 mg by mouth at bedtime.     DULoxetine (CYMBALTA) 60 MG capsule TAKE 1 CAPSULE BY MOUTH 2 TIMES DAILY. 180 capsule 3   fluticasone (FLONASE) 50 MCG/ACT nasal spray Place 2 sprays into both nostrils daily. 16 g 11   gabapentin (NEURONTIN) 300 MG capsule Take 300 mg by mouth 4 (four) times daily.     guaiFENesin (MUCINEX) 600 MG 12 hr tablet Take 600 mg by mouth 2 (two) times daily.     metoprolol tartrate (LOPRESSOR) 50 MG tablet TAKE 1 TABLET BY MOUTH TWICE A DAY 180 tablet 1   Multiple Vitamins-Minerals (PRESERVISION AREDS 2) CAPS Take 1 capsule by mouth  in the morning and at bedtime.     OXcarbazepine (TRILEPTAL) 150 MG tablet Take 150 mg by mouth in the morning and at bedtime.     pantoprazole (PROTONIX) 40 MG tablet Take 1 tablet (40 mg total) by mouth daily. (Patient taking differently: Take 40 mg by mouth at bedtime.) 90 tablet 0   PRESCRIPTION MEDICATION CPAP- At bedtime     Probiotic Product (PROBIOTIC ADVANCED PO) Take 1 capsule by mouth at bedtime.     RESTASIS 0.05 % ophthalmic emulsion Place 1 drop into both eyes in the morning and at bedtime.     SYSTANE ULTRA PF 0.4-0.3 % SOLN Place 1 drop into both eyes 3 (three) times daily as needed (for dryness).     traMADol (ULTRAM) 50 MG tablet Take 50 mg by mouth 3 (three) times daily as needed.     traZODone (DESYREL) 50 MG tablet Take 1 tablet (50 mg total) by mouth at bedtime. 90 tablet 1   Zoledronic Acid (RECLAST IV) Infusion: 09/09/2023     albuterol (VENTOLIN HFA) 108 (90 Base) MCG/ACT inhaler TAKE 2 PUFFS BY MOUTH EVERY 6 HOURS AS NEEDED FOR WHEEZE OR SHORTNESS OF BREATH (Patient not taking: Reported on 11/19/2023) 8.5 each 0   colchicine 0.6 MG tablet Take 1 tablet (0.6 mg total) by mouth as needed. (Patient not taking: Reported on 11/19/2023) 30 tablet 0   meclizine (ANTIVERT) 25 MG tablet Take 1 tablet (25 mg total) by mouth 3 (three) times daily as needed for dizziness. (Patient not taking: Reported on 11/19/2023) 30 tablet 0   methocarbamol (ROBAXIN) 500 MG tablet TAKE 1 TABLET BY MOUTH EVERY DAY AS NEEDED (Patient not taking: Reported on 11/19/2023) 30 tablet 2   ondansetron (ZOFRAN) 8 MG tablet Take 8 mg by mouth every 8 (eight) hours as needed for nausea. (Patient not taking: Reported on 11/19/2023)  0   promethazine (PHENERGAN) 25 MG tablet Take 25 mg by mouth every 6 (six) hours as needed for nausea or vomiting. (Patient not taking: Reported on 11/19/2023)     No current facility-administered medications on file prior to visit.    ROS as in subjective  Reviewed allergies,  medications, past medical, surgical, and social history.      Objective: BP 130/80   Pulse 72   Temp 97.8 F (36.6 C) (Tympanic)   Ht 5' 1.25" (1.556 m)   Wt 191 lb 9.6 oz (86.9 kg)   LMP 07/27/2006  BMI 35.91 kg/m    General appearance: alert, no distress, WD/WN Abdomen: +bs, soft, mild suprapubic tenderness, otherwise non tender, non distended, no masses, no hepatomegaly, no splenomegaly, no bruits Back: no CVA tenderness GU: deferred     Assessment: Encounter Diagnoses  Name Primary?   Urinary frequency Yes   Urinary urgency    Pain with urination      Plan: We discussed symptoms and concerns.  Urine culture sent.  Urinalysis equivocal today.  Advise good water intake, if symptoms persist or worsen in the next 48 hours then go ahead and begin antibiotic.  Await culture.  We discussed if symptoms significantly change such as fever, nausea and vomiting, or much worse then get reevaluated.  Timiya was seen today for urinary frequency.  Diagnoses and all orders for this visit:  Urinary frequency -     POCT Urinalysis DIP (Proadvantage Device) -     Urine Culture  Urinary urgency -     POCT Urinalysis DIP (Proadvantage Device) -     Urine Culture  Pain with urination -     POCT Urinalysis DIP (Proadvantage Device) -     Urine Culture  Other orders -     sulfamethoxazole-trimethoprim (BACTRIM DS) 800-160 MG tablet; Take 1 tablet by mouth 2 (two) times daily.    Return pending culture.

## 2023-11-22 LAB — URINE CULTURE

## 2023-11-22 NOTE — Progress Notes (Signed)
Results sent through MyChart

## 2023-11-23 ENCOUNTER — Other Ambulatory Visit: Payer: Self-pay | Admitting: Family Medicine

## 2023-11-23 DIAGNOSIS — N3000 Acute cystitis without hematuria: Secondary | ICD-10-CM

## 2023-11-23 MED ORDER — DOXYCYCLINE HYCLATE 100 MG PO TABS
100.0000 mg | ORAL_TABLET | Freq: Two times a day (BID) | ORAL | 0 refills | Status: DC
Start: 2023-11-23 — End: 2024-03-04

## 2023-11-23 NOTE — Progress Notes (Signed)
Having N/V and diarrhea and thinks its from the med. I will switch to Doxy

## 2023-12-05 ENCOUNTER — Telehealth: Payer: Self-pay | Admitting: Family Medicine

## 2023-12-05 NOTE — Telephone Encounter (Signed)
 [name]

## 2023-12-10 ENCOUNTER — Other Ambulatory Visit (HOSPITAL_COMMUNITY): Payer: Self-pay

## 2023-12-10 ENCOUNTER — Other Ambulatory Visit: Payer: Self-pay | Admitting: Rheumatology

## 2023-12-10 MED ORDER — ARMODAFINIL 250 MG PO TABS
250.0000 mg | ORAL_TABLET | Freq: Every day | ORAL | 0 refills | Status: DC | PRN
Start: 2023-12-10 — End: 2024-01-13
  Filled 2023-12-10: qty 30, 30d supply, fill #0

## 2023-12-10 NOTE — Telephone Encounter (Signed)
 Last Fill: 11/07/2023   Next Visit: 04/21/2024   Last Visit: 10/21/2023   Dx: Other fatigue    Current Dose per office note on 10/21/2023: continues to take armodafinil as prescribed.    Okay to refill Armodafinil?

## 2023-12-12 ENCOUNTER — Other Ambulatory Visit (HOSPITAL_COMMUNITY): Payer: Self-pay

## 2023-12-27 ENCOUNTER — Other Ambulatory Visit: Payer: Self-pay | Admitting: Rheumatology

## 2023-12-27 NOTE — Telephone Encounter (Signed)
Last Fill: 09/26/2023  Next Visit: 04/21/2024  Last Visit: 10/21/2023  Dx: Fibromyalgia   Current Dose per office note on 10/21/2023: methocarbamol this morning which has provided mild relief   Okay to refill Methocarbamol?

## 2023-12-31 DIAGNOSIS — M79674 Pain in right toe(s): Secondary | ICD-10-CM | POA: Diagnosis not present

## 2024-01-05 ENCOUNTER — Other Ambulatory Visit: Payer: Self-pay | Admitting: Medical

## 2024-01-05 ENCOUNTER — Other Ambulatory Visit: Payer: Self-pay | Admitting: Family Medicine

## 2024-01-06 NOTE — Telephone Encounter (Signed)
Has an appt in April 

## 2024-01-08 ENCOUNTER — Other Ambulatory Visit (INDEPENDENT_AMBULATORY_CARE_PROVIDER_SITE_OTHER): Payer: Medicare Other

## 2024-01-08 DIAGNOSIS — Z23 Encounter for immunization: Secondary | ICD-10-CM | POA: Diagnosis not present

## 2024-01-13 ENCOUNTER — Other Ambulatory Visit (HOSPITAL_COMMUNITY): Payer: Self-pay

## 2024-01-13 ENCOUNTER — Other Ambulatory Visit: Payer: Self-pay | Admitting: Rheumatology

## 2024-01-13 MED ORDER — ARMODAFINIL 250 MG PO TABS
250.0000 mg | ORAL_TABLET | Freq: Every day | ORAL | 0 refills | Status: DC | PRN
  Filled 2024-01-13: qty 30, 30d supply, fill #0

## 2024-01-13 NOTE — Telephone Encounter (Signed)
Last Fill: 12/10/2023   Next Visit: 04/21/2024   Last Visit: 10/21/2023   Dx: Other fatigue    Current Dose per office note on 10/21/2023: continues to take armodafinil as prescribed.    Okay to refill Armodafinil?

## 2024-02-03 ENCOUNTER — Other Ambulatory Visit: Payer: Self-pay | Admitting: Family Medicine

## 2024-02-03 DIAGNOSIS — J309 Allergic rhinitis, unspecified: Secondary | ICD-10-CM

## 2024-02-04 DIAGNOSIS — M5416 Radiculopathy, lumbar region: Secondary | ICD-10-CM | POA: Diagnosis not present

## 2024-02-07 ENCOUNTER — Other Ambulatory Visit: Payer: Medicare Other

## 2024-02-07 DIAGNOSIS — Z23 Encounter for immunization: Secondary | ICD-10-CM | POA: Diagnosis not present

## 2024-02-12 ENCOUNTER — Encounter: Payer: Self-pay | Admitting: Adult Health

## 2024-02-12 ENCOUNTER — Ambulatory Visit: Payer: Medicare Other | Admitting: Adult Health

## 2024-02-12 DIAGNOSIS — F411 Generalized anxiety disorder: Secondary | ICD-10-CM | POA: Diagnosis not present

## 2024-02-12 DIAGNOSIS — G4700 Insomnia, unspecified: Secondary | ICD-10-CM

## 2024-02-12 DIAGNOSIS — F331 Major depressive disorder, recurrent, moderate: Secondary | ICD-10-CM

## 2024-02-12 MED ORDER — ALPRAZOLAM 0.25 MG PO TABS
0.2500 mg | ORAL_TABLET | Freq: Three times a day (TID) | ORAL | 2 refills | Status: DC | PRN
Start: 2024-02-12 — End: 2024-06-05

## 2024-02-12 NOTE — Progress Notes (Signed)
 [name] [id number] July 22, 1956 68 y.o.  Subjective:   Patient ID:  [name] is a 68 y.o. (DOB 1956/03/12) female.  Chief Complaint: No chief complaint on file.   HPI [name] presents to the office today for follow-up of anxiety, depression, and insomnia.  Describes mood today as "ok". Pleasant. Tearful at times. Mood symptoms - reports some situational depression, anxiety and irritability. Reports lower interest and motivation. Reports some worry, rumination and over thinking. Reports situational stressors - husband - mother's health issues. Mood is stable. Stating "I feel like I'm doing ok - managing things". Feels like medications are helpful. Taking medications as prescribed.  Energy levels lower. Active, does not have a regular exercise. Enjoys some usual interests and activities. Married. Lives with husband. Has 2 sons - one local and another one in [place]. Mother local - 38 - has finished cancer treatments. Spending time with family. Appetite adequate. Weight gain. Sleeps better some nights than others. Averages 4 to 6 hours. Denies daytime napping. Using CPAP machine. Focus and concentration stable. Completing tasks. Managing aspects of household. Retired. Denies SI or HI.  Denies AH or VH Denies self harm. Denies substance use.  Bariatric surgery 11/20.   PHQ2-9    Flowsheet Row Office Visit from 08/28/2023 in [facility] Most recent reading at 08/28/2023  3:10 PM Office Visit from 11/05/2022 in [facility] Of [place]. [facility] Most recent reading at 11/05/2022  9:32 AM Office Visit from 09/25/2022 in [facility] - [facility] Dept Of [place]. [facility] Most recent reading at 09/25/2022  9:38 AM Office Visit from 07/26/2022 in [facility] Most recent reading at 07/26/2022  2:41 PM Video Visit from 07/26/2022 in [facility] - A Dept Of [place]. [facility] Most recent reading at 07/26/2022  9:13 AM  PHQ-2 Total Score 0 1 0 2 1  PHQ-9 Total Score 4 -- -- 9 --      Flowsheet Row IV Medication 120 from 09/09/2023 in [facility] ED to Hosp-Admission (Discharged) from 07/30/2022 in [facility] from 12/31/2021 in [facility] Department at [facility]  C-SSRS RISK CATEGORY No Risk No Risk No Risk        Review of Systems:  Review of Systems  Musculoskeletal:  Negative for gait problem.  Neurological:  Negative for tremors.  Psychiatric/Behavioral:         Please refer to HPI   Medications: I have reviewed the patient's current medications.  Current Outpatient Medications  Medication Sig Dispense Refill   acetaminophen (TYLENOL) 500 MG tablet Take 1,000 mg by mouth every 6 (six) hours as needed.     albuterol (VENTOLIN HFA) 108 (90 Base) MCG/ACT inhaler TAKE 2 PUFFS BY MOUTH EVERY 6 HOURS AS NEEDED FOR WHEEZE OR SHORTNESS OF BREATH (Patient not taking: Reported on 11/19/2023) 8.5 each 0   allopurinol (ZYLOPRIM) 300 MG tablet TAKE 1 TABLET BY MOUTH EVERY DAY 90 tablet 0   ALPRAZolam (XANAX) 0.25 MG tablet Take 1 tablet (0.25 mg total) by mouth 3 (three) times daily as needed for anxiety. 90 tablet 2   ARIPiprazole (ABILIFY) 5 MG tablet Take 1 tablet (5 mg total) by mouth daily. 90 tablet 1   Armodafinil 250 MG tablet Take 1 tablet (250 mg total) by mouth daily as needed. 30 tablet 0  aspirin EC 81 MG tablet Take 81 mg by mouth in the morning. Swallow whole.     atorvastatin (LIPITOR) 40 MG tablet TAKE 1 TABLET BY MOUTH EVERY DAY 90 tablet 0   azelastine (ASTELIN) 0.1 % nasal spray Place 1 spray into both nostrils 2 (two) times daily. Use in each nostril as directed     CALCIUM PO Take 500 mg by mouth in the morning, at noon, and at bedtime.     cetirizine (ZYRTEC) 10 MG tablet Take 10 mg by mouth at bedtime.     colchicine 0.6 MG tablet Take 1 tablet (0.6 mg total)  by mouth as needed. (Patient not taking: Reported on 11/19/2023) 30 tablet 0   doxycycline (VIBRA-TABS) 100 MG tablet Take 1 tablet (100 mg total) by mouth 2 (two) times daily. 20 tablet 0   DULoxetine (CYMBALTA) 60 MG capsule TAKE 1 CAPSULE BY MOUTH 2 TIMES DAILY. 180 capsule 3   fluticasone (FLONASE) 50 MCG/ACT nasal spray SPRAY 2 SPRAYS INTO EACH NOSTRIL EVERY DAY 48 mL 0   gabapentin (NEURONTIN) 300 MG capsule Take 300 mg by mouth 4 (four) times daily.     guaiFENesin (MUCINEX) 600 MG 12 hr tablet Take 600 mg by mouth 2 (two) times daily.     meclizine (ANTIVERT) 25 MG tablet Take 1 tablet (25 mg total) by mouth 3 (three) times daily as needed for dizziness. (Patient not taking: Reported on 11/19/2023) 30 tablet 0   methocarbamol (ROBAXIN) 500 MG tablet TAKE 1 TABLET BY MOUTH EVERY DAY AS NEEDED 30 tablet 2   metoprolol tartrate (LOPRESSOR) 50 MG tablet TAKE 1 TABLET BY MOUTH TWICE A DAY 180 tablet 0   Multiple Vitamins-Minerals (PRESERVISION AREDS 2) CAPS Take 1 capsule by mouth in the morning and at bedtime.     ondansetron (ZOFRAN) 8 MG tablet Take 8 mg by mouth every 8 (eight) hours as needed for nausea. (Patient not taking: Reported on 11/19/2023)  0   OXcarbazepine (TRILEPTAL) 150 MG tablet Take 150 mg by mouth in the morning and at bedtime.     pantoprazole (PROTONIX) 40 MG tablet Take 1 tablet (40 mg total) by mouth daily. (Patient taking differently: Take 40 mg by mouth at bedtime.) 90 tablet 0   PRESCRIPTION MEDICATION CPAP- At bedtime     Probiotic Product (PROBIOTIC ADVANCED PO) Take 1 capsule by mouth at bedtime.     promethazine (PHENERGAN) 25 MG tablet Take 25 mg by mouth every 6 (six) hours as needed for nausea or vomiting. (Patient not taking: Reported on 11/19/2023)     RESTASIS 0.05 % ophthalmic emulsion Place 1 drop into both eyes in the morning and at bedtime.     SYSTANE ULTRA PF 0.4-0.3 % SOLN Place 1 drop into both eyes 3 (three) times daily as needed (for dryness).      traMADol (ULTRAM) 50 MG tablet Take 50 mg by mouth 3 (three) times daily as needed.     traZODone (DESYREL) 50 MG tablet Take 1 tablet (50 mg total) by mouth at bedtime. 90 tablet 1   Zoledronic Acid (RECLAST IV) Infusion: 09/09/2023     No current facility-administered medications for this visit.    Medication Side Effects: None  Allergies:  Allergies  Allergen Reactions   Erythromycin Nausea Only and Other (See Comments)    Abdominal pain, also   Meperidine Hcl Other (See Comments)    Made the patient feel hot and her heart raced- made her feel "flushed," also  Polyethyl Glyc-Propyl Glyc Pf Nausea And Vomiting and Other (See Comments)    RN confirmed with patient 9/4 (Miralax)   Polyethylene Glycol 3350 Other (See Comments)    Other Reaction(s): GI Intolerance  RN confirmed with patient 9/4 (Miralax)   Zolpidem Other (See Comments)    Lost balance   Dilaudid [Hydromorphone Hcl] Itching    Past Medical History:  Diagnosis Date   Anemia    previously followed by Dr. Dalene Carrow for anemia and elevated platelets   Anxiety    C. difficile colitis 10/01/2012   treated by [facility]   Chronic fatigue syndrome    Closed wedge compression fracture of T8 vertebra (HCC) 06/2021   DDD (degenerative disc disease), lumbar 08/19/2014   and facet arthroplasty & left lumbar radiculopathy (Dr.Ramos)   Depression    Dyssynergia    dyssynergenic defecation, contributing to fecal incontinence.   Edema    Fibromyalgia    Gastroparesis    followed at [facility]   GERD (gastroesophageal reflux disease)    History of kidney stones    History of vertebral fracture 06/30/2021   Hyperlipidemia    Kidney stone    Lumbar radiculopathy    Migraine    Neuropathy    Obstructive sleep apnea    Does  wear  CPAP   Osteoporosis    Paresthesia    Dr. Antonietta Barcelona at [facility]   Pelvic floor dysfunction    pelvic floor dyssynergy   Plantar fasciitis 02/2011   R foot   Pneumonia    2012   PONV  (postoperative nausea and vomiting)    pt states has gastroparesis has difficulty taking antibiotics and narcotics has severe nausea and vomiting    Restless leg syndrome    S/P endometrial ablation 08/09/2006   Novasure Ablation   S/P epidural steroid injection 09/20/2014   Dr.Ramos   Tremor    Dr. Antonietta Barcelona   Urinary frequency    Urinary incontinence     Past Medical History, Surgical history, Social history, and Family history were reviewed and updated as appropriate.   Please see review of systems for further details on the patient's review from today.   Objective:   Physical Exam:  LMP 07/27/2006   Physical Exam Constitutional:      General: She is not in acute distress. Musculoskeletal:        General: No deformity.  Neurological:     Mental Status: She is alert and oriented to person, place, and time.     Coordination: Coordination normal.  Psychiatric:        Attention and Perception: Attention and perception normal. She does not perceive auditory or visual hallucinations.        Mood and Affect: Mood normal. Mood is not anxious or depressed. Affect is not labile, blunt, angry or inappropriate.        Speech: Speech normal.        Behavior: Behavior normal.        Thought Content: Thought content normal. Thought content is not paranoid or delusional. Thought content does not include homicidal or suicidal ideation. Thought content does not include homicidal or suicidal plan.        Cognition and Memory: Cognition and memory normal.        Judgment: Judgment normal.     Comments: Insight intact     Lab Review:     Component Value Date/Time   NA 140 08/28/2023 1625   K 4.8 08/28/2023 1625  CL 101 08/28/2023 1625   CO2 26 08/28/2023 1625   GLUCOSE 93 08/28/2023 1625   GLUCOSE 114 (H) 06/17/2023 0955   BUN 16 08/28/2023 1625   CREATININE 0.70 08/28/2023 1625   CREATININE 0.83 06/17/2023 0955   CALCIUM 9.2 08/28/2023 1625   PROT 6.5 08/28/2023 1625   ALBUMIN  4.0 08/28/2023 1625   AST 23 08/28/2023 1625   ALT 19 08/28/2023 1625   ALKPHOS 137 (H) 08/28/2023 1625   BILITOT <0.2 08/28/2023 1625   GFRNONAA >60 08/05/2022 0326   GFRNONAA 68 07/04/2020 1129   GFRAA 81 11/07/2020 1029   GFRAA 79 07/04/2020 1129       Component Value Date/Time   WBC 6.9 06/17/2023 0955   RBC 4.26 06/17/2023 0955   HGB 12.4 06/17/2023 0955   HGB 12.5 07/17/2022 1614   HGB 11.7 05/02/2011 1412   HCT 37.5 06/17/2023 0955   HCT 38.0 07/17/2022 1614   HCT 37.1 05/02/2011 1412   PLT 316 06/17/2023 0955   PLT 431 07/17/2022 1614   MCV 88.0 06/17/2023 0955   MCV 91 07/17/2022 1614   MCV 94.4 05/02/2011 1412   MCH 29.1 06/17/2023 0955   MCHC 33.1 06/17/2023 0955   RDW 14.3 06/17/2023 0955   RDW 13.2 07/17/2022 1614   RDW 18.3 (H) 05/02/2011 1412   LYMPHSABS 1,967 06/17/2023 0955   LYMPHSABS 3.2 (H) 07/17/2022 1614   LYMPHSABS 2.4 05/02/2011 1412   MONOABS 0.9 08/02/2022 0341   MONOABS 0.8 05/02/2011 1412   EOSABS 186 06/17/2023 0955   EOSABS 0.3 07/17/2022 1614   BASOSABS 83 06/17/2023 0955   BASOSABS 0.1 07/17/2022 1614   BASOSABS 0.1 05/02/2011 1412    No results found for: "POCLITH", "LITHIUM"   No results found for: "PHENYTOIN", "PHENOBARB", "VALPROATE", "CBMZ"   .res Assessment: Plan:     Plan:  Xanax 0.25mg  TID Cymbalta 60mg  BID Abilify 5mg  daily Trazadone 50mg  at bedtime.  RTC 3 months  Patient advised to contact office with any questions, adverse effects, or acute worsening in signs and symptoms.  Discussed potential metabolic side effects associated with atypical antipsychotics, as well as potential risk for movement side effects. Advised pt to contact office if movement side effects occur.   Discussed potential benefits, risk, and side effects of benzodiazepines to include potential risk of tolerance and dependence, as well as possible drowsiness.  Advised patient not to drive if experiencing drowsiness and to take lowest possible  effective dose to minimize risk of dependence and tolerance.    There are no diagnoses linked to this encounter.   Please see After Visit Summary for patient specific instructions.  Future Appointments  Date Time Provider Department Center  02/12/2024 10:00 AM Monterrio Gerst, Thereasa Solo, NP CP-CP None  03/11/2024  9:30 AM Joselyn Arrow, MD P[facility]  04/21/2024  9:40 AM Pollyann Savoy, MD [facility] None  07/08/2024 10:45 AM PFSM-[facility] CLINICAL SUPPORT PFM-PFM [facility]SM    No orders of the defined types were placed in this encounter.   -------------------------------

## 2024-02-13 DIAGNOSIS — M5416 Radiculopathy, lumbar region: Secondary | ICD-10-CM | POA: Diagnosis not present

## 2024-02-14 ENCOUNTER — Other Ambulatory Visit: Payer: Self-pay | Admitting: Rheumatology

## 2024-02-14 ENCOUNTER — Other Ambulatory Visit (HOSPITAL_COMMUNITY): Payer: Self-pay

## 2024-02-14 NOTE — Telephone Encounter (Signed)
 Last Fill: 01/13/2024    Next Visit: 04/21/2024   Last Visit: 10/21/2023   Dx: Other fatigue    Current Dose per office note on 10/21/2023: continues to take armodafinil as prescribed.    Okay to refill Armodafinil?

## 2024-02-16 MED ORDER — ARMODAFINIL 250 MG PO TABS
250.0000 mg | ORAL_TABLET | Freq: Every day | ORAL | 0 refills | Status: DC | PRN
  Filled 2024-02-16: qty 30, 30d supply, fill #0

## 2024-02-17 ENCOUNTER — Other Ambulatory Visit (HOSPITAL_COMMUNITY): Payer: Self-pay

## 2024-02-29 DIAGNOSIS — Z20822 Contact with and (suspected) exposure to covid-19: Secondary | ICD-10-CM | POA: Diagnosis not present

## 2024-02-29 DIAGNOSIS — J069 Acute upper respiratory infection, unspecified: Secondary | ICD-10-CM | POA: Diagnosis not present

## 2024-02-29 DIAGNOSIS — R52 Pain, unspecified: Secondary | ICD-10-CM | POA: Diagnosis not present

## 2024-03-02 ENCOUNTER — Telehealth: Payer: Self-pay | Admitting: Internal Medicine

## 2024-03-02 NOTE — Telephone Encounter (Signed)
   Copied from CRM [id number]. Topic: Clinical - Medication Question >> Mar 02, 2024  8:45 AM Carlatta H wrote: Reason for CRM: Please call patient about prescribing mediation for cough and wheezing//Patient was seen in the urgent care on 4/5//

## 2024-03-02 NOTE — Telephone Encounter (Signed)
Pt is scheduled for wednesday

## 2024-03-02 NOTE — Telephone Encounter (Signed)
 Pt is scheduled for Wednesday, I changed to in office from virtual per Dr. Lynelle Doctor

## 2024-03-03 NOTE — Progress Notes (Unsigned)
 No chief complaint on file.  She was seen in [facility] on 4/5. Symptoms had started on 4/3 with nasal congestion and cough. 4/4 she started wheezing. At the [facility], she had negative tests for flu and COVID. Her exam was normal--02 sat 97%, no wheezing on exam, lungs were clear.  Felt to be a viral illness, and she was prescribed albuterol to be used prn wheezing/SOB, and Tessalon Perles for cough.     PMH, PSH, SH reviewed   ROS:    PHYSICAL EXAM:  LMP 07/27/2006       ASSESSMENT/PLAN:

## 2024-03-04 ENCOUNTER — Ambulatory Visit (INDEPENDENT_AMBULATORY_CARE_PROVIDER_SITE_OTHER): Admitting: Family Medicine

## 2024-03-04 ENCOUNTER — Encounter: Payer: Self-pay | Admitting: Family Medicine

## 2024-03-04 VITALS — BP 120/70 | HR 76 | Temp 99.0°F | Ht 61.25 in | Wt 200.8 lb

## 2024-03-04 DIAGNOSIS — J069 Acute upper respiratory infection, unspecified: Secondary | ICD-10-CM

## 2024-03-04 NOTE — Patient Instructions (Signed)
  Stay well hydrated. Continue the mucinex twice daily, and all of your allergy medications. You can stop the mucinex when you no longer having much congestion or drainage. Use the tessalon perles as needed for cough. If you have sinus pain, be sure to use the sinus rinses as needed.  Contact us if you develop fever, increased sinus pain, persistently discolored mucus or phlegm. Please return if you have increased shortness of breath or any pain with breathing, or persistently high fever.

## 2024-03-10 NOTE — Progress Notes (Unsigned)
 No chief complaint on file.  Patient presents for 6 month follow-up on chronic problems.  Pre-diabetes and obesity:  A1c was up to 6.3% in 01/2023, down to 5.9% in 08/2023.  She has since gained more weight. She had Roux-En-Y on 09/29/2019.  She had gotten down into the 160's.   Wt Readings from Last 3 Encounters:  03/04/24 200 lb 12.8 oz (91.1 kg)  11/19/23 191 lb 9.6 oz (86.9 kg)  10/21/23 193 lb 9.6 oz (87.8 kg)      She sees GI regularly. She has some chronic nausea and heaving. She underwent EGD on 08/13/23, which was normal. Some retained staples were noted, which were removed. GERD controlled with PPI. She was noted to have elevated LFT's per Dr. Fatima Sanger labs in July 2024 (had been normal at GI earlier that month). She had f/u with Dr. Fransisco Beau in 08/2023.  Repeat labs were okay. C-met 08/2023 showed normal LFT's (K+ 5.3) NON-immune to Hep B and Hep A. She has since gotten 2 doses of heplisav-B and one dose of hepatitis A vaccine in 12/2023.    Hyperlipidemia follow-up:  Patient is trying to follow a low-fat, low cholesterol diet as best she can.  Eats red meat 1-2x/week. Occasional eggs, no butter or cheese.  Uses vinaigrette dressing.  Compliant with medications (atorvastatin 40mg ) and denies medication side effects.  Lipids were at goal on this regimen last year, due for recheck.  Lab Results  Component Value Date   CHOL 110 02/06/2023   HDL 43 02/06/2023   LDLCALC 42 02/06/2023   TRIG 148 02/06/2023   CHOLHDL 2.6 02/06/2023     Hypertension: (never formally diagnosed, because had been on diuretics for fluid retention (stopped a while ago) and beta blockers for tachycardia and tremor--but high BP's noted when not taking these medications in past).   She continues on metoprolol 50mg  BID (originally prescribed by Dr. Eden Emms, for tachycardia). Denies chest pain, palpitations/tachycardia, lightheadedness, edema.    BP Readings from Last 3 Encounters:  03/04/24 120/70   11/19/23 130/80  10/21/23 132/87     She is under the care of rheumatology clinic for fibromyalgia, osteoarthritis, degenerative disc disease, chronic fatigue, osteoporosis.    She continues on cymbalta, gabapentin (also for neuropathy in feet), and robaxin 500mg  BID prn. They prescribe her armodafinil for her chronic fatigue.  She takes 1/2 pill daily. Her fibromyalgia hasn't been too bad, has pain daily but reports it is manageable most of the time.    She had T12 and L1 compression fractures after a fall in 01/2020. She had worsening back pain in July/August 2022, and was found to have new, acute compression fractures. She ultimately had kyphoplasty done 10/2021 by Dr. Conchita Paris. She was started on Forteo injections by rheum in 07/2021. DEXA in 2018 was normal.  DEXA 06/2020 T-2.0 R fem neck, osteopenia Dexa was repeated 06/2023 by rheum--T-1.9 L wrist, -1.4 L fem neck. They switched her from Forteo to reclast, had infusion in 08/2023.  She has been having worsening back pain for the last few months. She saw Dr. Ethelene Hal in March and underwent left L4-5 ESI on 02/04/24.  She has f/u with ortho later this month.  ***UPDATE   Gout--she continues on allopurinol 300mg  daily, and hasn't had any flares in several years.  Lab Results  Component Value Date   LABURIC 5.1 06/17/2023    She has OSA, under the care of Dr. Mayford Knife. She is on CPAP.  Energy is improved, but still requires  Nuvigil. She doesn't sleep well all the time, so sometimes doesn't feel refreshed. Sleeping better with trazodone.   She is under the care of Dr. Tonuzzi for RLS, neuropathy (pedal paresthesias--he suspects small fiber peripheral neuropathy), tremor (benign essential vs accentuated physiologic tremor), migraines. She takes gabapentin for restless legs as well as her neuropathy pain. She takes oxcarbazepine for the neuropathy.  She last saw Dr. Tonuzzi in 09/2023, and he recommended titrating up gabapentin from 300 mg to 600  mg at bedtime. RLS is well controlled. Very rare migraine.  No longer having frequent headaches.   Depression:  Sees Regina at [facility] [place], continues on Cymbalta, Abilify and Trazodone.     PMH, PSH, SH reviewed    ROS: no fever, chills, URI symptoms (improved from recent illness). No chest pain, palpitations. No abdominal pain, bowel changes.  Nausea, per HPI. No dysphagia Headaches (temporal) per HPI. Fibromyalgia and fatigue unchanged. Moods are controlled. Back pain ***  Weight gain    PHYSICAL EXAM:  LMP 07/27/2006   Wt Readings from Last 3 Encounters:  03/04/24 200 lb 12.8 oz (91.1 kg)  11/19/23 191 lb 9.6 oz (86.9 kg)  10/21/23 193 lb 9.6 oz (87.8 kg)   Pleasant female, in good spirits, in no distress HEENT: conjunctiva and sclera are clear, EOMI.   Neck: no lymphadenopathy or mass, no bruit. Heart: regular rate and rhythm Lungs: clear bilaterally, no wheezes, rales, ronchi Abdomen: soft, nontender. No organomegaly or mass Back: no spinal or CVA tenderness, no SI tenderness. No trigger points or back tenderness Extremities: no edema, normal pulses Skin: normal turgor, no rash Psych: normal mood, affect, hygiene and grooming Neuro: alert and oriented. Normal gait. No significant tremor noted.   ASSESSMENT/PLAN:   She has many docs prescribing for her--important that we keep her meds from other doctors updated properly Confirm dosing of trileptal and gabapentin (neuro wanted her to increase to 600 mg at bedtime)   Will need 2nd Hep A vaccine in August--

## 2024-03-11 ENCOUNTER — Encounter: Payer: Self-pay | Admitting: Family Medicine

## 2024-03-11 ENCOUNTER — Ambulatory Visit (INDEPENDENT_AMBULATORY_CARE_PROVIDER_SITE_OTHER): Payer: Medicare Other | Admitting: Family Medicine

## 2024-03-11 VITALS — BP 116/78 | HR 65 | Ht 62.5 in | Wt 202.0 lb

## 2024-03-11 DIAGNOSIS — J302 Other seasonal allergic rhinitis: Secondary | ICD-10-CM | POA: Diagnosis not present

## 2024-03-11 DIAGNOSIS — M1A9XX0 Chronic gout, unspecified, without tophus (tophi): Secondary | ICD-10-CM

## 2024-03-11 DIAGNOSIS — M797 Fibromyalgia: Secondary | ICD-10-CM | POA: Diagnosis not present

## 2024-03-11 DIAGNOSIS — F324 Major depressive disorder, single episode, in partial remission: Secondary | ICD-10-CM

## 2024-03-11 DIAGNOSIS — E782 Mixed hyperlipidemia: Secondary | ICD-10-CM

## 2024-03-11 DIAGNOSIS — K760 Fatty (change of) liver, not elsewhere classified: Secondary | ICD-10-CM

## 2024-03-11 DIAGNOSIS — M810 Age-related osteoporosis without current pathological fracture: Secondary | ICD-10-CM

## 2024-03-11 DIAGNOSIS — G4733 Obstructive sleep apnea (adult) (pediatric): Secondary | ICD-10-CM | POA: Diagnosis not present

## 2024-03-11 DIAGNOSIS — R7301 Impaired fasting glucose: Secondary | ICD-10-CM | POA: Diagnosis not present

## 2024-03-11 DIAGNOSIS — J45909 Unspecified asthma, uncomplicated: Secondary | ICD-10-CM

## 2024-03-11 DIAGNOSIS — K219 Gastro-esophageal reflux disease without esophagitis: Secondary | ICD-10-CM | POA: Diagnosis not present

## 2024-03-11 DIAGNOSIS — G629 Polyneuropathy, unspecified: Secondary | ICD-10-CM

## 2024-03-11 DIAGNOSIS — Z5181 Encounter for therapeutic drug level monitoring: Secondary | ICD-10-CM

## 2024-03-11 DIAGNOSIS — Z9884 Bariatric surgery status: Secondary | ICD-10-CM

## 2024-03-11 DIAGNOSIS — I10 Essential (primary) hypertension: Secondary | ICD-10-CM | POA: Diagnosis not present

## 2024-03-11 LAB — POCT GLYCOSYLATED HEMOGLOBIN (HGB A1C): Hemoglobin A1C: 6.2 % — AB (ref 4.0–5.6)

## 2024-03-11 MED ORDER — ALLOPURINOL 300 MG PO TABS: 300.0000 mg | ORAL_TABLET | Freq: Every day | ORAL | 3 refills | Status: AC

## 2024-03-11 MED ORDER — ATORVASTATIN CALCIUM 40 MG PO TABS: 40.0000 mg | ORAL_TABLET | Freq: Every day | ORAL | 3 refills | Status: AC

## 2024-03-11 MED ORDER — METOPROLOL TARTRATE 50 MG PO TABS: 50.0000 mg | ORAL_TABLET | Freq: Two times a day (BID) | ORAL | 1 refills | Status: DC

## 2024-03-11 NOTE — Patient Instructions (Addendum)
 I encourage you to ask Leward Record for a recommendation for a therapist.  You reported a lot of stress related to your mom, which might be contributing to some stress-eating.  Try and eat fruit, vegetables and/or yogurt as a snack, rather than crackers.  Please talk to Dr. Elizebeth Gums about your gabapentin dose.  You were very sleepy in the office today and I'm worried about you driving. If it isn't helping, perhaps the daytime doses should be dropped back some, under his guidance.

## 2024-03-12 ENCOUNTER — Encounter: Payer: Self-pay | Admitting: Family Medicine

## 2024-03-12 LAB — CMP14+EGFR
ALT: 20 IU/L (ref 0–32)
AST: 26 IU/L (ref 0–40)
Albumin: 3.8 g/dL — ABNORMAL LOW (ref 3.9–4.9)
Alkaline Phosphatase: 103 IU/L (ref 44–121)
BUN/Creatinine Ratio: 17 (ref 12–28)
BUN: 15 mg/dL (ref 8–27)
Bilirubin Total: <0.2 mg/dL (ref 0.0–1.2)
CO2: 26 mmol/L (ref 20–29)
Calcium: 8.9 mg/dL (ref 8.7–10.3)
Chloride: 103 mmol/L (ref 96–106)
Creatinine, Ser: 0.87 mg/dL (ref 0.57–1.00)
Globulin, Total: 2.3 g/dL (ref 1.5–4.5)
Glucose: 105 mg/dL — ABNORMAL HIGH (ref 70–99)
Potassium: 5 mmol/L (ref 3.5–5.2)
Sodium: 143 mmol/L (ref 134–144)
Total Protein: 6.1 g/dL (ref 6.0–8.5)
eGFR: 73 mL/min/{1.73_m2} (ref >59)

## 2024-03-12 LAB — LIPID PANEL
Chol/HDL Ratio: 2.9 ratio (ref 0.0–4.4)
Cholesterol, Total: 124 mg/dL (ref 100–199)
HDL: 43 mg/dL (ref >39)
LDL Chol Calc (NIH): 57 mg/dL (ref 0–99)
Triglycerides: 134 mg/dL (ref 0–149)
VLDL Cholesterol Cal: 24 mg/dL (ref 5–40)

## 2024-03-12 LAB — URIC ACID: Uric Acid: 5.5 mg/dL (ref 3.0–7.2)

## 2024-03-23 ENCOUNTER — Other Ambulatory Visit (HOSPITAL_COMMUNITY): Payer: Self-pay

## 2024-03-23 ENCOUNTER — Other Ambulatory Visit: Payer: Self-pay | Admitting: Rheumatology

## 2024-03-23 MED ORDER — ARMODAFINIL 250 MG PO TABS
250.0000 mg | ORAL_TABLET | Freq: Every day | ORAL | 0 refills | Status: DC | PRN
  Filled 2024-03-23: qty 30, 30d supply, fill #0

## 2024-03-23 NOTE — Telephone Encounter (Signed)
 Last Fill: 02/16/2024   Next Visit: 04/21/2024   Last Visit: 10/21/2023   Dx: Other fatigue    Current Dose per office note on 10/21/2023: continues to take armodafinil  as prescribed.    Okay to refill Armodafinil ?

## 2024-03-25 DIAGNOSIS — Z96653 Presence of artificial knee joint, bilateral: Secondary | ICD-10-CM | POA: Diagnosis not present

## 2024-03-25 DIAGNOSIS — Z471 Aftercare following joint replacement surgery: Secondary | ICD-10-CM | POA: Diagnosis not present

## 2024-04-06 DIAGNOSIS — K760 Fatty (change of) liver, not elsewhere classified: Secondary | ICD-10-CM | POA: Diagnosis not present

## 2024-04-07 NOTE — Progress Notes (Signed)
 Office Visit Note  Patient: [name]             Date of Birth: 08/06/1956           MRN: [id number]             PCP: Roosvelt Colla, MD Referring: Roosvelt Colla, MD Visit Date: 04/21/2024 Occupation: @GUAROCC @  Subjective:  Medication management  History of Present Illness: [name] is a 68 y.o. female with osteoporosis, gout, osteoarthritis, degenerative disc disease and fibromyalgia syndrome.  She returns today after her last visit in November 2024.  She had IV Reclast  infusion in October 2024.  She experienced some discomfort for 24 hours and then the symptoms resolved.  He has been taking calcium  and vitamin D .  He is currently not exercising.  She has not had a gout flare in a long time.  She has been taking allopurinol  300 mg a day without any interruption.  She continues to have some discomfort in the trapezius region, cervical and lumbar spine region.  She has been followed by Dr. Mildred All and has been getting injections in her lower back.  She has some stiffness in her hands and her hips.  Bilateral knee joint replacements are doing well.  She has generalized pain and discomfort fibromyalgia.    Activities of Daily Living:  Patient reports morning stiffness for 0 minutes.   Patient Reports nocturnal pain.  Difficulty dressing/grooming: Denies Difficulty climbing stairs: Denies Difficulty getting out of chair: Denies Difficulty using hands for taps, buttons, cutlery, and/or writing: Denies  Review of Systems  Constitutional:  Positive for fatigue.  HENT:  Positive for mouth dryness. Negative for mouth sores.   Eyes:  Positive for dryness.  Respiratory:  Positive for shortness of breath.        On exertion   Cardiovascular:  Negative for chest pain and palpitations.  Gastrointestinal:  Positive for constipation and diarrhea. Negative for blood in stool.  Endocrine: Negative for increased urination.  Genitourinary:  Positive for involuntary urination.  Musculoskeletal:   Positive for joint pain, joint pain, myalgias and myalgias. Negative for gait problem, joint swelling, muscle weakness, morning stiffness and muscle tenderness.  Skin:  Negative for color change, rash, hair loss and sensitivity to sunlight.  Allergic/Immunologic: Negative for susceptible to infections.  Neurological:  Positive for headaches. Negative for dizziness.  Hematological:  Negative for swollen glands.  Psychiatric/Behavioral:  Positive for sleep disturbance. Negative for depressed mood. The patient is nervous/anxious.     PMFS History:  Patient Active Problem List   Diagnosis Date Noted   Skin ulcer of left great toe, limited to breakdown of skin (HCC) 09/25/2022   Medication monitoring encounter 08/29/2022   History of Clostridioides difficile colitis    Infected orthopedic implant (HCC)    Charcot's arthropathy of forefoot    Right foot pain 07/31/2022   Osteomyelitis of great toe of right foot (HCC) 07/30/2022   Bipolar disorder (HCC) 07/30/2022   Osteopenia 08/04/2021   Acute right-sided weakness 01/22/2021   Peripheral polyneuropathy 01/22/2021   History of migraine headaches 01/22/2021   Difficulty with speech    Gastric bypass status for obesity 09/29/2019   Aftercare 08/12/2018   Pain in left knee 06/03/2018   Dyspnea 12/26/2017   Restrictive lung disease secondary to obesity 12/26/2017   Atypical chest pain 12/06/2017   Sinus tachycardia 12/06/2017   Chest pain 12/06/2017   DJD (degenerative joint disease), cervical 12/24/2016   Primary osteoarthritis of both hips  12/24/2016   Primary osteoarthritis of both knees 12/24/2016   H/O total knee replacement, right 12/24/2016   Spondylosis of lumbar region without myelopathy or radiculopathy 12/24/2016   Lumbosacral spondylosis without myelopathy 12/24/2016   Acute gout 05/30/2016   Gout 05/30/2016   Myalgia 03/29/2016   Other long term (current) drug therapy 03/29/2016   Idiopathic peripheral neuropathy  03/29/2016   Cannot sleep 03/29/2016   Migraine without aura and responsive to treatment 03/29/2016   Multifocal myoclonus 03/29/2016   Restless leg 03/29/2016   Has a tremor 03/29/2016   History of aspiration pneumonitis 01/25/2016   History of acute bronchitis 01/25/2016   LPRD (laryngopharyngeal reflux disease) 01/25/2016   Imbalance 01/09/2016   Serotonin syndrome 12/22/2015   Essential hypertension 09/10/2015   OA (osteoarthritis) of knee 09/05/2015   Obesity 05/17/2015   OSA (obstructive sleep apnea) 03/16/2013   Iron deficiency 11/06/2012   Thrombocythemia 11/06/2012   Leukocytosis 11/05/2012   Impaired fasting glucose 07/23/2012   Bronchitis 04/03/2012   Kidney stone on left side 03/05/2012   Kidney cysts 03/04/2012   Loose stools 03/04/2012   Urinary frequency 12/04/2011   Restless leg syndrome 12/04/2011   Polypharmacy 12/04/2011   Bladder incontinence 12/04/2011   Fibromyalgia    Mixed hyperlipidemia    Edema    S/P endometrial ablation    Allergic rhinitis 09/18/2011   Depression, major, single episode, in partial remission (HCC) 04/21/2011   PRECORDIAL PAIN 02/17/2010   Anxiety state 01/30/2010   Migraine 01/30/2010   GERD without esophagitis 01/30/2010   Gastroparesis 01/30/2010    Past Medical History:  Diagnosis Date   Anemia    previously followed by Dr. Nonah Bayley for anemia and elevated platelets   Anxiety    C. difficile colitis 10/01/2012   treated by [facility]   Chronic fatigue syndrome    Closed wedge compression fracture of T8 vertebra (HCC) 06/2021   DDD (degenerative disc disease), lumbar 08/19/2014   and facet arthroplasty & left lumbar radiculopathy (Dr.Ramos)   Depression    Dyssynergia    dyssynergenic defecation, contributing to fecal incontinence.   Edema    Fibromyalgia    Gastroparesis    followed at [facility]   GERD (gastroesophageal reflux disease)    History of kidney stones    History of vertebral fracture 06/30/2021    Hyperlipidemia    Kidney stone    Lumbar radiculopathy    Migraine    Neuropathy    Obstructive sleep apnea    Does  wear  CPAP   Osteoporosis    Paresthesia    Dr. Blain Bulls at [facility]   Pelvic floor dysfunction    pelvic floor dyssynergy   Plantar fasciitis 02/2011   R foot   Pneumonia    2012   PONV (postoperative nausea and vomiting)    pt states has gastroparesis has difficulty taking antibiotics and narcotics has severe nausea and vomiting    Restless leg syndrome    S/P endometrial ablation 08/09/2006   Novasure Ablation   S/P epidural steroid injection 09/20/2014   Dr.Ramos   Tremor    Dr. Blain Bulls   Urinary frequency    Urinary incontinence     Family History  Problem Relation Age of Onset   Allergies Mother    Hypertension Mother    Heart disease Mother        possible valve problem - leaking valve   Macular degeneration Mother    Lung cancer Mother    Macular  degeneration Father    Heart disease Father        pacemaker, CHF   Hypertension Father    Diabetes Father        borderline   Stroke Father 42   Kidney disease Father    Allergies Sister    Asthma Sister    Irritable bowel syndrome Sister    Macular degeneration Sister    Cancer Maternal Aunt        leukemia   Cancer Maternal Aunt    Colon cancer Maternal Aunt        late 60's   Heart disease Maternal Grandmother    Heart disease Maternal Grandfather    Heart disease Paternal Grandmother    Heart disease Paternal Grandfather    CAD Neg Hx    Past Surgical History:  Procedure Laterality Date   CHOLECYSTECTOMY  07/2004   ENDOMETRIAL ABLATION  08/09/2006   Dr. Ali Antonio Ablation   FACET JOINT INJECTION  04/17/2017   Left L4-5 and L5-S1   FOOT SURGERY Right 06/29/2022   FUSION OF JOINT IN BIG TOE RT FOOT, Dr. Michalene Agee   GASTRIC ROUX-EN-Y N/A 09/29/2019   Procedure: LAPAROSCOPIC ROUX-EN-Y GASTRIC BYPASS WITH UPPER ENDOSCOPY, ERAS Pathway;  Surgeon: Jacolyn Matar, MD;   Location: [facility];  Service: General;  Laterality: N/A;   IRRIGATION AND DEBRIDEMENT FOOT Right 08/03/2022   Procedure: foot bone biopsy and hardware removal;  Surgeon: Floyce Hutching, DPM;  Location: [facility];  Service: Podiatry;  Laterality: Right;   KNEE ARTHROPLASTY     KNEE SURGERY  1999   R knee, Dr. Donavon Fudge, torn cartilage   KYPHOPLASTY  11/06/2021   [id number]   RETINAL LASER PROCEDURE Right 08/28/2018   laser retinopexy   RIGHT/LEFT HEART CATH AND CORONARY ANGIOGRAPHY N/A 01/01/2018   Procedure: RIGHT/LEFT HEART CATH AND CORONARY ANGIOGRAPHY;  Surgeon: Arnoldo Lapping, MD;  Location: [facility] INVASIVE CV LAB;  Service: Cardiovascular;  Laterality: N/A;   TONSILLECTOMY  1968   TONSILLECTOMY     TOTAL KNEE ARTHROPLASTY Right 09/05/2015   Procedure: RIGHT TOTAL KNEE ARTHROPLASTY;  Surgeon: Liliane Rei, MD;  Location: [facility];  Service: Orthopedics;  Laterality: Right;   TOTAL KNEE ARTHROPLASTY Left 07/14/2018   Procedure: LEFT TOTAL KNEE ARTHROPLASTY;  Surgeon: Liliane Rei, MD;  Location: [facility];  Service: Orthopedics;  Laterality: Left;   ULTRASOUND GUIDANCE FOR VASCULAR ACCESS  01/01/2018   Procedure: Ultrasound Guidance For Vascular Access;  Surgeon: Arnoldo Lapping, MD;  Location: [facility] INVASIVE CV LAB;  Service: Cardiovascular;;   UMBILICAL HERNIA REPAIR N/A 09/29/2019   Procedure: HERNIA REPAIR UMBILICAL ADULT;  Surgeon: Jacolyn Matar, MD;  Location: [facility];  Service: General;  Laterality: N/A;   Social History   Social History Narrative   Married, 1 son in [place] (grandson born 06/2017), 1 stepson in [place], with 2 children      Updated 07/2023   Immunization History  Administered Date(s) Administered   Fluad Quad(high Dose 65+) 09/06/2022   Fluad Trivalent(High Dose 65+) 08/28/2023   Hepatitis A, Adult 01/08/2024   Hepb-cpg 01/08/2024, 02/07/2024   Influenza Inj Mdck Quad Pf 09/13/2019   Influenza Split 09/17/2011, 09/03/2012, 09/04/2013   Influenza,inj,Quad PF,6+ Mos  08/16/2014, 07/27/2015, 07/31/2017, 08/13/2018, 07/28/2020   Influenza-Unspecified 10/04/2016, 07/31/2017, 09/23/2017, 09/18/2021   PFIZER Comirnaty(Gray Top)Covid-19 Tri-Sucrose Vaccine 06/01/2021   PFIZER(Purple Top)SARS-COV-2 Vaccination 02/20/2020, 03/12/2020, 09/20/2020   PNEUMOCOCCAL CONJUGATE-20 07/26/2022   Pfizer Covid-19 Vaccine Bivalent Booster 26yrs & up 02/05/2022   Pfizer(Comirnaty)Fall Seasonal Vaccine 12 years  and older 01/31/2023   Tdap 01/16/2011, 06/20/2017   Zoster Recombinant(Shingrix) 02/05/2019, 07/14/2019     Objective: Vital Signs: BP 120/75 (BP Location: Left Arm, Patient Position: Sitting, Cuff Size: Normal)   Pulse 69   Resp 16   Ht 5' 2.5" (1.588 m)   Wt 199 lb 12.8 oz (90.6 kg)   LMP 07/27/2006   BMI 35.96 kg/m    Physical Exam Vitals and nursing note reviewed.  Constitutional:      Appearance: She is well-developed.  HENT:     Head: Normocephalic and atraumatic.  Eyes:     Conjunctiva/sclera: Conjunctivae normal.  Cardiovascular:     Rate and Rhythm: Normal rate and regular rhythm.     Heart sounds: Normal heart sounds.  Pulmonary:     Effort: Pulmonary effort is normal.     Breath sounds: Normal breath sounds.  Abdominal:     General: Bowel sounds are normal.     Palpations: Abdomen is soft.  Musculoskeletal:     Cervical back: Normal range of motion.  Lymphadenopathy:     Cervical: No cervical adenopathy.  Skin:    General: Skin is warm and dry.     Capillary Refill: Capillary refill takes less than 2 seconds.  Neurological:     Mental Status: She is alert and oriented to person, place, and time.  Psychiatric:        Behavior: Behavior normal.      Musculoskeletal Exam: She had limited lateral motion of the cervical spine without discomfort.  She had bilateral trapezius spasm which was not very painful.  Shoulder joints, elbow joints, wrist joints, MCPs PIPs and DIPs were in good range of motion.  There was no synovitis over MCPs or  wrist joints.  Bilateral PIP and DIP thickening was noted.  Hip joints and knee joints were in good range of motion without any warmth swelling or effusion.  There was no tenderness over ankles or MTPs.  CDAI Exam: CDAI Score: -- Patient Global: --; Provider Global: -- Swollen: --; Tender: -- Joint Exam 04/21/2024   No joint exam has been documented for this visit   There is currently no information documented on the homunculus. Go to the Rheumatology activity and complete the homunculus joint exam.  Investigation: No additional findings.  Imaging: No results found.  Recent Labs: Lab Results  Component Value Date   WBC 6.9 06/17/2023   HGB 12.4 06/17/2023   PLT 316 06/17/2023   NA 143 03/11/2024   K 5.0 03/11/2024   CL 103 03/11/2024   CO2 26 03/11/2024   GLUCOSE 105 (H) 03/11/2024   BUN 15 03/11/2024   CREATININE 0.87 03/11/2024   BILITOT <0.2 03/11/2024   ALKPHOS 103 03/11/2024   AST 26 03/11/2024   ALT 20 03/11/2024   PROT 6.1 03/11/2024   ALBUMIN 3.8 (L) 03/11/2024   CALCIUM  8.9 03/11/2024   GFRAA 81 11/07/2020    Speciality Comments: Forteo  started 08/07/21 (first dose in clinic)  Procedures:  No procedures performed Allergies: Erythromycin, Meperidine  hcl, Polyethyl glyc-propyl glyc pf, Polyethylene glycol 3350 , Zolpidem , and Dilaudid  [hydromorphone  hcl]   Assessment / Plan:     Visit Diagnoses: Age-related osteoporosis without current pathological fracture - DEXA updated on 07/02/23: The BMD measured at Left Forearm Radius 33% is 0.713 g/cm2 with a T-score of -1.9.  Patient had IV Reclast  infusion on September 09, 2023.  She had some arthralgias for 24 hours and then resolved.  She denies any side effects.  She  has been taking calcium  and vitamin D .  She has not been exercising on a regular basis.  Need for regular exercise was emphasized.  We plan to give IV Reclast  again in October or November 2025.  Will have repeat DEXA scan in August 2026.  History of  vertebral compression fracture - Completed 2 years of Forteo . History of gastric bypass.  Completed 2 years of Forteo -completed early fall 2024.  Medication monitoring encounter - Patient initiated IV Reclast  on 09/09/2023.  Vitamin D  deficiency-vitamin D  was normal on June 17, 2023 at 71.  Idiopathic chronic gout of multiple sites without tophus -patient denies having a gout flare.  Allopurinol  300 mg 1 tablet by mouth daily prescribed by her PCP. uric acid: 5.5 on 03/11/2024.  Elevated LFTs - AST and ALT were within normal limits on 09/25/2023.  Patient states that she had GI workup which was negative.  Primary osteoarthritis of both hands-she had lateral PIP and DIP thickening.  Joint protection was discussed.  Primary osteoarthritis of both hips-she continue to have some discomfort in her hip joints.  Hip joints were in good range of motion.  History of total knee replacement, bilateral - 2016, 2019 by Dr. Rossie Coon.  Primary osteoarthritis of both feet-proper fitting shoes were advised.  Achilles tendinitis, right leg-no recurrence.  DDD (degenerative disc disease), cervical-she continues to have some stiffness in her neck and bilateral trapezius spasm.  Currently not very symptomatic.  Degeneration of intervertebral disc of lumbar region without discogenic back pain or lower extremity pain -she is followed by Dr. Mildred All.  Patient states that she gets frequent injections.  Fibromyalgia-she continues to have generalized pain and discomfort from fibromyalgia.  She also had positive tender points.  She states she has chronic insomnia despite taking trazodone .  She is unable to taper armodafinil .  I advised her to try tapering armodafinil  if possible.  Need for regular exercise and good sleep hygiene was emphasized.  Other fatigue-prescription refill for armodafinil  was given today.  Trapezius muscle spasm-doing better with minimal tenderness today.  Other medical problems are listed as  follows:  History of osteomyelitis - Right great toe-History of osteomyelitis.  Completed IV antibiotics on 09/14/22. Under care of Dr Gillian Lacrosse.  Gastroesophageal reflux disease without esophagitis  History of migraine  History of sleep apnea  History of depression  Orders: No orders of the defined types were placed in this encounter.  Meds ordered this encounter  Medications   Armodafinil  250 MG tablet    Sig: Take 1 tablet (250 mg total) by mouth daily as needed.    Dispense:  30 tablet    Refill:  0     Follow-Up Instructions: Return in about 5 months (around 09/21/2024) for Osteoporosis, Osteoarthritis.   Nicholas Bari, MD  Note - This record has been created using Animal nutritionist.  Chart creation errors have been sought, but may not always  have been located. Such creation errors do not reflect on  the standard of medical care.

## 2024-04-08 ENCOUNTER — Other Ambulatory Visit: Payer: Self-pay | Admitting: Nurse Practitioner

## 2024-04-08 DIAGNOSIS — Z1231 Encounter for screening mammogram for malignant neoplasm of breast: Secondary | ICD-10-CM

## 2024-04-14 ENCOUNTER — Ambulatory Visit

## 2024-04-14 DIAGNOSIS — M5416 Radiculopathy, lumbar region: Secondary | ICD-10-CM | POA: Diagnosis not present

## 2024-04-15 ENCOUNTER — Ambulatory Visit
Admission: RE | Admit: 2024-04-15 | Discharge: 2024-04-15 | Disposition: A | Discharge status: Home / Self Care | Source: Ambulatory Visit | Attending: Nurse Practitioner | Admitting: Nurse Practitioner

## 2024-04-15 DIAGNOSIS — Z1231 Encounter for screening mammogram for malignant neoplasm of breast: Secondary | ICD-10-CM | POA: Diagnosis not present

## 2024-04-21 ENCOUNTER — Encounter: Payer: Self-pay | Admitting: Rheumatology

## 2024-04-21 ENCOUNTER — Ambulatory Visit: Payer: Medicare Other | Attending: Rheumatology | Admitting: Rheumatology

## 2024-04-21 VITALS — BP 120/75 | HR 69 | Resp 16 | Ht 62.5 in | Wt 199.8 lb

## 2024-04-21 DIAGNOSIS — M19042 Primary osteoarthritis, left hand: Secondary | ICD-10-CM

## 2024-04-21 DIAGNOSIS — E559 Vitamin D deficiency, unspecified: Secondary | ICD-10-CM

## 2024-04-21 DIAGNOSIS — M1A09X0 Idiopathic chronic gout, multiple sites, without tophus (tophi): Secondary | ICD-10-CM | POA: Diagnosis not present

## 2024-04-21 DIAGNOSIS — R5383 Other fatigue: Secondary | ICD-10-CM

## 2024-04-21 DIAGNOSIS — M797 Fibromyalgia: Secondary | ICD-10-CM

## 2024-04-21 DIAGNOSIS — Z8739 Personal history of other diseases of the musculoskeletal system and connective tissue: Secondary | ICD-10-CM

## 2024-04-21 DIAGNOSIS — Z8669 Personal history of other diseases of the nervous system and sense organs: Secondary | ICD-10-CM

## 2024-04-21 DIAGNOSIS — M810 Age-related osteoporosis without current pathological fracture: Secondary | ICD-10-CM | POA: Diagnosis not present

## 2024-04-21 DIAGNOSIS — M7661 Achilles tendinitis, right leg: Secondary | ICD-10-CM

## 2024-04-21 DIAGNOSIS — M51369 Other intervertebral disc degeneration, lumbar region without mention of lumbar back pain or lower extremity pain: Secondary | ICD-10-CM

## 2024-04-21 DIAGNOSIS — Z5181 Encounter for therapeutic drug level monitoring: Secondary | ICD-10-CM

## 2024-04-21 DIAGNOSIS — Z8781 Personal history of (healed) traumatic fracture: Secondary | ICD-10-CM

## 2024-04-21 DIAGNOSIS — M19071 Primary osteoarthritis, right ankle and foot: Secondary | ICD-10-CM

## 2024-04-21 DIAGNOSIS — K219 Gastro-esophageal reflux disease without esophagitis: Secondary | ICD-10-CM

## 2024-04-21 DIAGNOSIS — M19041 Primary osteoarthritis, right hand: Secondary | ICD-10-CM

## 2024-04-21 DIAGNOSIS — M160 Bilateral primary osteoarthritis of hip: Secondary | ICD-10-CM

## 2024-04-21 DIAGNOSIS — M5030 Other cervical disc degeneration, unspecified cervical region: Secondary | ICD-10-CM

## 2024-04-21 DIAGNOSIS — Z96653 Presence of artificial knee joint, bilateral: Secondary | ICD-10-CM

## 2024-04-21 DIAGNOSIS — R7989 Other specified abnormal findings of blood chemistry: Secondary | ICD-10-CM

## 2024-04-21 DIAGNOSIS — M19072 Primary osteoarthritis, left ankle and foot: Secondary | ICD-10-CM

## 2024-04-21 DIAGNOSIS — Z8659 Personal history of other mental and behavioral disorders: Secondary | ICD-10-CM

## 2024-04-21 DIAGNOSIS — M62838 Other muscle spasm: Secondary | ICD-10-CM

## 2024-04-21 MED ORDER — ARMODAFINIL 250 MG PO TABS: 250.0000 mg | ORAL_TABLET | Freq: Every day | ORAL | 0 refills | Status: DC | PRN

## 2024-04-23 ENCOUNTER — Other Ambulatory Visit: Payer: Self-pay | Admitting: Adult Health

## 2024-04-23 DIAGNOSIS — G4700 Insomnia, unspecified: Secondary | ICD-10-CM

## 2024-04-27 ENCOUNTER — Other Ambulatory Visit: Payer: Self-pay | Admitting: Rheumatology

## 2024-04-27 NOTE — Telephone Encounter (Signed)
 Last Fill: 12/27/2023  Next Visit: 09/07/2024  Last Visit: 04/21/2024  Dx: not mentioned  Current Dose per office note on 04/21/2024: not mentioned  Okay to refill Methocarbamol ?

## 2024-05-07 DIAGNOSIS — M25512 Pain in left shoulder: Secondary | ICD-10-CM | POA: Diagnosis not present

## 2024-05-13 DIAGNOSIS — G43009 Migraine without aura, not intractable, without status migrainosus: Secondary | ICD-10-CM | POA: Diagnosis not present

## 2024-05-14 ENCOUNTER — Encounter: Payer: Self-pay | Admitting: Adult Health

## 2024-05-14 ENCOUNTER — Ambulatory Visit: Admitting: Adult Health

## 2024-05-14 DIAGNOSIS — F411 Generalized anxiety disorder: Secondary | ICD-10-CM | POA: Diagnosis not present

## 2024-05-14 DIAGNOSIS — F331 Major depressive disorder, recurrent, moderate: Secondary | ICD-10-CM

## 2024-05-14 DIAGNOSIS — G4700 Insomnia, unspecified: Secondary | ICD-10-CM | POA: Diagnosis not present

## 2024-05-14 MED ORDER — ARIPIPRAZOLE 5 MG PO TABS: 5.0000 mg | ORAL_TABLET | Freq: Every day | ORAL | 1 refills | Status: AC

## 2024-05-14 MED ORDER — DULOXETINE HCL 60 MG PO CPEP: 60.0000 mg | ORAL_CAPSULE | Freq: Two times a day (BID) | ORAL | 1 refills | Status: AC

## 2024-05-14 MED ORDER — TRAZODONE HCL 50 MG PO TABS: 50.0000 mg | ORAL_TABLET | Freq: Every day | ORAL | 1 refills | Status: DC

## 2024-05-14 NOTE — Progress Notes (Signed)
 [name] [id number] [birth year] 68 y.o.  Subjective:   Patient ID:  [name] is a 68 y.o. (DOB [birth year]) female.  Chief Complaint: No chief complaint on file.   HPI OLIVIANNA HIGLEY presents to the office today for follow-up of anxiety, depression, and insomnia.  Describes mood today as ok. Pleasant. Tearful at times. Mood symptoms - reports some situational depression, anxiety and irritability. Reports lower interest and motivation. Denies worry, rumination and over thinking. Reports situational stressors - mother's health issues. Reports mood is variable. Stating overall, I feel like I'm doing ok. Feels like medications are helpful. Taking medications as prescribed.  Energy levels lower - feels fatigued. Active, does not have a regular exercise. Enjoys some usual interests and activities. Married. Lives with husband. Has 2 sons - one local and another one in [place]. Mother local - 21 years old. Spending time with family. Appetite adequate. Weight loss. Sleeps better some nights than others. Averages 5 hours. Denies daytime napping. Using CPAP machine. Focus and concentration stable. Completing tasks. Managing aspects of household. Retired. Denies SI or HI.  Denies AH or VH Denies self harm. Denies substance use.  Bariatric surgery 11/20.   PHQ2-9    Flowsheet Row Office Visit from 08/28/2023 in [facility] Most recent reading at 08/28/2023  3:10 PM Office Visit from 11/05/2022 in [facility] - A Dept Of [place]. [facility] Most recent reading at 11/05/2022  9:32 AM Office Visit from 09/25/2022 in [facility] - A Dept Of [place]. [facility] Most recent reading at 09/25/2022  9:38 AM Office Visit from 07/26/2022 in [facility] Most recent reading at 07/26/2022  2:41 PM Video Visit from 07/26/2022 in [facility] - A Dept Of [place]. [facility] Most recent  reading at 07/26/2022  9:13 AM  PHQ-2 Total Score 0 1 0 2 1  PHQ-9 Total Score 4 -- -- 9 --   Flowsheet Row IV Medication 120 from 09/09/2023 in [facility] ED to Hosp-Admission (Discharged) from 07/30/2022 in [facility] ED from 12/31/2021 in [facility] Department at [facility]  C-SSRS RISK CATEGORY No Risk No Risk No Risk     Review of Systems:  Review of Systems  Musculoskeletal:  Negative for gait problem.  Neurological:  Negative for tremors.  Psychiatric/Behavioral:         Please refer to HPI    Medications: I have reviewed the patient's current medications.  Current Outpatient Medications  Medication Sig Dispense Refill   acetaminophen  (TYLENOL ) 500 MG tablet Take 1,000 mg by mouth every 6 (six) hours as needed.     albuterol  (VENTOLIN  HFA) 108 (90 Base) MCG/ACT inhaler TAKE 2 PUFFS BY MOUTH EVERY 6 HOURS AS NEEDED FOR WHEEZE OR SHORTNESS OF BREATH 8.5 each 0   allopurinol  (ZYLOPRIM ) 300 MG tablet Take 1 tablet (300 mg total) by mouth daily. 90 tablet 3   ALPRAZolam  (XANAX ) 0.25 MG tablet Take 1 tablet (0.25 mg total) by mouth 3 (three) times daily as needed for anxiety. 90 tablet 2   ARIPiprazole  (ABILIFY ) 5 MG tablet Take 1 tablet (5 mg total) by mouth daily. 90 tablet 1   Armodafinil  250 MG tablet Take 1 tablet (250 mg total) by mouth daily as needed. 30 tablet 0   aspirin  EC 81 MG tablet Take 81 mg by mouth in the morning.  Swallow whole.     atorvastatin  (LIPITOR) 40 MG tablet Take 1 tablet (40 mg total) by mouth daily. 90 tablet 3   Azelastine HCl (ASTEPRO NA) Place 1 spray into the nose 2 (two) times daily.     CALCIUM  PO Take 500 mg by mouth in the morning, at noon, and at bedtime.     colchicine  0.6 MG tablet Take 1 tablet (0.6 mg total) by mouth as needed. 30 tablet 0   DULoxetine  (CYMBALTA ) 60 MG capsule TAKE 1 CAPSULE BY MOUTH 2 TIMES DAILY. 180 capsule 3   fluticasone  (FLONASE ) 50 MCG/ACT nasal spray  SPRAY 2 SPRAYS INTO EACH NOSTRIL EVERY DAY 48 mL 0   gabapentin  (NEURONTIN ) 300 MG capsule Take 300 mg by mouth daily.     loratadine  (CLARITIN ) 10 MG tablet Take 10 mg by mouth daily.     meclizine  (ANTIVERT ) 25 MG tablet Take 1 tablet (25 mg total) by mouth 3 (three) times daily as needed for dizziness. 30 tablet 0   methocarbamol  (ROBAXIN ) 500 MG tablet TAKE 1 TABLET BY MOUTH EVERY DAY AS NEEDED 30 tablet 2   metoprolol  tartrate (LOPRESSOR ) 50 MG tablet Take 1 tablet (50 mg total) by mouth 2 (two) times daily. 180 tablet 1   Multiple Vitamins-Minerals (PRESERVISION AREDS 2) CAPS Take 1 capsule by mouth in the morning and at bedtime.     ondansetron  (ZOFRAN ) 8 MG tablet Take 8 mg by mouth every 8 (eight) hours as needed for nausea.  0   OXcarbazepine  (TRILEPTAL ) 150 MG tablet Take 150 mg by mouth in the morning and at bedtime.     pantoprazole  (PROTONIX ) 40 MG tablet Take 1 tablet (40 mg total) by mouth daily. (Patient taking differently: Take 40 mg by mouth at bedtime.) 90 tablet 0   PRESCRIPTION MEDICATION CPAP- At bedtime     Probiotic Product (PROBIOTIC ADVANCED PO) Take 1 capsule by mouth at bedtime.     promethazine  (PHENERGAN ) 25 MG tablet Take 25 mg by mouth every 6 (six) hours as needed for nausea or vomiting.     RESTASIS  0.05 % ophthalmic emulsion Place 1 drop into both eyes in the morning and at bedtime.     SYSTANE ULTRA PF 0.4-0.3 % SOLN Place 1 drop into both eyes 3 (three) times daily as needed (for dryness).     traZODone  (DESYREL ) 50 MG tablet Take 1 tablet (50 mg total) by mouth at bedtime. 90 tablet 1   Zoledronic  Acid (RECLAST  IV) Infusion: 09/09/2023     No current facility-administered medications for this visit.    Medication Side Effects: None  Allergies:  Allergies  Allergen Reactions   Erythromycin Nausea Only and Other (See Comments)    Abdominal pain, also   Meperidine  Hcl Other (See Comments)    Demerol - Made the patient feel hot and her heart raced- made  her feel flushed, also   Polyethyl Glyc-Propyl Glyc Pf Nausea And Vomiting and Other (See Comments)    RN confirmed with patient 9/4 (Miralax )   Polyethylene Glycol 3350  Other (See Comments)    Other Reaction(s): GI Intolerance  RN confirmed with patient 9/4 (Miralax )   Zolpidem  Other (See Comments)    Lost balance   Dilaudid  [Hydromorphone  Hcl] Itching    Past Medical History:  Diagnosis Date   Anemia    previously followed by Dr. Nonah Bayley for anemia and elevated platelets   Anxiety    C. difficile colitis 10/01/2012   treated by [facility]   Chronic  fatigue syndrome    Closed wedge compression fracture of T8 vertebra (HCC) 06/2021   DDD (degenerative disc disease), lumbar 08/19/2014   and facet arthroplasty & left lumbar radiculopathy (Dr.Ramos)   Depression    Dyssynergia    dyssynergenic defecation, contributing to fecal incontinence.   Edema    Fibromyalgia    Gastroparesis    follow[facility] at [facility]   GERD (gastroesophageal reflux disease)    History of kidney stones    History of vertebral fracture 06/30/2021   Hyperlipidemia    Kidney stone    Lumbar radiculopathy    Migraine    Neuropathy    Obstructive sleep apnea    Does  wear  CPAP   Osteoporosis    Paresthesia    Dr. Blain Bulls at [facility]   Pelvic floor dysfunction    pelvic floor dyssynergy   Plantar fasciitis 02/2011   R foot   Pneumonia    2012   PONV (postoperative nausea and vomiting)    pt states has gastroparesis has difficulty taking antibiotics and narcotics has severe nausea and vomiting    Restless leg syndrome    S/P endometrial ablation 08/09/2006   Novasure Ablation   S/P epidural steroid injection 09/20/2014   Dr.Ramos   Tremor    Dr. Blain Bulls   Urinary frequency    Urinary incontinence     Past Medical History, Surgical history, Social history, and Family history were reviewed and updated as appropriate.   Please see review of systems for further details on the patient's review  from today.   Objective:   Physical Exam:  LMP 07/27/2006   Physical Exam Constitutional:      General: She is not in acute distress.  Musculoskeletal:        General: No deformity.   Neurological:     Mental Status: She is alert and oriented to person, place, and time.     Coordination: Coordination normal.   Psychiatric:        Attention and Perception: Attention and perception normal. She does not perceive auditory or visual hallucinations.        Mood and Affect: Mood normal. Mood is not anxious or depressed. Affect is not labile, blunt, angry or inappropriate.        Speech: Speech normal.        Behavior: Behavior normal.        Thought Content: Thought content normal. Thought content is not paranoid or delusional. Thought content does not include homicidal or suicidal ideation. Thought content does not include homicidal or suicidal plan.        Cognition and Memory: Cognition and memory normal.        Judgment: Judgment normal.     Comments: Insight intact     Lab Review:     Component Value Date/Time   NA 143 03/11/2024 1022   K 5.0 03/11/2024 1022   CL 103 03/11/2024 1022   CO2 26 03/11/2024 1022   GLUCOSE 105 (H) 03/11/2024 1022   GLUCOSE 114 (H) 06/17/2023 0955   BUN 15 03/11/2024 1022   CREATININE 0.87 03/11/2024 1022   CREATININE 0.83 06/17/2023 0955   CALCIUM  8.9 03/11/2024 1022   PROT 6.1 03/11/2024 1022   ALBUMIN 3.8 (L) 03/11/2024 1022   AST 26 03/11/2024 1022   ALT 20 03/11/2024 1022   ALKPHOS 103 03/11/2024 1022   BILITOT <0.2 03/11/2024 1022   GFRNONAA >60 08/05/2022 0326   GFRNONAA 68 07/04/2020 1129   GFRAA  81 11/07/2020 1029   GFRAA 79 07/04/2020 1129       Component Value Date/Time   WBC 6.9 06/17/2023 0955   RBC 4.26 06/17/2023 0955   HGB 12.4 06/17/2023 0955   HGB 12.5 07/17/2022 1614   HGB 11.7 05/02/2011 1412   HCT 37.5 06/17/2023 0955   HCT 38.0 07/17/2022 1614   HCT 37.1 05/02/2011 1412   PLT 316 06/17/2023 0955   PLT 431  07/17/2022 1614   MCV 88.0 06/17/2023 0955   MCV 91 07/17/2022 1614   MCV 94.4 05/02/2011 1412   MCH 29.1 06/17/2023 0955   MCHC 33.1 06/17/2023 0955   RDW 14.3 06/17/2023 0955   RDW 13.2 07/17/2022 1614   RDW 18.3 (H) 05/02/2011 1412   LYMPHSABS 1,967 06/17/2023 0955   LYMPHSABS 3.2 (H) 07/17/2022 1614   LYMPHSABS 2.4 05/02/2011 1412   MONOABS 0.9 08/02/2022 0341   MONOABS 0.8 05/02/2011 1412   EOSABS 186 06/17/2023 0955   EOSABS 0.3 07/17/2022 1614   BASOSABS 83 06/17/2023 0955   BASOSABS 0.1 07/17/2022 1614   BASOSABS 0.1 05/02/2011 1412    No results found for: POCLITH, LITHIUM   No results found for: PHENYTOIN, PHENOBARB, VALPROATE, CBMZ   .res Assessment: Plan:    Plan:  Xanax  0.25mg  TID Cymbalta  60mg  BID Abilify  5mg  daily Trazadone 50mg  at bedtime.  RTC 6 months  20 minutes spent dedicated to the care of this patient on the date of this encounter to include pre-visit review of records, ordering of medication, post visit documentation, and face-to-face time with the patient discussing depression, anxiety, and insomnia. Discussed continuing current medication regimen.  Patient advised to contact office with any questions, adverse effects, or acute worsening in signs and symptoms.  Discussed potential metabolic side effects associated with atypical antipsychotics, as well as potential risk for movement side effects. Advised pt to contact office if movement side effects occur.   Discussed potential benefits, risk, and side effects of benzodiazepines to include potential risk of tolerance and dependence, as well as possible drowsiness.  Advised patient not to drive if experiencing drowsiness and to take lowest possible effective dose to minimize risk of dependence and tolerance.   There are no diagnoses linked to this encounter.   Please see After Visit Summary for patient specific instructions.  Future Appointments  Date Time Provider Department  Center  07/08/2024 10:45 AM [facility]IN[facility]  09/07/2024  9:50 AM Romayne Clubs, PA-C [facility] None  09/17/2024  2:00 PM Roosvelt Colla, MD [facility]    No orders of the defined types were placed in this encounter.   -------------------------------

## 2024-05-19 DIAGNOSIS — M25512 Pain in left shoulder: Secondary | ICD-10-CM | POA: Diagnosis not present

## 2024-06-01 ENCOUNTER — Other Ambulatory Visit: Payer: Self-pay | Admitting: Rheumatology

## 2024-06-01 DIAGNOSIS — R5383 Other fatigue: Secondary | ICD-10-CM

## 2024-06-01 DIAGNOSIS — M797 Fibromyalgia: Secondary | ICD-10-CM

## 2024-06-01 NOTE — Telephone Encounter (Signed)
 Last Fill: 04/21/2024  Next Visit: 09/07/2024  Last Visit: 04/21/2024  Dx: Age-related osteoporosis without current pathological fracture   Current Dose per office note on 04/21/2024: Armodafinil  250 MG tablet   Okay to refill Armodafinil ?

## 2024-06-02 ENCOUNTER — Other Ambulatory Visit: Payer: Self-pay | Admitting: Rheumatology

## 2024-06-02 ENCOUNTER — Telehealth: Payer: Self-pay | Admitting: Rheumatology

## 2024-06-02 DIAGNOSIS — R5383 Other fatigue: Secondary | ICD-10-CM

## 2024-06-02 DIAGNOSIS — M797 Fibromyalgia: Secondary | ICD-10-CM

## 2024-06-02 NOTE — Telephone Encounter (Signed)
 Patient contacted the office to request a medication refill.   1. Name of Medication: Armodafinil   2. How are you currently taking this medication (dosage and times per day)? Pt takes half a pill  a day    3. What pharmacy would you like for that to be sent to? [facility]- 

## 2024-06-02 NOTE — Telephone Encounter (Signed)
 Patient advised prescription was sent to the pharmacy on 06/01/2024. Patient expressed understanding.

## 2024-06-04 ENCOUNTER — Other Ambulatory Visit: Payer: Self-pay | Admitting: Adult Health

## 2024-06-04 DIAGNOSIS — G4700 Insomnia, unspecified: Secondary | ICD-10-CM

## 2024-06-08 DIAGNOSIS — M79674 Pain in right toe(s): Secondary | ICD-10-CM | POA: Diagnosis not present

## 2024-06-11 DIAGNOSIS — M75120 Complete rotator cuff tear or rupture of unspecified shoulder, not specified as traumatic: Secondary | ICD-10-CM | POA: Diagnosis not present

## 2024-06-15 DIAGNOSIS — D1801 Hemangioma of skin and subcutaneous tissue: Secondary | ICD-10-CM | POA: Diagnosis not present

## 2024-06-23 DIAGNOSIS — M7542 Impingement syndrome of left shoulder: Secondary | ICD-10-CM | POA: Diagnosis not present

## 2024-06-30 DIAGNOSIS — H16142 Punctate keratitis, left eye: Secondary | ICD-10-CM | POA: Diagnosis not present

## 2024-07-01 ENCOUNTER — Other Ambulatory Visit: Payer: Self-pay | Admitting: Rheumatology

## 2024-07-01 DIAGNOSIS — M797 Fibromyalgia: Secondary | ICD-10-CM

## 2024-07-01 DIAGNOSIS — R5383 Other fatigue: Secondary | ICD-10-CM

## 2024-07-01 NOTE — Telephone Encounter (Signed)
 Last Fill: 06/01/2024  Next Visit: 09/07/2024  Last Visit : 04/21/2024  Dx: Age-related osteoporosis without current pathological fracture  Current Dose per office note on 04/21/2024:  Armodafinil  1 tablet (250 mg total) by mouth daily as needed.  Okay to refill Armodafinil ?

## 2024-07-06 DIAGNOSIS — M25571 Pain in right ankle and joints of right foot: Secondary | ICD-10-CM | POA: Diagnosis not present

## 2024-07-08 ENCOUNTER — Other Ambulatory Visit (INDEPENDENT_AMBULATORY_CARE_PROVIDER_SITE_OTHER): Payer: Medicare Other

## 2024-07-08 DIAGNOSIS — Z23 Encounter for immunization: Secondary | ICD-10-CM

## 2024-07-10 DIAGNOSIS — M25512 Pain in left shoulder: Secondary | ICD-10-CM | POA: Diagnosis not present

## 2024-07-10 DIAGNOSIS — M5416 Radiculopathy, lumbar region: Secondary | ICD-10-CM | POA: Diagnosis not present

## 2024-07-10 DIAGNOSIS — S90811A Abrasion, right foot, initial encounter: Secondary | ICD-10-CM | POA: Diagnosis not present

## 2024-07-13 DIAGNOSIS — M79671 Pain in right foot: Secondary | ICD-10-CM | POA: Diagnosis not present

## 2024-07-20 DIAGNOSIS — M19071 Primary osteoarthritis, right ankle and foot: Secondary | ICD-10-CM | POA: Diagnosis not present

## 2024-07-25 ENCOUNTER — Other Ambulatory Visit: Payer: Self-pay | Admitting: Rheumatology

## 2024-07-28 NOTE — Telephone Encounter (Signed)
 Last Fill: 04/27/2024  Next Visit: 09/07/2024  Last Visit: 04/21/2024  Dx: Fibromyalgia   Current Dose per office note on 04/21/2024: not discussed  Okay to refill Methocarbamol ?

## 2024-07-29 DIAGNOSIS — Z96651 Presence of right artificial knee joint: Secondary | ICD-10-CM | POA: Diagnosis not present

## 2024-08-03 ENCOUNTER — Other Ambulatory Visit: Payer: Self-pay | Admitting: Rheumatology

## 2024-08-03 DIAGNOSIS — M797 Fibromyalgia: Secondary | ICD-10-CM

## 2024-08-03 DIAGNOSIS — R5383 Other fatigue: Secondary | ICD-10-CM

## 2024-08-03 NOTE — Telephone Encounter (Signed)
 Last Fill: 07/01/2024  Next Visit: 09/07/2024  Last Visit: 04/21/2024  Dx: Fibromyalgia  Current Dose per office note on 04/21/2024: Dose not mentioned  Okay to refill Armodafinil ?

## 2024-08-06 DIAGNOSIS — M25661 Stiffness of right knee, not elsewhere classified: Secondary | ICD-10-CM | POA: Diagnosis not present

## 2024-08-06 DIAGNOSIS — M25561 Pain in right knee: Secondary | ICD-10-CM | POA: Diagnosis not present

## 2024-08-06 DIAGNOSIS — M6281 Muscle weakness (generalized): Secondary | ICD-10-CM | POA: Diagnosis not present

## 2024-08-12 DIAGNOSIS — L821 Other seborrheic keratosis: Secondary | ICD-10-CM | POA: Diagnosis not present

## 2024-08-12 DIAGNOSIS — S0001XA Abrasion of scalp, initial encounter: Secondary | ICD-10-CM | POA: Diagnosis not present

## 2024-08-12 DIAGNOSIS — L218 Other seborrheic dermatitis: Secondary | ICD-10-CM | POA: Diagnosis not present

## 2024-08-14 ENCOUNTER — Telehealth: Payer: Self-pay | Admitting: Pharmacist

## 2024-08-14 NOTE — Telephone Encounter (Signed)
 Patient due for Reclast  on 09/08/2024. Will need updated labs at [facility] on 09/07/2024  Referral will be placed after labs results  Sherry Pennant, PharmD, MPH, BCPS, CPP Clinical Pharmacist [facility])

## 2024-08-20 ENCOUNTER — Other Ambulatory Visit: Payer: Self-pay | Admitting: Adult Health

## 2024-08-20 DIAGNOSIS — M6281 Muscle weakness (generalized): Secondary | ICD-10-CM | POA: Diagnosis not present

## 2024-08-20 DIAGNOSIS — M25561 Pain in right knee: Secondary | ICD-10-CM | POA: Diagnosis not present

## 2024-08-20 DIAGNOSIS — G4700 Insomnia, unspecified: Secondary | ICD-10-CM

## 2024-08-20 DIAGNOSIS — M25661 Stiffness of right knee, not elsewhere classified: Secondary | ICD-10-CM | POA: Diagnosis not present

## 2024-08-21 ENCOUNTER — Telehealth: Payer: Self-pay

## 2024-08-21 DIAGNOSIS — G4700 Insomnia, unspecified: Secondary | ICD-10-CM

## 2024-08-21 NOTE — Telephone Encounter (Signed)
 Pt lvm requesting new Rx for Alprazolam . Vacation 9/30 and need before she goes. Also, mentioned needing Trazodone  Rx. She has been taking 2 and works well. Running out sooner. [phone]

## 2024-08-21 NOTE — Telephone Encounter (Signed)
 From June visit:  Xanax  0.25mg  TID Cymbalta  60mg  BID Abilify  5mg  daily Trazadone 50mg  at bedtime.   RTC 6 months  She is taking 100 mg of trazodone  and asking for new Rx. ? If ok to continue 100 mg. Reports it is working well.

## 2024-08-24 ENCOUNTER — Ambulatory Visit: Payer: Self-pay

## 2024-08-24 ENCOUNTER — Encounter: Payer: Self-pay | Admitting: Family Medicine

## 2024-08-24 ENCOUNTER — Ambulatory Visit (INDEPENDENT_AMBULATORY_CARE_PROVIDER_SITE_OTHER): Admitting: Family Medicine

## 2024-08-24 VITALS — BP 130/74 | HR 80 | Temp 98.7°F | Ht 62.5 in | Wt 187.0 lb

## 2024-08-24 DIAGNOSIS — R519 Headache, unspecified: Secondary | ICD-10-CM | POA: Diagnosis not present

## 2024-08-24 DIAGNOSIS — J029 Acute pharyngitis, unspecified: Secondary | ICD-10-CM

## 2024-08-24 DIAGNOSIS — B349 Viral infection, unspecified: Secondary | ICD-10-CM | POA: Diagnosis not present

## 2024-08-24 DIAGNOSIS — R509 Fever, unspecified: Secondary | ICD-10-CM

## 2024-08-24 DIAGNOSIS — K120 Recurrent oral aphthae: Secondary | ICD-10-CM

## 2024-08-24 DIAGNOSIS — R52 Pain, unspecified: Secondary | ICD-10-CM

## 2024-08-24 LAB — POCT RAPID STREP A (OFFICE): Rapid Strep A Screen: NEGATIVE

## 2024-08-24 LAB — POC COVID19 BINAXNOW: SARS Coronavirus 2 Ag: NEGATIVE

## 2024-08-24 NOTE — Telephone Encounter (Signed)
 FYI Only or Action Required?: FYI only for provider.  Patient was last seen in primary care on 03/11/2024 by Randol Dawes, MD.  Called Nurse Triage reporting Oral Pain, Skin Problem, and Fever.  Symptoms began several days ago.  Interventions attempted: Other: salt water  rinses.  Symptoms are: unchanged.  Triage Disposition: See PCP When Office is Open (Within 3 Days)  Patient/caregiver understands and will follow disposition?: Yes   Message from [facility] sent at 08/24/2024  9:38 AM EDT  Summary: ache body,sensitive skin, low grade fever, roof of mouth tender, hurts to eat,   patient started feeling bad on Saturday, ache body,sensitive skin, low grade fever, roof of mouth tender, hurts to eat,      Reason for Disposition  [1] MILD-MODERATE mouth pain AND [2] present > 3 days  Answer Assessment - Initial Assessment Questions Additional info: Using salt water  rinses.    1. ONSET: When did your mouth start hurting? (e.g., hours or days ago)      Saturday 2. SEVERITY: How bad is the pain? (Scale 1-10; or mild, moderate or severe)     Hurts to eat. Feels like I burnt the roof of my mouth but I didn't 3. SORES: Are there any sores or ulcers in the mouth? If Yes, ask: What part of the mouth are the sores in?     denies 4. FEVER: Do you have a fever? If Yes, ask: What is your temperature, how was it measured, and when did it start?     08/23/24 99.6,  baseline 97.6. this morning 98.4 5. CAUSE: What do you think is causing the mouth pain?     unsure 6. OTHER SYMPTOMS: Do you have any other symptoms? (e.g., difficulty breathing)     Skin sensitive to touch, throat tender.  Protocols used: Mouth Pain-A-AH

## 2024-08-24 NOTE — Progress Notes (Unsigned)
 Office Visit Note  Patient: [name]             Date of Birth: 19-Sep-1956           MRN: [id number]             PCP: Randol Dawes, MD Referring: Randol Dawes, MD Visit Date: 09/07/2024 Occupation: Data Unavailable  Subjective:  Discuss next reclast  infusion   History of Present Illness: [name] is a 68 y.o. female  with history of fibromyalia and osteoarthritis.  Patient received IV Reclast  on 09/09/2023.  She presents today to discuss proceeding with the next IV Reclast  infusion.  Patient reports that she currently has a sore on the roof of her mouth for which she is under the care of her dentist.  Patient states that she has been using a topical oral steroid and will be following up with her dentist tomorrow afternoon for further evaluation.  Patient states that there was discussion of possibly having to refer her to oral surgery for biopsy if the sore is not healing.  She denies any other extensive dental work scheduled.   She continues to have chronic fatigue and takes armodafinil  as prescribed. She requested a refill to be sent to the pharmacy today.  She denies any signs or symptoms of a gout flare.  She remains on allopurinol  300 mg daily and takes colchicine  as needed during gout flares.    Activities of Daily Living:  Patient denies any morning stiffness   Patient Denies nocturnal pain.  Difficulty dressing/grooming: Denies Difficulty climbing stairs: Denies Difficulty getting out of chair: Denies Difficulty using hands for taps, buttons, cutlery, and/or writing: Denies  Review of Systems  Constitutional:  Positive for fatigue.  HENT:  Positive for mouth sores and mouth dryness.   Eyes:  Positive for dryness.  Cardiovascular:  Negative for chest pain and palpitations.  Gastrointestinal:  Positive for constipation and diarrhea. Negative for blood in stool.  Endocrine: Positive for increased urination.  Genitourinary:  Positive for involuntary urination.   Musculoskeletal:  Positive for gait problem, myalgias, muscle tenderness and myalgias. Negative for joint pain, joint pain, joint swelling, muscle weakness and morning stiffness.  Skin:  Negative for color change, rash, hair loss and sensitivity to sunlight.  Allergic/Immunologic: Negative for susceptible to infections.  Neurological:  Positive for headaches. Negative for dizziness.  Hematological:  Negative for swollen glands.  Psychiatric/Behavioral:  Positive for sleep disturbance. Negative for depressed mood. The patient is nervous/anxious.     PMFS History:  Patient Active Problem List   Diagnosis Date Noted   Osteoporosis, postmenopausal 09/07/2024   Skin ulcer of left great toe, limited to breakdown of skin (HCC) 09/25/2022   Medication monitoring encounter 08/29/2022   History of Clostridioides difficile colitis    Infected orthopedic implant    Charcot's arthropathy of forefoot    Right foot pain 07/31/2022   Osteomyelitis of great toe of right foot (HCC) 07/30/2022   Bipolar disorder (HCC) 07/30/2022   Osteopenia 08/04/2021   Acute right-sided weakness 01/22/2021   Peripheral polyneuropathy 01/22/2021   History of migraine headaches 01/22/2021   Difficulty with speech    Gastric bypass status for obesity 09/29/2019   Aftercare 08/12/2018   Pain in left knee 06/03/2018   Dyspnea 12/26/2017   Restrictive lung disease secondary to obesity 12/26/2017   Atypical chest pain 12/06/2017   Sinus tachycardia 12/06/2017   Chest pain 12/06/2017   DJD (degenerative joint disease), cervical 12/24/2016  Primary osteoarthritis of both hips 12/24/2016   Primary osteoarthritis of both knees 12/24/2016   H/O total knee replacement, right 12/24/2016   Spondylosis of lumbar region without myelopathy or radiculopathy 12/24/2016   Lumbosacral spondylosis without myelopathy 12/24/2016   Acute gout 05/30/2016   Gout 05/30/2016   Myalgia 03/29/2016   Other long term (current) drug  therapy 03/29/2016   Idiopathic peripheral neuropathy 03/29/2016   Cannot sleep 03/29/2016   Migraine without aura and responsive to treatment 03/29/2016   Multifocal myoclonus 03/29/2016   Restless leg 03/29/2016   Has a tremor 03/29/2016   History of aspiration pneumonitis 01/25/2016   History of acute bronchitis 01/25/2016   LPRD (laryngopharyngeal reflux disease) 01/25/2016   Imbalance 01/09/2016   Serotonin syndrome 12/22/2015   Essential hypertension 09/10/2015   OA (osteoarthritis) of knee 09/05/2015   Obesity 05/17/2015   OSA (obstructive sleep apnea) 03/16/2013   Iron deficiency 11/06/2012   Thrombocythemia 11/06/2012   Leukocytosis 11/05/2012   Impaired fasting glucose 07/23/2012   Bronchitis 04/03/2012   Kidney stone on left side 03/05/2012   Kidney cysts 03/04/2012   Loose stools 03/04/2012   Urinary frequency 12/04/2011   Restless leg syndrome 12/04/2011   Polypharmacy 12/04/2011   Bladder incontinence 12/04/2011   Fibromyalgia    Mixed hyperlipidemia    Edema    S/P endometrial ablation    Allergic rhinitis 09/18/2011   Depression, major, single episode, in partial remission (HCC) 04/21/2011   PRECORDIAL PAIN 02/17/2010   Anxiety state 01/30/2010   Migraine 01/30/2010   GERD without esophagitis 01/30/2010   Gastroparesis 01/30/2010    Past Medical History:  Diagnosis Date   Anemia    previously followed by Dr. Valjean for anemia and elevated platelets   Anxiety    C. difficile colitis 10/01/2012   treated by [facility]   Chronic fatigue syndrome    Closed wedge compression fracture of T8 vertebra (HCC) 06/2021   DDD (degenerative disc disease), lumbar 08/19/2014   and facet arthroplasty & left lumbar radiculopathy (Dr.Ramos)   Depression    Dyssynergia    dyssynergenic defecation, contributing to fecal incontinence.   Edema    Fibromyalgia    Gastroparesis    followed at [facility]   GERD (gastroesophageal reflux disease)    History of kidney stones     History of vertebral fracture 06/30/2021   Hyperlipidemia    Kidney stone    Lumbar radiculopathy    Migraine    Neuropathy    Obstructive sleep apnea    Does  wear  CPAP   Osteoporosis    Paresthesia    Dr. Charlyne at [facility]   Pelvic floor dysfunction    pelvic floor dyssynergy   Plantar fasciitis 02/2011   R foot   Pneumonia    2012   PONV (postoperative nausea and vomiting)    pt states has gastroparesis has difficulty taking antibiotics and narcotics has severe nausea and vomiting    Restless leg syndrome    S/P endometrial ablation 08/09/2006   Novasure Ablation   S/P epidural steroid injection 09/20/2014   Dr.Ramos   Tremor    Dr. Charlyne   Urinary frequency    Urinary incontinence     Family History  Problem Relation Age of Onset   Allergies Mother    Hypertension Mother    Heart disease Mother        possible valve problem - leaking valve   Macular degeneration Mother    Lung cancer  Mother    Macular degeneration Father    Heart disease Father        pacemaker, CHF   Hypertension Father    Diabetes Father        borderline   Stroke Father 14   Kidney disease Father    Allergies Sister    Asthma Sister    Irritable bowel syndrome Sister    Macular degeneration Sister    Cancer Maternal Aunt        leukemia   Cancer Maternal Aunt    Colon cancer Maternal Aunt        late 60's   Heart disease Maternal Grandmother    Heart disease Maternal Grandfather    Heart disease Paternal Grandmother    Heart disease Paternal Grandfather    CAD Neg Hx    Past Surgical History:  Procedure Laterality Date   CHOLECYSTECTOMY  07/2004   ENDOMETRIAL ABLATION  08/09/2006   Dr. Joretta Ablation   FACET JOINT INJECTION  04/17/2017   Left L4-5 and L5-S1   FOOT SURGERY Right 06/29/2022   FUSION OF JOINT IN BIG TOE RT FOOT, Dr. Silva   GASTRIC ROUX-EN-Y N/A 09/29/2019   Procedure: LAPAROSCOPIC ROUX-EN-Y GASTRIC BYPASS WITH UPPER ENDOSCOPY, ERAS  Pathway;  Surgeon: Gladis Cough, MD;  Location: [facility];  Service: General;  Laterality: N/A;   IRRIGATION AND DEBRIDEMENT FOOT Right 08/03/2022   Procedure: foot bone biopsy and hardware removal;  Surgeon: Silva Juliene SAUNDERS, DPM;  Location: [facility];  Service: Podiatry;  Laterality: Right;   KNEE ARTHROPLASTY     KNEE SURGERY  1999   R knee, Dr. Deward, torn cartilage   KYPHOPLASTY  11/06/2021   [id number]   RETINAL LASER PROCEDURE Right 08/28/2018   laser retinopexy   RIGHT/LEFT HEART CATH AND CORONARY ANGIOGRAPHY N/A 01/01/2018   Procedure: RIGHT/LEFT HEART CATH AND CORONARY ANGIOGRAPHY;  Surgeon: Wonda Sharper, MD;  Location[facility] INVASIVE CV LAB;  Service: Cardiovascular;  Laterality: N/A;   TONSILLECTOMY  1968   TONSILLECTOMY     TOTAL KNEE ARTHROPLASTY Right 09/05/2015   Procedure: RIGHT TOTAL KNEE ARTHROPLASTY;  Surgeon: Dempsey Moan, MD;  Location: [facility];  Service: Orthopedics;  Laterality: Right;   TOTAL KNEE ARTHROPLASTY Left 07/14/2018   Procedure: LEFT TOTAL KNEE ARTHROPLASTY;  Surgeon: Moan Dempsey, MD;  Location: [facility];  Service: Orthopedics;  Laterality: Left;   ULTRASOUND GUIDANCE FOR VASCULAR ACCESS  01/01/2018   Procedure: Ultrasound Guidance For Vascular Access;  Surgeon: Wonda Sharper, MD;  Location: [facility] INVASIVE CV LAB;  Service: Cardiovascular;;   UMBILICAL HERNIA REPAIR N/A 09/29/2019   Procedure: HERNIA REPAIR UMBILICAL ADULT;  Surgeon: Gladis Cough, MD;  Location: [facility];  Service: General;  Laterality: N/A;   Social History   Tobacco Use   Smoking status: Never    Passive exposure: Past   Smokeless tobacco: Never  Vaping Use   Vaping status: Never Used  Substance Use Topics   Alcohol  use: No    Alcohol /week: 0.0 standard drinks of alcohol    Drug use: No   Social History   Social History Narrative   Married, 1 son in [place] (grandson born 06/2017), 1 stepson in [place], with 2 children      Updated 07/2023     Immunization History   Administered Date(s) Administered   Fluad Quad(high Dose 65+) 09/06/2022   Fluad Trivalent(High Dose 65+) 08/28/2023   Hepatitis A, Adult 01/08/2024, 07/08/2024   Hepb-cpg 01/08/2024, 02/07/2024   Influenza Inj Mdck Quad Pf  09/13/2019   Influenza Split 09/17/2011, 09/03/2012, 09/04/2013   Influenza,inj,Quad PF,6+ Mos 08/16/2014, 07/27/2015, 07/31/2017, 08/13/2018, 07/28/2020   Influenza-Unspecified 10/04/2016, 07/31/2017, 09/23/2017, 09/18/2021   PFIZER Comirnaty(Gray Top)Covid-19 Tri-Sucrose Vaccine 06/01/2021   PFIZER(Purple Top)SARS-COV-2 Vaccination 02/20/2020, 03/12/2020, 09/20/2020   PNEUMOCOCCAL CONJUGATE-20 07/26/2022   Pfizer Covid-19 Vaccine Bivalent Booster 79yrs & up 02/05/2022   Pfizer(Comirnaty)Fall Seasonal Vaccine 12 years and older 01/31/2023   Tdap 01/16/2011, 06/20/2017   Zoster Recombinant(Shingrix) 02/05/2019, 07/14/2019     Objective: Vital Signs: BP 139/85   Pulse 67   Temp (!) 97.3 F (36.3 C)   Resp 16   Ht 5' 2.5 (1.588 m)   Wt 183 lb 9.6 oz (83.3 kg)   LMP 07/27/2006   BMI 33.05 kg/m    Physical Exam Vitals and nursing note reviewed.  Constitutional:      Appearance: She is well-developed.  HENT:     Head: Normocephalic and atraumatic.  Eyes:     Conjunctiva/sclera: Conjunctivae normal.  Cardiovascular:     Rate and Rhythm: Normal rate and regular rhythm.     Heart sounds: Normal heart sounds.  Pulmonary:     Effort: Pulmonary effort is normal.     Breath sounds: Normal breath sounds.  Abdominal:     General: Bowel sounds are normal.     Palpations: Abdomen is soft.  Musculoskeletal:     Cervical back: Normal range of motion.  Lymphadenopathy:     Cervical: No cervical adenopathy.  Skin:    General: Skin is warm and dry.     Capillary Refill: Capillary refill takes less than 2 seconds.  Neurological:     Mental Status: She is alert and oriented to person, place, and time.  Psychiatric:        Behavior: Behavior normal.       Musculoskeletal Exam: C-spine has limited range of motion.  Trapezius muscle tension and tenderness bilaterally.  Shoulder joints, elbow joints, wrist joints and MCPs, PIPs and DIPs are good range of motion with no synovitis.  PIP and DIP thickening consistent with osteoarthritis of both hands.  Hip joints have good range of motion with no groin pain.  Knee replacements have good range of motion with no warmth or effusion.  Ankle joints have good range of motion with no tenderness. Dorsal spurs noted bilaterally.   CDAI Exam: CDAI Score: -- Patient Global: --; Provider Global: -- Swollen: --; Tender: -- Joint Exam 09/07/2024   No joint exam has been documented for this visit   There is currently no information documented on the homunculus. Go to the Rheumatology activity and complete the homunculus joint exam.  Investigation: No additional findings.  Imaging: No results found.  Recent Labs: Lab Results  Component Value Date   WBC 6.9 06/17/2023   HGB 12.4 06/17/2023   PLT 316 06/17/2023   NA 143 03/11/2024   K 5.0 03/11/2024   CL 103 03/11/2024   CO2 26 03/11/2024   GLUCOSE 105 (H) 03/11/2024   BUN 15 03/11/2024   CREATININE 0.87 03/11/2024   BILITOT <0.2 03/11/2024   ALKPHOS 103 03/11/2024   AST 26 03/11/2024   ALT 20 03/11/2024   PROT 6.1 03/11/2024   ALBUMIN 3.8 (L) 03/11/2024   CALCIUM  8.9 03/11/2024   GFRAA 81 11/07/2020    Speciality Comments: Forteo  started 08/07/21 (first dose in clinic); Reclast  09/09/23  Procedures:  No procedures performed Allergies: Erythromycin, Meperidine  hcl, Polyethyl glyc-propyl glyc pf, Polyethylene glycol 3350 , Zolpidem , and Dilaudid  [hydromorphone  hcl]  Assessment / Plan:     Visit Diagnoses: Fibromyalgia -She continues to experience intermittent myalgias and muscle tenderness due to fibromyalgia.  She remains on Cymbalta  60 mg twice daily and takes methocarbamol  500 mg 1 tablet daily as needed for muscle spasms.  She takes  trazodone  50 mg 1 to 2 tablets at bedtime for insomnia.  She continues to have chronic fatigue and takes armodafinil  daily.  A refill was sent to the pharmacy as requested. She has started to go to the gym on a regular basis to improve range of motion and strength.  Discussed the importance of exercise and good sleep hygiene. She will follow up in 6 months or sooner if needed.  plan: Armodafinil  250 MG tablet  Other fatigue -Chronic, stable. She takes armodafinil  as prescribed. A refill was sent to the pharmacy today.  Plan: Armodafinil  250 MG tablet  Age-related osteoporosis without current pathological fracture: DEXA updated on 07/02/23: The BMD measured at Left Forearm Radius 33% is 0.713 g/cm2 with a T-score of -1.9.  Patient had IV Reclast  infusion on 09/09/23.  She had some arthralgias for 24-48 hours after the infusion and then resolved.  She is taking a calcium  supplement daily. Plan to update CBC, CMP, and vitamin D  today prior to scheduling next IV reclast  infusion.  Discussed the importance of performing resistive exercises.    She will have repeat DEXA scan in August 2026.  History of vertebral compression fracture:  Completed 2 years of Forteo . History of gastric bypass.  Completed 2 years of Forteo -completed early fall 2024. IV reclast  on 09/09/23. Plan to proceed with another IV infusion pending lab results.   Vitamin D  deficiency - Plan: VITAMIN D  25 Hydroxy (Vit-D Deficiency, Fractures)  Medication monitoring encounter - Patient initiated IV Reclast  on 09/09/2023.  Plan to check the following lab work today prior to scheduling next Reclast  infusion.- Plan: CBC with Differential/Platelet, Comprehensive metabolic panel with GFR, VITAMIN D  25 Hydroxy (Vit-D Deficiency, Fractures)  Idiopathic chronic gout of multiple sites without tophus - She has not had any signs or symptoms of a gout flare.  She is clinically doing well taking allopurinol  300 mg 1 tablet by mouth daily prescribed by  her PCP. uric acid: 5.5 on 03/11/2024.  Primary osteoarthritis of both hands: PIP and DIP thickening consistent with osteoarthritis of both hands.  No synovitis noted.  Primary osteoarthritis of both hips: No groin pain currently.  History of total knee replacement, bilateral - 2016, 2019 by Dr. Hiram. Doing well. Good ROM with no discomfort.   Primary osteoarthritis of both feet: Under care of podiatry.   Achilles tendinitis, right leg: Not currently symptomatic.   DDD (degenerative disc disease), cervical: C-spine has limited ROM with lateral rotation.   Degeneration of intervertebral disc of lumbar region without discogenic back pain or lower extremity pain: Intermittent discomfort.  Trapezius muscle spasm: She has trapezius muscle tension and tenderness bilaterally.  Other medical conditions are listed as follows:  History of osteomyelitis  Gastroesophageal reflux disease without esophagitis  History of migraine  History of sleep apnea  History of depression  Elevated LFTs: LFTs within normal limits on 03/11/2024.  CMP updated today.  Orders: Orders Placed This Encounter  Procedures   CBC with Differential/Platelet   Comprehensive metabolic panel with GFR   VITAMIN D  25 Hydroxy (Vit-D Deficiency, Fractures)   Meds ordered this encounter  Medications   Armodafinil  250 MG tablet    Sig: Take 1 tablet (250 mg total) by mouth daily  as needed.    Dispense:  30 tablet    Refill:  0     Follow-Up Instructions: Return in about 6 months (around 03/08/2025) for Osteoporosis.   Waddell CHRISTELLA Craze, PA-C  Note - This record has been created using Dragon software.  Chart creation errors have been sought, but may not always  have been located. Such creation errors do not reflect on  the standard of medical care.

## 2024-08-24 NOTE — Progress Notes (Signed)
 Chief Complaint  Patient presents with   scratchy throat    Scratchy throat, burns on roof of mouth, HA, fever and body aches. Had grandson Thursday night and Friday and he was sick (vomiting).    [name] is a 68 year old female who presents with fatigue, low-grade fever, pain on the roof of her mouth, and sore throat.  Symptoms began on August 22, 2024, with fatigue and mild myalgias. By the next day, she developed a low-grade fever of 99.43F and a worsening headache, which has been intermittent for about a week but is worse today. She experiences a scratchy, mild sore throat, especially in the middle and back, with no cough, postnasal drip, congestion, sneezing, or rhinorrhea. The roof of her mouth feels burned, and her husband observed a white appearance in her mouth.  She denies any known trauma or burns to the roof of her mouth.  She was in contact with her grandson last week (Thurs/Friday), who had a gastrointestinal illness and began vomiting on Friday. She has not experienced vomiting or diarrhea, except for one episode on Saturday night, attributed to her usual gastrointestinal issues.   She takes Tylenol  for pain, with the last dose two hours prior to the visit. Her myalgias had improved but worsened again today. She experiences discomfort in her right ear, described as plugging, but not painful.  She has stayed away from her 33 year old mother to prevent potential exposure after her grandson got sick.   PMH, PSH, SH reviewed  Outpatient Encounter Medications as of 08/24/2024  Medication Sig Note   acetaminophen  (TYLENOL ) 500 MG tablet Take 1,000 mg by mouth every 6 (six) hours as needed. 08/24/2024: Took 1000mg  at 2pm   albuterol  (VENTOLIN  HFA) 108 (90 Base) MCG/ACT inhaler TAKE 2 PUFFS BY MOUTH EVERY 6 HOURS AS NEEDED FOR WHEEZE OR SHORTNESS OF BREATH 08/24/2024: As needed   allopurinol  (ZYLOPRIM ) 300 MG tablet Take 1 tablet (300 mg total) by mouth daily.    ALPRAZolam   (XANAX ) 0.25 MG tablet TAKE 1 TABLET BY MOUTH THREE TIMES A DAY AS NEEDED FOR ANXIETY 08/24/2024: Took one at 11:30am   ARIPiprazole  (ABILIFY ) 5 MG tablet Take 1 tablet (5 mg total) by mouth daily.    Armodafinil  250 MG tablet TAKE 1 TABLET BY MOUTH ONCE DAILY AS NEEDED    aspirin  EC 81 MG tablet Take 81 mg by mouth in the morning. Swallow whole.    atorvastatin  (LIPITOR) 40 MG tablet Take 1 tablet (40 mg total) by mouth daily.    CALCIUM  PO Take 500 mg by mouth in the morning, at noon, and at bedtime.    DULoxetine  (CYMBALTA ) 60 MG capsule Take 1 capsule (60 mg total) by mouth 2 (two) times daily.    fluticasone  (FLONASE ) 50 MCG/ACT nasal spray SPRAY 2 SPRAYS INTO EACH NOSTRIL EVERY DAY    hypromellose (SYSTANE NIGHT) 0.3 % GEL ophthalmic ointment Place 1 Application into both eyes at bedtime as needed for dry eyes.    loratadine  (CLARITIN ) 10 MG tablet Take 10 mg by mouth daily.    meclizine  (ANTIVERT ) 25 MG tablet Take 1 tablet (25 mg total) by mouth 3 (three) times daily as needed for dizziness. 08/24/2024: As needed   methocarbamol  (ROBAXIN ) 500 MG tablet TAKE 1 TABLET BY MOUTH EVERY DAY AS NEEDED 08/24/2024: As needed   metoprolol  tartrate (LOPRESSOR ) 50 MG tablet Take 1 tablet (50 mg total) by mouth 2 (two) times daily.    Multiple Vitamins-Minerals (PRESERVISION AREDS 2) CAPS  Take 1 capsule by mouth in the morning and at bedtime.    ondansetron  (ZOFRAN ) 8 MG tablet Take 8 mg by mouth every 8 (eight) hours as needed for nausea. 08/24/2024: As needed   OXcarbazepine  (TRILEPTAL ) 150 MG tablet Take 150 mg by mouth in the morning and at bedtime.    pantoprazole  (PROTONIX ) 40 MG tablet Take 1 tablet (40 mg total) by mouth daily. 03/04/2024: qhs   PRESCRIPTION MEDICATION CPAP- At bedtime    Probiotic Product (PROBIOTIC ADVANCED PO) Take 1 capsule by mouth at bedtime.    promethazine  (PHENERGAN ) 25 MG tablet Take 25 mg by mouth every 6 (six) hours as needed for nausea or vomiting. 08/24/2024: As needed    RESTASIS  0.05 % ophthalmic emulsion Place 1 drop into both eyes in the morning and at bedtime.    SYSTANE ULTRA PF 0.4-0.3 % SOLN Place 1 drop into both eyes 3 (three) times daily as needed (for dryness).    traZODone  (DESYREL ) 50 MG tablet Take 1 tablet (50 mg total) by mouth at bedtime.    Zoledronic  Acid (RECLAST  IV) Infusion: 09/09/2023    [DISCONTINUED] gabapentin  (NEURONTIN ) 300 MG capsule Take 300 mg by mouth daily. 03/11/2024: 300mg  in am. 600mg  afternoon, and 900mg  at bedtime   Azelastine HCl (ASTEPRO NA) Place 1 spray into the nose 2 (two) times daily. (Patient not taking: Reported on 08/24/2024)    colchicine  0.6 MG tablet Take 1 tablet (0.6 mg total) by mouth as needed. (Patient not taking: Reported on 08/24/2024) 03/04/2024: As needed   No facility-administered encounter medications on file as of 08/24/2024.   Allergies  Allergen Reactions   Erythromycin Nausea Only and Other (See Comments)    Abdominal pain, also   Meperidine  Hcl Other (See Comments)    Demerol - Made the patient feel hot and her heart raced- made her feel flushed, also   Polyethyl Glyc-Propyl Glyc Pf Nausea And Vomiting and Other (See Comments)    RN confirmed with patient 9/4 (Miralax )   Polyethylene Glycol 3350  Other (See Comments)    Other Reaction(s): GI Intolerance  RN confirmed with patient 9/4 (Miralax )   Zolpidem  Other (See Comments)    Lost balance   Dilaudid  [Hydromorphone  Hcl] Itching    ROS: LG fever and scratchy throat, headache per HPI. Sore in mouth per HPI. R ear has been bothering her, not painful, just a discomfort. Saturday night vomited--seemed like her usual. No diarrhea or bowel changes. Denies nausea now, no abdominal pain. Feeling worse today, achey, sweats. No chest pain, shortness of breath, edema, or rash. See HPI    PHYSICAL EXAM:  BP 130/74   Pulse 80   Temp 98.7 F (37.1 C) (Tympanic)   Ht 5' 2.5 (1.588 m)   Wt 187 lb (84.8 kg)   LMP 07/27/2006   BMI 33.66 kg/m    Pleasant, well-appearing female in no distress HEENT: conjunctiva and sclera are clear, EOMI.  TM's and EAC's normal bilaterally.  Very mildly tender at R maxillary sinus. Aphthous ulcer behind front teeth, centrally (white in color).  Remainder of OP exam is normal, moist mucus membranes, normal tongue Neck: no lymphadenopathy or mass Heart: regular rate and rhythm, no murmur Lungs: clear bilaterally Abdomen: soft, nontender Extremities: no edema Skin: normal turgor. No rashes, palms are clear (feet not examined).  Rapid strep negative COVID test negative   ASSESSMENT/PLAN:   Viral syndrome - supportive measures reviewed.  +sick grandson/exposure. Repeat home COVID in 1-2d  Oral aphthous ulcer - supportive  measures reviewed--anbesol/tylenol , avoid spicy/acidic foods, cont salt water  gargles  Sore throat - Plan: POC COVID-19 BinaxNow, Rapid Strep A  Body aches - Plan: POC COVID-19 BinaxNow, Rapid Strep A  Fever, unspecified fever cause - Plan: POC COVID-19 BinaxNow, Rapid Strep A  Acute nonintractable headache, unspecified headache type - Plan: POC COVID-19 BinaxNow, Rapid Strep A  Symptoms consistent with viral syndrome (ddx includes COVID, HFM, other).  Negative initial strep and COVID tests.  Aphthous ulcer likely related to illness/virus. - Advise to repeat home COVID test in 1-2 days. - Recommend Anbesol for ulcer pain. - Suggest Tylenol  for pain. - Advise against visiting mother until COVID status confirmed. - Discuss potential isolation at beach (planned trip with husband) if COVID positive.

## 2024-08-24 NOTE — Telephone Encounter (Signed)
 Coming in to see Dr. Randol today @ 3:15pm

## 2024-08-24 NOTE — Patient Instructions (Signed)
 ACUTE VIRAL SYNDROME: Your symptoms suggest a viral infection. Initial tests for strep and COVID-19 were negative, but further testing is recommended. -Take a home COVID test in 1-2 days. -Use Anbesol for ulcer pain in your mouth. -Continue taking Tylenol  for pain relief. -Avoid visiting your mother until your COVID status is confirmed. -Consider isolating at the beach if you test positive for COVID. -Avoid public places if you test positive for COVID (until fever-free x 24 hours without the use of fever-reducing medications such as tylenol  or ibuprofen ).

## 2024-08-25 ENCOUNTER — Ambulatory Visit: Admitting: Family Medicine

## 2024-08-26 MED ORDER — TRAZODONE HCL 50 MG PO TABS: 50.0000 mg | ORAL_TABLET | Freq: Every day | ORAL | 0 refills | Status: AC

## 2024-09-07 ENCOUNTER — Other Ambulatory Visit: Payer: Self-pay | Admitting: Physician Assistant

## 2024-09-07 ENCOUNTER — Encounter: Payer: Self-pay | Admitting: Physician Assistant

## 2024-09-07 ENCOUNTER — Other Ambulatory Visit: Payer: Self-pay | Admitting: Pharmacist

## 2024-09-07 ENCOUNTER — Ambulatory Visit: Attending: Physician Assistant | Admitting: Physician Assistant

## 2024-09-07 ENCOUNTER — Telehealth (HOSPITAL_COMMUNITY): Payer: Self-pay

## 2024-09-07 VITALS — BP 139/85 | HR 67 | Temp 97.3°F | Resp 16 | Ht 62.5 in | Wt 183.6 lb

## 2024-09-07 DIAGNOSIS — R5383 Other fatigue: Secondary | ICD-10-CM | POA: Diagnosis not present

## 2024-09-07 DIAGNOSIS — R7989 Other specified abnormal findings of blood chemistry: Secondary | ICD-10-CM

## 2024-09-07 DIAGNOSIS — E559 Vitamin D deficiency, unspecified: Secondary | ICD-10-CM

## 2024-09-07 DIAGNOSIS — Z96653 Presence of artificial knee joint, bilateral: Secondary | ICD-10-CM

## 2024-09-07 DIAGNOSIS — M19041 Primary osteoarthritis, right hand: Secondary | ICD-10-CM

## 2024-09-07 DIAGNOSIS — M160 Bilateral primary osteoarthritis of hip: Secondary | ICD-10-CM

## 2024-09-07 DIAGNOSIS — M7661 Achilles tendinitis, right leg: Secondary | ICD-10-CM

## 2024-09-07 DIAGNOSIS — M1A09X0 Idiopathic chronic gout, multiple sites, without tophus (tophi): Secondary | ICD-10-CM

## 2024-09-07 DIAGNOSIS — M19071 Primary osteoarthritis, right ankle and foot: Secondary | ICD-10-CM | POA: Diagnosis not present

## 2024-09-07 DIAGNOSIS — M19042 Primary osteoarthritis, left hand: Secondary | ICD-10-CM

## 2024-09-07 DIAGNOSIS — M797 Fibromyalgia: Secondary | ICD-10-CM

## 2024-09-07 DIAGNOSIS — M810 Age-related osteoporosis without current pathological fracture: Secondary | ICD-10-CM | POA: Diagnosis not present

## 2024-09-07 DIAGNOSIS — K219 Gastro-esophageal reflux disease without esophagitis: Secondary | ICD-10-CM

## 2024-09-07 DIAGNOSIS — M62838 Other muscle spasm: Secondary | ICD-10-CM

## 2024-09-07 DIAGNOSIS — Z5181 Encounter for therapeutic drug level monitoring: Secondary | ICD-10-CM

## 2024-09-07 DIAGNOSIS — Z8669 Personal history of other diseases of the nervous system and sense organs: Secondary | ICD-10-CM

## 2024-09-07 DIAGNOSIS — M19072 Primary osteoarthritis, left ankle and foot: Secondary | ICD-10-CM

## 2024-09-07 DIAGNOSIS — Z8781 Personal history of (healed) traumatic fracture: Secondary | ICD-10-CM | POA: Diagnosis not present

## 2024-09-07 DIAGNOSIS — M51369 Other intervertebral disc degeneration, lumbar region without mention of lumbar back pain or lower extremity pain: Secondary | ICD-10-CM

## 2024-09-07 DIAGNOSIS — Z8739 Personal history of other diseases of the musculoskeletal system and connective tissue: Secondary | ICD-10-CM

## 2024-09-07 DIAGNOSIS — Z8659 Personal history of other mental and behavioral disorders: Secondary | ICD-10-CM

## 2024-09-07 DIAGNOSIS — M5030 Other cervical disc degeneration, unspecified cervical region: Secondary | ICD-10-CM

## 2024-09-07 MED ORDER — ARMODAFINIL 250 MG PO TABS
250.0000 mg | ORAL_TABLET | Freq: Every day | ORAL | 0 refills | Status: DC | PRN
Start: 2024-09-07 — End: 2024-10-08

## 2024-09-07 NOTE — Progress Notes (Signed)
 Referral placed for Reclast  IV ([id number]) at [facility] [phone]) [place] to start benefits investigation.  Diagnosis: age-related osteoporosis  Provider: Dr. Maya Nash and Waddell Craze, PA-C  Dose: 5mg  IV every 12 months Premedications: acetaminophen  650mg  p.o. and diphenhydramine  25mg  p.o.  Last dose/infusion date: 09/09/2023 (first dose) Last Clinic Visit: 09/07/24  Pertinent baseline labs: CMP and Vitamin D  on 09/07/24  Once benefits are processed, infusion center will contact patient to schedule.  Sherry Pennant, PharmD, MPH, BCPS, CPP Clinical Pharmacist [facility])

## 2024-09-07 NOTE — Telephone Encounter (Signed)
 Auth Submission: APPROVED Site of care: Site of care: [facility] Payer: [employer] Medicare Medication & CPT/J Code(s) submitted: Reclast  (Zolendronic acid) [id number] Diagnosis Code: M81.0 Route of submission (phone, fax, portal): portal Phone # Fax # Auth type: Buy/Bill HB Units/visits requested: 5mg  x 1 dose Reference number: [id number] Approval from: 09/07/24 to 09/07/25

## 2024-09-08 ENCOUNTER — Ambulatory Visit: Payer: Self-pay | Admitting: Physician Assistant

## 2024-09-08 DIAGNOSIS — H5213 Myopia, bilateral: Secondary | ICD-10-CM | POA: Diagnosis not present

## 2024-09-08 DIAGNOSIS — H59811 Chorioretinal scars after surgery for detachment, right eye: Secondary | ICD-10-CM | POA: Diagnosis not present

## 2024-09-08 DIAGNOSIS — H2513 Age-related nuclear cataract, bilateral: Secondary | ICD-10-CM | POA: Diagnosis not present

## 2024-09-08 DIAGNOSIS — Z79899 Other long term (current) drug therapy: Secondary | ICD-10-CM | POA: Diagnosis not present

## 2024-09-08 DIAGNOSIS — H52223 Regular astigmatism, bilateral: Secondary | ICD-10-CM | POA: Diagnosis not present

## 2024-09-08 DIAGNOSIS — H353132 Nonexudative age-related macular degeneration, bilateral, intermediate dry stage: Secondary | ICD-10-CM | POA: Diagnosis not present

## 2024-09-08 DIAGNOSIS — H524 Presbyopia: Secondary | ICD-10-CM | POA: Diagnosis not present

## 2024-09-08 LAB — COMPREHENSIVE METABOLIC PANEL WITH GFR
AG Ratio: 1.4 (calc) (ref 1.0–2.5)
ALT: 31 U/L — ABNORMAL HIGH (ref 6–29)
AST: 31 U/L (ref 10–35)
Albumin: 4 g/dL (ref 3.6–5.1)
Alkaline phosphatase (APISO): 96 U/L (ref 37–153)
BUN: 15 mg/dL (ref 7–25)
CO2: 27 mmol/L (ref 20–32)
Calcium: 9.4 mg/dL (ref 8.6–10.4)
Chloride: 102 mmol/L (ref 98–110)
Creat: 0.77 mg/dL (ref 0.50–1.05)
Globulin: 2.9 g/dL (ref 1.9–3.7)
Glucose, Bld: 89 mg/dL (ref 65–99)
Potassium: 4.5 mmol/L (ref 3.5–5.3)
Sodium: 140 mmol/L (ref 135–146)
Total Bilirubin: 0.4 mg/dL (ref 0.2–1.2)
Total Protein: 6.9 g/dL (ref 6.1–8.1)
eGFR: 84 mL/min/1.73m2 (ref >=60)

## 2024-09-08 LAB — CBC WITH DIFFERENTIAL/PLATELET
Absolute Lymphocytes: 2340 {cells}/uL (ref 850–3900)
Absolute Monocytes: 870 {cells}/uL (ref 200–950)
Basophils Absolute: 110 {cells}/uL (ref 0–200)
Basophils Relative: 1.1 %
Eosinophils Absolute: 120 {cells}/uL (ref 15–500)
Eosinophils Relative: 1.2 %
HCT: 40.3 % (ref 35.0–45.0)
Hemoglobin: 13.4 g/dL (ref 11.7–15.5)
MCH: 30.8 pg (ref 27.0–33.0)
MCHC: 33.3 g/dL (ref 32.0–36.0)
MCV: 92.6 fL (ref 80.0–100.0)
MPV: 9.7 fL (ref 7.5–12.5)
Monocytes Relative: 8.7 %
Neutro Abs: 6560 {cells}/uL (ref 1500–7800)
Neutrophils Relative %: 65.6 %
Platelets: 558 Thousand/uL — ABNORMAL HIGH (ref 140–400)
RBC: 4.35 Million/uL (ref 3.80–5.10)
RDW: 13.2 % (ref 11.0–15.0)
Total Lymphocyte: 23.4 %
WBC: 10 Thousand/uL (ref 3.8–10.8)

## 2024-09-08 LAB — VITAMIN D 25 HYDROXY (VIT D DEFICIENCY, FRACTURES): Vit D, 25-Hydroxy: 72 ng/mL (ref 30–100)

## 2024-09-08 NOTE — Progress Notes (Signed)
 Platelet count is elevated-558K, rest of CBC WNL.  Any recent infection? Vitamin D  WNL ALT borderline elevated-31, rest of CMP WNL

## 2024-09-09 DIAGNOSIS — Z96651 Presence of right artificial knee joint: Secondary | ICD-10-CM | POA: Diagnosis not present

## 2024-09-09 DIAGNOSIS — L97521 Non-pressure chronic ulcer of other part of left foot limited to breakdown of skin: Secondary | ICD-10-CM | POA: Diagnosis not present

## 2024-09-11 ENCOUNTER — Encounter: Payer: Self-pay | Admitting: Pharmacist

## 2024-09-16 DIAGNOSIS — K760 Fatty (change of) liver, not elsewhere classified: Secondary | ICD-10-CM | POA: Insufficient documentation

## 2024-09-16 NOTE — Progress Notes (Unsigned)
 No chief complaint on file.  [name] is a 68 y.o. female who presents for annual physical exam, Medicare wellness visit and follow-up on chronic medical conditions.     She was last seen a month ago with a viral syndrome, fever and mouth sore. She ultimately saw her dentist and was prescribed topical oral steroids. ***UPDATE  Pre-diabetes and obesity:  A1c was 6.2% on last check in 02/2024. She had Roux-En-Y on 09/29/2019.  She had gotten down into the 160's.  She has regained some weight (issues with her feet/surgeries, and then affected her moods). She has been losing weight *** UPDATE***  Wt Readings from Last 3 Encounters:  09/07/24 183 lb 9.6 oz (83.3 kg)  08/24/24 187 lb (84.8 kg)  04/21/24 199 lb 12.8 oz (90.6 kg)   03/11/24 202 lb (91.6 kg)  03/04/24 200 lb 12.8 oz (91.1 kg)  11/19/23 191 lb 9.6 oz (86.9 kg)   08/28/23 190 lb (86.2 kg)  07/03/23 195 lb (88.5 kg)  06/17/23 197 lb 12.8 oz (89.7 kg)  06/2021 wt 181#   Chronic nausea and heaving, under the care of GI. She underwent EGD on 08/13/23, which was normal. Some retained staples were noted, which were removed. GERD controlled with PPI.  She was noted to have elevated LFT's per Dr. Jammie labs in July 2024 (had been normal at GI earlier that month). She f/u with GI, and had further testing done. MRCP 09/2023: Impression 1. Status post cholecystectomy. Unchanged postoperative biliary ductal dilatation. 2. Hepatic steatosis. 3. Status post Roux type gastric bypass.  She saw Dr. Uvaldo last in 03/2024, dx  Metabolic Dysfunction-Associated Liver disease. Last LFT's were improved. She has been vaccinated for Hep A and B. Lab Results  Component Value Date   ALT 31 (H) 09/07/2024   AST 31 09/07/2024   ALKPHOS 103 03/11/2024   BILITOT 0.4 09/07/2024    Hyperlipidemia follow-up:  Patient is trying to follow a low-fat, low cholesterol diet as best she can.  Eats red meat 1-2x/week. Occasional eggs, no butter  or cheese.  Uses vinaigrette dressing.  Compliant with medications (atorvastatin  40mg ) and denies medication side effects.  Lipids were at goal on last check: Lab Results  Component Value Date   CHOL 124 03/11/2024   HDL 43 03/11/2024   LDLCALC 57 03/11/2024   TRIG 134 03/11/2024   CHOLHDL 2.9 03/11/2024    Hypertension: (never formally diagnosed, because had been on diuretics for fluid retention (stopped a while ago) and beta blockers for tachycardia and tremor--but high BP's noted when not taking these medications in past).   She continues on metoprolol  50mg  BID (originally prescribed by Dr. Nishan, for tachycardia). Denies chest pain, palpitations/tachycardia, lightheadedness, edema.   BP Readings from Last 3 Encounters:  09/07/24 139/85  08/24/24 130/74  04/21/24 120/75     Osteoporosis and compression fractures: She had T12 and L1 compression fractures after a fall in 01/2020. She had worsening back pain in July/August 2022, and was found to have new, acute compression fractures. She ultimately had kyphoplasty done 10/2021 by Dr. Lanis. She was started on Forteo  injections by rheum in 07/2021. DEXA in 2018 was normal.  DEXA 06/2020 T-2.0 R fem neck, osteopenia Dexa was repeated 06/2023 by rheum--T-1.9 L wrist, -1.4 L fem neck. They switched her from Forteo  to Reclast . She had infusion in 08/2023, and is due for another one now.   Gout--she continues on allopurinol  300mg  daily, and hasn't had any flares in several years.  Lab Results  Component Value Date   LABURIC 5.5 03/11/2024      She has OSA, under the care of Dr. Shlomo. She is on CPAP.  Energy is improved, but still requires Nuvigil . She doesn't sleep well all the time, so sometimes doesn't feel refreshed. Sleeping better with trazodone .   Depression:  Sees Regina at [facility], continues on Cymbalta , Abilify , and Trazodone .  She also prescribes her alprazolam  for prn use.  ***HOW OFTEN  She is under the care of  rheumatology clinic for fibromyalgia, osteoarthritis, degenerative disc disease, chronic fatigue, osteoporosis.    She was last seen earlier this month. She continues on cymbalta  (60 mg BID),  and robaxin  500mg  daily prn. They prescribe her armodafinil  for her chronic fatigue.    At last visit she was taking 1/2 pill daily--UPDATE ***  Her fibromyalgia hasn't been too bad, has pain daily but reports it is manageable most of the time.    She is under the care of Dr. Tonuzzi for RLS, neuropathy (pedal paresthesias), tremor (benign essential vs accentuated physiologic tremor), migraines.  Very rare migraine.  No longer having frequent headaches. She previously took gabapentin  4/d for neuropathy in her feet. This was stopped in June by Dr. Merita, who switched her to a trial of zonisamide. She didn't tolerate this. She continues on oxcarbazepine  300 mg twice daily    Seeing ortho for f/u from knee replacement--she is doing well, pain much improved. She recently developed some calf pain, felt to have a calf strain by ortho. Still getting PT?? *** She was also seen (by Dr. Elsa) for an ulcer at the tip of the second toe. This was felt to be due to a pressure type phenomenon in the setting of diabetes with neuropathy. Wound was improving with Neosporin and bandage. She was advise to continue with this, and to offload the area.   RIGHT OR LEFT?? ***  L shoulder pain:  She has been under the care of Dr. Kay, last seen in 05/2024.  She had MRI which showed chronic impingement with partial-thickness rotator cuff tearing and fraying. Recommending continued conservative management for her shoulder, given another subacromial cortisone injection to reduce inflammation and pain followed by home stretching and strengthening program.  ***UPDATE     Immunization History  Administered Date(s) Administered    sv, Bivalent, Protein Subunit Rsvpref,pf (Abrysvo) 08/31/2024   Fluad Quad(high Dose 65+)  09/06/2022   Fluad Trivalent(High Dose 65+) 08/28/2023   Hepatitis A, Adult 01/08/2024, 07/08/2024   Hepb-cpg 01/08/2024, 02/07/2024   INFLUENZA, HIGH DOSE SEASONAL PF 08/31/2024   Influenza Inj Mdck Quad Pf 09/13/2019   Influenza Split 09/17/2011, 09/03/2012, 09/04/2013   Influenza,inj,Quad PF,6+ Mos 08/16/2014, 07/27/2015, 07/31/2017, 08/13/2018, 07/28/2020   Influenza-Unspecified 10/04/2016, 07/31/2017, 09/23/2017, 09/18/2021   PFIZER Comirnaty(Gray Top)Covid-19 Tri-Sucrose Vaccine 06/01/2021   PFIZER(Purple Top)SARS-COV-2 Vaccination 02/20/2020, 03/12/2020, 09/20/2020   PNEUMOCOCCAL CONJUGATE-20 07/26/2022   Pfizer Covid-19 Vaccine Bivalent Booster 44yrs & up 02/05/2022   Pfizer(Comirnaty)Fall Seasonal Vaccine 12 years and older 01/31/2023   Tdap 01/16/2011, 06/20/2017   Zoster Recombinant(Shingrix) 02/05/2019, 07/14/2019   Last Pap smear: 03/2023 normal, per GYN Last mammogram: 03/2024 Last colonoscopy: 11/2012 [facility] Last DEXA: 06/2023 T-1.9 L wrist, -1.4 L fem neck. Dentist: twice yearly Ophtho: yearly Exercise:   Only walking in the last couple of weeks (related to husband's hospitalizations). Prior to this she was riding her exercise bike 20-30 minutes 4x/week. No weight-bearing exercise.  Vitamin D  has been normal.  Recent labs were done while  taking *** WHAT VITAMIN D ??  Component Ref Range & Units (hover) 9 d ago 1 yr ago 2 yr ago 3 yr ago 4 yr ago 8 yr ago  Vit D, 25-Hydroxy 72 71 CM 48 CM 60 CM 58.6 R, CM 32 CM   Thyroid : Lab Results  Component Value Date   TSH 2.406 01/22/2021   Patient Care Team: Randol Dawes, MD as PCP - General (Family Medicine) Delford Maude BROCKS, MD as PCP - Cardiology (Cardiology) Shlomo Wilbert SAUNDERS, MD as PCP - Sleep Medicine (Cardiology) Liane Sharyne MATSU, [facility] (Inactive) as Pharmacist (Pharmacist) Prentiss Annabella LABOR, NP as Nurse Practitioner (Gynecology) Charlyne Helling, MD as Referring Physician (Neurology) Dolphus Reiter, MD as  Consulting Physician (Rheumatology) Gregory Lenis, MD as Referring Physician (Gastroenterology) Lemont Sharper, MD as Referring Physician (Internal Medicine) Silva Juliene SAUNDERS, DPM as Consulting Physician (Podiatry) Camillo Golas, MD as Attending Physician (Ophthalmology) Watt Rush, MD as Attending Physician (Urology) Christi Glendia HERO, MD (Pulmonary Disease) Shellia Oh, MD as Consulting Physician (Pulmonary Disease) Melodi Lerner, MD as Consulting Physician (Orthopedic Surgery) Duwayne Purchase, MD as Consulting Physician (Orthopedic Surgery) Cary Doffing, MD as Consulting Physician (Dermatology) Overton Constance DASEN, MD as Consulting Physician (Infectious Diseases)  Psych: Angeline Sayers, NP Dentist: Dr. Suellyn Bariatric surgeon: Dr. Gladis GI: Dr. Dorise Hench   Depression Screening:    08/28/2023    3:10 PM 11/05/2022    9:32 AM 09/25/2022    9:38 AM 07/26/2022    2:41 PM 07/26/2022    9:13 AM  Depression screen PHQ 2/9  Decreased Interest 0 0 0 1 0  Down, Depressed, Hopeless 0 1 0 1 1  PHQ - 2 Score 0 1 0 2 1  Altered sleeping 1   3   Tired, decreased energy 2   2   Change in appetite 0   0   Feeling bad or failure about yourself  0   0   Trouble concentrating 1   1   Moving slowly or fidgety/restless 0   1   Suicidal thoughts 0   0   PHQ-9 Score 4   9   Difficult doing work/chores Not difficult at all   Somewhat difficult     Falls screen:     08/28/2023    3:10 PM 04/03/2023    3:27 PM 01/31/2023    3:19 PM 12/26/2022   10:32 AM 11/05/2022    9:32 AM  Fall Risk   Falls in the past year? 0 1 0 0 0  Number falls in past yr: 0 1 0 0   Injury with Fall? 0 0 0 0   Comment  fell last night     Risk for fall due to : No Fall Risks History of fall(s) No Fall Risks No Fall Risks No Fall Risks  Follow up Falls evaluation completed  Falls evaluation completed Falls evaluation completed Falls evaluation completed      Data saved with a previous flowsheet row definition      Functional Status Survey:        She has advanced directives (living will, healthcare power of attorney). Scanned in chart in 05/2019.  PMH, PSH, SH and FH were updated and reviewed.      ROS: The patient denies fever, vision changes, decreased hearing, breast concerns, palpitations, dizziness, syncope, cough, swelling, bowel changes (goes back and forth with diarrhea and constipation, per her usual), no abdominal pain, melena, hematochezia, indigestion/heartburn, hematuria, dysuria, vaginal bleeding, discharge, odor or itch, genital  lesions, weakness, suspicious skin lesions, abnormal bleeding/bruising, or enlarged lymph nodes.  Rare shortness of breath with stairs Fibromyalgia unchanged, manageable.   No gout flares. Chronic urinary leakage, stable. Upper back pain, daily, manageable. Neuropathy in LE's--burning in feet, chronic/unchanged No headaches or migraines in a while. Moods are good, just some stress related to husband's health issues. ***   PHYSICAL EXAM:  LMP 07/27/2006   Wt Readings from Last 3 Encounters:  09/07/24 183 lb 9.6 oz (83.3 kg)  08/24/24 187 lb (84.8 kg)  04/21/24 199 lb 12.8 oz (90.6 kg)    General Appearance:    Alert, cooperative, no distress, appears stated age  Head:    Normocephalic, without obvious abnormality, atraumatic  Eyes:    PERRL, conjunctiva/corneas clear, EOM's intact, fundi    benign  Ears:    Normal TM's and external ear canals  Nose:   Normal, no drainage or sinus tenderness  Throat:  Normal no lesions  Neck:   Supple, no lymphadenopathy;  thyroid :  no enlargement/tenderness/nodules; no carotid bruit or JVD  Back:    Spine nontender, no curvature, ROM normal, no CVA tenderness. Mild tenderness at R trapezius, though no palpable spasm. ***  Lungs:     Clear to auscultation bilaterally without wheezes, rales or  ronchi; respirations unlabored  Chest Wall:    No tenderness or deformity.    Heart:    Regular rate and rhythm, S1  and S2 normal, no murmur, rub or gallop  Breast Exam:    Deferred to GYN  Abdomen:     Soft, obese, nondistended, nontender, normoactive bowel sounds, no masses, no hepatosplenomegaly.   Genitalia:    Deferred to GYN       Extremities:   No clubbing, cyanosis or edema. R great toe is much larger than the left, without lesions, erythema, warmth or tenderness.  The R 2nd toe is much longer (straight, doesn't bend, since she had tendon clipped  Pulses:   2+ and symmetric all extremities  Skin:   Skin color, texture, turgor normal, no rashes or lesions.  Lymph nodes:   Cervical, supraclavicular nodes normal  Neurologic:   Normal strength, sensation and gait; reflexes 2+ and symmetric throughout. No significant tremor noted                             Psych:   Normal mood, full range of affect, normal hygiene and grooming  ***UPDATE_-tenderness in trap R only?? *** UPDATE TOE exam   Recent labs: Vitamin D -OH 72 Lab Results  Component Value Date   WBC 10.0 09/07/2024   HGB 13.4 09/07/2024   HCT 40.3 09/07/2024   MCV 92.6 09/07/2024   PLT 558 (H) 09/07/2024   Fasting glu 89    Chemistry      Component Value Date/Time   NA 140 09/07/2024 1028   NA 143 03/11/2024 1022   K 4.5 09/07/2024 1028   CL 102 09/07/2024 1028   CO2 27 09/07/2024 1028   BUN 15 09/07/2024 1028   BUN 15 03/11/2024 1022   CREATININE 0.77 09/07/2024 1028      Component Value Date/Time   CALCIUM  9.4 09/07/2024 1028   ALKPHOS 103 03/11/2024 1022   AST 31 09/07/2024 1028   ALT 31 (H) 09/07/2024 1028   BILITOT 0.4 09/07/2024 1028   BILITOT <0.2 03/11/2024 1022       ASSESSMENT/PLAN:  A1c. Consider TSH--last check in 2022. Do if any  sx (can use fatigue, depression)  Tonuzzi put her on zonisamide in place of gabapentin  in June. I see a phone message that she wasn't tolerating--was it stopped?  Should we list as intolerance in allergy list? Document SE if this is the case.  Confirm if the oxcarbazepine   is 150mg  or  300 mg twice daily  (we have 150 in our records, Dr. Ramond notes state 300mg  BID), please update to 300 mg BID if that's what she is taking  How often taking xanax ?   COVID shot  Looking at Care Everywhere, she had visit 04/2023 with Paula stuart PA--mentioned normal colonoscopy in 2020 due again 10 years. We do not have a copy of this colonoscopy. We need to get it ([facility]).  Thanks    Discussed monthly self breast exams and yearly mammograms; at least 30 minutes of aerobic activity at least 5 days/week, weight-bearing exercise at least 2x/wk; proper sunscreen use reviewed; healthy diet, including goals of calcium  and vitamin D  intake and alcohol  recommendations (less than or equal to 1 drink/day) reviewed; regular seatbelt use; changing batteries in smoke detectors, carbon monoxide detectors. Immunization recommendations discussed--continue yearly high dose flu shots. COVID booster *** Colonoscopy--per [facility], had colonoscopy in 2020, due again 10 years. We need to get this report.  Full Code, Full Care MOST form reviewed/updated   Medicare Attestation I have personally reviewed: The patient's medical and social history Their use of alcohol , tobacco or illicit drugs Their current medications and supplements The patient's functional ability including ADLs,fall risks, home safety risks, cognitive, and hearing and visual impairment Diet and physical activities Evidence for depression or mood disorders  The patient's weight, height, BMI have been recorded in the chart.  I have made referrals, counseling, and provided education to the patient based on review of the above and I have provided the patient with a written personalized care plan for preventive services.

## 2024-09-17 ENCOUNTER — Ambulatory Visit: Admitting: Family Medicine

## 2024-09-17 ENCOUNTER — Encounter: Payer: Self-pay | Admitting: Family Medicine

## 2024-09-17 VITALS — BP 130/80 | HR 76 | Ht 62.0 in | Wt 185.8 lb

## 2024-09-17 DIAGNOSIS — Z23 Encounter for immunization: Secondary | ICD-10-CM

## 2024-09-17 DIAGNOSIS — G4733 Obstructive sleep apnea (adult) (pediatric): Secondary | ICD-10-CM

## 2024-09-17 DIAGNOSIS — M810 Age-related osteoporosis without current pathological fracture: Secondary | ICD-10-CM | POA: Diagnosis not present

## 2024-09-17 DIAGNOSIS — R5383 Other fatigue: Secondary | ICD-10-CM

## 2024-09-17 DIAGNOSIS — Z0000 Encounter for general adult medical examination without abnormal findings: Secondary | ICD-10-CM | POA: Diagnosis not present

## 2024-09-17 DIAGNOSIS — M1A9XX0 Chronic gout, unspecified, without tophus (tophi): Secondary | ICD-10-CM | POA: Diagnosis not present

## 2024-09-17 DIAGNOSIS — Z9884 Bariatric surgery status: Secondary | ICD-10-CM

## 2024-09-17 DIAGNOSIS — R7301 Impaired fasting glucose: Secondary | ICD-10-CM | POA: Diagnosis not present

## 2024-09-17 DIAGNOSIS — K760 Fatty (change of) liver, not elsewhere classified: Secondary | ICD-10-CM

## 2024-09-17 DIAGNOSIS — E782 Mixed hyperlipidemia: Secondary | ICD-10-CM

## 2024-09-17 DIAGNOSIS — K219 Gastro-esophageal reflux disease without esophagitis: Secondary | ICD-10-CM | POA: Diagnosis not present

## 2024-09-17 DIAGNOSIS — M797 Fibromyalgia: Secondary | ICD-10-CM | POA: Diagnosis not present

## 2024-09-17 DIAGNOSIS — F324 Major depressive disorder, single episode, in partial remission: Secondary | ICD-10-CM

## 2024-09-17 DIAGNOSIS — I10 Essential (primary) hypertension: Secondary | ICD-10-CM

## 2024-09-17 DIAGNOSIS — G2581 Restless legs syndrome: Secondary | ICD-10-CM

## 2024-09-17 LAB — POCT GLYCOSYLATED HEMOGLOBIN (HGB A1C): Hemoglobin A1C: 5.8 % — AB (ref 4.0–5.6)

## 2024-09-17 MED ORDER — METOPROLOL TARTRATE 50 MG PO TABS
50.0000 mg | ORAL_TABLET | Freq: Two times a day (BID) | ORAL | 1 refills | Status: AC
Start: 2024-09-17 — End: ?

## 2024-09-17 NOTE — Patient Instructions (Addendum)
  HEALTH MAINTENANCE RECOMMENDATIONS:  It is recommended that you get at least 30 minutes of aerobic exercise at least 5 days/week (for weight loss, you may need as much as 60-90 minutes). This can be any activity that gets your heart rate up. This can be divided in 10-15 minute intervals if needed, but try and build up your endurance at least once a week.  Weight bearing exercise is also recommended twice weekly.  Eat a healthy diet with lots of vegetables, fruits and fiber.  Colorful foods have a lot of vitamins (ie green vegetables, tomatoes, red peppers, etc).  Limit sweet tea, regular sodas and alcoholic beverages, all of which has a lot of calories and sugar.  Up to 1 alcoholic drink daily may be beneficial for women (unless trying to lose weight, watch sugars).  Drink a lot of water .  Calcium  recommendations are 1200-1500 mg daily (1500 mg for postmenopausal women or women without ovaries), and vitamin D  1000 IU daily.  This should be obtained from diet and/or supplements (vitamins), and calcium  should not be taken all at once, but in divided doses.  Monthly self breast exams and yearly mammograms for women over the age of 63 is recommended.  Sunscreen of at least SPF 30 should be used on all sun-exposed parts of the skin when outside between the hours of 10 am and 4 pm (not just when at beach or pool, but even with exercise, golf, tennis, and yard work!)  Use a sunscreen that says broad spectrum so it covers both UVA and UVB rays, and make sure to reapply every 1-2 hours.  Remember to change the batteries in your smoke detectors when changing your clock times in the spring and fall. Carbon monoxide detectors are recommended for your home.  Use your seat belt every time you are in a car, and please drive safely and not be distracted with cell phones and texting while driving.   [name] , Thank you for taking time to come for your Medicare Wellness Visit. I appreciate your ongoing  commitment to your health goals. Please review the following plan we discussed and let me know if I can assist you in the future.   This is a list of the screening recommended for you and due dates:  Health Maintenance  Topic Date Due   Medicare Annual Wellness Visit  08/27/2024   COVID-19 Vaccine (7 - 2025-26 season) 10/03/2024*   Breast Cancer Screening  04/15/2025   DTaP/Tdap/Td vaccine (3 - Td or Tdap) 06/21/2027   Colon Cancer Screening  07/15/2029   Pneumococcal Vaccine for age over 8  Completed   Flu Shot  Completed   DEXA scan (bone density measurement)  Completed   Hepatitis C Screening  Completed   Zoster (Shingles) Vaccine  Completed   Meningitis B Vaccine  Aged Out   Hepatitis B Vaccine  Discontinued  *Topic was postponed. The date shown is not the original due date.

## 2024-09-18 ENCOUNTER — Ambulatory Visit: Payer: Self-pay | Admitting: Family Medicine

## 2024-09-18 LAB — TSH: TSH: 1.4 u[IU]/mL (ref 0.450–4.500)

## 2024-09-22 NOTE — Progress Notes (Signed)
 Called patient - she was waiting on clearance from dental surgeon to proceed with Reclast . Provided her with phone number to schedule infusion.

## 2024-10-02 ENCOUNTER — Other Ambulatory Visit: Payer: Self-pay

## 2024-10-02 ENCOUNTER — Emergency Department (HOSPITAL_BASED_OUTPATIENT_CLINIC_OR_DEPARTMENT_OTHER)

## 2024-10-02 ENCOUNTER — Emergency Department (HOSPITAL_BASED_OUTPATIENT_CLINIC_OR_DEPARTMENT_OTHER): Admitting: Radiology

## 2024-10-02 ENCOUNTER — Emergency Department (HOSPITAL_BASED_OUTPATIENT_CLINIC_OR_DEPARTMENT_OTHER)
Admission: EM | Admit: 2024-10-02 | Discharge: 2024-10-02 | Disposition: A | Discharge status: Home / Self Care | Attending: Emergency Medicine | Admitting: Emergency Medicine

## 2024-10-02 ENCOUNTER — Encounter (HOSPITAL_BASED_OUTPATIENT_CLINIC_OR_DEPARTMENT_OTHER): Payer: Self-pay

## 2024-10-02 DIAGNOSIS — S0990XA Unspecified injury of head, initial encounter: Secondary | ICD-10-CM | POA: Diagnosis present

## 2024-10-02 DIAGNOSIS — R0789 Other chest pain: Secondary | ICD-10-CM | POA: Diagnosis not present

## 2024-10-02 DIAGNOSIS — W01198A Fall on same level from slipping, tripping and stumbling with subsequent striking against other object, initial encounter: Secondary | ICD-10-CM | POA: Diagnosis not present

## 2024-10-02 DIAGNOSIS — Z7982 Long term (current) use of aspirin: Secondary | ICD-10-CM | POA: Diagnosis not present

## 2024-10-02 DIAGNOSIS — W19XXXA Unspecified fall, initial encounter: Secondary | ICD-10-CM

## 2024-10-02 NOTE — Discharge Instructions (Addendum)
 All the x-rays today look normal.  There is no sign of internal bleeding or injury to your spine.  You will be sore over the next few days so it is okay to take Tylenol  as needed for that

## 2024-10-02 NOTE — ED Provider Notes (Signed)
  EMERGENCY DEPARTMENT AT [facility] Provider Note   CSN: [id number] Arrival date & time: 10/02/24  9256     Patient presents with: Fall and Head Injury   [name] is a 68 y.o. female.   Patient is a 68 year old female with a history of migraines, fibromyalgia, chronic fatigue syndrome, gastroparesis, paresthesias, lumbar radiculopathy who is presenting today after a fall.  She has been helping take care of her 15 year old mother intermittently with other family members and was there this morning and at 5 AM reports she had just bent over to pick up the phone and was attempting to plug it in when she lost her balance and stepped backwards but could not catch herself and fell hitting the back of her head on a chair and the left side of her head and shoulder on a table.  She denies any loss of consciousness.  She was able to get up and has been able to walk but reports that since the fall she has had some tightness in her chest and some worsening pain in her neck.  She does not have a headache and does not feel quite right but cannot describe exactly what that means.  She has not had any nausea vomiting or vision changes.  She denies any weakness or numbness in her extremities.  She takes a baby aspirin  but no other anticoagulation.  She denies any recent medication changes.  Reports she felt her normal self prior to the fall and denies presyncopal symptoms, shortness of breath or dizziness prior to falling.  Her husband picked her up and reports that since he has been with her she has been her normal self.  The history is provided by the patient.  Fall  Head Injury      Prior to Admission medications   Medication Sig Start Date End Date Taking? Authorizing Provider  acetaminophen  (TYLENOL ) 500 MG tablet Take 1,000 mg by mouth every 6 (six) hours as needed.    [provider]  albuterol  (VENTOLIN  HFA) 108 (90 Base) MCG/ACT inhaler TAKE 2 PUFFS BY MOUTH EVERY  6 HOURS AS NEEDED FOR WHEEZE OR SHORTNESS OF BREATH 01/31/23   Randol Dawes, MD  allopurinol  (ZYLOPRIM ) 300 MG tablet Take 1 tablet (300 mg total) by mouth daily. 03/11/24   Randol Dawes, MD  ALPRAZolam  (XANAX ) 0.25 MG tablet TAKE 1 TABLET BY MOUTH THREE TIMES A DAY AS NEEDED FOR ANXIETY 08/21/24   [name], Leah Nattalie, NP  ARIPiprazole  (ABILIFY ) 5 MG tablet Take 1 tablet (5 mg total) by mouth daily. 05/14/24   [name], Leah Nattalie, NP  Armodafinil  250 MG tablet Take 1 tablet (250 mg total) by mouth daily as needed. 09/07/24   Leah Waddell HERO, [name]  aspirin  EC 81 MG tablet Take 81 mg by mouth in the morning. Swallow whole.    [provider]  atorvastatin  (LIPITOR) 40 MG tablet Take 1 tablet (40 mg total) by mouth daily. 03/11/24   Randol Dawes, MD  Azelastine HCl (ASTEPRO NA) Place 1 spray into the nose 2 (two) times daily.    [provider]  CALCIUM  PO Take 500 mg by mouth in the morning, at noon, and at bedtime.    [provider]  colchicine  0.6 MG tablet Take 1 tablet (0.6 mg total) by mouth as needed. 12/10/22   Leah Waddell HERO, [name]  DULoxetine  (CYMBALTA ) 60 MG capsule Take 1 capsule (60 mg total) by mouth 2 (two) times daily. 05/14/24   [name],  Leah Nattalie, NP  fluticasone  (FLONASE ) 50 MCG/ACT nasal spray SPRAY 2 SPRAYS INTO EACH NOSTRIL EVERY DAY 02/03/24   Randol Dawes, MD  HYDROcodone -acetaminophen  (NORCO/VICODIN) 5-325 MG tablet as needed. 05/11/24   [provider]  hypromellose (SYSTANE NIGHT) 0.3 % GEL ophthalmic ointment Place 1 Application into both eyes at bedtime as needed for dry eyes.    [provider]  loratadine  (CLARITIN ) 10 MG tablet Take 10 mg by mouth daily.    [provider]  meclizine  (ANTIVERT ) 25 MG tablet Take 1 tablet (25 mg total) by mouth 3 (three) times daily as needed for dizziness. 07/22/19   Randol Dawes, MD  methocarbamol  (ROBAXIN ) 500 MG tablet TAKE 1 TABLET BY MOUTH EVERY DAY AS NEEDED 07/28/24   Leah Waddell HERO,  [name]  metoprolol  tartrate (LOPRESSOR ) 50 MG tablet Take 1 tablet (50 mg total) by mouth 2 (two) times daily. 09/17/24   Randol Dawes, MD  Multiple Vitamins-Minerals (PRESERVISION AREDS 2) CAPS Take 1 capsule by mouth in the morning and at bedtime.    [provider]  mupirocin  ointment (BACTROBAN ) 2 % SMARTSIG:1 sparingly Topical Twice Daily 08/12/24   [provider]  ondansetron  (ZOFRAN ) 8 MG tablet Take 8 mg by mouth every 8 (eight) hours as needed for nausea. 07/16/18   [provider]  Oxcarbazepine  (TRILEPTAL ) 300 MG tablet Take 300 mg by mouth 2 (two) times daily.    [provider]  pantoprazole  (PROTONIX ) 40 MG tablet Take 1 tablet (40 mg total) by mouth daily. 10/01/19   Gladis Cough, MD  PRESCRIPTION MEDICATION CPAP- At bedtime    [provider]  Probiotic Product (PROBIOTIC ADVANCED PO) Take 1 capsule by mouth at bedtime.    [provider]  promethazine  (PHENERGAN ) 25 MG tablet Take 25 mg by mouth every 6 (six) hours as needed for nausea or vomiting. 10/27/20   [provider]  RESTASIS  0.05 % ophthalmic emulsion Place 1 drop into both eyes in the morning and at bedtime. 12/10/19   [provider]  SYSTANE ULTRA PF 0.4-0.3 % SOLN Place 1 drop into both eyes 3 (three) times daily as needed (for dryness).    [provider]  traMADol  (ULTRAM ) 50 MG tablet Take 50 mg by mouth every 6 (six) hours as needed. 07/06/24   [provider]  traZODone  (DESYREL ) 50 MG tablet Take 1-2 tablets (50-100 mg total) by mouth at bedtime. 08/26/24   [name], Leah Nattalie, NP  Zoledronic  Acid (RECLAST  IV) Infusion: 09/09/2023    [provider]    Allergies: Erythromycin, Meperidine  hcl, Polyethyl glyc-propyl glyc pf, Polyethylene glycol 3350 , Zolpidem , Zonisamide, and Dilaudid  [hydromorphone  hcl]    Review of Systems  Updated Vital Signs BP 124/62   Pulse (!) 58   Temp 97.9 F (36.6 C) (Oral)   Resp 15    LMP 07/27/2006   SpO2 96%   Physical Exam Vitals and nursing note reviewed.  Constitutional:      General: She is not in acute distress.    Appearance: She is well-developed.  HENT:     Head: Normocephalic and atraumatic.  Eyes:     Pupils: Pupils are equal, round, and reactive to light.  Cardiovascular:     Rate and Rhythm: Normal rate and regular rhythm.     Heart sounds: Normal heart sounds. No murmur heard.    No friction rub.  Pulmonary:     Effort: Pulmonary effort is normal.     Breath sounds: Normal breath  sounds. No wheezing or rales.  Chest:     Chest wall: Tenderness present.  Abdominal:     General: Bowel sounds are normal. There is no distension.     Palpations: Abdomen is soft.     Tenderness: There is no abdominal tenderness. There is no guarding or rebound.  Musculoskeletal:        General: No tenderness. Normal range of motion.     Cervical back: Neck supple.     Comments: No edema  Skin:    General: Skin is warm and dry.     Findings: No rash.  Neurological:     Mental Status: She is alert and oriented to person, place, and time. Mental status is at baseline.     Cranial Nerves: No cranial nerve deficit.     Sensory: No sensory deficit.     Motor: No weakness.     Gait: Gait normal.  Psychiatric:        Behavior: Behavior normal.     (all labs ordered are listed, but only abnormal results are displayed) Labs Reviewed - No data to display  EKG: EKG Interpretation Date/Time:  Friday October 02 2024 07:52:44 EST Ventricular Rate:  66 PR Interval:  186 QRS Duration:  106 QT Interval:  446 QTC Calculation: 468 R Axis:   56  Text Interpretation: Sinus rhythm Baseline wander in lead(s) V2 No significant change since last tracing Confirmed by Doretha Folks ([id number]) on 10/02/2024 8:39:24 AM  Radiology: CT Head Wo Contrast Result Date: 10/02/2024 EXAM: CT HEAD WITHOUT CONTRAST 10/02/2024 08:38:19 AM TECHNIQUE: CT of the head was performed  without the administration of intravenous contrast. Automated exposure control, iterative reconstruction, and/or weight based adjustment of the mA/kV was utilized to reduce the radiation dose to as low as reasonably achievable. COMPARISON: Head CT 12/31/2021 and MRI 01/22/2021. CLINICAL HISTORY: Head trauma, minor (Age >= 65y). FINDINGS: BRAIN AND VENTRICLES: There is no evidence of an acute infarct, intracranial hemorrhage, mass, midline shift, hydrocephalus, or extra-axial fluid collection. Cerebral volume is normal. ORBITS: No acute abnormality. SINUSES: No acute abnormality. SOFT TISSUES AND SKULL: No acute soft tissue abnormality. No skull fracture. IMPRESSION: 1. Negative head CT. Electronically signed by: Dasie Hamburg MD 10/02/2024 09:09 AM EST RP Workstation: [id number]   CT Cervical Spine Wo Contrast Result Date: 10/02/2024 EXAM: CT CERVICAL SPINE WITHOUT CONTRAST 10/02/2024 08:38:19 AM TECHNIQUE: CT of the cervical spine was performed without the administration of intravenous contrast. Multiplanar reformatted images are provided for review. Automated exposure control, iterative reconstruction, and/or weight based adjustment of the mA/kV was utilized to reduce the radiation dose to as low as reasonably achievable. COMPARISON: CT cervical spine 12/31/2021 CLINICAL HISTORY: Neck trauma (Age >= 65y); midline tenderness after fall FINDINGS: CERVICAL SPINE: BONES AND ALIGNMENT: Chronic straightening/slight reversal of the normal cervical lordosis. No significant listhesis. No acute fracture or suspicious lesion. Moderate to prominent spurring superiorly at the atlantodental articulation. DEGENERATIVE CHANGES: Relatively mild overall cervical spondylosis and facet arthrosis. Disc degeneration is greatest at C5-C6 where a broad based posterior disc osteophyte complex and uncovertebral spurring result in at least mild spinal stenosis and mild to moderate right neural foraminal stenosis. SOFT TISSUES: No  prevertebral soft tissue swelling. IMPRESSION: 1. No acute cervical spine fracture no traumatic malalignment. Electronically signed by: Dasie Hamburg MD 10/02/2024 09:07 AM EST RP Workstation: [id number]   DG Chest 2 View Result Date: 10/02/2024 EXAM: 2 VIEW(S) XRAY OF THE CHEST 10/02/2024 08:21:00 AM COMPARISON: None available. CLINICAL HISTORY: fall, injury  FINDINGS: LUNGS AND PLEURA: Low lung volumes. Streaky opacities in the left lung base, likely atelectasis. No pulmonary edema. No pleural effusion. No pneumothorax. HEART AND MEDIASTINUM: Tortuous thoracic aorta. No acute abnormality of the cardiac silhouette. BONES AND SOFT TISSUES: Severe chronic compression fracture within the mid thoracic spine with vertebroplasty cement noted. There is also vertebroplasty cement in the adjacent lower thoracic vertebral body. Cholecystectomy clips. IMPRESSION: 1. No acute cardiopulmonary abnormality. Electronically signed by: Rogelia Myers MD 10/02/2024 08:33 AM EST RP Workstation: [id number]     Procedures   Medications Ordered in the ED - No data to display                                  Medical Decision Making Amount and/or Complexity of Data Reviewed Radiology: ordered and independent interpretation performed. Decision-making details documented in ED Course.   Pt with multiple medical problems and comorbidities and presenting today with a complaint that caries a high risk for morbidity and mortality.  Here today after a fall.  No loss of consciousness and patient does not take anticoagulants.  Fall seems to be related to losing her balance but denies symptoms of dizziness or symptoms suggestive of strokelike symptoms or cardiac symptoms as the cause of her fall.  Will do imaging to ensure no new acute injury.  Neurovascularly intact at this time.  Patient is reporting that she is having some chest pain after the fall but denies any prior to.  I independently interpreted patient's EKG which shows no  acute findings and low suspicion of ACS.  Feel that chest discomfort is related to her recent fall.  Family member at bedside reports she has been at her baseline  I have independently visualized and interpreted pt's images today.  Imaging within normal limits.  Head CT negative for bleed, cervical spine negative for fracture and chest x-ray is normal.  Radiology reports no acute finding at this time patient is still at baseline and is stable for discharge home.  Findings discussed with the patient and her husband and they are comfortable with this plan.       Final diagnoses:  Fall, initial encounter  Injury of head, initial encounter    ED Discharge Orders     None          Doretha Folks, MD 10/02/24 [phone]

## 2024-10-02 NOTE — ED Triage Notes (Signed)
 Patient reports mechanical fall where she fell back and hit her head. -LOC, and not on anticoagulation therapy. She said she doesn't feel like her head hurts much but does note chest tightness. She said her chest did not feel tight prior to the fall.

## 2024-10-08 ENCOUNTER — Other Ambulatory Visit: Payer: Self-pay | Admitting: Physician Assistant

## 2024-10-08 DIAGNOSIS — M797 Fibromyalgia: Secondary | ICD-10-CM

## 2024-10-08 DIAGNOSIS — R5383 Other fatigue: Secondary | ICD-10-CM

## 2024-10-08 NOTE — Telephone Encounter (Signed)
 Last Fill: 09/07/2024  Next Visit: 03/09/2025  Last Visit: 09/07/2024  Dx:  Fibromyalgia   Current Dose per office note on 09/07/2024: Armodafinil  250 MG tablet   Okay to refill Armodafinil ?

## 2024-10-09 ENCOUNTER — Ambulatory Visit (HOSPITAL_COMMUNITY)
Admission: RE | Admit: 2024-10-09 | Discharge: 2024-10-09 | Disposition: A | Discharge status: Home / Self Care | Source: Ambulatory Visit | Attending: Physician Assistant | Admitting: Physician Assistant

## 2024-10-09 VITALS — BP 144/85 | HR 61 | Temp 98.2°F | Resp 16

## 2024-10-09 DIAGNOSIS — M810 Age-related osteoporosis without current pathological fracture: Secondary | ICD-10-CM | POA: Insufficient documentation

## 2024-10-09 MED ORDER — ZOLEDRONIC ACID 5 MG/100ML IV SOLN
INTRAVENOUS | Status: AC
  Filled 2024-10-09: qty 100

## 2024-10-09 MED ORDER — DIPHENHYDRAMINE HCL 25 MG PO CAPS
25.0000 mg | ORAL_CAPSULE | Freq: Once | ORAL | Status: AC
  Administered 2024-10-09: 25 mg via ORAL

## 2024-10-09 MED ORDER — ACETAMINOPHEN 325 MG PO TABS: 650.0000 mg | ORAL_TABLET | Freq: Once | ORAL | Status: DC

## 2024-10-09 MED ORDER — ZOLEDRONIC ACID 5 MG/100ML IV SOLN
5.0000 mg | Freq: Once | INTRAVENOUS | Status: AC
  Administered 2024-10-09: 5 mg via INTRAVENOUS

## 2024-10-09 MED ORDER — DIPHENHYDRAMINE HCL 25 MG PO CAPS
ORAL_CAPSULE | ORAL | Status: AC
  Filled 2024-10-09: qty 1

## 2024-10-24 ENCOUNTER — Other Ambulatory Visit: Payer: Self-pay | Admitting: Physician Assistant

## 2024-10-26 NOTE — Telephone Encounter (Signed)
 Last Fill: 07/28/2024  Next Visit: 03/09/2025  Last Visit: 09/07/2024  Dx: Fibromyalgia   Current Dose per office note on 09/07/2024: methocarbamol  500 mg 1 tablet daily as needed for muscle spasms.   Okay to refill Methocarbamol ?

## 2024-11-02 ENCOUNTER — Other Ambulatory Visit: Payer: Self-pay | Admitting: Adult Health

## 2024-11-02 DIAGNOSIS — G4700 Insomnia, unspecified: Secondary | ICD-10-CM

## 2024-11-03 NOTE — Telephone Encounter (Signed)
 Pt called at 11:23a requesting refill of Alprazolam  to   [id number] - [place], [place] - [address] AT [facility] [address] [place] [address] Phone: [phone]  Fax: [phone]   Next appt 12/19

## 2024-11-09 ENCOUNTER — Other Ambulatory Visit: Payer: Self-pay | Admitting: Physician Assistant

## 2024-11-09 DIAGNOSIS — M797 Fibromyalgia: Secondary | ICD-10-CM

## 2024-11-09 DIAGNOSIS — R5383 Other fatigue: Secondary | ICD-10-CM

## 2024-11-09 NOTE — Telephone Encounter (Signed)
 Last Fill: 10/08/2024  Next Visit: 03/09/2025  Last Visit: 09/07/2024  Dx: Fibromyalgia   Current Dose per office note on 09/07/2024: Armodafinil  250 MG tablet   Okay to refill Armodafinil ?

## 2024-11-13 ENCOUNTER — Ambulatory Visit: Admitting: Adult Health

## 2024-11-13 ENCOUNTER — Encounter: Payer: Self-pay | Admitting: Adult Health

## 2024-11-13 DIAGNOSIS — F411 Generalized anxiety disorder: Secondary | ICD-10-CM | POA: Diagnosis not present

## 2024-11-13 DIAGNOSIS — F331 Major depressive disorder, recurrent, moderate: Secondary | ICD-10-CM

## 2024-11-13 DIAGNOSIS — M1A9XX0 Chronic gout, unspecified, without tophus (tophi): Secondary | ICD-10-CM

## 2024-11-13 DIAGNOSIS — G4700 Insomnia, unspecified: Secondary | ICD-10-CM

## 2024-11-13 MED ORDER — TRAZODONE HCL 50 MG PO TABS: 50.0000 mg | ORAL_TABLET | Freq: Every day | ORAL | 1 refills | Status: AC

## 2024-11-13 MED ORDER — DULOXETINE HCL 60 MG PO CPEP: 60.0000 mg | ORAL_CAPSULE | Freq: Two times a day (BID) | ORAL | 1 refills | Status: AC

## 2024-11-13 MED ORDER — ARIPIPRAZOLE 5 MG PO TABS: 5.0000 mg | ORAL_TABLET | Freq: Every day | ORAL | 1 refills | Status: AC

## 2024-11-13 MED ORDER — ALPRAZOLAM 0.25 MG PO TABS: 0.2500 mg | ORAL_TABLET | Freq: Three times a day (TID) | ORAL | 0 refills | Status: AC | PRN

## 2024-11-13 NOTE — Progress Notes (Signed)
 [name] [id number] February 08, 1956 68 y.o.  Subjective:   Patient ID:  [name] is a 68 y.o. (DOB 28-Jun-1956) female.  Chief Complaint: No chief complaint on file.   HPI [name] presents to the office today for follow-up of anxiety, depression and insomnia.  Describes mood today as ok. Pleasant. Tearful at times. Mood symptoms - reports some situational depression, anxiety and irritability. Reports lower interest and motivation. Reports some worry, rumination and over thinking. Reports situational stressors - mother and husband with health issues. Reports mood is variable. Stating I have been more down over the past few weeks. Feels like medications are helpful, but is willing to consider other options. Taking medications as prescribed.  Energy levels lower - feels fatigued. Active, does not have a regular exercise. Enjoys some usual interests and activities. Married. Lives with husband. Has 2 sons - one local and another one in [place]. Mother local - 77 years old. Spending time with family. Appetite adequate. Weight . Sleeps better some nights than others. Averages 5 to 6 hours. Denies daytime napping. Using CPAP machine. Focus and concentration stable. Completing tasks. Managing aspects of household. Retired. Denies SI or HI.  Denies AH or VH Denies self harm. Denies substance use.  Bariatric surgery 11/20.   PHQ2-9    Flowsheet Row Office Visit from 09/17/2024 in [facility] Office Visit from 08/28/2023 in [facility] Office Visit from 11/05/2022 in [facility] [place]. [facility] Office Visit from 09/25/2022 in [facility] [place]. [facility] Office Visit from 07/26/2022 in [facility]  PHQ-2 Total Score 0 0 1 0 2  PHQ-9 Total Score -- 4 -- -- 9   Flowsheet Row IV Medication 120 from 10/09/2024 in [facility] at [facility] ED  from 10/02/2024 in [facility] Department at [facility] IV Medication 120 from 09/09/2023 in [facility] PROCEDURAL SHORT STAY  C-SSRS RISK CATEGORY No Risk No Risk No Risk     Review of Systems:  Review of Systems  Musculoskeletal:  Negative for gait problem.  Neurological:  Negative for tremors.  Psychiatric/Behavioral:         Please refer to HPI    Medications: I have reviewed the patient's current medications.  Current Outpatient Medications  Medication Sig Dispense Refill   acetaminophen  (TYLENOL ) 500 MG tablet Take 1,000 mg by mouth every 6 (six) hours as needed.     albuterol  (VENTOLIN  HFA) 108 (90 Base) MCG/ACT inhaler TAKE 2 PUFFS BY MOUTH EVERY 6 HOURS AS NEEDED FOR WHEEZE OR SHORTNESS OF BREATH 8.5 each 0   allopurinol  (ZYLOPRIM ) 300 MG tablet Take 1 tablet (300 mg total) by mouth daily. 90 tablet 3   ALPRAZolam  (XANAX ) 0.25 MG tablet TAKE 1 TABLET BY MOUTH THREE TIMES A DAY AS NEEDED FOR ANXIETY 90 tablet 0   ARIPiprazole  (ABILIFY ) 5 MG tablet Take 1 tablet (5 mg total) by mouth daily. 90 tablet 1   Armodafinil  250 MG tablet TAKE 1 TABLET BY MOUTH ONCE DAILY AS NEEDED 30 tablet 0   aspirin  EC 81 MG tablet Take 81 mg by mouth in the morning. Swallow whole.     atorvastatin  (LIPITOR) 40 MG tablet Take 1 tablet (40 mg total) by mouth daily. 90 tablet 3   Azelastine HCl (ASTEPRO NA) Place 1 spray into the nose 2 (two) times  daily.     CALCIUM  PO Take 500 mg by mouth in the morning, at noon, and at bedtime.     colchicine  0.6 MG tablet Take 1 tablet (0.6 mg total) by mouth as needed. 30 tablet 0   DULoxetine  (CYMBALTA ) 60 MG capsule Take 1 capsule (60 mg total) by mouth 2 (two) times daily. 180 capsule 1   fluticasone  (FLONASE ) 50 MCG/ACT nasal spray SPRAY 2 SPRAYS INTO EACH NOSTRIL EVERY DAY 48 mL 0   HYDROcodone -acetaminophen  (NORCO/VICODIN) 5-325 MG tablet as needed.     hypromellose (SYSTANE NIGHT) 0.3 % GEL ophthalmic ointment Place 1  Application into both eyes at bedtime as needed for dry eyes.     loratadine  (CLARITIN ) 10 MG tablet Take 10 mg by mouth daily.     meclizine  (ANTIVERT ) 25 MG tablet Take 1 tablet (25 mg total) by mouth 3 (three) times daily as needed for dizziness. 30 tablet 0   methocarbamol  (ROBAXIN ) 500 MG tablet TAKE 1 TABLET BY MOUTH EVERY DAY AS NEEDED 30 tablet 2   metoprolol  tartrate (LOPRESSOR ) 50 MG tablet Take 1 tablet (50 mg total) by mouth 2 (two) times daily. 180 tablet 1   Multiple Vitamins-Minerals (PRESERVISION AREDS 2) CAPS Take 1 capsule by mouth in the morning and at bedtime.     mupirocin  ointment (BACTROBAN ) 2 % SMARTSIG:1 sparingly Topical Twice Daily     ondansetron  (ZOFRAN ) 8 MG tablet Take 8 mg by mouth every 8 (eight) hours as needed for nausea.  0   Oxcarbazepine  (TRILEPTAL ) 300 MG tablet Take 300 mg by mouth 2 (two) times daily.     pantoprazole  (PROTONIX ) 40 MG tablet Take 1 tablet (40 mg total) by mouth daily. 90 tablet 0   PRESCRIPTION MEDICATION CPAP- At bedtime     Probiotic Product (PROBIOTIC ADVANCED PO) Take 1 capsule by mouth at bedtime.     promethazine  (PHENERGAN ) 25 MG tablet Take 25 mg by mouth every 6 (six) hours as needed for nausea or vomiting.     RESTASIS  0.05 % ophthalmic emulsion Place 1 drop into both eyes in the morning and at bedtime.     SYSTANE ULTRA PF 0.4-0.3 % SOLN Place 1 drop into both eyes 3 (three) times daily as needed (for dryness).     traMADol  (ULTRAM ) 50 MG tablet Take 50 mg by mouth every 6 (six) hours as needed.     traZODone  (DESYREL ) 50 MG tablet Take 1-2 tablets (50-100 mg total) by mouth at bedtime. 180 tablet 0   Zoledronic  Acid (RECLAST  IV) Infusion: 09/09/2023     No current facility-administered medications for this visit.    Medication Side Effects: None  Allergies: Allergies[1]  Past Medical History:  Diagnosis Date   Anemia    previously followed by Dr. Valjean for anemia and elevated platelets   Anxiety    C. difficile  colitis 10/01/2012   treated by [facility]   Chronic fatigue syndrome    Closed wedge compression fracture of T8 vertebra (HCC) 06/2021   DDD (degenerative disc disease), lumbar 08/19/2014   and facet arthroplasty & left lumbar radiculopathy (Dr.Ramos)   Depression    Dyssynergia    dyssynergenic defecation, contributing to fecal incontinence.   Edema    Fibromyalgia    Gastroparesis    followed at [facility]   GERD (gastroesophageal reflux disease)    History of kidney stones    History of vertebral fracture 06/30/2021   Hyperlipidemia    Kidney stone    Lumbar  radiculopathy    Migraine    Neuropathy    Obstructive sleep apnea    Does  wear  CPAP   Osteoporosis    Paresthesia    Dr. Charlyne at [facility]   Pelvic floor dysfunction    pelvic floor dyssynergy   Plantar fasciitis 02/2011   R foot   Pneumonia    2012   PONV (postoperative nausea and vomiting)    pt states has gastroparesis has difficulty taking antibiotics and narcotics has severe nausea and vomiting    Restless leg syndrome    S/P endometrial ablation 08/09/2006   Novasure Ablation   S/P epidural steroid injection 09/20/2014   Dr.Ramos   Tremor    Dr. Charlyne   Urinary frequency    Urinary incontinence     Past Medical History, Surgical history, Social history, and Family history were reviewed and updated as appropriate.   Please see review of systems for further details on the patient's review from today.   Objective:   Physical Exam:  LMP 07/27/2006   Physical Exam Constitutional:      General: She is not in acute distress. Musculoskeletal:        General: No deformity.  Neurological:     Mental Status: She is alert and oriented to person, place, and time.     Coordination: Coordination normal.  Psychiatric:        Attention and Perception: Attention and perception normal. She does not perceive auditory or visual hallucinations.        Mood and Affect: Mood normal. Mood is not anxious or  depressed. Affect is not labile, blunt, angry or inappropriate.        Speech: Speech normal.        Behavior: Behavior normal.        Thought Content: Thought content normal. Thought content is not paranoid or delusional. Thought content does not include homicidal or suicidal ideation. Thought content does not include homicidal or suicidal plan.        Cognition and Memory: Cognition and memory normal.        Judgment: Judgment normal.     Comments: Insight intact     Lab Review:     Component Value Date/Time   NA 140 09/07/2024 1028   NA 143 03/11/2024 1022   K 4.5 09/07/2024 1028   CL 102 09/07/2024 1028   CO2 27 09/07/2024 1028   GLUCOSE 89 09/07/2024 1028   BUN 15 09/07/2024 1028   BUN 15 03/11/2024 1022   CREATININE 0.77 09/07/2024 1028   CALCIUM  9.4 09/07/2024 1028   PROT 6.9 09/07/2024 1028   PROT 6.1 03/11/2024 1022   ALBUMIN 3.8 (L) 03/11/2024 1022   AST 31 09/07/2024 1028   ALT 31 (H) 09/07/2024 1028   ALKPHOS 103 03/11/2024 1022   BILITOT 0.4 09/07/2024 1028   BILITOT <0.2 03/11/2024 1022   GFRNONAA >60 08/05/2022 0326   GFRNONAA 68 07/04/2020 1129   GFRAA 81 11/07/2020 1029   GFRAA 79 07/04/2020 1129       Component Value Date/Time   WBC 10.0 09/07/2024 1028   RBC 4.35 09/07/2024 1028   HGB 13.4 09/07/2024 1028   HGB 12.5 07/17/2022 1614   HGB 11.7 05/02/2011 1412   HCT 40.3 09/07/2024 1028   HCT 38.0 07/17/2022 1614   HCT 37.1 05/02/2011 1412   PLT 558 (H) 09/07/2024 1028   PLT 431 07/17/2022 1614   MCV 92.6 09/07/2024 1028   MCV 91 07/17/2022  1614   MCV 94.4 05/02/2011 1412   MCH 30.8 09/07/2024 1028   MCHC 33.3 09/07/2024 1028   RDW 13.2 09/07/2024 1028   RDW 13.2 07/17/2022 1614   RDW 18.3 (H) 05/02/2011 1412   LYMPHSABS 1,967 06/17/2023 0955   LYMPHSABS 3.2 (H) 07/17/2022 1614   LYMPHSABS 2.4 05/02/2011 1412   MONOABS 0.9 08/02/2022 0341   MONOABS 0.8 05/02/2011 1412   EOSABS 120 09/07/2024 1028   EOSABS 0.3 07/17/2022 1614   BASOSABS  110 09/07/2024 1028   BASOSABS 0.1 07/17/2022 1614   BASOSABS 0.1 05/02/2011 1412    No results found for: POCLITH, LITHIUM   No results found for: PHENYTOIN, PHENOBARB, VALPROATE, CBMZ   .res Assessment: Plan:    Plan:  Xanax  0.25mg  TID Cymbalta  60mg  BID Abilify  5mg  daily Trazadone 50mg  at bedtime.  RTC 6 months  20 minutes spent dedicated to the care of this patient on the date of this encounter to include pre-visit review of records, ordering of medication, post visit documentation, and face-to-face time with the patient discussing depression, anxiety, and insomnia. Discussed sleep issues and some different strategies to help.    Patient advised to contact office with any questions, adverse effects, or acute worsening in signs and symptoms.  Discussed potential metabolic side effects associated with atypical antipsychotics, as well as potential risk for movement side effects. Advised pt to contact office if movement side effects occur.   Discussed potential benefits, risk, and side effects of benzodiazepines to include potential risk of tolerance and dependence, as well as possible drowsiness.  Advised patient not to drive if experiencing drowsiness and to take lowest possible effective dose to minimize risk of dependence and tolerance.   There are no diagnoses linked to this encounter.   Please see After Visit Summary for patient specific instructions.  Future Appointments  Date Time Provider Department Center  11/13/2024 11:00 AM Maebell Lyvers, Angeline Mattocks, NP [facility] None  03/09/2025 11:00 AM Dolphus Reiter, MD [facility] None  03/18/2025  9:30 AM Randol Dawes, MD [facility] [phone] [place]    No orders of the defined types were placed in this encounter.   -------------------------------     [1]  Allergies Allergen Reactions   Erythromycin Nausea Only and Other (See Comments)    Abdominal pain, also   Meperidine  Hcl Other (See Comments)    Demerol - Made the  patient feel hot and her heart raced- made her feel flushed, also   Polyethyl Glyc-Propyl Glyc Pf Nausea And Vomiting and Other (See Comments)    RN confirmed with patient 9/4 (Miralax )   Polyethylene Glycol 3350  Other (See Comments)    Other Reaction(s): GI Intolerance  RN confirmed with patient 9/4 (Miralax )   Zolpidem  Other (See Comments)    Lost balance   Zonisamide Nausea Only   Dilaudid  [Hydromorphone  Hcl] Itching

## 2024-12-02 ENCOUNTER — Encounter: Payer: Self-pay | Admitting: Family Medicine

## 2024-12-02 ENCOUNTER — Ambulatory Visit: Admitting: Family Medicine

## 2024-12-02 VITALS — BP 130/84 | HR 70 | Temp 97.4°F

## 2024-12-02 DIAGNOSIS — J069 Acute upper respiratory infection, unspecified: Secondary | ICD-10-CM | POA: Diagnosis not present

## 2024-12-02 NOTE — Progress Notes (Signed)
 Chief Complaint  Patient presents with   Cough    Cough and congestion, ST and runny nose. Has had some body aches no fever. Right ear pain-symptoms started Sunday. Neg home flu and covid today.    11/29/24 she started with headache, sore throat, stuffy nose.  It hit her all of a sudden Sunday afternoon, and gradually got worse. She had some body aches on Sunday and Monday, where it hurt for her clothes to touch her. Felt a little better during the day yesterday, but last night her throat felt worse, couldn't sleep. She started with right ear pain last night. Headache has improved (R temple).  She has been taking plain mucinex  on 1/4, which is loosening things up. She uses chloraseptic spray (doesn't help much), and some tylenol  (last night and this morning).  Nasal drainage is yellowish throughout the day. Has a mild cough, nonproductive. Only occasionally notes some phlegm.  No fever/chills. She had diarrhea yesterday x 4-5 episodes, watery. Some increased nausea, no vomiting (some heaves). Denies abdominal pain.  No known sick contacts.  Grandson always has a yucky nose  She took a COVID and flu test at home this morning.    PMH, PSH, SH reviewed  Outpatient Encounter Medications as of 12/02/2024  Medication Sig Note   acetaminophen  (TYLENOL ) 500 MG tablet Take 1,000 mg by mouth every 6 (six) hours as needed. 12/02/2024: 1000mg  8am   albuterol  (VENTOLIN  HFA) 108 (90 Base) MCG/ACT inhaler TAKE 2 PUFFS BY MOUTH EVERY 6 HOURS AS NEEDED FOR WHEEZE OR SHORTNESS OF BREATH 12/02/2024: As needed   allopurinol  (ZYLOPRIM ) 300 MG tablet Take 1 tablet (300 mg total) by mouth daily.    ALPRAZolam  (XANAX ) 0.25 MG tablet Take 1 tablet (0.25 mg total) by mouth 3 (three) times daily as needed for anxiety. 12/02/2024: Most days, not today   ARIPiprazole  (ABILIFY ) 5 MG tablet Take 1 tablet (5 mg total) by mouth daily.    Armodafinil  250 MG tablet TAKE 1 TABLET BY MOUTH ONCE DAILY AS NEEDED    aspirin  EC  81 MG tablet Take 81 mg by mouth in the morning. Swallow whole.    atorvastatin  (LIPITOR) 40 MG tablet Take 1 tablet (40 mg total) by mouth daily.    Azelastine HCl (ASTEPRO NA) Place 1 spray into the nose 2 (two) times daily. 12/02/2024: As needed   CALCIUM  PO Take 500 mg by mouth in the morning, at noon, and at bedtime.    colchicine  0.6 MG tablet Take 1 tablet (0.6 mg total) by mouth as needed. 12/02/2024: As needed   DULoxetine  (CYMBALTA ) 60 MG capsule Take 1 capsule (60 mg total) by mouth 2 (two) times daily.    fluticasone  (FLONASE ) 50 MCG/ACT nasal spray SPRAY 2 SPRAYS INTO EACH NOSTRIL EVERY DAY    guaiFENesin  (MUCINEX ) 600 MG 12 hr tablet Take 1 tablet by mouth 2 (two) times daily. 12/02/2024: Last dose 10am   loratadine  (CLARITIN ) 10 MG tablet Take 10 mg by mouth daily.    meclizine  (ANTIVERT ) 25 MG tablet Take 1 tablet (25 mg total) by mouth 3 (three) times daily as needed for dizziness. 12/02/2024: As needed   methocarbamol  (ROBAXIN ) 500 MG tablet TAKE 1 TABLET BY MOUTH EVERY DAY AS NEEDED 12/02/2024: As needed   metoprolol  tartrate (LOPRESSOR ) 50 MG tablet Take 1 tablet (50 mg total) by mouth 2 (two) times daily.    Multiple Vitamins-Minerals (PRESERVISION AREDS 2) CAPS Take 1 capsule by mouth in the morning and at bedtime.  ondansetron  (ZOFRAN ) 8 MG tablet Take 8 mg by mouth every 8 (eight) hours as needed for nausea. 12/02/2024: As needed   Oxcarbazepine  (TRILEPTAL ) 300 MG tablet Take 300 mg by mouth 2 (two) times daily.    oxyCODONE -acetaminophen  (PERCOCET ) 10-325 MG tablet Take 1 tablet by mouth 3 (three) times daily as needed. 12/02/2024: As needed   pantoprazole  (PROTONIX ) 40 MG tablet Take 1 tablet (40 mg total) by mouth daily. 03/04/2024: qhs   PRESCRIPTION MEDICATION CPAP- At bedtime    Probiotic Product (PROBIOTIC ADVANCED PO) Take 1 capsule by mouth at bedtime.    promethazine  (PHENERGAN ) 25 MG tablet Take 25 mg by mouth every 6 (six) hours as needed for nausea or vomiting. 12/02/2024: As  needed   RESTASIS  0.05 % ophthalmic emulsion Place 1 drop into both eyes in the morning and at bedtime.    SYSTANE ULTRA PF 0.4-0.3 % SOLN Place 1 drop into both eyes 3 (three) times daily as needed (for dryness).    traMADol  (ULTRAM ) 50 MG tablet Take 50 mg by mouth every 6 (six) hours as needed. 12/02/2024: As needed, rarely (makes her itch)     traZODone  (DESYREL ) 50 MG tablet Take 1-2 tablets (50-100 mg total) by mouth at bedtime. 12/02/2024: 100qhs   TURMERIC PO Take 3,000 mg by mouth daily.    Zoledronic  Acid (RECLAST  IV) Infusion: 09/09/2023    [DISCONTINUED] mupirocin  ointment (BACTROBAN ) 2 % SMARTSIG:1 sparingly Topical Twice Daily 09/17/2024: As needed   [DISCONTINUED] HYDROcodone -acetaminophen  (NORCO/VICODIN) 5-325 MG tablet as needed. 09/17/2024: As needed, takes when HA is bad (last dose Tuesday)    [DISCONTINUED] hypromellose (SYSTANE NIGHT) 0.3 % GEL ophthalmic ointment Place 1 Application into both eyes at bedtime as needed for dry eyes.    No facility-administered encounter medications on file as of 12/02/2024.   Hasn't taken any percocet  or other pain meds in a while.  Allergies  Allergen Reactions   Erythromycin Nausea Only and Other (See Comments)    Abdominal pain, also   Meperidine  Hcl Other (See Comments)    Demerol - Made the patient feel hot and her heart raced- made her feel flushed, also   Polyethyl Glyc-Propyl Glyc Pf Nausea And Vomiting and Other (See Comments)    RN confirmed with patient 9/4 (Miralax )   Polyethylene Glycol 3350  Other (See Comments)    Other Reaction(s): GI Intolerance  RN confirmed with patient 9/4 (Miralax )   Zolpidem  Other (See Comments)    Lost balance   Zonisamide Nausea Only   Dilaudid  [Hydromorphone  Hcl] Itching    ROS: URI symptoms per HPI. No fever/chills. No chest pain, shortness of breath. Recent watery diarrhea, nausea, some heaving, no vomiting. No abdominal pain. No urinary complaints No bleeding, rash. See  HPI    PHYSICAL EXAM:  BP 130/84   Pulse 70   Temp (!) 97.4 F (36.3 C) (Tympanic)   LMP 07/27/2006   Wt Readings from Last 3 Encounters:  09/17/24 185 lb 12.8 oz (84.3 kg)  09/07/24 183 lb 9.6 oz (83.3 kg)  08/24/24 187 lb (84.8 kg)   Frequent sniffling during visit. She appeared comfortable, in no distress, speaking easily. HEENT: conjunctiva and sclera are clear, EOMI. R TM is normal, slightly retracted.  No erythema. L TM and EAC is normal. Nasal mucosa is mildly edematous. Some yellow crusting noted in R nares, and some whitish mucus present on the left. She is mildly tender at the R maxillary sinus and over the R temporalis muscles. OP is clear Neck: no  lymphadenopathy or mass Heart: regular rate and rhythm Lungs: clear bilaterally Neuro: alert and oriented, cranial nerves grossly intact, normal gait Psych: normal mood, affect, hygiene and grooming.     ASSESSMENT/PLAN:  Viral upper respiratory illness - supportive measures reviewed.  s/sx bacterial infection discussed, f/u if sx persist/worsen   Stay well hydrated. Continue the mucinex  as needed (for any thick mucus or cough). Add dextromethorphan  (ie delsym  syrup or the mucinex  DM) only if cough is bothersome. Salt water  gargles as needed for throat pain, as well as tylenol  as needed.  I encourage you to try sinus rinses (sinus rinse kit, using either distilled or boiled water  to make the saline), once or twice daily.   If the mucus stays discolored, or gets worse, let us  know, especially if you have worsening sinus pain. This could indicate a bacterial infection. Currently, it just seems like a viral infection, with no bacterial sinus infection yet. Continue a bland diet, and avoid dairy until your stomach seems better. Contact us  if you develop a fever, or worsening symptoms.

## 2024-12-02 NOTE — Patient Instructions (Signed)
 Stay well hydrated. Continue the mucinex  as needed (for any thick mucus or cough). Add dextromethorphan  (ie delsym  syrup or the mucinex  DM) only if cough is bothersome. Salt water  gargles as needed for throat pain, as well as tylenol  as needed.  I encourage you to try sinus rinses (sinus rinse kit, using either distilled or boiled water  to make the saline), once or twice daily.   If the mucus stays discolored, or gets worse, let us  know, especially if you have worsening sinus pain. This could indicate a bacterial infection. Currently, it just seems like a viral infection, with no bacterial sinus infection yet. Continue a bland diet, and avoid dairy until your stomach seems better. Contact us  if you develop a fever, or worsening symptoms.

## 2024-12-03 ENCOUNTER — Encounter: Payer: Self-pay | Admitting: Family Medicine

## 2024-12-09 ENCOUNTER — Other Ambulatory Visit: Payer: Self-pay | Admitting: Physician Assistant

## 2024-12-09 DIAGNOSIS — M797 Fibromyalgia: Secondary | ICD-10-CM

## 2024-12-09 DIAGNOSIS — R5383 Other fatigue: Secondary | ICD-10-CM

## 2024-12-09 NOTE — Telephone Encounter (Signed)
 Last Fill: 11/09/2024  Next Visit: 03/09/2025  Last Visit: 09/07/2024  Dx: Fibromyalgia   Current Dose per office note on 09/07/2024: She takes armodafinil  as prescribed.   Okay to refill Armodafinil ?

## 2025-03-09 ENCOUNTER — Ambulatory Visit: Payer: Self-pay | Admitting: Rheumatology

## 2025-03-18 ENCOUNTER — Ambulatory Visit: Admitting: Family Medicine

## 2025-05-14 ENCOUNTER — Ambulatory Visit: Admitting: Adult Health

## 2025-09-30 ENCOUNTER — Encounter: Admitting: Family Medicine
